# Patient Record
Sex: Male | Born: 1935
Health system: Southern US, Community
[De-identification: ages and names within clinical notes are randomized; demographics above are authoritative.]

## PROBLEM LIST (undated history)

## (undated) DIAGNOSIS — I714 Abdominal aortic aneurysm, without rupture, unspecified: Secondary | ICD-10-CM

## (undated) DIAGNOSIS — Z9289 Personal history of other medical treatment: Secondary | ICD-10-CM

## (undated) DIAGNOSIS — Z87442 Personal history of urinary calculi: Secondary | ICD-10-CM

## (undated) DIAGNOSIS — D696 Thrombocytopenia, unspecified: Secondary | ICD-10-CM

## (undated) DIAGNOSIS — I10 Essential (primary) hypertension: Secondary | ICD-10-CM

## (undated) DIAGNOSIS — G4733 Obstructive sleep apnea (adult) (pediatric): Principal | ICD-10-CM

## (undated) DIAGNOSIS — I480 Paroxysmal atrial fibrillation: Secondary | ICD-10-CM

## (undated) DIAGNOSIS — N200 Calculus of kidney: Secondary | ICD-10-CM

## (undated) DIAGNOSIS — I779 Disorder of arteries and arterioles, unspecified: Secondary | ICD-10-CM

## (undated) DIAGNOSIS — IMO0002 Reserved for concepts with insufficient information to code with codable children: Secondary | ICD-10-CM

## (undated) DIAGNOSIS — I739 Peripheral vascular disease, unspecified: Secondary | ICD-10-CM

## (undated) DIAGNOSIS — M1990 Unspecified osteoarthritis, unspecified site: Secondary | ICD-10-CM

## (undated) DIAGNOSIS — L299 Pruritus, unspecified: Secondary | ICD-10-CM

## (undated) DIAGNOSIS — R9430 Abnormal result of cardiovascular function study, unspecified: Secondary | ICD-10-CM

## (undated) DIAGNOSIS — Z951 Presence of aortocoronary bypass graft: Secondary | ICD-10-CM

## (undated) DIAGNOSIS — H9190 Unspecified hearing loss, unspecified ear: Secondary | ICD-10-CM

## (undated) DIAGNOSIS — I639 Cerebral infarction, unspecified: Secondary | ICD-10-CM

## (undated) DIAGNOSIS — K219 Gastro-esophageal reflux disease without esophagitis: Secondary | ICD-10-CM

## (undated) DIAGNOSIS — E785 Hyperlipidemia, unspecified: Secondary | ICD-10-CM

## (undated) DIAGNOSIS — J189 Pneumonia, unspecified organism: Secondary | ICD-10-CM

## (undated) DIAGNOSIS — I2510 Atherosclerotic heart disease of native coronary artery without angina pectoris: Secondary | ICD-10-CM

## (undated) DIAGNOSIS — J449 Chronic obstructive pulmonary disease, unspecified: Secondary | ICD-10-CM

## (undated) DIAGNOSIS — C61 Malignant neoplasm of prostate: Secondary | ICD-10-CM

## (undated) DIAGNOSIS — R5383 Other fatigue: Secondary | ICD-10-CM

## (undated) DIAGNOSIS — E119 Type 2 diabetes mellitus without complications: Secondary | ICD-10-CM

## (undated) DIAGNOSIS — R42 Dizziness and giddiness: Secondary | ICD-10-CM

## (undated) HISTORY — PX: HERNIA REPAIR: SHX51

## (undated) HISTORY — DX: Abdominal aortic aneurysm, without rupture, unspecified: I71.40

## (undated) HISTORY — DX: Thrombocytopenia, unspecified: D69.6

## (undated) HISTORY — DX: Paroxysmal atrial fibrillation: I48.0

## (undated) HISTORY — DX: Atherosclerotic heart disease of native coronary artery without angina pectoris: I25.10

## (undated) HISTORY — PX: ABDOMINAL AORTIC ANEURYSM REPAIR: SUR1152

## (undated) HISTORY — DX: Pruritus, unspecified: L29.9

## (undated) HISTORY — DX: Cerebral infarction, unspecified: I63.9

## (undated) HISTORY — DX: Other disorders of bilirubin metabolism: E80.6

## (undated) HISTORY — DX: Hyperlipidemia, unspecified: E78.5

## (undated) HISTORY — DX: Other fatigue: R53.83

## (undated) HISTORY — DX: Peripheral vascular disease, unspecified: I73.9

## (undated) HISTORY — PX: PROSTATE BIOPSY: SHX241

## (undated) HISTORY — DX: Dizziness and giddiness: R42

## (undated) HISTORY — DX: Abnormal result of cardiovascular function study, unspecified: R94.30

## (undated) HISTORY — DX: Reserved for concepts with insufficient information to code with codable children: IMO0002

## (undated) HISTORY — DX: Malignant neoplasm of prostate: C61

## (undated) HISTORY — DX: Chronic obstructive pulmonary disease, unspecified: J44.9

## (undated) HISTORY — PX: OTHER SURGICAL HISTORY: SHX169

## (undated) HISTORY — PX: FEMORAL ARTERY ANEURYSM REPAIR: SUR1157

## (undated) HISTORY — DX: Essential (primary) hypertension: I10

## (undated) HISTORY — DX: Obstructive sleep apnea (adult) (pediatric): G47.33

## (undated) HISTORY — DX: Abdominal aortic aneurysm, without rupture: I71.4

## (undated) HISTORY — PX: UMBILICAL HERNIA REPAIR: SHX196

## (undated) HISTORY — DX: Disorder of arteries and arterioles, unspecified: I77.9

## (undated) HISTORY — DX: Presence of aortocoronary bypass graft: Z95.1

---
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
## 1999-06-05 ENCOUNTER — Encounter: Payer: Self-pay | Admitting: Vascular Surgery

## 1999-06-06 ENCOUNTER — Encounter: Payer: Self-pay | Admitting: Vascular Surgery

## 1999-06-06 ENCOUNTER — Ambulatory Visit: Admission: RE | Admit: 1999-06-06 | Discharge: 1999-06-06 | Payer: Self-pay | Admitting: Vascular Surgery

## 1999-06-26 ENCOUNTER — Encounter (INDEPENDENT_AMBULATORY_CARE_PROVIDER_SITE_OTHER): Payer: Self-pay | Admitting: Specialist

## 1999-06-26 ENCOUNTER — Inpatient Hospital Stay: Admission: RE | Admit: 1999-06-26 | Discharge: 1999-06-30 | Payer: Self-pay | Admitting: Vascular Surgery

## 1999-06-26 ENCOUNTER — Encounter: Payer: Self-pay | Admitting: Vascular Surgery

## 1999-06-26 ENCOUNTER — Encounter: Payer: Self-pay | Admitting: Anesthesiology

## 1999-08-16 ENCOUNTER — Other Ambulatory Visit: Admission: RE | Admit: 1999-08-16 | Discharge: 1999-08-16 | Payer: Self-pay | Admitting: Urology

## 2000-03-11 ENCOUNTER — Encounter: Admission: RE | Admit: 2000-03-11 | Discharge: 2000-06-09 | Payer: Self-pay | Admitting: Radiation Oncology

## 2001-12-23 ENCOUNTER — Inpatient Hospital Stay (HOSPITAL_COMMUNITY): Admission: EM | Admit: 2001-12-23 | Discharge: 2001-12-31 | Payer: Self-pay | Admitting: Emergency Medicine

## 2001-12-23 ENCOUNTER — Encounter (INDEPENDENT_AMBULATORY_CARE_PROVIDER_SITE_OTHER): Payer: Self-pay | Admitting: *Deleted

## 2001-12-23 ENCOUNTER — Encounter: Payer: Self-pay | Admitting: Cardiology

## 2001-12-23 HISTORY — PX: CHOLECYSTECTOMY: SHX55

## 2001-12-23 HISTORY — PX: ERCP W/ METAL STENT PLACEMENT: SHX1521

## 2001-12-25 ENCOUNTER — Encounter: Payer: Self-pay | Admitting: Cardiology

## 2002-01-14 ENCOUNTER — Ambulatory Visit (HOSPITAL_COMMUNITY): Admission: RE | Admit: 2002-01-14 | Discharge: 2002-01-14 | Payer: Self-pay | Admitting: Gastroenterology

## 2002-01-14 ENCOUNTER — Encounter: Payer: Self-pay | Admitting: Gastroenterology

## 2002-02-08 ENCOUNTER — Encounter: Payer: Self-pay | Admitting: Surgery

## 2002-02-08 ENCOUNTER — Ambulatory Visit (HOSPITAL_COMMUNITY): Admission: RE | Admit: 2002-02-08 | Discharge: 2002-02-08 | Payer: Self-pay | Admitting: Surgery

## 2002-02-09 ENCOUNTER — Ambulatory Visit (HOSPITAL_COMMUNITY): Admission: RE | Admit: 2002-02-09 | Discharge: 2002-02-09 | Payer: Self-pay | Admitting: Gastroenterology

## 2002-02-09 ENCOUNTER — Encounter: Payer: Self-pay | Admitting: Gastroenterology

## 2002-02-12 ENCOUNTER — Encounter: Payer: Self-pay | Admitting: Gastroenterology

## 2002-02-12 ENCOUNTER — Encounter: Payer: Self-pay | Admitting: Emergency Medicine

## 2002-02-12 ENCOUNTER — Inpatient Hospital Stay (HOSPITAL_COMMUNITY): Admission: EM | Admit: 2002-02-12 | Discharge: 2002-02-15 | Payer: Self-pay | Admitting: Emergency Medicine

## 2002-04-13 ENCOUNTER — Ambulatory Visit (HOSPITAL_COMMUNITY): Admission: RE | Admit: 2002-04-13 | Discharge: 2002-04-13 | Payer: Self-pay | Admitting: Gastroenterology

## 2002-07-25 ENCOUNTER — Ambulatory Visit (HOSPITAL_COMMUNITY): Admission: RE | Admit: 2002-07-25 | Discharge: 2002-07-25 | Payer: Self-pay | Admitting: Gastroenterology

## 2002-07-25 ENCOUNTER — Encounter (INDEPENDENT_AMBULATORY_CARE_PROVIDER_SITE_OTHER): Payer: Self-pay | Admitting: Specialist

## 2002-09-25 HISTORY — PX: INCISIONAL HERNIA REPAIR: SHX193

## 2002-10-12 ENCOUNTER — Encounter: Payer: Self-pay | Admitting: Surgery

## 2002-10-17 ENCOUNTER — Inpatient Hospital Stay (HOSPITAL_COMMUNITY): Admission: RE | Admit: 2002-10-17 | Discharge: 2002-10-20 | Payer: Self-pay | Admitting: Surgery

## 2004-08-25 HISTORY — PX: INGUINAL HERNIA REPAIR: SUR1180

## 2004-08-29 ENCOUNTER — Observation Stay (HOSPITAL_COMMUNITY): Admission: RE | Admit: 2004-08-29 | Discharge: 2004-08-30 | Payer: Self-pay | Admitting: Surgery

## 2006-03-28 ENCOUNTER — Emergency Department (HOSPITAL_COMMUNITY): Admission: EM | Admit: 2006-03-28 | Discharge: 2006-03-28 | Payer: Self-pay | Admitting: Emergency Medicine

## 2007-08-26 HISTORY — PX: MEDIAL PARTIAL KNEE REPLACEMENT: SHX5965

## 2008-10-15 ENCOUNTER — Emergency Department (HOSPITAL_COMMUNITY): Admission: EM | Admit: 2008-10-15 | Discharge: 2008-10-15 | Payer: Self-pay | Admitting: *Deleted

## 2009-04-10 ENCOUNTER — Encounter: Admission: RE | Admit: 2009-04-10 | Discharge: 2009-05-24 | Payer: Self-pay | Admitting: Family Medicine

## 2010-04-08 ENCOUNTER — Emergency Department (HOSPITAL_COMMUNITY): Admission: EM | Admit: 2010-04-08 | Discharge: 2010-04-08 | Payer: Self-pay | Admitting: Emergency Medicine

## 2010-10-01 ENCOUNTER — Other Ambulatory Visit: Payer: Self-pay | Admitting: Gastroenterology

## 2010-11-08 LAB — URINALYSIS, ROUTINE W REFLEX MICROSCOPIC
Leukocytes, UA: NEGATIVE
Nitrite: NEGATIVE
Protein, ur: 30 mg/dL — AB

## 2010-11-08 LAB — URINE MICROSCOPIC-ADD ON

## 2010-11-08 LAB — DIFFERENTIAL
Basophils Absolute: 0 10*3/uL (ref 0.0–0.1)
Basophils Relative: 0 % (ref 0–1)
Eosinophils Absolute: 0.1 10*3/uL (ref 0.0–0.7)
Lymphocytes Relative: 11 % — ABNORMAL LOW (ref 12–46)

## 2010-11-08 LAB — POCT I-STAT, CHEM 8
BUN: 19 mg/dL (ref 6–23)
Creatinine, Ser: 1.1 mg/dL (ref 0.4–1.5)
Sodium: 141 mEq/L (ref 135–145)

## 2010-11-08 LAB — CBC
Hemoglobin: 15.5 g/dL (ref 13.0–17.0)
MCH: 32 pg (ref 26.0–34.0)
MCHC: 34.7 g/dL (ref 30.0–36.0)
MCV: 92.4 fL (ref 78.0–100.0)
RBC: 4.84 MIL/uL (ref 4.22–5.81)
RDW: 13.7 % (ref 11.5–15.5)

## 2010-11-13 ENCOUNTER — Emergency Department (HOSPITAL_COMMUNITY)
Admission: EM | Admit: 2010-11-13 | Discharge: 2010-11-13 | Disposition: A | Discharge status: Home / Self Care | Payer: Medicare Other | Attending: Emergency Medicine | Admitting: Emergency Medicine

## 2010-11-13 DIAGNOSIS — L270 Generalized skin eruption due to drugs and medicaments taken internally: Secondary | ICD-10-CM | POA: Insufficient documentation

## 2010-11-13 DIAGNOSIS — L2989 Other pruritus: Secondary | ICD-10-CM | POA: Insufficient documentation

## 2010-11-13 DIAGNOSIS — L298 Other pruritus: Secondary | ICD-10-CM | POA: Insufficient documentation

## 2010-11-13 DIAGNOSIS — T466X5A Adverse effect of antihyperlipidemic and antiarteriosclerotic drugs, initial encounter: Secondary | ICD-10-CM | POA: Insufficient documentation

## 2010-11-13 DIAGNOSIS — Z87891 Personal history of nicotine dependence: Secondary | ICD-10-CM | POA: Insufficient documentation

## 2010-11-13 DIAGNOSIS — R21 Rash and other nonspecific skin eruption: Secondary | ICD-10-CM | POA: Insufficient documentation

## 2010-11-28 ENCOUNTER — Encounter: Payer: Self-pay | Admitting: Nurse Practitioner

## 2010-11-28 DIAGNOSIS — I10 Essential (primary) hypertension: Secondary | ICD-10-CM

## 2010-11-28 DIAGNOSIS — E119 Type 2 diabetes mellitus without complications: Secondary | ICD-10-CM | POA: Insufficient documentation

## 2010-11-28 DIAGNOSIS — R42 Dizziness and giddiness: Secondary | ICD-10-CM

## 2010-11-28 DIAGNOSIS — Z8546 Personal history of malignant neoplasm of prostate: Secondary | ICD-10-CM

## 2010-12-10 LAB — DIFFERENTIAL
Basophils Relative: 0 % (ref 0–1)
Eosinophils Absolute: 0.1 10*3/uL (ref 0.0–0.7)
Lymphocytes Relative: 8 % — ABNORMAL LOW (ref 12–46)
Lymphs Abs: 0.8 10*3/uL (ref 0.7–4.0)
Monocytes Absolute: 0.8 10*3/uL (ref 0.1–1.0)

## 2010-12-10 LAB — URINE CULTURE: Colony Count: >=100000

## 2010-12-10 LAB — URINE MICROSCOPIC-ADD ON

## 2010-12-10 LAB — URINALYSIS, ROUTINE W REFLEX MICROSCOPIC
Glucose, UA: 100 mg/dL — AB
Hgb urine dipstick: NEGATIVE
Nitrite: NEGATIVE
Protein, ur: NEGATIVE mg/dL

## 2010-12-10 LAB — BASIC METABOLIC PANEL
CO2: 29 mEq/L (ref 19–32)
Calcium: 9.8 mg/dL (ref 8.4–10.5)
Chloride: 105 mEq/L (ref 96–112)
Potassium: 4.1 mEq/L (ref 3.5–5.1)
Sodium: 138 mEq/L (ref 135–145)

## 2010-12-10 LAB — CBC
HCT: 44.6 % (ref 39.0–52.0)
MCHC: 35.2 g/dL (ref 30.0–36.0)
Platelets: 110 10*3/uL — ABNORMAL LOW (ref 150–400)
RDW: 13 % (ref 11.5–15.5)

## 2011-01-10 NOTE — Op Note (Signed)
   NAME:  [name], [name]                          ACCOUNT NO.:  [id number]   MEDICAL RECORD NO.:  [id number]                   PATIENT TYPE:  AMB   LOCATION:  ENDO                                 FACILITY:  [facility]   PHYSICIAN:  Petra Kuba, M.D.                 DATE OF BIRTH:  [birth year]   DATE OF PROCEDURE:  04/13/2002  DATE OF DISCHARGE:  04/13/2002                                 OPERATIVE REPORT   PROCEDURE:  Esophagogastroduodenoscopy with the side-viewing scope and stent  removal.   INDICATIONS:  Patient with bile leak, status post biliary stenting.  I just  want to remove stent and see how he does.  Consent was signed after risks,  benefits, methods, and options thoroughly discussed by me on multiple  occasions with both the patient and his other family members as well as by  Dr. Gerrit Friends.   MEDICATIONS:  Demerol 60 mg, Versed 7 mg.   DESCRIPTION OF PROCEDURE:  The side-viewing therapeutic video duodenoscope  was inserted by indirect vision into the stomach in the customary fashion  and advanced through a normal antrum, normal pylorus, into a normal  duodenum, and the properly-positioned stent was pulled into view.  It did  seem to be semi-clogged, but bile was draining around it and mildly through  it as well.  We went ahead and snared it in the customary fashion.  The  stent was withdrawn through the bile duct, could not be, based on the angle  we snared it, withdrawn into the scope, so we elected to withdraw the stent,  snare, and scope all in one.  The stent was recovered.  The patient  tolerated the procedure well.  There was no obvious immediate complication.   ENDOSCOPIC DIAGNOSES:  1. Stent in seemingly normal position, questionably partially clogged,     status post snare removal.  2. No obvious other findings using the side-viewing ERCP scope.   PLAN:  Observe for delayed complications.  Will review customary warnings.  Otherwise, follow up in my office in two to  three months to recheck symptoms  and discuss colonic screening, which he was initially referred to me in the  first place for, just prior to his gallbladder incident.                                               Petra Kuba, M.D.    MEM/MEDQ  D:  04/13/2002  T:  04/16/2002  Job:  [id number]   cc:   Velora Heckler, M.D.  Fax: [phone]   Ernestina Penna, M.D.

## 2011-01-10 NOTE — Procedures (Signed)
[facility]  Patient:    [name], [name] Visit Number: [id number] MRN: [id number]          Service Type: END Location: ENDO Attending Physician:  Nelda Marseille Dictated by:   Petra Kuba, M.D. Proc. Date: 02/09/02 Admit Date:  02/09/2002   CC:         Velora Heckler, M.D.   Procedure Report  PROCEDURE:  Stent change.  INDICATION:  The patient with persistent bile leak with x-ray findings of stent migration.  Consent was signed after risks, benefits, methods, and options thoroughly discussed with Mr. Hackler in the past.  MEDICATIONS:  Demerol 100, Versed 9.  DESCRIPTION OF PROCEDURE:  The side-viewing video therapeutic duodenoscope was inserted by indirect vision into the stomach and advanced through a normal antrum, normal pylorus, into the duodenum, and the old stent was brought into view.  It had migrated distally with a half or more in the duodenum.  We went ahead and snared it and withdrew it through the scope.  Using the triple-lumen sphincterotome, we proceeded with cannulation.  On the first injection, we had a normal-appearing PD.  Once we realized it was the PD, we stopped the injection.  The sphincterotome was repositioned.  Selective CBD cannulation was obtained using JAG Jagwire, we were able to get deep, selective cannulation.  We did not overfill the duct but, on some injection of contrast, the CBD appeared normal, and we could not demonstrate the leak.  We went ahead and removed the sphincterotome, and over the JAG Jagwire in the customary fashion, placed an 11.5 French 7 cm stent, using the Oasis system in the customary fashion with good positioning both endoscopically and fluoroscopically of the stent.  The introducer and wire were removed.  There was good drainage seen.  The scope was removed.  The patient tolerated the procedure well.  There was no obvious immediate complication.  ENDOSCOPIC DIAGNOSES: 1. Stent  slight distal migration, removed with the snare. 2. Normal ampulla pancreas divisum on one injection. 3. Normal common bile duct without obvious leak but not overfilled the bile    duct, and intrahepatics not filled. 4. Status post stent placement using the 11.5 French 7 cm plastic stent using    the Oasis system.  PLAN:  Observe for delayed complications.  If none, and drainage decreases, drain removed per Dr. Gerrit Friends and then remove stent in roughly six weeks. Happy to see back sooner p.r.n., and the customary warnings will be discussed with he and his wife. Dictated by:   Petra Kuba, M.D. Attending Physician:  Nelda Marseille DD:  02/09/02 TD:  02/10/02 Job: [id number]

## 2011-01-10 NOTE — H&P (Signed)
[place]  Patient:    [name], [name] Visit Number: [id number] MRN: [id number]          Service Type: MED Location: [phone] Attending Physician:  Nelda Marseille Dictated by:   Verlin Grills, M.D. Admit Date:  02/12/2002 Discharge Date: 02/15/2002   CC:         Velora Heckler, M.D.  Petra Kuba, M.D.   History and Physical  ADMISSION PROBLEM:  Post ERCP pancreatitis plus/minus cholangitis.  HISTORY:  The patient is a 77 year old male born July 24, 1933.  On Dec 26, 2001 the patient underwent a laparotomy with open cholecystectomy to remove a gangrenous gallbladder, filled with stones; the procedure was complicated by a bile leak, which was controlled with placement of a Jackson-Pratt drain.  The patient was discharged from [facility] on Dec 31, 2001.  On Jan 14, 2002 Dr. Leary Roca performed an endoscopic retrograde cholangiopancreatography, which revealed a normal pancreatic rim and ? bile leak at the cystic duct stump.  A 10-French 7 cm biliary stent was placed without sphincterotomy.  On February 10, 2002 the biliary stent was changed by Dr. Leary Roca at [facility].  Following the procedure the patient developed generalized abdominal cramps and nausea and vomiting, which was associated with abdominal distention.  The patient was directed to the [facility] Emergency Room for evaluation.  MEDICATION ALLERGIES:  ? PENICILLIN, ? ERYTHROMYCIN.  CHRONIC MEDICATIONS:  Aspirin, Arthrotek, meclizine, Aciphex, Pletyl.  PAST MEDICAL HISTORY: 1. Prostate cancer, treated with external beam radiation July 2001. 2. Gastroesophageal reflux disease. 3. Arthritis. 4. Hypertension. 5. Abdominal aortic aneurysm repair. 6. Right aortoiliac bypass. 7. Open cholecystectomy, Dec 25, 2001. 8. Endoscopic biliary stent placement, Jan 14, 2002. 9. Endoscopic biliary stent exchange, February 10, 2002.  HABITS:  The patient does not smoke cigarettes or consume alcohol.  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  PHYSICAL EXAMINATION:  GENERAL:  The patient appears alert and comfortable, lying on the stretcher in the emergency room.  HEENT:  Sclerae not icteric.  Conjunctivae normal.  Oropharynx normal.  LUNGS:  Clear to auscultation.  CARDIAC:  Regular rhythm without murmurs.  ABDOMEN:  Moderate distention, no pain to palpation.  Jackson-Pratt drain in the right upper quadrant.  EXTREMITIES:  No edema.  ASSESSMENT: 1. Abdominal cramps with vomiting, post ERCP; February 10, 2002. 2. Dec 26, 2001 laparotomy with cholecystectomy, complicated by bile leak. 3. Jan 14, 2002 endoscopic biliary stent placement; February 10, 2002    endoscopic biliary stent exchange.  DIFFERENTIAL DIAGNOSES: 1. Partial small bowel obstruction. 2. Biliary stent migration. 3. Biliary stent occlusion. 4. Post-ERCP pancreatitis. 5. Post-ERCP cholangitis. 6. Retroperitoneal perforation. Dictated by:   Verlin Grills, M.D. Attending Physician:  Nelda Marseille DD:  02/13/02 TD:  02/14/02 Job: [id number]

## 2011-01-10 NOTE — Discharge Summary (Signed)
[place]. [facility]  Patient:    [name], [name] Visit Number: [id number] MRN: [id number]          Service Type: END Location: ENDO Attending Physician:  Nelda Marseille Dictated by:   Velora Heckler, M.D. Admit Date:  01/14/2002 Discharge Date: 01/14/2002   CC:         Anselmo Rod, M.D.  Rollene Rotunda, M.D. [facility]  Petra Kuba, M.D.   Discharge Summary  REASON FOR ADMISSION:  Chest pain.  HISTORY OF PRESENT ILLNESS:  Patient is a 77 year old gentleman admitted by [facility] for substernal and epigastric chest pain.  Patient underwent a thorough cardiovascular evaluation.  He was essentially ruled out for a cardiac source of pain.  He was noted to have elevated liver function tests.  He was seen in consultation by Dr. Anselmo Rod.  Patient had an abdominal ultrasound which demonstrated sludge in the gallbladder.  General surgery was consulted on Dec 24, 2001.  HOSPITAL COURSE:  Patient was assessed as noted above.  General surgery became involved on Dec 24, 2001. Patient was on intravenous Cipro.  PIPIDA scan was scheduled.  The patient underwent a hepatobiliary scan showing findings consistent with cystic duct obstruction and acute cholecystitis.  Patient was therefore prepared and scheduled for surgery on Dec 26, 2001.  Patient underwent attempted laparoscopic cholecystectomy on May 4.  He was found to have acute gangrenous cholecystitis and cholelithiasis.  He required open cholecystectomy.  Postoperatively, the patient did well.  However, he developed a bile leak.  This was controlled with the Jackson-Pratt drain. Patient made a slow but steady progress.  He was treated with intravenous Primaxin.  Diet was slowly advanced.  The patient was prepared for discharge home on the fifth postoperative day.  DISCHARGE PLAN:  Patient was discharged home Dec 31, 2001, in good condition, tolerating a regular diet, and ambulating  independently.  DISCHARGE MEDICATIONS:  Augmentin 875 mg p.o. b.i.d. for one additional week as well as Vicodin as needed for pain and other medications as per his home routine.  FOLLOW-UP:  The patient will be seen back in my office at [facility] in five days.  Jackson-Pratt drain remains in place.  FINAL PATHOLOGY:  Acute cholecystitis.  FINAL DIAGNOSIS:  Acute gangrenous cholecystitis, cholelithiasis.  CONDITION AT DISCHARGE:  Improved.Dictated by:   Velora Heckler, M.D.  Attending Physician:  Nelda Marseille DD:  02/03/02 TD:  02/06/02 Job: [id number]

## 2011-01-10 NOTE — Op Note (Signed)
NAME:  [name], [name]                          ACCOUNT NO.:  [id number]   MEDICAL RECORD NO.:  [id number]                   PATIENT TYPE:  AMB   LOCATION:  ENDO                                 FACILITY:  [facility]   PHYSICIAN:  Petra Kuba, M.D.                 DATE OF BIRTH:  30-May-1933   DATE OF PROCEDURE:  07/25/2002  DATE OF DISCHARGE:                                 OPERATIVE REPORT   PROCEDURE:  Colonoscopy with polypectomy   INDICATIONS FOR PROCEDURE:  Family history of colon cancer.   PREPROCEDURE PREPARATION:  Consent was signed after risks, benefits,  methods, options to surgery were discussed multiple times in the past.   MEDICINES USED:  Demerol 100 mg and Versed 7 mg.   PROCEDURE:  Rectal inspection produced external hemorrhoids small. Digital  examination was negative.  The video colonoscope was inserted easily  advanced around the colon to the cecum.  This did require abdominal pressure  and rolling him on his back. On insertion some small left sided polyps were  seen but no other abnormalities.  The cecum was identified by the  appendiceal orifice and the ileocecal valve.  The prep was fairly adequate.  Did require a moderate amount of washing and suctioning for adequate  visualization.  On slow withdrawal through the colon, two small ascending  polyps were seen and were hot biopsied. Two transverse were hot biopsied  including the first skin tag.  The descending was fairly free of polyps, but  at the sigmoid and descending junction, a medium sized pedunculated polyp  was seen.  Snare electrocautery providing polyp resection through the scope,  sectioned on the head of the scope and the polyp was recovered by  withdrawing the scope.  The scope was reinserted to the polypectomy site,  which had a nice white coagula and no obvious residual polypoid tissue. The  scope was then slowly withdrawn. That polyp was put in the second container.  Multiple tiny sigmoid and  proximal rectal polyps were seen on slow  withdrawal, which were hot biopsied and put in a third container.  In the  distal rectum, multiple hyperplastic appearing polyps were seen, some of  which were cold and hot biopsied but not all.  The scope was retroflexed  revealing some internal hemorrhoids small. The scope was readvanced a short  way up the left side of the colon. Air was suctioned and scope removed.  The  patient tolerated the procedure well.  There was no obvious immediate  complication.   ENDOSCOPIC DIAGNOSES:  1. Small internal and external hemorrhoids.  2. Multiple polyps with multiple hot and cold biopsies in the rectum,     sigmoid, transverse, and ascending.  3. Sigmoid and descending junction 1 cm polyp status post snare.  4. A few hyperplastic appearing rectal polyps not biopsied.  5. Otherwise within normal limits to the cecum.  PLAN:  Await pathology to determine future colonic screening.  He was asked  to see back p.r.n.; otherwise, return to the care of Dr.  Christell Constant for his  customary health care maintenance to include yearly rectals and guaiacs and  will caution family about two weeks without aspirin or nonsteroidals.                                               Petra Kuba, M.D.    MEM/MEDQ  D:  07/25/2002  T:  07/25/2002  Job:  [id number]   cc:   Ernestina Penna, M.D.  [address] [place]  [place] [address]  Fax: [phone]   Velora Heckler, M.D.  [address] [place]  [place] [address]  Fax: [phone]

## 2011-01-10 NOTE — Op Note (Signed)
[place]. [facility]  Patient:    [name], [name] Visit Number: [id number] MRN: [id number]          Service Type: MED Location: [phone] Attending Physician:  Learta Codding Dictated by:   Velora Heckler, M.D. Proc. Date: 12/26/01 Admit Date:  12/23/2001   CC:         Rollene Rotunda, M.D. [facility]  Lewayne Bunting, M.D. [facility]  Jyothi Elsie Amis, M.D.  Larina Earthly, M.D.   Operative Report  PREOPERATIVE DIAGNOSIS:  Acute cholecystitis.  POSTOPERATIVE DIAGNOSES: 1. Acute gangrenous cholecystitis. 2. Cholelithiasis.  PROCEDURES: 1. Attempted laparoscopic cholecystectomy. 2. Open cholecystectomy.  SURGEON:  Velora Heckler, M.D.  ASSISTANT:  Gita Kudo, M.D.  ANESTHESIA:  General per J. Claybon Jabs, M.D.  ESTIMATED BLOOD LOSS:  Less than 100 cc.  PREPARATION:  Betadine.  COMPLICATIONS:  None.  INDICATION:  The patient is a 77 year old white male admitted with substernal chest pain and epigastric abdominal pain.  After initial cardiology evaluation ruled out acute myocardial process, gastroenterology was consulted.  The patient was noted to have elevate liver function tests.  Ultrasound of the abdomen showed sludge in the gallbladder.  The patient was seen in consultation with general surgery.  Nuclear medicine PIPIDA scan was obtained, which demonstrated acute cystic duct obstruction consistent with acute cholecystitis.  The patient is now prepared and brought to the operating room.  DESCRIPTION OF PROCEDURE:  The procedure was done in OR #16 at the [facility].  The patient was brought to the operating room, placed in a supine position on the operating room table.  Following administration of general anesthesia, the patient was prepped and draped in the usual strict aseptic fashion.  After ascertaining that an adequate level of anesthesia had been obtained, an infraumbilical incision was made with a #15  blade. Dissection was carried down to the fascia.  Fascia is incised in the midline and the peritoneal cavity is entered cautiously.  An 0 Vicryl pursestring suture is placed in the fascia.  A Hasson cannula is introduced under direct vision and secured with a pursestring suture.  The abdomen is insufflated with carbon dioxide.  A laparoscope is introduced under direct vision.  There are omental adhesions to the midline abdominal wound from the patients previous abdominal aortic aneurysm repair.  However, the scope is advanced into the right upper quadrant.  There is a large phlegmon present at the inferior margin of the liver.  Operating port is placed in the midline below the xiphoid process.  Using blunt dissection the omentum is moved off of the gallbladder.  The gallbladder is tense and distended with areas of full-thickness necrosis consistent with acute gangrenous cholecystitis.  After trying to provide adequate laparoscopic exposure, a decision is made to convert to open procedure.  Laparoscopic instruments are withdrawn.  Umbilical port is withdrawn.  Vicryl pursestring suture is tied securely.  Using #10 blade, a right subcostal incision is made.  Dissection is carried through subcutaneous tissues. Muscles are divided with the electrocautery, and the peritoneal cavity is entered cautiously.  The Balfour retractor is placed for exposure.  The gallbladder is exposed.  It is tense, distended, with areas of full-thickness necrosis.  Dissection is begun at the fundus of the gallbladder with the peritoneum being incised.  The entire back wall of the gallbladder is necrotic.  The gallbladder is carefully dissected out of the gallbladder bed using the electrocautery for hemostasis on the  surface of the liver. Dissection is carried distally.  Branches of the cystic artery are divided between ligaclips.  Using blunt dissection the infundibulum of the gallbladder is defined.  The  peritoneum is incised circumferentially.  Dissection is carried down to what appears to be the cystic duct.  The area is quite inflamed, indurated, and somewhat necrotic.  Cystic duct is doubly clipped and then divided sharply with the Metzenbaum scissors.  The gallbladder is excised completely.  Upon opening, it contained brownish, cloudy fluid and a few small flecks of brownish-black particulate matter consistent with gallstones.  The right upper quadrant is irrigated copiously with warm saline, and good hemostasis is assured.  A #10 JP drain is brought in from the lateral stab wound.  It is placed in the gallbladder bed.  It is secured to the skin with 3-0 nylon suture.  All packs are removed.  The abdominal wall is closed with two layers of running #1 PDS suture.  The subcutaneous tissue is irrigated and hemostasis obtained with the electrocautery.  Local Marcaine is injected. Skin incisions are closed with stainless steel stapes.  Wounds are washed and dried and sterile dressings are applied.  Drain is placed to bulb suction. The patient is awakened from anesthesia and brought to the recovery room in stable condition.  The patient tolerated the procedure well. Dictated by:   Velora Heckler, M.D. Attending Physician:  Learta Codding DD:  12/26/01 TD:  12/28/01 Job: [id number]

## 2011-01-10 NOTE — Op Note (Signed)
NAME:  Eckmann, [facility]                ACCOUNT NO.:  [id number]   MEDICAL RECORD NO.:  [id number]          PATIENT TYPE:  AMB   LOCATION:  DAY                          FACILITY:  [facility]   PHYSICIAN:  Velora Heckler, MD      DATE OF BIRTH:  1933/05/02   DATE OF PROCEDURE:  08/29/2004  DATE OF DISCHARGE:                                 OPERATIVE REPORT   PREOPERATIVE DIAGNOSES:  1.  Left inguinal hernia.  2.  Skin tags.   POSTOPERATIVE DIAGNOSES:  1.  Left inguinal hernia.  2.  Skin tags.   PROCEDURES:  1.  Repair of left inguinal hernia with Prolene mesh.  2.  Excise skin tags x 4 from groin.   SURGEON:  Velora Heckler, M.D.   ANESTHESIA:  General.   ESTIMATED BLOOD LOSS:  Minimal.   PREPARATION:  Betadine.   COMPLICATIONS:  None.   INDICATIONS:  The patient is a 77 year old white male, well known to my  practice.  He developed a bulge in the left groin in July of 2005.  He has  not had an signs or symptoms of incarceration or strangulation.  He now  comes to surgery for repair of left inguinal hernia.   DESCRIPTION OF PROCEDURE:  The procedure was done in OR #6 at the [facility]. The patient was brought to the operating room and  placed in the supine position on the operating room table.  Following  administration of general anesthesia, the patient was prepped and draped in  the usual strict aseptic fashion.  After ascertaining that an adequate level  of anesthesia had been obtained, a left inguinal incision was made with a  #15 blade.  Dissection was carried down through the subcutaneous tissues to  the fascia.  Hemostasis was obtained with the electrocautery.  The fascia  was incised in line with its fibers and extended through the external  inguinal ring.  Cord structures were encircled with a Penrose drain.  The  cord was explored.  There was a large indirect inguinal hernia sac  containing sigmoid colon.  This was carefully opened and dissected away  from  the cord structures up to the level of the internal inguinal ring.  Contents  were reduced back within the peritoneal cavity and a high ligation of the  sac was performed with a 2-0 silk suture ligature.  Next, the floor of the  inguinal canal was recreated with a sheet of Prolene mesh.  Mesh was secured  to the pubic tubercle and along the inguinal ligament with a running 2-0  Novofil suture.  Mesh was split to accommodate the cord structures.  The  superior margin of the mesh was secured to the transversalis and internal  oblique fascia with interrupted 2-0 Novofil sutures.  Tails of the mesh were  overlapped laterally and the inferior edge of the tails of the mesh were  secured to the inguinal ligament with an interrupted 2-0 Novofil suture.  Local field block was placed with Marcaine.  Cord structures were returned  to the inguinal canal.  The external oblique fascia was closed with  interrupted 3-0 Vicryl sutures.  The subcutaneous tissues were closed with  interrupted 3-0 Vicryl sutures.  The skin was anesthetized with local  anesthetic.  The skin was closed with a running 4-0 Vicryl subcuticular  suture.  The wound was washed and dried and Benzoin and Steri-Strips were  applied.  Sterile dressings were applied.   Next we turned our attention to the groin.  There was a 1 cm pedunculated  skin tag from the right medial thigh which was excised with the  electrocautery.  There were three subcentimeter skin tags in the left medial  thigh which were also excised with the electrocautery.  These were dressed  with Neosporin and Band-Aids.   The patient was awaken from anesthesia and brought to the recovery room in  stable condition.  The patient tolerated the procedure well.     Todd   TMG/MEDQ  D:  08/29/2004  T:  08/29/2004  Job:  [id number]   cc:   [facility]

## 2011-01-10 NOTE — Discharge Summary (Signed)
   [name], [name]                          ACCOUNT NO.:  [id number]   MEDICAL RECORD NO.:  [id number]                   PATIENT TYPE:  NP   LOCATION:  5504                                 FACILITY:  [facility]   PHYSICIAN:  Petra Kuba, M.D.                 DATE OF BIRTH:  07-02-1933   DATE OF ADMISSION:  02/12/2002  DATE OF DISCHARGE:  02/15/2002                                 DISCHARGE SUMMARY   HISTORY:  The patient was admitted by my partner, Dr. Laural Benes, with a  presumed bout of post ERCP, probably chole angitis.  He had a stent changed  due to probable stent migration due to his bile leak from his  cholecystectomy.  He did have fever and elevated white count was well as  pain, nausea and vomiting.  A CT scan is done which did not have any obvious  abnormalities.  Lab parameters were followed with slow decrease in his liver  tests throughout the hospitalization.  Amylase and lipase were essentially  normal throughout the hospitalization as was his white count.  He was  initially kept n.p.o.  However, on June 23, he was doing better, and we were  able to slowly advance his diet.  Dr. Gerrit Friends evaluated him and agreed with  advancing his diet.  By June 24, the patient was ready for discharge.  There  were no hospitalization complications.   CONDITION ON DISCHARGE:  Improved.   DISCHARGE DIAGNOSES:  1. Probable post ERCP choleangitis.  2. Bile leak.   DISCHARGE MEDICATIONS:  1. Cipro 1 twice a day for one more week.  2. Pain pills as needed.   WOUND CARE:  Per Dr. Gerrit Friends.   ACTIVITY:  Slowly advance as tolerated.   DIET:  Slowly advance as tolerated.   SPECIAL INSTRUCTIONS:  Call Dr. Ewing Schlein if recurrent fever, pain, nausea,  vomiting, or yellow jaundice.  He does have an appointment with Dr. Gerrit Friends  on Friday.  Keep that as scheduled.  I have reminded him to schedule an  appointment for me to remove the stent at the end of July or early August.  He will call  p.r.n.                                               Petra Kuba, M.D.    MEM/MEDQ  D:  03/30/2002  T:  04/04/2002  Job:  [id number]   cc:   Velora Heckler, M.D.  Fax: [phone]

## 2011-01-10 NOTE — Op Note (Signed)
[name], [name]                          ACCOUNT NO.:  [id number]   MEDICAL RECORD NO.:  [id number]                   PATIENT TYPE:  INP   LOCATION:  2889                                 FACILITY:  [facility]   PHYSICIAN:  Velora Heckler, M.D.                DATE OF BIRTH:  [birth year]   DATE OF PROCEDURE:  10/17/2002  DATE OF DISCHARGE:                                 OPERATIVE REPORT   PREOPERATIVE DIAGNOSES:  Multiple incisional herniae.   POSTOPERATIVE DIAGNOSIS:  Multiple incisional herniae.   PROCEDURES:  1. Repair right subcostal incisional hernia with Prolene mesh.  2. Repair infraumbilical incisional hernia with Prolene mesh.   SURGEON:  Velora Heckler, M.D.   ANESTHESIA:  General.   ANESTHESIOLOGIST:  Kaylyn Layer. Michelle Piper, M.D.   ESTIMATED BLOOD LOSS:  Less than 100 cc.   PREPARATION:  Betadine.   COMPLICATIONS:  None.   DRAINS:  Two #10 Blake drains to bulb suction.   INDICATIONS:  The patient is a 77 year old white male who underwent  cholecystectomy in May 2003 for acute gangrenous cholecystitis and  cholelithiasis.  The postoperative course was complicated by wound infection  and bile leak.  The patient has now developed right subcostal incisional  herniae, as well as midline incisional hernia and an incisional hernia at  the site of an infraumbilical wound for laparoscopy.  He now comes to  surgery for repair.   DESCRIPTION OF PROCEDURE:  The procedure was done in OR-10 at the [facility].  The patient is brought to the operating room and  placed in the supine position on the operating room table.  Following the  administration of general anesthesia the patient was prepped and draped in  the usual strict aseptic fashion.  After ascertaining that an adequate level  of anesthesia had been obtained, the right subcostal scar is completely  excised with a #10 blade.  Hemostasis is obtained through electrocautery.  Dissection is carried down  to the fascia.  There are multiple fascial  defects along the extent of the wound.  The wound is reopened in its  entirety, extending from the midline to the right flank.  Adhesions are  lysed circumferentially between mainly omentum and the anterior abdominal  wall.  Muscle flaps are then developed circumferentially by elevating  subcutaneous tissues off of the anterior fascia, for approximately 7 cm  circumferentially around the defect.  The defect is then closed with  interrupted #1 Novofil sutures.   Next, a sheet of Prolene mesh is cut to the appropriate dimension and placed  over the wound.  It overlaps the fascial edges by approximately 5 cm in all  directions.  It is secured to the anterior fascia with interrupted #1  Novofil sutures circumferentially.  The mesh is also secured to the closed  wound along the mid portion of the mesh with interrupted #1  Novofil sutures.  Two subcutaneous fully fluted Blake drains are placed from lateral stab  wounds, and laid anterior to the mesh.  Drains are secured to the skin with  3-0 nylon sutures.  Subcutaneous tissues are closed with running 2-0 Vicryl  suture.  The skin edges are closed with stainless steel staples.  Drains are  placed to bulb suction.   Next, we turned our attention to the infraumbilical incisional hernia.  Using a #10 blade the previous incision was reopened.  Dissection was  carried through the subcutaneous tissues and the hernia sac is entered.  It  contains incarcerated omentum, which is reduced back within the peritoneal  cavity.  Fascial defect measures approximately 2 cm.  The fascial edges are  defined and skin flaps are raised circumferentially.  Defect is closed with  interrupted #1 Novofil sutures.  An approximately 4 x 4 cm sheet of Prolene  mesh is then placed into the wound and secured circumferentially overlying  the fascial closure.  The wound is irrigated and good hemostasis is noted.  Subcutaneous tissues  are closed with interrupted 2-0 Vicryl sutures.  Skin  edges are closed with stainless steel staples.  The wounds are washed and  dried  and sterile dressings are applied.  The patient is awakened from  anesthesia and brought to the recovery room in stable condition.  The  patient tolerated the procedure well.                                               Velora Heckler, M.D.    TMG/MEDQ  D:  10/17/2002  T:  10/17/2002  Job:  [id number]   cc:   Petra Kuba, M.D.  [address]  [place]  [place] [address]  Fax: [phone]

## 2011-01-10 NOTE — Discharge Summary (Signed)
   NAMEGARY, [name]                          ACCOUNT NO.:  [id number]   MEDICAL RECORD NO.:  [id number]                   PATIENT TYPE:  INP   LOCATION:  5703                                 FACILITY:  [facility]   PHYSICIAN:  Velora Heckler, M.D.                DATE OF BIRTH:  12/08/1932   DATE OF ADMISSION:  10/17/2002  DATE OF DISCHARGE:  10/20/2002                                 DISCHARGE SUMMARY   REASON FOR ADMISSION:  Multiple incisional hernia.   BRIEF HISTORY:  The patient is a pleasant 77 year old white male who  underwent cholecystectomy in May, 2003 for acute gangrenous cholecystitis  and cholelithiasis.  Postoperative course was complicated by a wound  infection and a bowel leak.  The patient has developed incisional hernia at  the site of his right subcostal incision.  He also has an incisional hernia  below the umbilicus at the site of laparoscopy.  He also has breakdown of  his previous midline abdominal incision.  He now comes to surgery for  repair.   HOSPITAL COURSE:  The patient was admitted on February 23 and taken directly  to the operating room.  He underwent repair of the right subcostal  incisional hernia with Prolene mesh.  He also underwent repair of the infra-  umbilical incisional hernia with Prolene mesh.  The postoperative course was  uncomplicated.  He began clear liquid diet and it was advanced to a regular  diet.  He was prepared for discharge home on the third postoperative day.   DISCHARGE PLANNING:  The patient was discharged home October 20, 2002 in  good condition, tolerating a regular diet, and ambulating independently.  Two Jackson-Pratt drains remain in place under his right subcostal incision.  The patient has been instructed in drain care.   DISCHARGE MEDICATIONS:  Include Vicodin as needed for pain.   FOLLOW UP:  The patient will return to see me at [facility] in  five days for wound check and possible drain  removal.                                               Velora Heckler, M.D.    TMG/MEDQ  D:  10/20/2002  T:  10/20/2002  Job:  [id number]   cc:   Petra Kuba, M.D.  [address]  [place]  [place] [address]  Fax: [phone]

## 2011-01-10 NOTE — H&P (Signed)
[place]  Patient:    [name] Visit Number: [id number] MRN: [id number]          Service Type: MED Location: [phone] Attending Physician:  Learta Codding Dictated by:   Rollene Rotunda, M.D. [facility] Admit Date:  12/23/2001   CC:         Paulita Cradle, N.P., [facility]   History and Physical  PRIMARY: Paulita Cradle, N.P., [facility].  REASON FOR PRESENTATION: Evaluate patient with chest pain.  HISTORY OF PRESENT ILLNESS: The patient is a pleasant 77-year-old gentleman, with no prior cardiac history.  He does have a history of peripheral vascular disease, as described below.  He presents today for evaluation of chest discomfort.  This happened last night after returning home from a funeral.  He was going to make himself some popcorn but he developed some discomfort and went to bed.  This progressed to be a 7/10 upper epigastric and substernal discomfort.  He had some radiation to his right arm.  This was unlike previous gastroesophageal reflux symptoms.  He did take some Tums and after a while his discomfort started to ease.  He fell asleep but woke in the morning at around 2 a.m. with recurrent discomfort and profuse diaphoresis.  This again slowly resolved after several minutes.  He was advised to come to the emergency room, where he has been essentially pain-free.  The patient did have yesterday some dizziness and felt somewhat fatigued, which was new for him.  He also reports in retrospect that he has had similar symptoms, though mild, with exertion over the past several months.  PAST MEDICAL HISTORY:  1. Peripheral vascular disease, with left popliteal artery occlusion (ABGs     2000 right greater than 1.0, left 0.67).  2. Abdominal aortic aneurysm.  3. Hypertension.  4. Arthritis.  5. Gastroesophageal reflux disease.  6. Prostate cancer, treated with radiation in July  2001.  PAST SURGICAL HISTORY: Abdominal aortic aneurysm repair by Dr. Arbie Cookey, November 2000, with reimplantation of right renal artery and right aortoiliac bypass.  ALLERGIES:  1. PENICILLIN.  2. Questionable ERYTHROMYCIN.  MEDICATIONS:  1. Arthrotec 50/200 mg every other day.  2. Meclizine 12.5 mg p.r.n.  3. Aciphex 20 mg every other day.  4. Aspirin.  5. Pletal 100 mg every other day.  SOCIAL HISTORY: [place]. 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  He has been under stress because of these.  He quit smoking in 2000.  Prior to this he smoked two packs per day for 50 years.  FAMILY HISTORY: Contributory for his mother dying of a myocardial infarction at age 67 and a brother with myocardial infarction at age 54, still living.  REVIEW OF SYSTEMS: Positive for occasional dizziness, claudication in the left leg, hematuria that he noted this a.m., urinary frequency, joint pains in his shoulders and his knees.  Otherwise as stated in the HPI, negative for all other systems.  PHYSICAL EXAMINATION:  GENERAL: The patient is in no distress.  VITAL SIGNS: Temperature 101 degrees in the ER, pulse 72, blood pressure 110/64 both arms.  HEENT: Eyelids unremarkable.  PERRL.  Fundi not visualized.  Oral mucosa unremarkable.  NECK: No jugular venous distention.  Wave form within normal limits.  Carotid upstrokes brisk and symmetric.  No bruits, no thyromegaly.  LYMPHATICS: No cervical, axillary, or inguinal adenopathy.  LUNGS: Clear to auscultation bilaterally.  BACK: No costovertebral angle tenderness.  CHEST: Unremarkable.  HEART: PMI not displaced or sustained.  S1 and S2 within normal limits.  No S3.  No S4.  No murmurs.  ABDOMEN: Obese.  Positive bowel sounds, normal in frequency and pitch. Well-healed surgical scar.  No rebound, no guarding.  No midline pulsatile mass.  No bruits.  No  hepatomegaly, splenomegaly.  SKIN: No rashes, no nodules.  EXTREMITIES: Upper pulses 2+, 2+ femorals, 2+ dorsalis pedis and posterior tibialis on the right; absent dorsalis pedis and posterior tibialis on the left.  No cyanosis, no clubbing.  NEUROLOGIC: Oriented to person, place, and time.  Cranial nerves II-XII grossly intact.  Motor grossly intact.  LABORATORY DATA: EKG, sinus rhythm, axis within normal limits, intervals within normal limits; no acute ST-T wave changes.  WBC 9.6, hemoglobin 15.6, platelets 129,000.  INR 1.1.  Sodium 138, potassium 3.4, BUN 17, creatinine 1.1, AST 167, ALT 242, alkaline phosphatase 117, total bilirubin 4.8.  D-dimer 2.22.  Troponin 0.01.  Chest x-ray, no acute air space disease, bony abnormalities.  ASSESSMENT/PLAN:  1. Chest discomfort.  The patient has multiple cardiovascular risk factors.     I would typically suspect unstable angina.  However, the elevated AST and     ALT and elevated WBC suggest cholecystitis.  He will be evaluated with an     abdominal ultrasound.  We will continue to rule him out.  At the least I     would consider stress perfusion imaging (particularly if he needs a     cholecystectomy).  If there is no real evidence of acute cholecystitis,     then I might suggest proceeding with catheterization.  Regardless, he     needs aggressive secondary risk factors modification.  2. Hypertension.  His blood pressure is well controlled.  This apparently     has not been an issue in a while.  3. Risk reduction.  The patient should be on a statin based on the results of     the heart protection study and given his peripheral vascular disease.     However, will need to defer this until his liver enzymes normalize.  4. Hypokalemia.  This will be repleted.  5. Reduced platelets.  This will be followed with repeat studies.  6. Obesity.  The patient is limited by his claudication.  However, will     need to educate him about this and  counsel him on diet.  Dictated by:   Rollene Rotunda, M.D. [facility] Attending Physician:  Learta Codding DD:  12/23/01 TD:  12/24/01 Job: [id number]

## 2011-01-10 NOTE — Op Note (Signed)
[place]  Patient:    [name], [place] Visit Number: [id number] MRN: [id number]          Service Type: END Location: ENDO Attending Physician:  Nelda Marseille Dictated by:   Petra Kuba, M.D. Proc. Date: 01/14/02 Admit Date:  01/14/2002 Discharge Date: 01/14/2002   CC:         Velora Heckler, M.D.  Monica Becton, M.D.   Operative Report  PROCEDURE:  Endoscopic retrograde cholangiopancreatography with stent placement.  GASTROENTEROLOGIST:  Petra Kuba, M.D.  INDICATION:  Bile leak.  Consent was signed after risks, benefits, methods and options were thoroughly discussed with Mr. Postiglione and his wife yesterday in the office.  MEDICINES USED:  Demerol 100, Versed 10.  DESCRIPTION OF PROCEDURE:  The side-viewing therapeutic video duodenoscope was inserted by indirect vision into the stomach, advanced through a normal pylorus into the duodenal bulb.  He had a small ampulla which was normal appearing.  Based on doing the procedure on his side, it was a little more difficult to find than usual.  We did that based on his open incision and discomfort when lying on his belly.  Using the triple lumen sphincterotome, we were able to obtain cannulation.  The first two injections did reveal a normal pancreatic duct.  We did not overfill it. Once we realized it was the pancreatic duct, the sphincterotome was repositioned, and we were able to get cannulation in the common bile duct.  On initial injection, no obvious stones were seen.  Using the JAG wire, we were able to get the selective cannulation possibly with a little more filling of the duct.  A slight leak of contrast around the clips on the cystic duct were seen, but we did not overfill the duct but cannot be sure.  We removing the sphincterotome, making sure to keep the wire deep in the intrahepatic.  Using the [facility] system, a 10-French 7 cm stent was placed in the customary  fashion with good bile drainage and good contrast drainage without obvious complication.  The introducer and wire were removed.  The stent was in the proper location.  The scope removed.  The patient tolerated the procedure well.  There was no obvious immediate complication.  ENDOSCOPIC DIAGNOSES: 1. Normal, small-appearing ampulla. 2. Normal pancreatic duct on two injections, not overfilled. 3. Normal common bile duct, questionable tiny leak at the clips but difficult    to say for sure because of not wanting to overfill the ducts. 4. Status post 10-French 7 cm stent placed using the [facility] system.  PLAN:  Observe for delayed complications.  Will watch here in endoscopy for two hours; then, if no problems, can go home today.  We will remove the stent p.r.n. or exchange it if need be.  Otherwise, six to eight weeks after his drainage stops, will be happy to remove and happy to see back sooner p.r.n. Dictated by:   Petra Kuba, M.D. Attending Physician:  Nelda Marseille DD:  01/14/02 TD:  01/17/02 Job: [id number]

## 2011-06-06 ENCOUNTER — Telehealth: Payer: Self-pay | Admitting: *Deleted

## 2011-06-06 NOTE — Telephone Encounter (Signed)
Per telephone call from Paulita Cradle with [facility].  Pt has reported waking during the night for 2 nights with chest pain.  According to Lupita Leash - pt has had some EKG changes with a questionable 1st degree AV block.  He has a family history but no personal history of CAD.  He also has renal insuffiencey and DM.  The only complaint the pt reports at this time is occasional lightheadedness however this is not new for him.  Per Lupita Leash - the pt looks good to her right now but would like him scheduled to see a cardiologist for evaluation.  Pt given an appt with Dr Jerral Bonito 10-16 at 11:30.  Joyce Gross is aware.  I did instruct Lupita Leash to have the pt call 911 and report to the ED if chest pain reoccurs.

## 2011-06-10 ENCOUNTER — Encounter: Payer: Self-pay | Admitting: Cardiology

## 2011-06-11 ENCOUNTER — Encounter: Payer: Self-pay | Admitting: Cardiology

## 2011-06-11 ENCOUNTER — Telehealth: Payer: Self-pay | Admitting: Cardiology

## 2011-06-11 ENCOUNTER — Ambulatory Visit (INDEPENDENT_AMBULATORY_CARE_PROVIDER_SITE_OTHER): Payer: Medicare Other | Admitting: Cardiology

## 2011-06-11 DIAGNOSIS — C61 Malignant neoplasm of prostate: Secondary | ICD-10-CM | POA: Insufficient documentation

## 2011-06-11 DIAGNOSIS — R7309 Other abnormal glucose: Secondary | ICD-10-CM

## 2011-06-11 DIAGNOSIS — I714 Abdominal aortic aneurysm, without rupture, unspecified: Secondary | ICD-10-CM

## 2011-06-11 DIAGNOSIS — R42 Dizziness and giddiness: Secondary | ICD-10-CM | POA: Insufficient documentation

## 2011-06-11 DIAGNOSIS — I10 Essential (primary) hypertension: Secondary | ICD-10-CM

## 2011-06-11 DIAGNOSIS — R079 Chest pain, unspecified: Secondary | ICD-10-CM

## 2011-06-11 DIAGNOSIS — I639 Cerebral infarction, unspecified: Secondary | ICD-10-CM | POA: Insufficient documentation

## 2011-06-11 DIAGNOSIS — R5383 Other fatigue: Secondary | ICD-10-CM | POA: Insufficient documentation

## 2011-06-11 DIAGNOSIS — E785 Hyperlipidemia, unspecified: Secondary | ICD-10-CM | POA: Insufficient documentation

## 2011-06-11 DIAGNOSIS — R519 Headache, unspecified: Secondary | ICD-10-CM | POA: Insufficient documentation

## 2011-06-11 DIAGNOSIS — R51 Headache: Secondary | ICD-10-CM | POA: Insufficient documentation

## 2011-06-11 DIAGNOSIS — I119 Hypertensive heart disease without heart failure: Secondary | ICD-10-CM | POA: Insufficient documentation

## 2011-06-11 NOTE — Assessment & Plan Note (Signed)
The patient has known vascular disease.  He also has other risk factors for coronary disease.  Considering these factors we certainly need to be careful and complete with evaluation of his chest discomfort.  At this point I'm not sure that his chest discomfort was angina.  However further evaluation is needed.  We will proceed with a Lexiscan nuclear study.  I will then see him back for followup.

## 2011-06-11 NOTE — Patient Instructions (Addendum)
Your physician recommends that you schedule a follow-up appointment in: 1-2 WEEKS WITH DR Myrtis Ser  Your physician recommends that you continue on your current medications as directed. Please refer to the Current Medication list given to you today. Your physician has requested that you have a lexiscan myoview. For further information please visit [email or web address] Please follow instruction sheet, as given. DX CHEST PAIN  Your physician has requested that you have an abdominal aorta duplex. During this test, an ultrasound is used to evaluate the aorta. Allow 30 minutes for this exam. Do not eat after midnight the day before and avoid carbonated beverages DX AAA REPAIR

## 2011-06-11 NOTE — Assessment & Plan Note (Signed)
Patient tells me that he's had elevated glucose in the past.  Medication was recommended but he decided not to take it.  He certainly needs to follow up with his primary physician concerning this.

## 2011-06-11 NOTE — Assessment & Plan Note (Signed)
The patient had abdominal aortic aneurysm repair in 2000.  I will look into what type of followup he is at to see if it is now time to do a followup repeat ultrasound.

## 2011-06-11 NOTE — Assessment & Plan Note (Signed)
At this point the patient's headaches did not appear to be severe.  I've asked him to followup with his primary physician

## 2011-06-11 NOTE — Telephone Encounter (Signed)
Pt returning your call

## 2011-06-11 NOTE — Progress Notes (Signed)
HPI The patient is seen for the assessment of chest discomfort.  He is referred for this evaluation.  There is no prior documented history of coronary artery disease.  The patient does have vascular disease.  He had an abdominal aortic aneurysm repaired surgically in 2000.  There is also a history of a CVA in the past documented by an abnormal MRI.  He does not have any residual from this.  He is active.  He has hypertension.  He says that his glucose was elevated but he decided not to take medications.  Last Wednesday he had some chest discomfort.  There was no nausea vomiting or diaphoresis.  He had this again on Thursday.  This was nonexertional.  Over the past weekend in fact he carried a 50 pound bag.  He did not have chest pain.  He did have some shortness of breath.  He was seen by his primary physician last week after his chest pain and his EKG revealed no diagnostic change.  The patient has recently had a headache.  This has not been severe but is unusual for him.  Allergies  Allergen Reactions  . Toradol     Current Outpatient Prescriptions  Medication Sig Dispense Refill  . aspirin 325 MG tablet Take 325 mg by mouth daily.        . fish oil-omega-3 fatty acids 1000 MG capsule Take 2 g by mouth daily.        [name] ibuprofen (ADVIL,MOTRIN) 600 MG tablet Take 1 tablet by mouth as needed.      . meclizine (ANTIVERT) 32 MG tablet Take 32 mg by mouth 3 (three) times daily as needed.        . multivitamin-lutein (OCUVITE-LUTEIN) CAPS Take 1 capsule by mouth daily.        . NON FORMULARY Flex-mint  -- 2 tabs daily       . RABEprazole (ACIPHEX) 20 MG tablet Take 20 mg by mouth daily.        . solifenacin (VESICARE) 5 MG tablet Take 10 mg by mouth daily.          History   Social History  . Marital Status: Married    Spouse Name: N/A    Number of Children: 4  . Years of Education: N/A   Occupational History  . retired    Social History Main Topics  . Smoking status: Former Smoker   Quit date: 08/25/1998  . Smokeless tobacco: Former Neurosurgeon  . Alcohol Use: No  . Drug Use: No  . Sexually Active: Not on file   Other Topics Concern  . Not on file   Social History Narrative  . No narrative on file    Family History  Problem Relation Age of Onset  . Heart attack Mother   . Heart attack Father   . Heart attack Brother   . Prostate cancer Brother   . Colon cancer Brother     Past Medical History  Diagnosis Date  . Dizziness   . Fatigue     chronic  . HTN (hypertension)   . CVA (cerebral vascular accident)     Old left frontal infarct by MRI 2008  . Vertigo   . Prostate cancer     Dr.Wrenn  . AAA (abdominal aortic aneurysm)     Surgery Dr Arbie Cookey 2000.  [name] Dyslipidemia     Triglycerides elevated  . Chest pain     October , 2012  . Elevated glucose  Past Surgical History  Procedure Date  . Abdominal aortic aneurysm repair   . Femoral artery aneurysm repair   . Cholecystectomy   . Hernia repair     x3  . Knee surgery     ROS  Patient denies fever, chills, , sweats, rash, change in vision, change in hearing, Cough, nausea vomiting,.  All other systems are reviewed and are negative other than the history of present illness.  PHYSICAL EXAM Patient is overweight.  He is oriented to person time and place.  Affect is normal.  He is here with his wife.  It is atraumatic.  There is no xanthelasma.  Is no jugular venous distention.  Lungs are clear.  Respiratory effort is unlabored.  Cardiac exam reveals S1 and S2.  There no clicks or significant murmurs.  There are no carotid bruits.  Abdomen is protuberant but soft.  There is no significant peripheral edema.  There is no significant musculoskeletal deformities.  There is no skin rash. Filed Vitals:   06/11/11 1147  BP: 144/72  Pulse: 70  Height: 5\' 11"  (1.803 m)  Weight: 223 lb (101.152 kg)    EKG is not done today.  I have reviewed the EKG of June 06, 2011.  There is an interventricular  conduction delay.  ASSESSMENT & PLAN

## 2011-06-11 NOTE — Assessment & Plan Note (Signed)
Blood pressure is controlled today. No change in therapy. 

## 2011-06-12 NOTE — Telephone Encounter (Signed)
N/A.  LMTC. 

## 2011-06-12 NOTE — Telephone Encounter (Signed)
Pt was notified of carotid doppler results.

## 2011-06-12 NOTE — Telephone Encounter (Signed)
Pt spouse rtn call

## 2011-06-16 ENCOUNTER — Encounter: Payer: Self-pay | Admitting: *Deleted

## 2011-06-23 ENCOUNTER — Ambulatory Visit (INDEPENDENT_AMBULATORY_CARE_PROVIDER_SITE_OTHER): Payer: Medicare Other | Admitting: Cardiology

## 2011-06-23 ENCOUNTER — Ambulatory Visit (HOSPITAL_COMMUNITY): Payer: Medicare Other | Attending: Cardiology | Admitting: Radiology

## 2011-06-23 ENCOUNTER — Encounter: Payer: Self-pay | Admitting: Cardiology

## 2011-06-23 ENCOUNTER — Encounter (INDEPENDENT_AMBULATORY_CARE_PROVIDER_SITE_OTHER): Payer: Medicare Other | Admitting: Cardiology

## 2011-06-23 DIAGNOSIS — R0989 Other specified symptoms and signs involving the circulatory and respiratory systems: Secondary | ICD-10-CM

## 2011-06-23 DIAGNOSIS — E119 Type 2 diabetes mellitus without complications: Secondary | ICD-10-CM | POA: Insufficient documentation

## 2011-06-23 DIAGNOSIS — R079 Chest pain, unspecified: Secondary | ICD-10-CM

## 2011-06-23 DIAGNOSIS — Z8249 Family history of ischemic heart disease and other diseases of the circulatory system: Secondary | ICD-10-CM | POA: Insufficient documentation

## 2011-06-23 DIAGNOSIS — I714 Abdominal aortic aneurysm, without rupture, unspecified: Secondary | ICD-10-CM

## 2011-06-23 DIAGNOSIS — R9431 Abnormal electrocardiogram [ECG] [EKG]: Secondary | ICD-10-CM

## 2011-06-23 DIAGNOSIS — R5381 Other malaise: Secondary | ICD-10-CM | POA: Insufficient documentation

## 2011-06-23 DIAGNOSIS — Z9889 Other specified postprocedural states: Secondary | ICD-10-CM | POA: Insufficient documentation

## 2011-06-23 DIAGNOSIS — Z8679 Personal history of other diseases of the circulatory system: Secondary | ICD-10-CM | POA: Insufficient documentation

## 2011-06-23 DIAGNOSIS — I10 Essential (primary) hypertension: Secondary | ICD-10-CM | POA: Insufficient documentation

## 2011-06-23 DIAGNOSIS — R0609 Other forms of dyspnea: Secondary | ICD-10-CM | POA: Insufficient documentation

## 2011-06-23 DIAGNOSIS — Z87891 Personal history of nicotine dependence: Secondary | ICD-10-CM | POA: Insufficient documentation

## 2011-06-23 DIAGNOSIS — R42 Dizziness and giddiness: Secondary | ICD-10-CM | POA: Insufficient documentation

## 2011-06-23 DIAGNOSIS — I739 Peripheral vascular disease, unspecified: Secondary | ICD-10-CM | POA: Insufficient documentation

## 2011-06-23 DIAGNOSIS — IMO0002 Reserved for concepts with insufficient information to code with codable children: Secondary | ICD-10-CM

## 2011-06-23 DIAGNOSIS — I2510 Atherosclerotic heart disease of native coronary artery without angina pectoris: Secondary | ICD-10-CM | POA: Insufficient documentation

## 2011-06-23 DIAGNOSIS — E785 Hyperlipidemia, unspecified: Secondary | ICD-10-CM | POA: Insufficient documentation

## 2011-06-23 DIAGNOSIS — R9430 Abnormal result of cardiovascular function study, unspecified: Secondary | ICD-10-CM

## 2011-06-23 DIAGNOSIS — R002 Palpitations: Secondary | ICD-10-CM | POA: Insufficient documentation

## 2011-06-23 DIAGNOSIS — R51 Headache: Secondary | ICD-10-CM

## 2011-06-23 DIAGNOSIS — R5383 Other fatigue: Secondary | ICD-10-CM | POA: Insufficient documentation

## 2011-06-23 MED ORDER — REGADENOSON 0.4 MG/5ML IV SOLN
0.4000 mg | Freq: Once | INTRAVENOUS | Status: AC
  Administered 2011-06-23: 0.4 mg via INTRAVENOUS

## 2011-06-23 MED ORDER — TECHNETIUM TC 99M TETROFOSMIN IV KIT
11.0000 | PACK | Freq: Once | INTRAVENOUS | Status: AC | PRN
  Administered 2011-06-23: 11 via INTRAVENOUS

## 2011-06-23 MED ORDER — TECHNETIUM TC 99M TETROFOSMIN IV KIT
33.0000 | PACK | Freq: Once | INTRAVENOUS | Status: AC | PRN
  Administered 2011-06-23: 33 via INTRAVENOUS

## 2011-06-23 NOTE — Assessment & Plan Note (Signed)
His headache seemed to be better.  No further workup.

## 2011-06-23 NOTE — Assessment & Plan Note (Signed)
His abdominal aorta is stable 12 years after repair.  No further workup.

## 2011-06-23 NOTE — Assessment & Plan Note (Signed)
At this point his chest pain does not appear to be ischemic.  No further workup.

## 2011-06-23 NOTE — Progress Notes (Signed)
[facility] SITE 3 NUCLEAR MED [address] [place] [address] [phone]  Cardiology Nuclear Med Study  KIYON FIDALGO is a 77 y.o. male [id number] 04-May-1933   Nuclear Med Background Indication for Stress Test:  Evaluation for Ischemia History:  No previous documented CAD, Abnormal EKG and AAA repair 2000 Cardiac Risk Factors: CVA, Family History - CAD, History of Smoking, Hypertension, Lipids, NIDDM and PVD  Symptoms:  Chest Pain, Dizziness, DOE, Fatigue and Palpitations   Nuclear Pre-Procedure Caffeine/Decaff Intake:  None NPO After: 7:30pm   Lungs:  clear IV 0.9% NS with Angio Cath:  20g  IV Site: R Antecubital  IV Started by:  Stanton Kidney, EMT-P  Chest Size (in):  46 Cup Size: n/a  Height: 5\' 11"  (1.803 m)  Weight:  224 lb (101.606 kg)  BMI:  Body mass index is 31.24 kg/(m^2). Tech Comments:  NA    Nuclear Med Study 1 or 2 day study: 1 day  Stress Test Type:  Eugenie Birks  Reading MD: Marca Ancona, MD  Order Authorizing Provider:  J.Katz  Resting Radionuclide: Technetium 24m Tetrofosmin  Resting Radionuclide Dose: 11.0 mCi   Stress Radionuclide:  Technetium 60m Tetrofosmin  Stress Radionuclide Dose: 33.0 mCi           Stress Protocol Rest HR: 59 Stress HR: 96  Rest BP: 141/81 Stress BP: 169/90  Exercise Time (min): n/a METS: n/a   Predicted Max HR: 144 bpm % Max HR: 66.67 bpm Rate Pressure Product: 28413   Dose of Adenosine (mg):  n/a Dose of Lexiscan: 0.4 mg  Dose of Atropine (mg): n/a Dose of Dobutamine: n/a mcg/kg/min (at max HR)  Stress Test Technologist: Milana Na, EMT-P  Nuclear Technologist:  Domenic Polite, CNMT     Rest Procedure:  Myocardial perfusion imaging was performed at rest 45 minutes following the intravenous administration of Technetium 54m Tetrofosmin. Rest ECG: NSR  Stress Procedure:  The patient received IV Lexiscan 0.4 mg over 15-seconds.  Technetium 81m Tetrofosmin injected at 30-seconds.  There were no  significant changes, sob, weakness, and occ pvcs with Lexiscan.  Quantitative spect images were obtained after a 45 minute delay. Stress ECG: No significant change from baseline ECG  QPS Raw Data Images:  Normal; no motion artifact; normal heart/lung ratio. Stress Images:  Mildly decreased radiotracer counts in inferior wall.  Rest Images:  Mildly decreased radiotracer counts in inferior wall.  Subtraction (SDS):  Mild fixed inferior perfusion defect.  Transient Ischemic Dilatation (Normal <1.22):  0.97 Lung/Heart Ratio (Normal <0.45):  0.35  Quantitative Gated Spect Images QGS EDV:  104 ml QGS ESV:  42 ml QGS cine images:  NL LV Function; NL Wall Motion QGS EF: 60%  Impression Exercise Capacity:  Lexiscan with no exercise. BP Response:  Normal blood pressure response. Clinical Symptoms:  Dyspnea, no chest pain.  ECG Impression:  PVCs, otherwise no significant changes.  Comparison with Prior Nuclear Study: No images to compare  Overall Impression:  Normal stress nuclear study.  Mild decreased radiotracer counts in the inferior wall with no reversibility and normal wall motion, suspect diaphragmatic attenuation.   Roselina Burgueno [employer]

## 2011-06-23 NOTE — Progress Notes (Signed)
HPI Patient is seen today to followup the assessment of chest pain.  I saw him on June 11, 2011.  We decided to proceed with a nuclear stress study.  This was done and there is no ischemia.  There is normal LV function.  Overall he is feeling well.  Is not having any marked chest pain.  Abdominal ultrasound also was done to followup for repair of his abdominal aorta done in 2000.  The study was technically difficult but there was no marked abnormality. Allergies  Allergen Reactions  . Toradol     Current Outpatient Prescriptions  Medication Sig Dispense Refill  . aspirin 325 MG tablet Take 325 mg by mouth daily.        . fish oil-omega-3 fatty acids 1000 MG capsule Take 2 g by mouth daily.        [name] ibuprofen (ADVIL,MOTRIN) 600 MG tablet Take 1 tablet by mouth as needed.      . multivitamin-lutein (OCUVITE-LUTEIN) CAPS Take 1 capsule by mouth daily.        . NON FORMULARY Flex-mint  -- 2 tabs daily       . RABEprazole (ACIPHEX) 20 MG tablet Take 20 mg by mouth daily.        . solifenacin (VESICARE) 5 MG tablet Take 5 mg by mouth daily.       . meclizine (ANTIVERT) 32 MG tablet Take 32 mg by mouth 3 (three) times daily as needed.         No current facility-administered medications for this visit.   Facility-Administered Medications Ordered in Other Visits  Medication Dose Route Frequency Provider Last Rate Last Dose  . regadenoson (LEXISCAN) injection SOLN 0.4 mg  0.4 mg Intravenous Once Luis Abed, MD   0.4 mg at 06/23/11 1015  . technetium tetrofosmin (TC-MYOVIEW) injection 11 milli Curie  11 milli Curie Intravenous Once PRN Marca Ancona, MD   11 milli Curie at 06/23/11 [phone]  . technetium tetrofosmin (TC-MYOVIEW) injection 33 milli Curie  33 milli Curie Intravenous Once PRN Marca Ancona, MD   33 milli Curie at 06/23/11 1015    History   Social History  . Marital Status: Married    Spouse Name: N/A    Number of Children: 4  . Years of Education: N/A   Occupational  History  . retired    Social History Main Topics  . Smoking status: Former Smoker    Quit date: 08/25/1998  . Smokeless tobacco: Former Neurosurgeon  . Alcohol Use: No  . Drug Use: No  . Sexually Active: Not on file   Other Topics Concern  . Not on file   Social History Narrative  . No narrative on file    Family History  Problem Relation Age of Onset  . Heart attack Mother   . Heart attack Father   . Heart attack Brother   . Prostate cancer Brother   . Colon cancer Brother     Past Medical History  Diagnosis Date  . Dizziness   . Fatigue     chronic  . HTN (hypertension)   . CVA (cerebral vascular accident)     Old left frontal infarct by MRI 2008  . Vertigo   . Prostate cancer     Dr.Wrenn  . AAA (abdominal aortic aneurysm)     Surgery Dr Arbie Cookey 2000. /  Ultrasound October, 2012, no significant abnormality, technically difficult  . Dyslipidemia     Triglycerides elevated  . Chest pain  October , 2012, Nuclear, no ischemia  . Elevated glucose   . Headache     October, 2012  . Ejection fraction     EF normal, nuclear, October, 2012    Past Surgical History  Procedure Date  . Abdominal aortic aneurysm repair   . Femoral artery aneurysm repair   . Cholecystectomy   . Hernia repair     x3  . Knee surgery     ROS  Patient denies fever, chills, headache, sweats, rash, change in vision, change in hearing, chest pain, cough, nausea vomiting, urinary symptoms.  His headaches are improved.  All of the systems are reviewed and are negative. PHYSICAL EXAM Patient here with his wife today.  He is oriented to person time and place.  Affect is normal.  There is no jugulovenous distention.  Lungs are clear.  Respiratory effort is nonlabored.  Cardiac exam reveals S1-S2.  No clicks or significant murmurs.  Abdomen is protuberant but soft.  There is no peripheral edema. Filed Vitals:   06/23/11 1335  BP: 140/76  Pulse: 80  Resp: 18  Height: 5\' 11"  (1.803 m)  Weight:  224 lb (101.606 kg)     ASSESSMENT & PLAN

## 2011-06-23 NOTE — Patient Instructions (Signed)
Your physician wants you to follow-up in: one year You will receive a reminder letter in the mail two months in advance. If you don't receive a letter, please call our office to schedule the follow-up appointment.  

## 2011-06-26 ENCOUNTER — Ambulatory Visit: Payer: Medicare Other | Admitting: Cardiology

## 2011-07-07 ENCOUNTER — Encounter: Payer: Self-pay | Admitting: Cardiology

## 2011-07-08 ENCOUNTER — Encounter: Payer: Self-pay | Admitting: Cardiology

## 2011-07-26 HISTORY — PX: CARDIAC CATHETERIZATION: SHX172

## 2011-08-15 ENCOUNTER — Emergency Department (HOSPITAL_COMMUNITY): Payer: Medicare Other

## 2011-08-15 ENCOUNTER — Encounter (HOSPITAL_COMMUNITY): Payer: Self-pay | Admitting: Emergency Medicine

## 2011-08-15 ENCOUNTER — Other Ambulatory Visit: Payer: Self-pay

## 2011-08-15 ENCOUNTER — Other Ambulatory Visit: Payer: Self-pay | Admitting: Thoracic Surgery (Cardiothoracic Vascular Surgery)

## 2011-08-15 ENCOUNTER — Encounter (HOSPITAL_COMMUNITY)
Admission: EM | Disposition: A | Discharge status: Home / Self Care | Payer: Self-pay | Source: Home / Self Care | Attending: Thoracic Surgery (Cardiothoracic Vascular Surgery)

## 2011-08-15 ENCOUNTER — Inpatient Hospital Stay (HOSPITAL_COMMUNITY)
Admission: EM | Admit: 2011-08-15 | Discharge: 2011-08-27 | DRG: 234 | Disposition: A | Discharge status: Home / Self Care | Payer: Medicare Other | Attending: Thoracic Surgery (Cardiothoracic Vascular Surgery) | Admitting: Thoracic Surgery (Cardiothoracic Vascular Surgery)

## 2011-08-15 DIAGNOSIS — Z951 Presence of aortocoronary bypass graft: Secondary | ICD-10-CM

## 2011-08-15 DIAGNOSIS — Z8042 Family history of malignant neoplasm of prostate: Secondary | ICD-10-CM

## 2011-08-15 DIAGNOSIS — Z800 Family history of malignant neoplasm of digestive organs: Secondary | ICD-10-CM

## 2011-08-15 DIAGNOSIS — Z79899 Other long term (current) drug therapy: Secondary | ICD-10-CM

## 2011-08-15 DIAGNOSIS — R42 Dizziness and giddiness: Secondary | ICD-10-CM

## 2011-08-15 DIAGNOSIS — Z87891 Personal history of nicotine dependence: Secondary | ICD-10-CM

## 2011-08-15 DIAGNOSIS — I639 Cerebral infarction, unspecified: Secondary | ICD-10-CM

## 2011-08-15 DIAGNOSIS — I10 Essential (primary) hypertension: Secondary | ICD-10-CM

## 2011-08-15 DIAGNOSIS — Z7982 Long term (current) use of aspirin: Secondary | ICD-10-CM

## 2011-08-15 DIAGNOSIS — Z8546 Personal history of malignant neoplasm of prostate: Secondary | ICD-10-CM

## 2011-08-15 DIAGNOSIS — D693 Immune thrombocytopenic purpura: Secondary | ICD-10-CM | POA: Diagnosis present

## 2011-08-15 DIAGNOSIS — J40 Bronchitis, not specified as acute or chronic: Secondary | ICD-10-CM | POA: Diagnosis present

## 2011-08-15 DIAGNOSIS — I200 Unstable angina: Secondary | ICD-10-CM

## 2011-08-15 DIAGNOSIS — I119 Hypertensive heart disease without heart failure: Secondary | ICD-10-CM | POA: Diagnosis present

## 2011-08-15 DIAGNOSIS — R7309 Other abnormal glucose: Secondary | ICD-10-CM | POA: Diagnosis present

## 2011-08-15 DIAGNOSIS — E8779 Other fluid overload: Secondary | ICD-10-CM | POA: Diagnosis not present

## 2011-08-15 DIAGNOSIS — E785 Hyperlipidemia, unspecified: Secondary | ICD-10-CM

## 2011-08-15 DIAGNOSIS — E119 Type 2 diabetes mellitus without complications: Secondary | ICD-10-CM | POA: Diagnosis present

## 2011-08-15 DIAGNOSIS — Z8249 Family history of ischemic heart disease and other diseases of the circulatory system: Secondary | ICD-10-CM

## 2011-08-15 DIAGNOSIS — I2510 Atherosclerotic heart disease of native coronary artery without angina pectoris: Secondary | ICD-10-CM

## 2011-08-15 DIAGNOSIS — Z8673 Personal history of transient ischemic attack (TIA), and cerebral infarction without residual deficits: Secondary | ICD-10-CM

## 2011-08-15 DIAGNOSIS — K5900 Constipation, unspecified: Secondary | ICD-10-CM | POA: Diagnosis not present

## 2011-08-15 DIAGNOSIS — C61 Malignant neoplasm of prostate: Secondary | ICD-10-CM

## 2011-08-15 DIAGNOSIS — D62 Acute posthemorrhagic anemia: Secondary | ICD-10-CM | POA: Diagnosis not present

## 2011-08-15 DIAGNOSIS — K219 Gastro-esophageal reflux disease without esophagitis: Secondary | ICD-10-CM | POA: Diagnosis present

## 2011-08-15 HISTORY — PX: LEFT HEART CATHETERIZATION WITH CORONARY ANGIOGRAM: SHX5451

## 2011-08-15 HISTORY — DX: Unspecified osteoarthritis, unspecified site: M19.90

## 2011-08-15 HISTORY — DX: Pneumonia, unspecified organism: J18.9

## 2011-08-15 HISTORY — DX: Gastro-esophageal reflux disease without esophagitis: K21.9

## 2011-08-15 LAB — CBC
HCT: 41.7 % (ref 39.0–52.0)
Hemoglobin: 14.7 g/dL (ref 13.0–17.0)
MCH: 31.9 pg (ref 26.0–34.0)
MCHC: 35.3 g/dL (ref 30.0–36.0)

## 2011-08-15 LAB — CARDIAC PANEL(CRET KIN+CKTOT+MB+TROPI)
Relative Index: 1 (ref 0.0–2.5)
Total CK: 180 U/L (ref 7–232)
Total CK: 163 U/L (ref 7–232)

## 2011-08-15 LAB — POCT I-STAT, CHEM 8
Creatinine, Ser: 0.9 mg/dL (ref 0.50–1.35)
Glucose, Bld: 191 mg/dL — ABNORMAL HIGH (ref 70–99)
Hemoglobin: 14.6 g/dL (ref 13.0–17.0)
Potassium: 3.8 mEq/L (ref 3.5–5.1)

## 2011-08-15 LAB — DIFFERENTIAL
Basophils Relative: 0 % (ref 0–1)
Eosinophils Absolute: 0.1 10*3/uL (ref 0.0–0.7)
Lymphocytes Relative: 5 % — ABNORMAL LOW (ref 12–46)
Monocytes Absolute: 0.7 10*3/uL (ref 0.1–1.0)
Neutro Abs: 5.8 10*3/uL (ref 1.7–7.7)

## 2011-08-15 SURGERY — LEFT HEART CATHETERIZATION WITH CORONARY ANGIOGRAM
Anesthesia: LOCAL

## 2011-08-15 MED ORDER — NITROGLYCERIN IN D5W 200-5 MCG/ML-% IV SOLN
3.0000 ug/min | INTRAVENOUS | Status: DC
  Administered 2011-08-15: 5 ug/min via INTRAVENOUS
  Administered 2011-08-15: 3 ug/min via INTRAVENOUS
  Administered 2011-08-16 – 2011-08-20 (×3): 10 ug/min via INTRAVENOUS
  Filled 2011-08-15 (×4): qty 250

## 2011-08-15 MED ORDER — SODIUM CHLORIDE 0.9 % IJ SOLN: 3.0000 mL | Freq: Two times a day (BID) | INTRAMUSCULAR | Status: DC

## 2011-08-15 MED ORDER — HEPARIN SOD (PORCINE) IN D5W 100 UNIT/ML IV SOLN
1300.0000 [IU]/h | INTRAVENOUS | Status: DC
  Administered 2011-08-15: 1300 [IU]/h via INTRAVENOUS
  Filled 2011-08-15: qty 250

## 2011-08-15 MED ORDER — OMEGA-3-ACID ETHYL ESTERS 1 G PO CAPS
2.0000 g | ORAL_CAPSULE | Freq: Every day | ORAL | Status: DC
  Administered 2011-08-15 – 2011-08-18 (×4): 2 g via ORAL
  Filled 2011-08-15 (×4): qty 2

## 2011-08-15 MED ORDER — ADENOSINE 12 MG/4ML IV SOLN
16.0000 mL | Freq: Once | INTRAVENOUS | Status: DC
  Filled 2011-08-15: qty 16

## 2011-08-15 MED ORDER — SODIUM CHLORIDE 0.9 % IV SOLN: 250.0000 mL | INTRAVENOUS | Status: DC | PRN

## 2011-08-15 MED ORDER — GUAIFENESIN ER 600 MG PO TB12
1200.0000 mg | ORAL_TABLET | Freq: Two times a day (BID) | ORAL | Status: DC
  Administered 2011-08-15: 1200 mg via ORAL
  Filled 2011-08-15 (×2): qty 2

## 2011-08-15 MED ORDER — GUAIFENESIN-DM 100-10 MG/5ML PO SYRP: 5.0000 mL | ORAL_SOLUTION | ORAL | Status: DC | PRN

## 2011-08-15 MED ORDER — HEPARIN SODIUM (PORCINE) 1000 UNIT/ML IJ SOLN
INTRAMUSCULAR | Status: AC
  Filled 2011-08-15: qty 1

## 2011-08-15 MED ORDER — IPRATROPIUM BROMIDE 0.02 % IN SOLN
0.5000 mg | Freq: Once | RESPIRATORY_TRACT | Status: AC
  Administered 2011-08-15: 0.5 mg via RESPIRATORY_TRACT
  Filled 2011-08-15: qty 2.5

## 2011-08-15 MED ORDER — LIDOCAINE HCL (PF) 1 % IJ SOLN
INTRAMUSCULAR | Status: AC
  Filled 2011-08-15: qty 30

## 2011-08-15 MED ORDER — ONDANSETRON HCL 4 MG/2ML IJ SOLN
4.0000 mg | Freq: Four times a day (QID) | INTRAMUSCULAR | Status: DC | PRN
  Administered 2011-08-16: 4 mg via INTRAVENOUS
  Filled 2011-08-15: qty 2

## 2011-08-15 MED ORDER — GUAIFENESIN 100 MG/5ML PO SOLN
5.0000 mL | ORAL | Status: DC | PRN
  Administered 2011-08-16 – 2011-08-21 (×23): 100 mg via ORAL
  Filled 2011-08-15 (×27): qty 5

## 2011-08-15 MED ORDER — NITROGLYCERIN 0.4 MG SL SUBL: 0.4000 mg | SUBLINGUAL_TABLET | SUBLINGUAL | Status: DC | PRN

## 2011-08-15 MED ORDER — NITROGLYCERIN 0.2 MG/ML ON CALL CATH LAB
INTRAVENOUS | Status: AC
  Filled 2011-08-15: qty 1

## 2011-08-15 MED ORDER — PROSIGHT PO TABS
1.0000 | ORAL_TABLET | Freq: Every day | ORAL | Status: DC
  Administered 2011-08-15 – 2011-08-20 (×6): 1 via ORAL
  Filled 2011-08-15 (×8): qty 1

## 2011-08-15 MED ORDER — ACETAMINOPHEN 325 MG PO TABS
650.0000 mg | ORAL_TABLET | ORAL | Status: DC | PRN
  Administered 2011-08-15 – 2011-08-16 (×2): 650 mg via ORAL
  Filled 2011-08-15 (×2): qty 2

## 2011-08-15 MED ORDER — SODIUM CHLORIDE 0.9 % IV SOLN
INTRAVENOUS | Status: DC
  Administered 2011-08-15: 12:00:00 via INTRAVENOUS

## 2011-08-15 MED ORDER — ACETAMINOPHEN 325 MG PO TABS: 650.0000 mg | ORAL_TABLET | ORAL | Status: DC | PRN

## 2011-08-15 MED ORDER — IOHEXOL 350 MG/ML SOLN
100.0000 mL | Freq: Once | INTRAVENOUS | Status: AC | PRN
  Administered 2011-08-15: 100 mL via INTRAVENOUS

## 2011-08-15 MED ORDER — FENTANYL CITRATE 0.05 MG/ML IJ SOLN
INTRAMUSCULAR | Status: AC
  Filled 2011-08-15: qty 2

## 2011-08-15 MED ORDER — SODIUM CHLORIDE 0.9 % IV SOLN: INTRAVENOUS | Status: AC

## 2011-08-15 MED ORDER — SODIUM CHLORIDE 0.9 % IJ SOLN
3.0000 mL | Freq: Two times a day (BID) | INTRAMUSCULAR | Status: DC
  Administered 2011-08-15: 3 mL via INTRAVENOUS

## 2011-08-15 MED ORDER — OCUVITE-LUTEIN PO CAPS: 1.0000 | ORAL_CAPSULE | Freq: Every day | ORAL | Status: DC

## 2011-08-15 MED ORDER — DIAZEPAM 5 MG PO TABS
5.0000 mg | ORAL_TABLET | ORAL | Status: AC
  Administered 2011-08-15: 5 mg via ORAL
  Filled 2011-08-15: qty 1

## 2011-08-15 MED ORDER — AZITHROMYCIN 250 MG PO TABS
500.0000 mg | ORAL_TABLET | Freq: Every day | ORAL | Status: AC
  Administered 2011-08-15: 500 mg via ORAL
  Filled 2011-08-15: qty 2

## 2011-08-15 MED ORDER — MECLIZINE HCL 25 MG PO TABS
32.0000 mg | ORAL_TABLET | Freq: Three times a day (TID) | ORAL | Status: DC | PRN
  Filled 2011-08-15: qty 0.5

## 2011-08-15 MED ORDER — SODIUM CHLORIDE 0.9 % IJ SOLN: 3.0000 mL | INTRAMUSCULAR | Status: DC | PRN

## 2011-08-15 MED ORDER — OMEGA-3 FATTY ACIDS 1000 MG PO CAPS: 2.0000 g | ORAL_CAPSULE | Freq: Every day | ORAL | Status: DC

## 2011-08-15 MED ORDER — ALBUTEROL SULFATE (5 MG/ML) 0.5% IN NEBU
2.5000 mg | INHALATION_SOLUTION | Freq: Four times a day (QID) | RESPIRATORY_TRACT | Status: DC | PRN
  Administered 2011-08-16 – 2011-08-25 (×10): 2.5 mg via RESPIRATORY_TRACT
  Filled 2011-08-15 (×8): qty 0.5

## 2011-08-15 MED ORDER — AZITHROMYCIN 250 MG PO TABS
250.0000 mg | ORAL_TABLET | Freq: Every day | ORAL | Status: AC
  Administered 2011-08-16 – 2011-08-19 (×4): 250 mg via ORAL
  Filled 2011-08-15 (×4): qty 1

## 2011-08-15 MED ORDER — ASPIRIN EC 81 MG PO TBEC
81.0000 mg | DELAYED_RELEASE_TABLET | Freq: Every day | ORAL | Status: DC
  Administered 2011-08-16 – 2011-08-20 (×5): 81 mg via ORAL
  Filled 2011-08-15 (×6): qty 1

## 2011-08-15 MED ORDER — HEPARIN (PORCINE) IN NACL 2-0.9 UNIT/ML-% IJ SOLN
INTRAMUSCULAR | Status: AC
  Filled 2011-08-15: qty 2000

## 2011-08-15 MED ORDER — DARIFENACIN HYDROBROMIDE ER 7.5 MG PO TB24
7.5000 mg | ORAL_TABLET | Freq: Every day | ORAL | Status: DC
  Administered 2011-08-15: 7.5 mg via ORAL
  Filled 2011-08-15: qty 1

## 2011-08-15 MED ORDER — ALBUTEROL SULFATE (5 MG/ML) 0.5% IN NEBU
5.0000 mg | INHALATION_SOLUTION | Freq: Once | RESPIRATORY_TRACT | Status: AC
  Administered 2011-08-15: 5 mg via RESPIRATORY_TRACT
  Filled 2011-08-15: qty 1

## 2011-08-15 MED ORDER — HEPARIN BOLUS VIA INFUSION
4000.0000 [IU] | Freq: Once | INTRAVENOUS | Status: AC
  Administered 2011-08-15: 4000 [IU] via INTRAVENOUS
  Filled 2011-08-15: qty 4000

## 2011-08-15 MED ORDER — ROSUVASTATIN CALCIUM 40 MG PO TABS
40.0000 mg | ORAL_TABLET | Freq: Every day | ORAL | Status: DC
  Administered 2011-08-16 – 2011-08-20 (×5): 40 mg via ORAL
  Filled 2011-08-15 (×6): qty 1

## 2011-08-15 MED ORDER — MIDAZOLAM HCL 2 MG/2ML IJ SOLN
INTRAMUSCULAR | Status: AC
  Filled 2011-08-15: qty 2

## 2011-08-15 MED ORDER — ROSUVASTATIN CALCIUM 40 MG PO TABS
40.0000 mg | ORAL_TABLET | ORAL | Status: AC
  Administered 2011-08-15: 40 mg via ORAL
  Filled 2011-08-15: qty 1

## 2011-08-15 MED ORDER — METOPROLOL TARTRATE 25 MG PO TABS
25.0000 mg | ORAL_TABLET | Freq: Two times a day (BID) | ORAL | Status: DC
  Administered 2011-08-15 – 2011-08-20 (×11): 25 mg via ORAL
  Filled 2011-08-15 (×15): qty 1

## 2011-08-15 NOTE — ED Provider Notes (Signed)
Medical screening examination/treatment/procedure(s) were conducted as a shared visit with non-physician practitioner(s) and myself.  I personally evaluated the patient during the encounter  Seen and examined.  Chest pain and SOB started acutely this evening at rest.  No n/v +d Ao3 FROM x 4 RRR distant Tachypnea, rales rhonchi NABS  Plan cardiac enzymes and admit  Tamika Shropshire K Mardi Cannady-Rasch, MD 08/15/11 [phone]

## 2011-08-15 NOTE — ED Notes (Signed)
To CT on stretcher

## 2011-08-15 NOTE — ED Notes (Signed)
Woke from sound sleep with pain--eased with ASA, 3SL Nitro and 1 " paste.  Pain now 1-2 from 10. No MI/Angina--2 aneurysms repaired.

## 2011-08-15 NOTE — Progress Notes (Signed)
Site area: right groin  Site Prior to Removal:  Level 0  Pressure Applied For 20 MINUTES    Minutes Beginning at 1845 Manual:   yes  Patient Status During Pull:  stable  Post Pull Groin Site:  Level 0  Post Pull Instructions Given:  yes  Post Pull Pulses Present:  yes  Dressing Applied:  yes  Comments:  Gauze secured with medipore tape. dp and pt palpable right lower extremity

## 2011-08-15 NOTE — Brief Op Note (Signed)
    Cardiac Cath Note  [name] [id number] 12/06/1932  Procedure: left Heart Cardiac Catheterization Note Indications: chest pain  Procedure Details Consent: Obtained Time Out: Verified patient identification, verified procedure, site/side was marked, verified correct patient position, special equipment/implants available, Radiology Safety Procedures followed,  medications/allergies/relevent history reviewed, required imaging and test results available.  Performed  The right femoral artery was easily canulated using a modified Seldinger technique.  Hemodynamics:    LV pressure: 113/22  Aortic pressure: 110/62  Angiography   The LM and LAD are heavily calcified.   All coronaries have moderate ectasia  Left Main: The left main is fairly vague naproxen sodium. The distal left main tapers to a 2030% stenosis at the takeoff of the LAD and circumflex artery.  Left anterior Descending: The left anterior descending artery is very heavily calcified. There is a proximal 60-70% stenosis followed by severely calcified 50% stenosis and then another 60-70% stenosis. The distal LAD appears to be fairly normal with only minor luminal or right leg is in his overall good target.  There 3 diagonal branches which originate in the proximal/mid LAD which have mild to moderate irregular she's.  Left Circumflex: The left circumflex artery is selectively small branch. It arises in the middle of the distal left main stenosis. The circumflex is a small to moderate size branching gives off a moderate-sized first obtuse marginal branch. The distal circumflex has minor luminal) allergies.  Right Coronary Artery: The right coronary artery is large and dominant. There is moderate diffuse disease throughout the right coronary artery. There is a mid stenosis of 40% in the mid RCA. Did The posterior descending artery and the posterior lateral segment artery are unremarkable.  LV Gram: The left ventriculogram  was performed in the 30 RAO position. It reveals normal left ventricular systolic function. Ejection fraction is proximal to 65%.  Complications: No apparent complications Patient did tolerate procedure well.  Conclusions:   1. Coronary artery disease the patient has moderate to severe coronary artery disease involving the left main and left anterior descending artery. This region is very heavily calcified. I think that he is a relatively poor candidate for percutaneous intervention. I discussed the case with Dr. Katz and also with Dr. Macklin any period we will perform a flow wire evaluation of the LAD. If he is found to be ischemic, we will refer him to bypass grafting.  Kinze Labo J. Koen Antilla, Jr., MD, FACC 08/15/2011, 3:18 PM 

## 2011-08-15 NOTE — ED Notes (Signed)
To Xray and back then Nebulizer treatment begun.

## 2011-08-15 NOTE — Op Note (Signed)
Cardiac Catheterization Operative Report  REIN POPOV [id number] 12/21/20124:06 PM Allie Dimmer, OTR, OTR  Procedure Performed:  1. Fractional flow reserve of the LAD.   Operator: Verne Carrow, MD  Indication: Pt admitted with symptoms c/w unstable angina. Diagnostic cath per Dr. Elease Hashimoto today. Pt with distal LM stenosis, proximal LAD stenosis involving the two moderate sized diagonal branches with an aneurysmal segment just beyond the severe LAD stenosis. I was asked to perform an FFR to assess the flow down the LAD.                                     Procedure Details: When I entered the case, the patient had a 6 French sheath present in the right femoral artery. He was given 5000 units of heparin IV. I then engaged the left main artery with a 6 [place] XB LAD 4.0 guiding catheter. When the ACT was greater than 200, I passed a pressure wire down the LAD. The baseline FFR was 0.81. After infusion of IV adenosine, the FFR was 0.71. This suggested that the lesions in the distal LM and proximal LAD were flow limiting. The patients anatomy is not favorable for PCI given the aneurysmal segment beyond the severe LAD stenosis which also involves two moderate sized diagonal branches. The patient also has a distal left main stenosis that is at least moderately severe as well as a moderate RCA stenosis. There were no immediate complications. The patient was taken to the recovery area in stable condition.   Hemodynamic Findings: Central aortic pressure: 110/62  Impression: 1. Severe LAD stenosis with Fractional flow reserve of 0.71 suggesting impairment of flow down the vessel. This vessel is not favorable for PCI secondary to the anatomy as described above.   Recommendations: CT surgery consult for CABG.        Complications:  None; patient tolerated the procedure well.

## 2011-08-15 NOTE — Interval H&P Note (Signed)
History and Physical Interval Note:  08/15/2011 2:39 PM  [name]  has presented today for surgery, with the diagnosis of chest pain  The various methods of treatment have been discussed with the patient and family. After consideration of risks, benefits and other options for treatment, the patient has consented to  Procedure(s): LEFT HEART CATHETERIZATION WITH CORONARY ANGIOGRAM as a surgical intervention .  The patients' history has been reviewed, patient examined, no change in status, stable for surgery.  I have reviewed the patients' chart and labs.  Questions were answered to the patient's satisfaction.   Pt presented to the ER today with chest pain.  Normal stress test in Oct. Discussed risks, benefits, options.  He understands and agrees to proceed.   Vesta Mixer, Montez Hageman., MD, [facility] 08/15/2011, 2:40 PM

## 2011-08-15 NOTE — H&P (Signed)
History and Physical   Patient ID: EDDER Hunter MRN: [id number], DOB/AGE: 10/01/1932   Admit date: 08/15/2011 Date of Consult: 08/15/2011   Primary Physician: Dolores Hoose, OTR Primary Cardiologist: Jerral Bonito, MD  Pt. Profile: Mr. Shock is a 77 yo Caucasian male with PMHx significant for AAA (s/p repair in 2000; normal abdominal ultrasound this year), HTN, tobacco abuse (>40 pack-year history) and glucose intolerance who presented to [facility] ED with chest pain.   Problem List: Past Medical History  Diagnosis Date  . Dizziness   . Fatigue     chronic  . HTN (hypertension)   . CVA (cerebral vascular accident)     Old left frontal infarct by MRI 2008  . Vertigo   . Prostate cancer     Dr.Wrenn  . AAA (abdominal aortic aneurysm)     Surgery Dr Arbie Cookey 2000. /  Ultrasound October, 2012, no significant abnormality, technically difficult  . Dyslipidemia     Triglycerides elevated  . Chest pain     October , 2012, Nuclear, no ischemia  . Elevated glucose   . Headache     October, 2012  . Ejection fraction     EF normal, nuclear, October, 2012    Past Surgical History  Procedure Date  . Abdominal aortic aneurysm repair   . Femoral artery aneurysm repair   . Cholecystectomy   . Hernia repair     x3  . Knee surgery      Allergies:  Allergies  Allergen Reactions  . Toradol Rash    HPI:   He reports waking around 3am this morning with SSCP without radiation described as "cinder block sitting on chest" rated at a 10/10, lasting for >1 hour. He reports associated diaphoresis and lightheadedness. He immediately chewed two ASA 81 with relief. Alleviated also by sitting up in bed. No aggravating factors. He denies SOB, palpitations, orthopnea, dizziness, edema, DOE, extended travel. Of note, he reports having a "cold" with head congestion, productive cough with clear sputum and fevers to 101.   He called EMS and was transported to [facility] ED. He received 1 ASA 324, NTG  SL x 3 and NTG paste with relief of pain to 1-2/10. EKG sinus tachycardia at 104 bpm with questionable posterior infarct. CXR reveals no acute cardiopulmonary process, mild cardiomegaly and hyperinflation, no evidence of edema or consolidation. CT angiogram of the chest revealed the below results including left main and LAD coronary calcification. POC TnI neg.   He is currently chest pain free and resting comfortably.    Inpatient Medications:     . albuterol  5 mg Nebulization Once  . ipratropium  0.5 mg Nebulization Once    (Not in a hospital admission)  Family History  Problem Relation Age of Onset  . Heart attack Mother   . Heart attack Father   . Heart attack Brother   . Prostate cancer Brother   . Colon cancer Brother      History   Social History  . Marital Status: Married    Spouse Name: N/A    Number of Children: 4  . Years of Education: N/A   Occupational History  . retired    Social History Main Topics  . Smoking status: Former Smoker    Quit date: 08/25/1998  . Smokeless tobacco: Former Neurosurgeon  . Alcohol Use: No  . Drug Use: No  . Sexually Active: Not on file   Other Topics Concern  . Not on file  Social History Narrative  . No narrative on file     Review of Systems: General: positive fever, negative for chills, night sweats or weight changes.  Cardiovascular: positive for chest pain, negative dyspnea on exertion, edema, orthopnea, palpitations, paroxysmal nocturnal dyspnea or shortness of breath Dermatological: negative for rash Respiratory: positive for cough, negative for wheezing Urologic: negative for hematuria Abdominal: positivetive for nausea, vomiting, negative for diarrhea, bright red blood per rectum, melena, or hematemesis Neurologic: negative for visual changes, syncope, or dizziness All other systems reviewed and are otherwise negative except as noted above.  Physical Exam: Blood pressure 112/62, pulse 98, temperature 99.3 F (37.4  C), temperature source Oral, resp. rate 18, SpO2 97.00%.   General: Well developed, well nourished, NAD Head: Normocephalic, atraumatic, sclera non-icteric  Neck: Negative for carotid bruits. JVD not elevated. Supple.  Lungs: Clear bilaterally to auscultation without wheezes, rales, or rhonchi. Breathing is unlabored. Heart: RRR with S1 S2. No murmurs, rubs, or gallops appreciated. Abdomen: Soft, non-tender, non-distended with normoactive bowel sounds. No hepatomegaly. No rebound/guarding. No obvious abdominal masses. Msk:  Strength and tone appears normal for age. Extremities: No clubbing, cyanosis or edema.  Distal pedal pulses are 2+ and equal bilaterally. Neuro: Alert and oriented X 3. Moves all extremities spontaneously. Psych:  Responds to questions appropriately with a normal affect.  Labs: Recent Labs  Basename 08/15/11 0458 08/15/11 0443   WBC -- 6.9   HGB 14.6 14.7   HCT 43.0 41.7   MCV -- 90.5   PLT -- 96*    Lab 08/15/11 0458  NA 139  K 3.8  CL 104  CO2 --  BUN 15  CREATININE 0.90  CALCIUM --  PROT --  BILITOT --  ALKPHOS --  ALT --  AST --  AMYLASE --  LIPASE --  GLUCOSE 191*   Radiology/Studies: Dg Chest 2 View  08/15/2011  *RADIOLOGY REPORT*  Clinical Data: Severe chest pain, cough.  CHEST - 2 VIEW  Comparison: 08/28/2004  Findings: Heart size upper normal limits to mildly enlarged.  Mild tortuosity to the thoracic aorta. Bilateral nodular densities projecting over the lower lungs presumably represent nipple shadows.  Mild hyperinflation and chronic interstitial prominence without focal consolidation.  No pleural effusion or pneumothorax. Multilevel degenerative changes.  No acute osseous abnormality.  IMPRESSION: Mild cardiomegaly.  Stable hyperinflation and interstitial markings.  No focal consolidation.  Original Report Authenticated By: Waneta Martins, M.D.   Ct Angio Chest W/cm &/or Wo Cm  08/15/2011  *RADIOLOGY REPORT*  Clinical Data:  Chest pain  and shortness of breath.  CT ANGIOGRAPHY CHEST WITH CONTRAST  Technique:  Multidetector CT imaging of the chest was performed using the standard protocol during bolus administration of intravenous contrast.  Multiplanar CT image reconstructions including MIPs were obtained to evaluate the vascular anatomy.  Contrast: OMNIPAQUE IOHEXOL 350 MG/ML IV SOLN  Comparison:  Chest x-ray 08/15/2011.  Findings:  Mild respiratory motion degrades images through the lung bases.  Overall study diagnostic.  Good opacification of the central pulmonary vasculature.  No evidence for central pulmonary embolic disease. A tiny peripheral embolus could be obscured but is unlikely to be clinically significant.  Cardiomegaly.  Advanced coronary artery calcification most notable at the origin of the left main coronary artery and left anterior descending.  No pleural or pericardial effusion.  No hilar or mediastinal adenopathy.  Mild interstitial prominence may represent chronic lung disease but there are no areas of lobar consolidation or significant atelectasis.  Multilevel  spondylosis.  No acute findings in the upper abdomen.  Previous cholecystectomy.  Right renal cyst incompletely evaluated.  Review of the MIP images confirms the above findings.  IMPRESSION: No central pulmonary embolus is observed.  Cardiomegaly with advanced coronary calcification.  Original Report Authenticated By: Elsie Stain, M.D.    EKG: Sinus tachycardia, 104 bpm, possible posterior infarct  ASSESSMENT AND PLAN:   1. Chest pain- pt's symptoms are concerned for unstable angina, EKG without ischemic changes, POC TnI neg x 1; CXR revealed mild cardiomegaly without evidence of edema, PNA or acute cardiopulmonary process; CT angiogram revealed coronary calcification of L main and LAD, no evidence of PE ; pt had normal stress test in 10/12.   - Admit to tele  - Schedule cardiac cath for today  - Cardiac enzymes  - Serial EKGs  - Heparin gtt  - NTG  gtt for pain  - ASA, low-dose BB  - Lipid panel  - 2D Echocardiogram  2. HTN- pt with controlled BP readings currently; not on antihypertensives outpatient, will monitor  3. Glucose intolerance- BG high in the ED, not on oral antihyperglycemics or insulin outpatient   - will monitor inpatient  Signed, R. Hurman Horn, PA-C 08/15/2011, 9:30 AM   Patient seen and examined. I agree with the assessment and plan as detailed above. See also my additional thoughts below.   Principal Problem:   *Chest pain    The patient has recently had bronchitis. He continues to have a cough. However his current chest pain does not seem to be consistent with pain from his coughing. The patient has known significant vascular disease. His chest CT showed coronary calcification there was significant. He certainly has coronary disease. So far his first troponin is normal. There is no diagnostic EKG changes. However we need to proceed with aggressive treatment and evaluation of his coronary status. We will proceed with cardiac catheterization. Active Problems:  HTN (hypertension)  Elevated glucose   Bronchitis    The patient gives a history of recent bronchitis. His chest x-ray does not show any significant infiltrates.there may be slight elevation in his temperature. There is no elevation in his white blood count. He will be given Zithromax. We'll also give him a mild cough suppressant.    Willa Rough, MD, [facility] 08/15/2011 10:09 AM

## 2011-08-15 NOTE — ED Notes (Addendum)
Pt undressed and placed on cardiac monitor, bp cuff, and pulse ox. Pt placed on 2L [place] O2 per protocol

## 2011-08-15 NOTE — ED Notes (Signed)
P. Damen PA in to talk with pt/family--Chest CT ordered.

## 2011-08-15 NOTE — ED Provider Notes (Deleted)
Medical screening examination/treatment/procedure(s) were performed by non-physician practitioner and as supervising physician I was immediately available for consultation/collaboration.  Jasmine Awe, MD 08/15/11 [phone]

## 2011-08-15 NOTE — ED Notes (Signed)
Pt states "belly hurts when I cough" otherwise denies c/o. No distress and family at bedside, aware waiting for cardiology to evaluate.

## 2011-08-15 NOTE — ED Provider Notes (Signed)
History     CSN: [id number]  Arrival date & time 08/15/11  0416   First MD Initiated Contact with Patient 08/15/11 0434      Chief Complaint  Patient presents with  . Chest Pain     HPI  History provided by the patient and family. Patient is a 77 year old male with history of hypertension AAA, CVA, past history of prostate cancer, who presents with acute onset of chest pain and pressure that woke him from sleep just prior to arrival. Patient reports having a heavy feeling over his chest like a "stack of bricks". Patient reports having some similar episodes in the past several months that were similar but never this severe. Patient also reports having a visit to a cardiologist here in town last month with several tests. Patient thinks that he may have had a stress test at that time. Patient does report a history of cardiac catheterization in 2000 when he had his AAA repair. Patient also reports a significant family history for coronary artery disease and MI. Both patient's father and mother passed away from heart attacks in his brother recently had are intact as well. Patient did receive 3 sublingual nitroglycerin and 1 inch paste to his chest with improvement of symptoms. Patient currently rates pain a 1 or 2/10. Patient was a former smoker but quit in 2000.    Past Medical History  Diagnosis Date  . Dizziness   . Fatigue     chronic  . HTN (hypertension)   . CVA (cerebral vascular accident)     Old left frontal infarct by MRI 2008  . Vertigo   . Prostate cancer     Dr.Wrenn  . AAA (abdominal aortic aneurysm)     Surgery Dr Arbie Cookey 2000. /  Ultrasound October, 2012, no significant abnormality, technically difficult  . Dyslipidemia     Triglycerides elevated  . Chest pain     October , 2012, Nuclear, no ischemia  . Elevated glucose   . Headache     October, 2012  . Ejection fraction     EF normal, nuclear, October, 2012    Past Surgical History  Procedure Date  . Abdominal  aortic aneurysm repair   . Femoral artery aneurysm repair   . Cholecystectomy   . Hernia repair     x3  . Knee surgery     Family History  Problem Relation Age of Onset  . Heart attack Mother   . Heart attack Father   . Heart attack Brother   . Prostate cancer Brother   . Colon cancer Brother     History  Substance Use Topics  . Smoking status: Former Smoker    Quit date: 08/25/1998  . Smokeless tobacco: Former Neurosurgeon  . Alcohol Use: No      Review of Systems  Constitutional: Positive for diaphoresis. Negative for chills.  Respiratory: Positive for shortness of breath.   Cardiovascular: Positive for chest pain.  Gastrointestinal: Negative for nausea, vomiting, abdominal pain and diarrhea.  All other systems reviewed and are negative.    Allergies  Toradol  Home Medications   Current Outpatient Rx  Name Route Sig Dispense Refill  . ASPIRIN 325 MG PO TABS Oral Take 325 mg by mouth daily.      . OMEGA-3 FATTY ACIDS 1000 MG PO CAPS Oral Take 2 g by mouth daily.      . GUAIFENESIN ER 600 MG PO TB12 Oral Take 1,200 mg by mouth 2 (two) times daily.      [name]  IBUPROFEN 600 MG PO TABS Oral Take 1 tablet by mouth every 6 (six) hours as needed. For pain    . MECLIZINE HCL 32 MG PO TABS Oral Take 32 mg by mouth 3 (three) times daily as needed. For dizziness    . OCUVITE-LUTEIN PO CAPS Oral Take 1 capsule by mouth daily.      . NON FORMULARY  Flex-mint  -- 2 tabs daily     . RABEPRAZOLE SODIUM 20 MG PO TBEC Oral Take 20 mg by mouth daily.      [name] SOLIFENACIN SUCCINATE 5 MG PO TABS Oral Take 5 mg by mouth daily.       BP 137/68  Pulse 105  Temp(Src) 99.1 F (37.3 C) (Oral)  Resp 30  SpO2 93%  Physical Exam  Nursing note and vitals reviewed. Constitutional: He is oriented to person, place, and time. He appears well-developed and well-nourished. No distress.  HENT:  Head: Normocephalic.  Eyes: Conjunctivae and EOM are normal. Pupils are equal, round, and reactive to light.   Neck: Normal range of motion.  Cardiovascular: Normal rate.  An irregular rhythm present.  Pulmonary/Chest: Tachypnea noted. He has wheezes. He has no rales.  Abdominal: Soft. There is tenderness in the left lower quadrant. There is no rebound and no guarding.  Musculoskeletal: He exhibits no edema and no tenderness.  Neurological: He is alert and oriented to person, place, and time.  Skin: Skin is warm. No rash noted. He is diaphoretic.  Psychiatric: He has a normal mood and affect. His behavior is normal.    ED Course  Procedures (including critical care time)  Labs Reviewed  CBC - Abnormal; Notable for the following:    Platelets 96 (*) PLATELET COUNT CONFIRMED BY SMEAR   All other components within normal limits  DIFFERENTIAL - Abnormal; Notable for the following:    Neutrophils Relative 84 (*)    Lymphocytes Relative 5 (*)    Lymphs Abs 0.3 (*)    All other components within normal limits  POCT I-STAT, CHEM 8 - Abnormal; Notable for the following:    Glucose, Bld 191 (*)    All other components within normal limits  POCT I-STAT TROPONIN I  I-STAT TROPONIN I  I-STAT, CHEM 8   Results for orders placed during the hospital encounter of 08/15/11  CBC      Component Value Range   WBC 6.9  4.0 - 10.5 (K/uL)   RBC 4.61  4.22 - 5.81 (MIL/uL)   Hemoglobin 14.7  13.0 - 17.0 (g/dL)   HCT 40.9  81.1 - 91.4 (%)   MCV 90.5  78.0 - 100.0 (fL)   MCH 31.9  26.0 - 34.0 (pg)   MCHC 35.3  30.0 - 36.0 (g/dL)   RDW 78.2  95.6 - 21.3 (%)   Platelets 96 (*) 150 - 400 (K/uL)  DIFFERENTIAL      Component Value Range   Neutrophils Relative 84 (*) 43 - 77 (%)   Lymphocytes Relative 5 (*) 12 - 46 (%)   Monocytes Relative 10  3 - 12 (%)   Eosinophils Relative 1  0 - 5 (%)   Basophils Relative 0  0 - 1 (%)   Neutro Abs 5.8  1.7 - 7.7 (K/uL)   Lymphs Abs 0.3 (*) 0.7 - 4.0 (K/uL)   Monocytes Absolute 0.7  0.1 - 1.0 (K/uL)   Eosinophils Absolute 0.1  0.0 - 0.7 (K/uL)   Basophils Absolute 0.0   0.0 - 0.1 (  K/uL)   RBC Morphology POLYCHROMASIA PRESENT    POCT I-STAT, CHEM 8      Component Value Range   Sodium 139  135 - 145 (mEq/L)   Potassium 3.8  3.5 - 5.1 (mEq/L)   Chloride 104  96 - 112 (mEq/L)   BUN 15  6 - 23 (mg/dL)   Creatinine, Ser 1.61  0.50 - 1.35 (mg/dL)   Glucose, Bld 096 (*) 70 - 99 (mg/dL)   Calcium, Ion 0.45  4.09 - 1.32 (mmol/L)   TCO2 24  0 - 100 (mmol/L)   Hemoglobin 14.6  13.0 - 17.0 (g/dL)   HCT 81.1  91.4 - 78.2 (%)  POCT I-STAT TROPONIN I      Component Value Range   Troponin i, poc 0.00  0.00 - 0.08 (ng/mL)   Comment 3              Dg Chest 2 View  08/15/2011  *RADIOLOGY REPORT*  Clinical Data: Severe chest pain, cough.  CHEST - 2 VIEW  Comparison: 08/28/2004  Findings: Heart size upper normal limits to mildly enlarged.  Mild tortuosity to the thoracic aorta. Bilateral nodular densities projecting over the lower lungs presumably represent nipple shadows.  Mild hyperinflation and chronic interstitial prominence without focal consolidation.  No pleural effusion or pneumothorax. Multilevel degenerative changes.  No acute osseous abnormality.  IMPRESSION: Mild cardiomegaly.  Stable hyperinflation and interstitial markings.  No focal consolidation.  Original Report Authenticated By: Waneta Martins, M.D.     No diagnosis found.    MDM  4:30 AM patient seen and evaluated. Patient in no acute distress. Patient was also seen and discussed with attending physician.  Past medical records reviewed. Patient had a normal nuclear medicine stress test performed October 30th. Patient was seen by [facility] cardiology Dr. Myrtis Ser.  Dr. Nicanor Alcon will continue to follow pt.    Date: 08/15/2011  Rate: 104  Rhythm: sinus tachycardia and premature ventricular contractions (PVC)  QRS Axis: left  Intervals: normal  ST/T Wave abnormalities: nonspecific ST/T changes  Conduction Disutrbances:nonspecific intraventricular conduction delay  Narrative Interpretation:  Possible posterior infarct, borderline av conduction delay  Old EKG Reviewed: changes noted     Angus Seller, [place] 08/15/11 [phone]

## 2011-08-15 NOTE — Progress Notes (Signed)
ANTICOAGULATION CONSULT NOTE - Initial Consult  Pharmacy Consult for Heparin Indication: chest pain/ACS  Allergies  Allergen Reactions  . Toradol Rash    Patient Measurements: Height: 5\' 11"  (180.3 cm) Weight: 230 lb (104.327 kg) IBW/kg (Calculated) : 75.3  Heparin Dosing Weight: 97.2 kg  Vital Signs: Temp: 99.3 F (37.4 C) (12/21 0806) Temp src: Oral (12/21 0806) BP: 111/61 mmHg (12/21 1058) Pulse Rate: 92  (12/21 1058)  Labs:  Basename 08/15/11 0458 08/15/11 0443  HGB 14.6 14.7  HCT 43.0 41.7  PLT -- 96*  APTT -- --  LABPROT -- --  INR -- --  HEPARINUNFRC -- --  CREATININE 0.90 --  CKTOTAL -- --  CKMB -- --  TROPONINI -- --   Estimated Creatinine Clearance: 85.8 ml/min (by C-G formula based on Cr of 0.9).  Medical History: Past Medical History  Diagnosis Date  . Dizziness   . Fatigue     chronic  . HTN (hypertension)   . CVA (cerebral vascular accident)     Old left frontal infarct by MRI 2008  . Vertigo   . Prostate cancer     Dr.Wrenn  . AAA (abdominal aortic aneurysm)     Surgery Dr Arbie Cookey 2000. /  Ultrasound October, 2012, no significant abnormality, technically difficult  . Dyslipidemia     Triglycerides elevated  . Chest pain     October , 2012, Nuclear, no ischemia  . Elevated glucose   . Headache     October, 2012  . Ejection fraction     EF normal, nuclear, October, 2012    Medications:  Prescriptions prior to admission  Medication Sig Dispense Refill  . aspirin 325 MG tablet Take 325 mg by mouth daily.        . fish oil-omega-3 fatty acids 1000 MG capsule Take 2 g by mouth daily.        [name] guaiFENesin (MUCINEX) 600 MG 12 hr tablet Take 1,200 mg by mouth 2 (two) times daily.        [name] ibuprofen (ADVIL,MOTRIN) 600 MG tablet Take 1 tablet by mouth every 6 (six) hours as needed. For pain      . meclizine (ANTIVERT) 32 MG tablet Take 32 mg by mouth 3 (three) times daily as needed. For dizziness      . multivitamin-lutein (OCUVITE-LUTEIN)  CAPS Take 1 capsule by mouth daily.        . NON FORMULARY Flex-mint  -- 2 tabs daily       . RABEprazole (ACIPHEX) 20 MG tablet Take 20 mg by mouth daily.        . solifenacin (VESICARE) 5 MG tablet Take 5 mg by mouth daily.         Assessment: 77 yo M admitted with SSCP without radiation that lasted for >1 hour.  CP associated with diaphoresis and lightheadedness.  To start heparin.  Noted baseline PLTC=96.    Goal of Therapy:  Heparin level 0.3-0.7 units/ml   Plan:  Heparin 4000 units IV bolus x 1, followed by heparin infusion at 1300 units/hr.  Check 6 hours heparin level if patient has not already had cath procedure by that time. (Pt is currently on the add-on list for cath today).  [employer], Pharm.D., BCPS Clinical Pharmacist Pager [phone]  08/15/2011,11:35 AM

## 2011-08-15 NOTE — Op Note (Signed)
    Cardiac Cath Note  XYON LUKASIK [id number] May 24, 1933  Procedure: left Heart Cardiac Catheterization Note Indications: chest pain  Procedure Details Consent: Obtained Time Out: Verified patient identification, verified procedure, site/side was marked, verified correct patient position, special equipment/implants available, Radiology Safety Procedures followed,  medications/allergies/relevent history reviewed, required imaging and test results available.  Performed  The right femoral artery was easily canulated using a modified Seldinger technique.  Hemodynamics:    LV pressure: 113/22  Aortic pressure: 110/62  Angiography   The LM and LAD are heavily calcified.   All coronaries have moderate ectasia  Left Main: The left main is fairly vague naproxen sodium. The distal left main tapers to a 2030% stenosis at the takeoff of the LAD and circumflex artery.  Left anterior Descending: The left anterior descending artery is very heavily calcified. There is a proximal 60-70% stenosis followed by severely calcified 50% stenosis and then another 60-70% stenosis. The distal LAD appears to be fairly normal with only minor luminal or right leg is in his overall good target.  There 3 diagonal branches which originate in the proximal/mid LAD which have mild to moderate irregular she's.  Left Circumflex: The left circumflex artery is selectively small branch. It arises in the middle of the distal left main stenosis. The circumflex is a small to moderate size branching gives off a moderate-sized first obtuse marginal branch. The distal circumflex has minor luminal) allergies.  Right Coronary Artery: The right coronary artery is large and dominant. There is moderate diffuse disease throughout the right coronary artery. There is a mid stenosis of 40% in the mid RCA. Did The posterior descending artery and the posterior lateral segment artery are unremarkable.  LV Gram: The left ventriculogram  was performed in the 30 RAO position. It reveals normal left ventricular systolic function. Ejection fraction is proximal to 65%.  Complications: No apparent complications Patient did tolerate procedure well.  Conclusions:   1. Coronary artery disease the patient has moderate to severe coronary artery disease involving the left main and left anterior descending artery. This region is very heavily calcified. I think that he is a relatively poor candidate for percutaneous intervention. I discussed the case with Dr. Myrtis Ser and also with Dr. Vesta Mixer any period we will perform a flow wire evaluation of the LAD. If he is found to be ischemic, we will refer him to bypass grafting.  Vesta Mixer, Montez Hageman., MD, [facility] 08/15/2011, 3:18 PM

## 2011-08-15 NOTE — ED Notes (Signed)
CT called to report that IV not working.  RN going there to restart line.  Report given to [facility] before going to CT.

## 2011-08-15 NOTE — ED Notes (Signed)
Pt placed on monitor, continuous pulse oximetry, blood pressure cuff and oxygen [place] (2L); family at bedside 

## 2011-08-16 ENCOUNTER — Other Ambulatory Visit: Payer: Self-pay

## 2011-08-16 DIAGNOSIS — R079 Chest pain, unspecified: Secondary | ICD-10-CM

## 2011-08-16 DIAGNOSIS — D696 Thrombocytopenia, unspecified: Secondary | ICD-10-CM

## 2011-08-16 DIAGNOSIS — I2510 Atherosclerotic heart disease of native coronary artery without angina pectoris: Secondary | ICD-10-CM

## 2011-08-16 LAB — EXPECTORATED SPUTUM ASSESSMENT W GRAM STAIN, RFLX TO RESP C

## 2011-08-16 LAB — LIPID PANEL
HDL: 26 mg/dL — ABNORMAL LOW (ref >39)
Triglycerides: 106 mg/dL (ref <150)

## 2011-08-16 LAB — CBC
MCHC: 32.5 g/dL (ref 30.0–36.0)
MCV: 92.5 fL (ref 78.0–100.0)
Platelets: 77 10*3/uL — ABNORMAL LOW (ref 150–400)
RDW: 14.3 % (ref 11.5–15.5)
WBC: 3.8 10*3/uL — ABNORMAL LOW (ref 4.0–10.5)

## 2011-08-16 LAB — BASIC METABOLIC PANEL
Calcium: 8.7 mg/dL (ref 8.4–10.5)
Creatinine, Ser: 0.9 mg/dL (ref 0.50–1.35)
GFR calc Af Amer: >90 mL/min (ref >90)

## 2011-08-16 LAB — HEMOGLOBIN A1C: Hgb A1c MFr Bld: 6.4 % — ABNORMAL HIGH (ref <5.7)

## 2011-08-16 LAB — LACTATE DEHYDROGENASE: LDH: 258 U/L — ABNORMAL HIGH (ref 94–250)

## 2011-08-16 LAB — GLUCOSE, CAPILLARY: Glucose-Capillary: 137 mg/dL — ABNORMAL HIGH (ref 70–99)

## 2011-08-16 LAB — BILIRUBIN, FRACTIONATED(TOT/DIR/INDIR): Total Bilirubin: 0.7 mg/dL (ref 0.3–1.2)

## 2011-08-16 MED ORDER — INSULIN ASPART 100 UNIT/ML ~~LOC~~ SOLN
0.0000 [IU] | Freq: Three times a day (TID) | SUBCUTANEOUS | Status: DC
  Administered 2011-08-16 – 2011-08-17 (×2): 2 [IU] via SUBCUTANEOUS
  Administered 2011-08-17 – 2011-08-18 (×3): 3 [IU] via SUBCUTANEOUS
  Administered 2011-08-18 – 2011-08-20 (×5): 2 [IU] via SUBCUTANEOUS
  Administered 2011-08-20: 3 [IU] via SUBCUTANEOUS
  Filled 2011-08-16: qty 3

## 2011-08-16 MED ORDER — SODIUM CHLORIDE 0.9 % IV SOLN
INTRAVENOUS | Status: DC
  Administered 2011-08-16: 10 mL/h via INTRAVENOUS

## 2011-08-16 MED ORDER — POTASSIUM CHLORIDE CRYS ER 20 MEQ PO TBCR
40.0000 meq | EXTENDED_RELEASE_TABLET | Freq: Once | ORAL | Status: AC
  Administered 2011-08-16: 40 meq via ORAL
  Filled 2011-08-16: qty 2

## 2011-08-16 MED ORDER — INSULIN ASPART 100 UNIT/ML ~~LOC~~ SOLN
0.0000 [IU] | Freq: Every day | SUBCUTANEOUS | Status: DC
  Filled 2011-08-16: qty 3

## 2011-08-16 NOTE — Consult Note (Signed)
Reason for Consult:CAD Referring Physician: Dr. Pattricia Boss is an 77 y.o. male.  HPI: 77 yo male with history of AAA repair, glucose intolerance, tobacco abuse and dyslipidemia. Presented yesterday AM with acute onset 10/10 CP. Given ASA and NTG by EMS with relief pf pain. CT angio showed no evidence of PE or dissection, but did show calcification of LAD and L main. Cthed yesterday PM- found to have 3 vessel CAD with complex disease in distal L main and proximal LAD. Referred for CABG. No CP since admission. + productive cough last PM, better this AM.  Past Medical History  Diagnosis Date  . Dizziness   . Fatigue     chronic  . HTN (hypertension)   . Vertigo   . AAA (abdominal aortic aneurysm)     Surgery Dr Arbie Cookey 2000. /  Ultrasound October, 2012, no significant abnormality, technically difficult  . Dyslipidemia     Triglycerides elevated  . Chest pain     October , 2012, Nuclear, no ischemia  . Elevated glucose   . Headache     October, 2012  . Ejection fraction     EF normal, nuclear, October, 2012  . Prostate cancer     Dr.Wrenn; S/P radiation  . Pneumonia 1940's  . GERD (gastroesophageal reflux disease)   . Arthritis   . CVA (cerebral vascular accident)     Old left frontal infarct by MRI 2008    Past Surgical History  Procedure Date  . Abdominal aortic aneurysm repair ~ 2000  . Femoral artery aneurysm repair ~ 2000  . Cholecystectomy   . Knee surgery 2009    left; repair  . Inguinal hernia repair     X3?    Family History  Problem Relation Age of Onset  . Heart attack Mother   . Heart attack Father   . Heart attack Brother   . Prostate cancer Brother   . Colon cancer Brother     Social History:  reports that he quit smoking about 12 years ago. He has quit using smokeless tobacco. His smokeless tobacco use included Chew. He reports that he drinks alcohol. He reports that he does not use illicit drugs.  Allergies:  Allergies  Allergen Reactions    . Toradol Rash    Medications:  Prior to Admission:  Prescriptions prior to admission  Medication Sig Dispense Refill  . aspirin 325 MG tablet Take 325 mg by mouth daily.        . fish oil-omega-3 fatty acids 1000 MG capsule Take 2 g by mouth daily.        [name] guaiFENesin (MUCINEX) 600 MG 12 hr tablet Take 1,200 mg by mouth 2 (two) times daily.        [name] ibuprofen (ADVIL,MOTRIN) 600 MG tablet Take 1 tablet by mouth every 6 (six) hours as needed. For pain      . meclizine (ANTIVERT) 32 MG tablet Take 32 mg by mouth 3 (three) times daily as needed. For dizziness      . multivitamin-lutein (OCUVITE-LUTEIN) CAPS Take 1 capsule by mouth daily.        . NON FORMULARY Flex-mint  -- 2 tabs daily       . RABEprazole (ACIPHEX) 20 MG tablet Take 20 mg by mouth daily.        . solifenacin (VESICARE) 5 MG tablet Take 5 mg by mouth daily.         Results for orders placed during the hospital  encounter of 08/15/11 (from the past 48 hour(s))  CBC     Status: Abnormal   Collection Time   08/15/11  4:43 AM      Component Value Range Comment   WBC 6.9  4.0 - 10.5 (K/uL)    RBC 4.61  4.22 - 5.81 (MIL/uL)    Hemoglobin 14.7  13.0 - 17.0 (g/dL)    HCT 16.1  09.6 - 04.5 (%)    MCV 90.5  78.0 - 100.0 (fL)    MCH 31.9  26.0 - 34.0 (pg)    MCHC 35.3  30.0 - 36.0 (g/dL)    RDW 40.9  81.1 - 91.4 (%)    Platelets 96 (*) 150 - 400 (K/uL) PLATELET COUNT CONFIRMED BY SMEAR  DIFFERENTIAL     Status: Abnormal   Collection Time   08/15/11  4:43 AM      Component Value Range Comment   Neutrophils Relative 84 (*) 43 - 77 (%)    Lymphocytes Relative 5 (*) 12 - 46 (%)    Monocytes Relative 10  3 - 12 (%)    Eosinophils Relative 1  0 - 5 (%)    Basophils Relative 0  0 - 1 (%)    Neutro Abs 5.8  1.7 - 7.7 (K/uL)    Lymphs Abs 0.3 (*) 0.7 - 4.0 (K/uL)    Monocytes Absolute 0.7  0.1 - 1.0 (K/uL)    Eosinophils Absolute 0.1  0.0 - 0.7 (K/uL)    Basophils Absolute 0.0  0.0 - 0.1 (K/uL)    RBC Morphology  POLYCHROMASIA PRESENT     POCT I-STAT TROPONIN I     Status: Normal   Collection Time   08/15/11  4:55 AM      Component Value Range Comment   Troponin i, poc 0.00  0.00 - 0.08 (ng/mL)    Comment 3            POCT I-STAT, CHEM 8     Status: Abnormal   Collection Time   08/15/11  4:58 AM      Component Value Range Comment   Sodium 139  135 - 145 (mEq/L)    Potassium 3.8  3.5 - 5.1 (mEq/L)    Chloride 104  96 - 112 (mEq/L)    BUN 15  6 - 23 (mg/dL)    Creatinine, Ser 7.82  0.50 - 1.35 (mg/dL)    Glucose, Bld 956 (*) 70 - 99 (mg/dL)    Calcium, Ion 2.13  1.12 - 1.32 (mmol/L)    TCO2 24  0 - 100 (mmol/L)    Hemoglobin 14.6  13.0 - 17.0 (g/dL)    HCT 08.6  57.8 - 46.9 (%)   CARDIAC PANEL(CRET KIN+CKTOT+MB+TROPI)     Status: Normal   Collection Time   08/15/11 12:05 PM      Component Value Range Comment   Total CK 180  7 - 232 (U/L)    CK, MB 1.6  0.3 - 4.0 (ng/mL)    Troponin I <0.30  <0.30 (ng/mL)    Relative Index 0.9  0.0 - 2.5    HEMOGLOBIN A1C     Status: Abnormal   Collection Time   08/15/11 12:05 PM      Component Value Range Comment   Hemoglobin A1C 6.4 (*) <5.7 (%)    Mean Plasma Glucose 137 (*) <117 (mg/dL)   CARDIAC PANEL(CRET KIN+CKTOT+MB+TROPI)     Status: Normal   Collection Time   08/15/11  6:42  PM      Component Value Range Comment   Total CK 163  7 - 232 (U/L)    CK, MB 1.6  0.3 - 4.0 (ng/mL)    Troponin I <0.30  <0.30 (ng/mL)    Relative Index 1.0  0.0 - 2.5    BASIC METABOLIC PANEL     Status: Abnormal   Collection Time   08/16/11  3:30 AM      Component Value Range Comment   Sodium 135  135 - 145 (mEq/L)    Potassium 3.4 (*) 3.5 - 5.1 (mEq/L)    Chloride 102  96 - 112 (mEq/L)    CO2 24  19 - 32 (mEq/L)    Glucose, Bld 208 (*) 70 - 99 (mg/dL)    BUN 19  6 - 23 (mg/dL)    Creatinine, Ser 1.61  0.50 - 1.35 (mg/dL)    Calcium 8.7  8.4 - 10.5 (mg/dL)    GFR calc non Af Amer 81 (*) >90 (mL/min)    GFR calc Af Amer >90  >90 (mL/min)   CBC     Status:  Abnormal   Collection Time   08/16/11  3:30 AM      Component Value Range Comment   WBC 3.8 (*) 4.0 - 10.5 (K/uL)    RBC 3.99 (*) 4.22 - 5.81 (MIL/uL)    Hemoglobin 12.0 (*) 13.0 - 17.0 (g/dL)    HCT 09.6 (*) 04.5 - 52.0 (%)    MCV 92.5  78.0 - 100.0 (fL)    MCH 30.1  26.0 - 34.0 (pg)    MCHC 32.5  30.0 - 36.0 (g/dL)    RDW 40.9  81.1 - 91.4 (%)    Platelets 77 (*) 150 - 400 (K/uL) CONSISTENT WITH PREVIOUS RESULT  LIPID PANEL     Status: Abnormal   Collection Time   08/16/11  3:30 AM      Component Value Range Comment   Cholesterol 105  0 - 200 (mg/dL)    Triglycerides 782  <150 (mg/dL)    HDL 26 (*) >95 (mg/dL)    Total CHOL/HDL Ratio 4.0      VLDL 21  0 - 40 (mg/dL)    LDL Cholesterol 58  0 - 99 (mg/dL)   CULTURE, SPUTUM-ASSESSMENT     Status: Normal   Collection Time   08/16/11  5:07 AM      Component Value Range Comment   Specimen Description SPUTUM      Special Requests NONE      Sputum evaluation        Value: THIS SPECIMEN IS ACCEPTABLE. RESPIRATORY CULTURE REPORT TO FOLLOW.   Report Status 08/16/2011 FINAL       Dg Chest 2 View  08/15/2011  *RADIOLOGY REPORT*  Clinical Data: Severe chest pain, cough.  CHEST - 2 VIEW  Comparison: 08/28/2004  Findings: Heart size upper normal limits to mildly enlarged.  Mild tortuosity to the thoracic aorta. Bilateral nodular densities projecting over the lower lungs presumably represent nipple shadows.  Mild hyperinflation and chronic interstitial prominence without focal consolidation.  No pleural effusion or pneumothorax. Multilevel degenerative changes.  No acute osseous abnormality.  IMPRESSION: Mild cardiomegaly.  Stable hyperinflation and interstitial markings.  No focal consolidation.  Original Report Authenticated By: Waneta Martins, M.D.   Ct Angio Chest W/cm &/or Wo Cm  08/15/2011  *RADIOLOGY REPORT*  Clinical Data:  Chest pain and shortness of breath.  CT ANGIOGRAPHY CHEST WITH CONTRAST  Technique:  Multidetector CT  imaging of the chest was performed using the standard protocol during bolus administration of intravenous contrast.  Multiplanar CT image reconstructions including MIPs were obtained to evaluate the vascular anatomy.  Contrast: OMNIPAQUE IOHEXOL 350 MG/ML IV SOLN  Comparison:  Chest x-ray 08/15/2011.  Findings:  Mild respiratory motion degrades images through the lung bases.  Overall study diagnostic.  Good opacification of the central pulmonary vasculature.  No evidence for central pulmonary embolic disease. A tiny peripheral embolus could be obscured but is unlikely to be clinically significant.  Cardiomegaly.  Advanced coronary artery calcification most notable at the origin of the left main coronary artery and left anterior descending.  No pleural or pericardial effusion.  No hilar or mediastinal adenopathy.  Mild interstitial prominence may represent chronic lung disease but there are no areas of lobar consolidation or significant atelectasis.  Multilevel spondylosis.  No acute findings in the upper abdomen.  Previous cholecystectomy.  Right renal cyst incompletely evaluated.  Review of the MIP images confirms the above findings.  IMPRESSION: No central pulmonary embolus is observed.  Cardiomegaly with advanced coronary calcification.  Original Report Authenticated By: Elsie Stain, M.D.    Review of Systems  Constitutional: Positive for malaise/fatigue. Negative for fever, chills, weight loss and diaphoresis.  Respiratory: Positive for cough and sputum production. Negative for hemoptysis.   Cardiovascular: Positive for chest pain and claudication (bad circulation Left leg). Negative for palpitations and orthopnea.  Gastrointestinal: Positive for diarrhea.       Ventral hernia Rectal incontinence  Genitourinary: Positive for urgency and frequency.       Prostate CA  Neurological: Positive for dizziness. Negative for weakness.  Endo/Heme/Allergies: Does not bruise/bleed easily.   Psychiatric/Behavioral: Negative.   All other systems reviewed and are negative.   Blood pressure 124/61, pulse 64, temperature 99.3 F (37.4 C), temperature source Oral, resp. rate 26, height 5\' 11"  (1.803 m), weight 103 kg (227 lb 1.2 oz), SpO2 97.00%. Physical Exam  Vitals reviewed. Constitutional: He is oriented to person, place, and time. He appears well-developed and well-nourished. No distress.  HENT:  Head: Normocephalic and atraumatic.  Eyes: EOM are normal. Pupils are equal, round, and reactive to light.  Neck: No JVD present. No tracheal deviation present. No thyromegaly present.  Cardiovascular: Normal rate, regular rhythm and normal heart sounds.        1 + PT R, unable to palpate L  Respiratory: Effort normal and breath sounds normal.  GI: Soft. There is no tenderness.  Musculoskeletal: He exhibits no edema.  Lymphadenopathy:    He has no cervical adenopathy.  Neurological: He is alert and oriented to person, place, and time. No cranial nerve deficit.  Skin: Skin is warm and dry.  Psychiatric: He has a normal mood and affect.    Assessment/Plan: 77 yo with multiple CRF. Presents with unstable coronary syndrome and has significant CAD by cath. CABG indicated for survival benefit and relief of symptoms.   I have discussed with the patient and his family the general nature of the procedure, need for general anesthesia,and incisions to be used. I have discussed the expected hospital stay, overall recovery and short and long term outcomes. They understand the risks include but are not limited to death, stroke, MI, DVT/PE, bleeding, possible need for transfusion, infections, other organ system dysfunction including respiratory, renal, or GI complications. They understand and accept these risks and agree to proceed with surgery.  He will be seen by Dr. Donnie Coffin to workup  thrombocytopenia.  Plan CABG next week- date and surgeon TBD.   HENDRICKSON,STEVEN C 08/16/2011, 11:51 AM

## 2011-08-16 NOTE — H&P (Signed)
History and Physical   Patient ID: [name] MRN: [id number], DOB/AGE: [birth year]   Admit date: 08/15/2011 Date of Consult: 08/16/2011   Primary Physician: Dolores Hoose, OTR Primary Cardiologist: Jerral Bonito, MD  Patient denies chest pain of SOB; complains of productive cough  Problem List: Past Medical History  Diagnosis Date  . Dizziness   . Fatigue     chronic  . HTN (hypertension)   . Vertigo   . AAA (abdominal aortic aneurysm)     Surgery Dr Arbie Cookey 2000. /  Ultrasound October, 2012, no significant abnormality, technically difficult  . Dyslipidemia     Triglycerides elevated  . Chest pain     October , 2012, Nuclear, no ischemia  . Elevated glucose   . Headache     October, 2012  . Ejection fraction     EF normal, nuclear, October, 2012  . Prostate cancer     Dr.Wrenn; S/P radiation  . Pneumonia 1940's  . GERD (gastroesophageal reflux disease)   . Arthritis   . CVA (cerebral vascular accident)     Old left frontal infarct by MRI 2008    Past Surgical History  Procedure Date  . Abdominal aortic aneurysm repair ~ 2000  . Femoral artery aneurysm repair ~ 2000  . Cholecystectomy   . Knee surgery 2009    left; repair  . Inguinal hernia repair     X3?     Allergies:  Allergies  Allergen Reactions  . Toradol Rash      Inpatient Medications:     . aspirin EC  81 mg Oral Daily  . azithromycin  500 mg Oral Daily   Followed by  . azithromycin  250 mg Oral Daily  . diazepam  5 mg Oral On Call  . fentaNYL      . heparin      . heparin      . heparin  4,000 Units Intravenous Once  . lidocaine      . metoprolol tartrate  25 mg Oral BID  . midazolam      . multivitamin  1 tablet Oral Daily  . nitroGLYCERIN      . omega-3 acid ethyl esters  2 g Oral Daily  . rosuvastatin  40 mg Oral NOW   Followed by  . rosuvastatin  40 mg Oral q1800  . DISCONTD: adenosine  16 mL Intravenous Once  . DISCONTD: darifenacin  7.5 mg Oral Daily  .  DISCONTD: fish oil-omega-3 fatty acids  2 g Oral Daily  . DISCONTD: guaiFENesin  1,200 mg Oral BID  . DISCONTD: multivitamin-lutein  1 capsule Oral Daily  . DISCONTD: sodium chloride  3 mL Intravenous Q12H  . DISCONTD: sodium chloride  3 mL Intravenous Q12H   Prescriptions prior to admission  Medication Sig Dispense Refill  . aspirin 325 MG tablet Take 325 mg by mouth daily.        . fish oil-omega-3 fatty acids 1000 MG capsule Take 2 g by mouth daily.        [name] guaiFENesin (MUCINEX) 600 MG 12 hr tablet Take 1,200 mg by mouth 2 (two) times daily.        [name] ibuprofen (ADVIL,MOTRIN) 600 MG tablet Take 1 tablet by mouth every 6 (six) hours as needed. For pain      . meclizine (ANTIVERT) 32 MG tablet Take 32 mg by mouth 3 (three) times daily as needed. For dizziness      . multivitamin-lutein (OCUVITE-LUTEIN)  CAPS Take 1 capsule by mouth daily.        . NON FORMULARY Flex-mint  -- 2 tabs daily       . RABEprazole (ACIPHEX) 20 MG tablet Take 20 mg by mouth daily.        . solifenacin (VESICARE) 5 MG tablet Take 5 mg by mouth daily.         Family History  Problem Relation Age of Onset  . Heart attack Mother   . Heart attack Father   . Heart attack Brother   . Prostate cancer Brother   . Colon cancer Brother      History   Social History  . Marital Status: Married    Spouse Name: N/A    Number of Children: 4  . Years of Education: N/A   Occupational History  . retired    Social History Main Topics  . Smoking status: Former Smoker -- 3.0 packs/day for 50 years    Quit date: 08/25/1998  . Smokeless tobacco: Former Neurosurgeon    Types: Chew   Comment: quit smoking cigarettes "2000"  . Alcohol Use: Yes     08/15/11 "last alcohol was too long ago to count"  . Drug Use: No  . Sexually Active: No   Other Topics Concern  . Not on file   Social History Narrative  . No narrative on file      Physical Exam: Blood pressure 124/61, pulse 64, temperature 99.3 F (37.4 C), temperature  source Oral, resp. rate 26, height 5\' 11"  (1.803 m), weight 227 lb 1.2 oz (103 kg), SpO2 97.00%.   General: Well developed, well nourished, NAD Neck:  Supple.  Lungs: Clear bilaterally to auscultation without wheezes, rales, or rhonchi.  Heart: RRR with S1 S2. No murmurs, rubs, or gallops appreciated. Abdomen: Soft, non-tender, non-distended with normoactive bowel sounds. No hepatomegaly. No rebound/guarding. No obvious abdominal masses. Right groin with no hematoma and no bruit Extremities: No edema.  Distal pedal pulses are 2+ and equal bilaterally. Neuro: Alert and oriented X 3. Moves all extremities spontaneously.  Labs: Recent Labs  Basename 08/16/11 0330 08/15/11 0458 08/15/11 0443   WBC 3.8* -- 6.9   HGB 12.0* 14.6 --   HCT 36.9* 43.0 --   MCV 92.5 -- 90.5   PLT 77* -- 96*    Lab 08/16/11 0330 08/15/11 0458  NA 135 139  K 3.4* 3.8  CL 102 104  CO2 24 --  BUN 19 15  CREATININE 0.90 0.90  CALCIUM 8.7 --  PROT -- --  BILITOT -- --  ALKPHOS -- --  ALT -- --  AST -- --  AMYLASE -- --  LIPASE -- --  GLUCOSE 208* 191*   Radiology/Studies: Dg Chest 2 View  08/15/2011  *RADIOLOGY REPORT*  Clinical Data: Severe chest pain, cough.  CHEST - 2 VIEW  Comparison: 08/28/2004  Findings: Heart size upper normal limits to mildly enlarged.  Mild tortuosity to the thoracic aorta. Bilateral nodular densities projecting over the lower lungs presumably represent nipple shadows.  Mild hyperinflation and chronic interstitial prominence without focal consolidation.  No pleural effusion or pneumothorax. Multilevel degenerative changes.  No acute osseous abnormality.  IMPRESSION: Mild cardiomegaly.  Stable hyperinflation and interstitial markings.  No focal consolidation.  Original Report Authenticated By: Waneta Martins, M.D.   Ct Angio Chest W/cm &/or Wo Cm  08/15/2011  *RADIOLOGY REPORT*  Clinical Data:  Chest pain and shortness of breath.  CT ANGIOGRAPHY CHEST WITH CONTRAST  Technique:   Multidetector CT imaging of the chest was performed using the standard protocol during bolus administration of intravenous contrast.  Multiplanar CT image reconstructions including MIPs were obtained to evaluate the vascular anatomy.  Contrast: OMNIPAQUE IOHEXOL 350 MG/ML IV SOLN  Comparison:  Chest x-ray 08/15/2011.  Findings:  Mild respiratory motion degrades images through the lung bases.  Overall study diagnostic.  Good opacification of the central pulmonary vasculature.  No evidence for central pulmonary embolic disease. A tiny peripheral embolus could be obscured but is unlikely to be clinically significant.  Cardiomegaly.  Advanced coronary artery calcification most notable at the origin of the left main coronary artery and left anterior descending.  No pleural or pericardial effusion.  No hilar or mediastinal adenopathy.  Mild interstitial prominence may represent chronic lung disease but there are no areas of lobar consolidation or significant atelectasis.  Multilevel spondylosis.  No acute findings in the upper abdomen.  Previous cholecystectomy.  Right renal cyst incompletely evaluated.  Review of the MIP images confirms the above findings.  IMPRESSION: No central pulmonary embolus is observed.  Cardiomegaly with advanced coronary calcification.  Original Report Authenticated By: Elsie Stain, M.D.    EKG: Sinus tachycardia, 104 bpm, possible posterior infarct  ASSESSMENT AND PLAN:   1. Chest pain- Cath results noted; plan CVTS consult for CABG; transfer to telemetry. Continue ASA, metoprolol and statin.  2. HTN- Continue present meds.  3. Thrombocytopenia Heme consult; appears to be chronic when reviewing past labs; will need evaluation prior to CABG. 4. Bronchitis - continue antibiotics  Olga Millers, MD, [facility] 08/16/2011 8:09 AM

## 2011-08-16 NOTE — Progress Notes (Signed)
Cardiac Rehab Phase I- order received and appreciated, chart reviewed.  Please clarify if ambulation needed prior to CABG next week.  Will follow up s/p CABG if not.  Thanks for the referral.

## 2011-08-16 NOTE — Progress Notes (Addendum)
Referral MD Dr Myrtis Ser Reason for Referral: Thrombocytopenia  Chief Complaint  Patient presents with  . Chest Pain  :Asymptomatic thrombocytopenia  This is a 77 yo man from [place] who presents with chest pain , s/p cath showing significant LAD disease requiring CAB. He was found to have low plts to 97k, which have fallen to 77k, post cath, where he did receive heparin. He has been asymptomatic wrt bleeding and bruising. He has undergone previous surgeries, including AAA repair in 2000. Looking back, his plt counts have been ~100k range, going back to 2011. Prior to that his plt counts have been relatively normal, though rarely above 150k. He has no family hx of the above. He has not been having other issues apart fromt he chest pain that brought him into the hospital. HPI:   Past Medical History  Diagnosis Date  . Dizziness   . Fatigue     chronic  . HTN (hypertension)   . Vertigo   . AAA (abdominal aortic aneurysm)     Surgery Dr Arbie Cookey 2000. /  Ultrasound October, 2012, no significant abnormality, technically difficult  . Dyslipidemia     Triglycerides elevated  . Chest pain     October , 2012, Nuclear, no ischemia  . Elevated glucose   . Headache     October, 2012  . Ejection fraction     EF normal, nuclear, October, 2012  . Prostate cancer     Dr.Wrenn; S/P radiation  . Pneumonia 1940's  . GERD (gastroesophageal reflux disease)   . Arthritis   . CVA (cerebral vascular accident)     Old left frontal infarct by MRI 2008  :  Past Surgical History  Procedure Date  . Abdominal aortic aneurysm repair ~ 2000  . Femoral artery aneurysm repair ~ 2000  . Cholecystectomy   . Knee surgery 2009    left; repair  . Inguinal hernia repair     X3?  gall Bladder removal:  Current facility-administered medications:0.9 %  sodium chloride infusion, , Intravenous, Continuous, Verne Carrow, MD;  0.9 %  sodium chloride infusion, , Intravenous, Continuous, Luis Abed, MD,  Last Rate: 10 mL/hr at 08/16/11 0700, 10 mL/hr at 08/16/11 0700;  albuterol (PROVENTIL) (5 MG/ML) 0.5% nebulizer solution 2.5 mg, 2.5 mg, Nebulization, Q6H PRN, Zacarias Pontes, 2.5 mg at 08/16/11 0007 aspirin EC tablet 81 mg, 81 mg, Oral, Daily, Roger Arguello, 81 mg at 08/16/11 0943;  azithromycin (ZITHROMAX) tablet 250 mg, 250 mg, Oral, Daily, Roger Arguello, 250 mg at 08/16/11 0944;  fentaNYL (SUBLIMAZE) 0.05 MG/ML injection, , , , ;  guaiFENesin (ROBITUSSIN) 100 MG/5ML solution 100 mg, 5 mL, Oral, Q4H PRN, Luis Abed, MD, 100 mg at 08/16/11 0716;  heparin 1000 UNIT/ML injection, , , , ;  heparin 2-0.9 UNIT/ML-% infusion, , , ,  lidocaine (XYLOCAINE) 1 % injection, , , , ;  metoprolol tartrate (LOPRESSOR) tablet 25 mg, 25 mg, Oral, BID, Roger Arguello, 25 mg at 08/16/11 0945;  midazolam (VERSED) 2 MG/2ML injection, , , , ;  multivitamin (PROSIGHT) tablet 1 tablet, 1 tablet, Oral, Daily, Elwin Sleight, PHARMD, 1 tablet at 08/15/11 1241;  nitroGLYCERIN (NITROSTAT) SL tablet 0.4 mg, 0.4 mg, Sublingual, Q5 min PRN, Roger Arguello nitroGLYCERIN (NTG ON-CALL) 0.2 mg/mL injection, , , , ;  nitroGLYCERIN 0.2 mg/mL in dextrose 5 % infusion, 3-30 mcg/min, Intravenous, Titrated, Roger Arguello, Last Rate: 3 mL/hr at 08/16/11 0700, 10 mcg/min at 08/16/11 0700;  omega-3 acid ethyl esters (LOVAZA) capsule  2 g, 2 g, Oral, Daily, Elwin Sleight, PHARMD, 2 g at 08/16/11 0946;  ondansetron [facility]) injection 4 mg, 4 mg, Intravenous, Q6H PRN, Roger Arguello potassium chloride SA (K-DUR,KLOR-CON) CR tablet 40 mEq, 40 mEq, Oral, Once, Lewayne Bunting, MD, 40 mEq at 08/16/11 0947;  rosuvastatin (CRESTOR) tablet 40 mg, 40 mg, Oral, q1800, Odella Aquas;  DISCONTD: 0.9 %  sodium chloride infusion, 250 mL, Intravenous, PRN, Odella Aquas;  DISCONTD: 0.9 %  sodium chloride infusion, , Intravenous, Continuous, Odella Aquas, Last Rate: 100 mL/hr at 08/15/11 1139 DISCONTD: acetaminophen (TYLENOL) tablet 650 mg, 650 mg, Oral,  Q4H PRN, Odella Aquas;  DISCONTD: acetaminophen (TYLENOL) tablet 650 mg, 650 mg, Oral, Q4H PRN, Verne Carrow, MD, 650 mg at 08/16/11 1610;  DISCONTD: adenosine (ADENOCARD) 12 MG/4ML injection 48 mg, 16 mL, Intravenous, Once, Laurena Bering, PHARMD;  DISCONTD: darifenacin (ENABLEX) 24 hr tablet 7.5 mg, 7.5 mg, Oral, Daily, Roger Arguello, 7.5 mg at 08/15/11 1241 DISCONTD: guaiFENesin (MUCINEX) 12 hr tablet 1,200 mg, 1,200 mg, Oral, BID, Roger Arguello, 1,200 mg at 08/15/11 1241;  DISCONTD: guaiFENesin-dextromethorphan (ROBITUSSIN DM) 100-10 MG/5ML syrup 5 mL, 5 mL, Oral, Q4H PRN, Odella Aquas;  DISCONTD: heparin ADULT infusion 100 units/ml (25000 units/250 ml), 1,300 Units/hr, Intravenous, Continuous, Kimberly Ballard Hammons, PHARMD, Last Rate: 13 mL/hr at 08/15/11 1332, 1,300 Units/hr at 08/15/11 1332 DISCONTD: meclizine (ANTIVERT) tablet 31.25 mg, 31.25 mg, Oral, TID PRN, Odella Aquas;  DISCONTD: sodium chloride 0.9 % injection 3 mL, 3 mL, Intravenous, Q12H, Roger Arguello, 3 mL at 08/15/11 1245;  DISCONTD: sodium chloride 0.9 % injection 3 mL, 3 mL, Intravenous, PRN, Roger Arguello:     . aspirin EC  81 mg Oral Daily  . azithromycin  250 mg Oral Daily  . fentaNYL      . heparin      . heparin      . lidocaine      . metoprolol tartrate  25 mg Oral BID  . midazolam      . multivitamin  1 tablet Oral Daily  . nitroGLYCERIN      . omega-3 acid ethyl esters  2 g Oral Daily  . potassium chloride  40 mEq Oral Once  . rosuvastatin  40 mg Oral q1800  . DISCONTD: adenosine  16 mL Intravenous Once  . DISCONTD: darifenacin  7.5 mg Oral Daily  . DISCONTD: guaiFENesin  1,200 mg Oral BID  . DISCONTD: sodium chloride  3 mL Intravenous Q12H  :  Allergies  Allergen Reactions  . Toradol Rash  :  Family History  Problem Relation Age of Onset  . Heart attack Mother   . Heart attack Father   . Heart attack Brother   . Prostate cancer Brother   . Colon cancer Brother   :  History    Social History  . Marital Status: Married    Spouse Name: N/A    Number of Children: 4  . Years of Education: N/A   Occupational History  . retired    Social History Main Topics  . Smoking status: Former Smoker -- 3.0 packs/day for 50 years    Quit date: 08/25/1998  . Smokeless tobacco: Former Neurosurgeon    Types: Chew   Comment: quit smoking cigarettes "2000"  . Alcohol Use: Yes     08/15/11 "last alcohol was too long ago to count"  . Drug Use: No  . Sexually Active: No   Other Topics Concern  . Not on file   Social  History Narrative  . No narrative on file  :  Respiratory: positive for cough  Exam: Patient Vitals for the past 24 hrs:  BP Temp Temp src Pulse Resp SpO2 Weight  08/16/11 0731 124/61 mmHg 99.3 F (37.4 C) Oral 64  26  97 % -  08/16/11 0513 114/60 mmHg 98.4 F (36.9 C) Oral 75  17  96 % -  08/16/11 0007 - - - 65  27  97 % -  08/15/11 2312 124/60 mmHg 100.1 F (37.8 C) Oral 73  27  95 % 227 lb 1.2 oz (103 kg)  08/15/11 2118 109/56 mmHg - - 89  - - -  08/15/11 2045 127/55 mmHg - - 93  24  93 % -  08/15/11 2044 127/55 mmHg 99.2 F (37.3 C) Oral 91  27  94 % -  08/15/11 1930 116/57 mmHg - - 90  26  95 % -  08/15/11 1920 119/68 mmHg - - 101  28  96 % -  08/15/11 1915 122/69 mmHg - - 91  27  97 % -  08/15/11 1910 115/75 mmHg - - 82  26  97 % -  08/15/11 1905 121/59 mmHg - - 76  22  93 % -  08/15/11 1900 124/53 mmHg - - 78  28  96 % -  08/15/11 1855 103/68 mmHg - - 72  17  97 % -  08/15/11 1850 107/51 mmHg - - 69  22  97 % -  08/15/11 1845 116/66 mmHg - - 71  15  97 % -  08/15/11 1830 105/62 mmHg - - 75  25  98 % -  08/15/11 1800 114/43 mmHg - - 73  26  98 % -  08/15/11 1730 114/60 mmHg - - 84  26  97 % -  08/15/11 1700 118/74 mmHg - - 83  24  88 % -  08/15/11 1643 119/64 mmHg 98.4 F (36.9 C) Oral 79  24  91 % -   General appearance: alert, cooperative and appears stated age Throat: lips, mucosa, and tongue normal; teeth and gums normal Resp: clear to  auscultation bilaterally and normal percussion bilaterally Cardio: regular rate and rhythm, S1, S2 normal, no murmur, click, rub or gallop GI: soft, non-tender; bowel sounds normal; no masses,  no organomegaly Extremities: ecchtmoses on hands and arms Lymph nodes: Cervical, supraclavicular, and axillary nodes normal.   Basename 08/16/11 0330 08/15/11 0458 08/15/11 0443  WBC 3.8* -- 6.9  HGB 12.0* 14.6 --  HCT 36.9* 43.0 --  PLT 77* -- 96*    Basename 08/16/11 0330 08/15/11 0458  NA 135 139  K 3.4* 3.8  CL 102 104  CO2 24 --  GLUCOSE 208* 191*  BUN 19 15  CREATININE 0.90 0.90  CALCIUM 8.7 --    Blood smear review: pending  Pathology:n/a  Dg Chest 2 View  08/15/2011  *RADIOLOGY REPORT*  Clinical Data: Severe chest pain, cough.  CHEST - 2 VIEW  Comparison: 08/28/2004  Findings: Heart size upper normal limits to mildly enlarged.  Mild tortuosity to the thoracic aorta. Bilateral nodular densities projecting over the lower lungs presumably represent nipple shadows.  Mild hyperinflation and chronic interstitial prominence without focal consolidation.  No pleural effusion or pneumothorax. Multilevel degenerative changes.  No acute osseous abnormality.  IMPRESSION: Mild cardiomegaly.  Stable hyperinflation and interstitial markings.  No focal consolidation.  Original Report Authenticated By: Waneta Martins, M.D.   Ct Angio Chest W/cm &/or Wo  Cm  08/15/2011  *RADIOLOGY REPORT*  Clinical Data:  Chest pain and shortness of breath.  CT ANGIOGRAPHY CHEST WITH CONTRAST  Technique:  Multidetector CT imaging of the chest was performed using the standard protocol during bolus administration of intravenous contrast.  Multiplanar CT image reconstructions including MIPs were obtained to evaluate the vascular anatomy.  Contrast: OMNIPAQUE IOHEXOL 350 MG/ML IV SOLN  Comparison:  Chest x-ray 08/15/2011.  Findings:  Mild respiratory motion degrades images through the lung bases.  Overall study  diagnostic.  Good opacification of the central pulmonary vasculature.  No evidence for central pulmonary embolic disease. A tiny peripheral embolus could be obscured but is unlikely to be clinically significant.  Cardiomegaly.  Advanced coronary artery calcification most notable at the origin of the left main coronary artery and left anterior descending.  No pleural or pericardial effusion.  No hilar or mediastinal adenopathy.  Mild interstitial prominence may represent chronic lung disease but there are no areas of lobar consolidation or significant atelectasis.  Multilevel spondylosis.  No acute findings in the upper abdomen.  Previous cholecystectomy.  Right renal cyst incompletely evaluated.  Review of the MIP images confirms the above findings.  IMPRESSION: No central pulmonary embolus is observed.  Cardiomegaly with advanced coronary calcification.  Original Report Authenticated By: Elsie Stain, M.D.    Assessment and Plan:   Mr Colden has had low plts going a few years. i will review smear and r/o a few other possible etiologies ie HIT etc. Less likely this represents TTP. Low grade, chronic ITP or evolving MDS are possibilities.  I will review smear as well. If plts are stable at this range, i feel CAB, would be relatively safe, with plt transfusions should counts fall further. It might be prudent to perform a bone marrow biopsy prior to surgery, to r/o other etiologies I will continue to follow. Addendum Peripheral blood smear No schistocytes, some large plts seen, few atypical lymphocytes, no abnormal RBC morph    Pierce Crane MD [phone]                Pierce Crane MD

## 2011-08-17 ENCOUNTER — Other Ambulatory Visit: Payer: Self-pay

## 2011-08-17 DIAGNOSIS — R079 Chest pain, unspecified: Secondary | ICD-10-CM

## 2011-08-17 LAB — BASIC METABOLIC PANEL
BUN: 17 mg/dL (ref 6–23)
Calcium: 8.8 mg/dL (ref 8.4–10.5)
Creatinine, Ser: 0.81 mg/dL (ref 0.50–1.35)
GFR calc non Af Amer: 84 mL/min — ABNORMAL LOW (ref >90)
Glucose, Bld: 132 mg/dL — ABNORMAL HIGH (ref 70–99)
Sodium: 138 mEq/L (ref 135–145)

## 2011-08-17 LAB — CBC
MCH: 31.5 pg (ref 26.0–34.0)
MCHC: 34.3 g/dL (ref 30.0–36.0)
Platelets: 89 10*3/uL — ABNORMAL LOW (ref 150–400)
RDW: 14.4 % (ref 11.5–15.5)

## 2011-08-17 NOTE — H&P (Signed)
History and Physical   Patient ID: [name] MRN: [id number], DOB/AGE: 03-07-1933   Admit date: 08/15/2011 Date of Consult: 08/17/2011   Primary Physician: Dolores Hoose, OTR Primary Cardiologist: Jerral Bonito, MD  Patient denies chest pain of SOB; complains of productive cough  Problem List: Past Medical History  Diagnosis Date  . Dizziness   . Fatigue     chronic  . HTN (hypertension)   . Vertigo   . AAA (abdominal aortic aneurysm)     Surgery Dr Arbie Cookey 2000. /  Ultrasound October, 2012, no significant abnormality, technically difficult  . Dyslipidemia     Triglycerides elevated  . Chest pain     October , 2012, Nuclear, no ischemia  . Elevated glucose   . Headache     October, 2012  . Ejection fraction     EF normal, nuclear, October, 2012  . Prostate cancer     Dr.Wrenn; S/P radiation  . Pneumonia 1940's  . GERD (gastroesophageal reflux disease)   . Arthritis   . CVA (cerebral vascular accident)     Old left frontal infarct by MRI 2008    Past Surgical History  Procedure Date  . Abdominal aortic aneurysm repair ~ 2000  . Femoral artery aneurysm repair ~ 2000  . Cholecystectomy   . Knee surgery 2009    left; repair  . Inguinal hernia repair     X3?     Allergies:  Allergies  Allergen Reactions  . Toradol Rash      Inpatient Medications:     . aspirin EC  81 mg Oral Daily  . azithromycin  250 mg Oral Daily  . insulin aspart  0-15 Units Subcutaneous TID WC  . insulin aspart  0-5 Units Subcutaneous QHS  . metoprolol tartrate  25 mg Oral BID  . multivitamin  1 tablet Oral Daily  . omega-3 acid ethyl esters  2 g Oral Daily  . potassium chloride  40 mEq Oral Once  . rosuvastatin  40 mg Oral q1800   Prescriptions prior to admission  Medication Sig Dispense Refill  . aspirin 325 MG tablet Take 325 mg by mouth daily.        . fish oil-omega-3 fatty acids 1000 MG capsule Take 2 g by mouth daily.        [name] guaiFENesin (MUCINEX) 600  MG 12 hr tablet Take 1,200 mg by mouth 2 (two) times daily.        [name] ibuprofen (ADVIL,MOTRIN) 600 MG tablet Take 1 tablet by mouth every 6 (six) hours as needed. For pain      . meclizine (ANTIVERT) 32 MG tablet Take 32 mg by mouth 3 (three) times daily as needed. For dizziness      . multivitamin-lutein (OCUVITE-LUTEIN) CAPS Take 1 capsule by mouth daily.        . NON FORMULARY Flex-mint  -- 2 tabs daily       . RABEprazole (ACIPHEX) 20 MG tablet Take 20 mg by mouth daily.        . solifenacin (VESICARE) 5 MG tablet Take 5 mg by mouth daily.         Family History  Problem Relation Age of Onset  . Heart attack Mother   . Heart attack Father   . Heart attack Brother   . Prostate cancer Brother   . Colon cancer Brother      History   Social History  . Marital Status: Married  Spouse Name: N/A    Number of Children: 4  . Years of Education: N/A   Occupational History  . retired    Social History Main Topics  . Smoking status: Former Smoker -- 3.0 packs/day for 50 years    Quit date: 08/25/1998  . Smokeless tobacco: Former Neurosurgeon    Types: Chew   Comment: quit smoking cigarettes "2000"  . Alcohol Use: Yes     08/15/11 "last alcohol was too long ago to count"  . Drug Use: No  . Sexually Active: No   Other Topics Concern  . Not on file   Social History Narrative  . No narrative on file      Physical Exam: Blood pressure 103/55, pulse 63, temperature 98.7 F (37.1 C), temperature source Oral, resp. rate 18, height 5\' 11"  (1.803 m), weight 219 lb 5.7 oz (99.5 kg), SpO2 93.00%.   General: Well developed, well nourished, NAD Neck:  Supple.  Lungs: Clear bilaterally to auscultation without wheezes, rales, or rhonchi.  Heart: RRR with S1 S2. No murmurs, rubs, or gallops appreciated. Abdomen: Soft, non-tender, non-distended with normoactive bowel sounds. No hepatomegaly. No rebound/guarding. No obvious abdominal masses. Right groin with no hematoma and no  bruit Extremities: No edema.  Distal pedal pulses are 2+ and equal bilaterally. Neuro: Alert and oriented X 3. Moves all extremities spontaneously.  Labs: Recent Labs  [facility] 08/17/11 0640 08/16/11 0330   WBC 4.2 3.8*   HGB 13.4 12.0*   HCT 39.1 36.9*   MCV 91.8 92.5   PLT 89* 77*    Lab 08/17/11 0640 08/16/11 1627 08/16/11 0330 08/15/11 0458  NA 138 -- 135 139  K 4.1 -- 3.4* 3.8  CL 104 -- 102 104  CO2 27 -- 24 --  BUN 17 -- 19 15  CREATININE 0.81 -- 0.90 0.90  CALCIUM 8.8 -- 8.7 --  PROT -- -- -- --  BILITOT -- 0.7 -- --  ALKPHOS -- -- -- --  ALT -- -- -- --  AST -- -- -- --  AMYLASE -- -- -- --  LIPASE -- -- -- --  GLUCOSE 132* -- 208* 191*   Radiology/Studies: Dg Chest 2 View  08/15/2011  *RADIOLOGY REPORT*  Clinical Data: Severe chest pain, cough.  CHEST - 2 VIEW  Comparison: 08/28/2004  Findings: Heart size upper normal limits to mildly enlarged.  Mild tortuosity to the thoracic aorta. Bilateral nodular densities projecting over the lower lungs presumably represent nipple shadows.  Mild hyperinflation and chronic interstitial prominence without focal consolidation.  No pleural effusion or pneumothorax. Multilevel degenerative changes.  No acute osseous abnormality.  IMPRESSION: Mild cardiomegaly.  Stable hyperinflation and interstitial markings.  No focal consolidation.  Original Report Authenticated By: Waneta Martins, M.D.   Ct Angio Chest W/cm &/or Wo Cm  08/15/2011  *RADIOLOGY REPORT*  Clinical Data:  Chest pain and shortness of breath.  CT ANGIOGRAPHY CHEST WITH CONTRAST  Technique:  Multidetector CT imaging of the chest was performed using the standard protocol during bolus administration of intravenous contrast.  Multiplanar CT image reconstructions including MIPs were obtained to evaluate the vascular anatomy.  Contrast: OMNIPAQUE IOHEXOL 350 MG/ML IV SOLN  Comparison:  Chest x-ray 08/15/2011.  Findings:  Mild respiratory motion degrades images through  the lung bases.  Overall study diagnostic.  Good opacification of the central pulmonary vasculature.  No evidence for central pulmonary embolic disease. A tiny peripheral embolus could be obscured but is unlikely to be clinically significant.  Cardiomegaly.  Advanced coronary artery calcification most notable at the origin of the left main coronary artery and left anterior descending.  No pleural or pericardial effusion.  No hilar or mediastinal adenopathy.  Mild interstitial prominence may represent chronic lung disease but there are no areas of lobar consolidation or significant atelectasis.  Multilevel spondylosis.  No acute findings in the upper abdomen.  Previous cholecystectomy.  Right renal cyst incompletely evaluated.  Review of the MIP images confirms the above findings.  IMPRESSION: No central pulmonary embolus is observed.  Cardiomegaly with advanced coronary calcification.  Original Report Authenticated By: Elsie Stain, M.D.    EKG: Sinus tachycardia, 104 bpm, possible posterior infarct  ASSESSMENT AND PLAN:   1. Chest pain- Cath results noted; for CABG, timing per CVTS. Continue ASA, metoprolol and statin.   2. HTN- Continue present meds.  3. Thrombocytopenia Heme consult noted. May need BM biopsy at some point but plt count does not appear prohibitive for CABG 4. Bronchitis - continue antibiotics  Olga Millers, MD, [facility] 08/17/2011 8:30 AM

## 2011-08-17 NOTE — Progress Notes (Signed)
2 Days Post-Op Procedure(s) (LRB): LEFT HEART CATHETERIZATION WITH CORONARY ANGIOGRAM (N/A) Subjective: No c/o "when am I getting my surgery?"  Objective: Vital signs in last 24 hours: Temp:  [97.5 F (36.4 C)-99.6 F (37.6 C)] 98.7 F (37.1 C) (12/23 0506) Pulse Rate:  [63-87] 63  (12/23 0506) Cardiac Rhythm:  [-] Normal sinus rhythm;Heart block (12/23 0844) Resp:  [18-20] 18  (12/23 0506) BP: (89-115)/(50-67) 103/55 mmHg (12/23 0506) SpO2:  [91 %-98 %] 93 % (12/23 0506) Weight:  [99.5 kg (219 lb 5.7 oz)] 219 lb 5.7 oz (99.5 kg) (12/23 0506)  Hemodynamic parameters for last 24 hours:    Intake/Output from previous day: 12/22 0701 - 12/23 0700 In: 696 [P.O.:540; I.V.:156] Out: 870 [Urine:870] Intake/Output this shift: Total I/O In: 240 [P.O.:240] Out: -   unchanged  Lab Results:  [facility] 08/17/11 0640 08/16/11 0330  WBC 4.2 3.8*  HGB 13.4 12.0*  HCT 39.1 36.9*  PLT 89* 77*   BMET:  Basename 08/17/11 0640 08/16/11 0330  NA 138 135  K 4.1 3.4*  CL 104 102  CO2 27 24  GLUCOSE 132* 208*  BUN 17 19  CREATININE 0.81 0.90  CALCIUM 8.8 8.7    PT/INR: No results found for this basename: LABPROT,INR in the last 72 hours ABG    Component Value Date/Time   TCO2 24 08/15/2011 0458   CBG (last 3)   Basename 08/17/11 0603 08/16/11 2138 08/16/11 1625  GLUCAP 131* 127* 137*    Assessment/Plan: S/P Procedure(s) (LRB): LEFT HEART CATHETERIZATION WITH CORONARY ANGIOGRAM (N/A) CAD- needs CABG Thrombocytopenia w/u in progress- for bx tomorrow, HIT screen pending CABG after plt w/u complete I will be away next week, my partners will follow up in my absence   LOS: 2 days    Kristan Brummitt C 08/17/2011

## 2011-08-18 ENCOUNTER — Encounter (HOSPITAL_COMMUNITY)
Admission: EM | Disposition: A | Discharge status: Home / Self Care | Payer: Self-pay | Source: Home / Self Care | Attending: Thoracic Surgery (Cardiothoracic Vascular Surgery)

## 2011-08-18 ENCOUNTER — Inpatient Hospital Stay (HOSPITAL_COMMUNITY): Payer: Medicare Other

## 2011-08-18 DIAGNOSIS — Z01810 Encounter for preprocedural cardiovascular examination: Secondary | ICD-10-CM

## 2011-08-18 DIAGNOSIS — I2510 Atherosclerotic heart disease of native coronary artery without angina pectoris: Principal | ICD-10-CM

## 2011-08-18 LAB — BASIC METABOLIC PANEL
BUN: 15 mg/dL (ref 6–23)
CO2: 30 mEq/L (ref 19–32)
Chloride: 102 mEq/L (ref 96–112)
Creatinine, Ser: 0.88 mg/dL (ref 0.50–1.35)
Potassium: 3.9 mEq/L (ref 3.5–5.1)

## 2011-08-18 LAB — TYPE AND SCREEN
ABO/RH(D): A POS
Antibody Screen: NEGATIVE

## 2011-08-18 LAB — CBC
HCT: 39.8 % (ref 39.0–52.0)
Hemoglobin: 13.7 g/dL (ref 13.0–17.0)
MCHC: 34.4 g/dL (ref 30.0–36.0)
MCV: 91.7 fL (ref 78.0–100.0)
RDW: 14.2 % (ref 11.5–15.5)

## 2011-08-18 LAB — GLUCOSE, CAPILLARY
Glucose-Capillary: 138 mg/dL — ABNORMAL HIGH (ref 70–99)
Glucose-Capillary: 186 mg/dL — ABNORMAL HIGH (ref 70–99)
Glucose-Capillary: 143 mg/dL — ABNORMAL HIGH (ref 70–99)
Glucose-Capillary: 163 mg/dL — ABNORMAL HIGH (ref 70–99)

## 2011-08-18 LAB — POCT ACTIVATED CLOTTING TIME: Activated Clotting Time: 331 seconds

## 2011-08-18 LAB — ABO/RH: ABO/RH(D): A POS

## 2011-08-18 LAB — CULTURE, RESPIRATORY W GRAM STAIN: Culture: NORMAL

## 2011-08-18 SURGERY — CORONARY ARTERY BYPASS GRAFTING (CABG)
Anesthesia: General | Site: Chest

## 2011-08-18 MED ORDER — CHLORHEXIDINE GLUCONATE 4 % EX LIQD: 60.0000 mL | Freq: Once | CUTANEOUS | Status: DC

## 2011-08-18 MED ORDER — CHLORHEXIDINE GLUCONATE 4 % EX LIQD
60.0000 mL | Freq: Once | CUTANEOUS | Status: DC
  Filled 2011-08-18: qty 60

## 2011-08-18 MED ORDER — CHLORHEXIDINE GLUCONATE 4 % EX LIQD
60.0000 mL | Freq: Once | CUTANEOUS | Status: AC
  Administered 2011-08-18: 4 via TOPICAL
  Filled 2011-08-18: qty 60

## 2011-08-18 MED ORDER — METOPROLOL TARTRATE 12.5 MG HALF TABLET
12.5000 mg | ORAL_TABLET | Freq: Once | ORAL | Status: DC
  Filled 2011-08-18: qty 1

## 2011-08-18 MED ORDER — BISACODYL 5 MG PO TBEC
5.0000 mg | DELAYED_RELEASE_TABLET | Freq: Once | ORAL | Status: AC
  Administered 2011-08-18: 5 mg via ORAL
  Filled 2011-08-18: qty 1

## 2011-08-18 MED ORDER — TEMAZEPAM 15 MG PO CAPS: 15.0000 mg | ORAL_CAPSULE | Freq: Once | ORAL | Status: AC | PRN

## 2011-08-18 NOTE — H&P (Signed)
[name] is an 77 y.o. male.   Chief Complaint: thrombocytopenia HPI: Patient with history of thrombocytopenia ; request received from oncology  for CT guided bone marrow biopsy.  Past Medical History  Diagnosis Date  . Dizziness   . Fatigue     chronic  . HTN (hypertension)   . Vertigo   . AAA (abdominal aortic aneurysm)     Surgery Dr Arbie Cookey 2000. /  Ultrasound October, 2012, no significant abnormality, technically difficult  . Dyslipidemia     Triglycerides elevated  . Chest pain     October , 2012, Nuclear, no ischemia  . Elevated glucose   . Headache     October, 2012  . Ejection fraction     EF normal, nuclear, October, 2012  . Prostate cancer     Dr.Wrenn; S/P radiation  . Pneumonia 1940's  . GERD (gastroesophageal reflux disease)   . Arthritis   . CVA (cerebral vascular accident)     Old left frontal infarct by MRI 2008    Past Surgical History  Procedure Date  . Abdominal aortic aneurysm repair ~ 2000  . Femoral artery aneurysm repair ~ 2000  . Cholecystectomy   . Knee surgery 2009    left; repair  . Inguinal hernia repair     X3?    Family History  Problem Relation Age of Onset  . Heart attack Mother   . Heart attack Father   . Heart attack Brother   . Prostate cancer Brother   . Colon cancer Brother    Social History:  reports that he quit smoking about 12 years ago. He has quit using smokeless tobacco. His smokeless tobacco use included Chew. He reports that he drinks alcohol. He reports that he does not use illicit drugs.  Allergies:  Allergies  Allergen Reactions  . Toradol Rash    Medications Prior to Admission  Medication Dose Route Frequency Provider Last Rate Last Dose  . 0.9 %  sodium chloride infusion   Intravenous Continuous Verne Carrow, MD      . 0.9 %  sodium chloride infusion   Intravenous Continuous Luis Abed, MD 10 mL/hr at 08/16/11 0700 10 mL/hr at 08/16/11 0700  . albuterol (PROVENTIL) (5 MG/ML) 0.5%  nebulizer solution 2.5 mg  2.5 mg Nebulization Q6H PRN Zacarias Pontes   2.5 mg at 08/16/11 0007  . albuterol (PROVENTIL) (5 MG/ML) 0.5% nebulizer solution 5 mg  5 mg Nebulization Once Angus Seller, PA   5 mg at 08/15/11 4098  . aspirin EC tablet 81 mg  81 mg Oral Daily Roger Arguello   81 mg at 08/18/11 1032  . azithromycin (ZITHROMAX) tablet 500 mg  500 mg Oral Daily Roger Arguello   500 mg at 08/15/11 1055   Followed by  . azithromycin (ZITHROMAX) tablet 250 mg  250 mg Oral Daily Roger Arguello   250 mg at 08/18/11 1032  . diazepam (VALIUM) tablet 5 mg  5 mg Oral On Call Roger Arguello   5 mg at 08/15/11 1353  . fentaNYL (SUBLIMAZE) 0.05 MG/ML injection           . guaiFENesin (ROBITUSSIN) 100 MG/5ML solution 100 mg  5 mL Oral Q4H PRN Luis Abed, MD   100 mg at 08/18/11 0546  . heparin 1000 UNIT/ML injection           . heparin 2-0.9 UNIT/ML-% infusion           . heparin bolus via  infusion 4,000 Units  4,000 Units Intravenous Once Judie Bonus Hammons, PHARMD   4,000 Units at 08/15/11 1338  . insulin aspart (novoLOG) injection 0-15 Units  0-15 Units Subcutaneous TID WC Ok Anis, NP   2 Units at 08/18/11 [phone]  . insulin aspart (novoLOG) injection 0-5 Units  0-5 Units Subcutaneous QHS Ok Anis, NP      . iohexol (OMNIPAQUE) 350 MG/ML injection 100 mL  100 mL Intravenous Once PRN Medication Radiologist   100 mL at 08/15/11 0739  . ipratropium (ATROVENT) nebulizer solution 0.5 mg  0.5 mg Nebulization Once Angus Seller, PA   0.5 mg at 08/15/11 0508  . lidocaine (XYLOCAINE) 1 % injection           . metoprolol tartrate (LOPRESSOR) tablet 25 mg  25 mg Oral BID Roger Arguello   25 mg at 08/18/11 1032  . midazolam (VERSED) 2 MG/2ML injection           . multivitamin (PROSIGHT) tablet 1 tablet  1 tablet Oral Daily Elwin Sleight, PHARMD   1 tablet at 08/18/11 1032  . nitroGLYCERIN (NITROSTAT) SL tablet 0.4 mg  0.4 mg Sublingual Q5 min PRN Odella Aquas      .  nitroGLYCERIN (NTG ON-CALL) 0.2 mg/mL injection           . nitroGLYCERIN 0.2 mg/mL in dextrose 5 % infusion  3-30 mcg/min Intravenous Titrated Roger Arguello 3 mL/hr at 08/16/11 0700 10 mcg/min at 08/16/11 0700  . omega-3 acid ethyl esters (LOVAZA) capsule 2 g  2 g Oral Daily Elwin Sleight, PHARMD   2 g at 08/18/11 1032  . ondansetron (ZOFRAN) injection 4 mg  4 mg Intravenous Q6H PRN Roger Arguello   4 mg at 08/16/11 1742  . potassium chloride SA (K-DUR,KLOR-CON) CR tablet 40 mEq  40 mEq Oral Once Lewayne Bunting, MD   40 mEq at 08/16/11 0947  . rosuvastatin (CRESTOR) tablet 40 mg  40 mg Oral NOW Roger Arguello   40 mg at 08/15/11 1241   Followed by  . rosuvastatin (CRESTOR) tablet 40 mg  40 mg Oral q1800 Roger Arguello   40 mg at 08/17/11 1632  . DISCONTD: 0.9 %  sodium chloride infusion  250 mL Intravenous PRN Odella Aquas      . DISCONTD: 0.9 %  sodium chloride infusion  250 mL Intravenous PRN Odella Aquas      . DISCONTD: 0.9 %  sodium chloride infusion   Intravenous Continuous Roger Arguello 100 mL/hr at 08/15/11 1139    . DISCONTD: acetaminophen (TYLENOL) tablet 650 mg  650 mg Oral Q4H PRN Odella Aquas      . DISCONTD: acetaminophen (TYLENOL) tablet 650 mg  650 mg Oral Q4H PRN Verne Carrow, MD   650 mg at 08/16/11 0717  . DISCONTD: adenosine (ADENOCARD) 12 MG/4ML injection 48 mg  16 mL Intravenous Once Earle J Stramoski, PHARMD      . DISCONTD: darifenacin (ENABLEX) 24 hr tablet 7.5 mg  7.5 mg Oral Daily Roger Arguello   7.5 mg at 08/15/11 1241  . DISCONTD: fish oil-omega-3 fatty acids capsule 2 g  2 g Oral Daily Roger Arguello      . DISCONTD: guaiFENesin (MUCINEX) 12 hr tablet 1,200 mg  1,200 mg Oral BID Roger Arguello   1,200 mg at 08/15/11 1241  . DISCONTD: guaiFENesin-dextromethorphan (ROBITUSSIN DM) 100-10 MG/5ML syrup 5 mL  5 mL Oral Q4H PRN Odella Aquas      . DISCONTD:  heparin ADULT infusion 100 units/ml (25000 units/250 ml)  1,300 Units/hr Intravenous Continuous  Judie Bonus Hammons, PHARMD 13 mL/hr at 08/15/11 1332 1,300 Units/hr at 08/15/11 1332  . DISCONTD: meclizine (ANTIVERT) tablet 31.25 mg  31.25 mg Oral TID PRN Odella Aquas      . DISCONTD: multivitamin-lutein (OCUVITE-LUTEIN) capsule 1 capsule  1 capsule Oral Daily Roger Arguello      . DISCONTD: sodium chloride 0.9 % injection 3 mL  3 mL Intravenous Q12H Roger Arguello   3 mL at 08/15/11 1245  . DISCONTD: sodium chloride 0.9 % injection 3 mL  3 mL Intravenous PRN Odella Aquas      . DISCONTD: sodium chloride 0.9 % injection 3 mL  3 mL Intravenous Q12H Roger Arguello      . DISCONTD: sodium chloride 0.9 % injection 3 mL  3 mL Intravenous PRN Roger Arguello       Medications Prior to Admission  Medication Sig Dispense Refill  . aspirin 325 MG tablet Take 325 mg by mouth daily.        . fish oil-omega-3 fatty acids 1000 MG capsule Take 2 g by mouth daily.        [name] ibuprofen (ADVIL,MOTRIN) 600 MG tablet Take 1 tablet by mouth every 6 (six) hours as needed. For pain      . meclizine (ANTIVERT) 32 MG tablet Take 32 mg by mouth 3 (three) times daily as needed. For dizziness      . multivitamin-lutein (OCUVITE-LUTEIN) CAPS Take 1 capsule by mouth daily.        . NON FORMULARY Flex-mint  -- 2 tabs daily       . RABEprazole (ACIPHEX) 20 MG tablet Take 20 mg by mouth daily.        . solifenacin (VESICARE) 5 MG tablet Take 5 mg by mouth daily.         Results for orders placed during the hospital encounter of 08/15/11 (from the past 48 hour(s))  GLUCOSE, CAPILLARY     Status: Abnormal   Collection Time   08/16/11  4:25 PM      Component Value Range Comment   Glucose-Capillary 137 (*) 70 - 99 (mg/dL)   LACTATE DEHYDROGENASE     Status: Abnormal   Collection Time   08/16/11  4:27 PM      Component Value Range Comment   LD 258 (*) 94 - 250 (U/L) HEMOLYSIS AT THIS LEVEL MAY AFFECT RESULT  BILIRUBIN, FRACTIONATED(TOT/DIR/INDIR)     Status: Normal   Collection Time   08/16/11  4:27 PM       Component Value Range Comment   Total Bilirubin 0.7  0.3 - 1.2 (mg/dL)    Bilirubin, Direct 0.2  0.0 - 0.3 (mg/dL) HEMOLYSIS AT THIS LEVEL MAY AFFECT RESULT   Indirect Bilirubin 0.5  0.3 - 0.9 (mg/dL)   GLUCOSE, CAPILLARY     Status: Abnormal   Collection Time   08/16/11  9:38 PM      Component Value Range Comment   Glucose-Capillary 127 (*) 70 - 99 (mg/dL)   GLUCOSE, CAPILLARY     Status: Abnormal   Collection Time   08/17/11  6:03 AM      Component Value Range Comment   Glucose-Capillary 131 (*) 70 - 99 (mg/dL)   BASIC METABOLIC PANEL     Status: Abnormal   Collection Time   08/17/11  6:40 AM      Component Value Range Comment   Sodium 138  135 -  145 (mEq/L)    Potassium 4.1  3.5 - 5.1 (mEq/L)    Chloride 104  96 - 112 (mEq/L)    CO2 27  19 - 32 (mEq/L)    Glucose, Bld 132 (*) 70 - 99 (mg/dL)    BUN 17  6 - 23 (mg/dL)    Creatinine, Ser 1.61  0.50 - 1.35 (mg/dL)    Calcium 8.8  8.4 - 10.5 (mg/dL)    GFR calc non Af Amer 84 (*) >90 (mL/min)    GFR calc Af Amer >90  >90 (mL/min)   CBC     Status: Abnormal   Collection Time   08/17/11  6:40 AM      Component Value Range Comment   WBC 4.2  4.0 - 10.5 (K/uL)    RBC 4.26  4.22 - 5.81 (MIL/uL)    Hemoglobin 13.4  13.0 - 17.0 (g/dL)    HCT 09.6  04.5 - 40.9 (%)    MCV 91.8  78.0 - 100.0 (fL)    MCH 31.5  26.0 - 34.0 (pg)    MCHC 34.3  30.0 - 36.0 (g/dL)    RDW 81.1  91.4 - 78.2 (%)    Platelets 89 (*) 150 - 400 (K/uL) CONSISTENT WITH PREVIOUS RESULT  GLUCOSE, CAPILLARY     Status: Abnormal   Collection Time   08/17/11 11:27 AM      Component Value Range Comment   Glucose-Capillary 161 (*) 70 - 99 (mg/dL)   GLUCOSE, CAPILLARY     Status: Abnormal   Collection Time   08/17/11  4:07 PM      Component Value Range Comment   Glucose-Capillary 100 (*) 70 - 99 (mg/dL)   GLUCOSE, CAPILLARY     Status: Abnormal   Collection Time   08/17/11  9:26 PM      Component Value Range Comment   Glucose-Capillary 172 (*) 70 - 99 (mg/dL)      Comment 1 Notify RN     BASIC METABOLIC PANEL     Status: Abnormal   Collection Time   08/18/11  6:12 AM      Component Value Range Comment   Sodium 138  135 - 145 (mEq/L)    Potassium 3.9  3.5 - 5.1 (mEq/L)    Chloride 102  96 - 112 (mEq/L)    CO2 30  19 - 32 (mEq/L)    Glucose, Bld 126 (*) 70 - 99 (mg/dL)    BUN 15  6 - 23 (mg/dL)    Creatinine, Ser 9.56  0.50 - 1.35 (mg/dL)    Calcium 9.3  8.4 - 10.5 (mg/dL)    GFR calc non Af Amer 81 (*) >90 (mL/min)    GFR calc Af Amer >90  >90 (mL/min)   CBC     Status: Abnormal   Collection Time   08/18/11  6:12 AM      Component Value Range Comment   WBC 3.9 (*) 4.0 - 10.5 (K/uL)    RBC 4.34  4.22 - 5.81 (MIL/uL)    Hemoglobin 13.7  13.0 - 17.0 (g/dL)    HCT 21.3  08.6 - 57.8 (%)    MCV 91.7  78.0 - 100.0 (fL)    MCH 31.6  26.0 - 34.0 (pg)    MCHC 34.4  30.0 - 36.0 (g/dL)    RDW 46.9  62.9 - 52.8 (%)    Platelets 98 (*) 150 - 400 (K/uL) CONSISTENT WITH PREVIOUS RESULT  GLUCOSE, CAPILLARY  Status: Abnormal   Collection Time   08/18/11  6:37 AM      Component Value Range Comment   Glucose-Capillary 138 (*) 70 - 99 (mg/dL)    Comment 1 Notify RN      No results found.  ROS  Blood pressure 108/58, pulse 79, temperature 98.3 F (36.8 C), temperature source Oral, resp. rate 18, height 5\' 11"  (1.803 m), weight 218 lb 7.6 oz (99.1 kg), SpO2 92.00%. Physical Exam patient awake/ alert; chest- CTA bilat; heart -RRR with occ ectopy  Assessment/Plan Patient with thrombocytopenia; CT guided bone marrow biopsy planned for 12/26. Details/risks of procedure discussed with patient /family with their understanding and consent. Procedure not performed today secondary to patient eating breakfast(IV conscious sedation used) and histology techs unavailable after noon. Tareq Dwan,D KEVIN 08/18/2011, 11:18 AM

## 2011-08-18 NOTE — Progress Notes (Signed)
*  PRELIMINARY RESULTS*  Pre CABG Dopplers have been performed.    Carotid = Bilaterally no significant ICA stenosis with antegrade vertebral flow.  ABI =  RIGHT    LEFT    PRESSURE WAVEFORM  PRESSURE WAVEFORM  BRACHIAL 111 Tri BRACHIAL 122 Tri  DP X X DP X X  PT 153 Tri PT 89 Mono  AT 128 Tri AT 92 Mono  PER X X PER X X  GREAT TOE X X GREAT TOE X X    RIGHT LEFT  ABI 1.25 = Normal 0.75 = Borderline Moderate   Palmar Arch Evaluation = Right:  Doppler obliterates with radial compression and obliterates with ulnar compression. Left:  Doppler decreases >50% with radial compression and obliterates with ulnar compression.  Bilateral: Radial and ulnar waveforms are triphasic.     Farrel Demark RDMS    08/18/2011, 3:10 PM

## 2011-08-18 NOTE — Progress Notes (Signed)
SUBJECTIVE: No chest pain or SOB. No events.   BP 108/58  Pulse 79  Temp(Src) 98.3 F (36.8 C) (Oral)  Resp 18  Ht 5\' 11"  (1.803 m)  Wt 218 lb 7.6 oz (99.1 kg)  BMI 30.47 kg/m2  SpO2 92%  Intake/Output Summary (Last 24 hours) at 08/18/11 4782 Last data filed at 08/18/11 9562  Gross per 24 hour  Intake 928.22 ml  Output   1850 ml  Net -921.78 ml    PHYSICAL EXAM General: Well developed, well nourished, in no acute distress. Alert and oriented x 3.  Psych:  Good affect, responds appropriately Neck: No JVD. No masses noted.  Lungs: Clear bilaterally with no wheezes or rhonci noted.  Heart: RRR with no murmurs noted. Abdomen: Bowel sounds are present. Soft, non-tender.  Extremities: No lower extremity edema.   LABS: Basic Metabolic Panel:  Basename 08/17/11 0640 08/16/11 0330  NA 138 135  K 4.1 3.4*  CL 104 102  CO2 27 24  GLUCOSE 132* 208*  BUN 17 19  CREATININE 0.81 0.90  CALCIUM 8.8 8.7  MG -- --  PHOS -- --   CBC:  Basename 08/17/11 0640 08/16/11 0330  WBC 4.2 3.8*  NEUTROABS -- --  HGB 13.4 12.0*  HCT 39.1 36.9*  MCV 91.8 92.5  PLT 89* 77*   Cardiac Enzymes:  Basename 08/15/11 1842 08/15/11 1205  CKTOTAL 163 180  CKMB 1.6 1.6  CKMBINDEX -- --  TROPONINI <0.30 <0.30   Fasting Lipid Panel:  Basename 08/16/11 0330  CHOL 105  HDL 26*  LDLCALC 58  TRIG 130  CHOLHDL 4.0  LDLDIRECT --    Current Meds:    . aspirin EC  81 mg Oral Daily  . azithromycin  250 mg Oral Daily  . insulin aspart  0-15 Units Subcutaneous TID WC  . insulin aspart  0-5 Units Subcutaneous QHS  . metoprolol tartrate  25 mg Oral BID  . multivitamin  1 tablet Oral Daily  . omega-3 acid ethyl esters  2 g Oral Daily  . rosuvastatin  40 mg Oral q1800     ASSESSMENT AND PLAN:  1. Chest pain- Cath on Friday 08/15/11 with severe proximal LAD disease, probable left main stenosis. PCI is not a good option given his anatomy with proximal LAD stenosis followed by an aneurysmal  segment. He has been evaluated by Dr Dorris Fetch from CT surgery  for CABG. Timing per CVTS. He has had no chest pain. Continue ASA, metoprolol and statin.   2. HTN- Continue present meds.   3. Thrombocytopenia Heme consult noted. May need BM biopsy at some point but plt count does not appear prohibitive for CABG. Follow up on platelets today.   4. Bronchitis - Stable. Continue antibiotics    Nelva Hauk  12/24/20126:49 AM

## 2011-08-18 NOTE — Progress Notes (Signed)
Plts have improved to 98k. Smear and other parameters are wnl. I suspect low grade ITP. This current plt count should not preclude him from undergoing bypass surgery. We have scheduled a bone marrow to be performed by IR.

## 2011-08-18 NOTE — Progress Notes (Signed)
   CARDIOTHORACIC SURGERY PROGRESS NOTE  3 Days Post-Op  S/P Procedure(s) (LRB): LEFT HEART CATHETERIZATION WITH CORONARY ANGIOGRAM (N/A)  Subjective: No chest pain.  No SOB.  Objective: Vital signs in last 24 hours: Temp:  [98.2 F (36.8 C)-98.4 F (36.9 C)] 98.3 F (36.8 C) (12/24 0539) Pulse Rate:  [64-79] 79  (12/24 0539) Cardiac Rhythm:  [-] Heart block (12/24 0852) Resp:  [18] 18  (12/24 0539) BP: (108-122)/(58-62) 108/58 mmHg (12/24 0539) SpO2:  [92 %-97 %] 92 % (12/24 0539) Weight:  [99.1 kg (218 lb 7.6 oz)] 218 lb 7.6 oz (99.1 kg) (12/24 0539)  Physical Exam:  Rhythm:   sinus  Breath sounds: clear  Heart sounds:  RRR  Incisions:  n/a  Abdomen:  soft  Extremities:  warm   Intake/Output from previous day: 12/23 0701 - 12/24 0700 In: 988.2 [P.O.:720; I.V.:268.2] Out: 1850 [Urine:1850] Intake/Output this shift: Total I/O In: 120 [P.O.:120] Out: -   Lab Results:  [facility] 08/18/11 0612 08/17/11 0640  WBC 3.9* 4.2  HGB 13.7 13.4  HCT 39.8 39.1  PLT 98* 89*   BMET:  Basename 08/18/11 0612 08/17/11 0640  NA 138 138  K 3.9 4.1  CL 102 104  CO2 30 27  GLUCOSE 126* 132*  BUN 15 17  CREATININE 0.88 0.81  CALCIUM 9.3 8.8    CBG (last 3)   Basename 08/18/11 1110 08/18/11 0637 08/17/11 2126  GLUCAP 186* 138* 172*   PT/INR:  No results found for this basename: LABPROT,INR in the last 72 hours  CXR:  N/A  Assessment/Plan:  Left main disease with 3-vessel CAD, unstable angina.   Thrombocytopenia, mild, improved.  I have reviewed Mr Bunt' history, exam, labs and cath films.  I agree that CABG appears to be the best option.  The patient understands and accepts all potential associated risks of surgery including but not limited to risk of death, stroke, myocardial infarction, congestive heart failure, respiratory failure, renal failure, bleeding requiring blood transfusion and/or reexploration, arrhythmia, heart block or bradycardia requiring permanent  pacemaker, pneumonia, pleural effusion, wound infection, pulmonary embolus or other thromboembolic complication, chronic pain or other delayed complications including the possibility of late recurrence of symptomatic CAD and/or acute MI.  He understands the somewhat increased risk of bleeding and/or need for transfusion of blood products given the preexisting thrombocytopenia.  All questions answered.  We plan CABG on Wednesday 12/26.  Bone marrow biopsy will need to be delayed until postoperatively if it doesn't get done today.   [name] H 08/18/2011 12:15 PM

## 2011-08-19 DIAGNOSIS — I2510 Atherosclerotic heart disease of native coronary artery without angina pectoris: Secondary | ICD-10-CM

## 2011-08-19 LAB — CBC
MCH: 31.1 pg (ref 26.0–34.0)
MCHC: 34.5 g/dL (ref 30.0–36.0)
Platelets: 93 10*3/uL — ABNORMAL LOW (ref 150–400)
RDW: 13.8 % (ref 11.5–15.5)

## 2011-08-19 LAB — URINALYSIS, ROUTINE W REFLEX MICROSCOPIC
Bilirubin Urine: NEGATIVE
Glucose, UA: NEGATIVE mg/dL
Hgb urine dipstick: NEGATIVE
Protein, ur: NEGATIVE mg/dL

## 2011-08-19 LAB — BASIC METABOLIC PANEL
BUN: 16 mg/dL (ref 6–23)
Calcium: 9 mg/dL (ref 8.4–10.5)
Creatinine, Ser: 0.9 mg/dL (ref 0.50–1.35)
GFR calc Af Amer: >90 mL/min (ref >90)
GFR calc non Af Amer: 81 mL/min — ABNORMAL LOW (ref >90)
Glucose, Bld: 130 mg/dL — ABNORMAL HIGH (ref 70–99)
Potassium: 3.7 mEq/L (ref 3.5–5.1)

## 2011-08-19 LAB — GLUCOSE, CAPILLARY: Glucose-Capillary: 173 mg/dL — ABNORMAL HIGH (ref 70–99)

## 2011-08-19 LAB — SURGICAL PCR SCREEN
MRSA, PCR: NEGATIVE
Staphylococcus aureus: NEGATIVE

## 2011-08-19 MED ORDER — DOPAMINE-DEXTROSE 3.2-5 MG/ML-% IV SOLN
2.0000 ug/kg/min | INTRAVENOUS | Status: DC
  Filled 2011-08-19: qty 250

## 2011-08-19 MED ORDER — NITROGLYCERIN IN D5W 200-5 MCG/ML-% IV SOLN
2.0000 ug/min | INTRAVENOUS | Status: DC
  Filled 2011-08-19: qty 250

## 2011-08-19 MED ORDER — CHLORHEXIDINE GLUCONATE 4 % EX LIQD
60.0000 mL | Freq: Once | CUTANEOUS | Status: AC
  Administered 2011-08-19: 4 via TOPICAL
  Filled 2011-08-19: qty 60

## 2011-08-19 MED ORDER — SODIUM CHLORIDE 0.9 % IV SOLN
INTRAVENOUS | Status: DC
  Filled 2011-08-19: qty 1

## 2011-08-19 MED ORDER — POTASSIUM CHLORIDE 2 MEQ/ML IV SOLN
80.0000 meq | INTRAVENOUS | Status: DC
  Filled 2011-08-19: qty 40

## 2011-08-19 MED ORDER — SODIUM CHLORIDE 0.9 % IV SOLN
0.1000 ug/kg/h | INTRAVENOUS | Status: DC
  Filled 2011-08-19: qty 4

## 2011-08-19 MED ORDER — EPINEPHRINE HCL 1 MG/ML IJ SOLN
0.5000 ug/min | INTRAVENOUS | Status: DC
  Filled 2011-08-19: qty 4

## 2011-08-19 MED ORDER — SODIUM CHLORIDE 0.9 % IV SOLN
INTRAVENOUS | Status: DC
  Filled 2011-08-19: qty 40

## 2011-08-19 MED ORDER — PLASMA-LYTE 148 IV SOLN
INTRAVENOUS | Status: DC
  Filled 2011-08-19: qty 0.5

## 2011-08-19 MED ORDER — DEXTROSE 5 % IV SOLN
30.0000 ug/min | INTRAVENOUS | Status: DC
  Filled 2011-08-19: qty 2

## 2011-08-19 MED ORDER — MAGNESIUM SULFATE 50 % IJ SOLN
40.0000 meq | INTRAMUSCULAR | Status: DC
  Filled 2011-08-19: qty 10

## 2011-08-19 MED ORDER — DEXTROSE 5 % IV SOLN
1.5000 g | INTRAVENOUS | Status: DC
  Filled 2011-08-19: qty 1.5

## 2011-08-19 MED ORDER — VANCOMYCIN HCL 1000 MG IV SOLR
1500.0000 mg | INTRAVENOUS | Status: DC
  Filled 2011-08-19: qty 1500

## 2011-08-19 MED ORDER — CHLORHEXIDINE GLUCONATE 4 % EX LIQD
60.0000 mL | Freq: Once | CUTANEOUS | Status: DC
  Filled 2011-08-19: qty 60

## 2011-08-19 MED ORDER — DEXTROSE 5 % IV SOLN
750.0000 mg | INTRAVENOUS | Status: DC
  Filled 2011-08-19: qty 750

## 2011-08-19 NOTE — Progress Notes (Signed)
SUBJECTIVE: No chest pain or SOB. NO events overnight.   BP 117/73  Pulse 61  Temp(Src) 97.8 F (36.6 C) (Oral)  Resp 19  Ht 5\' 11"  (1.803 m)  Wt 217 lb 9.5 oz (98.7 kg)  BMI 30.35 kg/m2  SpO2 97%  Intake/Output Summary (Last 24 hours) at 08/19/11 0830 Last data filed at 08/19/11 1610  Gross per 24 hour  Intake    360 ml  Output    925 ml  Net   -565 ml    PHYSICAL EXAM General: Well developed, well nourished, in no acute distress. Alert and oriented x 3.  Psych:  Good affect, responds appropriately Neck: No JVD. No masses noted.  Lungs: rhonci bilaterally Heart: RRR with no murmurs noted. Abdomen: Bowel sounds are present. Soft, non-tender.  Extremities: No lower extremity edema.   LABS: Basic Metabolic Panel:  Basename 08/19/11 0510 08/18/11 0612  NA 138 138  K 3.7 3.9  CL 102 102  CO2 30 30  GLUCOSE 130* 126*  BUN 16 15  CREATININE 0.90 0.88  CALCIUM 9.0 9.3  MG -- --  PHOS -- --   CBC:  Basename 08/19/11 0510 08/18/11 0612  WBC 3.8* 3.9*  NEUTROABS -- --  HGB 12.8* 13.7  HCT 37.1* 39.8  MCV 90.3 91.7  PLT 93* 98*    Current Meds:    . aspirin EC  81 mg Oral Daily  . azithromycin  250 mg Oral Daily  . bisacodyl  5 mg Oral Once  . chlorhexidine  60 mL Topical Once  . chlorhexidine  60 mL Topical Once  . insulin aspart  0-15 Units Subcutaneous TID WC  . insulin aspart  0-5 Units Subcutaneous QHS  . metoprolol tartrate  12.5 mg Oral Once  . metoprolol tartrate  25 mg Oral BID  . multivitamin  1 tablet Oral Daily  . rosuvastatin  40 mg Oral q1800  . DISCONTD: chlorhexidine  60 mL Topical Once  . DISCONTD: omega-3 acid ethyl esters  2 g Oral Daily     ASSESSMENT AND PLAN:   1. CAD: Pt admitted with chest pain c/w unstable angina. Cath on Friday 08/15/11 with severe proximal LAD disease, probable left main stenosis. PCI is not a good option given his anatomy with proximal LAD stenosis followed by an aneurysmal segment involving two diagonal  branches. He has been evaluated by CT surgery for CABG, seen by Dr. Cornelius Moras yesterday. Plans for CABG on 08/20/11. He has had no chest pain over the last 72 hours. Continue ASA, metoprolol and statin. NPO at midnight.   2. HTN- Continue present meds   3. Thrombocytopenia: Platelets stable. Heme consult noted. Likely low grade ITP. OK to proceed with revascularization. Will need BM biopsy after his CABG.    4. Bronchitis - Stable. Continue antibiotics.    [name]  12/25/20128:30 AM

## 2011-08-19 NOTE — Progress Notes (Signed)
   CARDIOTHORACIC SURGERY PROGRESS NOTE  4 Days Post-Op  S/P Procedure(s) (LRB): LEFT HEART CATHETERIZATION WITH CORONARY ANGIOGRAM (N/A)  Subjective: No chest pain. No SOB.  Feels fine.  Objective: Vital signs in last 24 hours: Temp:  [97.7 F (36.5 C)-98.2 F (36.8 C)] 97.8 F (36.6 C) (12/25 0557) Pulse Rate:  [59-61] 61  (12/25 0557) Cardiac Rhythm:  [-] Heart block (12/25 0800) Resp:  [18-20] 19  (12/25 0557) BP: (113-122)/(53-73) 117/73 mmHg (12/25 0557) SpO2:  [95 %-97 %] 97 % (12/25 0557) Weight:  [98.7 kg (217 lb 9.5 oz)] 217 lb 9.5 oz (98.7 kg) (12/25 0557)  Physical Exam:  Rhythm:   sinus  Breath sounds: clear  Heart sounds:  RRR  Incisions:  n/a  Abdomen:  soft  Extremities:  warm   Intake/Output from previous day: 12/24 0701 - 12/25 0700 In: 360 [P.O.:360] Out: 925 [Urine:925] Intake/Output this shift: Total I/O In: 240 [P.O.:240] Out: -   Lab Results:  [facility] 08/19/11 0510 08/18/11 0612  WBC 3.8* 3.9*  HGB 12.8* 13.7  HCT 37.1* 39.8  PLT 93* 98*   BMET:  Basename 08/19/11 0510 08/18/11 0612  NA 138 138  K 3.7 3.9  CL 102 102  CO2 30 30  GLUCOSE 130* 126*  BUN 16 15  CREATININE 0.90 0.88  CALCIUM 9.0 9.3    CBG (last 3)   Basename 08/19/11 1018 08/19/11 0559 08/18/11 2112  GLUCAP 139* 149* 163*   PT/INR:  No results found for this basename: LABPROT,INR in the last 72 hours  CXR:  clear  Assessment/Plan: S/P Procedure(s) (LRB): LEFT HEART CATHETERIZATION WITH CORONARY ANGIOGRAM (N/A)  For CABG in am.  All questions answered.  [name] 08/19/2011 11:08 AM

## 2011-08-20 ENCOUNTER — Inpatient Hospital Stay (HOSPITAL_COMMUNITY): Payer: Medicare Other

## 2011-08-20 ENCOUNTER — Encounter (HOSPITAL_COMMUNITY): Payer: Self-pay | Admitting: Radiology

## 2011-08-20 ENCOUNTER — Other Ambulatory Visit: Payer: Self-pay | Admitting: Interventional Radiology

## 2011-08-20 ENCOUNTER — Other Ambulatory Visit: Payer: Self-pay

## 2011-08-20 DIAGNOSIS — I2510 Atherosclerotic heart disease of native coronary artery without angina pectoris: Secondary | ICD-10-CM

## 2011-08-20 LAB — BONE MARROW EXAM: Bone Marrow Exam: 967

## 2011-08-20 LAB — GLUCOSE, CAPILLARY
Glucose-Capillary: 151 mg/dL — ABNORMAL HIGH (ref 70–99)
Glucose-Capillary: 138 mg/dL — ABNORMAL HIGH (ref 70–99)

## 2011-08-20 LAB — CBC
Hemoglobin: 13.4 g/dL (ref 13.0–17.0)
MCH: 31 pg (ref 26.0–34.0)
MCV: 90 fL (ref 78.0–100.0)
Platelets: 105 10*3/uL — ABNORMAL LOW (ref 150–400)
RBC: 4.32 MIL/uL (ref 4.22–5.81)
WBC: 4.7 10*3/uL (ref 4.0–10.5)

## 2011-08-20 MED ORDER — EPINEPHRINE HCL 1 MG/ML IJ SOLN
0.5000 ug/min | INTRAVENOUS | Status: DC
  Filled 2011-08-20: qty 4

## 2011-08-20 MED ORDER — VANCOMYCIN HCL 1000 MG IV SOLR
1500.0000 mg | INTRAVENOUS | Status: AC
  Administered 2011-08-21: 1500 mg via INTRAVENOUS
  Filled 2011-08-20: qty 1500

## 2011-08-20 MED ORDER — CEFUROXIME SODIUM 1.5 G IJ SOLR
1.5000 g | INTRAMUSCULAR | Status: AC
  Administered 2011-08-21: 1.5 g via INTRAVENOUS
  Filled 2011-08-20: qty 1.5

## 2011-08-20 MED ORDER — DEXTROSE 5 % IV SOLN
750.0000 mg | INTRAVENOUS | Status: DC
  Filled 2011-08-20: qty 750

## 2011-08-20 MED ORDER — NITROGLYCERIN IN D5W 200-5 MCG/ML-% IV SOLN
2.0000 ug/min | INTRAVENOUS | Status: DC
  Filled 2011-08-20: qty 250

## 2011-08-20 MED ORDER — PLASMA-LYTE 148 IV SOLN
INTRAVENOUS | Status: AC
  Administered 2011-08-21: 10:00:00
  Filled 2011-08-20: qty 0.5

## 2011-08-20 MED ORDER — DEXTROSE 5 % IV SOLN
30.0000 ug/min | INTRAVENOUS | Status: DC
  Administered 2011-08-21: 5 ug/min via INTRAVENOUS
  Filled 2011-08-20: qty 2

## 2011-08-20 MED ORDER — FENTANYL CITRATE 0.05 MG/ML IJ SOLN
INTRAMUSCULAR | Status: AC | PRN
  Administered 2011-08-20 (×2): 50 ug via INTRAVENOUS
  Administered 2011-08-20: 25 ug via INTRAVENOUS

## 2011-08-20 MED ORDER — MIDAZOLAM HCL 2 MG/2ML IJ SOLN
INTRAMUSCULAR | Status: AC
  Filled 2011-08-20: qty 4

## 2011-08-20 MED ORDER — DOPAMINE-DEXTROSE 3.2-5 MG/ML-% IV SOLN
2.0000 ug/kg/min | INTRAVENOUS | Status: DC
  Filled 2011-08-20: qty 250

## 2011-08-20 MED ORDER — SODIUM CHLORIDE 0.9 % IV SOLN
0.1000 ug/kg/h | INTRAVENOUS | Status: AC
  Administered 2011-08-21: .3 ug/kg/h via INTRAVENOUS
  Filled 2011-08-20: qty 4

## 2011-08-20 MED ORDER — POTASSIUM CHLORIDE 2 MEQ/ML IV SOLN
80.0000 meq | INTRAVENOUS | Status: DC
  Filled 2011-08-20: qty 40

## 2011-08-20 MED ORDER — FENTANYL CITRATE 0.05 MG/ML IJ SOLN
INTRAMUSCULAR | Status: AC
  Filled 2011-08-20: qty 4

## 2011-08-20 MED ORDER — MAGNESIUM SULFATE 50 % IJ SOLN
40.0000 meq | INTRAMUSCULAR | Status: DC
  Filled 2011-08-20: qty 10

## 2011-08-20 MED ORDER — CHLORHEXIDINE GLUCONATE 4 % EX LIQD
60.0000 mL | Freq: Once | CUTANEOUS | Status: DC
  Filled 2011-08-20: qty 60

## 2011-08-20 MED ORDER — SODIUM CHLORIDE 0.9 % IV SOLN
INTRAVENOUS | Status: DC
  Filled 2011-08-20: qty 1

## 2011-08-20 MED ORDER — METOPROLOL TARTRATE 12.5 MG HALF TABLET
12.5000 mg | ORAL_TABLET | Freq: Once | ORAL | Status: AC
  Administered 2011-08-21: 12.5 mg via ORAL
  Filled 2011-08-20: qty 1

## 2011-08-20 MED ORDER — AMINOCAPROIC ACID 250 MG/ML IV SOLN
INTRAVENOUS | Status: DC
  Filled 2011-08-20: qty 40

## 2011-08-20 MED ORDER — MIDAZOLAM HCL 5 MG/5ML IJ SOLN
INTRAMUSCULAR | Status: AC | PRN
  Administered 2011-08-20 (×3): 1 mg via INTRAVENOUS

## 2011-08-20 NOTE — Progress Notes (Signed)
Phone call received from Dr Cornelius Moras. Surgery canceled in AM.  Patient made aware of situation.

## 2011-08-20 NOTE — Procedures (Signed)
CT bone marrow bx No complication No blood loss. See complete dictation in [facility] PACS.  

## 2011-08-20 NOTE — Progress Notes (Signed)
Reminded pt to notify RN if he has any chest pain. Pt called says he had chest pressure generalized BP 126/78 HR 62 resp 20 02 sat 96 % on RA - 12 lead EKG done without changes  Anesthesiologist here & looked at EKG. On NTG at 10 mcg/min. Pt says he's just upset that  his surgery has been postponed until 08/21/11. Famil kept abreast of when he is to have PFT's done and Bone marrow aspiration today. Marisa Cyphers RN

## 2011-08-20 NOTE — Progress Notes (Signed)
SUBJECTIVE: No chest pain overnight. No events.   BP 117/65  Pulse 61  Temp(Src) 97.8 F (36.6 C) (Oral)  Resp 20  Ht 5\' 11"  (1.803 m)  Wt 218 lb 4.1 oz (99 kg)  BMI 30.44 kg/m2  SpO2 96%  Intake/Output Summary (Last 24 hours) at 08/20/11 0739 Last data filed at 08/20/11 0555  Gross per 24 hour  Intake 1171.25 ml  Output   1225 ml  Net -53.75 ml    PHYSICAL EXAM General: Well developed, well nourished, in no acute distress. Alert and oriented x 3.  Psych:  Good affect, responds appropriately Neck: No JVD. No masses noted.  Lungs: Clear bilaterally with no wheezes or rhonci noted.  Heart: RRR with no murmurs noted. Abdomen: Bowel sounds are present. Soft, non-tender.  Extremities: No lower extremity edema.   LABS: Basic Metabolic Panel:  Basename 08/19/11 0510 08/18/11 0612  NA 138 138  K 3.7 3.9  CL 102 102  CO2 30 30  GLUCOSE 130* 126*  BUN 16 15  CREATININE 0.90 0.88  CALCIUM 9.0 9.3  MG -- --  PHOS -- --   CBC:  Basename 08/20/11 0600 08/19/11 0510  WBC 4.7 3.8*  NEUTROABS -- --  HGB 13.4 12.8*  HCT 38.9* 37.1*  MCV 90.0 90.3  PLT 105* 93*     Current Meds:    . aminocaproic acid (AMICAR) for OHS   Intravenous To OR  . aspirin EC  81 mg Oral Daily  . azithromycin  250 mg Oral Daily  . cefUROXime (ZINACEF)  IV  1.5 g Intravenous To OR  . cefUROXime (ZINACEF)  IV  750 mg Intravenous To OR  . chlorhexidine  60 mL Topical Once  . chlorhexidine  60 mL Topical Once  . dexmedetomidine (PRECEDEX) IV infusion for high rates  0.1-0.7 mcg/kg/hr Intravenous To OR  . DOPamine  2-20 mcg/kg/min Intravenous To OR  . epinephrine  0.5-20 mcg/min Intravenous To OR  . heparin-papaverine-plasmalyte irrigation   Irrigation To OR  . insulin aspart  0-15 Units Subcutaneous TID WC  . insulin aspart  0-5 Units Subcutaneous QHS  . insulin (NOVOLIN-R) infusion   Intravenous To OR  . magnesium sulfate  40 mEq Other To OR  . metoprolol tartrate  12.5 mg Oral Once  .  metoprolol tartrate  25 mg Oral BID  . multivitamin  1 tablet Oral Daily  . nitroGLYCERIN  2-200 mcg/min Intravenous To OR  . phenylephrine (NEO-SYNEPHRINE) Adult infusion  30-200 mcg/min Intravenous To OR  . potassium chloride  80 mEq Other To OR  . rosuvastatin  40 mg Oral q1800  . vancomycin  1,500 mg Intravenous To OR  . DISCONTD: aminocaproic acid (AMICAR) for OHS   Intravenous To OR  . DISCONTD: cefUROXime (ZINACEF)  IV  1.5 g Intravenous To OR  . DISCONTD: cefUROXime (ZINACEF)  IV  750 mg Intravenous To OR  . DISCONTD: chlorhexidine  60 mL Topical Once  . DISCONTD: chlorhexidine  60 mL Topical Once  . DISCONTD: dexmedetomidine (PRECEDEX) IV infusion for high rates  0.1-0.7 mcg/kg/hr Intravenous To OR  . DISCONTD: DOPamine  2-20 mcg/kg/min Intravenous To OR  . DISCONTD: epinephrine  0.5-20 mcg/min Intravenous To OR  . DISCONTD: heparin-papaverine-plasmalyte irrigation   Irrigation To OR  . DISCONTD: insulin (NOVOLIN-R) infusion   Intravenous To OR  . DISCONTD: magnesium sulfate  40 mEq Other To OR  . DISCONTD: metoprolol tartrate  12.5 mg Oral Once  . DISCONTD: nitroGLYCERIN  2-200  mcg/min Intravenous To OR  . DISCONTD: phenylephrine (NEO-SYNEPHRINE) Adult infusion  30-200 mcg/min Intravenous To OR  . DISCONTD: potassium chloride  80 mEq Other To OR  . DISCONTD: vancomycin  1,500 mg Intravenous To OR     ASSESSMENT AND PLAN:   1. CAD: Pt admitted with chest pain c/w unstable angina. Cath on Friday 08/15/11 with severe proximal LAD disease, probable left main stenosis. PCI is not a good option given his anatomy with proximal LAD stenosis followed by an aneurysmal segment involving two diagonal branches. He has been evaluated by CT surgery for CABG, seen by Dr. Cornelius Moras. CABG postponed until tomorrow 08/21/11. He has had only mild chest pain.  Continue ASA, metoprolol and statin.   2. HTN- Well controlled. Continue present meds   3. Thrombocytopenia: Platelets stable. Heme consult  noted. Likely low grade ITP. OK to proceed with revascularization. BM biopsy today in IR per Hematology.    4. Bronchitis - Stable. D/C anitbiotics.    [name],[name]  12/26/20127:39 AM

## 2011-08-20 NOTE — Progress Notes (Signed)
   CARDIOTHORACIC SURGERY PROGRESS NOTE  5 Days Post-Op  S/P Procedure(s) (LRB): LEFT HEART CATHETERIZATION WITH CORONARY ANGIOGRAM (N/A)  Subjective: One brief episode chest tightness earlier today.  Cough persists, non-productive.  Otherwise feels okay.  Objective: Vital signs in last 24 hours: Temp:  [97.6 F (36.4 C)-97.8 F (36.6 C)] 97.8 F (36.6 C) (12/26 1318) Pulse Rate:  [60-70] 68  (12/26 1530) Cardiac Rhythm:  [-] Normal sinus rhythm (12/26 1135) Resp:  [14-22] 20  (12/26 1530) BP: (96-126)/(40-78) 110/58 mmHg (12/26 1530) SpO2:  [92 %-96 %] 95 % (12/26 1530) Weight:  [99 kg (218 lb 4.1 oz)] 218 lb 4.1 oz (99 kg) (12/26 0555)  Physical Exam:  Rhythm:   sinus  Breath sounds: Coarse rhonchi both sides  Heart sounds:  RRR  Incisions:  n/a  Abdomen:  soft  Extremities:  warm   Intake/Output from previous day: 12/25 0701 - 12/26 0700 In: 1171.3 [P.O.:600; I.V.:571.3] Out: 1225 [Urine:1225] Intake/Output this shift: Total I/O In: 240 [P.O.:240] Out: 220 [Urine:220]  Lab Results:  Basename 08/20/11 0600 08/19/11 0510  WBC 4.7 3.8*  HGB 13.4 12.8*  HCT 38.9* 37.1*  PLT 105* 93*   BMET:  Basename 08/19/11 0510 08/18/11 0612  NA 138 138  K 3.7 3.9  CL 102 102  CO2 30 30  GLUCOSE 130* 126*  BUN 16 15  CREATININE 0.90 0.88  CALCIUM 9.0 9.3    CBG (last 3)   Basename 08/20/11 1229 08/20/11 0559 08/19/11 2123  GLUCAP 124* 154* 173*   PT/INR:   Basename 08/20/11 0600  LABPROT 13.7  INR 1.03    CXR:    Clinical Data: Preop respiratory evaluation for CABG  CHEST - 2 VIEW  Comparison: 08/15/2011  Findings: The lungs are clear without focal infiltrate, edema,  pneumothorax or pleural effusion. Interstitial markings are  diffusely coarsened with chronic features. Cardiopericardial  silhouette is at upper limits of normal for size. Imaged bony  structures of the thorax are intact. Telemetry leads overlie the  chest.  IMPRESSION:  Borderline  cardiomegaly with underlying chronic interstitial  changes. No acute cardiopulmonary process.  Original Report Authenticated By: ERIC A. MANSELL, M.D.   Assessment/Plan: S/P Procedure(s) (LRB): LEFT HEART CATHETERIZATION WITH CORONARY ANGIOGRAM (N/A)  Case postponed due to emergencies in OR Bone marrow bx done today, results pending Cough persists but reportedly improved and no fevers, CXR clear, normal WBC PFTs done, results pending One brief episode chest tightness today on IV NTG  Mr Castille may be at increased risk for a variety of postop complications, but under the circumstances further delay would not likely change his risks.  For CABG in am.   Purcell Nails 08/20/2011 4:46 PM

## 2011-08-20 NOTE — Progress Notes (Signed)
Patient ID: [name], male   DOB: 05-28-1933, 77 y.o.   MRN: [id number]    See previous H/P note from 12/24  Pt was scheduled for Bone Marrow Biopsy 12/24. Was cancelled due to Heart Cath 12/24. Then was scheduled for CABG 12/26 but that  was cancelled per MD.  Now MD wants [place] to move ahead with BM bx today. Pt aware of procedure benefits and risks and agreeable to proceed. Consent in chart.

## 2011-08-21 ENCOUNTER — Encounter (HOSPITAL_COMMUNITY): Payer: Self-pay | Admitting: Anesthesiology

## 2011-08-21 ENCOUNTER — Inpatient Hospital Stay (HOSPITAL_COMMUNITY): Payer: Medicare Other

## 2011-08-21 ENCOUNTER — Encounter (HOSPITAL_COMMUNITY)
Admission: EM | Disposition: A | Discharge status: Home / Self Care | Payer: Self-pay | Source: Home / Self Care | Attending: Thoracic Surgery (Cardiothoracic Vascular Surgery)

## 2011-08-21 ENCOUNTER — Inpatient Hospital Stay (HOSPITAL_COMMUNITY): Payer: Medicare Other | Admitting: Anesthesiology

## 2011-08-21 DIAGNOSIS — I2510 Atherosclerotic heart disease of native coronary artery without angina pectoris: Secondary | ICD-10-CM

## 2011-08-21 DIAGNOSIS — Z951 Presence of aortocoronary bypass graft: Secondary | ICD-10-CM

## 2011-08-21 HISTORY — PX: CORONARY ARTERY BYPASS GRAFT: SHX141

## 2011-08-21 LAB — GLUCOSE, CAPILLARY
Glucose-Capillary: 128 mg/dL — ABNORMAL HIGH (ref 70–99)
Glucose-Capillary: 101 mg/dL — ABNORMAL HIGH (ref 70–99)
Glucose-Capillary: 122 mg/dL — ABNORMAL HIGH (ref 70–99)
Glucose-Capillary: 119 mg/dL — ABNORMAL HIGH (ref 70–99)
Glucose-Capillary: 97 mg/dL (ref 70–99)

## 2011-08-21 LAB — POCT I-STAT 3, ART BLOOD GAS (G3+)
Bicarbonate: 25.6 mEq/L — ABNORMAL HIGH (ref 20.0–24.0)
Bicarbonate: 24 mEq/L (ref 20.0–24.0)
O2 Saturation: 88 %
Patient temperature: 35.6
Patient temperature: 36.9
Patient temperature: 37.5
pCO2 arterial: 43.5 mmHg (ref 35.0–45.0)
pCO2 arterial: 37.4 mmHg (ref 35.0–45.0)
pH, Arterial: 7.4 (ref 7.350–7.450)
pO2, Arterial: 68 mmHg — ABNORMAL LOW (ref 80.0–100.0)

## 2011-08-21 LAB — POCT I-STAT 4, (NA,K, GLUC, HGB,HCT)
Glucose, Bld: 176 mg/dL — ABNORMAL HIGH (ref 70–99)
HCT: 33 % — ABNORMAL LOW (ref 39.0–52.0)
HCT: 27 % — ABNORMAL LOW (ref 39.0–52.0)
Hemoglobin: 11.2 g/dL — ABNORMAL LOW (ref 13.0–17.0)
Potassium: 4 mEq/L (ref 3.5–5.1)
Sodium: 138 mEq/L (ref 135–145)
Sodium: 133 mEq/L — ABNORMAL LOW (ref 135–145)

## 2011-08-21 LAB — CBC
HCT: 31.8 % — ABNORMAL LOW (ref 39.0–52.0)
HCT: 31.5 % — ABNORMAL LOW (ref 39.0–52.0)
Hemoglobin: 11.2 g/dL — ABNORMAL LOW (ref 13.0–17.0)
Hemoglobin: 11.1 g/dL — ABNORMAL LOW (ref 13.0–17.0)
MCV: 90.1 fL (ref 78.0–100.0)
Platelets: 106 10*3/uL — ABNORMAL LOW (ref 150–400)
RBC: 4.37 MIL/uL (ref 4.22–5.81)
RDW: 13.5 % (ref 11.5–15.5)
RDW: 13.5 % (ref 11.5–15.5)
WBC: 6.5 10*3/uL (ref 4.0–10.5)
WBC: 7 10*3/uL (ref 4.0–10.5)
WBC: 7.4 10*3/uL (ref 4.0–10.5)

## 2011-08-21 LAB — CREATININE, SERUM
Creatinine, Ser: 0.72 mg/dL (ref 0.50–1.35)
GFR calc non Af Amer: 88 mL/min — ABNORMAL LOW (ref >90)

## 2011-08-21 LAB — BASIC METABOLIC PANEL
CO2: 23 mEq/L (ref 19–32)
Chloride: 104 mEq/L (ref 96–112)
Potassium: 4.1 mEq/L (ref 3.5–5.1)
Sodium: 138 mEq/L (ref 135–145)

## 2011-08-21 LAB — DIFFERENTIAL
Basophils Absolute: 0 10*3/uL (ref 0.0–0.1)
Eosinophils Absolute: 0 10*3/uL (ref 0.0–0.7)
Eosinophils Relative: 0 % (ref 0–5)
Monocytes Absolute: 0.6 10*3/uL (ref 0.1–1.0)
Neutrophils Relative %: 87 % — ABNORMAL HIGH (ref 43–77)

## 2011-08-21 LAB — HEMOGLOBIN AND HEMATOCRIT, BLOOD
HCT: 26.5 % — ABNORMAL LOW (ref 39.0–52.0)
Hemoglobin: 9.4 g/dL — ABNORMAL LOW (ref 13.0–17.0)

## 2011-08-21 LAB — BLOOD GAS, ARTERIAL
Bicarbonate: 24.9 mEq/L — ABNORMAL HIGH (ref 20.0–24.0)
Patient temperature: 98.6
TCO2: 26 mmol/L (ref 0–100)
pCO2 arterial: 36.4 mmHg (ref 35.0–45.0)
pH, Arterial: 7.45 (ref 7.350–7.450)
pO2, Arterial: 63.6 mmHg — ABNORMAL LOW (ref 80.0–100.0)

## 2011-08-21 LAB — POCT I-STAT, CHEM 8
HCT: 31 % — ABNORMAL LOW (ref 39.0–52.0)
Hemoglobin: 10.5 g/dL — ABNORMAL LOW (ref 13.0–17.0)
Sodium: 139 mEq/L (ref 135–145)
TCO2: 24 mmol/L (ref 0–100)

## 2011-08-21 LAB — APTT: aPTT: 32 seconds (ref 24–37)

## 2011-08-21 SURGERY — CORONARY ARTERY BYPASS GRAFTING (CABG)
Anesthesia: General | Site: Chest | Wound class: Clean

## 2011-08-21 MED ORDER — DEXTROSE 5 % IV SOLN
INTRAVENOUS | Status: DC | PRN
  Administered 2011-08-21: 11:00:00 via INTRAVENOUS

## 2011-08-21 MED ORDER — DEXTROSE 5 % IV SOLN
0.7500 g | INTRAVENOUS | Status: DC | PRN
  Administered 2011-08-21: .75 g via INTRAVENOUS

## 2011-08-21 MED ORDER — MORPHINE SULFATE 2 MG/ML IJ SOLN
1.0000 mg | INTRAMUSCULAR | Status: AC | PRN
  Administered 2011-08-21 (×2): 1 mg via INTRAVENOUS
  Filled 2011-08-21: qty 1

## 2011-08-21 MED ORDER — MORPHINE SULFATE 4 MG/ML IJ SOLN
2.0000 mg | INTRAMUSCULAR | Status: DC | PRN
  Administered 2011-08-21 – 2011-08-23 (×8): 4 mg via INTRAVENOUS
  Filled 2011-08-21 (×8): qty 1

## 2011-08-21 MED ORDER — SODIUM CHLORIDE 0.9 % IV SOLN
INTRAVENOUS | Status: DC | PRN
  Administered 2011-08-21: 08:00:00 via INTRAVENOUS

## 2011-08-21 MED ORDER — PANTOPRAZOLE SODIUM 40 MG PO TBEC
40.0000 mg | DELAYED_RELEASE_TABLET | Freq: Every day | ORAL | Status: DC
  Administered 2011-08-23 – 2011-08-25 (×3): 40 mg via ORAL
  Filled 2011-08-21 (×4): qty 1

## 2011-08-21 MED ORDER — SODIUM CHLORIDE 0.9 % IV SOLN
0.4000 ug/kg/h | INTRAVENOUS | Status: DC
  Filled 2011-08-21: qty 4

## 2011-08-21 MED ORDER — SODIUM CHLORIDE 0.9 % IV SOLN: 250.0000 mL | INTRAVENOUS | Status: DC

## 2011-08-21 MED ORDER — OXYCODONE HCL 5 MG PO TABS
5.0000 mg | ORAL_TABLET | ORAL | Status: DC | PRN
  Administered 2011-08-22 – 2011-08-24 (×9): 10 mg via ORAL
  Administered 2011-08-24: 5 mg via ORAL
  Administered 2011-08-24: 10 mg via ORAL
  Administered 2011-08-24: 5 mg via ORAL
  Administered 2011-08-25 – 2011-08-27 (×12): 10 mg via ORAL
  Filled 2011-08-21: qty 2
  Filled 2011-08-21: qty 1
  Filled 2011-08-21 (×18): qty 2
  Filled 2011-08-21: qty 1
  Filled 2011-08-21 (×4): qty 2

## 2011-08-21 MED ORDER — NITROGLYCERIN IN D5W 200-5 MCG/ML-% IV SOLN
0.0000 ug/min | INTRAVENOUS | Status: DC
  Administered 2011-08-21: 16.67 ug/min via INTRAVENOUS

## 2011-08-21 MED ORDER — MIDAZOLAM HCL 5 MG/5ML IJ SOLN
INTRAMUSCULAR | Status: DC | PRN
  Administered 2011-08-21 (×2): 2 mg via INTRAVENOUS
  Administered 2011-08-21: 4 mg via INTRAVENOUS
  Administered 2011-08-21: 3 mg via INTRAVENOUS
  Administered 2011-08-21: 4 mg via INTRAVENOUS
  Administered 2011-08-21: 3 mg via INTRAVENOUS
  Administered 2011-08-21: 2 mg via INTRAVENOUS
  Administered 2011-08-21: 1 mg via INTRAVENOUS

## 2011-08-21 MED ORDER — POTASSIUM CHLORIDE 10 MEQ/50ML IV SOLN
10.0000 meq | INTRAVENOUS | Status: AC
  Administered 2011-08-21 (×3): 10 meq via INTRAVENOUS

## 2011-08-21 MED ORDER — SODIUM CHLORIDE 0.9 % IV SOLN
INTRAVENOUS | Status: DC | PRN
  Administered 2011-08-21: 11:00:00 via INTRAVENOUS

## 2011-08-21 MED ORDER — FENTANYL CITRATE 0.05 MG/ML IJ SOLN
INTRAMUSCULAR | Status: DC | PRN
  Administered 2011-08-21 (×3): 250 ug via INTRAVENOUS
  Administered 2011-08-21: 50 ug via INTRAVENOUS
  Administered 2011-08-21: 100 ug via INTRAVENOUS
  Administered 2011-08-21: 350 ug via INTRAVENOUS
  Administered 2011-08-21: 50 ug via INTRAVENOUS
  Administered 2011-08-21: 250 ug via INTRAVENOUS

## 2011-08-21 MED ORDER — METOPROLOL TARTRATE 25 MG/10 ML ORAL SUSPENSION
12.5000 mg | Freq: Two times a day (BID) | ORAL | Status: DC
  Filled 2011-08-21: qty 5

## 2011-08-21 MED ORDER — METOPROLOL TARTRATE 1 MG/ML IV SOLN: 2.5000 mg | INTRAVENOUS | Status: DC | PRN

## 2011-08-21 MED ORDER — PHENYLEPHRINE HCL 10 MG/ML IJ SOLN
0.0000 ug/min | INTRAMUSCULAR | Status: DC
  Filled 2011-08-21: qty 2

## 2011-08-21 MED ORDER — CALCIUM CHLORIDE 10 % IV SOLN
1.0000 g | Freq: Once | INTRAVENOUS | Status: AC | PRN
  Filled 2011-08-21: qty 10

## 2011-08-21 MED ORDER — SODIUM CHLORIDE 0.9 % IV SOLN
0.1000 ug/kg/h | INTRAVENOUS | Status: DC
  Administered 2011-08-21: 0.5 ug/kg/h via INTRAVENOUS
  Filled 2011-08-21 (×2): qty 2

## 2011-08-21 MED ORDER — MIDAZOLAM HCL 2 MG/2ML IJ SOLN: 2.0000 mg | INTRAMUSCULAR | Status: DC | PRN

## 2011-08-21 MED ORDER — VANCOMYCIN HCL 1000 MG IV SOLR
1000.0000 mg | Freq: Once | INTRAVENOUS | Status: AC
  Administered 2011-08-21: 1000 mg via INTRAVENOUS
  Filled 2011-08-21: qty 1000

## 2011-08-21 MED ORDER — ACETAMINOPHEN 160 MG/5ML PO SOLN: 975.0000 mg | Freq: Four times a day (QID) | ORAL | Status: DC

## 2011-08-21 MED ORDER — NITROGLYCERIN IN D5W 200-5 MCG/ML-% IV SOLN
INTRAVENOUS | Status: DC | PRN
  Administered 2011-08-21: 33.33 ug/min via INTRAVENOUS

## 2011-08-21 MED ORDER — HEPARIN SODIUM (PORCINE) 1000 UNIT/ML IJ SOLN
INTRAMUSCULAR | Status: DC | PRN
  Administered 2011-08-21: 30000 [IU] via INTRAVENOUS

## 2011-08-21 MED ORDER — AMINOCAPROIC ACID 250 MG/ML IV SOLN: INTRAVENOUS | Status: DC | PRN

## 2011-08-21 MED ORDER — SODIUM CHLORIDE 0.9 % IJ SOLN
3.0000 mL | Freq: Two times a day (BID) | INTRAMUSCULAR | Status: DC
  Administered 2011-08-22 – 2011-08-25 (×7): 3 mL via INTRAVENOUS

## 2011-08-21 MED ORDER — METOPROLOL TARTRATE 12.5 MG HALF TABLET: 12.5000 mg | ORAL_TABLET | Freq: Two times a day (BID) | ORAL | Status: DC

## 2011-08-21 MED ORDER — LACTATED RINGERS IV SOLN: INTRAVENOUS | Status: DC

## 2011-08-21 MED ORDER — ALBUMIN HUMAN 5 % IV SOLN
250.0000 mL | INTRAVENOUS | Status: DC | PRN
  Administered 2011-08-21: 250 mL via INTRAVENOUS

## 2011-08-21 MED ORDER — FAMOTIDINE IN NACL 20-0.9 MG/50ML-% IV SOLN
20.0000 mg | Freq: Two times a day (BID) | INTRAVENOUS | Status: AC
  Administered 2011-08-21: 20 mg via INTRAVENOUS

## 2011-08-21 MED ORDER — VECURONIUM BROMIDE 10 MG IV SOLR
INTRAVENOUS | Status: DC | PRN
  Administered 2011-08-21 (×2): 5 mg via INTRAVENOUS
  Administered 2011-08-21: 7 mg via INTRAVENOUS
  Administered 2011-08-21: 3 mg via INTRAVENOUS
  Administered 2011-08-21 (×2): 5 mg via INTRAVENOUS

## 2011-08-21 MED ORDER — GELATIN ABSORBABLE MT POWD
OROMUCOSAL | Status: DC | PRN
  Administered 2011-08-21 (×3): via TOPICAL

## 2011-08-21 MED ORDER — DEXTROSE 5 % IV SOLN
1.5000 g | Freq: Two times a day (BID) | INTRAVENOUS | Status: AC
  Administered 2011-08-21 – 2011-08-22 (×3): 1.5 g via INTRAVENOUS
  Filled 2011-08-21 (×4): qty 1.5

## 2011-08-21 MED ORDER — PAPAVERINE HCL 30 MG/ML IJ SOLN
INTRAMUSCULAR | Status: DC | PRN
  Administered 2011-08-21: 60 mg via INTRAVENOUS

## 2011-08-21 MED ORDER — METOPROLOL TARTRATE 25 MG/10 ML ORAL SUSPENSION
12.5000 mg | Freq: Two times a day (BID) | ORAL | Status: DC
  Administered 2011-08-22 – 2011-08-27 (×11): 12.5 mg
  Filled 2011-08-21 (×13): qty 5

## 2011-08-21 MED ORDER — ASPIRIN 81 MG PO CHEW: 324.0000 mg | CHEWABLE_TABLET | Freq: Every day | ORAL | Status: DC

## 2011-08-21 MED ORDER — PROPOFOL 10 MG/ML IV EMUL
INTRAVENOUS | Status: DC | PRN
  Administered 2011-08-21: 80 mg via INTRAVENOUS
  Administered 2011-08-21: 70 mg via INTRAVENOUS

## 2011-08-21 MED ORDER — DOCUSATE SODIUM 100 MG PO CAPS
200.0000 mg | ORAL_CAPSULE | Freq: Every day | ORAL | Status: DC
  Administered 2011-08-22 – 2011-08-27 (×6): 200 mg via ORAL
  Filled 2011-08-21 (×6): qty 2

## 2011-08-21 MED ORDER — SODIUM CHLORIDE 0.45 % IV SOLN
INTRAVENOUS | Status: DC
  Administered 2011-08-21: 20 mL/h via INTRAVENOUS

## 2011-08-21 MED ORDER — SODIUM CHLORIDE 0.9 % IV SOLN
10.0000 g | INTRAVENOUS | Status: DC | PRN
  Administered 2011-08-21: 5 g/h via INTRAVENOUS

## 2011-08-21 MED ORDER — SODIUM CHLORIDE 0.9 % IV SOLN: INTRAVENOUS | Status: DC

## 2011-08-21 MED ORDER — ONDANSETRON HCL 4 MG/2ML IJ SOLN
4.0000 mg | Freq: Four times a day (QID) | INTRAMUSCULAR | Status: DC | PRN
  Administered 2011-08-21 – 2011-08-23 (×2): 4 mg via INTRAVENOUS
  Filled 2011-08-21 (×2): qty 2

## 2011-08-21 MED ORDER — SODIUM CHLORIDE 0.9 % IJ SOLN: 3.0000 mL | INTRAMUSCULAR | Status: DC | PRN

## 2011-08-21 MED ORDER — LACTATED RINGERS IV SOLN
INTRAVENOUS | Status: DC | PRN
  Administered 2011-08-21: 07:00:00 via INTRAVENOUS

## 2011-08-21 MED ORDER — ACETAMINOPHEN 650 MG RE SUPP
650.0000 mg | RECTAL | Status: AC
  Administered 2011-08-21: 650 mg via RECTAL

## 2011-08-21 MED ORDER — PROTAMINE SULFATE 10 MG/ML IV SOLN
INTRAVENOUS | Status: DC | PRN
  Administered 2011-08-21: 300 mg via INTRAVENOUS

## 2011-08-21 MED ORDER — ASPIRIN EC 325 MG PO TBEC
325.0000 mg | DELAYED_RELEASE_TABLET | Freq: Every day | ORAL | Status: DC
  Administered 2011-08-22 – 2011-08-27 (×6): 325 mg via ORAL
  Filled 2011-08-21 (×6): qty 1

## 2011-08-21 MED ORDER — ACETAMINOPHEN 160 MG/5ML PO SOLN: 650.0000 mg | ORAL | Status: AC

## 2011-08-21 MED ORDER — ACETAMINOPHEN 500 MG PO TABS
1000.0000 mg | ORAL_TABLET | Freq: Four times a day (QID) | ORAL | Status: AC
  Administered 2011-08-22 – 2011-08-26 (×16): 1000 mg via ORAL
  Filled 2011-08-21 (×18): qty 2

## 2011-08-21 MED ORDER — BISACODYL 5 MG PO TBEC
10.0000 mg | DELAYED_RELEASE_TABLET | Freq: Every day | ORAL | Status: DC
  Administered 2011-08-22 – 2011-08-26 (×5): 10 mg via ORAL
  Filled 2011-08-21 (×5): qty 2

## 2011-08-21 MED ORDER — BISACODYL 10 MG RE SUPP: 10.0000 mg | Freq: Every day | RECTAL | Status: DC

## 2011-08-21 MED ORDER — DEXTROSE 5 % IV SOLN
INTRAVENOUS | Status: DC | PRN
  Administered 2011-08-21: 08:00:00 via INTRAVENOUS

## 2011-08-21 MED ORDER — SODIUM CHLORIDE 0.9 % IV SOLN
INTRAVENOUS | Status: AC
  Filled 2011-08-21: qty 1

## 2011-08-21 MED ORDER — MAGNESIUM SULFATE 40 MG/ML IJ SOLN
4.0000 g | Freq: Once | INTRAMUSCULAR | Status: AC
  Administered 2011-08-21: 4 g via INTRAVENOUS
  Filled 2011-08-21: qty 100

## 2011-08-21 MED ORDER — SODIUM CHLORIDE 0.9 % IV SOLN
100.0000 [IU] | INTRAVENOUS | Status: DC | PRN
  Administered 2011-08-21: 1 [IU]/h via INTRAVENOUS

## 2011-08-21 SURGICAL SUPPLY — 138 items
ADAPTER CARDIO PERF ANTE/RETRO (ADAPTER) ×1 IMPLANT
ADH SKN CLS APL DERMABOND .7 (GAUZE/BANDAGES/DRESSINGS) ×1
ADPR PRFSN 84XANTGRD RTRGD (ADAPTER) ×1
APL SKNCLS STERI-STRIP NONHPOA (GAUZE/BANDAGES/DRESSINGS)
APPLIER CLIP 9.375 MED OPEN (MISCELLANEOUS)
APPLIER CLIP 9.375 SM OPEN (CLIP)
APR CLP MED 9.3 20 MLT OPN (MISCELLANEOUS)
APR CLP SM 9.3 20 MLT OPN (CLIP)
ATTRACTOMAT 16X20 MAGNETIC DRP (DRAPES) ×2 IMPLANT
BAG DECANTER FOR FLEXI CONT (MISCELLANEOUS) ×2 IMPLANT
BANDAGE ACE 4 STERILE (GAUZE/BANDAGES/DRESSINGS) ×1 IMPLANT
BANDAGE ELASTIC 4 VELCRO ST LF (GAUZE/BANDAGES/DRESSINGS) ×2 IMPLANT
BANDAGE ELASTIC 6 VELCRO ST LF (GAUZE/BANDAGES/DRESSINGS) ×2 IMPLANT
BANDAGE GAUZE ELAST BULKY 4 IN (GAUZE/BANDAGES/DRESSINGS) ×2 IMPLANT
BASKET HEART (ORDER IN 25'S) (MISCELLANEOUS) ×1
BASKET HEART (ORDER IN 25S) (MISCELLANEOUS) ×1 IMPLANT
BENZOIN TINCTURE PRP APPL 2/3 (GAUZE/BANDAGES/DRESSINGS) ×1 IMPLANT
BLADE STERNUM SYSTEM 6 (BLADE) ×2 IMPLANT
BLADE SURG ROTATE 9660 (MISCELLANEOUS) IMPLANT
CANISTER SUCTION 2500CC (MISCELLANEOUS) ×2 IMPLANT
CANNULA GUNDRY RCSP 15FR (MISCELLANEOUS) IMPLANT
CATH CPB KIT OWEN (MISCELLANEOUS) ×2 IMPLANT
CATH HEART VENT LEFT (CATHETERS) IMPLANT
CATH ROBINSON RED A/P 18FR (CATHETERS) ×4 IMPLANT
CATH THORACIC 28FR (CATHETERS) IMPLANT
CATH THORACIC 28FR RT ANG (CATHETERS) IMPLANT
CATH THORACIC 36FR (CATHETERS) ×2 IMPLANT
CATH THORACIC 36FR RT ANG (CATHETERS) ×2 IMPLANT
CLIP APPLIE 9.375 MED OPEN (MISCELLANEOUS) IMPLANT
CLIP APPLIE 9.375 SM OPEN (CLIP) IMPLANT
CLIP FOGARTY SPRING 6M (CLIP) IMPLANT
CLIP RETRACTION 3.0MM CORONARY (MISCELLANEOUS) ×1 IMPLANT
CLIP TI MEDIUM 24 (CLIP) IMPLANT
CLIP TI MEDIUM 6 (CLIP) IMPLANT
CLIP TI WIDE RED SMALL 24 (CLIP) IMPLANT
CLIP TI WIDE RED SMALL 6 (CLIP) IMPLANT
CLOTH BEACON ORANGE TIMEOUT ST (SAFETY) ×2 IMPLANT
CONN Y 3/8X3/8X3/8  BEN (MISCELLANEOUS)
CONN Y 3/8X3/8X3/8 BEN (MISCELLANEOUS) IMPLANT
COVER SURGICAL LIGHT HANDLE (MISCELLANEOUS) ×4 IMPLANT
CRADLE DONUT ADULT HEAD (MISCELLANEOUS) ×2 IMPLANT
DERMABOND ADVANCED (GAUZE/BANDAGES/DRESSINGS) ×1
DERMABOND ADVANCED .7 DNX12 (GAUZE/BANDAGES/DRESSINGS) IMPLANT
DRAIN CHANNEL 32F RND 10.7 FF (WOUND CARE) ×2 IMPLANT
DRAPE CARDIOVASCULAR INCISE (DRAPES) ×2
DRAPE SLUSH/WARMER DISC (DRAPES) ×1 IMPLANT
DRAPE SRG 135X102X78XABS (DRAPES) ×1 IMPLANT
DRSG COVADERM 4X14 (GAUZE/BANDAGES/DRESSINGS) ×2 IMPLANT
ELECT REM PT RETURN 9FT ADLT (ELECTROSURGICAL) ×4
ELECTRODE REM PT RTRN 9FT ADLT (ELECTROSURGICAL) ×2 IMPLANT
GAUZE SPONGE 4X4 12PLY STRL LF (GAUZE/BANDAGES/DRESSINGS) ×1 IMPLANT
GLOVE BIO SURGEON STRL SZ 6 (GLOVE) ×6 IMPLANT
GLOVE BIO SURGEON STRL SZ 6.5 (GLOVE) ×5 IMPLANT
GLOVE BIO SURGEON STRL SZ7 (GLOVE) ×3 IMPLANT
GLOVE BIO SURGEON STRL SZ7.5 (GLOVE) ×3 IMPLANT
GLOVE BIOGEL PI IND STRL 6 (GLOVE) IMPLANT
GLOVE BIOGEL PI IND STRL 6.5 (GLOVE) IMPLANT
GLOVE BIOGEL PI IND STRL 7.0 (GLOVE) IMPLANT
GLOVE BIOGEL PI INDICATOR 6 (GLOVE)
GLOVE BIOGEL PI INDICATOR 6.5 (GLOVE)
GLOVE BIOGEL PI INDICATOR 7.0 (GLOVE) ×2
GLOVE EUDERMIC 7 POWDERFREE (GLOVE) IMPLANT
GLOVE ORTHO TXT STRL SZ7.5 (GLOVE) ×6 IMPLANT
GOWN STRL NON-REIN LRG LVL3 (GOWN DISPOSABLE) ×13 IMPLANT
HEMOSTAT POWDER SURGIFOAM 1G (HEMOSTASIS) ×9 IMPLANT
INSERT FOGARTY 61MM (MISCELLANEOUS) IMPLANT
INSERT FOGARTY XLG (MISCELLANEOUS) ×3 IMPLANT
KIT BASIN OR (CUSTOM PROCEDURE TRAY) ×2 IMPLANT
KIT PAIN CUSTOM (MISCELLANEOUS) IMPLANT
KIT ROOM TURNOVER OR (KITS) ×2 IMPLANT
KIT SUCTION CATH 14FR (SUCTIONS) ×2 IMPLANT
KIT VASOVIEW W/TROCAR VH 2000 (KITS) ×2 IMPLANT
LEAD PACING MYOCARDI (MISCELLANEOUS) ×2 IMPLANT
MARKER GRAFT CORONARY BYPASS (MISCELLANEOUS) ×6 IMPLANT
NS IRRIG 1000ML POUR BTL (IV SOLUTION) ×12 IMPLANT
PACK OPEN HEART (CUSTOM PROCEDURE TRAY) ×2 IMPLANT
PAD ARMBOARD 7.5X6 YLW CONV (MISCELLANEOUS) ×4 IMPLANT
PENCIL BUTTON HOLSTER BLD 10FT (ELECTRODE) ×2 IMPLANT
PUNCH AORTIC ROTATE 4.0MM (MISCELLANEOUS) ×1 IMPLANT
PUNCH AORTIC ROTATE 4.5MM 8IN (MISCELLANEOUS) IMPLANT
PUNCH AORTIC ROTATE 5MM 8IN (MISCELLANEOUS) IMPLANT
SET CARDIOPLEGIA MPS 5001102 (MISCELLANEOUS) ×1 IMPLANT
SOLUTION ANTI FOG 6CC (MISCELLANEOUS) IMPLANT
SPONGE GAUZE 4X4 12PLY (GAUZE/BANDAGES/DRESSINGS) ×4 IMPLANT
SPONGE INTESTINAL PEANUT (DISPOSABLE) IMPLANT
SPONGE LAP 18X18 X RAY DECT (DISPOSABLE) IMPLANT
SPONGE LAP 4X18 X RAY DECT (DISPOSABLE) ×1 IMPLANT
STOPCOCK 4 WAY LG BORE MALE ST (IV SETS) IMPLANT
STRIP CLOSURE SKIN 1/2X4 (GAUZE/BANDAGES/DRESSINGS) ×1 IMPLANT
SUT BONE WAX W31G (SUTURE) ×4 IMPLANT
SUT ETHIBOND NAB MH 2-0 36IN (SUTURE) ×1 IMPLANT
SUT ETHIBOND X763 2 0 SH 1 (SUTURE) ×4 IMPLANT
SUT MNCRL AB 3-0 PS2 18 (SUTURE) ×4 IMPLANT
SUT MNCRL AB 4-0 PS2 18 (SUTURE) IMPLANT
SUT PDS AB 1 CTX 36 (SUTURE) ×4 IMPLANT
SUT PROLENE 2 0 SH DA (SUTURE) IMPLANT
SUT PROLENE 3 0 SH DA (SUTURE) ×3 IMPLANT
SUT PROLENE 3 0 SH1 36 (SUTURE) IMPLANT
SUT PROLENE 4 0 RB 1 (SUTURE) ×2
SUT PROLENE 4 0 SH DA (SUTURE) ×1 IMPLANT
SUT PROLENE 4-0 RB1 .5 CRCL 36 (SUTURE) IMPLANT
SUT PROLENE 5 0 C 1 36 (SUTURE) IMPLANT
SUT PROLENE 6 0 C 1 30 (SUTURE) ×5 IMPLANT
SUT PROLENE 6 0 CC (SUTURE) IMPLANT
SUT PROLENE 7 0 BV 1 (SUTURE) IMPLANT
SUT PROLENE 7 0 BV1 MDA (SUTURE) IMPLANT
SUT PROLENE 7.0 RB 3 (SUTURE) ×6 IMPLANT
SUT PROLENE 8 0 BV175 6 (SUTURE) ×1 IMPLANT
SUT PROLENE BLUE 7 0 (SUTURE) ×2 IMPLANT
SUT PROLENE POLY MONO (SUTURE) IMPLANT
SUT SILK  1 MH (SUTURE) ×1
SUT SILK 1 MH (SUTURE) ×1 IMPLANT
SUT SILK 2 0 SH CR/8 (SUTURE) IMPLANT
SUT SILK 3 0 SH CR/8 (SUTURE) IMPLANT
SUT STEEL 6MS V (SUTURE) ×1 IMPLANT
SUT STEEL STERNAL CCS#1 18IN (SUTURE) ×1 IMPLANT
SUT STEEL SZ 6 DBL 3X14 BALL (SUTURE) ×3 IMPLANT
SUT VIC AB 1 CTX 36 (SUTURE)
SUT VIC AB 1 CTX36XBRD ANBCTR (SUTURE) IMPLANT
SUT VIC AB 2-0 CT1 27 (SUTURE) ×2
SUT VIC AB 2-0 CT1 TAPERPNT 27 (SUTURE) IMPLANT
SUT VIC AB 2-0 CTX 27 (SUTURE) IMPLANT
SUT VIC AB 3-0 SH 27 (SUTURE)
SUT VIC AB 3-0 SH 27X BRD (SUTURE) IMPLANT
SUT VIC AB 3-0 X1 27 (SUTURE) ×1 IMPLANT
SUT VICRYL 4-0 PS2 18IN ABS (SUTURE) IMPLANT
SUTURE E-PAK OPEN HEART (SUTURE) ×2 IMPLANT
SYSTEM SAHARA CHEST DRAIN ATS (WOUND CARE) ×2 IMPLANT
TAPE CLOTH SURG 4X10 WHT LF (GAUZE/BANDAGES/DRESSINGS) ×1 IMPLANT
TAPE PAPER 2X10 WHT MICROPORE (GAUZE/BANDAGES/DRESSINGS) ×1 IMPLANT
TOWEL OR 17X24 6PK STRL BLUE (TOWEL DISPOSABLE) ×2 IMPLANT
TOWEL OR 17X26 10 PK STRL BLUE (TOWEL DISPOSABLE) ×2 IMPLANT
TRAY FOLEY IC TEMP SENS 14FR (CATHETERS) ×2 IMPLANT
TUBE SUCT INTRACARD DLP 20F (MISCELLANEOUS) ×2 IMPLANT
TUBING INSUFFLATION 10FT LAP (TUBING) ×2 IMPLANT
UNDERPAD 30X30 INCONTINENT (UNDERPADS AND DIAPERS) ×2 IMPLANT
VENT LEFT HEART 12002 (CATHETERS)
WATER STERILE IRR 1000ML POUR (IV SOLUTION) ×4 IMPLANT

## 2011-08-21 NOTE — Op Note (Signed)
CARDIOTHORACIC SURGERY OPERATIVE NOTE  Date of Procedure: 08/21/2011  Preoperative Diagnosis:   Severe 3-vessel Coronary Artery Disease  Postoperative Diagnosis:   Same  Procedure:    Coronary Artery Bypass Grafting x 3   Left Internal Mammary Artery to Distal Left Anterior Descending Coronary Artery  Saphenous Vein Graft to Acute Marginal Branch of Right Coronary Artery  Saphenous Vein Graft to Diagonal Branch of Left Anterior Descending Coronary Artery  Endoscopic Vein Harvest from Thigh  Surgeon: Salvatore Decent. Cornelius Moras, MD  Assistant: Gershon Crane, PA-C  Anesthesia: J. Ernie Avena, MD  Operative Findings:  Normal left ventricular systolic function  Good quality left internal mammary artery conduit  Good quality saphenous vein conduit  Good quality target vessels for grafting  Mild aortic insufficiency   BRIEF CLINICAL NOTE AND INDICATIONS FOR SURGERY  77 yo male with history of AAA repair, glucose intolerance, tobacco abuse and dyslipidemia. Presented with acute onset 10/10 CP. Given ASA and NTG by EMS with relief pf pain. CT angio showed no evidence of PE or dissection, but did show calcification of LAD and L main. Cardiac cath noted to have 3 vessel CAD with complex disease in distal L main and proximal LAD. Referred for CABG.  Seen initially by Dr Dorris Fetch and surgery postponed for workup of thrombocytopenia.  The patient and his family have been counseled at length regarding the indications risks and potential benefits of CABG and provide full informed consent for the operation as described.    DETAILS OF THE OPERATIVE PROCEDURE  The patient is brought to the operating room on the above mentioned date and central monitoring was established by the anesthesia team including placement of Swan-Ganz catheter and radial arterial line. The patient is placed in the supine position on the operating table.  Intravenous antibiotics are administered. General  endotracheal anesthesia is induced uneventfully. A Foley catheter is placed.  Baseline transesophageal echocardiogram was performed.  Findings were notable for normal LV systolic function with mild aortic insufficiency.  The patient's chest, abdomen, both groins, and both lower extremities are prepared and draped in a sterile manner. A time out procedure is performed.  A median sternotomy incision was performed and the left internal mammary artery is dissected from the chest wall and prepared for bypass grafting. The left internal mammary artery is notably good quality conduit. Simultaneously, saphenous vein is obtained from the patient's right thigh using endoscopic vein harvest technique. The saphenous vein is notably quality conduit. After removal of the saphenous vein, the small surgical incisions in the lower extremity are closed with absorbable suture. Following systemic heparinization, the left internal mammary artery was transected distally noted to have excellent flow.  The pericardium is opened. The ascending aorta is normal in appearance. The ascending aorta and the right atrium are cannulated for cardioplegia bypass.  Adequate heparinization is verified.      The entire pre-bypass portion of the operation was notable for stable hemodynamics.  Cardiopulmonary bypass was begun and the surface of the heart is inspected. Distal target vessels are selected for coronary artery bypass grafting. A cardioplegia cannula is placed in the ascending aorta.  A temperature probe was placed in the interventricular septum.  The patient is cooled to 32C systemic temperature.  The aortic cross clamp is applied and cold blood cardioplegia is delivered initially in an antegrade fashion through the aortic root. Iced saline slush is applied for topical hypothermia.  The initial cardioplegic arrest is rapid with early diastolic arrest.  Repeat doses of cardioplegia  are administered intermittently throughout the  entire cross clamp portion of the operation through the aortic root and through subsequently placed vein grafts in order to maintain completely flat electrocardiogram and septal myocardial temperature below 15C.  Myocardial protection was felt to be excellent.  The following distal coronary artery bypass grafts were performed:   The acute marginal branch of the right coronary artery was grafted using a reversed saphenous vein graft in an end-to-side fashion.  At the site of distal anastomosis the target vessel was good quality and measured approximately 2.0 mm in diameter.  This is the dominant terminal branch of the right coronary system.  The diagonal branch of the left anterior descending coronary artery was grafted using a reversed saphenous vein graft in an end-to-side fashion.  At the site of distal anastomosis the target vessel was good quality and measured approximately 2.0 mm in diameter.  This is a high diagonal branch which supplies the majority of the anterolateral wall of the LV.  The distal left anterior coronary artery was grafted with the left internal mammary artery in an end-to-side fashion.  At the site of distal anastomosis the target vessel was good quality and measured approximately 2.0 mm in diameter.   All proximal vein graft anastomoses were placed directly to the ascending aorta prior to removal of the aortic cross clamp.  The septal myocardial temperature rose rapidly after reperfusion of the left internal mammary artery graft.  The aortic cross clamp was removed after a total cross clamp time of 63 minutes.  All proximal and distal coronary anastomoses were inspected for hemostasis and appropriate graft orientation. Epicardial pacing wires are fixed to the right ventricular outflow tract and to the right atrial appendage. The patient is rewarmed to 37C temperature. The patient is weaned and disconnected from cardiopulmonary bypass.  The patient's rhythm at separation from  bypass was sinus.  The patient was weaned from cardioplegic bypass  without any inotropic support. Total cardiopulmonary bypass time for the operation was 79 minutes.  Followup transesophageal echocardiogram performed after separation from bypass revealed  no changes from the preoperative exam.  The aortic and venous cannula were removed uneventfully. Protamine was administered to reverse the anticoagulation. The mediastinum and pleural space were inspected for hemostasis and irrigated with saline solution. The mediastinum and the left pleural space were drained using 3 chest tubes placed through separate stab incisions inferiorly.  The soft tissues anterior to the aorta were reapproximated loosely. The sternum is closed with double strength sternal wire. The soft tissues anterior to the sternum were closed in multiple layers and the skin is closed with a running subcuticular skin closure.   The post-bypass portion of the operation was notable for stable rhythm and hemodynamics.   The patient was transfused 1 pack of adult platelets and 2 units FFP.  The patient tolerated the procedure well and is transported to the surgical intensive care in stable condition. There are no intraoperative complications. All sponge instrument and needle counts are verified correct at completion of the operation.    Salvatore Decent. Cornelius Moras MD 08/21/2011 12:09 PM

## 2011-08-21 NOTE — Preoperative (Signed)
Beta Blockers   Reason not to administer Beta Blockers:Received Metoprolol 12/27 at 0500

## 2011-08-21 NOTE — Procedures (Signed)
Extubation Procedure Note  Patient Details:   Name: [name] DOB: [birth year] MRN: [id number]   Airway Documentation:  Pt extubated to 3L [place]. No stridor noted. Pt able to vocalize. NIF -20. VC .7L  Evaluation  O2 sats: stable throughout and currently acceptable Complications: No apparent complications Patient did tolerate procedure well. Bilateral Breath Sounds: Diminished Suctioning: Oral   Christie Beckers 08/21/2011, 4:57 PM

## 2011-08-21 NOTE — Progress Notes (Signed)
Pt has undergone CABG and is hemodynamically stable.. No apparent ecessive bleeding. Bone marrow revealed normal numbers of megakaryocytes, and so i suspect his plt # is secondary to low grade ITP. I would continue to monitor daily. I don't suspect this will be an issue.  Pierce Crane MD 08/21/11

## 2011-08-21 NOTE — Brief Op Note (Signed)
                   [address]            [place] [address]          [phone]    08/15/2011 - 08/21/2011  10:31 AM  PATIENT:  [name]  77 y.o. male  PRE-OPERATIVE DIAGNOSIS:  coronary artery disease  POST-OPERATIVE DIAGNOSIS: Same  PROCEDURE:  Procedure(s): CORONARY ARTERY BYPASS GRAFTING (CABG)X3 (LIMA-LAD; SVG-AM; SVG-DIAG) EVH Right thigh  SURGEON:  Surgeon(s): Purcell Nails, MD  PHYSICIAN ASSISTANT: Gershon Crane PA-C  ANESTHESIA:   general  PATIENT CONDITION:  ICU - intubated and hemodynamically stable.  PRE-OPERATIVE WEIGHT: 98kg   COMPLICATIONS: No known  Gershon Crane PA-C

## 2011-08-21 NOTE — Anesthesia Preprocedure Evaluation (Addendum)
Anesthesia Evaluation  Patient identified by MRN, date of birth, ID band Patient awake    Reviewed: Allergy & Precautions, H&P , NPO status , Patient's Chart, lab work & pertinent test results, reviewed documented beta blocker date and time   History of Anesthesia Complications (+) Family history of anesthesia reaction  Airway Mallampati: II      Dental  (+) Edentulous Upper and Edentulous Lower   Pulmonary former smoker + rhonchi  + wheezing      Cardiovascular hypertension, + CAD Regular Normal    Neuro/Psych CVA    GI/Hepatic GERD-  ,  Endo/Other  Diabetes mellitus-  Renal/GU      Musculoskeletal   Abdominal (+) obese,   Peds  Hematology   Anesthesia Other Findings   Reproductive/Obstetrics                          Anesthesia Physical Anesthesia Plan  ASA: III  Anesthesia Plan: General   Post-op Pain Management:    Induction: Intravenous  Airway Management Planned: Oral ETT  Additional Equipment: TEE and PA Cath  Intra-op Plan:   Post-operative Plan: Post-operative intubation/ventilation  Informed Consent: I have reviewed the patients History and Physical, chart, labs and discussed the procedure including the risks, benefits and alternatives for the proposed anesthesia with the patient or authorized representative who has indicated his/her understanding and acceptance.   Dental advisory given  Plan Discussed with: CRNA and Surgeon  Anesthesia Plan Comments:         Anesthesia Quick Evaluation

## 2011-08-21 NOTE — Progress Notes (Signed)
Patient ID: [name], male   DOB: 11/01/1932, 77 y.o.   MRN: [id number]  Filed Vitals:   08/21/11 1445 08/21/11 1550 08/21/11 1610 08/21/11 1656  BP:  146/76 111/58 111/66  Pulse: 79 80 80 79  Temp: 97.2 F (36.2 C)   98.4 F (36.9 C)  TempSrc:      Resp: 15 18 17 22   Height:      Weight:      SpO2: 96% 99% 97% 94%   CI=2.42  Extubated.  Pain control appears to be main problem.  Using morphine and a little precedex.  Allergy to Toradol.  CT output low Urine output adequate  CBC    Component Value Date/Time   WBC 7.0 08/21/2011 1250   RBC 3.53* 08/21/2011 1250   HGB 10.5* 08/21/2011 1255   HCT 31.0* 08/21/2011 1255   PLT 95* 08/21/2011 1250   MCV 90.1 08/21/2011 1250   MCH 31.7 08/21/2011 1250   MCHC 35.2 08/21/2011 1250   RDW 13.5 08/21/2011 1250   LYMPHSABS 0.3* 08/15/2011 0443   MONOABS 0.7 08/15/2011 0443   EOSABS 0.1 08/15/2011 0443   BASOSABS 0.0 08/15/2011 0443    BMET    Component Value Date/Time   NA 139 08/21/2011 1255   K 3.8 08/21/2011 1255   CL 104 08/21/2011 0548   CO2 23 08/21/2011 0548   GLUCOSE 149* 08/21/2011 1255   BUN 13 08/21/2011 0548   CREATININE 0.73 08/21/2011 0548   CALCIUM 8.9 08/21/2011 0548   GFRNONAA 88* 08/21/2011 0548   GFRAA >90 08/21/2011 0548     A/P:  Stable s/p CABG.  Continue present postop plans.

## 2011-08-21 NOTE — Transfer of Care (Signed)
Immediate Anesthesia Transfer of Care Note  Patient: TEJ MURDAUGH  Procedure(s) Performed:  CORONARY ARTERY BYPASS GRAFTING (CABG) - Coronary Artery Bypass graft on pump times three utlizing the left internal mammary artery and right greater saphenous vein harvested endoscopically  Patient Location: PACU and SICU  Anesthesia Type: General  Level of Consciousness: sedated, responds to stimulation and Patient remains intubated per anesthesia plan  Airway & Oxygen Therapy: Patient remains intubated per anesthesia plan, Patient placed on Ventilator (see vital sign flow sheet for setting) and .Transported  with full moitors 100%02-[place]. VSS .  Post-op Assessment: Report given to SICU RN(Steve),vss, on ventilator ,to wean . To have CXR and EKG.  Post vital signs: Reviewed and stable  Complications: No apparent anesthesia complications

## 2011-08-21 NOTE — Progress Notes (Signed)
UR Completed.  [name] Jane [phone] 08/21/2011  

## 2011-08-21 NOTE — Anesthesia Procedure Notes (Addendum)
Procedure Name: Intubation Date/Time: 08/21/2011 7:52 AM Performed by: Wray Kearns A Pre-anesthesia Checklist: Patient identified, Timeout performed, Emergency Drugs available, Suction available and Patient being monitored Patient Re-evaluated:Patient Re-evaluated prior to inductionOxygen Delivery Method: Circle System Utilized Preoxygenation: Pre-oxygenation with 100% oxygen Intubation Type: IV induction Ventilation: Mask ventilation without difficulty Laryngoscope Size: Mac and 3 Grade View: Grade I Tube type: Oral Tube size: 8.0 mm Number of attempts: 1 Airway Equipment and Method: stylet Placement Confirmation: ETT inserted through vocal cords under direct vision,  breath sounds checked- equal and bilateral and positive ETCO2 Secured at: 23 cm Tube secured with: Tape Dental Injury: Teeth and Oropharynx as per pre-operative assessment

## 2011-08-22 ENCOUNTER — Inpatient Hospital Stay (HOSPITAL_COMMUNITY): Payer: Medicare Other

## 2011-08-22 ENCOUNTER — Encounter (HOSPITAL_COMMUNITY): Payer: Self-pay | Admitting: Thoracic Surgery (Cardiothoracic Vascular Surgery)

## 2011-08-22 LAB — CBC
Hemoglobin: 12.2 g/dL — ABNORMAL LOW (ref 13.0–17.0)
MCH: 30.6 pg (ref 26.0–34.0)
MCH: 31.1 pg (ref 26.0–34.0)
MCHC: 34.1 g/dL (ref 30.0–36.0)
Platelets: 111 10*3/uL — ABNORMAL LOW (ref 150–400)
Platelets: 128 10*3/uL — ABNORMAL LOW (ref 150–400)
RBC: 3.72 MIL/uL — ABNORMAL LOW (ref 4.22–5.81)
RBC: 3.92 MIL/uL — ABNORMAL LOW (ref 4.22–5.81)

## 2011-08-22 LAB — PREPARE FRESH FROZEN PLASMA: Unit division: 0

## 2011-08-22 LAB — GLUCOSE, CAPILLARY
Glucose-Capillary: 126 mg/dL — ABNORMAL HIGH (ref 70–99)
Glucose-Capillary: 129 mg/dL — ABNORMAL HIGH (ref 70–99)
Glucose-Capillary: 134 mg/dL — ABNORMAL HIGH (ref 70–99)
Glucose-Capillary: 113 mg/dL — ABNORMAL HIGH (ref 70–99)
Glucose-Capillary: 117 mg/dL — ABNORMAL HIGH (ref 70–99)
Glucose-Capillary: 108 mg/dL — ABNORMAL HIGH (ref 70–99)
Glucose-Capillary: 108 mg/dL — ABNORMAL HIGH (ref 70–99)
Glucose-Capillary: 180 mg/dL — ABNORMAL HIGH (ref 70–99)
Glucose-Capillary: 156 mg/dL — ABNORMAL HIGH (ref 70–99)

## 2011-08-22 LAB — MAGNESIUM
Magnesium: 2.6 mg/dL — ABNORMAL HIGH (ref 1.5–2.5)
Magnesium: 2.3 mg/dL (ref 1.5–2.5)

## 2011-08-22 LAB — BASIC METABOLIC PANEL
Chloride: 103 mEq/L (ref 96–112)
GFR calc Af Amer: >90 mL/min (ref >90)
Potassium: 4 mEq/L (ref 3.5–5.1)

## 2011-08-22 LAB — PREPARE PLATELET PHERESIS: Unit division: 0

## 2011-08-22 LAB — CREATININE, SERUM
Creatinine, Ser: 0.81 mg/dL (ref 0.50–1.35)
GFR calc non Af Amer: 84 mL/min — ABNORMAL LOW (ref >90)

## 2011-08-22 LAB — POCT I-STAT, CHEM 8
BUN: 17 mg/dL (ref 6–23)
Calcium, Ion: 1.18 mmol/L (ref 1.12–1.32)
Chloride: 101 mEq/L (ref 96–112)
Glucose, Bld: 171 mg/dL — ABNORMAL HIGH (ref 70–99)
HCT: 35 % — ABNORMAL LOW (ref 39.0–52.0)
Potassium: 4.1 mEq/L (ref 3.5–5.1)

## 2011-08-22 MED ORDER — INSULIN ASPART 100 UNIT/ML ~~LOC~~ SOLN
0.0000 [IU] | SUBCUTANEOUS | Status: DC
  Administered 2011-08-22: 4 [IU] via SUBCUTANEOUS
  Administered 2011-08-22 – 2011-08-23 (×3): 2 [IU] via SUBCUTANEOUS
  Administered 2011-08-23: 4 [IU] via SUBCUTANEOUS
  Administered 2011-08-23 – 2011-08-24 (×4): 2 [IU] via SUBCUTANEOUS
  Administered 2011-08-24: 8 [IU] via SUBCUTANEOUS
  Administered 2011-08-24: 4 [IU] via SUBCUTANEOUS
  Administered 2011-08-24: 2 [IU] via SUBCUTANEOUS
  Administered 2011-08-25: 4 [IU] via SUBCUTANEOUS
  Administered 2011-08-25: 2 [IU] via SUBCUTANEOUS
  Filled 2011-08-22: qty 3

## 2011-08-22 MED ORDER — INSULIN GLARGINE 100 UNIT/ML ~~LOC~~ SOLN
20.0000 [IU] | Freq: Two times a day (BID) | SUBCUTANEOUS | Status: DC
  Administered 2011-08-22 – 2011-08-25 (×8): 20 [IU] via SUBCUTANEOUS
  Filled 2011-08-22: qty 3

## 2011-08-22 MED ORDER — FUROSEMIDE 10 MG/ML IJ SOLN
20.0000 mg | Freq: Four times a day (QID) | INTRAMUSCULAR | Status: AC
  Administered 2011-08-22 – 2011-08-23 (×3): 20 mg via INTRAVENOUS
  Filled 2011-08-22 (×3): qty 2

## 2011-08-22 MED ORDER — MIDAZOLAM HCL 2 MG/2ML IJ SOLN
2.0000 mg | Freq: Once | INTRAMUSCULAR | Status: AC
  Administered 2011-08-22: 2 mg via INTRAVENOUS
  Filled 2011-08-22: qty 2

## 2011-08-22 MED FILL — Sodium Chloride Irrigation Soln 0.9%: Qty: 3000 | Status: AC

## 2011-08-22 MED FILL — Heparin Sodium (Porcine) Inj 1000 Unit/ML: INTRAMUSCULAR | Qty: 30 | Status: AC

## 2011-08-22 MED FILL — Magnesium Sulfate Inj 50%: INTRAMUSCULAR | Qty: 10 | Status: AC

## 2011-08-22 MED FILL — Potassium Chloride Inj 2 mEq/ML: INTRAVENOUS | Qty: 40 | Status: AC

## 2011-08-22 MED FILL — Sodium Chloride IV Soln 0.9%: INTRAVENOUS | Qty: 1000 | Status: AC

## 2011-08-22 NOTE — Progress Notes (Signed)
   CARDIOTHORACIC SURGERY PROGRESS NOTE   R1 Day Post-Op Procedure(s) (LRB): CORONARY ARTERY BYPASS GRAFTING (CABG) (N/A)  Subjective: Looks good.  Mild soreness in chest.  Objective: Vital signs: BP Readings from Last 1 Encounters:  08/22/11 121/67   Pulse Readings from Last 1 Encounters:  08/22/11 84   Resp Readings from Last 1 Encounters:  08/22/11 18   Temp Readings from Last 1 Encounters:  08/22/11 99.1 F (37.3 C)     Hemodynamics: PAP: (16-36)/(7-19) 27/11 mmHg CO:  [4.8 L/min-5.8 L/min] 4.8 L/min CI:  [2.2 L/min/m2-2.6 L/min/m2] 2.2 L/min/m2  Physical Exam:  Rhythm:   sinus  Breath sounds: Coarse rhonchi both sides  Heart sounds:  RRR  Incisions:  Dressings dry, intact  Abdomen:  Soft, non distended, non tender  Extremities:  Warm, well perfused   Intake/Output from previous day: 12/27 0701 - 12/28 0700 In: 7174.1 [I.V.:4708.1; Blood:1626; NG/GT:60; IV Piggyback:780] Out: 5250 [Urine:3230; Emesis/NG output:150; Blood:1500; Chest Tube:370] Intake/Output this shift: Total I/O In: 83.5 [I.V.:83.5] Out: 60 [Urine:60]  Lab Results:  Basename 08/22/11 0500 08/21/11 2030  WBC 9.1 7.4  HGB 11.4* 11.1*  HCT 33.4* 31.5*  PLT 111* 104*   BMET:  Basename 08/22/11 0410 08/21/11 2030 08/21/11 2015 08/21/11 0548  NA 135 -- 139 --  K 4.0 -- 4.1 --  CL 103 -- 104 --  CO2 26 -- -- 23  GLUCOSE 125* -- 120* --  BUN 14 -- 14 --  CREATININE 0.74 0.72 -- --  CALCIUM 8.1* -- -- 8.9    CBG (last 3)   Basename 08/22/11 0913 08/22/11 0809 08/22/11 0741  GLUCAP 129* 105* 117*   ABG    Component Value Date/Time   PHART 7.400 08/21/2011 2012   HCO3 24.0 08/21/2011 2012   TCO2 24 08/21/2011 2015   ACIDBASEDEF 1.0 08/21/2011 2012   O2SAT 92.0 08/21/2011 2012   CXR: Stable postop  Assessment/Plan: S/P Procedure(s) (LRB): CORONARY ARTERY BYPASS GRAFTING (CABG) (N/A)  Doing well POD #1 CABG Expected post op acute blood loss anemia, mild Expected post op  volume excess, mild Type II diabetes on insulin drip Preop bronchitis with chronic cough Preop thrombocytopenia, possible low grade ITP   Mobilize  D/C tubes and lines  Diuresis  Lantus + sliding scale insulin  Pulmonary toilet  Possible transfer to step down later today or tomorrow  OWEN,CLARENCE H 08/22/2011 9:43 AM

## 2011-08-22 NOTE — Progress Notes (Signed)
Patient ID: [name], male   DOB: 13-Dec-1932, 77 y.o.   MRN: [id number] BP 117/78  Pulse 91  Temp(Src) 99 F (37.2 C) (Oral)  Resp 19  Ht 5\' 11"  (1.803 m)  Wt 225 lb 5 oz (102.2 kg)  BMI 31.42 kg/m2  SpO2 93%  Stable day Some cough Walked three hundred feet  Delight Ovens MD  Beeper [phone] Office [phone] 08/22/2011 9:09 PM

## 2011-08-22 NOTE — Op Note (Signed)
NAMESIDDHANTH, [name]                ACCOUNT NO.:  [id number]  MEDICAL RECORD NO.:  [id number]  LOCATION:  2304                         FACILITY:  [facility]  PHYSICIAN:  Burna Forts, M.D.DATE OF BIRTH:  [birth year]  DATE OF PROCEDURE:  08/21/2011 DATE OF DISCHARGE:                              OPERATIVE REPORT   Intraoperative transesophageal echocardiographic report.  INDICATIONS FOR PROCEDURE:  [name] is a 77 year old gentleman who presents today for coronary artery bypass grafting to be performed by Dr. Tressie Stalker.  He is brought to the holding area the morning of surgery where under local anesthesia and sedation, pulmonary artery and radial arterial catheters were placed.  He is then taken to the OR for routine induction of general anesthesia after which the TEE probe is prepared and passed oropharyngeally into the stomach and then slightly withdrawn for imaging of the cardiac structures.  PRE CARDIOPULMONARY BYPASS TEE EXAMINATION:  Left ventricle: The left ventricular chamber is seen initially in the short-axis view.  There is mild concentric left ventricular hypertrophy.  There is overall satisfactory to good contractility appreciated.  Ejection fraction is estimated to be greater than 45%.  All segmental wall areas were thickened and moving inward during systolic contraction.  No masses are noted within and papillary muscles are well outlined. Mitral valve:  This is a thin, compliant, mobile apparatus.  Motion is normal.  On color Doppler, there is a trace central jet of mitral regurgitant flow appreciated. Aortic valve:  The aortic valve is seen initially in the short-axis view.  There are 3 cusps.  There is mild aortic sclerosis.  The aortic valve opens widely.  During diastole and closer, there is a small central jet of aortic insufficiency appreciated. The right ventricle, tricuspid valve, and right atrium are normal structures.  Interrogation of the  interatrial septum reveals a portion of the septum, which is thin and somewhat dilated with an aneurysmal appearance. Overall and with close interrogation, there are no shunts or left or right flow with color Doppler appreciated.  The patient is placed on cardiopulmonary bypass.  Coronary artery bypass grafting is carried out.  The patient is then rewarmed and separated from the bypass with the initial attempt.  POST CARDIOPULMONARY BYPASS TEE EXAMINATION (LIMITED EXAM):  Left ventricle:  The left ventricular chamber is examined in detail in both long and short-axis views in the early post bypass period.  Though there is mild diminution of contractility appreciated with time and separation from cardiopulmonary bypass, all segmental wall areas are thickening and contractile and there is good to excellent contractile pattern appreciated.  No other changes are appreciated in either valvular or left ventricular atrial structures.  This was routine procedure.  The patient was then returned to the cardiac intensive care unit in stable condition.          ______________________________ Burna Forts, M.D.     JTM/MEDQ  D:  08/21/2011  T:  08/22/2011  Job:  [id number]

## 2011-08-22 NOTE — Progress Notes (Signed)
   CARE MANAGEMENT NOTE 08/22/2011  Patient:  [name], [name]   Account Number:  [id number]  Date Initiated:  08/21/2011  Documentation initiated by:  [facility]  Subjective/Objective Assessment:   Admitted with CP - 08-21-11 post op CABG.     Action/Plan:   PTA, PT INDEPENDENT, LIVES WITH SPOUSE, AND HAS 2 SUPPORTIVE DAUGHTERS.   Anticipated DC Date:  08/26/2011   Anticipated DC Plan:  HOME W HOME HEALTH SERVICES      DC Planning Services  CM consult      Choice offered to / List presented to:             Status of service:  In process, will continue to follow Medicare Important Message given?   (If response is "NO", the following Medicare IM given date fields will be blank) Date Medicare IM given:   Date Additional Medicare IM given:    Discharge Disposition:    Per UR Regulation:  Reviewed for med. necessity/level of care/duration of stay  Comments:  08/22/11 Calvin Schuld,RN,BSN 1558 MET WITH PT AND DAUGHTERS TO DISCUSS DC PLANS.  PT STATES WIFE AND DAUGHTERS CAN PROVIDE 24HR CARE AT DISCHARGE.  PT HAS ROLLING WALKER AT HOME, IF NEEDED.  WILL FOLLOW FOR DC NEEDS. Phone #[phone]  08-21-11 1:40pm [name], RNBSN - [phone] UR Completed.

## 2011-08-22 NOTE — Anesthesia Postprocedure Evaluation (Signed)
  Anesthesia Post-op Note  Patient: [name]  Procedure(s) Performed:  CORONARY ARTERY BYPASS GRAFTING (CABG) - Coronary Artery Bypass graft on pump times three utlizing the left internal mammary artery and right greater saphenous vein harvested endoscopically  Patient Location: SICU  Anesthesia Type: General  Level of Consciousness: awake, alert  and oriented  Airway and Oxygen Therapy: Patient Spontanous Breathing and Patient connected to nasal cannula oxygen  Post-op Pain: moderate  Post-op Assessment: Post-op Vital signs reviewed, Patient's Cardiovascular Status Stable, Respiratory Function Stable, Patent Airway, No signs of Nausea or vomiting, Adequate PO intake and Pain level controlled  Post-op Vital Signs: Reviewed and stable  Complications: No apparent anesthesia complications

## 2011-08-23 ENCOUNTER — Inpatient Hospital Stay (HOSPITAL_COMMUNITY): Payer: Medicare Other

## 2011-08-23 LAB — GLUCOSE, CAPILLARY
Glucose-Capillary: 114 mg/dL — ABNORMAL HIGH (ref 70–99)
Glucose-Capillary: 190 mg/dL — ABNORMAL HIGH (ref 70–99)
Glucose-Capillary: 140 mg/dL — ABNORMAL HIGH (ref 70–99)
Glucose-Capillary: 157 mg/dL — ABNORMAL HIGH (ref 70–99)

## 2011-08-23 LAB — EXPECTORATED SPUTUM ASSESSMENT W GRAM STAIN, RFLX TO RESP C

## 2011-08-23 LAB — CBC
HCT: 35.7 % — ABNORMAL LOW (ref 39.0–52.0)
Hemoglobin: 12.2 g/dL — ABNORMAL LOW (ref 13.0–17.0)
RBC: 3.91 MIL/uL — ABNORMAL LOW (ref 4.22–5.81)
WBC: 11.5 10*3/uL — ABNORMAL HIGH (ref 4.0–10.5)

## 2011-08-23 LAB — BASIC METABOLIC PANEL
BUN: 18 mg/dL (ref 6–23)
Chloride: 98 mEq/L (ref 96–112)
Glucose, Bld: 134 mg/dL — ABNORMAL HIGH (ref 70–99)
Potassium: 3.7 mEq/L (ref 3.5–5.1)
Sodium: 136 mEq/L (ref 135–145)

## 2011-08-23 MED ORDER — POTASSIUM CHLORIDE 10 MEQ/50ML IV SOLN
10.0000 meq | INTRAVENOUS | Status: AC | PRN
  Administered 2011-08-23 (×3): 10 meq via INTRAVENOUS
  Filled 2011-08-23 (×2): qty 50

## 2011-08-23 MED ORDER — GUAIFENESIN ER 600 MG PO TB12
600.0000 mg | ORAL_TABLET | Freq: Two times a day (BID) | ORAL | Status: DC
  Administered 2011-08-23 – 2011-08-25 (×5): 600 mg via ORAL
  Filled 2011-08-23 (×7): qty 1

## 2011-08-23 MED ORDER — FUROSEMIDE 10 MG/ML IJ SOLN
40.0000 mg | Freq: Once | INTRAMUSCULAR | Status: AC
  Administered 2011-08-23: 40 mg via INTRAVENOUS
  Filled 2011-08-23 (×2): qty 4

## 2011-08-23 MED ORDER — ACETYLCYSTEINE 10 % IN SOLN
2.0000 mL | Freq: Two times a day (BID) | RESPIRATORY_TRACT | Status: DC
  Filled 2011-08-23 (×3): qty 4

## 2011-08-23 MED ORDER — POTASSIUM CHLORIDE 10 MEQ/50ML IV SOLN
INTRAVENOUS | Status: AC
  Administered 2011-08-23: 10 meq via INTRAVENOUS
  Filled 2011-08-23: qty 50

## 2011-08-23 MED ORDER — ACETYLCYSTEINE 10 % IN SOLN: 2.0000 mL | Freq: Two times a day (BID) | RESPIRATORY_TRACT | Status: DC

## 2011-08-23 MED ORDER — ACETYLCYSTEINE 20 % IN SOLN
1.0000 mL | Freq: Two times a day (BID) | RESPIRATORY_TRACT | Status: DC
  Administered 2011-08-23: 1 mL via RESPIRATORY_TRACT
  Administered 2011-08-23: 30 mL via RESPIRATORY_TRACT
  Administered 2011-08-24 (×2): 1 mL via RESPIRATORY_TRACT
  Administered 2011-08-25: 08:00:00 via RESPIRATORY_TRACT
  Filled 2011-08-23 (×7): qty 30

## 2011-08-23 NOTE — Progress Notes (Signed)
Telemetry reviewed.  No atrial fibrillation.  VSS.  Still with bilateral wheezing.  Hopefully transferring out of ICU tomorrow.   Finley Dinkel S. 08/23/2011

## 2011-08-23 NOTE — Progress Notes (Signed)
Patient ID: [name], male   DOB: Jan 01, 1933, 77 y.o.   MRN: [id number] TCTS DAILY PROGRESS NOTE                   [address]            [place] [address]          [phone]      2 Days Post-Op Procedure(s) (LRB): CORONARY ARTERY BYPASS GRAFTING (CABG) (N/A)  Total Length of Stay:  LOS: 8 days   Subjective: Still with pul congestion and cough  Objective: Vital signs in last 24 hours: Patient Vitals for the past 24 hrs:  BP Temp Temp src Pulse Resp SpO2 Weight  08/23/11 0728 - 98.1 F (36.7 C) Oral - - - -  08/23/11 0700 108/59 mmHg - - 89  21  93 % -  08/23/11 0600 120/81 mmHg - - 94  18  93 % 211 lb 3.2 oz (95.8 kg)  08/23/11 0500 122/72 mmHg - - 89  13  93 % -  08/23/11 0400 120/62 mmHg - - 89  15  93 % -  08/23/11 0315 - 99.4 F (37.4 C) Oral - - - -  08/23/11 0300 118/66 mmHg - - 92  14  94 % -  08/23/11 0200 111/60 mmHg - - 98  20  95 % -  08/23/11 0100 124/64 mmHg - - 86  18  95 % -  08/23/11 0006 - 99.1 F (37.3 C) Oral - - - -  08/23/11 0000 115/65 mmHg - - 81  14  96 % -  08/22/11 2300 111/59 mmHg - - 80  17  96 % -  08/22/11 2200 132/71 mmHg - - 88  19  93 % -  08/22/11 2100 132/69 mmHg - - 84  17  93 % -  08/22/11 2000 110/70 mmHg - - 90  16  94 % -  08/22/11 1923 - 99 F (37.2 C) Oral - - - -  08/22/11 1900 117/78 mmHg - - 91  19  93 % -  08/22/11 1800 130/63 mmHg - - 88  21  95 % -  08/22/11 1700 129/70 mmHg - - 86  17  93 % -  08/22/11 1609 - 97.6 F (36.4 C) Oral - - - -  08/22/11 1600 115/64 mmHg - - 80  21  93 % -  08/22/11 1500 106/60 mmHg - - 79  15  94 % -  08/22/11 1400 116/68 mmHg - - 77  21  95 % -  08/22/11 1300 - - - 79  22  92 % -  08/22/11 1243 - 99.1 F (37.3 C) Oral - - - -  08/22/11 1200 121/72 mmHg - - 82  15  96 % -  08/22/11 1100 116/88 mmHg 99.3 F (37.4 C) - 85  19  94 % -  08/22/11 1000 112/70 mmHg 99.1 F (37.3 C) - 83  15  94 % -  08/22/11 0900 121/67 mmHg 99.1 F (37.3 C) - 84  18  95 % -    08/22/11 0800 109/57 mmHg 99.7 F (37.6 C) - 85  21  94 % -  08/22/11 0759 - 99.9 F (37.7 C) Core - - - -   Wt Readings from Last 3 Encounters:  08/23/11 211 lb 3.2 oz (95.8 kg)  08/23/11 211 lb 3.2 oz (95.8 kg)  08/23/11 211 lb 3.2 oz (95.8  kg)    Hemodynamic parameters for last 24 hours: PAP: (25-28)/(11-14) 28/12 mmHg  Intake/Output from previous day: 12/28 0701 - 12/29 0700 In: 619.9 [P.O.:120; I.V.:347.9; IV Piggyback:152] Out: 1810 [Urine:1760; Chest Tube:50]  Intake/Output this shift:    Current Meds: Scheduled Meds:   . acetaminophen  1,000 mg Oral Q6H  . acetylcysteine  2 mL Nebulization BID  . aspirin EC  325 mg Oral Daily  . bisacodyl  10 mg Oral Daily   Or  . bisacodyl  10 mg Rectal Daily  . cefUROXime (ZINACEF)  IV  1.5 g Intravenous Q12H  . docusate sodium  200 mg Oral Daily  . famotidine (PEPCID) IV  20 mg Intravenous Q12H  . furosemide  20 mg Intravenous Q6H  . guaiFENesin  600 mg Oral BID  . insulin aspart  0-24 Units Subcutaneous Q4H  . insulin glargine  20 Units Subcutaneous BID  . metoprolol tartrate  12.5 mg Per Tube BID  . midazolam  2 mg Intravenous Once  . pantoprazole  40 mg Oral Q1200  . sodium chloride  3 mL Intravenous Q12H   Continuous Infusions:   . sodium chloride 20 mL/hr (08/21/11 1335)  . sodium chloride    . sodium chloride    . insulin (NOVOLIN-R) infusion 3.6 Units/hr (08/21/11 1300)  . nitroGLYCERIN Stopped (08/22/11 0600)  . phenylephrine (NEO-SYNEPHRINE) Adult infusion 0 mcg/min (08/21/11 1400)  . DISCONTD: dexmedetomidine (PRECEDEX) IV infusion Stopped (08/22/11 0600)  . DISCONTD: lactated ringers 60 mL/hr (08/21/11 1300)   PRN Meds:.albuterol, guaiFENesin, metoprolol, midazolam, morphine, ondansetron (ZOFRAN) IV, oxyCODONE, potassium chloride, sodium chloride, DISCONTD: albumin human  General appearance: alert, cooperative and no distress Neurologic: intact Heart: regular rate and rhythm, S1, S2 normal, no murmur,  click, rub or gallop and normal apical impulse Lungs: diminished breath sounds bilaterally and dullness to percussion bilaterally Abdomen: soft, non-tender; bowel sounds normal; no masses,  no organomegaly  Lab Results: CBC: Basename 08/23/11 0445 08/22/11 1700  WBC 11.5* 11.2*  HGB 12.2* 12.2*  HCT 35.7* 35.8*  PLT 131* 128*   BMET:  Basename 08/23/11 0445 08/22/11 1700 08/22/11 1657 08/22/11 0410  NA 136 -- 138 --  K 3.7 -- 4.1 --  CL 98 -- 101 --  CO2 30 -- -- 26  GLUCOSE 134* -- 171* --  BUN 18 -- 17 --  CREATININE 0.83 0.81 -- --  CALCIUM 8.5 -- -- 8.1*    PT/INR:  Basename 08/21/11 1250  LABPROT 17.1*  INR 1.37   Radiology: Dg Chest Portable 1 View In Am  08/22/2011  *RADIOLOGY REPORT*  Clinical Data: Cardiac surgery  PORTABLE CHEST - 1 VIEW  Comparison: Yesterday  Findings: Improved bibasilar atelectasis after extubation and NG tube removal.  Mediastinal and chest tubes stable without pneumothorax.  Swan-Ganz retracted into the left ventricle outflow tract.  Vascular congestion has also improved.  IMPRESSION: Extubation.  Bibasilar atelectasis and vascular congestion improved.  No pneumothorax.  Original Report Authenticated By: Donavan Burnet, M.D.   Dg Chest Portable 1 View  08/21/2011  *RADIOLOGY REPORT*  Clinical Data: Postop.  PORTABLE CHEST - 1 VIEW  Comparison: 08/18/2011 and CT chest 08/15/2011.  Findings: Endotracheal tube is in satisfactory position. Nasogastric tube is followed into the distal esophagus, with the tip projecting beyond the inferior boundary of the film.  Left IJ Swan-Ganz catheter tip projects over the proximal right pulmonary artery.  Mediastinal drain and left chest tubes are in place.  Heart is enlarged, as before.  Prominence  of the mediastinum is likely postoperative in etiology.  Lungs are low in volume with left perihilar and bibasilar atelectasis.  No definite pleural fluid or pneumothorax.  IMPRESSION: Low lung volumes with left perihilar  bibasilar atelectasis.  No definite pneumothorax.  Original Report Authenticated By: Reyes Ivan, M.D.     Assessment/Plan: S/P Procedure(s) (LRB): CORONARY ARTERY BYPASS GRAFTING (CABG) (N/A) Mobilize Diuresis See progression orders Culture sputum Increase resp rx    Delight Ovens MD  Beeper [phone] Office [phone] 08/23/2011 7:31 AM

## 2011-08-24 ENCOUNTER — Inpatient Hospital Stay (HOSPITAL_COMMUNITY): Payer: Medicare Other

## 2011-08-24 LAB — CBC
Hemoglobin: 11.5 g/dL — ABNORMAL LOW (ref 13.0–17.0)
MCH: 31 pg (ref 26.0–34.0)
MCHC: 33.9 g/dL (ref 30.0–36.0)
RDW: 13.7 % (ref 11.5–15.5)

## 2011-08-24 LAB — GLUCOSE, CAPILLARY
Glucose-Capillary: 178 mg/dL — ABNORMAL HIGH (ref 70–99)
Glucose-Capillary: 106 mg/dL — ABNORMAL HIGH (ref 70–99)

## 2011-08-24 LAB — BASIC METABOLIC PANEL
BUN: 22 mg/dL (ref 6–23)
CO2: 32 mEq/L (ref 19–32)
Calcium: 8.6 mg/dL (ref 8.4–10.5)
Chloride: 99 mEq/L (ref 96–112)
Creatinine, Ser: 0.82 mg/dL (ref 0.50–1.35)
GFR calc Af Amer: >90 mL/min (ref >90)
GFR calc non Af Amer: 84 mL/min — ABNORMAL LOW (ref >90)
Glucose, Bld: 145 mg/dL — ABNORMAL HIGH (ref 70–99)
Potassium: 4 mEq/L (ref 3.5–5.1)
Sodium: 135 mEq/L (ref 135–145)

## 2011-08-24 MED ORDER — CIPROFLOXACIN HCL 500 MG PO TABS
500.0000 mg | ORAL_TABLET | Freq: Two times a day (BID) | ORAL | Status: DC
  Administered 2011-08-24 – 2011-08-27 (×7): 500 mg via ORAL
  Filled 2011-08-24 (×9): qty 1

## 2011-08-24 MED ORDER — ENOXAPARIN SODIUM 30 MG/0.3ML ~~LOC~~ SOLN
30.0000 mg | SUBCUTANEOUS | Status: DC
  Administered 2011-08-24 – 2011-08-25 (×2): 30 mg via SUBCUTANEOUS
  Filled 2011-08-24 (×4): qty 0.3

## 2011-08-24 MED ORDER — FUROSEMIDE 10 MG/ML IJ SOLN
40.0000 mg | Freq: Once | INTRAMUSCULAR | Status: AC
  Administered 2011-08-24: 40 mg via INTRAVENOUS
  Filled 2011-08-24: qty 4

## 2011-08-24 NOTE — Progress Notes (Signed)
Still with pulmonary congestion.  VSS.  Wheezing. ABx for bronchitis.  Doing well from cardiac standpoint. Matti Killingsworth S. 08/24/2011

## 2011-08-24 NOTE — Progress Notes (Signed)
Patient ID: [name], male   DOB: 08/19/1933, 77 y.o.   MRN: [id number]                   [address]            [place] [address]          [phone]     3 Days Post-Op Procedure(s) (LRB): CORONARY ARTERY BYPASS GRAFTING (CABG) (N/A)  LOS: 9 days   Subjective: Still with cough and congestion, walking in unit. Says resp rx helps with secreations  Objective: Vital signs in last 24 hours: Patient Vitals for the past 24 hrs:  BP Temp Temp src Pulse Resp SpO2 Weight  08/24/11 0815 - - - - - 94 % -  08/24/11 0728 - 98.8 F (37.1 C) Oral - - - -  08/24/11 0700 121/64 mmHg - - 92  22  94 % 217 lb 9.5 oz (98.7 kg)  08/24/11 0600 103/73 mmHg - - 92  17  93 % -  08/24/11 0500 89/73 mmHg - - 91  19  93 % -  08/24/11 0400 114/67 mmHg - - 99  21  96 % -  08/24/11 0348 - 98.4 F (36.9 C) Oral - - - -  08/24/11 0300 115/53 mmHg - - 83  14  96 % -  08/24/11 0200 95/55 mmHg - - 91  15  94 % -  08/24/11 0100 113/52 mmHg - - 89  15  94 % -  08/24/11 0000 95/58 mmHg - - 85  18  92 % -  08/23/11 2327 - 96.4 F (35.8 C) Axillary - - - -  08/23/11 2321 - - - 80  15  95 % -  08/23/11 2300 104/63 mmHg - - 80  15  94 % -  08/23/11 2200 116/71 mmHg - - 89  19  94 % -  08/23/11 2100 110/66 mmHg - - 86  16  92 % -  08/23/11 2000 - 97.8 F (36.6 C) Oral 95  15  95 % -  08/23/11 1900 - - - 97  20  100 % -  08/23/11 1800 121/65 mmHg - - 92  24  93 % -  08/23/11 1700 130/78 mmHg - - 89  22  96 % -  08/23/11 1600 116/63 mmHg - - 80  16  95 % -  08/23/11 1544 - 97.6 F (36.4 C) Oral - - - -  08/23/11 1500 118/70 mmHg - - 90  20  98 % -  08/23/11 1400 117/72 mmHg - - 84  16  95 % -  08/23/11 1300 102/57 mmHg - - 86  14  95 % -  08/23/11 1200 106/76 mmHg - - 81  17  96 % -  08/23/11 1139 - 98.1 F (36.7 C) Oral - - - -  08/23/11 1100 118/73 mmHg - - 77  24  97 % -  08/23/11 1000 - - - 87  21  95 % -  08/23/11 0957 121/100 mmHg - - 87  - - -  08/23/11 0900 121/100 mmHg - - 87   22  95 % -   Wt Readings from Last 3 Encounters:  08/24/11 217 lb 9.5 oz (98.7 kg)  08/24/11 217 lb 9.5 oz (98.7 kg)  08/24/11 217 lb 9.5 oz (98.7 kg)    Hemodynamic parameters for last 24 hours:    Intake/Output from previous day: 12/29  0701 - 12/30 0700 In: 580 [P.O.:120; I.V.:460] Out: 1225 [Urine:1225] Intake/Output this shift:    Scheduled Meds:   . acetaminophen  1,000 mg Oral Q6H  . acetylcysteine  1 mL Nebulization BID  . aspirin EC  325 mg Oral Daily  . bisacodyl  10 mg Oral Daily   Or  . bisacodyl  10 mg Rectal Daily  . cefUROXime (ZINACEF)  IV  1.5 g Intravenous Q12H  . ciprofloxacin  500 mg Oral BID  . docusate sodium  200 mg Oral Daily  . enoxaparin  30 mg Subcutaneous Q24H  . furosemide  40 mg Intravenous Once  . furosemide  40 mg Intravenous Once  . guaiFENesin  600 mg Oral BID  . insulin aspart  0-24 Units Subcutaneous Q4H  . insulin glargine  20 Units Subcutaneous BID  . metoprolol tartrate  12.5 mg Per Tube BID  . pantoprazole  40 mg Oral Q1200  . sodium chloride  3 mL Intravenous Q12H  . DISCONTD: acetylcysteine  2 mL Nebulization BID  . DISCONTD: acetylcysteine  2 mL Nebulization BID   Continuous Infusions:   . sodium chloride 20 mL/hr (08/21/11 1335)  . sodium chloride    . sodium chloride    . DISCONTD: nitroGLYCERIN Stopped (08/22/11 0600)  . DISCONTD: phenylephrine (NEO-SYNEPHRINE) Adult infusion 0 mcg/min (08/21/11 1400)   PRN Meds:.albuterol, guaiFENesin, metoprolol, ondansetron (ZOFRAN) IV, oxyCODONE, potassium chloride, sodium chloride, DISCONTD: midazolam, DISCONTD: morphine  General appearance: alert, cooperative and no distress Neurologic: intact Heart: regular rate and rhythm, S1, S2 normal, no murmur, click, rub or gallop Lungs: rhonchi bibasilar and wheezes anterior - bilateral Abdomen: soft, non-tender; bowel sounds normal; no masses,  no organomegaly Extremities: extremities normal, atraumatic, no cyanosis or edema, Homans  sign is negative, no sign of DVT and no edema, redness or tenderness in the calves or thighs  Lab Results: CBC: Basename 08/24/11 0445 08/23/11 0445  WBC 9.3 11.5*  HGB 11.5* 12.2*  HCT 33.9* 35.7*  PLT 136* 131*   BMET:  Basename 08/24/11 0445 08/23/11 0445  NA 135 136  K 4.0 3.7  CL 99 98  CO2 32 30  GLUCOSE 145* 134*  BUN 22 18  CREATININE 0.82 0.83  CALCIUM 8.6 8.5    PT/INR:  Basename 08/21/11 1250  LABPROT 17.1*  INR 1.37     Radiology Dg Chest Portable 1 View In Am  08/23/2011  *RADIOLOGY REPORT*  Clinical Data: Postop cardiac surgery with productive cough.  PORTABLE CHEST - 1 VIEW  Comparison: 08/22/2011  Findings: The Swan-Ganz catheter has been removed.  Left jugular central line is still in place.  Chest tubes have been removed without a pneumothorax.  Patchy densities in the left lung are most compatible with atelectasis. Heart size is grossly stable.  IMPRESSION: Removal of the chest drains without a pneumothorax.  Patchy densities in the left lung are most likely related to areas of atelectasis.  Original Report Authenticated By: Richarda Overlie, M.D.   Dg Chest Port 1v Same Day  08/24/2011  *RADIOLOGY REPORT*  Clinical Data: Postop day three, status post CABG.  PORTABLE CHEST - 1 VIEW SAME DAY  Comparison: 07/24/2011  Findings: The left internal jugular introducer sheath remains in place.  Cardiomegaly is present along with indistinct pulmonary vasculature suggesting pulmonary venous hypertension and potentially mild interstitial edema.  Mild residual density is present peripherally in the left midlung along the prior side of the chest tube.  Thoracic spondylosis noted.  There is mild atherosclerotic  calcification of the aortic arch.  Increased obscuration of the left hemidiaphragm noted.  IMPRESSION:  1.  Increase in interstitial accentuation potentially representing pulmonary venous hypertension and mild interstitial edema. 2.  Cardiomegaly. 3.  Mild increase in airspace  opacity in the left lower lobe.  This may reflect atelectasis, pneumonia, or confluent edema.  Original Report Authenticated By: Dellia Cloud, M.D.     Assessment/Plan: S/P Procedure(s) (LRB): CORONARY ARTERY BYPASS GRAFTING (CABG) (N/A) Mobilize Diuresis Diabetes control Continue ABX therapy due to Post-op infection Sputum culture pending, wbc decreasing, no fever but still with evidence of bronchitis  will start po cipro pending cultures Keep in unit for pulmonary toilet  Delight Ovens MD 08/24/2011 8:31 AM

## 2011-08-25 ENCOUNTER — Inpatient Hospital Stay (HOSPITAL_COMMUNITY): Payer: Medicare Other

## 2011-08-25 LAB — BASIC METABOLIC PANEL
BUN: 25 mg/dL — ABNORMAL HIGH (ref 6–23)
CO2: 33 mEq/L — ABNORMAL HIGH (ref 19–32)
Calcium: 9 mg/dL (ref 8.4–10.5)
Chloride: 97 mEq/L (ref 96–112)
Creatinine, Ser: 0.82 mg/dL (ref 0.50–1.35)
GFR calc Af Amer: >90 mL/min (ref >90)
GFR calc non Af Amer: 84 mL/min — ABNORMAL LOW (ref >90)
Glucose, Bld: 156 mg/dL — ABNORMAL HIGH (ref 70–99)
Potassium: 4.1 mEq/L (ref 3.5–5.1)
Sodium: 136 mEq/L (ref 135–145)

## 2011-08-25 LAB — CBC
Platelets: 176 10*3/uL (ref 150–400)
RBC: 4.02 MIL/uL — ABNORMAL LOW (ref 4.22–5.81)
RDW: 13.7 % (ref 11.5–15.5)
WBC: 10 10*3/uL (ref 4.0–10.5)

## 2011-08-25 LAB — CULTURE, RESPIRATORY W GRAM STAIN: Culture: NORMAL

## 2011-08-25 LAB — GLUCOSE, CAPILLARY: Glucose-Capillary: 212 mg/dL — ABNORMAL HIGH (ref 70–99)

## 2011-08-25 MED ORDER — MOVING RIGHT ALONG BOOK
Freq: Once | Status: AC
  Administered 2011-08-25: 16:00:00
  Filled 2011-08-25: qty 1

## 2011-08-25 MED ORDER — MECLIZINE HCL 25 MG PO TABS
32.0000 mg | ORAL_TABLET | Freq: Three times a day (TID) | ORAL | Status: DC | PRN
  Filled 2011-08-25: qty 0.5

## 2011-08-25 MED ORDER — SODIUM CHLORIDE 0.9 % IJ SOLN
3.0000 mL | Freq: Two times a day (BID) | INTRAMUSCULAR | Status: DC
  Administered 2011-08-25 – 2011-08-26 (×4): 3 mL via INTRAVENOUS

## 2011-08-25 MED ORDER — MAGNESIUM HYDROXIDE 400 MG/5ML PO SUSP: 30.0000 mL | Freq: Every day | ORAL | Status: DC | PRN

## 2011-08-25 MED ORDER — OMEGA-3-ACID ETHYL ESTERS 1 G PO CAPS
2.0000 g | ORAL_CAPSULE | Freq: Every day | ORAL | Status: DC
  Administered 2011-08-25 – 2011-08-26 (×2): 2 g via ORAL
  Filled 2011-08-25 (×3): qty 2

## 2011-08-25 MED ORDER — GUAIFENESIN ER 600 MG PO TB12
1200.0000 mg | ORAL_TABLET | Freq: Two times a day (BID) | ORAL | Status: DC
  Administered 2011-08-25 – 2011-08-27 (×4): 1200 mg via ORAL
  Filled 2011-08-25 (×5): qty 2

## 2011-08-25 MED ORDER — ONDANSETRON HCL 4 MG/2ML IJ SOLN: 4.0000 mg | Freq: Four times a day (QID) | INTRAMUSCULAR | Status: DC | PRN

## 2011-08-25 MED ORDER — ZOLPIDEM TARTRATE 5 MG PO TABS: 5.0000 mg | ORAL_TABLET | Freq: Every evening | ORAL | Status: DC | PRN

## 2011-08-25 MED ORDER — OCUVITE-LUTEIN PO CAPS
1.0000 | ORAL_CAPSULE | Freq: Every day | ORAL | Status: DC
  Administered 2011-08-25 – 2011-08-26 (×2): 1 via ORAL
  Filled 2011-08-25 (×3): qty 1

## 2011-08-25 MED ORDER — OMEGA-3 FATTY ACIDS 1000 MG PO CAPS: 2.0000 g | ORAL_CAPSULE | Freq: Every day | ORAL | Status: DC

## 2011-08-25 MED ORDER — ROSUVASTATIN CALCIUM 10 MG PO TABS
10.0000 mg | ORAL_TABLET | Freq: Every day | ORAL | Status: DC
  Administered 2011-08-25 – 2011-08-26 (×2): 10 mg via ORAL
  Filled 2011-08-25 (×3): qty 1

## 2011-08-25 MED ORDER — SODIUM CHLORIDE 0.9 % IV SOLN: 250.0000 mL | INTRAVENOUS | Status: DC | PRN

## 2011-08-25 MED ORDER — TRAMADOL HCL 50 MG PO TABS
50.0000 mg | ORAL_TABLET | ORAL | Status: DC | PRN
  Administered 2011-08-26 – 2011-08-27 (×2): 100 mg via ORAL
  Filled 2011-08-25 (×2): qty 2

## 2011-08-25 MED ORDER — SODIUM CHLORIDE 0.9 % IJ SOLN: 3.0000 mL | INTRAMUSCULAR | Status: DC | PRN

## 2011-08-25 MED ORDER — POTASSIUM CHLORIDE CRYS ER 20 MEQ PO TBCR
20.0000 meq | EXTENDED_RELEASE_TABLET | Freq: Two times a day (BID) | ORAL | Status: DC
  Administered 2011-08-26 – 2011-08-27 (×4): 20 meq via ORAL
  Filled 2011-08-25 (×5): qty 1

## 2011-08-25 MED ORDER — INSULIN ASPART 100 UNIT/ML ~~LOC~~ SOLN
0.0000 [IU] | Freq: Three times a day (TID) | SUBCUTANEOUS | Status: DC
  Administered 2011-08-25: 8 [IU] via SUBCUTANEOUS
  Administered 2011-08-25: 2 [IU] via SUBCUTANEOUS
  Administered 2011-08-26: 4 [IU] via SUBCUTANEOUS
  Administered 2011-08-27: 2 [IU] via SUBCUTANEOUS

## 2011-08-25 MED ORDER — ONDANSETRON HCL 4 MG PO TABS
4.0000 mg | ORAL_TABLET | Freq: Four times a day (QID) | ORAL | Status: DC | PRN
  Administered 2011-08-26: 4 mg via ORAL
  Filled 2011-08-25: qty 1

## 2011-08-25 MED ORDER — DARIFENACIN HYDROBROMIDE ER 7.5 MG PO TB24
7.5000 mg | ORAL_TABLET | Freq: Every day | ORAL | Status: DC
  Administered 2011-08-26 – 2011-08-27 (×2): 7.5 mg via ORAL
  Filled 2011-08-25 (×2): qty 1

## 2011-08-25 MED ORDER — POVIDONE-IODINE 10 % EX SOLN
1.0000 "application " | Freq: Two times a day (BID) | CUTANEOUS | Status: DC
  Administered 2011-08-25 – 2011-08-26 (×4): 1 via TOPICAL
  Filled 2011-08-25: qty 15

## 2011-08-25 NOTE — Progress Notes (Signed)
UR Completed.  Reita Shindler Jane [phone]. 08/25/2011  

## 2011-08-25 NOTE — Progress Notes (Signed)
CARDIAC REHAB PHASE I   PRE:  Rate/Rhythm: 85 SR    BP: sitting 117/62    SaO2: 92  RA  MODE:  Ambulation: 350 ft   POST:  Rate/Rhythm: 107 ST with PAC    BP: sitting 118/85     SaO2: 89-92  RA  Pt tolerated well. Some congestion. SaO2 borderline after walk. To bed, RN present. [phone]  Harriet Masson CES, ACSM

## 2011-08-25 NOTE — Progress Notes (Signed)
4 Days Post-Op Procedure(s) (LRB): CORONARY ARTERY BYPASS GRAFTING (CABG) (N/A) Subjective: Feels better today Still with cough, sputum but decreasing  Objective: Vital signs in last 24 hours: Temp:  [98.2 F (36.8 C)-98.9 F (37.2 C)] 98.2 F (36.8 C) (12/31 0723) Pulse Rate:  [88-106] 90  (12/31 0600) Cardiac Rhythm:  [-] Normal sinus rhythm (12/31 0600) Resp:  [11-24] 20  (12/31 0600) BP: (103-135)/(55-90) 103/66 mmHg (12/31 0600) SpO2:  [93 %-96 %] 95 % (12/31 0600) Weight:  [97.5 kg (214 lb 15.2 oz)] 214 lb 15.2 oz (97.5 kg) (12/31 0400)  Hemodynamic parameters for last 24 hours:    Intake/Output from previous day: 12/30 0701 - 12/31 0700 In: 1360 [P.O.:960; I.V.:400] Out: 1450 [Urine:1450] Intake/Output this shift:    General appearance: alert and no distress Heart: regular rate and rhythm Lungs: diminished breath sounds base - left Abdomen: soft, NT, ventral hernia  Lab Results:  Basename 08/25/11 0500 08/24/11 0445  WBC 10.0 9.3  HGB 12.6* 11.5*  HCT 37.0* 33.9*  PLT 176 136*   BMET:  Basename 08/25/11 0500 08/24/11 0445  NA 136 135  K 4.1 4.0  CL 97 99  CO2 33* 32  GLUCOSE 156* 145*  BUN 25* 22  CREATININE 0.82 0.82  CALCIUM 9.0 8.6    PT/INR: No results found for this basename: LABPROT,INR in the last 72 hours ABG    Component Value Date/Time   PHART 7.400 08/21/2011 2012   HCO3 24.0 08/21/2011 2012   TCO2 28 08/22/2011 1657   ACIDBASEDEF 1.0 08/21/2011 2012   O2SAT 92.0 08/21/2011 2012   CBG (last 3)   Basename 08/25/11 0729 08/25/11 0347 08/24/11 2335  GLUCAP 130* 106* 106*    Assessment/Plan: S/P Procedure(s) (LRB): CORONARY ARTERY BYPASS GRAFTING (CABG) (N/A) Plan for transfer to step-down: see transfer orders CV stable resp status improving continue pulm toilet, cipro Continue ambulation, diuresis   LOS: 10 days    HENDRICKSON,STEVEN C 08/25/2011

## 2011-08-26 ENCOUNTER — Inpatient Hospital Stay (HOSPITAL_COMMUNITY): Payer: Medicare Other

## 2011-08-26 ENCOUNTER — Encounter (HOSPITAL_COMMUNITY): Payer: Self-pay | Admitting: Emergency Medicine

## 2011-08-26 LAB — BASIC METABOLIC PANEL
CO2: 32 mEq/L (ref 19–32)
Chloride: 102 mEq/L (ref 96–112)
Creatinine, Ser: 0.88 mg/dL (ref 0.50–1.35)
GFR calc Af Amer: >90 mL/min (ref >90)
Potassium: 4.1 mEq/L (ref 3.5–5.1)

## 2011-08-26 LAB — CBC
HCT: 34.9 % — ABNORMAL LOW (ref 39.0–52.0)
MCV: 92.8 fL (ref 78.0–100.0)
Platelets: 176 10*3/uL (ref 150–400)
RBC: 3.76 MIL/uL — ABNORMAL LOW (ref 4.22–5.81)
RDW: 13.9 % (ref 11.5–15.5)
WBC: 8.1 10*3/uL (ref 4.0–10.5)

## 2011-08-26 LAB — GLUCOSE, CAPILLARY
Glucose-Capillary: 141 mg/dL — ABNORMAL HIGH (ref 70–99)
Glucose-Capillary: 179 mg/dL — ABNORMAL HIGH (ref 70–99)
Glucose-Capillary: 82 mg/dL (ref 70–99)

## 2011-08-26 MED ORDER — PSYLLIUM 95 % PO PACK: 1.0000 | PACK | Freq: Every day | ORAL | Status: DC

## 2011-08-26 MED ORDER — CIPROFLOXACIN HCL 500 MG PO TABS: 500.0000 mg | ORAL_TABLET | Freq: Two times a day (BID) | ORAL | Status: AC

## 2011-08-26 MED ORDER — GUAIFENESIN ER 600 MG PO TB12: 1200.0000 mg | ORAL_TABLET | Freq: Two times a day (BID) | ORAL | Status: DC

## 2011-08-26 MED ORDER — OXYCODONE HCL 5 MG PO TABS: 5.0000 mg | ORAL_TABLET | ORAL | Status: AC | PRN

## 2011-08-26 MED ORDER — METFORMIN HCL 500 MG PO TABS: 500.0000 mg | ORAL_TABLET | Freq: Two times a day (BID) | ORAL | Status: DC

## 2011-08-26 MED ORDER — PSYLLIUM 95 % PO PACK
1.0000 | PACK | Freq: Once | ORAL | Status: AC
  Administered 2011-08-26: 1 via ORAL
  Filled 2011-08-26: qty 1

## 2011-08-26 MED ORDER — METFORMIN HCL 500 MG PO TABS
500.0000 mg | ORAL_TABLET | Freq: Two times a day (BID) | ORAL | Status: DC
  Administered 2011-08-26 – 2011-08-27 (×3): 500 mg via ORAL
  Filled 2011-08-26 (×5): qty 1

## 2011-08-26 MED ORDER — METOPROLOL TARTRATE 25 MG PO TABS: 12.5000 mg | ORAL_TABLET | Freq: Two times a day (BID) | ORAL | Status: DC

## 2011-08-26 MED ORDER — FUROSEMIDE 40 MG PO TABS
40.0000 mg | ORAL_TABLET | Freq: Every day | ORAL | Status: DC
  Administered 2011-08-26 – 2011-08-27 (×2): 40 mg via ORAL
  Filled 2011-08-26 (×2): qty 1

## 2011-08-26 MED ORDER — ROSUVASTATIN CALCIUM 10 MG PO TABS: 10.0000 mg | ORAL_TABLET | Freq: Every day | ORAL | Status: DC

## 2011-08-26 NOTE — Progress Notes (Addendum)
5 Days Post-Op Procedure(s) (LRB): CORONARY ARTERY BYPASS GRAFTING (CABG) (N/A)  Subjective: Patient passing flatus but no bm yet-requesting Metamucil.Also, with productive cough. Inquired about bm biopsy-no results available yet. He just finished walking around the circle. Hopes to go home soon. Objective: Vital signs in last 24 hours: Patient Vitals for the past 24 hrs:  BP Temp Temp src Pulse Resp SpO2 Weight  08/26/11 0632 102/55 mmHg 97.9 F (36.6 C) Oral 78  19  93 % 210 lb 8.6 oz (95.5 kg)  08/25/11 2122 - - - - - 93 % -  08/25/11 2114 131/65 mmHg - Oral 99  18  90 % -  08/25/11 1500 118/85 mmHg 98.1 F (36.7 C) Oral 91  18  98 % -  08/25/11 1400 110/66 mmHg - - 82  18  90 % -  08/25/11 1300 120/75 mmHg - - 84  19  93 % -  08/25/11 1244 - 99.1 F (37.3 C) Oral - - - -  08/25/11 1200 115/64 mmHg - - 88  21  89 % -  08/25/11 1100 120/71 mmHg - - 94  23  96 % -  08/25/11 1000 135/81 mmHg - - 95  25  96 % -  08/25/11 0900 - - - - 22  99 % -  08/25/11 0815 - - - - - 95 % -  08/25/11 0800 117/61 mmHg - - 84  18  95 % -   Pre op weight  98 kg Current Weight  08/26/11 210 lb 8.6 oz (95.5 kg)      Intake/Output from previous day: 12/31 0701 - 01/01 0700 In: 360 [P.O.:360] Out: 1175 [Urine:1175]   Physical Exam:  Cardiovascular: RRR, no murmurs, gallops, or rubs. Pulmonary: Clear to auscultation on right;decreased breath sounds at left base; no rales, wheezes, or rhonchi. Abdomen: Soft, non tender, bowel sounds present. Extremities: Mild bilateral lower extremity edema. Wounds: Clean and dry.  No erythema or signs of infection.  Lab Results: CBC: Basename 08/26/11 0619 08/25/11 0500  WBC 8.1 10.0  HGB 11.4* 12.6*  HCT 34.9* 37.0*  PLT 176 176   BMET:  Basename 08/26/11 0619 08/25/11 0500  NA 140 136  K 4.1 4.1  CL 102 97  CO2 32 33*  GLUCOSE 78 156*  BUN 25* 25*  CREATININE 0.88 0.82  CALCIUM 9.0 9.0    PT/INR: No results found for this basename:  LABPROT,INR in the last 72 hours ABG:  INR: Will add last result for INR, ABG once components are confirmed Will add last 4 CBG results once components are confirmed  Assessment/Plan:  1. CV - SR/PVCs.Continue Lopressor 12.5 bid. 2.  Pulmonary - Encourage incentive spirometer.Continue Cipro.CXR this am shows some improvement in aeration at left base. 3. Volume Overload - Continue with diuresis. 4.  Acute blood loss anemia - H/H this am 11.4/34.9. 5.DM-HGA1C 6.4. CBGs 212/179/82. Will start low dose Metformin and stop scheduled insulin. Will need follow up as outpatient. 6.Remove EPW 7.Metamucil for constipation. 8.Possibly home 1-2 days.  ZIMMERMAN,DONIELLE M, PA-C 08/26/2011   I have seen and examined the patient and agree with assessment and plan above. Ambulating well, may be able to go home tomorrow

## 2011-08-26 NOTE — Progress Notes (Signed)
Patient ambulated 386ft with rolling walker. Patient tolerated walk well but had to cut down on distance due to having to have a bowel movement.  [name]

## 2011-08-26 NOTE — Progress Notes (Signed)
EPW discontinued per protocol. Tips intact. Patient tolerated well. Last INR INR/Prothrombin Time on   .  Patient advised Bedrest X 1 hour.  Pts chest tube sutures were missing from one site and the others were loose so I applied steri strips to help approximate edges Peggye Pitt, [employer]

## 2011-08-26 NOTE — Progress Notes (Signed)
Patient ambulated with rolling walker 386ft without difficulty.  Patient tolerated walk well.  Patient had previously walked with daughter earlier this morning also 3103ft.  Encouraged one more walk before end of day. [name]

## 2011-08-26 NOTE — Discharge Summary (Signed)
Physician Discharge Summary  Patient ID: [name] MRN: [id number] DOB/AGE: [birth year] 78 y.o.  Admit date: 08/15/2011 Discharge date: 08/26/2011  Admission Diagnoses: 1.Multivessel CAD 2.History of dyslipidemia 3.History of hypertension 4.History of tobacco abuse 5.History of AAA (s/p repair 2000) 6.History of vertigo 7.History of CVA 8.History of prostate cancer 9.Glucose intolerance 10.Thrombocytopenia  Discharge Diagnoses:  1.Multivessel CAD 2.History of dyslipidemia 3.History of hypertension 4.History of tobacco abuse 5.History of AAA (s/p repair 2000) 6.History of vertigo 7.History of CVA 8.History of prostate cancer 9.Probable bronchitis 10.Mild ABL anemia 11.Newly diagnosed DM (pre op HGA1C 6.4) 12.Resolution of thrombocytopenia   Procedure (s):  1.Cardiac Catheterization done by Dr. Elease Hashimoto on 08/15/2011 The LM and LAD are heavily calcified. All coronaries have moderate ectasia  Left Main: The left main is fairly vague naproxen sodium. The distal left main tapers to a 2030% stenosis at the takeoff of the LAD and circumflex artery.  Left anterior Descending: The left anterior descending artery is very heavily calcified. There is a proximal 60-70% stenosis followed by severely calcified 50% stenosis and then another 60-70% stenosis. The distal LAD appears to be fairly normal with only minor luminal or right leg is in his overall good target.  There 3 diagonal branches which originate in the proximal/mid LAD which have mild to moderate irregular she's.  Left Circumflex: The left circumflex artery is selectively small branch. It arises in the middle of the distal left main stenosis. The circumflex is a small to moderate size branching gives off a moderate-sized first obtuse marginal branch. The distal circumflex has minor luminal) allergies.  Right Coronary Artery: The right coronary artery is large and dominant. There is moderate diffuse disease throughout the right  coronary artery. There is a mid stenosis of 40% in the mid RCA. Did The posterior descending artery and the posterior lateral segment artery are unremarkable.  LV Gram: The left ventriculogram was performed in the 30 RAO position. It reveals normal left ventricular systolic function. Ejection fraction is proximal to 65% 2.Bone Marrow Biopsy done 08/20/2011 3.Coronary Artery Bypass Grafting x 3 by Dr. Cornelius Moras 08/21/2011: Left Internal Mammary Artery to Distal Left Anterior Descending Coronary Artery  Saphenous Vein Graft to Acute Marginal Branch of Right Coronary Artery  Saphenous Vein Graft to Diagonal Branch of Left Anterior Descending Coronary Artery  Endoscopic Vein Harvest from Thigh   History of Presenting Illness: This is a 78 yo Caucasian male with history of AAA repair, glucose intolerance, tobacco abuse and dyslipidemia. He presented to [facility] on 08/15/2011  with acute onset 10/10 CP. He was given ASA and NTG by EMS with relief pf pain.   He has not had CP since admission but has had a productive coughCT angio showed no evidence of PE or dissection, but did show calcification of LAD and L main.He then underwent a cardiac catheterization by Dr. Elease Hashimoto on 08/15/2011. He was  found to have 3 vessel CAD with complex disease in distal L main and proximal LAD. A cardiothoracic consultation was obtained with Dr. Dorris Fetch for the consideration of coronary artery bypass grafting surgery. Potential risks, complications, and benefits of the surgery discussed with the patient and he agreed to proceed. Preoperative carotid duplex carotid ultrasound showed no significant internal carotid artery stenosis bilaterally. It should also be noted that the patient was scheduled for bone marrow biopsy (thrombocytopenia)  on 08/18/2011; however, this was canceled as he needed to undergo cardiac catheterization. He then did undergo a bone marrow biopsy on 08/20/2011. He  then underwent CABG x3 by Dr. Cornelius Moras on  08/21/2011.  Brief Hospital Course:  Patient was extubated successfull  early the afternoon of surgery. He remained afebrile and hemodynamically stable.His Swan-Ganz, A-line, chest tubes, and Foley were all removed early in his postoperative course. He was found to have acute blood loss anemia postoperatively. His H&H went as low as 10.5 and 31. He did not require postoperative transfusion and his last H&H was 11.5 and 33.9 respectively. His platelet count went as low as 104,000 but he had resolution of his thrombocytopenia as his last platelet count was up to 176,000. He developed a productive cough and was felt likely to have bronchitis His sputum culture is pending but he was started on Cipro which will be continued for a total of 7 days. He was found to be volume overloaded and diuresed accordingly. He was felt surgically stable for transfer from the intensive care unit to [facility] for further convalescence on 08/25/2011. He was ambulating daily. He's been tolerating a diet and is passing flatus but has not had a bowel movement yet. He will be given Metamucil for constipation. His epicardial pacing wires and chest tube sutures will be removed prior his discharge.Provided he remains afebrile, hemodynamically stable, and pending morning round evaluation, he'll be surgically stable for discharge on 76/09/2010.  Filed Vitals:   08/26/11 0632  BP: 102/55  Pulse: 78  Temp: 97.9 F (36.6 C)  Resp: 19     Latest Vital Signs: Blood pressure 102/55, pulse 78, temperature 97.9 F (36.6 C), temperature source Oral, resp. rate 19, height 5\' 11"  (1.803 m), weight 210 lb 8.6 oz (95.5 kg), SpO2 93.00%.  Physical Exam: Cardiovascular: RRR, no murmurs, gallops, or rubs.  Pulmonary: Clear to auscultation on right;decreased breath sounds at left base; no rales, wheezes, or rhonchi.  Abdomen: Soft, non tender, bowel sounds present.  Extremities: Mild bilateral lower extremity edema.  Wounds: Clean and dry. No  erythema or signs of infection.   Discharge Condition:Stable  Recent laboratory studies:  Lab Results  Component Value Date   WBC 8.1 08/26/2011   HGB 11.4* 08/26/2011   HCT 34.9* 08/26/2011   MCV 92.8 08/26/2011   PLT 176 08/26/2011   Lab Results  Component Value Date   NA 140 08/26/2011   K 4.1 08/26/2011   CL 102 08/26/2011   CO2 32 08/26/2011   CREATININE 0.88 08/26/2011   GLUCOSE 78 08/26/2011      Diagnostic Studies: Dg Chest 2 View  08/25/2011  *RADIOLOGY REPORT*  Clinical Data: Bypass surgery.  PORTABLE CHEST - 1 VIEW  Comparison: Chest x-ray 08/24/2011.  Findings: The left IJ Cordis is stable.  The heart remains enlarged.  Slightly progressive asymmetric airspace process in the left lung could be a combination of edema and atelectasis.  No definite pleural effusion or pneumothorax.  IMPRESSION: Slightly progressive asymmetric airspace process in the left lung.  Original Report Authenticated By: P. Loralie Champagne, M.D.   Discharge Medications: Current Discharge Medication List    START taking these medications   Details  ciprofloxacin (CIPRO) 500 MG tablet Take 1 tablet (500 mg total) by mouth 2 (two) times daily. Qty: 7 tablet, Refills: 0    metFORMIN (GLUCOPHAGE) 500 MG tablet Take 1 tablet (500 mg total) by mouth 2 (two) times daily with a meal. Qty: 60 tablet, Refills: 1    metoprolol tartrate (LOPRESSOR) 25 MG tablet Take 0.5 tablets (12.5 mg total) by mouth 2 (two) times daily. Qty: 30 tablet, Refills:  1    oxyCODONE (OXY IR/ROXICODONE) 5 MG immediate release tablet Take 1-2 tablets (5-10 mg total) by mouth every 4 (four) hours as needed for pain. Qty: 45 tablet, Refills: 0    rosuvastatin (CRESTOR) 10 MG tablet Take 1 tablet (10 mg total) by mouth daily at 6 PM. Qty: 30 tablet, Refills: 1      CONTINUE these medications which have CHANGED   Details  guaiFENesin (MUCINEX) 600 MG 12 hr tablet Take 2 tablets (1,200 mg total) by mouth 2 (two) times daily. For cough.        CONTINUE these medications which have NOT CHANGED   Details  aspirin 325 MG tablet Take 325 mg by mouth daily.      fish oil-omega-3 fatty acids 1000 MG capsule Take 2 g by mouth daily.      meclizine (ANTIVERT) 32 MG tablet Take 32 mg by mouth 3 (three) times daily as needed. For dizziness    multivitamin-lutein (OCUVITE-LUTEIN) CAPS Take 1 capsule by mouth daily.      NON FORMULARY Flex-mint  -- 2 tabs daily     RABEprazole (ACIPHEX) 20 MG tablet Take 20 mg by mouth daily.      solifenacin (VESICARE) 5 MG tablet Take 5 mg by mouth daily.       STOP taking these medications     ibuprofen (ADVIL,MOTRIN) 600 MG tablet         Follow Up Appointments: Follow-up Information    Follow up with Purcell Nails, MD. (PA/LAT CXR to be taken 45 minutes prior to  office appointment with Dr. Orvan July PA;Office will call you with an appoitment date and time)    Contact information:   [address] [place] [address] [phone]       Follow up with Willa Rough, MD. (Call for a follow up appointment for 2 weeks)    Contact information:   [address] [place] [address] [phone]       Follow up with Primary Care Physician. (Call for follow up appointment for further diabetes management)          Signed: Ardelle Balls, PA-C 08/26/2011, 11:10 AM

## 2011-08-27 LAB — GLUCOSE, CAPILLARY: Glucose-Capillary: 124 mg/dL — ABNORMAL HIGH (ref 70–99)

## 2011-08-27 MED ORDER — PROSIGHT PO TABS
1.0000 | ORAL_TABLET | Freq: Every day | ORAL | Status: DC
  Administered 2011-08-27: 1 via ORAL
  Filled 2011-08-27: qty 1

## 2011-08-27 NOTE — Progress Notes (Signed)
[address]            [place] [address]          [phone]     6 Days Post-Op  Procedure(s) (LRB): CORONARY ARTERY BYPASS GRAFTING (CABG) (N/A) Subjective: Feels very well, anxious to go home  Objective  Telemetry NSR  Temp:  [98.1 F (36.7 C)-98.4 F (36.9 C)] 98.1 F (36.7 C) (01/02 0520) Pulse Rate:  [76-94] 93  (01/02 0520) Resp:  [19-20] 20  (01/02 0520) BP: (118-137)/(67-80) 127/79 mmHg (01/02 0520) SpO2:  [90 %-93 %] 93 % (01/02 0520) Weight:  [211 lb 13.8 oz (96.1 kg)] 211 lb 13.8 oz (96.1 kg) (01/02 0313)   Intake/Output Summary (Last 24 hours) at 08/27/11 0819 Last data filed at 08/27/11 0510  Gross per 24 hour  Intake    340 ml  Output   1626 ml  Net  -1286 ml       General appearance: alert and no distress Heart: regular rate and rhythm and S1, S2 normal Lungs: clear to auscultation bilaterally Abdomen: soft, nontender, + BS Extremities: no significant edema Wound: incisions all healing well  Lab Results:  Basename 08/26/11 0619 08/25/11 0500  NA 140 136  K 4.1 4.1  CL 102 97  CO2 32 33*  GLUCOSE 78 156*  BUN 25* 25*  CREATININE 0.88 0.82  CALCIUM 9.0 9.0  MG -- --  PHOS -- --   No results found for this basename: AST:2,ALT:2,ALKPHOS:2,BILITOT:2,PROT:2,ALBUMIN:2 in the last 72 hours No results found for this basename: LIPASE:2,AMYLASE:2 in the last 72 hours  Basename 08/26/11 0619 08/25/11 0500  WBC 8.1 10.0  NEUTROABS -- --  HGB 11.4* 12.6*  HCT 34.9* 37.0*  MCV 92.8 92.0  PLT 176 176   No results found for this basename: CKTOTAL:4,CKMB:4,TROPONINI:4 in the last 72 hours No components found with this basename: POCBNP:3 No results found for this basename: DDIMER in the last 72 hours No results found for this basename: HGBA1C in the last 72 hours No results found for this basename: CHOL,HDL,LDLCALC,TRIG,CHOLHDL in the last 72 hours No results found for this basename:  TSH,T4TOTAL,FREET3,T3FREE,THYROIDAB in the last 72 hours No results found for this basename: VITAMINB12,FOLATE,FERRITIN,TIBC,IRON,RETICCTPCT in the last 72 hours  Medications: Scheduled    . acetaminophen  1,000 mg Oral Q6H  . aspirin EC  325 mg Oral Daily  . bisacodyl  10 mg Oral Daily   Or  . bisacodyl  10 mg Rectal Daily  . ciprofloxacin  500 mg Oral BID  . darifenacin  7.5 mg Oral Daily  . docusate sodium  200 mg Oral Daily  . enoxaparin  30 mg Subcutaneous Q24H  . furosemide  40 mg Oral Daily  . guaiFENesin  1,200 mg Oral BID  . insulin aspart  0-24 Units Subcutaneous TID AC & HS  . metFORMIN  500 mg Oral BID WC  . metoprolol tartrate  12.5 mg Per Tube BID  . multivitamin-lutein  1 capsule Oral Daily  . omega-3 acid ethyl esters  2 g Oral Daily  . pantoprazole  40 mg Oral Q1200  . potassium chloride  20 mEq Oral BID  . povidone-iodine  1 application Topical BID  . psyllium  1 packet Oral Once  . rosuvastatin  10 mg Oral q1800  . sodium chloride  3 mL Intravenous Q12H  . DISCONTD: psyllium  1 packet Oral  Daily     Radiology/Studies:  Dg Chest 2 View  08/26/2011  *RADIOLOGY REPORT*  Clinical Data: Status post CABG.  CHEST - 2 VIEW  Comparison: 08/25/2011  Findings: Improved aeration of both lungs noted with residual atelectasis present in both lower zones, left greater than right. Small amount of pleural fluid remains.  No pneumothorax.  Stable heart size and mediastinal contours.  IMPRESSION: Improved aeration bilaterally.  Small left pleural effusion.  Original Report Authenticated By: Reola Calkins, M.D.    INR: Will add last result for INR, ABG once components are confirmed Will add last 4 CBG results once components are confirmed  Assessment/Plan: S/P Procedure(s) (LRB): CORONARY ARTERY BYPASS GRAFTING (CABG) (N/A)  Doing very well, stable for discharge   LOS: 12 days    Dyan Labarbera E 1/2/20138:19 AM

## 2011-08-27 NOTE — Progress Notes (Signed)
Patient given discharge instructions and medication list. Patient verbalized understanding of instructions. Discussed when to call md and importance of taking medications as scheduled and weighing daily. Removed chest tube sutures per order, 2 ct sites edges were unapproximated and multiple steri strips applied and instructed pt and family to call md if sites start oozing or increased redness or fever.   [name]

## 2011-08-27 NOTE — Discharge Summary (Signed)
I agree with the above discharge summary and plan for follow-up.  OWEN,CLARENCE H  

## 2011-08-27 NOTE — Progress Notes (Signed)
   CARE MANAGEMENT NOTE 08/27/2011  Patient:  [name], [name]   Account Number:  [id number]  Date Initiated:  08/21/2011  Documentation initiated by:  [facility]  Subjective/Objective Assessment:   Admitted with CP - 08-21-11 post op CABG.     Action/Plan:   PTA, PT INDEPENDENT, LIVES WITH SPOUSE, AND HAS 2 SUPPORTIVE DAUGHTERS.   Anticipated DC Date:  08/29/2011   Anticipated DC Plan:  HOME W HOME HEALTH SERVICES      DC Planning Services  CM consult      Choice offered to / List presented to:     DME arranged  Levan Hurst      DME agency  [facility]        Status of service:  Completed, signed off Medicare Important Message given?   (If response is "NO", the following Medicare IM given date fields will be blank) Date Medicare IM given:   Date Additional Medicare IM given:    Discharge Disposition:  HOME/SELF CARE  Per UR Regulation:  Reviewed for med. necessity/level of care/duration of stay  Comments:  08/27/11 Ghadeer Kastelic,RN,BSN PT FOR DC HOME TODAY.  PT DOES NOT HAVE RW AT HOME, AFTER ALL, AND REQUESTS ONE FOR HOME USE.  REFERRAL TO JUSTIN WITH [facility] FOR DME NEEDS.  RW TO BE DELIVERED TO PT ROOM PRIOR TO DC HOME WITH WIFE AND DAUGHTER. Phone #[phone]   08-25-11 11:30am Avie Arenas, RNBSN - [phone] UR completed - tx to acute, progressive, tele unit.  08/22/11 Chanika Byland,RN,BSN 1558 MET WITH PT AND DAUGHTERS TO DISCUSS DC PLANS.  PT STATES WIFE AND DAUGHTERS CAN PROVIDE 24HR CARE AT DISCHARGE.  PT HAS ROLLING WALKER AT HOME, IF NEEDED.  WILL FOLLOW FOR DC NEEDS.  08-21-11 1:40pm Avie Arenas, RNBSN [phone] UR Completed.

## 2011-08-27 NOTE — Progress Notes (Signed)
Cardiac Rehab [phone] Education completed with pt and family. Permission given to refer to [facility] Phase [facility]. Pt would like rolling walker for home use. Strongly encouraged IS and flutter valve and ambulation as pt mentioned staying in bed.Tiandra Swoveland DunlapRN

## 2011-09-02 ENCOUNTER — Telehealth: Payer: Self-pay | Admitting: Cardiology

## 2011-09-02 NOTE — Telephone Encounter (Signed)
Pt states he was supposed to have a two week f/u with Dr Myrtis Ser.  Appt scheduled.

## 2011-09-02 NOTE — Telephone Encounter (Signed)
Pt had open heart surgery by dr Barry Dienes, wife calling to see if he needs to see dr Myrtis Ser for fu appt, will be seeing dr Barry Dienes 09-06-11

## 2011-09-11 ENCOUNTER — Other Ambulatory Visit: Payer: Self-pay | Admitting: Thoracic Surgery (Cardiothoracic Vascular Surgery)

## 2011-09-11 DIAGNOSIS — I2510 Atherosclerotic heart disease of native coronary artery without angina pectoris: Secondary | ICD-10-CM

## 2011-09-12 ENCOUNTER — Encounter: Payer: Self-pay | Admitting: Oncology

## 2011-09-15 ENCOUNTER — Ambulatory Visit
Admission: RE | Admit: 2011-09-15 | Discharge: 2011-09-15 | Disposition: A | Discharge status: Home / Self Care | Payer: Medicare Other | Source: Ambulatory Visit | Attending: Thoracic Surgery (Cardiothoracic Vascular Surgery) | Admitting: Thoracic Surgery (Cardiothoracic Vascular Surgery)

## 2011-09-15 ENCOUNTER — Ambulatory Visit (INDEPENDENT_AMBULATORY_CARE_PROVIDER_SITE_OTHER): Payer: Self-pay | Admitting: Physician Assistant

## 2011-09-15 VITALS — BP 133/82 | HR 79 | Resp 18 | Ht 71.0 in | Wt 197.0 lb

## 2011-09-15 DIAGNOSIS — I2510 Atherosclerotic heart disease of native coronary artery without angina pectoris: Secondary | ICD-10-CM

## 2011-09-15 DIAGNOSIS — Z09 Encounter for follow-up examination after completed treatment for conditions other than malignant neoplasm: Secondary | ICD-10-CM

## 2011-09-15 NOTE — Progress Notes (Signed)
HPI:  Patient returns for routine postoperative follow-up having undergone CABG x3 on 08/21/11 team by Dr. Cornelius Moras. The patient's early postoperative recovery while in the hospital was notable for bronchitis. He was treated with 7 days of Cipro upon discharge. Since hospital discharge the patient reports he has some difficulty sleeping. He denies any fever, chills, chest pain, or shortness of breath.   Current Outpatient Prescriptions  Medication Sig Dispense Refill  . aspirin 81 MG tablet Take 81 mg by mouth daily.      . fish oil-omega-3 fatty acids 1000 MG capsule Take 2 g by mouth daily.        . meclizine (ANTIVERT) 32 MG tablet Take 32 mg by mouth 3 (three) times daily as needed. For dizziness      . metFORMIN (GLUCOPHAGE) 500 MG tablet Take 1 tablet (500 mg total) by mouth 2 (two) times daily with a meal.  60 tablet  1  . metoprolol tartrate (LOPRESSOR) 25 MG tablet Take 0.5 tablets (12.5 mg total) by mouth 2 (two) times daily.  30 tablet  1  . multivitamin-lutein (OCUVITE-LUTEIN) CAPS Take 1 capsule by mouth daily.        . NON FORMULARY Flex-mint  -- 2 tabs daily       . psyllium (METAMUCIL) 58.6 % powder Take 1 packet by mouth daily.      . rosuvastatin (CRESTOR) 10 MG tablet Take 1 tablet (10 mg total) by mouth daily at 6 PM.  30 tablet  1  . zolpidem (AMBIEN) 10 MG tablet Take 10 mg by mouth at bedtime as needed.      [name] aspirin 325 MG tablet Take 325 mg by mouth daily.        [name] guaiFENesin (MUCINEX) 600 MG 12 hr tablet Take 2 tablets (1,200 mg total) by mouth 2 (two) times daily. For cough.      . RABEprazole (ACIPHEX) 20 MG tablet Take 20 mg by mouth daily.        . solifenacin (VESICARE) 5 MG tablet Take 5 mg by mouth daily.        Vital Signs:  BP 133/82, HR 79, RR 18, and O2 Sat 100% on room air. Physical Exam:  Cardiovascular: Regular rate and rhythm; S1-S2; no murmurs, gallops, or rub. Pulmonary: Clear to auscultation bilaterally; no rales, wheezes, rhonchi. Abdomen:  Soft, nontender, bowel sounds present. Extremities: No cyanosis, clubbing, or edema. Right lower extremity wound is clean and dry and well healed. There is some slight swelling around the incision. This may either the a small hematoma or seroma. There is no drainage or erythema. Sternal wound: Clean, dry, well-healed. There are 3 eschars on the previous chest tube sites. These were removed and the wounds were cleaned cleansed with normal saline.  Diagnostic Test: PA lateral chest x-ray done today shows a right lung to be clear, left base atelectasis, and stable enlargement of the cardiopericardial silhouette.    Impression and Plan: Overall, Mr. Sacks continues to make progress status post CABG x3 as an appointment to see the cardiologist) Dr. Myrtis Ser next week. He was instructed to continue taking his Lopressor 12.5 mg by mouth 2 times daily, Crestor 10 mg by mouth at bedtime and enteric-coated aspirin 325 mg by mouth daily. Patient states he is not taking any narcotics for pain. He was instructed he may begin driving short distances i.e. 30 minutes or less during the day for the next week. Provided he tolerates this, he may gradually increase his  routine duration as tolerates. Is encouraged to continue with sternal precautions i.e. no lifting more than 10 pounds for 3-4 more weeks.He was also  instructed he may participate in cardiac rehabilitation, which he will do up in [place]. Finally, and instructed to call make a followup appointment with his medical Dr. regarding further surveillant of his hemoglobin A1c of 6.4. He currently is taking metformin 500 mg by mouth 2 times daily .He will return to see Dr. Cornelius Moras on a when necessary basis.

## 2011-09-25 ENCOUNTER — Ambulatory Visit: Payer: Medicare Other | Admitting: Cardiology

## 2011-09-30 ENCOUNTER — Encounter: Payer: Self-pay | Admitting: Cardiology

## 2011-09-30 DIAGNOSIS — D696 Thrombocytopenia, unspecified: Secondary | ICD-10-CM | POA: Insufficient documentation

## 2011-10-02 ENCOUNTER — Encounter: Payer: Self-pay | Admitting: Cardiology

## 2011-10-02 ENCOUNTER — Ambulatory Visit (INDEPENDENT_AMBULATORY_CARE_PROVIDER_SITE_OTHER): Payer: Medicare Other | Admitting: Cardiology

## 2011-10-02 DIAGNOSIS — E785 Hyperlipidemia, unspecified: Secondary | ICD-10-CM

## 2011-10-02 DIAGNOSIS — I10 Essential (primary) hypertension: Secondary | ICD-10-CM

## 2011-10-02 DIAGNOSIS — I2510 Atherosclerotic heart disease of native coronary artery without angina pectoris: Secondary | ICD-10-CM

## 2011-10-02 DIAGNOSIS — I25810 Atherosclerosis of coronary artery bypass graft(s) without angina pectoris: Secondary | ICD-10-CM | POA: Insufficient documentation

## 2011-10-02 DIAGNOSIS — D696 Thrombocytopenia, unspecified: Secondary | ICD-10-CM

## 2011-10-02 MED ORDER — METOPROLOL TARTRATE 25 MG PO TABS: 12.5000 mg | ORAL_TABLET | Freq: Two times a day (BID) | ORAL | Status: DC

## 2011-10-02 MED ORDER — ROSUVASTATIN CALCIUM 10 MG PO TABS: 10.0000 mg | ORAL_TABLET | Freq: Every day | ORAL | Status: DC

## 2011-10-02 NOTE — Assessment & Plan Note (Signed)
The platelet count was up to 175 on August 26, 2011. I have allowed him to resume his aspirin at 81 mg. He will be seen in hematology for followup of the bone marrow biopsy.

## 2011-10-02 NOTE — Assessment & Plan Note (Signed)
Blood pressure is controlled. No change in therapy. 

## 2011-10-02 NOTE — Assessment & Plan Note (Signed)
I explained to the patient and his family that he will need to remain on a statin indefinitely.

## 2011-10-02 NOTE — Assessment & Plan Note (Signed)
The patient's coronary disease is now stable. He is doing very well post CABG. I've had a lengthy discussion with the patient and his daughter and wife. He can begin to drive a car on a limited basis now. In one month he can begin to drive his tractor which has power steering. He cannot put up any of the heavy equipment to his tractor. He cannot use any of his chainsaws. He did not use any of the equipment that requires a pull start. He is to go to cardiac rehabilitation to help with his overall education. He really looks great.

## 2011-10-02 NOTE — Progress Notes (Signed)
HPI Patient is seen today to follow him after his hospitalization for CABG. I had seen him in the office in October, 2012. We have evaluated him with a stress study that had shown no ischemia. Had good LV function. However he re\re presented with more chest discomfort and ultimately catheterization revealed severe disease. He underwent CABG and has done very well. The surgery was August 21, 2011. I have reviewed the hospital records. He has also had a followup visit in the thoracic surgery office. Also during his hospitalization he had a bone marrow biopsy. This was done for thrombocytopenia. His platelets have been improving. He is to followup with hematology.  He's doing very well. He's very active and he wants to get back to activities sooner than he should. He wants to drive his tractor and use a chain saw. Fortunately his daughter is a Engineer, civil (consulting) and she is very good managing his activities. He's not having any chest pain. Allergies  Allergen Reactions  . Toradol Rash    Current Outpatient Prescriptions  Medication Sig Dispense Refill  . aspirin 81 MG tablet Take 81 mg by mouth daily.      . fish oil-omega-3 fatty acids 1000 MG capsule Take 2 g by mouth daily.        . meclizine (ANTIVERT) 32 MG tablet Take 32 mg by mouth 3 (three) times daily as needed. For dizziness      . metFORMIN (GLUCOPHAGE) 500 MG tablet Take 1 tablet (500 mg total) by mouth 2 (two) times daily with a meal.  60 tablet  1  . metoprolol tartrate (LOPRESSOR) 25 MG tablet Take 0.5 tablets (12.5 mg total) by mouth 2 (two) times daily.  30 tablet  1  . multivitamin-lutein (OCUVITE-LUTEIN) CAPS Take 1 capsule by mouth daily.        . NON FORMULARY Flex-mint  -- 2 tabs daily       . psyllium (METAMUCIL) 58.6 % powder Take 1 packet by mouth daily.      . rosuvastatin (CRESTOR) 10 MG tablet Take 1 tablet (10 mg total) by mouth daily at 6 PM.  30 tablet  1  . zolpidem (AMBIEN) 10 MG tablet Take 10 mg by mouth at bedtime as needed.         History   Social History  . Marital Status: Married    Spouse Name: N/A    Number of Children: 4  . Years of Education: N/A   Occupational History  . retired    Social History Main Topics  . Smoking status: Former Smoker -- 3.0 packs/day for 50 years    Types: Cigarettes    Quit date: 08/25/1998  . Smokeless tobacco: Former Neurosurgeon    Types: Chew   Comment: quit smoking cigarettes "2000"  . Alcohol Use: Yes     08/15/11 "last alcohol was too long ago to count"  . Drug Use: No  . Sexually Active: No   Other Topics Concern  . Not on file   Social History Narrative  . No narrative on file    Family History  Problem Relation Age of Onset  . Heart attack Mother   . Heart attack Father   . Heart attack Brother   . Prostate cancer Brother   . Colon cancer Brother     Past Medical History  Diagnosis Date  . Dizziness   . Fatigue     chronic  . HTN (hypertension)   . Vertigo   . AAA (abdominal  aortic aneurysm)     Surgery Dr Arbie Cookey 2000. /  Ultrasound October, 2012, no significant abnormality, technically difficult  . Dyslipidemia     Triglycerides elevated  . CAD (coronary artery disease)     05/2011 Nuclear normal  /  chest pain December, 2012, CABG  . Elevated glucose   . Headache     October, 2012  . Ejection fraction     EF normal, nuclear, October, 2012  . Prostate cancer     Dr.Wrenn; S/P radiation  . Pneumonia 1940's  . GERD (gastroesophageal reflux disease)   . Arthritis   . CVA (cerebral vascular accident)     Old left frontal infarct by MRI 2008  . Hx of CABG     August 21, 2011, Dr. Cornelius Moras, LIMA to distal LAD, SVG acute marginal of RCA, SVG to diagonal  . Thrombocytopenia     Bone marrow biopsy August 20, 2011  . Carotid artery disease     Doppler, hospital, December, 2012, no significant  carotid stenoses    Past Surgical History  Procedure Date  . Abdominal aortic aneurysm repair ~ 2000  . Femoral artery aneurysm repair ~ 2000  .  Cholecystectomy   . Knee surgery 2009    left; repair  . Inguinal hernia repair     X3?  [name] Coronary artery bypass graft 08/21/2011    Procedure: CORONARY ARTERY BYPASS GRAFTING (CABG);  Surgeon: Purcell Nails, MD;  Location: [facility] OR;  Service: Open Heart Surgery;  Laterality: N/A;  Coronary Artery Bypass graft on pump times three utlizing the left internal mammary artery and right greater saphenous vein harvested endoscopically    ROS  Patient denies fever, chills, headache, sweats, rash, change in vision, change in hearing, chest pain, cough, nausea vomiting, urinary symptoms. All other systems are reviewed and are negative.  PHYSICAL EXAM The patient is a very pleasant cantankerous gentleman. He is here with his daughter and wife. He is oriented to person time and place. Affect is normal. There is no jugular venous distention. Lungs are clear. Respiratory effort is nonlabored. His sternal wound is nicely healed. There is one very small area of drainage . This can be treated with a Band-Aid. There is no sign of infection. The abdomen is soft. He has no peripheral edema. He does have one area of fluid accumulation in the area of his vein harvest. This is known to the surgical team and it is to be watched. He has no musculoskeletal deformities. Filed Vitals:   10/02/11 1001  BP: 118/80  Pulse: 92  Height: 5\' 11"  (1.803 m)  Weight: 204 lb (92.534 kg)  SpO2: 98%   EKG is done today and reviewed by me. I have compared it to the EKGs in the hospital. He has an interventricular conduction delay of the right bundle branch block type. There is sinus rhythm. There is no change.  ASSESSMENT & PLAN

## 2011-10-02 NOTE — Patient Instructions (Signed)
   Cardiac Rehab  Follow up in  3 months - see above

## 2011-11-04 ENCOUNTER — Other Ambulatory Visit (HOSPITAL_COMMUNITY): Payer: Self-pay | Admitting: Physician Assistant

## 2011-11-05 ENCOUNTER — Other Ambulatory Visit (HOSPITAL_COMMUNITY): Payer: Self-pay | Admitting: Physician Assistant

## 2011-11-19 LAB — HEPARIN INDUCED THROMBOCYTOPENIA PNL
UFH High Dose UFH H: 0 % Release
UFH Low Dose 0.5 IU/mL: 0 % Release
UFH SRA Result: NEGATIVE

## 2012-01-06 ENCOUNTER — Ambulatory Visit (INDEPENDENT_AMBULATORY_CARE_PROVIDER_SITE_OTHER): Payer: Medicare Other | Admitting: Cardiology

## 2012-01-06 ENCOUNTER — Encounter: Payer: Self-pay | Admitting: Cardiology

## 2012-01-06 VITALS — BP 116/61 | HR 58 | Resp 18 | Ht 71.0 in | Wt 211.0 lb

## 2012-01-06 DIAGNOSIS — Z951 Presence of aortocoronary bypass graft: Secondary | ICD-10-CM

## 2012-01-06 DIAGNOSIS — L299 Pruritus, unspecified: Secondary | ICD-10-CM

## 2012-01-06 DIAGNOSIS — R42 Dizziness and giddiness: Secondary | ICD-10-CM

## 2012-01-06 DIAGNOSIS — I2510 Atherosclerotic heart disease of native coronary artery without angina pectoris: Secondary | ICD-10-CM

## 2012-01-06 NOTE — Patient Instructions (Signed)
Your physician recommends that you schedule a follow-up appointment in: 6 months. You will receive a reminder letter in the mail 1-2 months in advance. If you don't receive this letter, please contact our office.  Your physician recommends that you continue on your current medications as directed. Please refer to the Current Medication list given to you today.

## 2012-01-06 NOTE — Assessment & Plan Note (Signed)
Coronary disease is stable post CABG. No change in therapy. 

## 2012-01-06 NOTE — Assessment & Plan Note (Signed)
Etiology of current itching and flushing is not clear. It is improving. He uses low-dose Benadryl. No change in therapy.

## 2012-01-06 NOTE — Assessment & Plan Note (Signed)
He's not having any significant dizziness. No change in therapy.

## 2012-01-06 NOTE — Progress Notes (Signed)
HPI Patient is doing very well. He continues to recover post CABG. He is already being active. He's had some mild itching. Benadryl seems to help. He has mild flushing with this. Etiology is not clear.  Allergies  Allergen Reactions  . Ketorolac Tromethamine Rash    Current Outpatient Prescriptions  Medication Sig Dispense Refill  . aspirin 81 MG tablet Take 81 mg by mouth daily.      . fish oil-omega-3 fatty acids 1000 MG capsule Take 2 g by mouth daily.        . meclizine (ANTIVERT) 32 MG tablet Take 32 mg by mouth 3 (three) times daily as needed. For dizziness      . metFORMIN (GLUCOPHAGE) 500 MG tablet Take 1 tablet (500 mg total) by mouth 2 (two) times daily with a meal.  60 tablet  1  . metoprolol tartrate (LOPRESSOR) 25 MG tablet Take 0.5 tablets (12.5 mg total) by mouth 2 (two) times daily.  30 tablet  6  . multivitamin-lutein (OCUVITE-LUTEIN) CAPS Take 1 capsule by mouth daily.        . NON FORMULARY Flex-mint  -- 2 tabs daily       . psyllium (METAMUCIL) 58.6 % powder Take 1 packet by mouth daily.      . rosuvastatin (CRESTOR) 10 MG tablet Take 1 tablet (10 mg total) by mouth daily at 6 PM.  30 tablet  6  . zolpidem (AMBIEN) 10 MG tablet Take 10 mg by mouth at bedtime as needed.        History   Social History  . Marital Status: Married    Spouse Name: N/A    Number of Children: 4  . Years of Education: N/A   Occupational History  . retired    Social History Main Topics  . Smoking status: Former Smoker -- 3.0 packs/day for 50 years    Types: Cigarettes    Quit date: 08/25/1998  . Smokeless tobacco: Former Neurosurgeon    Types: Chew   Comment: quit smoking cigarettes "2000"  . Alcohol Use: Yes     08/15/11 "last alcohol was too long ago to count"  . Drug Use: No  . Sexually Active: No   Other Topics Concern  . Not on file   Social History Narrative  . No narrative on file    Family History  Problem Relation Age of Onset  . Heart attack Mother   . Heart  attack Father   . Heart attack Brother   . Prostate cancer Brother   . Colon cancer Brother     Past Medical History  Diagnosis Date  . Dizziness   . Fatigue     chronic  . HTN (hypertension)   . Vertigo   . AAA (abdominal aortic aneurysm)     Surgery Dr Arbie Cookey 2000. /  Ultrasound October, 2012, no significant abnormality, technically difficult  . Dyslipidemia     Triglycerides elevated  . CAD (coronary artery disease)     05/2011 Nuclear normal  /  chest pain December, 2012, CABG  . Elevated glucose   . Headache     October, 2012  . Ejection fraction     EF normal, nuclear, October, 2012  . Prostate cancer     Dr.Wrenn; S/P radiation  . Pneumonia 1940's  . GERD (gastroesophageal reflux disease)   . Arthritis   . CVA (cerebral vascular accident)     Old left frontal infarct by MRI 2008  . Hx of  CABG     August 21, 2011, Dr. Cornelius Moras, LIMA to distal LAD, SVG acute marginal of RCA, SVG to diagonal  . Thrombocytopenia     Bone marrow biopsy August 20, 2011  . Carotid artery disease     Doppler, hospital, December, 2012, no significant  carotid stenoses    Past Surgical History  Procedure Date  . Abdominal aortic aneurysm repair ~ 2000  . Femoral artery aneurysm repair ~ 2000  . Cholecystectomy   . Knee surgery 2009    left; repair  . Inguinal hernia repair     X3?  [name] Coronary artery bypass graft 08/21/2011    Procedure: CORONARY ARTERY BYPASS GRAFTING (CABG);  Surgeon: Purcell Nails, MD;  Location: [facility] OR;  Service: Open Heart Surgery;  Laterality: N/A;  Coronary Artery Bypass graft on pump times three utlizing the left internal mammary artery and right greater saphenous vein harvested endoscopically    ROS Patient denies fever, chills, headache, sweats, rash, change in vision, change in hearing, chest pain, cough, nausea vomiting, urinary symptoms. All other systems are reviewed and are negative.  PHYSICAL EXAM  Patient is active and talkative as always. He is  here with his family. Lungs are clear. Respiratory effort is nonlabored. There is no jugulovenous distention. Chest is nicely healed. Cardiac exam reveals S1 and S2. There no clicks or significant murmurs. Abdomen is soft. There is no peripheral edema.  Filed Vitals:   01/06/12 1301  BP: 116/61  Pulse: 58  Resp: 18  Height: 5\' 11"  (1.803 m)  Weight: 211 lb (95.709 kg)     ASSESSMENT & PLAN

## 2012-01-06 NOTE — Assessment & Plan Note (Signed)
Patient continues to recover nicely from CABG. He can return to full activities with moderation.

## 2012-04-21 ENCOUNTER — Ambulatory Visit (INDEPENDENT_AMBULATORY_CARE_PROVIDER_SITE_OTHER): Payer: Medicare Other | Admitting: Physician Assistant

## 2012-04-21 ENCOUNTER — Encounter: Payer: Self-pay | Admitting: Physician Assistant

## 2012-04-21 VITALS — BP 130/74 | HR 72 | Ht 71.0 in | Wt 203.0 lb

## 2012-04-21 DIAGNOSIS — R079 Chest pain, unspecified: Secondary | ICD-10-CM

## 2012-04-21 DIAGNOSIS — I10 Essential (primary) hypertension: Secondary | ICD-10-CM

## 2012-04-21 DIAGNOSIS — I2510 Atherosclerotic heart disease of native coronary artery without angina pectoris: Secondary | ICD-10-CM

## 2012-04-21 NOTE — Assessment & Plan Note (Signed)
Stable status post CABG in December 2012. Recent chest pain seems to be musculoskeletal in origin.

## 2012-04-21 NOTE — Patient Instructions (Addendum)
Your physician recommends that you schedule a follow-up appointment in: In [place] with Dr Myrtis Ser in November or December  Call [facility] office if you experience any further chest pain  Limit upper body exertion

## 2012-04-21 NOTE — Assessment & Plan Note (Signed)
Patient's chest pain sounds to be musculoskeletal in origin because of all the heavy work he was doing with his shoulders over the past week. EKG is unchanged and he is feeling better. Have asked him to decrease the amount of heavy activity he is doing. He is to call us if he has any further chest pain.

## 2012-04-21 NOTE — Assessment & Plan Note (Signed)
stable °

## 2012-04-21 NOTE — Progress Notes (Signed)
HPI:   This is a very pleasant 78 year old married white male patient off her [place]illa Rough who has a history of coronary artery disease status post CABG in December 2012. He last saw Dr. Myrtis Ser in May 2013 at which time he was doing well.  The patient is referred to [place] today from Paulita Cradle, nurse practitioner at [facility] family [facility]. The patient was seen yesterday for chest pain. EKG was done that showed no acute change.  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 That evening he had chest pain described as a tightness that would come and go all night long. He says it did not feel like when he had his prior angina. He had no radiation, shortness of breath, dizziness, or presyncope. He did not use nitroglycerin. He says he was very sore in his shoulders as well. Since then he has felt fine without any further chest pain. 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   Allergies: -- Ketorolac Tromethamine -- Rash  Current Outpatient Prescriptions on File Prior to Visit: aspirin 81 MG tablet, Take 81 mg by mouth daily., Disp: , Rfl:  meclizine (ANTIVERT) 32 MG tablet, Take 32 mg by mouth 3 (three) times daily as needed. For dizziness, Disp: , Rfl:  metFORMIN (GLUCOPHAGE) 500 MG tablet, Take 1 tablet (500 mg total) by mouth 2 (two) times daily with a meal., Disp: 60 tablet, Rfl: 1 metoprolol tartrate (LOPRESSOR) 25 MG tablet, Take 0.5 tablets (12.5 mg total) by mouth 2 (two) times daily., Disp: 30 tablet, Rfl: 6 multivitamin-lutein (OCUVITE-LUTEIN) CAPS, Take 1 capsule by mouth daily.  , Disp: , Rfl:  rosuvastatin (CRESTOR) 10 MG tablet, Take 1 tablet (10 mg total) by mouth daily at 6 PM., Disp: 30 tablet, Rfl: 6 zolpidem  (AMBIEN) 10 MG tablet, Take 10 mg by mouth at bedtime as needed., Disp: , Rfl:     Past Medical History:   Dizziness                                                    Fatigue                                                        Comment:chronic   HTN (hypertension)                                           Vertigo                                                      AAA (abdominal aortic aneurysm)  Comment:Surgery Dr Arbie Cookey 2000. /  Ultrasound October,               2012, no significant abnormality, technically               difficult   Dyslipidemia                                                   Comment:Triglycerides elevated   CAD (coronary artery disease)                                  Comment:05/2011 Nuclear normal  /  chest pain December,              2012, CABG   Elevated glucose                                             Headache                                                       Comment:October, 2012   Ejection fraction                                              Comment:EF normal, nuclear, October, 2012   Prostate cancer                                                Comment:Dr.Wrenn; S/P radiation   Pneumonia                                       1940's       GERD (gastroesophageal reflux disease)                       Arthritis                                                    CVA (cerebral vascular accident)                               Comment:Old left frontal infarct by MRI 2008   Hx of CABG                                                     Comment:August 21, 2011,  Dr. Cornelius Moras, LIMA to distal               LAD, SVG acute marginal of RCA, SVG to diagonal   Thrombocytopenia                                               Comment:Bone marrow biopsy August 20, 2011   Carotid artery disease                                         Comment:Doppler, hospital, December, 2012, no               significant  carotid stenoses    Itching                                                        Comment:May, 2013  Past Surgical History:   ABDOMINAL AORTIC ANEURYSM REPAIR                ~ 2000       FEMORAL ARTERY ANEURYSM REPAIR                  ~ 2000       CHOLECYSTECTOMY                                              KNEE SURGERY                                    2009           Comment:left; repair   INGUINAL HERNIA REPAIR                                         Comment:X3?   CORONARY ARTERY BYPASS GRAFT                    08/21/2011     Comment:Procedure: CORONARY ARTERY BYPASS GRAFTING               (CABG);  Surgeon: Purcell Nails, MD;                Location: [facility] OR;  Service: Open Heart Surgery;               Laterality: N/A;  Coronary Artery Bypass graft               on pump times three utlizing the left internal               mammary artery and right greater saphenous vein              harvested endoscopically  Review of patient's family history indicates:   Heart attack  Mother                   Heart attack                   Father                   Heart attack                   Brother                  Prostate cancer                Brother                  Colon cancer                   Brother                  Social History   Marital Status: Married             Spouse Name:                      Years of Education:                 Number of children: 4           Occupational History Occupation          Associate Professor            Comment              retired                                   Social History Main Topics   Smoking Status: Former Smoker                   Packs/Day: 3     Years: 50        Types: Cigarettes     Quit date: 08/25/1998   Smokeless Status: Former Neurosurgeon                        Types: Chew   Comment: quit smoking cigarettes "2000"   Alcohol Use: Yes               Comment: 08/15/11 "last alcohol was too long ago                to count"   Drug Use: No              Sexual Activity: No                 Other Topics            Concern   None on file  Social History Narrative   None on file    ZOX:WRUEAVW shoulder pain secondary to arthritis, some pain in his breast since CABG, see history of present illness otherwise negative   PHYSICAL EXAM: Well-nournished, in no acute distress. Neck: No JVD, HJR, Bruit, or thyroid enlargement  Lungs: No tachypnea, clear without wheezing, rales, or rhonchi  Cardiovascular: RRR, PMI not displaced, positive S4 and 2/6 systolic murmur at the left sternal border, no bruit, thrill, or heave.  Abdomen: BS normal. Soft without organomegaly, masses, lesions or tenderness.  Extremities: without cyanosis, clubbing or edema. Good distal pulses bilateral  SKin: Warm, no lesions or rashes   Musculoskeletal: No deformities  Neuro: no focal signs  BP 130/74  Pulse 72  Ht 5\' 11"  (1.803 m)  Wt 203 lb (92.08 kg)  BMI 28.31 kg/m2   EKG:[facility] reviewed [facility] shows normal sinus rhythm with left amp. Fascicular block, nonspecific ST-T wave changes, no acute change from prior tracing

## 2012-04-28 ENCOUNTER — Ambulatory Visit: Payer: Medicare Other | Admitting: Physician Assistant

## 2012-04-30 ENCOUNTER — Encounter: Payer: Self-pay | Admitting: Cardiology

## 2012-06-29 ENCOUNTER — Other Ambulatory Visit: Payer: Self-pay | Admitting: Cardiology

## 2012-08-02 ENCOUNTER — Ambulatory Visit: Payer: Medicare Other | Admitting: Cardiology

## 2012-08-11 ENCOUNTER — Ambulatory Visit: Payer: Medicare Other | Admitting: Physician Assistant

## 2012-09-20 ENCOUNTER — Ambulatory Visit: Payer: Medicare Other | Admitting: Physician Assistant

## 2012-11-05 ENCOUNTER — Encounter: Payer: Self-pay | Admitting: Cardiology

## 2012-11-08 ENCOUNTER — Ambulatory Visit (INDEPENDENT_AMBULATORY_CARE_PROVIDER_SITE_OTHER): Payer: Medicare Other | Admitting: Cardiology

## 2012-11-08 ENCOUNTER — Encounter: Payer: Self-pay | Admitting: Cardiology

## 2012-11-08 VITALS — BP 108/55 | HR 52 | Ht 70.5 in | Wt 214.0 lb

## 2012-11-08 DIAGNOSIS — D696 Thrombocytopenia, unspecified: Secondary | ICD-10-CM

## 2012-11-08 DIAGNOSIS — R5383 Other fatigue: Secondary | ICD-10-CM

## 2012-11-08 DIAGNOSIS — R5381 Other malaise: Secondary | ICD-10-CM

## 2012-11-08 DIAGNOSIS — I10 Essential (primary) hypertension: Secondary | ICD-10-CM

## 2012-11-08 DIAGNOSIS — Z951 Presence of aortocoronary bypass graft: Secondary | ICD-10-CM

## 2012-11-08 DIAGNOSIS — I2510 Atherosclerotic heart disease of native coronary artery without angina pectoris: Secondary | ICD-10-CM

## 2012-11-08 DIAGNOSIS — R17 Unspecified jaundice: Secondary | ICD-10-CM

## 2012-11-08 NOTE — Assessment & Plan Note (Signed)
Coronary disease is stable post CABG. No further workup. 

## 2012-11-08 NOTE — Patient Instructions (Signed)
Continue all current medications. Your physician wants you to follow up in: 6 months.  You will receive a reminder letter in the mail one-two months in advance.  If you don't receive a letter, please call our office to schedule the follow up appointment   

## 2012-11-08 NOTE — Assessment & Plan Note (Signed)
He continues to complain of mild chronic fatigue. I doubt that this is cardiac in origin.

## 2012-11-08 NOTE — Assessment & Plan Note (Signed)
Blood pressure is controlled. No change in therapy. 

## 2012-11-08 NOTE — Progress Notes (Signed)
HPI  The patient is seen today to followup coronary artery disease. He's actually doing well. He was seen last in the office in August, 2013. At that time he was seen in the [place] office because of some chest discomfort. This was felt to be musculoskeletal. He is now stable.  As part of today's evaluation I have reviewed multiple sets of outside records. In addition at the patient and family request I have rereviewed hospital records from his surgery in December, 2012. There was question about his platelet count in the evaluation that he had.  Recent GI evaluation was done for elevated bilirubin. This was felt to be benign.  Concerning his history of thrombocytopenia the patient's platelet count was reduced during his hospitalization in December, 2012. He was actually seen in consultation by hematology. He actually had a bone marrow biopsy. He was allowed to have his heart surgery. The notes after his surgery say that his bone marrow head revealed normal megakaryocytes. Most probably his low platelet count was secondary to low grade ITP. This could be followed over time.  Allergies  Allergen Reactions  . Ketorolac Tromethamine Rash    Current Outpatient Prescriptions  Medication Sig Dispense Refill  . ALPRAZolam (XANAX) 0.5 MG tablet Take 0.5 mg by mouth 2 (two) times daily as needed.       [name] aspirin 81 MG tablet Take 81 mg by mouth daily.      [name] etodolac (LODINE) 400 MG tablet Take 400 mg by mouth 2 (two) times daily.      . meclizine (ANTIVERT) 32 MG tablet Take 32 mg by mouth 3 (three) times daily as needed. For dizziness      . metFORMIN (GLUCOPHAGE) 500 MG tablet Take 500 mg by mouth daily with breakfast.      . metoprolol tartrate (LOPRESSOR) 25 MG tablet TAKE ONE-HALF TABLET BY MOUTH TWICE DAILY  30 tablet  5  . multivitamin-lutein (OCUVITE-LUTEIN) CAPS Take 1 capsule by mouth daily.        . rosuvastatin (CRESTOR) 10 MG tablet Take 1 tablet (10 mg total) by mouth daily at 6  PM.  30 tablet  6  . zolpidem (AMBIEN) 10 MG tablet Take 10 mg by mouth at bedtime as needed.       No current facility-administered medications for this visit.    History   Social History  . Marital Status: Married    Spouse Name: N/A    Number of Children: 4  . Years of Education: N/A   Occupational History  . retired    Social History Main Topics  . Smoking status: Former Smoker -- 3.00 packs/day for 50 years    Types: Cigarettes    Quit date: 08/25/1998  . Smokeless tobacco: Former Neurosurgeon    Types: Chew     Comment: quit smoking cigarettes "2000"  . Alcohol Use: Yes     Comment: 08/15/11 "last alcohol was too long ago to count"  . Drug Use: No  . Sexually Active: No   Other Topics Concern  . Not on file   Social History Narrative  . No narrative on file    Family History  Problem Relation Age of Onset  . Heart attack Mother   . Heart attack Father   . Heart attack Brother   . Prostate cancer Brother   . Colon cancer Brother     Past Medical History  Diagnosis Date  . Dizziness   . Fatigue  chronic  . HTN (hypertension)   . Vertigo   . AAA (abdominal aortic aneurysm)     Surgery Dr Arbie Cookey 2000. /  Ultrasound October, 2012, no significant abnormality, technically difficult  . Dyslipidemia     Triglycerides elevated  . CAD (coronary artery disease)     05/2011 Nuclear normal  /  chest pain December, 2012, CABG  . Elevated glucose   . Headache     October, 2012  . Ejection fraction     EF normal, nuclear, October, 2012  . Prostate cancer     Dr.Wrenn; S/P radiation  . Pneumonia 1940's  . GERD (gastroesophageal reflux disease)   . Arthritis   . CVA (cerebral vascular accident)     Old left frontal infarct by MRI 2008  . Hx of CABG     August 21, 2011, Dr. Cornelius Moras, LIMA to distal LAD, SVG acute marginal of RCA, SVG to diagonal  . Thrombocytopenia     Bone marrow biopsy August 20, 2011  . Carotid artery disease     Doppler, hospital, December,  2012, no significant  carotid stenoses  . Itching     May, 2013  . Hyperbilirubinemia     January, 2014.Marland KitchenMarland KitchenDr Teena Dunk    Past Surgical History  Procedure Laterality Date  . Abdominal aortic aneurysm repair  ~ 2000  . Femoral artery aneurysm repair  ~ 2000  . Cholecystectomy    . Knee surgery  2009    left; repair  . Inguinal hernia repair      X3?  [name] Coronary artery bypass graft  08/21/2011    Procedure: CORONARY ARTERY BYPASS GRAFTING (CABG);  Surgeon: Purcell Nails, MD;  Location: [facility] OR;  Service: Open Heart Surgery;  Laterality: N/A;  Coronary Artery Bypass graft on pump times three utlizing the left internal mammary artery and right greater saphenous vein harvested endoscopically    Patient Active Problem List  Diagnosis  . Diabetes mellitus  . Dizziness  . Fatigue  . HTN (hypertension)  . CVA (cerebral vascular accident)  . Vertigo  . Prostate cancer  . Dyslipidemia  . Elevated glucose  . Headache  . Ejection fraction  . S/P AAA repair  . Bronchitis  . S/P CABG x 3  . Carotid artery disease  . Thrombocytopenia  . CAD (coronary artery disease)  . Itching  . Chest pain  . Hyperbilirubinemia    ROS   PHYSICAL EXAM  Filed Vitals:   11/08/12 1311  BP: 108/55  Pulse: 52  Height: 5' 10.5" (1.791 m)  Weight: 214 lb (97.07 kg)    EKG  Patient denies fever, chills, headache, sweats, rash, change in vision, change in hearing, chest pain, cough, nausea vomiting, urinary symptoms. All other systems are reviewed and are negative.  ASSESSMENT & PLAN

## 2012-11-08 NOTE — Assessment & Plan Note (Signed)
This issue was recently evaluated by GI. He may have mild Gilberts.  Today I spent greater than 25 minutes reviewing records and working with the patient. More than half of this time was spent with direct patient contact. In addition I spent other time reviewing records.

## 2012-11-08 NOTE — Assessment & Plan Note (Signed)
The patient is doing well post CABG. No further workup.

## 2012-11-08 NOTE — Assessment & Plan Note (Signed)
His most recent platelet count was in the range of 110,000. He is married and his rates between 110,000 and 160,000.No further workup over time. This will need to be followed.

## 2012-11-13 ENCOUNTER — Telehealth: Payer: Self-pay | Admitting: Family Medicine

## 2012-11-13 MED ORDER — GLUCOSE BLOOD VI STRP: ORAL_STRIP | Status: DC

## 2012-11-13 NOTE — Telephone Encounter (Signed)
rx for blood sugar strips faxed to [facility] Pt awaree

## 2012-11-13 NOTE — Telephone Encounter (Signed)
HE NEEDS A NEW PRESCRIPTION SENT TO [facility] FOR HIS GLUCOSE TESTING STRIPS. REALLY NEED IT SENT AS SOON AS POSSIBLE

## 2012-11-26 ENCOUNTER — Telehealth: Payer: Self-pay | Admitting: Family Medicine

## 2012-11-26 ENCOUNTER — Other Ambulatory Visit: Payer: Self-pay | Admitting: *Deleted

## 2012-11-26 MED ORDER — METFORMIN HCL 500 MG PO TABS: 500.0000 mg | ORAL_TABLET | Freq: Every day | ORAL | Status: DC

## 2012-11-26 NOTE — Telephone Encounter (Signed)
Filled this earlier but it went to [facility], resent to  [facility]

## 2012-11-26 NOTE — Telephone Encounter (Signed)
Metformin refilled. Pt aware

## 2012-11-29 ENCOUNTER — Other Ambulatory Visit: Payer: Self-pay | Admitting: *Deleted

## 2012-11-29 MED ORDER — ETODOLAC 400 MG PO TABS: 400.0000 mg | ORAL_TABLET | Freq: Two times a day (BID) | ORAL | Status: DC

## 2013-01-06 ENCOUNTER — Other Ambulatory Visit: Payer: Self-pay

## 2013-01-06 MED ORDER — METOPROLOL TARTRATE 25 MG PO TABS: 12.5000 mg | ORAL_TABLET | Freq: Two times a day (BID) | ORAL | Status: DC

## 2013-01-06 NOTE — Telephone Encounter (Signed)
metoprolol tartrate (LOPRESSOR) 25 MG tablet  TAKE ONE-HALF TABLET BY MOUTH TWICE DAILY    Patient Instructions   Continue all current medications. Your physician wants you to follow up in: 6 months.  You will receive a reminder letter in the mail one-two months in advance.  If you don't receive a letter, please call our office to schedule the follow up appointment     Previous Visit     Provider Department Encounter #  09/20/2012  1:00 PM Willa Rough, MD [facility] Maryruth Bun [id number]

## 2013-02-22 ENCOUNTER — Other Ambulatory Visit: Payer: Self-pay | Admitting: Family Medicine

## 2013-02-22 NOTE — Telephone Encounter (Signed)
Last glucose 10/27/12   [facility]

## 2013-02-28 ENCOUNTER — Ambulatory Visit: Payer: Medicare Other | Admitting: Family Medicine

## 2013-03-03 ENCOUNTER — Telehealth: Payer: Self-pay | Admitting: Family Medicine

## 2013-03-03 NOTE — Telephone Encounter (Signed)
Patient was unhappy because he came fasting for his appointment and had to wait 1.5 hrs.  He actually left before having labs or being seen.  He would like to change providers and is requesting Paulene Floor, [name].    Appt scheduled for next week for routine f/u.  Patient's wife aware.

## 2013-03-10 ENCOUNTER — Ambulatory Visit (INDEPENDENT_AMBULATORY_CARE_PROVIDER_SITE_OTHER): Payer: Medicare Other | Admitting: Nurse Practitioner

## 2013-03-10 ENCOUNTER — Encounter: Payer: Self-pay | Admitting: Nurse Practitioner

## 2013-03-10 VITALS — BP 154/67 | HR 57 | Temp 97.0°F | Ht 70.5 in | Wt 210.6 lb

## 2013-03-10 DIAGNOSIS — I10 Essential (primary) hypertension: Secondary | ICD-10-CM

## 2013-03-10 DIAGNOSIS — E785 Hyperlipidemia, unspecified: Secondary | ICD-10-CM

## 2013-03-10 DIAGNOSIS — E119 Type 2 diabetes mellitus without complications: Secondary | ICD-10-CM

## 2013-03-10 LAB — COMPLETE METABOLIC PANEL WITH GFR
ALT: 17 U/L (ref 0–53)
AST: 17 U/L (ref 0–37)
Albumin: 4.2 g/dL (ref 3.5–5.2)
Alkaline Phosphatase: 67 U/L (ref 39–117)
Calcium: 9.2 mg/dL (ref 8.4–10.5)
Chloride: 106 mEq/L (ref 96–112)
Creat: 0.92 mg/dL (ref 0.50–1.35)
Potassium: 4.4 mEq/L (ref 3.5–5.3)

## 2013-03-10 LAB — POCT GLYCOSYLATED HEMOGLOBIN (HGB A1C): Hemoglobin A1C: 5.7

## 2013-03-10 MED ORDER — BUDESONIDE-FORMOTEROL FUMARATE 160-4.5 MCG/ACT IN AERO: 2.0000 | INHALATION_SPRAY | Freq: Two times a day (BID) | RESPIRATORY_TRACT | Status: DC

## 2013-03-10 MED ORDER — METFORMIN HCL 500 MG PO TABS: 500.0000 mg | ORAL_TABLET | Freq: Every day | ORAL | Status: DC

## 2013-03-10 MED ORDER — METFORMIN HCL 500 MG PO TABS: 500.0000 mg | ORAL_TABLET | Freq: Two times a day (BID) | ORAL | Status: DC

## 2013-03-10 MED ORDER — METOPROLOL TARTRATE 25 MG PO TABS: 12.5000 mg | ORAL_TABLET | Freq: Two times a day (BID) | ORAL | Status: DC

## 2013-03-10 MED ORDER — ROSUVASTATIN CALCIUM 10 MG PO TABS: 10.0000 mg | ORAL_TABLET | Freq: Every day | ORAL | Status: DC

## 2013-03-10 NOTE — Progress Notes (Addendum)
  Subjective:    Patient ID: [name], male    DOB: 07/31/1933, 79 y.o.   MRN: [id number]  Diabetes He presents for his follow-up diabetic visit. He has type 2 diabetes mellitus. His disease course has been stable. There are no hypoglycemic associated symptoms. Pertinent negatives for diabetes include no chest pain, no foot ulcerations and no visual change. There are no hypoglycemic complications. Symptoms are stable. There are no diabetic complications. Risk factors for coronary artery disease include diabetes mellitus, dyslipidemia, male sex, hypertension and family history. Current diabetic treatment includes oral agent (monotherapy). He is compliant with treatment all of the time. His weight is stable. He is following a generally healthy diet. He participates in exercise intermittently. His home blood glucose trend is fluctuating minimally. His breakfast blood glucose is taken between 6-7 am. His breakfast blood glucose range is generally 130-140 mg/dl. An ACE inhibitor/angiotensin II receptor blocker is not being taken. He does not see a podiatrist.Eye exam is current.  Hyperlipidemia This is a chronic problem. The current episode started more than 1 year ago. The problem is controlled. Recent lipid tests were reviewed and are normal. Exacerbating diseases include diabetes. Associated symptoms include shortness of breath. Pertinent negatives include no chest pain. Current antihyperlipidemic treatment includes statins. The current treatment provides significant improvement of lipids. Risk factors for coronary artery disease include diabetes mellitus, dyslipidemia, family history, hypertension and male sex.  Hypertension This is a chronic problem. The current episode started more than 1 year ago. The problem has been waxing and waning since onset. The problem is controlled. Associated symptoms include shortness of breath. Pertinent negatives include no chest pain or palpitations. Risk factors for  coronary artery disease include diabetes mellitus, dyslipidemia, family history and male gender. Past treatments include beta blockers. The current treatment provides mild improvement. There is no history of a thyroid problem.      Review of Systems  Respiratory: Positive for shortness of breath.   Cardiovascular: Negative for chest pain and palpitations.  All other systems reviewed and are negative.       Objective:   Physical Exam  Constitutional: He is oriented to person, place, and time. He appears well-developed and well-nourished.  HENT:  Head: Normocephalic.  Right Ear: External ear normal.  Left Ear: External ear normal.  Eyes: Pupils are equal, round, and reactive to light.  Neck: Normal range of motion. Neck supple.  Cardiovascular: Normal rate, regular rhythm, normal heart sounds and intact distal pulses.   Pulmonary/Chest: Effort normal and breath sounds normal.  Abdominal: Soft. Bowel sounds are normal. There is no tenderness.  Musculoskeletal: Normal range of motion. He exhibits no edema.  Neurological: He is alert and oriented to person, place, and time.  Skin: Skin is warm and dry.  Psychiatric: He has a normal mood and affect. His behavior is normal. Judgment and thought content normal.    BP 154/67  Pulse 57  Temp(Src) 97 F (36.1 C) (Oral)  Ht 5' 10.5" (1.791 m)  Wt 210 lb 9.6 oz (95.528 kg)  BMI 29.78 kg/m2  Results for orders placed in visit on 03/10/13  POCT GLYCOSYLATED HEMOGLOBIN (HGB A1C)      Result Value Range   Hemoglobin A1C 5.7%          Assessment & Plan:

## 2013-03-10 NOTE — Addendum Note (Signed)
Addended by: Bennie Pierini on: 03/10/2013 09:07 AM   Modules accepted: Orders

## 2013-03-10 NOTE — Patient Instructions (Addendum)

## 2013-03-11 LAB — NMR LIPOPROFILE WITH LIPIDS
Cholesterol, Total: 104 mg/dL (ref <200)
HDL Particle Number: 26.5 umol/L — ABNORMAL LOW (ref >=30.5)
LDL (calc): 36 mg/dL (ref <100)
LDL Particle Number: 435 nmol/L (ref <1000)
LP-IR Score: 52 — ABNORMAL HIGH (ref <=45)
Small LDL Particle Number: 339 nmol/L (ref <=527)
Triglycerides: 202 mg/dL — ABNORMAL HIGH (ref <150)
VLDL Size: 44.2 nm (ref <=46.6)

## 2013-04-21 ENCOUNTER — Telehealth: Payer: Self-pay | Admitting: Nurse Practitioner

## 2013-04-21 ENCOUNTER — Ambulatory Visit (INDEPENDENT_AMBULATORY_CARE_PROVIDER_SITE_OTHER): Payer: Medicare Other | Admitting: Nurse Practitioner

## 2013-04-21 ENCOUNTER — Encounter: Payer: Self-pay | Admitting: Nurse Practitioner

## 2013-04-21 VITALS — BP 125/62 | HR 56 | Temp 97.3°F | Ht 70.5 in | Wt 213.0 lb

## 2013-04-21 DIAGNOSIS — R42 Dizziness and giddiness: Secondary | ICD-10-CM

## 2013-04-21 DIAGNOSIS — R5381 Other malaise: Secondary | ICD-10-CM

## 2013-04-21 NOTE — Progress Notes (Signed)
  Subjective:    Patient ID: [name], male    DOB: Jul 20, 1933, 79 y.o.   MRN: [id number]  HPI Pt here with c/o of dizziness. He states yesterday when he woke up and felt dizzy all day and "hardly could get out of bed". Pt states he took three  Antiverts yesterday with some relief. No dizziness today  Pt states that for the last month is has been feeling fatigued. States I can only work for 3 to 4 hours and then "I'm wiped out".  States he is constantly working, but does not "seem to have any energy".    Review of Systems  Constitutional: Positive for fatigue.  Musculoskeletal: Positive for joint swelling.       Bilateral knee pain- has hx of a partial left knee replacement  Neurological: Positive for dizziness.  All other systems reviewed and are negative.       Objective:   Physical Exam  Vitals reviewed. Constitutional: He is oriented to person, place, and time. He appears well-developed and well-nourished.  HENT:  Head: Normocephalic.  Right Ear: External ear normal.  Left Ear: External ear normal.  Mouth/Throat: Oropharynx is clear and moist.  Eyes: Pupils are equal, round, and reactive to light.  Neck: Normal range of motion. Neck supple. No thyromegaly present.  Cardiovascular: Normal rate, regular rhythm, normal heart sounds and intact distal pulses.   Pulmonary/Chest: Effort normal and breath sounds normal.  Abdominal: Soft. Bowel sounds are normal. He exhibits no distension. There is no tenderness.  Musculoskeletal: Normal range of motion. He exhibits no edema.  Neurological: He is alert and oriented to person, place, and time. He has normal reflexes.  Skin: Skin is warm and dry.  Psychiatric: He has a normal mood and affect. His behavior is normal. Judgment and thought content normal.     BP 125/62  Pulse 56  Temp(Src) 97.3 F (36.3 C) (Oral)  Ht 5' 10.5" (1.791 m)  Wt 213 lb (96.616 kg)  BMI 30.12 kg/m2 EKG- first degree heart block-[name] Daphine Deutscher,  [name]      Assessment & Plan:  1. Dizziness Take antivert as needed - EKG 12-Lead - Orthostatic vital signs  2. Other malaise and fatigue Labs pending If get tired rest!!! - Anemia Profile B - CMP14+EGFR - Thyroid Panel With TSH  [name] Daphine Deutscher, [name]

## 2013-04-21 NOTE — Patient Instructions (Addendum)

## 2013-04-21 NOTE — Telephone Encounter (Signed)
APPT MADE

## 2013-04-22 LAB — ANEMIA PROFILE B
Basophils Absolute: 0 10*3/uL (ref 0.0–0.2)
Ferritin: 77 ng/mL (ref 30–400)
Folate: >19.9 ng/mL (ref >3.0)
Immature Grans (Abs): 0 10*3/uL (ref 0.0–0.1)
Immature Granulocytes: 0 % (ref 0–2)
Iron Saturation: 36 % (ref 15–55)
Lymphocytes Absolute: 1.6 10*3/uL (ref 0.7–3.1)
Lymphs: 23 % (ref 14–46)
Monocytes: 10 % (ref 4–12)
Neutrophils Relative %: 63 % (ref 40–74)
Platelets: 118 10*3/uL — ABNORMAL LOW (ref 150–379)
RDW: 14.9 % (ref 12.3–15.4)
Retic Ct Pct: 1.5 % (ref 0.6–2.6)
WBC: 7 10*3/uL (ref 3.4–10.8)

## 2013-04-22 LAB — CMP14+EGFR
ALT: 12 IU/L (ref 0–44)
Chloride: 103 mmol/L (ref 97–108)
GFR calc Af Amer: 93 mL/min/{1.73_m2} (ref >59)
Glucose: 81 mg/dL (ref 65–99)
Potassium: 4.4 mmol/L (ref 3.5–5.2)
Total Bilirubin: 1.1 mg/dL (ref 0.0–1.2)
Total Protein: 6.7 g/dL (ref 6.0–8.5)

## 2013-04-22 LAB — THYROID PANEL WITH TSH
Free Thyroxine Index: 2 (ref 1.2–4.9)
TSH: 1.91 u[IU]/mL (ref 0.450–4.500)

## 2013-05-17 ENCOUNTER — Encounter: Payer: Self-pay | Admitting: Cardiology

## 2013-05-17 ENCOUNTER — Ambulatory Visit (INDEPENDENT_AMBULATORY_CARE_PROVIDER_SITE_OTHER): Payer: Medicare Other | Admitting: Cardiology

## 2013-05-17 VITALS — BP 117/58 | HR 48 | Ht 70.0 in | Wt 217.0 lb

## 2013-05-17 DIAGNOSIS — I498 Other specified cardiac arrhythmias: Secondary | ICD-10-CM

## 2013-05-17 DIAGNOSIS — Z951 Presence of aortocoronary bypass graft: Secondary | ICD-10-CM

## 2013-05-17 DIAGNOSIS — R42 Dizziness and giddiness: Secondary | ICD-10-CM

## 2013-05-17 DIAGNOSIS — R001 Bradycardia, unspecified: Secondary | ICD-10-CM | POA: Insufficient documentation

## 2013-05-17 NOTE — Assessment & Plan Note (Signed)
His overall cardiac status is stable. No further workup.

## 2013-05-17 NOTE — Patient Instructions (Addendum)
Your physician recommends that you schedule a follow-up appointment in: 10 weeks. You will be contacted about this appoint in October 2014. If you don't receive a call or letter from our office, please contact us about scheduling this appointment. Your physician has recommended you make the following change in your medication: STOP METOPROLOL (LOPRESSOR). All other medications will remain the same.

## 2013-05-17 NOTE — Assessment & Plan Note (Signed)
He has borderline sinus bradycardia. He is on a very low dose of beta blocker. This will be stopped. Hold and see him back to followup and make sure that he is not having any major bradycardia arrhythmias.

## 2013-05-17 NOTE — Assessment & Plan Note (Signed)
He has mild intermittent vertigo. I am not convinced that his symptoms represent a significant bradycardia arrhythmia.

## 2013-05-17 NOTE — Progress Notes (Signed)
HPI  Patient is seen to followup coronary disease. He's doing well. He underwent CABG in 2012. Recently he's had some mild weakness. This question of very limited presyncope. He has had some bradycardia. In general he's doing well and he really is quite active.  Allergies  Allergen Reactions  . Erythromycin   . Penicillins   . Ketorolac Tromethamine Rash    Current Outpatient Prescriptions  Medication Sig Dispense Refill  . aspirin 81 MG tablet Take 81 mg by mouth daily.      . budesonide-formoterol (SYMBICORT) 160-4.5 MCG/ACT inhaler Inhale 2 puffs into the lungs 2 (two) times daily.  1 Inhaler  5  . glucose blood (BAYER CONTOUR TEST) test strip Use as instructed  100 each  5  . meclizine (ANTIVERT) 32 MG tablet Take 32 mg by mouth 3 (three) times daily as needed. For dizziness      . metFORMIN (GLUCOPHAGE) 500 MG tablet Take 1 tablet (500 mg total) by mouth 2 (two) times daily with a meal.  60 tablet  5  . metoprolol tartrate (LOPRESSOR) 25 MG tablet Take 0.5 tablets (12.5 mg total) by mouth 2 (two) times daily.  30 tablet  5  . rosuvastatin (CRESTOR) 10 MG tablet Take 1 tablet (10 mg total) by mouth daily at 6 PM.  30 tablet  6   No current facility-administered medications for this visit.    History   Social History  . Marital Status: Married    Spouse Name: N/A    Number of Children: 4  . Years of Education: N/A   Occupational History  . retired    Social History Main Topics  . Smoking status: Former Smoker -- 3.00 packs/day for 50 years    Types: Cigarettes    Quit date: 08/25/1998  . Smokeless tobacco: Former Neurosurgeon    Types: Chew     Comment: quit smoking cigarettes "2000"  . Alcohol Use: No     Comment: 08/15/11 "last alcohol was too long ago to count"  . Drug Use: No  . Sexual Activity: No   Other Topics Concern  . Not on file   Social History Narrative  . No narrative on file    Family History  Problem Relation Age of Onset  . Heart attack Mother     . Heart attack Father   . Heart attack Brother   . Prostate cancer Brother   . Colon cancer Brother     Past Medical History  Diagnosis Date  . Dizziness   . Fatigue     chronic  . HTN (hypertension)   . Vertigo   . AAA (abdominal aortic aneurysm)     Surgery Dr Arbie Cookey 2000. /  Ultrasound October, 2012, no significant abnormality, technically difficult  . Dyslipidemia     Triglycerides elevated  . CAD (coronary artery disease)     05/2011 Nuclear normal  /  chest pain December, 2012, CABG  . Elevated glucose   . ZOXWRUEA(540.9)     October, 2012  . Ejection fraction     EF normal, nuclear, October, 2012  . Prostate cancer     Dr.Wrenn; S/P radiation  . Pneumonia 1940's  . GERD (gastroesophageal reflux disease)   . Arthritis   . CVA (cerebral vascular accident)     Old left frontal infarct by MRI 2008  . Hx of CABG     August 21, 2011, Dr. Cornelius Moras, LIMA to distal LAD, SVG acute marginal of RCA, SVG to  diagonal  . Thrombocytopenia     Bone marrow biopsy August 20, 2011  . Carotid artery disease     Doppler, hospital, December, 2012, no significant  carotid stenoses  . Itching     May, 2013  . Hyperbilirubinemia     January, 2014.Marland KitchenMarland KitchenDr Teena Dunk    Past Surgical History  Procedure Laterality Date  . Abdominal aortic aneurysm repair  ~ 2000  . Femoral artery aneurysm repair  ~ 2000  . Cholecystectomy    . Knee surgery  2009    left; repair  . Inguinal hernia repair      X3?  [name] Coronary artery bypass graft  08/21/2011    Procedure: CORONARY ARTERY BYPASS GRAFTING (CABG);  Surgeon: Purcell Nails, MD;  Location: [facility] OR;  Service: Open Heart Surgery;  Laterality: N/A;  Coronary Artery Bypass graft on pump times three utlizing the left internal mammary artery and right greater saphenous vein harvested endoscopically    Patient Active Problem List   Diagnosis Date Noted  . S/P CABG x 3 08/21/2011    Priority: High  . Hyperbilirubinemia 11/05/2012  . Chest pain  04/21/2012  . Itching   . CAD (coronary artery disease)   . Carotid artery disease   . Thrombocytopenia   . Bronchitis 08/15/2011  . Ejection fraction   . S/P AAA repair   . Dizziness   . Fatigue   . HTN (hypertension)   . CVA (cerebral vascular accident)   . Vertigo   . Prostate cancer   . Dyslipidemia   . Elevated glucose   . Headache   . Diabetes mellitus 11/28/2010    ROS   Patient denies fever, chills, headache, sweats, rash, change in vision, change in hearing, chest pain, cough, nausea vomiting, urinary symptoms. All other systems are reviewed and are negative.  PHYSICAL EXAM  Patient is stable. He's here with his wife and his daughter. He is oriented to person time and place. Affect is normal. He is overweight. There is no jugulovenous distention. Lungs are clear. Respiratory effort is nonlabored. Cardiac exam her vitals S1 and S2. There no clicks or significant murmurs. Abdomen is soft. There is no peripheral edema.  Filed Vitals:   05/17/13 1348  BP: 117/58  Pulse: 48  Height: 5\' 10"  (1.778 m)  Weight: 217 lb (98.431 kg)   I have reviewed the EKG done at his primary care office dated April 21, 2013. He had sinus rhythm with a rate of 65. There are mild nonspecific ST-T wave changes. There is one PAC noted. There is only a 12-lead available to me.  ASSESSMENT & PLAN

## 2013-05-23 DIAGNOSIS — Z029 Encounter for administrative examinations, unspecified: Secondary | ICD-10-CM

## 2013-06-13 ENCOUNTER — Ambulatory Visit: Payer: Medicare Other | Admitting: Nurse Practitioner

## 2013-06-15 ENCOUNTER — Encounter: Payer: Self-pay | Admitting: *Deleted

## 2013-07-08 ENCOUNTER — Encounter: Payer: Self-pay | Admitting: Nurse Practitioner

## 2013-07-08 ENCOUNTER — Ambulatory Visit (INDEPENDENT_AMBULATORY_CARE_PROVIDER_SITE_OTHER): Payer: Medicare Other | Admitting: Nurse Practitioner

## 2013-07-08 VITALS — BP 143/77 | HR 57 | Temp 96.7°F | Ht 70.0 in | Wt 215.0 lb

## 2013-07-08 DIAGNOSIS — I10 Essential (primary) hypertension: Secondary | ICD-10-CM

## 2013-07-08 DIAGNOSIS — Z23 Encounter for immunization: Secondary | ICD-10-CM

## 2013-07-08 DIAGNOSIS — I498 Other specified cardiac arrhythmias: Secondary | ICD-10-CM

## 2013-07-08 DIAGNOSIS — D696 Thrombocytopenia, unspecified: Secondary | ICD-10-CM

## 2013-07-08 DIAGNOSIS — I2510 Atherosclerotic heart disease of native coronary artery without angina pectoris: Secondary | ICD-10-CM

## 2013-07-08 DIAGNOSIS — E119 Type 2 diabetes mellitus without complications: Secondary | ICD-10-CM

## 2013-07-08 DIAGNOSIS — R001 Bradycardia, unspecified: Secondary | ICD-10-CM

## 2013-07-08 DIAGNOSIS — E785 Hyperlipidemia, unspecified: Secondary | ICD-10-CM

## 2013-07-08 LAB — POCT GLYCOSYLATED HEMOGLOBIN (HGB A1C): Hemoglobin A1C: 5.3

## 2013-07-08 NOTE — Patient Instructions (Signed)

## 2013-07-08 NOTE — Progress Notes (Signed)
Subjective:    Patient ID: [name], male    DOB: 03/23/1933, 79 y.o.   MRN: [id number]  Diabetes He presents for his follow-up diabetic visit. He has type 2 diabetes mellitus. His disease course has been stable. There are no hypoglycemic associated symptoms. Pertinent negatives for diabetes include no chest pain, no foot ulcerations and no visual change. There are no hypoglycemic complications. Symptoms are stable. There are no diabetic complications. Risk factors for coronary artery disease include diabetes mellitus, dyslipidemia, male sex, hypertension and family history. Current diabetic treatment includes oral agent (monotherapy). He is compliant with treatment all of the time. His weight is stable. He is following a generally healthy diet. He participates in exercise intermittently. His home blood glucose trend is fluctuating minimally. His breakfast blood glucose is taken between 6-7 am. His breakfast blood glucose range is generally 110-130 mg/dl. His highest blood glucose is 180-200 mg/dl. His overall blood glucose range is 130-140 mg/dl. An ACE inhibitor/angiotensin II receptor blocker is not being taken. He does not see a podiatrist.Eye exam is current.  Hyperlipidemia This is a chronic problem. The current episode started more than 1 year ago. The problem is controlled. Recent lipid tests were reviewed and are normal. Exacerbating diseases include diabetes. Associated symptoms include shortness of breath. Pertinent negatives include no chest pain. Current antihyperlipidemic treatment includes statins. The current treatment provides significant improvement of lipids. Risk factors for coronary artery disease include diabetes mellitus, dyslipidemia, family history, hypertension and male sex.  Hypertension This is a chronic problem. The current episode started more than 1 year ago. The problem has been waxing and waning since onset. The problem is controlled. Associated symptoms include  shortness of breath. Pertinent negatives include no chest pain or palpitations. Risk factors for coronary artery disease include diabetes mellitus, dyslipidemia, family history and male gender. Past treatments include beta blockers. The current treatment provides mild improvement. There is no history of a thyroid problem.      Review of Systems  Respiratory: Positive for shortness of breath.   Cardiovascular: Negative for chest pain and palpitations.  All other systems reviewed and are negative.       Objective:   Physical Exam  Constitutional: He is oriented to person, place, and time. He appears well-developed and well-nourished.  HENT:  Head: Normocephalic.  Right Ear: External ear normal.  Left Ear: External ear normal.  Eyes: Pupils are equal, round, and reactive to light.  Neck: Normal range of motion. Neck supple.  Cardiovascular: Normal rate, regular rhythm, normal heart sounds and intact distal pulses.   Pulmonary/Chest: Effort normal and breath sounds normal.  Abdominal: Soft. Bowel sounds are normal. There is no tenderness.  Musculoskeletal: Normal range of motion. He exhibits no edema.  Neurological: He is alert and oriented to person, place, and time.  Skin: Skin is warm and dry.  Psychiatric: He has a normal mood and affect. His behavior is normal. Judgment and thought content normal.    BP 143/77  Pulse 57  Temp(Src) 96.7 F (35.9 C) (Oral)  Ht 5\' 10"  (1.778 m)  Wt 215 lb (97.523 kg)  BMI 30.85 kg/m2        Assessment & Plan:   1. Diabetes   2. HTN (hypertension)   3. Thrombocytopenia   4. Sinus bradycardia   5. Dyslipidemia   6. CAD (coronary artery disease)    Orders Placed This Encounter  Procedures  . CMP14+EGFR  . NMR, lipoprofile  . POCT glycosylated hemoglobin (Hb  A1C)     Continue all meds Labs pending Diet and exercise encouraged Health maintenance reviewed Follow up in 3 months  [name] Daphine Deutscher, [name]

## 2013-07-08 NOTE — Addendum Note (Signed)
Addended by: Azalee Course on: 07/08/2013 04:32 PM   Modules accepted: Orders

## 2013-07-10 LAB — CMP14+EGFR
ALT: 17 IU/L (ref 0–44)
Albumin: 4.3 g/dL (ref 3.5–4.8)
CO2: 29 mmol/L (ref 18–29)
Calcium: 9.6 mg/dL (ref 8.6–10.2)
Chloride: 103 mmol/L (ref 97–108)
GFR calc non Af Amer: 82 mL/min/{1.73_m2} (ref >59)
Glucose: 101 mg/dL — ABNORMAL HIGH (ref 65–99)
Potassium: 4.5 mmol/L (ref 3.5–5.2)
Total Protein: 6.4 g/dL (ref 6.0–8.5)

## 2013-07-10 LAB — NMR, LIPOPROFILE
Cholesterol: 102 mg/dL (ref <200)
HDL Cholesterol by NMR: 29 mg/dL — ABNORMAL LOW (ref >=40)
LDL Particle Number: 499 nmol/L (ref <1000)
Triglycerides by NMR: 264 mg/dL — ABNORMAL HIGH (ref <150)

## 2013-07-20 ENCOUNTER — Ambulatory Visit (INDEPENDENT_AMBULATORY_CARE_PROVIDER_SITE_OTHER): Payer: Medicare Other | Admitting: Family Medicine

## 2013-07-20 ENCOUNTER — Ambulatory Visit (INDEPENDENT_AMBULATORY_CARE_PROVIDER_SITE_OTHER): Payer: Medicare Other

## 2013-07-20 VITALS — BP 137/65 | HR 60 | Temp 97.0°F | Ht 70.0 in | Wt 213.0 lb

## 2013-07-20 DIAGNOSIS — R52 Pain, unspecified: Secondary | ICD-10-CM

## 2013-07-20 DIAGNOSIS — R109 Unspecified abdominal pain: Secondary | ICD-10-CM

## 2013-07-20 DIAGNOSIS — K439 Ventral hernia without obstruction or gangrene: Secondary | ICD-10-CM

## 2013-07-20 DIAGNOSIS — R197 Diarrhea, unspecified: Secondary | ICD-10-CM

## 2013-07-20 LAB — POCT UA - MICROSCOPIC ONLY
Casts, Ur, LPF, POC: NEGATIVE
Crystals, Ur, HPF, POC: NEGATIVE
Epithelial cells, urine per micros: NEGATIVE
Mucus, UA: NEGATIVE
WBC, Ur, HPF, POC: NEGATIVE

## 2013-07-20 LAB — POCT URINALYSIS DIPSTICK
Bilirubin, UA: NEGATIVE
Glucose, UA: NEGATIVE
Leukocytes, UA: NEGATIVE
Nitrite, UA: NEGATIVE
Protein, UA: NEGATIVE
Spec Grav, UA: 1.015
Urobilinogen, UA: NEGATIVE
pH, UA: 8

## 2013-07-20 NOTE — Progress Notes (Signed)
   Subjective:    Patient ID: [name], male    DOB: Dec 22, 1932, 79 y.o.   MRN: [id number]  HPI This 79 y.o. male presents for evaluation of left flank pain and discomfort that  Woke him up at 0400 am today.  He is concerned it may be a kidney stone. He has been having some diarrhea he states ever since he has had radiation For prostate cancer.  He has some questions concerning his abdominal hernia and Having abdominal bulging.  He has had CABG and s/p exp lap surgery in past and Has talked to a surgeon concerning ventral hernia.  He states the hernia is bulging more.   Review of Systems C/o left flank pain, abdominal hernia, and diarrhea. No chest pain, SOB, HA, dizziness, vision change, N/V, diarrhea, constipation, dysuria, urinary urgency or frequency, myalgias, arthralgias or rash.     Objective:   Physical Exam Vital signs noted  Well developed well nourished male.  HEENT - Head atraumatic Normocephalic                Eyes - PERRLA, Conjuctiva - clear Sclera- Clear EOMI Respiratory - Lungs CTA bilateral Cardiac - RRR S1 and S2 without murmur GI - Abdomen soft Nontender, obese and bowel sounds active x 4, He has diastasis and ventral hernia that reduces. Extremities - No edema. Neuro - Grossly intact.  Xray - KUB - Constipation and no kidney stone.  Results for orders placed in visit on 07/20/13  POCT UA - MICROSCOPIC ONLY      Result Value Range   WBC, Ur, HPF, POC neg     RBC, urine, microscopic occ     Bacteria, U Microscopic occ     Mucus, UA neg     Epithelial cells, urine per micros neg     Crystals, Ur, HPF, POC neg     Casts, Ur, LPF, POC neg    POCT URINALYSIS DIPSTICK      Result Value Range   Color, UA yellow     Clarity, UA clear     Glucose, UA neg     Bilirubin, UA neg     Ketones, UA mod     Spec Grav, UA 1.015     Blood, UA trace     pH, UA 8.0     Protein, UA neg     Urobilinogen, UA negative     Nitrite, UA neg     Leukocytes, UA  Negative         Assessment & Plan:  Acute left flank pain - Plan: DG Abd 1 View, POCT UA - Microscopic Only, POCT urinalysis dipstick. Discussed with patient that I believe his constipation is causing him to have flank pain.  Discussed UA results and trace of blood and he could have a small stone that is not  Seen on the xray so he should Push po fluids.   Recommend mag citrate for constipation causing flank pain.  Ventral hernia - Recommend he wear abdominal binder at home and if the small hernia worsens or will not Reduce then follow up.  Diarrhea - He has been having IBS problems after his radiation and have recommended he take align probiotics Otc.    Deatra Canter [name]

## 2013-07-26 ENCOUNTER — Ambulatory Visit (INDEPENDENT_AMBULATORY_CARE_PROVIDER_SITE_OTHER): Payer: Medicare Other | Admitting: Cardiology

## 2013-07-26 ENCOUNTER — Encounter: Payer: Self-pay | Admitting: Cardiology

## 2013-07-26 VITALS — BP 140/64 | HR 69 | Ht 70.0 in | Wt 212.0 lb

## 2013-07-26 DIAGNOSIS — I2510 Atherosclerotic heart disease of native coronary artery without angina pectoris: Secondary | ICD-10-CM

## 2013-07-26 DIAGNOSIS — R42 Dizziness and giddiness: Secondary | ICD-10-CM

## 2013-07-26 DIAGNOSIS — Z951 Presence of aortocoronary bypass graft: Secondary | ICD-10-CM

## 2013-07-26 NOTE — Progress Notes (Signed)
HPI  Patient is seen today to followup his coronary disease. I had seen him last in September, 2014. He underwent CABG in 2012. He had some weakness recently and possibly some mild borderline pre-syncope. He had bradycardia. I stopped his beta blocker completely. He's had some fatigue but he has not had any recurring spells.  Allergies  Allergen Reactions  . Erythromycin   . Penicillins   . Ketorolac Tromethamine Rash    Current Outpatient Prescriptions  Medication Sig Dispense Refill  . aspirin 81 MG tablet Take 81 mg by mouth daily.      . budesonide-formoterol (SYMBICORT) 160-4.5 MCG/ACT inhaler Inhale 2 puffs into the lungs 2 (two) times daily.  1 Inhaler  5  . glucose blood (BAYER CONTOUR TEST) test strip Use as instructed  100 each  5  . meclizine (ANTIVERT) 32 MG tablet Take 32 mg by mouth 3 (three) times daily as needed. For dizziness      . metFORMIN (GLUCOPHAGE) 500 MG tablet Take 1 tablet (500 mg total) by mouth 2 (two) times daily with a meal.  60 tablet  5  . rosuvastatin (CRESTOR) 10 MG tablet Take 1 tablet (10 mg total) by mouth daily at 6 PM.  30 tablet  6   No current facility-administered medications for this visit.    History   Social History  . Marital Status: Married    Spouse Name: N/A    Number of Children: 4  . Years of Education: N/A   Occupational History  . retired    Social History Main Topics  . Smoking status: Former Smoker -- 3.00 packs/day for 50 years    Types: Cigarettes    Quit date: 08/25/1998  . Smokeless tobacco: Former Neurosurgeon    Types: Chew     Comment: quit smoking cigarettes "2000"  . Alcohol Use: No     Comment: 08/15/11 "last alcohol was too long ago to count"  . Drug Use: No  . Sexual Activity: No   Other Topics Concern  . Not on file   Social History Narrative  . No narrative on file    Family History  Problem Relation Age of Onset  . Heart attack Mother   . Heart attack Father   . Heart attack Brother   .  Prostate cancer Brother   . Colon cancer Brother     Past Medical History  Diagnosis Date  . Dizziness   . Fatigue     chronic  . HTN (hypertension)   . Vertigo   . AAA (abdominal aortic aneurysm)     Surgery Dr Arbie Cookey 2000. /  Ultrasound October, 2012, no significant abnormality, technically difficult  . Dyslipidemia     Triglycerides elevated  . CAD (coronary artery disease)     05/2011 Nuclear normal  /  chest pain December, 2012, CABG  . Elevated glucose   . XBJYNWGN(562.1)     October, 2012  . Ejection fraction     EF normal, nuclear, October, 2012  . Prostate cancer     Dr.Wrenn; S/P radiation  . Pneumonia 1940's  . GERD (gastroesophageal reflux disease)   . Arthritis   . CVA (cerebral vascular accident)     Old left frontal infarct by MRI 2008  . Hx of CABG     August 21, 2011, Dr. Cornelius Moras, LIMA to distal LAD, SVG acute marginal of RCA, SVG to diagonal  . Thrombocytopenia     Bone marrow biopsy August 20, 2011  .  Carotid artery disease     Doppler, hospital, December, 2012, no significant  carotid stenoses  . Itching     May, 2013  . Hyperbilirubinemia     January, 2014.Marland KitchenMarland KitchenDr Teena Dunk    Past Surgical History  Procedure Laterality Date  . Abdominal aortic aneurysm repair  ~ 2000  . Femoral artery aneurysm repair  ~ 2000  . Cholecystectomy    . Knee surgery  2009    left; repair  . Inguinal hernia repair      X3?  [name] Coronary artery bypass graft  08/21/2011    Procedure: CORONARY ARTERY BYPASS GRAFTING (CABG);  Surgeon: Purcell Nails, MD;  [facility];  Service: Open Heart Surgery;  Laterality: N/A;  Coronary Artery Bypass graft on pump times three utlizing the left internal mammary artery and right greater saphenous vein harvested endoscopically    Patient Active Problem List   Diagnosis Date Noted  . S/P CABG x 3 08/21/2011    Priority: High  . Sinus bradycardia 05/17/2013  . Hyperbilirubinemia 11/05/2012  . Chest pain 04/21/2012  . Itching   .  CAD (coronary artery disease)   . Carotid artery disease   . Thrombocytopenia   . Bronchitis 08/15/2011  . Ejection fraction   . S/P AAA repair   . Dizziness   . Fatigue   . HTN (hypertension)   . CVA (cerebral vascular accident)   . Vertigo   . Prostate cancer   . Dyslipidemia   . Elevated glucose   . Headache   . Diabetes mellitus 11/28/2010    ROS   Patient denies fever, chills, headache, sweats, rash, change in vision, change in hearing, chest pain, cough, nausea vomiting, urinary symptoms. All other systems are reviewed and are negative.  PHYSICAL EXAM  Patient is oriented to person time and place. He's here with family. Affect is normal. He looks good. There is no jugulovenous distention. Lungs are clear. Respiratory effort is nonlabored. Cardiac exam reveals S1 and S2. Abdomen is soft. There is no peripheral edema.  Filed Vitals:   07/26/13 1443  BP: 140/64  Pulse: 69  Height: 5\' 10"  (1.778 m)  Weight: 212 lb (96.163 kg)     ASSESSMENT & PLAN

## 2013-07-26 NOTE — Patient Instructions (Signed)

## 2013-07-26 NOTE — Assessment & Plan Note (Signed)
His beta blocker has been stopped and he is not having any further significant dizziness or significant bradycardia.

## 2013-07-26 NOTE — Assessment & Plan Note (Signed)
Coronary disease is stable. No change in therapy. 

## 2013-10-15 ENCOUNTER — Other Ambulatory Visit: Payer: Self-pay | Admitting: Nurse Practitioner

## 2013-11-09 ENCOUNTER — Ambulatory Visit (INDEPENDENT_AMBULATORY_CARE_PROVIDER_SITE_OTHER): Payer: Medicare HMO | Admitting: General Practice

## 2013-11-09 ENCOUNTER — Encounter: Payer: Self-pay | Admitting: General Practice

## 2013-11-09 ENCOUNTER — Telehealth: Payer: Self-pay | Admitting: Nurse Practitioner

## 2013-11-09 VITALS — BP 149/71 | HR 63 | Temp 96.6°F | Ht 70.0 in | Wt 222.2 lb

## 2013-11-09 DIAGNOSIS — R0602 Shortness of breath: Secondary | ICD-10-CM

## 2013-11-09 DIAGNOSIS — R062 Wheezing: Secondary | ICD-10-CM

## 2013-11-09 MED ORDER — ALBUTEROL SULFATE HFA 108 (90 BASE) MCG/ACT IN AERS: 2.0000 | INHALATION_SPRAY | Freq: Four times a day (QID) | RESPIRATORY_TRACT | Status: DC | PRN

## 2013-11-09 NOTE — Progress Notes (Signed)
   Subjective:    Patient ID: [name], male    DOB: 10/26/1932, 80 y.o.   MRN: [id number]  Shortness of Breath This is a new problem. The current episode started in the past 7 days. The problem has been unchanged. Pertinent negatives include no chest pain, fever or leg pain. Exacerbated by: physical exertion. The patient has no known risk factors for DVT/PE. He has tried nothing for the symptoms. There is no history of asthma or pneumonia.   Also reports having follow up appointment with cardiology in next few weeks.    Review of Systems  Constitutional: Negative for fever and chills.  Respiratory: Positive for shortness of breath. Negative for chest tightness.   Cardiovascular: Negative for chest pain and palpitations.       Objective:   Physical Exam  Constitutional: He is oriented to person, place, and time. He appears well-developed and well-nourished.  Cardiovascular: Normal rate, regular rhythm and normal heart sounds.   Pulmonary/Chest: Effort normal. No respiratory distress. He has wheezes in the right upper field and the left upper field. He exhibits no tenderness.  Neurological: He is alert and oriented to person, place, and time.  Skin: Skin is warm and dry.  Psychiatric: He has a normal mood and affect.          Assessment & Plan:  1. Shortness of breath  - albuterol (PROVENTIL HFA;VENTOLIN HFA) 108 (90 BASE) MCG/ACT inhaler; Inhale 2 puffs into the lungs every 6 (six) hours as needed for wheezing or shortness of breath.  Dispense: 1 Inhaler; Refill: 1 - Ambulatory referral to Pulmonology  2. Wheezing  - albuterol (PROVENTIL HFA;VENTOLIN HFA) 108 (90 BASE) MCG/ACT inhaler; Inhale 2 puffs into the lungs every 6 (six) hours as needed for wheezing or shortness of breath.  Dispense: 1 Inhaler; Refill: 1 -resolution of wheezing noted after neb treatment -may seek emergency medical treatment if symptoms worsen -Patient verbalized understanding Erby Pian,  [name]-C

## 2013-11-09 NOTE — Patient Instructions (Signed)

## 2013-11-09 NOTE — Telephone Encounter (Signed)
appt for today with mae

## 2013-11-15 ENCOUNTER — Other Ambulatory Visit: Payer: Self-pay | Admitting: General Practice

## 2013-11-15 ENCOUNTER — Telehealth: Payer: Self-pay | Admitting: General Practice

## 2013-11-15 NOTE — Telephone Encounter (Signed)
Referral request made at last visit. He should be contacted with appointment time.

## 2013-11-15 NOTE — Telephone Encounter (Signed)
Has referral been done

## 2013-11-16 NOTE — Telephone Encounter (Signed)
Pt aware and has already received appt date!

## 2013-11-29 ENCOUNTER — Other Ambulatory Visit: Payer: Self-pay | Admitting: Family Medicine

## 2013-11-30 NOTE — Telephone Encounter (Signed)
Last AIC 5.3 on 11/14.

## 2013-11-30 NOTE — Telephone Encounter (Signed)
Patient NTBS for follow up and lab work  

## 2013-12-01 ENCOUNTER — Other Ambulatory Visit: Payer: Self-pay | Admitting: *Deleted

## 2013-12-01 MED ORDER — GLUCOSE BLOOD VI STRP: ORAL_STRIP | Status: DC

## 2013-12-07 ENCOUNTER — Ambulatory Visit (INDEPENDENT_AMBULATORY_CARE_PROVIDER_SITE_OTHER)
Admission: RE | Admit: 2013-12-07 | Discharge: 2013-12-07 | Disposition: A | Discharge status: Home / Self Care | Payer: Commercial Managed Care - HMO | Source: Ambulatory Visit | Attending: Pulmonary Disease | Admitting: Pulmonary Disease

## 2013-12-07 ENCOUNTER — Encounter: Payer: Self-pay | Admitting: Pulmonary Disease

## 2013-12-07 ENCOUNTER — Ambulatory Visit (INDEPENDENT_AMBULATORY_CARE_PROVIDER_SITE_OTHER): Payer: Commercial Managed Care - HMO | Admitting: Pulmonary Disease

## 2013-12-07 VITALS — BP 118/70 | HR 57 | Ht 71.0 in | Wt 220.0 lb

## 2013-12-07 DIAGNOSIS — G4733 Obstructive sleep apnea (adult) (pediatric): Secondary | ICD-10-CM

## 2013-12-07 DIAGNOSIS — R0989 Other specified symptoms and signs involving the circulatory and respiratory systems: Secondary | ICD-10-CM

## 2013-12-07 DIAGNOSIS — R0609 Other forms of dyspnea: Secondary | ICD-10-CM

## 2013-12-07 DIAGNOSIS — R0600 Dyspnea, unspecified: Secondary | ICD-10-CM | POA: Insufficient documentation

## 2013-12-07 HISTORY — DX: Obstructive sleep apnea (adult) (pediatric): G47.33

## 2013-12-07 NOTE — Assessment & Plan Note (Signed)
This is likely multifactorial.  He has extensive history of smoking and has exposure to wood fire stove smoke.  He has favorable clinical response to LABA/ICS regimen.  He likely has obstructive lung disease.  To further assess will arrange for chest xray and PFTs.  He is to continue symbicort, and advised him to get albuterol to use as needed.  He has history of CAD.  Advised him to call Dr. Kae Heller office to schedule follow up to further assess whether his heart disease is contributing to his dyspnea.  He likely also has a component of deconditioning.  Depending on how the rest of his evaluation pans out he would likely benefit from a gradual exercise regimen and weight loss.

## 2013-12-07 NOTE — Patient Instructions (Signed)
Chest xray today Will schedule sleep study and breathing test (PFT) Continue symbicort two puffs twice per day, and rinse mouth after each use Call Dr. Kae Heller office to schedule follow up appointment Follow up in 4 weeks

## 2013-12-07 NOTE — Progress Notes (Deleted)
   Subjective:    Patient ID: [name], male    DOB: 1933-03-30, 80 y.o.   MRN: [id number]  HPI    Review of Systems  Constitutional: Negative for fever, chills, diaphoresis, activity change, appetite change, fatigue and unexpected weight change.  HENT: Negative for congestion, dental problem, ear discharge, ear pain, facial swelling, hearing loss, mouth sores, nosebleeds, postnasal drip, rhinorrhea, sinus pressure, sneezing, sore throat, tinnitus, trouble swallowing and voice change.   Eyes: Negative for photophobia, discharge, itching and visual disturbance.  Respiratory: Positive for cough and shortness of breath. Negative for apnea, choking, chest tightness, wheezing and stridor.   Cardiovascular: Negative for chest pain, palpitations and leg swelling.  Gastrointestinal: Negative for nausea, vomiting, abdominal pain, constipation, blood in stool and abdominal distention.  Genitourinary: Negative for dysuria, urgency, frequency, hematuria, flank pain, decreased urine volume and difficulty urinating.  Musculoskeletal: Positive for arthralgias and joint swelling. Negative for back pain, gait problem, myalgias, neck pain and neck stiffness.  Skin: Negative for color change, pallor and rash.  Neurological: Negative for dizziness, tremors, seizures, syncope, speech difficulty, weakness, light-headedness, numbness and headaches.  Hematological: Negative for adenopathy. Does not bruise/bleed easily.  Psychiatric/Behavioral: Negative for confusion, sleep disturbance and agitation. The patient is not nervous/anxious.        Objective:   Physical Exam        Assessment & Plan:

## 2013-12-07 NOTE — Assessment & Plan Note (Signed)
He has snoring, sleep disruption, witnessed apnea, and daytime sleepiness.  He has history of CAD and HTN.  I am concerned he could have sleep apnea.  We discussed how sleep apnea can affect various health problems including risks for hypertension, cardiovascular disease, and diabetes.  We also discussed how sleep disruption can increase risks for accident, such as while driving.  Weight loss as a means of improving sleep apnea was also reviewed.  Additional treatment options discussed were CPAP therapy, oral appliance, and surgical intervention.  To further assess will arrange for in lab sleep study.

## 2013-12-07 NOTE — Progress Notes (Signed)
Chief Complaint  Patient presents with  . Pulmonary Consult    referred by Dr. Maurene Capes for SOB    History of Present Illness: [name] is a 80 y.o. male former smoker evaluation of dyspnea.  He is accompanied by his wife, and his daughter (cardiac nurse at [employer]).  He has noticed trouble with his breathing for years.  This has gotten worse since this past fall.  He does okay at rest, but gets winded with minimal exertion.  He smoked up to 3 packs of cigarettes per day.  He was told he had COPD, but never had breathing tests and no recent chest xray.  He also has a wood heater.  He has noticed cough and sputum.  He sometimes gets wheezing, but not often.  He denies leg swelling.  He was started on symbicort 3 weeks ago, and this has helped.  He was advised to get albuterol inhaler also, but has not picked this up yet.  He is followed by Dr. Ron Parker from cardiology.  He had CABG in 2012.  He denies chest pain.  He was having dizziness, but this improved after change in his medications.  He does snore, and his wife reports that his breathing gets shallow at night.  He does get sleepy during the day.  He had pneumonia as a child.  He denies history of asthma, or exposure to tuberculosis.  He also used to chew tobacco, but quit in 2000 when he had abdominal aneurysm surgery.  [name]  has a past medical history of Dizziness; Fatigue; HTN (hypertension); Vertigo; AAA (abdominal aortic aneurysm); Dyslipidemia; CAD (coronary artery disease); Elevated glucose; Headache(784.0); Ejection fraction; Prostate cancer; Pneumonia (1940's); GERD (gastroesophageal reflux disease); Arthritis; CVA (cerebral vascular accident); CABG; Thrombocytopenia; Carotid artery disease; Itching; and Hyperbilirubinemia.  [name]  has past surgical history that includes Abdominal aortic aneurysm repair (~ 2000); Femoral artery aneurysm repair (~ 2000); Cholecystectomy; Knee surgery (2009); Inguinal  hernia repair; and Coronary artery bypass graft (08/21/2011).  Prior to Admission medications   Medication Sig Start Date End Date Taking? Authorizing Provider  aspirin 81 MG tablet Take 81 mg by mouth daily.   Yes Historical Provider, MD  budesonide-formoterol (SYMBICORT) 160-4.5 MCG/ACT inhaler Inhale 2 puffs into the lungs 2 (two) times daily. 03/10/13  Yes [name] [name], [name]  glucose blood (BAYER CONTOUR TEST) test strip USE ONE STRIP TO TEST BLOOD SUGAR TWICE DAILY. DX CODE 250.00 12/01/13  Yes [name] [name], [name]  metFORMIN (GLUCOPHAGE) 500 MG tablet TAKE 1 TABLET (500 MG TOTAL) BY MOUTH 2 (TWO) TIMES DAILY WITH A MEAL.   Yes [name] [name], [name]  naproxen sodium (ANAPROX) 220 MG tablet Take 220 mg by mouth daily.   Yes Historical Provider, MD  rosuvastatin (CRESTOR) 10 MG tablet Take 10 mg by mouth every other day. 03/10/13 08/17/14 Yes [name] [name], [name]    Allergies  Allergen Reactions  . Erythromycin   . Penicillins   . Ketorolac Tromethamine Rash    His family history includes Colon cancer in his brother; Heart attack in his brother, father, and mother; Prostate cancer in his brother.  He  reports that he quit smoking about 15 years ago. His smoking use included Cigarettes. He has a 150 pack-year smoking history. He has quit using smokeless tobacco. His smokeless tobacco use included Chew. He reports that he does not drink alcohol or use illicit drugs.  Review of Systems  Constitutional: Negative for fever, chills, diaphoresis, activity  change, appetite change, fatigue and unexpected weight change.  HENT: Negative for congestion, dental problem, ear discharge, ear pain, facial swelling, hearing loss, mouth sores, nosebleeds, postnasal drip, rhinorrhea, sinus pressure, sneezing, sore throat, tinnitus, trouble swallowing and voice change.   Eyes: Negative for photophobia, discharge, itching and visual disturbance.  Respiratory: Positive for cough and shortness  of breath. Negative for apnea, choking, chest tightness, wheezing and stridor.   Cardiovascular: Negative for chest pain, palpitations and leg swelling.  Gastrointestinal: Negative for nausea, vomiting, abdominal pain, constipation, blood in stool and abdominal distention.  Genitourinary: Negative for dysuria, urgency, frequency, hematuria, flank pain, decreased urine volume and difficulty urinating.  Musculoskeletal: Positive for arthralgias and joint swelling. Negative for back pain, gait problem, myalgias, neck pain and neck stiffness.  Skin: Negative for color change, pallor and rash.  Neurological: Negative for dizziness, tremors, seizures, syncope, speech difficulty, weakness, light-headedness, numbness and headaches.  Hematological: Negative for adenopathy. Does not bruise/bleed easily.  Psychiatric/Behavioral: Negative for confusion, sleep disturbance and agitation. The patient is not nervous/anxious.    Physical Exam:  General - No distress ENT - No sinus tenderness, no oral exudate, no LAN, no thyromegaly, TM clear, pupils equal/reactive, MP 4 Cardiac - s1s2 regular, no murmur, pulses symmetric Chest - No wheeze/rales/dullness, good air entry, normal respiratory excursion Back - No focal tenderness Abd - Soft, non-tender, no organomegaly, + bowel sounds Ext - No edema Neuro - Normal strength, cranial nerves intact Skin - No rashes Psych - Normal mood, and behavior   Dg Chest 2 View  09/15/2011   *RADIOLOGY REPORT*  Clinical Data: Cough.  CHEST - 2 VIEW  Comparison: 08/26/2011  Findings: Right lung is clear.  Interval improvement in aeration at the left base with some residual atelectasis or evolving scar noted. The cardiopericardial silhouette is enlarged.  No pleural effusion.  Bones are diffusely demineralized.  IMPRESSION: Continued interval improvement in aeration at the left base.  Original Report Authenticated By: ERIC A. MANSELL, M.D.   Lab Results  Component Value Date    WBC 7.0 04/21/2013   HGB 15.3 04/21/2013   HCT 44.7 04/21/2013   MCV 93 04/21/2013   PLT 118* 04/21/2013    Lab Results  Component Value Date   CREATININE 0.88 07/08/2013   BUN 14 07/08/2013   NA 142 07/08/2013   K 4.5 07/08/2013   CL 103 07/08/2013   CO2 29 07/08/2013    Lab Results  Component Value Date   ALT 17 07/08/2013   AST 20 07/08/2013   ALKPHOS 64 07/08/2013   BILITOT 1.0 07/08/2013    Lab Results  Component Value Date   TSH 1.910 04/21/2013     Assessment/Plan:  Chesley Mires, MD [place] [facility]/Sleep Pager:  [phone]

## 2013-12-08 ENCOUNTER — Telehealth: Payer: Self-pay | Admitting: Pulmonary Disease

## 2013-12-08 NOTE — Telephone Encounter (Signed)
Dg Chest 2 View  12/07/2013   CLINICAL DATA:  Shortness of breath and cough, former smoker  EXAM: CHEST  2 VIEW  COMPARISON:  DG CHEST 2V dated 10/12/2012  FINDINGS: Mild cardiac enlargement. Patient is status post CABG. Vascular pattern is normal. Bilateral nipple shadows are incidentally seen. The lungs are otherwise clear with no infiltrate or consolidation. There is hyperinflation suggesting an element of COPD.  IMPRESSION: Mild COPD with mild cardiac enlargement, unchanged from prior study.   Electronically Signed   By: Skipper Cliche M.D.   On: 12/07/2013 13:03    Will have my nurse inform pt that his CXR shows changes suggestive of COPD.  Will need to review breathing test to assess further.  Please call pt's daughter [name] with results.

## 2013-12-09 NOTE — Telephone Encounter (Signed)
Pt's wife is aware of results. Nothing further was needed. 

## 2013-12-19 ENCOUNTER — Other Ambulatory Visit: Payer: Self-pay

## 2013-12-19 NOTE — Telephone Encounter (Signed)
Last seen 11/09/13  Mae  Last glucose 11/14

## 2013-12-22 ENCOUNTER — Telehealth: Payer: Self-pay | Admitting: Cardiology

## 2013-12-22 NOTE — Telephone Encounter (Signed)
New problem          Pt's daughter called to schedule pt's Echo &  GXT  Per Dr Halford Chessman.    Pt's daughter stated a note has been sent to this office in regards to this.  Pt has a 5/11 appt @ 8 pm & 5/14 @ 9 am appt.   Pt's daughter thinks pt needs the test ( echo /GXT) before these dates.    Please give her a call if any Questions.

## 2013-12-22 NOTE — Telephone Encounter (Signed)
**Note De-Identified  Obfuscation** These tests can be done sooner at the [facility] location. Please arrange.

## 2013-12-23 MED ORDER — METFORMIN HCL 500 MG PO TABS: 500.0000 mg | ORAL_TABLET | Freq: Two times a day (BID) | ORAL | Status: DC

## 2013-12-27 ENCOUNTER — Telehealth: Payer: Self-pay | Admitting: Pulmonary Disease

## 2013-12-27 NOTE — Telephone Encounter (Signed)
Spoke pt's daughter. States that when they saw Dr. Halford Chessman he said he wanted the pt to have a stress test and echo done. We do not have this documented in the pt's OV note. Pt's daughter was very admitant that these tests need to be done. He has an upcoming appointment with Dr. Ron Parker on 01/02/14.  VS - please advise if you would like stress test and echo done. Thanks.

## 2013-12-28 NOTE — Telephone Encounter (Signed)
Spoke with pt EC Neil Crouch Aware of recs per Dr Halford Chessman.   Nothing further needed.

## 2013-12-28 NOTE — Telephone Encounter (Signed)
Please inform pt that decision about whether he needs Echo and/or stress test will be done by Dr. Ron Parker.  He needs to have assessment by Dr. Ron Parker first to determine if he needs additional cardiac testing, and if so, then which tests are most appropriate.

## 2013-12-28 NOTE — Telephone Encounter (Signed)
atc pt's daughter. Her mailbox is full. Will try back later.

## 2014-01-02 ENCOUNTER — Ambulatory Visit (HOSPITAL_BASED_OUTPATIENT_CLINIC_OR_DEPARTMENT_OTHER): Payer: 59 | Attending: Pulmonary Disease

## 2014-01-02 VITALS — Ht 71.0 in | Wt 220.0 lb

## 2014-01-02 DIAGNOSIS — I10 Essential (primary) hypertension: Secondary | ICD-10-CM | POA: Insufficient documentation

## 2014-01-02 DIAGNOSIS — Z7982 Long term (current) use of aspirin: Secondary | ICD-10-CM | POA: Insufficient documentation

## 2014-01-02 DIAGNOSIS — G4733 Obstructive sleep apnea (adult) (pediatric): Secondary | ICD-10-CM

## 2014-01-02 DIAGNOSIS — Z79899 Other long term (current) drug therapy: Secondary | ICD-10-CM | POA: Insufficient documentation

## 2014-01-02 DIAGNOSIS — I2510 Atherosclerotic heart disease of native coronary artery without angina pectoris: Secondary | ICD-10-CM | POA: Insufficient documentation

## 2014-01-02 DIAGNOSIS — G4730 Sleep apnea, unspecified: Secondary | ICD-10-CM | POA: Insufficient documentation

## 2014-01-05 ENCOUNTER — Ambulatory Visit: Payer: Commercial Managed Care - HMO | Admitting: Pulmonary Disease

## 2014-01-05 ENCOUNTER — Telehealth: Payer: Self-pay | Admitting: Pulmonary Disease

## 2014-01-05 DIAGNOSIS — G4733 Obstructive sleep apnea (adult) (pediatric): Secondary | ICD-10-CM

## 2014-01-05 NOTE — Sleep Study (Signed)
[place]  NAME: [name] DATE OF BIRTH:  [birth year] MEDICAL RECORD NUMBER [id number]  LOCATION: [facility]  PHYSICIAN: Chesley Mires, M.D. DATE OF STUDY: 01/02/2014  SLEEP STUDY TYPE: Polysomnogram               REFERRING PHYSICIAN: Chesley Mires, MD  INDICATION FOR STUDY:  [name] is a 80 y.o. male who presents to the sleep lab for evaluation of hypersomnia with obstructive sleep apnea.  He reports snoring, sleep disruption, apnea, and daytime sleepiness.  He has history of CAD and HTN.  EPWORTH SLEEPINESS SCORE: 5. HEIGHT: 5\' 11"  (180.3 cm)  WEIGHT: 220 lb (99.791 kg)    Body mass index is 30.7 kg/(m^2).  NECK SIZE: 17 in.  MEDICATIONS:  Current Outpatient Prescriptions on File Prior to Visit  Medication Sig Dispense Refill  . aspirin 81 MG tablet Take 81 mg by mouth daily.      . budesonide-formoterol (SYMBICORT) 160-4.5 MCG/ACT inhaler Inhale 2 puffs into the lungs 2 (two) times daily.  1 Inhaler  5  . glucose blood (BAYER CONTOUR TEST) test strip USE ONE STRIP TO TEST BLOOD SUGAR TWICE DAILY. DX CODE 250.00  100 each  0  . metFORMIN (GLUCOPHAGE) 500 MG tablet Take 1 tablet (500 mg total) by mouth 2 (two) times daily with a meal.  60 tablet  0  . naproxen sodium (ANAPROX) 220 MG tablet Take 220 mg by mouth daily.      . rosuvastatin (CRESTOR) 10 MG tablet Take 10 mg by mouth every other day.       No current facility-administered medications on file prior to visit.    SLEEP ARCHITECTURE:  Total recording time: 410 minutes.  Total sleep time was: 318.5 minutes.  Sleep efficiency: 77.7%.  Sleep latency: 23 minutes.  REM latency: 65 minutes.  Stage N1: 9.3%.  Stage N2: 74.4%.  Stage N3: 0%.  Stage R:  16.3%.  Supine sleep: 0 minutes.  Non-supine sleep: 318.5 minutes.  CARDIAC DATA:  Average heart rate: 66 beats per minute. Rhythm strip: sinus rhythm with sinus arrhythmia and PVC's.  RESPIRATORY DATA: Average  respiratory rate: 16. Snoring: loud. Average AHI: 15.8.   Apnea index: 9.6.  Hypopnea index: 6.2. Obstructive apnea index: 5.1.  Central apnea index: 3.4.  Mixed apnea index: 1.1. REM AHI: 25.4.  NREM AHI: 14. Supine AHI: 0. Non-supine AHI: 12.9.  MOVEMENT/PARASOMNIA:  Periodic limb movement: 36.7.  Period limb movements with arousals: 0.4. Restroom trips: two.  OXYGEN DATA:  Baseline oxygenation: 93%. Lowest SaO2: 83%. Time spent below SaO2 90%: 26.1 minutes. Supplemental oxygen used: none.  IMPRESSION/ RECOMMENDATION:   This study shows mild to moderate sleep apnea with an AHI of 15.8 and SaO2 low of 83%.  He had significant REM effect.  He had an increase in his periodic limb movement index.   Additional therapies include weight loss, CPAP, oral appliance, or surgical evaluation.   Chesley Mires, M.D. Diplomate, Tax adviser of Sleep Medicine  ELECTRONICALLY SIGNED ON:  01/05/2014, 8:29 AM [place] PH: [phone]   FX: [phone] [place]

## 2014-01-05 NOTE — Telephone Encounter (Signed)
OV has been scheduled for 01/12/14 at 4pm.

## 2014-01-05 NOTE — Telephone Encounter (Signed)
PSG 01/02/14 >> AHI 15.8, SaO2 low 83%, PLMI 36.7.  Will have my nurse schedule ROV to review results.

## 2014-01-12 ENCOUNTER — Other Ambulatory Visit: Payer: Self-pay | Admitting: Nurse Practitioner

## 2014-01-12 ENCOUNTER — Ambulatory Visit: Payer: 59 | Admitting: Pulmonary Disease

## 2014-01-13 NOTE — Telephone Encounter (Signed)
Patient NTBS for follow up and lab work  

## 2014-01-13 NOTE — Telephone Encounter (Signed)
Last A1C 06/2013

## 2014-01-27 ENCOUNTER — Ambulatory Visit (INDEPENDENT_AMBULATORY_CARE_PROVIDER_SITE_OTHER): Payer: Commercial Managed Care - HMO | Admitting: Cardiology

## 2014-01-27 ENCOUNTER — Encounter: Payer: Self-pay | Admitting: Cardiology

## 2014-01-27 VITALS — BP 167/70 | HR 72 | Ht 71.0 in | Wt 217.8 lb

## 2014-01-27 DIAGNOSIS — I10 Essential (primary) hypertension: Secondary | ICD-10-CM

## 2014-01-27 DIAGNOSIS — R0609 Other forms of dyspnea: Secondary | ICD-10-CM

## 2014-01-27 DIAGNOSIS — IMO0002 Reserved for concepts with insufficient information to code with codable children: Secondary | ICD-10-CM

## 2014-01-27 DIAGNOSIS — R9430 Abnormal result of cardiovascular function study, unspecified: Secondary | ICD-10-CM

## 2014-01-27 DIAGNOSIS — R5381 Other malaise: Secondary | ICD-10-CM

## 2014-01-27 DIAGNOSIS — I2510 Atherosclerotic heart disease of native coronary artery without angina pectoris: Secondary | ICD-10-CM

## 2014-01-27 DIAGNOSIS — R0600 Dyspnea, unspecified: Secondary | ICD-10-CM

## 2014-01-27 DIAGNOSIS — E785 Hyperlipidemia, unspecified: Secondary | ICD-10-CM

## 2014-01-27 DIAGNOSIS — R5383 Other fatigue: Secondary | ICD-10-CM

## 2014-01-27 DIAGNOSIS — R0602 Shortness of breath: Secondary | ICD-10-CM

## 2014-01-27 DIAGNOSIS — R0989 Other specified symptoms and signs involving the circulatory and respiratory systems: Secondary | ICD-10-CM

## 2014-01-27 DIAGNOSIS — Z951 Presence of aortocoronary bypass graft: Secondary | ICD-10-CM

## 2014-01-27 MED ORDER — LOSARTAN POTASSIUM 50 MG PO TABS: 50.0000 mg | ORAL_TABLET | Freq: Every day | ORAL | Status: DC

## 2014-01-27 NOTE — Assessment & Plan Note (Signed)
Diabetes is being treated. No change in therapy.

## 2014-01-27 NOTE — Progress Notes (Signed)
Patient ID: [name], male   DOB: 07/20/1933, 80 y.o.   MRN: [id number]    HPI  The patient is seen today to followup coronary disease. He underwent bypass surgery in September, 2012. LV function was good at that time. He has not had a followup 2-D echo to reassess his LV function after his surgery. This winter he had increased shortness of breath. He is being assessed fully by the pulmonary team. He was started on Symbicort with some improvement. His wife also describes that he had some upper respiratory congestion. This has improved. There was question as to whether there could be an allergy component. He and his wife he to home with what and he often feels better if he sits in a room separated from the heater. He is feeling better now that it is not the time of the year where that would heat he is needed. His daughter is here today and she is very knowledgeable of his overall situation. She wants to be sure that his cardiac status is not playing a role in his shortness of breath. He is a very active gentleman. He has not been able to be quite as active as he had in the past. However he is better today than he has been over months before seeing the pulmonary team.  As part of today's evaluation I have reviewed the record including the notes from pulmonary. I've also reviewed the sleep study results. It appears that in addition he does have some sleep apnea. His pulmonary function studies are still to be done.  Allergies  Allergen Reactions  . Erythromycin   . Penicillins   . Ketorolac Tromethamine Rash    Current Outpatient Prescriptions  Medication Sig Dispense Refill  . aspirin 81 MG tablet Take 81 mg by mouth daily.      [name] BAYER CONTOUR TEST test strip USE ONE STRIP TWICE DAILY TO TEST BLOOD  SUGAR LEVELS  100 each  0  . budesonide-formoterol (SYMBICORT) 160-4.5 MCG/ACT inhaler Inhale 2 puffs into the lungs 2 (two) times daily.  1 Inhaler  5  . metFORMIN (GLUCOPHAGE) 500 MG tablet Take 1  tablet (500 mg total) by mouth 2 (two) times daily with a meal.  60 tablet  0  . naproxen sodium (ANAPROX) 220 MG tablet Take 220 mg by mouth daily.      . rosuvastatin (CRESTOR) 10 MG tablet Take 10 mg by mouth every other day.       No current facility-administered medications for this visit.    History   Social History  . Marital Status: Married    Spouse Name: N/A    Number of Children: 4  . Years of Education: N/A   Occupational History  . retired    Social History Main Topics  . Smoking status: Former Smoker -- 3.00 packs/day for 50 years    Types: Cigarettes    Quit date: 08/25/1998  . Smokeless tobacco: Former Systems developer    Types: Chew     Comment: quit smoking cigarettes "2000"  . Alcohol Use: No     Comment: 08/15/11 "last alcohol was too long ago to count"  . Drug Use: No  . Sexual Activity: No   Other Topics Concern  . Not on file   Social History Narrative  . No narrative on file    Family History  Problem Relation Age of Onset  . Heart attack Mother   . Heart attack Father   . Heart attack  Brother   . Prostate cancer Brother   . Colon cancer Brother     Past Medical History  Diagnosis Date  . Dizziness   . Fatigue     chronic  . HTN (hypertension)   . Vertigo   . AAA (abdominal aortic aneurysm)     Surgery Dr Arbie Cookey 2000. /  Ultrasound October, 2012, no significant abnormality, technically difficult  . Dyslipidemia     Triglycerides elevated  . CAD (coronary artery disease)     05/2011 Nuclear normal  /  chest pain December, 2012, CABG  . Elevated glucose   . FLAUKCRB(765.2)     October, 2012  . Ejection fraction     EF normal, nuclear, October, 2012  . Prostate cancer     Dr.Wrenn; S/P radiation  . Pneumonia 1940's  . GERD (gastroesophageal reflux disease)   . Arthritis   . CVA (cerebral vascular accident)     Old left frontal infarct by MRI 2008  . Hx of CABG     August 21, 2011, Dr. Cornelius Moras, LIMA to distal LAD, SVG acute marginal of  RCA, SVG to diagonal  . Thrombocytopenia     Bone marrow biopsy August 20, 2011  . Carotid artery disease     Doppler, hospital, December, 2012, no significant  carotid stenoses  . Itching     May, 2013  . Hyperbilirubinemia     January, 2014.Marland KitchenMarland KitchenDr Teena Dunk    Past Surgical History  Procedure Laterality Date  . Abdominal aortic aneurysm repair  ~ 2000  . Femoral artery aneurysm repair  ~ 2000  . Cholecystectomy    . Knee surgery  2009    left; repair  . Inguinal hernia repair      X3?  [name] Coronary artery bypass graft  08/21/2011    Procedure: CORONARY ARTERY BYPASS GRAFTING (CABG);  Surgeon: Purcell Nails, MD;  Location: [facility];  Service: Open Heart Surgery;  Laterality: N/A;  Coronary Artery Bypass graft on pump times three utlizing the left internal mammary artery and right greater saphenous vein harvested endoscopically    Patient Active Problem List   Diagnosis Date Noted  . S/P CABG x 3 08/21/2011    Priority: High  . Dyspnea 12/07/2013  . OSA (obstructive sleep apnea) 12/07/2013  . Sinus bradycardia 05/17/2013  . Hyperbilirubinemia 11/05/2012  . Chest pain 04/21/2012  . Itching   . CAD (coronary artery disease)   . Carotid artery disease   . Thrombocytopenia   . Bronchitis 08/15/2011  . Ejection fraction   . S/P AAA repair   . Dizziness   . Fatigue   . HTN (hypertension)   . CVA (cerebral vascular accident)   . Vertigo   . Prostate cancer   . Dyslipidemia   . Elevated glucose   . Headache   . Diabetes mellitus 11/28/2010    ROS   Patient denies fever, chills, headache, sweats, rash, change in vision, change in hearing, chest pain, nausea vomiting, urinary symptoms. All other systems are reviewed and are negative.  PHYSICAL EXAM  Patient is stable. He is overweight. He is here with his wife and his daughter. Head is atraumatic. Sclera and conjunctiva are normal. There is no jugulovenous distention. There is some kyphosis of the thoracic spine. Lungs reveal  no rales. There is no respiratory distress. Cardiac exam reveals S1-S2. The abdomen is soft but protuberant. There is no peripheral edema. There are no musculoskeletal deformities. There are no skin rashes. He walks with  a cane.  Filed Vitals:   01/27/14 0832  BP: 167/70  Pulse: 72  Height: $Remove'5\' 11"'[id number]$  (1.803 m)  Weight: 217 lb 12.8 oz (98.793 kg)   EKG is done today and reviewed by me. He has increased voltage. There are nonspecific ST-T wave changes. There is an increased R wave in lead V1. There is no significant change from the past.  ASSESSMENT & PLAN

## 2014-01-27 NOTE — Assessment & Plan Note (Signed)
Systolic blood pressure is mildly elevated today. I will consider the addition of a low-dose ACE inhibitor or ARB.

## 2014-01-27 NOTE — Assessment & Plan Note (Signed)
The patient is undergoing full valuation of his shortness of breath. He does have some sleep apnea. It does seem that some of his symptoms have improved during the period that he is on Symbicort. Some of his symptoms are also better with less upper chest congestion and being away from the wood heating in his home. 2-D echo be done to reassess LV function. Otherwise I will follow the pulmonary valuation and see the patient back for followup.  As part of today's evaluation I spent greater than 25 minutes with his total care. More than half of this time has been with direct contact with the patient and his wife and his daughter.

## 2014-01-27 NOTE — Assessment & Plan Note (Signed)
He has some fatigue. At this point there is no definite proof that it is based on his cardiac status. No further workup.

## 2014-01-27 NOTE — Assessment & Plan Note (Signed)
His ejection fraction was normal before bypass surgery. However it is appropriate to repeat 2-D echo now with his shortness of breath.

## 2014-01-27 NOTE — Assessment & Plan Note (Signed)
Patient underwent bypass surgery in 2012. He has been stable since then, other than the shortness of breath.

## 2014-01-27 NOTE — Patient Instructions (Signed)
Your physician has requested that you have an echocardiogram. Echocardiography is a painless test that uses sound waves to create images of your heart. It provides your doctor with information about the size and shape of your heart and how well your heart's chambers and valves are working. This procedure takes approximately one hour. There are no restrictions for this procedure. Office will contact with results via phone or letter.   Begin Losartan 50mg  daily - new sent to pharm Continue all other medications.   Your physician has requested that you regularly monitor and record your blood pressure readings at home. Please check at varied times of the day & bring back to office for MD review.  Follow up as scheduled

## 2014-01-27 NOTE — Assessment & Plan Note (Signed)
The patient is receiving a statin appropriately. Over time we will consider increasing the dose but I am not inclined to do that with his current fatigue.

## 2014-01-27 NOTE — Assessment & Plan Note (Signed)
Coronary disease is stable. So far there is no proof that his shortness of breath is cardiac in origin. I will not be arranging a stress test at this time. 2-D echo will be done to reassess LV function.

## 2014-01-30 ENCOUNTER — Other Ambulatory Visit: Payer: Self-pay | Admitting: *Deleted

## 2014-01-30 MED ORDER — LOSARTAN POTASSIUM 50 MG PO TABS: 50.0000 mg | ORAL_TABLET | Freq: Every day | ORAL | Status: DC

## 2014-02-08 ENCOUNTER — Ambulatory Visit (INDEPENDENT_AMBULATORY_CARE_PROVIDER_SITE_OTHER): Payer: Medicare HMO | Admitting: Nurse Practitioner

## 2014-02-08 ENCOUNTER — Encounter: Payer: Self-pay | Admitting: Nurse Practitioner

## 2014-02-08 VITALS — BP 160/80 | HR 60 | Temp 98.2°F | Ht 71.0 in | Wt 219.4 lb

## 2014-02-08 DIAGNOSIS — Z951 Presence of aortocoronary bypass graft: Secondary | ICD-10-CM

## 2014-02-08 DIAGNOSIS — E1110 Type 2 diabetes mellitus with ketoacidosis without coma: Secondary | ICD-10-CM

## 2014-02-08 DIAGNOSIS — E785 Hyperlipidemia, unspecified: Secondary | ICD-10-CM

## 2014-02-08 DIAGNOSIS — Z8679 Personal history of other diseases of the circulatory system: Secondary | ICD-10-CM

## 2014-02-08 DIAGNOSIS — R001 Bradycardia, unspecified: Secondary | ICD-10-CM

## 2014-02-08 DIAGNOSIS — I10 Essential (primary) hypertension: Secondary | ICD-10-CM

## 2014-02-08 DIAGNOSIS — I2510 Atherosclerotic heart disease of native coronary artery without angina pectoris: Secondary | ICD-10-CM

## 2014-02-08 DIAGNOSIS — Z0000 Encounter for general adult medical examination without abnormal findings: Secondary | ICD-10-CM

## 2014-02-08 DIAGNOSIS — Z9889 Other specified postprocedural states: Secondary | ICD-10-CM

## 2014-02-08 DIAGNOSIS — I498 Other specified cardiac arrhythmias: Secondary | ICD-10-CM

## 2014-02-08 DIAGNOSIS — E1310 Other specified diabetes mellitus with ketoacidosis without coma: Secondary | ICD-10-CM

## 2014-02-08 LAB — POCT GLYCOSYLATED HEMOGLOBIN (HGB A1C): Hemoglobin A1C: 5.9

## 2014-02-08 NOTE — Patient Instructions (Signed)
Health Maintenance A healthy lifestyle and preventative care can promote health and wellness.  Maintain regular health, dental, and eye exams.  Eat a healthy diet. Foods like vegetables, fruits, whole grains, low-fat dairy products, and lean protein foods contain the nutrients you need and are low in calories. Decrease your intake of foods high in solid fats, added sugars, and salt. Get information about a proper diet from your health care Ell Tiso, if necessary.  Regular physical exercise is one of the most important things you can do for your health. Most adults should get at least 150 minutes of moderate-intensity exercise (any activity that increases your heart rate and causes you to sweat) each week. In addition, most adults need muscle-strengthening exercises on 2 or more days a week.   Maintain a healthy weight. The body mass index (BMI) is a screening tool to identify possible weight problems. It provides an estimate of body fat based on height and weight. Your health care Mekiah Wahler can find your BMI and can help you achieve or maintain a healthy weight. For males 20 years and older:  A BMI below 18.5 is considered underweight.  A BMI of 18.5 to 24.9 is normal.  A BMI of 25 to 29.9 is considered overweight.  A BMI of 30 and above is considered obese.  Maintain normal blood lipids and cholesterol by exercising and minimizing your intake of saturated fat. Eat a balanced diet with plenty of fruits and vegetables. Blood tests for lipids and cholesterol should begin at age 20 and be repeated every 5 years. If your lipid or cholesterol levels are high, you are over age 50, or you are at high risk for heart disease, you may need your cholesterol levels checked more frequently.Ongoing high lipid and cholesterol levels should be treated with medicines if diet and exercise are not working.  If you smoke, find out from your health care Richardine Peppers how to quit. If you do not use tobacco, do not  start.  Lung cancer screening is recommended for adults aged 55-80 years who are at high risk for developing lung cancer because of a history of smoking. A yearly low-dose CT scan of the lungs is recommended for people who have at least a 30-pack-year history of smoking and are current smokers or have quit within the past 15 years. A pack year of smoking is smoking an average of 1 pack of cigarettes a day for 1 year (for example, a 30-pack-year history of smoking could mean smoking 1 pack a day for 30 years or 2 packs a day for 15 years). Yearly screening should continue until the smoker has stopped smoking for at least 15 years. Yearly screening should be stopped for people who develop a health problem that would prevent them from having lung cancer treatment.  If you choose to drink alcohol, do not have more than 2 drinks per day. One drink is considered to be 12 oz (360 mL) of beer, 5 oz (150 mL) of wine, or 1.5 oz (45 mL) of liquor.  Avoid the use of street drugs. Do not share needles with anyone. Ask for help if you need support or instructions about stopping the use of drugs.  High blood pressure causes heart disease and increases the risk of stroke. Blood pressure should be checked at least every 1-2 years. Ongoing high blood pressure should be treated with medicines if weight loss and exercise are not effective.  If you are 45-79 years old, ask your health care Darien Kading if   you should take aspirin to prevent heart disease.  Diabetes screening involves taking a blood sample to check your fasting blood sugar level. This should be done once every 3 years after age 45 if you are at a normal weight and without risk factors for diabetes. Testing should be considered at a younger age or be carried out more frequently if you are overweight and have at least 1 risk factor for diabetes.  Colorectal cancer can be detected and often prevented. Most routine colorectal cancer screening begins at the age of 50  and continues through age 75. However, your health care Digby Groeneveld may recommend screening at an earlier age if you have risk factors for colon cancer. On a yearly basis, your health care Ercel Pepitone may provide home test kits to check for hidden blood in the stool. A small camera at the end of a tube may be used to directly examine the colon (sigmoidoscopy or colonoscopy) to detect the earliest forms of colorectal cancer. Talk to your health care Caly Pellum about this at age 50 when routine screening begins. A direct exam of the colon should be repeated every 5-10 years through age 75, unless early forms of precancerous polyps or small growths are found.  People who are at an increased risk for hepatitis B should be screened for this virus. You are considered at high risk for hepatitis B if:  You were born in a country where hepatitis B occurs often. Talk with your health care Jartavious Mckimmy about which countries are considered high risk.  Your parents were born in a high-risk country and you have not received a shot to protect against hepatitis B (hepatitis B vaccine).  You have HIV or AIDS.  You use needles to inject street drugs.  You live with, or have sex with, someone who has hepatitis B.  You are a man who has sex with other men (MSM).  You get hemodialysis treatment.  You take certain medicines for conditions like cancer, organ transplantation, and autoimmune conditions.  Hepatitis C blood testing is recommended for all people born from 1945 through 1965 and any individual with known risk factors for hepatitis C.  Healthy men should no longer receive prostate-specific antigen (PSA) blood tests as part of routine cancer screening. Talk to your health care Dakota Vanwart about prostate cancer screening.  Testicular cancer screening is not recommended for adolescents or adult males who have no symptoms. Screening includes self-exam, a health care Naydene Kamrowski exam, and other screening tests. Consult with your  health care Sharnell Knight about any symptoms you have or any concerns you have about testicular cancer.  Practice safe sex. Use condoms and avoid high-risk sexual practices to reduce the spread of sexually transmitted infections (STIs).  You should be screened for STIs, including gonorrhea and chlamydia if:  You are sexually active and are younger than 24 years.  You are older than 24 years, and your health care Paddy Walthall tells you that you are at risk for this type of infection.  Your sexual activity has changed since you were last screened, and you are at an increased risk for chlamydia or gonorrhea. Ask your health care Mikhaila Roh if you are at risk.  If you are at risk of being infected with HIV, it is recommended that you take a prescription medicine daily to prevent HIV infection. This is called pre-exposure prophylaxis (PrEP). You are considered at risk if:  You are a man who has sex with other men (MSM).  You are a heterosexual man who   is sexually active with multiple partners.  You take drugs by injection.  You are sexually active with a partner who has HIV.  Talk with your health care Eyad Rochford about whether you are at high risk of being infected with HIV. If you choose to begin PrEP, you should first be tested for HIV. You should then be tested every 3 months for as long as you are taking PrEP.  Use sunscreen. Apply sunscreen liberally and repeatedly throughout the day. You should seek shade when your shadow is shorter than you. Protect yourself by wearing long sleeves, pants, a wide-brimmed hat, and sunglasses year round whenever you are outdoors.  Tell your health care Pruitt Taboada of new moles or changes in moles, especially if there is a change in shape or color. Also, tell your health care Leilanee Righetti if a mole is larger than the size of a pencil eraser.  A one-time screening for abdominal aortic aneurysm (AAA) and surgical repair of large AAAs by ultrasound is recommended for men aged  65-75 years who are current or former smokers.  Stay current with your vaccines (immunizations). Document Released: 02/07/2008 Document Revised: 08/16/2013 Document Reviewed: 01/06/2011 Exit[employer] Patient Information 2015 [employer]. This information is not intended to replace advice given to you by your health care Coreen Shippee. Make sure you discuss any questions you have with your health care Myelle Poteat.  

## 2014-02-08 NOTE — Progress Notes (Signed)
Subjective:    Patient ID: [name], male    DOB: 26-Mar-1933, 80 y.o.   MRN: [id number]  Patient here today for follow up of chronic medical problems. Checking blood glucose regularly ranging in the 100's . BP averaging 130/80's to Pt had a sleep study done about a month ago, he had a follow up with cardiologist and has Echocardiogram schedule for next week.   Diabetes He presents for his follow-up diabetic visit. He has type 2 diabetes mellitus. His disease course has been stable. There are no hypoglycemic associated symptoms. Pertinent negatives for diabetes include no chest pain, no foot ulcerations and no visual change. There are no hypoglycemic complications. Symptoms are stable. There are no diabetic complications. Risk factors for coronary artery disease include diabetes mellitus, dyslipidemia, male sex, hypertension and family history. Current diabetic treatment includes oral agent (monotherapy). He is compliant with treatment all of the time. His weight is stable. He is following a generally healthy diet. He participates in exercise intermittently. His home blood glucose trend is fluctuating minimally. His breakfast blood glucose is taken between 6-7 am. His breakfast blood glucose range is generally 110-130 mg/dl. His highest blood glucose is 180-200 mg/dl. His overall blood glucose range is 130-140 mg/dl. An ACE inhibitor/angiotensin II receptor blocker is not being taken. He does not see a podiatrist.Eye exam is current.  Hyperlipidemia This is a chronic problem. The current episode started more than 1 year ago. The problem is controlled. Recent lipid tests were reviewed and are normal. Exacerbating diseases include diabetes. Associated symptoms include shortness of breath. Pertinent negatives include no chest pain. Current antihyperlipidemic treatment includes statins. The current treatment provides significant improvement of lipids. Risk factors for coronary artery disease include  diabetes mellitus, dyslipidemia, family history, hypertension and male sex.  Hypertension This is a chronic problem. The current episode started more than 1 year ago. The problem has been waxing and waning since onset. The problem is controlled. Associated symptoms include shortness of breath. Pertinent negatives include no chest pain or palpitations. Risk factors for coronary artery disease include diabetes mellitus, dyslipidemia, family history and male gender. Past treatments include beta blockers. The current treatment provides mild improvement. There is no history of a thyroid problem.      Review of Systems  Respiratory: Positive for shortness of breath.   Cardiovascular: Negative for chest pain and palpitations.  Musculoskeletal: Positive for gait problem.       Uses a cane.   Neurological: Negative for numbness.  All other systems reviewed and are negative.      Objective:   Physical Exam  Constitutional: He is oriented to person, place, and time. He appears well-developed and well-nourished.  HENT:  Head: Normocephalic.  Right Ear: External ear normal.  Left Ear: External ear normal.  Eyes: Pupils are equal, round, and reactive to light.  Neck: Normal range of motion. Neck supple.  Cardiovascular: Normal rate, regular rhythm, normal heart sounds and intact distal pulses.   Pulmonary/Chest: Effort normal and breath sounds normal.  Abdominal: Soft. Bowel sounds are normal. There is no tenderness.  Musculoskeletal: Normal range of motion. He exhibits no edema.  Neurological: He is alert and oriented to person, place, and time.  Skin: Skin is warm and dry.  Psychiatric: He has a normal mood and affect. His behavior is normal. Judgment and thought content normal.    BP 160/80  Pulse 60  Temp(Src) 98.2 F (36.8 C) (Oral)  Ht $R'5\' 11"'VX$  (1.803 m)  Wt  219 lb 6.4 oz (99.519 kg)  BMI 30.61 kg/m2    Results for orders placed in visit on 02/08/14  POCT GLYCOSYLATED HEMOGLOBIN  (HGB A1C)      Result Value Ref Range   Hemoglobin A1C 5.9         Assessment & Plan:   1. DM (diabetes mellitus) type 2, uncontrolled, with ketoacidosis   2. HTN (hypertension)   3. Other and unspecified hyperlipidemia   4. Routine general medical examination at a health care facility   5. Hyperlipidemia LDL goal < 100    Orders Placed This Encounter  Procedures  . NMR, lipoprofile  . CMP14+EGFR  . PSA, total and free  . POCT glycosylated hemoglobin (Hb A1C)     Labs pending Health maintenance reviewed Diet and exercise encouraged Continue all meds Follow up  In 3 months   [place], [name]

## 2014-02-09 LAB — CMP14+EGFR
A/G RATIO: 2.2 (ref 1.1–2.5)
ALK PHOS: 66 IU/L (ref 39–117)
ALT: 18 IU/L (ref 0–44)
AST: 16 IU/L (ref 0–40)
Albumin: 4.3 g/dL (ref 3.5–4.8)
BILIRUBIN TOTAL: 1.1 mg/dL (ref 0.0–1.2)
BUN/Creatinine Ratio: 18 (ref 10–22)
BUN: 16 mg/dL (ref 8–27)
CHLORIDE: 101 mmol/L (ref 97–108)
CO2: 26 mmol/L (ref 18–29)
Calcium: 9.8 mg/dL (ref 8.6–10.2)
Creatinine, Ser: 0.88 mg/dL (ref 0.76–1.27)
GFR calc non Af Amer: 82 mL/min/{1.73_m2} (ref >59)
GFR, EST AFRICAN AMERICAN: 95 mL/min/{1.73_m2} (ref >59)
Globulin, Total: 2 g/dL (ref 1.5–4.5)
Glucose: 134 mg/dL — ABNORMAL HIGH (ref 65–99)
POTASSIUM: 4.6 mmol/L (ref 3.5–5.2)
Sodium: 140 mmol/L (ref 134–144)
Total Protein: 6.3 g/dL (ref 6.0–8.5)

## 2014-02-09 LAB — NMR, LIPOPROFILE
Cholesterol: 101 mg/dL (ref 100–199)
HDL CHOLESTEROL BY NMR: 27 mg/dL — AB (ref >39)
HDL PARTICLE NUMBER: 28.7 umol/L — AB (ref >=30.5)
LDL Particle Number: 417 nmol/L (ref <1000)
LDL Size: 19.8 nm (ref >20.5)
LDLC SERPL CALC-MCNC: 39 mg/dL (ref 0–99)
LP-IR Score: 54 — ABNORMAL HIGH (ref <=45)
Small LDL Particle Number: 256 nmol/L (ref <=527)
TRIGLYCERIDES BY NMR: 174 mg/dL — AB (ref 0–149)

## 2014-02-09 LAB — PSA, TOTAL AND FREE
PSA, Free Pct: 11.9 %
PSA, Free: 0.25 ng/mL
PSA: 2.1 ng/mL (ref 0.0–4.0)

## 2014-02-10 ENCOUNTER — Telehealth: Payer: Self-pay | Admitting: Nurse Practitioner

## 2014-02-10 NOTE — Telephone Encounter (Signed)
Message copied by Cline Crock on Fri Feb 10, 2014  9:00 AM ------      Message from: [name]      Created: Thu Feb 09, 2014  7:56 AM       Hgba1c discussed at appointment      Kidney and liver function stable      Cholesterol looks great      Continue current meds- low fat diet and exercise and recheck in 3 months       ------

## 2014-02-10 NOTE — Telephone Encounter (Signed)
Pt aware of labs  

## 2014-02-15 ENCOUNTER — Other Ambulatory Visit: Payer: Self-pay | Admitting: *Deleted

## 2014-02-15 ENCOUNTER — Ambulatory Visit (HOSPITAL_COMMUNITY)
Admission: RE | Admit: 2014-02-15 | Discharge: 2014-02-15 | Disposition: A | Discharge status: Home / Self Care | Payer: Medicare HMO | Source: Ambulatory Visit | Attending: Cardiology | Admitting: Cardiology

## 2014-02-15 ENCOUNTER — Other Ambulatory Visit (HOSPITAL_COMMUNITY): Payer: Self-pay | Admitting: Cardiology

## 2014-02-15 DIAGNOSIS — I25709 Atherosclerosis of coronary artery bypass graft(s), unspecified, with unspecified angina pectoris: Secondary | ICD-10-CM

## 2014-02-15 DIAGNOSIS — R0602 Shortness of breath: Secondary | ICD-10-CM

## 2014-02-15 DIAGNOSIS — I359 Nonrheumatic aortic valve disorder, unspecified: Secondary | ICD-10-CM

## 2014-02-15 DIAGNOSIS — R0609 Other forms of dyspnea: Secondary | ICD-10-CM | POA: Insufficient documentation

## 2014-02-15 DIAGNOSIS — I2510 Atherosclerotic heart disease of native coronary artery without angina pectoris: Secondary | ICD-10-CM

## 2014-02-15 DIAGNOSIS — R0989 Other specified symptoms and signs involving the circulatory and respiratory systems: Principal | ICD-10-CM | POA: Insufficient documentation

## 2014-02-15 NOTE — Progress Notes (Signed)
*  PRELIMINARY RESULTS* Echocardiogram 2D Echocardiogram has been performed.  Leavy Cella 02/15/2014, 9:36 AM

## 2014-02-20 ENCOUNTER — Ambulatory Visit (INDEPENDENT_AMBULATORY_CARE_PROVIDER_SITE_OTHER): Payer: Commercial Managed Care - HMO | Admitting: Pulmonary Disease

## 2014-02-20 ENCOUNTER — Encounter: Payer: Self-pay | Admitting: Pulmonary Disease

## 2014-02-20 VITALS — BP 110/60 | HR 65 | Ht 71.0 in | Wt 217.0 lb

## 2014-02-20 DIAGNOSIS — R0600 Dyspnea, unspecified: Secondary | ICD-10-CM

## 2014-02-20 DIAGNOSIS — R0609 Other forms of dyspnea: Secondary | ICD-10-CM

## 2014-02-20 DIAGNOSIS — J449 Chronic obstructive pulmonary disease, unspecified: Secondary | ICD-10-CM

## 2014-02-20 DIAGNOSIS — J410 Simple chronic bronchitis: Secondary | ICD-10-CM

## 2014-02-20 DIAGNOSIS — R0989 Other specified symptoms and signs involving the circulatory and respiratory systems: Secondary | ICD-10-CM

## 2014-02-20 DIAGNOSIS — G4733 Obstructive sleep apnea (adult) (pediatric): Secondary | ICD-10-CM

## 2014-02-20 NOTE — Progress Notes (Signed)
PFT done today. 

## 2014-02-20 NOTE — Progress Notes (Signed)
Chief Complaint  Patient presents with  . Follow-up    Review PFT. Denies changes in breathing since last OV. Pt concerned with some low HR levels 40's-50's at times.    History of Present Illness: [name] is a 80 y.o. male former smoker with dypspnea.  He has COPD with asthma, CAD, and OSA.  He had PFT earlier today.  This showed mild/moderate obstruction, normal lung volumes, mild diffusion defect and positive bronchodilator response.  He also had sleep study on 01/02/14 which showed moderate sleep apnea.  His breathing has been stable.  He is using symbicort daily.  He is not having as much cough or sputum.  He denies leg swelling.  TESTS: PSG 01/02/14 >> AHI 15.8, SaO2 low 83%, PLMI 36.7. Echo 02/15/14 >> mod LVH, EF 25%, grade 1 diastolic dysfx PFT 9/56/38 >> FEV1 2.11 (69%), FEV1% 67, TLC 6.79 (93%), DLCO 66%, +BD  [name]  has a past medical history of Dizziness; Fatigue; HTN (hypertension); Vertigo; AAA (abdominal aortic aneurysm); Dyslipidemia; CAD (coronary artery disease); Elevated glucose; Headache(784.0); Ejection fraction; Prostate cancer; Pneumonia (1940's); GERD (gastroesophageal reflux disease); Arthritis; CVA (cerebral vascular accident); CABG; Thrombocytopenia; Carotid artery disease; Itching; and Hyperbilirubinemia.  [name]  has past surgical history that includes Abdominal aortic aneurysm repair (~ 2000); Femoral artery aneurysm repair (~ 2000); Cholecystectomy; Knee surgery (2009); Inguinal hernia repair; and Coronary artery bypass graft (08/21/2011).  Prior to Admission medications   Medication Sig Start Date End Date Taking? Authorizing Provider  aspirin 81 MG tablet Take 81 mg by mouth daily.   Yes Historical Provider, MD  BAYER CONTOUR TEST test strip USE ONE STRIP TWICE DAILY TO TEST BLOOD  SUGAR LEVELS   Yes [name] [name], [name]  budesonide-formoterol (SYMBICORT) 160-4.5 MCG/ACT inhaler Inhale 2 puffs into the lungs 2 (two) times daily.  03/10/13  Yes [name] [name], [name]  losartan (COZAAR) 50 MG tablet Take 25 mg by mouth daily. 01/30/14  Yes Carlena Bjornstad, MD  metFORMIN (GLUCOPHAGE) 500 MG tablet Take 1 tablet (500 mg total) by mouth 2 (two) times daily with a meal.   Yes Mae Loree Fee, [name]  naproxen sodium (ANAPROX) 220 MG tablet Take 220 mg by mouth 2 (two) times daily with a meal.   Yes Historical Provider, MD  rosuvastatin (CRESTOR) 10 MG tablet Take 10 mg by mouth every other day. 03/10/13 08/17/14 Yes [name] [name], [name]    Allergies  Allergen Reactions  . Erythromycin   . Penicillins   . Ketorolac Tromethamine Rash     Physical Exam:  General - No distress ENT - No sinus tenderness, no oral exudate, no LAN, MP 4 Cardiac - s1s2 regular, no murmur Chest - No wheeze/rales/dullness Back - No focal tenderness Abd - Soft, non-tender Ext - No edema Neuro - Normal strength Skin - No rashes Psych - normal mood, and behavior   Assessment/Plan:  Chesley Mires, MD  Pulmonary/Critical Care/Sleep Pager:  [phone]

## 2014-02-20 NOTE — Patient Instructions (Signed)
Will schedule CPAP titration study Follow up in 3 months

## 2014-02-21 ENCOUNTER — Encounter: Payer: Self-pay | Admitting: Cardiology

## 2014-02-21 ENCOUNTER — Encounter: Payer: Self-pay | Admitting: Pulmonary Disease

## 2014-02-21 DIAGNOSIS — J4489 Other specified chronic obstructive pulmonary disease: Secondary | ICD-10-CM

## 2014-02-21 DIAGNOSIS — J449 Chronic obstructive pulmonary disease, unspecified: Secondary | ICD-10-CM | POA: Insufficient documentation

## 2014-02-21 HISTORY — DX: Other specified chronic obstructive pulmonary disease: J44.89

## 2014-02-21 HISTORY — DX: Chronic obstructive pulmonary disease, unspecified: J44.9

## 2014-02-21 NOTE — Assessment & Plan Note (Signed)
Related to COPD/asthma, diastolic dysfx, and deconditioning.  Improved since last visit.

## 2014-02-21 NOTE — Assessment & Plan Note (Signed)
He has moderate OSA.  He also has hx of COPD.  I have reviewed the recent sleep study results with the patient.  We discussed how sleep apnea can affect various health problems including risks for hypertension, cardiovascular disease, and diabetes.  We also discussed how sleep disruption can increase risks for accident, such as while driving.  Weight loss as a means of improving sleep apnea was also reviewed.  Additional treatment options discussed were CPAP therapy, oral appliance, and surgical intervention.  Will arrange for in lab CPAP titration study.

## 2014-02-21 NOTE — Assessment & Plan Note (Addendum)
He is doing well on current inhaler regimen of symbicort and prn albuterol.  Again discussed how environmental exposures could contribute to his symptoms.

## 2014-02-22 ENCOUNTER — Telehealth: Payer: Self-pay | Admitting: *Deleted

## 2014-02-22 NOTE — Telephone Encounter (Signed)
Wife informed

## 2014-03-02 ENCOUNTER — Encounter: Payer: Self-pay | Admitting: Cardiology

## 2014-03-02 NOTE — Progress Notes (Signed)
Patient ID: [name], male   DOB: Aug 14, 1933, 80 y.o.   MRN: [id number]  I saw the patient in the office on January 27, 2014. He was  being evaluated for shortness of breath. Decision was made to proceed with a 2-D echo. This was done on February 15, 2014. It shows an ejection fraction of 50%. The patient has also been seen by Dr. Halford Chessman for pulmonary evaluation since then. It is mentioned in the notes that the patient will undergo CPAP titration. A hand written note from the patient's family member [name] was made available to me today in the [place] office. Unfortunately it is delayed as it is dated February 16, 2014. I think that the family member must have been watching the monitor when the echo was done. She noted that at times the heart rate dropped into the 40s with different types of P waves. She thought that there may have been ventricular escape beats. Patient is not on a beta blocker. She wonders whether the patient should be worked up further for his heart rate.  I will talk with our office and arrange for further followup of the patient to help assess the issues outlined. I will also fax back to handwritten note to our office.Daryel November, MD

## 2014-03-03 ENCOUNTER — Ambulatory Visit (INDEPENDENT_AMBULATORY_CARE_PROVIDER_SITE_OTHER): Payer: Commercial Managed Care - HMO | Admitting: Cardiology

## 2014-03-03 ENCOUNTER — Encounter: Payer: Self-pay | Admitting: Cardiology

## 2014-03-03 ENCOUNTER — Other Ambulatory Visit: Payer: Self-pay | Admitting: *Deleted

## 2014-03-03 VITALS — BP 117/66 | HR 65 | Ht 71.0 in | Wt 216.1 lb

## 2014-03-03 DIAGNOSIS — I498 Other specified cardiac arrhythmias: Secondary | ICD-10-CM

## 2014-03-03 DIAGNOSIS — R42 Dizziness and giddiness: Secondary | ICD-10-CM

## 2014-03-03 DIAGNOSIS — R001 Bradycardia, unspecified: Secondary | ICD-10-CM

## 2014-03-03 NOTE — Progress Notes (Signed)
Clinical Summary Mr. Bostic is a 80 y.o.male regular patient of Dr Ron Parker, he is seen today as an add on patient.   1. Bradycardia - from notes patient is being evaluated by both Dr Ron Parker and pulmonary for recent SOB and DOE. - echo 01/2014 showed LVEF 56%, grade I diastolic dysfunction, mild hypokinesis of basal-mid inferior wall.  - during echo, patient's family member who is a cardiac nurse at [facility] noted slow heart rates on the monitor. Rates noted in the 40s, with some question of some potential block or pauses - reviewing old EKGs show evidence of LAFB, 1st degree AV block.   - reports dizzy spells at times. Reports beta blocker stopped sometime ago. Still with some dizziness after. Overall rare episodes of dizziness, typically occurs while standing. After dizzy spells can feel weak for few days, though has not had for awhile. No syncope.   - awaiting sleep study, being evaluated for CPAP.  Past Medical History  Diagnosis Date  . Dizziness   . Fatigue     chronic  . HTN (hypertension)   . Vertigo   . AAA (abdominal aortic aneurysm)     Surgery Dr [name] 2000. /  Ultrasound October, 2012, no significant abnormality, technically difficult  . Dyslipidemia     Triglycerides elevated  . CAD (coronary artery disease)     05/2011 Nuclear normal  /  chest pain December, 2012, CABG  . Elevated glucose   . DJSHFWYO(378.5)     October, 2012  . Ejection fraction     EF normal, nuclear, October, 2012  . Prostate cancer     Dr.Wrenn; S/P radiation  . Pneumonia 1940's  . GERD (gastroesophageal reflux disease)   . Arthritis   . CVA (cerebral vascular accident)     Old left frontal infarct by MRI 2008  . Hx of CABG     August 21, 2011, Dr. [name], LIMA to distal LAD, SVG acute marginal of RCA, SVG to diagonal  . Thrombocytopenia     Bone marrow biopsy August 20, 2011  . Carotid artery disease     Doppler, hospital, December, 2012, no significant  carotid stenoses  . Itching    May, 2013  . Hyperbilirubinemia     January, 2014.Marland KitchenMarland KitchenDr [name]  . COPD with asthma 02/21/2014  . OSA (obstructive sleep apnea) 12/07/2013     Allergies  Allergen Reactions  . Erythromycin   . Penicillins   . Ketorolac Tromethamine Rash     Current Outpatient Prescriptions  Medication Sig Dispense Refill  . aspirin 81 MG tablet Take 81 mg by mouth daily.      [name] BAYER CONTOUR TEST test strip USE ONE STRIP TWICE DAILY TO TEST BLOOD  SUGAR LEVELS  100 each  0  . budesonide-formoterol (SYMBICORT) 160-4.5 MCG/ACT inhaler Inhale 2 puffs into the lungs 2 (two) times daily.  1 Inhaler  5  . losartan (COZAAR) 50 MG tablet Take 25 mg by mouth daily.      . metFORMIN (GLUCOPHAGE) 500 MG tablet Take 1 tablet (500 mg total) by mouth 2 (two) times daily with a meal.  60 tablet  0  . naproxen sodium (ANAPROX) 220 MG tablet Take 220 mg by mouth 2 (two) times daily with a meal.      . rosuvastatin (CRESTOR) 10 MG tablet Take 10 mg by mouth every other day.       No current facility-administered medications for this visit.     Past  Surgical History  Procedure Laterality Date  . Abdominal aortic aneurysm repair  ~ 2000  . Femoral artery aneurysm repair  ~ 2000  . Cholecystectomy    . Knee surgery  2009    left; repair  . Inguinal hernia repair      X3?  [name] Coronary artery bypass graft  08/21/2011    Procedure: CORONARY ARTERY BYPASS GRAFTING (CABG);  Surgeon: [name], MD;  Location: [facility];  Service: Open Heart Surgery;  Laterality: N/A;  Coronary Artery Bypass graft on pump times three utlizing the left internal mammary artery and right greater saphenous vein harvested endoscopically     Allergies  Allergen Reactions  . Erythromycin   . Penicillins   . Ketorolac Tromethamine Rash      Family History  Problem Relation Age of Onset  . Heart attack Mother   . Heart attack Father   . Heart attack Brother   . Prostate cancer Brother   . Colon cancer Brother      Social  History Mr. Mangel reports that he quit smoking about 15 years ago. His smoking use included Cigarettes. He has a 150 pack-year smoking history. He has quit using smokeless tobacco. His smokeless tobacco use included Chew. Mr. Croom reports that he does not drink alcohol.   Review of Systems CONSTITUTIONAL: No weight loss, fever, chills, weakness or fatigue.  HEENT: Eyes: No visual loss, blurred vision, double vision or yellow sclerae.No hearing loss, sneezing, congestion, runny nose or sore throat.  SKIN: No rash or itching.  CARDIOVASCULAR: no chest pain RESPIRATORY: No shortness of breath, cough or sputum.  GASTROINTESTINAL: No anorexia, nausea, vomiting or diarrhea. No abdominal pain or blood.  GENITOURINARY: No burning on urination, no polyuria NEUROLOGICAL: occasional dizziness MUSCULOSKELETAL: No muscle, back pain, joint pain or stiffness.  LYMPHATICS: No enlarged nodes. No history of splenectomy.  PSYCHIATRIC: No history of depression or anxiety.  ENDOCRINOLOGIC: No reports of sweating, cold or heat intolerance. No polyuria or polydipsia.  [name]   Physical Examination p 65 bp 117/66 Wt 216 lbs BMI 30 Gen: resting comfortably, no acute distress HEENT: no scleral icterus, pupils equal round and reactive, no palptable cervical adenopathy,  CV: RRR, no m/r/g,no JVD Resp: Clear to auscultation bilaterally GI: abdomen is soft, non-tender, non-distended, normal bowel sounds, no hepatosplenomegaly MSK: extremities are warm, no edema.  Skin: warm, no rash Neuro:  no focal deficits Psych: appropriate affect   Diagnostic Studies 01/2014 Echo Study Conclusions  - Left ventricle: The cavity size was normal. Wall thickness was increased in a pattern of moderate LVH. Systolic function was low normal, with mild global hypokinesis. The estimated ejection fraction was approximately 50%. There was an increased relative contribution of atrial contraction to ventricular filling. Doppler  parameters are consistent with abnormal left ventricular relaxation (grade 1 diastolic dysfunction). - Regional wall motion abnormality: Mild hypokinesis of the basal-mid inferior myocardium. - Aortic valve: Mildly thickened, mildly calcified leaflets. There was mild regurgitation.      Assessment and Plan  1. Bradycardia - reports occasional episodes of dizziness. His daughter who is a cardiac nurse noted some low heart rates during recent echo on monitor, with questions of some form of block or pauses. - he does have some underlying conduction disease evident by LAFB and 1st degree AV block - he is currently not on any AV nodal agents - will obtain 48 hr holter monitor to further evaluate   2. HTN - bp on average at goal according  to home numbers, continue current meds   F/u with either myself or Dr Ron Parker depending on availability pending holter results.       Arnoldo Lenis, M.D., F.A.C.C.

## 2014-03-03 NOTE — Patient Instructions (Signed)
Your physician has recommended that you wear a 48 hour holter monitor. Holter monitors are medical devices that record the heart's electrical activity. Doctors most often use these monitors to diagnose arrhythmias. Arrhythmias are problems with the speed or rhythm of the heartbeat. The monitor is a small, portable device. You can wear one while you do your normal daily activities. This is usually used to diagnose what is causing palpitations/syncope (passing out). Office will contact with results via phone or letter.   Continue all medications.   Keep already scheduled follow up with Dr. Ron Parker for August

## 2014-03-07 ENCOUNTER — Ambulatory Visit (HOSPITAL_BASED_OUTPATIENT_CLINIC_OR_DEPARTMENT_OTHER): Payer: Medicare HMO | Attending: Pulmonary Disease

## 2014-03-07 VITALS — Ht 71.0 in | Wt 214.0 lb

## 2014-03-07 DIAGNOSIS — Z9989 Dependence on other enabling machines and devices: Secondary | ICD-10-CM

## 2014-03-07 DIAGNOSIS — J449 Chronic obstructive pulmonary disease, unspecified: Secondary | ICD-10-CM | POA: Insufficient documentation

## 2014-03-07 DIAGNOSIS — J410 Simple chronic bronchitis: Secondary | ICD-10-CM

## 2014-03-07 DIAGNOSIS — G4733 Obstructive sleep apnea (adult) (pediatric): Secondary | ICD-10-CM

## 2014-03-07 DIAGNOSIS — G4761 Periodic limb movement disorder: Secondary | ICD-10-CM | POA: Insufficient documentation

## 2014-03-07 DIAGNOSIS — I2510 Atherosclerotic heart disease of native coronary artery without angina pectoris: Secondary | ICD-10-CM | POA: Insufficient documentation

## 2014-03-07 DIAGNOSIS — Z79899 Other long term (current) drug therapy: Secondary | ICD-10-CM | POA: Insufficient documentation

## 2014-03-07 DIAGNOSIS — J4489 Other specified chronic obstructive pulmonary disease: Secondary | ICD-10-CM | POA: Insufficient documentation

## 2014-03-07 DIAGNOSIS — I4949 Other premature depolarization: Secondary | ICD-10-CM | POA: Insufficient documentation

## 2014-03-08 NOTE — Addendum Note (Signed)
Addended by: Felipa Eth on: 03/08/2014 05:45 AM   Modules accepted: Level of Service

## 2014-03-09 ENCOUNTER — Telehealth: Payer: Self-pay | Admitting: Pulmonary Disease

## 2014-03-09 DIAGNOSIS — G4730 Sleep apnea, unspecified: Secondary | ICD-10-CM

## 2014-03-09 DIAGNOSIS — G4733 Obstructive sleep apnea (adult) (pediatric): Secondary | ICD-10-CM

## 2014-03-09 DIAGNOSIS — G4710 Hypersomnia, unspecified: Secondary | ICD-10-CM

## 2014-03-09 NOTE — Telephone Encounter (Signed)
CPAP titration 03/07/14 >> CPAP 9 cm H2O >> AHI 4.4, PLMI 62.9.   Will have my nurse inform pt that he did well with CPAP during sleep study.  Please send order for him to be started on CPAP 9 cm H2O with heated humidity and mask of choice.  Please have download sent 4 weeks after starting CPAP, and schedule ROV for September 2015.

## 2014-03-09 NOTE — Telephone Encounter (Signed)
Spoke with pt wife--states she will relay all to husband  Aware of results of sleep study and CPAP tolerance Order placed to start CPAP with mask of choice. Download in 4 weeks requested in order as well. Pt does not have a DME--needs one chosen for them  Rov 82mo CPAP compliance set up for 9/23 at 1:45 with Dr Halford Chessman.  Nothing further needed.

## 2014-03-09 NOTE — Sleep Study (Signed)
[place]  NAME: [name] DATE OF BIRTH:  03/06/1933 MEDICAL RECORD NUMBER [id number]  LOCATION: [facility]  PHYSICIAN: Chesley Mires, M.D. DATE OF STUDY: 03/07/2014  SLEEP STUDY TYPE: CPAP titration               REFERRING PHYSICIAN: Chesley Mires, MD  INDICATION FOR STUDY:  [name] is a 80 y.o. male who presents for a CPAP titration study.  He has sleep study from 01/02/14 which showed an AHI of 15.8 and SaO2 low of 83%.  He has history of COPD with asthma and CAD.  EPWORTH SLEEPINESS SCORE: 5. HEIGHT: 5\' 11"  (180.3 cm)  WEIGHT: 214 lb (97.07 kg)    Body mass index is 29.86 kg/(m^2).  NECK SIZE: 17 in.  MEDICATIONS:  Current Outpatient Prescriptions on File Prior to Visit  Medication Sig Dispense Refill  . aspirin 81 MG tablet Take 81 mg by mouth daily.      [name] BAYER CONTOUR TEST test strip USE ONE STRIP TWICE DAILY TO TEST BLOOD  SUGAR LEVELS  100 each  0  . budesonide-formoterol (SYMBICORT) 160-4.5 MCG/ACT inhaler Inhale 2 puffs into the lungs 2 (two) times daily.  1 Inhaler  5  . losartan (COZAAR) 50 MG tablet Take 25 mg by mouth daily.      . metFORMIN (GLUCOPHAGE) 500 MG tablet Take 1 tablet (500 mg total) by mouth 2 (two) times daily with a meal.  60 tablet  0  . naproxen sodium (ANAPROX) 220 MG tablet Take 220 mg by mouth 2 (two) times daily with a meal.      . rosuvastatin (CRESTOR) 10 MG tablet Take 10 mg by mouth every other day.       No current facility-administered medications on file prior to visit.    SLEEP ARCHITECTURE:  Total recording time: 389.5 minutes.  Total sleep time was: 327 minutes.  Sleep efficiency: 84%.  Sleep latency: 35 minutes.  REM latency: 79 minutes.  Stage N1: 13%.  Stage N2: 75.2%.  Stage N3: 0%.  Stage R:  11.8%.  Supine sleep: 7.5 minutes.  Non-supine sleep: 319.5 minutes.  CARDIAC DATA:  Average heart rate: 55 beats per minute. Rhythm strip: sinus rhythm with occasional  PVC's.  RESPIRATORY DATA: Average respiratory rate: 15.  He was started on CPAP 4 and increased to 9 cm H2O.  With CPAP at 9 cm H2O his AHI was reduced to 4.4.  With this pressure setting he was observed in REM sleep.  MOVEMENT/PARASOMNIA:  Periodic limb movement: 62.9.  Period limb movements with arousals: 1.8. Restroom trips: none.  OXYGEN DATA:  Baseline oxygenation: 95%. Lowest SaO2: 89%. Time spent below SaO2 90%: 1 minutes. Supplemental oxygen used: none.  IMPRESSION/ RECOMMENDATION:   This was a successful titration study.    He did well with CPAP 9 cm H2O.  He was fitted with a Fisher Paykel medium sized Simplus full face mask.  He had an increase in his periodic limb movement index.  Chesley Mires, M.D. Diplomate, Tax adviser of Sleep Medicine  ELECTRONICALLY SIGNED ON:  03/09/2014, 11:53 AM [place] PH: [phone]   FX: [phone] [place]

## 2014-03-16 ENCOUNTER — Telehealth: Payer: Self-pay | Admitting: Pulmonary Disease

## 2014-03-16 NOTE — Telephone Encounter (Signed)
Called [facility] and spoke with June as Rip Harbour was no available and VM full. She reports per the notes they faxed an authorization that needed to be signed by VS. Wants to know if we have this. Please advise PCC's thanks

## 2014-03-17 NOTE — Telephone Encounter (Signed)
Unable to reach melinda by phone faxed info to find out exactly what is needed on this form [name]

## 2014-03-22 LAB — PULMONARY FUNCTION TEST
DL/VA % pred: 91 %
DL/VA: 4.22 ml/min/mmHg/L
DLCO UNC % PRED: 66 %
DLCO UNC: 22.51 ml/min/mmHg
FEF 25-75 Post: 1.61 L/sec
FEF 25-75 Pre: 0.95 L/sec
FEF2575-%Change-Post: 69 %
FEF2575-%Pred-Post: 76 %
FEF2575-%Pred-Pre: 44 %
FEV1-%Change-Post: 14 %
FEV1-%PRED-POST: 69 %
FEV1-%Pred-Pre: 60 %
FEV1-POST: 2.11 L
FEV1-Pre: 1.85 L
FEV1FVC-%CHANGE-POST: 1 %
FEV1FVC-%Pred-Pre: 92 %
FEV6-%CHANGE-POST: 12 %
FEV6-%Pred-Post: 77 %
FEV6-%Pred-Pre: 69 %
FEV6-PRE: 2.74 L
FEV6-Post: 3.09 L
FEV6FVC-%CHANGE-POST: 0 %
FEV6FVC-%Pred-Post: 104 %
FEV6FVC-%Pred-Pre: 104 %
FVC-%CHANGE-POST: 12 %
FVC-%PRED-POST: 74 %
FVC-%PRED-PRE: 65 %
FVC-Post: 3.15 L
FVC-Pre: 2.79 L
POST FEV6/FVC RATIO: 98 %
PRE FEV1/FVC RATIO: 66 %
PRE FEV6/FVC RATIO: 98 %
Post FEV1/FVC ratio: 67 %
RV % PRED: 138 %
RV: 3.74 L
TLC % PRED: 93 %
TLC: 6.79 L

## 2014-03-25 ENCOUNTER — Other Ambulatory Visit: Payer: Self-pay | Admitting: Nurse Practitioner

## 2014-03-28 ENCOUNTER — Other Ambulatory Visit: Payer: Self-pay

## 2014-03-28 ENCOUNTER — Emergency Department (HOSPITAL_COMMUNITY): Payer: Medicare HMO

## 2014-03-28 ENCOUNTER — Observation Stay (HOSPITAL_COMMUNITY)
Admission: EM | Admit: 2014-03-28 | Discharge: 2014-03-30 | Disposition: A | Discharge status: Home / Self Care | Payer: Medicare HMO | Attending: Cardiology | Admitting: Cardiology

## 2014-03-28 ENCOUNTER — Encounter (HOSPITAL_COMMUNITY): Payer: Self-pay | Admitting: Emergency Medicine

## 2014-03-28 DIAGNOSIS — I639 Cerebral infarction, unspecified: Secondary | ICD-10-CM | POA: Diagnosis present

## 2014-03-28 DIAGNOSIS — Z923 Personal history of irradiation: Secondary | ICD-10-CM | POA: Diagnosis not present

## 2014-03-28 DIAGNOSIS — R001 Bradycardia, unspecified: Secondary | ICD-10-CM | POA: Diagnosis present

## 2014-03-28 DIAGNOSIS — I5189 Other ill-defined heart diseases: Secondary | ICD-10-CM | POA: Diagnosis present

## 2014-03-28 DIAGNOSIS — I209 Angina pectoris, unspecified: Secondary | ICD-10-CM

## 2014-03-28 DIAGNOSIS — E669 Obesity, unspecified: Secondary | ICD-10-CM | POA: Diagnosis present

## 2014-03-28 DIAGNOSIS — Z8679 Personal history of other diseases of the circulatory system: Secondary | ICD-10-CM

## 2014-03-28 DIAGNOSIS — I2510 Atherosclerotic heart disease of native coronary artery without angina pectoris: Secondary | ICD-10-CM | POA: Diagnosis not present

## 2014-03-28 DIAGNOSIS — R42 Dizziness and giddiness: Secondary | ICD-10-CM | POA: Diagnosis present

## 2014-03-28 DIAGNOSIS — Z951 Presence of aortocoronary bypass graft: Secondary | ICD-10-CM

## 2014-03-28 DIAGNOSIS — I25810 Atherosclerosis of coronary artery bypass graft(s) without angina pectoris: Secondary | ICD-10-CM | POA: Diagnosis not present

## 2014-03-28 DIAGNOSIS — Z6829 Body mass index (BMI) 29.0-29.9, adult: Secondary | ICD-10-CM | POA: Diagnosis not present

## 2014-03-28 DIAGNOSIS — K219 Gastro-esophageal reflux disease without esophagitis: Secondary | ICD-10-CM | POA: Diagnosis not present

## 2014-03-28 DIAGNOSIS — G4733 Obstructive sleep apnea (adult) (pediatric): Secondary | ICD-10-CM | POA: Diagnosis not present

## 2014-03-28 DIAGNOSIS — R0989 Other specified symptoms and signs involving the circulatory and respiratory systems: Secondary | ICD-10-CM | POA: Insufficient documentation

## 2014-03-28 DIAGNOSIS — Z8673 Personal history of transient ischemic attack (TIA), and cerebral infarction without residual deficits: Secondary | ICD-10-CM | POA: Diagnosis not present

## 2014-03-28 DIAGNOSIS — I519 Heart disease, unspecified: Secondary | ICD-10-CM | POA: Diagnosis not present

## 2014-03-28 DIAGNOSIS — H8190 Unspecified disorder of vestibular function, unspecified ear: Secondary | ICD-10-CM | POA: Diagnosis present

## 2014-03-28 DIAGNOSIS — E119 Type 2 diabetes mellitus without complications: Secondary | ICD-10-CM | POA: Diagnosis present

## 2014-03-28 DIAGNOSIS — Z7982 Long term (current) use of aspirin: Secondary | ICD-10-CM | POA: Diagnosis not present

## 2014-03-28 DIAGNOSIS — D696 Thrombocytopenia, unspecified: Secondary | ICD-10-CM | POA: Diagnosis present

## 2014-03-28 DIAGNOSIS — J449 Chronic obstructive pulmonary disease, unspecified: Secondary | ICD-10-CM | POA: Diagnosis not present

## 2014-03-28 DIAGNOSIS — Z9889 Other specified postprocedural states: Secondary | ICD-10-CM

## 2014-03-28 DIAGNOSIS — Z87891 Personal history of nicotine dependence: Secondary | ICD-10-CM | POA: Insufficient documentation

## 2014-03-28 DIAGNOSIS — E785 Hyperlipidemia, unspecified: Secondary | ICD-10-CM | POA: Diagnosis not present

## 2014-03-28 DIAGNOSIS — Z8546 Personal history of malignant neoplasm of prostate: Secondary | ICD-10-CM | POA: Diagnosis not present

## 2014-03-28 DIAGNOSIS — H838X9 Other specified diseases of inner ear, unspecified ear: Principal | ICD-10-CM | POA: Insufficient documentation

## 2014-03-28 DIAGNOSIS — R55 Syncope and collapse: Secondary | ICD-10-CM

## 2014-03-28 DIAGNOSIS — I119 Hypertensive heart disease without heart failure: Secondary | ICD-10-CM | POA: Diagnosis not present

## 2014-03-28 DIAGNOSIS — I25118 Atherosclerotic heart disease of native coronary artery with other forms of angina pectoris: Secondary | ICD-10-CM

## 2014-03-28 DIAGNOSIS — R0609 Other forms of dyspnea: Secondary | ICD-10-CM | POA: Diagnosis present

## 2014-03-28 DIAGNOSIS — R079 Chest pain, unspecified: Secondary | ICD-10-CM | POA: Diagnosis present

## 2014-03-28 DIAGNOSIS — C61 Malignant neoplasm of prostate: Secondary | ICD-10-CM | POA: Diagnosis present

## 2014-03-28 DIAGNOSIS — IMO0002 Reserved for concepts with insufficient information to code with codable children: Secondary | ICD-10-CM | POA: Diagnosis present

## 2014-03-28 DIAGNOSIS — Z95828 Presence of other vascular implants and grafts: Secondary | ICD-10-CM

## 2014-03-28 DIAGNOSIS — R9430 Abnormal result of cardiovascular function study, unspecified: Secondary | ICD-10-CM

## 2014-03-28 DIAGNOSIS — J4489 Other specified chronic obstructive pulmonary disease: Secondary | ICD-10-CM | POA: Insufficient documentation

## 2014-03-28 DIAGNOSIS — R0600 Dyspnea, unspecified: Secondary | ICD-10-CM | POA: Diagnosis present

## 2014-03-28 DIAGNOSIS — I498 Other specified cardiac arrhythmias: Secondary | ICD-10-CM

## 2014-03-28 DIAGNOSIS — E118 Type 2 diabetes mellitus with unspecified complications: Secondary | ICD-10-CM

## 2014-03-28 HISTORY — DX: Calculus of kidney: N20.0

## 2014-03-28 HISTORY — DX: Personal history of other medical treatment: Z92.89

## 2014-03-28 HISTORY — DX: Type 2 diabetes mellitus without complications: E11.9

## 2014-03-28 LAB — CBC
HCT: 42.5 % (ref 39.0–52.0)
Hemoglobin: 14.3 g/dL (ref 13.0–17.0)
MCH: 32.3 pg (ref 26.0–34.0)
MCHC: 33.6 g/dL (ref 30.0–36.0)
MCV: 95.9 fL (ref 78.0–100.0)
PLATELETS: 79 10*3/uL — AB (ref 150–400)
RBC: 4.43 MIL/uL (ref 4.22–5.81)
RDW: 14.6 % (ref 11.5–15.5)
WBC: 5.6 10*3/uL (ref 4.0–10.5)

## 2014-03-28 LAB — URINALYSIS, ROUTINE W REFLEX MICROSCOPIC
BILIRUBIN URINE: NEGATIVE
Glucose, UA: NEGATIVE mg/dL
HGB URINE DIPSTICK: NEGATIVE
Ketones, ur: NEGATIVE mg/dL
Leukocytes, UA: NEGATIVE
NITRITE: NEGATIVE
PROTEIN: NEGATIVE mg/dL
SPECIFIC GRAVITY, URINE: 1.015 (ref 1.005–1.030)
Urobilinogen, UA: 1 mg/dL (ref 0.0–1.0)
pH: 7 (ref 5.0–8.0)

## 2014-03-28 LAB — COMPREHENSIVE METABOLIC PANEL WITH GFR
ALT: 19 U/L (ref 0–53)
AST: 19 U/L (ref 0–37)
Albumin: 3.9 g/dL (ref 3.5–5.2)
Alkaline Phosphatase: 67 U/L (ref 39–117)
Anion gap: 10 (ref 5–15)
BUN: 16 mg/dL (ref 6–23)
CO2: 27 meq/L (ref 19–32)
Calcium: 9.1 mg/dL (ref 8.4–10.5)
Chloride: 105 meq/L (ref 96–112)
Creatinine, Ser: 0.87 mg/dL (ref 0.50–1.35)
GFR calc Af Amer: >90 mL/min
GFR calc non Af Amer: 80 mL/min — ABNORMAL LOW
Glucose, Bld: 176 mg/dL — ABNORMAL HIGH (ref 70–99)
Potassium: 4.6 meq/L (ref 3.7–5.3)
Sodium: 142 meq/L (ref 137–147)
Total Bilirubin: 1.1 mg/dL (ref 0.3–1.2)
Total Protein: 6.4 g/dL (ref 6.0–8.3)

## 2014-03-28 LAB — GLUCOSE, CAPILLARY: Glucose-Capillary: 136 mg/dL — ABNORMAL HIGH (ref 70–99)

## 2014-03-28 LAB — PRO B NATRIURETIC PEPTIDE: Pro B Natriuretic peptide (BNP): 286.3 pg/mL (ref 0–450)

## 2014-03-28 LAB — TROPONIN I
Troponin I: <0.3 ng/mL (ref <0.30)
Troponin I: <0.3 ng/mL (ref <0.30)

## 2014-03-28 LAB — D-DIMER, QUANTITATIVE: D-Dimer, Quant: 0.97 ug{FEU}/mL — ABNORMAL HIGH (ref 0.00–0.48)

## 2014-03-28 MED ORDER — INSULIN ASPART 100 UNIT/ML ~~LOC~~ SOLN
0.0000 [IU] | Freq: Three times a day (TID) | SUBCUTANEOUS | Status: DC
  Administered 2014-03-28: 2 [IU] via SUBCUTANEOUS
  Administered 2014-03-29: 3 [IU] via SUBCUTANEOUS
  Administered 2014-03-30: 13:00:00 via SUBCUTANEOUS

## 2014-03-28 MED ORDER — ALPRAZOLAM 0.25 MG PO TABS: 0.2500 mg | ORAL_TABLET | Freq: Two times a day (BID) | ORAL | Status: DC | PRN

## 2014-03-28 MED ORDER — ONDANSETRON HCL 4 MG/2ML IJ SOLN
4.0000 mg | Freq: Four times a day (QID) | INTRAMUSCULAR | Status: DC | PRN
Start: 2014-03-28 — End: 2014-03-30

## 2014-03-28 MED ORDER — INSULIN ASPART 100 UNIT/ML ~~LOC~~ SOLN: 0.0000 [IU] | Freq: Every day | SUBCUTANEOUS | Status: DC

## 2014-03-28 MED ORDER — ENOXAPARIN SODIUM 40 MG/0.4ML ~~LOC~~ SOLN
40.0000 mg | SUBCUTANEOUS | Status: DC
  Administered 2014-03-28 – 2014-03-29 (×2): 40 mg via SUBCUTANEOUS
  Filled 2014-03-28 (×3): qty 0.4

## 2014-03-28 MED ORDER — SODIUM CHLORIDE 0.9 % IJ SOLN
3.0000 mL | Freq: Two times a day (BID) | INTRAMUSCULAR | Status: DC
  Administered 2014-03-30: 12:00:00 via INTRAVENOUS

## 2014-03-28 MED ORDER — ACETAMINOPHEN 325 MG PO TABS: 650.0000 mg | ORAL_TABLET | ORAL | Status: DC | PRN

## 2014-03-28 MED ORDER — MECLIZINE HCL 25 MG PO TABS
12.5000 mg | ORAL_TABLET | Freq: Once | ORAL | Status: AC
  Administered 2014-03-28: 12.5 mg via ORAL
  Filled 2014-03-28: qty 1

## 2014-03-28 MED ORDER — LOSARTAN POTASSIUM 25 MG PO TABS
25.0000 mg | ORAL_TABLET | Freq: Every day | ORAL | Status: DC
  Administered 2014-03-28 – 2014-03-30 (×3): 25 mg via ORAL
  Filled 2014-03-28 (×3): qty 1

## 2014-03-28 MED ORDER — ZOLPIDEM TARTRATE 5 MG PO TABS: 5.0000 mg | ORAL_TABLET | Freq: Every evening | ORAL | Status: DC | PRN

## 2014-03-28 MED ORDER — SODIUM CHLORIDE 0.9 % IV SOLN
INTRAVENOUS | Status: DC
  Administered 2014-03-28: 20 mL/h via INTRAVENOUS

## 2014-03-28 MED ORDER — SODIUM CHLORIDE 0.9 % IV SOLN: 250.0000 mL | INTRAVENOUS | Status: DC | PRN

## 2014-03-28 MED ORDER — BUDESONIDE-FORMOTEROL FUMARATE 160-4.5 MCG/ACT IN AERO
2.0000 | INHALATION_SPRAY | Freq: Two times a day (BID) | RESPIRATORY_TRACT | Status: DC
  Administered 2014-03-29 – 2014-03-30 (×2): 2 via RESPIRATORY_TRACT
  Filled 2014-03-28: qty 6

## 2014-03-28 MED ORDER — SODIUM CHLORIDE 0.9 % IJ SOLN: 3.0000 mL | INTRAMUSCULAR | Status: DC | PRN

## 2014-03-28 MED ORDER — MECLIZINE HCL 12.5 MG PO TABS
12.5000 mg | ORAL_TABLET | Freq: Three times a day (TID) | ORAL | Status: DC | PRN
  Filled 2014-03-28: qty 1

## 2014-03-28 MED ORDER — ATORVASTATIN CALCIUM 20 MG PO TABS
20.0000 mg | ORAL_TABLET | Freq: Every day | ORAL | Status: DC
  Administered 2014-03-28 – 2014-03-29 (×2): 20 mg via ORAL
  Filled 2014-03-28 (×3): qty 1

## 2014-03-28 MED ORDER — NITROGLYCERIN 0.4 MG SL SUBL
0.4000 mg | SUBLINGUAL_TABLET | SUBLINGUAL | Status: DC | PRN
  Administered 2014-03-28: 0.4 mg via SUBLINGUAL
  Filled 2014-03-28: qty 1

## 2014-03-28 MED ORDER — ASPIRIN EC 81 MG PO TBEC
81.0000 mg | DELAYED_RELEASE_TABLET | Freq: Every day | ORAL | Status: DC
  Administered 2014-03-29: 81 mg via ORAL
  Filled 2014-03-28: qty 1

## 2014-03-28 NOTE — ED Provider Notes (Signed)
CSN: [id number]     Arrival date & time 03/28/14  0850 History   First MD Initiated Contact with Patient 03/28/14 [id number]     Chief Complaint  Patient presents with  . Dizziness  . Chest Pain  pt c/o acute onset lightheadedness/faintess this morning.  States he had gotten up to go to bathroom, was walking back and felt generally weak, faint, dizzy, as if was about to pass out.  No associated chest pain. No palpitations. No sob. No headaches. States several other recent episodes in past month, states recently saw his cardiologist, had Holter, and there was some discussion of possibly needing pacemaker.  Denies any recent blood loss, no rectal bleeding or melena. Pt denies recent change in medication. No fever or chills.    (Consider location/radiation/quality/duration/timing/severity/associated sxs/prior Treatment) Patient is a 80 y.o. male presenting with dizziness and chest pain. The history is provided by the patient.  Dizziness Associated symptoms: no blood in stool, no headaches, no nausea, no palpitations, no shortness of breath and no vomiting   Chest Pain Associated symptoms: dizziness   Associated symptoms: no abdominal pain, no back pain, no headache, no nausea, no palpitations, no shortness of breath and not vomiting     Past Medical History  Diagnosis Date  . Dizziness   . Fatigue     chronic  . HTN (hypertension)   . Vertigo   . AAA (abdominal aortic aneurysm)     Surgery Dr [name] 2000. /  Ultrasound October, 2012, no significant abnormality, technically difficult  . Dyslipidemia     Triglycerides elevated  . CAD (coronary artery disease)     05/2011 Nuclear normal  /  chest pain December, 2012, CABG  . Elevated glucose   . ALPFXTKW(409.7)     October, 2012  . Ejection fraction     EF normal, nuclear, October, 2012  . Prostate cancer     Dr.Wrenn; S/P radiation  . Pneumonia 1940's  . GERD (gastroesophageal reflux disease)   . Arthritis   . CVA (cerebral vascular  accident)     Old left frontal infarct by MRI 2008  . Hx of CABG     August 21, 2011, Dr. [name], LIMA to distal LAD, SVG acute marginal of RCA, SVG to diagonal  . Thrombocytopenia     Bone marrow biopsy August 20, 2011  . Carotid artery disease     Doppler, hospital, December, 2012, no significant  carotid stenoses  . Itching     May, 2013  . Hyperbilirubinemia     January, 2014.Marland KitchenMarland KitchenDr [name]  . COPD with asthma 02/21/2014  . OSA (obstructive sleep apnea) 12/07/2013   Past Surgical History  Procedure Laterality Date  . Abdominal aortic aneurysm repair  ~ 2000  . Femoral artery aneurysm repair  ~ 2000  . Cholecystectomy    . Knee surgery  2009    left; repair  . Inguinal hernia repair      X3?  [name] Coronary artery bypass graft  08/21/2011    Procedure: CORONARY ARTERY BYPASS GRAFTING (CABG);  Surgeon: [name], MD;  Location: [place];  Service: Open Heart Surgery;  Laterality: N/A;  Coronary Artery Bypass graft on pump times three utlizing the left internal mammary artery and right greater saphenous vein harvested endoscopically   Family History  Problem Relation Age of Onset  . Heart attack Mother   . Heart attack Father   . Heart attack Brother   . Prostate cancer Brother   .  Colon cancer Brother    History  Substance Use Topics  . Smoking status: Former Smoker -- 3.00 packs/day for 50 years    Types: Cigarettes    Quit date: 08/25/1998  . Smokeless tobacco: Former Systems developer    Types: Chew     Comment: quit smoking cigarettes "2000"  . Alcohol Use: No     Comment: 08/15/11 "last alcohol was too long ago to count"    Review of Systems  HENT: Negative for sore throat.   Eyes: Negative for redness.  Respiratory: Negative for shortness of breath.   Cardiovascular: Negative for palpitations and leg swelling.  Gastrointestinal: Negative for nausea, vomiting, abdominal pain and blood in stool.  Genitourinary: Negative for flank pain.  Musculoskeletal: Negative for back  pain and neck pain.  Skin: Negative for rash.  Neurological: Positive for dizziness and light-headedness. Negative for headaches.  Hematological: Does not bruise/bleed easily.  Psychiatric/Behavioral: Negative for confusion.      Allergies  Erythromycin; Penicillins; and Ketorolac tromethamine  Home Medications   Prior to Admission medications   Medication Sig Start Date End Date Taking? Authorizing Provider  aspirin 81 MG tablet Take 81 mg by mouth daily.   Yes Historical Provider, MD  budesonide-formoterol (SYMBICORT) 160-4.5 MCG/ACT inhaler Inhale 2 puffs into the lungs 2 (two) times daily. 03/10/13  Yes [name] [name], [name]  losartan (COZAAR) 50 MG tablet Take 25 mg by mouth daily. 01/30/14  Yes Carlena Bjornstad, MD  metFORMIN (GLUCOPHAGE) 500 MG tablet Take 500 mg by mouth 2 (two) times daily with a meal.   Yes Historical Provider, MD  naproxen sodium (ANAPROX) 220 MG tablet Take 220 mg by mouth 2 (two) times daily with a meal.   Yes Historical Provider, MD  rosuvastatin (CRESTOR) 10 MG tablet Take 10 mg by mouth every other day. 03/10/13 08/17/14 Yes [name] [name], [name]  BAYER CONTOUR TEST test strip USE ONE STRIP TWICE DAILY TO TEST BLOOD  SUGAR LEVELS    [name] [name], [name]   BP 149/107  Pulse 46  Resp 16  Ht _0  (1.803 m)  Wt 214 lb (97.07 kg)  BMI 29.86 kg/m2  SpO2 99% Physical Exam  Nursing note and vitals reviewed. Constitutional: He is oriented to person, place, and time. He appears well-developed and well-nourished.  Markedly bradycardic.   HENT:  Mouth/Throat: Oropharynx is clear and moist.  Eyes: Conjunctivae are normal. Pupils are equal, round, and reactive to light. No scleral icterus.  Neck: Neck supple. No tracheal deviation present.  No bruit  Cardiovascular: Normal heart sounds and intact distal pulses.  Exam reveals no gallop and no friction rub.   No murmur heard. Pulmonary/Chest: Effort normal and breath sounds normal. No accessory  muscle usage. No respiratory distress.  Abdominal: Soft. Bowel sounds are normal. He exhibits no distension and no mass. There is no tenderness. There is no rebound and no guarding.  Genitourinary:  No cva tenderness  Musculoskeletal: Normal range of motion. He exhibits no edema and no tenderness.  Neurological: He is alert and oriented to person, place, and time. No cranial nerve deficit.  Motor intact bil, stre 5/5, sens intact.   Skin: Skin is warm and dry.  Psychiatric: He has a normal mood and affect.    ED Course  Procedures (including critical care time) Labs Review   Results for orders placed during the hospital encounter of 03/28/14  CBC      Result Value Ref Range   WBC 5.6  4.0 -  10.5 K/uL   RBC 4.43  4.22 - 5.81 MIL/uL   Hemoglobin 14.3  13.0 - 17.0 g/dL   HCT 42.5  39.0 - 52.0 %   MCV 95.9  78.0 - 100.0 fL   MCH 32.3  26.0 - 34.0 pg   MCHC 33.6  30.0 - 36.0 g/dL   RDW 14.6  11.5 - 15.5 %   Platelets 79 (*) 150 - 400 K/uL  COMPREHENSIVE METABOLIC PANEL      Result Value Ref Range   Sodium 142  137 - 147 mEq/L   Potassium 4.6  3.7 - 5.3 mEq/L   Chloride 105  96 - 112 mEq/L   CO2 27  19 - 32 mEq/L   Glucose, Bld 176 (*) 70 - 99 mg/dL   BUN 16  6 - 23 mg/dL   Creatinine, Ser 0.87  0.50 - 1.35 mg/dL   Calcium 9.1  8.4 - 10.5 mg/dL   Total Protein 6.4  6.0 - 8.3 g/dL   Albumin 3.9  3.5 - 5.2 g/dL   AST 19  0 - 37 U/L   ALT 19  0 - 53 U/L   Alkaline Phosphatase 67  39 - 117 U/L   Total Bilirubin 1.1  0.3 - 1.2 mg/dL   GFR calc non Af Amer 80 (*) >90 mL/min   GFR calc Af Amer >90  >90 mL/min   Anion gap 10  5 - 15  TROPONIN I      Result Value Ref Range   Troponin I <0.30  <0.30 ng/mL   Dg Chest 2 View  03/28/2014   CLINICAL DATA:  Chest pain  EXAM: CHEST  2 VIEW  COMPARISON:  12/07/2013  FINDINGS: Cardiac shadow is mildly enlarged. Postsurgical changes are again seen. The lungs are well aerated bilaterally without focal infiltrate or sizable effusion. No acute  bony abnormality is seen.  IMPRESSION: No active cardiopulmonary disease.   Electronically Signed   By: Inez Catalina M.D.   On: 03/28/2014 09:51      EKG Interpretation   Date/Time:  Tuesday March 28 2014 08:45:42 EDT Ventricular Rate:  48 PR Interval:  246 QRS Duration: 121 QT Interval:  467 QTC Calculation: 417 R Axis:   -49 Text Interpretation:  Sinus arrhythmia Sinus bradycardia Prolonged PR  interval Nonspecific IVCD with LAD Left ventricular hypertrophy  Nonspecific T abnormalities, diffuse leads Confirmed by Ashok Cordia  MD, Brenin Heidelberger  ([id number]) on 03/28/2014 9:06:02 AM      MDM   Iv ns. Continuous pulse ox and monitor. o2 [place]. Labs.   Reviewed nursing notes and prior charts for additional history.   External pacer pads applied, although not activated as bp normal and no faintness/presyncope when supine.   Cardiology consulted.   Prior charts reviewed, pt w recent Holter for same, although results not available in EPIC.  Recheck pt, no cp or sob. Cardiology to see/admt.     Mirna Mires, MD 03/28/14 [phone]

## 2014-03-28 NOTE — ED Notes (Signed)
To ED from home via [facility] with episode on vertigo-- started about 6:30 this morning, with diaphoresis and chest pain that was relieved with vomiting. Denies Chest pain at present. On arrival-- alert/oriented, w/d. C/o "room is spinning" more comfortable with washcloth over eyes and lights off.

## 2014-03-28 NOTE — H&P (Signed)
Patient ID: [name] MRN: [id number], DOB/AGE: 04/13/1933   Admit date: 03/28/2014   Primary Physician: Redge Gainer, MD Primary Cardiologist: Dr Ron Parker  HPI: 80 y/o obese male with coronary disease. He underwent bypass surgery in September, 2012. LV function was good at that time. This past winter he had increased shortness of breath. He was evaluated fully by the pulmonary team, (Dr Halford Chessman). He was started on Symbicort with some improvement. His sleep study was moderately abnormal and C-pap has been recommended. During this evaluation he had an echo 02/15/14 showing an EF of 50% with grade 1 diastolic dysfunction. Dr Halford Chessman feels the pt has multifactorial dyspnea secondary to obesity, diastolic dysfunction, sleep apnea, and asthma. During his echo a family member noted slow HR in the 44s.  A 48 hr Holter was done which did not show any significant, sustained bradycardia. The pt came to the ER today after an episode of dizziness and diaphoresis after getting up to the BR this am. He made it back to bed and EMS was called. His HR was in the high 40s when EMS arrived. He has had a prior episode of vertigo and the pt attributes his symptoms to this. He says his symptoms are worse when he turns his head and associated with nausea. He is improved in the ER after 12.5 mg Antivert. On further questioning the pt did admit to some chest "tightness" this am. He did not take NTG.  = Problem List: Past Medical History  Diagnosis Date  . Dizziness   . Fatigue     chronic  . HTN (hypertension)   . Vertigo   . AAA (abdominal aortic aneurysm)     Surgery Dr [name] 2000. /  Ultrasound October, 2012, no significant abnormality, technically difficult  . Dyslipidemia     Triglycerides elevated  . CAD (coronary artery disease)     05/2011 Nuclear normal  /  chest pain December, 2012, CABG  . Elevated glucose   . IBBCWUGQ(916.9)     October, 2012  . Ejection fraction     EF normal, nuclear, October, 2012    . Prostate cancer     Dr.Wrenn; S/P radiation  . Pneumonia 1940's  . GERD (gastroesophageal reflux disease)   . Arthritis   . CVA (cerebral vascular accident)     Old left frontal infarct by MRI 2008  . Hx of CABG     August 21, 2011, Dr. [name], LIMA to distal LAD, SVG acute marginal of RCA, SVG to diagonal  . Thrombocytopenia     Bone marrow biopsy August 20, 2011  . Carotid artery disease     Doppler, hospital, December, 2012, no significant  carotid stenoses  . Itching     May, 2013  . Hyperbilirubinemia     January, 2014.Marland KitchenMarland KitchenDr [name]  . COPD with asthma 02/21/2014  . OSA (obstructive sleep apnea) 12/07/2013    Past Surgical History  Procedure Laterality Date  . Abdominal aortic aneurysm repair  ~ 2000  . Femoral artery aneurysm repair  ~ 2000  . Cholecystectomy    . Knee surgery  2009    left; repair  . Inguinal hernia repair      X3?  [name] Coronary artery bypass graft  08/21/2011    Procedure: CORONARY ARTERY BYPASS GRAFTING (CABG);  Surgeon: [name], MD;  Location: [place];  Service: Open Heart Surgery;  Laterality: N/A;  Coronary Artery Bypass graft on pump times three utlizing the left  internal mammary artery and right greater saphenous vein harvested endoscopically     Allergies:  Allergies  Allergen Reactions  . Erythromycin Other (See Comments)    Unknown  . Penicillins Other (See Comments)    Unknown  . Ketorolac Tromethamine Rash     Home Medications Current Facility-Administered Medications  Medication Dose Route Frequency Provider Last Rate Last Dose  . 0.9 %  sodium chloride infusion   Intravenous Continuous Mirna Mires, MD 20 mL/hr at 03/28/14 0388 20 mL/hr at 03/28/14 0917   Current Outpatient Prescriptions  Medication Sig Dispense Refill  . aspirin 81 MG tablet Take 81 mg by mouth daily.      . budesonide-formoterol (SYMBICORT) 160-4.5 MCG/ACT inhaler Inhale 2 puffs into the lungs 2 (two) times daily.  1 Inhaler  5  . losartan (COZAAR)  50 MG tablet Take 25 mg by mouth daily.      . metFORMIN (GLUCOPHAGE) 500 MG tablet Take 500 mg by mouth 2 (two) times daily with a meal.      . naproxen sodium (ANAPROX) 220 MG tablet Take 220 mg by mouth 2 (two) times daily with a meal.      . rosuvastatin (CRESTOR) 10 MG tablet Take 10 mg by mouth every other day.      [name] BAYER CONTOUR TEST test strip USE ONE STRIP TWICE DAILY TO TEST BLOOD  SUGAR LEVELS  100 each  0     Family History  Problem Relation Age of Onset  . Heart attack Mother   . Heart attack Father   . Heart attack Brother   . Prostate cancer Brother   . Colon cancer Brother      History   Social History  . Marital Status: Married    Spouse Name: N/A    Number of Children: 4  . Years of Education: N/A   Occupational History  . retired    Social History Main Topics  . Smoking status: Former Smoker -- 3.00 packs/day for 50 years    Types: Cigarettes    Quit date: 08/25/1998  . Smokeless tobacco: Former Systems developer    Types: Chew     Comment: quit smoking cigarettes "2000"  . Alcohol Use: No     Comment: 08/15/11 "last alcohol was too long ago to count"  . Drug Use: No  . Sexual Activity: No   Other Topics Concern  . Not on file   Social History Narrative  . No narrative on file     Review of Systems: General: negative for chills, fever, night sweats or weight changes.  Cardiovascular: negative for, edema, orthopnea, palpitations Dermatological: negative for rash Respiratory: negative for cough or wheezing Urologic: negative for hematuria Abdominal: negative for nausea, vomiting, diarrhea, bright red blood per rectum, melena, or hematemesis Neurologic: negative for visual changes, syncope, or dizziness All other systems reviewed and are otherwise negative except as noted above.  Physical Exam: Blood pressure 140/55, pulse 46, temperature 97.9 F (36.6 C), temperature source Oral, resp. rate 13, height _0  (1.803 m), weight 214 lb (97.07 kg), SpO2  99.00%.  General appearance: alert, cooperative, no distress and morbidly obese Neck: no carotid bruit and no JVD Lungs: clear to auscultation bilaterally Heart: regular rate and rhythm Abdomen: obese, midline surgical scar, umbilical hernia Extremities: no edema Pulses: 2+ and symmetric Skin: Skin color, texture, turgor normal. No rashes or lesions Neurologic: Grossly normal  Labs:   Results for orders placed during the hospital encounter of 03/28/14 (from  the past 24 hour(s))  CBC     Status: Abnormal   Collection Time    03/28/14  9:00 AM      Result Value Ref Range   WBC 5.6  4.0 - 10.5 K/uL   RBC 4.43  4.22 - 5.81 MIL/uL   Hemoglobin 14.3  13.0 - 17.0 g/dL   HCT 42.5  39.0 - 52.0 %   MCV 95.9  78.0 - 100.0 fL   MCH 32.3  26.0 - 34.0 pg   MCHC 33.6  30.0 - 36.0 g/dL   RDW 14.6  11.5 - 15.5 %   Platelets 79 (*) 150 - 400 K/uL  COMPREHENSIVE METABOLIC PANEL     Status: Abnormal   Collection Time    03/28/14  9:00 AM      Result Value Ref Range   Sodium 142  137 - 147 mEq/L   Potassium 4.6  3.7 - 5.3 mEq/L   Chloride 105  96 - 112 mEq/L   CO2 27  19 - 32 mEq/L   Glucose, Bld 176 (*) 70 - 99 mg/dL   BUN 16  6 - 23 mg/dL   Creatinine, Ser 0.87  0.50 - 1.35 mg/dL   Calcium 9.1  8.4 - 10.5 mg/dL   Total Protein 6.4  6.0 - 8.3 g/dL   Albumin 3.9  3.5 - 5.2 g/dL   AST 19  0 - 37 U/L   ALT 19  0 - 53 U/L   Alkaline Phosphatase 67  39 - 117 U/L   Total Bilirubin 1.1  0.3 - 1.2 mg/dL   GFR calc non Af Amer 80 (*) >90 mL/min   GFR calc Af Amer >90  >90 mL/min   Anion gap 10  5 - 15  TROPONIN I     Status: None   Collection Time    03/28/14  9:00 AM      Result Value Ref Range   Troponin I <0.30  <0.30 ng/mL  URINALYSIS, ROUTINE W REFLEX MICROSCOPIC     Status: None   Collection Time    03/28/14 12:08 PM      Result Value Ref Range   Color, Urine YELLOW  YELLOW   APPearance CLEAR  CLEAR   Specific Gravity, Urine 1.015  1.005 - 1.030   pH 7.0  5.0 - 8.0   Glucose, UA  NEGATIVE  NEGATIVE mg/dL   Hgb urine dipstick NEGATIVE  NEGATIVE   Bilirubin Urine NEGATIVE  NEGATIVE   Ketones, ur NEGATIVE  NEGATIVE mg/dL   Protein, ur NEGATIVE  NEGATIVE mg/dL   Urobilinogen, UA 1.0  0.0 - 1.0 mg/dL   Nitrite NEGATIVE  NEGATIVE   Leukocytes, UA NEGATIVE  NEGATIVE     Radiology/Studies: Dg Chest 2 View  03/28/2014   CLINICAL DATA:  Chest pain  EXAM: CHEST  2 VIEW  COMPARISON:  12/07/2013  FINDINGS: Cardiac shadow is mildly enlarged. Postsurgical changes are again seen. The lungs are well aerated bilaterally without focal infiltrate or sizable effusion. No acute bony abnormality is seen.  IMPRESSION: No active cardiopulmonary disease.   Electronically Signed   By: Inez Catalina M.D.   On: 03/28/2014 09:51    EKG:NSR, SB-48. LVH by volts, PACs noted  ASSESSMENT AND PLAN:  Principal Problem:   Dizziness- attributed to vertigo Principal Problem:   Dizziness Active Problems:   Chest pain with moderate risk of acute coronary syndrome   Dyspnea on exertion   Hypertensive cardiovascular disease   Obesity  Diabetes mellitus   CVA (cerebral vascular accident)   Prostate cancer   Hyperlipidemia with target LDL less than 100   Ejection fraction   S/P AAA repair   S/P CABG x 3   Thrombocytopenia   Sinus bradycardia- ? symptomatic   Dyspnea- multifactorial   OSA (obstructive sleep apnea)- recent eval, need C-pap   COPD with asthma   Diastolic dysfunction- grade 1 by echo June 2015, EF 50%   Vestibular dizziness   PLAN: The family seems concerned he may need a pacemaker though testing thus far has not strongly indicated this. It may be best to admit for observation on telemetry. I would also recommend a Myoview as he has had some chest tightness. As far as his complaints of dyspnea, I will order a D-dimer, (he does have a history of prostate cancer), and a BNP.    Henri Medal, PA-C 03/28/2014, 2:10 PM  I have seen and evaluated the patient this PM along  with Kerin Ransom, PA. I agree with his findings, examination as well as impression recommendations.  Very pleasant elderly gentleman with known coronary disease status post CABG who has been undergoing a detailed evaluation for exertional dyspnea over last few months. He now presents with what seems like an episode of profound vertigo but was associated with dyspnea and diaphoresis.  He also noticed some chest tightness is concerning for possible cardiac etiology in a patient with CABG. Especially in light of his exertional dyspnea he does his be evaluated for possible ACS plus minus PE. I do agree with checking troponins and d-dimer. We'll check pro BNP to assess for possible CHF complement given his diastolic dysfunction.  Plan will be to monitor on telemetry overnight and evaluate for rate responsiveness to exercise in an effort to rule out symptomatic bradycardia and chronotropic incompetence. We'll also then check a scan Myoview in the morning to evaluate for discrete ischemic complement of his exertional dyspnea.  In the absence of any true ischemia on stress test, we could then potentially agree with the multifactorial etiology of dyspnea.  Full code.  MD Time with pt: 20 min + PA time - total 50 min   Felis Quillin W, M.D., M.S. Interventional Cardiologist   Pager # [phone] 03/28/2014

## 2014-03-28 NOTE — ED Notes (Signed)
Pt states he is too dizzy to have orthostatic VS completed-- crying, has washcloth covering eyes and lights off in room, family at bedside

## 2014-03-28 NOTE — Progress Notes (Signed)
Informed by nursing staff, patient had an episode of CP. Symptom resolved after nitroglycerine. Onset when patient was laying on bed. Also endose some mild SOB. EKG obtained which did not reveal significant ST or T wave changes. Previous T wave inversion in the inferior lead has actually resolved. Per daughter, who is a cardiac nurse, symptom unrelated to deep inspiration or body position. Will continue to observe for now. If troponin elevated, will start IV heparin. Pending stess test in am.  Of note, patient also elevated d-dimer, states his calf hurt with ambulation which sound like PAD. Telemetry shows HR 60s. Briefly discuss with Dr Meda Coffee regarding d-dimer, if HR increase, will obtain CTA of chest vs LE venous doppler.  Hilbert Corrigan PA Pager: [phone]

## 2014-03-29 ENCOUNTER — Observation Stay (HOSPITAL_COMMUNITY): Payer: Medicare HMO

## 2014-03-29 ENCOUNTER — Encounter (HOSPITAL_COMMUNITY): Payer: Self-pay | Admitting: General Practice

## 2014-03-29 LAB — GLUCOSE, CAPILLARY
GLUCOSE-CAPILLARY: 157 mg/dL — AB (ref 70–99)
GLUCOSE-CAPILLARY: 111 mg/dL — AB (ref 70–99)
GLUCOSE-CAPILLARY: 135 mg/dL — AB (ref 70–99)
Glucose-Capillary: 147 mg/dL — ABNORMAL HIGH (ref 70–99)
Glucose-Capillary: 129 mg/dL — ABNORMAL HIGH (ref 70–99)

## 2014-03-29 LAB — TROPONIN I: Troponin I: <0.3 ng/mL (ref <0.30)

## 2014-03-29 MED ORDER — SODIUM CHLORIDE 0.9 % IV SOLN: 250.0000 mL | INTRAVENOUS | Status: DC | PRN

## 2014-03-29 MED ORDER — REGADENOSON 0.4 MG/5ML IV SOLN
0.4000 mg | Freq: Once | INTRAVENOUS | Status: AC
Start: 2014-03-29 — End: 2014-03-29
  Administered 2014-03-29: 0.4 mg via INTRAVENOUS
  Filled 2014-03-29: qty 5

## 2014-03-29 MED ORDER — SODIUM CHLORIDE 0.9 % IV SOLN
1.0000 mL/kg/h | INTRAVENOUS | Status: DC
  Administered 2014-03-30: 1 mL/kg/h via INTRAVENOUS

## 2014-03-29 MED ORDER — ASPIRIN EC 81 MG PO TBEC: 81.0000 mg | DELAYED_RELEASE_TABLET | Freq: Every day | ORAL | Status: DC

## 2014-03-29 MED ORDER — TECHNETIUM TC 99M SESTAMIBI GENERIC - CARDIOLITE
30.0000 | Freq: Once | INTRAVENOUS | Status: AC | PRN
  Administered 2014-03-29: 30 via INTRAVENOUS

## 2014-03-29 MED ORDER — REGADENOSON 0.4 MG/5ML IV SOLN
INTRAVENOUS | Status: AC
  Administered 2014-03-29: 0.4 mg via INTRAVENOUS
  Filled 2014-03-29: qty 5

## 2014-03-29 MED ORDER — SODIUM CHLORIDE 0.9 % IJ SOLN
3.0000 mL | Freq: Two times a day (BID) | INTRAMUSCULAR | Status: DC
  Administered 2014-03-29 (×2): 3 mL via INTRAVENOUS

## 2014-03-29 MED ORDER — SODIUM CHLORIDE 0.9 % IJ SOLN: 3.0000 mL | INTRAMUSCULAR | Status: DC | PRN

## 2014-03-29 MED ORDER — TECHNETIUM TC 99M SESTAMIBI GENERIC - CARDIOLITE
10.0000 | Freq: Once | INTRAVENOUS | Status: AC | PRN
  Administered 2014-03-29: 10 via INTRAVENOUS

## 2014-03-29 MED ORDER — ASPIRIN 81 MG PO CHEW
81.0000 mg | CHEWABLE_TABLET | ORAL | Status: AC
  Administered 2014-03-30: 81 mg via ORAL
  Filled 2014-03-29: qty 1

## 2014-03-29 NOTE — Progress Notes (Signed)
Subjective: No CP and breathing better.  Objective: Vital signs in last 24 hours: Temp:  [97.5 F (36.4 C)-98 F (36.7 C)] 97.5 F (36.4 C) (08/05 0540) Pulse Rate:  [46-98] 57 (08/05 0540) Resp:  [10-18] 18 (08/05 0540) BP: (121-164)/(48-107) 124/49 mmHg (08/05 0540) SpO2:  [92 %-99 %] 96 % (08/05 0540) Weight:  [214 lb (97.07 kg)-214 lb 3.2 oz (97.16 kg)] 214 lb 3.2 oz (97.16 kg) (08/05 0540) Last BM Date: 03/27/14  Intake/Output from previous day: 08/04 0701 - 08/05 0700 In: 120 [P.O.:120] Out: 650 [Urine:650] Intake/Output this shift:    Medications Current Facility-Administered Medications  Medication Dose Route Frequency Provider Last Rate Last Dose  . 0.9 %  sodium chloride infusion   Intravenous Continuous Mirna Mires, MD 20 mL/hr at 03/28/14 0917 20 mL/hr at 03/28/14 0917  . 0.9 %  sodium chloride infusion  250 mL Intravenous PRN Erlene Quan, PA-C      . acetaminophen (TYLENOL) tablet 650 mg  650 mg Oral Q4H PRN Erlene Quan, PA-C      . ALPRAZolam Duanne Moron) tablet 0.25 mg  0.25 mg Oral BID PRN Erlene Quan, PA-C      . aspirin EC tablet 81 mg  81 mg Oral Daily Luke K Kilroy, PA-C      . atorvastatin (LIPITOR) tablet 20 mg  20 mg Oral q1800 Doreene Burke Kilroy, PA-C   20 mg at 03/28/14 1703  . budesonide-formoterol (SYMBICORT) 160-4.5 MCG/ACT inhaler 2 puff  2 puff Inhalation BID Doreene Burke Kilroy, PA-C      . enoxaparin (LOVENOX) injection 40 mg  40 mg Subcutaneous Q24H Doreene Burke Kilroy, PA-C   40 mg at 03/28/14 1704  . insulin aspart (novoLOG) injection 0-15 Units  0-15 Units Subcutaneous TID WC Erlene Quan, PA-C   2 Units at 03/28/14 1704  . insulin aspart (novoLOG) injection 0-5 Units  0-5 Units Subcutaneous QHS Luke K Kilroy, PA-C      . losartan (COZAAR) tablet 25 mg  25 mg Oral Daily Erlene Quan, PA-C   25 mg at 03/28/14 1703  . meclizine (ANTIVERT) tablet 12.5 mg  12.5 mg Oral TID PRN Erlene Quan, PA-C      . nitroGLYCERIN (NITROSTAT) SL tablet 0.4 mg  0.4  mg Sublingual Q5 Min x 3 PRN Erlene Quan, PA-C   0.4 mg at 03/28/14 0867  . ondansetron (ZOFRAN) injection 4 mg  4 mg Intravenous Q6H PRN Doreene Burke Kilroy, PA-C      . sodium chloride 0.9 % injection 3 mL  3 mL Intravenous Q12H Luke K Kilroy, PA-C      . sodium chloride 0.9 % injection 3 mL  3 mL Intravenous PRN Luke K Kilroy, PA-C      . zolpidem (AMBIEN) tablet 5 mg  5 mg Oral QHS PRN Erlene Quan, PA-C        PE: General appearance: alert, cooperative and no distress Lungs: clear to auscultation bilaterally Heart: regular rate and rhythm, S1, S2 normal, no murmur, click, rub or gallop Abdomen: +BS Extremities: No LEE Pulses: 2+ and symmetric Skin: Warm and dry Neurologic: Grossly normal  Lab Results:   Recent Labs  03/28/14 0900  WBC 5.6  HGB 14.3  HCT 42.5  PLT 79*   BMET  Recent Labs  03/28/14 0900  NA 142  K 4.6  CL 105  CO2 27  GLUCOSE 176*  BUN 16  CREATININE 0.87  CALCIUM  9.1   PT/INR No results found for this basename: LABPROT, INR,  in the last 72 hours Cholesterol No results found for this basename: CHOL,  in the last 72 hours Cardiac Enzymes No components found with this basename: TROPONIN,  CKMB,   Studies/Results: @RISRSLT2 @   Assessment/Plan    Principal Problem:   Dizziness Active Problems:   Chest pain with moderate risk of acute coronary syndrome   Dyspnea- multifactorial   OSA (obstructive sleep apnea)- recent eval, need C-pap   Obesity   Dyspnea on exertion   Diabetes mellitus   Hypertensive cardiovascular disease   CVA (cerebral vascular accident)   Prostate cancer   Hyperlipidemia with target LDL less than 100   Ejection fraction   S/P AAA repair   S/P CABG x 3   Thrombocytopenia   Sinus bradycardia- ? symptomatic   COPD with asthma   Diastolic dysfunction- grade 1 by echo June 2015, EF 50%   Vestibular dizziness  Plan: The patient had two negative troponins thus far.  Checking a third.  BNP is WNL.  DDimer is  elevated at 0.97.  He is scheduled for a lexiscan today.  Will then have to wait until Friday to due a VQ, however, he is not tachycardic and CP has resolved, so the likely hood of PE is low.  We may be able to DC after Nuc if normal.  Some bradycardia in the low 40's.  BP and HR stable.   He is in the process of getting his CPAP for home use.   ON ASA, lipitor and cozaar.    LOS: 1 day    HAGER, BRYAN PA-C 03/29/2014 7:58 AM  Patient seen. He has had no further symptoms of chest pain or dyspnea. If myoview is normal he can be discharged later today and followup with Dr. Ron Parker in [place] in the next week or two. He does not need a VQ scan.

## 2014-03-29 NOTE — Progress Notes (Addendum)
    Underwent nuclear stress test. Tolerated procedure well.  Peretz Thieme Stern PA-C  [facility]    

## 2014-03-29 NOTE — Progress Notes (Signed)
UR completed 

## 2014-03-30 ENCOUNTER — Encounter (HOSPITAL_COMMUNITY)
Admission: EM | Disposition: A | Discharge status: Home / Self Care | Payer: Commercial Managed Care - HMO | Source: Home / Self Care | Attending: Emergency Medicine

## 2014-03-30 HISTORY — PX: CARDIAC CATHETERIZATION: SHX172

## 2014-03-30 LAB — GLUCOSE, CAPILLARY
GLUCOSE-CAPILLARY: 119 mg/dL — AB (ref 70–99)
Glucose-Capillary: 116 mg/dL — ABNORMAL HIGH (ref 70–99)
Glucose-Capillary: 199 mg/dL — ABNORMAL HIGH (ref 70–99)

## 2014-03-30 LAB — PROTIME-INR
INR: 1.08 (ref 0.00–1.49)
Prothrombin Time: 14 seconds (ref 11.6–15.2)

## 2014-03-30 SURGERY — LEFT HEART CATH AND CORS/GRAFTS ANGIOGRAPHY

## 2014-03-30 MED ORDER — LIDOCAINE HCL (PF) 1 % IJ SOLN
INTRAMUSCULAR | Status: AC
  Filled 2014-03-30: qty 30

## 2014-03-30 MED ORDER — MECLIZINE HCL 12.5 MG PO TABS: 12.5000 mg | ORAL_TABLET | Freq: Three times a day (TID) | ORAL | Status: DC | PRN

## 2014-03-30 MED ORDER — VERAPAMIL HCL 2.5 MG/ML IV SOLN
INTRAVENOUS | Status: AC
  Filled 2014-03-30: qty 2

## 2014-03-30 MED ORDER — ACETAMINOPHEN 325 MG PO TABS: 650.0000 mg | ORAL_TABLET | ORAL | Status: DC | PRN

## 2014-03-30 MED ORDER — HEPARIN SODIUM (PORCINE) 1000 UNIT/ML IJ SOLN
INTRAMUSCULAR | Status: AC
  Filled 2014-03-30: qty 1

## 2014-03-30 MED ORDER — HEPARIN (PORCINE) IN NACL 2-0.9 UNIT/ML-% IJ SOLN
INTRAMUSCULAR | Status: AC
Start: 2014-03-30 — End: 2014-03-30
  Filled 2014-03-30: qty 1000

## 2014-03-30 MED ORDER — FENTANYL CITRATE 0.05 MG/ML IJ SOLN
INTRAMUSCULAR | Status: AC
  Filled 2014-03-30: qty 2

## 2014-03-30 MED ORDER — NITROGLYCERIN 1 MG/10 ML FOR IR/CATH LAB
INTRA_ARTERIAL | Status: AC
  Filled 2014-03-30: qty 10

## 2014-03-30 MED ORDER — HEPARIN (PORCINE) IN NACL 2-0.9 UNIT/ML-% IJ SOLN
INTRAMUSCULAR | Status: AC
  Filled 2014-03-30: qty 1000

## 2014-03-30 MED ORDER — MIDAZOLAM HCL 2 MG/2ML IJ SOLN
INTRAMUSCULAR | Status: AC
  Filled 2014-03-30: qty 2

## 2014-03-30 MED ORDER — ONDANSETRON HCL 4 MG/2ML IJ SOLN: 4.0000 mg | Freq: Four times a day (QID) | INTRAMUSCULAR | Status: DC | PRN

## 2014-03-30 MED ORDER — METFORMIN HCL 500 MG PO TABS: 500.0000 mg | ORAL_TABLET | Freq: Two times a day (BID) | ORAL | Status: DC

## 2014-03-30 MED ORDER — SODIUM CHLORIDE 0.9 % IV SOLN
1.0000 mL/kg/h | INTRAVENOUS | Status: AC
  Administered 2014-03-30: 1 mL/kg/h via INTRAVENOUS

## 2014-03-30 MED ORDER — NITROGLYCERIN 1 MG/10 ML FOR IR/CATH LAB
INTRA_ARTERIAL | Status: AC
  Filled 2014-03-30: qty 20

## 2014-03-30 NOTE — Discharge Instructions (Signed)
No driving for 48 hours. No lifting over 5 lbs for 1 week. No sexual activity for 1 week. Keep procedure site clean & dry. If you notice increased pain, swelling, bleeding or pus, call/return!  You may shower, but no soaking baths/hot tubs/pools for 1 week.   Radial Site Care Refer to this sheet in the next few weeks. These instructions provide you with information on caring for yourself after your procedure. Your caregiver may also give you more specific instructions. Your treatment has been planned according to current medical practices, but problems sometimes occur. Call your caregiver if you have any problems or questions after your procedure. HOME CARE INSTRUCTIONS  You may shower the day after the procedure.Remove the bandage (dressing) and gently wash the site with plain soap and water.Gently pat the site dry.  Do not apply powder or lotion to the site.  Do not submerge the affected site in water for 3 to 5 days.  Inspect the site at least twice daily.  Do not flex or bend the affected arm for 24 hours.  No lifting over 5 pounds (2.3 kg) for 5 days after your procedure.  Do not drive home if you are discharged the same day of the procedure. Have someone else drive you.  You may drive 24 hours after the procedure unless otherwise instructed by your caregiver.  Do not operate machinery or power tools for 24 hours.  A responsible adult should be with you for the first 24 hours after you arrive home. What to expect:  Any bruising will usually fade within 1 to 2 weeks.  Blood that collects in the tissue (hematoma) may be painful to the touch. It should usually decrease in size and tenderness within 1 to 2 weeks. SEEK IMMEDIATE MEDICAL CARE IF:  You have unusual pain at the radial site.  You have redness, warmth, swelling, or pain at the radial site.  You have drainage (other than a small amount of blood on the dressing).  You have chills.  You have a fever or persistent  symptoms for more than 72 hours.  You have a fever and your symptoms suddenly get worse.  Your arm becomes pale, cool, tingly, or numb.  You have heavy bleeding from the site. Hold pressure on the site. Document Released: 09/13/2010 Document Revised: 11/03/2011 Document Reviewed: 09/13/2010 [facility] Patient Information 2015 [place], [place]. This information is not intended to replace advice given to you by your health care provider. Make sure you discuss any questions you have with your health care provider.  Bradycardia Bradycardia is a term for a heart rate (pulse) that, in adults, is slower than 60 beats per minute. A normal rate is 60 to 100 beats per minute. A heart rate below 60 beats per minute may be normal for some adults with healthy hearts. If the rate is too slow, the heart may have trouble pumping the volume of blood the body needs. If the heart rate gets too low, blood flow to the brain may be decreased and may make you feel lightheaded, dizzy, or faint. The heart has a natural pacemaker in the top of the heart called the SA node (sinoatrial or sinus node). This pacemaker sends out regular electrical signals to the muscle of the heart, telling the heart muscle when to beat (contract). The electrical signal travels from the upper parts of the heart (atria) through the AV node (atrioventricular node), to the lower chambers of the heart (ventricles). The ventricles squeeze, pumping the blood  from your heart to your lungs and to the rest of your body. CAUSES   Problem with the heart's electrical system.  Problem with the heart's natural pacemaker.  Heart disease, damage, or infection.  Medications.  Problems with minerals and salts (electrolytes). SYMPTOMS   Fainting (syncope).  Fatigue and weakness.  Shortness of breath (dyspnea).  Chest pain (angina).  Drowsiness.  Confusion. DIAGNOSIS   An electrocardiogram (ECG) can help your caregiver determine the type of slow  heart rate you have.  If the cause is not seen on an ECG, you may need to wear a heart monitor that records your heart rhythm for several hours or days.  Blood tests. TREATMENT   Electrolyte supplements.  Medications.  Withholding medication which is causing a slow heart rate.  Pacemaker placement. SEEK IMMEDIATE MEDICAL CARE IF:   You feel lightheaded or faint.  You develop an irregular heart rate.  You feel chest pain or have trouble breathing. MAKE SURE YOU:   Understand these instructions.  Will watch your condition.  Will get help right away if you are not doing well or get worse. Document Released: 05/03/2002 Document Revised: 11/03/2011 Document Reviewed: 11/16/2013 [facility] Patient Information 2015 [place], [place]. This information is not intended to replace advice given to you by your health care provider. Make sure you discuss any questions you have with your health care provider.

## 2014-03-30 NOTE — H&P (View-Only) (Signed)
Subjective: No CP and breathing better.  Objective: Vital signs in last 24 hours: Temp:  [97.5 F (36.4 C)-98 F (36.7 C)] 97.5 F (36.4 C) (08/05 0540) Pulse Rate:  [46-98] 57 (08/05 0540) Resp:  [10-18] 18 (08/05 0540) BP: (121-164)/(48-107) 124/49 mmHg (08/05 0540) SpO2:  [92 %-99 %] 96 % (08/05 0540) Weight:  [214 lb (97.07 kg)-214 lb 3.2 oz (97.16 kg)] 214 lb 3.2 oz (97.16 kg) (08/05 0540) Last BM Date: 03/27/14  Intake/Output from previous day: 08/04 0701 - 08/05 0700 In: 120 [P.O.:120] Out: 650 [Urine:650] Intake/Output this shift:    Medications Current Facility-Administered Medications  Medication Dose Route Frequency Provider Last Rate Last Dose  . 0.9 %  sodium chloride infusion   Intravenous Continuous Mirna Mires, MD 20 mL/hr at 03/28/14 0917 20 mL/hr at 03/28/14 0917  . 0.9 %  sodium chloride infusion  250 mL Intravenous PRN Erlene Quan, PA-C      . acetaminophen (TYLENOL) tablet 650 mg  650 mg Oral Q4H PRN Erlene Quan, PA-C      . ALPRAZolam Duanne Moron) tablet 0.25 mg  0.25 mg Oral BID PRN Erlene Quan, PA-C      . aspirin EC tablet 81 mg  81 mg Oral Daily Luke K Kilroy, PA-C      . atorvastatin (LIPITOR) tablet 20 mg  20 mg Oral q1800 Doreene Burke Kilroy, PA-C   20 mg at 03/28/14 1703  . budesonide-formoterol (SYMBICORT) 160-4.5 MCG/ACT inhaler 2 puff  2 puff Inhalation BID Doreene Burke Kilroy, PA-C      . enoxaparin (LOVENOX) injection 40 mg  40 mg Subcutaneous Q24H Doreene Burke Kilroy, PA-C   40 mg at 03/28/14 1704  . insulin aspart (novoLOG) injection 0-15 Units  0-15 Units Subcutaneous TID WC Erlene Quan, PA-C   2 Units at 03/28/14 1704  . insulin aspart (novoLOG) injection 0-5 Units  0-5 Units Subcutaneous QHS Luke K Kilroy, PA-C      . losartan (COZAAR) tablet 25 mg  25 mg Oral Daily Erlene Quan, PA-C   25 mg at 03/28/14 1703  . meclizine (ANTIVERT) tablet 12.5 mg  12.5 mg Oral TID PRN Erlene Quan, PA-C      . nitroGLYCERIN (NITROSTAT) SL tablet 0.4 mg  0.4  mg Sublingual Q5 Min x 3 PRN Erlene Quan, PA-C   0.4 mg at 03/28/14 6314  . ondansetron (ZOFRAN) injection 4 mg  4 mg Intravenous Q6H PRN Doreene Burke Kilroy, PA-C      . sodium chloride 0.9 % injection 3 mL  3 mL Intravenous Q12H Luke K Kilroy, PA-C      . sodium chloride 0.9 % injection 3 mL  3 mL Intravenous PRN Luke K Kilroy, PA-C      . zolpidem (AMBIEN) tablet 5 mg  5 mg Oral QHS PRN Erlene Quan, PA-C        PE: General appearance: alert, cooperative and no distress Lungs: clear to auscultation bilaterally Heart: regular rate and rhythm, S1, S2 normal, no murmur, click, rub or gallop Abdomen: +BS Extremities: No LEE Pulses: 2+ and symmetric Skin: Warm and dry Neurologic: Grossly normal  Lab Results:   Recent Labs  03/28/14 0900  WBC 5.6  HGB 14.3  HCT 42.5  PLT 79*   BMET  Recent Labs  03/28/14 0900  NA 142  K 4.6  CL 105  CO2 27  GLUCOSE 176*  BUN 16  CREATININE 0.87  CALCIUM  9.1   PT/INR No results found for this basename: LABPROT, INR,  in the last 72 hours Cholesterol No results found for this basename: CHOL,  in the last 72 hours Cardiac Enzymes No components found with this basename: TROPONIN,  CKMB,   Studies/Results: @RISRSLT2 @   Assessment/Plan    Principal Problem:   Dizziness Active Problems:   Chest pain with moderate risk of acute coronary syndrome   Dyspnea- multifactorial   OSA (obstructive sleep apnea)- recent eval, need C-pap   Obesity   Dyspnea on exertion   Diabetes mellitus   Hypertensive cardiovascular disease   CVA (cerebral vascular accident)   Prostate cancer   Hyperlipidemia with target LDL less than 100   Ejection fraction   S/P AAA repair   S/P CABG x 3   Thrombocytopenia   Sinus bradycardia- ? symptomatic   COPD with asthma   Diastolic dysfunction- grade 1 by echo June 2015, EF 50%   Vestibular dizziness  Plan: The patient had two negative troponins thus far.  Checking a third.  BNP is WNL.  DDimer is  elevated at 0.97.  He is scheduled for a lexiscan today.  Will then have to wait until Friday to due a VQ, however, he is not tachycardic and CP has resolved, so the likely hood of PE is low.  We may be able to DC after Nuc if normal.  Some bradycardia in the low 40's.  BP and HR stable.   He is in the process of getting his CPAP for home use.   ON ASA, lipitor and cozaar.    LOS: 1 day    HAGER, BRYAN PA-C 03/29/2014 7:58 AM  Patient seen. He has had no further symptoms of chest pain or dyspnea. If myoview is normal he can be discharged later today and followup with Dr. Ron Parker in [place] in the next week or two. He does not need a VQ scan.

## 2014-03-30 NOTE — Progress Notes (Signed)
Patient Name: [name] Date of Encounter: 03/30/2014     Principal Problem:   Dizziness Active Problems:   Diabetes mellitus   Hypertensive cardiovascular disease   CVA (cerebral vascular accident)   Prostate cancer   Hyperlipidemia with target LDL less than 100   Ejection fraction   S/P AAA repair   S/P CABG x 3   Thrombocytopenia   Chest pain with moderate risk of acute coronary syndrome   Sinus bradycardia- ? symptomatic   Dyspnea- multifactorial   OSA (obstructive sleep apnea)- recent eval, need C-pap   COPD with asthma   Diastolic dysfunction- grade 1 by echo June 2015, EF 50%   Obesity   Vestibular dizziness   Dyspnea on exertion    SUBJECTIVE  No CP or SOB overnight. Feels good.  CURRENT MEDS . [START ON 03/31/2014] aspirin EC  81 mg Oral Daily  . atorvastatin  20 mg Oral q1800  . budesonide-formoterol  2 puff Inhalation BID  . insulin aspart  0-15 Units Subcutaneous TID WC  . insulin aspart  0-5 Units Subcutaneous QHS  . losartan  25 mg Oral Daily  . sodium chloride  3 mL Intravenous Q12H    OBJECTIVE  Filed Vitals:   03/29/14 2105 03/30/14 0613 03/30/14 0740 03/30/14 0908  BP:  155/65  136/70  Pulse:  76 55 42  Temp:  97.5 F (36.4 C)    TempSrc:  Oral    Resp:  16    Height:      Weight:  220 lb 9.6 oz (100.064 kg)    SpO2: 97% 96%      Intake/Output Summary (Last 24 hours) at 03/30/14 1144 Last data filed at 03/30/14 0600  Gross per 24 hour  Intake  821.1 ml  Output    500 ml  Net  321.1 ml   Filed Weights   03/29/14 0540 03/29/14 1938 03/30/14 0613  Weight: 214 lb 3.2 oz (97.16 kg) 216 lb 8 oz (98.204 kg) 220 lb 9.6 oz (100.064 kg)    PHYSICAL EXAM  General: Pleasant, NAD. Neuro: Alert and oriented X 3. Moves all extremities spontaneously. Psych: Normal affect. HEENT:  Normal  Neck: Supple without bruits or JVD. Lungs:  Resp regular and unlabored, CTA. Heart: RRR no s3, s4, or murmurs. Abdomen: Soft, non-tender, BS + x 4.  Distended, central hernia noted. Extremities: No clubbing, cyanosis or edema. DP/PT/Radials 2+ and equal bilaterally.  Accessory Clinical Findings  CBC  Recent Labs  03/28/14 0900  WBC 5.6  HGB 14.3  HCT 42.5  MCV 95.9  PLT 79*   Basic Metabolic Panel  Recent Labs  03/28/14 0900  NA 142  K 4.6  CL 105  CO2 27  GLUCOSE 176*  BUN 16  CREATININE 0.87  CALCIUM 9.1   Liver Function Tests  Recent Labs  03/28/14 0900  AST 19  ALT 19  ALKPHOS 67  BILITOT 1.1  PROT 6.4  ALBUMIN 3.9   Cardiac Enzymes  Recent Labs  03/28/14 0900 03/28/14 1403 03/29/14 0910  TROPONINI <0.30 <0.30 <0.30   D-Dimer  Recent Labs  03/28/14 1402  DDIMER 0.97*    TELE  Sinus brady overnight with HR in 50s, occasional dip down to 30s-40s. HR increase to 60-70s  ECG  No EKG today  Radiology/Studies  Dg Chest 2 View  03/28/2014   CLINICAL DATA:  Chest pain  EXAM: CHEST  2 VIEW  COMPARISON:  12/07/2013  FINDINGS: Cardiac shadow is mildly enlarged.  Postsurgical changes are again seen. The lungs are well aerated bilaterally without focal infiltrate or sizable effusion. No acute bony abnormality is seen.  IMPRESSION: No active cardiopulmonary disease.   Electronically Signed   By: Inez Catalina M.D.   On: 03/28/2014 09:51   Nm Myocar Multi W/spect W/wall Motion / Ef  03/29/2014   CLINICAL DATA:  Chest pain  EXAM: MYOCARDIAL IMAGING WITH SPECT (REST AND PHARMACOLOGIC-STRESS)  GATED LEFT VENTRICULAR WALL MOTION STUDY  LEFT VENTRICULAR EJECTION FRACTION  TECHNIQUE: Standard myocardial SPECT imaging was performed after resting intravenous injection of 10 mCi Tc-43m sestamibi. Subsequently, intravenous infusion of Lexiscan was performed under the supervision of the Cardiology staff. At peak effect of the drug, 30 mCi Tc-86m sestamibi was injected intravenously and standard myocardial SPECT imaging was performed. Quantitative gated imaging was also performed to evaluate left ventricular wall  motion, and estimate left ventricular ejection fraction.  COMPARISON:  None.  FINDINGS: SPECT: There is a fixed defect at the apex. At the anterior edge, there is a perfusion defect which re- perfuses on rest imaging compatible with peri-infarct stress-induced ischemia.  Wall motion:  Normal motion  Ejection fraction: 49%. End-diastolic volume 182 cc. End systolic volume 68 cc.  IMPRESSION: There is stress-induced ischemia at the anterior edge of and apical infarct.   Electronically Signed   By: Maryclare Bean M.D.   On: 03/29/2014 13:45    ASSESSMENT AND PLAN  1. Dizziness   - previously presumed due to h/o vertigo  - however also have significant bradycardia although appears to be asymptomatic  - recent holter monitor with no significant bradycardia  - no BB or CCB  - if continue to have symptom, consider 30 days event monitor as outpatient with symptom journal to see if symptom related to bradycardia  - will ask nursing staff to walk the patient and see if his HR respond appropriately to exertion  2. Chest pain with moderate risk of acute coronary syndrome   - lexiscan stress test 03/29/2014 EF 49%, stress induced ischemia at anterior edge and apical infarct  - cath 03/30/2014 patent LIMA to LAD, SVG to D2, heavily diseased SVG to RV marginal branch however per Dr. Irish Lack no significant disease in native RCA and no ischemic on myoview, 80% mid LAD before D2. Continue medical therapy  - planning for discharge today if can ambulate with signficant drop in HR, if CP recur, add imdur (however would likely avoid with recent complain of dizziness)   - hold metformin for 48 hours after cath, can restart on 04/02/2014  3. OSA (obstructive sleep apnea)- recent eval, need C-pap  4. Diabetes mellitus  5. HTN 6. CVA (cerebral vascular accident)  7. Hyperlipidemia with target LDL less than 100  8. Sinus bradycardia- ? symptomatic  9. COPD with asthma  10. Diastolic dysfunction- grade 1 by echo June 2015, EF  50%    Signed, Woodward Ku Pager: [phone] Patient seen.  He is feeling well.  He is tolerating ambulation in the hall.  He has had no further severe dizzy spells.  If dizzy spells recur as an outpatient, would consider 30 day event monitor.  The patient already has an appointment to see Dr. Ron Parker later this month.  Continue current cardiac meds.

## 2014-03-30 NOTE — Discharge Summary (Signed)
Discharge Summary   Patient ID: [name],  MRN: [id number], DOB/AGE: [birth year] 80 y.o.  Admit date: 03/28/2014 Discharge date: 03/30/2014  Primary Care Provider: [facility] Primary Cardiologist: Dr. Ron Parker  Discharge Diagnoses Principal Problem:   Dizziness Active Problems:   Diabetes mellitus   Hypertensive cardiovascular disease   CVA (cerebral vascular accident)   Prostate cancer   Hyperlipidemia with target LDL less than 100   Ejection fraction   S/P AAA repair   S/P CABG x 3   Thrombocytopenia   Chest pain with moderate risk of acute coronary syndrome   Sinus bradycardia- ? symptomatic   Dyspnea- multifactorial   OSA (obstructive sleep apnea)- recent eval, need C-pap   COPD with asthma   Diastolic dysfunction- grade 1 by echo June 2015, EF 50%   Obesity   Vestibular dizziness   Dyspnea on exertion   Allergies Allergies  Allergen Reactions  . Erythromycin Other (See Comments)    Unknown  . Penicillins Other (See Comments)    Unknown  . Ketorolac Tromethamine Rash    Procedures  1. Lexiscan stress test  EXAM:  MYOCARDIAL IMAGING WITH SPECT (REST AND PHARMACOLOGIC-STRESS)  GATED LEFT VENTRICULAR WALL MOTION STUDY  LEFT VENTRICULAR EJECTION FRACTION  FINDINGS:  SPECT: There is a fixed defect at the apex. At the anterior edge,  there is a perfusion defect which re- perfuses on rest imaging  compatible with peri-infarct stress-induced ischemia.  Wall motion: Normal motion  Ejection fraction: 49%. End-diastolic volume 254 cc. End systolic  volume 68 cc.  IMPRESSION:  There is stress-induced ischemia at the anterior edge of and apical  infarct.  2. PROCEDURE: Left heart catheterization with selective coronary angiography, bypass angiography.  LEFT VENTRICULOGRAM: Left ventricular angiogram was not done. LVEDP was 10 mmHg.  IMPRESSIONS:  1. Patent left main coronary artery. 2. Severe mid vessel disease in the left anterior descending artery before  its large branches. Patent LIMA to LAD. Patent SVG to diagonal. 3. Widely patent left circumflex artery and its branches. 4. Moderate disease in the proximal to mid right coronary artery. Heavily diseased SVG to RV marginal branch as noted above. 5. LVEDP 10 mmHg.  RECOMMENDATION: Continue medical therapy. Although the SVG to RV marginal is narrowed, I don't think there is significant disease in the native right coronary artery. There is no ischemia in this territory noted by his nuclear study.  The first diagonal does not appear to be bypassed. However, it appears to adequately back fill from the SVG to second diagonal and LIMA to LAD. There is no ischemia on the nuclear study this territory either.  Possible discharge home later today. Consider adding antianginals the patient has more discomfort. Followup with Dr. Ron Parker.   Hospital Course  The patient is a 80 year old Caucasian male with past medical history significant for coronary artery disease status post bypass surgery in September 2012. He also has some dyspnea and was evaluated by pulmonology team. According to Dr. Halford Chessman, patient's dyspnea is most likely multifactorial. He also has a history of bradycardia despite not on beta blocker or calcium channel blocker. A 78-hour Holter monitor was done which did not show any significant sustained bradycardia.   Patient presented to the ED on 03/28/2014 after episodes with dizziness and diaphoresis getting up to the bathroom in the morning. He also endorsed some episodes of chest pressure as well. His heart rate was in the 40s when EMS arrived. He has prior history of dizziness associated with a vertigo. And  his symptom worsened when he turns his head. He was given 12.5 mg of Antivert in the ER with resolution of the symptom. He was admitted overnight for observation. On the same night of admission, patient had another episode of chest pain. Serial troponin was obtained which were negative. EKG was also  obtained which did not reveal any significant ST-T wave changes. He had an EKG on initial admission, when compared to the admission EKG, T wave inversion in the inferior lead had actually resolved. We monitored him overnight. He did have elevated d-dimer on admission, however his heart rate overnight has been in the 50s and 60s. Suspicion for pulmonary emboli is low.   Given his past medical history and his symptom, Lexiscan stress test was obtained on 03/29/2014 which did came back abnormal with EF 49%, stress-induced ischemia at the anterior edge and apical infarct. He underwent cardiac catheterization on 03/30/2014 which revealed patent LIMA to LAD, SVG to D2, heavy diseased SVG to RV marginal branch however no significant disease in the native RCA nor did Myoview correspond to the RCA region, 80% mid LAD stenosis before D2 however distal area back filled by vein graft. Medical therapy was recommended. Overnight, patient had no further chest pain or shortness of breath. When he was seen in the morning of 03/30/2014, he was laying comfortably in bed without any further acute complaints. Left radial cath site appears to be stable without significant hematoma or bleeding. Family has been concerned that his dizziness is not truly related to a vertigo and may be in part related to bradycardia. Telemetry overnight revealed his heart rate was mainly in the high 40s to 50s, however it does occasionally get down to the 30s. He was asymptomatic overnight. During the day, his heart rate remained in the 50s to 70s range with occasional intermittent bradycardia down to the high 40s as well. I have asked the nursing staff to walk the patient. His heart rate responded normally with exertion without significant symptom. He is deemed stable for discharge from cardiology perspective. He has a followup with Dr. Ron Parker in the clinic within a month. During the mean time, if the patient has any recurrent dizziness, we will consider for 30  days event monitor as outpatient, however will defer this decision to Dr. Ron Parker.  I will advise the patient to hold his metformin for another 48 hours after cardiac catheterization and contrast dye use. He can resume his metformin on 04/02/2014.  Discharge Vitals Blood pressure 136/70, pulse 42, temperature 97.5 F (36.4 C), temperature source Oral, resp. rate 16, height 5\' 11"  (1.803 m), weight 220 lb 9.6 oz (100.064 kg), SpO2 96.00%.  Filed Weights   03/29/14 0540 03/29/14 1938 03/30/14 0613  Weight: 214 lb 3.2 oz (97.16 kg) 216 lb 8 oz (98.204 kg) 220 lb 9.6 oz (100.064 kg)    Labs  CBC  Recent Labs  03/28/14 0900  WBC 5.6  HGB 14.3  HCT 42.5  MCV 95.9  PLT 79*   Basic Metabolic Panel  Recent Labs  03/28/14 0900  NA 142  K 4.6  CL 105  CO2 27  GLUCOSE 176*  BUN 16  CREATININE 0.87  CALCIUM 9.1   Liver Function Tests  Recent Labs  03/28/14 0900  AST 19  ALT 19  ALKPHOS 67  BILITOT 1.1  PROT 6.4  ALBUMIN 3.9   Cardiac Enzymes  Recent Labs  03/28/14 0900 03/28/14 1403 03/29/14 0910  TROPONINI <0.30 <0.30 <0.30   BNP No  components found with this basename: POCBNP,  D-Dimer  Recent Labs  03/28/14 1402  DDIMER 0.97*    Disposition  Pt is being discharged home today in good condition.  Follow-up Plans & Appointments      Follow-up Information   Follow up with Carlena Bjornstad, MD On 04/19/2014. (3pm. Note above: [place] address has changed to above address)    Contact information:   [place], [place]      Discharge Medications    Medication List         aspirin 81 MG tablet  Take 81 mg by mouth daily.     BAYER CONTOUR TEST test strip  Generic drug:  glucose blood  USE ONE STRIP TWICE DAILY TO TEST BLOOD  SUGAR LEVELS     budesonide-formoterol 160-4.5 MCG/ACT inhaler  Commonly known as:  SYMBICORT  Inhale 2 puffs into the lungs 2 (two) times daily.     losartan 50 MG tablet  Commonly known as:   COZAAR  Take 25 mg by mouth daily.     meclizine 12.5 MG tablet  Commonly known as:  ANTIVERT  Take 1 tablet (12.5 mg total) by mouth 3 (three) times daily as needed for dizziness.     metFORMIN 500 MG tablet  Commonly known as:  GLUCOPHAGE  Take 1 tablet (500 mg total) by mouth 2 (two) times daily with a meal.  Start taking on:  04/02/2014     naproxen sodium 220 MG tablet  Commonly known as:  ANAPROX  Take 220 mg by mouth 2 (two) times daily with a meal.     rosuvastatin 10 MG tablet  Commonly known as:  CRESTOR  Take 10 mg by mouth every other day.         Duration of Discharge Encounter   Greater than 30 minutes including physician time.  Signed, Almyra Deforest PA-C 03/30/2014, 1:50 PM

## 2014-03-30 NOTE — CV Procedure (Signed)
PROCEDURE:  Left heart catheterization with selective coronary angiography, bypass angiography.  INDICATIONS:  Unstable angina; abnormal stress test  The risks, benefits, and details of the procedure were explained to the patient.  The patient verbalized understanding and wanted to proceed.  Informed written consent was obtained.  PROCEDURE TECHNIQUE:  After Xylocaine anesthesia a 37F slender sheath was placed in the left radial artery with a single anterior needle wall stick. IV heparin was given.  Right coronary angiography was done using a Judkins R4 guide catheter.  Left coronary angiography was done using a Judkins L4 guide catheter.  The SVG to diagonal was engaged with a JR 4 catheter. The SVG to RCA marginal branch was engaged with a multipurpose catheter. The LIMA to LAD was engaged with an IMA catheter. Left heart catheterization was done using a pigtail catheter.  A TR band was used for hemostasis.   CONTRAST:  Total of 115 cc.  COMPLICATIONS:  None.    HEMODYNAMICS:  Aortic pressure was 136/49; LV pressure was 137/3; LVEDP 10.  There was no gradient between the left ventricle and aorta.    ANGIOGRAPHIC DATA:   The left main coronary artery is patent with mild disease.  The left anterior descending artery is a large vessel proximally.  Just before the origin of 2 large diagonals, there is a focal, calcific, 80% stenosis There is competitive flow noted with native injection in the mid to distal vessel. The mid to distal vessel is widely patent with diffuse disease. There is a large second diagonal which also has competitive flow. At the insertion site of the SVG, there is moderate disease.  The first diagonal is large and does not appear to be bypassed. There appears to be backfilling of this first diagonal from the SVG to the second diagonal.  The left circumflex artery is a medium size vessel. There is mild disease proximally. There is a large OM1 which is widely patent. The  remainder of the circumflex is widely patent.  The right coronary artery is a large dominant vessel. Proximally, there is mild to moderate disease. This is essentially unchanged from the prior cath before his bypass surgery. In the large acute marginal branch, competitive flow is noted. The posterior lateral artery is a large vessel with mild, diffuse disease.  The LIMA to LAD is widely patent.  The SVG to diagonal is widely patent.  The SVG to acute marginal has a long, tubular, severe stenosis in the proximal to mid graft, up to 90%.  LEFT VENTRICULOGRAM:  Left ventricular angiogram was not done.  LVEDP was 10 mmHg.  IMPRESSIONS:  1. Patent left main coronary artery. 2. Severe mid vessel disease in the left anterior descending artery before its large branches.  Patent LIMA to LAD. Patent SVG to diagonal. 3. Widely patent left circumflex artery and its branches. 4. Moderate disease in the proximal to mid right coronary artery.  Heavily diseased SVG to RV marginal branch as noted above. 5. LVEDP 10 mmHg.    RECOMMENDATION:  Continue medical therapy. Although the SVG to RV marginal is narrowed, I don't think there is significant disease in the native right coronary artery. There is no ischemia in this territory noted by his nuclear study.  The first diagonal does not appear to be bypassed.  However, it appears to adequately back fill from the SVG to second diagonal and LIMA to LAD. There is no ischemia on the nuclear study this territory either.  Possible discharge home later today. Consider adding antianginals the patient has more discomfort.  Followup with Dr. Ron Parker.

## 2014-03-30 NOTE — Interval H&P Note (Signed)
Cath Lab Visit (complete for each Cath Lab visit)  Clinical Evaluation Leading to the Procedure:   ACS: Yes.    Non-ACS:    Anginal Classification: CCS IV  Anti-ischemic medical therapy: Minimal Therapy (1 class of medications)  Non-Invasive Test Results: Intermediate-risk stress test findings: cardiac mortality 1-3%/year  Prior CABG: Previous CABG      History and Physical Interval Note:  03/30/2014 7:57 AM  [name]  has presented today for surgery, with the diagnosis of cp  The various methods of treatment have been discussed with the patient and family. After consideration of risks, benefits and other options for treatment, the patient has consented to  Procedure(s): LEFT HEART CATHETERIZATION WITH CORONARY ANGIOGRAM (N/A) as a surgical intervention .  The patient's history has been reviewed, patient examined, no change in status, stable for surgery.  I have reviewed the patient's chart and labs.  Questions were answered to the patient's satisfaction.     Saron Vanorman S.

## 2014-04-06 ENCOUNTER — Other Ambulatory Visit: Payer: Self-pay | Admitting: Nurse Practitioner

## 2014-04-19 ENCOUNTER — Encounter: Payer: Self-pay | Admitting: Cardiology

## 2014-04-19 ENCOUNTER — Ambulatory Visit (INDEPENDENT_AMBULATORY_CARE_PROVIDER_SITE_OTHER): Payer: Commercial Managed Care - HMO | Admitting: Cardiology

## 2014-04-19 VITALS — BP 117/71 | HR 55 | Ht 70.0 in | Wt 216.0 lb

## 2014-04-19 DIAGNOSIS — G4733 Obstructive sleep apnea (adult) (pediatric): Secondary | ICD-10-CM

## 2014-04-19 DIAGNOSIS — R001 Bradycardia, unspecified: Secondary | ICD-10-CM

## 2014-04-19 DIAGNOSIS — R0989 Other specified symptoms and signs involving the circulatory and respiratory systems: Secondary | ICD-10-CM

## 2014-04-19 DIAGNOSIS — I25810 Atherosclerosis of coronary artery bypass graft(s) without angina pectoris: Secondary | ICD-10-CM

## 2014-04-19 DIAGNOSIS — R9430 Abnormal result of cardiovascular function study, unspecified: Secondary | ICD-10-CM

## 2014-04-19 DIAGNOSIS — R42 Dizziness and giddiness: Secondary | ICD-10-CM | POA: Diagnosis not present

## 2014-04-19 DIAGNOSIS — I498 Other specified cardiac arrhythmias: Secondary | ICD-10-CM | POA: Diagnosis not present

## 2014-04-19 NOTE — Assessment & Plan Note (Addendum)
The patient has had marked bradycardia. He did have a 48 hour monitor in July, 2015. This showed bradycardia while he was resting. He has continued to have multiple episodes with marked symptoms. We need to assess his rhythm further. We will have him wear a 21 day event recorder. I feel strongly that this is indicated medically.  As part of today's evaluation I spent greater than 25 minutes with his total care. More than half of this time was spent with direct contact with the patient his daughter and his wife.

## 2014-04-19 NOTE — Assessment & Plan Note (Signed)
The patient's daughter and I continued to encourage the patient to do everything he can to try to tolerate his CPAP. This may be playing a role with his overall problems.

## 2014-04-19 NOTE — Patient Instructions (Signed)
Your physician recommends that you schedule a follow-up appointment in: June 05, 2014 with Dr. Ron Parker. Your physician recommends that you continue on your current medications as directed. Please refer to the Current Medication list given to you today. Your physician has recommended that you wear an event monitor. Event monitors are medical devices that record the heart's electrical activity. Doctors most often [place] these monitors to diagnose arrhythmias. Arrhythmias are problems with the speed or rhythm of the heartbeat. The monitor is a small, portable device. You can wear one while you do your normal daily activities. This is usually used to diagnose what is causing palpitations/syncope (passing out). [employer] will contact you about this monitor.

## 2014-04-19 NOTE — Assessment & Plan Note (Signed)
He had an abnormal nuclear study in the hospital. Because of this catheterization was done. There is disease in the SVG to the RV marginal but the RCA is patent. There is an 80% mid LAD lesion before the second diagonal. This was not felt to be causing his problems. The distal area back fills from the vein graft. Medical therapy was recommended.

## 2014-04-19 NOTE — Assessment & Plan Note (Signed)
The patient has had marked spells with dizziness and diaphoresis. He does have bradycardia. We have not yet proven that it is the cause but it is quite concerning. Further evaluation is needed.
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
## 2014-04-20 ENCOUNTER — Other Ambulatory Visit: Payer: Self-pay | Admitting: *Deleted

## 2014-04-20 DIAGNOSIS — R42 Dizziness and giddiness: Secondary | ICD-10-CM

## 2014-04-20 DIAGNOSIS — R001 Bradycardia, unspecified: Secondary | ICD-10-CM

## 2014-04-22 ENCOUNTER — Other Ambulatory Visit: Payer: Self-pay | Admitting: Nurse Practitioner

## 2014-04-24 DIAGNOSIS — R42 Dizziness and giddiness: Secondary | ICD-10-CM

## 2014-05-05 ENCOUNTER — Encounter: Payer: Self-pay | Admitting: Nurse Practitioner

## 2014-05-05 ENCOUNTER — Ambulatory Visit (INDEPENDENT_AMBULATORY_CARE_PROVIDER_SITE_OTHER): Payer: Medicare HMO | Admitting: Nurse Practitioner

## 2014-05-05 ENCOUNTER — Telehealth: Payer: Self-pay | Admitting: Family Medicine

## 2014-05-05 VITALS — BP 127/68 | HR 77 | Temp 97.1°F | Ht 70.0 in | Wt 217.2 lb

## 2014-05-05 DIAGNOSIS — J441 Chronic obstructive pulmonary disease with (acute) exacerbation: Secondary | ICD-10-CM

## 2014-05-05 DIAGNOSIS — J019 Acute sinusitis, unspecified: Secondary | ICD-10-CM

## 2014-05-05 MED ORDER — AZITHROMYCIN 250 MG PO TABS: ORAL_TABLET | ORAL | Status: DC

## 2014-05-05 NOTE — Progress Notes (Signed)
   Subjective:    Patient ID: AH BOTT, male    DOB: 05-11-1933, 80 y.o.   MRN: [id number]  HPI Patient in today with wife c/o cough and congestion. Started Monday with runny nose and sore throat- now he has bad cough with low grade fever last night.    Review of Systems  Constitutional: Positive for fever and appetite change (decreased). Negative for chills.  HENT: Positive for congestion, rhinorrhea, sinus pressure and sore throat.   Respiratory: Positive for cough (nonproductive).   Cardiovascular: Negative.   Gastrointestinal: Negative.   Genitourinary: Negative.   Skin: Negative.   Neurological: Negative.   Psychiatric/Behavioral: Negative.   All other systems reviewed and are negative.      Objective:   Physical Exam  Constitutional: He is oriented to person, place, and time. He appears well-developed and well-nourished. No distress.  HENT:  Right Ear: Hearing, tympanic membrane, external ear and ear canal normal.  Left Ear: Hearing, tympanic membrane, external ear and ear canal normal.  Nose: Mucosal edema and rhinorrhea present. Right sinus exhibits maxillary sinus tenderness. Right sinus exhibits no frontal sinus tenderness. Left sinus exhibits maxillary sinus tenderness. Left sinus exhibits no frontal sinus tenderness.  Mouth/Throat: Uvula is midline, oropharynx is clear and moist and mucous membranes are normal.  Eyes: Pupils are equal, round, and reactive to light.  Neck: Normal range of motion. Neck supple.  Cardiovascular: Normal rate, regular rhythm and normal heart sounds.   Pulmonary/Chest: Effort normal. He has rales (fine bil bases).  Deep wet cough  Abdominal: Soft. Bowel sounds are normal.  Lymphadenopathy:    He has no cervical adenopathy.  Neurological: He is alert and oriented to person, place, and time.  Skin: Skin is warm and dry.  Psychiatric: He has a normal mood and affect. His behavior is normal. Judgment and thought content normal.  BP  127/68  Pulse 77  Temp(Src) 97.1 F (36.2 C) (Oral)  Ht 5\' 10"  (1.778 m)  Wt 217 lb 3.2 oz (98.521 kg)  BMI 31.16 kg/m2         Assessment & Plan:   1. Acute rhinosinusitis   2. COPD exacerbation    Meds ordered this encounter  Medications  . azithromycin (ZITHROMAX Z-PAK) 250 MG tablet    Sig: As directed    Dispense:  6 each    Refill:  0    Order Specific Question:  Supervising Provider    Answer:  Chipper Herb [[id number]]   1. Take meds as prescribed 2. Use a cool mist humidifier especially during the winter months and when heat has been humid. 3. Use saline nose sprays frequently 4. Saline irrigations of the nose can be very helpful if done frequently.  * 4X daily for 1 week*  * Use of a nettie pot can be helpful with this. Follow directions with this* 5. Drink plenty of fluids 6. Keep thermostat turn down low 7.For any cough or congestion  Use plain Mucinex- regular strength or max strength is fine   * Children- consult with Pharmacist for dosing 8. For fever or aces or pains- take tylenol or ibuprofen appropriate for age and weight.  * for fevers greater than 101 orally you may alternate ibuprofen and tylenol every  3 hours.   [name] [name], [name]

## 2014-05-05 NOTE — Telephone Encounter (Signed)
Appt given for today 

## 2014-05-05 NOTE — Patient Instructions (Signed)

## 2014-05-10 ENCOUNTER — Encounter: Payer: Self-pay | Admitting: Pulmonary Disease

## 2014-05-17 ENCOUNTER — Encounter: Payer: Self-pay | Admitting: Nurse Practitioner

## 2014-05-17 ENCOUNTER — Ambulatory Visit (INDEPENDENT_AMBULATORY_CARE_PROVIDER_SITE_OTHER): Payer: Medicare HMO | Admitting: Nurse Practitioner

## 2014-05-17 ENCOUNTER — Ambulatory Visit: Payer: Commercial Managed Care - HMO | Admitting: Pulmonary Disease

## 2014-05-17 VITALS — BP 138/72 | HR 62 | Temp 97.3°F | Ht 70.0 in | Wt 218.4 lb

## 2014-05-17 DIAGNOSIS — E1149 Type 2 diabetes mellitus with other diabetic neurological complication: Secondary | ICD-10-CM

## 2014-05-17 DIAGNOSIS — Z6829 Body mass index (BMI) 29.0-29.9, adult: Secondary | ICD-10-CM | POA: Insufficient documentation

## 2014-05-17 DIAGNOSIS — D696 Thrombocytopenia, unspecified: Secondary | ICD-10-CM

## 2014-05-17 DIAGNOSIS — J449 Chronic obstructive pulmonary disease, unspecified: Secondary | ICD-10-CM

## 2014-05-17 DIAGNOSIS — I110 Hypertensive heart disease with heart failure: Secondary | ICD-10-CM

## 2014-05-17 DIAGNOSIS — Z6831 Body mass index (BMI) 31.0-31.9, adult: Secondary | ICD-10-CM

## 2014-05-17 DIAGNOSIS — I6350 Cerebral infarction due to unspecified occlusion or stenosis of unspecified cerebral artery: Secondary | ICD-10-CM

## 2014-05-17 DIAGNOSIS — E785 Hyperlipidemia, unspecified: Secondary | ICD-10-CM

## 2014-05-17 DIAGNOSIS — R0600 Dyspnea, unspecified: Secondary | ICD-10-CM

## 2014-05-17 DIAGNOSIS — R0989 Other specified symptoms and signs involving the circulatory and respiratory systems: Secondary | ICD-10-CM

## 2014-05-17 DIAGNOSIS — I25810 Atherosclerosis of coronary artery bypass graft(s) without angina pectoris: Secondary | ICD-10-CM

## 2014-05-17 DIAGNOSIS — I639 Cerebral infarction, unspecified: Secondary | ICD-10-CM

## 2014-05-17 DIAGNOSIS — R0609 Other forms of dyspnea: Secondary | ICD-10-CM

## 2014-05-17 DIAGNOSIS — I509 Heart failure, unspecified: Secondary | ICD-10-CM

## 2014-05-17 DIAGNOSIS — G4733 Obstructive sleep apnea (adult) (pediatric): Secondary | ICD-10-CM

## 2014-05-17 LAB — POCT GLYCOSYLATED HEMOGLOBIN (HGB A1C): HEMOGLOBIN A1C: 5.8

## 2014-05-17 MED ORDER — ROSUVASTATIN CALCIUM 10 MG PO TABS: 10.0000 mg | ORAL_TABLET | ORAL | Status: DC

## 2014-05-17 MED ORDER — LOSARTAN POTASSIUM 50 MG PO TABS: 25.0000 mg | ORAL_TABLET | Freq: Every day | ORAL | Status: DC

## 2014-05-17 MED ORDER — METFORMIN HCL 500 MG PO TABS: 500.0000 mg | ORAL_TABLET | Freq: Two times a day (BID) | ORAL | Status: DC

## 2014-05-17 NOTE — Patient Instructions (Signed)

## 2014-05-17 NOTE — Progress Notes (Signed)
Subjective:    Patient ID: RYDAN GULYAS, male    DOB: [birth year], 80 y.o.   MRN: [id number]  Patient here today for follow up of chronic medical problems. Checking blood glucose regularly ranging in the 100's . BP averaging 130/80's. He saw his cardiologist recently and he is wearing a cardiac monitor for the month. He is following up with his cardiologist next month.   Cough Associated symptoms include shortness of breath. Pertinent negatives include no chest pain.  Diabetes He presents for his follow-up diabetic visit. He has type 2 diabetes mellitus. His disease course has been stable. There are no hypoglycemic associated symptoms. Pertinent negatives for diabetes include no chest pain, no foot ulcerations and no visual change. There are no hypoglycemic complications. Symptoms are stable. There are no diabetic complications. Risk factors for coronary artery disease include diabetes mellitus, dyslipidemia, male sex, hypertension and family history. Current diabetic treatment includes oral agent (monotherapy). He is compliant with treatment all of the time. His weight is stable. He is following a generally healthy diet. He participates in exercise intermittently. His home blood glucose trend is fluctuating minimally. His breakfast blood glucose is taken between 6-7 am. His breakfast blood glucose range is generally 110-130 mg/dl. His highest blood glucose is 180-200 mg/dl. His overall blood glucose range is 130-140 mg/dl. An ACE inhibitor/angiotensin II receptor blocker is not being taken. He does not see a podiatrist.Eye exam is current.  Hyperlipidemia This is a chronic problem. The current episode started more than 1 year ago. The problem is controlled. Recent lipid tests were reviewed and are normal. Exacerbating diseases include diabetes. Associated symptoms include shortness of breath. Pertinent negatives include no chest pain. Current antihyperlipidemic treatment includes statins. The current  treatment provides significant improvement of lipids. Risk factors for coronary artery disease include diabetes mellitus, dyslipidemia, family history, hypertension and male sex.  Hypertension This is a chronic problem. The current episode started more than 1 year ago. The problem has been waxing and waning since onset. The problem is controlled. Associated symptoms include shortness of breath. Pertinent negatives include no chest pain or palpitations. Risk factors for coronary artery disease include diabetes mellitus, dyslipidemia, family history and male gender. Past treatments include beta blockers. The current treatment provides mild improvement. There is no history of a thyroid problem.      Review of Systems  Respiratory: Positive for cough and shortness of breath.   Cardiovascular: Negative for chest pain and palpitations.  Musculoskeletal: Positive for gait problem.       Uses a cane.   Neurological: Negative for numbness.  All other systems reviewed and are negative.      Objective:   Physical Exam  Constitutional: He is oriented to person, place, and time. He appears well-developed and well-nourished.  HENT:  Head: Normocephalic.  Right Ear: External ear normal.  Left Ear: External ear normal.  Eyes: Pupils are equal, round, and reactive to light.  Neck: Normal range of motion. Neck supple.  Cardiovascular: Normal rate, regular rhythm, normal heart sounds and intact distal pulses.   Pulmonary/Chest: Effort normal and breath sounds normal.  Abdominal: Soft. Bowel sounds are normal. There is no tenderness.  Musculoskeletal: Normal range of motion. He exhibits no edema.  Neurological: He is alert and oriented to person, place, and time.  Skin: Skin is warm and dry.  Psychiatric: He has a normal mood and affect. His behavior is normal. Judgment and thought content normal.    BP 138/72  Pulse 62  Temp(Src) 97.3 F (36.3 C) (Oral)  Ht $R'5\' 10"'fI$  (1.778 m)  Wt 218 lb 6.4 oz  (99.066 kg)  BMI 31.34 kg/m2  Results for orders placed in visit on 05/17/14  POCT GLYCOSYLATED HEMOGLOBIN (HGB A1C)      Result Value Ref Range   Hemoglobin A1C 5.8         Assessment & Plan:  1. Hyperlipidemia with target LDL less than 100 - CMP14+EGFR - NMR, lipoprofile - rosuvastatin (CRESTOR) 10 MG tablet; Take 1 tablet (10 mg total) by mouth every other day.  Dispense: 10 tablet; Refill: 3  2. Thrombocytopenia  3. Coronary artery disease involving autologous vein coronary bypass graft without angina pectoris  4. OSA (obstructive sleep apnea)  5. COPD with asthma  6. CVA (cerebral vascular accident)  7. Dyspnea on exertion   8. Type II or unspecified type diabetes mellitus with neurological manifestations, not stated as uncontrolled - POCT glycosylated hemoglobin (Hb A1C) - metFORMIN (GLUCOPHAGE) 500 MG tablet; Take 1 tablet (500 mg total) by mouth 2 (two) times daily with a meal.  9. BMI 31.0-31.9,adult  10. Hypertensive heart disease with heart failure - losartan (COZAAR) 50 MG tablet; Take 0.5 tablets (25 mg total) by mouth daily.  Dispense: 30 tablet; Refill: 3    Labs pending Health maintenance reviewed Diet and exercise encouraged Continue all meds Follow up  In 3 months PRN   [name] [name], [name]

## 2014-05-18 ENCOUNTER — Telehealth: Payer: Self-pay | Admitting: Family Medicine

## 2014-05-18 LAB — CMP14+EGFR
A/G RATIO: 2.1 (ref 1.1–2.5)
ALBUMIN: 4.2 g/dL (ref 3.5–4.8)
ALT: 16 IU/L (ref 0–44)
AST: 16 IU/L (ref 0–40)
Alkaline Phosphatase: 73 IU/L (ref 39–117)
BUN/Creatinine Ratio: 17 (ref 10–22)
BUN: 15 mg/dL (ref 8–27)
CALCIUM: 9.4 mg/dL (ref 8.6–10.2)
CO2: 27 mmol/L (ref 18–29)
CREATININE: 0.89 mg/dL (ref 0.76–1.27)
Chloride: 102 mmol/L (ref 97–108)
GFR calc Af Amer: 94 mL/min/{1.73_m2} (ref >59)
GFR, EST NON AFRICAN AMERICAN: 81 mL/min/{1.73_m2} (ref >59)
GLOBULIN, TOTAL: 2 g/dL (ref 1.5–4.5)
GLUCOSE: 128 mg/dL — AB (ref 65–99)
Potassium: 4.8 mmol/L (ref 3.5–5.2)
Sodium: 141 mmol/L (ref 134–144)
Total Bilirubin: 0.9 mg/dL (ref 0.0–1.2)
Total Protein: 6.2 g/dL (ref 6.0–8.5)

## 2014-05-18 LAB — NMR, LIPOPROFILE
Cholesterol: 101 mg/dL (ref 100–199)
HDL CHOLESTEROL BY NMR: 28 mg/dL — AB (ref >39)
HDL PARTICLE NUMBER: 27.1 umol/L — AB (ref >=30.5)
LDL Particle Number: 430 nmol/L (ref <1000)
LDL Size: 19.7 nm (ref >20.5)
LDLC SERPL CALC-MCNC: 43 mg/dL (ref 0–99)
LP-IR Score: 64 — ABNORMAL HIGH (ref <=45)
SMALL LDL PARTICLE NUMBER: 274 nmol/L (ref <=527)
TRIGLYCERIDES BY NMR: 149 mg/dL (ref 0–149)

## 2014-05-18 NOTE — Telephone Encounter (Signed)
Message copied by Waverly Ferrari on Thu May 18, 2014 10:15 AM ------      Message from: [name]      Created: Thu May 18, 2014 10:11 AM       Hgba1c discussed at appointment      Kidney and liver function stable       cholesterol looks great      Continue current meds- low fat diet and exercise and recheck in 3 months             ------

## 2014-05-20 ENCOUNTER — Telehealth: Payer: Self-pay | Admitting: Nurse Practitioner

## 2014-05-20 ENCOUNTER — Other Ambulatory Visit: Payer: Self-pay | Admitting: Nurse Practitioner

## 2014-05-22 ENCOUNTER — Other Ambulatory Visit: Payer: Self-pay | Admitting: Family Medicine

## 2014-05-22 DIAGNOSIS — E1149 Type 2 diabetes mellitus with other diabetic neurological complication: Secondary | ICD-10-CM

## 2014-05-22 MED ORDER — GLUCOSE BLOOD VI STRP: ORAL_STRIP | Status: DC

## 2014-05-22 MED ORDER — METFORMIN HCL 500 MG PO TABS: 500.0000 mg | ORAL_TABLET | Freq: Two times a day (BID) | ORAL | Status: DC

## 2014-05-22 NOTE — Telephone Encounter (Signed)
refills sent over

## 2014-05-25 ENCOUNTER — Telehealth: Payer: Self-pay | Admitting: Nurse Practitioner

## 2014-05-25 MED ORDER — GLUCOSE BLOOD VI STRP: ORAL_STRIP | Status: DC

## 2014-05-25 NOTE — Telephone Encounter (Signed)
done

## 2014-06-01 ENCOUNTER — Telehealth: Payer: Self-pay | Admitting: Nurse Practitioner

## 2014-06-01 NOTE — Telephone Encounter (Signed)
Aware, 2 samples symbicort ready.

## 2014-06-05 ENCOUNTER — Ambulatory Visit (INDEPENDENT_AMBULATORY_CARE_PROVIDER_SITE_OTHER): Payer: Commercial Managed Care - HMO | Admitting: Cardiology

## 2014-06-05 ENCOUNTER — Encounter: Payer: Self-pay | Admitting: Cardiology

## 2014-06-05 VITALS — BP 146/76 | HR 70 | Ht 71.0 in | Wt 218.0 lb

## 2014-06-05 DIAGNOSIS — I493 Ventricular premature depolarization: Secondary | ICD-10-CM | POA: Insufficient documentation

## 2014-06-05 DIAGNOSIS — R42 Dizziness and giddiness: Secondary | ICD-10-CM

## 2014-06-05 DIAGNOSIS — R001 Bradycardia, unspecified: Secondary | ICD-10-CM

## 2014-06-05 DIAGNOSIS — G4733 Obstructive sleep apnea (adult) (pediatric): Secondary | ICD-10-CM

## 2014-06-05 NOTE — Assessment & Plan Note (Signed)
Coronary disease is stable. He's post CABG in the past. His cath in August, 2015 showed that he was stable overall. No further workup at this time.

## 2014-06-05 NOTE — Assessment & Plan Note (Signed)
The patient is not happy with hysteroscopy CPAP for his sleep apnea. This was the main topics he wanted to talk about today. I repeatedly let him know that I hope you would try all types of CPAP before deciding he would not use it

## 2014-06-05 NOTE — Assessment & Plan Note (Signed)
His monitor shows scattered PVCs with no symptoms. He is not having recurrent significant dizziness. Bradycardia does not appear to be causing any significant problems. No further workup.

## 2014-06-05 NOTE — Patient Instructions (Signed)
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
## 2014-06-28 ENCOUNTER — Ambulatory Visit: Payer: Commercial Managed Care - HMO | Admitting: Pulmonary Disease

## 2014-07-11 ENCOUNTER — Other Ambulatory Visit: Payer: Self-pay | Admitting: Nurse Practitioner

## 2014-07-11 NOTE — Telephone Encounter (Signed)
z pak will not help flu- if thinks as flu- NTBS

## 2014-07-11 NOTE — Telephone Encounter (Signed)
Aware,ntbs.

## 2014-07-16 ENCOUNTER — Other Ambulatory Visit: Payer: Self-pay | Admitting: Nurse Practitioner

## 2014-07-19 ENCOUNTER — Other Ambulatory Visit: Payer: Self-pay | Admitting: *Deleted

## 2014-07-19 ENCOUNTER — Other Ambulatory Visit: Payer: Self-pay | Admitting: Nurse Practitioner

## 2014-07-19 MED ORDER — GLUCOSE BLOOD VI STRP: ORAL_STRIP | Status: DC

## 2014-07-19 NOTE — Telephone Encounter (Signed)
Spoke with wife and advised that rx was originally sent in on 07-17-14 but it printed instead of going electronically. Resent in to pharmacy. Wife verbalized understanding

## 2014-07-24 ENCOUNTER — Other Ambulatory Visit: Payer: Self-pay | Admitting: Nurse Practitioner

## 2014-07-24 ENCOUNTER — Telehealth: Payer: Self-pay | Admitting: Nurse Practitioner

## 2014-07-24 MED ORDER — GLUCOSE BLOOD VI STRP: ORAL_STRIP | Status: DC

## 2014-08-02 ENCOUNTER — Encounter (HOSPITAL_COMMUNITY): Payer: Self-pay | Admitting: Cardiovascular Disease

## 2014-08-03 ENCOUNTER — Encounter (HOSPITAL_COMMUNITY): Payer: Self-pay | Admitting: Interventional Cardiology

## 2014-08-28 ENCOUNTER — Ambulatory Visit: Payer: Medicare HMO | Admitting: Nurse Practitioner

## 2014-09-22 ENCOUNTER — Ambulatory Visit (INDEPENDENT_AMBULATORY_CARE_PROVIDER_SITE_OTHER): Payer: Commercial Managed Care - HMO | Admitting: Nurse Practitioner

## 2014-09-22 ENCOUNTER — Encounter: Payer: Self-pay | Admitting: Nurse Practitioner

## 2014-09-22 VITALS — BP 132/86 | HR 63 | Temp 96.7°F | Ht 71.0 in | Wt 217.0 lb

## 2014-09-22 DIAGNOSIS — Z23 Encounter for immunization: Secondary | ICD-10-CM

## 2014-09-22 DIAGNOSIS — E1142 Type 2 diabetes mellitus with diabetic polyneuropathy: Secondary | ICD-10-CM | POA: Diagnosis not present

## 2014-09-22 DIAGNOSIS — E785 Hyperlipidemia, unspecified: Secondary | ICD-10-CM

## 2014-09-22 DIAGNOSIS — I509 Heart failure, unspecified: Secondary | ICD-10-CM | POA: Diagnosis not present

## 2014-09-22 DIAGNOSIS — I110 Hypertensive heart disease with heart failure: Secondary | ICD-10-CM | POA: Diagnosis not present

## 2014-09-22 DIAGNOSIS — E1140 Type 2 diabetes mellitus with diabetic neuropathy, unspecified: Secondary | ICD-10-CM | POA: Diagnosis not present

## 2014-09-22 DIAGNOSIS — E669 Obesity, unspecified: Secondary | ICD-10-CM

## 2014-09-22 LAB — POCT UA - MICROALBUMIN: MICROALBUMIN (UR) POC: 20 mg/L

## 2014-09-22 LAB — POCT GLYCOSYLATED HEMOGLOBIN (HGB A1C)

## 2014-09-22 MED ORDER — METFORMIN HCL 500 MG PO TABS: 500.0000 mg | ORAL_TABLET | Freq: Two times a day (BID) | ORAL | Status: DC

## 2014-09-22 NOTE — Addendum Note (Signed)
Addended by: Rolena Infante on: 09/22/2014 11:58 AM   Modules accepted: Orders

## 2014-09-22 NOTE — Patient Instructions (Signed)

## 2014-09-22 NOTE — Addendum Note (Signed)
Addended by: Earlene Plater on: 09/22/2014 12:09 PM   Modules accepted: Orders

## 2014-09-22 NOTE — Progress Notes (Signed)
Subjective:    Patient ID: [name], male    DOB: Mar 29, 1933, 81 y.o.   MRN: [id number]  Patient here today for follow up of chronic medical problems. Checking blood glucose regularly ranging in the 100's . BP averaging 130/80's. His only complaint  Today is fatigue that has been going on for awhile.   Diabetes He presents for his follow-up diabetic visit. He has type 2 diabetes mellitus. No MedicAlert identification noted. His disease course has been improving. There are no hypoglycemic associated symptoms. Pertinent negatives for diabetes include no visual change. Risk factors for coronary artery disease include diabetes mellitus, dyslipidemia, family history, male sex, obesity and sedentary lifestyle. Current diabetic treatment includes oral agent (monotherapy). He is compliant with treatment some of the time. His weight is stable. When asked about meal planning, he reported none. He never participates in exercise. His breakfast blood glucose is taken between 8-9 am. His breakfast blood glucose range is generally 130-140 mg/dl. An ACE inhibitor/angiotensin II receptor blocker is not being taken. He does not see a podiatrist.Eye exam is not current.  Hyperlipidemia This is a chronic problem. The current episode started more than 1 year ago. The problem is uncontrolled. Recent lipid tests were reviewed and are variable. He is currently on no antihyperlipidemic treatment. The current treatment provides moderate improvement of lipids. Compliance problems include adherence to diet and adherence to exercise.  Risk factors for coronary artery disease include diabetes mellitus, dyslipidemia, hypertension, male sex and obesity.  Hypertension This is a chronic problem. The current episode started more than 1 year ago. The problem is unchanged. The problem is uncontrolled. Pertinent negatives include no palpitations. Risk factors for coronary artery disease include diabetes mellitus, dyslipidemia, male  gender, obesity and sedentary lifestyle. Past treatments include nothing. Hypertensive end-organ damage includes CAD/MI and heart failure.  COPD symbicort helps- has some sob with exertion.  * Hx of CVA, AAA and CAD- sees cardiologist  Review of Systems  Constitutional: Negative.   HENT: Negative.   Cardiovascular: Negative for palpitations.  Genitourinary: Negative.   Musculoskeletal: Positive for gait problem.       Uses a cane.   Neurological: Negative for numbness.  Psychiatric/Behavioral: Negative.   All other systems reviewed and are negative.      Objective:   Physical Exam  Constitutional: He is oriented to person, place, and time. He appears well-developed and well-nourished.  HENT:  Head: Normocephalic.  Right Ear: External ear normal.  Left Ear: External ear normal.  Eyes: Pupils are equal, round, and reactive to light.  Neck: Normal range of motion. Neck supple.  Cardiovascular: Normal rate, regular rhythm, normal heart sounds and intact distal pulses.   Pulmonary/Chest: Effort normal and breath sounds normal.  Abdominal: Soft. Bowel sounds are normal. There is no tenderness.  Musculoskeletal: Normal range of motion. He exhibits no edema.  Neurological: He is alert and oriented to person, place, and time.  Skin: Skin is warm and dry.  Psychiatric: He has a normal mood and affect. His behavior is normal. Judgment and thought content normal.    BP 132/86 mmHg  Pulse 63  Temp(Src) 96.7 F (35.9 C) (Oral)  Ht 5' 11" (1.803 m)  Wt 217 lb (98.431 kg)  BMI 30.28 kg/m2  Results for orders placed or performed in visit on 09/22/14  POCT glycosylated hemoglobin (Hb A1C)  Result Value Ref Range   Hemoglobin A1C 5.7%          Assessment & Plan:  1.  Hyperlipidemia with target LDL less than 100 Low fat diet - NMR, lipoprofile  2. Hypertensive heart disease with heart failure Do not add salt to diet - CMP14+EGFR  3. Type 2 diabetes mellitus with diabetic  polyneuropathy Continue carb counting - POCT glycosylated hemoglobin (Hb A1C) - metFORMIN (GLUCOPHAGE) 500 MG tablet; Take 1 tablet (500 mg total) by mouth 2 (two) times daily with a meal.  Dispense: 60 tablet; Refill: 3  4. Obesity Discussed diet and exercise for person with BMI >25 Will recheck weight in 3-6 months     Labs pending Health maintenance reviewed Diet and exercise encouraged Continue all meds Follow up  In 3 months   [place], [name]

## 2014-09-23 LAB — CMP14+EGFR
ALBUMIN: 4.3 g/dL (ref 3.5–4.8)
ALK PHOS: 64 IU/L (ref 39–117)
ALT: 16 IU/L (ref 0–44)
AST: 15 IU/L (ref 0–40)
Albumin/Globulin Ratio: 1.7 (ref 1.1–2.5)
BUN/Creatinine Ratio: 15 (ref 10–22)
BUN: 15 mg/dL (ref 8–27)
CO2: 27 mmol/L (ref 18–29)
Calcium: 9.4 mg/dL (ref 8.6–10.2)
Chloride: 102 mmol/L (ref 97–108)
Creatinine, Ser: 1.01 mg/dL (ref 0.76–1.27)
GFR calc non Af Amer: 70 mL/min/{1.73_m2} (ref >59)
GFR, EST AFRICAN AMERICAN: 81 mL/min/{1.73_m2} (ref >59)
Globulin, Total: 2.5 g/dL (ref 1.5–4.5)
Glucose: 98 mg/dL (ref 65–99)
POTASSIUM: 4.3 mmol/L (ref 3.5–5.2)
SODIUM: 143 mmol/L (ref 134–144)
TOTAL PROTEIN: 6.8 g/dL (ref 6.0–8.5)
Total Bilirubin: 1.2 mg/dL (ref 0.0–1.2)

## 2014-09-23 LAB — NMR, LIPOPROFILE
Cholesterol: 136 mg/dL (ref 100–199)
HDL CHOLESTEROL BY NMR: 26 mg/dL — AB (ref >39)
HDL Particle Number: 26.7 umol/L — ABNORMAL LOW (ref >=30.5)
LDL Particle Number: 713 nmol/L (ref <1000)
LDL Size: 19.8 nm (ref >20.5)
LDL-C: 56 mg/dL (ref 0–99)
LP-IR SCORE: 74 — AB (ref <=45)
SMALL LDL PARTICLE NUMBER: 455 nmol/L (ref <=527)
Triglycerides by NMR: 269 mg/dL — ABNORMAL HIGH (ref 0–149)

## 2014-09-23 LAB — MICROALBUMIN, URINE: MICROALBUM., U, RANDOM: 5.5 ug/mL (ref 0.0–17.0)

## 2014-09-25 ENCOUNTER — Telehealth: Payer: Self-pay | Admitting: Nurse Practitioner

## 2014-09-25 NOTE — Telephone Encounter (Signed)
-----   Message from [facility] [place] sent at 09/25/2014 10:57 AM EST ----- microalbumin normal Hgba1c discussed at appointment Kidney and liver function stable Cholesterol looks ok- trig are elevated but were normal last visit- watch carbs in diet - no change at this visit Continue current meds- low fat diet and exercise and recheck in 3 months

## 2014-09-29 NOTE — Telephone Encounter (Signed)
Patient aware.

## 2014-10-09 ENCOUNTER — Other Ambulatory Visit: Payer: Self-pay | Admitting: Nurse Practitioner

## 2014-11-03 ENCOUNTER — Other Ambulatory Visit: Payer: Self-pay | Admitting: Physician Assistant

## 2014-11-06 ENCOUNTER — Other Ambulatory Visit: Payer: Self-pay | Admitting: Nurse Practitioner

## 2014-11-06 MED ORDER — MECLIZINE HCL 12.5 MG PO TABS: 12.5000 mg | ORAL_TABLET | Freq: Three times a day (TID) | ORAL | Status: DC | PRN

## 2014-11-06 NOTE — Telephone Encounter (Signed)
done

## 2014-12-14 ENCOUNTER — Ambulatory Visit (INDEPENDENT_AMBULATORY_CARE_PROVIDER_SITE_OTHER): Payer: Commercial Managed Care - HMO | Admitting: Cardiology

## 2014-12-14 ENCOUNTER — Encounter: Payer: Self-pay | Admitting: Cardiology

## 2014-12-14 VITALS — BP 140/78 | HR 66 | Ht 70.0 in | Wt 213.8 lb

## 2014-12-14 DIAGNOSIS — I2510 Atherosclerotic heart disease of native coronary artery without angina pectoris: Secondary | ICD-10-CM

## 2014-12-14 DIAGNOSIS — E785 Hyperlipidemia, unspecified: Secondary | ICD-10-CM | POA: Diagnosis not present

## 2014-12-14 DIAGNOSIS — R42 Dizziness and giddiness: Secondary | ICD-10-CM | POA: Diagnosis not present
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
## 2014-12-14 NOTE — Patient Instructions (Signed)
Your physician recommends that you continue on your current medications as directed. Please refer to the Current Medication list given to you today. Your physician recommends that you schedule a follow-up appointment in: 1 year with Dr. Harl Bowie. You will receive a reminder letter in the mail in about 10 months reminding you to call and schedule your appointment. If you don't receive this letter, please contact our office.

## 2014-12-14 NOTE — Assessment & Plan Note (Signed)
Coronary disease is stable. His last cath was August, 2015. Medical therapy was recommended. No further workup.

## 2014-12-14 NOTE — Assessment & Plan Note (Signed)
He is not having any recurrent dizziness. Event recorder in 2015 revealed no marked bradycardia.

## 2014-12-14 NOTE — Assessment & Plan Note (Signed)
Previously the patient had been on a statin. His wife decided that there were too many potential side effects from this medicine and she stopped the medicine. He is 81 years of age and I've chosen not to press the issue at this time.

## 2014-12-27 ENCOUNTER — Ambulatory Visit (INDEPENDENT_AMBULATORY_CARE_PROVIDER_SITE_OTHER): Payer: Commercial Managed Care - HMO | Admitting: Nurse Practitioner

## 2014-12-27 ENCOUNTER — Encounter: Payer: Self-pay | Admitting: Nurse Practitioner

## 2014-12-27 VITALS — BP 122/61 | HR 54 | Temp 96.6°F | Ht 70.0 in | Wt 214.0 lb

## 2014-12-27 DIAGNOSIS — Z6831 Body mass index (BMI) 31.0-31.9, adult: Secondary | ICD-10-CM

## 2014-12-27 DIAGNOSIS — E669 Obesity, unspecified: Secondary | ICD-10-CM

## 2014-12-27 DIAGNOSIS — E785 Hyperlipidemia, unspecified: Secondary | ICD-10-CM

## 2014-12-27 DIAGNOSIS — I639 Cerebral infarction, unspecified: Secondary | ICD-10-CM | POA: Diagnosis not present

## 2014-12-27 DIAGNOSIS — I2510 Atherosclerotic heart disease of native coronary artery without angina pectoris: Secondary | ICD-10-CM | POA: Diagnosis not present

## 2014-12-27 DIAGNOSIS — J449 Chronic obstructive pulmonary disease, unspecified: Secondary | ICD-10-CM

## 2014-12-27 DIAGNOSIS — I110 Hypertensive heart disease with heart failure: Secondary | ICD-10-CM

## 2014-12-27 DIAGNOSIS — E1142 Type 2 diabetes mellitus with diabetic polyneuropathy: Secondary | ICD-10-CM

## 2014-12-27 DIAGNOSIS — I509 Heart failure, unspecified: Secondary | ICD-10-CM

## 2014-12-27 DIAGNOSIS — Z23 Encounter for immunization: Secondary | ICD-10-CM

## 2014-12-27 LAB — POCT GLYCOSYLATED HEMOGLOBIN (HGB A1C): Hemoglobin A1C: 5.6

## 2014-12-27 MED ORDER — METFORMIN HCL 500 MG PO TABS: 500.0000 mg | ORAL_TABLET | Freq: Two times a day (BID) | ORAL | Status: DC

## 2014-12-27 MED ORDER — BUDESONIDE-FORMOTEROL FUMARATE 160-4.5 MCG/ACT IN AERO: 2.0000 | INHALATION_SPRAY | Freq: Two times a day (BID) | RESPIRATORY_TRACT | Status: DC

## 2014-12-27 NOTE — Addendum Note (Signed)
Addended by: Rolena Infante on: 12/27/2014 02:51 PM   Modules accepted: Orders

## 2014-12-27 NOTE — Patient Instructions (Signed)

## 2014-12-27 NOTE — Progress Notes (Signed)
Subjective:    Patient ID: [name], male    DOB: [birth year], 81 y.o.   MRN: [id number]  Patient here today for follow up of chronic medical problems. Checking blood glucose regularly ranging in the 100's . BP averaging 130/80's. His only complaint  Today is fatigue that has been going on for awhile.   Diabetes He presents for his follow-up diabetic visit. He has type 2 diabetes mellitus. No MedicAlert identification noted. His disease course has been improving. There are no hypoglycemic associated symptoms. Pertinent negatives for diabetes include no visual change. Diabetic complications include a CVA and retinopathy. Risk factors for coronary artery disease include diabetes mellitus, dyslipidemia, family history, male sex, obesity and sedentary lifestyle. Current diabetic treatment includes oral agent (monotherapy). He is compliant with treatment some of the time. His weight is stable. When asked about meal planning, he reported none. He never participates in exercise. His breakfast blood glucose is taken between 8-9 am. His breakfast blood glucose range is generally 130-140 mg/dl. An ACE inhibitor/angiotensin II receptor blocker is not being taken. He does not see a podiatrist.Eye exam is not current.  Hyperlipidemia This is a chronic problem. The current episode started more than 1 year ago. The problem is uncontrolled. Recent lipid tests were reviewed and are variable. He is currently on no antihyperlipidemic treatment. The current treatment provides moderate improvement of lipids. Compliance problems include adherence to diet and adherence to exercise.  Risk factors for coronary artery disease include diabetes mellitus, dyslipidemia, hypertension, male sex and obesity.  Hypertension This is a chronic problem. The current episode started more than 1 year ago. The problem is unchanged. The problem is uncontrolled. Pertinent negatives include no palpitations. Risk factors for coronary artery  disease include diabetes mellitus, dyslipidemia, male gender, obesity and sedentary lifestyle. Past treatments include nothing. Hypertensive end-organ damage includes CAD/MI, CVA, heart failure and retinopathy.  COPD symbicort helps- has some sob with exertion.  * Hx of CVA, AAA and CAD- sees cardiologist  Review of Systems  Constitutional: Negative.   HENT: Negative.   Cardiovascular: Negative for palpitations.  Genitourinary: Negative.   Musculoskeletal: Positive for gait problem.       Uses a cane.   Neurological: Negative for numbness.  Psychiatric/Behavioral: Negative.   All other systems reviewed and are negative.      Objective:   Physical Exam  Constitutional: He is oriented to person, place, and time. He appears well-developed and well-nourished.  HENT:  Head: Normocephalic.  Right Ear: External ear normal.  Left Ear: External ear normal.  Eyes: Pupils are equal, round, and reactive to light.  Neck: Normal range of motion. Neck supple.  Cardiovascular: Normal rate, regular rhythm, normal heart sounds and intact distal pulses.   Pulmonary/Chest: Effort normal and breath sounds normal.  Abdominal: Soft. Bowel sounds are normal. There is no tenderness.  Musculoskeletal: Normal range of motion. He exhibits no edema.  Neurological: He is alert and oriented to person, place, and time.  Skin: Skin is warm and dry.  Psychiatric: He has a normal mood and affect. His behavior is normal. Judgment and thought content normal.    BP 122/61 mmHg  Pulse 54  Temp(Src) 96.6 F (35.9 C) (Oral)  Ht $R'5\' 10"'OC$  (1.778 m)  Wt 214 lb (97.07 kg)  BMI 30.71 kg/m2  Results for orders placed or performed in visit on 12/27/14  POCT glycosylated hemoglobin (Hb A1C)  Result Value Ref Range   Hemoglobin A1C 5.6  Assessment & Plan:  1. Hyperlipidemia with target LDL less than 100 Low fat diet - NMR, lipoprofile  2. Hypertensive heart disease with heart failure Low fat  diet - CMP14+EGFR  3. Type 2 diabetes mellitus with diabetic polyneuropathy Continue to watch carbs - POCT glycosylated hemoglobin (Hb A1C) - metFORMIN (GLUCOPHAGE) 500 MG tablet; Take 1 tablet (500 mg total) by mouth 2 (two) times daily with a meal.  Dispense: 60 tablet; Refill: 3  4. Coronary artery disease involving native coronary artery of native heart without angina pectoris  5. CVA (cerebral vascular accident)   6. COPD with asthma  - budesonide-formoterol (SYMBICORT) 160-4.5 MCG/ACT inhaler; Inhale 2 puffs into the lungs 2 (two) times daily.  Dispense: 1 Inhaler; Refill: 5  7. Obesity 8. BMI 31.0-31.9,adult Discussed diet and exercise for person with BMI >25 Will recheck weight in 3-6 months    prevnar 13 today Labs pending Health maintenance reviewed Diet and exercise encouraged Continue all meds Follow up  In 3 onths   [place], [name]

## 2014-12-28 LAB — CMP14+EGFR
ALK PHOS: 61 IU/L (ref 39–117)
ALT: 16 IU/L (ref 0–44)
AST: 16 IU/L (ref 0–40)
Albumin/Globulin Ratio: 2.3 (ref 1.1–2.5)
Albumin: 4.3 g/dL (ref 3.5–4.8)
BUN / CREAT RATIO: 16 (ref 10–22)
BUN: 15 mg/dL (ref 8–27)
Bilirubin Total: 1.5 mg/dL — ABNORMAL HIGH (ref 0.0–1.2)
CHLORIDE: 102 mmol/L (ref 97–108)
CO2: 27 mmol/L (ref 18–29)
Calcium: 9.5 mg/dL (ref 8.6–10.2)
Creatinine, Ser: 0.94 mg/dL (ref 0.76–1.27)
GFR calc Af Amer: 89 mL/min/{1.73_m2} (ref >59)
GFR calc non Af Amer: 77 mL/min/{1.73_m2} (ref >59)
GLOBULIN, TOTAL: 1.9 g/dL (ref 1.5–4.5)
Glucose: 111 mg/dL — ABNORMAL HIGH (ref 65–99)
POTASSIUM: 4.6 mmol/L (ref 3.5–5.2)
Sodium: 141 mmol/L (ref 134–144)
Total Protein: 6.2 g/dL (ref 6.0–8.5)

## 2014-12-28 LAB — NMR, LIPOPROFILE
Cholesterol: 129 mg/dL (ref 100–199)
HDL Cholesterol by NMR: 27 mg/dL — ABNORMAL LOW (ref >39)
HDL Particle Number: 26.1 umol/L — ABNORMAL LOW (ref >=30.5)
LDL PARTICLE NUMBER: 712 nmol/L (ref <1000)
LDL SIZE: 20 nm (ref >20.5)
LDL-C: 51 mg/dL (ref 0–99)
LP-IR Score: 80 — ABNORMAL HIGH (ref <=45)
SMALL LDL PARTICLE NUMBER: 385 nmol/L (ref <=527)
Triglycerides by NMR: 253 mg/dL — ABNORMAL HIGH (ref 0–149)

## 2015-01-29 DIAGNOSIS — H52223 Regular astigmatism, bilateral: Secondary | ICD-10-CM | POA: Diagnosis not present

## 2015-01-29 DIAGNOSIS — H2513 Age-related nuclear cataract, bilateral: Secondary | ICD-10-CM | POA: Diagnosis not present

## 2015-01-29 DIAGNOSIS — H2510 Age-related nuclear cataract, unspecified eye: Secondary | ICD-10-CM | POA: Diagnosis not present

## 2015-01-29 DIAGNOSIS — H5200 Hypermetropia, unspecified eye: Secondary | ICD-10-CM | POA: Diagnosis not present

## 2015-01-29 DIAGNOSIS — E109 Type 1 diabetes mellitus without complications: Secondary | ICD-10-CM | POA: Diagnosis not present

## 2015-01-29 DIAGNOSIS — H5210 Myopia, unspecified eye: Secondary | ICD-10-CM | POA: Diagnosis not present

## 2015-01-29 DIAGNOSIS — H524 Presbyopia: Secondary | ICD-10-CM | POA: Diagnosis not present

## 2015-01-29 DIAGNOSIS — I10 Essential (primary) hypertension: Secondary | ICD-10-CM | POA: Diagnosis not present

## 2015-01-29 LAB — HM DIABETES EYE EXAM

## 2015-01-30 ENCOUNTER — Encounter: Payer: Self-pay | Admitting: *Deleted

## 2015-02-02 ENCOUNTER — Encounter: Payer: Self-pay | Admitting: *Deleted

## 2015-02-05 ENCOUNTER — Telehealth: Payer: Self-pay

## 2015-02-05 NOTE — Telephone Encounter (Signed)
Patient called in that he needed a referral to Dr Denna Haggard for [facility]   Referral entered in [employer] authorization # [id number] 02/05/15 to 08/04/15  DAB

## 2015-02-18 ENCOUNTER — Other Ambulatory Visit: Payer: Self-pay | Admitting: Nurse Practitioner

## 2015-02-19 ENCOUNTER — Other Ambulatory Visit: Payer: Self-pay | Admitting: Nurse Practitioner

## 2015-03-02 ENCOUNTER — Encounter: Payer: Self-pay | Admitting: *Deleted

## 2015-03-12 ENCOUNTER — Other Ambulatory Visit: Payer: Self-pay | Admitting: Dermatology

## 2015-03-12 DIAGNOSIS — D239 Other benign neoplasm of skin, unspecified: Secondary | ICD-10-CM | POA: Diagnosis not present

## 2015-03-12 DIAGNOSIS — C4491 Basal cell carcinoma of skin, unspecified: Secondary | ICD-10-CM

## 2015-03-12 DIAGNOSIS — C44319 Basal cell carcinoma of skin of other parts of face: Secondary | ICD-10-CM | POA: Diagnosis not present

## 2015-03-12 DIAGNOSIS — C44310 Basal cell carcinoma of skin of unspecified parts of face: Secondary | ICD-10-CM | POA: Diagnosis not present

## 2015-03-12 DIAGNOSIS — L821 Other seborrheic keratosis: Secondary | ICD-10-CM | POA: Diagnosis not present

## 2015-03-12 HISTORY — DX: Basal cell carcinoma of skin, unspecified: C44.91

## 2015-03-18 ENCOUNTER — Other Ambulatory Visit: Payer: Self-pay | Admitting: Family Medicine

## 2015-04-03 ENCOUNTER — Ambulatory Visit (INDEPENDENT_AMBULATORY_CARE_PROVIDER_SITE_OTHER): Payer: Commercial Managed Care - HMO | Admitting: Nurse Practitioner

## 2015-04-03 ENCOUNTER — Encounter: Payer: Self-pay | Admitting: Nurse Practitioner

## 2015-04-03 VITALS — BP 138/82 | HR 66 | Temp 96.9°F | Ht 70.0 in | Wt 214.0 lb

## 2015-04-03 DIAGNOSIS — I110 Hypertensive heart disease with heart failure: Secondary | ICD-10-CM | POA: Diagnosis not present

## 2015-04-03 DIAGNOSIS — I509 Heart failure, unspecified: Secondary | ICD-10-CM | POA: Diagnosis not present

## 2015-04-03 DIAGNOSIS — I2510 Atherosclerotic heart disease of native coronary artery without angina pectoris: Secondary | ICD-10-CM

## 2015-04-03 DIAGNOSIS — E785 Hyperlipidemia, unspecified: Secondary | ICD-10-CM

## 2015-04-03 DIAGNOSIS — J449 Chronic obstructive pulmonary disease, unspecified: Secondary | ICD-10-CM | POA: Diagnosis not present

## 2015-04-03 DIAGNOSIS — R159 Full incontinence of feces: Secondary | ICD-10-CM | POA: Diagnosis not present

## 2015-04-03 DIAGNOSIS — E1142 Type 2 diabetes mellitus with diabetic polyneuropathy: Secondary | ICD-10-CM

## 2015-04-03 DIAGNOSIS — N3946 Mixed incontinence: Secondary | ICD-10-CM

## 2015-04-03 DIAGNOSIS — Z6831 Body mass index (BMI) 31.0-31.9, adult: Secondary | ICD-10-CM

## 2015-04-03 DIAGNOSIS — E669 Obesity, unspecified: Secondary | ICD-10-CM | POA: Diagnosis not present

## 2015-04-03 DIAGNOSIS — I639 Cerebral infarction, unspecified: Secondary | ICD-10-CM | POA: Diagnosis not present

## 2015-04-03 DIAGNOSIS — J4489 Other specified chronic obstructive pulmonary disease: Secondary | ICD-10-CM

## 2015-04-03 LAB — POCT GLYCOSYLATED HEMOGLOBIN (HGB A1C): HEMOGLOBIN A1C: 5.6

## 2015-04-03 MED ORDER — MECLIZINE HCL 12.5 MG PO TABS: 12.5000 mg | ORAL_TABLET | Freq: Three times a day (TID) | ORAL | Status: DC | PRN

## 2015-04-03 MED ORDER — METFORMIN HCL 500 MG PO TABS: ORAL_TABLET | ORAL | Status: DC

## 2015-04-03 MED ORDER — BUDESONIDE-FORMOTEROL FUMARATE 160-4.5 MCG/ACT IN AERO: 2.0000 | INHALATION_SPRAY | Freq: Two times a day (BID) | RESPIRATORY_TRACT | Status: DC

## 2015-04-03 NOTE — Progress Notes (Signed)
Subjective:    Patient ID: [name], male    DOB: Mar 15, 1933, 81 y.o.   MRN: [id number]  Patient here today for follow up of chronic medical problems. Checking blood glucose regularly ranging in the 120's . BP averaging 130/80's. His only complaint.His daughter is here with him. Worried about bruising on bil arms- wants him to have compression hose for arms. SHe also says that he has incontinence and would like referral to Dr. Gareth Eagle. He also has occasional stool incontinence.   Diabetes He presents for his follow-up diabetic visit. He has type 2 diabetes mellitus. No MedicAlert identification noted. His disease course has been improving. There are no hypoglycemic associated symptoms. Pertinent negatives for diabetes include no visual change. Diabetic complications include a CVA and retinopathy. Risk factors for coronary artery disease include diabetes mellitus, dyslipidemia, family history, male sex, obesity and sedentary lifestyle. Current diabetic treatment includes oral agent (monotherapy). He is compliant with treatment some of the time. His weight is stable. When asked about meal planning, he reported none. He never participates in exercise. His breakfast blood glucose is taken between 8-9 am. His breakfast blood glucose range is generally 130-140 mg/dl. An ACE inhibitor/angiotensin II receptor blocker is not being taken. He does not see a podiatrist.Eye exam is not current.  Hyperlipidemia This is a chronic problem. The current episode started more than 1 year ago. The problem is uncontrolled. Recent lipid tests were reviewed and are variable. He is currently on no antihyperlipidemic treatment. The current treatment provides moderate improvement of lipids. Compliance problems include adherence to diet and adherence to exercise.  Risk factors for coronary artery disease include diabetes mellitus, dyslipidemia, hypertension, male sex and obesity.  Hypertension This is a chronic problem. The  current episode started more than 1 year ago. The problem is unchanged. The problem is uncontrolled. Pertinent negatives include no palpitations. Risk factors for coronary artery disease include diabetes mellitus, dyslipidemia, male gender, obesity and sedentary lifestyle. Past treatments include nothing. Hypertensive end-organ damage includes CAD/MI, CVA, heart failure and retinopathy.  COPD symbicort helps- has some sob with exertion.  * Hx of CVA, AAA and CAD- sees cardiologist  Review of Systems  Constitutional: Negative.   HENT: Negative.   Cardiovascular: Negative for palpitations.  Genitourinary: Negative.   Musculoskeletal: Positive for gait problem.       Uses a cane.   Neurological: Negative for numbness.  Psychiatric/Behavioral: Negative.   All other systems reviewed and are negative.      Objective:   Physical Exam  Constitutional: He is oriented to person, place, and time. He appears well-developed and well-nourished.  HENT:  Head: Normocephalic.  Right Ear: External ear normal.  Left Ear: External ear normal.  Eyes: Pupils are equal, round, and reactive to light.  Neck: Normal range of motion. Neck supple.  Cardiovascular: Normal rate, regular rhythm, normal heart sounds and intact distal pulses.   Pulmonary/Chest: Effort normal and breath sounds normal.  Abdominal: Soft. Bowel sounds are normal. There is no tenderness.  Musculoskeletal: Normal range of motion. He exhibits no edema.  Neurological: He is alert and oriented to person, place, and time.  Skin: Skin is warm and dry.  Psychiatric: He has a normal mood and affect. His behavior is normal. Judgment and thought content normal.   BP 138/82 mmHg  Pulse 66  Temp(Src) 96.9 F (36.1 C) (Oral)  Ht _0  (1.778 m)  Wt 214 lb (97.07 kg)  BMI 30.71 kg/m2    Results for  orders placed or performed in visit on 01/30/15  HM DIABETES EYE EXAM  Result Value Ref Range   HM Diabetic Eye Exam No Retinopathy No  Retinopathy    Results for orders placed or performed in visit on 04/03/15  POCT glycosylated hemoglobin (Hb A1C)  Result Value Ref Range   Hemoglobin A1C 5.6          Assessment & Plan:  1. Hypertensive heart disease with heart failure Do not add salt to deit - CMP14+EGFR  2. Coronary artery disease involving native coronary artery of native heart without angina pectoris Keep follow up ith cardiologist  3. CVA (cerebral vascular accident)  4. COPD with asthma Avoid allergens - budesonide-formoterol (SYMBICORT) 160-4.5 MCG/ACT inhaler; Inhale 2 puffs into the lungs 2 (two) times daily.  Dispense: 1 Inhaler; Refill: 5  5. Type 2 diabetes mellitus with diabetic polyneuropathy Continue carb counting - POCT glycosylated hemoglobin (Hb A1C) - metFORMIN (GLUCOPHAGE) 500 MG tablet; TAKE 1 TABLET (500 MG TOTAL) BY MOUTH 2 (TWO) TIMES DAILY WITH A MEAL.  Dispense: 60 tablet; Refill: 5  6. Obesity Discussed diet and exercise for person with BMI >25 Will recheck weight in 3-6 months   7. Hyperlipidemia with target LDL less than 100 Low fat diet - Lipid panel  8. BMI 31.0-31.9,adult Discussed diet and exercise for person with BMI >25 Will recheck weight in 3-6 months   9. Mixed incontinence - Ambulatory referral to Urology  10. Stool incontinence Metamucil may help harden stool   Labs pending Health maintenance reviewed Diet and exercise encouraged Continue all meds Follow up  In 3 month   [place], [name]

## 2015-04-03 NOTE — Patient Instructions (Signed)

## 2015-04-04 ENCOUNTER — Ambulatory Visit: Payer: Commercial Managed Care - HMO | Admitting: Nurse Practitioner

## 2015-04-04 LAB — CMP14+EGFR
A/G RATIO: 1.8 (ref 1.1–2.5)
ALBUMIN: 4.1 g/dL (ref 3.5–4.7)
ALK PHOS: 69 IU/L (ref 39–117)
ALT: 15 IU/L (ref 0–44)
AST: 15 IU/L (ref 0–40)
BILIRUBIN TOTAL: 1.2 mg/dL (ref 0.0–1.2)
BUN/Creatinine Ratio: 15 (ref 10–22)
BUN: 13 mg/dL (ref 8–27)
CO2: 27 mmol/L (ref 18–29)
Calcium: 9.2 mg/dL (ref 8.6–10.2)
Chloride: 103 mmol/L (ref 97–108)
Creatinine, Ser: 0.88 mg/dL (ref 0.76–1.27)
GFR calc Af Amer: 94 mL/min/{1.73_m2} (ref >59)
GFR, EST NON AFRICAN AMERICAN: 81 mL/min/{1.73_m2} (ref >59)
GLOBULIN, TOTAL: 2.3 g/dL (ref 1.5–4.5)
Glucose: 124 mg/dL — ABNORMAL HIGH (ref 65–99)
Potassium: 4 mmol/L (ref 3.5–5.2)
SODIUM: 143 mmol/L (ref 134–144)
Total Protein: 6.4 g/dL (ref 6.0–8.5)

## 2015-04-04 LAB — LIPID PANEL
Chol/HDL Ratio: 5.5 ratio units — ABNORMAL HIGH (ref 0.0–5.0)
Cholesterol, Total: 138 mg/dL (ref 100–199)
HDL: 25 mg/dL — ABNORMAL LOW (ref >39)
LDL Calculated: 48 mg/dL (ref 0–99)
Triglycerides: 324 mg/dL — ABNORMAL HIGH (ref 0–149)
VLDL Cholesterol Cal: 65 mg/dL — ABNORMAL HIGH (ref 5–40)

## 2015-04-05 ENCOUNTER — Telehealth: Payer: Self-pay | Admitting: *Deleted

## 2015-04-05 NOTE — Telephone Encounter (Signed)
-----   Message from [name], [place] sent at 04/05/2015  9:58 AM EDT ----- Kidney and liver function stable ldl are great- trig are elevated- needs to be on fenofibrate- rx sent to pharmacy Continue current meds- low fat diet and exercise and recheck in 3 months

## 2015-04-06 ENCOUNTER — Telehealth: Payer: Self-pay | Admitting: Nurse Practitioner

## 2015-04-06 NOTE — Telephone Encounter (Signed)
Patient aware of results.

## 2015-04-12 ENCOUNTER — Telehealth: Payer: Self-pay | Admitting: Nurse Practitioner

## 2015-04-12 MED ORDER — FENOFIBRATE 160 MG PO TABS: 160.0000 mg | ORAL_TABLET | Freq: Every day | ORAL | Status: DC

## 2015-04-12 NOTE — Telephone Encounter (Signed)
Fenofibrate sent to pharmacy

## 2015-04-16 DIAGNOSIS — C44319 Basal cell carcinoma of skin of other parts of face: Secondary | ICD-10-CM | POA: Diagnosis not present

## 2015-05-14 ENCOUNTER — Ambulatory Visit (INDEPENDENT_AMBULATORY_CARE_PROVIDER_SITE_OTHER): Payer: Commercial Managed Care - HMO

## 2015-05-14 ENCOUNTER — Encounter: Payer: Self-pay | Admitting: Cardiology

## 2015-05-14 ENCOUNTER — Other Ambulatory Visit (INDEPENDENT_AMBULATORY_CARE_PROVIDER_SITE_OTHER): Payer: Commercial Managed Care - HMO

## 2015-05-14 ENCOUNTER — Ambulatory Visit (INDEPENDENT_AMBULATORY_CARE_PROVIDER_SITE_OTHER): Payer: Commercial Managed Care - HMO | Admitting: Cardiology

## 2015-05-14 ENCOUNTER — Other Ambulatory Visit: Payer: Commercial Managed Care - HMO

## 2015-05-14 VITALS — BP 122/60 | HR 63 | Ht 71.0 in | Wt 210.0 lb

## 2015-05-14 DIAGNOSIS — R0602 Shortness of breath: Secondary | ICD-10-CM

## 2015-05-14 DIAGNOSIS — I25810 Atherosclerosis of coronary artery bypass graft(s) without angina pectoris: Secondary | ICD-10-CM

## 2015-05-14 DIAGNOSIS — R5383 Other fatigue: Secondary | ICD-10-CM

## 2015-05-14 DIAGNOSIS — R05 Cough: Secondary | ICD-10-CM

## 2015-05-14 DIAGNOSIS — R059 Cough, unspecified: Secondary | ICD-10-CM | POA: Insufficient documentation

## 2015-05-14 DIAGNOSIS — R0789 Other chest pain: Secondary | ICD-10-CM | POA: Diagnosis not present

## 2015-05-14 DIAGNOSIS — R42 Dizziness and giddiness: Secondary | ICD-10-CM
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
## 2015-05-14 NOTE — Assessment & Plan Note (Signed)
He is complaining of an intermittent cough at this time. He says that there is some sputum production that is thick but clear. He has not had a fever. He is having some mild shortness of breath. He does not appear to be acutely ill. I will arrange for him to have a chest x-ray. In addition he will have hematology and chemistry labs checked along with his thyroid functions. I will await the results of these labs. In addition he is scheduled to be seen by his primary care team on September 30. They will have access to all the information inside of this electronic medical record.

## 2015-05-14 NOTE — Progress Notes (Signed)
Labs for Dr Carlena Bjornstad CBC, TSH, CMP R53.83 R06.02

## 2015-05-14 NOTE — Assessment & Plan Note (Signed)
The patient's last catheterization was done in August, 2015. Medical therapy was recommended. I do not feel that he is having any significant ischemic symptoms at this time. No further workup.

## 2015-05-14 NOTE — Patient Instructions (Signed)
Continue all current medications. Labs for CBC, CMET, TSH Chest x-ray Orders given for above today. Office will contact with results via phone or letter.   Keep your already scheduled appointment with primary MD for 05/25/2015.

## 2015-05-14 NOTE — Assessment & Plan Note (Signed)
He is not having any significant recurrent dizziness. This had been evaluated fully in 2015.

## 2015-05-15 LAB — CBC WITH DIFFERENTIAL/PLATELET
BASOS: 0 %
Basophils Absolute: 0 10*3/uL (ref 0.0–0.2)
EOS (ABSOLUTE): 0.2 10*3/uL (ref 0.0–0.4)
EOS: 3 %
HEMATOCRIT: 42.2 % (ref 37.5–51.0)
Hemoglobin: 14 g/dL (ref 12.6–17.7)
IMMATURE GRANS (ABS): 0 10*3/uL (ref 0.0–0.1)
IMMATURE GRANULOCYTES: 0 %
Lymphocytes Absolute: 1 10*3/uL (ref 0.7–3.1)
Lymphs: 20 %
MCH: 30.8 pg (ref 26.6–33.0)
MCHC: 33.2 g/dL (ref 31.5–35.7)
MCV: 93 fL (ref 79–97)
MONOS ABS: 0.4 10*3/uL (ref 0.1–0.9)
Monocytes: 8 %
NEUTROS ABS: 3.6 10*3/uL (ref 1.4–7.0)
Neutrophils: 69 %
PLATELETS: 114 10*3/uL — AB (ref 150–379)
RBC: 4.54 x10E6/uL (ref 4.14–5.80)
RDW: 15 % (ref 12.3–15.4)
WBC: 5.3 10*3/uL (ref 3.4–10.8)

## 2015-05-15 LAB — TSH: TSH: 2.03 u[IU]/mL (ref 0.450–4.500)

## 2015-05-15 LAB — CMP14+EGFR
A/G RATIO: 1.8 (ref 1.1–2.5)
ALT: 14 IU/L (ref 0–44)
AST: 15 IU/L (ref 0–40)
Albumin: 4.1 g/dL (ref 3.5–4.7)
Alkaline Phosphatase: 45 IU/L (ref 39–117)
BILIRUBIN TOTAL: 0.7 mg/dL (ref 0.0–1.2)
BUN/Creatinine Ratio: 14 (ref 10–22)
BUN: 14 mg/dL (ref 8–27)
CALCIUM: 10.3 mg/dL — AB (ref 8.6–10.2)
CHLORIDE: 102 mmol/L (ref 97–108)
CO2: 27 mmol/L (ref 18–29)
Creatinine, Ser: 1 mg/dL (ref 0.76–1.27)
GFR, EST AFRICAN AMERICAN: 82 mL/min/{1.73_m2} (ref >59)
GFR, EST NON AFRICAN AMERICAN: 71 mL/min/{1.73_m2} (ref >59)
GLOBULIN, TOTAL: 2.3 g/dL (ref 1.5–4.5)
Glucose: 127 mg/dL — ABNORMAL HIGH (ref 65–99)
POTASSIUM: 4 mmol/L (ref 3.5–5.2)
SODIUM: 143 mmol/L (ref 134–144)
TOTAL PROTEIN: 6.4 g/dL (ref 6.0–8.5)

## 2015-05-17 ENCOUNTER — Telehealth: Payer: Self-pay | Admitting: *Deleted

## 2015-05-17 NOTE — Telephone Encounter (Signed)
-----   Message from Carlena Bjornstad, MD sent at 05/16/2015  7:42 AM EDT ----- Please notify the patient that his labs are good. His chest x-ray is also good. Encourage him to keep his appointment with [facility] that is already scheduled

## 2015-05-17 NOTE — Telephone Encounter (Signed)
Patient's wife informed

## 2015-05-25 ENCOUNTER — Ambulatory Visit (INDEPENDENT_AMBULATORY_CARE_PROVIDER_SITE_OTHER): Payer: Commercial Managed Care - HMO | Admitting: Family Medicine

## 2015-05-25 ENCOUNTER — Encounter (INDEPENDENT_AMBULATORY_CARE_PROVIDER_SITE_OTHER): Payer: Self-pay

## 2015-05-25 ENCOUNTER — Encounter: Payer: Self-pay | Admitting: Family Medicine

## 2015-05-25 VITALS — BP 135/64 | HR 54 | Temp 97.1°F | Ht 71.0 in | Wt 211.8 lb

## 2015-05-25 DIAGNOSIS — Z0000 Encounter for general adult medical examination without abnormal findings: Secondary | ICD-10-CM

## 2015-05-25 DIAGNOSIS — Z23 Encounter for immunization: Secondary | ICD-10-CM | POA: Diagnosis not present

## 2015-05-25 NOTE — Progress Notes (Signed)
Subjective:    [name] is a 81 y.o. male who presents for Medicare Annual/Subsequent preventive examination.   Preventive Screening-Counseling & Management  Tobacco History  Smoking status  . Former Smoker -- 3.00 packs/day for 50 years  . Types: Cigarettes  . Quit date: 08/25/1998  Smokeless tobacco  . Former Systems developer  . Types: Chew    Comment: quit smoking cigarettes & chewing in  "2000"    Problems Prior to Visit 1. See Below  Current Problems (verified) Patient Active Problem List   Diagnosis Date Noted  . Cough 05/14/2015  . Mixed incontinence 04/03/2015  . Stool incontinence 04/03/2015  . PVC's (premature ventricular contractions) 06/05/2014  . BMI 31.0-31.9,adult 05/17/2014  . Diastolic dysfunction- grade 1 by echo June 2015, EF 50% 03/28/2014  . Dizziness 03/28/2014  . Obesity 03/28/2014  . Vestibular dizziness 03/28/2014  . Dyspnea on exertion 03/28/2014  . COPD with asthma 02/21/2014  . Dyspnea- multifactorial 12/07/2013  . OSA (obstructive sleep apnea) 12/07/2013  . Sinus bradycardia- ? symptomatic 05/17/2013  . Chest pain with moderate risk of acute coronary syndrome 04/21/2012  . CAD (coronary artery disease)   . Thrombocytopenia   . S/P CABG x 3 08/21/2011  . Ejection fraction   . S/P AAA repair   . Fatigue   . Hypertensive cardiovascular disease   . CVA (cerebral vascular accident)   . Prostate cancer   . Hyperlipidemia with target LDL less than 100   . Headache(784.0)   . Diabetes 11/28/2010    Medications Prior to Visit Current Outpatient Prescriptions on File Prior to Visit  Medication Sig Dispense Refill  . aspirin 81 MG tablet Take 81 mg by mouth daily.    . budesonide-formoterol (SYMBICORT) 160-4.5 MCG/ACT inhaler Inhale 2 puffs into the lungs 2 (two) times daily. 1 Inhaler 5  . fenofibrate 160 MG tablet Take 1 tablet (160 mg total) by mouth daily. 30 tablet 5  . glucose blood (BAYER CONTOUR TEST) test strip Test 1X per day and prn     Dx E11.49 100 each 5  . meclizine (ANTIVERT) 12.5 MG tablet Take 1 tablet (12.5 mg total) by mouth 3 (three) times daily as needed for dizziness. 30 tablet 1  . metFORMIN (GLUCOPHAGE) 500 MG tablet TAKE 1 TABLET (500 MG TOTAL) BY MOUTH 2 (TWO) TIMES DAILY WITH A MEAL. 60 tablet 5  . naproxen sodium (ANAPROX) 220 MG tablet Take 220 mg by mouth at bedtime as needed.      No current facility-administered medications on file prior to visit.    Current Medications (verified) Current Outpatient Prescriptions  Medication Sig Dispense Refill  . aspirin 81 MG tablet Take 81 mg by mouth daily.    . budesonide-formoterol (SYMBICORT) 160-4.5 MCG/ACT inhaler Inhale 2 puffs into the lungs 2 (two) times daily. 1 Inhaler 5  . fenofibrate 160 MG tablet Take 1 tablet (160 mg total) by mouth daily. 30 tablet 5  . glucose blood (BAYER CONTOUR TEST) test strip Test 1X per day and prn    Dx E11.49 100 each 5  . meclizine (ANTIVERT) 12.5 MG tablet Take 1 tablet (12.5 mg total) by mouth 3 (three) times daily as needed for dizziness. 30 tablet 1  . metFORMIN (GLUCOPHAGE) 500 MG tablet TAKE 1 TABLET (500 MG TOTAL) BY MOUTH 2 (TWO) TIMES DAILY WITH A MEAL. 60 tablet 5  . naproxen sodium (ANAPROX) 220 MG tablet Take 220 mg by mouth at bedtime as needed.  No current facility-administered medications for this visit.     Allergies (verified) Erythromycin; Penicillins; and Ketorolac tromethamine   PAST HISTORY  Family History Family History  Problem Relation Age of Onset  . Heart attack Mother   . Heart attack Father   . Heart attack Brother   . Prostate cancer Brother   . Colon cancer Brother     Social History Social History  Substance Use Topics  . Smoking status: Former Smoker -- 3.00 packs/day for 50 years    Types: Cigarettes    Quit date: 08/25/1998  . Smokeless tobacco: Former Systems developer    Types: Chew     Comment: quit smoking cigarettes & chewing in  "2000"  . Alcohol Use: No     Comment:  03/29/2014 "last alcohol was too long ago to count"    Are there smokers in your home (other than you)?  No  Risk Factors Current exercise habits: walking and active putside  Dietary issues discussed: None   Cardiac risk factors: advanced age (older than 65 for men, 26 for women), dyslipidemia, hypertension, male gender, obesity (BMI >= 30 kg/m2) and smoking/ tobacco exposure.  Depression Screen (Note: if answer to either of the following is "Yes", a more complete depression screening is indicated)   Q1: Over the past two weeks, have you felt down, depressed or hopeless? No  Q2: Over the past two weeks, have you felt little interest or pleasure in doing things? No  Have you lost interest or pleasure in daily life? No  Do you often feel hopeless? No  Do you cry easily over simple problems? No  Activities of Daily Living In your present state of health, do you have any difficulty performing the following activities?:  Driving? Yes Managing money?  Wife manages Feeding yourself? No Getting from bed to chair? No Climbing a flight of stairs? Yes, he does well but is careful Preparing food and eating?: No Bathing or showering? No Getting dressed: No Getting to the toilet? No Using the toilet:No Moving around from place to place: No In the past year have you fallen or had a near fall?:No   Are you sexually active?  No   Do you have more than one partner?  No  Hearing Difficulties: No Do you often ask people to speak up or repeat themselves? Yes Do you experience ringing or noises in your ears? No Do you have difficulty understanding soft or whispered voices? Yes   Do you feel that you have a problem with memory? No  Do you often misplace items? No  Do you feel safe at home?  Yes  Cognitive Testing  Alert? Yes  Normal Appearance?Yes  Oriented to person? Yes  Place? Yes   Time? Yes  Recall of three objects?  No 2/3  Can perform simple calculations? Yes  Displays appropriate  judgment?Yes  Can read the correct time from a watch face?Yes   Advanced Directives have been discussed with the patient? Yes   List the Names of Other Physician/Practitioners you currently use: 1.  [name]- PCP 2. Dr. Ron Parker- Cardiology 3. Dr. Halford Chessman - pulmonology  Indicate any recent Medical Services you may have received from other than [facility] providers in the past year (date may be approximate).  Immunization History  Administered Date(s) Administered  . Influenza,inj,Quad PF,36+ Mos 07/08/2013, 09/22/2014  . Pneumococcal Conjugate-13 12/27/2014  . Zoster 07/08/2013    Screening Tests Health Maintenance  Topic Date Due  . INFLUENZA VACCINE  03/26/2015  . TETANUS/TDAP  10/04/2015 (Originally 03/25/2013)  . URINE MICROALBUMIN  09/23/2015  . HEMOGLOBIN A1C  10/04/2015  . PNA vac Low Risk Adult (2 of 2 - PPSV23) 12/27/2015  . OPHTHALMOLOGY EXAM  01/29/2016  . FOOT EXAM  04/02/2016  . ZOSTAVAX  Completed    All answers were reviewed with the patient and necessary referrals were made:  Kenn File, MD   05/25/2015   History reviewed: allergies, current medications, past family history, past medical history, past social history, past surgical history and problem list  Review of Systems Pertinent items are noted in HPI.    Objective:      Blood pressure 135/64, pulse 54, temperature 97.1 F (36.2 C), temperature source Oral, height 5\' 11"  (1.803 m), weight 211 lb 12.8 oz (96.072 kg). Body mass index is 29.55 kg/(m^2).  Gen: NAD, alert, cooperative with exam HEENT: NCAT, TMs normal bilaterally, nares clear CV: RRR, good S1/S2, no murmur Resp: CTABL, no wheezes, non-labored Ext: No edema, warm Neuro: Alert and oriented, No gross deficits       Assessment:     Mr. Cass is a pleasant 81 y/o male here for his medicare annual wellness visit. He is doing well. He has a hard time hearing but is not interested in hearing aids.       Plan:     During  the course of the visit the patient was educated and counseled about appropriate screening and preventive services including:    Influenza vaccine   Counseling for advanced directives  Diet review for nutrition referral? no   Patient Instructions (the written plan) was given to the patient.  Medicare Attestation I have personally reviewed: The patient's medical and social history Their use of alcohol, tobacco or illicit drugs Their current medications and supplements The patient's functional ability including ADLs,fall risks, home safety risks, cognitive, and hearing and visual impairment Diet and physical activities Evidence for depression or mood disorders  The patient's weight, height, BMI, and visual acuity have been recorded in the chart.  I have made referrals, counseling, and provided education to the patient based on review of the above and I have provided the patient with a written personalized care plan for preventive services.     Kenn File, MD   05/25/2015

## 2015-05-25 NOTE — Patient Instructions (Addendum)
Great to meet you guys!  Be sure to see [name] in November like you have planned.

## 2015-07-09 ENCOUNTER — Ambulatory Visit: Payer: Commercial Managed Care - HMO | Admitting: Nurse Practitioner

## 2015-08-06 ENCOUNTER — Encounter: Payer: Self-pay | Admitting: Nurse Practitioner

## 2015-08-06 ENCOUNTER — Ambulatory Visit (INDEPENDENT_AMBULATORY_CARE_PROVIDER_SITE_OTHER): Payer: Commercial Managed Care - HMO | Admitting: Nurse Practitioner

## 2015-08-06 DIAGNOSIS — E1142 Type 2 diabetes mellitus with diabetic polyneuropathy: Secondary | ICD-10-CM

## 2015-08-06 DIAGNOSIS — I25810 Atherosclerosis of coronary artery bypass graft(s) without angina pectoris: Secondary | ICD-10-CM | POA: Diagnosis not present

## 2015-08-06 DIAGNOSIS — J45909 Unspecified asthma, uncomplicated: Secondary | ICD-10-CM | POA: Diagnosis not present

## 2015-08-06 DIAGNOSIS — J449 Chronic obstructive pulmonary disease, unspecified: Secondary | ICD-10-CM

## 2015-08-06 DIAGNOSIS — Z6831 Body mass index (BMI) 31.0-31.9, adult: Secondary | ICD-10-CM | POA: Diagnosis not present

## 2015-08-06 DIAGNOSIS — I110 Hypertensive heart disease with heart failure: Secondary | ICD-10-CM | POA: Diagnosis not present

## 2015-08-06 DIAGNOSIS — E785 Hyperlipidemia, unspecified: Secondary | ICD-10-CM | POA: Diagnosis not present

## 2015-08-06 LAB — POCT GLYCOSYLATED HEMOGLOBIN (HGB A1C): Hemoglobin A1C: 5.8

## 2015-08-06 MED ORDER — METFORMIN HCL 500 MG PO TABS: ORAL_TABLET | ORAL | Status: DC

## 2015-08-06 MED ORDER — GLUCOSE BLOOD VI STRP: ORAL_STRIP | Status: DC

## 2015-08-06 MED ORDER — FENOFIBRATE 160 MG PO TABS: 160.0000 mg | ORAL_TABLET | Freq: Every day | ORAL | Status: DC

## 2015-08-06 NOTE — Progress Notes (Signed)
Subjective:    Patient ID: [name], male    DOB: 1933/01/07, 81 y.o.   MRN: [id number]  Patient here today for follow up of chronic medical problems. Checking blood glucose regularly ranging in the 120's . BP averaging 130/80's. His only complaint.His daughter is here with him. Worried about bruising on bil arms- wants him to have compression hose for arms. SHe also says that he has incontinence and would like referral to Dr. Gareth Eagle. He also has occasional stool incontinence.   Diabetes He presents for his follow-up diabetic visit. He has type 2 diabetes mellitus. No MedicAlert identification noted. His disease course has been improving. There are no hypoglycemic associated symptoms. Pertinent negatives for diabetes include no visual change. Diabetic complications include a CVA and retinopathy. Risk factors for coronary artery disease include diabetes mellitus, dyslipidemia, family history, male sex, obesity and sedentary lifestyle. Current diabetic treatment includes oral agent (monotherapy). He is compliant with treatment some of the time. His weight is stable. When asked about meal planning, he reported none. He never participates in exercise. His breakfast blood glucose is taken between 8-9 am. His breakfast blood glucose range is generally 130-140 mg/dl. An ACE inhibitor/angiotensin II receptor blocker is not being taken. He does not see a podiatrist.Eye exam is not current.  Hyperlipidemia This is a chronic problem. The current episode started more than 1 year ago. The problem is uncontrolled. Recent lipid tests were reviewed and are variable. He is currently on no antihyperlipidemic treatment. The current treatment provides moderate improvement of lipids. Compliance problems include adherence to diet and adherence to exercise.  Risk factors for coronary artery disease include diabetes mellitus, dyslipidemia, hypertension, male sex and obesity.  Hypertension This is a chronic problem. The  current episode started more than 1 year ago. The problem is unchanged. The problem is uncontrolled. Pertinent negatives include no palpitations. Risk factors for coronary artery disease include diabetes mellitus, dyslipidemia, male gender, obesity and sedentary lifestyle. Past treatments include nothing. Hypertensive end-organ damage includes CAD/MI, CVA, heart failure and retinopathy.  COPD symbicort helps- has some sob with exertion.  * Hx of CVA, AAA and CAD- sees cardiologist  Review of Systems  Constitutional: Negative.   HENT: Negative.   Cardiovascular: Negative for palpitations.  Genitourinary: Negative.   Musculoskeletal: Positive for gait problem.       Uses a cane.   Neurological: Negative for numbness.  Psychiatric/Behavioral: Negative.   All other systems reviewed and are negative.      Objective:   Physical Exam  Constitutional: He is oriented to person, place, and time. He appears well-developed and well-nourished.  HENT:  Head: Normocephalic.  Right Ear: External ear normal.  Left Ear: External ear normal.  Eyes: Pupils are equal, round, and reactive to light.  Neck: Normal range of motion. Neck supple.  Cardiovascular: Normal rate, regular rhythm, normal heart sounds and intact distal pulses.   Pulmonary/Chest: Effort normal and breath sounds normal.  Abdominal: Soft. Bowel sounds are normal. There is no tenderness.  Musculoskeletal: Normal range of motion. He exhibits no edema.  Neurological: He is alert and oriented to person, place, and time.  Skin: Skin is warm and dry.  Psychiatric: He has a normal mood and affect. His behavior is normal. Judgment and thought content normal.   BP 134/80 mmHg  Pulse 79  Temp(Src) 97 F (36.1 C) (Oral)  Ht _0  (1.803 m)  Wt 215 lb 8 oz (97.75 kg)  BMI 30.07 kg/m2  Results for orders placed or performed in visit on 08/06/15  POCT glycosylated hemoglobin (Hb A1C)  Result Value Ref Range   Hemoglobin A1C 5.8             Assessment & Plan:  1. COPD with asthma ([place]) Samples of symbicort given  2. Type 2 diabetes mellitus with diabetic polyneuropathy, without long-term current use of insulin (HCC) Low carb diet - POCT glycosylated hemoglobin (Hb A1C) - metFORMIN (GLUCOPHAGE) 500 MG tablet; TAKE 1 TABLET (500 MG TOTAL) BY MOUTH 2 (TWO) TIMES DAILY WITH A MEAL.  Dispense: 180 tablet; Refill: 1 - glucose blood (BAYER CONTOUR TEST) test strip; Test 1X per day and prn    Dx E11.49  Dispense: 300 each; Refill: 1  3. Hypertensive heart disease with heart failure (HCC) Do not add slatt o diet - CMP14+EGFR  4. Coronary artery disease involving other coronary artery bypass graft Keep follow up with cardiology  5. Hyperlipidemia with target LDL less than 100 Low fta deit - fenofibrate 160 MG tablet; Take 1 tablet (160 mg total) by mouth daily.  Dispense: 90 tablet; Refill: 1 - Lipid panel  6. BMI 31.0-31.9,adult Discussed diet and exercise for person with BMI >25 Will recheck weight in 3-6 months      Labs pending Health maintenance reviewed Diet and exercise encouraged Continue all meds Follow up  In 3 momths   [name] [name], [name]

## 2015-08-06 NOTE — Patient Instructions (Signed)

## 2015-08-14 ENCOUNTER — Ambulatory Visit: Payer: Commercial Managed Care - HMO | Admitting: Cardiology

## 2015-09-28 ENCOUNTER — Ambulatory Visit: Payer: Commercial Managed Care - HMO | Admitting: Cardiology

## 2015-10-23 ENCOUNTER — Telehealth: Payer: Self-pay | Admitting: Nurse Practitioner

## 2015-10-23 DIAGNOSIS — E785 Hyperlipidemia, unspecified: Secondary | ICD-10-CM

## 2015-10-23 DIAGNOSIS — E1142 Type 2 diabetes mellitus with diabetic polyneuropathy: Secondary | ICD-10-CM

## 2015-10-23 MED ORDER — GLUCOSE BLOOD VI STRP: ORAL_STRIP | Status: DC

## 2015-10-23 MED ORDER — FENOFIBRATE 160 MG PO TABS: 160.0000 mg | ORAL_TABLET | Freq: Every day | ORAL | Status: DC

## 2015-10-23 MED ORDER — METFORMIN HCL 500 MG PO TABS: ORAL_TABLET | ORAL | Status: DC

## 2015-10-23 NOTE — Telephone Encounter (Signed)
done

## 2015-11-05 ENCOUNTER — Encounter: Payer: Self-pay | Admitting: Nurse Practitioner

## 2015-11-05 ENCOUNTER — Ambulatory Visit (INDEPENDENT_AMBULATORY_CARE_PROVIDER_SITE_OTHER): Payer: Commercial Managed Care - HMO | Admitting: Nurse Practitioner

## 2015-11-05 VITALS — BP 126/66 | HR 73 | Temp 96.8°F | Ht 71.0 in | Wt 216.0 lb

## 2015-11-05 DIAGNOSIS — J45909 Unspecified asthma, uncomplicated: Secondary | ICD-10-CM | POA: Diagnosis not present

## 2015-11-05 DIAGNOSIS — R05 Cough: Secondary | ICD-10-CM

## 2015-11-05 DIAGNOSIS — J449 Chronic obstructive pulmonary disease, unspecified: Secondary | ICD-10-CM

## 2015-11-05 DIAGNOSIS — R059 Cough, unspecified: Secondary | ICD-10-CM

## 2015-11-05 MED ORDER — AZITHROMYCIN 250 MG PO TABS: ORAL_TABLET | ORAL | Status: DC

## 2015-11-05 MED ORDER — BENZONATATE 100 MG PO CAPS: 100.0000 mg | ORAL_CAPSULE | Freq: Three times a day (TID) | ORAL | Status: DC | PRN

## 2015-11-05 NOTE — Patient Instructions (Signed)

## 2015-11-05 NOTE — Progress Notes (Signed)
   Subjective:    Patient ID: [name], male    DOB: 02-12-1933, 82 y.o.   MRN: [id number]  Cough This is a recurrent problem. The current episode started 1 to 4 weeks ago. The problem has been waxing and waning. The problem occurs hourly. The cough is productive of purulent sputum, productive of sputum and productive of blood-tinged sputum. Nothing aggravates the symptoms. He has tried OTC cough suppressant for the symptoms. The treatment provided mild relief. His past medical history is significant for COPD.      Review of Systems  Constitutional: Negative.   HENT: Negative.   Eyes: Negative.   Respiratory: Positive for cough.   Cardiovascular: Negative.   Gastrointestinal: Negative.   Endocrine: Negative.   Genitourinary: Positive for urgency.  Musculoskeletal: Negative.   Skin: Negative.   Allergic/Immunologic: Negative.   Neurological: Negative.   Hematological: Negative.   Psychiatric/Behavioral: Negative.        Objective:   Physical Exam  Constitutional: He is oriented to person, place, and time. He appears well-developed and well-nourished.  HENT:  Right Ear: External ear normal.  Left Ear: External ear normal.  Nose: Nose normal.  Mouth/Throat: Oropharynx is clear and moist.  Eyes: Conjunctivae are normal. Pupils are equal, round, and reactive to light.  Neck: Normal range of motion.  Cardiovascular: Normal rate, regular rhythm and normal heart sounds.   Pulmonary/Chest: Effort normal. He has wheezes.  Tight rhonchi with cough  Abdominal: Soft. Bowel sounds are normal.  Musculoskeletal: He exhibits tenderness.  Tenderness in both shoulders  Neurological: He is alert and oriented to person, place, and time.  Skin: Skin is warm and dry.  Psychiatric: He has a normal mood and affect. His behavior is normal. Judgment and thought content normal.   BP 126/66 mmHg  Pulse 73  Temp(Src) 96.8 F (36 C) (Oral)  Ht 5\' 11"  (1.803 m)  Wt 216 lb (97.977 kg)  BMI  30.14 kg/m2        Assessment & Plan:  1. COPD with asthma ([place]) Continue inhalers as rx  2. Cough 1. Take meds as prescribed 2. Use a cool mist humidifier especially during the winter months and when heat has been humid. 3. Use saline nose sprays frequently 4. Saline irrigations of the nose can be very helpful if done frequently.  * 4X daily for 1 week*  * Use of a nettie pot can be helpful with this. Follow directions with this* 5. Drink plenty of fluids 6. Keep thermostat turn down low 7.For any cough or congestion  Use plain Mucinex- regular strength or max strength is fine   * Children- consult with Pharmacist for dosing 8. For fever or aces or pains- take tylenol or ibuprofen appropriate for age and weight.  * for fevers greater than 101 orally you may alternate ibuprofen and tylenol every  3 hours.   - azithromycin (ZITHROMAX Z-PAK) 250 MG tablet; As directed  Dispense: 1 each; Refill: 0 - benzonatate (TESSALON PERLES) 100 MG capsule; Take 1 capsule (100 mg total) by mouth 3 (three) times daily as needed for cough.  Dispense: 20 capsule; Refill: 0  [name] [name], [name]

## 2015-11-08 ENCOUNTER — Other Ambulatory Visit: Payer: Self-pay | Admitting: *Deleted

## 2015-11-12 ENCOUNTER — Other Ambulatory Visit: Payer: Self-pay | Admitting: Nurse Practitioner

## 2015-11-12 ENCOUNTER — Ambulatory Visit (INDEPENDENT_AMBULATORY_CARE_PROVIDER_SITE_OTHER): Payer: Commercial Managed Care - HMO | Admitting: Nurse Practitioner

## 2015-11-12 ENCOUNTER — Telehealth: Payer: Self-pay | Admitting: Pediatrics

## 2015-11-12 ENCOUNTER — Ambulatory Visit (HOSPITAL_COMMUNITY)
Admission: RE | Admit: 2015-11-12 | Discharge: 2015-11-12 | Disposition: A | Discharge status: Home / Self Care | Payer: Commercial Managed Care - HMO | Source: Ambulatory Visit | Attending: Nurse Practitioner | Admitting: Nurse Practitioner

## 2015-11-12 VITALS — BP 132/63 | HR 77 | Temp 96.8°F | Ht 71.0 in | Wt 212.8 lb

## 2015-11-12 DIAGNOSIS — N281 Cyst of kidney, acquired: Secondary | ICD-10-CM | POA: Diagnosis not present

## 2015-11-12 DIAGNOSIS — R0602 Shortness of breath: Secondary | ICD-10-CM | POA: Diagnosis not present

## 2015-11-12 DIAGNOSIS — I2699 Other pulmonary embolism without acute cor pulmonale: Secondary | ICD-10-CM | POA: Diagnosis present

## 2015-11-12 DIAGNOSIS — M79661 Pain in right lower leg: Secondary | ICD-10-CM | POA: Diagnosis not present

## 2015-11-12 LAB — POCT I-STAT CREATININE: Creatinine, Ser: 1 mg/dL (ref 0.61–1.24)

## 2015-11-12 MED ORDER — IOHEXOL 350 MG/ML SOLN
100.0000 mL | Freq: Once | INTRAVENOUS | Status: AC | PRN
  Administered 2015-11-12: 100 mL via INTRAVENOUS

## 2015-11-12 NOTE — Progress Notes (Signed)
   Subjective:    Patient ID: [name], male    DOB: 02-15-1933, 82 y.o.   MRN: [id number]  HPI Patient brought in by daughter with c/o pain on right lower posterior leg that started on Friday. Pain is constant now.    Review of Systems  Constitutional: Negative.   HENT: Negative.   Respiratory: Negative.   Cardiovascular: Negative.   Genitourinary: Negative.   Neurological: Negative.   Psychiatric/Behavioral: Negative.   All other systems reviewed and are negative.      Objective:   Physical Exam  Constitutional: He is oriented to person, place, and time. He appears well-developed and well-nourished.  Cardiovascular: Normal rate, regular rhythm and normal heart sounds.   Pulmonary/Chest: Effort normal and breath sounds normal.  Musculoskeletal:  Right calf pain- no edema  Neurological: He is alert and oriented to person, place, and time.  Skin: Skin is warm.  Psychiatric: He has a normal mood and affect. His behavior is normal. Judgment and thought content normal.   BP 132/63 mmHg  Pulse 77  Temp(Src) 96.8 F (36 C) (Oral)  Ht 5\' 11"  (1.803 m)  Wt 212 lb 12.8 oz (96.525 kg)  BMI 29.69 kg/m2  SpO2 98%        Assessment & Plan:   1. Right calf pain   2. SOB (shortness of breath)    Orders Placed This Encounter  Procedures  . US Venous Img Lower Unilateral Right    Standing Status: Future     Number of Occurrences:      Standing Expiration Date: 01/11/2017    Order Specific Question:  Reason for Exam (SYMPTOM  OR DIAGNOSIS REQUIRED)    Answer:  right calf pain    Order Specific Question:  Preferred imaging location?    Answer:  [facility]  . CT Chest W Contrast    Standing Status: Future     Number of Occurrences:      Standing Expiration Date: 01/11/2017    Order Specific Question:  If indicated for the ordered procedure, I authorize the administration of contrast media per Radiology protocol    Answer:  Yes    Order Specific Question:   Reason for Exam (SYMPTOM  OR DIAGNOSIS REQUIRED)    Answer:  ?PE- Dyspnea    Order Specific Question:  Preferred imaging location?    Answer:  [facility]   Go straight to hospital Will call with report [name] [name], [name]

## 2015-11-12 NOTE — Patient Instructions (Signed)
Deep Vein Thrombosis °A deep vein thrombosis (DVT) is a blood clot (thrombus) that usually occurs in a deep, larger vein of the lower leg or the pelvis, or in an upper extremity such as the arm. These are dangerous and can lead to serious and even life-threatening complications if the clot travels to the lungs. °A DVT can damage the valves in your leg veins so that instead of flowing upward, the blood pools in the lower leg. This is called post-thrombotic syndrome, and it can result in pain, swelling, discoloration, and sores on the leg. °CAUSES °A DVT is caused by the formation of a blood clot in your leg, pelvis, or arm. Usually, several things contribute to the formation of blood clots. A clot may develop when: °· Your blood flow slows down. °· Your vein becomes damaged in some way. °· You have a condition that makes your blood clot more easily. °RISK FACTORS °A DVT is more likely to develop in: °· People who are older, especially over 60 years of age. °· People who are overweight (obese). °· People who sit or lie still for a long time, such as during long-distance travel (over 4 hours), bed rest, hospitalization, or during recovery from certain medical conditions like a stroke. °· People who do not engage in much physical activity (sedentary lifestyle). °· People who have chronic breathing disorders. °· People who have a personal or family history of blood clots or blood clotting disease. °· People who have peripheral vascular disease (PVD), diabetes, or some types of cancer. °· People who have heart disease, especially if the person had a recent heart attack or has congestive heart failure. °· People who have neurological diseases that affect the legs (leg paresis). °· People who have had a traumatic injury, such as breaking a hip or leg. °· People who have recently had major or lengthy surgery, especially on the hip, knee, or abdomen. °· People who have had a central line placed inside a large vein. °· People  who take medicines that contain the hormone estrogen. These include birth control pills and hormone replacement therapy. °· Pregnancy or during childbirth or the postpartum period. °· Long plane flights (over 8 hours). °SIGNS AND SYMPTOMS °Symptoms of a DVT can include:  °· Swelling of your leg or arm, especially if one side is much worse. °· Warmth and redness of your leg or arm, especially if one side is much worse. °· Pain in your arm or leg. If the clot is in your leg, symptoms may be more noticeable or worse when you stand or walk. °· A feeling of pins and needles, if the clot is in the arm. °The symptoms of a DVT that has traveled to the lungs (pulmonary embolism, PE) usually start suddenly and include: °· Shortness of breath while active or at rest. °· Coughing or coughing up blood or blood-tinged mucus. °· Chest pain that is often worse with deep breaths. °· Rapid or irregular heartbeat. °· Feeling light-headed or dizzy. °· Fainting. °· Feeling anxious. °· Sweating. °There may also be pain and swelling in a leg if that is where the blood clot started. °These symptoms may represent a serious problem that is an emergency. Do not wait to see if the symptoms will go away. Get medical help right away. Call your local emergency services (911 in the [place] Do not drive yourself to the hospital. °DIAGNOSIS °Your health care provider will take a medical history and perform a physical exam. You may also   have other tests, including: °· Blood tests to assess the clotting properties of your blood. °· Imaging tests, such as CT, ultrasound, MRI, X-ray, and other tests to see if you have clots anywhere in your body. °TREATMENT °After a DVT is identified, it can be treated. The type of treatment that you receive depends on many factors, such as the cause of your DVT, your risk for bleeding or developing more clots, and other medical conditions that you have. Sometimes, a combination of treatments is necessary. Treatment  options may be combined and include: °· Monitoring the blood clot with ultrasound. °· Taking medicines by mouth, such as newer blood thinners (anticoagulants), thrombolytics, or warfarin. °· Taking anticoagulant medicine by injection or through an IV tube. °· Wearing compression stockings or using different types of devices. °· Surgery (rare) to remove the blood clot or to place a filter in your abdomen to stop the blood clot from traveling to your lungs. °Treatments for a DVT are often divided into immediate treatment and long-term treatment (up to 3 months after DVT). You can work with your health care provider to choose the treatment program that is best for you. °HOME CARE INSTRUCTIONS °If you are taking a newer oral anticoagulant: °· Take the medicine every single day at the same time each day. °· Understand what foods and drugs interact with this medicine. °· Understand that there are no regular blood tests required when using this medicine. °· Understand the side effects of this medicine, including excessive bruising or bleeding. Ask your health care provider or pharmacist about other possible side effects. °If you are taking warfarin: °· Understand how to take warfarin and know which foods can affect how warfarin works in your body. °· Understand that it is dangerous to take too much or too little warfarin. Too much warfarin increases the risk of bleeding. Too little warfarin continues to allow the risk for blood clots. °· Follow your PT and INR blood testing schedule. The PT and INR results allow your health care provider to adjust your dose of warfarin. It is very important that you have your PT and INR tested as often as told by your health care provider. °· Avoid major changes in your diet, or tell your health care provider before you change your diet. Arrange a visit with a registered dietitian to answer your questions. Many foods, especially foods that are high in vitamin K, can interfere with warfarin  and affect the PT and INR results. Eat a consistent amount of foods that are high in vitamin K, such as: °¨ Spinach, kale, broccoli, cabbage, collard greens, turnip greens, Brussels sprouts, peas, cauliflower, seaweed, and parsley. °¨ Beef liver and pork liver. °¨ Green tea. °¨ Soybean oil. °· Tell your health care provider about any and all medicines, vitamins, and supplements that you take, including aspirin and other over-the-counter anti-inflammatory medicines. Be especially cautious with aspirin and anti-inflammatory medicines. Do not take those before you ask your health care provider if it is safe to do so. This is important because many medicines can interfere with warfarin and affect the PT and INR results. °· Do not start or stop taking any over-the-counter or prescription medicine unless your health care provider or pharmacist tells you to do so. °If you take warfarin, you will also need to do these things: °· Hold pressure over cuts for longer than usual. °· Tell your dentist and other health care providers that you are taking warfarin before you have any procedures in which   bleeding may occur. °· Avoid alcohol or drink very small amounts. Tell your health care provider if you change your alcohol intake. °· Do not use tobacco products, including cigarettes, chewing tobacco, and e-cigarettes. If you need help quitting, ask your health care provider. °· Avoid contact sports. °General Instructions °· Take over-the-counter and prescription medicines only as told by your health care provider. Anticoagulant medicines can have side effects, including easy bruising and difficulty stopping bleeding. If you are prescribed an anticoagulant, you will also need to do these things: °¨ Hold pressure over cuts for longer than usual. °¨ Tell your dentist and other health care providers that you are taking anticoagulants before you have any procedures in which bleeding may occur. °¨ Avoid contact sports. °· Wear a medical  alert bracelet or carry a medical alert card that says you have had a PE. °· Ask your health care provider how soon you can go back to your normal activities. Stay active to prevent new blood clots from forming. °· Make sure to exercise while traveling or when you have been sitting or standing for a long period of time. It is very important to exercise. Exercise your legs by walking or by tightening and relaxing your leg muscles often. Take frequent walks. °· Wear compression stockings as told by your health care provider to help prevent more blood clots from forming. °· Do not use tobacco products, including cigarettes, chewing tobacco, and e-cigarettes. If you need help quitting, ask your health care provider. °· Keep all follow-up appointments with your health care provider. This is important. °PREVENTION °Take these actions to decrease your risk of developing another DVT: °· Exercise regularly. For at least 30 minutes every day, engage in: °¨ Activity that involves moving your arms and legs. °¨ Activity that encourages good blood flow through your body by increasing your heart rate. °· Exercise your arms and legs every hour during long-distance travel (over 4 hours). Drink plenty of water and avoid drinking alcohol while traveling. °· Avoid sitting or lying in bed for long periods of time without moving your legs. °· Maintain a weight that is appropriate for your height. Ask your health care provider what weight is healthy for you. °· If you are a woman who is over 35 years of age, avoid unnecessary use of medicines that contain estrogen. These include birth control pills. °· Do not smoke, especially if you take estrogen medicines. If you need help quitting, ask your health care provider. °If you are hospitalized, prevention measures may include: °· Early walking after surgery, as soon as your health care provider says that it is safe. °· Receiving anticoagulants to prevent blood clots. If you cannot take  anticoagulants, other options may be available, such as wearing compression stockings or using different types of devices. °SEEK IMMEDIATE MEDICAL CARE IF: °· You have new or increased pain, swelling, or redness in an arm or leg. °· You have numbness or tingling in an arm or leg. °· You have shortness of breath while active or at rest. °· You have chest pain. °· You have a rapid or irregular heartbeat. °· You feel light-headed or dizzy. °· You cough up blood. °· You notice blood in your vomit, bowel movement, or urine. °These symptoms may represent a serious problem that is an emergency. Do not wait to see if the symptoms will go away. Get medical help right away. Call your local emergency services (911 in the [place] Do not drive yourself to the hospital. °  °  This information is not intended to replace advice given to you by your health care provider. Make sure you discuss any questions you have with your health care provider. °  °Document Released: 08/11/2005 Document Revised: 05/02/2015 Document Reviewed: 12/06/2014 °Elsevier Interactive Patient Education ©2016 [employer] ° °

## 2015-11-12 NOTE — Telephone Encounter (Signed)
Talked with daughter per pt request, no PE on CTA. Has doppler u/s scheduled for tomorrow at 1pm.

## 2015-11-13 ENCOUNTER — Ambulatory Visit (HOSPITAL_COMMUNITY)
Admission: RE | Admit: 2015-11-13 | Discharge: 2015-11-13 | Disposition: A | Discharge status: Home / Self Care | Payer: Commercial Managed Care - HMO | Source: Ambulatory Visit | Attending: Nurse Practitioner | Admitting: Nurse Practitioner

## 2015-11-13 DIAGNOSIS — M79661 Pain in right lower leg: Secondary | ICD-10-CM | POA: Diagnosis not present

## 2015-11-20 ENCOUNTER — Encounter: Payer: Self-pay | Admitting: Nurse Practitioner

## 2015-11-20 ENCOUNTER — Ambulatory Visit (INDEPENDENT_AMBULATORY_CARE_PROVIDER_SITE_OTHER): Payer: Commercial Managed Care - HMO | Admitting: Nurse Practitioner

## 2015-11-20 VITALS — BP 117/60 | HR 64 | Temp 97.0°F | Ht 71.0 in | Wt 209.0 lb

## 2015-11-20 DIAGNOSIS — E1142 Type 2 diabetes mellitus with diabetic polyneuropathy: Secondary | ICD-10-CM | POA: Diagnosis not present

## 2015-11-20 DIAGNOSIS — Z6829 Body mass index (BMI) 29.0-29.9, adult: Secondary | ICD-10-CM

## 2015-11-20 DIAGNOSIS — G4733 Obstructive sleep apnea (adult) (pediatric): Secondary | ICD-10-CM | POA: Diagnosis not present

## 2015-11-20 DIAGNOSIS — E785 Hyperlipidemia, unspecified: Secondary | ICD-10-CM

## 2015-11-20 DIAGNOSIS — I110 Hypertensive heart disease with heart failure: Secondary | ICD-10-CM | POA: Diagnosis not present

## 2015-11-20 DIAGNOSIS — J449 Chronic obstructive pulmonary disease, unspecified: Secondary | ICD-10-CM | POA: Diagnosis not present

## 2015-11-20 DIAGNOSIS — K219 Gastro-esophageal reflux disease without esophagitis: Secondary | ICD-10-CM | POA: Diagnosis not present

## 2015-11-20 DIAGNOSIS — N3946 Mixed incontinence: Secondary | ICD-10-CM | POA: Diagnosis not present

## 2015-11-20 DIAGNOSIS — J45909 Unspecified asthma, uncomplicated: Secondary | ICD-10-CM | POA: Diagnosis not present

## 2015-11-20 DIAGNOSIS — I25810 Atherosclerosis of coronary artery bypass graft(s) without angina pectoris: Secondary | ICD-10-CM | POA: Diagnosis not present

## 2015-11-20 DIAGNOSIS — I6320 Cerebral infarction due to unspecified occlusion or stenosis of unspecified precerebral arteries: Secondary | ICD-10-CM | POA: Diagnosis not present

## 2015-11-20 DIAGNOSIS — Z125 Encounter for screening for malignant neoplasm of prostate: Secondary | ICD-10-CM

## 2015-11-20 DIAGNOSIS — D696 Thrombocytopenia, unspecified: Secondary | ICD-10-CM | POA: Diagnosis not present

## 2015-11-20 LAB — BAYER DCA HB A1C WAIVED: HB A1C (BAYER DCA - WAIVED): 5.8 % (ref <7.0)

## 2015-11-20 MED ORDER — V-2 HIGH COMPRESSION HOSE MISC: 1.0000 | Freq: Every day | Status: DC

## 2015-11-20 MED ORDER — FENOFIBRATE 160 MG PO TABS: 160.0000 mg | ORAL_TABLET | Freq: Every day | ORAL | Status: DC

## 2015-11-20 MED ORDER — BUDESONIDE-FORMOTEROL FUMARATE 160-4.5 MCG/ACT IN AERO: 2.0000 | INHALATION_SPRAY | Freq: Two times a day (BID) | RESPIRATORY_TRACT | Status: DC

## 2015-11-20 MED ORDER — METFORMIN HCL 500 MG PO TABS: ORAL_TABLET | ORAL | Status: DC

## 2015-11-20 MED ORDER — SOLIFENACIN SUCCINATE 10 MG PO TABS: 10.0000 mg | ORAL_TABLET | Freq: Every day | ORAL | Status: DC

## 2015-11-20 MED ORDER — OMEPRAZOLE 20 MG PO CPDR: 20.0000 mg | DELAYED_RELEASE_CAPSULE | Freq: Every day | ORAL | Status: DC

## 2015-11-20 NOTE — Progress Notes (Signed)
Subjective:    Patient ID: [name], male    DOB: 1933/07/28, 82 y.o.   MRN: [id number]  Patient here today for follow up of chronic medical problems.  Outpatient Encounter Prescriptions as of 11/20/2015  Medication Sig  . aspirin 81 MG tablet Take 81 mg by mouth daily.  . budesonide-formoterol (SYMBICORT) 160-4.5 MCG/ACT inhaler Inhale 2 puffs into the lungs 2 (two) times daily.  . fenofibrate 160 MG tablet Take 1 tablet (160 mg total) by mouth daily.  [name] glucose blood (BAYER CONTOUR TEST) test strip Test 1 - 2 times a day. Dx E11.49  . meclizine (ANTIVERT) 12.5 MG tablet Take 1 tablet (12.5 mg total) by mouth 3 (three) times daily as needed for dizziness.  . metFORMIN (GLUCOPHAGE) 500 MG tablet TAKE 1 TABLET (500 MG TOTAL) BY MOUTH 2 (TWO) TIMES DAILY WITH A MEAL.  . naproxen sodium (ANAPROX) 220 MG tablet Take 220 mg by mouth at bedtime as needed.    Heartburn He complains of heartburn. This is a recurrent problem. The current episode started more than 1 year ago. The problem occurs occasionally. The problem has been gradually worsening. Nothing aggravates the symptoms. He has tried nothing for the symptoms.  Diabetes He presents for his follow-up diabetic visit. He has type 2 diabetes mellitus. No MedicAlert identification noted. His disease course has been improving. There are no hypoglycemic associated symptoms. Pertinent negatives for diabetes include no visual change. Diabetic complications include a CVA and retinopathy. Risk factors for coronary artery disease include diabetes mellitus, dyslipidemia, family history, male sex, obesity and sedentary lifestyle. Current diabetic treatment includes oral agent (monotherapy). He is compliant with treatment some of the time. His weight is stable. When asked about meal planning, he reported none. He never participates in exercise. His breakfast blood glucose is taken between 8-9 am. His breakfast blood glucose range is generally 130-140 mg/dl.  An ACE inhibitor/angiotensin II receptor blocker is not being taken. He does not see a podiatrist.Eye exam is not current.  Hyperlipidemia This is a chronic problem. The current episode started more than 1 year ago. The problem is uncontrolled. Recent lipid tests were reviewed and are variable. He is currently on no antihyperlipidemic treatment. The current treatment provides moderate improvement of lipids. Compliance problems include adherence to diet and adherence to exercise.  Risk factors for coronary artery disease include diabetes mellitus, dyslipidemia, hypertension, male sex and obesity.  Hypertension This is a chronic problem. The current episode started more than 1 year ago. The problem is unchanged. The problem is uncontrolled. Pertinent negatives include no palpitations. Risk factors for coronary artery disease include diabetes mellitus, dyslipidemia, male gender, obesity and sedentary lifestyle. Past treatments include nothing. Hypertensive end-organ damage includes CAD/MI, CVA, heart failure and retinopathy.  COPD symbicort helps- has some sob with exertion. thrombocytopenia  Has not had blood checked in awhile.   * Hx of CVA, AAA and CAD- sees cardiologist every 6 months  Review of Systems  Constitutional: Negative.   HENT: Negative.   Cardiovascular: Negative for palpitations.  Gastrointestinal: Positive for heartburn.  Genitourinary: Negative.   Musculoskeletal: Positive for gait problem.       Uses a cane.   Neurological: Negative for numbness.  Psychiatric/Behavioral: Negative.   All other systems reviewed and are negative.      Objective:   Physical Exam  Constitutional: He is oriented to person, place, and time. He appears well-developed and well-nourished.  HENT:  Head: Normocephalic.  Right Ear: External ear normal.  Left Ear: External ear normal.  Eyes: Pupils are equal, round, and reactive to light.  Neck: Normal range of motion. Neck supple.   Cardiovascular: Normal rate, regular rhythm, normal heart sounds and intact distal pulses.   Pulmonary/Chest: Effort normal and breath sounds normal.  Abdominal: Soft. Bowel sounds are normal. There is no tenderness.  Musculoskeletal: Normal range of motion. He exhibits no edema.  Neurological: He is alert and oriented to person, place, and time.  Skin: Skin is warm and dry.  Psychiatric: He has a normal mood and affect. His behavior is normal. Judgment and thought content normal.   BP 117/60 mmHg  Pulse 64  Temp(Src) 97 F (36.1 C) (Oral)  Ht '5\' 11"'$  (1.803 m)  Wt 209 lb (94.802 kg)  BMI 29.16 kg/m2  HGBA1c- 5.8%     Assessment & Plan:  1. Type 2 diabetes mellitus with diabetic polyneuropathy, without long-term current use of insulin ([name]) Continue to watch carbs in diet - Bayer DCA Hb A1c Waived - Bayer DCA Hb A1c Waived - metFORMIN (GLUCOPHAGE) 500 MG tablet; TAKE 1 TABLET (500 MG TOTAL) BY MOUTH 2 (TWO) TIMES DAILY WITH A MEAL.  Dispense: 180 tablet; Refill: 1  2. Hyperlipidemia with target LDL less than 100 Low fat diet - Lipid panel - fenofibrate 160 MG tablet; Take 1 tablet (160 mg total) by mouth daily.  Dispense: 90 tablet; Refill: 1  3. Hypertensive heart disease with heart failure ([name]) Do not add salt to diet - CMP14+EGFR  4. Prostate cancer screening - PSA, total and free  5. Cerebrovascular accident (CVA) due to stenosis of precerebral artery ([name])  6. Coronary artery disease involving other coronary artery bypass graft Keep follow up with cardiology  7. OSA (obstructive sleep apnea) CPAP  8. COPD with asthma ([name]) - budesonide-formoterol (SYMBICORT) 160-4.5 MCG/ACT inhaler; Inhale 2 puffs into the lungs 2 (two) times daily.  Dispense: 1 Inhaler; Refill: 5  9. BMI 29.0-29.9,adult Discussed diet and exercise for person with BMI >25 Will recheck weight in 3-6 months  10. Thrombocytopenia ([name]) Labs being checked - CBC with  Differential/Platelet  11. Mixed incontinence Added vesicare - solifenacin (VESICARE) 10 MG tablet; Take 1 tablet (10 mg total) by mouth daily.  Dispense: 30 tablet; Refill: 5  12. Gastroesophageal reflux disease without esophagitis Avoid spicy foods Do not eat 2 hours prior to bedtime - omeprazole (PRILOSEC) 20 MG capsule; Take 1 capsule (20 mg total) by mouth daily.  Dispense: 30 capsule; Refill: 3    Labs pending Health maintenance reviewed Diet and exercise encouraged Continue all meds Follow up  In 3 month   [place], [name]

## 2015-11-20 NOTE — Patient Instructions (Signed)

## 2015-11-20 NOTE — Addendum Note (Signed)
Addended by: [name] on: 11/20/2015 09:35 AM   Modules accepted: Orders

## 2015-11-21 ENCOUNTER — Telehealth: Payer: Self-pay | Admitting: Nurse Practitioner

## 2015-11-21 ENCOUNTER — Other Ambulatory Visit: Payer: Self-pay | Admitting: Nurse Practitioner

## 2015-11-21 DIAGNOSIS — R972 Elevated prostate specific antigen [PSA]: Secondary | ICD-10-CM

## 2015-11-21 DIAGNOSIS — Z8042 Family history of malignant neoplasm of prostate: Secondary | ICD-10-CM

## 2015-11-21 LAB — CMP14+EGFR
A/G RATIO: 2.2 (ref 1.2–2.2)
ALK PHOS: 41 IU/L (ref 39–117)
ALT: 18 IU/L (ref 0–44)
AST: 21 IU/L (ref 0–40)
Albumin: 4.4 g/dL (ref 3.5–4.7)
BILIRUBIN TOTAL: 0.9 mg/dL (ref 0.0–1.2)
BUN / CREAT RATIO: 18 (ref 10–22)
BUN: 19 mg/dL (ref 8–27)
CO2: 24 mmol/L (ref 18–29)
Calcium: 9.8 mg/dL (ref 8.6–10.2)
Chloride: 104 mmol/L (ref 96–106)
Creatinine, Ser: 1.05 mg/dL (ref 0.76–1.27)
GFR calc Af Amer: 77 mL/min/{1.73_m2} (ref >59)
GFR calc non Af Amer: 67 mL/min/{1.73_m2} (ref >59)
GLOBULIN, TOTAL: 2 g/dL (ref 1.5–4.5)
Glucose: 116 mg/dL — ABNORMAL HIGH (ref 65–99)
POTASSIUM: 4.7 mmol/L (ref 3.5–5.2)
SODIUM: 146 mmol/L — AB (ref 134–144)
Total Protein: 6.4 g/dL (ref 6.0–8.5)

## 2015-11-21 LAB — PSA, TOTAL AND FREE
PROSTATE SPECIFIC AG, SERUM: 5.2 ng/mL — AB (ref 0.0–4.0)
PSA FREE: 0.41 ng/mL
PSA, Free Pct: 7.9 %

## 2015-11-21 LAB — CBC WITH DIFFERENTIAL/PLATELET
BASOS ABS: 0 10*3/uL (ref 0.0–0.2)
Basos: 0 %
EOS (ABSOLUTE): 0.2 10*3/uL (ref 0.0–0.4)
Eos: 4 %
HEMOGLOBIN: 15 g/dL (ref 12.6–17.7)
Hematocrit: 44.1 % (ref 37.5–51.0)
IMMATURE GRANS (ABS): 0 10*3/uL (ref 0.0–0.1)
Immature Granulocytes: 0 %
LYMPHS: 18 %
Lymphocytes Absolute: 1 10*3/uL (ref 0.7–3.1)
MCH: 32.4 pg (ref 26.6–33.0)
MCHC: 34 g/dL (ref 31.5–35.7)
MCV: 95 fL (ref 79–97)
MONOCYTES: 11 %
Monocytes Absolute: 0.6 10*3/uL (ref 0.1–0.9)
NEUTROS ABS: 3.7 10*3/uL (ref 1.4–7.0)
Neutrophils: 67 %
Platelets: 125 10*3/uL — ABNORMAL LOW (ref 150–379)
RBC: 4.63 x10E6/uL (ref 4.14–5.80)
RDW: 15.1 % (ref 12.3–15.4)
WBC: 5.6 10*3/uL (ref 3.4–10.8)

## 2015-11-21 LAB — LIPID PANEL
CHOLESTEROL TOTAL: 169 mg/dL (ref 100–199)
Chol/HDL Ratio: 5.3 ratio units — ABNORMAL HIGH (ref 0.0–5.0)
HDL: 32 mg/dL — ABNORMAL LOW (ref >39)
LDL Calculated: 108 mg/dL — ABNORMAL HIGH (ref 0–99)
TRIGLYCERIDES: 143 mg/dL (ref 0–149)
VLDL Cholesterol Cal: 29 mg/dL (ref 5–40)

## 2015-11-21 MED ORDER — FESOTERODINE FUMARATE ER 4 MG PO TB24: 4.0000 mg | ORAL_TABLET | Freq: Every day | ORAL | Status: DC

## 2015-11-21 NOTE — Telephone Encounter (Signed)
Changed vesicare to Lisbeth Ply- patient aware

## 2015-11-22 ENCOUNTER — Other Ambulatory Visit: Payer: Self-pay

## 2015-11-22 DIAGNOSIS — K219 Gastro-esophageal reflux disease without esophagitis: Secondary | ICD-10-CM

## 2015-11-22 MED ORDER — FESOTERODINE FUMARATE ER 4 MG PO TB24: 4.0000 mg | ORAL_TABLET | Freq: Every day | ORAL | Status: DC

## 2015-11-22 MED ORDER — OMEPRAZOLE 20 MG PO CPDR: 20.0000 mg | DELAYED_RELEASE_CAPSULE | Freq: Every day | ORAL | Status: DC

## 2015-11-26 ENCOUNTER — Ambulatory Visit (INDEPENDENT_AMBULATORY_CARE_PROVIDER_SITE_OTHER): Payer: Commercial Managed Care - HMO | Admitting: Cardiology

## 2015-11-26 ENCOUNTER — Encounter: Payer: Self-pay | Admitting: Cardiology

## 2015-11-26 VITALS — BP 120/65 | HR 68 | Ht 71.0 in | Wt 213.2 lb

## 2015-11-26 DIAGNOSIS — I10 Essential (primary) hypertension: Secondary | ICD-10-CM

## 2015-11-26 DIAGNOSIS — J449 Chronic obstructive pulmonary disease, unspecified: Secondary | ICD-10-CM

## 2015-11-26 DIAGNOSIS — E785 Hyperlipidemia, unspecified: Secondary | ICD-10-CM | POA: Diagnosis not present

## 2015-11-26 DIAGNOSIS — I2510 Atherosclerotic heart disease of native coronary artery without angina pectoris: Secondary | ICD-10-CM

## 2015-11-26 MED ORDER — ATORVASTATIN CALCIUM 80 MG PO TABS: 80.0000 mg | ORAL_TABLET | Freq: Every day | ORAL | Status: DC

## 2015-11-26 MED ORDER — ALBUTEROL SULFATE HFA 108 (90 BASE) MCG/ACT IN AERS: 2.0000 | INHALATION_SPRAY | Freq: Four times a day (QID) | RESPIRATORY_TRACT | Status: DC | PRN

## 2015-11-26 NOTE — Patient Instructions (Signed)
Your physician wants you to follow-up in: [place] [address] [facility]AN[facility] You will receive a reminder letter in the mail two months in advance. If you don't receive a letter, please call our office to schedule the follow-up appointment.  Your physician has recommended you make the following change in your medication:   STOP FENOFIBRATE  START ATORVASTATIN 80 MG DAILY  USE INHALER 1-2 PUFFS EVERY 6 HOURS AS NEEDED.   Thank you for choosing [facility]!!
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
## 2015-12-03 ENCOUNTER — Telehealth: Payer: Self-pay | Admitting: Nurse Practitioner

## 2015-12-04 MED ORDER — FESOTERODINE FUMARATE ER 4 MG PO TB24: 4.0000 mg | ORAL_TABLET | Freq: Every day | ORAL | Status: DC

## 2015-12-04 NOTE — Telephone Encounter (Signed)
Patient informed that prescription was sent to [facility]

## 2015-12-04 NOTE — Telephone Encounter (Signed)
Yes Lisbeth Ply rx has been sent to [facility]

## 2016-01-11 ENCOUNTER — Other Ambulatory Visit: Payer: Self-pay | Admitting: Urology

## 2016-01-11 ENCOUNTER — Ambulatory Visit (INDEPENDENT_AMBULATORY_CARE_PROVIDER_SITE_OTHER): Payer: Commercial Managed Care - HMO | Admitting: Urology

## 2016-01-11 DIAGNOSIS — R972 Elevated prostate specific antigen [PSA]: Secondary | ICD-10-CM

## 2016-01-11 DIAGNOSIS — N3941 Urge incontinence: Secondary | ICD-10-CM | POA: Diagnosis not present

## 2016-01-11 DIAGNOSIS — C61 Malignant neoplasm of prostate: Secondary | ICD-10-CM

## 2016-01-16 ENCOUNTER — Encounter (HOSPITAL_COMMUNITY): Payer: Self-pay

## 2016-01-16 ENCOUNTER — Encounter (HOSPITAL_COMMUNITY)
Admission: RE | Admit: 2016-01-16 | Discharge: 2016-01-16 | Disposition: A | Discharge status: Home / Self Care | Payer: Commercial Managed Care - HMO | Source: Ambulatory Visit | Attending: Urology | Admitting: Urology

## 2016-01-16 DIAGNOSIS — R948 Abnormal results of function studies of other organs and systems: Secondary | ICD-10-CM | POA: Diagnosis not present

## 2016-01-16 DIAGNOSIS — C61 Malignant neoplasm of prostate: Secondary | ICD-10-CM

## 2016-01-16 DIAGNOSIS — R937 Abnormal findings on diagnostic imaging of other parts of musculoskeletal system: Secondary | ICD-10-CM | POA: Insufficient documentation

## 2016-01-16 MED ORDER — TECHNETIUM TC 99M MEDRONATE IV KIT
25.0000 | PACK | Freq: Once | INTRAVENOUS | Status: AC | PRN
  Administered 2016-01-16: 20.8 via INTRAVENOUS

## 2016-01-22 ENCOUNTER — Ambulatory Visit (HOSPITAL_COMMUNITY)
Admission: RE | Admit: 2016-01-22 | Discharge: 2016-01-22 | Disposition: A | Discharge status: Home / Self Care | Payer: Commercial Managed Care - HMO | Source: Ambulatory Visit | Attending: Urology | Admitting: Urology

## 2016-01-22 DIAGNOSIS — I2510 Atherosclerotic heart disease of native coronary artery without angina pectoris: Secondary | ICD-10-CM | POA: Diagnosis not present

## 2016-01-22 DIAGNOSIS — N281 Cyst of kidney, acquired: Secondary | ICD-10-CM | POA: Diagnosis not present

## 2016-01-22 DIAGNOSIS — K760 Fatty (change of) liver, not elsewhere classified: Secondary | ICD-10-CM | POA: Insufficient documentation

## 2016-01-22 DIAGNOSIS — N400 Enlarged prostate without lower urinary tract symptoms: Secondary | ICD-10-CM | POA: Insufficient documentation

## 2016-01-22 DIAGNOSIS — C61 Malignant neoplasm of prostate: Secondary | ICD-10-CM | POA: Diagnosis not present

## 2016-01-22 DIAGNOSIS — N200 Calculus of kidney: Secondary | ICD-10-CM | POA: Insufficient documentation

## 2016-01-22 DIAGNOSIS — I723 Aneurysm of iliac artery: Secondary | ICD-10-CM | POA: Insufficient documentation

## 2016-01-22 LAB — POCT I-STAT CREATININE: CREATININE: 0.9 mg/dL (ref 0.61–1.24)

## 2016-01-22 MED ORDER — IOPAMIDOL (ISOVUE-300) INJECTION 61%
100.0000 mL | Freq: Once | INTRAVENOUS | Status: AC | PRN
  Administered 2016-01-22: 100 mL via INTRAVENOUS

## 2016-01-22 MED ORDER — BARIUM SULFATE 2.1 % PO SUSP
ORAL | Status: AC
  Filled 2016-01-22: qty 2

## 2016-02-13 ENCOUNTER — Ambulatory Visit (INDEPENDENT_AMBULATORY_CARE_PROVIDER_SITE_OTHER): Payer: Commercial Managed Care - HMO | Admitting: Urology

## 2016-02-13 DIAGNOSIS — C61 Malignant neoplasm of prostate: Secondary | ICD-10-CM

## 2016-02-13 DIAGNOSIS — N3941 Urge incontinence: Secondary | ICD-10-CM

## 2016-02-13 DIAGNOSIS — N401 Enlarged prostate with lower urinary tract symptoms: Secondary | ICD-10-CM

## 2016-02-14 ENCOUNTER — Other Ambulatory Visit: Payer: Self-pay | Admitting: Urology

## 2016-02-14 DIAGNOSIS — R972 Elevated prostate specific antigen [PSA]: Secondary | ICD-10-CM

## 2016-03-03 ENCOUNTER — Ambulatory Visit: Payer: Commercial Managed Care - HMO | Admitting: Nurse Practitioner

## 2016-03-03 ENCOUNTER — Telehealth: Payer: Self-pay | Admitting: Nurse Practitioner

## 2016-03-03 NOTE — Telephone Encounter (Signed)
Not on patient chart- rx by urologist- patient told to contact urologist for refill.

## 2016-03-05 ENCOUNTER — Encounter (HOSPITAL_COMMUNITY): Payer: Self-pay

## 2016-03-05 ENCOUNTER — Ambulatory Visit (HOSPITAL_COMMUNITY)
Admission: RE | Admit: 2016-03-05 | Discharge: 2016-03-05 | Disposition: A | Discharge status: Home / Self Care | Payer: Commercial Managed Care - HMO | Source: Ambulatory Visit | Attending: Urology | Admitting: Urology

## 2016-03-05 DIAGNOSIS — R972 Elevated prostate specific antigen [PSA]: Secondary | ICD-10-CM

## 2016-03-05 MED ORDER — LIDOCAINE HCL (PF) 2 % IJ SOLN
INTRAMUSCULAR | Status: AC
  Administered 2016-03-05: 10 mL
  Filled 2016-03-05: qty 10

## 2016-03-05 MED ORDER — GENTAMICIN SULFATE 40 MG/ML IJ SOLN
80.0000 mg | Freq: Once | INTRAMUSCULAR | Status: AC
  Administered 2016-03-05: 80 mg via INTRAMUSCULAR

## 2016-03-05 MED ORDER — GENTAMICIN SULFATE 40 MG/ML IJ SOLN
INTRAMUSCULAR | Status: AC
  Administered 2016-03-05: 80 mg via INTRAMUSCULAR
  Filled 2016-03-05: qty 2

## 2016-03-05 NOTE — Discharge Instructions (Signed)
Transrectal Ultrasound-Guided Biopsy °A transrectal ultrasound-guided biopsy is a procedure to take samples of tissue from your prostate. Ultrasound images are used to guide the procedure. It is usually done to check the prostate gland for cancer. °BEFORE THE PROCEDURE °· Do not eat or drink after midnight on the night before your procedure. °· Take medicines as your doctor tells you. °· Your doctor may have you stop taking some medicines 5-7 days before the procedure. °· You will be given an enema before your procedure. During an enema, a liquid is put into your butt (rectum) to clear out waste. °· You may have lab tests the day of your procedure. °· Make plans to have someone drive you home. °PROCEDURE °· You will be given medicine to help you relax before the procedure. An IV tube will be put into one of your veins. It will be used to give fluids and medicine. °· You will be given medicine to reduce the risk of infection (antibiotic). °· You will be placed on your side. °· A probe with gel will be put in your butt. This is used to take pictures of your prostate and the area around it. °· A medicine to numb the area is put into your prostate. °· A biopsy needle is then inserted and guided to your prostate. °· Samples of prostate tissue are taken. The needle is removed. °· The samples are sent to a lab to be checked. Results are usually back in 2-3 days. °AFTER THE PROCEDURE °· You will be taken to a room where you will be watched until you are doing okay. °· You may have some pain in the area around your butt. You will be given medicines for this. °· You may be able to go home the same day. Sometimes, an overnight stay in the hospital is needed. °  °This information is not intended to replace advice given to you by your health care provider. Make sure you discuss any questions you have with your health care provider. °  °Document Released: 07/30/2009 Document Revised: 08/16/2013 Document Reviewed:  03/30/2013 °Elsevier Interactive Patient Education ©2016 [employer] Inc[employer] ° °

## 2016-03-17 ENCOUNTER — Encounter: Payer: Self-pay | Admitting: Nurse Practitioner

## 2016-03-17 ENCOUNTER — Ambulatory Visit (INDEPENDENT_AMBULATORY_CARE_PROVIDER_SITE_OTHER): Payer: Commercial Managed Care - HMO | Admitting: Nurse Practitioner

## 2016-03-17 VITALS — BP 166/68 | HR 58 | Temp 96.7°F | Ht 71.0 in | Wt 210.0 lb

## 2016-03-17 DIAGNOSIS — D696 Thrombocytopenia, unspecified: Secondary | ICD-10-CM

## 2016-03-17 DIAGNOSIS — Z8673 Personal history of transient ischemic attack (TIA), and cerebral infarction without residual deficits: Secondary | ICD-10-CM

## 2016-03-17 DIAGNOSIS — J449 Chronic obstructive pulmonary disease, unspecified: Secondary | ICD-10-CM | POA: Diagnosis not present

## 2016-03-17 DIAGNOSIS — E1142 Type 2 diabetes mellitus with diabetic polyneuropathy: Secondary | ICD-10-CM

## 2016-03-17 DIAGNOSIS — I25810 Atherosclerosis of coronary artery bypass graft(s) without angina pectoris: Secondary | ICD-10-CM

## 2016-03-17 DIAGNOSIS — Z6829 Body mass index (BMI) 29.0-29.9, adult: Secondary | ICD-10-CM

## 2016-03-17 DIAGNOSIS — C61 Malignant neoplasm of prostate: Secondary | ICD-10-CM | POA: Diagnosis not present

## 2016-03-17 DIAGNOSIS — J4489 Other specified chronic obstructive pulmonary disease: Secondary | ICD-10-CM

## 2016-03-17 DIAGNOSIS — N3946 Mixed incontinence: Secondary | ICD-10-CM | POA: Diagnosis not present

## 2016-03-17 DIAGNOSIS — G4733 Obstructive sleep apnea (adult) (pediatric): Secondary | ICD-10-CM

## 2016-03-17 DIAGNOSIS — E785 Hyperlipidemia, unspecified: Secondary | ICD-10-CM

## 2016-03-17 DIAGNOSIS — K219 Gastro-esophageal reflux disease without esophagitis: Secondary | ICD-10-CM

## 2016-03-17 DIAGNOSIS — I110 Hypertensive heart disease with heart failure: Secondary | ICD-10-CM | POA: Diagnosis not present

## 2016-03-17 DIAGNOSIS — I6320 Cerebral infarction due to unspecified occlusion or stenosis of unspecified precerebral arteries: Secondary | ICD-10-CM

## 2016-03-17 DIAGNOSIS — J45909 Unspecified asthma, uncomplicated: Secondary | ICD-10-CM

## 2016-03-17 LAB — BAYER DCA HB A1C WAIVED: HB A1C (BAYER DCA - WAIVED): 6.6 % (ref <7.0)

## 2016-03-17 MED ORDER — METFORMIN HCL 500 MG PO TABS: ORAL_TABLET | ORAL | 1 refills | Status: DC

## 2016-03-17 MED ORDER — ATORVASTATIN CALCIUM 80 MG PO TABS: 80.0000 mg | ORAL_TABLET | Freq: Every day | ORAL | 3 refills | Status: DC

## 2016-03-17 MED ORDER — OMEPRAZOLE 20 MG PO CPDR: 20.0000 mg | DELAYED_RELEASE_CAPSULE | Freq: Every day | ORAL | 1 refills | Status: DC

## 2016-03-17 MED ORDER — GLUCOSE BLOOD VI STRP: ORAL_STRIP | 1 refills | Status: DC

## 2016-03-17 MED ORDER — BUDESONIDE-FORMOTEROL FUMARATE 160-4.5 MCG/ACT IN AERO: 2.0000 | INHALATION_SPRAY | Freq: Two times a day (BID) | RESPIRATORY_TRACT | 5 refills | Status: DC

## 2016-03-17 NOTE — Progress Notes (Signed)
Subjective:    Patient ID: [name], male    DOB: November 14, 1932, 82 y.o.   MRN: [id number]  Patient here today for follow up of chronic medical problems.  Outpatient Encounter Prescriptions as of 11/20/2015  Medication Sig  . aspirin 81 MG tablet Take 81 mg by mouth daily.  . budesonide-formoterol (SYMBICORT) 160-4.5 MCG/ACT inhaler Inhale 2 puffs into the lungs 2 (two) times daily.  . fenofibrate 160 MG tablet Take 1 tablet (160 mg total) by mouth daily.  [name] glucose blood (BAYER CONTOUR TEST) test strip Test 1 - 2 times a day. Dx E11.49  . meclizine (ANTIVERT) 12.5 MG tablet Take 1 tablet (12.5 mg total) by mouth 3 (three) times daily as needed for dizziness.  . metFORMIN (GLUCOPHAGE) 500 MG tablet TAKE 1 TABLET (500 MG TOTAL) BY MOUTH 2 (TWO) TIMES DAILY WITH A MEAL.  . naproxen sodium (ANAPROX) 220 MG tablet Take 220 mg by mouth at bedtime as needed.    * C/o feet and legs hurting- feels like a burning sensation- painful to walk on.  Diabetes  He presents for his follow-up diabetic visit. He has type 2 diabetes mellitus. No MedicAlert identification noted. His disease course has been improving. There are no hypoglycemic associated symptoms. Pertinent negatives for diabetes include no visual change. Diabetic complications include a CVA and retinopathy. Risk factors for coronary artery disease include diabetes mellitus, dyslipidemia, family history, male sex, obesity and sedentary lifestyle. Current diabetic treatment includes oral agent (monotherapy). He is compliant with treatment some of the time. His weight is stable. When asked about meal planning, he reported none. He has not had a previous visit with a dietitian. He never participates in exercise. His breakfast blood glucose is taken between 8-9 am. His breakfast blood glucose range is generally 140-180 mg/dl. His highest blood glucose is >200 mg/dl. His overall blood glucose range is 140-180 mg/dl. An ACE inhibitor/angiotensin II receptor  blocker is not being taken. He does not see a podiatrist.Eye exam is not current.  Hyperlipidemia  This is a chronic problem. The current episode started more than 1 year ago. The problem is uncontrolled. Recent lipid tests were reviewed and are variable. He is currently on no antihyperlipidemic treatment. The current treatment provides moderate improvement of lipids. Compliance problems include adherence to diet and adherence to exercise.  Risk factors for coronary artery disease include diabetes mellitus, dyslipidemia, hypertension, male sex and obesity.  Heartburn  He complains of heartburn. This is a recurrent problem. The current episode started more than 1 year ago. The problem occurs occasionally. The problem has been gradually worsening. Nothing aggravates the symptoms. He has tried nothing for the symptoms.  Hypertension  This is a chronic problem. The current episode started more than 1 year ago. The problem is unchanged. The problem is uncontrolled. Pertinent negatives include no palpitations. Risk factors for coronary artery disease include diabetes mellitus, dyslipidemia, male gender, obesity and sedentary lifestyle. Past treatments include nothing. Hypertensive end-organ damage includes CAD/MI, CVA, heart failure and retinopathy.  COPD symbicort helps- has some sob with exertion.Does not see pulmonologist. thrombocytopenia  Has not had blood checked in awhile.  Hx of CVA, AAA and CAD-  sees cardiologist every 6 months . No c/o chest pain or SOB OSA Suppose to use CPAP nightly but did not like so he took machine back and said he is not going to use it. Hx prostate/mixed incontinence  PSA was elevated again at last visit so we sent him to urologist.  Had bx 2 weeks ago and has follow up August 2nd to discuss results.   Review of Systems  Constitutional: Negative.   HENT: Negative.   Cardiovascular: Negative for palpitations.  Gastrointestinal: Positive for heartburn.   Genitourinary: Negative.   Musculoskeletal: Positive for gait problem.       Uses a cane.   Neurological: Negative for numbness.  Psychiatric/Behavioral: Negative.   All other systems reviewed and are negative.      Objective:   Physical Exam  Constitutional: He is oriented to person, place, and time. He appears well-developed and well-nourished.  HENT:  Head: Normocephalic.  Right Ear: External ear normal.  Left Ear: External ear normal.  Eyes: Pupils are equal, round, and reactive to light.  Neck: Normal range of motion. Neck supple.  Cardiovascular: Normal rate, regular rhythm, normal heart sounds and intact distal pulses.   Pulmonary/Chest: Effort normal and breath sounds normal.  Abdominal: Soft. Bowel sounds are normal. There is no tenderness.  Musculoskeletal: Normal range of motion. He exhibits no edema.  Neurological: He is alert and oriented to person, place, and time.  Skin: Skin is warm and dry.  Psychiatric: He has a normal mood and affect. His behavior is normal. Judgment and thought content normal.     HGBA1c- 6.6% up from 5.8%     Assessment & Plan:  1. Type 2 diabetes mellitus with diabetic polyneuropathy, without long-term current use of insulin (HCC) Stricter carb counting so A1c des not get any higher - Bayer DCA Hb A1c Waived - Microalbumin / creatinine urine ratio - metFORMIN (GLUCOPHAGE) 500 MG tablet; TAKE 1 TABLET (500 MG TOTAL) BY MOUTH 2 (TWO) TIMES DAILY WITH A MEAL.  Dispense: 180 tablet; Refill: 1 - glucose blood (BAYER CONTOUR TEST) test strip; Test 1 - 2 times a day. Dx E11.49  Dispense: 300 each; Refill: 1  2. Hyperlipidemia with target LDL less than 100 Low fat diet - Lipid panel - atorvastatin (LIPITOR) 80 MG tablet; Take 1 tablet (80 mg total) by mouth daily.  Dispense: 90 tablet; Refill: 3  3. Hypertensive heart disease with heart failure (HCC) Do not add salt to diet - CMP14+EGFR  4. Cerebrovascular accident (CVA) due to stenosis  of precerebral artery ([place])  5. Coronary artery disease involving other coronary artery bypass graft  6. OSA (obstructive sleep apnea) Refuses to use CPAP  7. COPD with asthma ([place]) - budesonide-formoterol (SYMBICORT) 160-4.5 MCG/ACT inhaler; Inhale 2 puffs into the lungs 2 (two) times daily.  Dispense: 1 Inhaler; Refill: 5  8. Prostate cancer [facility]) Keep follow up appointment with urology  9. Thrombocytopenia ([place]) Will check labs today  10. BMI 29.0-29.9,adult Discussed diet and exercise for person with BMI >25 Will recheck weight in 3-6 months  11. Mixed incontinence  12. Gastroesophageal reflux disease without esophagitis Avoid spicy foods Do not eat 2 hours prior to bedtime - omeprazole (PRILOSEC) 20 MG capsule; Take 1 capsule (20 mg total) by mouth daily.  Dispense: 90 capsule; Refill: 1    Labs pending Health maintenance reviewed Diet and exercise encouraged Continue all meds Follow up  In 3 month   [place], [name]

## 2016-03-17 NOTE — Patient Instructions (Signed)

## 2016-03-18 LAB — CMP14+EGFR
A/G RATIO: 1.6 (ref 1.2–2.2)
ALT: 18 IU/L (ref 0–44)
AST: 17 IU/L (ref 0–40)
Albumin: 3.7 g/dL (ref 3.5–4.7)
Alkaline Phosphatase: 75 IU/L (ref 39–117)
BUN/Creatinine Ratio: 14 (ref 10–24)
BUN: 13 mg/dL (ref 8–27)
Bilirubin Total: 1.1 mg/dL (ref 0.0–1.2)
CO2: 27 mmol/L (ref 18–29)
CREATININE: 0.92 mg/dL (ref 0.76–1.27)
Calcium: 9.4 mg/dL (ref 8.6–10.2)
Chloride: 103 mmol/L (ref 96–106)
GFR, EST AFRICAN AMERICAN: 90 mL/min/{1.73_m2} (ref >59)
GFR, EST NON AFRICAN AMERICAN: 78 mL/min/{1.73_m2} (ref >59)
GLUCOSE: 115 mg/dL — AB (ref 65–99)
Globulin, Total: 2.3 g/dL (ref 1.5–4.5)
POTASSIUM: 4.3 mmol/L (ref 3.5–5.2)
Sodium: 144 mmol/L (ref 134–144)
TOTAL PROTEIN: 6 g/dL (ref 6.0–8.5)

## 2016-03-18 LAB — MICROALBUMIN / CREATININE URINE RATIO
Creatinine, Urine: 123.5 mg/dL
MICROALB/CREAT RATIO: 8.4 mg/g{creat} (ref 0.0–30.0)
Microalbumin, Urine: 10.4 ug/mL

## 2016-03-18 LAB — LIPID PANEL
CHOL/HDL RATIO: 2.4 ratio (ref 0.0–5.0)
Cholesterol, Total: 85 mg/dL — ABNORMAL LOW (ref 100–199)
HDL: 35 mg/dL — AB (ref >39)
LDL CALC: 30 mg/dL (ref 0–99)
TRIGLYCERIDES: 99 mg/dL (ref 0–149)
VLDL CHOLESTEROL CAL: 20 mg/dL (ref 5–40)

## 2016-03-26 ENCOUNTER — Ambulatory Visit (INDEPENDENT_AMBULATORY_CARE_PROVIDER_SITE_OTHER): Payer: Commercial Managed Care - HMO | Admitting: Urology

## 2016-03-26 ENCOUNTER — Ambulatory Visit: Payer: Commercial Managed Care - HMO | Admitting: Urology

## 2016-03-26 DIAGNOSIS — R3915 Urgency of urination: Secondary | ICD-10-CM

## 2016-03-26 DIAGNOSIS — R351 Nocturia: Secondary | ICD-10-CM

## 2016-03-26 DIAGNOSIS — N401 Enlarged prostate with lower urinary tract symptoms: Secondary | ICD-10-CM | POA: Diagnosis not present

## 2016-03-26 DIAGNOSIS — N3941 Urge incontinence: Secondary | ICD-10-CM | POA: Diagnosis not present

## 2016-05-19 ENCOUNTER — Ambulatory Visit (INDEPENDENT_AMBULATORY_CARE_PROVIDER_SITE_OTHER): Payer: Commercial Managed Care - HMO

## 2016-05-19 ENCOUNTER — Ambulatory Visit (INDEPENDENT_AMBULATORY_CARE_PROVIDER_SITE_OTHER): Payer: Commercial Managed Care - HMO | Admitting: Nurse Practitioner

## 2016-05-19 ENCOUNTER — Encounter: Payer: Self-pay | Admitting: Nurse Practitioner

## 2016-05-19 VITALS — BP 122/74 | HR 78 | Temp 97.1°F | Ht 71.0 in | Wt 211.0 lb

## 2016-05-19 DIAGNOSIS — M542 Cervicalgia: Secondary | ICD-10-CM | POA: Diagnosis not present

## 2016-05-19 DIAGNOSIS — Z23 Encounter for immunization: Secondary | ICD-10-CM | POA: Diagnosis not present

## 2016-05-19 MED ORDER — MELOXICAM 15 MG PO TABS: 15.0000 mg | ORAL_TABLET | Freq: Every day | ORAL | 0 refills | Status: DC

## 2016-05-19 MED ORDER — CYCLOBENZAPRINE HCL 5 MG PO TABS: 5.0000 mg | ORAL_TABLET | Freq: Three times a day (TID) | ORAL | 0 refills | Status: DC | PRN

## 2016-05-19 NOTE — Progress Notes (Signed)
   Subjective:    Patient ID: [name], male    DOB: [birth year], 82 y.o.   MRN: [id number]  Neck Pain   This is a new problem. The current episode started yesterday. The problem occurs intermittently. The problem has been unchanged. The pain is associated with an unknown factor. Pain location: Posterior neck. The quality of the pain is described as shooting. The pain is at a severity of 10/10 (with movement or change in position   ). The pain is severe. The symptoms are aggravated by twisting and bending. The pain is same all the time. Pertinent negatives include no chest pain, headaches, leg pain (right leg), pain with swallowing, trouble swallowing or visual change. He has tried heat, NSAIDs and oral narcotics for the symptoms. The treatment provided mild relief.      Review of Systems  HENT: Negative for trouble swallowing.   Cardiovascular: Negative for chest pain.  Musculoskeletal: Positive for neck pain.  Neurological: Negative for headaches.       Objective:   Physical Exam  Constitutional: He is oriented to person, place, and time. He appears well-developed and well-nourished.  Neck: Neck supple. No tracheal deviation present. No thyromegaly present.  Unable to perform any active range of motion on the cervical spine. Restricted passive range of motions elicited- pain to move.  Neurological: He is alert and oriented to person, place, and time.   BP 122/74   Pulse 78   Temp 97.1 F (36.2 C) (Oral)   Ht 5\' 11"  (1.803 m)   Wt 211 lb (95.7 kg)   BMI 29.43 kg/m    DG Cervical Spine complete- mild degenerative changes-otherwise normal- Preliminary reading by Ronnald Collum, [name]  [facility]      Assessment & Plan:  1.Neck pain Moist heat, ICY hot or [place] as needed - DG Cervical Spine Complete; Future - meloxicam (MOBIC) 15 MG tablet; Take 1 tablet (15 mg total) by mouth daily.  Dispense: 30 tablet; Refill: 0 - cyclobenzaprine (FLEXERIL) 5 MG tablet; Take 1 tablet (5 mg  total) by mouth 3 (three) times daily as needed for muscle spasms.  Dispense: 30 tablet; Refill: 0  [name] [name], [name]

## 2016-06-30 ENCOUNTER — Other Ambulatory Visit: Payer: Self-pay | Admitting: Dermatology

## 2016-06-30 DIAGNOSIS — L918 Other hypertrophic disorders of the skin: Secondary | ICD-10-CM | POA: Diagnosis not present

## 2016-06-30 DIAGNOSIS — L570 Actinic keratosis: Secondary | ICD-10-CM | POA: Diagnosis not present

## 2016-06-30 DIAGNOSIS — D485 Neoplasm of uncertain behavior of skin: Secondary | ICD-10-CM | POA: Diagnosis not present

## 2016-07-02 ENCOUNTER — Other Ambulatory Visit: Payer: Self-pay

## 2016-07-02 ENCOUNTER — Encounter: Payer: Self-pay | Admitting: Nurse Practitioner

## 2016-07-02 ENCOUNTER — Ambulatory Visit (INDEPENDENT_AMBULATORY_CARE_PROVIDER_SITE_OTHER): Payer: Commercial Managed Care - HMO | Admitting: Nurse Practitioner

## 2016-07-02 ENCOUNTER — Telehealth: Payer: Self-pay | Admitting: Nurse Practitioner

## 2016-07-02 VITALS — BP 126/64 | HR 76 | Temp 96.9°F | Ht 71.0 in | Wt 215.0 lb

## 2016-07-02 DIAGNOSIS — E785 Hyperlipidemia, unspecified: Secondary | ICD-10-CM | POA: Diagnosis not present

## 2016-07-02 DIAGNOSIS — K219 Gastro-esophageal reflux disease without esophagitis: Secondary | ICD-10-CM

## 2016-07-02 DIAGNOSIS — J449 Chronic obstructive pulmonary disease, unspecified: Secondary | ICD-10-CM

## 2016-07-02 DIAGNOSIS — G4733 Obstructive sleep apnea (adult) (pediatric): Secondary | ICD-10-CM

## 2016-07-02 DIAGNOSIS — C61 Malignant neoplasm of prostate: Secondary | ICD-10-CM | POA: Diagnosis not present

## 2016-07-02 DIAGNOSIS — E1142 Type 2 diabetes mellitus with diabetic polyneuropathy: Secondary | ICD-10-CM | POA: Diagnosis not present

## 2016-07-02 DIAGNOSIS — N3946 Mixed incontinence: Secondary | ICD-10-CM

## 2016-07-02 DIAGNOSIS — I25810 Atherosclerosis of coronary artery bypass graft(s) without angina pectoris: Secondary | ICD-10-CM

## 2016-07-02 DIAGNOSIS — Z8042 Family history of malignant neoplasm of prostate: Secondary | ICD-10-CM

## 2016-07-02 DIAGNOSIS — Z6829 Body mass index (BMI) 29.0-29.9, adult: Secondary | ICD-10-CM

## 2016-07-02 LAB — BAYER DCA HB A1C WAIVED: HB A1C: 6.4 % (ref <7.0)

## 2016-07-02 MED ORDER — BUDESONIDE-FORMOTEROL FUMARATE 160-4.5 MCG/ACT IN AERO: 2.0000 | INHALATION_SPRAY | Freq: Two times a day (BID) | RESPIRATORY_TRACT | 5 refills | Status: DC

## 2016-07-02 MED ORDER — BUDESONIDE-FORMOTEROL FUMARATE 160-4.5 MCG/ACT IN AERO
2.0000 | INHALATION_SPRAY | Freq: Two times a day (BID) | RESPIRATORY_TRACT | 5 refills | Status: DC
Start: 2016-07-02 — End: 2017-01-02

## 2016-07-02 MED ORDER — OMEPRAZOLE 20 MG PO CPDR: 20.0000 mg | DELAYED_RELEASE_CAPSULE | Freq: Every day | ORAL | 1 refills | Status: DC

## 2016-07-02 MED ORDER — ATORVASTATIN CALCIUM 80 MG PO TABS: 80.0000 mg | ORAL_TABLET | Freq: Every day | ORAL | 3 refills | Status: DC

## 2016-07-02 MED ORDER — METFORMIN HCL 500 MG PO TABS: ORAL_TABLET | ORAL | 1 refills | Status: DC

## 2016-07-02 NOTE — Patient Instructions (Signed)

## 2016-07-02 NOTE — Telephone Encounter (Signed)
Please change pharm and resend

## 2016-07-02 NOTE — Progress Notes (Signed)
Subjective:    Patient ID: [name], male    DOB: [birth year], 82 y.o.   MRN: [id number]  Patient here today for follow up of chronic medical problems. No changes since last visit. No complaints.  Outpatient Encounter Prescriptions as of 11/20/2015  Medication Sig  . aspirin 81 MG tablet Take 81 mg by mouth daily.  . budesonide-formoterol (SYMBICORT) 160-4.5 MCG/ACT inhaler Inhale 2 puffs into the lungs 2 (two) times daily.  . fenofibrate 160 MG tablet Take 1 tablet (160 mg total) by mouth daily.  [name] glucose blood (BAYER CONTOUR TEST) test strip Test 1 - 2 times a day. Dx E11.49  . meclizine (ANTIVERT) 12.5 MG tablet Take 1 tablet (12.5 mg total) by mouth 3 (three) times daily as needed for dizziness.  . metFORMIN (GLUCOPHAGE) 500 MG tablet TAKE 1 TABLET (500 MG TOTAL) BY MOUTH 2 (TWO) TIMES DAILY WITH A MEAL.  . naproxen sodium (ANAPROX) 220 MG tablet Take 220 mg by mouth at bedtime as needed.      Diabetes  He presents for his follow-up diabetic visit. He has type 2 diabetes mellitus. No MedicAlert identification noted. His disease course has been improving. There are no hypoglycemic associated symptoms. Pertinent negatives for diabetes include no visual change. Diabetic complications include a CVA and retinopathy. Risk factors for coronary artery disease include diabetes mellitus, dyslipidemia, family history, male sex, obesity and sedentary lifestyle. Current diabetic treatment includes oral agent (monotherapy). He is compliant with treatment some of the time. His weight is stable. When asked about meal planning, he reported none. He has not had a previous visit with a dietitian. He never participates in exercise. His breakfast blood glucose is taken between 8-9 am. His breakfast blood glucose range is generally 140-180 mg/dl. His highest blood glucose is >200 mg/dl. His overall blood glucose range is 140-180 mg/dl. An ACE inhibitor/angiotensin II receptor blocker is not being taken. He does  not see a podiatrist.Eye exam is not current.  Hyperlipidemia  This is a chronic problem. The current episode started more than 1 year ago. The problem is uncontrolled. Recent lipid tests were reviewed and are variable. He is currently on no antihyperlipidemic treatment. The current treatment provides moderate improvement of lipids. Compliance problems include adherence to diet and adherence to exercise.  Risk factors for coronary artery disease include diabetes mellitus, dyslipidemia, hypertension, male sex and obesity.  Heartburn  He complains of heartburn. This is a recurrent problem. The current episode started more than 1 year ago. The problem occurs occasionally. The problem has been gradually worsening. Nothing aggravates the symptoms. He has tried nothing for the symptoms.  Hypertension  This is a chronic problem. The current episode started more than 1 year ago. The problem is unchanged. The problem is uncontrolled. Pertinent negatives include no palpitations. Risk factors for coronary artery disease include diabetes mellitus, dyslipidemia, male gender, obesity and sedentary lifestyle. Past treatments include nothing. Hypertensive end-organ damage includes CAD/MI, CVA, heart failure and retinopathy.  COPD symbicort helps- has some sob with exertion.Does not see pulmonologist. thrombocytopenia  Has not had blood checked in awhile.  Hx of CVA, AAA and CAD-  sees cardiologist every 6 months . No c/o chest pain or SOB OSA Suppose to use CPAP nightly but did not like so he took machine back and said he is not going to use it. Hx prostate/mixed incontinence  PSA was elevated again at last visit so we sent him to urologist. Had bx 2 weeks ago and has  follow up August 2nd to discuss results.   Review of Systems  Constitutional: Negative.   HENT: Negative.   Cardiovascular: Negative for palpitations.  Gastrointestinal: Positive for heartburn.  Genitourinary: Negative.   Musculoskeletal:  Positive for gait problem.       Uses a cane.   Neurological: Negative for numbness.  Psychiatric/Behavioral: Negative.   All other systems reviewed and are negative.      Objective:   Physical Exam  Constitutional: He is oriented to person, place, and time. He appears well-developed and well-nourished.  HENT:  Head: Normocephalic.  Right Ear: External ear normal.  Left Ear: External ear normal.  Eyes: Pupils are equal, round, and reactive to light.  Neck: Normal range of motion. Neck supple.  Cardiovascular: Normal rate, regular rhythm, normal heart sounds and intact distal pulses.   Pulmonary/Chest: Effort normal and breath sounds normal.  Abdominal: Soft. Bowel sounds are normal. There is no tenderness.  Musculoskeletal: Normal range of motion. He exhibits no edema.  Neurological: He is alert and oriented to person, place, and time.  Skin: Skin is warm and dry.  Psychiatric: He has a normal mood and affect. His behavior is normal. Judgment and thought content normal.   BP 126/64   Pulse 76   Temp (!) 96.9 F (36.1 C) (Oral)   Ht '5\' 11"'$  (1.803 m)   Wt 215 lb (97.5 kg)   SpO2 98%   BMI 29.99 kg/m    HGBA1c- 6.4%     Assessment & Plan:  1. Type 2 diabetes mellitus with diabetic polyneuropathy, without long-term current use of insulin (HCC) Continue to watch carbs in diet - Bayer DCA Hb A1c Waived - CMP14+EGFR - metFORMIN (GLUCOPHAGE) 500 MG tablet; TAKE 1 TABLET (500 MG TOTAL) BY MOUTH 2 (TWO) TIMES DAILY WITH A MEAL.  Dispense: 180 tablet; Refill: 1  2. Hyperlipidemia with target LDL less than 100 Low fat diet - Lipid panel - atorvastatin (LIPITOR) 80 MG tablet; Take 1 tablet (80 mg total) by mouth daily.  Dispense: 90 tablet; Refill: 3  3. Prostate cancer ([place]) - PSA, total and free  4. Coronary artery disease involving other coronary artery bypass graft without angina pectoris  5. COPD with asthma ([name]) - budesonide-formoterol (SYMBICORT) 160-4.5 MCG/ACT  inhaler; Inhale 2 puffs into the lungs 2 (two) times daily.  Dispense: 1 Inhaler; Refill: 5  6. BMI 29.0-29.9,adult Discussed diet and exercise for person with BMI >25 Will recheck weight in 3-6 months  7. Mixed incontinence PSA drawn  8. OSA (obstructive sleep apnea) Patient refused CPAP machine  9. Gastroesophageal reflux disease without esophagitis Avoid spicy foods Do not eat 2 hours prior to bedtime - omeprazole (PRILOSEC) 20 MG capsule; Take 1 capsule (20 mg total) by mouth daily.  Dispense: 90 capsule; Refill: 1    Labs pending Health maintenance reviewed Diet and exercise encouraged Continue all meds Follow up  In 3 month   [place], [name]

## 2016-07-02 NOTE — Telephone Encounter (Signed)
Meds sent in to correct pharmacy

## 2016-07-03 LAB — CMP14+EGFR
A/G RATIO: 1.6 (ref 1.2–2.2)
ALBUMIN: 4.1 g/dL (ref 3.5–4.7)
ALK PHOS: 83 IU/L (ref 39–117)
ALT: 16 IU/L (ref 0–44)
AST: 14 IU/L (ref 0–40)
BILIRUBIN TOTAL: 1.1 mg/dL (ref 0.0–1.2)
BUN / CREAT RATIO: 16 (ref 10–24)
BUN: 12 mg/dL (ref 8–27)
CHLORIDE: 101 mmol/L (ref 96–106)
CO2: 28 mmol/L (ref 18–29)
CREATININE: 0.75 mg/dL — AB (ref 0.76–1.27)
Calcium: 9.4 mg/dL (ref 8.6–10.2)
GFR calc Af Amer: 99 mL/min/{1.73_m2} (ref >59)
GFR calc non Af Amer: 86 mL/min/{1.73_m2} (ref >59)
GLOBULIN, TOTAL: 2.5 g/dL (ref 1.5–4.5)
Glucose: 143 mg/dL — ABNORMAL HIGH (ref 65–99)
POTASSIUM: 4.1 mmol/L (ref 3.5–5.2)
SODIUM: 144 mmol/L (ref 134–144)
Total Protein: 6.6 g/dL (ref 6.0–8.5)

## 2016-07-03 LAB — LIPID PANEL
CHOLESTEROL TOTAL: 98 mg/dL — AB (ref 100–199)
Chol/HDL Ratio: 3.2 ratio units (ref 0.0–5.0)
HDL: 31 mg/dL — ABNORMAL LOW (ref >39)
LDL CALC: 41 mg/dL (ref 0–99)
Triglycerides: 129 mg/dL (ref 0–149)
VLDL Cholesterol Cal: 26 mg/dL (ref 5–40)

## 2016-07-03 LAB — PSA, TOTAL AND FREE
PSA FREE PCT: 9.4 %
PSA FREE: 0.3 ng/mL
Prostate Specific Ag, Serum: 3.2 ng/mL (ref 0.0–4.0)

## 2016-07-09 ENCOUNTER — Ambulatory Visit (INDEPENDENT_AMBULATORY_CARE_PROVIDER_SITE_OTHER): Payer: Commercial Managed Care - HMO | Admitting: Urology

## 2016-07-09 DIAGNOSIS — N401 Enlarged prostate with lower urinary tract symptoms: Secondary | ICD-10-CM | POA: Diagnosis not present

## 2016-07-09 DIAGNOSIS — C61 Malignant neoplasm of prostate: Secondary | ICD-10-CM | POA: Diagnosis not present

## 2016-07-09 DIAGNOSIS — N3941 Urge incontinence: Secondary | ICD-10-CM

## 2016-08-04 DIAGNOSIS — Z96652 Presence of left artificial knee joint: Secondary | ICD-10-CM | POA: Diagnosis not present

## 2016-08-04 DIAGNOSIS — E119 Type 2 diabetes mellitus without complications: Secondary | ICD-10-CM | POA: Diagnosis not present

## 2016-08-04 DIAGNOSIS — J449 Chronic obstructive pulmonary disease, unspecified: Secondary | ICD-10-CM | POA: Diagnosis not present

## 2016-08-04 DIAGNOSIS — M25561 Pain in right knee: Secondary | ICD-10-CM | POA: Diagnosis not present

## 2016-08-04 DIAGNOSIS — M79672 Pain in left foot: Secondary | ICD-10-CM | POA: Diagnosis not present

## 2016-08-04 DIAGNOSIS — M25572 Pain in left ankle and joints of left foot: Secondary | ICD-10-CM | POA: Diagnosis not present

## 2016-08-04 DIAGNOSIS — M7732 Calcaneal spur, left foot: Secondary | ICD-10-CM | POA: Diagnosis not present

## 2016-08-04 DIAGNOSIS — M25562 Pain in left knee: Secondary | ICD-10-CM | POA: Diagnosis not present

## 2016-08-04 DIAGNOSIS — Z87891 Personal history of nicotine dependence: Secondary | ICD-10-CM | POA: Diagnosis not present

## 2016-08-04 DIAGNOSIS — M898X7 Other specified disorders of bone, ankle and foot: Secondary | ICD-10-CM | POA: Diagnosis not present

## 2016-08-04 DIAGNOSIS — I252 Old myocardial infarction: Secondary | ICD-10-CM | POA: Diagnosis not present

## 2016-08-04 DIAGNOSIS — M1711 Unilateral primary osteoarthritis, right knee: Secondary | ICD-10-CM | POA: Diagnosis not present

## 2016-08-22 ENCOUNTER — Other Ambulatory Visit: Payer: Self-pay | Admitting: Nurse Practitioner

## 2016-08-22 DIAGNOSIS — E1142 Type 2 diabetes mellitus with diabetic polyneuropathy: Secondary | ICD-10-CM

## 2016-08-26 ENCOUNTER — Ambulatory Visit: Payer: Commercial Managed Care - HMO | Attending: Student | Admitting: Physical Therapy

## 2016-08-26 DIAGNOSIS — M25561 Pain in right knee: Secondary | ICD-10-CM | POA: Diagnosis not present

## 2016-08-26 DIAGNOSIS — G8929 Other chronic pain: Secondary | ICD-10-CM | POA: Diagnosis not present

## 2016-08-26 DIAGNOSIS — M6281 Muscle weakness (generalized): Secondary | ICD-10-CM | POA: Diagnosis not present

## 2016-08-26 NOTE — Patient Instructions (Signed)
Patient instructed in and performed (daughter present) SAQ's with 5# x 3 minutes; LAQ's with 5# x 3 minutes; Quad sets; SLR's and supine right hip abduction.
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
## 2016-08-28 DIAGNOSIS — Z951 Presence of aortocoronary bypass graft: Secondary | ICD-10-CM | POA: Diagnosis not present

## 2016-08-28 DIAGNOSIS — M1711 Unilateral primary osteoarthritis, right knee: Secondary | ICD-10-CM | POA: Diagnosis not present

## 2016-08-28 DIAGNOSIS — G4733 Obstructive sleep apnea (adult) (pediatric): Secondary | ICD-10-CM | POA: Diagnosis not present

## 2016-08-28 DIAGNOSIS — E119 Type 2 diabetes mellitus without complications: Secondary | ICD-10-CM | POA: Diagnosis not present

## 2016-08-28 DIAGNOSIS — Z01810 Encounter for preprocedural cardiovascular examination: Secondary | ICD-10-CM | POA: Diagnosis not present

## 2016-08-28 DIAGNOSIS — K219 Gastro-esophageal reflux disease without esophagitis: Secondary | ICD-10-CM | POA: Diagnosis not present

## 2016-08-28 DIAGNOSIS — I2510 Atherosclerotic heart disease of native coronary artery without angina pectoris: Secondary | ICD-10-CM | POA: Diagnosis not present

## 2016-08-28 DIAGNOSIS — D696 Thrombocytopenia, unspecified: Secondary | ICD-10-CM | POA: Diagnosis not present

## 2016-08-28 DIAGNOSIS — J449 Chronic obstructive pulmonary disease, unspecified: Secondary | ICD-10-CM | POA: Diagnosis not present

## 2016-08-28 DIAGNOSIS — E785 Hyperlipidemia, unspecified: Secondary | ICD-10-CM | POA: Diagnosis not present

## 2016-09-12 DIAGNOSIS — G4733 Obstructive sleep apnea (adult) (pediatric): Secondary | ICD-10-CM | POA: Diagnosis not present

## 2016-09-12 DIAGNOSIS — Z01810 Encounter for preprocedural cardiovascular examination: Secondary | ICD-10-CM | POA: Diagnosis not present

## 2016-09-12 DIAGNOSIS — J449 Chronic obstructive pulmonary disease, unspecified: Secondary | ICD-10-CM | POA: Diagnosis not present

## 2016-09-12 DIAGNOSIS — Z951 Presence of aortocoronary bypass graft: Secondary | ICD-10-CM | POA: Diagnosis not present

## 2016-09-12 DIAGNOSIS — E119 Type 2 diabetes mellitus without complications: Secondary | ICD-10-CM | POA: Diagnosis not present

## 2016-09-12 DIAGNOSIS — K219 Gastro-esophageal reflux disease without esophagitis: Secondary | ICD-10-CM | POA: Diagnosis not present

## 2016-09-12 DIAGNOSIS — Z01818 Encounter for other preprocedural examination: Secondary | ICD-10-CM | POA: Diagnosis not present

## 2016-09-23 DIAGNOSIS — M898X5 Other specified disorders of bone, thigh: Secondary | ICD-10-CM | POA: Diagnosis not present

## 2016-09-23 DIAGNOSIS — I2510 Atherosclerotic heart disease of native coronary artery without angina pectoris: Secondary | ICD-10-CM | POA: Diagnosis not present

## 2016-09-23 DIAGNOSIS — G8929 Other chronic pain: Secondary | ICD-10-CM | POA: Diagnosis not present

## 2016-09-23 DIAGNOSIS — M1711 Unilateral primary osteoarthritis, right knee: Secondary | ICD-10-CM | POA: Diagnosis not present

## 2016-09-23 DIAGNOSIS — G8918 Other acute postprocedural pain: Secondary | ICD-10-CM | POA: Diagnosis not present

## 2016-09-23 DIAGNOSIS — J449 Chronic obstructive pulmonary disease, unspecified: Secondary | ICD-10-CM | POA: Diagnosis not present

## 2016-09-23 DIAGNOSIS — Z96651 Presence of right artificial knee joint: Secondary | ICD-10-CM | POA: Diagnosis not present

## 2016-09-23 DIAGNOSIS — G4733 Obstructive sleep apnea (adult) (pediatric): Secondary | ICD-10-CM | POA: Diagnosis not present

## 2016-09-23 DIAGNOSIS — Z951 Presence of aortocoronary bypass graft: Secondary | ICD-10-CM | POA: Diagnosis not present

## 2016-09-23 DIAGNOSIS — K219 Gastro-esophageal reflux disease without esophagitis: Secondary | ICD-10-CM | POA: Diagnosis not present

## 2016-09-23 DIAGNOSIS — M25561 Pain in right knee: Secondary | ICD-10-CM | POA: Diagnosis not present

## 2016-09-23 DIAGNOSIS — E785 Hyperlipidemia, unspecified: Secondary | ICD-10-CM | POA: Diagnosis not present

## 2016-09-23 DIAGNOSIS — D696 Thrombocytopenia, unspecified: Secondary | ICD-10-CM | POA: Diagnosis not present

## 2016-09-23 DIAGNOSIS — E119 Type 2 diabetes mellitus without complications: Secondary | ICD-10-CM | POA: Diagnosis not present

## 2016-09-26 ENCOUNTER — Ambulatory Visit: Payer: Commercial Managed Care - HMO | Admitting: Physical Therapy

## 2016-09-29 DIAGNOSIS — Z5320 Procedure and treatment not carried out because of patient's decision for unspecified reasons: Secondary | ICD-10-CM | POA: Diagnosis not present

## 2016-09-29 DIAGNOSIS — M25561 Pain in right knee: Secondary | ICD-10-CM | POA: Diagnosis not present

## 2016-09-30 ENCOUNTER — Ambulatory Visit: Payer: Medicare HMO | Admitting: Physical Therapy

## 2016-09-30 DIAGNOSIS — M79661 Pain in right lower leg: Secondary | ICD-10-CM | POA: Diagnosis not present

## 2016-10-02 ENCOUNTER — Ambulatory Visit: Payer: Medicare HMO | Admitting: Physical Therapy

## 2016-10-03 DIAGNOSIS — I82401 Acute embolism and thrombosis of unspecified deep veins of right lower extremity: Secondary | ICD-10-CM | POA: Diagnosis not present

## 2016-10-03 DIAGNOSIS — J449 Chronic obstructive pulmonary disease, unspecified: Secondary | ICD-10-CM | POA: Diagnosis not present

## 2016-10-03 DIAGNOSIS — Z951 Presence of aortocoronary bypass graft: Secondary | ICD-10-CM | POA: Diagnosis not present

## 2016-10-03 DIAGNOSIS — Z96651 Presence of right artificial knee joint: Secondary | ICD-10-CM | POA: Diagnosis not present

## 2016-10-03 DIAGNOSIS — Z8679 Personal history of other diseases of the circulatory system: Secondary | ICD-10-CM | POA: Insufficient documentation

## 2016-10-03 DIAGNOSIS — Z95828 Presence of other vascular implants and grafts: Secondary | ICD-10-CM | POA: Diagnosis not present

## 2016-10-03 DIAGNOSIS — E119 Type 2 diabetes mellitus without complications: Secondary | ICD-10-CM | POA: Diagnosis not present

## 2016-10-03 DIAGNOSIS — Z7901 Long term (current) use of anticoagulants: Secondary | ICD-10-CM | POA: Diagnosis not present

## 2016-10-06 DIAGNOSIS — I82401 Acute embolism and thrombosis of unspecified deep veins of right lower extremity: Secondary | ICD-10-CM | POA: Diagnosis not present

## 2016-10-06 DIAGNOSIS — J449 Chronic obstructive pulmonary disease, unspecified: Secondary | ICD-10-CM | POA: Diagnosis not present

## 2016-10-06 DIAGNOSIS — I252 Old myocardial infarction: Secondary | ICD-10-CM | POA: Diagnosis not present

## 2016-10-06 DIAGNOSIS — Z881 Allergy status to other antibiotic agents status: Secondary | ICD-10-CM | POA: Diagnosis not present

## 2016-10-06 DIAGNOSIS — Z471 Aftercare following joint replacement surgery: Secondary | ICD-10-CM | POA: Diagnosis not present

## 2016-10-06 DIAGNOSIS — E119 Type 2 diabetes mellitus without complications: Secondary | ICD-10-CM | POA: Diagnosis not present

## 2016-10-06 DIAGNOSIS — Z87891 Personal history of nicotine dependence: Secondary | ICD-10-CM | POA: Diagnosis not present

## 2016-10-06 DIAGNOSIS — Z96652 Presence of left artificial knee joint: Secondary | ICD-10-CM | POA: Diagnosis not present

## 2016-10-06 DIAGNOSIS — I2510 Atherosclerotic heart disease of native coronary artery without angina pectoris: Secondary | ICD-10-CM | POA: Diagnosis not present

## 2016-10-07 DIAGNOSIS — E119 Type 2 diabetes mellitus without complications: Secondary | ICD-10-CM | POA: Diagnosis not present

## 2016-10-07 DIAGNOSIS — Z471 Aftercare following joint replacement surgery: Secondary | ICD-10-CM | POA: Diagnosis not present

## 2016-10-07 DIAGNOSIS — Z96652 Presence of left artificial knee joint: Secondary | ICD-10-CM | POA: Diagnosis not present

## 2016-10-07 DIAGNOSIS — I82401 Acute embolism and thrombosis of unspecified deep veins of right lower extremity: Secondary | ICD-10-CM | POA: Diagnosis not present

## 2016-10-07 DIAGNOSIS — M1991 Primary osteoarthritis, unspecified site: Secondary | ICD-10-CM | POA: Diagnosis not present

## 2016-10-07 DIAGNOSIS — Z7951 Long term (current) use of inhaled steroids: Secondary | ICD-10-CM | POA: Diagnosis not present

## 2016-10-08 ENCOUNTER — Ambulatory Visit: Payer: Medicare HMO | Admitting: Physical Therapy

## 2016-10-09 DIAGNOSIS — Z7951 Long term (current) use of inhaled steroids: Secondary | ICD-10-CM | POA: Diagnosis not present

## 2016-10-09 DIAGNOSIS — M1991 Primary osteoarthritis, unspecified site: Secondary | ICD-10-CM | POA: Diagnosis not present

## 2016-10-09 DIAGNOSIS — Z471 Aftercare following joint replacement surgery: Secondary | ICD-10-CM | POA: Diagnosis not present

## 2016-10-09 DIAGNOSIS — E119 Type 2 diabetes mellitus without complications: Secondary | ICD-10-CM | POA: Diagnosis not present

## 2016-10-09 DIAGNOSIS — Z96652 Presence of left artificial knee joint: Secondary | ICD-10-CM | POA: Diagnosis not present

## 2016-10-09 DIAGNOSIS — I82401 Acute embolism and thrombosis of unspecified deep veins of right lower extremity: Secondary | ICD-10-CM | POA: Diagnosis not present

## 2016-10-10 ENCOUNTER — Ambulatory Visit: Payer: Medicare HMO | Admitting: Physical Therapy

## 2016-10-10 DIAGNOSIS — I82401 Acute embolism and thrombosis of unspecified deep veins of right lower extremity: Secondary | ICD-10-CM | POA: Diagnosis not present

## 2016-10-10 DIAGNOSIS — E119 Type 2 diabetes mellitus without complications: Secondary | ICD-10-CM | POA: Diagnosis not present

## 2016-10-10 DIAGNOSIS — Z471 Aftercare following joint replacement surgery: Secondary | ICD-10-CM | POA: Diagnosis not present

## 2016-10-10 DIAGNOSIS — M1991 Primary osteoarthritis, unspecified site: Secondary | ICD-10-CM | POA: Diagnosis not present

## 2016-10-10 DIAGNOSIS — Z7951 Long term (current) use of inhaled steroids: Secondary | ICD-10-CM | POA: Diagnosis not present

## 2016-10-10 DIAGNOSIS — Z96652 Presence of left artificial knee joint: Secondary | ICD-10-CM | POA: Diagnosis not present

## 2016-10-13 DIAGNOSIS — Z96652 Presence of left artificial knee joint: Secondary | ICD-10-CM | POA: Diagnosis not present

## 2016-10-13 DIAGNOSIS — Z471 Aftercare following joint replacement surgery: Secondary | ICD-10-CM | POA: Diagnosis not present

## 2016-10-13 DIAGNOSIS — E119 Type 2 diabetes mellitus without complications: Secondary | ICD-10-CM | POA: Diagnosis not present

## 2016-10-13 DIAGNOSIS — I82401 Acute embolism and thrombosis of unspecified deep veins of right lower extremity: Secondary | ICD-10-CM | POA: Diagnosis not present

## 2016-10-13 DIAGNOSIS — M1991 Primary osteoarthritis, unspecified site: Secondary | ICD-10-CM | POA: Diagnosis not present

## 2016-10-13 DIAGNOSIS — Z7951 Long term (current) use of inhaled steroids: Secondary | ICD-10-CM | POA: Diagnosis not present

## 2016-10-15 DIAGNOSIS — I82401 Acute embolism and thrombosis of unspecified deep veins of right lower extremity: Secondary | ICD-10-CM | POA: Diagnosis not present

## 2016-10-15 DIAGNOSIS — Z7951 Long term (current) use of inhaled steroids: Secondary | ICD-10-CM | POA: Diagnosis not present

## 2016-10-15 DIAGNOSIS — M1991 Primary osteoarthritis, unspecified site: Secondary | ICD-10-CM | POA: Diagnosis not present

## 2016-10-15 DIAGNOSIS — Z471 Aftercare following joint replacement surgery: Secondary | ICD-10-CM | POA: Diagnosis not present

## 2016-10-15 DIAGNOSIS — Z96652 Presence of left artificial knee joint: Secondary | ICD-10-CM | POA: Diagnosis not present

## 2016-10-15 DIAGNOSIS — E119 Type 2 diabetes mellitus without complications: Secondary | ICD-10-CM | POA: Diagnosis not present

## 2016-10-17 DIAGNOSIS — M1710 Unilateral primary osteoarthritis, unspecified knee: Secondary | ICD-10-CM | POA: Diagnosis not present

## 2016-10-17 DIAGNOSIS — E119 Type 2 diabetes mellitus without complications: Secondary | ICD-10-CM | POA: Diagnosis not present

## 2016-10-17 DIAGNOSIS — Z96652 Presence of left artificial knee joint: Secondary | ICD-10-CM | POA: Diagnosis not present

## 2016-10-17 DIAGNOSIS — I82401 Acute embolism and thrombosis of unspecified deep veins of right lower extremity: Secondary | ICD-10-CM | POA: Diagnosis not present

## 2016-10-17 DIAGNOSIS — I259 Chronic ischemic heart disease, unspecified: Secondary | ICD-10-CM | POA: Diagnosis not present

## 2016-10-17 DIAGNOSIS — Z7951 Long term (current) use of inhaled steroids: Secondary | ICD-10-CM | POA: Diagnosis not present

## 2016-10-17 DIAGNOSIS — M1991 Primary osteoarthritis, unspecified site: Secondary | ICD-10-CM | POA: Diagnosis not present

## 2016-10-17 DIAGNOSIS — Z471 Aftercare following joint replacement surgery: Secondary | ICD-10-CM | POA: Diagnosis not present

## 2016-10-20 DIAGNOSIS — Z471 Aftercare following joint replacement surgery: Secondary | ICD-10-CM | POA: Diagnosis not present

## 2016-10-20 DIAGNOSIS — E119 Type 2 diabetes mellitus without complications: Secondary | ICD-10-CM | POA: Diagnosis not present

## 2016-10-20 DIAGNOSIS — I82401 Acute embolism and thrombosis of unspecified deep veins of right lower extremity: Secondary | ICD-10-CM | POA: Diagnosis not present

## 2016-10-20 DIAGNOSIS — Z96652 Presence of left artificial knee joint: Secondary | ICD-10-CM | POA: Diagnosis not present

## 2016-10-20 DIAGNOSIS — Z7951 Long term (current) use of inhaled steroids: Secondary | ICD-10-CM | POA: Diagnosis not present

## 2016-10-20 DIAGNOSIS — M1991 Primary osteoarthritis, unspecified site: Secondary | ICD-10-CM | POA: Diagnosis not present

## 2016-10-22 DIAGNOSIS — Z96652 Presence of left artificial knee joint: Secondary | ICD-10-CM | POA: Diagnosis not present

## 2016-10-22 DIAGNOSIS — Z471 Aftercare following joint replacement surgery: Secondary | ICD-10-CM | POA: Diagnosis not present

## 2016-10-22 DIAGNOSIS — I82401 Acute embolism and thrombosis of unspecified deep veins of right lower extremity: Secondary | ICD-10-CM | POA: Diagnosis not present

## 2016-10-22 DIAGNOSIS — Z7951 Long term (current) use of inhaled steroids: Secondary | ICD-10-CM | POA: Diagnosis not present

## 2016-10-22 DIAGNOSIS — M1991 Primary osteoarthritis, unspecified site: Secondary | ICD-10-CM | POA: Diagnosis not present

## 2016-10-22 DIAGNOSIS — E119 Type 2 diabetes mellitus without complications: Secondary | ICD-10-CM | POA: Diagnosis not present

## 2016-10-24 DIAGNOSIS — E119 Type 2 diabetes mellitus without complications: Secondary | ICD-10-CM | POA: Diagnosis not present

## 2016-10-24 DIAGNOSIS — Z471 Aftercare following joint replacement surgery: Secondary | ICD-10-CM | POA: Diagnosis not present

## 2016-10-24 DIAGNOSIS — M1991 Primary osteoarthritis, unspecified site: Secondary | ICD-10-CM | POA: Diagnosis not present

## 2016-10-24 DIAGNOSIS — Z96652 Presence of left artificial knee joint: Secondary | ICD-10-CM | POA: Diagnosis not present

## 2016-10-24 DIAGNOSIS — Z7951 Long term (current) use of inhaled steroids: Secondary | ICD-10-CM | POA: Diagnosis not present

## 2016-10-24 DIAGNOSIS — I82401 Acute embolism and thrombosis of unspecified deep veins of right lower extremity: Secondary | ICD-10-CM | POA: Diagnosis not present

## 2016-10-27 DIAGNOSIS — M1991 Primary osteoarthritis, unspecified site: Secondary | ICD-10-CM | POA: Diagnosis not present

## 2016-10-27 DIAGNOSIS — Z471 Aftercare following joint replacement surgery: Secondary | ICD-10-CM | POA: Diagnosis not present

## 2016-10-27 DIAGNOSIS — Z7951 Long term (current) use of inhaled steroids: Secondary | ICD-10-CM | POA: Diagnosis not present

## 2016-10-27 DIAGNOSIS — E119 Type 2 diabetes mellitus without complications: Secondary | ICD-10-CM | POA: Diagnosis not present

## 2016-10-27 DIAGNOSIS — I82401 Acute embolism and thrombosis of unspecified deep veins of right lower extremity: Secondary | ICD-10-CM | POA: Diagnosis not present

## 2016-10-27 DIAGNOSIS — Z96652 Presence of left artificial knee joint: Secondary | ICD-10-CM | POA: Diagnosis not present

## 2016-10-29 DIAGNOSIS — I82401 Acute embolism and thrombosis of unspecified deep veins of right lower extremity: Secondary | ICD-10-CM | POA: Diagnosis not present

## 2016-10-29 DIAGNOSIS — E119 Type 2 diabetes mellitus without complications: Secondary | ICD-10-CM | POA: Diagnosis not present

## 2016-10-29 DIAGNOSIS — M1991 Primary osteoarthritis, unspecified site: Secondary | ICD-10-CM | POA: Diagnosis not present

## 2016-10-29 DIAGNOSIS — Z7951 Long term (current) use of inhaled steroids: Secondary | ICD-10-CM | POA: Diagnosis not present

## 2016-10-29 DIAGNOSIS — Z96652 Presence of left artificial knee joint: Secondary | ICD-10-CM | POA: Diagnosis not present

## 2016-10-29 DIAGNOSIS — Z471 Aftercare following joint replacement surgery: Secondary | ICD-10-CM | POA: Diagnosis not present

## 2016-10-30 DIAGNOSIS — Z86718 Personal history of other venous thrombosis and embolism: Secondary | ICD-10-CM | POA: Diagnosis not present

## 2016-10-30 DIAGNOSIS — Z96651 Presence of right artificial knee joint: Secondary | ICD-10-CM | POA: Diagnosis not present

## 2016-10-30 DIAGNOSIS — Z7982 Long term (current) use of aspirin: Secondary | ICD-10-CM | POA: Diagnosis not present

## 2016-10-30 DIAGNOSIS — I82401 Acute embolism and thrombosis of unspecified deep veins of right lower extremity: Secondary | ICD-10-CM | POA: Diagnosis not present

## 2016-10-30 DIAGNOSIS — Z7901 Long term (current) use of anticoagulants: Secondary | ICD-10-CM | POA: Diagnosis not present

## 2016-10-30 DIAGNOSIS — I82491 Acute embolism and thrombosis of other specified deep vein of right lower extremity: Secondary | ICD-10-CM | POA: Diagnosis not present

## 2016-10-31 ENCOUNTER — Ambulatory Visit: Payer: Commercial Managed Care - HMO | Admitting: Nurse Practitioner

## 2016-10-31 DIAGNOSIS — Z471 Aftercare following joint replacement surgery: Secondary | ICD-10-CM | POA: Diagnosis not present

## 2016-10-31 DIAGNOSIS — Z96651 Presence of right artificial knee joint: Secondary | ICD-10-CM | POA: Diagnosis not present

## 2016-11-04 DIAGNOSIS — Z7951 Long term (current) use of inhaled steroids: Secondary | ICD-10-CM | POA: Diagnosis not present

## 2016-11-04 DIAGNOSIS — Z471 Aftercare following joint replacement surgery: Secondary | ICD-10-CM | POA: Diagnosis not present

## 2016-11-04 DIAGNOSIS — I82401 Acute embolism and thrombosis of unspecified deep veins of right lower extremity: Secondary | ICD-10-CM | POA: Diagnosis not present

## 2016-11-04 DIAGNOSIS — M1991 Primary osteoarthritis, unspecified site: Secondary | ICD-10-CM | POA: Diagnosis not present

## 2016-11-04 DIAGNOSIS — Z96652 Presence of left artificial knee joint: Secondary | ICD-10-CM | POA: Diagnosis not present

## 2016-11-04 DIAGNOSIS — E119 Type 2 diabetes mellitus without complications: Secondary | ICD-10-CM | POA: Diagnosis not present

## 2016-11-06 DIAGNOSIS — I82401 Acute embolism and thrombosis of unspecified deep veins of right lower extremity: Secondary | ICD-10-CM | POA: Diagnosis not present

## 2016-11-06 DIAGNOSIS — Z96652 Presence of left artificial knee joint: Secondary | ICD-10-CM | POA: Diagnosis not present

## 2016-11-06 DIAGNOSIS — Z7951 Long term (current) use of inhaled steroids: Secondary | ICD-10-CM | POA: Diagnosis not present

## 2016-11-06 DIAGNOSIS — M1991 Primary osteoarthritis, unspecified site: Secondary | ICD-10-CM | POA: Diagnosis not present

## 2016-11-06 DIAGNOSIS — E119 Type 2 diabetes mellitus without complications: Secondary | ICD-10-CM | POA: Diagnosis not present

## 2016-11-06 DIAGNOSIS — Z471 Aftercare following joint replacement surgery: Secondary | ICD-10-CM | POA: Diagnosis not present

## 2016-11-10 DIAGNOSIS — G4733 Obstructive sleep apnea (adult) (pediatric): Secondary | ICD-10-CM | POA: Diagnosis not present

## 2016-11-10 DIAGNOSIS — Z880 Allergy status to penicillin: Secondary | ICD-10-CM | POA: Diagnosis not present

## 2016-11-10 DIAGNOSIS — Z96651 Presence of right artificial knee joint: Secondary | ICD-10-CM | POA: Diagnosis not present

## 2016-11-10 DIAGNOSIS — J449 Chronic obstructive pulmonary disease, unspecified: Secondary | ICD-10-CM | POA: Diagnosis not present

## 2016-11-10 DIAGNOSIS — Z888 Allergy status to other drugs, medicaments and biological substances status: Secondary | ICD-10-CM | POA: Diagnosis not present

## 2016-11-10 DIAGNOSIS — I252 Old myocardial infarction: Secondary | ICD-10-CM | POA: Diagnosis not present

## 2016-11-10 DIAGNOSIS — E119 Type 2 diabetes mellitus without complications: Secondary | ICD-10-CM | POA: Diagnosis not present

## 2016-11-10 DIAGNOSIS — Z881 Allergy status to other antibiotic agents status: Secondary | ICD-10-CM | POA: Diagnosis not present

## 2016-11-10 DIAGNOSIS — I2510 Atherosclerotic heart disease of native coronary artery without angina pectoris: Secondary | ICD-10-CM | POA: Diagnosis not present

## 2016-11-10 DIAGNOSIS — Z96652 Presence of left artificial knee joint: Secondary | ICD-10-CM | POA: Diagnosis not present

## 2016-11-10 DIAGNOSIS — Z471 Aftercare following joint replacement surgery: Secondary | ICD-10-CM | POA: Diagnosis not present

## 2016-11-12 DIAGNOSIS — Z96652 Presence of left artificial knee joint: Secondary | ICD-10-CM | POA: Diagnosis not present

## 2016-11-12 DIAGNOSIS — Z471 Aftercare following joint replacement surgery: Secondary | ICD-10-CM | POA: Diagnosis not present

## 2016-11-12 DIAGNOSIS — I82401 Acute embolism and thrombosis of unspecified deep veins of right lower extremity: Secondary | ICD-10-CM | POA: Diagnosis not present

## 2016-11-12 DIAGNOSIS — E119 Type 2 diabetes mellitus without complications: Secondary | ICD-10-CM | POA: Diagnosis not present

## 2016-11-12 DIAGNOSIS — M1991 Primary osteoarthritis, unspecified site: Secondary | ICD-10-CM | POA: Diagnosis not present

## 2016-11-12 DIAGNOSIS — Z7951 Long term (current) use of inhaled steroids: Secondary | ICD-10-CM | POA: Diagnosis not present

## 2016-11-14 DIAGNOSIS — I82401 Acute embolism and thrombosis of unspecified deep veins of right lower extremity: Secondary | ICD-10-CM | POA: Diagnosis not present

## 2016-11-14 DIAGNOSIS — Z7951 Long term (current) use of inhaled steroids: Secondary | ICD-10-CM | POA: Diagnosis not present

## 2016-11-14 DIAGNOSIS — Z471 Aftercare following joint replacement surgery: Secondary | ICD-10-CM | POA: Diagnosis not present

## 2016-11-14 DIAGNOSIS — Z96652 Presence of left artificial knee joint: Secondary | ICD-10-CM | POA: Diagnosis not present

## 2016-11-14 DIAGNOSIS — M1991 Primary osteoarthritis, unspecified site: Secondary | ICD-10-CM | POA: Diagnosis not present

## 2016-11-14 DIAGNOSIS — E119 Type 2 diabetes mellitus without complications: Secondary | ICD-10-CM | POA: Diagnosis not present

## 2016-11-25 ENCOUNTER — Ambulatory Visit (INDEPENDENT_AMBULATORY_CARE_PROVIDER_SITE_OTHER): Payer: Medicare HMO | Admitting: Nurse Practitioner

## 2016-11-25 ENCOUNTER — Ambulatory Visit (INDEPENDENT_AMBULATORY_CARE_PROVIDER_SITE_OTHER): Payer: Medicare HMO

## 2016-11-25 ENCOUNTER — Encounter: Payer: Self-pay | Admitting: Nurse Practitioner

## 2016-11-25 VITALS — BP 112/62 | HR 104 | Temp 96.5°F | Ht 71.0 in | Wt 194.0 lb

## 2016-11-25 DIAGNOSIS — S3091XA Unspecified superficial injury of lower back and pelvis, initial encounter: Secondary | ICD-10-CM

## 2016-11-25 DIAGNOSIS — R109 Unspecified abdominal pain: Secondary | ICD-10-CM | POA: Diagnosis not present

## 2016-11-25 DIAGNOSIS — T148XXA Other injury of unspecified body region, initial encounter: Secondary | ICD-10-CM

## 2016-11-25 NOTE — Progress Notes (Signed)
   Subjective:    Patient ID: [name], male    DOB: Jul 20, 1933, 83 y.o.   MRN: [id number]  HPI Patient comes in today with c/o low back pain. Was bending over to pick up something Saturday night and felt a pop in his left flank- this morning woke up with pain. Rates pain 10/10 when it woke him up this morning but now says he is not hurting at all right now. He has a history of kidney stones.    Review of Systems  Constitutional: Negative.   HENT: Negative.   Respiratory: Negative.   Cardiovascular: Negative.   Gastrointestinal: Negative.   Genitourinary: Positive for flank pain (left). Negative for urgency.  Musculoskeletal: Positive for back pain (left mid back).  Neurological: Negative.   Psychiatric/Behavioral: Negative.   All other systems reviewed and are negative.      Objective:   Physical Exam  Constitutional: He is oriented to person, place, and time. He appears well-developed and well-nourished. No distress.  Cardiovascular: Normal rate and regular rhythm.   Pulmonary/Chest: Effort normal and breath sounds normal.  Musculoskeletal:  FROM of lumbar spine without pain right now.  Neurological: He is alert and oriented to person, place, and time.  Skin: Skin is warm.  Psychiatric: He has a normal mood and affect. His behavior is normal. Judgment and thought content normal.   BP 112/62   Pulse (!) 104   Temp (!) 96.5 F (35.8 C) (Oral)   Ht 5\' 11"  (1.803 m)   Wt 194 lb (88 kg)   SpO2 92%   BMI 27.06 kg/m   KUB- no lower rib fracture- no visible kidney stone-Preliminary reading by Ronnald Collum, [name]  [facility]     Assessment & Plan:  1. Left flank pain - DG Abd 1 View; Future  2. Pulled muscle Moist heat to flank Continue pain meds as neeed Rest No heavy lifting RTO If not improving  [name] [name], [name]

## 2016-11-25 NOTE — Patient Instructions (Signed)
Muscle Strain A muscle strain (pulled muscle) happens when a muscle is stretched beyond normal length. It happens when a sudden, violent force stretches your muscle too far. Usually, a few of the fibers in your muscle are torn. Muscle strain is common in athletes. Recovery usually takes 1-2 weeks. Complete healing takes 5-6 weeks. Follow these instructions at home:  Follow the PRICE method of treatment to help your injury get better. Do this the first 2-3 days after the injury: ? Protect. Protect the muscle to keep it from getting injured again. ? Rest. Limit your activity and rest the injured body part. ? Ice. Put ice in a plastic bag. Place a towel between your skin and the bag. Then, apply the ice and leave it on from 15-20 minutes each hour. After the third day, switch to moist heat packs. ? Compression. Use a splint or elastic bandage on the injured area for comfort. Do not put it on too tightly. ? Elevate. Keep the injured body part above the level of your heart.  Only take medicine as told by your doctor.  Warm up before doing exercise to prevent future muscle strains. Contact a doctor if:  You have more pain or puffiness (swelling) in the injured area.  You feel numbness, tingling, or notice a loss of strength in the injured area. This information is not intended to replace advice given to you by your health care provider. Make sure you discuss any questions you have with your health care provider. Document Released: 05/20/2008 Document Revised: 01/17/2016 Document Reviewed: 03/10/2013 Elsevier Interactive Patient Education  2017 [employer]  

## 2017-01-02 ENCOUNTER — Encounter: Payer: Self-pay | Admitting: Nurse Practitioner

## 2017-01-02 ENCOUNTER — Ambulatory Visit (INDEPENDENT_AMBULATORY_CARE_PROVIDER_SITE_OTHER): Payer: Medicare HMO | Admitting: Nurse Practitioner

## 2017-01-02 VITALS — BP 134/58 | HR 80 | Temp 96.7°F | Ht 71.0 in | Wt 202.0 lb

## 2017-01-02 DIAGNOSIS — I25810 Atherosclerosis of coronary artery bypass graft(s) without angina pectoris: Secondary | ICD-10-CM | POA: Diagnosis not present

## 2017-01-02 DIAGNOSIS — E1142 Type 2 diabetes mellitus with diabetic polyneuropathy: Secondary | ICD-10-CM | POA: Diagnosis not present

## 2017-01-02 DIAGNOSIS — F411 Generalized anxiety disorder: Secondary | ICD-10-CM

## 2017-01-02 DIAGNOSIS — K219 Gastro-esophageal reflux disease without esophagitis: Secondary | ICD-10-CM | POA: Diagnosis not present

## 2017-01-02 DIAGNOSIS — I110 Hypertensive heart disease with heart failure: Secondary | ICD-10-CM | POA: Diagnosis not present

## 2017-01-02 DIAGNOSIS — Z6829 Body mass index (BMI) 29.0-29.9, adult: Secondary | ICD-10-CM | POA: Diagnosis not present

## 2017-01-02 DIAGNOSIS — E785 Hyperlipidemia, unspecified: Secondary | ICD-10-CM | POA: Diagnosis not present

## 2017-01-02 DIAGNOSIS — J449 Chronic obstructive pulmonary disease, unspecified: Secondary | ICD-10-CM | POA: Diagnosis not present

## 2017-01-02 LAB — BAYER DCA HB A1C WAIVED: HB A1C: 5.4 % (ref <7.0)

## 2017-01-02 MED ORDER — BUSPIRONE HCL 10 MG PO TABS: 10.0000 mg | ORAL_TABLET | Freq: Three times a day (TID) | ORAL | 5 refills | Status: DC

## 2017-01-02 MED ORDER — METFORMIN HCL 500 MG PO TABS: ORAL_TABLET | ORAL | 1 refills | Status: DC

## 2017-01-02 MED ORDER — OMEPRAZOLE 20 MG PO CPDR: 20.0000 mg | DELAYED_RELEASE_CAPSULE | Freq: Every day | ORAL | 1 refills | Status: DC

## 2017-01-02 MED ORDER — TAMSULOSIN HCL 0.4 MG PO CAPS: 0.4000 mg | ORAL_CAPSULE | Freq: Every day | ORAL | 5 refills | Status: DC

## 2017-01-02 MED ORDER — BUDESONIDE-FORMOTEROL FUMARATE 160-4.5 MCG/ACT IN AERO: 2.0000 | INHALATION_SPRAY | Freq: Two times a day (BID) | RESPIRATORY_TRACT | 5 refills | Status: DC

## 2017-01-02 MED ORDER — ATORVASTATIN CALCIUM 80 MG PO TABS: 80.0000 mg | ORAL_TABLET | Freq: Every day | ORAL | 3 refills | Status: DC

## 2017-01-02 NOTE — Progress Notes (Signed)
Subjective:    Patient ID: [name], male    DOB: Jan 09, 1933, 83 y.o.   MRN: [id number]  HPI  DEFOREST MAIDEN is here today for follow up of chronic medical problem.  Outpatient Encounter Prescriptions as of 01/02/2017  Medication Sig  . albuterol (PROVENTIL HFA;VENTOLIN HFA) 108 (90 Base) MCG/ACT inhaler Inhale 2 puffs into the lungs every 6 (six) hours as needed for wheezing or shortness of breath.  [name] aspirin 81 MG tablet Take 81 mg by mouth every other day.   [name] atorvastatin (LIPITOR) 80 MG tablet Take 1 tablet (80 mg total) by mouth daily.  . budesonide-formoterol (SYMBICORT) 160-4.5 MCG/ACT inhaler Inhale 2 puffs into the lungs 2 (two) times daily.  . meclizine (ANTIVERT) 12.5 MG tablet Take 1 tablet (12.5 mg total) by mouth 3 (three) times daily as needed for dizziness.  . meloxicam (MOBIC) 15 MG tablet Take 1 tablet (15 mg total) by mouth daily.  . metFORMIN (GLUCOPHAGE) 500 MG tablet TAKE 1 TABLET (500 MG TOTAL) BY MOUTH 2 (TWO) TIMES DAILY WITH A MEAL.  [name] omeprazole (PRILOSEC) 20 MG capsule Take 1 capsule (20 mg total) by mouth daily.  . tamsulosin (FLOMAX) 0.4 MG CAPS capsule Take 0.4 mg by mouth.  . TRUE METRIX BLOOD GLUCOSE TEST test strip TEST BLOOD SUGAR  ONCE  TO TWICE DAILY   No facility-administered encounter medications on file as of 01/02/2017.     1. Coronary artery disease involving other coronary artery bypass graft without angina pectoris Patient sees cardiologist every 6 months  2. Hypertensive heart disease with congestive heart failure, unspecified heart failure type ([place]) no c/o chest pain, SOB or HA- does not check blood pressure at home  3. COPD with asthma ([place]) symbicort daily- has not needed albuterol in several months  4. Type 2 diabetes mellitus with diabetic polyneuropathy, unspecified whether long term insulin use (HCC)  HGBA1c 6.4%-does not check blood sugars every day- no hypoglycemia that he is aware of  5. BMI 29.0-29.9,adult  No recent weight  gain or weight loss  6. Hyperlipidemia with target LDL less than 100  Does not watch diet    New complaints: Wife says that he gets very anxious and nervous several times a week. She says he just paces and cannot seat still     Review of Systems  Constitutional: Negative.  Negative for diaphoresis.  Eyes: Negative for pain.  Respiratory: Negative for shortness of breath.   Cardiovascular: Negative for chest pain, palpitations and leg swelling.  Gastrointestinal: Negative for abdominal pain.  Endocrine: Negative for polydipsia.  Skin: Negative for rash.  Neurological: Negative for dizziness, weakness and headaches.  Hematological: Does not bruise/bleed easily.  All other systems reviewed and are negative.      Objective:   Physical Exam  Constitutional: He is oriented to person, place, and time. He appears well-developed and well-nourished.  HENT:  Head: Normocephalic.  Right Ear: External ear normal.  Left Ear: External ear normal.  Nose: Nose normal.  Mouth/Throat: Oropharynx is clear and moist.  Eyes: EOM are normal. Pupils are equal, round, and reactive to light.  Neck: Normal range of motion. Neck supple. No JVD present. No thyromegaly present.  Cardiovascular: Normal rate, regular rhythm, normal heart sounds and intact distal pulses.  Exam reveals no gallop and no friction rub.   No murmur heard. Pulmonary/Chest: Effort normal and breath sounds normal. No respiratory distress. He has no wheezes. He has no rales. He exhibits no tenderness.  Abdominal: Soft. Bowel sounds are normal. He exhibits no mass. There is no tenderness.  Genitourinary: Prostate normal and penis normal.  Musculoskeletal: Normal range of motion. He exhibits no edema.  Lymphadenopathy:    He has no cervical adenopathy.  Neurological: He is alert and oriented to person, place, and time. No cranial nerve deficit.  Skin: Skin is warm and dry.  Psychiatric: He has a normal mood and affect. His  behavior is normal. Judgment and thought content normal.   BP (!) 134/58   Pulse 80   Temp (!) 96.7 F (35.9 C) (Oral)   Ht _0  (1.803 m)   Wt 202 lb (91.6 kg)   SpO2 96%   BMI 28.17 kg/m   hgba1c 5.4%    Assessment & Plan:  1. Coronary artery disease involving other coronary artery bypass graft without angina pectoris Keep follow up with cardiology  2. Hypertensive heart disease with congestive heart failure, unspecified heart failure type (HCC) Low sodium diet - CMP14+EGFR  3. COPD with asthma ([place]) - budesonide-formoterol (SYMBICORT) 160-4.5 MCG/ACT inhaler; Inhale 2 puffs into the lungs 2 (two) times daily.  Dispense: 1 Inhaler; Refill: 5  4. Type 2 diabetes mellitus with diabetic polyneuropathy, unspecified whether long term insulin use (HCC) Continue to watch carbsin diet - Bayer DCA Hb A1c Waived - metFORMIN (GLUCOPHAGE) 500 MG tablet; TAKE 1 TABLET (500 MG TOTAL) BY MOUTH 2 (TWO) TIMES DAILY WITH A MEAL.  Dispense: 180 tablet; Refill: 1  5. BMI 29.0-29.9,adult Discussed diet and exercise for person with BMI >25 Will recheck weight in 3-6 months  6. Hyperlipidemia with target LDL less than 100 Low fat diet - Lipid panel - atorvastatin (LIPITOR) 80 MG tablet; Take 1 tablet (80 mg total) by mouth daily.  Dispense: 90 tablet; Refill: 3  7. Gastroesophageal reflux disease without esophagitis Avoid spicy foods Do not eat 2 hours prior to bedtime  - omeprazole (PRILOSEC) 20 MG capsule; Take 1 capsule (20 mg total) by mouth daily.  Dispense: 90 capsule; Refill: 1  8. Type 2 diabetes mellitus with diabetic polyneuropathy, without long-term current use of insulin ([place])  9. GAD (generalized anxiety disorder) Stress management - busPIRone (BUSPAR) 10 MG tablet; Take 1 tablet (10 mg total) by mouth 3 (three) times daily.  Dispense: 30 tablet; Refill: 5    Labs pending Health maintenance reviewed Diet and exercise encouraged Continue all meds Follow up  In 3  month   [place], [name]

## 2017-01-02 NOTE — Patient Instructions (Signed)
Stress and Stress Management Stress is a normal reaction to life events. It is what you feel when life demands more than you are used to or more than you can handle. Some stress can be useful. For example, the stress reaction can help you catch the last bus of the day, study for a test, or meet a deadline at work. But stress that occurs too often or for too long can cause problems. It can affect your emotional health and interfere with relationships and normal daily activities. Too much stress can weaken your immune system and increase your risk for physical illness. If you already have a medical problem, stress can make it worse. What are the causes? All sorts of life events may cause stress. An event that causes stress for one person may not be stressful for another person. Major life events commonly cause stress. These may be positive or negative. Examples include losing your job, moving into a new home, getting married, having a baby, or losing a loved one. Less obvious life events may also cause stress, especially if they occur day after day or in combination. Examples include working long hours, driving in traffic, caring for children, being in debt, or being in a difficult relationship. What are the signs or symptoms? Stress may cause emotional symptoms including, the following:  Anxiety. This is feeling worried, afraid, on edge, overwhelmed, or out of control.  Anger. This is feeling irritated or impatient.  Depression. This is feeling sad, down, helpless, or guilty.  Difficulty focusing, remembering, or making decisions.  Stress may cause physical symptoms, including the following:  Aches and pains. These may affect your head, neck, back, stomach, or other areas of your body.  Tight muscles or clenched jaw.  Low energy or trouble sleeping.  Stress may cause unhealthy behaviors, including the following:  Eating to feel better (overeating) or skipping meals.  Sleeping too little,  too much, or both.  Working too much or putting off tasks (procrastination).  Smoking, drinking alcohol, or using drugs to feel better.  How is this diagnosed? Stress is diagnosed through an assessment by your health care provider. Your health care provider will ask questions about your symptoms and any stressful life events.Your health care provider will also ask about your medical history and may order blood tests or other tests. Certain medical conditions and medicine can cause physical symptoms similar to stress. Mental illness can cause emotional symptoms and unhealthy behaviors similar to stress. Your health care provider may refer you to a mental health professional for further evaluation. How is this treated? Stress management is the recommended treatment for stress.The goals of stress management are reducing stressful life events and coping with stress in healthy ways. Techniques for reducing stressful life events include the following:  Stress identification. Self-monitor for stress and identify what causes stress for you. These skills may help you to avoid some stressful events.  Time management. Set your priorities, keep a calendar of events, and learn to say "no." These tools can help you avoid making too many commitments.  Techniques for coping with stress include the following:  Rethinking the problem. Try to think realistically about stressful events rather than ignoring them or overreacting. Try to find the positives in a stressful situation rather than focusing on the negatives.  Exercise. Physical exercise can release both physical and emotional tension. The key is to find a form of exercise you enjoy and do it regularly.  Relaxation techniques. These relax the body and   meditation, tai chi, biofeedback, deep breathing, progressive muscle relaxation, listening to music, being out in nature, journaling, and other hobbies. Again, the key is to find one or  more that you enjoy and can do regularly.  Healthy lifestyle. Eat a balanced diet, get plenty of sleep, and do not smoke. Avoid using alcohol or drugs to relax.  Strong support network. Spend time with family, friends, or other people you enjoy being around.Express your feelings and talk things over with someone you trust. Counseling or talktherapy with a mental health professional may be helpful if you are having difficulty managing stress on your own. Medicine is typically not recommended for the treatment of stress.Talk to your health care provider if you think you need medicine for symptoms of stress. Follow these instructions at home:  Keep all follow-up visits as directed by your health care provider.  Take all medicines as directed by your health care provider. Contact a health care provider if:  Your symptoms get worse or you start having new symptoms.  You feel overwhelmed by your problems and can no longer manage them on your own. Get help right away if:  You feel like hurting yourself or someone else. This information is not intended to replace advice given to you by your health care provider. Make sure you discuss any questions you have with your health care provider. Document Released: 02/04/2001 Document Revised: 01/17/2016 Document Reviewed: 04/05/2013 Elsevier Interactive Patient Education  2017 [employer].

## 2017-01-03 LAB — LIPID PANEL
CHOL/HDL RATIO: 3.2 ratio (ref 0.0–5.0)
CHOLESTEROL TOTAL: 97 mg/dL — AB (ref 100–199)
HDL: 30 mg/dL — ABNORMAL LOW (ref >39)
LDL Calculated: 47 mg/dL (ref 0–99)
TRIGLYCERIDES: 102 mg/dL (ref 0–149)
VLDL Cholesterol Cal: 20 mg/dL (ref 5–40)

## 2017-01-03 LAB — CMP14+EGFR
ALT: 12 [IU]/L (ref 0–44)
AST: 14 [IU]/L (ref 0–40)
Albumin/Globulin Ratio: 1.7 (ref 1.2–2.2)
Albumin: 3.8 g/dL (ref 3.5–4.7)
Alkaline Phosphatase: 81 [IU]/L (ref 39–117)
BUN/Creatinine Ratio: 20 (ref 10–24)
BUN: 15 mg/dL (ref 8–27)
Bilirubin Total: 0.9 mg/dL (ref 0.0–1.2)
CO2: 24 mmol/L (ref 18–29)
Calcium: 9.5 mg/dL (ref 8.6–10.2)
Chloride: 102 mmol/L (ref 96–106)
Creatinine, Ser: 0.76 mg/dL (ref 0.76–1.27)
GFR calc Af Amer: 99 mL/min/{1.73_m2}
GFR calc non Af Amer: 86 mL/min/{1.73_m2}
Globulin, Total: 2.2 g/dL (ref 1.5–4.5)
Glucose: 118 mg/dL — ABNORMAL HIGH (ref 65–99)
Potassium: 4.6 mmol/L (ref 3.5–5.2)
Sodium: 141 mmol/L (ref 134–144)
Total Protein: 6 g/dL (ref 6.0–8.5)

## 2017-02-27 ENCOUNTER — Telehealth: Payer: Self-pay | Admitting: Nurse Practitioner

## 2017-02-27 MED ORDER — TAMSULOSIN HCL 0.4 MG PO CAPS: 0.4000 mg | ORAL_CAPSULE | Freq: Every day | ORAL | 3 refills | Status: DC

## 2017-02-27 NOTE — Telephone Encounter (Signed)
Pt aware by detailed VM that meds was sent for #90 and refills to [facility]

## 2017-03-19 ENCOUNTER — Encounter: Payer: Self-pay | Admitting: Cardiology

## 2017-03-19 ENCOUNTER — Ambulatory Visit (INDEPENDENT_AMBULATORY_CARE_PROVIDER_SITE_OTHER): Payer: Medicare HMO | Admitting: Cardiology

## 2017-03-19 VITALS — BP 164/64 | HR 65 | Ht 70.0 in | Wt 205.0 lb

## 2017-03-19 DIAGNOSIS — E782 Mixed hyperlipidemia: Secondary | ICD-10-CM | POA: Diagnosis not present

## 2017-03-19 DIAGNOSIS — I10 Essential (primary) hypertension: Secondary | ICD-10-CM

## 2017-03-19 DIAGNOSIS — I2510 Atherosclerotic heart disease of native coronary artery without angina pectoris: Secondary | ICD-10-CM

## 2017-03-19 NOTE — Patient Instructions (Signed)
Your physician wants you to follow-up in: 6 MONTHS WITH DR BRANCH You will receive a reminder letter in the mail two months in advance. If you don't receive a letter, please call our office to schedule the follow-up appointment.  Your physician recommends that you continue on your current medications as directed. Please refer to the Current Medication list given to you today.  Your physician has requested that you regularly monitor and record your blood pressure readings at home FOR 1 WEEK AND CALL US WITH READINGS. Please use the same machine at the same time of day to check your readings and record them to bring to your follow-up visit.   Thank you for choosing [facility]!!    
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
## 2017-03-23 ENCOUNTER — Encounter: Payer: Self-pay | Admitting: Family Medicine

## 2017-03-23 ENCOUNTER — Ambulatory Visit (INDEPENDENT_AMBULATORY_CARE_PROVIDER_SITE_OTHER): Payer: Medicare HMO | Admitting: Family Medicine

## 2017-03-23 VITALS — BP 122/66 | HR 70 | Temp 97.1°F | Ht 70.0 in | Wt 203.0 lb

## 2017-03-23 DIAGNOSIS — M545 Low back pain: Secondary | ICD-10-CM | POA: Diagnosis not present

## 2017-03-23 LAB — URINALYSIS
Bilirubin, UA: NEGATIVE
Glucose, UA: NEGATIVE
LEUKOCYTES UA: NEGATIVE
NITRITE UA: NEGATIVE
Specific Gravity, UA: >1.03 — ABNORMAL HIGH (ref 1.005–1.030)
Urobilinogen, Ur: 1 mg/dL (ref 0.2–1.0)
pH, UA: 5.5 (ref 5.0–7.5)

## 2017-03-23 MED ORDER — OXYCODONE-ACETAMINOPHEN 5-325 MG PO TABS: 1.0000 | ORAL_TABLET | Freq: Four times a day (QID) | ORAL | 0 refills | Status: DC | PRN

## 2017-03-23 NOTE — Progress Notes (Signed)
   HPI  Patient presents today with right-sided low back pain.  Patient states that this is similar to previous episode of renal stones he had. He states he is also recently been putting down some flooring which may cause the pain. He has pain with movement.  He knows that he does have stones present  He is taking Flomax, he has no problems passing urine.  PMH: Smoking status noted ROS: Per HPI  Objective: BP 122/66   Pulse 70   Temp (!) 97.1 F (36.2 C) (Oral)   Ht 5\' 10"  (1.778 m)   Wt 203 lb (92.1 kg)   BMI 29.13 kg/m  Gen: NAD, alert, cooperative with exam HEENT: NCAT CV: RRR, good S1/S2, no murmur Resp: CTABL, no wheezes, non-labored Ext: No edema, warm Neuro: Alert and oriented, No gross deficits MSK:  No tenderness to palpation over the right CVA areas or the right lower back  Assessment and plan:  # Right-sided low back pain, acute Patient with right-sided low back pain, he states similar to previous episodes of renal stones. Treated as if he is passing a stone with Percocet, with caution, explained that this is temporary. Strain urine It sounds more like musculoskeletal back pain recommended against NSAIDs given history of CABG His urine tonight that shows some blood. Recent CT scan has shown 7-8 mm stone in the right.     Orders Placed This Encounter  Procedures  . Urinalysis    Meds ordered this encounter  Medications  . oxyCODONE-acetaminophen (ROXICET) 5-325 MG tablet    Sig: Take 1 tablet by mouth every 6 (six) hours as needed for severe pain.    Dispense:  20 tablet    Refill:  0    Laroy Apple, MD [facility] 03/23/2017, 6:09 PM

## 2017-03-23 NOTE — Patient Instructions (Signed)
Great to meet you!   Try to rest for a couple of days, try heat on the area that is bothering you. Also strain your urine.   Use percocet for pain as needed, be very careful with it as it may make you dizzy or fall.

## 2017-03-25 DIAGNOSIS — H2513 Age-related nuclear cataract, bilateral: Secondary | ICD-10-CM | POA: Diagnosis not present

## 2017-03-25 DIAGNOSIS — H40033 Anatomical narrow angle, bilateral: Secondary | ICD-10-CM | POA: Diagnosis not present

## 2017-03-25 LAB — HM DIABETES EYE EXAM

## 2017-04-04 IMAGING — DX DG ABDOMEN 1V
2 series · 2 of 2 positions shown · non-contrast
Comparison: Abdominopelvic CT scan January 22, 2016

CLINICAL DATA: Left upper quadrant pain awaken patient from sleep.
History of coronary artery disease and peripheral vascular disease,
abdominal aortic aneurysm, diabetes, kidney stones.

EXAM:
ABDOMEN - 1 VIEW

[abdomen kub (1 of 2)]
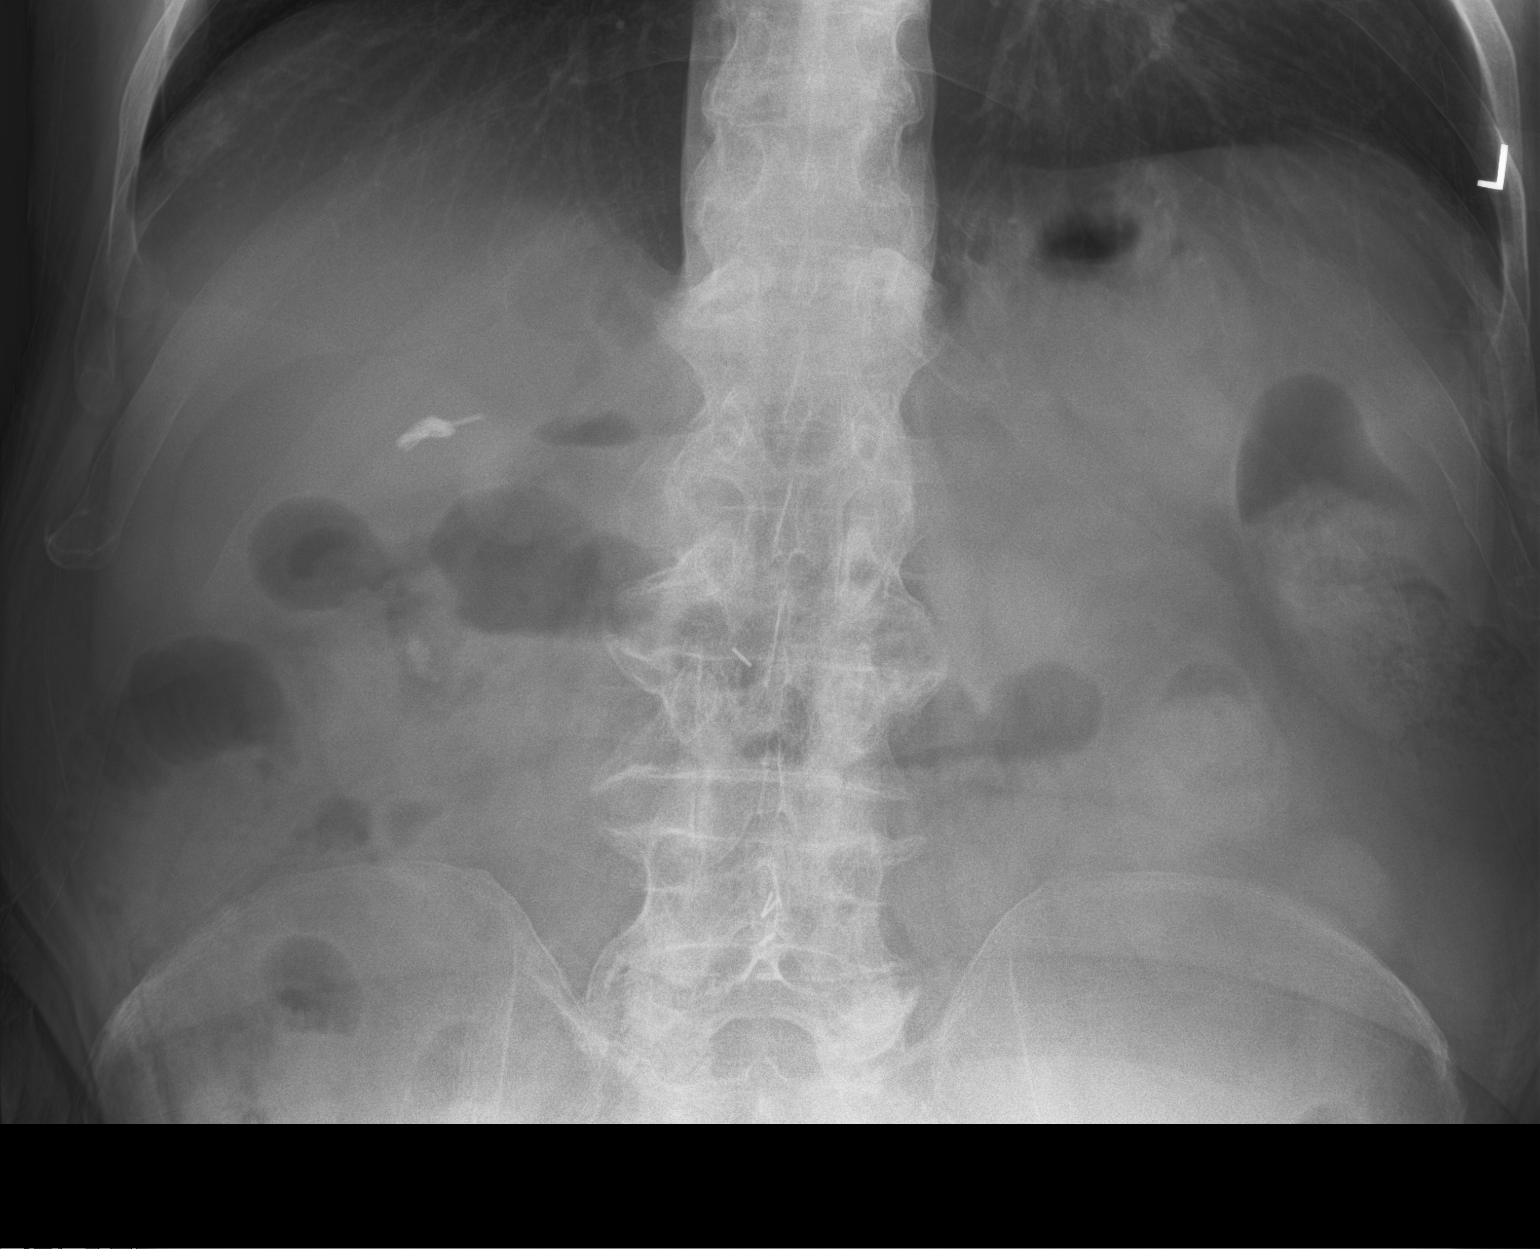

[abdomen kub (2 of 2)]
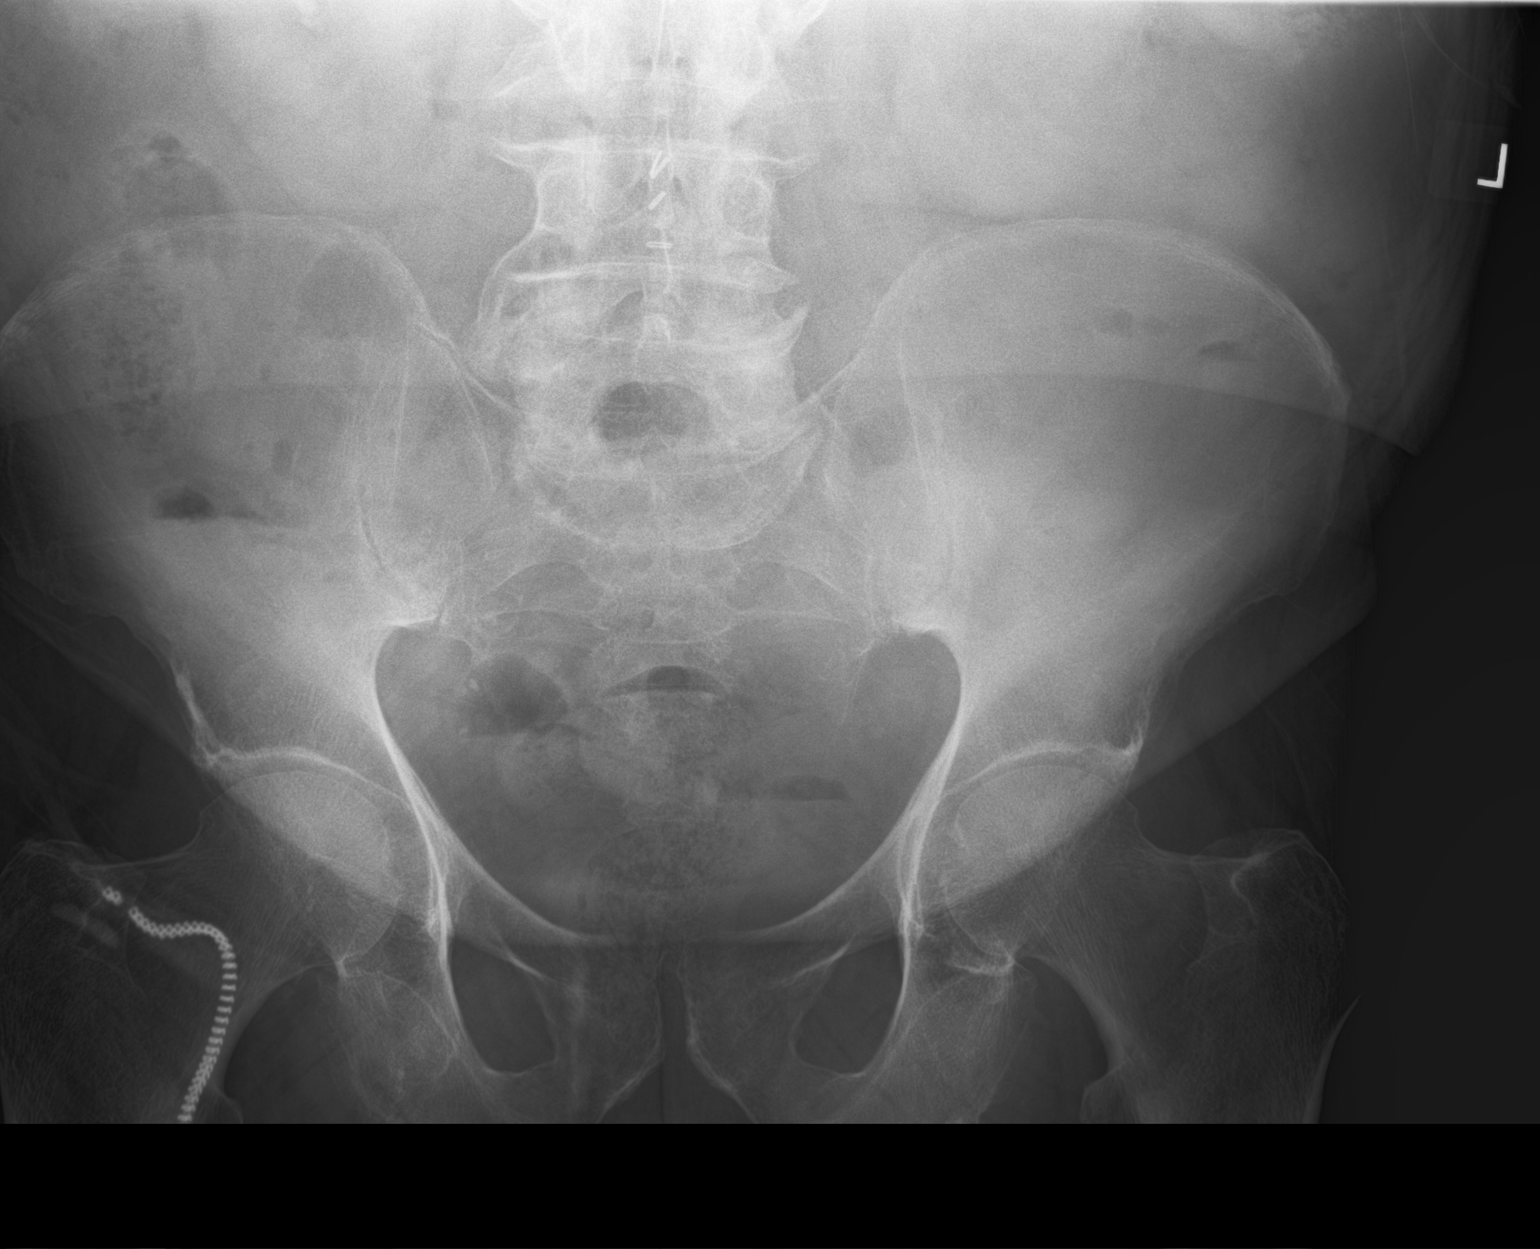

[2 of 2 positions shown; findings below may reference images not displayed]

FINDINGS: The bowel gas pattern is within the limits of normal. There are
coarse calcifications projecting over the midpole of the right
kidney. No left-sided kidney stones are observed. No ureteral or
bladder stones are demonstrated. There is multilevel degenerative
disc disease of the lumbar spine. There surgical clips in the
gallbladder fossa.
IMPRESSION: Known right-sided kidney stones. No definite left-sided kidney
stones. No acute bowel abnormality is observed.

## 2017-04-07 ENCOUNTER — Ambulatory Visit (INDEPENDENT_AMBULATORY_CARE_PROVIDER_SITE_OTHER): Payer: Medicare HMO | Admitting: Nurse Practitioner

## 2017-04-07 ENCOUNTER — Telehealth: Payer: Self-pay | Admitting: *Deleted

## 2017-04-07 ENCOUNTER — Encounter: Payer: Self-pay | Admitting: Nurse Practitioner

## 2017-04-07 VITALS — BP 132/64 | HR 64 | Temp 96.9°F | Ht 70.0 in | Wt 197.0 lb

## 2017-04-07 DIAGNOSIS — D696 Thrombocytopenia, unspecified: Secondary | ICD-10-CM

## 2017-04-07 DIAGNOSIS — E785 Hyperlipidemia, unspecified: Secondary | ICD-10-CM | POA: Diagnosis not present

## 2017-04-07 DIAGNOSIS — R001 Bradycardia, unspecified: Secondary | ICD-10-CM | POA: Diagnosis not present

## 2017-04-07 DIAGNOSIS — J449 Chronic obstructive pulmonary disease, unspecified: Secondary | ICD-10-CM

## 2017-04-07 DIAGNOSIS — K219 Gastro-esophageal reflux disease without esophagitis: Secondary | ICD-10-CM | POA: Diagnosis not present

## 2017-04-07 DIAGNOSIS — N3946 Mixed incontinence: Secondary | ICD-10-CM | POA: Diagnosis not present

## 2017-04-07 DIAGNOSIS — I25810 Atherosclerosis of coronary artery bypass graft(s) without angina pectoris: Secondary | ICD-10-CM | POA: Diagnosis not present

## 2017-04-07 DIAGNOSIS — G4733 Obstructive sleep apnea (adult) (pediatric): Secondary | ICD-10-CM

## 2017-04-07 DIAGNOSIS — Z6829 Body mass index (BMI) 29.0-29.9, adult: Secondary | ICD-10-CM

## 2017-04-07 DIAGNOSIS — I110 Hypertensive heart disease with heart failure: Secondary | ICD-10-CM

## 2017-04-07 DIAGNOSIS — F411 Generalized anxiety disorder: Secondary | ICD-10-CM | POA: Diagnosis not present

## 2017-04-07 DIAGNOSIS — E1142 Type 2 diabetes mellitus with diabetic polyneuropathy: Secondary | ICD-10-CM

## 2017-04-07 LAB — CMP14+EGFR
ALK PHOS: 76 IU/L (ref 39–117)
ALT: 13 IU/L (ref 0–44)
AST: 17 IU/L (ref 0–40)
Albumin/Globulin Ratio: 2 (ref 1.2–2.2)
Albumin: 4.2 g/dL (ref 3.5–4.7)
BUN / CREAT RATIO: 18 (ref 10–24)
BUN: 16 mg/dL (ref 8–27)
Bilirubin Total: 1.2 mg/dL (ref 0.0–1.2)
CHLORIDE: 105 mmol/L (ref 96–106)
CO2: 25 mmol/L (ref 20–29)
CREATININE: 0.89 mg/dL (ref 0.76–1.27)
Calcium: 9.6 mg/dL (ref 8.6–10.2)
GFR calc Af Amer: 92 mL/min/{1.73_m2} (ref >59)
GFR calc non Af Amer: 80 mL/min/{1.73_m2} (ref >59)
GLUCOSE: 124 mg/dL — AB (ref 65–99)
Globulin, Total: 2.1 g/dL (ref 1.5–4.5)
Potassium: 4.4 mmol/L (ref 3.5–5.2)
Sodium: 143 mmol/L (ref 134–144)
Total Protein: 6.3 g/dL (ref 6.0–8.5)

## 2017-04-07 LAB — LIPID PANEL
CHOLESTEROL TOTAL: 82 mg/dL — AB (ref 100–199)
Chol/HDL Ratio: 2.4 ratio (ref 0.0–5.0)
HDL: 34 mg/dL — ABNORMAL LOW (ref >39)
LDL CALC: 32 mg/dL (ref 0–99)
Triglycerides: 80 mg/dL (ref 0–149)
VLDL CHOLESTEROL CAL: 16 mg/dL (ref 5–40)

## 2017-04-07 LAB — BAYER DCA HB A1C WAIVED: HB A1C: 6.2 % (ref <7.0)

## 2017-04-07 MED ORDER — BUSPIRONE HCL 10 MG PO TABS: 10.0000 mg | ORAL_TABLET | Freq: Three times a day (TID) | ORAL | 5 refills | Status: DC

## 2017-04-07 MED ORDER — BUDESONIDE-FORMOTEROL FUMARATE 160-4.5 MCG/ACT IN AERO: 2.0000 | INHALATION_SPRAY | Freq: Two times a day (BID) | RESPIRATORY_TRACT | 5 refills | Status: DC

## 2017-04-07 MED ORDER — ATORVASTATIN CALCIUM 80 MG PO TABS: 80.0000 mg | ORAL_TABLET | Freq: Every day | ORAL | 1 refills | Status: DC

## 2017-04-07 MED ORDER — OMEPRAZOLE 20 MG PO CPDR: 20.0000 mg | DELAYED_RELEASE_CAPSULE | Freq: Every day | ORAL | 1 refills | Status: DC

## 2017-04-07 MED ORDER — METFORMIN HCL 500 MG PO TABS: ORAL_TABLET | ORAL | 1 refills | Status: DC

## 2017-04-07 MED ORDER — TAMSULOSIN HCL 0.4 MG PO CAPS: 0.4000 mg | ORAL_CAPSULE | Freq: Every day | ORAL | 1 refills | Status: DC

## 2017-04-07 NOTE — Patient Instructions (Signed)

## 2017-04-07 NOTE — Telephone Encounter (Signed)
Occasional high bp's, overall trend is ok. No med changes at this time  Zandra Abts MD

## 2017-04-07 NOTE — Telephone Encounter (Signed)
Patient's grand-daughter [name]) calling to report BP readings as requested at last OV.   03/20/17  6:20 am - 151/73  63                 7:10 pm - 140/57  71  03/21/17  7:10 am - 142/83  68               6:20 pm - 144/65  74  03/22/17  6:50 am - 168/85  67                5:30 pm - 131/75  77  03/23/17  6:30 am - 145/88  67               9:00 pm - 147/54  54  03/24/17  9:05 am -  148/70  59               6:00 pm - 136/76  56

## 2017-04-07 NOTE — Telephone Encounter (Signed)
Wife [name]) notified.

## 2017-04-07 NOTE — Progress Notes (Signed)
Subjective:    Patient ID: LETRELL ATTWOOD, male    DOB: [birth year], 83 y.o.   MRN: [id number]  HPI  [name] is here today for follow up of chronic medical problem.  Outpatient Encounter Prescriptions as of 04/07/2017  Medication Sig  . albuterol (PROVENTIL HFA;VENTOLIN HFA) 108 (90 Base) MCG/ACT inhaler Inhale 2 puffs into the lungs every 6 (six) hours as needed for wheezing or shortness of breath.  [name] aspirin 81 MG tablet Take 81 mg by mouth every other day.   [name] atorvastatin (LIPITOR) 80 MG tablet Take 1 tablet (80 mg total) by mouth daily.  . budesonide-formoterol (SYMBICORT) 160-4.5 MCG/ACT inhaler Inhale 2 puffs into the lungs 2 (two) times daily.  . busPIRone (BUSPAR) 10 MG tablet Take 1 tablet (10 mg total) by mouth 3 (three) times daily.  . diphenhydrAMINE (BENADRYL) 25 MG tablet Take 25 mg by mouth daily. Take 2 at bedtime   . meclizine (ANTIVERT) 12.5 MG tablet Take 1 tablet (12.5 mg total) by mouth 3 (three) times daily as needed for dizziness.  . metFORMIN (GLUCOPHAGE) 500 MG tablet TAKE 1 TABLET (500 MG TOTAL) BY MOUTH 2 (TWO) TIMES DAILY WITH A MEAL.  [name] omeprazole (PRILOSEC) 20 MG capsule Take 1 capsule (20 mg total) by mouth daily.  [name] oxyCODONE-acetaminophen (ROXICET) 5-325 MG tablet Take 1 tablet by mouth every 6 (six) hours as needed for severe pain.  . tamsulosin (FLOMAX) 0.4 MG CAPS capsule Take 1 capsule (0.4 mg total) by mouth daily after supper.  . TRUE METRIX BLOOD GLUCOSE TEST test strip TEST BLOOD SUGAR  ONCE  TO TWICE DAILY   No facility-administered encounter medications on file as of 04/07/2017.     1. Hypertensive heart disease with congestive heart failure, unspecified heart failure type ([place]) doing well- only has SOB when he is over active  2. Hyperlipidemia with target LDL less than 100  Eats whatever he wants to.  3. Type 2 diabetes mellitus with diabetic polyneuropathy, without long-term current use of insulin (HCC) hgba1c 5.4%. He does not check  blood sugars at home daily but it ususally runs aroun 100-110  4. Sinus bradycardia- ? symptomatic  No complaints  5. Coronary artery disease involving other coronary artery bypass graft without angina pectoris sees cardiologist every 6  Months- had visit 3 weeks ago and EKG "looked good"  6. OSA (obstructive sleep apnea)  Does not sleep with CPAP machine- says that he just cant tolerate  7. COPD with asthma ([place])  Use symbicort BID- no need for albuterol  8. Thrombocytopenia ([place])  Will check labs today  9. BMI 29.0-29.9,adult  No recent weight changes  10. Mixed incontinence  Tolerate well    New complaints: None today  Social history: Wife still living and is pretty healthy- she takes good care of him.    Review of Systems  Constitutional: Negative for activity change and appetite change.  HENT: Negative.   Eyes: Negative for pain.  Respiratory: Positive for shortness of breath. Negative for cough.   Cardiovascular: Negative for chest pain, palpitations and leg swelling.  Gastrointestinal: Negative for abdominal pain.  Endocrine: Negative for polydipsia.  Genitourinary: Negative.   Skin: Negative for rash.  Neurological: Negative for dizziness, weakness and headaches.  Hematological: Does not bruise/bleed easily.  Psychiatric/Behavioral: Negative.   All other systems reviewed and are negative.      Objective:   Physical Exam  Constitutional: He is oriented to person, place, and time. He appears  well-developed and well-nourished.  HENT:  Head: Normocephalic.  Right Ear: External ear normal.  Left Ear: External ear normal.  Nose: Nose normal.  Mouth/Throat: Oropharynx is clear and moist.  Eyes: Pupils are equal, round, and reactive to light. EOM are normal.  Neck: Normal range of motion. Neck supple. No JVD present. No thyromegaly present.  Cardiovascular: Normal rate, regular rhythm, normal heart sounds and intact distal pulses.  Exam reveals no gallop and no  friction rub.   No murmur heard. Pulmonary/Chest: Effort normal and breath sounds normal. No respiratory distress. He has no wheezes. He has no rales. He exhibits no tenderness.  Abdominal: Soft. Bowel sounds are normal. He exhibits no mass. There is no tenderness.  Genitourinary: Prostate normal and penis normal.  Musculoskeletal: Normal range of motion. He exhibits no edema.  Lymphadenopathy:    He has no cervical adenopathy.  Neurological: He is alert and oriented to person, place, and time. No cranial nerve deficit.  Skin: Skin is warm and dry.  Psychiatric: He has a normal mood and affect. His behavior is normal. Judgment and thought content normal.   BP 132/64   Pulse 64   Temp (!) 96.9 F (36.1 C) (Oral)   Ht '5\' 10"'$  (1.778 m)   Wt 197 lb (89.4 kg)   SpO2 98%   BMI 28.27 kg/m   Hgba1c 6.2%    Assessment & Plan:  1. Hypertensive heart disease with congestive heart failure, unspecified heart failure type ([place]) Keep follow up with cardiology - CMP14+EGFR  2. Hyperlipidemia with target LDL less than 100 Low fat diet - Lipid panel - atorvastatin (LIPITOR) 80 MG tablet; Take 1 tablet (80 mg total) by mouth daily.  Dispense: 90 tablet; Refill: 1  3. Type 2 diabetes mellitus with diabetic polyneuropathy, without long-term current use of insulin (HCC) Continue to watch carbs in diet - Bayer DCA Hb A1c Waived - Microalbumin / creatinine urine ratio - metFORMIN (GLUCOPHAGE) 500 MG tablet; TAKE 1 TABLET (500 MG TOTAL) BY MOUTH 2 (TWO) TIMES DAILY WITH A MEAL.  Dispense: 180 tablet; Refill: 1  4. Sinus bradycardia- ? symptomatic  5. Coronary artery disease involving other coronary artery bypass graft without angina pectoris  6. OSA (obstructive sleep apnea) Refuses CPAP machine  7. COPD with asthma ([place]) Continue inhalers as needed - budesonide-formoterol (SYMBICORT) 160-4.5 MCG/ACT inhaler; Inhale 2 puffs into the lungs 2 (two) times daily.  Dispense: 1 Inhaler; Refill:  5  8. Thrombocytopenia ([place]) Labs pending  9. BMI 29.0-29.9,adult Discussed diet and exercise for person with BMI >25 Will recheck weight in 3-6 months  10. Mixed incontinence Keep follow up with urologist - tamsulosin (FLOMAX) 0.4 MG CAPS capsule; Take 1 capsule (0.4 mg total) by mouth daily after supper.  Dispense: 90 capsule; Refill: 1  11. GAD (generalized anxiety disorder) Stress management - busPIRone (BUSPAR) 10 MG tablet; Take 1 tablet (10 mg total) by mouth 3 (three) times daily.  Dispense: 30 tablet; Refill: 5  12. Gastroesophageal reflux disease without esophagitis Avoid spicy foods Do not eat 2 hours prior to bedtime - omeprazole (PRILOSEC) 20 MG capsule; Take 1 capsule (20 mg total) by mouth daily.  Dispense: 90 capsule; Refill: 1    Labs pending Health maintenance reviewed Diet and exercise encouraged Continue all meds Follow up  In 3 months   [place], [name]

## 2017-04-08 LAB — MICROALBUMIN / CREATININE URINE RATIO
CREATININE, UR: 166.9 mg/dL
MICROALBUM., U, RANDOM: 11.4 ug/mL
Microalb/Creat Ratio: 6.8 mg/g creat (ref 0.0–30.0)

## 2017-06-24 ENCOUNTER — Ambulatory Visit (INDEPENDENT_AMBULATORY_CARE_PROVIDER_SITE_OTHER): Payer: Medicare HMO | Admitting: Family Medicine

## 2017-06-24 ENCOUNTER — Ambulatory Visit (INDEPENDENT_AMBULATORY_CARE_PROVIDER_SITE_OTHER): Payer: Medicare HMO

## 2017-06-24 ENCOUNTER — Encounter: Payer: Self-pay | Admitting: Family Medicine

## 2017-06-24 VITALS — BP 169/78 | HR 69 | Temp 96.7°F | Ht 70.0 in | Wt 202.0 lb

## 2017-06-24 DIAGNOSIS — W19XXXA Unspecified fall, initial encounter: Secondary | ICD-10-CM | POA: Diagnosis not present

## 2017-06-24 DIAGNOSIS — S2242XA Multiple fractures of ribs, left side, initial encounter for closed fracture: Secondary | ICD-10-CM | POA: Diagnosis not present

## 2017-06-24 DIAGNOSIS — R0989 Other specified symptoms and signs involving the circulatory and respiratory systems: Secondary | ICD-10-CM | POA: Diagnosis not present

## 2017-06-24 DIAGNOSIS — R0781 Pleurodynia: Secondary | ICD-10-CM | POA: Diagnosis not present

## 2017-06-24 MED ORDER — HYDROCODONE-ACETAMINOPHEN 5-325 MG PO TABS: 1.0000 | ORAL_TABLET | Freq: Four times a day (QID) | ORAL | 0 refills | Status: DC | PRN
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
## 2017-06-24 NOTE — Patient Instructions (Signed)
I have prescribed you a short course of pain medication to help relieve your pain at nighttime.  As we discussed please take this exactly as directed.   Please get Colace or a stool softener to take daily while you are on the pain medications.  If you develop worsening chest/rib pain, shortness of breath, cough up blood or develop a fever, please seek immediate medical attention.  You may continue Mucinex for your chest congestion.  No physical exertion for the next 3 weeks.   Rib Fracture A rib fracture is a break or crack in one of the bones of the ribs. The ribs are a group of long, curved bones that wrap around your chest and attach to your spine. They protect your lungs and other organs in the chest cavity. A broken or cracked rib is often painful, but most do not cause other problems. Most rib fractures heal on their own over time. However, rib fractures can be more serious if multiple ribs are broken or if broken ribs move out of place and push against other structures. What are the causes?  A direct blow to the chest. For example, this could happen during contact sports, a car accident, or a fall against a hard object.  Repetitive movements with high force, such as pitching a baseball or having severe coughing spells. What are the signs or symptoms?  Pain when you breathe in or cough.  Pain when someone presses on the injured area. How is this diagnosed? Your caregiver will perform a physical exam. Various imaging tests may be ordered to confirm the diagnosis and to look for related injuries. These tests may include a chest X-ray, computed tomography (CT), magnetic resonance imaging (MRI), or a bone scan. How is this treated? Rib fractures usually heal on their own in 1-3 months. The longer healing period is often associated with a continued cough or other aggravating activities. During the healing period, pain control is very important. Medication is usually given to control pain.  Hospitalization or surgery may be needed for more severe injuries, such as those in which multiple ribs are broken or the ribs have moved out of place. Follow these instructions at home:  Avoid strenuous activity and any activities or movements that cause pain. Be careful during activities and avoid bumping the injured rib.  Gradually increase activity as directed by your caregiver.  Only take over-the-counter or prescription medications as directed by your caregiver. Do not take other medications without asking your caregiver first.  Apply ice to the injured area for the first 1-2 days after you have been treated or as directed by your caregiver. Applying ice helps to reduce inflammation and pain. ? Put ice in a plastic bag. ? Place a towel between your skin and the bag. ? Leave the ice on for 15-20 minutes at a time, every 2 hours while you are awake.  Perform deep breathing as directed by your caregiver. This will help prevent pneumonia, which is a common complication of a broken rib. Your caregiver may instruct you to: ? Take deep breaths several times a day. ? Try to cough several times a day, holding a pillow against the injured area. ? Use a device called an incentive spirometer to practice deep breathing several times a day.  Drink enough fluids to keep your urine clear or pale yellow. This will help you avoid constipation.  Do not wear a rib belt or binder. These restrict breathing, which can lead to pneumonia. Get help  right away if:  You have a fever.  You have difficulty breathing or shortness of breath.  You develop a continual cough, or you cough up thick or bloody sputum.  You feel sick to your stomach (nausea), throw up (vomit), or have abdominal pain.  You have worsening pain not controlled with medications. This information is not intended to replace advice given to you by your health care provider. Make sure you discuss any questions you have with your health care  provider. Document Released: 08/11/2005 Document Revised: 01/17/2016 Document Reviewed: 10/13/2012 Elsevier Interactive Patient Education  [employer].

## 2017-06-29 ENCOUNTER — Telehealth: Payer: Self-pay | Admitting: Nurse Practitioner

## 2017-06-29 MED ORDER — MECLIZINE HCL 12.5 MG PO TABS: 12.5000 mg | ORAL_TABLET | Freq: Three times a day (TID) | ORAL | 1 refills | Status: DC | PRN

## 2017-06-29 NOTE — Telephone Encounter (Signed)
Patient aware.

## 2017-06-29 NOTE — Telephone Encounter (Signed)
Please review and advise.

## 2017-06-29 NOTE — Telephone Encounter (Signed)
rx sent to pharmacy

## 2017-06-29 NOTE — Telephone Encounter (Signed)
What is the name of the medication? meclizine (ANTIVERT) 12.5 MG tablet  Have you contacted your pharmacy to request a refill? no  Which pharmacy would you like this sent to? [facility]   Patient notified that their request is being sent to the clinical staff for review and that they should receive a call once it is complete. If they do not receive a call within 24 hours they can check with their pharmacy or our office.

## 2017-07-09 ENCOUNTER — Other Ambulatory Visit: Payer: Self-pay | Admitting: Nurse Practitioner

## 2017-07-09 ENCOUNTER — Ambulatory Visit: Payer: Medicare HMO | Admitting: Nurse Practitioner

## 2017-07-09 ENCOUNTER — Encounter: Payer: Self-pay | Admitting: Nurse Practitioner

## 2017-07-09 VITALS — BP 127/67 | HR 65 | Temp 96.8°F | Ht 70.0 in | Wt 205.0 lb

## 2017-07-09 DIAGNOSIS — E1142 Type 2 diabetes mellitus with diabetic polyneuropathy: Secondary | ICD-10-CM | POA: Diagnosis not present

## 2017-07-09 DIAGNOSIS — Z6829 Body mass index (BMI) 29.0-29.9, adult: Secondary | ICD-10-CM

## 2017-07-09 DIAGNOSIS — J449 Chronic obstructive pulmonary disease, unspecified: Secondary | ICD-10-CM

## 2017-07-09 DIAGNOSIS — Z86010 Personal history of colonic polyps: Secondary | ICD-10-CM | POA: Diagnosis not present

## 2017-07-09 DIAGNOSIS — C61 Malignant neoplasm of prostate: Secondary | ICD-10-CM | POA: Diagnosis not present

## 2017-07-09 DIAGNOSIS — I110 Hypertensive heart disease with heart failure: Secondary | ICD-10-CM

## 2017-07-09 DIAGNOSIS — E785 Hyperlipidemia, unspecified: Secondary | ICD-10-CM | POA: Diagnosis not present

## 2017-07-09 DIAGNOSIS — G4733 Obstructive sleep apnea (adult) (pediatric): Secondary | ICD-10-CM

## 2017-07-09 DIAGNOSIS — Z23 Encounter for immunization: Secondary | ICD-10-CM | POA: Diagnosis not present

## 2017-07-09 DIAGNOSIS — R001 Bradycardia, unspecified: Secondary | ICD-10-CM

## 2017-07-09 DIAGNOSIS — I493 Ventricular premature depolarization: Secondary | ICD-10-CM | POA: Diagnosis not present

## 2017-07-09 LAB — BAYER DCA HB A1C WAIVED: HB A1C (BAYER DCA - WAIVED): 6.2 % (ref <7.0)

## 2017-07-09 NOTE — Progress Notes (Signed)
Subjective:    Patient ID: [name], male    DOB: [birth year], 83 y.o.   MRN: [id number]  HPI  [name] is here today for follow up of chronic medical problem.  Outpatient Encounter Medications as of 07/09/2017  Medication Sig  . albuterol (PROVENTIL HFA;VENTOLIN HFA) 108 (90 Base) MCG/ACT inhaler Inhale 2 puffs into the lungs every 6 (six) hours as needed for wheezing or shortness of breath.  [name] aspirin 81 MG tablet Take 81 mg by mouth every other day.   [name] atorvastatin (LIPITOR) 80 MG tablet Take 1 tablet (80 mg total) by mouth daily.  . budesonide-formoterol (SYMBICORT) 160-4.5 MCG/ACT inhaler Inhale 2 puffs into the lungs 2 (two) times daily.  . busPIRone (BUSPAR) 10 MG tablet Take 1 tablet (10 mg total) by mouth 3 (three) times daily.  . diphenhydrAMINE (BENADRYL) 25 MG tablet Take 25 mg by mouth daily. Take 2 at bedtime   . HYDROcodone-acetaminophen (NORCO) 5-325 MG tablet Take 1 tablet by mouth every 6 (six) hours as needed for moderate pain.  . meclizine (ANTIVERT) 12.5 MG tablet Take 1 tablet (12.5 mg total) 3 (three) times daily as needed by mouth for dizziness.  . metFORMIN (GLUCOPHAGE) 500 MG tablet TAKE 1 TABLET (500 MG TOTAL) BY MOUTH 2 (TWO) TIMES DAILY WITH A MEAL.  [name] omeprazole (PRILOSEC) 20 MG capsule Take 1 capsule (20 mg total) by mouth daily.  . tamsulosin (FLOMAX) 0.4 MG CAPS capsule Take 1 capsule (0.4 mg total) by mouth daily after supper.  . TRUE METRIX BLOOD GLUCOSE TEST test strip TEST BLOOD SUGAR  ONCE  TO TWICE DAILY   No facility-administered encounter medications on file as of 07/09/2017.     1. Hyperlipidemia with target LDL less than 100  Patient does not watch diet at all  2. Type 2 diabetes mellitus with diabetic polyneuropathy, without long-term current use of insulin ([place]) last hgba1c was 6.2%. He does not check blood sugars on a regular basis  3. Hypertensive heart disease with congestive heart failure, unspecified heart failure type ([place])  no c/o chest pain, sob, or headache. Does not check blood pressures at home. Saw cardiology in august 2018  4. Sinus bradycardia- ? symptomatic  He denies fatigue or feelsing of syncope  5. PVC's (premature ventricular contractions)  deneies feeling palpitations  6. OSA (obstructive sleep apnea) does not wear cpap he hated it so he does not use it. Says he feels rested in mornings, but he does wake up several times at night.  7. COPD with asthma ([place])  Uses symbicort daily as rx. Has not needed albuterol inhaler in awhile  8. Prostate cancer [facility])  Has not seen urology in several years- denies trouble passing water  9. BMI 29.0-29.9,adult  No recent weight changes    New complaints: None today  Social history: Lives with his wife- keeps great grandchild 1 day a week.   Review of Systems  Constitutional: Negative for activity change and appetite change.  HENT: Negative.   Eyes: Negative for pain.  Respiratory: Negative for shortness of breath.   Cardiovascular: Positive for leg swelling (ocassionally). Negative for chest pain and palpitations.  Gastrointestinal: Negative for abdominal pain.  Endocrine: Negative for polydipsia.  Genitourinary: Negative.   Skin: Negative for rash.  Neurological: Negative for dizziness, weakness and headaches.  Hematological: Does not bruise/bleed easily.  Psychiatric/Behavioral: Negative.   All other systems reviewed and are negative.      Objective:   Physical Exam  Constitutional: He is oriented to person, place, and time. He appears well-developed and well-nourished.  HENT:  Head: Normocephalic.  Right Ear: External ear normal.  Left Ear: External ear normal.  Nose: Nose normal.  Mouth/Throat: Oropharynx is clear and moist.  Eyes: EOM are normal. Pupils are equal, round, and reactive to light.  Neck: Normal range of motion. Neck supple. No JVD present. No thyromegaly present.  Cardiovascular: Normal rate, regular rhythm, normal heart  sounds and intact distal pulses. Exam reveals no gallop and no friction rub.  No murmur heard. Pulmonary/Chest: Effort normal and breath sounds normal. No respiratory distress. He has no wheezes. He has no rales. He exhibits no tenderness.  Abdominal: Soft. Bowel sounds are normal. He exhibits no mass. There is no tenderness.  Genitourinary: Prostate normal and penis normal.  Musculoskeletal: Normal range of motion. He exhibits edema (1+ edema bil lower ext).  Lymphadenopathy:    He has no cervical adenopathy.  Neurological: He is alert and oriented to person, place, and time. No cranial nerve deficit.  Skin: Skin is warm and dry.  Psychiatric: He has a normal mood and affect. His behavior is normal. Judgment and thought content normal.   BP 127/67   Pulse 65   Temp (!) 96.8 F (36 C) (Oral)   Ht '5\' 10"'$  (1.778 m)   Wt 205 lb (93 kg)   BMI 29.41 kg/m   hgba1c 6.2%     Assessment & Plan:  1. Hyperlipidemia with target LDL less than 100 Low fat diet - Lipid panel  2. Type 2 diabetes mellitus with diabetic polyneuropathy, without long-term current use of insulin (HCC) Continue to watch carbs in diet - Bayer DCA Hb A1c Waived  3. Hypertensive heart disease with congestive heart failure, unspecified heart failure type (HCC) Low sodium diet - CMP14+EGFR  4. Sinus bradycardia- ? symptomatic  5. PVC's (premature ventricular contractions)  6. OSA (obstructive sleep apnea)  7. COPD with asthma ([place]) Continue symbicort  8. Prostate cancer ([place]) No problems  9. BMI 29.0-29.9,adult Discussed diet and exercise for person with BMI >25 Will recheck weight in 3-6 months  10. Hx of colonic polyps - Ambulatory referral to Gastroenterology    Labs pending Health maintenance reviewed Diet and exercise encouraged Continue all meds Follow up  In 3 months   [place], [name]

## 2017-07-10 LAB — CMP14+EGFR
A/G RATIO: 1.7 (ref 1.2–2.2)
ALBUMIN: 3.9 g/dL (ref 3.5–4.7)
ALK PHOS: 114 IU/L (ref 39–117)
ALT: 15 IU/L (ref 0–44)
AST: 15 IU/L (ref 0–40)
BILIRUBIN TOTAL: 1.2 mg/dL (ref 0.0–1.2)
BUN / CREAT RATIO: 21 (ref 10–24)
BUN: 17 mg/dL (ref 8–27)
CHLORIDE: 102 mmol/L (ref 96–106)
CO2: 26 mmol/L (ref 20–29)
Calcium: 9.4 mg/dL (ref 8.6–10.2)
Creatinine, Ser: 0.8 mg/dL (ref 0.76–1.27)
GFR calc Af Amer: 96 mL/min/{1.73_m2} (ref >59)
GFR calc non Af Amer: 83 mL/min/{1.73_m2} (ref >59)
GLUCOSE: 121 mg/dL — AB (ref 65–99)
Globulin, Total: 2.3 g/dL (ref 1.5–4.5)
POTASSIUM: 4.4 mmol/L (ref 3.5–5.2)
SODIUM: 142 mmol/L (ref 134–144)
Total Protein: 6.2 g/dL (ref 6.0–8.5)

## 2017-07-10 LAB — LIPID PANEL
CHOL/HDL RATIO: 2.6 ratio (ref 0.0–5.0)
Cholesterol, Total: 94 mg/dL — ABNORMAL LOW (ref 100–199)
HDL: 36 mg/dL — AB (ref >39)
LDL Calculated: 43 mg/dL (ref 0–99)
Triglycerides: 75 mg/dL (ref 0–149)
VLDL CHOLESTEROL CAL: 15 mg/dL (ref 5–40)

## 2017-08-28 ENCOUNTER — Ambulatory Visit (INDEPENDENT_AMBULATORY_CARE_PROVIDER_SITE_OTHER): Payer: Medicare HMO | Admitting: Family Medicine

## 2017-08-28 ENCOUNTER — Encounter: Payer: Self-pay | Admitting: Family Medicine

## 2017-08-28 VITALS — BP 137/61 | HR 65 | Temp 97.2°F | Ht 70.0 in | Wt 210.0 lb

## 2017-08-28 DIAGNOSIS — J441 Chronic obstructive pulmonary disease with (acute) exacerbation: Secondary | ICD-10-CM

## 2017-08-28 DIAGNOSIS — J449 Chronic obstructive pulmonary disease, unspecified: Secondary | ICD-10-CM | POA: Diagnosis not present

## 2017-08-28 MED ORDER — METHYLPREDNISOLONE ACETATE 80 MG/ML IJ SUSP
80.0000 mg | Freq: Once | INTRAMUSCULAR | Status: AC
  Administered 2017-08-28: 80 mg via INTRAMUSCULAR

## 2017-08-28 MED ORDER — AMOXICILLIN-POT CLAVULANATE 875-125 MG PO TABS: 1.0000 | ORAL_TABLET | Freq: Two times a day (BID) | ORAL | 0 refills | Status: DC

## 2017-08-28 NOTE — Progress Notes (Signed)
Chief Complaint  Patient presents with  . Cough    pt here today c/o cough, congestion and "short winded"    HPI  Patient presents today for Patient presents with upper respiratory congestion. Rhinorrhea that is frequently purulent. There is moderate sore throat. Patient reports coughing frequently as well.  Thick,white sputum noted. There is no fever, chills or sweats. The patient  Winded easily - just walking to the mailbox. Onset was several days ago. Gradually worsening. Hx of COPDTried OTCs without improvement.  PMH: Smoking status noted ROS: Per HPI  Objective: BP 137/61   Pulse 65   Temp (!) 97.2 F (36.2 C) (Oral)   Ht 5\' 10"  (1.778 m)   Wt 210 lb (95.3 kg)   SpO2 97%   BMI 30.13 kg/m  Gen: NAD, alert, cooperative with exam HEENT: NCAT, Nasal passages swollen, red TMS RED CV: RRR, good S1/S2, no murmur Resp: Bronchitis changes with  Frequent wheezes, non-labored Ext: No edema, warm Neuro: Alert and oriented, No gross deficits  Assessment and plan:  1. COPD with asthma ([place])   2. Acute exacerbation of chronic obstructive pulmonary disease (COPD) ([place])     Meds ordered this encounter  Medications  . methylPREDNISolone acetate (DEPO-MEDROL) injection 80 mg  . amoxicillin-clavulanate (AUGMENTIN) 875-125 MG tablet    Sig: Take 1 tablet by mouth 2 (two) times daily.    Dispense:  20 tablet    Refill:  0    No orders of the defined types were placed in this encounter.   Follow up as needed.  Claretta Fraise, MD

## 2017-08-28 NOTE — Patient Instructions (Signed)
Use Mucinex DM (otc) for cough and phlegm.

## 2017-09-23 ENCOUNTER — Ambulatory Visit (INDEPENDENT_AMBULATORY_CARE_PROVIDER_SITE_OTHER): Payer: Medicare HMO | Admitting: Family Medicine

## 2017-09-23 ENCOUNTER — Ambulatory Visit: Admission: RE | Admit: 2017-09-23 | Payer: Self-pay | Source: Ambulatory Visit

## 2017-09-23 ENCOUNTER — Ambulatory Visit (INDEPENDENT_AMBULATORY_CARE_PROVIDER_SITE_OTHER): Payer: Medicare HMO

## 2017-09-23 ENCOUNTER — Other Ambulatory Visit: Payer: Self-pay | Admitting: Family Medicine

## 2017-09-23 ENCOUNTER — Ambulatory Visit: Payer: Medicare HMO

## 2017-09-23 ENCOUNTER — Encounter: Payer: Self-pay | Admitting: Family Medicine

## 2017-09-23 VITALS — BP 192/89 | HR 65 | Temp 96.5°F | Ht 70.0 in | Wt 210.0 lb

## 2017-09-23 DIAGNOSIS — R05 Cough: Secondary | ICD-10-CM | POA: Diagnosis not present

## 2017-09-23 DIAGNOSIS — J441 Chronic obstructive pulmonary disease with (acute) exacerbation: Secondary | ICD-10-CM | POA: Diagnosis not present

## 2017-09-23 DIAGNOSIS — J449 Chronic obstructive pulmonary disease, unspecified: Secondary | ICD-10-CM | POA: Diagnosis not present

## 2017-09-23 DIAGNOSIS — R059 Cough, unspecified: Secondary | ICD-10-CM

## 2017-09-23 DIAGNOSIS — J13 Pneumonia due to Streptococcus pneumoniae: Secondary | ICD-10-CM

## 2017-09-23 MED ORDER — BETAMETHASONE SOD PHOS & ACET 6 (3-3) MG/ML IJ SUSP
6.0000 mg | Freq: Once | INTRAMUSCULAR | Status: AC
  Administered 2017-09-23: 6 mg via INTRAMUSCULAR

## 2017-09-23 MED ORDER — ALBUTEROL SULFATE HFA 108 (90 BASE) MCG/ACT IN AERS: 2.0000 | INHALATION_SPRAY | Freq: Four times a day (QID) | RESPIRATORY_TRACT | 2 refills | Status: DC | PRN

## 2017-09-23 MED ORDER — BUDESONIDE-FORMOTEROL FUMARATE 160-4.5 MCG/ACT IN AERO: 2.0000 | INHALATION_SPRAY | Freq: Two times a day (BID) | RESPIRATORY_TRACT | 5 refills | Status: DC

## 2017-09-23 MED ORDER — LEVOFLOXACIN 500 MG PO TABS: 500.0000 mg | ORAL_TABLET | Freq: Every day | ORAL | 0 refills | Status: DC

## 2017-09-23 NOTE — Progress Notes (Signed)
Subjective:  Patient ID: [name], male    DOB: 11-19-1932  Age: 84 y.o. MRN: [id number]  CC: Wheezing   HPI [name] presents for chest congestion increasing for about 5 days.  He is also gotten short of breath.  He feels as if there is something in his chest.  He is trying to cough it up and only a little bit of it comes up.  He has been using some Mucinex DM with very little he denies fever chills and sweats.  He is having trouble laying down because it makes him short of breath.  He has a very long history of smoking with 150-pack-year history.  Depression screen [facility] 2/9 09/23/2017 08/28/2017 07/09/2017  Decreased Interest 0 0 0  Down, Depressed, Hopeless 0 0 0  PHQ - 2 Score 0 0 0  Altered sleeping - - -  Tired, decreased energy - - -  Change in appetite - - -  Feeling bad or failure about yourself  - - -  Trouble concentrating - - -  Moving slowly or fidgety/restless - - -  Suicidal thoughts - - -  PHQ-9 Score - - -    History Nyaire has a past medical history of AAA (abdominal aortic aneurysm) ([place]), Arthritis, CAD (coronary artery disease), Carotid artery disease ([place]), COPD with asthma ([place]) (02/21/2014), CVA (cerebral vascular accident) ([place]), Dizziness, Dyslipidemia, Ejection fraction, Fatigue, GERD (gastroesophageal reflux disease), History of blood transfusion (1956), HTN (hypertension), CABG, Hyperbilirubinemia, Itching, Kidney stones, OSA (obstructive sleep apnea) (12/07/2013), Pneumonia (1940's), Prostate cancer ([place]), Thrombocytopenia ([place]), Type II diabetes mellitus ([place]), and Vertigo.   He has a past surgical history that includes Abdominal aortic aneurysm repair (~ 2000); Femoral artery aneurysm repair (~ 2000); Medial partial knee replacement (Left, 2009); Umbilical hernia repair; Incisional hernia repair (09/2002); Hernia repair; Inguinal hernia repair (Left, 08/2004); ERCP w/ metal stent placement (12/2001); Cholecystectomy (12/2001); Cardiac catheterization  (07/2011); Coronary artery bypass graft (08/21/2011); Prostate biopsy (~ 2001); left heart catheterization with coronary angiogram (N/A, 08/15/2011); Cardiac catheterization (03/30/2014); and cancer removed off right side of face.   His family history includes Colon cancer in his brother; Heart attack in his brother, father, and mother; Prostate cancer in his brother.He reports that he quit smoking about 19 years ago. His smoking use included cigarettes. He has a 150.00 pack-year smoking history. He has quit using smokeless tobacco. His smokeless tobacco use included chew. He reports that he does not drink alcohol or use drugs.    ROS Review of Systems  Constitutional: Positive for fatigue. Negative for chills, diaphoresis and fever.  HENT: Negative for rhinorrhea and sore throat.   Respiratory: Positive for cough, chest tightness, shortness of breath and wheezing.   Cardiovascular: Negative for chest pain.  Gastrointestinal: Negative for abdominal pain.  Musculoskeletal: Positive for arthralgias and myalgias.  Skin: Negative for rash.  Neurological: Negative for weakness and headaches.    Objective:  BP (!) 192/89   Pulse 65   Temp (!) 96.5 F (35.8 C) (Oral)   Ht 5\' 10"  (1.778 m)   Wt 210 lb (95.3 kg)   SpO2 96% Comment: Sitting on room air.  BMI 30.13 kg/m   BP Readings from Last 3 Encounters:  09/23/17 (!) 192/89  08/28/17 137/61  07/09/17 127/67    Wt Readings from Last 3 Encounters:  09/23/17 210 lb (95.3 kg)  08/28/17 210 lb (95.3 kg)  07/09/17 205 lb (93 kg)     Physical Exam  Constitutional:  He is oriented to person, place, and time. He appears well-developed and well-nourished. No distress.  HENT:  Head: Normocephalic and atraumatic.  Right Ear: Tympanic membrane and external ear normal. No decreased hearing is noted.  Left Ear: Tympanic membrane and external ear normal. No decreased hearing is noted.  Nose: Mucosal edema present. Right sinus exhibits no  frontal sinus tenderness. Left sinus exhibits no frontal sinus tenderness.  Mouth/Throat: Oropharynx is clear and moist. No oropharyngeal exudate or posterior oropharyngeal erythema.  Eyes: Conjunctivae and EOM are normal. Pupils are equal, round, and reactive to light.  Neck: Normal range of motion. Neck supple. No Brudzinski's sign noted. No thyromegaly present.  Cardiovascular: Normal rate, regular rhythm and normal heart sounds.  No murmur heard. Pulmonary/Chest: Effort normal. No respiratory distress. He has wheezes ( coarse rhonchi and wheezing throughout all lung fields). He has rales.  Abdominal: Soft. Bowel sounds are normal. He exhibits no distension. There is no tenderness.  Lymphadenopathy:       Head (right side): No preauricular adenopathy present.       Head (left side): No preauricular adenopathy present.    He has no cervical adenopathy.       Right cervical: No superficial cervical adenopathy present.      Left cervical: No superficial cervical adenopathy present.  Neurological: He is alert and oriented to person, place, and time. He has normal reflexes.  Skin: Skin is warm and dry.  Psychiatric: He has a normal mood and affect. His behavior is normal. Judgment and thought content normal.      Assessment & Plan:   Alani was seen today for wheezing.  Diagnoses and all orders for this visit:  Cough -     DG Chest 2 View; Future  Pneumonia of right lower lobe due to Streptococcus pneumoniae (HCC)  Acute exacerbation of chronic obstructive pulmonary disease (COPD) (HCC) -     betamethasone acetate-betamethasone sodium phosphate (CELESTONE) injection 6 mg  COPD with asthma (HCC) -     budesonide-formoterol (SYMBICORT) 160-4.5 MCG/ACT inhaler; Inhale 2 puffs into the lungs 2 (two) times daily.  Other orders -     levofloxacin (LEVAQUIN) 500 MG tablet; Take 1 tablet (500 mg total) by mouth daily. For 10 days -     albuterol (PROVENTIL HFA;VENTOLIN HFA) 108 (90 Base)  MCG/ACT inhaler; Inhale 2 puffs into the lungs every 6 (six) hours as needed for wheezing or shortness of breath.       I have discontinued Stavros D. Volden's amoxicillin-clavulanate. I am also having him start on levofloxacin. Additionally, I am having him maintain his aspirin, TRUE METRIX BLOOD GLUCOSE TEST, diphenhydrAMINE, busPIRone, omeprazole, atorvastatin, metFORMIN, tamsulosin, HYDROcodone-acetaminophen, meclizine, GuaiFENesin (MUCINEX PO), budesonide-formoterol, and albuterol. We will continue to administer betamethasone acetate-betamethasone sodium phosphate.  Allergies as of 09/23/2017      Reactions   Ketorolac Tromethamine Rash      Medication List        Accurate as of 09/23/17  5:52 PM. Always use your most recent med list.          albuterol 108 (90 Base) MCG/ACT inhaler Commonly known as:  PROVENTIL HFA;VENTOLIN HFA Inhale 2 puffs into the lungs every 6 (six) hours as needed for wheezing or shortness of breath.   aspirin 81 MG tablet Take 81 mg by mouth every other day.   atorvastatin 80 MG tablet Commonly known as:  LIPITOR Take 1 tablet (80 mg total) by mouth daily.   budesonide-formoterol 160-4.5 MCG/ACT inhaler  Commonly known as:  SYMBICORT Inhale 2 puffs into the lungs 2 (two) times daily.   busPIRone 10 MG tablet Commonly known as:  BUSPAR Take 1 tablet (10 mg total) by mouth 3 (three) times daily.   diphenhydrAMINE 25 MG tablet Commonly known as:  BENADRYL Take 25 mg by mouth daily. Take 2 at bedtime   HYDROcodone-acetaminophen 5-325 MG tablet Commonly known as:  NORCO Take 1 tablet by mouth every 6 (six) hours as needed for moderate pain.   levofloxacin 500 MG tablet Commonly known as:  LEVAQUIN Take 1 tablet (500 mg total) by mouth daily. For 10 days   meclizine 12.5 MG tablet Commonly known as:  ANTIVERT Take 1 tablet (12.5 mg total) 3 (three) times daily as needed by mouth for dizziness.   metFORMIN 500 MG tablet Commonly known as:   GLUCOPHAGE TAKE 1 TABLET (500 MG TOTAL) BY MOUTH 2 (TWO) TIMES DAILY WITH A MEAL.   MUCINEX PO Take by mouth.   omeprazole 20 MG capsule Commonly known as:  PRILOSEC Take 1 capsule (20 mg total) by mouth daily.   tamsulosin 0.4 MG Caps capsule Commonly known as:  FLOMAX Take 1 capsule (0.4 mg total) by mouth daily after supper.   TRUE METRIX BLOOD GLUCOSE TEST test strip Generic drug:  glucose blood TEST BLOOD SUGAR  ONCE  TO TWICE DAILY        Follow-up: No Follow-up on file.  Claretta Fraise, M.D.

## 2017-09-28 ENCOUNTER — Ambulatory Visit: Payer: Medicare HMO | Admitting: Cardiology

## 2017-09-28 ENCOUNTER — Telehealth: Payer: Self-pay | Admitting: Cardiology

## 2017-09-28 ENCOUNTER — Encounter: Payer: Self-pay | Admitting: Cardiology

## 2017-09-28 VITALS — BP 138/72 | HR 63 | Ht 70.0 in | Wt 207.0 lb

## 2017-09-28 DIAGNOSIS — Z9889 Other specified postprocedural states: Secondary | ICD-10-CM

## 2017-09-28 DIAGNOSIS — I10 Essential (primary) hypertension: Secondary | ICD-10-CM | POA: Diagnosis not present

## 2017-09-28 DIAGNOSIS — I2510 Atherosclerotic heart disease of native coronary artery without angina pectoris: Secondary | ICD-10-CM

## 2017-09-28 DIAGNOSIS — E782 Mixed hyperlipidemia: Secondary | ICD-10-CM

## 2017-09-28 DIAGNOSIS — Z8679 Personal history of other diseases of the circulatory system: Secondary | ICD-10-CM | POA: Diagnosis not present

## 2017-09-28 NOTE — Telephone Encounter (Signed)
AAA Duplex scheduled in [facility] 10/22/17

## 2017-09-28 NOTE — Patient Instructions (Signed)
Your physician wants you to follow-up in: 6 MONTHS WITH DR BRANCH You will receive a reminder letter in the mail two months in advance. If you don't receive a letter, please call our office to schedule the follow-up appointment.  Your physician recommends that you continue on your current medications as directed. Please refer to the Current Medication list given to you today.  Your physician has requested that you have an abdominal aorta duplex. During this test, an ultrasound is used to evaluate the aorta. Allow 30 minutes for this exam. Do not eat after midnight the day before and avoid carbonated beverages  Thank you for choosing [facility]!!    
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
## 2017-10-01 ENCOUNTER — Ambulatory Visit (INDEPENDENT_AMBULATORY_CARE_PROVIDER_SITE_OTHER): Payer: Medicare HMO | Admitting: Family

## 2017-10-01 ENCOUNTER — Encounter: Payer: Self-pay | Admitting: Family

## 2017-10-01 VITALS — BP 104/61 | HR 54 | Temp 97.4°F | Ht 70.0 in | Wt 208.6 lb

## 2017-10-01 DIAGNOSIS — J449 Chronic obstructive pulmonary disease, unspecified: Secondary | ICD-10-CM | POA: Diagnosis not present

## 2017-10-01 DIAGNOSIS — J440 Chronic obstructive pulmonary disease with acute lower respiratory infection: Secondary | ICD-10-CM | POA: Diagnosis not present

## 2017-10-01 DIAGNOSIS — J209 Acute bronchitis, unspecified: Secondary | ICD-10-CM | POA: Diagnosis not present

## 2017-10-01 MED ORDER — PREDNISONE 20 MG PO TABS: ORAL_TABLET | ORAL | 0 refills | Status: DC

## 2017-10-01 NOTE — Progress Notes (Signed)
   Subjective:    Patient ID: [name], male    DOB: [birth year], 84 y.o.   MRN: [id number]  PT presents to the office today to recheck pneumonia. Pt was seen on 09/24/17 and started on Levaquin. Pt reports he has two more days left, but continues to feel bad and weak.  Cough  This is a recurrent problem. The current episode started 1 to 4 weeks ago. The problem has been unchanged. The problem occurs every few minutes. The cough is productive of sputum. Associated symptoms include headaches, rhinorrhea and wheezing. Pertinent negatives include no chills, ear congestion, ear pain, fever, myalgias or sore throat. The symptoms are aggravated by lying down. He has tried rest, OTC cough suppressant and OTC inhaler for the symptoms. The treatment provided mild relief. His past medical history is significant for COPD.      Review of Systems  Constitutional: Negative for chills and fever.  HENT: Positive for rhinorrhea. Negative for ear pain and sore throat.   Respiratory: Positive for cough and wheezing.   Musculoskeletal: Negative for myalgias.  Neurological: Positive for headaches.  All other systems reviewed and are negative.      Objective:   Physical Exam  Constitutional: He is oriented to person, place, and time. He appears well-developed and well-nourished. No distress.  HENT:  Head: Normocephalic.  Right Ear: External ear normal.  Left Ear: External ear normal.  Mouth/Throat: Oropharynx is clear and moist.  Eyes: Pupils are equal, round, and reactive to light. Right eye exhibits no discharge. Left eye exhibits no discharge.  Neck: Normal range of motion. Neck supple. No thyromegaly present.  Cardiovascular: Normal rate, regular rhythm, normal heart sounds and intact distal pulses.  No murmur heard. Pulmonary/Chest: Effort normal. No respiratory distress. He has rhonchi.  Abdominal: Soft. Bowel sounds are normal. He exhibits no distension. There is no tenderness.    Musculoskeletal: Normal range of motion. He exhibits no edema or tenderness.  Neurological: He is alert and oriented to person, place, and time.  Skin: Skin is warm and dry. No rash noted. No erythema.  Psychiatric: He has a normal mood and affect. His behavior is normal. Judgment and thought content normal.  Vitals reviewed.     BP 104/61   Pulse (!) 54   Temp (!) 97.4 F (36.3 C) (Oral)   Ht 5\' 10"  (1.778 m)   Wt 208 lb 9.6 oz (94.6 kg)   SpO2 97%   BMI 29.93 kg/m      Assessment & Plan:  1. COPD with asthma ([place]) - predniSONE (DELTASONE) 20 MG tablet; Take 3 tabs daily for 1 week, then 2 tabs for 1 week, then 1 tab for one week  Dispense: 42 tablet; Refill: 0  2. Acute bronchitis with COPD ([place]) Continue Symbicort and Levaquin Will start Prednisone, strict low carb diet Rest Force fluids RTO prn  - predniSONE (DELTASONE) 20 MG tablet; Take 3 tabs daily for 1 week, then 2 tabs for 1 week, then 1 tab for one week  Dispense: 42 tablet; Refill: 0    Evelina Dun, [name]

## 2017-10-01 NOTE — Patient Instructions (Signed)

## 2017-10-02 ENCOUNTER — Encounter: Payer: Self-pay | Admitting: Cardiology

## 2017-10-07 ENCOUNTER — Ambulatory Visit (INDEPENDENT_AMBULATORY_CARE_PROVIDER_SITE_OTHER): Payer: Medicare HMO | Admitting: *Deleted

## 2017-10-07 DIAGNOSIS — Z0000 Encounter for general adult medical examination without abnormal findings: Secondary | ICD-10-CM
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
## 2017-10-07 NOTE — Patient Instructions (Addendum)
Please review information given on Advanced Directives and if you complete , please bring our office a copy to file in your chart. these  Please keep follow up appointments with Mary Margaret and Dr. Branch in the next couple of weeks.  Make a list of the things you want to talk with Mary Margaret about, and bring with you to your appointment next week.  Thank you for coming in for your Annual Wellness Visit today, it was nice meeting you!  Preventive Care 82 Years and Older, Male Preventive care refers to lifestyle choices and visits with your health care provider that can promote health and wellness. What does preventive care include?  A yearly physical exam. This is also called an annual well check.  Dental exams once or twice a year.  Routine eye exams. Ask your health care provider how often you should have your eyes checked.  Personal lifestyle choices, including: ? Daily care of your teeth and gums. ? Regular physical activity. ? Eating a healthy diet. ? Avoiding tobacco and drug use. ? Limiting alcohol use. ? Practicing safe sex. ? Taking low doses of aspirin every day. ? Taking vitamin and mineral supplements as recommended by your health care provider. What happens during an annual well check? The services and screenings done by your health care provider during your annual well check will depend on your age, overall health, lifestyle risk factors, and family history of disease. Counseling Your health care provider may ask you questions about your:  Alcohol use.  Tobacco use.  Drug use.  Emotional well-being.  Home and relationship well-being.  Sexual activity.  Eating habits.  History of falls.  Memory and ability to understand (cognition).  Work and work environment.  Screening You may have the following tests or measurements:  Height, weight, and BMI.  Blood pressure.  Lipid and cholesterol levels. These may be checked every 5 years, or more  frequently if you are over 50 years old.  Skin check.  Lung cancer screening. You may have this screening every year starting at age 55 if you have a 30-pack-year history of smoking and currently smoke or have quit within the past 15 years.  Fecal occult blood test (FOBT) of the stool. You may have this test every year starting at age 50.  Flexible sigmoidoscopy or colonoscopy. You may have a sigmoidoscopy every 5 years or a colonoscopy every 10 years starting at age 50.  Prostate cancer screening. Recommendations will vary depending on your family history and other risks.  Hepatitis C blood test.  Hepatitis B blood test.  Sexually transmitted disease (STD) testing.  Diabetes screening. This is done by checking your blood sugar (glucose) after you have not eaten for a while (fasting). You may have this done every 1-3 years.  Abdominal aortic aneurysm (AAA) screening. You may need this if you are a current or former smoker.  Osteoporosis. You may be screened starting at age 70 if you are at high risk.  Talk with your health care provider about your test results, treatment options, and if necessary, the need for more tests. Vaccines Your health care provider may recommend certain vaccines, such as:  Influenza vaccine. This is recommended every year.  Tetanus, diphtheria, and acellular pertussis (Tdap, Td) vaccine. You may need a Td booster every 10 years.  Varicella vaccine. You may need this if you have not been vaccinated.  Zoster vaccine. You may need this after age 60.  Measles, mumps, and rubella (MMR) vaccine.   You may need at least one dose of MMR if you were born in 1957 or later. You may also need a second dose.  Pneumococcal 13-valent conjugate (PCV13) vaccine. One dose is recommended after age 4.  Pneumococcal polysaccharide (PPSV23) vaccine. One dose is recommended after age 75.  Meningococcal vaccine. You may need this if you have certain conditions.  Hepatitis  A vaccine. You may need this if you have certain conditions or if you travel or work in places where you may be exposed to hepatitis A.  Hepatitis B vaccine. You may need this if you have certain conditions or if you travel or work in places where you may be exposed to hepatitis B.  Haemophilus influenzae type b (Hib) vaccine. You may need this if you have certain risk factors.  Talk to your health care provider about which screenings and vaccines you need and how often you need them. This information is not intended to replace advice given to you by your health care provider. Make sure you discuss any questions you have with your health care provider. Document Released: 09/07/2015 Document Revised: 04/30/2016 Document Reviewed: 06/12/2015 Elsevier Interactive Patient Education  2018 [place] in the Home Falls can cause injuries. They can happen to people of all ages. There are many things you can do to make your home safe and to help prevent falls. What can I do on the outside of my home?  Regularly fix the edges of walkways and driveways and fix any cracks.  Remove anything that might make you trip as you walk through a door, such as a raised step or threshold.  Trim any bushes or trees on the path to your home.  Use bright outdoor lighting.  Clear any walking paths of anything that might make someone trip, such as rocks or tools.  Regularly check to see if handrails are loose or broken. Make sure that both sides of any steps have handrails.  Any raised decks and porches should have guardrails on the edges.  Have any leaves, snow, or ice cleared regularly.  Use sand or salt on walking paths during winter.  Clean up any spills in your garage right away. This includes oil or grease spills. What can I do in the bathroom?  Use night lights.  Install grab bars by the toilet and in the tub and shower. Do not use towel bars as grab bars.  Use non-skid mats or  decals in the tub or shower.  If you need to sit down in the shower, use a plastic, non-slip stool.  Keep the floor dry. Clean up any water that spills on the floor as soon as it happens.  Remove soap buildup in the tub or shower regularly.  Attach bath mats securely with double-sided non-slip rug tape.  Do not have throw rugs and other things on the floor that can make you trip. What can I do in the bedroom?  Use night lights.  Make sure that you have a light by your bed that is easy to reach.  Do not use any sheets or blankets that are too big for your bed. They should not hang down onto the floor.  Have a firm chair that has side arms. You can use this for support while you get dressed.  Do not have throw rugs and other things on the floor that can make you trip. What can I do in the kitchen?  Clean up any spills right away.  Avoid  walking on wet floors.  Keep items that you use a lot in easy-to-reach places.  If you need to reach something above you, use a strong step stool that has a grab bar.  Keep electrical cords out of the way.  Do not use floor polish or wax that makes floors slippery. If you must use wax, use non-skid floor wax.  Do not have throw rugs and other things on the floor that can make you trip. What can I do with my stairs?  Do not leave any items on the stairs.  Make sure that there are handrails on both sides of the stairs and use them. Fix handrails that are broken or loose. Make sure that handrails are as long as the stairways.  Check any carpeting to make sure that it is firmly attached to the stairs. Fix any carpet that is loose or worn.  Avoid having throw rugs at the top or bottom of the stairs. If you do have throw rugs, attach them to the floor with carpet tape.  Make sure that you have a light switch at the top of the stairs and the bottom of the stairs. If you do not have them, ask someone to add them for you. What else can I do to  help prevent falls?  Wear shoes that: ? Do not have high heels. ? Have rubber Sisk. ? Are comfortable and fit you well. ? Are closed at the toe. Do not wear sandals.  If you use a stepladder: ? Make sure that it is fully opened. Do not climb a closed stepladder. ? Make sure that both sides of the stepladder are locked into place. ? Ask someone to hold it for you, if possible.  Clearly mark and make sure that you can see: ? Any grab bars or handrails. ? First and last steps. ? Where the edge of each step is.  Use tools that help you move around (mobility aids) if they are needed. These include: ? Canes. ? Walkers. ? Scooters. ? Crutches.  Turn on the lights when you go into a dark area. Replace any light bulbs as soon as they burn out.  Set up your furniture so you have a clear path. Avoid moving your furniture around.  If any of your floors are uneven, fix them.  If there are any pets around you, be aware of where they are.  Review your medicines with your doctor. Some medicines can make you feel dizzy. This can increase your chance of falling. Ask your doctor what other things that you can do to help prevent falls. This information is not intended to replace advice given to you by your health care provider. Make sure you discuss any questions you have with your health care provider. Document Released: 06/07/2009 Document Revised: 01/17/2016 Document Reviewed: 09/15/2014 Elsevier Interactive Patient Education  [employer].

## 2017-10-10 ENCOUNTER — Emergency Department (HOSPITAL_COMMUNITY)
Admission: EM | Admit: 2017-10-10 | Discharge: 2017-10-11 | Disposition: A | Discharge status: Home / Self Care | Payer: Medicare HMO | Attending: Emergency Medicine | Admitting: Emergency Medicine

## 2017-10-10 ENCOUNTER — Other Ambulatory Visit: Payer: Self-pay

## 2017-10-10 ENCOUNTER — Encounter (HOSPITAL_COMMUNITY): Payer: Self-pay

## 2017-10-10 ENCOUNTER — Emergency Department (HOSPITAL_COMMUNITY): Payer: Medicare HMO

## 2017-10-10 DIAGNOSIS — Z79899 Other long term (current) drug therapy: Secondary | ICD-10-CM | POA: Insufficient documentation

## 2017-10-10 DIAGNOSIS — N23 Unspecified renal colic: Secondary | ICD-10-CM | POA: Diagnosis not present

## 2017-10-10 DIAGNOSIS — R1084 Generalized abdominal pain: Secondary | ICD-10-CM | POA: Diagnosis not present

## 2017-10-10 DIAGNOSIS — Z87891 Personal history of nicotine dependence: Secondary | ICD-10-CM | POA: Insufficient documentation

## 2017-10-10 DIAGNOSIS — Z7982 Long term (current) use of aspirin: Secondary | ICD-10-CM | POA: Diagnosis not present

## 2017-10-10 DIAGNOSIS — R1012 Left upper quadrant pain: Secondary | ICD-10-CM | POA: Diagnosis present

## 2017-10-10 DIAGNOSIS — J449 Chronic obstructive pulmonary disease, unspecified: Secondary | ICD-10-CM | POA: Insufficient documentation

## 2017-10-10 DIAGNOSIS — I10 Essential (primary) hypertension: Secondary | ICD-10-CM | POA: Insufficient documentation

## 2017-10-10 DIAGNOSIS — Z8546 Personal history of malignant neoplasm of prostate: Secondary | ICD-10-CM | POA: Insufficient documentation

## 2017-10-10 DIAGNOSIS — Z951 Presence of aortocoronary bypass graft: Secondary | ICD-10-CM | POA: Diagnosis not present

## 2017-10-10 DIAGNOSIS — D696 Thrombocytopenia, unspecified: Secondary | ICD-10-CM

## 2017-10-10 DIAGNOSIS — R109 Unspecified abdominal pain: Secondary | ICD-10-CM

## 2017-10-10 DIAGNOSIS — N200 Calculus of kidney: Secondary | ICD-10-CM | POA: Diagnosis not present

## 2017-10-10 DIAGNOSIS — Z7984 Long term (current) use of oral hypoglycemic drugs: Secondary | ICD-10-CM | POA: Insufficient documentation

## 2017-10-10 DIAGNOSIS — I2510 Atherosclerotic heart disease of native coronary artery without angina pectoris: Secondary | ICD-10-CM | POA: Diagnosis not present

## 2017-10-10 DIAGNOSIS — E119 Type 2 diabetes mellitus without complications: Secondary | ICD-10-CM | POA: Diagnosis not present

## 2017-10-10 DIAGNOSIS — R1110 Vomiting, unspecified: Secondary | ICD-10-CM | POA: Diagnosis not present

## 2017-10-10 LAB — CBC WITH DIFFERENTIAL/PLATELET
BASOS PCT: 0 %
Basophils Absolute: 0 10*3/uL (ref 0.0–0.1)
Eosinophils Absolute: 0 10*3/uL (ref 0.0–0.7)
Eosinophils Relative: 1 %
HCT: 40.8 % (ref 39.0–52.0)
HEMOGLOBIN: 13.1 g/dL (ref 13.0–17.0)
Lymphocytes Relative: 6 %
Lymphs Abs: 0.4 10*3/uL — ABNORMAL LOW (ref 0.7–4.0)
MCH: 31 pg (ref 26.0–34.0)
MCHC: 32.1 g/dL (ref 30.0–36.0)
MCV: 96.5 fL (ref 78.0–100.0)
MONOS PCT: 5 %
Monocytes Absolute: 0.3 10*3/uL (ref 0.1–1.0)
NEUTROS PCT: 88 %
Neutro Abs: 5.7 10*3/uL (ref 1.7–7.7)
Platelets: 76 10*3/uL — ABNORMAL LOW (ref 150–400)
RBC: 4.23 MIL/uL (ref 4.22–5.81)
RDW: 16.1 % — ABNORMAL HIGH (ref 11.5–15.5)
WBC: 6.5 10*3/uL (ref 4.0–10.5)

## 2017-10-10 LAB — CBG MONITORING, ED: Glucose-Capillary: 337 mg/dL — ABNORMAL HIGH (ref 65–99)

## 2017-10-10 LAB — BASIC METABOLIC PANEL
Anion gap: 8 (ref 5–15)
BUN: 26 mg/dL — ABNORMAL HIGH (ref 6–20)
CALCIUM: 8.7 mg/dL — AB (ref 8.9–10.3)
CHLORIDE: 105 mmol/L (ref 101–111)
CO2: 28 mmol/L (ref 22–32)
CREATININE: 0.97 mg/dL (ref 0.61–1.24)
GFR calc non Af Amer: >60 mL/min (ref >60)
Glucose, Bld: 382 mg/dL — ABNORMAL HIGH (ref 65–99)
Potassium: 4.7 mmol/L (ref 3.5–5.1)
SODIUM: 141 mmol/L (ref 135–145)

## 2017-10-10 LAB — URINALYSIS, ROUTINE W REFLEX MICROSCOPIC
BILIRUBIN URINE: NEGATIVE
Bacteria, UA: NONE SEEN
KETONES UR: NEGATIVE mg/dL
LEUKOCYTES UA: NEGATIVE
NITRITE: NEGATIVE
PROTEIN: NEGATIVE mg/dL
Specific Gravity, Urine: 1.017 (ref 1.005–1.030)
WBC UA: NONE SEEN WBC/hpf (ref 0–5)
pH: 7 (ref 5.0–8.0)

## 2017-10-10 MED ORDER — NAPROXEN 250 MG PO TABS
375.0000 mg | ORAL_TABLET | Freq: Once | ORAL | Status: AC
  Administered 2017-10-11: 375 mg via ORAL
  Filled 2017-10-10: qty 2

## 2017-10-10 MED ORDER — ACETAMINOPHEN 325 MG PO TABS
650.0000 mg | ORAL_TABLET | Freq: Once | ORAL | Status: AC
  Administered 2017-10-11: 650 mg via ORAL
  Filled 2017-10-10: qty 2

## 2017-10-10 MED ORDER — NAPROXEN 375 MG PO TABS: 375.0000 mg | ORAL_TABLET | Freq: Two times a day (BID) | ORAL | 0 refills | Status: DC

## 2017-10-10 MED ORDER — ACETAMINOPHEN ER 650 MG PO TBCR: 650.0000 mg | EXTENDED_RELEASE_TABLET | Freq: Three times a day (TID) | ORAL | 0 refills | Status: DC

## 2017-10-10 MED ORDER — ONDANSETRON 8 MG PO TBDP
8.0000 mg | ORAL_TABLET | Freq: Once | ORAL | Status: AC
  Administered 2017-10-11: 8 mg via ORAL
  Filled 2017-10-10: qty 1

## 2017-10-10 NOTE — ED Notes (Signed)
Pt returned from Radiology.
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
## 2017-10-10 NOTE — Discharge Instructions (Addendum)
We saw you in the ER for the abdominal pain. Our results indicate that you have a kidney stone. We were able to get your pain is relative control, and we can safely send you home.  Take the meds prescribed. Set up an appointment with the Urologist. If the pain is unbearable, you start having fevers, chills, and are unable to keep any meds down - then return to the ER.  Please see her primary care doctor for repeat platelet evaluation. Return to the emergency room immediately if you start having severe headaches, or bleeding.

## 2017-10-10 NOTE — ED Triage Notes (Signed)
Pt arrived via [facility] from home c/o left sided flank pain. Pt states he has a history of kidney stones.

## 2017-10-10 NOTE — ED Notes (Signed)
Patient transported to X-ray 

## 2017-10-11 DIAGNOSIS — Z79899 Other long term (current) drug therapy: Secondary | ICD-10-CM | POA: Diagnosis not present

## 2017-10-11 DIAGNOSIS — Z951 Presence of aortocoronary bypass graft: Secondary | ICD-10-CM | POA: Diagnosis not present

## 2017-10-11 DIAGNOSIS — Z7982 Long term (current) use of aspirin: Secondary | ICD-10-CM | POA: Diagnosis not present

## 2017-10-11 DIAGNOSIS — E119 Type 2 diabetes mellitus without complications: Secondary | ICD-10-CM | POA: Diagnosis not present

## 2017-10-11 DIAGNOSIS — N23 Unspecified renal colic: Secondary | ICD-10-CM | POA: Diagnosis not present

## 2017-10-11 DIAGNOSIS — Z7984 Long term (current) use of oral hypoglycemic drugs: Secondary | ICD-10-CM | POA: Diagnosis not present

## 2017-10-11 DIAGNOSIS — J449 Chronic obstructive pulmonary disease, unspecified: Secondary | ICD-10-CM | POA: Diagnosis not present

## 2017-10-11 DIAGNOSIS — Z8546 Personal history of malignant neoplasm of prostate: Secondary | ICD-10-CM | POA: Diagnosis not present

## 2017-10-11 DIAGNOSIS — I2510 Atherosclerotic heart disease of native coronary artery without angina pectoris: Secondary | ICD-10-CM | POA: Diagnosis not present

## 2017-10-11 DIAGNOSIS — I10 Essential (primary) hypertension: Secondary | ICD-10-CM | POA: Diagnosis not present

## 2017-10-15 ENCOUNTER — Ambulatory Visit (INDEPENDENT_AMBULATORY_CARE_PROVIDER_SITE_OTHER): Payer: Medicare HMO | Admitting: Nurse Practitioner

## 2017-10-15 ENCOUNTER — Encounter: Payer: Self-pay | Admitting: Nurse Practitioner

## 2017-10-15 VITALS — BP 136/72 | HR 69 | Temp 96.8°F | Ht 70.0 in | Wt 210.0 lb

## 2017-10-15 DIAGNOSIS — F411 Generalized anxiety disorder: Secondary | ICD-10-CM | POA: Diagnosis not present

## 2017-10-15 DIAGNOSIS — D696 Thrombocytopenia, unspecified: Secondary | ICD-10-CM | POA: Diagnosis not present

## 2017-10-15 DIAGNOSIS — I110 Hypertensive heart disease with heart failure: Secondary | ICD-10-CM

## 2017-10-15 DIAGNOSIS — E1142 Type 2 diabetes mellitus with diabetic polyneuropathy: Secondary | ICD-10-CM

## 2017-10-15 DIAGNOSIS — G4733 Obstructive sleep apnea (adult) (pediatric): Secondary | ICD-10-CM

## 2017-10-15 DIAGNOSIS — Z6829 Body mass index (BMI) 29.0-29.9, adult: Secondary | ICD-10-CM

## 2017-10-15 DIAGNOSIS — J449 Chronic obstructive pulmonary disease, unspecified: Secondary | ICD-10-CM | POA: Diagnosis not present

## 2017-10-15 DIAGNOSIS — K219 Gastro-esophageal reflux disease without esophagitis: Secondary | ICD-10-CM | POA: Diagnosis not present

## 2017-10-15 DIAGNOSIS — Z125 Encounter for screening for malignant neoplasm of prostate: Secondary | ICD-10-CM | POA: Diagnosis not present

## 2017-10-15 DIAGNOSIS — E785 Hyperlipidemia, unspecified: Secondary | ICD-10-CM | POA: Diagnosis not present

## 2017-10-15 DIAGNOSIS — N3946 Mixed incontinence: Secondary | ICD-10-CM

## 2017-10-15 DIAGNOSIS — I25810 Atherosclerosis of coronary artery bypass graft(s) without angina pectoris: Secondary | ICD-10-CM

## 2017-10-15 DIAGNOSIS — C61 Malignant neoplasm of prostate: Secondary | ICD-10-CM

## 2017-10-15 LAB — BAYER DCA HB A1C WAIVED: HB A1C (BAYER DCA - WAIVED): 7 % — ABNORMAL HIGH (ref <7.0)

## 2017-10-15 MED ORDER — OMEPRAZOLE 20 MG PO CPDR: 20.0000 mg | DELAYED_RELEASE_CAPSULE | Freq: Every day | ORAL | 1 refills | Status: DC

## 2017-10-15 MED ORDER — TAMSULOSIN HCL 0.4 MG PO CAPS
0.4000 mg | ORAL_CAPSULE | Freq: Every day | ORAL | 1 refills | Status: DC
Start: 2017-10-15 — End: 2018-02-12

## 2017-10-15 MED ORDER — ATORVASTATIN CALCIUM 80 MG PO TABS
80.0000 mg | ORAL_TABLET | Freq: Every day | ORAL | 1 refills | Status: DC
Start: 2017-10-15 — End: 2018-01-15

## 2017-10-15 MED ORDER — METFORMIN HCL 500 MG PO TABS: ORAL_TABLET | ORAL | 1 refills | Status: DC

## 2017-10-15 NOTE — Patient Instructions (Signed)

## 2017-10-15 NOTE — Progress Notes (Signed)
 Subjective:    Patient ID: [name], male    DOB: 10/02/1932, 84 y.o.   MRN: [id number]  HPI  [name] is here today for follow up of chronic medical problem.  Outpatient Encounter Medications as of 10/15/2017  Medication Sig  . acetaminophen (TYLENOL 8 HOUR) 650 MG CR tablet Take 1 tablet (650 mg total) by mouth every 8 (eight) hours.  . albuterol (PROVENTIL HFA;VENTOLIN HFA) 108 (90 Base) MCG/ACT inhaler Inhale 2 puffs into the lungs every 6 (six) hours as needed for wheezing or shortness of breath.  . aspirin 81 MG tablet Take 81 mg by mouth every other day.   . atorvastatin (LIPITOR) 80 MG tablet Take 1 tablet (80 mg total) by mouth daily.  . budesonide-formoterol (SYMBICORT) 160-4.5 MCG/ACT inhaler Inhale 2 puffs into the lungs 2 (two) times daily.  . busPIRone (BUSPAR) 10 MG tablet Take 1 tablet (10 mg total) by mouth 3 (three) times daily.  . guaiFENesin (MUCINEX) 600 MG 12 hr tablet Take 1 tablet by mouth daily.   . levofloxacin (LEVAQUIN) 500 MG tablet Take 1 tablet (500 mg total) by mouth daily. For 10 days (Patient not taking: Reported on 10/07/2017)  . meclizine (ANTIVERT) 12.5 MG tablet Take 1 tablet (12.5 mg total) 3 (three) times daily as needed by mouth for dizziness.  . metFORMIN (GLUCOPHAGE) 500 MG tablet TAKE 1 TABLET (500 MG TOTAL) BY MOUTH 2 (TWO) TIMES DAILY WITH A MEAL.  . omeprazole (PRILOSEC) 20 MG capsule Take 1 capsule (20 mg total) by mouth daily.  . predniSONE (DELTASONE) 20 MG tablet Take 3 tabs daily for 1 week, then 2 tabs for 1 week, then 1 tab for one week (Patient taking differently: Take 20-60 mg by mouth See admin instructions. Take 3 tabs daily for 1 week, then 2 tabs for 1 week, then 1 tab for one week)  . tamsulosin (FLOMAX) 0.4 MG CAPS capsule Take 1 capsule (0.4 mg total) by mouth daily after supper. (Patient taking differently: Take 0.4 mg by mouth daily after supper. Prostate)  . TRUE METRIX BLOOD GLUCOSE TEST test strip TEST BLOOD SUGAR   ONCE  TO TWICE DAILY     1. Coronary artery disease involving nonautologous biological coronary bypass graft without angina pectoris  Patient saw cardiology on 09/28/17. No changes were made to current care according to office note  2. COPD with asthma ([facility])  Patient is on symbicort daily. Says that he is doing well. No cough or SOB  3. OSA (obstructive sleep apnea) refuses to sleep with CPAP- he actually sent his machine back.  4. Type 2 diabetes mellitus with diabetic polyneuropathy, unspecified whether long term insulin use (HCC) last hgba1c is 6.2%. Does not check blood sugars at home.  5. Prostate cancer (HCC) has not seen urology as of late. denies any bleeding problems  6. BMI 29.0-29.9,adult  No recent weight changes  7. Hyperlipidemia with target LDL less than 100  Wife tries not to cook a lot of fried foods  8. Thrombocytopenia (HCC) platlets were low in hospital last week- will recheck labs today  9. Hypertensive heart disease with congestive heart failure, unspecified heart failure type (HCC) no c/o chest pain, sob or headache. Does not check blood pressure at home.    New complaints: He went to ER Saturday night and ws dx with kidney stone. He passed it last night at home and is much better with clear urine this morning. While at hospital labs were   done and his platlets were low.  Social history: Lives with wife- walks with cane.    Review of Systems  Constitutional: Negative for activity change and appetite change.  HENT: Negative.   Eyes: Negative.  Negative for pain.  Respiratory: Negative for shortness of breath.   Cardiovascular: Positive for leg swelling. Negative for chest pain and palpitations.  Gastrointestinal: Negative for abdominal pain.  Endocrine: Negative for polydipsia.  Genitourinary: Negative.   Skin: Negative for rash.  Neurological: Negative for dizziness, weakness and headaches.  Hematological: Does not bruise/bleed easily.    Psychiatric/Behavioral: Negative.   All other systems reviewed and are negative.      Objective:   Physical Exam  Constitutional: He is oriented to person, place, and time. He appears well-developed and well-nourished.  HENT:  Head: Normocephalic.  Right Ear: External ear normal.  Left Ear: External ear normal.  Nose: Nose normal.  Mouth/Throat: Oropharynx is clear and moist.  Eyes: EOM are normal. Pupils are equal, round, and reactive to light.  Neck: Normal range of motion. Neck supple. No JVD present. No thyromegaly present.  Cardiovascular: Normal rate, regular rhythm, normal heart sounds and intact distal pulses. Exam reveals no gallop and no friction rub.  No murmur heard. Pulmonary/Chest: Effort normal and breath sounds normal. No respiratory distress. He has no wheezes. He has no rales. He exhibits no tenderness.  Abdominal: Soft. Bowel sounds are normal. He exhibits no mass. There is no tenderness.  Genitourinary: Prostate normal and penis normal.  Musculoskeletal: Normal range of motion. He exhibits no edema.  Lymphadenopathy:    He has no cervical adenopathy.  Neurological: He is alert and oriented to person, place, and time. No cranial nerve deficit.  Skin: Skin is warm and dry.  Psychiatric: He has a normal mood and affect. His behavior is normal. Judgment and thought content normal.   BP 136/72   Pulse 69   Temp (!) 96.8 F (36 C) (Oral)   Ht 5' 10" (1.778 m)   Wt 210 lb (95.3 kg)   SpO2 99%   BMI 30.13 kg/m   hgba1c 7.0%      Assessment & Plan:  1. Coronary artery disease involving nonautologous biological coronary bypass graft without angina pectoris Keep follow up appointment with cardiologt  2. COPD with asthma ([place]) Continue symbicort Albuterol only as needed  3. OSA (obstructive sleep apnea) Refuses CPAP  4. Type 2 diabetes mellitus with diabetic polyneuropathy, without long-term current use of insulin (HCC) - metFORMIN (GLUCOPHAGE) 500 MG  tablet; TAKE 1 TABLET (500 MG TOTAL) BY MOUTH 2 (TWO) TIMES DAILY WITH A MEAL.  Dispense: 180 tablet; Refill: 1 Continue to watch carbs in diet - Bayer DCA Hb A1c Waived  5. Prostate cancer ([place]) No problems  6. BMI 29.0-29.9,adult Discussed diet and exercise for person with BMI >25 Will recheck weight in 3-6 months  7. Hyperlipidemia with target LDL less than 100 Continue low fat diet - Lipid panel - atorvastatin (LIPITOR) 80 MG tablet; Take 1 tablet (80 mg total) by mouth daily.  Dispense: 90 tablet; Refill: 1  8. Thrombocytopenia ([place]) Labs pending - CBC with Differential/Platelet  9. Hypertensive heart disease with congestive heart failure, unspecified heart failure type ([place]) - CMP14+EGFR  10. Screening for prostate cancer Has history of prostate cancer - PSA, total and free  11. GAD (generalized anxiety disorder) Stress management  12. Gastroesophageal reflux disease without esophagitis Avoid spicy foods Do not eat 2 hours prior to bedtime - omeprazole (  PRILOSEC) 20 MG capsule; Take 1 capsule (20 mg total) by mouth daily.  Dispense: 90 capsule; Refill: 1  13. Mixed incontinence - tamsulosin (FLOMAX) 0.4 MG CAPS capsule; Take 1 capsule (0.4 mg total) by mouth daily after supper.  Dispense: 90 capsule; Refill: 1    Labs pending Health maintenance reviewed Diet and exercise encouraged Continue all meds Follow up  In 3 months   [name] , [name]   

## 2017-10-16 ENCOUNTER — Telehealth: Payer: Self-pay | Admitting: *Deleted

## 2017-10-16 ENCOUNTER — Other Ambulatory Visit: Payer: Self-pay | Admitting: Nurse Practitioner

## 2017-10-16 DIAGNOSIS — D696 Thrombocytopenia, unspecified: Secondary | ICD-10-CM

## 2017-10-16 DIAGNOSIS — R7989 Other specified abnormal findings of blood chemistry: Secondary | ICD-10-CM

## 2017-10-16 LAB — CBC WITH DIFFERENTIAL/PLATELET
BASOS: 1 %
Basophils Absolute: 0 10*3/uL (ref 0.0–0.2)
EOS (ABSOLUTE): 0 10*3/uL (ref 0.0–0.4)
EOS: 0 %
HEMATOCRIT: 39.9 % (ref 37.5–51.0)
HEMOGLOBIN: 13.7 g/dL (ref 13.0–17.7)
IMMATURE GRANS (ABS): 0.2 10*3/uL — AB (ref 0.0–0.1)
Immature Granulocytes: 2 %
LYMPHS: 6 %
Lymphocytes Absolute: 0.5 10*3/uL — ABNORMAL LOW (ref 0.7–3.1)
MCH: 31.6 pg (ref 26.6–33.0)
MCHC: 34.3 g/dL (ref 31.5–35.7)
MCV: 92 fL (ref 79–97)
MONOCYTES: 6 %
Monocytes Absolute: 0.5 10*3/uL (ref 0.1–0.9)
NEUTROS ABS: 7.4 10*3/uL — AB (ref 1.4–7.0)
Neutrophils: 85 %
PLATELETS: 94 10*3/uL — AB (ref 150–379)
RBC: 4.34 x10E6/uL (ref 4.14–5.80)
RDW: 16.5 % — ABNORMAL HIGH (ref 12.3–15.4)
WBC: 8.4 10*3/uL (ref 3.4–10.8)

## 2017-10-16 LAB — CMP14+EGFR
A/G RATIO: 2 (ref 1.2–2.2)
ALBUMIN: 4 g/dL (ref 3.5–4.7)
ALT: 28 IU/L (ref 0–44)
AST: 13 IU/L (ref 0–40)
Alkaline Phosphatase: 63 IU/L (ref 39–117)
BUN / CREAT RATIO: 20 (ref 10–24)
BUN: 14 mg/dL (ref 8–27)
Bilirubin Total: 1.1 mg/dL (ref 0.0–1.2)
CO2: 27 mmol/L (ref 20–29)
CREATININE: 0.7 mg/dL — AB (ref 0.76–1.27)
Calcium: 9.2 mg/dL (ref 8.6–10.2)
Chloride: 102 mmol/L (ref 96–106)
GFR calc Af Amer: 102 mL/min/{1.73_m2} (ref >59)
GFR, EST NON AFRICAN AMERICAN: 88 mL/min/{1.73_m2} (ref >59)
GLOBULIN, TOTAL: 2 g/dL (ref 1.5–4.5)
Glucose: 204 mg/dL — ABNORMAL HIGH (ref 65–99)
POTASSIUM: 4.4 mmol/L (ref 3.5–5.2)
SODIUM: 142 mmol/L (ref 134–144)
Total Protein: 6 g/dL (ref 6.0–8.5)

## 2017-10-16 LAB — PSA, TOTAL AND FREE
PROSTATE SPECIFIC AG, SERUM: 4.8 ng/mL — AB (ref 0.0–4.0)
PSA FREE: 0.28 ng/mL
PSA, Free Pct: 5.8 %

## 2017-10-16 LAB — LIPID PANEL
CHOL/HDL RATIO: 2.2 ratio (ref 0.0–5.0)
Cholesterol, Total: 125 mg/dL (ref 100–199)
HDL: 58 mg/dL (ref >39)
LDL CALC: 48 mg/dL (ref 0–99)
Triglycerides: 93 mg/dL (ref 0–149)
VLDL Cholesterol Cal: 19 mg/dL (ref 5–40)

## 2017-10-16 MED ORDER — ONETOUCH ULTRASOFT LANCETS MISC: 3 refills | Status: DC

## 2017-10-16 MED ORDER — GLUCOSE BLOOD VI STRP: ORAL_STRIP | 3 refills | Status: DC

## 2017-10-16 NOTE — Telephone Encounter (Signed)
Pt ordered a free meter through insurance needs strips & lancets Rx for Onetouch Verio strips & lancets sent to [facility] order pharmacy

## 2017-10-16 NOTE — Progress Notes (Signed)
Referral made to hematology

## 2017-10-19 DIAGNOSIS — R69 Illness, unspecified: Secondary | ICD-10-CM | POA: Diagnosis not present

## 2017-10-22 ENCOUNTER — Ambulatory Visit (INDEPENDENT_AMBULATORY_CARE_PROVIDER_SITE_OTHER): Payer: Medicare HMO

## 2017-10-22 DIAGNOSIS — Z8679 Personal history of other diseases of the circulatory system: Secondary | ICD-10-CM | POA: Diagnosis not present

## 2017-10-22 DIAGNOSIS — Z9889 Other specified postprocedural states: Secondary | ICD-10-CM

## 2017-10-27 ENCOUNTER — Telehealth: Payer: Self-pay | Admitting: *Deleted

## 2017-10-27 NOTE — Telephone Encounter (Signed)
-----   Message from Drema Dallas, [place] sent at 10/26/2017  4:26 PM EST -----   ----- Message ----- From: Arnoldo Lenis, MD Sent: 10/26/2017  12:56 PM To: Drema Dallas, CMA  [place] looks good of his aorta, we will continue to monitor  Zandra Abts MD

## 2017-10-27 NOTE — Telephone Encounter (Signed)
Pt aware and voiced understanding - routed to pcp  

## 2017-11-12 ENCOUNTER — Other Ambulatory Visit: Payer: Self-pay

## 2017-11-12 ENCOUNTER — Inpatient Hospital Stay (HOSPITAL_COMMUNITY): Payer: Medicare HMO | Attending: Hematology | Admitting: Hematology

## 2017-11-12 ENCOUNTER — Inpatient Hospital Stay (HOSPITAL_COMMUNITY): Payer: Medicare HMO

## 2017-11-12 ENCOUNTER — Encounter (HOSPITAL_COMMUNITY): Payer: Self-pay | Admitting: Emergency Medicine

## 2017-11-12 ENCOUNTER — Encounter (HOSPITAL_COMMUNITY): Payer: Self-pay | Admitting: Hematology

## 2017-11-12 DIAGNOSIS — D696 Thrombocytopenia, unspecified: Secondary | ICD-10-CM | POA: Diagnosis not present

## 2017-11-12 LAB — LACTATE DEHYDROGENASE: LDH: 190 U/L (ref 98–192)

## 2017-11-12 LAB — CBC WITH DIFFERENTIAL/PLATELET
BASOS ABS: 0 10*3/uL (ref 0.0–0.1)
Basophils Relative: 0 %
EOS PCT: 2 %
Eosinophils Absolute: 0.1 10*3/uL (ref 0.0–0.7)
HEMATOCRIT: 38.6 % — AB (ref 39.0–52.0)
Hemoglobin: 12.7 g/dL — ABNORMAL LOW (ref 13.0–17.0)
LYMPHS PCT: 22 %
Lymphs Abs: 1 10*3/uL (ref 0.7–4.0)
MCH: 31.7 pg (ref 26.0–34.0)
MCHC: 32.9 g/dL (ref 30.0–36.0)
MCV: 96.3 fL (ref 78.0–100.0)
MONOS PCT: 10 %
Monocytes Absolute: 0.5 10*3/uL (ref 0.1–1.0)
Neutro Abs: 3 10*3/uL (ref 1.7–7.7)
Neutrophils Relative %: 66 %
Platelets: 107 10*3/uL — ABNORMAL LOW (ref 150–400)
RBC: 4.01 MIL/uL — AB (ref 4.22–5.81)
RDW: 17.6 % — ABNORMAL HIGH (ref 11.5–15.5)
WBC: 4.6 10*3/uL (ref 4.0–10.5)

## 2017-11-12 LAB — TECHNOLOGIST SMEAR REVIEW

## 2017-11-12 LAB — VITAMIN B12: Vitamin B-12: 217 pg/mL (ref 180–914)

## 2017-11-12 LAB — FOLATE: FOLATE: 25.7 ng/mL (ref >5.9)
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
## 2017-11-13 ENCOUNTER — Other Ambulatory Visit (HOSPITAL_COMMUNITY): Payer: Self-pay

## 2017-11-13 DIAGNOSIS — D696 Thrombocytopenia, unspecified: Secondary | ICD-10-CM

## 2017-11-13 LAB — RHEUMATOID FACTOR

## 2017-11-13 LAB — ANTINUCLEAR ANTIBODIES, IFA: ANA Ab, IFA: NEGATIVE

## 2017-11-13 LAB — HEPATITIS PANEL, ACUTE
HCV Ab: 0.7 s/co ratio (ref 0.0–0.9)
HEP B C IGM: NEGATIVE
Hep A IgM: NEGATIVE
Hepatitis B Surface Ag: NEGATIVE

## 2017-11-16 ENCOUNTER — Inpatient Hospital Stay (HOSPITAL_COMMUNITY): Payer: Medicare HMO

## 2017-11-16 DIAGNOSIS — D696 Thrombocytopenia, unspecified: Secondary | ICD-10-CM

## 2017-11-17 LAB — LUPUS ANTICOAGULANT PANEL
DRVVT: 31.9 s (ref 0.0–47.0)
PTT LA: 29.5 s (ref 0.0–51.9)

## 2017-12-14 ENCOUNTER — Ambulatory Visit (HOSPITAL_COMMUNITY): Payer: Medicare HMO | Admitting: Hematology

## 2017-12-18 ENCOUNTER — Inpatient Hospital Stay (HOSPITAL_COMMUNITY): Payer: Medicare HMO | Attending: Hematology | Admitting: Oncology

## 2017-12-18 VITALS — BP 127/85 | HR 64 | Temp 97.5°F | Resp 20 | Wt 215.0 lb

## 2017-12-18 DIAGNOSIS — J449 Chronic obstructive pulmonary disease, unspecified: Secondary | ICD-10-CM | POA: Insufficient documentation

## 2017-12-18 DIAGNOSIS — C61 Malignant neoplasm of prostate: Secondary | ICD-10-CM

## 2017-12-18 DIAGNOSIS — Z8546 Personal history of malignant neoplasm of prostate: Secondary | ICD-10-CM | POA: Diagnosis not present

## 2017-12-18 DIAGNOSIS — I509 Heart failure, unspecified: Secondary | ICD-10-CM | POA: Insufficient documentation

## 2017-12-18 DIAGNOSIS — D464 Refractory anemia, unspecified: Secondary | ICD-10-CM | POA: Diagnosis not present

## 2017-12-18 DIAGNOSIS — J4489 Other specified chronic obstructive pulmonary disease: Secondary | ICD-10-CM

## 2017-12-18 DIAGNOSIS — E1142 Type 2 diabetes mellitus with diabetic polyneuropathy: Secondary | ICD-10-CM

## 2017-12-18 DIAGNOSIS — E538 Deficiency of other specified B group vitamins: Secondary | ICD-10-CM | POA: Diagnosis not present

## 2017-12-18 DIAGNOSIS — D693 Immune thrombocytopenic purpura: Secondary | ICD-10-CM | POA: Insufficient documentation

## 2017-12-18 DIAGNOSIS — D696 Thrombocytopenia, unspecified: Secondary | ICD-10-CM | POA: Diagnosis present

## 2017-12-18 DIAGNOSIS — M329 Systemic lupus erythematosus, unspecified: Secondary | ICD-10-CM | POA: Diagnosis not present

## 2017-12-18 DIAGNOSIS — I110 Hypertensive heart disease with heart failure: Secondary | ICD-10-CM

## 2017-12-18 NOTE — Patient Instructions (Signed)
[place] at [facility] Discharge Instructions  Platelet count is stable on labs in 10/2017.  Other lab work did not demonstrate a definitive cause of low platelet count.  We suspect your low platelet count is from immune thrombocytopenia.  There is currently no intervention that is needed at this time.  If your platelet count drops below 60K, then we will need to intervene.  I recommend 2000 mcg of Vitamin B12 daily either sublingually or orally.  Your Vitamin B12 level was normal, but at the lower limits of normal.  Return in 12 weeks for labs and follow-up visit.      Thank you for choosing [place] at [facility] to provide your oncology and hematology care.  To afford each patient quality time with our provider, please arrive at least 15 minutes before your scheduled appointment time.   If you have a lab appointment with the [facility] please come in thru the  Main Entrance and check in at the main information desk  You need to re-schedule your appointment should you arrive 10 or more minutes late.  We strive to give you quality time with our providers, and arriving late affects you and other patients whose appointments are after yours.  Also, if you no show three or more times for appointments you may be dismissed from the clinic at the providers discretion.     Again, thank you for choosing [facility].  Our hope is that these requests will decrease the amount of time that you wait before being seen by our physicians.       _____________________________________________________________  Should you have questions after your visit to [facility], please contact our office at [phone] between the hours of 8:30 a.m. and 4:30 p.m.  Voicemails left after 4:30 p.m. will not be returned until the following business day.  For prescription refill requests, have your pharmacy contact our office.       Resources For  Cancer Patients and their Caregivers ? American Cancer Society: Can assist with transportation, wigs, general needs, runs Look Good Feel Better.        343 472 9112 ? Cancer Care: Provides financial assistance, online support groups, medication/co-pay assistance.  1-800-813-HOPE (417) 781-2611) ? [place] Assists [place] cancer patients and their families through emotional , educational and financial support.  217-360-1251 ? [place] DSS Where to apply for food stamps, Medicaid and utility assistance. 9564505522 ? [facility]: Transportation to medical appointments. 234-731-7412 ? Social Security Administration: May apply for disability if have a Stage IV cancer. (626)511-6129 769-780-5119 ? [employer], Disability and Transit Services: Assists with nutrition, care and transit needs. [place] Support Programs:   > Cancer Support Group  2nd Tuesday of the month 1pm-2pm, Journey Room   > Creative Journey  3rd Tuesday of the month 1130am-1pm, Journey Room

## 2017-12-18 NOTE — Assessment & Plan Note (Addendum)
Thrombocytopenia, stable and chronic, suspicious for chronic ITP, dating back to at least 2010 having undergone a BMbx in 07/2011 showing a hypercellular marrow with abundant megakaryocytes.  CT imaging on 10/10/2017 was negative for liver pathology and splenomegaly.    Degrees of thrombocytopenia can be divided into mild (platelet count 100,000 to 150,000/microL), moderate (50,000 to 99,000/microL), and severe (<50,000/microL). Severe thrombocytopenia confers a greater risk of bleeding, but the correlation between platelet count and bleeding risk varies according to the underlying condition and may be unpredictable.  Asymptomatic, incidental findings, mild thrombocytopenia is commonly caused by immune thrombocytopenia (ITP), occult liver disease, HIV infection, and myelodysplastic syndromes.  Congenital thrombocytopenic conditions, sometimes misdiagnosed as ITP, may also occur.  In a patient with incidentally discovered asymptomatic thrombocytopenia and a probable diagnosis of ITP, no further evaluation beyond routine history, physical examination, CBC, review of peripheral blood smear, testing for HIV and Hepatitis C is necessary.  Anti-platelet antibody studies are not routinely done and imaging studies and bone marrow aspiration and biopsy are not necessary unless other abnormalities are present.  The natural history of asymptomatic, mild thrombocytopenia was studied prospectively in 217 apparently healthy individuals referred to a hematology center for incidentally discovered platelet counts between 100,000 and 150,000/microL [27]. At six months of observation, 23 (11 percent) had normal platelet counts, two developed a myelodysplastic syndrome (refractory anemia), and one developed systemic lupus erythematosus. The remaining 191 individuals (88 percent) had persistent platelet counts <150,000/microL during this period without other signs of disease becoming evident; after five years, most (24 percent)  had spontaneous resolution or persistent mild thrombocytopenia without development of an associated condition, supporting a diagnosis of ITP.  Will complete thrombocytopenia peripheral work-up at next lab appointment: Labs in 12 weeks: CBC diff, HIV screen (HIV 1/2 Ab+Ag), iron/TIBC, ferritin, serum copper.  Return in 12 weeks for follow-up.  If platelet count drops to 50-60K he will need a repeat bone marrow aspiration and biopsy.

## 2017-12-18 NOTE — Assessment & Plan Note (Addendum)
Low-normal B12 at 217 in 10/2017.    Labs today: MMA, intrinsic factor antibody, anti-parietal cell antibody.  Above mentioned labs will be performed to rule out pernicious anemia.  If negative antibody testing, he should do fine with oral B12 replacement therapy, either PO or SL.  If pernicious anemia is identified, we will need to transition to IM B12 replacement therapy.  Recommend 2000 mcg of B12 PO or SL.  Labs in 12 weeks: B12, MMA
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
## 2018-01-04 ENCOUNTER — Other Ambulatory Visit: Payer: Self-pay | Admitting: Dermatology

## 2018-01-04 DIAGNOSIS — D0439 Carcinoma in situ of skin of other parts of face: Secondary | ICD-10-CM | POA: Diagnosis not present

## 2018-01-04 DIAGNOSIS — C4492 Squamous cell carcinoma of skin, unspecified: Secondary | ICD-10-CM

## 2018-01-04 DIAGNOSIS — L821 Other seborrheic keratosis: Secondary | ICD-10-CM | POA: Diagnosis not present

## 2018-01-04 DIAGNOSIS — D485 Neoplasm of uncertain behavior of skin: Secondary | ICD-10-CM | POA: Diagnosis not present

## 2018-01-04 DIAGNOSIS — L570 Actinic keratosis: Secondary | ICD-10-CM | POA: Diagnosis not present

## 2018-01-04 DIAGNOSIS — D692 Other nonthrombocytopenic purpura: Secondary | ICD-10-CM | POA: Diagnosis not present

## 2018-01-04 DIAGNOSIS — D229 Melanocytic nevi, unspecified: Secondary | ICD-10-CM | POA: Diagnosis not present

## 2018-01-04 HISTORY — DX: Squamous cell carcinoma of skin, unspecified: C44.92

## 2018-01-15 ENCOUNTER — Encounter: Payer: Self-pay | Admitting: Nurse Practitioner

## 2018-01-15 ENCOUNTER — Ambulatory Visit (INDEPENDENT_AMBULATORY_CARE_PROVIDER_SITE_OTHER): Payer: Medicare HMO | Admitting: Nurse Practitioner

## 2018-01-15 VITALS — BP 133/70 | HR 74 | Temp 96.8°F | Ht 70.0 in | Wt 210.0 lb

## 2018-01-15 DIAGNOSIS — E785 Hyperlipidemia, unspecified: Secondary | ICD-10-CM

## 2018-01-15 DIAGNOSIS — E1142 Type 2 diabetes mellitus with diabetic polyneuropathy: Secondary | ICD-10-CM | POA: Diagnosis not present

## 2018-01-15 DIAGNOSIS — D696 Thrombocytopenia, unspecified: Secondary | ICD-10-CM | POA: Diagnosis not present

## 2018-01-15 DIAGNOSIS — R69 Illness, unspecified: Secondary | ICD-10-CM | POA: Diagnosis not present

## 2018-01-15 DIAGNOSIS — M25572 Pain in left ankle and joints of left foot: Secondary | ICD-10-CM | POA: Diagnosis not present

## 2018-01-15 DIAGNOSIS — Z6829 Body mass index (BMI) 29.0-29.9, adult: Secondary | ICD-10-CM

## 2018-01-15 DIAGNOSIS — G4733 Obstructive sleep apnea (adult) (pediatric): Secondary | ICD-10-CM

## 2018-01-15 DIAGNOSIS — J449 Chronic obstructive pulmonary disease, unspecified: Secondary | ICD-10-CM | POA: Diagnosis not present

## 2018-01-15 DIAGNOSIS — E538 Deficiency of other specified B group vitamins: Secondary | ICD-10-CM | POA: Diagnosis not present

## 2018-01-15 DIAGNOSIS — F411 Generalized anxiety disorder: Secondary | ICD-10-CM

## 2018-01-15 DIAGNOSIS — K219 Gastro-esophageal reflux disease without esophagitis: Secondary | ICD-10-CM | POA: Diagnosis not present

## 2018-01-15 DIAGNOSIS — I110 Hypertensive heart disease with heart failure: Secondary | ICD-10-CM | POA: Diagnosis not present

## 2018-01-15 LAB — CMP14+EGFR
ALT: 16 [IU]/L (ref 0–44)
AST: 19 [IU]/L (ref 0–40)
Albumin/Globulin Ratio: 2.2 (ref 1.2–2.2)
Albumin: 4.2 g/dL (ref 3.5–4.7)
Alkaline Phosphatase: 67 [IU]/L (ref 39–117)
BUN/Creatinine Ratio: 15 (ref 10–24)
BUN: 13 mg/dL (ref 8–27)
Bilirubin Total: 1.1 mg/dL (ref 0.0–1.2)
CO2: 25 mmol/L (ref 20–29)
Calcium: 9.7 mg/dL (ref 8.6–10.2)
Chloride: 107 mmol/L — ABNORMAL HIGH (ref 96–106)
Creatinine, Ser: 0.84 mg/dL (ref 0.76–1.27)
GFR calc Af Amer: 94 mL/min/{1.73_m2}
GFR calc non Af Amer: 82 mL/min/{1.73_m2}
Globulin, Total: 1.9 g/dL (ref 1.5–4.5)
Glucose: 135 mg/dL — ABNORMAL HIGH (ref 65–99)
Potassium: 4.2 mmol/L (ref 3.5–5.2)
Sodium: 146 mmol/L — ABNORMAL HIGH (ref 134–144)
Total Protein: 6.1 g/dL (ref 6.0–8.5)

## 2018-01-15 LAB — LIPID PANEL
Chol/HDL Ratio: 3.1 ratio (ref 0.0–5.0)
Cholesterol, Total: 101 mg/dL (ref 100–199)
HDL: 33 mg/dL — ABNORMAL LOW
LDL Calculated: 41 mg/dL (ref 0–99)
Triglycerides: 134 mg/dL (ref 0–149)
VLDL Cholesterol Cal: 27 mg/dL (ref 5–40)

## 2018-01-15 LAB — BAYER DCA HB A1C WAIVED: HB A1C: 6.1 % (ref <7.0)

## 2018-01-15 MED ORDER — BUSPIRONE HCL 10 MG PO TABS: 10.0000 mg | ORAL_TABLET | Freq: Three times a day (TID) | ORAL | 5 refills | Status: DC

## 2018-01-15 MED ORDER — ATORVASTATIN CALCIUM 80 MG PO TABS: 80.0000 mg | ORAL_TABLET | Freq: Every day | ORAL | 1 refills | Status: DC

## 2018-01-15 MED ORDER — BUDESONIDE-FORMOTEROL FUMARATE 160-4.5 MCG/ACT IN AERO: 2.0000 | INHALATION_SPRAY | Freq: Two times a day (BID) | RESPIRATORY_TRACT | 5 refills | Status: DC

## 2018-01-15 MED ORDER — OMEPRAZOLE 20 MG PO CPDR: 20.0000 mg | DELAYED_RELEASE_CAPSULE | Freq: Every day | ORAL | 1 refills | Status: DC

## 2018-01-15 NOTE — Patient Instructions (Signed)

## 2018-01-15 NOTE — Progress Notes (Signed)
Subjective:    Patient ID: [name], male    DOB: [birth year], 84 y.o.   MRN: [id number]   Chief Complaint: Medical Management of Chronic Issues   HPI:  1. Hypertensive heart disease with congestive heart failure, unspecified heart failure type ([place]) no c/o chest pain, sob or headache. Does not check blood pressure at home. BP Readings from Last 3 Encounters:  12/18/17 127/85  10/15/17 136/72  10/11/17 (!) 146/80     2. OSA (obstructive sleep apnea)  He gave up his CPAP machine years ago. He said it was uncomfortable and he did not sleep any better with it.  3. COPD with asthma ([place]) uses symbicort daily- no cough. His sob is no worse then usual.  4. Type 2 diabetes mellitus with diabetic polyneuropathy, unspecified whether long term insulin use ([place]) last hgba1c was 7.0. He does not check blood sugars every day at home.  5. Hyperlipidemia with target LDL less than 100  Does not watch diet at all  6. BMI 29.0-29.9,adult  No recent weight changes  7. B12 deficiency  Hematologist gave him oral b12 . He had a bunch of labs done 11/12/17 and b12 was 217- low end of normal.  8. Thrombocytopenia ([place])  He just saw hematologist 12/18/17. Stable but chronic. He has follow up appointment in july    Outpatient Encounter Medications as of 01/15/2018  Medication Sig  . acetaminophen (TYLENOL 8 HOUR) 650 MG CR tablet Take 1 tablet (650 mg total) by mouth every 8 (eight) hours.  [name] albuterol (PROVENTIL HFA;VENTOLIN HFA) 108 (90 Base) MCG/ACT inhaler Inhale 2 puffs into the lungs every 6 (six) hours as needed for wheezing or shortness of breath.  [name] aspirin 81 MG tablet Take 81 mg by mouth every other day.   [name] atorvastatin (LIPITOR) 80 MG tablet Take 1 tablet (80 mg total) by mouth daily.  . budesonide-formoterol (SYMBICORT) 160-4.5 MCG/ACT inhaler Inhale 2 puffs into the lungs 2 (two) times daily.  . busPIRone (BUSPAR) 10 MG tablet Take 1 tablet (10 mg total) by mouth 3 (three) times daily.    [name] glucose blood (ONETOUCH VERIO) test strip Use to check blood sugars daily  . guaiFENesin (MUCINEX) 600 MG 12 hr tablet Take 1 tablet by mouth daily.   . Lancets (ONETOUCH ULTRASOFT) lancets Use to check blood sugars daily  . meclizine (ANTIVERT) 12.5 MG tablet Take 1 tablet (12.5 mg total) 3 (three) times daily as needed by mouth for dizziness.  . metFORMIN (GLUCOPHAGE) 500 MG tablet TAKE 1 TABLET (500 MG TOTAL) BY MOUTH 2 (TWO) TIMES DAILY WITH A MEAL.  [name] omeprazole (PRILOSEC) 20 MG capsule Take 1 capsule (20 mg total) by mouth daily.  . tamsulosin (FLOMAX) 0.4 MG CAPS capsule Take 1 capsule (0.4 mg total) by mouth daily after supper.      New complaints: Left ankle pain that radiates all the way up leg to hip. Pain increases with walking. Started 3 weeks ago.  Social history: Lives with wife  Review of Systems  Constitutional: Negative for activity change and appetite change.  HENT: Negative.   Eyes: Negative for pain.  Respiratory: Negative for shortness of breath.   Cardiovascular: Negative for chest pain, palpitations and leg swelling.  Gastrointestinal: Negative for abdominal pain.  Endocrine: Negative for polydipsia.  Genitourinary: Negative.   Musculoskeletal: Positive for gait problem (walks with cane).  Skin: Negative for rash.  Neurological: Negative for dizziness, weakness and headaches.  Hematological: Does not bruise/bleed easily.  Psychiatric/Behavioral: Negative.   All other systems reviewed and are negative.      Objective:   Physical Exam  Constitutional: He is oriented to person, place, and time.  HENT:  Head: Normocephalic.  Nose: Nose normal.  Mouth/Throat: Oropharynx is clear and moist.  Eyes: Pupils are equal, round, and reactive to light. EOM are normal.  Neck: Normal range of motion and phonation normal. Neck supple. No JVD present. Carotid bruit is not present. No thyroid mass and no thyromegaly present.  Cardiovascular: Normal rate, regular  rhythm and intact distal pulses.  Pulmonary/Chest: Effort normal and breath sounds normal. No respiratory distress.  Abdominal: Soft. Normal appearance, normal aorta and bowel sounds are normal. There is no tenderness.  Musculoskeletal: Normal range of motion.  Gait slow and steady walks with cane. FROM of left ankle without pain Pain with bearing weight on left ankle  Lymphadenopathy:    He has no cervical adenopathy.  Neurological: He is alert and oriented to person, place, and time.  Skin: Skin is warm and dry.  Psychiatric: He has a normal mood and affect. His behavior is normal. Judgment and thought content normal.   BP 133/70   Pulse 74   Temp (!) 96.8 F (36 C) (Oral)   Ht '5\' 10"'$  (1.778 m)   Wt 210 lb (95.3 kg)   BMI 30.13 kg/m   hgba1c 6.1%      Assessment & Plan:  [name] comes in today with chief complaint of Medical Management of Chronic Issues   Diagnosis and orders addressed:  1. Hypertensive heart disease with congestive heart failure, unspecified heart failure type (HCC) low sodium in diet - CMP14+EGFR  2. OSA (obstructive sleep apnea) Refuses CPAP  3. COPD with asthma ([place]) Continue inhaler daily - budesonide-formoterol (SYMBICORT) 160-4.5 MCG/ACT inhaler; Inhale 2 puffs into the lungs 2 (two) times daily.  Dispense: 1 Inhaler; Refill: 5  4. Type 2 diabetes mellitus with diabetic polyneuropathy, unspecified whether long term insulin use (H[facility]) Continue to watch carbs in diet - Bayer DCA Hb A1c Waived  5. Hyperlipidemia with target LDL less than 100 Low fat diet - Lipid panel - atorvastatin (LIPITOR) 80 MG tablet; Take 1 tablet (80 mg total) by mouth daily.  Dispense: 90 tablet; Refill: 1  6. BMI 29.0-29.9,adult Discussed diet and exercise for person with BMI >25 Will recheck weight in 3-6 months  7. B12 deficiency Will talk with hematologist  8. Thrombocytopenia ([place]) Keep follow up with hematologist  9. GAD (generalized anxiety  disorder) Stress management - busPIRone (BUSPAR) 10 MG tablet; Take 1 tablet (10 mg total) by mouth 3 (three) times daily.  Dispense: 30 tablet; Refill: 5  10. Gastroesophageal reflux disease without esophagitis Avoid spicy foods Do not eat 2 hours prior to bedtime - omeprazole (PRILOSEC) 20 MG capsule; Take 1 capsule (20 mg total) by mouth daily.  Dispense: 90 capsule; Refill: 1  11. Acute left ankle pain - Ambulatory referral to Orthopedic Surgery   Labs pending Health Maintenance reviewed Diet and exercise encouraged  Follow up plan: 3 months   [name] [name], [name]

## 2018-01-19 ENCOUNTER — Telehealth: Payer: Self-pay | Admitting: Nurse Practitioner

## 2018-01-19 NOTE — Telephone Encounter (Signed)
Spoke to pt's wife and she states pt didn't need Symbicort mailed to them from [facility]. Advised pt's wife she needs to call the pharmacy to cancel the shipment.

## 2018-01-21 ENCOUNTER — Telehealth: Payer: Self-pay | Admitting: Nurse Practitioner

## 2018-01-21 NOTE — Telephone Encounter (Signed)
Pt states she doesn't want Symbicort sent to the pharmacy and only wants samples because the pharmacy sends it out in the mail when they don't need it and she can't afford it. Pt states she called the mail pharmacy and cancelled the prescription.

## 2018-01-21 NOTE — Telephone Encounter (Signed)
What has he been on in the ast as far as inhalers

## 2018-01-22 ENCOUNTER — Ambulatory Visit (INDEPENDENT_AMBULATORY_CARE_PROVIDER_SITE_OTHER): Payer: Medicare HMO

## 2018-01-22 ENCOUNTER — Ambulatory Visit (INDEPENDENT_AMBULATORY_CARE_PROVIDER_SITE_OTHER): Payer: Medicare HMO | Admitting: Orthopedic Surgery

## 2018-01-22 ENCOUNTER — Encounter (INDEPENDENT_AMBULATORY_CARE_PROVIDER_SITE_OTHER): Payer: Self-pay | Admitting: Orthopedic Surgery

## 2018-01-22 DIAGNOSIS — M5410 Radiculopathy, site unspecified: Secondary | ICD-10-CM

## 2018-01-22 MED ORDER — METHOCARBAMOL 500 MG PO TABS: 500.0000 mg | ORAL_TABLET | Freq: Three times a day (TID) | ORAL | 0 refills | Status: DC | PRN

## 2018-01-22 MED ORDER — IBUPROFEN 600 MG PO TABS: ORAL_TABLET | ORAL | 0 refills | Status: DC

## 2018-01-23 ENCOUNTER — Encounter (INDEPENDENT_AMBULATORY_CARE_PROVIDER_SITE_OTHER): Payer: Self-pay | Admitting: Orthopedic Surgery
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
## 2018-01-26 NOTE — Addendum Note (Signed)
Addended byLaurann Montana on: 01/26/2018 08:43 AM   Modules accepted: Orders

## 2018-01-31 IMAGING — DX DG CHEST 2V
2 series · 2 of 2 positions shown · non-contrast
Comparison: 06/24/2017, 05/13/2015

CLINICAL DATA: Cough and congestion

EXAM:
CHEST  2 VIEW

[chest pa]
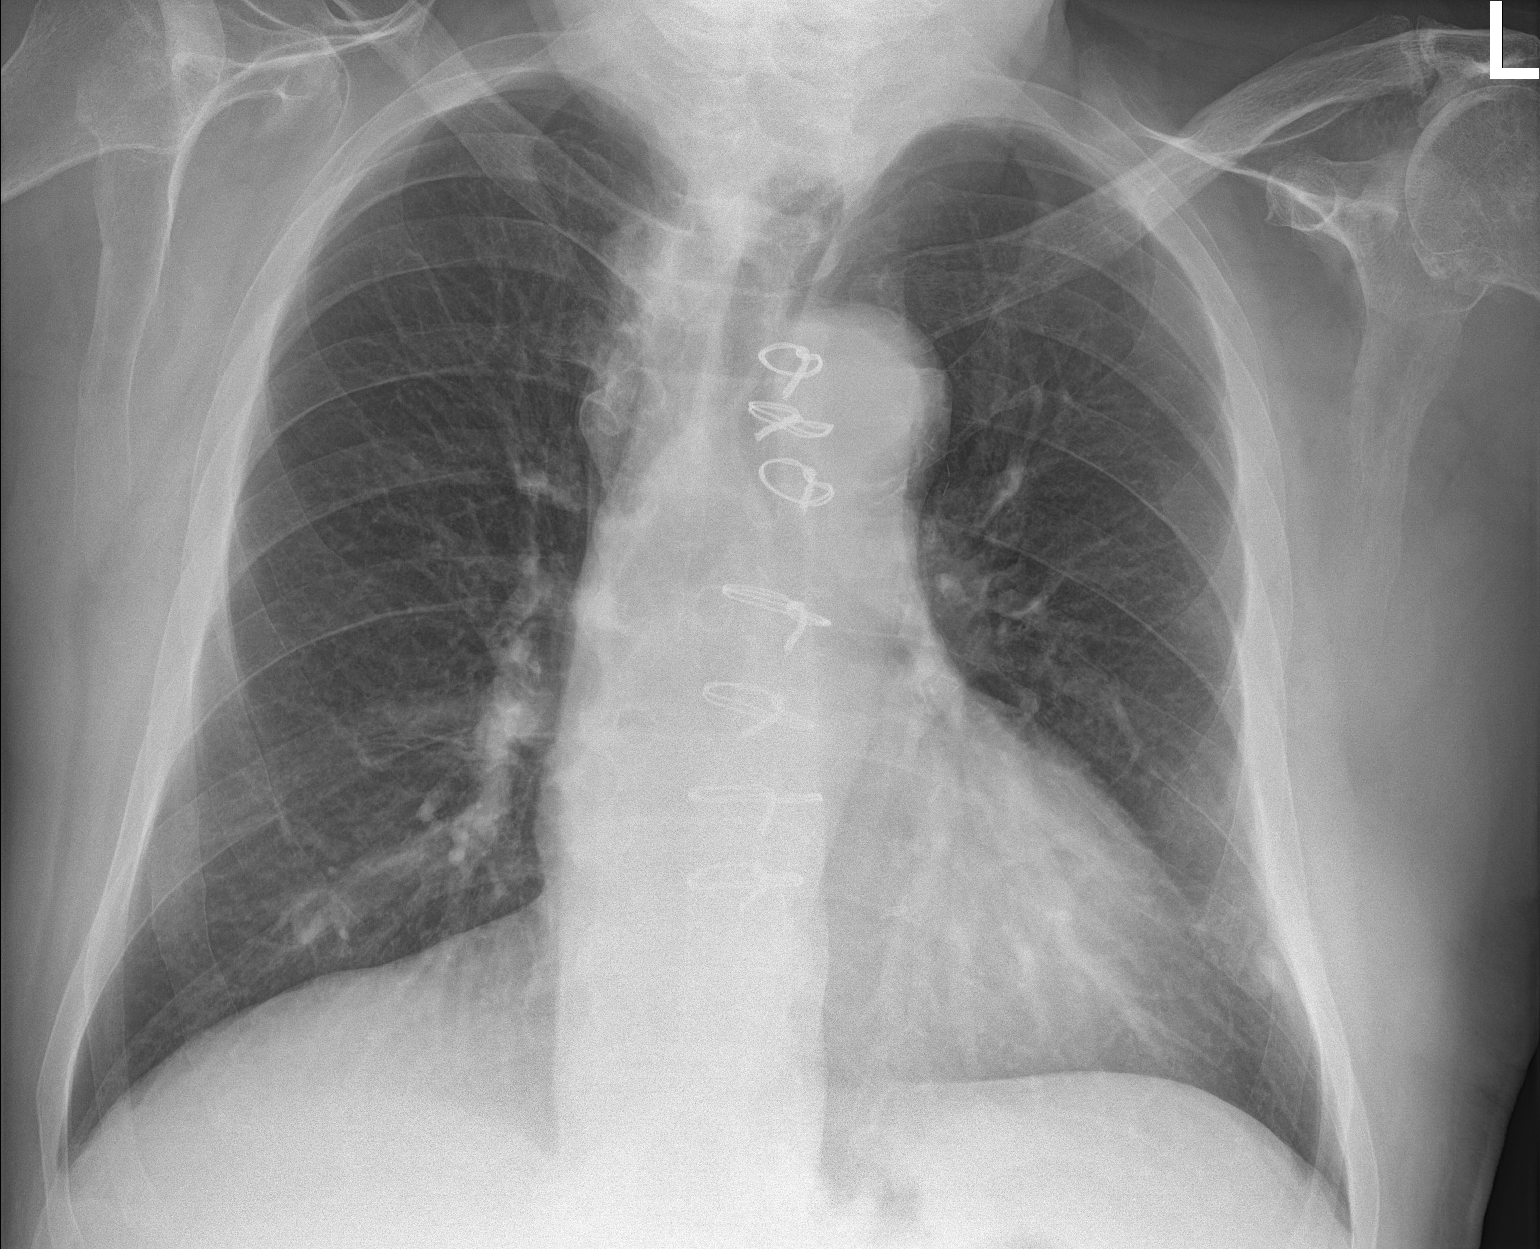

[chest lat]
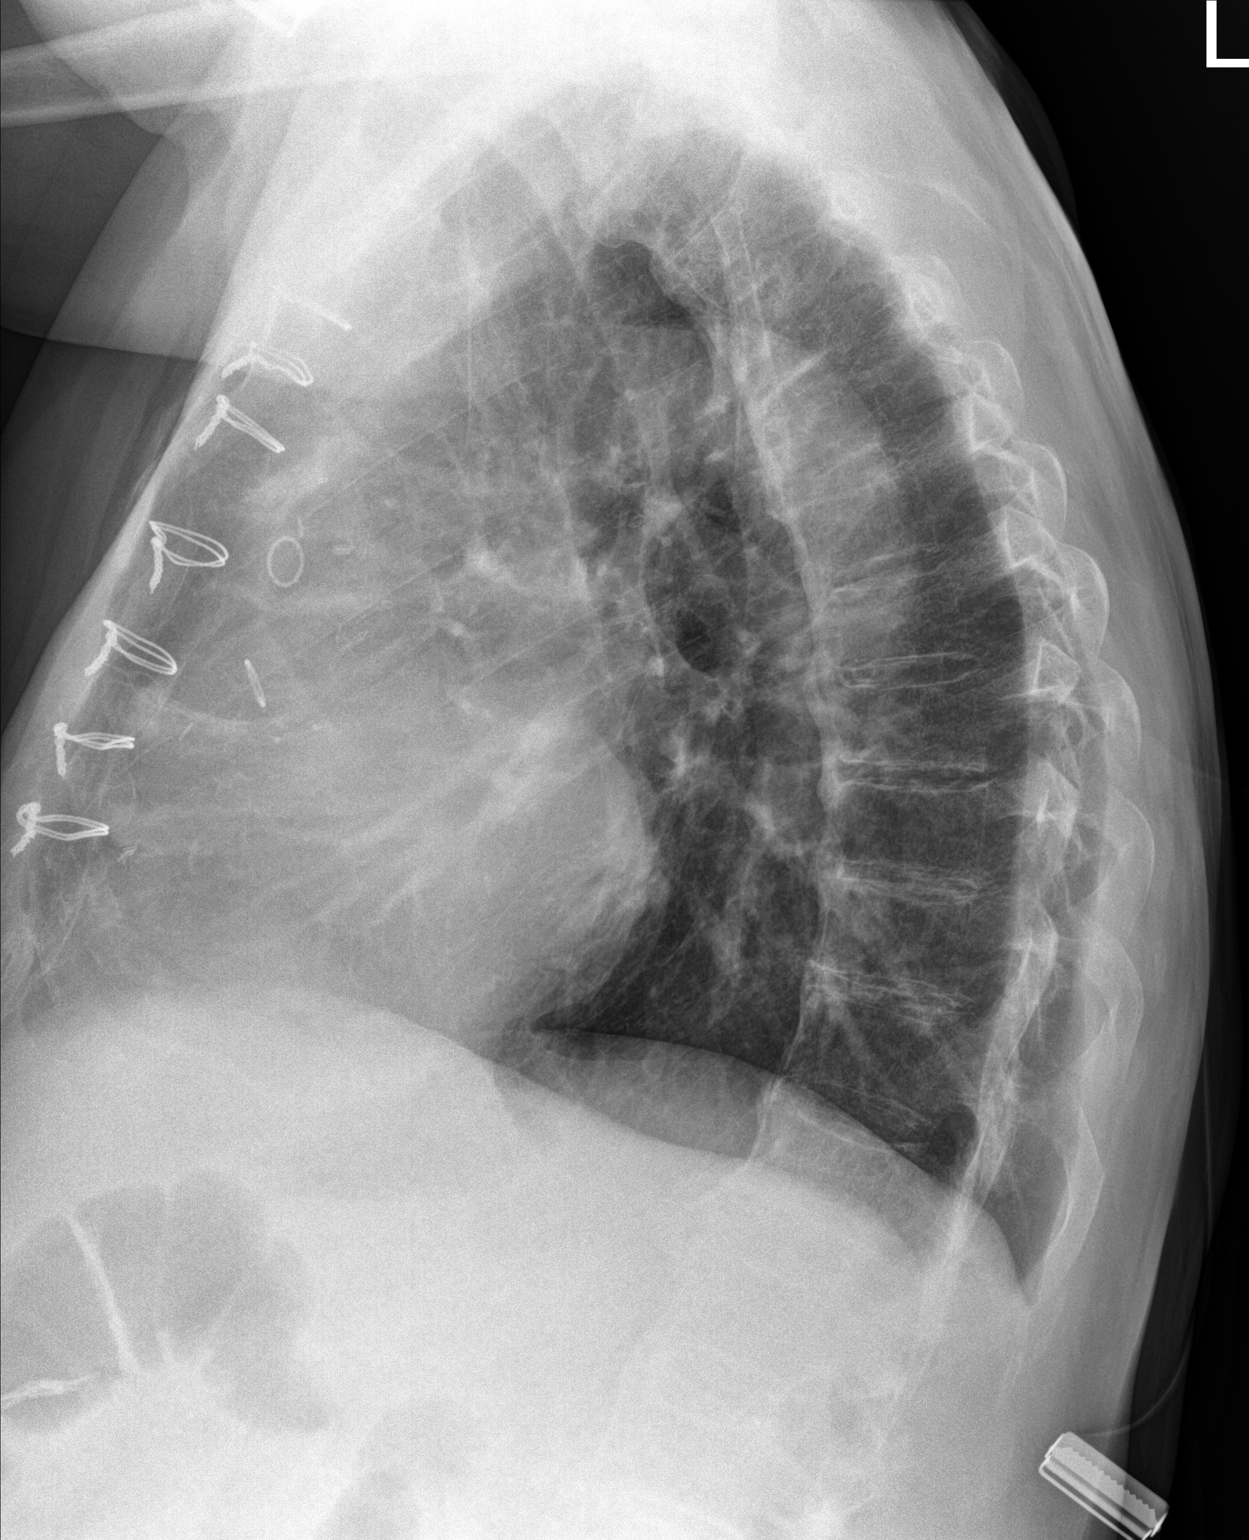

[2 of 2 positions shown; findings below may reference images not displayed]

FINDINGS: Enlargement of cardiac silhouette post CABG.

Atherosclerotic calcification aorta.

Emphysematous changes consistent with COPD.

LEFT basilar nipple shadow unchanged since 05/14/2015.

No acute infiltrate, pleural effusion or pneumothorax.

Bones demineralized.
IMPRESSION: Enlargement of cardiac silhouette post CABG.

COPD changes without acute infiltrate.

## 2018-02-05 ENCOUNTER — Telehealth (INDEPENDENT_AMBULATORY_CARE_PROVIDER_SITE_OTHER): Payer: Self-pay | Admitting: Orthopedic Surgery

## 2018-02-05 ENCOUNTER — Telehealth (INDEPENDENT_AMBULATORY_CARE_PROVIDER_SITE_OTHER): Payer: Self-pay | Admitting: Orthopaedic Surgery

## 2018-02-05 DIAGNOSIS — M5410 Radiculopathy, site unspecified: Secondary | ICD-10-CM

## 2018-02-05 NOTE — Telephone Encounter (Signed)
See other note. Pending response from Dr Marlou Sa.

## 2018-02-05 NOTE — Telephone Encounter (Signed)
Patient's daughter called stating that he has taken all of the medication that was prescribed for him on May 31.  She stated that her father did not want to do the PT.  She also stated that Dr. Marlou Sa had discussed doing some injections but was not sure what they were called.  CB#[phone].  Thank you.

## 2018-02-05 NOTE — Telephone Encounter (Signed)
The injectors require scan to guide location of injections - need mri first

## 2018-02-05 NOTE — Telephone Encounter (Signed)
IC and advised. Order submitted. Aware will be contacted to schedule.

## 2018-02-05 NOTE — Telephone Encounter (Signed)
Please advise. Last OV note states scan with possible injections. However, daughter questions if just can get injections without scan?

## 2018-02-05 NOTE — Telephone Encounter (Signed)
Patient daughter called and stated that she was told her father could get an injection in his back.  Advised there was no referral to [facility]. She wants the referral done so he can get an appt.  The antiinflammatory is not working.  Please call to advise.

## 2018-02-11 ENCOUNTER — Other Ambulatory Visit: Payer: Self-pay | Admitting: Family

## 2018-02-11 ENCOUNTER — Telehealth: Payer: Self-pay | Admitting: Nurse Practitioner

## 2018-02-11 DIAGNOSIS — N3946 Mixed incontinence: Secondary | ICD-10-CM

## 2018-02-11 DIAGNOSIS — K219 Gastro-esophageal reflux disease without esophagitis: Secondary | ICD-10-CM

## 2018-02-11 DIAGNOSIS — J449 Chronic obstructive pulmonary disease, unspecified: Secondary | ICD-10-CM

## 2018-02-11 DIAGNOSIS — J209 Acute bronchitis, unspecified: Secondary | ICD-10-CM

## 2018-02-11 DIAGNOSIS — J440 Chronic obstructive pulmonary disease with acute lower respiratory infection: Secondary | ICD-10-CM

## 2018-02-11 DIAGNOSIS — E785 Hyperlipidemia, unspecified: Secondary | ICD-10-CM

## 2018-02-11 DIAGNOSIS — E1142 Type 2 diabetes mellitus with diabetic polyneuropathy: Secondary | ICD-10-CM

## 2018-02-11 DIAGNOSIS — F411 Generalized anxiety disorder: Secondary | ICD-10-CM

## 2018-02-11 NOTE — Telephone Encounter (Signed)
Last seen 01/15/18  MMM

## 2018-02-12 MED ORDER — BUDESONIDE-FORMOTEROL FUMARATE 160-4.5 MCG/ACT IN AERO: 2.0000 | INHALATION_SPRAY | Freq: Two times a day (BID) | RESPIRATORY_TRACT | 5 refills | Status: DC

## 2018-02-12 MED ORDER — ATORVASTATIN CALCIUM 80 MG PO TABS
80.0000 mg | ORAL_TABLET | Freq: Every day | ORAL | 1 refills | Status: DC
Start: 2018-02-12 — End: 2018-11-01

## 2018-02-12 MED ORDER — OMEPRAZOLE 20 MG PO CPDR: 20.0000 mg | DELAYED_RELEASE_CAPSULE | Freq: Every day | ORAL | 1 refills | Status: DC

## 2018-02-12 MED ORDER — ALBUTEROL SULFATE HFA 108 (90 BASE) MCG/ACT IN AERS: 2.0000 | INHALATION_SPRAY | Freq: Four times a day (QID) | RESPIRATORY_TRACT | 2 refills | Status: DC | PRN

## 2018-02-12 MED ORDER — TAMSULOSIN HCL 0.4 MG PO CAPS: 0.4000 mg | ORAL_CAPSULE | Freq: Every day | ORAL | 1 refills | Status: DC

## 2018-02-12 MED ORDER — METFORMIN HCL 500 MG PO TABS: ORAL_TABLET | ORAL | 1 refills | Status: DC

## 2018-02-12 MED ORDER — BUSPIRONE HCL 10 MG PO TABS: 10.0000 mg | ORAL_TABLET | Freq: Three times a day (TID) | ORAL | 5 refills | Status: DC

## 2018-02-12 NOTE — Telephone Encounter (Signed)
Wife aware medications have been sent to [facility] pharmacy

## 2018-02-15 ENCOUNTER — Ambulatory Visit
Admission: RE | Admit: 2018-02-15 | Discharge: 2018-02-15 | Disposition: A | Discharge status: Home / Self Care | Payer: Medicare HMO | Source: Ambulatory Visit | Attending: Orthopedic Surgery | Admitting: Orthopedic Surgery

## 2018-02-15 DIAGNOSIS — M5410 Radiculopathy, site unspecified: Secondary | ICD-10-CM

## 2018-02-15 DIAGNOSIS — M48061 Spinal stenosis, lumbar region without neurogenic claudication: Secondary | ICD-10-CM | POA: Diagnosis not present

## 2018-02-17 ENCOUNTER — Encounter (INDEPENDENT_AMBULATORY_CARE_PROVIDER_SITE_OTHER): Payer: Self-pay | Admitting: Orthopedic Surgery

## 2018-02-17 ENCOUNTER — Ambulatory Visit (INDEPENDENT_AMBULATORY_CARE_PROVIDER_SITE_OTHER): Payer: Medicare HMO | Admitting: Orthopedic Surgery

## 2018-02-17 DIAGNOSIS — M5410 Radiculopathy, site unspecified: Secondary | ICD-10-CM | POA: Diagnosis not present
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
## 2018-02-18 ENCOUNTER — Telehealth (INDEPENDENT_AMBULATORY_CARE_PROVIDER_SITE_OTHER): Payer: Self-pay | Admitting: *Deleted

## 2018-02-18 NOTE — Telephone Encounter (Signed)
Please advise. Thanks.  

## 2018-02-19 ENCOUNTER — Other Ambulatory Visit (INDEPENDENT_AMBULATORY_CARE_PROVIDER_SITE_OTHER): Payer: Self-pay

## 2018-02-19 MED ORDER — TRAMADOL HCL 50 MG PO TABS: 50.0000 mg | ORAL_TABLET | Freq: Three times a day (TID) | ORAL | 0 refills | Status: DC | PRN

## 2018-02-19 NOTE — Telephone Encounter (Signed)
Okay for tramadol 1 p.o. every 8 hours as needed pain #30 with no refills

## 2018-02-19 NOTE — Telephone Encounter (Signed)
Called into pharmacy

## 2018-03-01 DIAGNOSIS — G8929 Other chronic pain: Secondary | ICD-10-CM | POA: Diagnosis not present

## 2018-03-01 DIAGNOSIS — R030 Elevated blood-pressure reading, without diagnosis of hypertension: Secondary | ICD-10-CM | POA: Diagnosis not present

## 2018-03-01 DIAGNOSIS — N400 Enlarged prostate without lower urinary tract symptoms: Secondary | ICD-10-CM | POA: Diagnosis not present

## 2018-03-01 DIAGNOSIS — I252 Old myocardial infarction: Secondary | ICD-10-CM | POA: Diagnosis not present

## 2018-03-01 DIAGNOSIS — E785 Hyperlipidemia, unspecified: Secondary | ICD-10-CM | POA: Diagnosis not present

## 2018-03-01 DIAGNOSIS — I2510 Atherosclerotic heart disease of native coronary artery without angina pectoris: Secondary | ICD-10-CM | POA: Diagnosis not present

## 2018-03-01 DIAGNOSIS — Z791 Long term (current) use of non-steroidal anti-inflammatories (NSAID): Secondary | ICD-10-CM | POA: Diagnosis not present

## 2018-03-01 DIAGNOSIS — K219 Gastro-esophageal reflux disease without esophagitis: Secondary | ICD-10-CM | POA: Diagnosis not present

## 2018-03-01 DIAGNOSIS — J449 Chronic obstructive pulmonary disease, unspecified: Secondary | ICD-10-CM | POA: Diagnosis not present

## 2018-03-01 DIAGNOSIS — E119 Type 2 diabetes mellitus without complications: Secondary | ICD-10-CM | POA: Diagnosis not present

## 2018-03-09 ENCOUNTER — Ambulatory Visit: Payer: Medicare HMO | Admitting: *Deleted

## 2018-03-12 ENCOUNTER — Other Ambulatory Visit (HOSPITAL_COMMUNITY): Payer: Medicare HMO

## 2018-03-18 ENCOUNTER — Ambulatory Visit (INDEPENDENT_AMBULATORY_CARE_PROVIDER_SITE_OTHER): Payer: Self-pay

## 2018-03-18 ENCOUNTER — Encounter

## 2018-03-18 ENCOUNTER — Ambulatory Visit (INDEPENDENT_AMBULATORY_CARE_PROVIDER_SITE_OTHER): Payer: Medicare HMO | Admitting: Physical Medicine and Rehabilitation

## 2018-03-18 DIAGNOSIS — M5416 Radiculopathy, lumbar region: Secondary | ICD-10-CM

## 2018-03-18 DIAGNOSIS — M48062 Spinal stenosis, lumbar region with neurogenic claudication: Secondary | ICD-10-CM | POA: Diagnosis not present

## 2018-03-18 DIAGNOSIS — M5116 Intervertebral disc disorders with radiculopathy, lumbar region: Secondary | ICD-10-CM

## 2018-03-18 MED ORDER — BETAMETHASONE SOD PHOS & ACET 6 (3-3) MG/ML IJ SUSP
12.0000 mg | Freq: Once | INTRAMUSCULAR | Status: AC
  Administered 2018-03-18: 12 mg

## 2018-03-18 NOTE — Patient Instructions (Signed)
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
## 2018-03-18 NOTE — Progress Notes (Signed)
 .  Numeric Pain Rating Scale and Functional Assessment Average Pain 10   In the last MONTH (on 0-10 scale) has pain interfered with the following?  1. General activity like being  able to carry out your everyday physical activities such as walking, climbing stairs, carrying groceries, or moving a chair?  Rating(6)   +Driver, -BT, -Dye Allergies.  

## 2018-03-19 ENCOUNTER — Telehealth (INDEPENDENT_AMBULATORY_CARE_PROVIDER_SITE_OTHER): Payer: Self-pay | Admitting: *Deleted

## 2018-03-19 ENCOUNTER — Ambulatory Visit (HOSPITAL_COMMUNITY): Payer: Medicare HMO | Admitting: Internal Medicine

## 2018-03-19 NOTE — Telephone Encounter (Signed)
For repeat codes [phone] and [phone] appt 04-22-18. Authorization [id number] Auth Effective Date:03/19/2018 Auth End Date:06/17/2018

## 2018-03-23 NOTE — Procedures (Signed)
Lumbosacral Transforaminal Epidural Steroid Injection - Sub-Pedicular Approach with Fluoroscopic Guidance  Patient: [name]      Date of Birth: Sep 07, 1932 MRN: [id number] PCP: [name], [name]      Visit Date: 03/18/2018   Universal Protocol:    Date/Time: 03/18/2018  Consent Given By: the patient  Position: PRONE  Additional Comments: Vital signs were monitored before and after the procedure. Patient was prepped and draped in the usual sterile fashion. The correct patient, procedure, and site was verified.   Injection Procedure Details:  Procedure Site One Meds Administered:  Meds ordered this encounter  Medications  . betamethasone acetate-betamethasone sodium phosphate (CELESTONE) injection 12 mg    Laterality: Left  Location/Site:  L5-S1 S1-2  Needle size: 22 G  Needle type: Spinal  Needle Placement: Transforaminal  Findings:    -Comments: Excellent flow of contrast along the nerve and into the epidural space.  Procedure Details: After squaring off the end-plates to get a true AP view, the C-arm was positioned so that an oblique view of the foramen as noted above was visualized. The target area is just inferior to the "nose of the scotty dog" or sub pedicular. The soft tissues overlying this structure were infiltrated with 2-3 ml. of 1% Lidocaine without Epinephrine.  The spinal needle was inserted toward the target using a "trajectory" view along the fluoroscope beam.  Under AP and lateral visualization, the needle was advanced so it did not puncture dura and was located close the 6 O'Clock position of the pedical in AP tracterory. Biplanar projections were used to confirm position. Aspiration was confirmed to be negative for CSF and/or blood. A 1-2 ml. volume of Isovue-250 was injected and flow of contrast was noted at each level. Radiographs were obtained for documentation purposes.   After attaining the desired flow of contrast documented above,  a 0.5 to 1.0 ml test dose of 0.25% Marcaine was injected into each respective transforaminal space.  The patient was observed for 90 seconds post injection.  After no sensory deficits were reported, and normal lower extremity motor function was noted,   the above injectate was administered so that equal amounts of the injectate were placed at each foramen (level) into the transforaminal epidural space.   Additional Comments:  The patient tolerated the procedure well Dressing: Band-Aid    Post-procedure details: Patient was observed during the procedure. Post-procedure instructions were reviewed.  Patient left the clinic in stable condition.

## 2018-04-01 ENCOUNTER — Encounter: Payer: Self-pay | Admitting: Family Medicine

## 2018-04-01 ENCOUNTER — Ambulatory Visit (INDEPENDENT_AMBULATORY_CARE_PROVIDER_SITE_OTHER): Payer: Medicare HMO | Admitting: Family Medicine

## 2018-04-01 VITALS — BP 148/96 | Temp 96.9°F | Ht 70.0 in | Wt 206.5 lb

## 2018-04-01 DIAGNOSIS — M79605 Pain in left leg: Secondary | ICD-10-CM

## 2018-04-01 MED ORDER — PREDNISONE 10 MG PO TABS: ORAL_TABLET | ORAL | 0 refills | Status: DC

## 2018-04-01 NOTE — Progress Notes (Signed)
Chief Complaint  Patient presents with  . Leg Pain    pt here today c/o lower left leg pain    HPI  Patient presents today for moderate pain. Aching at left mid calf and shin. No injury. Denies edema. Not related to activity.   PMH: Smoking status noted ROS: Per HPI  Objective: BP (!) 148/96   Temp (!) 96.9 F (36.1 C) (Oral)   Ht 5\' 10"  (1.778 m)   Wt 206 lb 8 oz (93.7 kg)   BMI 29.63 kg/m  Gen: NAD, alert, cooperative with exam HEENT: NCAT, EOMI, PERRL CV: RRR, good S1/S2, no murmur Resp: CTABL, no wheezes, non-labored Ext: No edema, warm. No palpable lesion. Moderate varicosities noted. FROM. Mild tenderness of the calf. Neuro: Alert and oriented, No gross deficits  Assessment and plan:  1. Leg pain, anterior, left     Meds ordered this encounter  Medications  . predniSONE (DELTASONE) 10 MG tablet    Sig: Take 5 PO x 3 days, then 4 PO x 3 days, then 3 PO x 3 days, then 2 PO x 3 days then 1 PO x 3 days.    Dispense:  45 tablet    Refill:  0  . traMADol (ULTRAM) 50 MG tablet    Sig: Take 1 tablet (50 mg total) by mouth 2 (two) times daily.    Dispense:  20 tablet    Refill:  0    No orders of the defined types were placed in this encounter.   Follow up as needed.  Claretta Fraise, MD

## 2018-04-04 ENCOUNTER — Encounter: Payer: Self-pay | Admitting: Family Medicine

## 2018-04-04 MED ORDER — TRAMADOL HCL 50 MG PO TABS: 50.0000 mg | ORAL_TABLET | Freq: Two times a day (BID) | ORAL | 0 refills | Status: DC

## 2018-04-08 ENCOUNTER — Ambulatory Visit (INDEPENDENT_AMBULATORY_CARE_PROVIDER_SITE_OTHER): Payer: Medicare HMO | Admitting: Physical Medicine and Rehabilitation

## 2018-04-08 ENCOUNTER — Encounter (INDEPENDENT_AMBULATORY_CARE_PROVIDER_SITE_OTHER): Payer: Self-pay | Admitting: Physical Medicine and Rehabilitation

## 2018-04-08 ENCOUNTER — Ambulatory Visit (INDEPENDENT_AMBULATORY_CARE_PROVIDER_SITE_OTHER): Payer: Self-pay

## 2018-04-08 ENCOUNTER — Encounter

## 2018-04-08 VITALS — BP 149/77 | HR 79

## 2018-04-08 DIAGNOSIS — M47816 Spondylosis without myelopathy or radiculopathy, lumbar region: Secondary | ICD-10-CM

## 2018-04-08 MED ORDER — METHYLPREDNISOLONE ACETATE 80 MG/ML IJ SUSP: 80.0000 mg | Freq: Once | INTRAMUSCULAR | Status: DC

## 2018-04-08 NOTE — Progress Notes (Signed)
.  Numeric Pain Rating Scale and Functional Assessment Average Pain 10   In the last MONTH (on 0-10 scale) has pain interfered with the following?  1. General activity like being  able to carry out your everyday physical activities such as walking, climbing stairs, carrying groceries, or moving a chair?  Rating(9)   +Driver, -BT, -Dye Allergies.  

## 2018-04-08 NOTE — Patient Instructions (Signed)
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
## 2018-04-09 NOTE — Procedures (Signed)
Lumbar Facet Joint Intra-Articular Injection(s) with Fluoroscopic Guidance  Patient: [name]      Date of Birth: 1932-09-13 MRN: [id number] PCP: [name], [name]      Visit Date: 04/08/2018   Universal Protocol:    Date/Time: 04/08/2018  Consent Given By: the patient  Position: PRONE   Additional Comments: Vital signs were monitored before and after the procedure. Patient was prepped and draped in the usual sterile fashion. The correct patient, procedure, and site was verified.   Injection Procedure Details:  Procedure Site One Meds Administered:  Meds ordered this encounter  Medications  . methylPREDNISolone acetate (DEPO-MEDROL) injection 80 mg     Laterality: Left  Location/Site:  L4-L5  Needle size: 22 guage  Needle type: Spinal  Needle Placement: Articular  Findings:  -Comments: Excellent flow of contrast producing a partial arthrogram.  Did not achieve any aspirated fluid.  Procedure Details: The fluoroscope beam is vertically oriented in AP, and the inferior recess is visualized beneath the lower pole of the inferior apophyseal process, which represents the target point for needle insertion. When direct visualization is difficult the target point is located at the medial projection of the vertebral pedicle. The region overlying each aforementioned target is locally anesthetized with a 1 to 2 ml. volume of 1% Lidocaine without Epinephrine.   The spinal needle was inserted into each of the above mentioned facet joints using biplanar fluoroscopic guidance. A 0.25 to 0.5 ml. volume of Isovue-250 was injected and a partial facet joint arthrogram was obtained. A single spot film was obtained of the resulting arthrogram.    One to 1.25 ml of the steroid/anesthetic solution was then injected into each of the facet joints noted above.   Additional Comments:  The patient tolerated the procedure well Dressing: Band-Aid    Post-procedure  details: Patient was observed during the procedure. Post-procedure instructions were reviewed.  Patient left the clinic in stable condition.

## 2018-04-20 DIAGNOSIS — M5127 Other intervertebral disc displacement, lumbosacral region: Secondary | ICD-10-CM | POA: Diagnosis not present

## 2018-04-20 DIAGNOSIS — M7138 Other bursal cyst, other site: Secondary | ICD-10-CM | POA: Diagnosis not present

## 2018-04-20 DIAGNOSIS — M5417 Radiculopathy, lumbosacral region: Secondary | ICD-10-CM | POA: Diagnosis not present

## 2018-05-07 ENCOUNTER — Ambulatory Visit (INDEPENDENT_AMBULATORY_CARE_PROVIDER_SITE_OTHER): Payer: Medicare HMO | Admitting: Nurse Practitioner

## 2018-05-07 ENCOUNTER — Encounter: Payer: Self-pay | Admitting: Nurse Practitioner

## 2018-05-07 VITALS — BP 123/61 | HR 82 | Temp 96.8°F | Ht 70.0 in | Wt 205.0 lb

## 2018-05-07 DIAGNOSIS — E1142 Type 2 diabetes mellitus with diabetic polyneuropathy: Secondary | ICD-10-CM | POA: Diagnosis not present

## 2018-05-07 DIAGNOSIS — E785 Hyperlipidemia, unspecified: Secondary | ICD-10-CM | POA: Diagnosis not present

## 2018-05-07 DIAGNOSIS — R319 Hematuria, unspecified: Secondary | ICD-10-CM

## 2018-05-07 DIAGNOSIS — G4733 Obstructive sleep apnea (adult) (pediatric): Secondary | ICD-10-CM | POA: Diagnosis not present

## 2018-05-07 DIAGNOSIS — Z6829 Body mass index (BMI) 29.0-29.9, adult: Secondary | ICD-10-CM

## 2018-05-07 DIAGNOSIS — I110 Hypertensive heart disease with heart failure: Secondary | ICD-10-CM

## 2018-05-07 DIAGNOSIS — R001 Bradycardia, unspecified: Secondary | ICD-10-CM | POA: Diagnosis not present

## 2018-05-07 DIAGNOSIS — E538 Deficiency of other specified B group vitamins: Secondary | ICD-10-CM

## 2018-05-07 DIAGNOSIS — D696 Thrombocytopenia, unspecified: Secondary | ICD-10-CM | POA: Diagnosis not present

## 2018-05-07 LAB — URINALYSIS, COMPLETE
Bilirubin, UA: NEGATIVE
GLUCOSE, UA: NEGATIVE
KETONES UA: NEGATIVE
Leukocytes, UA: NEGATIVE
Nitrite, UA: NEGATIVE
SPEC GRAV UA: 1.025 (ref 1.005–1.030)
UUROB: 1 mg/dL (ref 0.2–1.0)
pH, UA: 6 (ref 5.0–7.5)

## 2018-05-07 LAB — BAYER DCA HB A1C WAIVED: HB A1C (BAYER DCA - WAIVED): 7.5 % — ABNORMAL HIGH (ref <7.0)

## 2018-05-07 LAB — CMP14+EGFR
ALT: 20 IU/L (ref 0–44)
AST: 16 IU/L (ref 0–40)
Albumin/Globulin Ratio: 2.1 (ref 1.2–2.2)
Albumin: 3.9 g/dL (ref 3.5–4.7)
Alkaline Phosphatase: 69 IU/L (ref 39–117)
BUN / CREAT RATIO: 25 — AB (ref 10–24)
BUN: 21 mg/dL (ref 8–27)
Bilirubin Total: 1.4 mg/dL — ABNORMAL HIGH (ref 0.0–1.2)
CALCIUM: 9.4 mg/dL (ref 8.6–10.2)
CO2: 25 mmol/L (ref 20–29)
CREATININE: 0.83 mg/dL (ref 0.76–1.27)
Chloride: 106 mmol/L (ref 96–106)
GFR calc Af Amer: 94 mL/min/{1.73_m2} (ref >59)
GFR, EST NON AFRICAN AMERICAN: 81 mL/min/{1.73_m2} (ref >59)
GLOBULIN, TOTAL: 1.9 g/dL (ref 1.5–4.5)
GLUCOSE: 150 mg/dL — AB (ref 65–99)
Potassium: 3.9 mmol/L (ref 3.5–5.2)
SODIUM: 145 mmol/L — AB (ref 134–144)
Total Protein: 5.8 g/dL — ABNORMAL LOW (ref 6.0–8.5)

## 2018-05-07 LAB — LIPID PANEL
CHOL/HDL RATIO: 3.1 ratio (ref 0.0–5.0)
Cholesterol, Total: 104 mg/dL (ref 100–199)
HDL: 34 mg/dL — ABNORMAL LOW (ref >39)
LDL CALC: 47 mg/dL (ref 0–99)
TRIGLYCERIDES: 114 mg/dL (ref 0–149)
VLDL Cholesterol Cal: 23 mg/dL (ref 5–40)

## 2018-05-07 LAB — MICROSCOPIC EXAMINATION
BACTERIA UA: NONE SEEN
Renal Epithel, UA: NONE SEEN /hpf
WBC, UA: NONE SEEN /hpf (ref 0–5)

## 2018-05-07 NOTE — Progress Notes (Signed)
Subjective:    Patient ID: [name], male    DOB: July 18, 1933, 84 y.o.   MRN: [id number]   Chief Complaint: Diabetes (3 month follow up ); Hyperlipidemia; and Medical Management of Chronic Issues   HPI:  1. Hypertensive heart disease with congestive heart failure, unspecified heart failure type ([place])  -doesn't check BP at home   2. OSA (obstructive sleep apnea)  -has CPAP but doesn't wear it -uncomfortable -sleepy during the day, naps in rocking chair   3. Type 2 diabetes mellitus with diabetic polyneuropathy, unspecified whether long term insulin use ([facility])  -checks sugars daily, runs about 120-130s -drinks 2 (cans)  mountain dew's a day -does not watch diet, but tries to stay away from corn & potatoes  4. Hyperlipidemia with target LDL less than 100  -does not watch fat in diet  5. BMI 29.0-29.9,adult  -does not watch weight -does not weigh himself everyday   6. B12 deficiency  -does not follow up with hematologist -takes B12 supp 2-3 days/week    7. Sinus bradycardia- ? symptomatic  -doesn't notice slow heart rate -follow up appointment with Dr. Harl Bowie   8. Thrombocytopenia ([place])  -bleeds & bruises easily -     Outpatient Encounter Medications as of 05/07/2018  Medication Sig  . acetaminophen (TYLENOL 8 HOUR) 650 MG CR tablet Take 1 tablet (650 mg total) by mouth every 8 (eight) hours.  [name] albuterol (PROVENTIL HFA;VENTOLIN HFA) 108 (90 Base) MCG/ACT inhaler Inhale 2 puffs into the lungs every 6 (six) hours as needed for wheezing or shortness of breath.  [name] aspirin 81 MG tablet Take 81 mg by mouth every other day.   [name] atorvastatin (LIPITOR) 80 MG tablet Take 1 tablet (80 mg total) by mouth daily.  . budesonide-formoterol (SYMBICORT) 160-4.5 MCG/ACT inhaler Inhale 2 puffs into the lungs 2 (two) times daily.  . busPIRone (BUSPAR) 10 MG tablet Take 1 tablet (10 mg total) by mouth 3 (three) times daily.  [name] glucose blood (ONETOUCH VERIO) test strip Use to check blood  sugars daily  . guaiFENesin (MUCINEX) 600 MG 12 hr tablet Take 1 tablet by mouth daily.   [name] ibuprofen (ADVIL,MOTRIN) 600 MG tablet 1 po bid x 2 weeks  . Lancets (ONETOUCH ULTRASOFT) lancets Use to check blood sugars daily  . meclizine (ANTIVERT) 12.5 MG tablet Take 1 tablet (12.5 mg total) 3 (three) times daily as needed by mouth for dizziness.  . metFORMIN (GLUCOPHAGE) 500 MG tablet TAKE 1 TABLET (500 MG TOTAL) BY MOUTH 2 (TWO) TIMES DAILY WITH A MEAL.  . methocarbamol (ROBAXIN) 500 MG tablet Take 1 tablet (500 mg total) by mouth every 8 (eight) hours as needed for muscle spasms.  [name] omeprazole (PRILOSEC) 20 MG capsule Take 1 capsule (20 mg total) by mouth daily.  . predniSONE (DELTASONE) 10 MG tablet Take 5 PO x 3 days, then 4 PO x 3 days, then 3 PO x 3 days, then 2 PO x 3 days then 1 PO x 3 days.  . tamsulosin (FLOMAX) 0.4 MG CAPS capsule Take 1 capsule (0.4 mg total) by mouth daily after supper.  . traMADol (ULTRAM) 50 MG tablet Take 1 tablet (50 mg total) by mouth 2 (two) times daily. (Patient not taking: Reported on 04/08/2018)   Facility-Administered Encounter Medications as of 05/07/2018  Medication  . methylPREDNISolone acetate (DEPO-MEDROL) injection 80 mg    New complaints: Hematuria-started Monday with every void, bright red. Stops when stream stops.  No pain associated with  urinating, not taken any medicine for it, nothing makes it better worse, urinating less during the day,  Depression- wife says that he is depressed and needs meds. Depression screen [facility] 2/9 05/07/2018 01/15/2018 10/15/2017 10/07/2017 10/01/2017  Decreased Interest 0 0 0 0 1  Down, Depressed, Hopeless 0 1 0 1 0  PHQ - 2 Score 0 1 0 1 1  Altered sleeping - - - - -  Tired, decreased energy - - - - -  Change in appetite - - - - -  Feeling bad or failure about yourself  - - - - -  Trouble concentrating - - - - -  Moving slowly or fidgety/restless - - - - -  Suicidal thoughts - - - - -  PHQ-9 Score - - - - -      Social history: -retired, married, 4 girls    Review of Systems  Constitutional: Negative.   HENT: Negative.   Eyes: Positive for discharge (clear).  Respiratory: Positive for cough (nonproductive).   Cardiovascular: Negative for chest pain, palpitations and leg swelling.  Gastrointestinal: Negative.   Genitourinary: Positive for discharge (bloody) and hematuria.  Musculoskeletal: Positive for gait problem (walks with cane) and neck stiffness (decreased neck ROM).  Skin: Negative for color change (bruises).  Allergic/Immunologic: Negative.   Neurological: Negative for dizziness, numbness and headaches.  Hematological: Bruises/bleeds easily.  Psychiatric/Behavioral: Positive for agitation. Behavioral problem: wife says patient is depressed.       Objective:   Physical Exam  Constitutional: He is oriented to person, place, and time. He appears well-developed and well-nourished. He is cooperative.  HENT:  Head: Normocephalic and atraumatic.  Right Ear: Tympanic membrane and external ear normal.  Left Ear: Tympanic membrane and external ear normal.  Nose: Nose normal.  Mouth/Throat: Oropharynx is clear and moist and mucous membranes are normal. He has dentures.  Eyes: Pupils are equal, round, and reactive to light. Conjunctivae, EOM and lids are normal.  Neck: Trachea normal, normal range of motion and phonation normal. Neck supple.  Cardiovascular: Normal rate, regular rhythm, normal heart sounds, intact distal pulses and normal pulses.  Pulmonary/Chest: Effort normal and breath sounds normal.  Abdominal: Bowel sounds are normal. He exhibits distension (abdominal hernia present). There is tenderness (RLQ) in the right lower quadrant. A hernia is present.  Musculoskeletal:       Cervical back: He exhibits decreased range of motion.  Neurological: He is alert and oriented to person, place, and time. He has normal strength and normal reflexes. Gait (walks with a cane)  abnormal.  Skin: Skin is warm, dry and intact.  Thickened toenails on bilateral feet   Psychiatric: He has a normal mood and affect. His speech is normal and behavior is normal. Thought content normal.     BP 123/61   Pulse 82   Temp (!) 96.8 F (36 C) (Oral)   Ht '5\' 10"'$  (1.778 m)   Wt 205 lb (93 kg)   BMI 29.41 kg/m       Assessment & Plan:  [name] comes in today with chief complaint of Medical Management of Chronic Issues   Diagnosis and orders addressed:  1. Hypertensive heart disease with congestive heart failure, unspecified heart failure type ([place]) -follow up with Dr. Harl Bowie - CMP14+EGFR -Check BP at home -encouraged to check weights daily  2. OSA (obstructive sleep apnea) -patient refuses  3. Type 2 diabetes mellitus with diabetic polyneuropathy, unspecified whether long term insulin use ([place]) - [facility]  Hb A1c Waived -counseled on nutritional changes including stricter carb counting  4. Hyperlipidemia with target LDL less than 100 - Lipid panel -counseled on nutrition and lifestyle changes  5. BMI 29.0-29.9,adult -weight loss encouraged -nutrition and exercise counseling   6. B12 deficiency -check B12 -encouraged to take Vit B12 pill daily  7. Sinus bradycardia- ? symptomatic -encouraged to check BP & pulse at home -follow up with Dr. Harl Bowie   8. Thrombocytopenia ([place]) -keep future follow up with Hemetology  9. Hematuria, unspecified type Urology referral - Urinalysis, Complete   Labs pending Health Maintenance reviewed Diet and exercise encouraged  Follow up plan: 3 months   [name] [name], [name]

## 2018-05-07 NOTE — Patient Instructions (Signed)

## 2018-05-12 ENCOUNTER — Telehealth: Payer: Self-pay | Admitting: Cardiology

## 2018-05-12 LAB — VITAMIN B12: Vitamin B-12: 509 pg/mL (ref 232–1245)

## 2018-05-12 LAB — SPECIMEN STATUS REPORT

## 2018-05-12 NOTE — Telephone Encounter (Signed)
Numerous attempts to contact patient with recall letters. Unable to reach by telephone. with no success.   09/28/2017 1:46 PM New [10]    [System] 12/23/2017 11:02 PM Notification Sent [20]   Chanda Busing [[id number]] 04/08/2018 5:08 PM Notification Sent [20]   Chanda Busing [[id number]] 05/12/2018 12:19 PM Notification Sent [20]

## 2018-05-28 DIAGNOSIS — R69 Illness, unspecified: Secondary | ICD-10-CM | POA: Diagnosis not present

## 2018-06-16 DIAGNOSIS — H524 Presbyopia: Secondary | ICD-10-CM | POA: Diagnosis not present

## 2018-06-16 DIAGNOSIS — I10 Essential (primary) hypertension: Secondary | ICD-10-CM | POA: Diagnosis not present

## 2018-06-16 DIAGNOSIS — Z0100 Encounter for examination of eyes and vision without abnormal findings: Secondary | ICD-10-CM | POA: Diagnosis not present

## 2018-06-16 DIAGNOSIS — H2510 Age-related nuclear cataract, unspecified eye: Secondary | ICD-10-CM | POA: Diagnosis not present

## 2018-06-16 LAB — HM DIABETES EYE EXAM

## 2018-07-26 DIAGNOSIS — Z029 Encounter for administrative examinations, unspecified: Secondary | ICD-10-CM

## 2018-08-01 ENCOUNTER — Other Ambulatory Visit: Payer: Self-pay | Admitting: Nurse Practitioner

## 2018-08-01 DIAGNOSIS — N3946 Mixed incontinence: Secondary | ICD-10-CM

## 2018-08-01 DIAGNOSIS — E1142 Type 2 diabetes mellitus with diabetic polyneuropathy: Secondary | ICD-10-CM

## 2018-08-03 ENCOUNTER — Other Ambulatory Visit: Payer: Self-pay | Admitting: Nurse Practitioner

## 2018-08-03 DIAGNOSIS — N3946 Mixed incontinence: Secondary | ICD-10-CM

## 2018-08-05 ENCOUNTER — Ambulatory Visit: Payer: Medicare HMO | Admitting: Cardiology

## 2018-08-05 ENCOUNTER — Encounter: Payer: Self-pay | Admitting: Cardiology

## 2018-08-05 VITALS — BP 148/70 | HR 52 | Ht 70.0 in | Wt 213.0 lb

## 2018-08-05 DIAGNOSIS — E782 Mixed hyperlipidemia: Secondary | ICD-10-CM | POA: Diagnosis not present

## 2018-08-05 DIAGNOSIS — I2510 Atherosclerotic heart disease of native coronary artery without angina pectoris: Secondary | ICD-10-CM | POA: Diagnosis not present

## 2018-08-05 DIAGNOSIS — R0602 Shortness of breath: Secondary | ICD-10-CM

## 2018-08-05 DIAGNOSIS — I10 Essential (primary) hypertension: Secondary | ICD-10-CM | POA: Diagnosis not present
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
## 2018-08-05 NOTE — Patient Instructions (Signed)
Your physician recommends that you schedule a follow-up appointment in: [place] has recommended you make the following change in your medication:   START ASPIRIN 51 MG DAILY   Your physician has requested that you have an echocardiogram. Echocardiography is a painless test that uses sound waves to create images of your heart. It provides your doctor with information about the size and shape of your heart and how well your heart's chambers and valves are working. This procedure takes approximately one hour. There are no restrictions for this procedure.  Thank you for choosing [facility]!!

## 2018-08-16 ENCOUNTER — Ambulatory Visit (INDEPENDENT_AMBULATORY_CARE_PROVIDER_SITE_OTHER): Payer: Medicare HMO

## 2018-08-16 ENCOUNTER — Ambulatory Visit (INDEPENDENT_AMBULATORY_CARE_PROVIDER_SITE_OTHER): Payer: Medicare HMO | Admitting: Nurse Practitioner

## 2018-08-16 ENCOUNTER — Encounter: Payer: Self-pay | Admitting: Nurse Practitioner

## 2018-08-16 ENCOUNTER — Other Ambulatory Visit: Payer: Self-pay

## 2018-08-16 VITALS — BP 144/76 | HR 69 | Temp 96.5°F | Ht 70.0 in | Wt 216.0 lb

## 2018-08-16 DIAGNOSIS — R0602 Shortness of breath: Secondary | ICD-10-CM | POA: Diagnosis not present

## 2018-08-16 DIAGNOSIS — E1142 Type 2 diabetes mellitus with diabetic polyneuropathy: Secondary | ICD-10-CM

## 2018-08-16 DIAGNOSIS — E785 Hyperlipidemia, unspecified: Secondary | ICD-10-CM | POA: Diagnosis not present

## 2018-08-16 DIAGNOSIS — G4733 Obstructive sleep apnea (adult) (pediatric): Secondary | ICD-10-CM | POA: Diagnosis not present

## 2018-08-16 DIAGNOSIS — R001 Bradycardia, unspecified: Secondary | ICD-10-CM | POA: Diagnosis not present

## 2018-08-16 DIAGNOSIS — J410 Simple chronic bronchitis: Secondary | ICD-10-CM

## 2018-08-16 DIAGNOSIS — C61 Malignant neoplasm of prostate: Secondary | ICD-10-CM | POA: Diagnosis not present

## 2018-08-16 DIAGNOSIS — D696 Thrombocytopenia, unspecified: Secondary | ICD-10-CM | POA: Diagnosis not present

## 2018-08-16 DIAGNOSIS — Z6829 Body mass index (BMI) 29.0-29.9, adult: Secondary | ICD-10-CM | POA: Diagnosis not present

## 2018-08-16 LAB — BAYER DCA HB A1C WAIVED: HB A1C (BAYER DCA - WAIVED): 6.5 % (ref <7.0)

## 2018-08-16 NOTE — Addendum Note (Signed)
Addended by: [name] on: 08/16/2018 09:37 AM   Modules accepted: Orders

## 2018-08-16 NOTE — Progress Notes (Signed)
Subjective:    Patient ID: [name], male    DOB: 1933/07/07, 84 y.o.   MRN: [id number]   Chief Complaint: medical management of chronic issues  HPI:  1. Sinus bradycardia- ? symptomatic  He denies any syncopal or near syncopal episodes. No dizziness.  2. OSA (obstructive sleep apnea)  Does not have CPAP. Did not like it when he was diagnosed years ago. Would like to do now. Will have to schedule sleep study.  3. Type 2 diabetes mellitus with diabetic polyneuropathy, unspecified whether long term insulin use ([place]) last hgba1c was 7.5%. no changes were made to meds. Discussed low carb diet. Checks blood sugars every morning. Averages in 120 fasting  4. Prostate cancer [facility])  Has completed all his treatments. Denies any problems voiding  5. Thrombocytopenia (HCC)  Last platlet count was 107. We will recheck today. Denies any brusing or rashes  6. Hyperlipidemia with target LDL less than 100  Does not watch diet  7. BMI 29.0-29.9,adult  No recent weight chnages    Outpatient Encounter Medications as of 08/16/2018  Medication Sig  . albuterol (PROVENTIL HFA;VENTOLIN HFA) 108 (90 Base) MCG/ACT inhaler Inhale 2 puffs into the lungs every 6 (six) hours as needed for wheezing or shortness of breath.  [name] aspirin EC 81 MG tablet Take 81 mg by mouth daily.  [name] atorvastatin (LIPITOR) 80 MG tablet Take 1 tablet (80 mg total) by mouth daily.  . budesonide-formoterol (SYMBICORT) 160-4.5 MCG/ACT inhaler Inhale 2 puffs into the lungs 2 (two) times daily.  [name] glucose blood (ONETOUCH VERIO) test strip Use to check blood sugars daily  . Lancets (ONETOUCH ULTRASOFT) lancets Use to check blood sugars daily  . metFORMIN (GLUCOPHAGE) 500 MG tablet TAKE 1 TABLET (500 MG TOTAL) BY MOUTH 2 (TWO) TIMES DAILY WITH A MEAL.  [name] Misc Natural Products (OSTEO BI-FLEX ADV TRIPLE ST PO) Take by mouth.  . naproxen sodium (ALEVE) 220 MG tablet Take 220 mg by mouth daily as needed.  [name] omeprazole (PRILOSEC) 20 MG  capsule Take 1 capsule (20 mg total) by mouth daily.  . tamsulosin (FLOMAX) 0.4 MG CAPS capsule TAKE 1 CAPSULE (0.4 MG TOTAL) BY MOUTH DAILY AFTER SUPPER.     New complaints: Has dyspnea on exertion. He has been put on symbicort and that really helps him.   Social history: Lives with wife. Daughters check on them daily   Review of Systems  Constitutional: Negative for activity change and appetite change.  HENT: Negative.   Eyes: Negative for pain.  Respiratory: Negative for shortness of breath.   Cardiovascular: Negative for chest pain, palpitations and leg swelling.  Gastrointestinal: Negative for abdominal pain.  Endocrine: Negative for polydipsia.  Genitourinary: Negative.   Skin: Negative for rash.  Neurological: Negative for dizziness, weakness and headaches.  Hematological: Does not bruise/bleed easily.  Psychiatric/Behavioral: Negative.   All other systems reviewed and are negative.      Objective:   Physical Exam Vitals signs and nursing note reviewed.  Constitutional:      Appearance: Normal appearance. He is well-developed.  HENT:     Head: Normocephalic.     Nose: Nose normal.  Eyes:     Pupils: Pupils are equal, round, and reactive to light.  Neck:     Musculoskeletal: Normal range of motion and neck supple.     Thyroid: No thyroid mass or thyromegaly.     Vascular: No carotid bruit or JVD.     Trachea: Phonation normal.  Cardiovascular:     Rate and Rhythm: Normal rate and regular rhythm.  Pulmonary:     Effort: Pulmonary effort is normal. No respiratory distress.     Breath sounds: Normal breath sounds.  Abdominal:     General: Bowel sounds are normal.     Palpations: Abdomen is soft.     Tenderness: There is no abdominal tenderness.  Musculoskeletal: Normal range of motion.  Lymphadenopathy:     Cervical: No cervical adenopathy.  Skin:    General: Skin is warm and dry.  Neurological:     Mental Status: He is alert and oriented to person,  place, and time.  Psychiatric:        Behavior: Behavior normal.        Thought Content: Thought content normal.        Judgment: Judgment normal.    BP (!) 144/76   Pulse 69   Temp (!) 96.5 F (35.8 C) (Oral)   Ht _0  (1.778 m)   Wt 216 lb (98 kg)   SpO2 99%   BMI 30.99 kg/m        Assessment & Plan:  [name] comes in today with chief complaint of Medical Management of Chronic Issues   Diagnosis and orders addressed:  1. Sinus bradycardia- ? symptomatic Let us know if has any syncopial episodes  2. OSA (obstructive sleep apnea) Will schedule sleep syudy  3. Type 2 diabetes mellitus with diabetic polyneuropathy, unspecified whether long term insulin use (HCC) Continue to watch carbs in diet - Bayer DCA Hb A1c Waived - Microalbumin / creatinine urine ratio  4. Prostate cancer ([place])  5. Thrombocytopenia ([place]) Labs pending  6. Hyperlipidemia with target LDL less than 100 Low fat diet - CMP14+EGFR - Lipid panel  7. BMI 29.0-29.9,adult Discussed diet and exercise for person with BMI >25 Will recheck weight in 3-6 months  8. Simple chronic bronchitis (HCC) symbicort daily Only use albuterol as needed   Labs pending Health Maintenance reviewed Diet and exercise encouraged  Follow up plan: 3 months   [name] [name], [name]

## 2018-08-16 NOTE — Patient Instructions (Signed)

## 2018-08-17 LAB — CMP14+EGFR
ALT: 19 IU/L (ref 0–44)
AST: 16 IU/L (ref 0–40)
Albumin/Globulin Ratio: 2 (ref 1.2–2.2)
Albumin: 4 g/dL (ref 3.5–4.7)
Alkaline Phosphatase: 83 IU/L (ref 39–117)
BUN/Creatinine Ratio: 16 (ref 10–24)
BUN: 14 mg/dL (ref 8–27)
Bilirubin Total: 0.9 mg/dL (ref 0.0–1.2)
CO2: 25 mmol/L (ref 20–29)
Calcium: 9.4 mg/dL (ref 8.6–10.2)
Chloride: 105 mmol/L (ref 96–106)
Creatinine, Ser: 0.89 mg/dL (ref 0.76–1.27)
GFR calc Af Amer: 91 mL/min/{1.73_m2} (ref >59)
GFR calc non Af Amer: 79 mL/min/{1.73_m2} (ref >59)
Globulin, Total: 2 g/dL (ref 1.5–4.5)
Glucose: 141 mg/dL — ABNORMAL HIGH (ref 65–99)
POTASSIUM: 4.2 mmol/L (ref 3.5–5.2)
Sodium: 146 mmol/L — ABNORMAL HIGH (ref 134–144)
Total Protein: 6 g/dL (ref 6.0–8.5)

## 2018-08-17 LAB — ECHOCARDIOGRAM COMPLETE
Height: 70 in
WEIGHTICAEL: 3456 [oz_av]

## 2018-08-17 LAB — CBC WITH DIFFERENTIAL/PLATELET
Basophils Absolute: 0 10*3/uL (ref 0.0–0.2)
Basos: 0 %
EOS (ABSOLUTE): 0.2 10*3/uL (ref 0.0–0.4)
Eos: 2 %
Hematocrit: 40.1 % (ref 37.5–51.0)
Hemoglobin: 13.3 g/dL (ref 13.0–17.7)
Immature Grans (Abs): 0 10*3/uL (ref 0.0–0.1)
Immature Granulocytes: 1 %
Lymphocytes Absolute: 1 10*3/uL (ref 0.7–3.1)
Lymphs: 15 %
MCH: 30.8 pg (ref 26.6–33.0)
MCHC: 33.2 g/dL (ref 31.5–35.7)
MCV: 93 fL (ref 79–97)
Monocytes Absolute: 0.7 10*3/uL (ref 0.1–0.9)
Monocytes: 11 %
Neutrophils Absolute: 4.6 10*3/uL (ref 1.4–7.0)
Neutrophils: 71 %
Platelets: 89 10*3/uL — CL (ref 150–450)
RBC: 4.32 x10E6/uL (ref 4.14–5.80)
RDW: 14.4 % (ref 12.3–15.4)
WBC: 6.5 10*3/uL (ref 3.4–10.8)

## 2018-08-17 LAB — LIPID PANEL
Chol/HDL Ratio: 2.6 ratio (ref 0.0–5.0)
Cholesterol, Total: 100 mg/dL (ref 100–199)
HDL: 39 mg/dL — AB (ref >39)
LDL Calculated: 43 mg/dL (ref 0–99)
Triglycerides: 91 mg/dL (ref 0–149)
VLDL Cholesterol Cal: 18 mg/dL (ref 5–40)

## 2018-08-17 LAB — MICROALBUMIN / CREATININE URINE RATIO
Creatinine, Urine: 130.1 mg/dL
Microalb/Creat Ratio: 23.4 mg/g creat (ref 0.0–30.0)
Microalbumin, Urine: 30.4 ug/mL

## 2018-08-19 ENCOUNTER — Ambulatory Visit (INDEPENDENT_AMBULATORY_CARE_PROVIDER_SITE_OTHER): Payer: Medicare HMO

## 2018-08-19 ENCOUNTER — Encounter: Payer: Self-pay | Admitting: Family Medicine

## 2018-08-19 ENCOUNTER — Ambulatory Visit (INDEPENDENT_AMBULATORY_CARE_PROVIDER_SITE_OTHER): Payer: Medicare HMO | Admitting: Family Medicine

## 2018-08-19 VITALS — BP 139/79 | HR 72 | Temp 98.3°F | Ht 70.0 in | Wt 211.4 lb

## 2018-08-19 DIAGNOSIS — R059 Cough, unspecified: Secondary | ICD-10-CM

## 2018-08-19 DIAGNOSIS — R05 Cough: Secondary | ICD-10-CM

## 2018-08-19 DIAGNOSIS — J329 Chronic sinusitis, unspecified: Secondary | ICD-10-CM | POA: Diagnosis not present

## 2018-08-19 DIAGNOSIS — J40 Bronchitis, not specified as acute or chronic: Secondary | ICD-10-CM | POA: Diagnosis not present

## 2018-08-19 MED ORDER — MOXIFLOXACIN HCL 400 MG PO TABS: 400.0000 mg | ORAL_TABLET | Freq: Every day | ORAL | 0 refills | Status: DC

## 2018-08-19 MED ORDER — HYDROCODONE-HOMATROPINE 5-1.5 MG/5ML PO SYRP: 5.0000 mL | ORAL_SOLUTION | Freq: Four times a day (QID) | ORAL | 0 refills | Status: AC | PRN

## 2018-08-19 MED ORDER — BENZONATATE 200 MG PO CAPS: 200.0000 mg | ORAL_CAPSULE | Freq: Three times a day (TID) | ORAL | 0 refills | Status: DC | PRN

## 2018-08-19 NOTE — Progress Notes (Signed)
Chief Complaint  Patient presents with  . Cough    x 2 days  . Nasal Congestion    HPI  Patient presents today for Two days of profuse coughing. Worse at night. Dyspneic for walking 200 feet. No fever. Appetite good. Hx of COPD.  PMH: Smoking status noted ROS: Per HPI  Objective: BP 139/79   Pulse 72   Temp 98.3 F (36.8 C) (Oral)   Ht 5\' 10"  (1.778 m)   Wt 211 lb 6.4 oz (95.9 kg)   SpO2 92%   BMI 30.33 kg/m  Gen: NAD, alert, cooperative with exam HEENT: NCAT, EOMI, PERRL CV: RRR, good S1/S2, no murmur Resp: Scxattered rhonchi.  Abd: SNTND, BS present, no guarding or organomegaly Ext: No edema, warm Neuro: Alert and oriented, No gross deficits CXR : no acute infiltrate noted Assessment and plan:  1. Cough   2. Sinobronchitis     Meds ordered this encounter  Medications  . HYDROcodone-homatropine (HYCODAN) 5-1.5 MG/5ML syrup    Sig: Take 5 mLs by mouth every 6 (six) hours as needed for up to 5 days for cough.    Dispense:  100 mL    Refill:  0  . moxifloxacin (AVELOX) 400 MG tablet    Sig: Take 1 tablet (400 mg total) by mouth daily. Take all of these, for infection    Dispense:  7 tablet    Refill:  0  . benzonatate (TESSALON) 200 MG capsule    Sig: Take 1 capsule (200 mg total) by mouth 3 (three) times daily as needed for cough.    Dispense:  20 capsule    Refill:  0    Orders Placed This Encounter  Procedures  . DG Chest 2 View    Standing Status:   Future    Number of Occurrences:   1    Standing Expiration Date:   10/19/2019    Order Specific Question:   Reason for Exam (SYMPTOM  OR DIAGNOSIS REQUIRED)    Answer:   cough    Order Specific Question:   Preferred imaging location?    Answer:   Internal    Follow up as needed.  Claretta Fraise, MD

## 2018-08-26 ENCOUNTER — Encounter: Payer: Self-pay | Admitting: *Deleted

## 2018-08-26 ENCOUNTER — Telehealth: Payer: Self-pay | Admitting: *Deleted

## 2018-08-26 ENCOUNTER — Telehealth: Payer: Self-pay | Admitting: Cardiology

## 2018-08-26 DIAGNOSIS — R0602 Shortness of breath: Secondary | ICD-10-CM

## 2018-08-26 NOTE — Telephone Encounter (Signed)
Pre-cert Verification for the following procedure   lexiscan scheduled for 09/03/2018 at [name]

## 2018-08-26 NOTE — Telephone Encounter (Signed)
-----   Message from Arnoldo Lenis, MD sent at 08/23/2018  7:55 AM EST ----- Echo overall looks good, heart function remains in the low normal range. Please order a lexiscan for shortness of breath, does not need to hold any meds   Zandra Abts MD

## 2018-08-26 NOTE — Telephone Encounter (Signed)
Pt wife [name]) made aware and agreeable to stress test - will place orders and forward to schedulers - routed to pcp

## 2018-09-01 ENCOUNTER — Encounter: Payer: Self-pay | Admitting: Family Medicine

## 2018-09-01 ENCOUNTER — Ambulatory Visit (INDEPENDENT_AMBULATORY_CARE_PROVIDER_SITE_OTHER): Payer: Medicare Other | Admitting: Family Medicine

## 2018-09-01 ENCOUNTER — Ambulatory Visit (INDEPENDENT_AMBULATORY_CARE_PROVIDER_SITE_OTHER): Payer: Medicare Other

## 2018-09-01 VITALS — BP 139/84 | HR 82 | Temp 99.9°F | Ht 70.0 in | Wt 214.0 lb

## 2018-09-01 DIAGNOSIS — R059 Cough, unspecified: Secondary | ICD-10-CM

## 2018-09-01 DIAGNOSIS — R05 Cough: Secondary | ICD-10-CM

## 2018-09-01 DIAGNOSIS — J40 Bronchitis, not specified as acute or chronic: Secondary | ICD-10-CM | POA: Diagnosis not present

## 2018-09-01 DIAGNOSIS — J329 Chronic sinusitis, unspecified: Secondary | ICD-10-CM | POA: Diagnosis not present

## 2018-09-01 MED ORDER — BENZONATATE 200 MG PO CAPS: 200.0000 mg | ORAL_CAPSULE | Freq: Three times a day (TID) | ORAL | 0 refills | Status: DC | PRN

## 2018-09-01 MED ORDER — HYDROCODONE-HOMATROPINE 5-1.5 MG/5ML PO SYRP: 5.0000 mL | ORAL_SOLUTION | ORAL | 0 refills | Status: DC | PRN

## 2018-09-01 MED ORDER — LEVOFLOXACIN 500 MG PO TABS: 500.0000 mg | ORAL_TABLET | Freq: Every day | ORAL | 0 refills | Status: DC

## 2018-09-01 MED ORDER — BETAMETHASONE SOD PHOS & ACET 6 (3-3) MG/ML IJ SUSP
6.0000 mg | Freq: Once | INTRAMUSCULAR | Status: AC
  Administered 2018-09-01: 6 mg via INTRAMUSCULAR

## 2018-09-01 NOTE — Progress Notes (Signed)
Chief Complaint  Patient presents with  . URI    cough, chest congestion; ongoing for about 3 weeks    HPI  Patient presents today for trials of multiple OTCs and two different antibiotics given without redolution.   PMH: Smoking status noted ROS: Per HPI  Objective: BP 139/84   Pulse 82   Temp 99.9 F (37.7 C) (Oral)   Ht 5\' 10"  (1.778 m)   Wt 214 lb (97.1 kg)   SpO2 96%   BMI 30.71 kg/m  Gen: NAD, alert, cooperative with exam HEENT: NCAT, EOMI, PERRL CV: RRR, good S1/S2, no murmur Resp: CTABL, no wheezes, non-labored Abd: SNTND, BS present, no guarding or organomegaly Ext: No edema, warm Neuro: Alert and oriented, No gross deficits  Assessment and plan:  1. Cough   2. Sinobronchitis     Meds ordered this encounter  Medications  . betamethasone acetate-betamethasone sodium phosphate (CELESTONE) injection 6 mg  . DISCONTD: levofloxacin (LEVAQUIN) 500 MG tablet    Sig: Take 1 tablet (500 mg total) by mouth daily. For 10 days    Dispense:  10 tablet    Refill:  0  . DISCONTD: HYDROcodone-homatropine (HYCODAN) 5-1.5 MG/5ML syrup    Sig: Take 5 mLs by mouth every 4 (four) hours as needed for up to 5 days for cough.    Dispense:  120 mL    Refill:  0  . DISCONTD: benzonatate (TESSALON) 200 MG capsule    Sig: Take 1 capsule (200 mg total) by mouth 3 (three) times daily as needed for cough.    Dispense:  20 capsule    Refill:  0    Orders Placed This Encounter  Procedures  . DG Chest 2 View    Standing Status:   Future    Number of Occurrences:   1    Standing Expiration Date:   11/01/2019    Order Specific Question:   Reason for Exam (SYMPTOM  OR DIAGNOSIS REQUIRED)    Answer:   cough, chest congestion    Order Specific Question:   Preferred imaging location?    Answer:   Internal    Follow up as needed.  Claretta Fraise, MD

## 2018-09-03 ENCOUNTER — Encounter (HOSPITAL_COMMUNITY): Payer: Self-pay

## 2018-09-03 ENCOUNTER — Encounter (HOSPITAL_COMMUNITY)
Admission: RE | Admit: 2018-09-03 | Discharge: 2018-09-03 | Disposition: A | Discharge status: Home / Self Care | Payer: Medicare Other | Source: Ambulatory Visit | Attending: Cardiology | Admitting: Cardiology

## 2018-09-03 ENCOUNTER — Encounter (HOSPITAL_BASED_OUTPATIENT_CLINIC_OR_DEPARTMENT_OTHER)
Admission: RE | Admit: 2018-09-03 | Discharge: 2018-09-03 | Disposition: A | Discharge status: Home / Self Care | Payer: Medicare Other | Source: Ambulatory Visit | Attending: Cardiology | Admitting: Cardiology

## 2018-09-03 DIAGNOSIS — R0602 Shortness of breath: Secondary | ICD-10-CM | POA: Insufficient documentation

## 2018-09-03 LAB — NM MYOCAR MULTI W/SPECT W/WALL MOTION / EF
LV dias vol: 251 mL (ref 62–150)
LV sys vol: 147 mL
Peak HR: 87 {beats}/min
RATE: 0.31
Rest HR: 73 {beats}/min
SDS: 1
SRS: 5
SSS: 6
TID: 1.09

## 2018-09-03 MED ORDER — TECHNETIUM TC 99M TETROFOSMIN IV KIT
30.0000 | PACK | Freq: Once | INTRAVENOUS | Status: AC | PRN
  Administered 2018-09-03: 29.5 via INTRAVENOUS

## 2018-09-03 MED ORDER — REGADENOSON 0.4 MG/5ML IV SOLN
INTRAVENOUS | Status: AC
  Administered 2018-09-03: 0.4 mg via INTRAVENOUS
  Filled 2018-09-03: qty 5

## 2018-09-03 MED ORDER — SODIUM CHLORIDE 0.9% FLUSH
INTRAVENOUS | Status: AC
  Administered 2018-09-03: 10 mL via INTRAVENOUS
  Filled 2018-09-03: qty 10

## 2018-09-03 MED ORDER — TECHNETIUM TC 99M TETROFOSMIN IV KIT
10.0000 | PACK | Freq: Once | INTRAVENOUS | Status: AC | PRN
  Administered 2018-09-03: 10.5 via INTRAVENOUS

## 2018-09-06 ENCOUNTER — Ambulatory Visit (INDEPENDENT_AMBULATORY_CARE_PROVIDER_SITE_OTHER): Payer: Medicare Other | Admitting: Nurse Practitioner

## 2018-09-06 ENCOUNTER — Encounter: Payer: Self-pay | Admitting: Nurse Practitioner

## 2018-09-06 VITALS — BP 144/76 | HR 75 | Temp 96.5°F | Ht 70.0 in | Wt 207.0 lb

## 2018-09-06 DIAGNOSIS — J440 Chronic obstructive pulmonary disease with acute lower respiratory infection: Secondary | ICD-10-CM

## 2018-09-06 DIAGNOSIS — J209 Acute bronchitis, unspecified: Secondary | ICD-10-CM

## 2018-09-06 MED ORDER — PREDNISONE 20 MG PO TABS: ORAL_TABLET | ORAL | 0 refills | Status: DC

## 2018-09-06 NOTE — Progress Notes (Signed)
   Subjective:    Patient ID: [name], male    DOB: 1933/01/15, 85 y.o.   MRN: [id number]   Chief Complaint: Cough and Nasal Congestion   HPI Patient is brought in by daughter with c/o cough and congestion. Started day before christmas. He feels some better then he did when it started but he cannot get completely rid of it. Cough is productive. He saw DR. Stacks on 09/01/18. He was put on avelox, cough meds and steroid shot.. Chest xray negative.   Review of Systems  Constitutional: Negative for chills and fever.  HENT: Positive for congestion, rhinorrhea and sinus pain. Negative for sore throat and trouble swallowing.   Respiratory: Positive for cough (productive) and shortness of breath (with exertion).   Cardiovascular: Negative.   Neurological: Negative.   Psychiatric/Behavioral: Negative.   All other systems reviewed and are negative.      Objective:   Physical Exam Constitutional:      General: He is not in acute distress.    Appearance: He is obese.  Cardiovascular:     Rate and Rhythm: Normal rate and regular rhythm.     Pulses: Normal pulses.     Heart sounds: Normal heart sounds.  Pulmonary:     Effort: Pulmonary effort is normal.     Breath sounds: Wheezing (faint wheezes in bil bases) present.  Skin:    General: Skin is warm.  Neurological:     General: No focal deficit present.     Mental Status: He is alert.  Psychiatric:        Mood and Affect: Mood normal.    BP (!) 144/76   Pulse 75   Temp (!) 96.5 F (35.8 C) (Oral)   Ht 5\' 10"  (1.778 m)   Wt 207 lb (93.9 kg)   SpO2 97%   BMI 29.70 kg/m        Assessment & Plan:  [name] in today with chief complaint of Cough and Nasal Congestion   1. Acute bronchitis with COPD ([place]) Force fluids Rest Continue inhalers as rx Watch blood sugars because will go up with prednisone. rto prn - predniSONE (DELTASONE) 20 MG tablet; 2 po at sametime daily for 5 days  Dispense: 10 tablet; Refill:  0  [name] [name], [name]

## 2018-09-06 NOTE — Patient Instructions (Signed)

## 2018-09-08 ENCOUNTER — Telehealth: Payer: Self-pay | Admitting: *Deleted

## 2018-09-08 NOTE — Telephone Encounter (Signed)
Pt aware - routed to pcp  

## 2018-09-08 NOTE — Telephone Encounter (Signed)
-----   Message from Arnoldo Lenis, MD sent at 09/06/2018  4:28 PM EST ----- Stress test looks good, no evidence of significant new blockages. Overall heart testing has been reassuing. Discuss everything at his f/u in March  J Branch MD

## 2018-09-12 ENCOUNTER — Encounter: Payer: Self-pay | Admitting: Family Medicine

## 2018-09-22 ENCOUNTER — Encounter: Payer: Medicare Other | Admitting: Neurology

## 2018-09-22 DIAGNOSIS — G4733 Obstructive sleep apnea (adult) (pediatric): Secondary | ICD-10-CM | POA: Diagnosis not present

## 2018-10-06 NOTE — Procedures (Signed)
  [place]akes A. Merlene Laughter, MD     [email or web address]             HOME SLEEP STUDY  LOCATION: [facility]  Patient Name: [name], [name] Date: 09/22/2018 Gender: Male D.O.B: [birth year] Age (years): 24 Referring Provider: Ronnald Collum Height (inches): 29 Interpreting Physician: Phillips Odor MD, ABSM Weight (lbs): 207 RPSGT: Peak, Robert BMI: 30 MRN: [id number] Neck Size: CLINICAL INFORMATION Sleep Study Type: HST     Indication for sleep study: OSA     Epworth Sleepiness Score: NA  SLEEP STUDY TECHNIQUE A multi-channel overnight portable sleep study was performed. The channels recorded were: nasal airflow, thoracic respiratory movement, and oxygen saturation with a pulse oximetry. Snoring was also monitored.  MEDICATIONS Patient self administered medications include: N/A.  Current Outpatient Medications:  .  albuterol (PROVENTIL HFA;VENTOLIN HFA) 108 (90 Base) MCG/ACT inhaler, Inhale 2 puffs into the lungs every 6 (six) hours as needed for wheezing or shortness of breath., Disp: 1 Inhaler, Rfl: 2 .  aspirin EC 81 MG tablet, Take 81 mg by mouth daily., Disp: , Rfl:  .  atorvastatin (LIPITOR) 80 MG tablet, Take 1 tablet (80 mg total) by mouth daily., Disp: 90 tablet, Rfl: 1 .  budesonide-formoterol (SYMBICORT) 160-4.5 MCG/ACT inhaler, Inhale 2 puffs into the lungs 2 (two) times daily., Disp: 1 Inhaler, Rfl: 5 .  glucose blood (ONETOUCH VERIO) test strip, Use to check blood sugars daily, Disp: 100 each, Rfl: 3 .  Lancets (ONETOUCH ULTRASOFT) lancets, Use to check blood sugars daily, Disp: 100 each, Rfl: 3 .  metFORMIN (GLUCOPHAGE) 500 MG tablet, TAKE 1 TABLET (500 MG TOTAL) BY MOUTH 2 (TWO) TIMES DAILY WITH A MEAL., Disp: 180 tablet, Rfl: 0 .  Misc Natural Products (OSTEO BI-FLEX ADV TRIPLE ST PO), Take by mouth., Disp: , Rfl:  .  naproxen sodium (ALEVE) 220 MG tablet, Take 220 mg by mouth daily as needed., Disp: , Rfl:  .  omeprazole (PRILOSEC) 20 MG  capsule, Take 1 capsule (20 mg total) by mouth daily., Disp: 90 capsule, Rfl: 1 .  predniSONE (DELTASONE) 20 MG tablet, 2 po at sametime daily for 5 days, Disp: 10 tablet, Rfl: 0 .  tamsulosin (FLOMAX) 0.4 MG CAPS capsule, TAKE 1 CAPSULE (0.4 MG TOTAL) BY MOUTH DAILY AFTER SUPPER., Disp: 90 capsule, Rfl: 0  Current Facility-Administered Medications:  .  methylPREDNISolone acetate (DEPO-MEDROL) injection 80 mg, 80 mg, Other, Once, Magnus Sinning, MD   SLEEP ARCHITECTURE Patient was studied for 525.9 minutes. The sleep efficiency was 97.4 % and the patient was supine for 5.6%. The arousal index was 0.0 per hour.  RESPIRATORY PARAMETERS The overall AHI was 60.4 per hour, with a central apnea index of 1.6 per hour.  The oxygen nadir was 77% during sleep.     CARDIAC DATA Mean heart rate during sleep was 63.0 bpm.  IMPRESSIONS Severe obstructive sleep apnea occurred during this study (AHI = 60.4/h).  Formal CPAP titration study is recommended.   Delano Metz, MD Diplomate, American Board of Sleep Medicine.   ELECTRONICALLY SIGNED ON:  10/06/2018, 12:04 PM [place] PH: [phone]   FX: [phone] [place]

## 2018-10-12 ENCOUNTER — Ambulatory Visit (INDEPENDENT_AMBULATORY_CARE_PROVIDER_SITE_OTHER): Payer: Medicare Other | Admitting: *Deleted

## 2018-10-12 ENCOUNTER — Encounter: Payer: Self-pay | Admitting: *Deleted

## 2018-10-12 VITALS — BP 139/86 | HR 80 | Ht 70.0 in | Wt 212.0 lb

## 2018-10-12 DIAGNOSIS — G4733 Obstructive sleep apnea (adult) (pediatric): Secondary | ICD-10-CM

## 2018-10-12 DIAGNOSIS — Z0000 Encounter for general adult medical examination without abnormal findings: Secondary | ICD-10-CM | POA: Diagnosis not present
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
## 2018-10-12 NOTE — Patient Instructions (Addendum)
Please work on your goal of increasing walking 3 times per week for 30 minutes.   At your convenience, please bring a copy of your Advance Directives (Healthcare Power of Attorney and Living Will) to our office to be filed in your medical record.  Please follow up with [name], [name] as scheduled.  Please use your cane and move carefully to avoid falling.  Thank you for coming in for your Annual Wellness Visit today!  Preventive Care 83 Years and Older, Male Preventive care refers to lifestyle choices and visits with your health care provider that can promote health and wellness. What does preventive care include?   A yearly physical exam. This is also called an annual well check.  Dental exams once or twice a year.  Routine eye exams. Ask your health care provider how often you should have your eyes checked.  Personal lifestyle choices, including: ? Daily care of your teeth and gums. ? Regular physical activity. ? Eating a healthy diet. ? Avoiding tobacco and drug use. ? Limiting alcohol use. ? Practicing safe sex. ? Taking low doses of aspirin every day. ? Taking vitamin and mineral supplements as recommended by your health care provider. What happens during an annual well check? The services and screenings done by your health care provider during your annual well check will depend on your age, overall health, lifestyle risk factors, and family history of disease. Counseling Your health care provider may ask you questions about your:  Alcohol use.  Tobacco use.  Drug use.  Emotional well-being.  Home and relationship well-being.  Sexual activity.  Eating habits.  History of falls.  Memory and ability to understand (cognition).  Work and work Statistician. Screening You may have the following tests or measurements:  Height, weight, and BMI.  Blood pressure.  Lipid and cholesterol levels. These may be checked every 5 years, or more frequently if  you are over 58 years old.  Skin check.  Lung cancer screening. You may have this screening every year starting at age 77 if you have a 30-pack-year history of smoking and currently smoke or have quit within the past 15 years.  Colorectal cancer screening. All adults should have this screening starting at age 29 and continuing until age 46. You will have tests every 1-10 years, depending on your results and the type of screening test. People at increased risk should start screening at an earlier age. Screening tests may include: ? Guaiac-based fecal occult blood testing. ? Fecal immunochemical test (FIT). ? Stool DNA test. ? Virtual colonoscopy. ? Sigmoidoscopy. During this test, a flexible tube with a tiny camera (sigmoidoscope) is used to examine your rectum and lower colon. The sigmoidoscope is inserted through your anus into your rectum and lower colon. ? Colonoscopy. During this test, a long, thin, flexible tube with a tiny camera (colonoscope) is used to examine your entire colon and rectum.  Prostate cancer screening. Recommendations will vary depending on your family history and other risks.  Hepatitis C blood test.  Hepatitis B blood test.  Sexually transmitted disease (STD) testing.  Diabetes screening. This is done by checking your blood sugar (glucose) after you have not eaten for a while (fasting). You may have this done every 1-3 years.  Abdominal aortic aneurysm (AAA) screening. You may need this if you are a current or former smoker.  Osteoporosis. You may be screened starting at age 5 if you are at high risk. Talk with your health care provider about  your test results, treatment options, and if necessary, the need for more tests. Vaccines Your health care provider may recommend certain vaccines, such as:  Influenza vaccine. This is recommended every year.  Tetanus, diphtheria, and acellular pertussis (Tdap, Td) vaccine. You may need a Td booster every 10  years.  Varicella vaccine. You may need this if you have not been vaccinated.  Zoster vaccine. You may need this after age 50.  Measles, mumps, and rubella (MMR) vaccine. You may need at least one dose of MMR if you were born in 1957 or later. You may also need a second dose.  Pneumococcal 13-valent conjugate (PCV13) vaccine. One dose is recommended after age 100.  Pneumococcal polysaccharide (PPSV23) vaccine. One dose is recommended after age 57.  Meningococcal vaccine. You may need this if you have certain conditions.  Hepatitis A vaccine. You may need this if you have certain conditions or if you travel or work in places where you may be exposed to hepatitis A.  Hepatitis B vaccine. You may need this if you have certain conditions or if you travel or work in places where you may be exposed to hepatitis B.  Haemophilus influenzae type b (Hib) vaccine. You may need this if you have certain risk factors. Talk to your health care provider about which screenings and vaccines you need and how often you need them. This information is not intended to replace advice given to you by your health care provider. Make sure you discuss any questions you have with your health care provider. Document Released: 09/07/2015 Document Revised: 10/01/2017 Document Reviewed: 06/12/2015 Elsevier Interactive Patient Education  2019 [place].  Diabetes Mellitus and Nutrition, Adult When you have diabetes (diabetes mellitus), it is very important to have healthy eating habits because your blood sugar (glucose) levels are greatly affected by what you eat and drink. Eating healthy foods in the appropriate amounts, at about the same times every day, can help you: Control your blood glucose. Lower your risk of heart disease. Improve your blood pressure. Reach or maintain a healthy weight. Every person with diabetes is different, and each person has different needs for a meal plan. Your health care provider may  recommend that you work with a diet and nutrition specialist (dietitian) to make a meal plan that is best for you. Your meal plan may vary depending on factors such as: The calories you need. The medicines you take. Your weight. Your blood glucose, blood pressure, and cholesterol levels. Your activity level. Other health conditions you have, such as heart or kidney disease. How do carbohydrates affect me? Carbohydrates, also called carbs, affect your blood glucose level more than any other type of food. Eating carbs naturally raises the amount of glucose in your blood. Carb counting is a method for keeping track of how many carbs you eat. Counting carbs is important to keep your blood glucose at a healthy level, especially if you use insulin or take certain oral diabetes medicines. It is important to know how many carbs you can safely have in each meal. This is different for every person. Your dietitian can help you calculate how many carbs you should have at each meal and for each snack. Foods that contain carbs include: Bread, cereal, rice, pasta, and crackers. Potatoes and corn. Peas, beans, and lentils. Milk and yogurt. Fruit and juice. Desserts, such as cakes, cookies, ice cream, and candy. How does alcohol affect me? Alcohol can cause a sudden decrease in blood glucose (hypoglycemia), especially if  you use insulin or take certain oral diabetes medicines. Hypoglycemia can be a life-threatening condition. Symptoms of hypoglycemia (sleepiness, dizziness, and confusion) are similar to symptoms of having too much alcohol. If your health care provider says that alcohol is safe for you, follow these guidelines: Limit alcohol intake to no more than 1 drink per day for nonpregnant women and 2 drinks per day for men. One drink equals 12 oz of beer, 5 oz of wine, or 1 oz of hard liquor. Do not drink on an empty stomach. Keep yourself hydrated with water, diet soda, or unsweetened iced tea. Keep in  mind that regular soda, juice, and other mixers may contain a lot of sugar and must be counted as carbs. What are tips for following this plan?  Reading food labels Start by checking the serving size on the "Nutrition Facts" label of packaged foods and drinks. The amount of calories, carbs, fats, and other nutrients listed on the label is based on one serving of the item. Many items contain more than one serving per package. Check the total grams (g) of carbs in one serving. You can calculate the number of servings of carbs in one serving by dividing the total carbs by 15. For example, if a food has 30 g of total carbs, it would be equal to 2 servings of carbs. Check the number of grams (g) of saturated and trans fats in one serving. Choose foods that have low or no amount of these fats. Check the number of milligrams (mg) of salt (sodium) in one serving. Most people should limit total sodium intake to less than 2,300 mg per day. Always check the nutrition information of foods labeled as "low-fat" or "nonfat". These foods may be higher in added sugar or refined carbs and should be avoided. Talk to your dietitian to identify your daily goals for nutrients listed on the label. Shopping Avoid buying canned, premade, or processed foods. These foods tend to be high in fat, sodium, and added sugar. Shop around the outside edge of the grocery store. This includes fresh fruits and vegetables, bulk grains, fresh meats, and fresh dairy. Cooking Use low-heat cooking methods, such as baking, instead of high-heat cooking methods like deep frying. Cook using healthy oils, such as olive, canola, or sunflower oil. Avoid cooking with butter, cream, or high-fat meats. Meal planning Eat meals and snacks regularly, preferably at the same times every day. Avoid going long periods of time without eating. Eat foods high in fiber, such as fresh fruits, vegetables, beans, and whole grains. Talk to your dietitian about how  many servings of carbs you can eat at each meal. Eat 4-6 ounces (oz) of lean protein each day, such as lean meat, chicken, fish, eggs, or tofu. One oz of lean protein is equal to: 1 oz of meat, chicken, or fish. 1 egg.  cup of tofu. Eat some foods each day that contain healthy fats, such as avocado, nuts, seeds, and fish. Lifestyle Check your blood glucose regularly. Exercise regularly as told by your health care provider. This may include: 150 minutes of moderate-intensity or vigorous-intensity exercise each week. This could be brisk walking, biking, or water aerobics. Stretching and doing strength exercises, such as yoga or weightlifting, at least 2 times a week. Take medicines as told by your health care provider. Do not use any products that contain nicotine or tobacco, such as cigarettes and e-cigarettes. If you need help quitting, ask your health care provider. Work with a Social worker or  diabetes educator to identify strategies to manage stress and any emotional and social challenges. Questions to ask a health care provider Do I need to meet with a diabetes educator? Do I need to meet with a dietitian? What number can I call if I have questions? When are the best times to check my blood glucose? Where to find more information: American Diabetes Association: diabetes.org Academy of Nutrition and Dietetics: www.eatright.[employer] of Diabetes and Digestive and Kidney Diseases (NIH): [employer] Summary A healthy meal plan will help you control your blood glucose and maintain a healthy lifestyle. Working with a diet and nutrition specialist (dietitian) can help you make a meal plan that is best for you. Keep in mind that carbohydrates (carbs) and alcohol have immediate effects on your blood glucose levels. It is important to count carbs and to use alcohol carefully. This information is not intended to replace advice given to you by your health care provider. Make sure  you discuss any questions you have with your health care provider. Document Released: 05/08/2005 Document Revised: 03/11/2017 Document Reviewed: 09/15/2016 Elsevier Interactive Patient Education  2019 [employer].

## 2018-11-01 ENCOUNTER — Other Ambulatory Visit: Payer: Self-pay | Admitting: Nurse Practitioner

## 2018-11-01 DIAGNOSIS — K219 Gastro-esophageal reflux disease without esophagitis: Secondary | ICD-10-CM

## 2018-11-01 DIAGNOSIS — E785 Hyperlipidemia, unspecified: Secondary | ICD-10-CM

## 2018-11-01 DIAGNOSIS — E1142 Type 2 diabetes mellitus with diabetic polyneuropathy: Secondary | ICD-10-CM

## 2018-11-09 ENCOUNTER — Ambulatory Visit: Payer: Medicare HMO | Admitting: Cardiology

## 2018-11-16 ENCOUNTER — Institutional Professional Consult (permissible substitution): Payer: Medicare Other | Admitting: Pulmonary Disease

## 2018-11-17 ENCOUNTER — Ambulatory Visit: Payer: Medicare HMO | Admitting: Nurse Practitioner

## 2018-12-17 ENCOUNTER — Institutional Professional Consult (permissible substitution): Payer: Medicare Other | Admitting: Pulmonary Disease

## 2019-01-11 ENCOUNTER — Telehealth: Payer: Self-pay | Admitting: Cardiology

## 2019-01-11 NOTE — Telephone Encounter (Signed)
Virtual Visit Pre-Appointment Phone Call  "(Name), I am calling you today to discuss your upcoming appointment. We are currently trying to limit exposure to the virus that causes COVID-19 by seeing patients at home rather than in the office."  "What is the BEST phone number to call the day of the visit?" -  [phone]  1. Do you have or have access to (through a family member/friend) a smartphone with video capability that we can use for your visit?" a. If yes - list this number in appt notes as cell (if different from BEST phone #) and list the appointment type as a VIDEO visit in appointment notes b. If no - list the appointment type as a PHONE visit in appointment notes  2. Confirm consent - "In the setting of the current Covid19 crisis, you are scheduled for a (phone or video) visit with your provider on (date) at (time).  Just as we do with many in-office visits, in order for you to participate in this visit, we must obtain consent.  If you'd like, I can send this to your mychart (if signed up) or email for you to review.  Otherwise, I can obtain your verbal consent now.  All virtual visits are billed to your insurance company just like a normal visit would be.  By agreeing to a virtual visit, we'd like you to understand that the technology does not allow for your provider to perform an examination, and thus may limit your provider's ability to fully assess your condition. If your provider identifies any concerns that need to be evaluated in person, we will make arrangements to do so.  Finally, though the technology is pretty good, we cannot assure that it will always work on either your or our end, and in the setting of a video visit, we may have to convert it to a phone-only visit.  In either situation, we cannot ensure that we have a secure connection.  Are you willing to proceed?" STAFF: Did the patient verbally acknowledge consent to telehealth visit? Document YES/NO here:  YES  3. Advise patient to be prepared - "Two hours prior to your appointment, go ahead and check your blood pressure, pulse, oxygen saturation, and your weight (if you have the equipment to check those) and write them all down. When your visit starts, your provider will ask you for this information. If you have an Apple Watch or Kardia device, please plan to have heart rate information ready on the day of your appointment. Please have a pen and paper handy nearby the day of the visit as well."  4. Give patient instructions for MyChart download to smartphone OR Doximity/[email or web address] as below if video visit (depending on what platform provider is using)  5. Inform patient they will receive a phone call 15 minutes prior to their appointment time (may be from unknown caller ID) so they should be prepared to answer    TELEPHONE CALL NOTE  [name] has been deemed a candidate for a follow-up tele-health visit to limit community exposure during the Covid-19 pandemic. I spoke with the patient via phone to ensure availability of phone/video source, confirm preferred email & phone number, and discuss instructions and expectations.  I reminded [name] to be prepared with any vital sign and/or heart rhythm information that could potentially be obtained via home monitoring, at the time of his visit. I reminded [name] to expect a phone call prior to his visit.  Vicky  T Slaughter 01/11/2019 3:53 PM   INSTRUCTIONS FOR DOWNLOADING THE MYCHART APP TO SMARTPHONE  - The patient must first make sure to have activated MyChart and know their login information - If Apple, go to [employer] and type in MyChart in the search bar and download the app. If Android, ask patient to go to [employer] and type in [place] in the search bar and download the app. The app is free but as with any other app downloads, their phone may require them to verify saved payment information or Apple/Android password.  -  The patient will need to then log into the app with their MyChart username and password, and select  as their healthcare provider to link the account. When it is time for your visit, go to the MyChart app, find appointments, and click Begin Video Visit. Be sure to Select Allow for your device to access the Microphone and Camera for your visit. You will then be connected, and your provider will be with you shortly.  **If they have any issues connecting, or need assistance please contact MyChart service desk [phone])**  **If using a computer, in order to ensure the best quality for their visit they will need to use either of the following Internet Browsers: [employer], or Google Chrome**  IF USING DOXIMITY or DOXY.ME - The patient will receive a link just prior to their visit by text.     FULL LENGTH CONSENT FOR TELE-HEALTH VISIT   I hereby voluntarily request, consent and authorize [facility] and its employed or contracted physicians, physician assistants, nurse practitioners or other licensed health care professionals (the Practitioner), to provide me with telemedicine health care services (the Services") as deemed necessary by the treating Practitioner. I acknowledge and consent to receive the Services by the Practitioner via telemedicine. I understand that the telemedicine visit will involve communicating with the Practitioner through live audiovisual communication technology and the disclosure of certain medical information by electronic transmission. I acknowledge that I have been given the opportunity to request an in-person assessment or other available alternative prior to the telemedicine visit and am voluntarily participating in the telemedicine visit.  I understand that I have the right to withhold or withdraw my consent to the use of telemedicine in the course of my care at any time, without affecting my right to future care or treatment, and that the  Practitioner or I may terminate the telemedicine visit at any time. I understand that I have the right to inspect all information obtained and/or recorded in the course of the telemedicine visit and may receive copies of available information for a reasonable fee.  I understand that some of the potential risks of receiving the Services via telemedicine include:   Delay or interruption in medical evaluation due to technological equipment failure or disruption;  Information transmitted may not be sufficient (e.g. poor resolution of images) to allow for appropriate medical decision making by the Practitioner; and/or   In rare instances, security protocols could fail, causing a breach of personal health information.  Furthermore, I acknowledge that it is my responsibility to provide information about my medical history, conditions and care that is complete and accurate to the best of my ability. I acknowledge that Practitioner's advice, recommendations, and/or decision may be based on factors not within their control, such as incomplete or inaccurate data provided by me or distortions of diagnostic images or specimens that may result from electronic transmissions. I understand that the practice  of medicine is not an Chief Strategy Officer and that Practitioner makes no warranties or guarantees regarding treatment outcomes. I acknowledge that I will receive a copy of this consent concurrently upon execution via email to the email address I last provided but may also request a printed copy by calling the office of [facility].    I understand that my insurance will be billed for this visit.   I have read or had this consent read to me.  I understand the contents of this consent, which adequately explains the benefits and risks of the Services being provided via telemedicine.   I have been provided ample opportunity to ask questions regarding this consent and the Services and have had my questions answered to my  satisfaction.  I give my informed consent for the services to be provided through the use of telemedicine in my medical care  By participating in this telemedicine visit I agree to the above.

## 2019-01-13 ENCOUNTER — Encounter (INDEPENDENT_AMBULATORY_CARE_PROVIDER_SITE_OTHER): Payer: Medicare Other

## 2019-01-13 ENCOUNTER — Encounter: Payer: Self-pay | Admitting: Cardiology

## 2019-01-13 ENCOUNTER — Telehealth (INDEPENDENT_AMBULATORY_CARE_PROVIDER_SITE_OTHER): Payer: Medicare Other | Admitting: Cardiology

## 2019-01-13 VITALS — BP 162/81 | HR 62 | Ht 71.0 in | Wt 207.0 lb

## 2019-01-13 DIAGNOSIS — R001 Bradycardia, unspecified: Secondary | ICD-10-CM | POA: Diagnosis not present

## 2019-01-13 DIAGNOSIS — I2510 Atherosclerotic heart disease of native coronary artery without angina pectoris: Secondary | ICD-10-CM

## 2019-01-13 DIAGNOSIS — R0602 Shortness of breath: Secondary | ICD-10-CM | POA: Diagnosis not present

## 2019-01-13 DIAGNOSIS — I10 Essential (primary) hypertension: Secondary | ICD-10-CM
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
## 2019-01-13 NOTE — Patient Instructions (Signed)
Your physician recommends that you schedule a follow-up appointment in: [place]   Your physician recommends that you continue on your current medications as directed. Please refer to the Current Medication list given to you today.  Your physician has recommended that you wear a holter monitor 48 HOURS. Holter monitors are medical devices that record the heart's electrical activity. Doctors most often use these monitors to diagnose arrhythmias. Arrhythmias are problems with the speed or rhythm of the heartbeat. The monitor is a small, portable device. You can wear one while you do your normal daily activities. This is usually used to diagnose what is causing palpitations/syncope (passing out).  Thank you for choosing [facility]!!

## 2019-01-14 ENCOUNTER — Ambulatory Visit (INDEPENDENT_AMBULATORY_CARE_PROVIDER_SITE_OTHER): Payer: Medicare Other | Admitting: Internal Medicine

## 2019-01-14 ENCOUNTER — Other Ambulatory Visit: Payer: Self-pay

## 2019-01-14 ENCOUNTER — Encounter: Payer: Self-pay | Admitting: Internal Medicine

## 2019-01-14 VITALS — BP 158/62 | HR 80 | Temp 97.5°F | Ht 72.0 in | Wt 209.8 lb

## 2019-01-14 DIAGNOSIS — J449 Chronic obstructive pulmonary disease, unspecified: Secondary | ICD-10-CM | POA: Diagnosis not present

## 2019-01-14 DIAGNOSIS — G4733 Obstructive sleep apnea (adult) (pediatric): Secondary | ICD-10-CM | POA: Diagnosis not present

## 2019-01-14 NOTE — Patient Instructions (Signed)
Order- new DME, new CPAP auto 5-20, mask of choice, humidifier, supplies, AirView/ card  Continue with your current breathing medicines. We will look for ways to help your breathing as we see you again.
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
## 2019-01-16 ENCOUNTER — Other Ambulatory Visit: Payer: Self-pay | Admitting: Nurse Practitioner

## 2019-01-16 DIAGNOSIS — N3946 Mixed incontinence: Secondary | ICD-10-CM

## 2019-01-17 NOTE — Assessment & Plan Note (Addendum)
Severe OSA  As of recent HST. He has some interest in [place]. Not sure if he would be excluded by age. We do need first to be sure he has had appropriate trial of CPAP with comfort adjustments. Plan- establish DME and auto pap. Reassess and consider future options based on this experience.

## 2019-01-17 NOTE — Assessment & Plan Note (Signed)
Currently stable without exacerbation. On f/u we will see if he needs our in-put on this issue

## 2019-01-20 DIAGNOSIS — R001 Bradycardia, unspecified: Secondary | ICD-10-CM | POA: Diagnosis not present

## 2019-01-21 ENCOUNTER — Institutional Professional Consult (permissible substitution): Payer: Medicare Other | Admitting: Internal Medicine

## 2019-01-26 ENCOUNTER — Encounter: Payer: Self-pay | Admitting: Student

## 2019-01-26 ENCOUNTER — Other Ambulatory Visit: Payer: Self-pay

## 2019-01-26 ENCOUNTER — Encounter: Payer: Medicare Other | Admitting: Student

## 2019-01-26 ENCOUNTER — Telehealth: Payer: Self-pay | Admitting: Student

## 2019-01-26 NOTE — Progress Notes (Signed)
    Patient was scheduled for a follow-up telehealth visit today to review monitor results but the patient's daughter says he just returned it yesterday and that is why it has not been resulted. She is concerned with him doing a phone visit as he is very hard of hearing. She wishes to reschedule and have an in-office visit with Dr. Harl Bowie once his monitor has resulted. Will see if this can be arranged within the next few weeks. If nothing available with Dr. Harl Bowie, could also be with APP in [place].   Signed, Erma Heritage, PA-C 01/26/2019, 2:29 PM Pager: [phone]

## 2019-01-26 NOTE — Telephone Encounter (Signed)
    Patient was scheduled for a follow-up telehealth visit today to review monitor results but the patient's daughter says he just returned it yesterday and that is why it has not been resulted. She is concerned with him doing a phone visit as he is very hard of hearing. She wishes to reschedule and have an in-office visit with Dr. Harl Bowie once his monitor has resulted. Will see if this can be arranged within the next few weeks. If nothing available with Dr. Harl Bowie, could also be with APP in [place].   Signed, Erma Heritage, PA-C 01/26/2019, 2:35 PM Pager: [phone]

## 2019-02-09 ENCOUNTER — Telehealth: Payer: Self-pay | Admitting: Cardiology

## 2019-02-09 NOTE — Telephone Encounter (Signed)

## 2019-02-10 ENCOUNTER — Encounter: Payer: Self-pay | Admitting: Cardiology

## 2019-02-10 ENCOUNTER — Ambulatory Visit (INDEPENDENT_AMBULATORY_CARE_PROVIDER_SITE_OTHER): Payer: Medicare Other | Admitting: Cardiology

## 2019-02-10 ENCOUNTER — Other Ambulatory Visit: Payer: Self-pay

## 2019-02-10 VITALS — BP 188/79 | HR 61 | Temp 97.3°F | Ht 71.0 in | Wt 211.2 lb

## 2019-02-10 DIAGNOSIS — R001 Bradycardia, unspecified: Secondary | ICD-10-CM | POA: Diagnosis not present

## 2019-02-10 DIAGNOSIS — I10 Essential (primary) hypertension: Secondary | ICD-10-CM

## 2019-02-10 DIAGNOSIS — I2510 Atherosclerotic heart disease of native coronary artery without angina pectoris: Secondary | ICD-10-CM

## 2019-02-10 DIAGNOSIS — I4430 Unspecified atrioventricular block: Secondary | ICD-10-CM

## 2019-02-10 MED ORDER — AMLODIPINE BESYLATE 2.5 MG PO TABS: 2.5000 mg | ORAL_TABLET | Freq: Every day | ORAL | 1 refills | Status: DC

## 2019-02-10 NOTE — Patient Instructions (Signed)
Your physician recommends that you schedule a follow-up appointment in: [place] has recommended you make the following change in your medication:   START AMLODIPINE 2.5 MG DAILY   You have been referred to DR ALLRED ELECTROPHYSIOLOGY   Thank you for choosing [facility]!!
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
## 2019-02-11 ENCOUNTER — Telehealth: Payer: Self-pay

## 2019-02-11 NOTE — Telephone Encounter (Signed)
Left message regarding appt on 02/14/19.

## 2019-02-12 ENCOUNTER — Other Ambulatory Visit: Payer: Self-pay | Admitting: Nurse Practitioner

## 2019-02-12 DIAGNOSIS — N3946 Mixed incontinence: Secondary | ICD-10-CM

## 2019-02-14 ENCOUNTER — Encounter: Payer: Self-pay | Admitting: Internal Medicine

## 2019-02-14 ENCOUNTER — Other Ambulatory Visit: Payer: Self-pay

## 2019-02-14 ENCOUNTER — Telehealth (INDEPENDENT_AMBULATORY_CARE_PROVIDER_SITE_OTHER): Payer: Medicare Other | Admitting: Internal Medicine

## 2019-02-14 VITALS — BP 166/83 | HR 66 | Ht 71.0 in | Wt 208.0 lb

## 2019-02-14 DIAGNOSIS — I4430 Unspecified atrioventricular block: Secondary | ICD-10-CM | POA: Diagnosis not present

## 2019-02-14 DIAGNOSIS — I10 Essential (primary) hypertension: Secondary | ICD-10-CM | POA: Diagnosis not present

## 2019-02-14 DIAGNOSIS — I2510 Atherosclerotic heart disease of native coronary artery without angina pectoris: Secondary | ICD-10-CM | POA: Diagnosis not present

## 2019-02-14 DIAGNOSIS — R0602 Shortness of breath: Secondary | ICD-10-CM | POA: Diagnosis not present

## 2019-02-14 DIAGNOSIS — R001 Bradycardia, unspecified: Secondary | ICD-10-CM
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
## 2019-02-14 NOTE — Telephone Encounter (Signed)
NOV 02/20/29
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
## 2019-02-15 ENCOUNTER — Telehealth: Payer: Self-pay

## 2019-02-15 DIAGNOSIS — I4430 Unspecified atrioventricular block: Secondary | ICD-10-CM

## 2019-02-15 NOTE — Telephone Encounter (Signed)
Spoke with daughter Cheron Every Pt PPM implant will either be June 30 or July 2.    Will confirm on Thursday.  Daughter indicates understanding.

## 2019-02-17 NOTE — Telephone Encounter (Signed)
Call placed to daughter.  Pt scheduled for PPM placement on June 30 at 1:30 pm with Dr. [name]  Pt/assist will come for labs/instruction letters/soap at [address] office 6/26  covid test 6/26.  Work up complete

## 2019-02-18 ENCOUNTER — Other Ambulatory Visit: Payer: Self-pay

## 2019-02-18 ENCOUNTER — Other Ambulatory Visit (HOSPITAL_COMMUNITY)
Admission: RE | Admit: 2019-02-18 | Discharge: 2019-02-18 | Disposition: A | Discharge status: Home / Self Care | Payer: Medicare Other | Source: Ambulatory Visit | Attending: Internal Medicine | Admitting: Internal Medicine

## 2019-02-18 ENCOUNTER — Other Ambulatory Visit: Payer: Medicare Other | Admitting: *Deleted

## 2019-02-18 DIAGNOSIS — I4430 Unspecified atrioventricular block: Secondary | ICD-10-CM | POA: Diagnosis not present

## 2019-02-18 DIAGNOSIS — Z1159 Encounter for screening for other viral diseases: Secondary | ICD-10-CM | POA: Insufficient documentation

## 2019-02-18 LAB — BASIC METABOLIC PANEL
BUN/Creatinine Ratio: 14 (ref 10–24)
BUN: 14 mg/dL (ref 8–27)
CO2: 25 mmol/L (ref 20–29)
Calcium: 9.6 mg/dL (ref 8.6–10.2)
Chloride: 101 mmol/L (ref 96–106)
Creatinine, Ser: 0.97 mg/dL (ref 0.76–1.27)
GFR calc Af Amer: 83 mL/min/{1.73_m2} (ref >59)
GFR calc non Af Amer: 72 mL/min/{1.73_m2} (ref >59)
Glucose: 183 mg/dL — ABNORMAL HIGH (ref 65–99)
Potassium: 4.2 mmol/L (ref 3.5–5.2)
Sodium: 141 mmol/L (ref 134–144)

## 2019-02-18 LAB — SARS CORONAVIRUS 2 (TAT 6-24 HRS): SARS Coronavirus 2: NEGATIVE

## 2019-02-19 LAB — CBC WITH DIFFERENTIAL/PLATELET
Basophils Absolute: 0 10*3/uL (ref 0.0–0.2)
Basos: 0 %
EOS (ABSOLUTE): 0.2 10*3/uL (ref 0.0–0.4)
Eos: 4 %
Hematocrit: 40.3 % (ref 37.5–51.0)
Hemoglobin: 13.4 g/dL (ref 13.0–17.7)
Immature Grans (Abs): 0 10*3/uL (ref 0.0–0.1)
Immature Granulocytes: 1 %
Lymphocytes Absolute: 1.1 10*3/uL (ref 0.7–3.1)
Lymphs: 19 %
MCH: 31.5 pg (ref 26.6–33.0)
MCHC: 33.3 g/dL (ref 31.5–35.7)
MCV: 95 fL (ref 79–97)
Monocytes Absolute: 0.6 10*3/uL (ref 0.1–0.9)
Monocytes: 10 %
Neutrophils Absolute: 3.9 10*3/uL (ref 1.4–7.0)
Neutrophils: 66 %
Platelets: 97 10*3/uL — CL (ref 150–450)
RBC: 4.26 x10E6/uL (ref 4.14–5.80)
RDW: 15.6 % — ABNORMAL HIGH (ref 11.6–15.4)
WBC: 5.8 10*3/uL (ref 3.4–10.8)

## 2019-02-21 ENCOUNTER — Ambulatory Visit: Payer: Medicare Other | Admitting: Nurse Practitioner

## 2019-02-21 ENCOUNTER — Telehealth: Payer: Self-pay

## 2019-02-21 NOTE — Telephone Encounter (Signed)
Spoke with wife.  Advised to have Pt arrive for his procedure tomorrow 02/22/2019 at 9;30 am instead of 11:30 am.  Wife indicates understanding.

## 2019-02-22 ENCOUNTER — Ambulatory Visit (HOSPITAL_COMMUNITY)
Admission: RE | Admit: 2019-02-22 | Discharge: 2019-02-23 | Disposition: A | Discharge status: Home / Self Care | Payer: Medicare Other | Attending: Internal Medicine | Admitting: Internal Medicine

## 2019-02-22 ENCOUNTER — Encounter (HOSPITAL_COMMUNITY): Payer: Self-pay | Admitting: General Practice

## 2019-02-22 ENCOUNTER — Other Ambulatory Visit: Payer: Self-pay

## 2019-02-22 ENCOUNTER — Ambulatory Visit (HOSPITAL_COMMUNITY)
Admission: RE | Disposition: A | Discharge status: Home / Self Care | Payer: Self-pay | Source: Home / Self Care | Attending: Internal Medicine

## 2019-02-22 DIAGNOSIS — Z87891 Personal history of nicotine dependence: Secondary | ICD-10-CM | POA: Diagnosis not present

## 2019-02-22 DIAGNOSIS — Z87828 Personal history of other (healed) physical injury and trauma: Secondary | ICD-10-CM | POA: Insufficient documentation

## 2019-02-22 DIAGNOSIS — Z885 Allergy status to narcotic agent status: Secondary | ICD-10-CM | POA: Insufficient documentation

## 2019-02-22 DIAGNOSIS — M1990 Unspecified osteoarthritis, unspecified site: Secondary | ICD-10-CM | POA: Diagnosis not present

## 2019-02-22 DIAGNOSIS — G4733 Obstructive sleep apnea (adult) (pediatric): Secondary | ICD-10-CM | POA: Diagnosis not present

## 2019-02-22 DIAGNOSIS — J449 Chronic obstructive pulmonary disease, unspecified: Secondary | ICD-10-CM | POA: Diagnosis not present

## 2019-02-22 DIAGNOSIS — I2510 Atherosclerotic heart disease of native coronary artery without angina pectoris: Secondary | ICD-10-CM | POA: Insufficient documentation

## 2019-02-22 DIAGNOSIS — Z8669 Personal history of other diseases of the nervous system and sense organs: Secondary | ICD-10-CM | POA: Insufficient documentation

## 2019-02-22 DIAGNOSIS — Z7951 Long term (current) use of inhaled steroids: Secondary | ICD-10-CM | POA: Insufficient documentation

## 2019-02-22 DIAGNOSIS — I441 Atrioventricular block, second degree: Secondary | ICD-10-CM | POA: Diagnosis present

## 2019-02-22 DIAGNOSIS — I442 Atrioventricular block, complete: Secondary | ICD-10-CM | POA: Diagnosis not present

## 2019-02-22 DIAGNOSIS — Z79899 Other long term (current) drug therapy: Secondary | ICD-10-CM | POA: Diagnosis not present

## 2019-02-22 DIAGNOSIS — Z959 Presence of cardiac and vascular implant and graft, unspecified: Secondary | ICD-10-CM

## 2019-02-22 DIAGNOSIS — Z951 Presence of aortocoronary bypass graft: Secondary | ICD-10-CM | POA: Diagnosis not present

## 2019-02-22 DIAGNOSIS — K219 Gastro-esophageal reflux disease without esophagitis: Secondary | ICD-10-CM | POA: Diagnosis not present

## 2019-02-22 DIAGNOSIS — Z8249 Family history of ischemic heart disease and other diseases of the circulatory system: Secondary | ICD-10-CM | POA: Insufficient documentation

## 2019-02-22 DIAGNOSIS — I10 Essential (primary) hypertension: Secondary | ICD-10-CM | POA: Diagnosis not present

## 2019-02-22 DIAGNOSIS — Z888 Allergy status to other drugs, medicaments and biological substances status: Secondary | ICD-10-CM | POA: Diagnosis not present

## 2019-02-22 DIAGNOSIS — E785 Hyperlipidemia, unspecified: Secondary | ICD-10-CM | POA: Diagnosis not present

## 2019-02-22 DIAGNOSIS — Z7982 Long term (current) use of aspirin: Secondary | ICD-10-CM | POA: Insufficient documentation

## 2019-02-22 DIAGNOSIS — Z8673 Personal history of transient ischemic attack (TIA), and cerebral infarction without residual deficits: Secondary | ICD-10-CM | POA: Insufficient documentation

## 2019-02-22 DIAGNOSIS — E119 Type 2 diabetes mellitus without complications: Secondary | ICD-10-CM | POA: Insufficient documentation

## 2019-02-22 DIAGNOSIS — Z7984 Long term (current) use of oral hypoglycemic drugs: Secondary | ICD-10-CM | POA: Diagnosis not present

## 2019-02-22 HISTORY — PX: INSERT / REPLACE / REMOVE PACEMAKER: SUR710

## 2019-02-22 HISTORY — PX: PACEMAKER IMPLANT: EP1218

## 2019-02-22 LAB — GLUCOSE, CAPILLARY
Glucose-Capillary: 108 mg/dL — ABNORMAL HIGH (ref 70–99)
Glucose-Capillary: 150 mg/dL — ABNORMAL HIGH (ref 70–99)
Glucose-Capillary: 120 mg/dL — ABNORMAL HIGH (ref 70–99)

## 2019-02-22 LAB — SURGICAL PCR SCREEN
MRSA, PCR: NEGATIVE
Staphylococcus aureus: NEGATIVE

## 2019-02-22 SURGERY — PACEMAKER IMPLANT

## 2019-02-22 MED ORDER — LIDOCAINE HCL 1 % IJ SOLN
INTRAMUSCULAR | Status: AC
  Filled 2019-02-22: qty 60

## 2019-02-22 MED ORDER — INSULIN ASPART 100 UNIT/ML ~~LOC~~ SOLN
0.0000 [IU] | Freq: Three times a day (TID) | SUBCUTANEOUS | Status: DC
  Administered 2019-02-22: 1 [IU] via SUBCUTANEOUS

## 2019-02-22 MED ORDER — CEFAZOLIN SODIUM-DEXTROSE 2-4 GM/100ML-% IV SOLN
INTRAVENOUS | Status: AC
  Filled 2019-02-22: qty 100

## 2019-02-22 MED ORDER — ALBUTEROL SULFATE (2.5 MG/3ML) 0.083% IN NEBU: 3.0000 mL | INHALATION_SOLUTION | Freq: Four times a day (QID) | RESPIRATORY_TRACT | Status: DC | PRN

## 2019-02-22 MED ORDER — ZOLPIDEM TARTRATE 5 MG PO TABS
5.0000 mg | ORAL_TABLET | Freq: Once | ORAL | Status: AC
  Administered 2019-02-22: 5 mg via ORAL
  Filled 2019-02-22: qty 1

## 2019-02-22 MED ORDER — HEPARIN (PORCINE) IN NACL 1000-0.9 UT/500ML-% IV SOLN
INTRAVENOUS | Status: AC
  Filled 2019-02-22: qty 500

## 2019-02-22 MED ORDER — SODIUM CHLORIDE 0.9 % IV SOLN
250.0000 mL | INTRAVENOUS | Status: DC | PRN
  Administered 2019-02-22 – 2019-02-23 (×2): 250 mL via INTRAVENOUS

## 2019-02-22 MED ORDER — LIDOCAINE HCL (PF) 1 % IJ SOLN
INTRAMUSCULAR | Status: DC | PRN
  Administered 2019-02-22: 45 mL

## 2019-02-22 MED ORDER — SODIUM CHLORIDE 0.9% FLUSH: 3.0000 mL | Freq: Two times a day (BID) | INTRAVENOUS | Status: DC

## 2019-02-22 MED ORDER — CEFAZOLIN SODIUM-DEXTROSE 2-4 GM/100ML-% IV SOLN
2.0000 g | INTRAVENOUS | Status: AC
  Administered 2019-02-22: 11:00:00 2 g via INTRAVENOUS

## 2019-02-22 MED ORDER — SODIUM CHLORIDE 0.9 % IV SOLN
80.0000 mg | INTRAVENOUS | Status: AC
  Administered 2019-02-22: 80 mg
  Filled 2019-02-22: qty 2

## 2019-02-22 MED ORDER — AMLODIPINE BESYLATE 2.5 MG PO TABS
2.5000 mg | ORAL_TABLET | Freq: Every day | ORAL | Status: DC
  Administered 2019-02-22 – 2019-02-23 (×2): 2.5 mg via ORAL
  Filled 2019-02-22 (×2): qty 1

## 2019-02-22 MED ORDER — SODIUM CHLORIDE 0.9 % IV SOLN
INTRAVENOUS | Status: DC
  Administered 2019-02-22: 10:00:00 via INTRAVENOUS

## 2019-02-22 MED ORDER — SODIUM CHLORIDE 0.9 % IV SOLN
INTRAVENOUS | Status: AC
  Filled 2019-02-22: qty 2

## 2019-02-22 MED ORDER — SODIUM CHLORIDE 0.9% FLUSH: 3.0000 mL | INTRAVENOUS | Status: DC | PRN

## 2019-02-22 MED ORDER — CHLORHEXIDINE GLUCONATE 4 % EX LIQD
60.0000 mL | Freq: Once | CUTANEOUS | Status: DC
  Filled 2019-02-22: qty 60

## 2019-02-22 MED ORDER — IOHEXOL 350 MG/ML SOLN
INTRAVENOUS | Status: DC | PRN
  Administered 2019-02-22: 15 mL via INTRAVENOUS

## 2019-02-22 MED ORDER — ONDANSETRON HCL 4 MG/2ML IJ SOLN
4.0000 mg | Freq: Four times a day (QID) | INTRAMUSCULAR | Status: DC | PRN
  Administered 2019-02-23: 4 mg via INTRAVENOUS
  Filled 2019-02-22: qty 2

## 2019-02-22 MED ORDER — HEPARIN (PORCINE) IN NACL 1000-0.9 UT/500ML-% IV SOLN
INTRAVENOUS | Status: DC | PRN
  Administered 2019-02-22: 500 mL

## 2019-02-22 MED ORDER — CEFAZOLIN SODIUM-DEXTROSE 1-4 GM/50ML-% IV SOLN
1.0000 g | Freq: Four times a day (QID) | INTRAVENOUS | Status: AC
  Administered 2019-02-22 – 2019-02-23 (×3): 1 g via INTRAVENOUS
  Filled 2019-02-22 (×3): qty 50

## 2019-02-22 MED ORDER — TAMSULOSIN HCL 0.4 MG PO CAPS
0.4000 mg | ORAL_CAPSULE | Freq: Every day | ORAL | Status: DC
  Administered 2019-02-22: 0.4 mg via ORAL
  Filled 2019-02-22: qty 1

## 2019-02-22 MED ORDER — MUPIROCIN 2 % EX OINT
TOPICAL_OINTMENT | CUTANEOUS | Status: AC
  Administered 2019-02-22: 1 via NASAL
  Filled 2019-02-22: qty 22

## 2019-02-22 MED ORDER — ACETAMINOPHEN 325 MG PO TABS
325.0000 mg | ORAL_TABLET | ORAL | Status: DC | PRN
  Administered 2019-02-22: 650 mg via ORAL
  Filled 2019-02-22: qty 2

## 2019-02-22 SURGICAL SUPPLY — 7 items
CABLE SURGICAL S-101-97-12 (CABLE) ×2 IMPLANT
LEAD TENDRIL MRI 52CM LPA1200M (Lead) ×1 IMPLANT
LEAD TENDRIL MRI 58CM LPA1200M (Lead) ×1 IMPLANT
PACEMAKER ASSURITY DR-RF (Pacemaker) ×1 IMPLANT
PAD PRO RADIOLUCENT 2001M-C (PAD) ×2 IMPLANT
SHEATH CLASSIC 8F (SHEATH) ×2 IMPLANT
TRAY PACEMAKER INSERTION (PACKS) ×2 IMPLANT

## 2019-02-22 NOTE — Discharge Summary (Signed)
ELECTROPHYSIOLOGY PROCEDURE DISCHARGE SUMMARY    Patient ID: [name],  MRN: [id number], DOB/AGE: Dec 26, 1932 85 y.o.  Admit date: 02/22/2019 Discharge date: 02/23/2019  Primary Care Physician: [name], [place]  Primary Cardiologist: Dr. Harl Bowie Electrophysiologist: Dr. [name]  Primary Discharge Diagnosis:  1. Symptomatic bradycardia status post pacemaker implantation this admission  Secondary Discharge Diagnosis:  1. HTN 2. CAD (h/o CABG) 3. DM 4. COPD 5. Old CVA   Allergies  Allergen Reactions   Tramadol Other (See Comments)    Dizzy   Ketorolac Tromethamine Rash     Procedures This Admission:  1.  Implantation of a SJM dual  chamber PPM on 02/22/2019 by Dr [name].  The patient received a [place] MRI model [id number] (serial number  [id number]) pacemaker, [place] MRI model [id number]- 52 (serial number  [id number]) right atrial lead and a [employer] Tendril MRI model [id number]  (serial number  [id number]) right ventricular lead There were no immediate post procedure complications. 2.  CXR on 02/23/2019 demonstrated no pneumothorax status post device implantation, reviewed by Dr. [name] as well with stable lead positioning  Brief HPI: [name] is a 85 y.o. male was referred to electrophysiology in the outpatient setting for consideration of PPM implantation.  Past medical history includes above.  The patient has had symptomatic bradycardia without reversible causes identified.  Risks, benefits, and alternatives to PPM implantation were reviewed with the patient who wished to proceed.   Hospital Course:  The patient was admitted and underwent implantation of a PPM with details as outlined above. She was monitored on telemetry overnight which demonstrated SR, V paced rhythm, occ PVCs.  Left chest has a small to mod area of ecchymosis towards axilla, is soft, non-tender, there is no hematoma.  The device was interrogated and  found to be functioning normally.  CXR was obtained and demonstrated no pneumothorax status post device implantation.  Wound care, arm mobility, and restrictions were reviewed with the patient at length and in detail, as well as written instructions provided in his AVS.  The patient feels well this morning,  No CP or SOB, is eager to go home.   Dr. [name] notes stable for discharge to home.    Physical Exam: Vitals:   02/23/19 0347 02/23/19 0550 02/23/19 0815 02/23/19 0914  BP: (!) 146/85  (!) 148/82 (!) 153/76  Pulse: 81  94 92  Resp:   19   Temp: 98.3 F (36.8 C)  98.7 F (37.1 C)   TempSrc: Oral  Oral   SpO2: 93%  95%   Weight:  91.1 kg    Height:        GEN- The patient is well appearing, alert and oriented x 3 today.   HEENT: normocephalic, atraumatic; sclera clear, conjunctiva pink; hearing intact; oropharynx clear; neck supple, no JVP Lungs- CTA b/l, normal work of breathing.  No wheezes, rales, rhonchi Heart- RRR, no murmurs, rubs or gallops, PMI not laterally displaced GI- soft, non-tender, non-distended Extremities- no clubbing, cyanosis, or edema MS- no significant deformity or atrophy Skin- warm and dry, no rash or lesion, left chest without hematoma, + ecchymosis, laterally towards axilla, is soft, non-tender Psych- euthymic mood, full affect Neuro- no gross deficits   Labs:   Lab Results  Component Value Date   WBC 5.8 02/18/2019   HGB 13.4 02/18/2019   HCT 40.3 02/18/2019   MCV 95 02/18/2019   PLT 97 (LL) 02/18/2019  Recent Labs  Lab 02/23/19 0240  NA 139  K 3.9  CL 106  CO2 25  BUN 16  CREATININE 0.86  CALCIUM 9.4  GLUCOSE 140*    Discharge Medications:  Allergies as of 02/23/2019      Reactions   Tramadol Other (See Comments)   Dizzy   Ketorolac Tromethamine Rash      Medication List    TAKE these medications   albuterol 108 (90 Base) MCG/ACT inhaler Commonly known as: VENTOLIN HFA Inhale 2 puffs into the lungs every 6 (six) hours as  needed for wheezing or shortness of breath.   amLODipine 2.5 MG tablet Commonly known as: NORVASC Take 1 tablet (2.5 mg total) by mouth daily.   aspirin EC 81 MG tablet Take 81 mg by mouth daily.   atorvastatin 80 MG tablet Commonly known as: LIPITOR TAKE 1 TABLET BY MOUTH EVERY DAY   budesonide-formoterol 160-4.5 MCG/ACT inhaler Commonly known as: SYMBICORT Inhale 2 puffs into the lungs 2 (two) times daily.   glucose blood test strip Commonly known as: OneTouch Verio Use to check blood sugars daily   metFORMIN 500 MG tablet Commonly known as: GLUCOPHAGE TAKE 1 TABLET (500 MG TOTAL) BY MOUTH 2 (TWO) TIMES DAILY WITH A MEAL. What changed: See the new instructions.   naproxen sodium 220 MG tablet Commonly known as: ALEVE Take 220 mg by mouth daily as needed.   omeprazole 20 MG capsule Commonly known as: PRILOSEC TAKE 1 CAPSULE BY MOUTH EVERY DAY   onetouch ultrasoft lancets Use to check blood sugars daily   OSTEO BI-FLEX ADV TRIPLE ST PO Take by mouth.   tamsulosin 0.4 MG Caps capsule Commonly known as: FLOMAX TAKE 1 CAPSULE (0.4 MG TOTAL) BY MOUTH DAILY AFTER SUPPER. NEEDS TO BE SEEN FOR FUTURE REFILLS       Disposition:  Home  Discharge Instructions    Diet - low sodium heart healthy   Complete by: As directed    Increase activity slowly   Complete by: As directed      Follow-up Information    [facility] Follow up.   Specialty: Cardiology Why: 03/08/2019 @ 10:30AM, wound check visit Contact information: [address], [place]       Thompson Grayer, MD Follow up.   Specialty: Cardiology Why: 05/27/2019 @ 10:30AM Contact information: [place] [address] [phone]           Duration of Discharge Encounter: Greater than 30 minutes including physician time.  Venetia Night, PA-C 02/23/2019 9:24 AM

## 2019-02-22 NOTE — Discharge Instructions (Signed)
° ° °  Supplemental Discharge Instructions for  Pacemaker/Defibrillator Patients  Activity No heavy lifting or vigorous activity with your left/right arm for 6 to 8 weeks.  Do not raise your left/right arm above your head for one week.  Gradually raise your affected arm as drawn below.              02/26/2019                  02/27/2019                  02/28/2019                 03/01/2019 __  NO DRIVING for  1 week  ; you may begin driving on  6/0/7371   .  WOUND CARE - Keep the wound area clean and dry.  Do not get this area wet, no showers until cleared to  At your wound check visit. - The tape/steri-strips on your wound will fall off; do not pull them off.  No bandage is needed on the site.  DO  NOT apply any creams, oils, or ointments to the wound area. - If you notice any drainage or discharge from the wound, any swelling or bruising at the site, or you develop a fever > 101? F after you are discharged home, call the office at once.  Special Instructions - You are still able to use cellular telephones; use the ear opposite the side where you have your pacemaker/defibrillator.  Avoid carrying your cellular phone near your device. - When traveling through airports, show security personnel your identification card to avoid being screened in the metal detectors.  Ask the security personnel to use the hand wand. - Avoid arc welding equipment, MRI testing (magnetic resonance imaging), TENS units (transcutaneous nerve stimulators).  Call the office for questions about other devices. - Avoid electrical appliances that are in poor condition or are not properly grounded. - Microwave ovens are safe to be near or to operate.

## 2019-02-22 NOTE — Interval H&P Note (Signed)
History and Physical Interval Note:  02/22/2019 9:38 AM  [name]  has presented today for surgery, with the diagnosis of bradycardia.  The various methods of treatment have been discussed with the patient and family. After consideration of risks, benefits and other options for treatment, the patient has consented to  Procedure(s): PACEMAKER IMPLANT (N/A) as a surgical intervention.  The patient's history has been reviewed, patient examined, no change in status, stable for surgery.  I have reviewed the patient's chart and labs.  Questions were answered to the patient's satisfaction.    Thompson Grayer MD, [facility] 02/22/2019 9:38 AM

## 2019-02-23 ENCOUNTER — Ambulatory Visit (HOSPITAL_COMMUNITY): Payer: Medicare Other

## 2019-02-23 DIAGNOSIS — Z885 Allergy status to narcotic agent status: Secondary | ICD-10-CM | POA: Diagnosis not present

## 2019-02-23 DIAGNOSIS — Z8673 Personal history of transient ischemic attack (TIA), and cerebral infarction without residual deficits: Secondary | ICD-10-CM | POA: Diagnosis not present

## 2019-02-23 DIAGNOSIS — Z8669 Personal history of other diseases of the nervous system and sense organs: Secondary | ICD-10-CM | POA: Diagnosis not present

## 2019-02-23 DIAGNOSIS — Z87891 Personal history of nicotine dependence: Secondary | ICD-10-CM | POA: Diagnosis not present

## 2019-02-23 DIAGNOSIS — J449 Chronic obstructive pulmonary disease, unspecified: Secondary | ICD-10-CM | POA: Diagnosis not present

## 2019-02-23 DIAGNOSIS — M1990 Unspecified osteoarthritis, unspecified site: Secondary | ICD-10-CM | POA: Diagnosis not present

## 2019-02-23 DIAGNOSIS — R001 Bradycardia, unspecified: Secondary | ICD-10-CM | POA: Diagnosis not present

## 2019-02-23 DIAGNOSIS — Z79899 Other long term (current) drug therapy: Secondary | ICD-10-CM | POA: Diagnosis not present

## 2019-02-23 DIAGNOSIS — Z7984 Long term (current) use of oral hypoglycemic drugs: Secondary | ICD-10-CM | POA: Diagnosis not present

## 2019-02-23 DIAGNOSIS — Z87828 Personal history of other (healed) physical injury and trauma: Secondary | ICD-10-CM | POA: Diagnosis not present

## 2019-02-23 DIAGNOSIS — I10 Essential (primary) hypertension: Secondary | ICD-10-CM | POA: Diagnosis not present

## 2019-02-23 DIAGNOSIS — G4733 Obstructive sleep apnea (adult) (pediatric): Secondary | ICD-10-CM | POA: Diagnosis not present

## 2019-02-23 DIAGNOSIS — E119 Type 2 diabetes mellitus without complications: Secondary | ICD-10-CM | POA: Diagnosis not present

## 2019-02-23 DIAGNOSIS — Z888 Allergy status to other drugs, medicaments and biological substances status: Secondary | ICD-10-CM | POA: Diagnosis not present

## 2019-02-23 DIAGNOSIS — Z950 Presence of cardiac pacemaker: Secondary | ICD-10-CM | POA: Diagnosis not present

## 2019-02-23 DIAGNOSIS — I441 Atrioventricular block, second degree: Secondary | ICD-10-CM | POA: Diagnosis not present

## 2019-02-23 DIAGNOSIS — Z7982 Long term (current) use of aspirin: Secondary | ICD-10-CM | POA: Diagnosis not present

## 2019-02-23 DIAGNOSIS — Z8249 Family history of ischemic heart disease and other diseases of the circulatory system: Secondary | ICD-10-CM | POA: Diagnosis not present

## 2019-02-23 DIAGNOSIS — E785 Hyperlipidemia, unspecified: Secondary | ICD-10-CM | POA: Diagnosis not present

## 2019-02-23 DIAGNOSIS — I2510 Atherosclerotic heart disease of native coronary artery without angina pectoris: Secondary | ICD-10-CM | POA: Diagnosis not present

## 2019-02-23 DIAGNOSIS — I442 Atrioventricular block, complete: Secondary | ICD-10-CM | POA: Diagnosis not present

## 2019-02-23 DIAGNOSIS — K219 Gastro-esophageal reflux disease without esophagitis: Secondary | ICD-10-CM | POA: Diagnosis not present

## 2019-02-23 DIAGNOSIS — Z7951 Long term (current) use of inhaled steroids: Secondary | ICD-10-CM | POA: Diagnosis not present

## 2019-02-23 DIAGNOSIS — Z951 Presence of aortocoronary bypass graft: Secondary | ICD-10-CM | POA: Diagnosis not present

## 2019-02-23 LAB — BASIC METABOLIC PANEL
Anion gap: 8 (ref 5–15)
BUN: 16 mg/dL (ref 8–23)
CO2: 25 mmol/L (ref 22–32)
Calcium: 9.4 mg/dL (ref 8.9–10.3)
Chloride: 106 mmol/L (ref 98–111)
Creatinine, Ser: 0.86 mg/dL (ref 0.61–1.24)
GFR calc Af Amer: >60 mL/min (ref >60)
GFR calc non Af Amer: >60 mL/min (ref >60)
Glucose, Bld: 140 mg/dL — ABNORMAL HIGH (ref 70–99)
Potassium: 3.9 mmol/L (ref 3.5–5.1)
Sodium: 139 mmol/L (ref 135–145)

## 2019-02-23 LAB — GLUCOSE, CAPILLARY: Glucose-Capillary: 128 mg/dL — ABNORMAL HIGH (ref 70–99)

## 2019-02-23 MED FILL — Lidocaine HCl Local Inj 1%: INTRAMUSCULAR | Qty: 60 | Status: AC

## 2019-02-23 NOTE — Progress Notes (Signed)
Doing well with PPM implanted  CXR reveals stable leads, no ptx  Device interrogation is personally reviewed and normal  Thompson Grayer MD, [place] 02/23/2019 8:37 AM

## 2019-02-24 SURGERY — ESOPHAGOGASTRODUODENOSCOPY (EGD) WITH PROPOFOL
Anesthesia: Monitor Anesthesia Care

## 2019-03-07 ENCOUNTER — Telehealth: Payer: Self-pay

## 2019-03-07 NOTE — Telephone Encounter (Signed)
    COVID-19 Pre-Screening Questions:  . In the past 7 to 10 days have you had a cough,  shortness of breath, headache, congestion, fever (100 or greater) body aches, chills, sore throat, or sudden loss of taste or sense of smell? No  . Have you been around anyone with known Covid 19. No . Have you been around anyone who is awaiting Covid 19 test results in the past 7 to 10 days? No . Have you been around anyone who has been exposed to Covid 19, or has mentioned symptoms of Covid 19 within the past 7 to 10 days? No  If you have any concerns/questions about symptoms patients report during screening (either on the phone or at threshold). Contact the provider seeing the patient or DOD for further guidance.  If neither are available contact a member of the leadership team.       Pt daughter answered No to all Covid-19 prescreening questions. I advised her to make sure the pt wear a mask when he comes for his appointment. I also let her know that we are limiting the amount of people who is coming into the office and if the pt can physically come alone we asked that he do so. She states that her father is hard of Hearing and her niece Magda Paganini is coming to the appointment with him. She also states that they are bringing the home monitor with them for someone can show the pt and the niece how to use the home monitor. I told her that they may have the nurse call the niece during the appointment time but I could not guarantee that they will let her on the third floor. The pt daughter verbalized understanding.

## 2019-03-08 ENCOUNTER — Other Ambulatory Visit: Payer: Self-pay

## 2019-03-08 ENCOUNTER — Ambulatory Visit (INDEPENDENT_AMBULATORY_CARE_PROVIDER_SITE_OTHER): Payer: Medicare Other | Admitting: *Deleted

## 2019-03-08 DIAGNOSIS — R001 Bradycardia, unspecified: Secondary | ICD-10-CM

## 2019-03-08 LAB — CUP PACEART INCLINIC DEVICE CHECK
Battery Remaining Longevity: 159 mo
Battery Voltage: 3.05 V
Brady Statistic RA Percent Paced: 0 %
Brady Statistic RV Percent Paced: 0 %
Date Time Interrogation Session: 20200714134204
Implantable Lead Implant Date: 20200630
Implantable Lead Implant Date: 20200630
Implantable Lead Location: 753859
Implantable Lead Location: 753860
Implantable Pulse Generator Implant Date: 20200630
Lead Channel Impedance Value: 425 Ohm
Lead Channel Impedance Value: 737.5 Ohm
Lead Channel Pacing Threshold Amplitude: 0.5 V
Lead Channel Pacing Threshold Amplitude: 0.75 V
Lead Channel Pacing Threshold Amplitude: 0.75 V
Lead Channel Pacing Threshold Pulse Width: 0.5 ms
Lead Channel Pacing Threshold Pulse Width: 0.5 ms
Lead Channel Pacing Threshold Pulse Width: 0.5 ms
Lead Channel Sensing Intrinsic Amplitude: 1.1 mV
Lead Channel Sensing Intrinsic Amplitude: 5.8 mV
Lead Channel Setting Pacing Amplitude: 0.75 V
Lead Channel Setting Pacing Amplitude: 3.5 V
Lead Channel Setting Pacing Pulse Width: 0.5 ms
Lead Channel Setting Sensing Sensitivity: 2 mV
Pulse Gen Model: 2272
Pulse Gen Serial Number: 3311958

## 2019-03-08 NOTE — Progress Notes (Addendum)
Wound check appointment. Steri-strips removed. Wound without redness, edema noted around device, instructed pt to call DC if it gets bigger. Bruising noted on L & R sides of chest & abdomen. Incision edges approximated, wound healing well. Normal device function. Thresholds, sensing, and impedances consistent with implant measurements. RA programmed at 3.5V for extra safety margin until 3 month visit, RV on autocapture. Histogram distribution appropriate for patient and level of activity. No high ventricular rates noted. 1 AMS episode, available EGMs appear to be AF on 02/25/19, duration 22 hours OAC noted. Patient educated about wound care, arm mobility, lifting restrictions. ROV w/ JA 05/27/19, remote f/u 05/24/19.

## 2019-03-09 ENCOUNTER — Ambulatory Visit: Payer: Medicare Other | Admitting: Cardiology

## 2019-03-11 ENCOUNTER — Other Ambulatory Visit: Payer: Self-pay | Admitting: Nurse Practitioner

## 2019-03-11 DIAGNOSIS — N3946 Mixed incontinence: Secondary | ICD-10-CM

## 2019-03-21 ENCOUNTER — Encounter: Payer: Self-pay | Admitting: Nurse Practitioner

## 2019-03-21 ENCOUNTER — Telehealth: Payer: Self-pay | Admitting: Nurse Practitioner

## 2019-03-21 ENCOUNTER — Other Ambulatory Visit: Payer: Self-pay

## 2019-03-21 ENCOUNTER — Ambulatory Visit (INDEPENDENT_AMBULATORY_CARE_PROVIDER_SITE_OTHER): Payer: Medicare Other | Admitting: Nurse Practitioner

## 2019-03-21 VITALS — BP 138/66 | HR 59 | Temp 96.2°F | Ht 71.0 in | Wt 208.0 lb

## 2019-03-21 DIAGNOSIS — Z6829 Body mass index (BMI) 29.0-29.9, adult: Secondary | ICD-10-CM

## 2019-03-21 DIAGNOSIS — E785 Hyperlipidemia, unspecified: Secondary | ICD-10-CM

## 2019-03-21 DIAGNOSIS — N3946 Mixed incontinence: Secondary | ICD-10-CM

## 2019-03-21 DIAGNOSIS — K219 Gastro-esophageal reflux disease without esophagitis: Secondary | ICD-10-CM

## 2019-03-21 DIAGNOSIS — I25810 Atherosclerosis of coronary artery bypass graft(s) without angina pectoris: Secondary | ICD-10-CM | POA: Diagnosis not present

## 2019-03-21 DIAGNOSIS — C61 Malignant neoplasm of prostate: Secondary | ICD-10-CM

## 2019-03-21 DIAGNOSIS — I110 Hypertensive heart disease with heart failure: Secondary | ICD-10-CM

## 2019-03-21 DIAGNOSIS — I441 Atrioventricular block, second degree: Secondary | ICD-10-CM | POA: Diagnosis not present

## 2019-03-21 DIAGNOSIS — J449 Chronic obstructive pulmonary disease, unspecified: Secondary | ICD-10-CM

## 2019-03-21 DIAGNOSIS — E1142 Type 2 diabetes mellitus with diabetic polyneuropathy: Secondary | ICD-10-CM

## 2019-03-21 DIAGNOSIS — D696 Thrombocytopenia, unspecified: Secondary | ICD-10-CM

## 2019-03-21 DIAGNOSIS — G4733 Obstructive sleep apnea (adult) (pediatric): Secondary | ICD-10-CM

## 2019-03-21 DIAGNOSIS — E538 Deficiency of other specified B group vitamins: Secondary | ICD-10-CM

## 2019-03-21 LAB — BAYER DCA HB A1C WAIVED: HB A1C (BAYER DCA - WAIVED): 6 % (ref <7.0)

## 2019-03-21 MED ORDER — OMEPRAZOLE 20 MG PO CPDR: DELAYED_RELEASE_CAPSULE | ORAL | 1 refills | Status: DC

## 2019-03-21 MED ORDER — TAMSULOSIN HCL 0.4 MG PO CAPS: 0.4000 mg | ORAL_CAPSULE | Freq: Every day | ORAL | 2 refills | Status: DC

## 2019-03-21 MED ORDER — METFORMIN HCL 500 MG PO TABS: 500.0000 mg | ORAL_TABLET | Freq: Two times a day (BID) | ORAL | 1 refills | Status: DC

## 2019-03-21 MED ORDER — BUDESONIDE-FORMOTEROL FUMARATE 160-4.5 MCG/ACT IN AERO: 2.0000 | INHALATION_SPRAY | Freq: Two times a day (BID) | RESPIRATORY_TRACT | 5 refills | Status: DC

## 2019-03-21 MED ORDER — AMLODIPINE BESYLATE 2.5 MG PO TABS: 2.5000 mg | ORAL_TABLET | Freq: Every day | ORAL | 1 refills | Status: DC

## 2019-03-21 NOTE — Patient Instructions (Signed)
Diabetes Mellitus and Foot Care Foot care is an important part of your health, especially when you have diabetes. Diabetes may cause you to have problems because of poor blood flow (circulation) to your feet and legs, which can cause your skin to:  Become thinner and drier.  Break more easily.  Heal more slowly.  Peel and crack. You may also have nerve damage (neuropathy) in your legs and feet, causing decreased feeling in them. This means that you may not notice minor injuries to your feet that could lead to more serious problems. Noticing and addressing any potential problems early is the best way to prevent future foot problems. How to care for your feet Foot hygiene  Wash your feet daily with warm water and mild soap. Do not use hot water. Then, pat your feet and the areas between your toes until they are completely dry. Do not soak your feet as this can dry your skin.  Trim your toenails straight across. Do not dig under them or around the cuticle. File the edges of your nails with an emery board or nail file.  Apply a moisturizing lotion or petroleum jelly to the skin on your feet and to dry, brittle toenails. Use lotion that does not contain alcohol and is unscented. Do not apply lotion between your toes. Shoes and socks  Wear clean socks or stockings every day. Make sure they are not too tight. Do not wear knee-high stockings since they may decrease blood flow to your legs.  Wear shoes that fit properly and have enough cushioning. Always look in your shoes before you put them on to be sure there are no objects inside.  To break in new shoes, wear them for just a few hours a day. This prevents injuries on your feet. Wounds, scrapes, corns, and calluses  Check your feet daily for blisters, cuts, bruises, sores, and redness. If you cannot see the bottom of your feet, use a mirror or ask someone for help.  Do not cut corns or calluses or try to remove them with medicine.  If you  find a minor scrape, cut, or break in the skin on your feet, keep it and the skin around it clean and dry. You may clean these areas with mild soap and water. Do not clean the area with peroxide, alcohol, or iodine.  If you have a wound, scrape, corn, or callus on your foot, look at it several times a day to make sure it is healing and not infected. Check for: ? Redness, swelling, or pain. ? Fluid or blood. ? Warmth. ? Pus or a bad smell. General instructions  Do not cross your legs. This may decrease blood flow to your feet.  Do not use heating pads or hot water bottles on your feet. They may burn your skin. If you have lost feeling in your feet or legs, you may not know this is happening until it is too late.  Protect your feet from hot and cold by wearing shoes, such as at the beach or on hot pavement.  Schedule a complete foot exam at least once a year (annually) or more often if you have foot problems. If you have foot problems, report any cuts, sores, or bruises to your health care provider immediately. Contact a health care provider if:  You have a medical condition that increases your risk of infection and you have any cuts, sores, or bruises on your feet.  You have an injury that is not   healing.  You have redness on your legs or feet.  You feel burning or tingling in your legs or feet.  You have pain or cramps in your legs and feet.  Your legs or feet are numb.  Your feet always feel cold.  You have pain around a toenail. Get help right away if:  You have a wound, scrape, corn, or callus on your foot and: ? You have pain, swelling, or redness that gets worse. ? You have fluid or blood coming from the wound, scrape, corn, or callus. ? Your wound, scrape, corn, or callus feels warm to the touch. ? You have pus or a bad smell coming from the wound, scrape, corn, or callus. ? You have a fever. ? You have a red line going up your leg. Summary  Check your feet every day  for cuts, sores, red spots, swelling, and blisters.  Moisturize feet and legs daily.  Wear shoes that fit properly and have enough cushioning.  If you have foot problems, report any cuts, sores, or bruises to your health care provider immediately.  Schedule a complete foot exam at least once a year (annually) or more often if you have foot problems. This information is not intended to replace advice given to you by your health care provider. Make sure you discuss any questions you have with your health care provider. Document Released: 08/08/2000 Document Revised: 09/23/2017 Document Reviewed: 09/12/2016 Elsevier Patient Education  2020 [employer]  

## 2019-03-21 NOTE — Progress Notes (Signed)
Subjective:    Patient ID: [name], male    DOB: 1933-06-10, 85 y.o.   MRN: [id number]   Chief Complaint: Medical Management of Chronic Issues    HPI:  1. Type 2 diabetes mellitus with diabetic polyneuropathy, unspecified whether long term insulin use (HCC) Last hgba1c was 6.5. blood sugars have been running below most of the time.   2. Hyperlipidemia with target LDL less than 100 Does not really watch diet and does very little exercise  3. Hypertensive heart disease with congestive heart failure, unspecified heart failure type ([place]) He checks his blood pressure at home and it averages I the 631 systolic. Cardiology wants to leave as is.  4. Coronary artery disease involving nonautologous biological coronary bypass graft without angina pectoris Still denies any chest pain  5. Second degree Mobitz II AV block He recently had pacemaker inserted. Had follow up at cardiology on 03/08/19 and according to note was doing well. Pacemaker working efficiently.  6. OSA (obstructive sleep apnea)  wears cpap nightly  7. Prostate cancer [facility]) Completed treatments and is doing well. Has some leakage at times and needs to wear apad.  8. Thrombocytopenia (HCC) Last platelet count was 97. We will recheck today  9. B12 deficiency Takes monthly b12 injections  10. BMI 29.0-29.9,adult No recent weigh changes    Outpatient Encounter Medications as of 03/21/2019  Medication Sig  . albuterol (PROVENTIL HFA;VENTOLIN HFA) 108 (90 Base) MCG/ACT inhaler Inhale 2 puffs into the lungs every 6 (six) hours as needed for wheezing or shortness of breath.  [name] amLODipine (NORVASC) 2.5 MG tablet Take 1 tablet (2.5 mg total) by mouth daily.  [name] aspirin EC 81 MG tablet Take 81 mg by mouth daily.  [name] atorvastatin (LIPITOR) 80 MG tablet TAKE 1 TABLET BY MOUTH EVERY DAY  . budesonide-formoterol (SYMBICORT) 160-4.5 MCG/ACT inhaler Inhale 2 puffs into the lungs 2 (two) times daily.  [name] glucose blood (ONETOUCH  VERIO) test strip Use to check blood sugars daily  . Lancets (ONETOUCH ULTRASOFT) lancets Use to check blood sugars daily  . metFORMIN (GLUCOPHAGE) 500 MG tablet TAKE 1 TABLET (500 MG TOTAL) BY MOUTH 2 (TWO) TIMES DAILY WITH A MEAL. (Patient taking differently: Take 500 mg by mouth 2 (two) times daily with a meal. TAKE 1 TABLET (500 MG TOTAL) BY MOUTH 2 (TWO) TIMES DAILY WITH A MEAL.)  . Misc Natural Products (OSTEO BI-FLEX ADV TRIPLE ST PO) Take by mouth.  . naproxen sodium (ALEVE) 220 MG tablet Take 220 mg by mouth daily as needed.  [name] omeprazole (PRILOSEC) 20 MG capsule TAKE 1 CAPSULE BY MOUTH EVERY DAY  . tamsulosin (FLOMAX) 0.4 MG CAPS capsule TAKE 1 CAPSULE (0.4 MG TOTAL) BY MOUTH DAILY AFTER SUPPER. NEEDS TO BE SEEN FOR FUTURE REFILLS     Past Surgical History:  Procedure Laterality Date  . ABDOMINAL AORTIC ANEURYSM REPAIR  ~ 2000  . cancer removed off right side of face    . CARDIAC CATHETERIZATION  07/2011  . CARDIAC CATHETERIZATION  03/30/2014   Procedure: LEFT HEART CATH AND CORS/GRAFTS ANGIOGRAPHY;  Surgeon: [name], MD;  Location: [facility] CATH LAB;  Service: Cardiovascular;;  . CHOLECYSTECTOMY  12/2001  . CORONARY ARTERY BYPASS GRAFT  08/21/2011   Procedure: CORONARY ARTERY BYPASS GRAFTING (CABG);  Surgeon: [name], MD;  Location: [place];  Service: Open Heart Surgery;  Laterality: N/A;  Coronary Artery Bypass graft on pump times three utlizing the left internal mammary artery and right  greater saphenous vein harvested endoscopically  . ERCP W/ METAL STENT PLACEMENT  12/2001   [name] 01/07/2011  . FEMORAL ARTERY ANEURYSM REPAIR  ~ 2000  . HERNIA REPAIR    . INCISIONAL HERNIA REPAIR  09/2002   [name] 01/07/2011  . INGUINAL HERNIA REPAIR Left 08/2004   [name] 01/07/2011  . INSERT / REPLACE / REMOVE PACEMAKER  02/22/2019  . LEFT HEART CATHETERIZATION WITH CORONARY ANGIOGRAM N/A 08/15/2011   Procedure: LEFT HEART CATHETERIZATION WITH CORONARY ANGIOGRAM;  Surgeon: [name], MD;  Location: [facility] C[facility] LAB;  Service: Cardiovascular;  Laterality: N/A;  . MEDIAL PARTIAL KNEE REPLACEMENT Left 2009  . PACEMAKER IMPLANT N/A 02/22/2019   Procedure: PACEMAKER IMPLANT;  Surgeon: Thompson Grayer, MD;  Location: [place] CV LAB;  Service: Cardiovascular;  Laterality: N/A;  . PROSTATE BIOPSY  ~ 2001  . UMBILICAL HERNIA REPAIR      Family History  Problem Relation Age of Onset  . Heart attack Mother   . Heart attack Father   . Heart attack Brother   . Prostate cancer Brother   . Prostate cancer Brother   . Heart attack Brother   . Colon cancer Brother        also lung cancer with mets to brain  . COPD Sister   . Emphysema Sister   . Heart disease Sister     New complaints: None today  Social history: lives with his wife. His daughter checks on him daily  Controlled substance contract: N/A   Review of Systems  Constitutional: Negative for activity change and appetite change.  HENT: Negative.   Eyes: Negative for pain.  Respiratory: Negative for shortness of breath.   Cardiovascular: Negative for chest pain, palpitations and leg swelling.  Gastrointestinal: Negative for abdominal pain.  Endocrine: Negative for polydipsia.  Genitourinary: Negative.   Skin: Negative for rash.  Neurological: Negative for dizziness, weakness and headaches.  Hematological: Does not bruise/bleed easily.  Psychiatric/Behavioral: Negative.   All other systems reviewed and are negative.      Objective:   Physical Exam Vitals signs and nursing note reviewed.  Constitutional:      Appearance: Normal appearance. He is well-developed and normal weight.  HENT:     Head: Normocephalic.     Nose: Nose normal.  Eyes:     Pupils: Pupils are equal, round, and reactive to light.  Neck:     Musculoskeletal: Normal range of motion and neck supple.     Thyroid: No thyroid mass or thyromegaly.     Vascular: No carotid bruit or JVD.     Trachea: Phonation normal.   Cardiovascular:     Rate and Rhythm: Normal rate and regular rhythm.  Pulmonary:     Effort: Pulmonary effort is normal. No respiratory distress.     Breath sounds: Normal breath sounds.  Abdominal:     General: Bowel sounds are normal.     Palpations: Abdomen is soft.     Tenderness: There is no abdominal tenderness.  Musculoskeletal: Normal range of motion.  Lymphadenopathy:     Cervical: No cervical adenopathy.  Skin:    General: Skin is warm and dry.  Neurological:     Mental Status: He is alert and oriented to person, place, and time.  Psychiatric:        Behavior: Behavior normal.        Thought Content: Thought content normal.        Judgment: Judgment normal.    BP 138/66  Pulse (!) 59   Temp (!) 96.2 F (35.7 C) (Oral)   Ht '5\' 11"'$  (1.803 m)   Wt 208 lb (94.3 kg)   BMI 29.01 kg/m    hgba1c 6.0    Assessment & Plan:  [name] comes in today with chief complaint of Medical Management of Chronic Issues   Diagnosis and orders addressed:  1. Type 2 diabetes mellitus with diabetic polyneuropathy, unspecified whether long term insulin use (HCC) Continue to watch carbs in diet - Bayer DCA Hb A1c Waived - metFORMIN (GLUCOPHAGE) 500 MG tablet; Take 1 tablet (500 mg total) by mouth 2 (two) times daily with a meal. TAKE 1 TABLET (500 MG TOTAL) BY MOUTH 2 (TWO) TIMES DAILY WITH A MEAL.  Dispense: 180 tablet; Refill: 1  2. Hyperlipidemia with target LDL less than 100 Low fat diet - Lipid panel  3. Hypertensive heart disease with congestive heart failure, unspecified heart failure type (HCC) Low sodium diet - CMP14+EGFR - amLODipine (NORVASC) 2.5 MG tablet; Take 1 tablet (2.5 mg total) by mouth daily.  Dispense: 90 tablet; Refill: 1  4. Coronary artery disease involving nonautologous biological coronary bypass graft without angina pectoris Keep follow up with cardiology  5. Second degree Mobitz II AV block Check pace maker daily  6. OSA (obstructive sleep  apnea) Wear cpap nightly  7. Prostate cancer [facility]) followup with urology if develop problems  8. Thrombocytopenia ([place]) Labs oending  9. B12 deficiency Continue monytjly b12 injections  10. BMI 29.0-29.9,adult Discussed diet and exercise for person with BMI >25 Will recheck weight in 3-6 months  11. COPD with asthma ([place]) - budesonide-formoterol (SYMBICORT) 160-4.5 MCG/ACT inhaler; Inhale 2 puffs into the lungs 2 (two) times daily.  Dispense: 1 Inhaler; Refill: 5  12. Gastroesophageal reflux disease without esophagitis .Avoid spicy foods Do not eat 2 hours prior to bedtime  - omeprazole (PRILOSEC) 20 MG capsule; TAKE 1 CAPSULE BY MOUTH EVERY DAY  Dispense: 90 capsule; Refill: 1  13. Mixed incontinence - tamsulosin (FLOMAX) 0.4 MG CAPS capsule; Take 1 capsule (0.4 mg total) by mouth daily after supper. Needs to be seen for future refills  Dispense: 30 capsule; Refill: 2    Labs pending Health Maintenance reviewed Diet and exercise encouraged  Follow up plan: 3 months   [name] [name], [name]

## 2019-03-22 ENCOUNTER — Other Ambulatory Visit: Payer: Self-pay | Admitting: Nurse Practitioner

## 2019-03-22 DIAGNOSIS — I110 Hypertensive heart disease with heart failure: Secondary | ICD-10-CM

## 2019-03-22 DIAGNOSIS — N3946 Mixed incontinence: Secondary | ICD-10-CM

## 2019-03-22 DIAGNOSIS — K219 Gastro-esophageal reflux disease without esophagitis: Secondary | ICD-10-CM

## 2019-03-22 DIAGNOSIS — J449 Chronic obstructive pulmonary disease, unspecified: Secondary | ICD-10-CM

## 2019-03-22 DIAGNOSIS — E1142 Type 2 diabetes mellitus with diabetic polyneuropathy: Secondary | ICD-10-CM

## 2019-03-22 LAB — CMP14+EGFR
ALT: 12 IU/L (ref 0–44)
AST: 18 IU/L (ref 0–40)
Albumin/Globulin Ratio: 2.2 (ref 1.2–2.2)
Albumin: 4.2 g/dL (ref 3.6–4.6)
Alkaline Phosphatase: 82 IU/L (ref 39–117)
BUN/Creatinine Ratio: 17 (ref 10–24)
BUN: 15 mg/dL (ref 8–27)
Bilirubin Total: 1.3 mg/dL — ABNORMAL HIGH (ref 0.0–1.2)
CO2: 26 mmol/L (ref 20–29)
Calcium: 9.8 mg/dL (ref 8.6–10.2)
Chloride: 104 mmol/L (ref 96–106)
Creatinine, Ser: 0.89 mg/dL (ref 0.76–1.27)
GFR calc Af Amer: 91 mL/min/{1.73_m2} (ref >59)
GFR calc non Af Amer: 79 mL/min/{1.73_m2} (ref >59)
Globulin, Total: 1.9 g/dL (ref 1.5–4.5)
Glucose: 117 mg/dL — ABNORMAL HIGH (ref 65–99)
Potassium: 4.8 mmol/L (ref 3.5–5.2)
Sodium: 144 mmol/L (ref 134–144)
Total Protein: 6.1 g/dL (ref 6.0–8.5)

## 2019-03-22 LAB — LIPID PANEL
Chol/HDL Ratio: 3 ratio (ref 0.0–5.0)
Cholesterol, Total: 100 mg/dL (ref 100–199)
HDL: 33 mg/dL — ABNORMAL LOW (ref >39)
LDL Calculated: 44 mg/dL (ref 0–99)
Triglycerides: 115 mg/dL (ref 0–149)
VLDL Cholesterol Cal: 23 mg/dL (ref 5–40)

## 2019-03-22 MED ORDER — AMLODIPINE BESYLATE 2.5 MG PO TABS: 2.5000 mg | ORAL_TABLET | Freq: Every day | ORAL | 1 refills | Status: DC

## 2019-03-22 MED ORDER — MECLIZINE HCL 25 MG PO TABS: 25.0000 mg | ORAL_TABLET | Freq: Three times a day (TID) | ORAL | 0 refills | Status: DC | PRN

## 2019-03-22 MED ORDER — OMEPRAZOLE 20 MG PO CPDR: DELAYED_RELEASE_CAPSULE | ORAL | 1 refills | Status: DC

## 2019-03-22 MED ORDER — METFORMIN HCL 500 MG PO TABS: 500.0000 mg | ORAL_TABLET | Freq: Two times a day (BID) | ORAL | 1 refills | Status: DC

## 2019-03-22 MED ORDER — BUDESONIDE-FORMOTEROL FUMARATE 160-4.5 MCG/ACT IN AERO: 2.0000 | INHALATION_SPRAY | Freq: Two times a day (BID) | RESPIRATORY_TRACT | 5 refills | Status: DC

## 2019-03-22 MED ORDER — TAMSULOSIN HCL 0.4 MG PO CAPS: 0.4000 mg | ORAL_CAPSULE | Freq: Every day | ORAL | 0 refills | Status: DC

## 2019-03-22 NOTE — Telephone Encounter (Signed)
Needing refill on Meclizine Please advise

## 2019-03-22 NOTE — Telephone Encounter (Signed)
LMOM explaining, told to call back for any questions.

## 2019-03-22 NOTE — Telephone Encounter (Signed)
All meds were sent to mail order except meclizine which was sent to [facility] because mail order will only do 90 day supply and we do not do 90 days of meclizine at a time.

## 2019-03-29 ENCOUNTER — Other Ambulatory Visit: Payer: Self-pay | Admitting: Nurse Practitioner

## 2019-03-29 DIAGNOSIS — E1142 Type 2 diabetes mellitus with diabetic polyneuropathy: Secondary | ICD-10-CM

## 2019-04-22 ENCOUNTER — Ambulatory Visit: Payer: Medicare Other | Admitting: Internal Medicine

## 2019-05-06 ENCOUNTER — Other Ambulatory Visit: Payer: Self-pay | Admitting: *Deleted

## 2019-05-06 DIAGNOSIS — E785 Hyperlipidemia, unspecified: Secondary | ICD-10-CM

## 2019-05-06 MED ORDER — ATORVASTATIN CALCIUM 80 MG PO TABS: 80.0000 mg | ORAL_TABLET | Freq: Every day | ORAL | 1 refills | Status: DC

## 2019-05-16 ENCOUNTER — Telehealth: Payer: Self-pay | Admitting: *Deleted

## 2019-05-16 NOTE — Telephone Encounter (Signed)
[name] alert received for increased AT/AF burden and long AT/AF episode. AF episode in progress as of 02:49 on 05/15/19, ongoing since 05/13/19 at 17:19. Per med list, patient is taking ASA, not currently anticoagulated. V rate is controlled. Also noted RV AutoCapture trend is variable, output is currently programmed at 4.0V @ 0.39ms.   Spoke with patient and wife. Pt reports that for the past couple of days, he has "felt like [he] did before the pacemaker." He denies dizziness or syncope, but reports that he can't walk far without feeling fatigued, weak, and SOB. He is scheduled for f/u with Dr. Harl Bowie on 05/17/19 at 15:00, and f/u with Dr. [name] on 05/27/19. Advised I will forward this information to both MDs. ED precautions given for any worsening cardiac symptoms in the interim. Pt denies additional questions at this time. [employer] rep will be present at appointment on 9/22 to assess RV threshold and fix RA/RV outputs at chronic settings.  Recent BPs: 9/21- 134/62 9/19- 133/78 9/17- 150/72

## 2019-05-17 ENCOUNTER — Ambulatory Visit: Payer: Medicare Other | Admitting: Cardiology

## 2019-05-17 ENCOUNTER — Other Ambulatory Visit: Payer: Self-pay

## 2019-05-17 ENCOUNTER — Encounter: Payer: Self-pay | Admitting: Cardiology

## 2019-05-17 VITALS — BP 114/68 | HR 68 | Ht 70.0 in | Wt 211.4 lb

## 2019-05-17 DIAGNOSIS — I2510 Atherosclerotic heart disease of native coronary artery without angina pectoris: Secondary | ICD-10-CM | POA: Diagnosis not present

## 2019-05-17 DIAGNOSIS — I493 Ventricular premature depolarization: Secondary | ICD-10-CM | POA: Diagnosis not present

## 2019-05-17 DIAGNOSIS — I480 Paroxysmal atrial fibrillation: Secondary | ICD-10-CM | POA: Diagnosis not present

## 2019-05-17 DIAGNOSIS — Z950 Presence of cardiac pacemaker: Secondary | ICD-10-CM

## 2019-05-17 DIAGNOSIS — I10 Essential (primary) hypertension: Secondary | ICD-10-CM | POA: Diagnosis not present

## 2019-05-17 MED ORDER — APIXABAN 5 MG PO TABS: 5.0000 mg | ORAL_TABLET | Freq: Two times a day (BID) | ORAL | 3 refills | Status: DC

## 2019-05-17 MED ORDER — APIXABAN 5 MG PO TABS: 5.0000 mg | ORAL_TABLET | Freq: Two times a day (BID) | ORAL | 0 refills | Status: DC
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
## 2019-05-17 NOTE — Patient Instructions (Addendum)
Your physician recommends that you schedule a follow-up appointment in: [place] has recommended you make the following change in your medication:   STOP ASPRIN   START ELIQUIS 5 MG TWICE DAILY  Your physician recommends that you return for lab work CBC   Thank you for choosing [facility]!!

## 2019-05-18 ENCOUNTER — Other Ambulatory Visit: Payer: Medicare Other

## 2019-05-18 ENCOUNTER — Other Ambulatory Visit: Payer: Self-pay | Admitting: *Deleted

## 2019-05-18 ENCOUNTER — Other Ambulatory Visit: Payer: Self-pay

## 2019-05-18 DIAGNOSIS — I4430 Unspecified atrioventricular block: Secondary | ICD-10-CM

## 2019-05-19 LAB — CBC
Hematocrit: 39.2 % (ref 37.5–51.0)
Hemoglobin: 13.3 g/dL (ref 13.0–17.7)
MCH: 31.8 pg (ref 26.6–33.0)
MCHC: 33.9 g/dL (ref 31.5–35.7)
MCV: 94 fL (ref 79–97)
Platelets: 83 10*3/uL — CL (ref 150–450)
RBC: 4.18 x10E6/uL (ref 4.14–5.80)
RDW: 14.5 % (ref 11.6–15.4)
WBC: 6.3 10*3/uL (ref 3.4–10.8)

## 2019-05-20 ENCOUNTER — Other Ambulatory Visit: Payer: Self-pay

## 2019-05-20 ENCOUNTER — Telehealth: Payer: Self-pay | Admitting: *Deleted

## 2019-05-20 ENCOUNTER — Encounter: Payer: Self-pay | Admitting: Family Medicine

## 2019-05-20 ENCOUNTER — Telehealth: Payer: Self-pay | Admitting: Cardiology

## 2019-05-20 ENCOUNTER — Ambulatory Visit (INDEPENDENT_AMBULATORY_CARE_PROVIDER_SITE_OTHER): Payer: Medicare Other | Admitting: Family Medicine

## 2019-05-20 ENCOUNTER — Ambulatory Visit (INDEPENDENT_AMBULATORY_CARE_PROVIDER_SITE_OTHER): Payer: Medicare Other

## 2019-05-20 ENCOUNTER — Telehealth: Payer: Self-pay | Admitting: Nurse Practitioner

## 2019-05-20 VITALS — BP 135/75 | Ht 70.0 in | Wt 212.0 lb

## 2019-05-20 DIAGNOSIS — R0600 Dyspnea, unspecified: Secondary | ICD-10-CM

## 2019-05-20 DIAGNOSIS — R0609 Other forms of dyspnea: Secondary | ICD-10-CM | POA: Diagnosis not present

## 2019-05-20 DIAGNOSIS — R3121 Asymptomatic microscopic hematuria: Secondary | ICD-10-CM

## 2019-05-20 DIAGNOSIS — R8290 Unspecified abnormal findings in urine: Secondary | ICD-10-CM

## 2019-05-20 DIAGNOSIS — D696 Thrombocytopenia, unspecified: Secondary | ICD-10-CM | POA: Diagnosis not present

## 2019-05-20 DIAGNOSIS — J439 Emphysema, unspecified: Secondary | ICD-10-CM | POA: Diagnosis not present

## 2019-05-20 DIAGNOSIS — R0602 Shortness of breath: Secondary | ICD-10-CM

## 2019-05-20 LAB — URINALYSIS, COMPLETE
Bilirubin, UA: NEGATIVE
Glucose, UA: NEGATIVE
Ketones, UA: NEGATIVE
Leukocytes,UA: NEGATIVE
Nitrite, UA: NEGATIVE
Protein,UA: NEGATIVE
Specific Gravity, UA: 1.015 (ref 1.005–1.030)
Urobilinogen, Ur: 2 mg/dL — ABNORMAL HIGH (ref 0.2–1.0)
pH, UA: 5.5 (ref 5.0–7.5)

## 2019-05-20 LAB — MICROSCOPIC EXAMINATION
Bacteria, UA: NONE SEEN
Epithelial Cells (non renal): NONE SEEN /hpf (ref 0–10)
Renal Epithel, UA: NONE SEEN /hpf

## 2019-05-20 MED ORDER — AMIODARONE HCL 200 MG PO TABS: ORAL_TABLET | ORAL | 1 refills | Status: DC

## 2019-05-20 NOTE — Telephone Encounter (Signed)
Pt daughter teresa made aware - pt will have labs done at [facility]

## 2019-05-20 NOTE — Telephone Encounter (Signed)
Medication sent to pharmacy  

## 2019-05-20 NOTE — Telephone Encounter (Signed)
-----   Message from Arnoldo Lenis, MD sent at 05/20/2019  2:59 PM EDT ----- Can we also get a cbc for him in 2 months   Zandra Abts MD

## 2019-05-20 NOTE — Telephone Encounter (Signed)
Daughter concerned patient in AF. Reports he has been SOB and requested to go to see PCP today. Is at appointment at this time. Patient was started Eliquis and had CBC done per DR Harl Bowie. CBC results in Epic, PLTs less than 89. Daughter wanted to know if we could see what PPM was showing at this time. Explained that unless patient is near his remote monitor a transmission can not be sent.  Reassured patient that PCP has access to Epic notes by Dr Harl Bowie and Dr [name] and that patient has f/u with Dr [name] in [place] on 05/27/19. Daughter reports patient will send a remote to clinic when he gets home from appointment with PCP.

## 2019-05-20 NOTE — Patient Instructions (Addendum)
I am not sure about the platelet count of under 100 for Eliquis (his was 80 recently). I was unable to find any information of this being a contraindication. I will reach out to your cardiologist about this to see if they can provide additional input.  Your xray did not show any acute findings to explain your symptoms.  I will call you with results once available from radiology. I am going to reach out to Dr [name] about you anticoagulation.  I am referring you to the urologist about the microscopic blood in your urine.

## 2019-05-20 NOTE — Telephone Encounter (Signed)
-----   Message from Arnoldo Lenis, MD sent at 05/20/2019  2:03 PM EDT ----- Spoke with his daughter who is the CCU nurse. I have recommended to continue the eliquis, I am ok with his platelets. Also start amiodarone 400mg  bid x 7 days, then 200mg  bid x 2 weeks, then 200mg  daily. Please [facility] in [place]
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
## 2019-05-20 NOTE — Telephone Encounter (Signed)
Pt daughter called thinks pt is in afib and are going to pcp today - was asking about recent cbc results (Epic) was waiting on those results before starting Eliquis - daughter is requesting that device clinic contact her (device was interrogated at Tuesdays appt) will forward to provider and device clinic

## 2019-05-20 NOTE — Telephone Encounter (Signed)
Asking about if test results are back   patient not feeling well - thinks he is back into AFIB

## 2019-05-21 LAB — URINE CULTURE: Organism ID, Bacteria: NO GROWTH

## 2019-05-24 ENCOUNTER — Ambulatory Visit (INDEPENDENT_AMBULATORY_CARE_PROVIDER_SITE_OTHER): Payer: Medicare Other | Admitting: *Deleted

## 2019-05-24 DIAGNOSIS — I441 Atrioventricular block, second degree: Secondary | ICD-10-CM | POA: Diagnosis not present

## 2019-05-24 DIAGNOSIS — R310 Gross hematuria: Secondary | ICD-10-CM | POA: Diagnosis not present

## 2019-05-24 LAB — CUP PACEART REMOTE DEVICE CHECK
Battery Remaining Longevity: 107 mo
Battery Remaining Percentage: 95.5 %
Battery Voltage: 3.02 V
Brady Statistic AP VP Percent: 17 %
Brady Statistic AP VS Percent: 1 %
Brady Statistic AS VP Percent: 68 %
Brady Statistic AS VS Percent: 7.1 %
Brady Statistic RA Percent Paced: 9.9 %
Brady Statistic RV Percent Paced: 85 %
Date Time Interrogation Session: 20200929065303
Implantable Lead Implant Date: 20200630
Implantable Lead Implant Date: 20200630
Implantable Lead Location: 753859
Implantable Lead Location: 753860
Implantable Pulse Generator Implant Date: 20200630
Lead Channel Impedance Value: 430 Ohm
Lead Channel Impedance Value: 630 Ohm
Lead Channel Pacing Threshold Amplitude: 0.5 V
Lead Channel Pacing Threshold Amplitude: 0.75 V
Lead Channel Pacing Threshold Pulse Width: 0.5 ms
Lead Channel Pacing Threshold Pulse Width: 0.5 ms
Lead Channel Sensing Intrinsic Amplitude: 1.1 mV
Lead Channel Sensing Intrinsic Amplitude: 12 mV
Lead Channel Setting Pacing Amplitude: 2 V
Lead Channel Setting Pacing Amplitude: 2.5 V
Lead Channel Setting Pacing Pulse Width: 0.5 ms
Lead Channel Setting Sensing Sensitivity: 2 mV
Pulse Gen Model: 2272
Pulse Gen Serial Number: 3311958

## 2019-05-27 ENCOUNTER — Encounter: Payer: Medicare Other | Admitting: Internal Medicine

## 2019-05-27 DIAGNOSIS — N200 Calculus of kidney: Secondary | ICD-10-CM | POA: Diagnosis not present

## 2019-05-27 DIAGNOSIS — R310 Gross hematuria: Secondary | ICD-10-CM | POA: Diagnosis not present

## 2019-05-31 NOTE — Telephone Encounter (Signed)
Daughter aware.

## 2019-05-31 NOTE — Telephone Encounter (Signed)
-----   Message from [name], DO sent at 05/31/2019  3:22 PM EDT ----- Please inform patient that I reached out to his cardiologist, Dr [name], and they have recommended that he stay on Eliquis.  They did not recommend switching to Coumadin.

## 2019-06-01 ENCOUNTER — Telehealth: Payer: Self-pay | Admitting: Family Medicine

## 2019-06-01 DIAGNOSIS — I110 Hypertensive heart disease with heart failure: Secondary | ICD-10-CM

## 2019-06-01 MED ORDER — AMLODIPINE BESYLATE 2.5 MG PO TABS: 2.5000 mg | ORAL_TABLET | Freq: Every day | ORAL | 1 refills | Status: DC

## 2019-06-01 NOTE — Telephone Encounter (Signed)
What is the name of the medication? amLODipine (NORVASC) 2.5 MG tablet  Have you contacted your pharmacy to request a refill? Yes but no response  Which pharmacy would you [facility]   Patient notified that their request is being sent to the clinical staff for review and that they should receive a call once it is complete. If they do not receive a call within 24 hours they can check with their pharmacy or our office.

## 2019-06-01 NOTE — Telephone Encounter (Signed)
Patient aware rx sent to pharmacy.  

## 2019-06-02 NOTE — Progress Notes (Signed)
Remote pacemaker transmission.   

## 2019-06-03 ENCOUNTER — Other Ambulatory Visit: Payer: Self-pay | Admitting: Nurse Practitioner

## 2019-06-03 ENCOUNTER — Ambulatory Visit: Payer: Medicare Other | Admitting: Internal Medicine

## 2019-06-03 DIAGNOSIS — N3946 Mixed incontinence: Secondary | ICD-10-CM

## 2019-06-08 ENCOUNTER — Other Ambulatory Visit: Payer: Self-pay | Admitting: Family Medicine

## 2019-06-15 ENCOUNTER — Other Ambulatory Visit: Payer: Self-pay | Admitting: Nurse Practitioner

## 2019-06-15 DIAGNOSIS — N3946 Mixed incontinence: Secondary | ICD-10-CM

## 2019-06-16 DIAGNOSIS — E119 Type 2 diabetes mellitus without complications: Secondary | ICD-10-CM | POA: Diagnosis not present

## 2019-06-16 DIAGNOSIS — H5703 Miosis: Secondary | ICD-10-CM | POA: Diagnosis not present

## 2019-06-16 DIAGNOSIS — H25813 Combined forms of age-related cataract, bilateral: Secondary | ICD-10-CM | POA: Diagnosis not present

## 2019-06-16 LAB — HM DIABETES EYE EXAM

## 2019-06-20 ENCOUNTER — Telehealth: Payer: Self-pay | Admitting: Student

## 2019-06-20 DIAGNOSIS — D229 Melanocytic nevi, unspecified: Secondary | ICD-10-CM | POA: Diagnosis not present

## 2019-06-20 DIAGNOSIS — L821 Other seborrheic keratosis: Secondary | ICD-10-CM | POA: Diagnosis not present

## 2019-06-20 NOTE — Telephone Encounter (Signed)
Spoke with patients daughter, [name], she request appt next Wednesday 06/29/2019 and is aware of time and location of A-Fib clinic.

## 2019-06-20 NOTE — Telephone Encounter (Signed)
Care alert for exceeding AF burden. Started on Eliquis last month. Currently on amiodarone.   Pt continues to have paroxysmal, symptomatic AF with significant fatigue. Daughter requesting an appointment in AF clinic to look at further options.  Thank you  Daughter at [phone] says Wednesdays work best, but if another day is necessary, one of her other sisters could bring him.

## 2019-06-24 ENCOUNTER — Other Ambulatory Visit: Payer: Self-pay | Admitting: *Deleted

## 2019-06-24 DIAGNOSIS — I110 Hypertensive heart disease with heart failure: Secondary | ICD-10-CM

## 2019-06-24 DIAGNOSIS — R310 Gross hematuria: Secondary | ICD-10-CM | POA: Diagnosis not present

## 2019-06-24 MED ORDER — AMLODIPINE BESYLATE 2.5 MG PO TABS: 2.5000 mg | ORAL_TABLET | Freq: Every day | ORAL | 0 refills | Status: DC

## 2019-06-27 ENCOUNTER — Ambulatory Visit: Payer: Self-pay | Admitting: Nurse Practitioner

## 2019-06-29 ENCOUNTER — Other Ambulatory Visit: Payer: Self-pay

## 2019-06-29 ENCOUNTER — Encounter (HOSPITAL_COMMUNITY): Payer: Self-pay | Admitting: Physician Assistant

## 2019-06-29 ENCOUNTER — Ambulatory Visit (HOSPITAL_COMMUNITY)
Admission: RE | Admit: 2019-06-29 | Discharge: 2019-06-29 | Disposition: A | Discharge status: Home / Self Care | Payer: Medicare Other | Source: Ambulatory Visit | Attending: Physician Assistant | Admitting: Physician Assistant

## 2019-06-29 VITALS — BP 140/70 | HR 71 | Ht 70.0 in | Wt 207.8 lb

## 2019-06-29 DIAGNOSIS — Z79899 Other long term (current) drug therapy: Secondary | ICD-10-CM | POA: Insufficient documentation

## 2019-06-29 DIAGNOSIS — Z87891 Personal history of nicotine dependence: Secondary | ICD-10-CM | POA: Insufficient documentation

## 2019-06-29 DIAGNOSIS — I10 Essential (primary) hypertension: Secondary | ICD-10-CM | POA: Diagnosis not present

## 2019-06-29 DIAGNOSIS — Z6829 Body mass index (BMI) 29.0-29.9, adult: Secondary | ICD-10-CM | POA: Insufficient documentation

## 2019-06-29 DIAGNOSIS — Z8042 Family history of malignant neoplasm of prostate: Secondary | ICD-10-CM | POA: Diagnosis not present

## 2019-06-29 DIAGNOSIS — D6869 Other thrombophilia: Secondary | ICD-10-CM | POA: Insufficient documentation

## 2019-06-29 DIAGNOSIS — Z87442 Personal history of urinary calculi: Secondary | ICD-10-CM | POA: Insufficient documentation

## 2019-06-29 DIAGNOSIS — Z888 Allergy status to other drugs, medicaments and biological substances status: Secondary | ICD-10-CM | POA: Diagnosis not present

## 2019-06-29 DIAGNOSIS — Z8673 Personal history of transient ischemic attack (TIA), and cerebral infarction without residual deficits: Secondary | ICD-10-CM | POA: Diagnosis not present

## 2019-06-29 DIAGNOSIS — Z8546 Personal history of malignant neoplasm of prostate: Secondary | ICD-10-CM | POA: Insufficient documentation

## 2019-06-29 DIAGNOSIS — M1990 Unspecified osteoarthritis, unspecified site: Secondary | ICD-10-CM | POA: Diagnosis not present

## 2019-06-29 DIAGNOSIS — I480 Paroxysmal atrial fibrillation: Secondary | ICD-10-CM | POA: Insufficient documentation

## 2019-06-29 DIAGNOSIS — I2510 Atherosclerotic heart disease of native coronary artery without angina pectoris: Secondary | ICD-10-CM | POA: Insufficient documentation

## 2019-06-29 DIAGNOSIS — Z951 Presence of aortocoronary bypass graft: Secondary | ICD-10-CM | POA: Insufficient documentation

## 2019-06-29 DIAGNOSIS — Z7952 Long term (current) use of systemic steroids: Secondary | ICD-10-CM | POA: Diagnosis not present

## 2019-06-29 DIAGNOSIS — Z7901 Long term (current) use of anticoagulants: Secondary | ICD-10-CM | POA: Insufficient documentation

## 2019-06-29 DIAGNOSIS — K219 Gastro-esophageal reflux disease without esophagitis: Secondary | ICD-10-CM | POA: Insufficient documentation

## 2019-06-29 DIAGNOSIS — Z8249 Family history of ischemic heart disease and other diseases of the circulatory system: Secondary | ICD-10-CM | POA: Diagnosis not present

## 2019-06-29 DIAGNOSIS — G4733 Obstructive sleep apnea (adult) (pediatric): Secondary | ICD-10-CM | POA: Insufficient documentation

## 2019-06-29 DIAGNOSIS — E669 Obesity, unspecified: Secondary | ICD-10-CM | POA: Insufficient documentation

## 2019-06-29 DIAGNOSIS — E119 Type 2 diabetes mellitus without complications: Secondary | ICD-10-CM | POA: Diagnosis not present

## 2019-06-29 DIAGNOSIS — Z950 Presence of cardiac pacemaker: Secondary | ICD-10-CM | POA: Insufficient documentation

## 2019-06-29 DIAGNOSIS — Z7984 Long term (current) use of oral hypoglycemic drugs: Secondary | ICD-10-CM | POA: Insufficient documentation

## 2019-06-29 DIAGNOSIS — E785 Hyperlipidemia, unspecified: Secondary | ICD-10-CM | POA: Insufficient documentation

## 2019-06-29 DIAGNOSIS — I447 Left bundle-branch block, unspecified: Secondary | ICD-10-CM | POA: Insufficient documentation

## 2019-06-29 DIAGNOSIS — Z923 Personal history of irradiation: Secondary | ICD-10-CM | POA: Diagnosis not present

## 2019-06-29 DIAGNOSIS — Z825 Family history of asthma and other chronic lower respiratory diseases: Secondary | ICD-10-CM | POA: Insufficient documentation

## 2019-06-29 DIAGNOSIS — Z800 Family history of malignant neoplasm of digestive organs: Secondary | ICD-10-CM | POA: Insufficient documentation

## 2019-06-29 MED ORDER — METOPROLOL SUCCINATE ER 25 MG PO TB24: 25.0000 mg | ORAL_TABLET | Freq: Every day | ORAL | 6 refills | Status: DC
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
## 2019-07-01 ENCOUNTER — Encounter: Payer: Medicare Other | Admitting: Internal Medicine

## 2019-07-05 ENCOUNTER — Encounter: Payer: Self-pay | Admitting: Family Medicine

## 2019-07-05 ENCOUNTER — Other Ambulatory Visit: Payer: Self-pay

## 2019-07-05 ENCOUNTER — Ambulatory Visit (INDEPENDENT_AMBULATORY_CARE_PROVIDER_SITE_OTHER): Payer: Medicare Other | Admitting: Family Medicine

## 2019-07-05 VITALS — BP 118/65 | HR 58 | Temp 97.7°F | Ht 70.0 in | Wt 208.0 lb

## 2019-07-05 DIAGNOSIS — E1169 Type 2 diabetes mellitus with other specified complication: Secondary | ICD-10-CM | POA: Diagnosis not present

## 2019-07-05 DIAGNOSIS — E1142 Type 2 diabetes mellitus with diabetic polyneuropathy: Secondary | ICD-10-CM

## 2019-07-05 DIAGNOSIS — D696 Thrombocytopenia, unspecified: Secondary | ICD-10-CM | POA: Diagnosis not present

## 2019-07-05 DIAGNOSIS — I110 Hypertensive heart disease with heart failure: Secondary | ICD-10-CM | POA: Diagnosis not present

## 2019-07-05 DIAGNOSIS — E785 Hyperlipidemia, unspecified: Secondary | ICD-10-CM | POA: Diagnosis not present

## 2019-07-05 LAB — BAYER DCA HB A1C WAIVED: HB A1C (BAYER DCA - WAIVED): 6.1 % (ref <7.0)
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
## 2019-07-05 NOTE — Patient Instructions (Signed)

## 2019-07-06 ENCOUNTER — Telehealth: Payer: Self-pay | Admitting: Family Medicine

## 2019-07-06 LAB — CBC
Hematocrit: 40.1 % (ref 37.5–51.0)
Hemoglobin: 13.4 g/dL (ref 13.0–17.7)
MCH: 30.9 pg (ref 26.6–33.0)
MCHC: 33.4 g/dL (ref 31.5–35.7)
MCV: 93 fL (ref 79–97)
Platelets: 91 10*3/uL — CL (ref 150–450)
RBC: 4.33 x10E6/uL (ref 4.14–5.80)
RDW: 15.3 % (ref 11.6–15.4)
WBC: 5.1 10*3/uL (ref 3.4–10.8)

## 2019-07-06 LAB — BASIC METABOLIC PANEL
BUN/Creatinine Ratio: 16 (ref 10–24)
BUN: 15 mg/dL (ref 8–27)
CO2: 27 mmol/L (ref 20–29)
Calcium: 9.3 mg/dL (ref 8.6–10.2)
Chloride: 107 mmol/L — ABNORMAL HIGH (ref 96–106)
Creatinine, Ser: 0.96 mg/dL (ref 0.76–1.27)
GFR calc Af Amer: 84 mL/min/{1.73_m2} (ref >59)
GFR calc non Af Amer: 72 mL/min/{1.73_m2} (ref >59)
Glucose: 125 mg/dL — ABNORMAL HIGH (ref 65–99)
Potassium: 4.3 mmol/L (ref 3.5–5.2)
Sodium: 146 mmol/L — ABNORMAL HIGH (ref 134–144)

## 2019-07-06 LAB — TSH: TSH: 2.57 u[IU]/mL (ref 0.450–4.500)

## 2019-07-06 LAB — MICROALBUMIN / CREATININE URINE RATIO
Creatinine, Urine: 197.8 mg/dL
Microalb/Creat Ratio: 42 mg/g creat — ABNORMAL HIGH (ref 0–29)
Microalbumin, Urine: 82.2 ug/mL

## 2019-07-06 MED ORDER — ONETOUCH VERIO VI STRP: ORAL_STRIP | 3 refills | Status: DC

## 2019-07-06 NOTE — Telephone Encounter (Signed)
Refill of test strips ordered and sent to [facility].

## 2019-07-07 ENCOUNTER — Encounter (HOSPITAL_COMMUNITY): Payer: Self-pay | Admitting: Emergency Medicine

## 2019-07-07 ENCOUNTER — Emergency Department (HOSPITAL_COMMUNITY)
Admission: EM | Admit: 2019-07-07 | Discharge: 2019-07-07 | Disposition: A | Discharge status: Home / Self Care | Payer: Medicare Other | Attending: Emergency Medicine | Admitting: Emergency Medicine

## 2019-07-07 ENCOUNTER — Emergency Department (HOSPITAL_COMMUNITY): Payer: Medicare Other

## 2019-07-07 ENCOUNTER — Other Ambulatory Visit: Payer: Self-pay

## 2019-07-07 DIAGNOSIS — R072 Precordial pain: Secondary | ICD-10-CM

## 2019-07-07 DIAGNOSIS — Z96652 Presence of left artificial knee joint: Secondary | ICD-10-CM | POA: Insufficient documentation

## 2019-07-07 DIAGNOSIS — J449 Chronic obstructive pulmonary disease, unspecified: Secondary | ICD-10-CM | POA: Insufficient documentation

## 2019-07-07 DIAGNOSIS — I2510 Atherosclerotic heart disease of native coronary artery without angina pectoris: Secondary | ICD-10-CM | POA: Diagnosis not present

## 2019-07-07 DIAGNOSIS — Z951 Presence of aortocoronary bypass graft: Secondary | ICD-10-CM | POA: Diagnosis not present

## 2019-07-07 DIAGNOSIS — I10 Essential (primary) hypertension: Secondary | ICD-10-CM | POA: Diagnosis not present

## 2019-07-07 DIAGNOSIS — Z87891 Personal history of nicotine dependence: Secondary | ICD-10-CM | POA: Insufficient documentation

## 2019-07-07 DIAGNOSIS — Z950 Presence of cardiac pacemaker: Secondary | ICD-10-CM | POA: Diagnosis not present

## 2019-07-07 DIAGNOSIS — T85698A Other mechanical complication of other specified internal prosthetic devices, implants and grafts, initial encounter: Secondary | ICD-10-CM | POA: Diagnosis not present

## 2019-07-07 DIAGNOSIS — E119 Type 2 diabetes mellitus without complications: Secondary | ICD-10-CM | POA: Diagnosis not present

## 2019-07-07 DIAGNOSIS — Z8673 Personal history of transient ischemic attack (TIA), and cerebral infarction without residual deficits: Secondary | ICD-10-CM | POA: Diagnosis not present

## 2019-07-07 DIAGNOSIS — R079 Chest pain, unspecified: Secondary | ICD-10-CM | POA: Diagnosis not present

## 2019-07-07 LAB — CBC WITH DIFFERENTIAL/PLATELET
Abs Immature Granulocytes: 0.03 10*3/uL (ref 0.00–0.07)
Basophils Absolute: 0 10*3/uL (ref 0.0–0.1)
Basophils Relative: 0 %
Eosinophils Absolute: 0.1 10*3/uL (ref 0.0–0.5)
Eosinophils Relative: 2 %
HCT: 41.9 % (ref 39.0–52.0)
Hemoglobin: 13.3 g/dL (ref 13.0–17.0)
Immature Granulocytes: 1 %
Lymphocytes Relative: 14 %
Lymphs Abs: 0.7 10*3/uL (ref 0.7–4.0)
MCH: 31.2 pg (ref 26.0–34.0)
MCHC: 31.7 g/dL (ref 30.0–36.0)
MCV: 98.4 fL (ref 80.0–100.0)
Monocytes Absolute: 0.5 10*3/uL (ref 0.1–1.0)
Monocytes Relative: 9 %
Neutro Abs: 3.8 10*3/uL (ref 1.7–7.7)
Neutrophils Relative %: 74 %
Platelets: 82 10*3/uL — ABNORMAL LOW (ref 150–400)
RBC: 4.26 MIL/uL (ref 4.22–5.81)
RDW: 16.4 % — ABNORMAL HIGH (ref 11.5–15.5)
WBC: 5.1 10*3/uL (ref 4.0–10.5)
nRBC: 0 % (ref 0.0–0.2)

## 2019-07-07 LAB — BASIC METABOLIC PANEL
Anion gap: 9 (ref 5–15)
BUN: 14 mg/dL (ref 8–23)
CO2: 28 mmol/L (ref 22–32)
Calcium: 9.3 mg/dL (ref 8.9–10.3)
Chloride: 108 mmol/L (ref 98–111)
Creatinine, Ser: 1.09 mg/dL (ref 0.61–1.24)
GFR calc Af Amer: >60 mL/min (ref >60)
GFR calc non Af Amer: >60 mL/min (ref >60)
Glucose, Bld: 142 mg/dL — ABNORMAL HIGH (ref 70–99)
Potassium: 4.4 mmol/L (ref 3.5–5.1)
Sodium: 145 mmol/L (ref 135–145)

## 2019-07-07 LAB — TROPONIN I (HIGH SENSITIVITY)
Troponin I (High Sensitivity): 14 ng/L (ref <18)
Troponin I (High Sensitivity): 16 ng/L (ref <18)

## 2019-07-07 LAB — CBG MONITORING, ED: Glucose-Capillary: 128 mg/dL — ABNORMAL HIGH (ref 70–99)
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
## 2019-07-07 NOTE — ED Notes (Signed)
Patient verbalizes understanding of discharge instructions. Opportunity for questioning and answers were provided. Armband removed by staff, pt discharged from ED via wheelchair to home.  

## 2019-07-07 NOTE — Discharge Instructions (Signed)
You were seen in the emergency department today for evaluation of chest pain last night.  Your heart labs, x-ray, pacemaker interrogation were are all normal.  Please call your cardiologist today to make them aware of your symptoms and schedule a follow-up appointment.  Return to the emergency department immediately with any new or suddenly worsening chest pain symptoms.

## 2019-07-07 NOTE — ED Triage Notes (Signed)
Patient arrived from home with reports of chest discomfort last nigh. He says he called his daughter this morning, who asked him to call the ambulance  PT denies pain this morning Reports he has a pacemaker, stating "I think they's gonna shock it tomorrow."

## 2019-07-15 ENCOUNTER — Other Ambulatory Visit: Payer: Self-pay | Admitting: Nurse Practitioner

## 2019-07-15 DIAGNOSIS — E1142 Type 2 diabetes mellitus with diabetic polyneuropathy: Secondary | ICD-10-CM

## 2019-07-15 DIAGNOSIS — K219 Gastro-esophageal reflux disease without esophagitis: Secondary | ICD-10-CM

## 2019-07-19 ENCOUNTER — Other Ambulatory Visit: Payer: Self-pay

## 2019-07-19 ENCOUNTER — Ambulatory Visit (INDEPENDENT_AMBULATORY_CARE_PROVIDER_SITE_OTHER): Payer: Medicare Other

## 2019-07-19 ENCOUNTER — Encounter: Payer: Self-pay | Admitting: Family Medicine

## 2019-07-19 ENCOUNTER — Ambulatory Visit (INDEPENDENT_AMBULATORY_CARE_PROVIDER_SITE_OTHER): Payer: Medicare Other | Admitting: Family Medicine

## 2019-07-19 VITALS — BP 137/64 | HR 65 | Temp 98.2°F | Resp 20 | Ht 70.0 in | Wt 215.0 lb

## 2019-07-19 DIAGNOSIS — S81811A Laceration without foreign body, right lower leg, initial encounter: Secondary | ICD-10-CM | POA: Diagnosis not present

## 2019-07-19 DIAGNOSIS — R0600 Dyspnea, unspecified: Secondary | ICD-10-CM

## 2019-07-19 DIAGNOSIS — S81812A Laceration without foreign body, left lower leg, initial encounter: Secondary | ICD-10-CM

## 2019-07-19 DIAGNOSIS — R0609 Other forms of dyspnea: Secondary | ICD-10-CM

## 2019-07-19 DIAGNOSIS — L03115 Cellulitis of right lower limb: Secondary | ICD-10-CM | POA: Diagnosis not present

## 2019-07-19 DIAGNOSIS — Z23 Encounter for immunization: Secondary | ICD-10-CM | POA: Diagnosis not present

## 2019-07-19 DIAGNOSIS — J90 Pleural effusion, not elsewhere classified: Secondary | ICD-10-CM | POA: Diagnosis not present

## 2019-07-19 DIAGNOSIS — R600 Localized edema: Secondary | ICD-10-CM

## 2019-07-19 MED ORDER — DOXYCYCLINE HYCLATE 100 MG PO TABS: 100.0000 mg | ORAL_TABLET | Freq: Two times a day (BID) | ORAL | 0 refills | Status: AC

## 2019-07-19 MED ORDER — FUROSEMIDE 20 MG PO TABS: 20.0000 mg | ORAL_TABLET | Freq: Every day | ORAL | 3 refills | Status: DC
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
## 2019-07-19 NOTE — Patient Instructions (Signed)
Edema  Edema is when you have too much fluid in your body or under your skin. Edema may make your legs, feet, and ankles swell up. Swelling is also common in looser tissues, like around your eyes. This is a common condition. It gets more common as you get older. There are many possible causes of edema. Eating too much salt (sodium) and being on your feet or sitting for a long time can cause edema in your legs, feet, and ankles. Hot weather may make edema worse. Edema is usually painless. Your skin may look swollen or shiny. Follow these instructions at home:  Keep the swollen body part raised (elevated) above the level of your heart when you are sitting or lying down.  Do not sit still or stand for a long time.  Do not wear tight clothes. Do not wear garters on your upper legs.  Exercise your legs. This can help the swelling go down.  Wear elastic bandages or support stockings as told by your doctor.  Eat a low-salt (low-sodium) diet to reduce fluid as told by your doctor.  Depending on the cause of your swelling, you may need to limit how much fluid you drink (fluid restriction).  Take over-the-counter and prescription medicines only as told by your doctor. Contact a doctor if:  Treatment is not working.  You have heart, liver, or kidney disease and have symptoms of edema.  You have sudden and unexplained weight gain. Get help right away if:  You have shortness of breath or chest pain.  You cannot breathe when you lie down.  You have pain, redness, or warmth in the swollen areas.  You have heart, liver, or kidney disease and get edema all of a sudden.  You have a fever and your symptoms get worse all of a sudden. Summary  Edema is when you have too much fluid in your body or under your skin.  Edema may make your legs, feet, and ankles swell up. Swelling is also common in looser tissues, like around your eyes.  Raise (elevate) the swollen body part above the level of your  heart when you are sitting or lying down.  Follow your doctor's instructions about diet and how much fluid you can drink (fluid restriction). This information is not intended to replace advice given to you by your health care provider. Make sure you discuss any questions you have with your health care provider. Document Released: 01/28/2008 Document Revised: 08/14/2017 Document Reviewed: 08/29/2016 Elsevier Patient Education  2020 Elsevier Inc.  

## 2019-07-20 ENCOUNTER — Telehealth: Payer: Self-pay | Admitting: *Deleted

## 2019-07-20 ENCOUNTER — Telehealth: Payer: Self-pay | Admitting: Cardiology

## 2019-07-20 ENCOUNTER — Encounter: Payer: Self-pay | Admitting: Internal Medicine

## 2019-07-20 ENCOUNTER — Ambulatory Visit (INDEPENDENT_AMBULATORY_CARE_PROVIDER_SITE_OTHER): Payer: Medicare Other | Admitting: Internal Medicine

## 2019-07-20 ENCOUNTER — Telehealth: Payer: Self-pay | Admitting: Internal Medicine

## 2019-07-20 VITALS — BP 130/62 | HR 70 | Ht 70.0 in | Wt 216.0 lb

## 2019-07-20 DIAGNOSIS — I4430 Unspecified atrioventricular block: Secondary | ICD-10-CM | POA: Diagnosis not present

## 2019-07-20 DIAGNOSIS — I480 Paroxysmal atrial fibrillation: Secondary | ICD-10-CM

## 2019-07-20 NOTE — Telephone Encounter (Signed)
Surgical center has already called to [facility] and we explained that Dr Harl Bowie was out of office and was being covered by Dr Tommie Ard and was given [place] fax/phone number to send clearance to [facility] office since that's where covering provider is today

## 2019-07-20 NOTE — Telephone Encounter (Signed)
Hector Shade from the [facility] left a message looking for this patients surgical clearance from Dr. Harl Bowie, pt is scheduled to have surgery on Monday 07/25/2019-- they will be closed tomorrow and Friday so she's looking to see if it can be faxed today.  Please give Herbert Spires a call @ [phone] ext [phone]   or fax clearance to [phone]

## 2019-07-20 NOTE — Progress Notes (Signed)
I have faxed clearance note from Dr. [name] to Dr. Katy Fitch with [facility].

## 2019-07-20 NOTE — Patient Instructions (Signed)
Medication Instructions:  Continue all current medications.  Labwork: none  Testing/Procedures: none  Follow-Up: 1 year   Any Other Special Instructions Will Be Listed Below (If Applicable).  If you need a refill on your cardiac medications before your next appointment, please call your pharmacy.  

## 2019-07-20 NOTE — Telephone Encounter (Signed)
Patient's daughter [name] wanted to talk to Dr. Harl Bowie about her father being placed on Lasix thru his PCP. Please call [phone] [name]

## 2019-07-20 NOTE — Telephone Encounter (Signed)
   [facility] Pre-operative Risk Assessment    Request for surgical clearance: PT APPT TODAY WITH DR. ALLRED  1. What type of surgery is being performed? CATARACT EXTRACTION OS w/IOL   2. When is this surgery scheduled? 07/25/19   3. What type of clearance is required (medical clearance vs. Pharmacy clearance to hold med vs. Both)? MEDICAL  4. Are there any medications that need to be held prior to surgery and how long? NO MEDICATION NEED TO BE HELD PER DR. [facility]   5. Practice name and name of physician performing surgery? [facility]; DR. Katy Fitch   6. What is your office phone number [phone]    7.   What is your office fax number [phone]  8.   Anesthesia type (None, local, MAC, general) ? MAC   [name] 07/20/2019, 3:14 PM  _________________________________________________________________   (provider comments below)
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
## 2019-07-27 ENCOUNTER — Telehealth: Payer: Self-pay | Admitting: Family Medicine

## 2019-07-27 NOTE — Telephone Encounter (Signed)
Aware. 

## 2019-07-27 NOTE — Telephone Encounter (Signed)
Placed up front to pick up

## 2019-08-02 NOTE — Telephone Encounter (Signed)
Per daughter - was started on Lasix 20mg  by his pmd.  Informed daughter that Dr. Harl Bowie would let that pmd follow since he prescribed.  If pmd feels he needs sooner f/u with cardiology, you may call the office to notify.  Daughter verbalized understanding.    Decided not to have the cataract surgery for now.

## 2019-08-02 NOTE — Telephone Encounter (Signed)
Has f/u scheduled to see his pmd on 08/05/2019 & Dr. Harl Bowie on 08/29/2019.

## 2019-08-04 ENCOUNTER — Other Ambulatory Visit: Payer: Self-pay

## 2019-08-05 ENCOUNTER — Ambulatory Visit (INDEPENDENT_AMBULATORY_CARE_PROVIDER_SITE_OTHER): Payer: Medicare Other | Admitting: Family Medicine

## 2019-08-05 ENCOUNTER — Encounter: Payer: Self-pay | Admitting: Family Medicine

## 2019-08-05 VITALS — BP 109/60 | HR 68 | Temp 97.4°F | Ht 70.0 in | Wt 205.0 lb

## 2019-08-05 DIAGNOSIS — M62838 Other muscle spasm: Secondary | ICD-10-CM

## 2019-08-05 DIAGNOSIS — L309 Dermatitis, unspecified: Secondary | ICD-10-CM | POA: Diagnosis not present

## 2019-08-05 DIAGNOSIS — I110 Hypertensive heart disease with heart failure: Secondary | ICD-10-CM

## 2019-08-05 MED ORDER — TRIAMCINOLONE ACETONIDE 0.1 % EX CREA: 1.0000 "application " | TOPICAL_CREAM | Freq: Two times a day (BID) | CUTANEOUS | 0 refills | Status: AC
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
## 2019-08-05 NOTE — Patient Instructions (Addendum)
Use lasix (furosemide) ONLY if needed as below.  Would not recommend daily use unless recommended by his cardiologist.  If he gains more than 3 pounds in 1 day, I want him to start using the Lasix once daily until his weight goes back to his goal weight of 203 pounds. (Should not be more than a couple of days needed).  If no improvement in weight after 48 hours on Lasix, contact the office for further instructions.  I would recommend using the compression hose daily.   Heart Failure Action Plan A heart failure action plan helps you understand what to do when you have symptoms of heart failure. Follow the plan that was created by you and your health care provider. Review your plan each time you visit your health care provider. Red zone These signs and symptoms mean you should get medical help right away:  You have trouble breathing when resting.  You have a dry cough that is getting worse.  You have swelling or pain in your legs or abdomen that is getting worse.  You suddenly gain more than 2-3 lb (0.9-1.4 kg) in a day, or more than 5 lb (2.3 kg) in one week. This amount may be more or less depending on your condition.  You have trouble staying awake or you feel confused.  You have chest pain.  You do not have an appetite.  You pass out. If you experience any of these symptoms:  Call your local emergency services (911 in the [place] right away or seek help at the emergency department of the nearest hospital. Yellow zone These signs and symptoms mean your condition may be getting worse and you should make some changes:  You have trouble breathing when you are active or you need to sleep with extra pillows.  You have swelling in your legs or abdomen.  You gain 2-3 lb (0.9-1.4 kg) in one day, or 5 lb (2.3 kg) in one week. This amount may be more or less depending on your condition.  You get tired easily.  You have trouble sleeping.  You have a dry cough. If you experience any of  these symptoms:  Contact your health care provider within the next day.  Your health care provider may adjust your medicines. Green zone These signs mean you are doing well and can continue what you are doing:  You do not have shortness of breath.  You have very little swelling or no new swelling.  Your weight is stable (no gain or loss).  You have a normal activity level.  You do not have chest pain or any other new symptoms. Follow these instructions at home:  Take over-the-counter and prescription medicines only as told by your health care provider.  Weigh yourself daily. Your target weight is _203___ lb (__________ kg). ? Call your health care provider if you gain more than ___3____ lb (__________ kg) in a day, or more than ____5___ lb (__________ kg) in one week.  Eat a heart-healthy diet. Work with a diet and nutrition specialist (dietitian) to create an eating plan that is best for you.  Keep all follow-up visits as told by your health care provider. This is important. Where to find more information  American Heart Association: www.heart.org Summary  Follow the action plan that was created by you and your health care provider.  Get help right away if you have any symptoms in the Red zone. This information is not intended to replace advice given to you by  your health care provider. Make sure you discuss any questions you have with your health care provider. Document Released: 09/20/2016 Document Revised: 07/24/2017 Document Reviewed: 09/20/2016 Elsevier Patient Education  2020 [place] Your skin plays an important role in keeping the entire body healthy.  Below are some tips on how to try and maximize skin health from the outside in.  1) Bathe in mildly warm water every 1 to 3 days, followed by light drying and an application of a thick moisturizer cream or ointment, preferably one that comes in a tub. a. Fragrance free moisturizing bars or body  washes are preferred such as Purpose, Cetaphil, Dove sensitive skin, Aveeno, [employer] or Vanicream products. b. Use a fragrance free cream or ointment, not a lotion, such as plain petroleum jelly or Vaseline ointment, Aquaphor, Vanicream, Eucerin cream or a generic version, CeraVe Cream, Cetaphil Restoraderm, Aveeno Eczema Therapy and [employer], among others. c. Children with very dry skin often need to put on these creams two, three or four times a day.  As much as possible, use these creams enough to keep the skin from looking dry. d. Consider using fragrance free/dye free detergent, such as Arm and Hammer for sensitive skin, Tide Free or All Free.   2) If I am prescribing a medication to go on the skin, the medicine goes on first to the areas that need it, followed by a thick cream as above to the entire body.  3) Nancy Fetter is a major cause of damage to the skin. a. I recommend sun protection for all of my patients. I prefer physical barriers such as hats with wide brims that cover the ears, long sleeve clothing with SPF protection including rash guards for swimming. These can be found seasonally at outdoor clothing companies, [employer] and [employer] and online at [employer]in.com, www.uvskinz.com and PlayDetails.hu. Avoid peak sun between the hours of 10am to 3pm to minimize sun exposure.  b. I recommend sunscreen for all of my patients older than 47 months of age when in the sun, preferably with broad spectrum coverage and SPF 30 or higher.  i. For children, I recommend sunscreens that only contain titanium dioxide and/or zinc oxide in the active ingredients. These do not burn the eyes and appear to be safer than chemical sunscreens. These sunscreens include zinc oxide paste found in the diaper section, Vanicream Broad Spectrum 50+, Aveeno Natural Mineral Protection, Neutrogena Pure and Free Baby, Johnson [employer] Daily face and body lotion, Bed Bath & Beyond, among  others. ii. There is no such thing as waterproof sunscreen. All sunscreens should be reapplied after 60-80 minutes of wear.  iii. Spray on sunscreens often use chemical sunscreens which do protect against the sun. However, these can be difficult to apply correctly, especially if wind is present, and can be more likely to irritate the skin.  Long term effects of chemical sunscreens are also not fully known.

## 2019-08-06 LAB — CBC
Hematocrit: 42 % (ref 37.5–51.0)
Hemoglobin: 14.4 g/dL (ref 13.0–17.7)
MCH: 32.4 pg (ref 26.6–33.0)
MCHC: 34.3 g/dL (ref 31.5–35.7)
MCV: 94 fL (ref 79–97)
Platelets: 83 10*3/uL — CL (ref 150–450)
RBC: 4.45 x10E6/uL (ref 4.14–5.80)
RDW: 15.8 % — ABNORMAL HIGH (ref 11.6–15.4)
WBC: 5.6 10*3/uL (ref 3.4–10.8)

## 2019-08-06 LAB — BASIC METABOLIC PANEL
BUN/Creatinine Ratio: 19 (ref 10–24)
BUN: 19 mg/dL (ref 8–27)
CO2: 27 mmol/L (ref 20–29)
Calcium: 9.7 mg/dL (ref 8.6–10.2)
Chloride: 104 mmol/L (ref 96–106)
Creatinine, Ser: 1 mg/dL (ref 0.76–1.27)
GFR calc Af Amer: 80 mL/min/{1.73_m2} (ref >59)
GFR calc non Af Amer: 69 mL/min/{1.73_m2} (ref >59)
Glucose: 89 mg/dL (ref 65–99)
Potassium: 4.5 mmol/L (ref 3.5–5.2)
Sodium: 144 mmol/L (ref 134–144)

## 2019-08-23 ENCOUNTER — Ambulatory Visit (INDEPENDENT_AMBULATORY_CARE_PROVIDER_SITE_OTHER): Payer: Medicare Other | Admitting: *Deleted

## 2019-08-23 DIAGNOSIS — I480 Paroxysmal atrial fibrillation: Secondary | ICD-10-CM | POA: Diagnosis not present

## 2019-08-23 LAB — CUP PACEART REMOTE DEVICE CHECK
Battery Remaining Longevity: 103 mo
Battery Remaining Percentage: 95.5 %
Battery Voltage: 3.01 V
Brady Statistic AP VP Percent: 51 %
Brady Statistic AP VS Percent: 1 %
Brady Statistic AS VP Percent: 43 %
Brady Statistic AS VS Percent: 1 %
Brady Statistic RA Percent Paced: 45 %
Brady Statistic RV Percent Paced: 94 %
Date Time Interrogation Session: 20201229023204
Implantable Lead Implant Date: 20200630
Implantable Lead Implant Date: 20200630
Implantable Lead Location: 753859
Implantable Lead Location: 753860
Implantable Pulse Generator Implant Date: 20200630
Lead Channel Impedance Value: 430 Ohm
Lead Channel Impedance Value: 680 Ohm
Lead Channel Pacing Threshold Amplitude: 0.5 V
Lead Channel Pacing Threshold Amplitude: 0.75 V
Lead Channel Pacing Threshold Pulse Width: 0.5 ms
Lead Channel Pacing Threshold Pulse Width: 0.5 ms
Lead Channel Sensing Intrinsic Amplitude: 0.6 mV
Lead Channel Sensing Intrinsic Amplitude: 12 mV
Lead Channel Setting Pacing Amplitude: 2 V
Lead Channel Setting Pacing Amplitude: 2.5 V
Lead Channel Setting Pacing Pulse Width: 0.5 ms
Lead Channel Setting Sensing Sensitivity: 2 mV
Pulse Gen Model: 2272
Pulse Gen Serial Number: 3311958

## 2019-08-28 ENCOUNTER — Other Ambulatory Visit: Payer: Self-pay | Admitting: Family Medicine

## 2019-08-28 ENCOUNTER — Other Ambulatory Visit: Payer: Self-pay | Admitting: Nurse Practitioner

## 2019-08-28 DIAGNOSIS — I110 Hypertensive heart disease with heart failure: Secondary | ICD-10-CM

## 2019-08-28 DIAGNOSIS — E785 Hyperlipidemia, unspecified: Secondary | ICD-10-CM

## 2019-08-29 ENCOUNTER — Ambulatory Visit: Payer: Medicare Other | Admitting: Cardiology

## 2019-09-04 ENCOUNTER — Other Ambulatory Visit: Payer: Self-pay | Admitting: Family Medicine

## 2019-09-04 DIAGNOSIS — N3946 Mixed incontinence: Secondary | ICD-10-CM

## 2019-09-29 ENCOUNTER — Other Ambulatory Visit: Payer: Self-pay | Admitting: *Deleted

## 2019-09-29 MED ORDER — APIXABAN 5 MG PO TABS: 5.0000 mg | ORAL_TABLET | Freq: Two times a day (BID) | ORAL | 0 refills | Status: DC

## 2019-09-30 ENCOUNTER — Telehealth: Payer: Self-pay

## 2019-09-30 ENCOUNTER — Encounter: Payer: Self-pay | Admitting: Cardiology

## 2019-09-30 ENCOUNTER — Ambulatory Visit: Payer: Medicare Other | Admitting: Cardiology

## 2019-09-30 VITALS — BP 134/69 | HR 68 | Ht 70.0 in | Wt 212.4 lb

## 2019-09-30 DIAGNOSIS — I10 Essential (primary) hypertension: Secondary | ICD-10-CM | POA: Diagnosis not present

## 2019-09-30 DIAGNOSIS — I2510 Atherosclerotic heart disease of native coronary artery without angina pectoris: Secondary | ICD-10-CM

## 2019-09-30 DIAGNOSIS — R001 Bradycardia, unspecified: Secondary | ICD-10-CM

## 2019-09-30 DIAGNOSIS — I480 Paroxysmal atrial fibrillation: Secondary | ICD-10-CM
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
## 2019-09-30 NOTE — Telephone Encounter (Signed)
Remote transmission scheduled for 2/8.  Spoke with pt daughter to advise that transmission will occur.

## 2019-09-30 NOTE — Patient Instructions (Signed)
Your physician wants you to follow-up in: [place] will receive a reminder letter in the mail two months in advance. If you don't receive a letter, please call our office to schedule the follow-up appointment.  Your physician recommends that you continue on your current medications as directed. Please refer to the Current Medication list given to you today.  WE WILL CONTACT DEVICE CLINIC   Thank you for choosing [facility]!!

## 2019-09-30 NOTE — Telephone Encounter (Signed)
-----   Message from [name], CMA sent at 09/30/2019  4:22 PM EST ----- This pt was seen by Dr Harl Bowie today and he wants pt to have a remote check on his device next week he has been having some dizziness, Thank you  [facility]

## 2019-10-03 ENCOUNTER — Ambulatory Visit (INDEPENDENT_AMBULATORY_CARE_PROVIDER_SITE_OTHER): Payer: Medicare Other | Admitting: *Deleted

## 2019-10-03 DIAGNOSIS — I480 Paroxysmal atrial fibrillation: Secondary | ICD-10-CM | POA: Diagnosis not present

## 2019-10-03 NOTE — Telephone Encounter (Signed)
Transmission not received automatically (needs to be scheduled 2 weeks in advance via [place]). Spoke with Abigail Butts, she is at work and not able to send a transmission. Requests that I call Denisea, her sister (pt's daughter). Spoke with Denisea, who is not able to transmit right now. She agrees to call direct DC phone number back tomorrow for assistance. No additional questions at this time.

## 2019-10-04 ENCOUNTER — Telehealth: Payer: Self-pay

## 2019-10-04 NOTE — Telephone Encounter (Signed)
See phone note from 09/30/19. Transmission data routed to Dr. Harl Bowie for review as requested.

## 2019-10-04 NOTE — Telephone Encounter (Signed)
The pt daughter Langley Gauss called for help sending a transmission. Transmission received. The pt states he sleeps 8 ft away from the monitor. I let the pt know he needs to sleep within 4-6 ft of his home monitor for it to send automatically. I let them know the nurse will review the transmission and if it looks normal the nurse will not call but if they see anything someone will call him back. Denise verbalized understanding.

## 2019-10-04 NOTE — Telephone Encounter (Signed)
Transmission received 10/04/19 at 08:43. Normal PPM function. Presenting rhythm AP/VP 60s w/occasional PVCs. AP 49%, VP 95%. Device estimates PVC burden at 3.2%. 1.9% AT/AF burden, no episodes since 07/2019. Known PAF, on Eliquis. No high ventricular rates.  Routed to Dr. Harl Bowie as requested.

## 2019-10-05 LAB — CUP PACEART REMOTE DEVICE CHECK
Battery Remaining Longevity: 103 mo
Battery Remaining Longevity: 103 mo
Battery Remaining Percentage: 95.5 %
Battery Remaining Percentage: 95.5 %
Battery Voltage: 3.01 V
Battery Voltage: 3.01 V
Brady Statistic AP VP Percent: 53 %
Brady Statistic AP VP Percent: 53 %
Brady Statistic AP VS Percent: 1 %
Brady Statistic AP VS Percent: 1 %
Brady Statistic AS VP Percent: 41 %
Brady Statistic AS VP Percent: 41 %
Brady Statistic AS VS Percent: 1 %
Brady Statistic AS VS Percent: 1 %
Brady Statistic RA Percent Paced: 49 %
Brady Statistic RA Percent Paced: 49 %
Brady Statistic RV Percent Paced: 95 %
Brady Statistic RV Percent Paced: 95 %
Date Time Interrogation Session: 20210209084316
Date Time Interrogation Session: 20210209084316
Implantable Lead Implant Date: 20200630
Implantable Lead Implant Date: 20200630
Implantable Lead Implant Date: 20200630
Implantable Lead Implant Date: 20200630
Implantable Lead Location: 753859
Implantable Lead Location: 753859
Implantable Lead Location: 753860
Implantable Lead Location: 753860
Implantable Pulse Generator Implant Date: 20200630
Implantable Pulse Generator Implant Date: 20200630
Lead Channel Impedance Value: 450 Ohm
Lead Channel Impedance Value: 450 Ohm
Lead Channel Impedance Value: 710 Ohm
Lead Channel Impedance Value: 710 Ohm
Lead Channel Pacing Threshold Amplitude: 0.5 V
Lead Channel Pacing Threshold Amplitude: 0.5 V
Lead Channel Pacing Threshold Amplitude: 0.75 V
Lead Channel Pacing Threshold Amplitude: 0.75 V
Lead Channel Pacing Threshold Pulse Width: 0.5 ms
Lead Channel Pacing Threshold Pulse Width: 0.5 ms
Lead Channel Pacing Threshold Pulse Width: 0.5 ms
Lead Channel Pacing Threshold Pulse Width: 0.5 ms
Lead Channel Sensing Intrinsic Amplitude: 0.5 mV
Lead Channel Sensing Intrinsic Amplitude: 0.5 mV
Lead Channel Sensing Intrinsic Amplitude: 12 mV
Lead Channel Sensing Intrinsic Amplitude: 12 mV
Lead Channel Setting Pacing Amplitude: 2 V
Lead Channel Setting Pacing Amplitude: 2 V
Lead Channel Setting Pacing Amplitude: 2.5 V
Lead Channel Setting Pacing Amplitude: 2.5 V
Lead Channel Setting Pacing Pulse Width: 0.5 ms
Lead Channel Setting Pacing Pulse Width: 0.5 ms
Lead Channel Setting Sensing Sensitivity: 2 mV
Lead Channel Setting Sensing Sensitivity: 2 mV
Pulse Gen Model: 2272
Pulse Gen Model: 2272
Pulse Gen Serial Number: 3311958
Pulse Gen Serial Number: 3311958

## 2019-10-07 ENCOUNTER — Other Ambulatory Visit: Payer: Self-pay

## 2019-10-10 ENCOUNTER — Ambulatory Visit: Payer: Medicare Other | Admitting: Family Medicine

## 2019-10-12 ENCOUNTER — Other Ambulatory Visit: Payer: Self-pay | Admitting: Family Medicine

## 2019-10-12 DIAGNOSIS — R0600 Dyspnea, unspecified: Secondary | ICD-10-CM

## 2019-10-12 DIAGNOSIS — R600 Localized edema: Secondary | ICD-10-CM

## 2019-10-12 DIAGNOSIS — R0609 Other forms of dyspnea: Secondary | ICD-10-CM

## 2019-10-26 ENCOUNTER — Other Ambulatory Visit: Payer: Self-pay

## 2019-10-26 ENCOUNTER — Other Ambulatory Visit: Payer: Self-pay | Admitting: Cardiology

## 2019-10-26 ENCOUNTER — Ambulatory Visit (INDEPENDENT_AMBULATORY_CARE_PROVIDER_SITE_OTHER): Payer: Medicare Other | Admitting: Family Medicine

## 2019-10-26 VITALS — BP 130/67 | HR 73 | Temp 98.0°F | Ht 70.0 in | Wt 213.0 lb

## 2019-10-26 DIAGNOSIS — D696 Thrombocytopenia, unspecified: Secondary | ICD-10-CM

## 2019-10-26 DIAGNOSIS — Z7952 Long term (current) use of systemic steroids: Secondary | ICD-10-CM

## 2019-10-26 DIAGNOSIS — E1169 Type 2 diabetes mellitus with other specified complication: Secondary | ICD-10-CM

## 2019-10-26 DIAGNOSIS — J449 Chronic obstructive pulmonary disease, unspecified: Secondary | ICD-10-CM

## 2019-10-26 DIAGNOSIS — E785 Hyperlipidemia, unspecified: Secondary | ICD-10-CM

## 2019-10-26 DIAGNOSIS — I110 Hypertensive heart disease with heart failure: Secondary | ICD-10-CM | POA: Diagnosis not present

## 2019-10-26 DIAGNOSIS — J4489 Other specified chronic obstructive pulmonary disease: Secondary | ICD-10-CM

## 2019-10-26 DIAGNOSIS — E1142 Type 2 diabetes mellitus with diabetic polyneuropathy: Secondary | ICD-10-CM | POA: Diagnosis not present

## 2019-10-26 DIAGNOSIS — R42 Dizziness and giddiness: Secondary | ICD-10-CM

## 2019-10-26 LAB — BAYER DCA HB A1C WAIVED: HB A1C (BAYER DCA - WAIVED): 6.2 % (ref <7.0)

## 2019-10-26 MED ORDER — PREDNISONE 20 MG PO TABS: 40.0000 mg | ORAL_TABLET | Freq: Every day | ORAL | 0 refills | Status: AC

## 2019-10-26 NOTE — Patient Instructions (Addendum)
Since his oxygen is a little on the low side and his lung exam sounds a little tight I am going to put him on prednisone for a few days.  Please make sure that he is using his Symbicort twice daily as directed.  For the next couple of days would like him to use his albuterol 2 puffs every 6 hours scheduled rather than as needed.  He may then go back to as needed use after 2 days.  If you can, I would like you to purchase a pulse oximeter to monitor his oxygen level.  The best way to monitoring oxygen levels to make sure that the hands are warm before applying the oximeter.  If you can call me with this level sometime this week or early next week that would be good.  If you are unable to get 1 of these prior to next week I would like to have him checked up here in office.  Sugar looks fantastic.  His A1c was 6.2 today.  Continue current regimen.  Make sure that he is taking his omeprazole daily to help with any reflux symptoms.  I put a referral into vestibular rehab for his inner ear.  You had labs performed today.  You will be contacted with the results of the labs once they are available, usually in the next 3 business days for routine lab work.  If you have an active my chart account, they will be released to your MyChart.  If you prefer to have these labs released to you via telephone, please let us know.  If you had a pap smear or biopsy performed, expect to be contacted in about 7-10 days.
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
## 2019-10-27 LAB — CMP14+EGFR
ALT: 15 IU/L (ref 0–44)
AST: 18 IU/L (ref 0–40)
Albumin/Globulin Ratio: 2.1 (ref 1.2–2.2)
Albumin: 3.9 g/dL (ref 3.6–4.6)
Alkaline Phosphatase: 91 IU/L (ref 39–117)
BUN/Creatinine Ratio: 10 (ref 10–24)
BUN: 11 mg/dL (ref 8–27)
Bilirubin Total: 0.9 mg/dL (ref 0.0–1.2)
CO2: 24 mmol/L (ref 20–29)
Calcium: 9.4 mg/dL (ref 8.6–10.2)
Chloride: 105 mmol/L (ref 96–106)
Creatinine, Ser: 1.05 mg/dL (ref 0.76–1.27)
GFR calc Af Amer: 75 mL/min/{1.73_m2} (ref >59)
GFR calc non Af Amer: 65 mL/min/{1.73_m2} (ref >59)
Globulin, Total: 1.9 g/dL (ref 1.5–4.5)
Glucose: 139 mg/dL — ABNORMAL HIGH (ref 65–99)
Potassium: 4.4 mmol/L (ref 3.5–5.2)
Sodium: 142 mmol/L (ref 134–144)
Total Protein: 5.8 g/dL — ABNORMAL LOW (ref 6.0–8.5)

## 2019-10-27 LAB — CBC
Hematocrit: 37 % — ABNORMAL LOW (ref 37.5–51.0)
Hemoglobin: 12.5 g/dL — ABNORMAL LOW (ref 13.0–17.7)
MCH: 31.6 pg (ref 26.6–33.0)
MCHC: 33.8 g/dL (ref 31.5–35.7)
MCV: 93 fL (ref 79–97)
Platelets: 85 10*3/uL — CL (ref 150–450)
RBC: 3.96 x10E6/uL — ABNORMAL LOW (ref 4.14–5.80)
RDW: 15.2 % (ref 11.6–15.4)
WBC: 4.5 10*3/uL (ref 3.4–10.8)

## 2019-10-27 LAB — VITAMIN D 25 HYDROXY (VIT D DEFICIENCY, FRACTURES): Vit D, 25-Hydroxy: 23.7 ng/mL — ABNORMAL LOW (ref 30.0–100.0)

## 2019-11-01 ENCOUNTER — Telehealth: Payer: Self-pay | Admitting: Family Medicine

## 2019-11-01 NOTE — Telephone Encounter (Signed)
Very good.  Keep me posted if things are not moving in the right direction.

## 2019-11-01 NOTE — Telephone Encounter (Signed)
[facility] AWARE

## 2019-11-03 ENCOUNTER — Ambulatory Visit (HOSPITAL_COMMUNITY): Payer: Medicare Other | Attending: Family Medicine | Admitting: Physical Therapy

## 2019-11-03 ENCOUNTER — Encounter (HOSPITAL_COMMUNITY): Payer: Self-pay | Admitting: Physical Therapy

## 2019-11-03 ENCOUNTER — Other Ambulatory Visit: Payer: Self-pay

## 2019-11-03 DIAGNOSIS — R42 Dizziness and giddiness: Secondary | ICD-10-CM | POA: Diagnosis not present

## 2019-11-03 DIAGNOSIS — Z9181 History of falling: Secondary | ICD-10-CM | POA: Diagnosis not present
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
## 2019-11-07 ENCOUNTER — Other Ambulatory Visit: Payer: Self-pay

## 2019-11-07 ENCOUNTER — Encounter (HOSPITAL_COMMUNITY): Payer: Self-pay | Admitting: Physical Therapy

## 2019-11-07 ENCOUNTER — Other Ambulatory Visit: Payer: Self-pay | Admitting: *Deleted

## 2019-11-07 ENCOUNTER — Ambulatory Visit (HOSPITAL_COMMUNITY): Payer: Medicare Other | Admitting: Physical Therapy

## 2019-11-07 DIAGNOSIS — R42 Dizziness and giddiness: Secondary | ICD-10-CM | POA: Diagnosis not present

## 2019-11-07 DIAGNOSIS — Z9181 History of falling: Secondary | ICD-10-CM | POA: Diagnosis not present

## 2019-11-07 MED ORDER — APIXABAN 5 MG PO TABS: 5.0000 mg | ORAL_TABLET | Freq: Two times a day (BID) | ORAL | 6 refills | Status: DC
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
## 2019-11-08 ENCOUNTER — Ambulatory Visit (INDEPENDENT_AMBULATORY_CARE_PROVIDER_SITE_OTHER): Payer: Medicare Other | Admitting: *Deleted

## 2019-11-08 DIAGNOSIS — Z0000 Encounter for general adult medical examination without abnormal findings: Secondary | ICD-10-CM
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
## 2019-11-10 ENCOUNTER — Ambulatory Visit (HOSPITAL_COMMUNITY): Payer: Medicare Other | Admitting: Physical Therapy

## 2019-11-10 ENCOUNTER — Encounter (HOSPITAL_COMMUNITY): Payer: Self-pay | Admitting: Physical Therapy

## 2019-11-10 ENCOUNTER — Other Ambulatory Visit: Payer: Self-pay

## 2019-11-10 DIAGNOSIS — R42 Dizziness and giddiness: Secondary | ICD-10-CM

## 2019-11-10 DIAGNOSIS — Z9181 History of falling: Secondary | ICD-10-CM | POA: Diagnosis not present
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
## 2019-11-14 IMAGING — DX DG CHEST 1V PORT
1 series · 1 of 1 positions shown · non-contrast
Comparison: Radiograph 05/20/2019

CLINICAL DATA: Patient arrived from home with reports of chest
discomfort last nigh. He says he called his daughter this morning,
who asked him to call the ambulance PT denies pain this morning
Reports he has a pacemaker, stating "I think the.*comment was
truncated*chest pain

EXAM:
PORTABLE CHEST 1 VIEW

[chest]
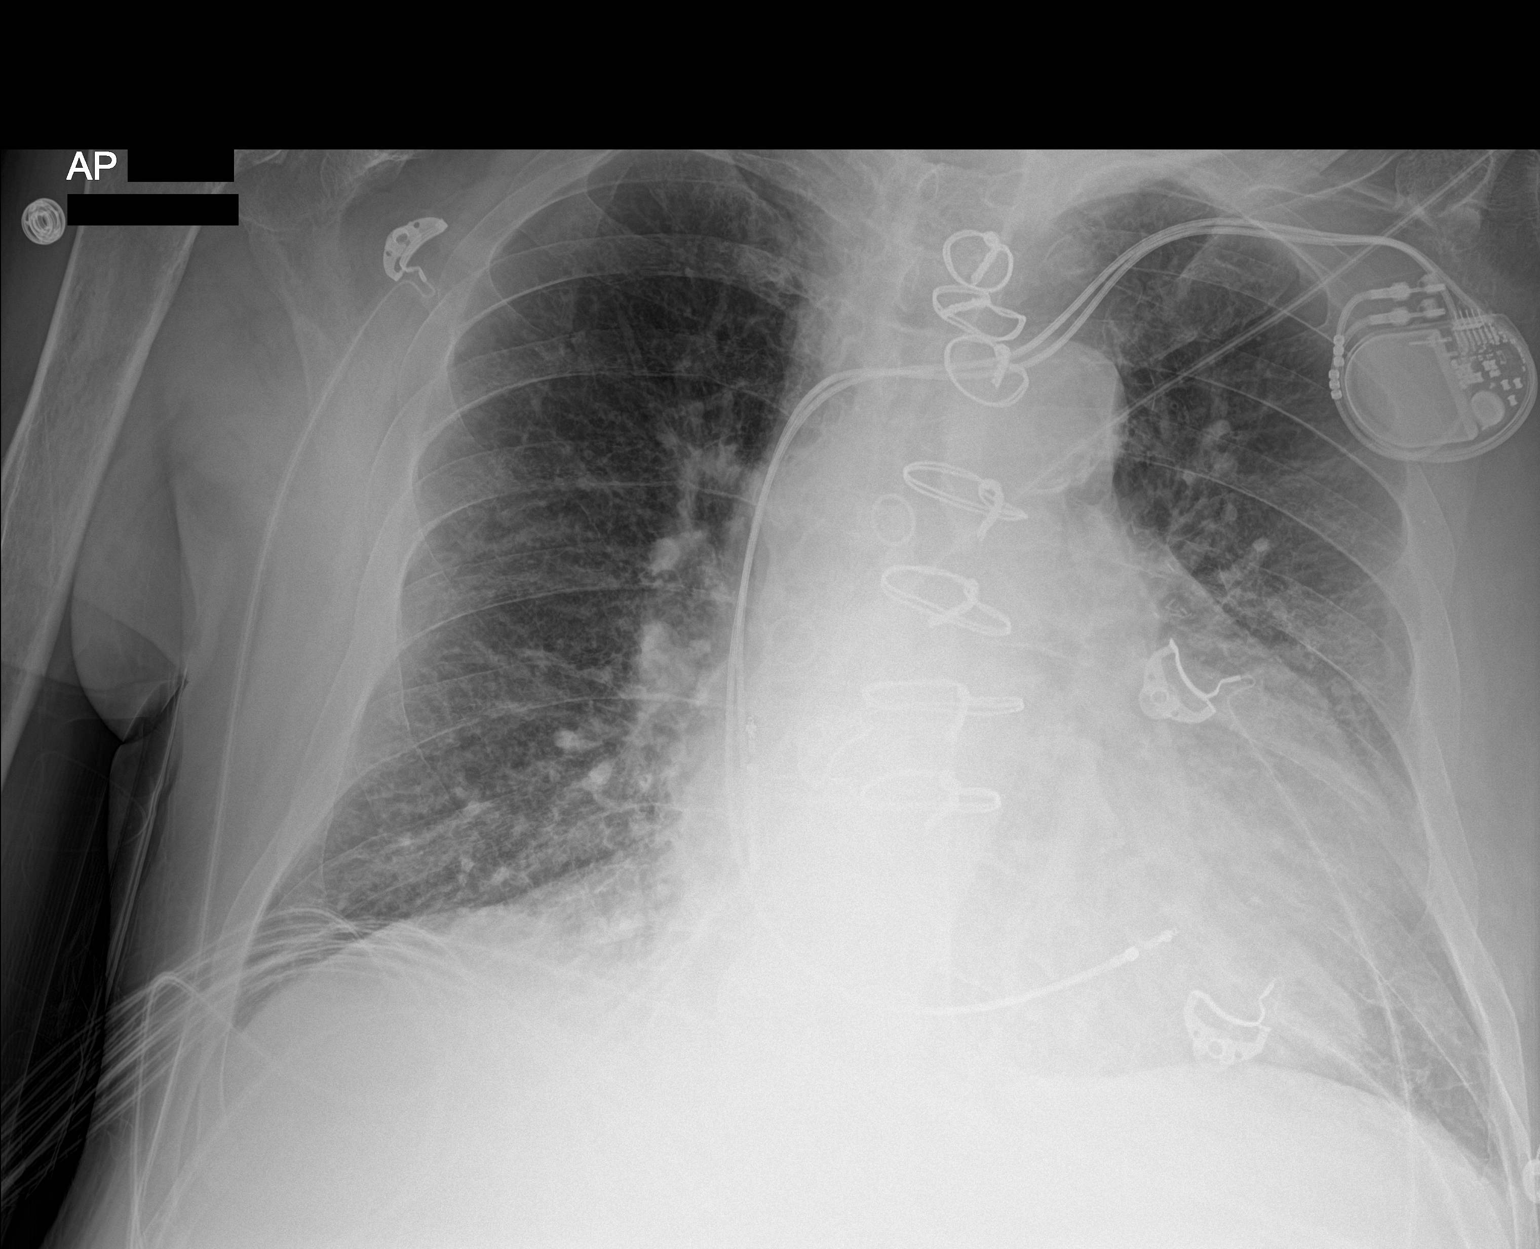

[1 of 1 positions shown; findings below may reference images not displayed]

FINDINGS: Sternotomy wires overlie enlarged cardiac silhouette. Central venous
pulmonary congestion. Cardiomegaly. No effusion, infiltrate, or
pneumothorax. No acute osseous abnormality.
IMPRESSION: Cardiomegaly and central venous congestion suggest mild congestive
heart failure

## 2019-11-17 ENCOUNTER — Other Ambulatory Visit: Payer: Self-pay

## 2019-11-17 ENCOUNTER — Ambulatory Visit (HOSPITAL_COMMUNITY): Payer: Medicare Other | Admitting: Physical Therapy

## 2019-11-17 ENCOUNTER — Other Ambulatory Visit: Payer: Self-pay | Admitting: Family Medicine

## 2019-11-17 ENCOUNTER — Encounter (HOSPITAL_COMMUNITY): Payer: Self-pay | Admitting: Physical Therapy

## 2019-11-17 DIAGNOSIS — Z9181 History of falling: Secondary | ICD-10-CM

## 2019-11-17 DIAGNOSIS — E785 Hyperlipidemia, unspecified: Secondary | ICD-10-CM

## 2019-11-17 DIAGNOSIS — R42 Dizziness and giddiness: Secondary | ICD-10-CM | POA: Diagnosis not present
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
## 2019-11-21 ENCOUNTER — Ambulatory Visit (HOSPITAL_COMMUNITY): Payer: Medicare Other | Admitting: Physical Therapy

## 2019-11-21 LAB — CUP PACEART REMOTE DEVICE CHECK
Battery Remaining Longevity: 103 mo
Battery Remaining Percentage: 95.5 %
Battery Voltage: 3.01 V
Brady Statistic AP VP Percent: 54 %
Brady Statistic AP VS Percent: 1 %
Brady Statistic AS VP Percent: 40 %
Brady Statistic AS VS Percent: 1 %
Brady Statistic RA Percent Paced: 48 %
Brady Statistic RV Percent Paced: 94 %
Date Time Interrogation Session: 20210329135657
Implantable Lead Implant Date: 20200630
Implantable Lead Implant Date: 20200630
Implantable Lead Location: 753859
Implantable Lead Location: 753860
Implantable Pulse Generator Implant Date: 20200630
Lead Channel Impedance Value: 460 Ohm
Lead Channel Impedance Value: 700 Ohm
Lead Channel Pacing Threshold Amplitude: 0.5 V
Lead Channel Pacing Threshold Amplitude: 0.75 V
Lead Channel Pacing Threshold Pulse Width: 0.5 ms
Lead Channel Pacing Threshold Pulse Width: 0.5 ms
Lead Channel Sensing Intrinsic Amplitude: 1 mV
Lead Channel Sensing Intrinsic Amplitude: 12 mV
Lead Channel Setting Pacing Amplitude: 2 V
Lead Channel Setting Pacing Amplitude: 2.5 V
Lead Channel Setting Pacing Pulse Width: 0.5 ms
Lead Channel Setting Sensing Sensitivity: 2 mV
Pulse Gen Model: 2272
Pulse Gen Serial Number: 3311958

## 2019-11-22 ENCOUNTER — Ambulatory Visit (INDEPENDENT_AMBULATORY_CARE_PROVIDER_SITE_OTHER): Payer: Medicare Other | Admitting: *Deleted

## 2019-11-22 DIAGNOSIS — I480 Paroxysmal atrial fibrillation: Secondary | ICD-10-CM

## 2019-11-22 NOTE — Progress Notes (Signed)
PPM Remote  

## 2019-11-23 ENCOUNTER — Telehealth (HOSPITAL_COMMUNITY): Payer: Self-pay | Admitting: Physical Therapy

## 2019-11-23 NOTE — Telephone Encounter (Signed)
pt daughter having a hard time convincing him that he needs therapy. They will try again next week, but are cancelling for tomorrow

## 2019-11-24 ENCOUNTER — Other Ambulatory Visit: Payer: Self-pay | Admitting: Family Medicine

## 2019-11-24 ENCOUNTER — Ambulatory Visit (HOSPITAL_COMMUNITY): Payer: Medicare Other | Admitting: Physical Therapy

## 2019-11-24 DIAGNOSIS — N3946 Mixed incontinence: Secondary | ICD-10-CM

## 2019-12-01 ENCOUNTER — Ambulatory Visit (HOSPITAL_COMMUNITY): Payer: Medicare Other | Admitting: Physical Therapy

## 2019-12-01 ENCOUNTER — Telehealth (HOSPITAL_COMMUNITY): Payer: Self-pay | Admitting: Physical Therapy

## 2019-12-01 NOTE — Telephone Encounter (Signed)
Cx by phone tree no reason given

## 2019-12-01 NOTE — Telephone Encounter (Signed)
Therapist called pt re no show.  No answer, left message to call department if they want to be discharged due to pt voicing that he did not need any further therapy last session.  Rayetta Humphrey, [place] CLT [phone]

## 2019-12-02 ENCOUNTER — Telehealth (HOSPITAL_COMMUNITY): Payer: Self-pay | Admitting: Physical Therapy

## 2019-12-02 NOTE — Telephone Encounter (Signed)
Daugher called said her father is not sure he wants to come back - they will let us know about Thursday

## 2019-12-05 ENCOUNTER — Ambulatory Visit (HOSPITAL_COMMUNITY): Payer: Medicare Other | Admitting: Physical Therapy

## 2019-12-07 ENCOUNTER — Telehealth (HOSPITAL_COMMUNITY): Payer: Self-pay | Admitting: Physical Therapy

## 2019-12-07 NOTE — Telephone Encounter (Signed)
pt cancelled appt for 4/15, no reason given

## 2019-12-08 ENCOUNTER — Ambulatory Visit (HOSPITAL_COMMUNITY): Payer: Medicare Other | Admitting: Physical Therapy

## 2019-12-12 ENCOUNTER — Other Ambulatory Visit: Payer: Self-pay | Admitting: *Deleted

## 2019-12-12 DIAGNOSIS — N3946 Mixed incontinence: Secondary | ICD-10-CM

## 2019-12-12 MED ORDER — TAMSULOSIN HCL 0.4 MG PO CAPS: ORAL_CAPSULE | ORAL | 0 refills | Status: DC

## 2019-12-19 ENCOUNTER — Other Ambulatory Visit (HOSPITAL_COMMUNITY): Payer: Self-pay | Admitting: Physician Assistant

## 2019-12-23 ENCOUNTER — Telehealth: Payer: Self-pay | Admitting: Family Medicine

## 2019-12-23 NOTE — Telephone Encounter (Signed)
Returned daughters call about patient - states patient states he is not supposed to take that "little white pill".  Daughter wants to make sure patient should be taking both amolodipine and metoprolol? Both medications are on medication list.  Looked at last lab and office visit and I do not see where it was d/c'd.  Please advise - covering pcp

## 2019-12-23 NOTE — Telephone Encounter (Signed)
Daughter aware and verbalizes understanding. 

## 2019-12-23 NOTE — Telephone Encounter (Signed)
Lmtcb.

## 2019-12-23 NOTE — Telephone Encounter (Signed)
According to Dr. Synthia Innocent last note he was taking this at his last visit and was told to continue all medications.

## 2020-01-31 ENCOUNTER — Ambulatory Visit: Payer: Medicare Other | Admitting: Family Medicine

## 2020-02-01 ENCOUNTER — Ambulatory Visit (INDEPENDENT_AMBULATORY_CARE_PROVIDER_SITE_OTHER): Payer: Medicare Other | Admitting: Family Medicine

## 2020-02-01 ENCOUNTER — Other Ambulatory Visit: Payer: Self-pay

## 2020-02-01 ENCOUNTER — Encounter: Payer: Self-pay | Admitting: Family Medicine

## 2020-02-01 VITALS — BP 127/59 | HR 61 | Temp 97.1°F | Ht 70.0 in | Wt 213.4 lb

## 2020-02-01 DIAGNOSIS — E785 Hyperlipidemia, unspecified: Secondary | ICD-10-CM | POA: Diagnosis not present

## 2020-02-01 DIAGNOSIS — F32A Depression, unspecified: Secondary | ICD-10-CM

## 2020-02-01 DIAGNOSIS — E1142 Type 2 diabetes mellitus with diabetic polyneuropathy: Secondary | ICD-10-CM

## 2020-02-01 DIAGNOSIS — D696 Thrombocytopenia, unspecified: Secondary | ICD-10-CM

## 2020-02-01 DIAGNOSIS — F329 Major depressive disorder, single episode, unspecified: Secondary | ICD-10-CM

## 2020-02-01 DIAGNOSIS — I110 Hypertensive heart disease with heart failure: Secondary | ICD-10-CM | POA: Diagnosis not present

## 2020-02-01 DIAGNOSIS — E1169 Type 2 diabetes mellitus with other specified complication: Secondary | ICD-10-CM | POA: Diagnosis not present

## 2020-02-01 DIAGNOSIS — L989 Disorder of the skin and subcutaneous tissue, unspecified: Secondary | ICD-10-CM | POA: Diagnosis not present

## 2020-02-01 LAB — BAYER DCA HB A1C WAIVED: HB A1C (BAYER DCA - WAIVED): 6.1 % (ref <7.0)

## 2020-02-01 NOTE — Patient Instructions (Signed)
Schedule a visit with Dr Denna Haggard about the skin spots we saw today   Symbicort TWICE daily.  EVERY DAY  Use Lasix for next 2 days.  USE COMPRESSION HOSE!  Nicki Reaper, our counselor, will reach out to Mr Tebbetts.
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
## 2020-02-02 ENCOUNTER — Ambulatory Visit: Payer: Self-pay | Admitting: Licensed Clinical Social Worker

## 2020-02-02 DIAGNOSIS — H25813 Combined forms of age-related cataract, bilateral: Secondary | ICD-10-CM | POA: Diagnosis not present

## 2020-02-02 DIAGNOSIS — I25810 Atherosclerosis of coronary artery bypass graft(s) without angina pectoris: Secondary | ICD-10-CM

## 2020-02-02 DIAGNOSIS — C61 Malignant neoplasm of prostate: Secondary | ICD-10-CM

## 2020-02-02 DIAGNOSIS — G4733 Obstructive sleep apnea (adult) (pediatric): Secondary | ICD-10-CM

## 2020-02-02 DIAGNOSIS — E119 Type 2 diabetes mellitus without complications: Secondary | ICD-10-CM | POA: Diagnosis not present

## 2020-02-02 DIAGNOSIS — E1169 Type 2 diabetes mellitus with other specified complication: Secondary | ICD-10-CM

## 2020-02-02 DIAGNOSIS — E538 Deficiency of other specified B group vitamins: Secondary | ICD-10-CM

## 2020-02-02 DIAGNOSIS — H353111 Nonexudative age-related macular degeneration, right eye, early dry stage: Secondary | ICD-10-CM | POA: Diagnosis not present

## 2020-02-02 DIAGNOSIS — H5703 Miosis: Secondary | ICD-10-CM | POA: Diagnosis not present

## 2020-02-02 DIAGNOSIS — E1142 Type 2 diabetes mellitus with diabetic polyneuropathy: Secondary | ICD-10-CM

## 2020-02-02 LAB — CMP14+EGFR
ALT: 15 IU/L (ref 0–44)
AST: 19 IU/L (ref 0–40)
Albumin/Globulin Ratio: 2.1 (ref 1.2–2.2)
Albumin: 3.8 g/dL (ref 3.6–4.6)
Alkaline Phosphatase: 96 IU/L (ref 48–121)
BUN/Creatinine Ratio: 15 (ref 10–24)
BUN: 15 mg/dL (ref 8–27)
Bilirubin Total: 1.1 mg/dL (ref 0.0–1.2)
CO2: 26 mmol/L (ref 20–29)
Calcium: 9.3 mg/dL (ref 8.6–10.2)
Chloride: 105 mmol/L (ref 96–106)
Creatinine, Ser: 1.02 mg/dL (ref 0.76–1.27)
GFR calc Af Amer: 78 mL/min/{1.73_m2} (ref >59)
GFR calc non Af Amer: 67 mL/min/{1.73_m2} (ref >59)
Globulin, Total: 1.8 g/dL (ref 1.5–4.5)
Glucose: 127 mg/dL — ABNORMAL HIGH (ref 65–99)
Potassium: 4.6 mmol/L (ref 3.5–5.2)
Sodium: 142 mmol/L (ref 134–144)
Total Protein: 5.6 g/dL — ABNORMAL LOW (ref 6.0–8.5)

## 2020-02-02 LAB — CBC WITH DIFFERENTIAL/PLATELET
Basophils Absolute: 0 10*3/uL (ref 0.0–0.2)
Basos: 1 %
EOS (ABSOLUTE): 0.2 10*3/uL (ref 0.0–0.4)
Eos: 3 %
Hematocrit: 37.9 % (ref 37.5–51.0)
Hemoglobin: 12.8 g/dL — ABNORMAL LOW (ref 13.0–17.7)
Immature Grans (Abs): 0 10*3/uL (ref 0.0–0.1)
Immature Granulocytes: 1 %
Lymphocytes Absolute: 0.9 10*3/uL (ref 0.7–3.1)
Lymphs: 20 %
MCH: 31.5 pg (ref 26.6–33.0)
MCHC: 33.8 g/dL (ref 31.5–35.7)
MCV: 93 fL (ref 79–97)
Monocytes Absolute: 0.5 10*3/uL (ref 0.1–0.9)
Monocytes: 11 %
Neutrophils Absolute: 2.7 10*3/uL (ref 1.4–7.0)
Neutrophils: 64 %
Platelets: 92 10*3/uL — CL (ref 150–450)
RBC: 4.06 x10E6/uL — ABNORMAL LOW (ref 4.14–5.80)
RDW: 14.4 % (ref 11.6–15.4)
WBC: 4.3 10*3/uL (ref 3.4–10.8)

## 2020-02-02 LAB — LIPID PANEL
Chol/HDL Ratio: 2.9 ratio (ref 0.0–5.0)
Cholesterol, Total: 102 mg/dL (ref 100–199)
HDL: 35 mg/dL — ABNORMAL LOW (ref >39)
LDL Chol Calc (NIH): 51 mg/dL (ref 0–99)
Triglycerides: 81 mg/dL (ref 0–149)
VLDL Cholesterol Cal: 16 mg/dL (ref 5–40)

## 2020-02-02 LAB — TSH: TSH: 2.29 u[IU]/mL (ref 0.450–4.500)

## 2020-02-02 NOTE — Chronic Care Management (AMB) (Signed)
  Chronic Care Management    Clinical Social Work Follow Up Note  02/02/2020 Name: [name] MRN: [id number] DOB: 1932-09-02  [name] is a 86 y.o. year old male who is a primary care patient of [name], DO. The CCM team was consulted for assistance with [name] .   Review of patient status, including review of consultants reports, other relevant assessments, and collaboration with appropriate care team members and the patient's provider was performed as part of comprehensive patient evaluation and provision of chronic care management services.    SDOH (Social Determinants of Health) assessments performed: No   Outpatient Encounter Medications as of 02/02/2020  Medication Sig  . albuterol (PROVENTIL HFA;VENTOLIN HFA) 108 (90 Base) MCG/ACT inhaler Inhale 2 puffs into the lungs every 6 (six) hours as needed for wheezing or shortness of breath.  [name] amiodarone (PACERONE) 200 MG tablet Take 200 mg by mouth daily.  [name] amLODipine (NORVASC) 2.5 MG tablet TAKE 1 TABLET BY MOUTH  DAILY  . apixaban (ELIQUIS) 5 MG TABS tablet Take 1 tablet (5 mg total) by mouth 2 (two) times daily.  [name] atorvastatin (LIPITOR) 80 MG tablet TAKE 1 TABLET BY MOUTH  DAILY  . BESIVANCE 0.6 % SUSP   . budesonide-formoterol (SYMBICORT) 160-4.5 MCG/ACT inhaler Inhale 2 puffs into the lungs 2 (two) times daily.  . furosemide (LASIX) 20 MG tablet TAKE 1 TABLET BY MOUTH EVERY DAY  . glucose blood (ONETOUCH VERIO) test strip Use to check blood sugars daily  . Lancets (ONETOUCH ULTRASOFT) lancets Use to check blood sugars daily  . LOTEMAX 0.5 % ophthalmic suspension 1 drop 4 (four) times daily.  . meclizine (ANTIVERT) 25 MG tablet Take 1 tablet (25 mg total) by mouth 3 (three) times daily as needed for dizziness.  . metFORMIN (GLUCOPHAGE) 500 MG tablet TAKE 1 TABLET BY MOUTH  TWICE DAILY WITH MEALS  . metoprolol succinate (TOPROL-XL) 25 MG 24 hr tablet TAKE 1 TABLET BY MOUTH EVERY DAY  . Misc Natural  Products (OSTEO BI-FLEX ADV TRIPLE ST PO) Take by mouth.  . Multiple Vitamin (MULTIVITAMIN) tablet Take 1 tablet by mouth daily.  [name] omeprazole (PRILOSEC) 20 MG capsule TAKE 1 CAPSULE BY MOUTH  DAILY  . tamsulosin (FLOMAX) 0.4 MG CAPS capsule TAKE 1 CAPSULE BY MOUTH  DAILY AFTER SUPPER   No facility-administered encounter medications on file as of 02/02/2020.     [name] called client phone numbers several times today but was not able to speak with client or his spouse via phone today. [name] did leave phone message requesting that client or spouse of client please return call to [name] at [phone] to discuss CCM program services   Follow Up Plan: [name] to call client or spouse of client in next 4 weeks to talk with client or spouse about CCM program services available.  [name].Talayah Picardi MSW, [name] Licensed Clinical Social Worker [facility] [phone]

## 2020-02-02 NOTE — Patient Instructions (Addendum)
Clinical Social Work Note  02/02/2020  Name: [name]         MRN: [id number]       DOB: [birth year]  [name] is a 86 y.o. year old male who is a primary care patient of Calvin Norlander, DO. The CCM team was consulted for assistance with [name] .   Review of patient status, including review of consultants reports, other relevant assessments, and collaboration with appropriate care team members and the patient's provider was performed as part of comprehensive patient evaluation and provision of chronic care management services.    SDOH (Social Determinants of Health) assessments performed: No    [name] called client phone numbers several times today but was not able to speak with client or his spouse via phone today. [name] did leave phone message requesting that client or spouse of client please return call to [name] at [phone] to discuss CCM program services   Follow Up Plan: [name] to call client or spouse of client in next 4 weeks to talk with client or spouse about CCM program services available.  [name] was not able to speak with client or spouse of client via phone today; thus, the  patient or spouse were not able to verbalize understanding of instructions provided today and were not able to accept or decline a print copy of patient instruction materials.   [name].Dannie Woolen MSW, [name] Licensed Clinical Social Worker [facility] [phone]

## 2020-02-03 ENCOUNTER — Ambulatory Visit: Payer: Self-pay | Admitting: Licensed Clinical Social Worker

## 2020-02-03 DIAGNOSIS — G4733 Obstructive sleep apnea (adult) (pediatric): Secondary | ICD-10-CM

## 2020-02-03 DIAGNOSIS — E1142 Type 2 diabetes mellitus with diabetic polyneuropathy: Secondary | ICD-10-CM

## 2020-02-03 DIAGNOSIS — E1169 Type 2 diabetes mellitus with other specified complication: Secondary | ICD-10-CM

## 2020-02-03 DIAGNOSIS — Z951 Presence of aortocoronary bypass graft: Secondary | ICD-10-CM

## 2020-02-03 DIAGNOSIS — E785 Hyperlipidemia, unspecified: Secondary | ICD-10-CM

## 2020-02-03 DIAGNOSIS — I25810 Atherosclerosis of coronary artery bypass graft(s) without angina pectoris: Secondary | ICD-10-CM

## 2020-02-03 DIAGNOSIS — C61 Malignant neoplasm of prostate: Secondary | ICD-10-CM

## 2020-02-03 DIAGNOSIS — E538 Deficiency of other specified B group vitamins: Secondary | ICD-10-CM

## 2020-02-03 NOTE — Chronic Care Management (AMB) (Signed)
°  Chronic Care Management    Clinical Social Work Follow Up Note  02/03/2020 Name: [name] MRN: [id number] DOB: 1933-07-14  AUTHOR HATLESTAD is a 86 y.o. year old male who is a primary care patient of [name], DO. The CCM team was consulted for assistance with [name] .   Review of patient status, including review of consultants reports, other relevant assessments, and collaboration with appropriate care team members and the patient's provider was performed as part of comprehensive patient evaluation and provision of chronic care management services.    SDOH (Social Determinants of Health) assessments performed: No   Outpatient Encounter Medications as of 02/03/2020  Medication Sig   albuterol (PROVENTIL HFA;VENTOLIN HFA) 108 (90 Base) MCG/ACT inhaler Inhale 2 puffs into the lungs every 6 (six) hours as needed for wheezing or shortness of breath.   amiodarone (PACERONE) 200 MG tablet Take 200 mg by mouth daily.   amLODipine (NORVASC) 2.5 MG tablet TAKE 1 TABLET BY MOUTH  DAILY   apixaban (ELIQUIS) 5 MG TABS tablet Take 1 tablet (5 mg total) by mouth 2 (two) times daily.   atorvastatin (LIPITOR) 80 MG tablet TAKE 1 TABLET BY MOUTH  DAILY   BESIVANCE 0.6 % SUSP    budesonide-formoterol (SYMBICORT) 160-4.5 MCG/ACT inhaler Inhale 2 puffs into the lungs 2 (two) times daily.   furosemide (LASIX) 20 MG tablet TAKE 1 TABLET BY MOUTH EVERY DAY   glucose blood (ONETOUCH VERIO) test strip Use to check blood sugars daily   Lancets (ONETOUCH ULTRASOFT) lancets Use to check blood sugars daily   LOTEMAX 0.5 % ophthalmic suspension 1 drop 4 (four) times daily.   meclizine (ANTIVERT) 25 MG tablet Take 1 tablet (25 mg total) by mouth 3 (three) times daily as needed for dizziness.   metFORMIN (GLUCOPHAGE) 500 MG tablet TAKE 1 TABLET BY MOUTH  TWICE DAILY WITH MEALS   metoprolol succinate (TOPROL-XL) 25 MG 24 hr tablet TAKE 1 TABLET BY MOUTH EVERY DAY   Misc Natural  Products (OSTEO BI-FLEX ADV TRIPLE ST PO) Take by mouth.   Multiple Vitamin (MULTIVITAMIN) tablet Take 1 tablet by mouth daily.   omeprazole (PRILOSEC) 20 MG capsule TAKE 1 CAPSULE BY MOUTH  DAILY   tamsulosin (FLOMAX) 0.4 MG CAPS capsule TAKE 1 CAPSULE BY MOUTH  DAILY AFTER SUPPER   No facility-administered encounter medications on file as of 02/03/2020.     [name] spoke via phone today with daughter of client. Daughter of client requested face to face appointment of client with [name] Appointment of client with [name] face to face meeting is scheduled for 02/10/2020 at 2:15 PM at [facility]  Follow Up Plan: [name] to call client or daughter of client in next 4 weeks to talk with client or daughter about CCM program services  [name].Reyhan Moronta MSW, [name] Licensed Clinical Social Worker [facility] [phone]

## 2020-02-03 NOTE — Patient Instructions (Addendum)
Licensed Clinical Social Worker Visit Information  Materials Provided: No  02/03/2020  Name: [name]         MRN: [id number]       DOB: 16-May-1933  [name] is a 86 y.o. year old male who is a primary care patient of [name], DO. The CCM team was consulted for assistance with [name] .   Review of patient status, including review of consultants reports, other relevant assessments, and collaboration with appropriate care team members and the patient's provider was performed as part of comprehensive patient evaluation and provision of chronic care management services.    SDOH (Social Determinants of Health) assessments performed: No   [name] spoke via phone today with daughter of client. Daughter of client requested face to face appointment of client with [name] Appointment of client with [name] face to face meeting is scheduled for 02/10/2020 at 2:15 PM at [facility]  Follow Up Plan: [name] to call client or daughter of client in next 4 weeks to talk with client or daughter about CCM program services  The patient/daughter of patient verbalized understanding of instructions provided today and declined a print copy of patient instruction materials.   [name].Martin Belling MSW, [name] Licensed Clinical Social Worker [facility] [phone]

## 2020-02-10 ENCOUNTER — Encounter: Payer: Self-pay | Admitting: *Deleted

## 2020-02-10 ENCOUNTER — Other Ambulatory Visit: Payer: Self-pay

## 2020-02-10 ENCOUNTER — Ambulatory Visit (INDEPENDENT_AMBULATORY_CARE_PROVIDER_SITE_OTHER): Payer: Medicare Other | Admitting: Licensed Clinical Social Worker

## 2020-02-10 DIAGNOSIS — I25810 Atherosclerosis of coronary artery bypass graft(s) without angina pectoris: Secondary | ICD-10-CM | POA: Diagnosis not present

## 2020-02-10 DIAGNOSIS — E1142 Type 2 diabetes mellitus with diabetic polyneuropathy: Secondary | ICD-10-CM

## 2020-02-10 DIAGNOSIS — C61 Malignant neoplasm of prostate: Secondary | ICD-10-CM | POA: Diagnosis not present

## 2020-02-10 DIAGNOSIS — E1169 Type 2 diabetes mellitus with other specified complication: Secondary | ICD-10-CM

## 2020-02-10 DIAGNOSIS — G4733 Obstructive sleep apnea (adult) (pediatric): Secondary | ICD-10-CM

## 2020-02-10 DIAGNOSIS — Z951 Presence of aortocoronary bypass graft: Secondary | ICD-10-CM

## 2020-02-10 DIAGNOSIS — E785 Hyperlipidemia, unspecified: Secondary | ICD-10-CM

## 2020-02-10 DIAGNOSIS — E538 Deficiency of other specified B group vitamins: Secondary | ICD-10-CM

## 2020-02-10 NOTE — Progress Notes (Signed)
Patient in office today for appointment with social worker. States he fell at home and hit his head. Patient has a small knot below right temple and scrape on right elbow. Patient denies losing consciousness. States he fells okay. Patient not sure what he hit his head on. Advised patient ER for eval of head injury and he refuses. Patient states he will wait and see how he feels.

## 2020-02-10 NOTE — Patient Instructions (Addendum)
Licensed Clinical Social Worker Visit Information  Goals we discussed today:  Goals Addressed              This Visit's Progress   .  Client will talk with [name] in next 30 days about client management of health needs and clinet completion of daily activities (pt-stated)        CARE PLAN ENTRY  Current Barriers:  [name] Mobility issues of client with chronic diagnoses of HLD, s/p CABG X 3, OSA, Diabetes, CAD, B12 deficiency . Vision challenges (wears glasses )  Clinical Social Work Clinical Goal(s):  [name] [name] will call client in next 30 days to talk about client management of health needs and client completion of daily activities   Interventions: . Talked with Audra about CCM program . Talked with client about RNCM support with CCM program  . Talked with client about pain issues of client (spoke of pain in his right shoulder) . Talked with client about relaxation techniques of client (likes to listen to country music) . Talked with client about social support network (has support from his daughters) . Talked with client about transport needs of client . Talked with client about meal provision of client . Talked with client about vision challenges of client . Talked with client about anxiety/stress issues of client . Talked with client about his use of pacemaker . Talked with client about his breathing issues . Talked with client about his decreased energy   Patient Self Care Activities:  Does ADLs independently (uses grab bars for shower) Eats meals independently   Patient Self Care Deficits:   [name] Mobility challenges (uses a cane to help him walk) . Vision challenges (wears glasses)  Initial goal documentation       Materials Provided: No  Follow Up Plan: [name] to call client in next 4 weeks to talk with him about his management of health needs and to talk with him about his completion of daily activities  The patient verbalized understanding of instructions provided today  and declined a print copy of patient instruction materials.   [name].Chrys Landgrebe MSW, [name] Licensed Clinical Social Worker [facility] [phone]

## 2020-02-10 NOTE — Chronic Care Management (AMB) (Signed)
Chronic Care Management    Clinical Social Work Follow Up Note  02/10/2020 Name: [name] MRN: [id number] DOB: 1933-08-12  [name] is a 86 y.o. year old male who is a primary care patient of Calvin Norlander, DO. The CCM team was consulted for assistance with [name] .   Review of patient status, including review of consultants reports, other relevant assessments, and collaboration with appropriate care team members and the patient's provider was performed as part of comprehensive patient evaluation and provision of chronic care management services.    SDOH (Social Determinants of Health) assessments performed: [name] of tobacco use use; risk of depression; risk of transport needs; risk of social isolation    Chronic Care Management from 02/10/2020 in [facility]  PHQ-9 Total Score 5       GAD 7 : Generalized Anxiety Score 02/10/2020  Nervous, Anxious, on Edge 0  Control/stop worrying 0  Worry too much - different things 0  Trouble relaxing 0  Restless 0  Easily annoyed or irritable 0  Afraid - awful might happen 0  Total GAD 7 Score 0  Anxiety Difficulty Somewhat difficult    Outpatient Encounter Medications as of 02/10/2020  Medication Sig  . albuterol (PROVENTIL HFA;VENTOLIN HFA) 108 (90 Base) MCG/ACT inhaler Inhale 2 puffs into the lungs every 6 (six) hours as needed for wheezing or shortness of breath.  [name] amiodarone (PACERONE) 200 MG tablet Take 200 mg by mouth daily.  [name] amLODipine (NORVASC) 2.5 MG tablet TAKE 1 TABLET BY MOUTH  DAILY  . apixaban (ELIQUIS) 5 MG TABS tablet Take 1 tablet (5 mg total) by mouth 2 (two) times daily.  [name] atorvastatin (LIPITOR) 80 MG tablet TAKE 1 TABLET BY MOUTH  DAILY  . BESIVANCE 0.6 % SUSP   . budesonide-formoterol (SYMBICORT) 160-4.5 MCG/ACT inhaler Inhale 2 puffs into the lungs 2 (two) times daily.  . furosemide (LASIX) 20 MG tablet TAKE 1 TABLET BY MOUTH EVERY DAY  . glucose blood (ONETOUCH  VERIO) test strip Use to check blood sugars daily  . Lancets (ONETOUCH ULTRASOFT) lancets Use to check blood sugars daily  . LOTEMAX 0.5 % ophthalmic suspension 1 drop 4 (four) times daily.  . meclizine (ANTIVERT) 25 MG tablet Take 1 tablet (25 mg total) by mouth 3 (three) times daily as needed for dizziness.  . metFORMIN (GLUCOPHAGE) 500 MG tablet TAKE 1 TABLET BY MOUTH  TWICE DAILY WITH MEALS  . metoprolol succinate (TOPROL-XL) 25 MG 24 hr tablet TAKE 1 TABLET BY MOUTH EVERY DAY  . Misc Natural Products (OSTEO BI-FLEX ADV TRIPLE ST PO) Take by mouth.  . Multiple Vitamin (MULTIVITAMIN) tablet Take 1 tablet by mouth daily.  [name] omeprazole (PRILOSEC) 20 MG capsule TAKE 1 CAPSULE BY MOUTH  DAILY  . tamsulosin (FLOMAX) 0.4 MG CAPS capsule TAKE 1 CAPSULE BY MOUTH  DAILY AFTER SUPPER   No facility-administered encounter medications on file as of 02/10/2020.    Goals Addressed              This Visit's Progress   .  Client will talk with [name] in next 30 days about client management of health needs and client completion of daily activities (pt-stated)        CARE PLAN ENTRY  Current Barriers:  [name] Mobility issues of client with chronic diagnoses of HLD, OSA, Diabetes, CAD, B12 deficiency, s/p CABG x 3 . Vision challenges (wears glasses )  Clinical Social Work Clinical Goal(s):  .  [name] will call client in next 30 days to talk about client management of health needs and client completion of daily activities   Interventions: . Talked with [name] about CCM program . Talked with client about RNCM support with CCM program  . Talked with client about pain issues of client (spoke of pain in his right shoulder) . Talked with client about relaxation techniques of client (likes to listen to country music) . Talked with client about social support network (has support from his daughters) . Talked with client about transport needs of client . Talked with client about meal provision of client . Talked with  client about vision challenges of client . Talked with client about anxiety/stress issues of client  Patient Self Care Activities:  Does ADLs independently (uses grab bars for shower) Eats meals independently  Patient Self Care Deficits:   [name] Mobility challenges (uses a cane to help him walk) . Vision challenges (wears glasses)  Initial goal documentation       Follow Up Plan: [name] to call client in next 4 weeks to talk with him about his management of health needs and to talk with him about his completion of daily activities  [name] MSW, [name] Licensed Clinical Social Worker [facility] Care Management [phone]

## 2020-02-10 NOTE — Progress Notes (Signed)
I agree with ED evaluation

## 2020-02-13 ENCOUNTER — Other Ambulatory Visit: Payer: Self-pay | Admitting: Family Medicine

## 2020-02-13 DIAGNOSIS — N3946 Mixed incontinence: Secondary | ICD-10-CM

## 2020-02-21 ENCOUNTER — Ambulatory Visit (INDEPENDENT_AMBULATORY_CARE_PROVIDER_SITE_OTHER): Payer: Medicare Other | Admitting: *Deleted

## 2020-02-21 DIAGNOSIS — I441 Atrioventricular block, second degree: Secondary | ICD-10-CM | POA: Diagnosis not present

## 2020-02-21 LAB — CUP PACEART REMOTE DEVICE CHECK
Battery Remaining Longevity: 110 mo
Battery Remaining Percentage: 95.5 %
Battery Voltage: 3.01 V
Brady Statistic AP VP Percent: 54 %
Brady Statistic AP VS Percent: 1 %
Brady Statistic AS VP Percent: 38 %
Brady Statistic AS VS Percent: 1.3 %
Brady Statistic RA Percent Paced: 48 %
Brady Statistic RV Percent Paced: 93 %
Date Time Interrogation Session: 20210629025541
Implantable Lead Implant Date: 20200630
Implantable Lead Implant Date: 20200630
Implantable Lead Location: 753859
Implantable Lead Location: 753860
Implantable Pulse Generator Implant Date: 20200630
Lead Channel Impedance Value: 490 Ohm
Lead Channel Impedance Value: 700 Ohm
Lead Channel Pacing Threshold Amplitude: 0.5 V
Lead Channel Pacing Threshold Amplitude: 0.75 V
Lead Channel Pacing Threshold Pulse Width: 0.5 ms
Lead Channel Pacing Threshold Pulse Width: 0.5 ms
Lead Channel Sensing Intrinsic Amplitude: 0.7 mV
Lead Channel Sensing Intrinsic Amplitude: 12 mV
Lead Channel Setting Pacing Amplitude: 2 V
Lead Channel Setting Pacing Amplitude: 2.5 V
Lead Channel Setting Pacing Pulse Width: 0.5 ms
Lead Channel Setting Sensing Sensitivity: 2 mV
Pulse Gen Model: 2272
Pulse Gen Serial Number: 3311958

## 2020-02-22 NOTE — Progress Notes (Signed)
Remote pacemaker transmission.   

## 2020-03-02 DIAGNOSIS — H25811 Combined forms of age-related cataract, right eye: Secondary | ICD-10-CM | POA: Diagnosis not present

## 2020-03-08 ENCOUNTER — Ambulatory Visit (INDEPENDENT_AMBULATORY_CARE_PROVIDER_SITE_OTHER): Payer: Medicare Other | Admitting: Licensed Clinical Social Worker

## 2020-03-08 DIAGNOSIS — C61 Malignant neoplasm of prostate: Secondary | ICD-10-CM | POA: Diagnosis not present

## 2020-03-08 DIAGNOSIS — I480 Paroxysmal atrial fibrillation: Secondary | ICD-10-CM | POA: Diagnosis not present

## 2020-03-08 DIAGNOSIS — E1169 Type 2 diabetes mellitus with other specified complication: Secondary | ICD-10-CM | POA: Diagnosis not present

## 2020-03-08 DIAGNOSIS — I25810 Atherosclerosis of coronary artery bypass graft(s) without angina pectoris: Secondary | ICD-10-CM | POA: Diagnosis not present

## 2020-03-08 DIAGNOSIS — Z951 Presence of aortocoronary bypass graft: Secondary | ICD-10-CM

## 2020-03-08 DIAGNOSIS — E1142 Type 2 diabetes mellitus with diabetic polyneuropathy: Secondary | ICD-10-CM

## 2020-03-08 DIAGNOSIS — G4733 Obstructive sleep apnea (adult) (pediatric): Secondary | ICD-10-CM

## 2020-03-08 DIAGNOSIS — E785 Hyperlipidemia, unspecified: Secondary | ICD-10-CM | POA: Diagnosis not present

## 2020-03-08 NOTE — Patient Instructions (Addendum)
Licensed Clinical Social Worker Visit Information  Goals we discussed today:  Goals Addressed              This Visit's Progress   .  Client will talk with [name] in next 30 days about client management of health needs and clinet completion of daily activities (pt-stated)        CARE PLAN ENTRY  Current Barriers:  [name] Mobility issues of client with chronic diagnoses of CAD, Atrial Fibrillation, Diabetes, Prostate Cancer, HLD, s/p CABG X 3, OSA . Vision challenges (wears glasses )  Clinical Social Work Clinical Goal(s):  [name] [name] will call client in next 30 days to talk about client management of health needs and client completion of daily activities   Interventions: . Talked with [name]  about CCM program . Talked with client about RNCM support with CCM program  . Talked with client about pain issues of client (spoke of pain in his right shoulder) . Talked with client about relaxation techniques of client (likes to listen to country music) . Talked with client about social support network (has support from his daughters) . Talked with client about transport needs of client . Talked with client about meal provision of client . Talked with client about vision challenges of client . Talked with client about anxiety/stress issues of client . Talked with client about his use of pacemaker . Talked with client about his breathing issues . Talked with client about his decreased energy  Patient Self Care Activities:  Does ADLs independently (uses grab bars for shower) Eats meals independently   Patient Self Care Deficits:   [name] Mobility challenges (uses a cane to help him walk) . Vision challenges (wears glasses)  Initial goal documentation       Materials Provided: No  Follow Up Plan: [name] to call client in next 4 weeks to talk with him about his management of health needs and to talk with him about his completion of daily activities  The patient verbalized understanding of  instructions provided today and declined a print copy of patient instruction materials.   [name].Meryem Haertel MSW, [name] Licensed Clinical Social Worker [facility] Care Management [phone]

## 2020-03-08 NOTE — Chronic Care Management (AMB) (Signed)
Chronic Care Management    Clinical Social Work Follow Up Note  03/08/2020 Name: [name] MRN: [id number] DOB: [birth year]  [name] is a 86 y.o. year old male who is a primary care patient of [name], DO. The CCM team was consulted for assistance with [name] .   Review of patient status, including review of consultants reports, other relevant assessments, and collaboration with appropriate care team members and the patient's provider was performed as part of comprehensive patient evaluation and provision of chronic care management services.    SDOH (Social Determinants of Health) assessments performed: No;risk for tobacco use; risk for depression;risk for stress    Chronic Care Management from 02/10/2020 in [facility]  PHQ-9 Total Score 5       GAD 7 : Generalized Anxiety Score 02/10/2020  Nervous, Anxious, on Edge 0  Control/stop worrying 0  Worry too much - different things 0  Trouble relaxing 0  Restless 0  Easily annoyed or irritable 0  Afraid - awful might happen 0  Total GAD 7 Score 0  Anxiety Difficulty Somewhat difficult    Outpatient Encounter Medications as of 03/08/2020  Medication Sig  . tamsulosin (FLOMAX) 0.4 MG CAPS capsule TAKE 1 CAPSULE BY MOUTH  DAILY AFTER SUPPER  . albuterol (PROVENTIL HFA;VENTOLIN HFA) 108 (90 Base) MCG/ACT inhaler Inhale 2 puffs into the lungs every 6 (six) hours as needed for wheezing or shortness of breath.  [name] amiodarone (PACERONE) 200 MG tablet Take 200 mg by mouth daily.  [name] amLODipine (NORVASC) 2.5 MG tablet TAKE 1 TABLET BY MOUTH  DAILY  . apixaban (ELIQUIS) 5 MG TABS tablet Take 1 tablet (5 mg total) by mouth 2 (two) times daily.  [name] atorvastatin (LIPITOR) 80 MG tablet TAKE 1 TABLET BY MOUTH  DAILY  . BESIVANCE 0.6 % SUSP   . budesonide-formoterol (SYMBICORT) 160-4.5 MCG/ACT inhaler Inhale 2 puffs into the lungs 2 (two) times daily.  . furosemide (LASIX) 20 MG tablet TAKE 1  TABLET BY MOUTH EVERY DAY  . glucose blood (ONETOUCH VERIO) test strip Use to check blood sugars daily  . Lancets (ONETOUCH ULTRASOFT) lancets Use to check blood sugars daily  . LOTEMAX 0.5 % ophthalmic suspension 1 drop 4 (four) times daily.  . meclizine (ANTIVERT) 25 MG tablet Take 1 tablet (25 mg total) by mouth 3 (three) times daily as needed for dizziness.  . metFORMIN (GLUCOPHAGE) 500 MG tablet TAKE 1 TABLET BY MOUTH  TWICE DAILY WITH MEALS  . metoprolol succinate (TOPROL-XL) 25 MG 24 hr tablet TAKE 1 TABLET BY MOUTH EVERY DAY  . Misc Natural Products (OSTEO BI-FLEX ADV TRIPLE ST PO) Take by mouth.  . Multiple Vitamin (MULTIVITAMIN) tablet Take 1 tablet by mouth daily.  [name] omeprazole (PRILOSEC) 20 MG capsule TAKE 1 CAPSULE BY MOUTH  DAILY   No facility-administered encounter medications on file as of 03/08/2020.    Goals Addressed              This Visit's Progress   .  Client will talk with [name] in next 30 days about client management of health needs and clinet completion of daily activities (pt-stated)        CARE PLAN ENTRY  Current Barriers:  [name] Mobility issues of client with chronic diagnoses of CAD, Atrial Fibrillation, Diabetes, Prostate Cancer, HLD, s/p CABG X 3, OSA . Vision challenges (wears glasses )  Clinical Social Work Clinical Goal(s):  [name] [name] will call client in  next 30 days to talk about client management of health needs and client completion of daily activities   Interventions: . Talked with Neng about CCM program . Talked with client about RNCM support with CCM program  . Talked with client about pain issues of client (spoke of pain in his right shoulder) . Talked with client about relaxation techniques of client (likes to listen to country music) . Talked with client about social support network (has support from his daughters) . Talked with client about transport needs of client . Talked with client about meal provision of client . Talked with client  about vision challenges of client . Talked with client about anxiety/stress issues of client . Talked with client about his use of pacemaker . Talked with client about his breathing issues . Talked with client about his decreased energy  Patient Self Care Activities:  Does ADLs independently (uses grab bars for shower) Eats meals independently   Patient Self Care Deficits:   [name] Mobility challenges (uses a cane to help him walk) . Vision challenges (wears glasses)  Initial goal documentation       Follow Up Plan: [name] to call client in next 4 weeks to talk with him about his management of health needs and to talk with him about his completion of daily activities  [name].Alcee Sipos MSW, [name] Licensed Clinical Social Worker [facility] Care [facility] [phone]

## 2020-03-14 ENCOUNTER — Ambulatory Visit: Payer: Medicare Other | Admitting: Dermatology

## 2020-03-14 DIAGNOSIS — H2512 Age-related nuclear cataract, left eye: Secondary | ICD-10-CM | POA: Diagnosis not present

## 2020-03-16 ENCOUNTER — Ambulatory Visit: Payer: Self-pay

## 2020-03-16 DIAGNOSIS — H25812 Combined forms of age-related cataract, left eye: Secondary | ICD-10-CM | POA: Diagnosis not present

## 2020-03-29 DIAGNOSIS — L570 Actinic keratosis: Secondary | ICD-10-CM | POA: Diagnosis not present

## 2020-03-29 DIAGNOSIS — C44319 Basal cell carcinoma of skin of other parts of face: Secondary | ICD-10-CM | POA: Diagnosis not present

## 2020-03-29 DIAGNOSIS — L821 Other seborrheic keratosis: Secondary | ICD-10-CM | POA: Diagnosis not present

## 2020-03-29 DIAGNOSIS — L280 Lichen simplex chronicus: Secondary | ICD-10-CM | POA: Diagnosis not present

## 2020-03-29 DIAGNOSIS — D225 Melanocytic nevi of trunk: Secondary | ICD-10-CM | POA: Diagnosis not present

## 2020-03-29 DIAGNOSIS — X32XXXA Exposure to sunlight, initial encounter: Secondary | ICD-10-CM | POA: Diagnosis not present

## 2020-04-09 ENCOUNTER — Encounter: Payer: Self-pay | Admitting: *Deleted

## 2020-04-09 ENCOUNTER — Encounter: Payer: Self-pay | Admitting: Cardiology

## 2020-04-09 ENCOUNTER — Ambulatory Visit: Payer: Medicare Other | Admitting: Cardiology

## 2020-04-09 VITALS — BP 128/62 | HR 62 | Ht 71.0 in | Wt 213.0 lb

## 2020-04-09 DIAGNOSIS — I110 Hypertensive heart disease with heart failure: Secondary | ICD-10-CM

## 2020-04-09 DIAGNOSIS — R600 Localized edema: Secondary | ICD-10-CM

## 2020-04-09 DIAGNOSIS — R0609 Other forms of dyspnea: Secondary | ICD-10-CM

## 2020-04-09 DIAGNOSIS — R079 Chest pain, unspecified: Secondary | ICD-10-CM

## 2020-04-09 DIAGNOSIS — R0600 Dyspnea, unspecified: Secondary | ICD-10-CM | POA: Diagnosis not present

## 2020-04-09 MED ORDER — FUROSEMIDE 20 MG PO TABS: 20.0000 mg | ORAL_TABLET | Freq: Every day | ORAL | 1 refills | Status: DC

## 2020-04-09 MED ORDER — AMIODARONE HCL 200 MG PO TABS: 200.0000 mg | ORAL_TABLET | Freq: Every day | ORAL | 1 refills | Status: DC

## 2020-04-09 MED ORDER — APIXABAN 5 MG PO TABS: 5.0000 mg | ORAL_TABLET | Freq: Two times a day (BID) | ORAL | 6 refills | Status: DC

## 2020-04-09 MED ORDER — AMLODIPINE BESYLATE 2.5 MG PO TABS: 2.5000 mg | ORAL_TABLET | Freq: Every day | ORAL | 1 refills | Status: DC

## 2020-04-09 MED ORDER — METOPROLOL SUCCINATE ER 25 MG PO TB24: 25.0000 mg | ORAL_TABLET | Freq: Every day | ORAL | 1 refills | Status: DC

## 2020-04-09 NOTE — Patient Instructions (Addendum)
Your physician recommends that you schedule a follow-up appointment in: [place]  Your physician recommends that you continue on your current medications as directed. Please refer to the Current Medication list given to you today.  Your physician has requested that you have a lexiscan myoview. For further information please visit [email or web address]. Please follow instruction sheet, as given.  Thank you for choosing [facility]!!
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
## 2020-04-10 ENCOUNTER — Ambulatory Visit (INDEPENDENT_AMBULATORY_CARE_PROVIDER_SITE_OTHER): Payer: Medicare Other | Admitting: Licensed Clinical Social Worker

## 2020-04-10 DIAGNOSIS — C61 Malignant neoplasm of prostate: Secondary | ICD-10-CM | POA: Diagnosis not present

## 2020-04-10 DIAGNOSIS — E1169 Type 2 diabetes mellitus with other specified complication: Secondary | ICD-10-CM | POA: Diagnosis not present

## 2020-04-10 DIAGNOSIS — E785 Hyperlipidemia, unspecified: Secondary | ICD-10-CM

## 2020-04-10 DIAGNOSIS — I25810 Atherosclerosis of coronary artery bypass graft(s) without angina pectoris: Secondary | ICD-10-CM

## 2020-04-10 DIAGNOSIS — I480 Paroxysmal atrial fibrillation: Secondary | ICD-10-CM | POA: Diagnosis not present

## 2020-04-10 DIAGNOSIS — G4733 Obstructive sleep apnea (adult) (pediatric): Secondary | ICD-10-CM

## 2020-04-10 DIAGNOSIS — Z951 Presence of aortocoronary bypass graft: Secondary | ICD-10-CM

## 2020-04-10 DIAGNOSIS — E1142 Type 2 diabetes mellitus with diabetic polyneuropathy: Secondary | ICD-10-CM

## 2020-04-10 NOTE — Patient Instructions (Addendum)
Licensed Clinical Social Worker Visit Information  Goals we discussed today:      Client will talk with [name] in next 30 days about client management of health needs and clinet completion of daily activities (pt-stated)        CARE PLAN ENTRY  Current Barriers:   Mobility issues of client with chronic diagnoses of CAD, Atrial Fibrillation, Diabetes, Prostate Cancer, HLD, s/p CABG X 3, OSA  Vision challenges (wears glasses )  Clinical Social Work Clinical Goal(s):   [name] will call client in next 30 days to talk about client management of health needs and client completion of daily activities   Interventions:  Talked with Warner Mccreedy about CCM program  Talked with client about RNCM support with CCM program   Talked with client about pain issues of client (spoke of pain in his right shoulder)  Talked with client about relaxation techniques of client (likes to listen to country music)  Talked with client about social support network (has support from his daughters)  Talked with client about transport needs of client  Talked with client about meal provision of client  Talked with client about his use of pacemaker  Talked with client about his breathing issues  Talked with client about his decreased energy  Talked with client about his recent medical appointments  Talked with Jonathon Bellows, spouse of client about sleeping issues of client  Talked with [name] about ambulation of client (uses a cane to help him walk)  Talked with [name] about client shortness of breath issues (she said he has inhaler to use as needed)  Talked with [name] about financial needs of client  Patient Self Care Activities:  Does ADLs independently (uses grab bars for shower) Eats meals independently   Patient Self Care Deficits:    Mobility challenges (uses a cane to help him walk)  Vision challenges (wears glasses)  Initial goal documentation    Follow Up Plan: [name] to call client in next  4 weeks to talk with him about his management of health needs and to talk with him about his completion of daily activities  Materials Provided: No  The patient verbalized understanding of instructions provided today and declined a print copy of patient instruction materials.   [name].Leston Schueller MSW, [name] Licensed Clinical Social Worker [facility] Care Management [phone]

## 2020-04-10 NOTE — Chronic Care Management (AMB) (Signed)
Chronic Care Management    Clinical Social Work Follow Up Note  04/10/2020 Name: [name] MRN: [id number] DOB: 1933-03-31  [name] is a 86 y.o. year old male who is a primary care patient of [name], DO. The CCM team was consulted for assistance with [name] .   Review of patient status, including review of consultants reports, other relevant assessments, and collaboration with appropriate care team members and the patient's provider was performed as part of comprehensive patient evaluation and provision of chronic care management services.    SDOH (Social Determinants of Health) assessments performed: No;risk for tobacco use; risk for depression;risk for stress; risk for physical inactivity    Chronic Care Management from 02/10/2020 in [place]  PHQ-9 Total Score 5       GAD 7 : Generalized Anxiety Score 02/10/2020  Nervous, Anxious, on Edge 0  Control/stop worrying 0  Worry too much - different things 0  Trouble relaxing 0  Restless 0  Easily annoyed or irritable 0  Afraid - awful might happen 0  Total GAD 7 Score 0  Anxiety Difficulty Somewhat difficult    Outpatient Encounter Medications as of 04/10/2020  Medication Sig   albuterol (PROVENTIL HFA;VENTOLIN HFA) 108 (90 Base) MCG/ACT inhaler Inhale 2 puffs into the lungs every 6 (six) hours as needed for wheezing or shortness of breath.   amiodarone (PACERONE) 200 MG tablet Take 1 tablet (200 mg total) by mouth daily.   amLODipine (NORVASC) 2.5 MG tablet Take 1 tablet (2.5 mg total) by mouth daily.   apixaban (ELIQUIS) 5 MG TABS tablet Take 1 tablet (5 mg total) by mouth 2 (two) times daily.   atorvastatin (LIPITOR) 80 MG tablet TAKE 1 TABLET BY MOUTH  DAILY   BESIVANCE 0.6 % SUSP    budesonide-formoterol (SYMBICORT) 160-4.5 MCG/ACT inhaler Inhale 2 puffs into the lungs 2 (two) times daily.   furosemide (LASIX) 20 MG tablet Take 1 tablet (20 mg total) by mouth  daily.   glucose blood (ONETOUCH VERIO) test strip Use to check blood sugars daily   Lancets (ONETOUCH ULTRASOFT) lancets Use to check blood sugars daily   LOTEMAX 0.5 % ophthalmic suspension 1 drop 4 (four) times daily.   meclizine (ANTIVERT) 25 MG tablet Take 1 tablet (25 mg total) by mouth 3 (three) times daily as needed for dizziness.   metFORMIN (GLUCOPHAGE) 500 MG tablet TAKE 1 TABLET BY MOUTH  TWICE DAILY WITH MEALS   metoprolol succinate (TOPROL-XL) 25 MG 24 hr tablet Take 1 tablet (25 mg total) by mouth daily.   Misc Natural Products (OSTEO BI-FLEX ADV TRIPLE ST PO) Take by mouth.   Multiple Vitamin (MULTIVITAMIN) tablet Take 1 tablet by mouth daily.   omeprazole (PRILOSEC) 20 MG capsule TAKE 1 CAPSULE BY MOUTH  DAILY   tamsulosin (FLOMAX) 0.4 MG CAPS capsule TAKE 1 CAPSULE BY MOUTH  DAILY AFTER SUPPER   No facility-administered encounter medications on file as of 04/10/2020.    Goals      Client will talk with [name] in next 30 days about client management of health needs and clinet completion of daily activities (pt-stated)      CARE PLAN ENTRY  Current Barriers:   Mobility issues of client with chronic diagnoses of CAD, Atrial Fibrillation, Diabetes, Prostate Cancer, HLD, s/p CABG X 3, OSA  Vision challenges (wears glasses )  Clinical Social Work Clinical Goal(s):   [name] will call client in next 30 days to talk  about client management of health needs and client completion of daily activities   Interventions:  Talked with [name] about CCM program  Talked with client about RNCM support with CCM program   Talked with client about pain issues of client (spoke of pain in his right shoulder)  Talked with client about relaxation techniques of client (likes to listen to country music)  Talked with client about social support network (has support from his daughters)  Talked with client about transport needs of client  Talked with client about meal provision of  client  Talked with client about his use of pacemaker  Talked with client about his breathing issues  Talked with client about his decreased energy  Talked with client about his recent medical appointments  Talked with Jonathon Bellows, spouse of client about sleeping issues of client  Talked with [name] about ambulation of client (uses a cane to help him walk)  Talked with [name] about client shortness of breath issues (she said he has inhaler to use as needed)  Talked with [name] about financial needs of client  Patient Self Care Activities:  Does ADLs independently (uses grab bars for shower) Eats meals independently   Patient Self Care Deficits:    Mobility challenges (uses a cane to help him walk)  Vision challenges (wears glasses)  Initial goal documentation    Follow Up Plan: [name] to call client in next 4 weeks to talk with him about his management of health needs and to talk with him about his completion of daily activities  [name].Olita Takeshita MSW, [name] Licensed Clinical Social Worker [facility] Care Management [phone]

## 2020-04-12 DIAGNOSIS — E119 Type 2 diabetes mellitus without complications: Secondary | ICD-10-CM | POA: Diagnosis not present

## 2020-04-12 DIAGNOSIS — H353111 Nonexudative age-related macular degeneration, right eye, early dry stage: Secondary | ICD-10-CM | POA: Diagnosis not present

## 2020-04-13 ENCOUNTER — Ambulatory Visit: Payer: Medicare Other | Admitting: Cardiology

## 2020-04-16 ENCOUNTER — Other Ambulatory Visit: Payer: Self-pay

## 2020-04-16 ENCOUNTER — Encounter (HOSPITAL_COMMUNITY): Payer: Self-pay

## 2020-04-16 ENCOUNTER — Other Ambulatory Visit: Payer: Self-pay | Admitting: Family Medicine

## 2020-04-16 ENCOUNTER — Encounter (HOSPITAL_COMMUNITY)
Admission: RE | Admit: 2020-04-16 | Discharge: 2020-04-16 | Disposition: A | Discharge status: Home / Self Care | Payer: Medicare Other | Source: Ambulatory Visit | Attending: Cardiology | Admitting: Cardiology

## 2020-04-16 ENCOUNTER — Encounter (HOSPITAL_BASED_OUTPATIENT_CLINIC_OR_DEPARTMENT_OTHER)
Admission: RE | Admit: 2020-04-16 | Discharge: 2020-04-16 | Disposition: A | Discharge status: Home / Self Care | Payer: Medicare Other | Source: Ambulatory Visit | Attending: Cardiology | Admitting: Cardiology

## 2020-04-16 DIAGNOSIS — R0600 Dyspnea, unspecified: Secondary | ICD-10-CM

## 2020-04-16 DIAGNOSIS — R0609 Other forms of dyspnea: Secondary | ICD-10-CM

## 2020-04-16 DIAGNOSIS — R079 Chest pain, unspecified: Secondary | ICD-10-CM

## 2020-04-16 DIAGNOSIS — R600 Localized edema: Secondary | ICD-10-CM

## 2020-04-16 MED ORDER — SODIUM CHLORIDE FLUSH 0.9 % IV SOLN
INTRAVENOUS | Status: AC
  Administered 2020-04-16: 10 mL via INTRAVENOUS
  Filled 2020-04-16: qty 10

## 2020-04-16 MED ORDER — TECHNETIUM TC 99M TETROFOSMIN IV KIT
10.0000 | PACK | Freq: Once | INTRAVENOUS | Status: AC | PRN
  Administered 2020-04-16: 11 via INTRAVENOUS

## 2020-04-16 MED ORDER — TECHNETIUM TC 99M TETROFOSMIN IV KIT
30.0000 | PACK | Freq: Once | INTRAVENOUS | Status: AC
  Administered 2020-04-16: 30 via INTRAVENOUS

## 2020-04-16 MED ORDER — REGADENOSON 0.4 MG/5ML IV SOLN
INTRAVENOUS | Status: AC
  Administered 2020-04-16: 0.4 mg via INTRAVENOUS
  Filled 2020-04-16: qty 5

## 2020-04-17 ENCOUNTER — Ambulatory Visit: Payer: Medicare Other | Admitting: Family Medicine

## 2020-04-17 LAB — NM MYOCAR MULTI W/SPECT W/WALL MOTION / EF
LV dias vol: 163 mL (ref 62–150)
LV sys vol: 124 mL
Peak HR: 76 {beats}/min
RATE: 0.25
Rest HR: 67 {beats}/min
SDS: 4
SRS: 6
SSS: 10
TID: 1.15

## 2020-04-20 ENCOUNTER — Telehealth: Payer: Self-pay | Admitting: Cardiology

## 2020-04-20 DIAGNOSIS — R9439 Abnormal result of other cardiovascular function study: Secondary | ICD-10-CM

## 2020-04-20 NOTE — Telephone Encounter (Signed)
Calling for stress test results   [phone]

## 2020-04-23 NOTE — Telephone Encounter (Signed)
Pt voiced understanding - echo scheduled for Wednesday   Stress test overall does not show any severe new blockages. Perhaps a very small blocakge on the bottom heart that would be considered low risk. The test did suggest that the pumping function of his heart may have decreased, the test is not great at measuring this but we need to evaluate further with an echo which will clear show his heart function, please order for SOB

## 2020-04-24 ENCOUNTER — Telehealth: Payer: Self-pay | Admitting: Cardiology

## 2020-04-24 NOTE — Telephone Encounter (Signed)
Returned call to daughter Abigail Butts) - reviewed results of the stress test with her per below message - Echo is scheduled for tomorrow at 2:30 in the [place]  office.    Massie Maroon, CMA  04/23/2020 4:45 PM EDT     Pt voiced understanding - echo scheduled for Wednesday    Arnoldo Lenis, MD  04/23/2020 4:20 PM EDT     Stress test overall does not show any severe new blockages. Perhaps a very small blocakge on the bottom heart that would be considered low risk. The test did suggest that the pumping function of his heart may have decreased, the test is not great at measuring this but we need to evaluate further with an echo which will clear show his heart function, please order for SOB   Zandra Abts MD

## 2020-04-24 NOTE — Telephone Encounter (Signed)
New message    Patient daughter has question about her dad taking Lasik , is he suppose to take it every day or only when he has swelling.  Needs to clarify his instruction

## 2020-04-24 NOTE — Telephone Encounter (Signed)
Please call daughter-Wendy/ YX:AJLU results. Pt's daughter states she rec'd a call that test results were not good and for Pt to come in office to see Dr.Branch on tomorrow.   Please call Abigail Butts [phone]   Thanks renee

## 2020-04-24 NOTE — Telephone Encounter (Signed)
Returned call to daughter Anselmo Pickler)  Informed her of below - per Dr. Harl Bowie last dictation on 04/09/2020 - note states :  9. LE edema  - started on lasix by pcp - issues in 06/2019 with fluid overload -takes lasix $RemoveBefo'20mg'[id number]$  as needed. Weights are stable -------------------------------------------------------------------------------------------------------------------------  Last seen by pcp Lajuana Ripple) on 02/01/2020 - her note states :    3. Hypertensive heart disease with congestive heart failure, unspecified heart failure type ([place]) Controlled.  Given 1+ pitting edema noted at ankles and weight gain I did advise for 2 days worth of Lasix.  If persistent, low threshold to have him reevaluated - CMP14+EGFR ---------------------------------------------------------------------------------------------------------------------------- His is due for f/u with his pcp on 05/04/2020 & daughter aware.

## 2020-04-25 ENCOUNTER — Ambulatory Visit (INDEPENDENT_AMBULATORY_CARE_PROVIDER_SITE_OTHER): Payer: Medicare Other

## 2020-04-25 ENCOUNTER — Telehealth: Payer: Self-pay | Admitting: Cardiology

## 2020-04-25 DIAGNOSIS — R0600 Dyspnea, unspecified: Secondary | ICD-10-CM

## 2020-04-25 DIAGNOSIS — R600 Localized edema: Secondary | ICD-10-CM

## 2020-04-25 DIAGNOSIS — I2510 Atherosclerotic heart disease of native coronary artery without angina pectoris: Secondary | ICD-10-CM | POA: Diagnosis not present

## 2020-04-25 DIAGNOSIS — R0609 Other forms of dyspnea: Secondary | ICD-10-CM

## 2020-04-25 DIAGNOSIS — R9439 Abnormal result of other cardiovascular function study: Secondary | ICD-10-CM | POA: Diagnosis not present

## 2020-04-25 LAB — ECHOCARDIOGRAM COMPLETE
AR max vel: 1.52 cm2
AV Area VTI: 1.45 cm2
AV Area mean vel: 1.37 cm2
AV Mean grad: 11.2 mmHg
AV Peak grad: 21.9 mmHg
Ao pk vel: 2.34 m/s
Area-P 1/2: 2.72 cm2
Calc EF: 35.7 %
MV M vel: 2.63 m/s
MV Peak grad: 27.6 mmHg
P 1/2 time: 798 msec
S' Lateral: 5.34 cm
Single Plane A2C EF: 39.5 %
Single Plane A4C EF: 37.2 %

## 2020-04-25 MED ORDER — FUROSEMIDE 20 MG PO TABS: 20.0000 mg | ORAL_TABLET | Freq: Every day | ORAL | 2 refills | Status: DC

## 2020-04-25 MED ORDER — APIXABAN 5 MG PO TABS: 5.0000 mg | ORAL_TABLET | Freq: Two times a day (BID) | ORAL | 0 refills | Status: DC

## 2020-04-25 NOTE — Telephone Encounter (Signed)
Daughter advised that refill send for lasix Advised that patient assistance application for eliquis will be provided and that all patient sections would need to be filled out and brought back to office along with proof of household income, and prescription out of pocket expense for 2021. Verbalized understanding.

## 2020-04-25 NOTE — Telephone Encounter (Signed)
Requesting samples of apixaban (ELIQUIS) 5 MG TABS tablet [[id number]]   Cannot pay $160 a month for it.   And he's needing a refill on furosemide (LASIX) 20 MG tablet [[id number]]   Sent to [facility]

## 2020-05-01 ENCOUNTER — Other Ambulatory Visit: Payer: Self-pay | Admitting: Family Medicine

## 2020-05-01 ENCOUNTER — Telehealth: Payer: Self-pay | Admitting: *Deleted

## 2020-05-01 DIAGNOSIS — E785 Hyperlipidemia, unspecified: Secondary | ICD-10-CM

## 2020-05-01 NOTE — Telephone Encounter (Signed)
-----   Message from Arnoldo Lenis, MD sent at 04/27/2020 10:07 AM EDT ----- Echo does show some decrease in heart pumping function which may be contributing to his SOB. Needs PA f/u 2-3 weeks to discuss possibly medciation changes   Zandra Abts MD

## 2020-05-02 NOTE — Telephone Encounter (Signed)
Pt daughter voiced understanding - scheduled with 9/13 with Katina Dung, NP

## 2020-05-03 DIAGNOSIS — L570 Actinic keratosis: Secondary | ICD-10-CM | POA: Diagnosis not present

## 2020-05-03 DIAGNOSIS — Z85828 Personal history of other malignant neoplasm of skin: Secondary | ICD-10-CM | POA: Diagnosis not present

## 2020-05-03 DIAGNOSIS — Z08 Encounter for follow-up examination after completed treatment for malignant neoplasm: Secondary | ICD-10-CM | POA: Diagnosis not present

## 2020-05-03 DIAGNOSIS — X32XXXD Exposure to sunlight, subsequent encounter: Secondary | ICD-10-CM | POA: Diagnosis not present

## 2020-05-03 DIAGNOSIS — L820 Inflamed seborrheic keratosis: Secondary | ICD-10-CM | POA: Diagnosis not present

## 2020-05-04 ENCOUNTER — Encounter: Payer: Self-pay | Admitting: Family Medicine

## 2020-05-04 ENCOUNTER — Ambulatory Visit (INDEPENDENT_AMBULATORY_CARE_PROVIDER_SITE_OTHER): Payer: Medicare Other | Admitting: Family Medicine

## 2020-05-04 ENCOUNTER — Other Ambulatory Visit: Payer: Self-pay

## 2020-05-04 VITALS — BP 132/65 | HR 60 | Temp 97.7°F | Resp 18 | Ht 71.0 in | Wt 212.2 lb

## 2020-05-04 DIAGNOSIS — E1169 Type 2 diabetes mellitus with other specified complication: Secondary | ICD-10-CM | POA: Diagnosis not present

## 2020-05-04 DIAGNOSIS — E785 Hyperlipidemia, unspecified: Secondary | ICD-10-CM

## 2020-05-04 DIAGNOSIS — I10 Essential (primary) hypertension: Secondary | ICD-10-CM

## 2020-05-04 DIAGNOSIS — E1142 Type 2 diabetes mellitus with diabetic polyneuropathy: Secondary | ICD-10-CM | POA: Diagnosis not present

## 2020-05-04 DIAGNOSIS — K219 Gastro-esophageal reflux disease without esophagitis: Secondary | ICD-10-CM | POA: Diagnosis not present

## 2020-05-04 DIAGNOSIS — E1159 Type 2 diabetes mellitus with other circulatory complications: Secondary | ICD-10-CM

## 2020-05-04 DIAGNOSIS — I152 Hypertension secondary to endocrine disorders: Secondary | ICD-10-CM

## 2020-05-04 DIAGNOSIS — I480 Paroxysmal atrial fibrillation: Secondary | ICD-10-CM | POA: Diagnosis not present

## 2020-05-04 LAB — BAYER DCA HB A1C WAIVED: HB A1C (BAYER DCA - WAIVED): 6.2 % (ref <7.0)

## 2020-05-04 MED ORDER — OMEPRAZOLE 20 MG PO CPDR: DELAYED_RELEASE_CAPSULE | ORAL | 3 refills | Status: DC

## 2020-05-04 MED ORDER — METFORMIN HCL 500 MG PO TABS: 500.0000 mg | ORAL_TABLET | Freq: Two times a day (BID) | ORAL | 3 refills | Status: DC

## 2020-05-04 MED ORDER — ATORVASTATIN CALCIUM 80 MG PO TABS: 80.0000 mg | ORAL_TABLET | Freq: Every day | ORAL | 3 refills | Status: DC
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
## 2020-05-05 LAB — CBC
Hematocrit: 37.3 % — ABNORMAL LOW (ref 37.5–51.0)
Hemoglobin: 13 g/dL (ref 13.0–17.7)
MCH: 33.3 pg — ABNORMAL HIGH (ref 26.6–33.0)
MCHC: 34.9 g/dL (ref 31.5–35.7)
MCV: 96 fL (ref 79–97)
Platelets: 83 10*3/uL — CL (ref 150–450)
RBC: 3.9 x10E6/uL — ABNORMAL LOW (ref 4.14–5.80)
RDW: 15.8 % — ABNORMAL HIGH (ref 11.6–15.4)
WBC: 4.9 10*3/uL (ref 3.4–10.8)

## 2020-05-05 LAB — BASIC METABOLIC PANEL
BUN/Creatinine Ratio: 11 (ref 10–24)
BUN: 12 mg/dL (ref 8–27)
CO2: 29 mmol/L (ref 20–29)
Calcium: 9.4 mg/dL (ref 8.6–10.2)
Chloride: 101 mmol/L (ref 96–106)
Creatinine, Ser: 1.11 mg/dL (ref 0.76–1.27)
GFR calc Af Amer: 70 mL/min/{1.73_m2} (ref >59)
GFR calc non Af Amer: 60 mL/min/{1.73_m2} (ref >59)
Glucose: 120 mg/dL — ABNORMAL HIGH (ref 65–99)
Potassium: 4.1 mmol/L (ref 3.5–5.2)
Sodium: 141 mmol/L (ref 134–144)

## 2020-05-05 LAB — TSH: TSH: 2.19 u[IU]/mL (ref 0.450–4.500)

## 2020-05-05 LAB — MICROALBUMIN / CREATININE URINE RATIO
Creatinine, Urine: 155.4 mg/dL
Microalb/Creat Ratio: 8 mg/g creat (ref 0–29)
Microalbumin, Urine: 12.7 ug/mL

## 2020-05-07 ENCOUNTER — Ambulatory Visit: Payer: Medicare Other | Admitting: Family Medicine

## 2020-05-09 ENCOUNTER — Encounter (HOSPITAL_COMMUNITY): Payer: Self-pay | Admitting: Physical Therapy

## 2020-05-09 NOTE — Therapy (Unsigned)
[place], [place], [address] Phone: [phone]   Fax:  [phone]  Patient Details  Name: [name] MRN: [id number] Date of Birth: [birth year] Referring Provider:  Lajuana Ripple  Encounter Date: 05/09/2020   PHYSICAL THERAPY DISCHARGE SUMMARY  Visits from Start of Care: 5  Current functional level related to goals / functional outcomes: unknown    Remaining deficits: unknown   Education / Equipment: HEP Plan: Patient agrees to discharge.  Patient goals were partially met. Patient is being discharged due to not returning since the last visit.  ?????      Rayetta Humphrey, PT CLT [phone] 05/09/2020, 9:56 AM  [place], [place], [address] Phone: [phone]   Fax:  [phone]
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
## 2020-05-15 ENCOUNTER — Ambulatory Visit: Payer: Medicare Other | Admitting: Family Medicine

## 2020-05-15 ENCOUNTER — Other Ambulatory Visit: Payer: Self-pay | Admitting: *Deleted

## 2020-05-15 ENCOUNTER — Encounter: Payer: Self-pay | Admitting: Family Medicine

## 2020-05-15 VITALS — BP 138/70 | HR 63 | Ht 70.0 in | Wt 213.6 lb

## 2020-05-15 DIAGNOSIS — R001 Bradycardia, unspecified: Secondary | ICD-10-CM | POA: Diagnosis not present

## 2020-05-15 DIAGNOSIS — I480 Paroxysmal atrial fibrillation: Secondary | ICD-10-CM

## 2020-05-15 DIAGNOSIS — I2510 Atherosclerotic heart disease of native coronary artery without angina pectoris: Secondary | ICD-10-CM

## 2020-05-15 DIAGNOSIS — I10 Essential (primary) hypertension: Secondary | ICD-10-CM

## 2020-05-15 DIAGNOSIS — I5023 Acute on chronic systolic (congestive) heart failure: Secondary | ICD-10-CM

## 2020-05-15 DIAGNOSIS — I429 Cardiomyopathy, unspecified: Secondary | ICD-10-CM

## 2020-05-15 MED ORDER — SACUBITRIL-VALSARTAN 24-26 MG PO TABS: 1.0000 | ORAL_TABLET | Freq: Two times a day (BID) | ORAL | 6 refills | Status: DC

## 2020-05-15 MED ORDER — ISOSORBIDE MONONITRATE ER 30 MG PO TB24: 30.0000 mg | ORAL_TABLET | Freq: Every day | ORAL | 6 refills | Status: DC

## 2020-05-15 MED ORDER — ENTRESTO 24-26 MG PO TABS: 1.0000 | ORAL_TABLET | Freq: Two times a day (BID) | ORAL | 0 refills | Status: DC

## 2020-05-15 NOTE — Patient Instructions (Addendum)
Medication Instructions:   Begin Imdur 30mg  daily.   Begin Entresto 24/26mg  twice a day  Continue all other medications.    Labwork:  BMET, Magnesium - orders given today.  Please do in 2 weeks, around 05/29/20.    Office will contact with results via phone or letter.    Testing/Procedures: none  Follow-Up: 1 month   Any Other Special Instructions Will Be Listed Below (If Applicable). Your physician has requested that you regularly monitor and record your blood pressure readings at home. Please use the same machine at the same time of day to check your readings and record them x 2 weeks and return to office for provider review.    If you need a refill on your cardiac medications before your next appointment, please call your pharmacy.

## 2020-05-16 ENCOUNTER — Ambulatory Visit (INDEPENDENT_AMBULATORY_CARE_PROVIDER_SITE_OTHER): Payer: Medicare Other | Admitting: Licensed Clinical Social Worker

## 2020-05-16 DIAGNOSIS — E1142 Type 2 diabetes mellitus with diabetic polyneuropathy: Secondary | ICD-10-CM

## 2020-05-16 DIAGNOSIS — Z951 Presence of aortocoronary bypass graft: Secondary | ICD-10-CM

## 2020-05-16 DIAGNOSIS — I480 Paroxysmal atrial fibrillation: Secondary | ICD-10-CM | POA: Diagnosis not present

## 2020-05-16 DIAGNOSIS — C61 Malignant neoplasm of prostate: Secondary | ICD-10-CM

## 2020-05-16 DIAGNOSIS — E785 Hyperlipidemia, unspecified: Secondary | ICD-10-CM

## 2020-05-16 DIAGNOSIS — I25810 Atherosclerosis of coronary artery bypass graft(s) without angina pectoris: Secondary | ICD-10-CM

## 2020-05-16 DIAGNOSIS — E1169 Type 2 diabetes mellitus with other specified complication: Secondary | ICD-10-CM

## 2020-05-16 DIAGNOSIS — G4733 Obstructive sleep apnea (adult) (pediatric): Secondary | ICD-10-CM

## 2020-05-16 NOTE — Patient Instructions (Addendum)
Licensed Clinical Social Worker Visit Information  Goals we discussed today:   Client will talk with [name] in next 30 days about client management of health needs and clinet completion of daily activities (pt-stated)          CARE PLAN ENTRY  Current Barriers:   Mobility issues of client with chronic diagnoses of CAD, Atrial Fibrillation, Diabetes, Prostate Cancer, HLD, s/p CABG X 3, OSA  Vision challenges (wears glasses )  Clinical Social Work Clinical Goal(s):   [name] will call client in next 30 days to talk about client management of health needs and client completion of daily activities   Interventions:  Talked with Anselmo Pickler, daughter of client, about RNCM support with CCM program   Talked with Abigail Butts about pain issues of client   Talked with Abigail Butts about relaxation techniques of client (likes to listen to country music,likes to watch TV, likes to sit on porch)  Talked with client previously about social support network (has support from his daughters)  Talked with Abigail Butts about transport needs of client  Talked with Abigail Butts about meal provision of client  Talked with Abigail Butts about vision challenges of client  Talked with Abigail Butts about anxiety/stress issues of client  Talked with Abigail Butts about client use of pacemaker  Talked with Abigail Butts about breathing issues of client  Talked with Abigail Butts about decreased energy of client  Talked with Abigail Butts about client occasional swelling in right ankle, right foot  Talked with Abigail Butts about mood of client  Talked with Abigail Butts about client completion of ADLs  Patient Self Care Activities:  Does ADLs independently (uses grab bars for shower) Eats meals independently   Patient Self Care Deficits:    Mobility challenges (uses a cane to help him walk)  Vision challenges (wears glasses)  Initial goal documentation    Follow Up Plan:  [name] to call client in next 4 weeks to talk with him about his management of health needs and  to talk with him about his completion of daily activities  Materials Provided: No  The patient /[name], daughter of patient,verbalized understanding of instructions provided today and declined a print copy of patient instruction materials.   [name] MSW, [name] Licensed Clinical Social Worker [facility] [phone]

## 2020-05-16 NOTE — Chronic Care Management (AMB) (Signed)
Chronic Care Management    Clinical Social Work Follow Up Note  05/16/2020 Name: [name] MRN: [id number] DOB: 10-Jan-1933  SRIHITH AQUILINO is a 86 y.o. year old male who is a primary care patient of [name], DO. The CCM team was consulted for assistance with [name] .   Review of patient status, including review of consultants reports, other relevant assessments, and collaboration with appropriate care team members and the patient's provider was performed as part of comprehensive patient evaluation and provision of chronic care management services.    SDOH (Social Determinants of Health) assessments performed: No; risk for tobacco use; risk for depression; risk for stress; risk for physical inactivity    Chronic Care Management from 02/10/2020 in [place]  PHQ-9 Total Score 5     GAD 7 : Generalized Anxiety Score 02/10/2020  Nervous, Anxious, on Edge 0  Control/stop worrying 0  Worry too much - different things 0  Trouble relaxing 0  Restless 0  Easily annoyed or irritable 0  Afraid - awful might happen 0  Total GAD 7 Score 0  Anxiety Difficulty Somewhat difficult    Outpatient Encounter Medications as of 05/16/2020  Medication Sig  . albuterol (PROVENTIL HFA;VENTOLIN HFA) 108 (90 Base) MCG/ACT inhaler Inhale 2 puffs into the lungs every 6 (six) hours as needed for wheezing or shortness of breath.  [name] amiodarone (PACERONE) 200 MG tablet Take 1 tablet (200 mg total) by mouth daily.  [name] amLODipine (NORVASC) 2.5 MG tablet Take 1 tablet (2.5 mg total) by mouth daily.  [name] apixaban (ELIQUIS) 5 MG TABS tablet Take 1 tablet (5 mg total) by mouth 2 (two) times daily.  [name] atorvastatin (LIPITOR) 80 MG tablet Take 1 tablet (80 mg total) by mouth daily.  [name] BESIVANCE 0.6 % SUSP   . budesonide-formoterol (SYMBICORT) 160-4.5 MCG/ACT inhaler Inhale 2 puffs into the lungs 2 (two) times daily.  . furosemide (LASIX) 20 MG tablet Take 1 tablet (20 mg  total) by mouth daily.  [name] glucose blood (ONETOUCH VERIO) test strip Use to check blood sugars daily  . isosorbide mononitrate (IMDUR) 30 MG 24 hr tablet Take 1 tablet (30 mg total) by mouth daily.  . Lancets (ONETOUCH ULTRASOFT) lancets Use to check blood sugars daily  . meclizine (ANTIVERT) 25 MG tablet Take 1 tablet (25 mg total) by mouth 3 (three) times daily as needed for dizziness.  . metFORMIN (GLUCOPHAGE) 500 MG tablet Take 1 tablet (500 mg total) by mouth 2 (two) times daily with a meal.  . metoprolol succinate (TOPROL-XL) 25 MG 24 hr tablet Take 1 tablet (25 mg total) by mouth daily.  . Misc Natural Products (OSTEO BI-FLEX ADV TRIPLE ST PO) Take by mouth.  . Multiple Vitamin (MULTIVITAMIN) tablet Take 1 tablet by mouth daily.  [name] omeprazole (PRILOSEC) 20 MG capsule TAKE 1 CAPSULE BY MOUTH  DAILY  . sacubitril-valsartan (ENTRESTO) 24-26 MG Take 1 tablet by mouth 2 (two) times daily.  . tamsulosin (FLOMAX) 0.4 MG CAPS capsule TAKE 1 CAPSULE BY MOUTH  DAILY AFTER SUPPER   No facility-administered encounter medications on file as of 05/16/2020.    Goals    .  Client will talk with [name] in next 84 days about client management of health needs and clinet completion of daily activities (pt-stated)      CARE PLAN ENTRY  Current Barriers:  [name] Mobility issues of client with chronic diagnoses of CAD, Atrial Fibrillation, Diabetes, Prostate Cancer, HLD, s/p  CABG X 3, OSA . Vision challenges (wears glasses )  Clinical Social Work Clinical Goal(s):  [name] [name] will call client in next 84 days to talk about client management of health needs and client completion of daily activities   Interventions: . Talked with Anselmo Pickler, daughter of client, about RNCM support with CCM program  . Talked with Abigail Butts about pain issues of client  . Talked with Abigail Butts about relaxation techniques of client (likes to listen to country music,likes to watch TV, likes to sit on porch) . Talked with client previously about  social support network (has support from his daughters) . Talked with Abigail Butts about transport needs of client . Talked with Abigail Butts about meal provision of client . Talked with Abigail Butts about vision challenges of client . Talked with Abigail Butts about anxiety/stress issues of client . Talked with Abigail Butts about client use of pacemaker . Talked with Abigail Butts about breathing issues of client . Talked with Abigail Butts about decreased energy of client . Talked with Abigail Butts about client occasional swelling in right ankle, right foot . Talked with Abigail Butts about mood of client . Talked with Abigail Butts about client completion of ADLs  Patient Self Care Activities:  Does ADLs independently (uses grab bars for shower) Eats meals independently   Patient Self Care Deficits:   [name] Mobility challenges (uses a cane to help him walk) . Vision challenges (wears glasses)  Initial goal documentation    Follow Up Plan:  [name] to call client in next 4 weeks to talk with him about his management of health needs and to talk with him about his completion of daily activities  [name].Yaw Escoto MSW, [name] Licensed Clinical Social Worker [facility] [phone]

## 2020-05-21 ENCOUNTER — Ambulatory Visit: Payer: Medicare Other | Admitting: Dermatology

## 2020-05-22 ENCOUNTER — Ambulatory Visit (INDEPENDENT_AMBULATORY_CARE_PROVIDER_SITE_OTHER): Payer: Medicare Other | Admitting: Emergency Medicine

## 2020-05-22 DIAGNOSIS — I441 Atrioventricular block, second degree: Secondary | ICD-10-CM

## 2020-05-22 LAB — CUP PACEART REMOTE DEVICE CHECK
Battery Remaining Longevity: 112 mo
Battery Remaining Percentage: 95.5 %
Battery Voltage: 3.01 V
Brady Statistic AP VP Percent: 53 %
Brady Statistic AP VS Percent: 1 %
Brady Statistic AS VP Percent: 40 %
Brady Statistic AS VS Percent: 1.3 %
Brady Statistic RA Percent Paced: 47 %
Brady Statistic RV Percent Paced: 93 %
Date Time Interrogation Session: 20210928020015
Implantable Lead Implant Date: 20200630
Implantable Lead Implant Date: 20200630
Implantable Lead Location: 753859
Implantable Lead Location: 753860
Implantable Pulse Generator Implant Date: 20200630
Lead Channel Impedance Value: 530 Ohm
Lead Channel Impedance Value: 710 Ohm
Lead Channel Pacing Threshold Amplitude: 0.5 V
Lead Channel Pacing Threshold Amplitude: 0.75 V
Lead Channel Pacing Threshold Pulse Width: 0.5 ms
Lead Channel Pacing Threshold Pulse Width: 0.5 ms
Lead Channel Sensing Intrinsic Amplitude: 0.7 mV
Lead Channel Sensing Intrinsic Amplitude: 12 mV
Lead Channel Setting Pacing Amplitude: 2 V
Lead Channel Setting Pacing Amplitude: 2.5 V
Lead Channel Setting Pacing Pulse Width: 0.5 ms
Lead Channel Setting Sensing Sensitivity: 2 mV
Pulse Gen Model: 2272
Pulse Gen Serial Number: 3311958

## 2020-05-24 NOTE — Progress Notes (Signed)
Remote pacemaker transmission.   

## 2020-05-25 ENCOUNTER — Ambulatory Visit (INDEPENDENT_AMBULATORY_CARE_PROVIDER_SITE_OTHER): Payer: Medicare Other | Admitting: Family Medicine

## 2020-05-25 ENCOUNTER — Encounter: Payer: Self-pay | Admitting: Family Medicine

## 2020-05-25 ENCOUNTER — Other Ambulatory Visit: Payer: Self-pay

## 2020-05-25 ENCOUNTER — Telehealth: Payer: Self-pay | Admitting: *Deleted

## 2020-05-25 VITALS — BP 88/50 | HR 64 | Temp 97.5°F | Ht 70.0 in | Wt 211.8 lb

## 2020-05-25 DIAGNOSIS — R140 Abdominal distension (gaseous): Secondary | ICD-10-CM

## 2020-05-25 DIAGNOSIS — L304 Erythema intertrigo: Secondary | ICD-10-CM

## 2020-05-25 DIAGNOSIS — I5023 Acute on chronic systolic (congestive) heart failure: Secondary | ICD-10-CM | POA: Diagnosis not present

## 2020-05-25 MED ORDER — NYSTATIN 100000 UNIT/GM EX CREA: 1.0000 "application " | TOPICAL_CREAM | Freq: Two times a day (BID) | CUTANEOUS | 0 refills | Status: DC

## 2020-05-25 MED ORDER — METOPROLOL SUCCINATE ER 25 MG PO TB24
12.5000 mg | ORAL_TABLET | Freq: Every day | ORAL | 1 refills | Status: DC
Start: 2020-05-25 — End: 2021-02-26

## 2020-05-25 NOTE — Patient Instructions (Signed)
STOP Amlodipine CUT the metoprolol IN HALF MONITOR Blood pressure.  If remains low, you get more short of breath, have pain with deep breathing or are getting more swollen GO TO EMERGENCY DEPARTMENT  Dr Nelly Laurence office will call you for an appointment for next week.   Intertrigo Intertrigo is skin irritation (inflammation) that happens in warm, moist areas of the body. The irritation can cause a rash and make skin raw and itchy. The rash is usually pink or red. It happens mostly between folds of skin or where skin rubs together, such as:  Between the toes.  In the armpits.  In the groin area.  Under the belly.  Under the breasts.  Around the butt area. This condition is not passed from person to person (is not contagious). What are the causes?  Heat, moisture, rubbing, and not enough air movement.  The condition can be made worse by: ? Sweat. ? Bacteria. ? A fungus, such as yeast. What increases the risk?  Moisture in your skin folds.  You are more likely to develop this condition if you: ? Have diabetes. ? Are overweight. ? Are not able to move around. ? Live in a warm and moist climate. ? Wear splints, braces, or other medical devices. ? Are not able to control your pee (urine) or poop (stool). What are the signs or symptoms?  A pink or red skin rash in the skin fold or near the skin fold.  Raw or scaly skin.  Itching.  A burning feeling.  Bleeding.  Leaking fluid.  A bad smell. How is this treated?  Cleaning and drying your skin.  Taking an antibiotic medicine or using an antibiotic skin cream for a bacterial infection.  Using an antifungal cream on your skin or taking pills for an infection that was caused by a fungus, such as yeast.  Using a steroid ointment to stop the itching and irritation.  Separating the skin fold with a clean cotton cloth to absorb moisture and allow air to flow into the area. Follow these instructions at home:  Keep the  affected area clean and dry.  Do not scratch your skin.  Stay cool as much as you can. Use an air conditioner or a fan, if you have one.  Apply over-the-counter and prescription medicines only as told by your doctor.  If you were prescribed an antibiotic medicine, use it as told by your doctor. Do not stop using the antibiotic even if your condition starts to get better.  Keep all follow-up visits as told by your doctor. This is important. How is this prevented?   Stay at a healthy weight.  Take care of your feet. This is very important if you have diabetes. You should: ? Wear shoes that fit well. ? Keep your feet dry. ? Wear clean cotton or wool socks.  Protect the skin in your groin and butt area as told by your doctor. To do this: ? Follow a regular cleaning routine. ? Use creams, powders, or ointments that protect your skin. ? Change protection pads often.  Do not wear tight clothes. Wear clothes that: ? Are loose. ? Take moisture away from your body. ? Are made of cotton.  Wear a bra that gives good support, if needed.  Shower and dry yourself well after being active. Use a hair dryer on a cool setting to dry between skin folds.  Keep your blood sugar under control if you have diabetes. Contact a doctor if:  Your  symptoms do not get better with treatment.  Your symptoms get worse or they spread.  You notice more redness and warmth.  You have a fever. Summary  Intertrigo is skin irritation that occurs when folds of skin rub together.  This condition is caused by heat, moisture, and rubbing.  This condition may be treated by cleaning and drying your skin and with medicines.  Apply over-the-counter and prescription medicines only as told by your doctor.  Keep all follow-up visits as told by your doctor. This is important. This information is not intended to replace advice given to you by your health care provider. Make sure you discuss any questions you have  with your health care provider. Document Revised: 05/20/2018 Document Reviewed: 05/20/2018 Elsevier Patient Education  2020 [employer].

## 2020-05-25 NOTE — Telephone Encounter (Signed)
-----   Message from Verta Ellen., NP sent at 05/25/2020  2:16 PM EDT ----- Regarding: Low BP  History of HF Can we get [name] in the next week for follow-up?  I just talked to Dr. Lajuana Ripple.  He is in her office now with a low blood pressures.  We are stopping the Imdur, amlodipine, and carvedilol for now until he comes back to see [place].  Thank you

## 2020-05-25 NOTE — Telephone Encounter (Signed)
Contacted patient and was advised to call daughter [name] about his medications.
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
## 2020-05-25 NOTE — Telephone Encounter (Signed)
Per [name] should not be on carvedilol. I told her to cut his Toprol in half and stop his Imdur and amlodipine

## 2020-05-26 LAB — BASIC METABOLIC PANEL
BUN/Creatinine Ratio: 16 (ref 10–24)
BUN: 17 mg/dL (ref 8–27)
CO2: 26 mmol/L (ref 20–29)
Calcium: 8.8 mg/dL (ref 8.6–10.2)
Chloride: 98 mmol/L (ref 96–106)
Creatinine, Ser: 1.05 mg/dL (ref 0.76–1.27)
GFR calc Af Amer: 74 mL/min/{1.73_m2} (ref >59)
GFR calc non Af Amer: 64 mL/min/{1.73_m2} (ref >59)
Glucose: 109 mg/dL — ABNORMAL HIGH (ref 65–99)
Potassium: 3.7 mmol/L (ref 3.5–5.2)
Sodium: 144 mmol/L (ref 134–144)

## 2020-05-26 LAB — MAGNESIUM: Magnesium: 1.5 mg/dL — ABNORMAL LOW (ref 1.6–2.3)

## 2020-05-26 LAB — BRAIN NATRIURETIC PEPTIDE: BNP: 205.6 pg/mL — ABNORMAL HIGH (ref 0.0–100.0)

## 2020-05-28 ENCOUNTER — Telehealth: Payer: Self-pay

## 2020-05-28 ENCOUNTER — Ambulatory Visit (INDEPENDENT_AMBULATORY_CARE_PROVIDER_SITE_OTHER): Payer: Medicare Other | Admitting: Family Medicine

## 2020-05-28 ENCOUNTER — Encounter: Payer: Self-pay | Admitting: Family Medicine

## 2020-05-28 VITALS — BP 122/62 | HR 74 | Ht 71.0 in | Wt 209.0 lb

## 2020-05-28 DIAGNOSIS — I10 Essential (primary) hypertension: Secondary | ICD-10-CM

## 2020-05-28 DIAGNOSIS — R001 Bradycardia, unspecified: Secondary | ICD-10-CM

## 2020-05-28 DIAGNOSIS — I2510 Atherosclerotic heart disease of native coronary artery without angina pectoris: Secondary | ICD-10-CM | POA: Diagnosis not present

## 2020-05-28 DIAGNOSIS — I480 Paroxysmal atrial fibrillation: Secondary | ICD-10-CM

## 2020-05-28 DIAGNOSIS — I5022 Chronic systolic (congestive) heart failure: Secondary | ICD-10-CM

## 2020-05-28 DIAGNOSIS — I429 Cardiomyopathy, unspecified: Secondary | ICD-10-CM

## 2020-05-28 MED ORDER — APIXABAN 5 MG PO TABS
5.0000 mg | ORAL_TABLET | Freq: Two times a day (BID) | ORAL | 0 refills | Status: DC
Start: 2020-05-28 — End: 2020-06-19

## 2020-05-28 NOTE — Patient Instructions (Addendum)
Your physician recommends that you schedule a follow-up appointment in: [place]  Your physician recommends that you continue on your current medications as directed. Please refer to the Current Medication list given to you today.  PLEASE HAVE YOUR DAUGHTER CONTACT us REGARDING ELIQUIS AND ENTRESTO   Thank you for choosing [facility]!!
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
## 2020-05-28 NOTE — Telephone Encounter (Signed)
Patient here for office visit with Katina Dung, NP now and medication changes will be managed during visit.

## 2020-05-29 ENCOUNTER — Telehealth: Payer: Self-pay | Admitting: Family Medicine

## 2020-05-29 NOTE — Telephone Encounter (Signed)
Instructions from yesterday's AVS read to patient's daughter Vincent Gros confirmed that patient is taking eliquis and entresto

## 2020-05-29 NOTE — Telephone Encounter (Signed)
Daughter )Ileene Hutchinson) would like to speak with someone regarding her father's visit on 05-28-2020. States that his wife did not understand what was told to patient.  [phone].

## 2020-06-04 NOTE — Telephone Encounter (Signed)
These two are difficult to get.  I he has met the following criteria we can get approved:  eliquis--he has to have spent 3% of his annual income out of pocket for medications (has to have proof of spending--print out from drug store); needs proof of income:  Most recent year's tax return  W2 form  Three months of paycheck stubs  Social Security statement (1099)   entresto--he needs proof of income: - You can include a copy of your financial documents, which include the following:  Most recent year's tax return  W2 form  Three months of paycheck stubs  Social Security statement (1099)  IF he does not have or meet this criteria--we can send to [place] for assistance  He can make 30 min pharmacy clinic appt if he can bring in the above requested info

## 2020-06-05 NOTE — Telephone Encounter (Signed)
Pt daughter aware to gather this information and call back for a 30 min pharm visit.

## 2020-06-09 ENCOUNTER — Other Ambulatory Visit: Payer: Self-pay | Admitting: Family Medicine

## 2020-06-11 ENCOUNTER — Other Ambulatory Visit (HOSPITAL_COMMUNITY): Payer: Self-pay | Admitting: Cardiology

## 2020-06-11 ENCOUNTER — Ambulatory Visit: Payer: Medicare Other | Admitting: Family Medicine
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
## 2020-06-19 ENCOUNTER — Encounter: Payer: Self-pay | Admitting: Family Medicine

## 2020-06-19 ENCOUNTER — Encounter: Payer: Self-pay | Admitting: *Deleted

## 2020-06-19 ENCOUNTER — Ambulatory Visit: Payer: Medicare Other | Admitting: Family Medicine

## 2020-06-19 VITALS — BP 114/58 | HR 56 | Ht 71.0 in | Wt 210.4 lb

## 2020-06-19 DIAGNOSIS — I5020 Unspecified systolic (congestive) heart failure: Secondary | ICD-10-CM

## 2020-06-19 DIAGNOSIS — I10 Essential (primary) hypertension: Secondary | ICD-10-CM

## 2020-06-19 DIAGNOSIS — I2510 Atherosclerotic heart disease of native coronary artery without angina pectoris: Secondary | ICD-10-CM | POA: Diagnosis not present

## 2020-06-19 DIAGNOSIS — I480 Paroxysmal atrial fibrillation: Secondary | ICD-10-CM | POA: Diagnosis not present

## 2020-06-19 DIAGNOSIS — R001 Bradycardia, unspecified: Secondary | ICD-10-CM

## 2020-06-19 DIAGNOSIS — I429 Cardiomyopathy, unspecified: Secondary | ICD-10-CM

## 2020-06-19 MED ORDER — SACUBITRIL-VALSARTAN 24-26 MG PO TABS: 1.0000 | ORAL_TABLET | Freq: Two times a day (BID) | ORAL | 3 refills | Status: DC

## 2020-06-19 MED ORDER — APIXABAN 5 MG PO TABS
5.0000 mg | ORAL_TABLET | Freq: Two times a day (BID) | ORAL | 0 refills | Status: DC
Start: 2020-06-19 — End: 2020-06-19

## 2020-06-19 MED ORDER — SACUBITRIL-VALSARTAN 24-26 MG PO TABS: 1.0000 | ORAL_TABLET | Freq: Two times a day (BID) | ORAL | 0 refills | Status: DC

## 2020-06-19 MED ORDER — APIXABAN 5 MG PO TABS
5.0000 mg | ORAL_TABLET | Freq: Two times a day (BID) | ORAL | 3 refills | Status: DC
Start: 2020-06-19 — End: 2020-07-25

## 2020-06-19 NOTE — Patient Instructions (Addendum)
Medication Instructions:  Continue all current medications.  Labwork: none  Testing/Procedures: none  Follow-Up: 3 months   Any Other Special Instructions Will Be Listed Below (If Applicable).  If you need a refill on your cardiac medications before your next appointment, please call your pharmacy.  

## 2020-06-25 ENCOUNTER — Ambulatory Visit: Payer: Medicare Other | Admitting: Licensed Clinical Social Worker

## 2020-06-25 DIAGNOSIS — C61 Malignant neoplasm of prostate: Secondary | ICD-10-CM

## 2020-06-25 DIAGNOSIS — E1142 Type 2 diabetes mellitus with diabetic polyneuropathy: Secondary | ICD-10-CM

## 2020-06-25 DIAGNOSIS — I480 Paroxysmal atrial fibrillation: Secondary | ICD-10-CM

## 2020-06-25 DIAGNOSIS — Z951 Presence of aortocoronary bypass graft: Secondary | ICD-10-CM

## 2020-06-25 DIAGNOSIS — G4733 Obstructive sleep apnea (adult) (pediatric): Secondary | ICD-10-CM

## 2020-06-25 DIAGNOSIS — E1169 Type 2 diabetes mellitus with other specified complication: Secondary | ICD-10-CM

## 2020-06-25 DIAGNOSIS — I25810 Atherosclerosis of coronary artery bypass graft(s) without angina pectoris: Secondary | ICD-10-CM

## 2020-06-25 NOTE — Chronic Care Management (AMB) (Signed)
Chronic Care Management    Clinical Social Work Follow Up Note  06/25/2020 Name: [name] MRN: [id number] DOB: 01-28-1933  [name] is a 86 y.o. year old male who is a primary care patient of Calvin Norlander, DO. The CCM team was consulted for assistance with [employer] Hunter .   Review of patient status, including review of consultants reports, other relevant assessments, and collaboration with appropriate care team members and the patient's provider was performed as part of comprehensive patient evaluation and provision of chronic care management services.    SDOH (Social Determinants of Health) assessments performed: No; risk for depression; risk for tobacco use; risk for stress; risk for physical inactivity    Office Visit from 05/25/2020 in [facility]  PHQ-9 Total Score 15       GAD 7 : Generalized Anxiety Score 02/10/2020  Nervous, Anxious, on Edge 0  Control/stop worrying 0  Worry too much - different things 0  Trouble relaxing 0  Restless 0  Easily annoyed or irritable 0  Afraid - awful might happen 0  Total GAD 7 Score 0  Anxiety Difficulty Somewhat difficult    Outpatient Encounter Medications as of 06/25/2020  Medication Sig  . albuterol (PROVENTIL HFA;VENTOLIN HFA) 108 (90 Base) MCG/ACT inhaler Inhale 2 puffs into the lungs every 6 (six) hours as needed for wheezing or shortness of breath.  [name] amiodarone (PACERONE) 200 MG tablet Take 1 tablet (200 mg total) by mouth daily.  [name] apixaban (ELIQUIS) 5 MG TABS tablet Take 1 tablet (5 mg total) by mouth 2 (two) times daily.  [name] atorvastatin (LIPITOR) 80 MG tablet Take 1 tablet (80 mg total) by mouth daily.  [name] BESIVANCE 0.6 % SUSP   . budesonide-formoterol (SYMBICORT) 160-4.5 MCG/ACT inhaler Inhale 2 puffs into the lungs 2 (two) times daily.  . furosemide (LASIX) 20 MG tablet Take 1 tablet (20 mg total) by mouth daily.  [name] glucose blood (ONETOUCH VERIO) test strip test blood sugars daily  Dx E11.9  . Lancets (ONETOUCH ULTRASOFT) lancets Use to check blood sugars daily  . meclizine (ANTIVERT) 25 MG tablet Take 1 tablet (25 mg total) by mouth 3 (three) times daily as needed for dizziness.  . metFORMIN (GLUCOPHAGE) 500 MG tablet Take 1 tablet (500 mg total) by mouth 2 (two) times daily with a meal.  . metoprolol succinate (TOPROL-XL) 25 MG 24 hr tablet Take 0.5 tablets (12.5 mg total) by mouth daily.  . Misc Natural Products (OSTEO BI-FLEX ADV TRIPLE ST PO) Take by mouth.  . Multiple Vitamin (MULTIVITAMIN) tablet Take 1 tablet by mouth daily.  [name] nystatin cream (MYCOSTATIN) Apply 1 application topically 2 (two) times daily. x7-14 days (groin)  . omeprazole (PRILOSEC) 20 MG capsule TAKE 1 CAPSULE BY MOUTH  DAILY  . sacubitril-valsartan (ENTRESTO) 24-26 MG Take 1 tablet by mouth 2 (two) times daily.  . tamsulosin (FLOMAX) 0.4 MG CAPS capsule TAKE 1 CAPSULE BY MOUTH  DAILY AFTER SUPPER   No facility-administered encounter medications on file as of 06/25/2020.    Goals    .  Client will talk with [name] in next 30 days about client management of health needs and clinet completion of daily activities (pt-stated)      CARE PLAN ENTRY  Current Barriers:  [name] Mobility issues of client with chronic diagnoses of CAD, Atrial Fibrillation, Diabetes, Prostate Cancer, HLD, s/p CABG X 3, OSA . Vision challenges (wears glasses )  Clinical Social Work Clinical Goal(s):  .  [name] will call client in next 30 days to talk about client management of health needs and client completion of daily activities   Interventions: . Talked with [name] about CCM program . Talked with client about RNCM support with CCM program  . Talked with client about pain issues of client (spoke of pain in his right shoulder) . Talked with client about relaxation techniques of client (likes to listen to country music) . Talked with client about social support network (has support from his daughters) . Talked with client about  transport needs of client . Talked with client about meal provision of client . Talked with client about vision challenges of client . Talked with client about anxiety/stress issues of client . Talked with client about his breathing issues . Talked with client about his decreased energy . Talked with client about ambulation needs (uses a cane to help him walk) . Talked with client about his upcomng appointment with [name] in December of 2021 . Talked with client about shortness of breath . Talked with client about his use of diuretic (client said sometimes he does not quite make it to the bathroom on time) . Talked with client about his doing outdoor activities as able . Talked with client about mood of client . Talked with client about costs of his medications (client said two of his medications were expensive to buy each month)  Patient Self Care Activities:  Does ADLs independently (uses grab bars for shower) Eats meals independently   Patient Self Care Deficits:   [name] Mobility challenges (uses a cane to help him walk) . Vision challenges (wears glasses)  Initial goal documentation     Follow Up Plan:  [name] to call client in next 4 weeks to talk with him about his management of health needs and to talk with him about his completion of daily activities  [name] MSW, [name] Licensed Clinical Social Worker [facility] Management [phone]

## 2020-06-25 NOTE — Patient Instructions (Addendum)
Licensed Clinical Social Worker Visit Information  Goals we discussed today:  .  Client will talk with [name] in next 30 days about client management of health needs and clinet completion of daily activities (pt-stated)        CARE PLAN ENTRY  Current Barriers:   Mobility issues of client with chronic diagnoses of CAD, Atrial Fibrillation, Diabetes, Prostate Cancer, HLD, s/p CABG X 3, OSA  Vision challenges (wears glasses )  Clinical Social Work Clinical Goal(s):   [name] will call client in next 30 days to talk about client management of health needs and client completion of daily activities   Interventions:  Talked with [name] about CCM program  Talked with client about RNCM support with CCM program   Talked with client about pain issues of client (spoke of pain in his right shoulder)  Talked with client about relaxation techniques of client (likes to listen to country music)  Talked with client about social support network (has support from his daughters)  Talked with client about transport needs of client  Talked with client about meal provision of client  Talked with client about vision challenges of client  Talked with client about anxiety/stress issues of client  Talked with client about his breathing issues  Talked with client about his decreased energy  Talked with client about ambulation needs (uses a cane to help him walk)  Talked with client about his upcomng appointment with Dr. Harl Bowie in December of 2021  Talked with client about shortness of breath  Talked with client about his use of diuretic (client said sometimes he does not quite make it to the bathroom on time)  Talked with client about his doing outdoor activities as able  Talked with client about mood of client  Talked with client about costs of his medications (client said two of his medications were expensive to buy each month)  Patient Self Care Activities:  Does ADLs independently  (uses grab bars for shower) Eats meals independently   Patient Self Care Deficits:    Mobility challenges (uses a cane to help him walk)  Vision challenges (wears glasses)  Initial goal documentation     Follow Up Plan:[name] to call client in next 4 weeks to talk with him about his management of health needs and to talk with him about his completion of daily activities  Materials Provided: No  The patient verbalized understanding of instructions provided today and declined a print copy of patient instruction materials.   [name].[name] MSW, [name] Licensed Clinical Social Worker [facility] [phone]

## 2020-06-29 ENCOUNTER — Ambulatory Visit: Payer: Medicare Other | Admitting: Internal Medicine

## 2020-06-29 ENCOUNTER — Encounter: Payer: Self-pay | Admitting: Internal Medicine

## 2020-06-29 VITALS — BP 118/60 | HR 60 | Ht 70.0 in | Wt 209.0 lb

## 2020-06-29 DIAGNOSIS — I10 Essential (primary) hypertension: Secondary | ICD-10-CM

## 2020-06-29 DIAGNOSIS — D6869 Other thrombophilia: Secondary | ICD-10-CM

## 2020-06-29 DIAGNOSIS — I441 Atrioventricular block, second degree: Secondary | ICD-10-CM | POA: Diagnosis not present

## 2020-06-29 DIAGNOSIS — I4819 Other persistent atrial fibrillation: Secondary | ICD-10-CM

## 2020-06-29 LAB — CUP PACEART INCLINIC DEVICE CHECK
Date Time Interrogation Session: 20211105145704
Implantable Lead Implant Date: 20200630
Implantable Lead Implant Date: 20200630
Implantable Lead Location: 753859
Implantable Lead Location: 753860
Implantable Pulse Generator Implant Date: 20200630
Pulse Gen Model: 2272
Pulse Gen Serial Number: 3311958

## 2020-06-29 MED ORDER — SACUBITRIL-VALSARTAN 24-26 MG PO TABS: 1.0000 | ORAL_TABLET | Freq: Two times a day (BID) | ORAL | 0 refills | Status: DC
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
## 2020-06-29 NOTE — Addendum Note (Signed)
Addended by: Merlene Laughter on: 06/29/2020 12:38 PM   Modules accepted: Orders

## 2020-06-29 NOTE — Patient Instructions (Addendum)
Medication Instructions:   Your physician recommends that you continue on your current medications as directed. Please refer to the Current Medication list given to you today.  Labwork:  None  Testing/Procedures:  None  Follow-Up:  Your physician recommends that you schedule a follow-up appointment in: 1 year.  Any Other Special Instructions Will Be Listed Below (If Applicable).  If you need a refill on your cardiac medications before your next appointment, please call your pharmacy. 

## 2020-06-30 ENCOUNTER — Emergency Department (HOSPITAL_COMMUNITY): Payer: Medicare Other

## 2020-06-30 ENCOUNTER — Other Ambulatory Visit: Payer: Self-pay

## 2020-06-30 ENCOUNTER — Encounter (HOSPITAL_COMMUNITY): Payer: Self-pay | Admitting: *Deleted

## 2020-06-30 ENCOUNTER — Emergency Department (HOSPITAL_COMMUNITY)
Admission: EM | Admit: 2020-06-30 | Discharge: 2020-06-30 | Disposition: A | Discharge status: Home / Self Care | Payer: Medicare Other | Attending: Emergency Medicine | Admitting: Emergency Medicine

## 2020-06-30 DIAGNOSIS — Z7901 Long term (current) use of anticoagulants: Secondary | ICD-10-CM | POA: Insufficient documentation

## 2020-06-30 DIAGNOSIS — I2510 Atherosclerotic heart disease of native coronary artery without angina pectoris: Secondary | ICD-10-CM | POA: Diagnosis not present

## 2020-06-30 DIAGNOSIS — J449 Chronic obstructive pulmonary disease, unspecified: Secondary | ICD-10-CM | POA: Diagnosis not present

## 2020-06-30 DIAGNOSIS — Z87891 Personal history of nicotine dependence: Secondary | ICD-10-CM | POA: Insufficient documentation

## 2020-06-30 DIAGNOSIS — R109 Unspecified abdominal pain: Secondary | ICD-10-CM | POA: Insufficient documentation

## 2020-06-30 DIAGNOSIS — J45909 Unspecified asthma, uncomplicated: Secondary | ICD-10-CM | POA: Diagnosis not present

## 2020-06-30 DIAGNOSIS — Z8546 Personal history of malignant neoplasm of prostate: Secondary | ICD-10-CM | POA: Diagnosis not present

## 2020-06-30 DIAGNOSIS — R1111 Vomiting without nausea: Secondary | ICD-10-CM | POA: Diagnosis not present

## 2020-06-30 DIAGNOSIS — I119 Hypertensive heart disease without heart failure: Secondary | ICD-10-CM | POA: Insufficient documentation

## 2020-06-30 DIAGNOSIS — N1330 Unspecified hydronephrosis: Secondary | ICD-10-CM | POA: Diagnosis not present

## 2020-06-30 DIAGNOSIS — I358 Other nonrheumatic aortic valve disorders: Secondary | ICD-10-CM | POA: Diagnosis not present

## 2020-06-30 DIAGNOSIS — Z8673 Personal history of transient ischemic attack (TIA), and cerebral infarction without residual deficits: Secondary | ICD-10-CM | POA: Insufficient documentation

## 2020-06-30 DIAGNOSIS — R310 Gross hematuria: Secondary | ICD-10-CM | POA: Insufficient documentation

## 2020-06-30 DIAGNOSIS — N200 Calculus of kidney: Secondary | ICD-10-CM | POA: Insufficient documentation

## 2020-06-30 DIAGNOSIS — R319 Hematuria, unspecified: Secondary | ICD-10-CM | POA: Diagnosis present

## 2020-06-30 DIAGNOSIS — Z951 Presence of aortocoronary bypass graft: Secondary | ICD-10-CM | POA: Insufficient documentation

## 2020-06-30 DIAGNOSIS — N202 Calculus of kidney with calculus of ureter: Secondary | ICD-10-CM | POA: Diagnosis not present

## 2020-06-30 DIAGNOSIS — E119 Type 2 diabetes mellitus without complications: Secondary | ICD-10-CM | POA: Diagnosis not present

## 2020-06-30 LAB — CBC WITH DIFFERENTIAL/PLATELET
Abs Immature Granulocytes: 0.03 10*3/uL (ref 0.00–0.07)
Basophils Absolute: 0 10*3/uL (ref 0.0–0.1)
Basophils Relative: 0 %
Eosinophils Absolute: 0 10*3/uL (ref 0.0–0.5)
Eosinophils Relative: 1 %
HCT: 38.4 % — ABNORMAL LOW (ref 39.0–52.0)
Hemoglobin: 12.3 g/dL — ABNORMAL LOW (ref 13.0–17.0)
Immature Granulocytes: 1 %
Lymphocytes Relative: 11 %
Lymphs Abs: 0.7 10*3/uL (ref 0.7–4.0)
MCH: 32.8 pg (ref 26.0–34.0)
MCHC: 32 g/dL (ref 30.0–36.0)
MCV: 102.4 fL — ABNORMAL HIGH (ref 80.0–100.0)
Monocytes Absolute: 0.7 10*3/uL (ref 0.1–1.0)
Monocytes Relative: 11 %
Neutro Abs: 5 10*3/uL (ref 1.7–7.7)
Neutrophils Relative %: 76 %
Platelets: 73 10*3/uL — ABNORMAL LOW (ref 150–400)
RBC: 3.75 MIL/uL — ABNORMAL LOW (ref 4.22–5.81)
RDW: 15.9 % — ABNORMAL HIGH (ref 11.5–15.5)
WBC: 6.5 10*3/uL (ref 4.0–10.5)
nRBC: 0 % (ref 0.0–0.2)

## 2020-06-30 LAB — URINALYSIS, ROUTINE W REFLEX MICROSCOPIC
Bilirubin Urine: NEGATIVE
Glucose, UA: NEGATIVE mg/dL
Ketones, ur: NEGATIVE mg/dL
Leukocytes,Ua: NEGATIVE
Nitrite: NEGATIVE
Protein, ur: 100 mg/dL — AB
RBC / HPF: >50 RBC/hpf — ABNORMAL HIGH (ref 0–5)
Specific Gravity, Urine: 1.026 (ref 1.005–1.030)
pH: 7 (ref 5.0–8.0)

## 2020-06-30 LAB — COMPREHENSIVE METABOLIC PANEL
ALT: 20 U/L (ref 0–44)
AST: 21 U/L (ref 15–41)
Albumin: 3.7 g/dL (ref 3.5–5.0)
Alkaline Phosphatase: 71 U/L (ref 38–126)
Anion gap: 8 (ref 5–15)
BUN: 17 mg/dL (ref 8–23)
CO2: 29 mmol/L (ref 22–32)
Calcium: 9.1 mg/dL (ref 8.9–10.3)
Chloride: 102 mmol/L (ref 98–111)
Creatinine, Ser: 1.26 mg/dL — ABNORMAL HIGH (ref 0.61–1.24)
GFR, Estimated: 56 mL/min — ABNORMAL LOW (ref >60)
Glucose, Bld: 172 mg/dL — ABNORMAL HIGH (ref 70–99)
Potassium: 4 mmol/L (ref 3.5–5.1)
Sodium: 139 mmol/L (ref 135–145)
Total Bilirubin: 1.4 mg/dL — ABNORMAL HIGH (ref 0.3–1.2)
Total Protein: 6.2 g/dL — ABNORMAL LOW (ref 6.5–8.1)

## 2020-06-30 LAB — LIPASE, BLOOD: Lipase: 39 U/L (ref 11–51)

## 2020-06-30 MED ORDER — TAMSULOSIN HCL 0.4 MG PO CAPS: 0.4000 mg | ORAL_CAPSULE | Freq: Every day | ORAL | 0 refills | Status: AC

## 2020-06-30 MED ORDER — ONDANSETRON HCL 4 MG/2ML IJ SOLN
4.0000 mg | Freq: Once | INTRAMUSCULAR | Status: AC
  Administered 2020-06-30: 4 mg via INTRAVENOUS
  Filled 2020-06-30: qty 2

## 2020-06-30 MED ORDER — OXYCODONE-ACETAMINOPHEN 5-325 MG PO TABS
0.5000 | ORAL_TABLET | Freq: Four times a day (QID) | ORAL | 0 refills | Status: DC | PRN
Start: 2020-06-30 — End: 2020-07-04

## 2020-06-30 MED ORDER — MORPHINE SULFATE (PF) 4 MG/ML IV SOLN
4.0000 mg | Freq: Once | INTRAVENOUS | Status: AC
  Administered 2020-06-30: 4 mg via INTRAVENOUS
  Filled 2020-06-30: qty 1

## 2020-06-30 MED ORDER — SENNOSIDES-DOCUSATE SODIUM 8.6-50 MG PO TABS: 1.0000 | ORAL_TABLET | Freq: Every evening | ORAL | 0 refills | Status: DC | PRN

## 2020-06-30 NOTE — ED Notes (Signed)
Pt stated he voided this morning before arriving, bladder scan was 56ml

## 2020-06-30 NOTE — Discharge Instructions (Signed)
You were seen in the emergency department today with pain in your lower abdomen and back.  We found 2 kidney stones on each side of your bladder.  These appear to be close to passing but may need some additional time.  Please follow closely with the urologist listed by calling the office first thing Monday to schedule a follow-up appointment.  I have called him several medications to your pharmacy including pain medications.  Return to the emergency department if you develop inability to urinate for more than 8 hours or if you develop severe pain, fever, chills, or other sudden/severe symptoms.

## 2020-06-30 NOTE — ED Triage Notes (Signed)
Pain in left flank radiating into groin area, passing blood in urine
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
## 2020-07-02 LAB — URINE CULTURE: Culture: <10000 — AB

## 2020-07-04 ENCOUNTER — Ambulatory Visit (INDEPENDENT_AMBULATORY_CARE_PROVIDER_SITE_OTHER): Payer: Medicare Other | Admitting: Urology

## 2020-07-04 ENCOUNTER — Encounter: Payer: Self-pay | Admitting: Urology

## 2020-07-04 ENCOUNTER — Other Ambulatory Visit: Payer: Self-pay | Admitting: Cardiology

## 2020-07-04 ENCOUNTER — Other Ambulatory Visit: Payer: Self-pay

## 2020-07-04 VITALS — BP 112/65 | HR 77 | Temp 97.9°F | Ht 70.0 in | Wt 211.0 lb

## 2020-07-04 DIAGNOSIS — N200 Calculus of kidney: Secondary | ICD-10-CM | POA: Insufficient documentation

## 2020-07-04 LAB — URINALYSIS, ROUTINE W REFLEX MICROSCOPIC
Bilirubin, UA: NEGATIVE
Glucose, UA: NEGATIVE
Nitrite, UA: NEGATIVE
Specific Gravity, UA: 1.025 (ref 1.005–1.030)
Urobilinogen, Ur: 2 mg/dL — ABNORMAL HIGH (ref 0.2–1.0)
pH, UA: 5 (ref 5.0–7.5)

## 2020-07-04 LAB — MICROSCOPIC EXAMINATION
Bacteria, UA: NONE SEEN
RBC, Urine: >30 /hpf — AB (ref 0–2)
Renal Epithel, UA: NONE SEEN /hpf

## 2020-07-04 MED ORDER — OXYCODONE-ACETAMINOPHEN 5-325 MG PO TABS: 0.5000 | ORAL_TABLET | ORAL | 0 refills | Status: DC | PRN
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
## 2020-07-04 NOTE — Progress Notes (Signed)
Urological Symptom Review  Patient is experiencing the following symptoms: Blood in urine   Review of Systems  Gastrointestinal (upper)  : Negative for upper GI symptoms  Gastrointestinal (lower) : Negative for lower GI symptoms  Constitutional : Negative for symptoms  Skin: Negative for skin symptoms  Eyes: Negative for eye symptoms  Ear/Nose/Throat : Negative for Ear/Nose/Throat symptoms  Hematologic/Lymphatic: Brusing easily  Cardiovascular : Leg swelling  Respiratory : Negative for respiratory symptoms  Endocrine: Negative for endocrine symptoms  Musculoskeletal: Negative for musculoskeletal symptoms  Neurological: Negative for neurological symptoms  Psychologic: Negative for psychiatric symptoms

## 2020-07-04 NOTE — Patient Instructions (Signed)
[name]  07/04/2020     @PREFPERIOPPHARMACY @   Your procedure is scheduled on  07/10/2020.  Report to [facility] at  1030  A.M.  Call this number if you have problems the morning of surgery:  [phone]   Remember:  Do not eat or drink after midnight.                     Take these medicines the morning of surgery with A SIP OF WATER  Amiodarone, amlodipine, antivert(if needed), metoprolol, prilosec, entresto, flomax. Use your inhaler before you come and bring your rescue inhaler with you. DO NOT take any medications for diabetes the morning of your procedure.    Do not wear jewelry, make-up or nail polish.  Do not wear lotions, powders, or perfumes. Please wear deodorant and brush your teeth.  Do not shave 48 hours prior to surgery.  Men may shave face and neck.  Do not bring valuables to the hospital.  [facility] is not responsible for any belongings or valuables.  Contacts, dentures or bridgework may not be worn into surgery.  Leave your suitcase in the car.  After surgery it may be brought to your room.  For patients admitted to the hospital, discharge time will be determined by your treatment team.  Patients discharged the day of surgery will not be allowed to drive home.   Name and phone number of your driver:   family Special instructions:   DO NOT smoke the morning of you procedure.  Please read over the following fact sheets that you were given. Anesthesia Post-op Instructions and Care and Recovery After Surgery       Ureteral Stent Implantation, Care After This sheet gives you information about how to care for yourself after your procedure. Your health care provider may also give you more specific instructions. If you have problems or questions, contact your health care provider. What can I expect after the procedure? After the procedure, it is common to have:  Nausea.  Mild pain when you urinate. You may feel this pain in your lower  back or lower abdomen. The pain should stop within a few minutes after you urinate. This may last for up to 1 week.  A small amount of blood in your urine for several days. Follow these instructions at home: Medicines  Take over-the-counter and prescription medicines only as told by your health care provider.  If you were prescribed an antibiotic medicine, take it as told by your health care provider. Do not stop taking the antibiotic even if you start to feel better.  Do not drive for 24 hours if you were given a sedative during your procedure.  Ask your health care provider if the medicine prescribed to you requires you to avoid driving or using heavy machinery. Activity  Rest as told by your health care provider.  Avoid sitting for a long time without moving. Get up to take short walks every 1-2 hours. This is important to improve blood flow and breathing. Ask for help if you feel weak or unsteady.  Return to your normal activities as told by your health care provider. Ask your health care provider what activities are safe for you. General instructions   Watch for any blood in your urine. Call your health care provider if the amount of blood in your urine increases.  If you have a catheter: ? Follow instructions from your health  care provider about taking care of your catheter and collection bag. ? Do not take baths, swim, or use a hot tub until your health care provider approves. Ask your health care provider if you may take showers. You may only be allowed to take sponge baths.  Drink enough fluid to keep your urine pale yellow.  Do not use any products that contain nicotine or tobacco, such as cigarettes, e-cigarettes, and chewing tobacco. These can delay healing after surgery. If you need help quitting, ask your health care provider.  Keep all follow-up visits as told by your health care provider. This is important. Contact a health care provider if:  You have pain that  gets worse or does not get better with medicine, especially pain when you urinate.  You have difficulty urinating.  You feel nauseous or you vomit repeatedly during a period of more than 2 days after the procedure. Get help right away if:  Your urine is dark red or has blood clots in it.  You are leaking urine (have incontinence).  The end of the stent comes out of your urethra.  You cannot urinate.  You have sudden, sharp, or severe pain in your abdomen or lower back.  You have a fever.  You have swelling or pain in your legs.  You have difficulty breathing. Summary  After the procedure, it is common to have mild pain when you urinate that goes away within a few minutes after you urinate. This may last for up to 1 week.  Watch for any blood in your urine. Call your health care provider if the amount of blood in your urine increases.  Take over-the-counter and prescription medicines only as told by your health care provider.  Drink enough fluid to keep your urine pale yellow. This information is not intended to replace advice given to you by your health care provider. Make sure you discuss any questions you have with your health care provider. Document Revised: 05/18/2018 Document Reviewed: 05/19/2018 Elsevier Patient Education  [place].  Ureteroscopy Ureteroscopy is a procedure to check for and treat problems inside part of the urinary tract. In this procedure, a thin, tube-shaped instrument with a light at the end (ureteroscope) is used to look at the inside of the kidneys and the ureters, which are the tubes that carry urine from the kidneys to the bladder. The ureteroscope is inserted into one or both of the ureters. You may need this procedure if you have frequent urinary tract infections (UTIs), blood in your urine, or a stone in one of your ureters. A ureteroscopy can be done to find the cause of urine blockage in a ureter and to evaluate other abnormalities  inside the ureters or kidneys. If stones are found, they can be removed during the procedure. Polyps, abnormal tissue, and some types of tumors can also be removed or treated. The ureteroscope may also have a tool to remove tissue to be checked for disease under a microscope (biopsy). Tell a health care provider about:  Any allergies you have.  All medicines you are taking, including vitamins, herbs, eye drops, creams, and over-the-counter medicines.  Any problems you or family members have had with anesthetic medicines.  Any blood disorders you have.  Any surgeries you have had.  Any medical conditions you have.  Whether you are pregnant or may be pregnant. What are the risks? Generally, this is a safe procedure. However, problems may occur, including:  Bleeding.  Infection.  Allergic reactions  to medicines.  Scarring that narrows the ureter (stricture).  Creating a hole in the ureter (perforation). What happens before the procedure? Staying hydrated Follow instructions from your health care provider about hydration, which may include:  Up to 2 hours before the procedure - you may continue to drink clear liquids, such as water, clear fruit juice, black coffee, and plain tea. Eating and drinking restrictions Follow instructions from your health care provider about eating and drinking, which may include:  8 hours before the procedure - stop eating heavy meals or foods such as meat, fried foods, or fatty foods.  6 hours before the procedure - stop eating light meals or foods, such as toast or cereal.  6 hours before the procedure - stop drinking milk or drinks that contain milk.  2 hours before the procedure - stop drinking clear liquids. Medicines  Ask your health care provider about: ? Changing or stopping your regular medicines. This is especially important if you are taking diabetes medicines or blood thinners. ? Taking medicines such as aspirin and ibuprofen. These  medicines can thin your blood. Do not take these medicines before your procedure if your health care provider instructs you not to.  You may be given antibiotic medicine to help prevent infection. General instructions  You may have a urine sample taken to check for infection.  Plan to have someone take you home from the hospital or clinic. What happens during the procedure?   To reduce your risk of infection: ? Your health care team will wash or sanitize their hands. ? Your skin will be washed with soap.  An IV tube will be inserted into one of your veins.  You will be given one of the following: ? A medicine to help you relax (sedative). ? A medicine to make you fall asleep (general anesthetic). ? A medicine that is injected into your spine to numb the area below and slightly above the injection site (spinal anesthetic).  To lower your risk of infection, you may be given an antibiotic medicine by an injection or through the IV tube.  The opening from which you urinate (urethra) will be cleaned with a germ-killing solution.  The ureteroscope will be passed through your urethra into your bladder.  A salt-water solution will flow through the ureteroscope to fill your bladder. This will help the health care provider see the openings of your ureters more clearly.  Then, the ureteroscope will be passed into your ureter. ? If a growth is found, a piece of it may be removed so it can be examined under a microscope (biopsy). ? If a stone is found, it may be removed through the ureteroscope, or the stone may be broken up using a laser, shock waves, or electrical energy. ? In some cases, if the ureter is too small, a tube may be inserted that keeps the ureter open (ureteral stent). The stent may be left in place for 1 or 2 weeks to keep the ureter open, and then the ureteroscopy procedure will be performed.  The scope will be removed, and your bladder will be emptied. The procedure may vary  among health care providers and hospitals. What happens after the procedure?  Your blood pressure, heart rate, breathing rate, and blood oxygen level will be monitored until the medicines you were given have worn off.  You may be asked to urinate.  Donot drive for 24 hours if you were given a sedative. This information is not intended to replace advice  given to you by your health care provider. Make sure you discuss any questions you have with your health care provider. Document Revised: 07/24/2017 Document Reviewed: 05/23/2016 Elsevier Patient Education  2020 [place] Anesthesia, Adult, Care After This sheet gives you information about how to care for yourself after your procedure. Your health care provider may also give you more specific instructions. If you have problems or questions, contact your health care provider. What can I expect after the procedure? After the procedure, the following side effects are common:  Pain or discomfort at the IV site.  Nausea.  Vomiting.  Sore throat.  Trouble concentrating.  Feeling cold or chills.  Weak or tired.  Sleepiness and fatigue.  Soreness and body aches. These side effects can affect parts of the body that were not involved in surgery. Follow these instructions at home:  For at least 24 hours after the procedure:  Have a responsible adult stay with you. It is important to have someone help care for you until you are awake and alert.  Rest as needed.  Do not: ? Participate in activities in which you could fall or become injured. ? Drive. ? Use heavy machinery. ? Drink alcohol. ? Take sleeping pills or medicines that cause drowsiness. ? Make important decisions or sign legal documents. ? Take care of children on your own. Eating and drinking  Follow any instructions from your health care provider about eating or drinking restrictions.  When you feel hungry, start by eating small amounts of foods that are  soft and easy to digest (bland), such as toast. Gradually return to your regular diet.  Drink enough fluid to keep your urine pale yellow.  If you vomit, rehydrate by drinking water, juice, or clear broth. General instructions  If you have sleep apnea, surgery and certain medicines can increase your risk for breathing problems. Follow instructions from your health care provider about wearing your sleep device: ? Anytime you are sleeping, including during daytime naps. ? While taking prescription pain medicines, sleeping medicines, or medicines that make you drowsy.  Return to your normal activities as told by your health care provider. Ask your health care provider what activities are safe for you.  Take over-the-counter and prescription medicines only as told by your health care provider.  If you smoke, do not smoke without supervision.  Keep all follow-up visits as told by your health care provider. This is important. Contact a health care provider if:  You have nausea or vomiting that does not get better with medicine.  You cannot eat or drink without vomiting.  You have pain that does not get better with medicine.  You are unable to pass urine.  You develop a skin rash.  You have a fever.  You have redness around your IV site that gets worse. Get help right away if:  You have difficulty breathing.  You have chest pain.  You have blood in your urine or stool, or you vomit blood. Summary  After the procedure, it is common to have a sore throat or nausea. It is also common to feel tired.  Have a responsible adult stay with you for the first 24 hours after general anesthesia. It is important to have someone help care for you until you are awake and alert.  When you feel hungry, start by eating small amounts of foods that are soft and easy to digest (bland), such as toast. Gradually return to your regular diet.  Drink enough fluid  to keep your urine pale  yellow.  Return to your normal activities as told by your health care provider. Ask your health care provider what activities are safe for you. This information is not intended to replace advice given to you by your health care provider. Make sure you discuss any questions you have with your health care provider. Document Revised: 08/14/2017 Document Reviewed: 03/27/2017 Elsevier Patient Education  [place].

## 2020-07-04 NOTE — Patient Instructions (Addendum)
You tentative surgery date is November 17 at [facility]. The hospital will call you with your pre op appointment and covid test.      Kidney Stones Kidney stones are rock-like masses that form inside of the kidneys. Kidneys are organs that make pee (urine). A kidney stone may move into other parts of the urinary tract, including:  The tubes that connect the kidneys to the bladder (ureters).  The bladder.  The tube that carries urine out of the body (urethra). Kidney stones can cause very bad pain and can block the flow of pee. The stone usually leaves your body (passes) through your pee. You may need to have a doctor take out the stone. What are the causes? Kidney stones may be caused by:  A condition in which certain glands make too much parathyroid hormone (primary hyperparathyroidism).  A buildup of a type of crystals in the bladder made of a chemical called uric acid. The body makes uric acid when you eat certain foods.  Narrowing (stricture) of one or both of the ureters.  A kidney blockage that you were born with.  Past surgery on the kidney or the ureters, such as gastric bypass surgery. What increases the risk? You are more likely to develop this condition if:  You have had a kidney stone in the past.  You have a family history of kidney stones.  You do not drink enough water.  You eat a diet that is high in protein, salt (sodium), or sugar.  You are overweight or very overweight (obese). What are the signs or symptoms? Symptoms of a kidney stone may include:  Pain in the side of the belly, right below the ribs (flank pain). Pain usually spreads (radiates) to the groin.  Needing to pee often or right away (urgently).  Pain when going pee (urinating).  Blood in your pee (hematuria).  Feeling like you may vomit (nauseous).  Vomiting.  Fever and chills. How is this treated? Treatment depends on the size, location, and makeup of the kidney stones.  The stones will often pass out of the body through peeing. You may need to:  Drink more fluid to help pass the stone. In some cases, you may be given fluids through an IV tube put into one of your veins at the hospital.  Take medicine for pain.  Make changes in your diet to help keep kidney stones from coming back. Sometimes, medical procedures are needed to remove a kidney stone. This may involve:  A procedure to break up kidney stones using a beam of light (laser) or shock waves.  Surgery to remove the kidney stones. Follow these instructions at home: Medicines  Take over-the-counter and prescription medicines only as told by your doctor.  Ask your doctor if the medicine prescribed to you requires you to avoid driving or using heavy machinery. Eating and drinking  Drink enough fluid to keep your pee pale yellow. You may be told to drink at least 8-10 glasses of water each day. This will help you pass the stone.  If told by your doctor, change your diet. This may include: ? Limiting how much salt you eat. ? Eating more fruits and vegetables. ? Limiting how much meat, poultry, fish, and eggs you eat.  Follow instructions from your doctor about eating or drinking restrictions. General instructions  Collect pee samples as told by your doctor. You may need to collect a pee sample: ? 24 hours after a stone comes out. ?  8-12 weeks after a stone comes out, and every 6-12 months after that.  Strain your pee every time you pee (urinate), for as long as told. Use the strainer that your doctor recommends.  Do not throw out the stone. Keep it so that it can be tested by your doctor.  Keep all follow-up visits as told by your doctor. This is important. You may need follow-up tests. How is this prevented? To prevent another kidney stone:  Drink enough fluid to keep your pee pale yellow. This is the best way to prevent kidney stones.  Eat healthy foods.  Avoid certain foods as told by  your doctor. You may be told to eat less protein.  Stay at a healthy weight. Where to find more information  [place] ([facility]): www.kidney.[facility]): www.urologyhealth.org Contact a doctor if:  You have pain that gets worse or does not get better with medicine. Get help right away if:  You have a fever or chills.  You get very bad pain.  You get new pain in your belly (abdomen).  You pass out (faint).  You cannot pee. Summary  Kidney stones are rock-like masses that form inside of the kidneys.  Kidney stones can cause very bad pain and can block the flow of pee.  The stones will often pass out of the body through peeing.  Drink enough fluid to keep your pee pale yellow. This information is not intended to replace advice given to you by your health care provider. Make sure you discuss any questions you have with your health care provider. Document Revised: 12/28/2018 Document Reviewed: 12/28/2018 Elsevier Patient Education  [place].

## 2020-07-06 ENCOUNTER — Other Ambulatory Visit (HOSPITAL_COMMUNITY)
Admission: RE | Admit: 2020-07-06 | Discharge: 2020-07-06 | Disposition: A | Discharge status: Home / Self Care | Payer: Medicare Other | Source: Ambulatory Visit | Attending: Urology | Admitting: Urology

## 2020-07-06 ENCOUNTER — Encounter (HOSPITAL_COMMUNITY)
Admission: RE | Admit: 2020-07-06 | Discharge: 2020-07-06 | Disposition: A | Discharge status: Home / Self Care | Payer: Medicare Other | Source: Ambulatory Visit | Attending: Urology | Admitting: Urology

## 2020-07-06 ENCOUNTER — Other Ambulatory Visit: Payer: Self-pay

## 2020-07-06 DIAGNOSIS — Z20822 Contact with and (suspected) exposure to covid-19: Secondary | ICD-10-CM | POA: Insufficient documentation

## 2020-07-06 DIAGNOSIS — Z01812 Encounter for preprocedural laboratory examination: Secondary | ICD-10-CM | POA: Insufficient documentation

## 2020-07-06 LAB — SARS CORONAVIRUS 2 (TAT 6-24 HRS): SARS Coronavirus 2: NEGATIVE

## 2020-07-06 NOTE — Progress Notes (Signed)
Mr Dom daughter, Anselmo Pickler stated Her father was to continue his Eliquis per Dr Alyson Ingles

## 2020-07-10 ENCOUNTER — Ambulatory Visit (HOSPITAL_COMMUNITY)
Admission: RE | Admit: 2020-07-10 | Discharge: 2020-07-10 | Disposition: A | Discharge status: Home / Self Care | Payer: Medicare Other | Attending: Urology | Admitting: Urology

## 2020-07-10 ENCOUNTER — Ambulatory Visit (HOSPITAL_COMMUNITY): Payer: Medicare Other | Admitting: Anesthesiology

## 2020-07-10 ENCOUNTER — Encounter (HOSPITAL_COMMUNITY): Payer: Self-pay | Admitting: Urology

## 2020-07-10 ENCOUNTER — Encounter (HOSPITAL_COMMUNITY)
Admission: RE | Disposition: A | Discharge status: Home / Self Care | Payer: Self-pay | Source: Home / Self Care | Attending: Urology

## 2020-07-10 ENCOUNTER — Ambulatory Visit (HOSPITAL_COMMUNITY): Payer: Medicare Other

## 2020-07-10 DIAGNOSIS — N201 Calculus of ureter: Secondary | ICD-10-CM | POA: Diagnosis not present

## 2020-07-10 DIAGNOSIS — Z87891 Personal history of nicotine dependence: Secondary | ICD-10-CM | POA: Insufficient documentation

## 2020-07-10 DIAGNOSIS — I10 Essential (primary) hypertension: Secondary | ICD-10-CM | POA: Diagnosis not present

## 2020-07-10 DIAGNOSIS — J449 Chronic obstructive pulmonary disease, unspecified: Secondary | ICD-10-CM | POA: Diagnosis not present

## 2020-07-10 DIAGNOSIS — N130 Hydronephrosis with ureteropelvic junction obstruction: Secondary | ICD-10-CM | POA: Diagnosis not present

## 2020-07-10 DIAGNOSIS — Z951 Presence of aortocoronary bypass graft: Secondary | ICD-10-CM | POA: Diagnosis not present

## 2020-07-10 DIAGNOSIS — Z950 Presence of cardiac pacemaker: Secondary | ICD-10-CM | POA: Diagnosis not present

## 2020-07-10 DIAGNOSIS — N200 Calculus of kidney: Secondary | ICD-10-CM

## 2020-07-10 DIAGNOSIS — Z96652 Presence of left artificial knee joint: Secondary | ICD-10-CM | POA: Insufficient documentation

## 2020-07-10 DIAGNOSIS — Z888 Allergy status to other drugs, medicaments and biological substances status: Secondary | ICD-10-CM | POA: Insufficient documentation

## 2020-07-10 DIAGNOSIS — I4891 Unspecified atrial fibrillation: Secondary | ICD-10-CM | POA: Diagnosis not present

## 2020-07-10 DIAGNOSIS — E119 Type 2 diabetes mellitus without complications: Secondary | ICD-10-CM | POA: Diagnosis not present

## 2020-07-10 DIAGNOSIS — N2889 Other specified disorders of kidney and ureter: Secondary | ICD-10-CM | POA: Diagnosis not present

## 2020-07-10 HISTORY — PX: HOLMIUM LASER APPLICATION: SHX5852

## 2020-07-10 HISTORY — PX: LITHOTRIPSY: SUR834

## 2020-07-10 HISTORY — PX: CYSTOSCOPY/RETROGRADE/URETEROSCOPY: SHX5316

## 2020-07-10 HISTORY — PX: CYSTOSCOPY WITH STENT PLACEMENT: SHX5790

## 2020-07-10 LAB — GLUCOSE, CAPILLARY: Glucose-Capillary: 113 mg/dL — ABNORMAL HIGH (ref 70–99)

## 2020-07-10 SURGERY — CYSTOSCOPY/RETROGRADE/URETEROSCOPY
Anesthesia: General | Site: Ureter | Laterality: Right

## 2020-07-10 MED ORDER — FENTANYL CITRATE (PF) 100 MCG/2ML IJ SOLN
INTRAMUSCULAR | Status: AC
  Filled 2020-07-10: qty 2

## 2020-07-10 MED ORDER — LACTATED RINGERS IV SOLN: Freq: Once | INTRAVENOUS | Status: AC

## 2020-07-10 MED ORDER — PROPOFOL 10 MG/ML IV BOLUS
INTRAVENOUS | Status: DC | PRN
  Administered 2020-07-10: 75 mg via INTRAVENOUS

## 2020-07-10 MED ORDER — SODIUM CHLORIDE 0.9 % IR SOLN
Status: DC | PRN
  Administered 2020-07-10 (×2): 3000 mL

## 2020-07-10 MED ORDER — CHLORHEXIDINE GLUCONATE 0.12 % MT SOLN
15.0000 mL | Freq: Once | OROMUCOSAL | Status: AC
  Administered 2020-07-10: 15 mL via OROMUCOSAL
  Filled 2020-07-10: qty 15

## 2020-07-10 MED ORDER — FENTANYL CITRATE (PF) 100 MCG/2ML IJ SOLN
INTRAMUSCULAR | Status: DC | PRN
  Administered 2020-07-10 (×2): 50 ug via INTRAVENOUS

## 2020-07-10 MED ORDER — CEFAZOLIN SODIUM-DEXTROSE 2-4 GM/100ML-% IV SOLN
2.0000 g | INTRAVENOUS | Status: AC
  Administered 2020-07-10: 2 g via INTRAVENOUS
  Filled 2020-07-10: qty 100

## 2020-07-10 MED ORDER — WATER FOR IRRIGATION, STERILE IR SOLN
Status: DC | PRN
  Administered 2020-07-10: 500 mL

## 2020-07-10 MED ORDER — DIATRIZOATE MEGLUMINE 30 % UR SOLN
URETHRAL | Status: DC | PRN
  Administered 2020-07-10: 17 mL via URETHRAL

## 2020-07-10 MED ORDER — ONDANSETRON HCL 4 MG/2ML IJ SOLN: 4.0000 mg | Freq: Once | INTRAMUSCULAR | Status: DC | PRN

## 2020-07-10 MED ORDER — DIATRIZOATE MEGLUMINE 30 % UR SOLN
URETHRAL | Status: AC
  Filled 2020-07-10: qty 100

## 2020-07-10 MED ORDER — ONDANSETRON HCL 4 MG/2ML IJ SOLN
INTRAMUSCULAR | Status: DC | PRN
  Administered 2020-07-10: 4 mg via INTRAVENOUS

## 2020-07-10 MED ORDER — MEPERIDINE HCL 50 MG/ML IJ SOLN: 6.2500 mg | INTRAMUSCULAR | Status: DC | PRN

## 2020-07-10 MED ORDER — PROPOFOL 10 MG/ML IV BOLUS
INTRAVENOUS | Status: AC
  Filled 2020-07-10: qty 20

## 2020-07-10 MED ORDER — OXYCODONE-ACETAMINOPHEN 5-325 MG PO TABS: 0.5000 | ORAL_TABLET | ORAL | 0 refills | Status: DC | PRN

## 2020-07-10 MED ORDER — ORAL CARE MOUTH RINSE: 15.0000 mL | Freq: Once | OROMUCOSAL | Status: AC

## 2020-07-10 MED ORDER — ETOMIDATE 2 MG/ML IV SOLN
INTRAVENOUS | Status: DC | PRN
  Administered 2020-07-10: 15 mg via INTRAVENOUS

## 2020-07-10 MED ORDER — LIDOCAINE HCL (CARDIAC) PF 100 MG/5ML IV SOSY
PREFILLED_SYRINGE | INTRAVENOUS | Status: DC | PRN
  Administered 2020-07-10: 60 mg via INTRAVENOUS

## 2020-07-10 MED ORDER — LACTATED RINGERS IV SOLN: INTRAVENOUS | Status: DC | PRN

## 2020-07-10 MED ORDER — FENTANYL CITRATE (PF) 100 MCG/2ML IJ SOLN: 25.0000 ug | INTRAMUSCULAR | Status: DC | PRN

## 2020-07-10 SURGICAL SUPPLY — 26 items
BAG DRAIN URO TABLE W/ADPT NS (BAG) ×3 IMPLANT
BAG DRN 8 ADPR NS SKTRN CSTL (BAG) ×2
BAG HAMPER (MISCELLANEOUS) ×3 IMPLANT
CATH INTERMIT  6FR 70CM (CATHETERS) ×3 IMPLANT
CLOTH BEACON ORANGE TIMEOUT ST (SAFETY) ×3 IMPLANT
DECANTER SPIKE VIAL GLASS SM (MISCELLANEOUS) ×3 IMPLANT
EXTRACTOR STONE NITINOL NGAGE (UROLOGICAL SUPPLIES) ×1 IMPLANT
FIBER LASER FLEXIVA 200 (UROLOGICAL SUPPLIES) ×1 IMPLANT
GLOVE BIO SURGEON STRL SZ8 (GLOVE) ×3 IMPLANT
GLOVE BIOGEL PI IND STRL 7.0 (GLOVE) ×6 IMPLANT
GLOVE BIOGEL PI INDICATOR 7.0 (GLOVE) ×3
GLOVE ECLIPSE 6.5 STRL STRAW (GLOVE) ×1 IMPLANT
GOWN STRL REUS W/ TWL XL LVL3 (GOWN DISPOSABLE) IMPLANT
GOWN STRL REUS W/TWL LRG LVL3 (GOWN DISPOSABLE) ×3 IMPLANT
GOWN STRL REUS W/TWL XL LVL3 (GOWN DISPOSABLE) ×6 IMPLANT
GUIDEWIRE STR DUAL SENSOR (WIRE) ×3 IMPLANT
GUIDEWIRE STR ZIPWIRE 035X150 (MISCELLANEOUS) ×3 IMPLANT
IV NS IRRIG 3000ML ARTHROMATIC (IV SOLUTION) ×6 IMPLANT
KIT TURNOVER CYSTO (KITS) ×3 IMPLANT
MANIFOLD NEPTUNE II (INSTRUMENTS) ×3 IMPLANT
PACK CYSTO (CUSTOM PROCEDURE TRAY) ×3 IMPLANT
PAD ARMBOARD 7.5X6 YLW CONV (MISCELLANEOUS) ×3 IMPLANT
STENT URET 6FRX26 CONTOUR (STENTS) ×4 IMPLANT
SYR 10ML LL (SYRINGE) ×3 IMPLANT
TOWEL OR 17X26 4PK STRL BLUE (TOWEL DISPOSABLE) ×3 IMPLANT
WATER STERILE IRR 500ML POUR (IV SOLUTION) ×3 IMPLANT

## 2020-07-10 NOTE — Anesthesia Procedure Notes (Signed)
Procedure Name: LMA Insertion Date/Time: 07/10/2020 9:22 AM Performed by: Jonna Munro, CRNA Pre-anesthesia Checklist: Patient identified, Emergency Drugs available, Suction available, Patient being monitored and Timeout performed Patient Re-evaluated:Patient Re-evaluated prior to induction Oxygen Delivery Method: Circle system utilized Preoxygenation: Pre-oxygenation with 100% oxygen Induction Type: IV induction LMA: LMA inserted LMA Size: 5.0 Number of attempts: 1 Tube secured with: Tape Dental Injury: Teeth and Oropharynx as per pre-operative assessment

## 2020-07-10 NOTE — Op Note (Signed)
.  Preoperative diagnosis: bilateral ureteral stone  Postoperative diagnosis: Same  Procedure: 1 cystoscopy 2. Bilateral retrograde pyelography 3.  Intraoperative fluoroscopy, under one hour, with interpretation 4.  Right ureteroscopic stone manipulation with laser lithotripsy 5.  Left diagnostic ureteroscopy 5.  right 6 x 26 JJ stent placement  Attending: Rosie Fate  Anesthesia: General  Estimated blood loss: None  Drains: right 6 x 26 JJ ureteral stent with tether  Specimens: stone for analysis  Antibiotics: ancef  Findings: Right distal ureteral calculus. Left UVJ edema consistent with a recently passed calculus. Mild bilateral hydronephrosis. No masses/lesions in the bladder. Ureteral orifices in normal anatomic location.  Indications: Patient is a 86 year old male with a history of bilateral distal ureteral stone and elevated creatinine. After discussing treatment options, they decided proceed with bilateral ureteroscopic stone manipulation.  Procedure her in detail: The patient was brought to the operating room and a brief timeout was done to ensure correct patient, correct procedure, correct site.  General anesthesia was administered patient was placed in dorsal lithotomy position.  Her genitalia was then prepped and draped in usual sterile fashion.  A rigid 75 French cystoscope was passed in the urethra and the bladder.  Bladder was inspected free masses or lesions.  the ureteral orifices were in the normal orthotopic locations. a 6 french ureteral catheter was then instilled into the left ureteral orifice.   a gentle retrograde was obtained and findings noted above. We advanced a zipwire through the ureteral catheter and up to the renal pelvis. we then removed the cystoscope and cannulated the left ureteral orifice with a semirigid ureteroscope.  We located no stone in the ureter. We elected not do leave a stent since this was an uncomplicated ureteroscopy.  We then turned  out attention to the right side. a 6 french ureteral catheter was then instilled into the right ureteral orifice.   a gentle retrograde was obtained and findings noted above. We advanced a zipwire through the ureteral catheter and up to the renal pelvis.we then removed the cystoscope and cannulated the right ureteral orifice with a semirigid ureteroscope.  We located the stone in the distal ureter. Using a 200 nm laser fiber and fragmented the stone into smaller pieces.  the pieces were then removed with a engage basket.   once all stone fragments were removed we then placed a 6 x 26 double-j ureteral stent over the original zip wire.  We then removed the wire and good coil was noted in the the renal pelvis under fluoroscopy and the bladder under direct vision. the stone fragments were then removed from the bladder and sent for analysis.   the bladder was then drained and this concluded the procedure which was well tolerated by patient.  Complications: None  Condition: Stable, extubated, transferred to PACU  Plan: Patient is to be discharged home as to follow-up in 2 weeks. He is to remove his stent in 72 hours by pulling the tether

## 2020-07-10 NOTE — Discharge Instructions (Signed)
PATIENT INSTRUCTIONS POST-ANESTHESIA  IMMEDIATELY FOLLOWING SURGERY:  Do not drive or operate machinery for the first twenty four hours after surgery.  Do not make any important decisions for twenty four hours after surgery or while taking narcotic pain medications or sedatives.  If you develop intractable nausea and vomiting or a severe headache please notify your doctor immediately.  FOLLOW-UP:  Please make an appointment with your surgeon as instructed. You do not need to follow up with anesthesia unless specifically instructed to do so.  WOUND CARE INSTRUCTIONS (if applicable):  Keep a dry clean dressing on the anesthesia/puncture wound site if there is drainage.  Once the wound has quit draining you may leave it open to air.  Generally you should leave the bandage intact for twenty four hours unless there is drainage.  If the epidural site drains for more than 36-48 hours please call the anesthesia department.  QUESTIONS?:  Please feel free to call your physician or the hospital operator if you have any questions, and they will be happy to assist you.      Ureteral Stent Implantation, Care After This sheet gives you information about how to care for yourself after your procedure. Your health care provider may also give you more specific instructions. If you have problems or questions, contact your health care provider. What can I expect after the procedure? After the procedure, it is common to have:  Nausea.  Mild pain when you urinate. You may feel this pain in your lower back or lower abdomen. The pain should stop within a few minutes after you urinate. This may last for up to 1 week.  A small amount of blood in your urine for several days. Follow these instructions at home: Medicines  Take over-the-counter and prescription medicines only as told by your health care provider.  If you were prescribed an antibiotic medicine, take it as told by your health care provider. Do not stop  taking the antibiotic even if you start to feel better.  Do not drive for 24 hours if you were given a sedative during your procedure.  Ask your health care provider if the medicine prescribed to you requires you to avoid driving or using heavy machinery. Activity  Rest as told by your health care provider.  Avoid sitting for a long time without moving. Get up to take short walks every 1-2 hours. This is important to improve blood flow and breathing. Ask for help if you feel weak or unsteady.  Return to your normal activities as told by your health care provider. Ask your health care provider what activities are safe for you. General instructions   Watch for any blood in your urine. Call your health care provider if the amount of blood in your urine increases.  If you have a catheter: ? Follow instructions from your health care provider about taking care of your catheter and collection bag. ? Do not take baths, swim, or use a hot tub until your health care provider approves. Ask your health care provider if you may take showers. You may only be allowed to take sponge baths.  Drink enough fluid to keep your urine pale yellow.  Do not use any products that contain nicotine or tobacco, such as cigarettes, e-cigarettes, and chewing tobacco. These can delay healing after surgery. If you need help quitting, ask your health care provider.  Keep all follow-up visits as told by your health care provider. This is important. Contact a health care provider if:  You have pain that gets worse or does not get better with medicine, especially pain when you urinate.  You have difficulty urinating.  You feel nauseous or you vomit repeatedly during a period of more than 2 days after the procedure. Get help right away if:  Your urine is dark red or has blood clots in it.  You are leaking urine (have incontinence).  The end of the stent comes out of your urethra.  You cannot urinate.  You have  sudden, sharp, or severe pain in your abdomen or lower back.  You have a fever.  You have swelling or pain in your legs.  You have difficulty breathing. Summary  After the procedure, it is common to have mild pain when you urinate that goes away within a few minutes after you urinate. This may last for up to 1 week.  Watch for any blood in your urine. Call your health care provider if the amount of blood in your urine increases.  Take over-the-counter and prescription medicines only as told by your health care provider.  Drink enough fluid to keep your urine pale yellow. This information is not intended to replace advice given to you by your health care provider. Make sure you discuss any questions you have with your health care provider. Document Revised: 05/18/2018 Document Reviewed: 05/19/2018 Elsevier Patient Education  [place] IN 72 HOURS BY GENTLY PULLING THE STRING

## 2020-07-10 NOTE — Interval H&P Note (Signed)
History and Physical Interval Note:  07/10/2020 8:44 AM  [name]  has presented today for surgery, with the diagnosis of bilateral ureteral calculus.  The various methods of treatment have been discussed with the patient and family. After consideration of risks, benefits and other options for treatment, the patient has consented to  Procedure(s) with comments: [place], URETEROSCOPY AND STENT PLACEMENT (Bilateral) - spoke with pt's daughter, knows to arrive at 7:30 HOLMIUM LASER APPLICATION (Bilateral) as a surgical intervention.  The patient's history has been reviewed, patient examined, no change in status, stable for surgery.  I have reviewed the patient's chart and labs.  Questions were answered to the patient's satisfaction.     [name]

## 2020-07-10 NOTE — Anesthesia Preprocedure Evaluation (Addendum)
Anesthesia Evaluation  Patient identified by MRN, date of birth, ID band Patient awake    Reviewed: Allergy & Precautions, NPO status , Patient's Chart, lab work & pertinent test results, reviewed documented beta blocker date and time   History of Anesthesia Complications (+) PROLONGED EMERGENCENegative for: history of anesthetic complications  Airway Mallampati: II  TM Distance: >3 FB Neck ROM: Full    Dental  (+) Edentulous Upper, Edentulous Lower   Pulmonary asthma , sleep apnea , pneumonia, COPD,  COPD inhaler, former smoker,    Pulmonary exam normal breath sounds clear to auscultation       Cardiovascular hypertension, Pt. on home beta blockers and Pt. on medications + CAD, + CABG and + Peripheral Vascular Disease (AAA Repair)  + dysrhythmias (PVCs) Atrial Fibrillation + pacemaker  Rhythm:Regular Rate:Abnormal - Systolic murmurs, - Diastolic murmurs, - Friction Rub, - Carotid Bruit, - Peripheral Edema and - Systolic Click Wide complex  Rhythm  -First degree A-V block  PRi = 232 -Left bundle branch block and left axis.   - Extensive anterior-lateral infarct  (age undetermined).   ST-elevations may be secondary to conduction defect -consider acute process.   Echo-04/2020 1. Left ventricular ejection fraction, by estimation, is approximately  35%. The left ventricle has moderately decreased function. The left  ventricle demonstrates regional wall motion abnormalities (see scoring diagram/findings for description). Septal motion suggestive of RV pacing. Left ventricular diastolic parameters are indeterminate.  2. RV-RA gradient normal at 20 mmHg. Right ventricular systolic function is normal. The right ventricular size is normal. Device wire present.  3. Left atrial size was upper normal.  4. The mitral valve is grossly normal. Trivial mitral valve  regurgitation.  5. The aortic valve is tricuspid, moderately calcified.  Aortic valve  regurgitation is mild. Mild aortic valve stenosis. Aortic valve area, by VTI measures 1.45 cm. Aortic valve mean gradient measures 11.2 mmHg.  6. Aortic dilatation noted. There is mild dilatation of the aortic root.  7. Unable to estimate CVP.    Neuro/Psych CVA, Residual Symptoms    GI/Hepatic GERD  ,  Endo/Other  diabetes, Well Controlled, Type 2, Oral Hypoglycemic Agents  Renal/GU Renal disease   Prostate cancer    Musculoskeletal  (+) Arthritis ,   Abdominal   Peds  Hematology  (+) Blood dyscrasia (thrombocytopenia, last dose of eliquis 07/09/20), ,   Anesthesia Other Findings Assessment and Plan:  1. CAD in native artery Denies any anginal symptoms but reports mildly increased dyspnea on exertion.  Recent echocardiogram showed decreased ejection fraction at 35%.  Positive for R WMA's.  Trivial MR, mild aortic stenosis.     2. Sinus bradycardia Patient has a pacemaker in place for symptomatic bradycardia implanted by Dr. [name] for Mobitz 2 second-degree AV block..  Continue to follow with Dr. [name]  3. Essential hypertension Blood pressure today 114/58.  Continue Toprol-XL 12.5 mg p.o. daily.  Amlodipine and Imdur were stopped due to low blood pressures at PCP office recently.   4. PAF (paroxysmal atrial fibrillation) (HCC) Heart rate today regular at 56.  Continue Eliquis 5 mg p.o. twice daily.  Continue Toprol-XL 12.5 mg p.o. daily.   5.  Cardiomyopathy  Recent echocardiogram showed decreased ejection fraction at 35%.  Positive for R WMA's.  Trivial MR, mild aortic stenosis.  6.  Systolic heart failure.  (HFrEF) Continue Entresto 24/26 mg p.o. twice daily.    Start measuring blood pressures daily.  Take Lasix 20 mg daily.  Medication Adjustments/Labs and Tests Ordered: Current medicines are reviewed at length with the patient today.  Concerns regarding medicines are outlined above.   Disposition: Follow-up with Dr. Harl Bowie in  December Signed, Andrew Quinn, NP 06/19/2020 9:08 AM    [facility] at [address] [place] , [place], [place] [address] Phone: [phone]; Fax: [phone]  Reproductive/Obstetrics                           Anesthesia Physical Anesthesia Plan  ASA: IV  Anesthesia Plan: General   Post-op Pain Management:    Induction: Intravenous  PONV Risk Score and Plan: 4 or greater and Ondansetron  Airway Management Planned: Oral ETT  Additional Equipment:   Intra-op Plan:   Post-operative Plan: Extubation in OR  Informed Consent: I have reviewed the patients History and Physical, chart, labs and discussed the procedure including the risks, benefits and alternatives for the proposed anesthesia with the patient or authorized representative who has indicated his/her understanding and acceptance.       Plan Discussed with: CRNA and Surgeon  Anesthesia Plan Comments:        Anesthesia Quick Evaluation

## 2020-07-10 NOTE — Anesthesia Postprocedure Evaluation (Signed)
Anesthesia Post Note  Patient: [name]  Procedure(s) Performed: CYSTOSCOPY/RETROGRADE/URETEROSCOPY (Bilateral Ureter) HOLMIUM LASER APPLICATION (Right Ureter) CYSTOSCOPY WITH STENT PLACEMENT (Right Ureter)  Patient location during evaluation: PACU Anesthesia Type: General Level of consciousness: awake, oriented, awake and alert and patient cooperative Pain management: satisfactory to patient Vital Signs Assessment: post-procedure vital signs reviewed and stable Respiratory status: spontaneous breathing, respiratory function stable and nonlabored ventilation Cardiovascular status: stable Postop Assessment: no apparent nausea or vomiting Anesthetic complications: no   No complications documented.   Last Vitals:  Vitals:   07/10/20 0730 07/10/20 1025  BP: (!) 144/81 122/70  Pulse: 70   Resp: 18 16  Temp: 36.5 C (P) 36.9 C  SpO2: 100% (P) 94%    Last Pain:  Vitals:   07/10/20 1025  TempSrc:   PainSc: (P) 0-No pain                 Shakena Callari

## 2020-07-10 NOTE — Transfer of Care (Signed)
Immediate Anesthesia Transfer of Care Note  Patient: MIDAS DAUGHETY  Procedure(s) Performed: CYSTOSCOPY/RETROGRADE/URETEROSCOPY (Bilateral Ureter) HOLMIUM LASER APPLICATION (Right Ureter) CYSTOSCOPY WITH STENT PLACEMENT (Right Ureter)  Patient Location: PACU  Anesthesia Type:General  Level of Consciousness: awake, alert , oriented and patient cooperative  Airway & Oxygen Therapy: Patient Spontanous Breathing and Patient connected to face mask oxygen  Post-op Assessment: Report given to RN, Post -op Vital signs reviewed and stable and Patient moving all extremities X 4  Post vital signs: Reviewed and stable  Last Vitals:  Vitals Value Taken Time  BP 122/70 07/10/20 1026  Temp    Pulse 82 07/10/20 1028  Resp 19 07/10/20 1028  SpO2 91 % 07/10/20 1028  Vitals shown include unvalidated device data.  Last Pain:  Vitals:   07/10/20 0730  TempSrc: Oral  PainSc: 0-No pain         Complications: No complications documented.

## 2020-07-11 ENCOUNTER — Encounter (HOSPITAL_COMMUNITY): Payer: Self-pay | Admitting: Urology

## 2020-07-13 ENCOUNTER — Ambulatory Visit (INDEPENDENT_AMBULATORY_CARE_PROVIDER_SITE_OTHER): Payer: Medicare Other

## 2020-07-13 ENCOUNTER — Other Ambulatory Visit: Payer: Self-pay

## 2020-07-13 DIAGNOSIS — N200 Calculus of kidney: Secondary | ICD-10-CM | POA: Diagnosis not present

## 2020-07-13 NOTE — Progress Notes (Signed)
Pt came in today for tethered stent pull- stent string pulled without difficulty. Pt tolerated without difficulty.   Pt will return for post op appt in 2 weeks.

## 2020-07-18 ENCOUNTER — Other Ambulatory Visit: Payer: Medicare Other | Admitting: Urology

## 2020-07-24 ENCOUNTER — Other Ambulatory Visit: Payer: Medicare Other | Admitting: Urology

## 2020-07-25 ENCOUNTER — Other Ambulatory Visit: Payer: Self-pay

## 2020-07-25 ENCOUNTER — Ambulatory Visit (INDEPENDENT_AMBULATORY_CARE_PROVIDER_SITE_OTHER): Payer: Medicare Other | Admitting: Urology

## 2020-07-25 ENCOUNTER — Telehealth: Payer: Self-pay | Admitting: Family Medicine

## 2020-07-25 ENCOUNTER — Encounter: Payer: Self-pay | Admitting: Urology

## 2020-07-25 VITALS — BP 128/69 | HR 73 | Temp 97.9°F | Ht 70.0 in | Wt 210.1 lb

## 2020-07-25 DIAGNOSIS — N200 Calculus of kidney: Secondary | ICD-10-CM

## 2020-07-25 LAB — URINALYSIS, ROUTINE W REFLEX MICROSCOPIC
Bilirubin, UA: NEGATIVE
Glucose, UA: NEGATIVE
Leukocytes,UA: NEGATIVE
Nitrite, UA: NEGATIVE
Protein,UA: NEGATIVE
Specific Gravity, UA: 1.02 (ref 1.005–1.030)
Urobilinogen, Ur: 2 mg/dL — ABNORMAL HIGH (ref 0.2–1.0)
pH, UA: 5.5 (ref 5.0–7.5)

## 2020-07-25 LAB — MICROSCOPIC EXAMINATION
Bacteria, UA: NONE SEEN
Epithelial Cells (non renal): NONE SEEN /hpf (ref 0–10)
Renal Epithel, UA: NONE SEEN /hpf

## 2020-07-25 MED ORDER — SACUBITRIL-VALSARTAN 24-26 MG PO TABS
1.0000 | ORAL_TABLET | Freq: Two times a day (BID) | ORAL | 0 refills | Status: DC
Start: 2020-07-25 — End: 2020-08-15

## 2020-07-25 MED ORDER — APIXABAN 5 MG PO TABS
5.0000 mg | ORAL_TABLET | Freq: Two times a day (BID) | ORAL | 0 refills | Status: DC
Start: 2020-07-25 — End: 2020-08-15

## 2020-07-25 NOTE — Progress Notes (Signed)
Urological Symptom Review  Patient is experiencing the following symptoms: Get up at night to urinate Stream starts and stops Blood in urine Erection problems (male only)   Review of Systems  Gastrointestinal (upper)  : Negative for upper GI symptoms  Gastrointestinal (lower) : Negative for lower GI symptoms  Constitutional : Negative for symptoms  Skin: Skin rash/lesion  Eyes: Negative for eye symptoms  Ear/Nose/Throat : Negative for Ear/Nose/Throat symptoms  Hematologic/Lymphatic: Easy bruising  Cardiovascular : Negative for cardiovascular symptoms  Respiratory : Negative for respiratory symptoms  Endocrine: Negative for endocrine symptoms  Musculoskeletal: Joint pain  Neurological: Negative for neurological symptoms  Psychologic: Depression

## 2020-07-25 NOTE — Patient Instructions (Signed)
Dietary Guidelines to Help Prevent Kidney Stones Kidney stones are deposits of minerals and salts that form inside your kidneys. Your risk of developing kidney stones may be greater depending on your diet, your lifestyle, the medicines you take, and whether you have certain medical conditions. Most people can reduce their chances of developing kidney stones by following the instructions below. Depending on your overall health and the type of kidney stones you tend to develop, your dietitian may give you more specific instructions. What are tips for following this plan? Reading food labels  Choose foods with "no salt added" or "low-salt" labels. Limit your sodium intake to less than 1500 mg per day.  Choose foods with calcium for each meal and snack. Try to eat about 300 mg of calcium at each meal. Foods that contain 200-500 mg of calcium per serving include: ? 8 oz (237 ml) of milk, fortified nondairy milk, and fortified fruit juice. ? 8 oz (237 ml) of kefir, yogurt, and soy yogurt. ? 4 oz (118 ml) of tofu. ? 1 oz of cheese. ? 1 cup (300 g) of dried figs. ? 1 cup (91 g) of cooked broccoli. ? 1-3 oz can of sardines or mackerel.  Most people need 1000 to 1500 mg of calcium each day. Talk to your dietitian about how much calcium is recommended for you. Shopping  Buy plenty of fresh fruits and vegetables. Most people do not need to avoid fruits and vegetables, even if they contain nutrients that may contribute to kidney stones.  When shopping for convenience foods, choose: ? Whole pieces of fruit. ? Premade salads with dressing on the side. ? Low-fat fruit and yogurt smoothies.  Avoid buying frozen meals or prepared deli foods.  Look for foods with live cultures, such as yogurt and kefir. Cooking  Do not add salt to food when cooking. Place a salt shaker on the table and allow each person to add his or her own salt to taste.  Use vegetable protein, such as beans, textured vegetable  protein (TVP), or tofu instead of meat in pasta, casseroles, and soups. Meal planning   Eat less salt, if told by your dietitian. To do this: ? Avoid eating processed or premade food. ? Avoid eating fast food.  Eat less animal protein, including cheese, meat, poultry, or fish, if told by your dietitian. To do this: ? Limit the number of times you have meat, poultry, fish, or cheese each week. Eat a diet free of meat at least 2 days a week. ? Eat only one serving each day of meat, poultry, fish, or seafood. ? When you prepare animal protein, cut pieces into small portion sizes. For most meat and fish, one serving is about the size of one deck of cards.  Eat at least 5 servings of fresh fruits and vegetables each day. To do this: ? Keep fruits and vegetables on hand for snacks. ? Eat 1 piece of fruit or a handful of berries with breakfast. ? Have a salad and fruit at lunch. ? Have two kinds of vegetables at dinner.  Limit foods that are high in a substance called oxalate. These include: ? Spinach. ? Rhubarb. ? Beets. ? Potato chips and french fries. ? Nuts.  If you regularly take a diuretic medicine, make sure to eat at least 1-2 fruits or vegetables high in potassium each day. These include: ? Avocado. ? Banana. ? Orange, prune, carrot, or tomato juice. ? Baked potato. ? Cabbage. ? Beans and split   peas. General instructions   Drink enough fluid to keep your urine clear or pale yellow. This is the most important thing you can do.  Talk to your health care provider and dietitian about taking daily supplements. Depending on your health and the cause of your kidney stones, you may be advised: ? Not to take supplements with vitamin C. ? To take a calcium supplement. ? To take a daily probiotic supplement. ? To take other supplements such as magnesium, fish oil, or vitamin B6.  Take all medicines and supplements as told by your health care provider.  Limit alcohol intake to no  more than 1 drink a day for nonpregnant women and 2 drinks a day for men. One drink equals 12 oz of beer, 5 oz of wine, or 1 oz of hard liquor.  Lose weight if told by your health care provider. Work with your dietitian to find strategies and an eating plan that works best for you. What foods are not recommended? Limit your intake of the following foods, or as told by your dietitian. Talk to your dietitian about specific foods you should avoid based on the type of kidney stones and your overall health. Grains Breads. Bagels. Rolls. Baked goods. Salted crackers. Cereal. Pasta. Vegetables Spinach. Rhubarb. Beets. Canned vegetables. Pickles. Olives. Meats and other protein foods Nuts. Nut butters. Large portions of meat, poultry, or fish. Salted or cured meats. Deli meats. Hot dogs. Sausages. Dairy Cheese. Beverages Regular soft drinks. Regular vegetable juice. Seasonings and other foods Seasoning blends with salt. Salad dressings. Canned soups. Soy sauce. Ketchup. Barbecue sauce. Canned pasta sauce. Casseroles. Pizza. Lasagna. Frozen meals. Potato chips. French fries. Summary  You can reduce your risk of kidney stones by making changes to your diet.  The most important thing you can do is drink enough fluid. You should drink enough fluid to keep your urine clear or pale yellow.  Ask your health care provider or dietitian how much protein from animal sources you should eat each day, and also how much salt and calcium you should have each day. This information is not intended to replace advice given to you by your health care provider. Make sure you discuss any questions you have with your health care provider. Document Revised: 12/01/2018 Document Reviewed: 07/22/2016 Elsevier Patient Education  2020 [employer]  
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
## 2020-07-25 NOTE — Telephone Encounter (Signed)
Samples available for pick up-mailbox full and unable to leave message when attempted to inform.

## 2020-07-25 NOTE — Telephone Encounter (Signed)
Patient calling the office for samples of medication:   1.  What medication and dosage are you requesting samples for?  ENTRESTO  ELIQUIS 5 MG   2.  Are you currently out of this medication?   Please call [name]  [phone]

## 2020-07-26 ENCOUNTER — Telehealth: Payer: Medicare Other

## 2020-08-10 ENCOUNTER — Ambulatory Visit (INDEPENDENT_AMBULATORY_CARE_PROVIDER_SITE_OTHER): Payer: Medicare Other | Admitting: Family Medicine

## 2020-08-10 ENCOUNTER — Other Ambulatory Visit: Payer: Self-pay

## 2020-08-10 VITALS — BP 113/53 | HR 77 | Temp 97.9°F | Ht 70.0 in | Wt 204.0 lb

## 2020-08-10 DIAGNOSIS — E785 Hyperlipidemia, unspecified: Secondary | ICD-10-CM

## 2020-08-10 DIAGNOSIS — E1159 Type 2 diabetes mellitus with other circulatory complications: Secondary | ICD-10-CM | POA: Diagnosis not present

## 2020-08-10 DIAGNOSIS — I25810 Atherosclerosis of coronary artery bypass graft(s) without angina pectoris: Secondary | ICD-10-CM | POA: Diagnosis not present

## 2020-08-10 DIAGNOSIS — I480 Paroxysmal atrial fibrillation: Secondary | ICD-10-CM

## 2020-08-10 DIAGNOSIS — E1169 Type 2 diabetes mellitus with other specified complication: Secondary | ICD-10-CM | POA: Diagnosis not present

## 2020-08-10 DIAGNOSIS — E1142 Type 2 diabetes mellitus with diabetic polyneuropathy: Secondary | ICD-10-CM

## 2020-08-10 DIAGNOSIS — I152 Hypertension secondary to endocrine disorders: Secondary | ICD-10-CM

## 2020-08-10 LAB — CMP14+EGFR
ALT: 13 IU/L (ref 0–44)
AST: 17 IU/L (ref 0–40)
Albumin/Globulin Ratio: 2.3 — ABNORMAL HIGH (ref 1.2–2.2)
Albumin: 4.1 g/dL (ref 3.6–4.6)
Alkaline Phosphatase: 104 IU/L (ref 44–121)
BUN/Creatinine Ratio: 13 (ref 10–24)
BUN: 14 mg/dL (ref 8–27)
Bilirubin Total: 1.1 mg/dL (ref 0.0–1.2)
CO2: 24 mmol/L (ref 20–29)
Calcium: 9.4 mg/dL (ref 8.6–10.2)
Chloride: 103 mmol/L (ref 96–106)
Creatinine, Ser: 1.07 mg/dL (ref 0.76–1.27)
GFR calc Af Amer: 73 mL/min/{1.73_m2} (ref >59)
GFR calc non Af Amer: 63 mL/min/{1.73_m2} (ref >59)
Globulin, Total: 1.8 g/dL (ref 1.5–4.5)
Glucose: 120 mg/dL — ABNORMAL HIGH (ref 65–99)
Potassium: 4.9 mmol/L (ref 3.5–5.2)
Sodium: 142 mmol/L (ref 134–144)
Total Protein: 5.9 g/dL — ABNORMAL LOW (ref 6.0–8.5)

## 2020-08-10 LAB — BAYER DCA HB A1C WAIVED: HB A1C (BAYER DCA - WAIVED): 6.1 % (ref <7.0)

## 2020-08-10 LAB — HEMOGLOBIN, FINGERSTICK: Hemoglobin: 12.3 g/dL — ABNORMAL LOW (ref 12.6–17.7)
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
## 2020-08-10 NOTE — Patient Instructions (Signed)
NO AMLODIPINE.  Blood pressure is too low to use that medication in addition to what your heart doctor is already prescribing.  Bring your cuff into our next appointment.  There is a 30 point discrepancy between our machines.  Sugar was perfect. A1c 6.1.  Keep up the good work.

## 2020-08-15 ENCOUNTER — Encounter: Payer: Self-pay | Admitting: Cardiology

## 2020-08-15 ENCOUNTER — Ambulatory Visit: Payer: Medicare Other | Admitting: Cardiology

## 2020-08-15 VITALS — BP 144/70 | HR 62 | Ht 71.0 in | Wt 205.0 lb

## 2020-08-15 DIAGNOSIS — R600 Localized edema: Secondary | ICD-10-CM | POA: Diagnosis not present

## 2020-08-15 DIAGNOSIS — R0600 Dyspnea, unspecified: Secondary | ICD-10-CM | POA: Diagnosis not present

## 2020-08-15 DIAGNOSIS — I5022 Chronic systolic (congestive) heart failure: Secondary | ICD-10-CM

## 2020-08-15 DIAGNOSIS — I2510 Atherosclerotic heart disease of native coronary artery without angina pectoris: Secondary | ICD-10-CM | POA: Diagnosis not present

## 2020-08-15 DIAGNOSIS — R0609 Other forms of dyspnea: Secondary | ICD-10-CM

## 2020-08-15 MED ORDER — APIXABAN 5 MG PO TABS
5.0000 mg | ORAL_TABLET | Freq: Two times a day (BID) | ORAL | 0 refills | Status: DC
Start: 2020-08-15 — End: 2020-09-27

## 2020-08-15 MED ORDER — SPIRONOLACTONE 25 MG PO TABS: 12.5000 mg | ORAL_TABLET | Freq: Every day | ORAL | 1 refills | Status: DC

## 2020-08-15 MED ORDER — FUROSEMIDE 20 MG PO TABS: 20.0000 mg | ORAL_TABLET | ORAL | 1 refills | Status: DC

## 2020-08-15 MED ORDER — SACUBITRIL-VALSARTAN 24-26 MG PO TABS
1.0000 | ORAL_TABLET | Freq: Two times a day (BID) | ORAL | 0 refills | Status: DC
Start: 2020-08-15 — End: 2020-10-18
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
## 2020-08-15 NOTE — Patient Instructions (Signed)
Your physician recommends that you schedule a follow-up appointment in: [place] has recommended you make the following change in your medication:   START ALDACTONE 12.5 MG (1/2 TABLET) DAILY   Your physician recommends that you return for lab work in: 2 WEEKS BMP  Thank you for choosing [facility]!!

## 2020-08-16 ENCOUNTER — Telehealth: Payer: Self-pay | Admitting: Cardiology

## 2020-08-16 NOTE — Telephone Encounter (Signed)
Pt daughter wanted to clarify medications ordered yesterday (Aldactone 12.5 mg daily  [facility])

## 2020-08-16 NOTE — Telephone Encounter (Signed)
Please give pt's daughter a call concerning pt's medication -   [name] [phone]

## 2020-08-21 ENCOUNTER — Ambulatory Visit (INDEPENDENT_AMBULATORY_CARE_PROVIDER_SITE_OTHER): Payer: Medicare Other

## 2020-08-21 DIAGNOSIS — I441 Atrioventricular block, second degree: Secondary | ICD-10-CM

## 2020-08-21 LAB — CUP PACEART REMOTE DEVICE CHECK
Battery Remaining Longevity: 128 mo
Battery Remaining Percentage: 95.5 %
Battery Voltage: 3.01 V
Brady Statistic AP VP Percent: 29 %
Brady Statistic AP VS Percent: 1 %
Brady Statistic AS VP Percent: 68 %
Brady Statistic AS VS Percent: 1 %
Brady Statistic RA Percent Paced: 27 %
Brady Statistic RV Percent Paced: 97 %
Date Time Interrogation Session: 20211228020017
Implantable Lead Implant Date: 20200630
Implantable Lead Implant Date: 20200630
Implantable Lead Location: 753859
Implantable Lead Location: 753860
Implantable Pulse Generator Implant Date: 20200630
Lead Channel Impedance Value: 530 Ohm
Lead Channel Impedance Value: 700 Ohm
Lead Channel Pacing Threshold Amplitude: 0.625 V
Lead Channel Pacing Threshold Amplitude: 1 V
Lead Channel Pacing Threshold Pulse Width: 0.5 ms
Lead Channel Pacing Threshold Pulse Width: 0.5 ms
Lead Channel Sensing Intrinsic Amplitude: 0.8 mV
Lead Channel Sensing Intrinsic Amplitude: 12 mV
Lead Channel Setting Pacing Amplitude: 0.875
Lead Channel Setting Pacing Amplitude: 2 V
Lead Channel Setting Pacing Pulse Width: 0.5 ms
Lead Channel Setting Sensing Sensitivity: 4 mV
Pulse Gen Model: 2272
Pulse Gen Serial Number: 3311958

## 2020-08-23 ENCOUNTER — Ambulatory Visit: Payer: Medicare Other | Admitting: Pharmacist

## 2020-08-27 ENCOUNTER — Ambulatory Visit (HOSPITAL_COMMUNITY): Payer: Medicare Other

## 2020-08-29 ENCOUNTER — Telehealth: Payer: Medicare Other

## 2020-08-30 ENCOUNTER — Other Ambulatory Visit: Payer: Self-pay

## 2020-08-30 ENCOUNTER — Ambulatory Visit (HOSPITAL_COMMUNITY)
Admission: RE | Admit: 2020-08-30 | Discharge: 2020-08-30 | Disposition: A | Discharge status: Home / Self Care | Payer: Medicare Other | Source: Ambulatory Visit | Attending: Urology | Admitting: Urology

## 2020-08-30 ENCOUNTER — Encounter: Payer: Self-pay | Admitting: Urology

## 2020-08-30 ENCOUNTER — Ambulatory Visit (INDEPENDENT_AMBULATORY_CARE_PROVIDER_SITE_OTHER): Payer: Medicare Other | Admitting: Urology

## 2020-08-30 VITALS — BP 122/65 | HR 73 | Temp 97.7°F

## 2020-08-30 DIAGNOSIS — N200 Calculus of kidney: Secondary | ICD-10-CM | POA: Diagnosis not present

## 2020-08-30 DIAGNOSIS — N281 Cyst of kidney, acquired: Secondary | ICD-10-CM | POA: Diagnosis not present

## 2020-08-30 LAB — URINALYSIS, ROUTINE W REFLEX MICROSCOPIC
Bilirubin, UA: NEGATIVE
Glucose, UA: NEGATIVE
Ketones, UA: NEGATIVE
Leukocytes,UA: NEGATIVE
Nitrite, UA: NEGATIVE
Protein,UA: NEGATIVE
Specific Gravity, UA: 1.01 (ref 1.005–1.030)
Urobilinogen, Ur: 1 mg/dL (ref 0.2–1.0)
pH, UA: 6 (ref 5.0–7.5)

## 2020-08-30 LAB — MICROSCOPIC EXAMINATION
Epithelial Cells (non renal): NONE SEEN /hpf (ref 0–10)
WBC, UA: NONE SEEN /hpf (ref 0–5)
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
## 2020-08-30 NOTE — Progress Notes (Unsigned)
Urological Symptom Review  Patient is experiencing the following symptoms: Frequent urination Hard to postpone urination Get up at night to urinate Leakage of urine   Review of Systems  Gastrointestinal (upper)  : Negative for upper GI symptoms  Gastrointestinal (lower) : Negative for lower GI symptoms  Constitutional : Fatigue  Skin: Negative for skin symptoms  Eyes: Negative for eye symptoms  Ear/Nose/Throat : Negative for Ear/Nose/Throat symptoms  Hematologic/Lymphatic: Easy bruising  Cardiovascular : Chest pain  Respiratory : Cough Shortness of breath  Endocrine: Negative for endocrine symptoms  Musculoskeletal: Negative for musculoskeletal symptoms  Neurological: Negative for neurological symptoms  Psychologic: Negative for psychiatric symptoms

## 2020-08-30 NOTE — Patient Instructions (Signed)
Dietary Guidelines to Help Prevent Kidney Stones Kidney stones are deposits of minerals and salts that form inside your kidneys. Your risk of developing kidney stones may be greater depending on your diet, your lifestyle, the medicines you take, and whether you have certain medical conditions. Most people can reduce their chances of developing kidney stones by following the instructions below. Depending on your overall health and the type of kidney stones you tend to develop, your dietitian may give you more specific instructions. What are tips for following this plan? Reading food labels  Choose foods with "no salt added" or "low-salt" labels. Limit your sodium intake to less than 1500 mg per day.  Choose foods with calcium for each meal and snack. Try to eat about 300 mg of calcium at each meal. Foods that contain 200-500 mg of calcium per serving include: ? 8 oz (237 ml) of milk, fortified nondairy milk, and fortified fruit juice. ? 8 oz (237 ml) of kefir, yogurt, and soy yogurt. ? 4 oz (118 ml) of tofu. ? 1 oz of cheese. ? 1 cup (300 g) of dried figs. ? 1 cup (91 g) of cooked broccoli. ? 1-3 oz can of sardines or mackerel.  Most people need 1000 to 1500 mg of calcium each day. Talk to your dietitian about how much calcium is recommended for you. Shopping  Buy plenty of fresh fruits and vegetables. Most people do not need to avoid fruits and vegetables, even if they contain nutrients that may contribute to kidney stones.  When shopping for convenience foods, choose: ? Whole pieces of fruit. ? Premade salads with dressing on the side. ? Low-fat fruit and yogurt smoothies.  Avoid buying frozen meals or prepared deli foods.  Look for foods with live cultures, such as yogurt and kefir. Cooking  Do not add salt to food when cooking. Place a salt shaker on the table and allow each person to add his or her own salt to taste.  Use vegetable protein, such as beans, textured vegetable  protein (TVP), or tofu instead of meat in pasta, casseroles, and soups. Meal planning   Eat less salt, if told by your dietitian. To do this: ? Avoid eating processed or premade food. ? Avoid eating fast food.  Eat less animal protein, including cheese, meat, poultry, or fish, if told by your dietitian. To do this: ? Limit the number of times you have meat, poultry, fish, or cheese each week. Eat a diet free of meat at least 2 days a week. ? Eat only one serving each day of meat, poultry, fish, or seafood. ? When you prepare animal protein, cut pieces into small portion sizes. For most meat and fish, one serving is about the size of one deck of cards.  Eat at least 5 servings of fresh fruits and vegetables each day. To do this: ? Keep fruits and vegetables on hand for snacks. ? Eat 1 piece of fruit or a handful of berries with breakfast. ? Have a salad and fruit at lunch. ? Have two kinds of vegetables at dinner.  Limit foods that are high in a substance called oxalate. These include: ? Spinach. ? Rhubarb. ? Beets. ? Potato chips and french fries. ? Nuts.  If you regularly take a diuretic medicine, make sure to eat at least 1-2 fruits or vegetables high in potassium each day. These include: ? Avocado. ? Banana. ? Orange, prune, carrot, or tomato juice. ? Baked potato. ? Cabbage. ? Beans and split   peas. General instructions   Drink enough fluid to keep your urine clear or pale yellow. This is the most important thing you can do.  Talk to your health care provider and dietitian about taking daily supplements. Depending on your health and the cause of your kidney stones, you may be advised: ? Not to take supplements with vitamin C. ? To take a calcium supplement. ? To take a daily probiotic supplement. ? To take other supplements such as magnesium, fish oil, or vitamin B6.  Take all medicines and supplements as told by your health care provider.  Limit alcohol intake to no  more than 1 drink a day for nonpregnant women and 2 drinks a day for men. One drink equals 12 oz of beer, 5 oz of wine, or 1 oz of hard liquor.  Lose weight if told by your health care provider. Work with your dietitian to find strategies and an eating plan that works best for you. What foods are not recommended? Limit your intake of the following foods, or as told by your dietitian. Talk to your dietitian about specific foods you should avoid based on the type of kidney stones and your overall health. Grains Breads. Bagels. Rolls. Baked goods. Salted crackers. Cereal. Pasta. Vegetables Spinach. Rhubarb. Beets. Canned vegetables. Pickles. Olives. Meats and other protein foods Nuts. Nut butters. Large portions of meat, poultry, or fish. Salted or cured meats. Deli meats. Hot dogs. Sausages. Dairy Cheese. Beverages Regular soft drinks. Regular vegetable juice. Seasonings and other foods Seasoning blends with salt. Salad dressings. Canned soups. Soy sauce. Ketchup. Barbecue sauce. Canned pasta sauce. Casseroles. Pizza. Lasagna. Frozen meals. Potato chips. French fries. Summary  You can reduce your risk of kidney stones by making changes to your diet.  The most important thing you can do is drink enough fluid. You should drink enough fluid to keep your urine clear or pale yellow.  Ask your health care provider or dietitian how much protein from animal sources you should eat each day, and also how much salt and calcium you should have each day. This information is not intended to replace advice given to you by your health care provider. Make sure you discuss any questions you have with your health care provider. Document Revised: 12/01/2018 Document Reviewed: 07/22/2016 Elsevier Patient Education  2020 [employer]  

## 2020-09-03 NOTE — Progress Notes (Signed)
Remote pacemaker transmission.   

## 2020-09-05 ENCOUNTER — Other Ambulatory Visit: Payer: Medicare Other

## 2020-09-05 ENCOUNTER — Other Ambulatory Visit: Payer: Self-pay | Admitting: *Deleted

## 2020-09-05 ENCOUNTER — Other Ambulatory Visit: Payer: Self-pay

## 2020-09-05 DIAGNOSIS — I2510 Atherosclerotic heart disease of native coronary artery without angina pectoris: Secondary | ICD-10-CM

## 2020-09-06 LAB — BASIC METABOLIC PANEL
BUN/Creatinine Ratio: 16 (ref 10–24)
BUN: 18 mg/dL (ref 8–27)
CO2: 27 mmol/L (ref 20–29)
Calcium: 9 mg/dL (ref 8.6–10.2)
Chloride: 107 mmol/L — ABNORMAL HIGH (ref 96–106)
Creatinine, Ser: 1.12 mg/dL (ref 0.76–1.27)
GFR calc Af Amer: 69 mL/min/{1.73_m2} (ref >59)
GFR calc non Af Amer: 60 mL/min/{1.73_m2} (ref >59)
Glucose: 123 mg/dL — ABNORMAL HIGH (ref 65–99)
Potassium: 4.3 mmol/L (ref 3.5–5.2)
Sodium: 145 mmol/L — ABNORMAL HIGH (ref 134–144)

## 2020-09-10 ENCOUNTER — Telehealth: Payer: Self-pay | Admitting: *Deleted

## 2020-09-10 NOTE — Telephone Encounter (Signed)
-----   Message from Arnoldo Lenis, MD sent at 09/10/2020 12:26 PM EST ----- Labs look fine  Zandra Abts MD

## 2020-09-10 NOTE — Telephone Encounter (Signed)
Pt daughter aware

## 2020-09-12 ENCOUNTER — Telehealth: Payer: Self-pay

## 2020-09-12 NOTE — Telephone Encounter (Signed)
Got phone message from pts drt saying pt had hip and back pain and was throwing up. Called back. No answer. Left message to take pt to ER.

## 2020-09-13 ENCOUNTER — Ambulatory Visit (INDEPENDENT_AMBULATORY_CARE_PROVIDER_SITE_OTHER): Payer: Medicare Other | Admitting: Urology

## 2020-09-13 ENCOUNTER — Encounter: Payer: Self-pay | Admitting: Urology

## 2020-09-13 ENCOUNTER — Other Ambulatory Visit: Payer: Self-pay

## 2020-09-13 ENCOUNTER — Ambulatory Visit (HOSPITAL_COMMUNITY)
Admission: RE | Admit: 2020-09-13 | Discharge: 2020-09-13 | Disposition: A | Discharge status: Home / Self Care | Payer: Medicare Other | Source: Ambulatory Visit | Attending: Urology | Admitting: Urology

## 2020-09-13 VITALS — BP 131/50 | HR 62 | Temp 97.6°F

## 2020-09-13 DIAGNOSIS — N200 Calculus of kidney: Secondary | ICD-10-CM | POA: Insufficient documentation

## 2020-09-13 DIAGNOSIS — M47816 Spondylosis without myelopathy or radiculopathy, lumbar region: Secondary | ICD-10-CM | POA: Diagnosis not present

## 2020-09-13 DIAGNOSIS — N2889 Other specified disorders of kidney and ureter: Secondary | ICD-10-CM | POA: Diagnosis not present

## 2020-09-13 DIAGNOSIS — M160 Bilateral primary osteoarthritis of hip: Secondary | ICD-10-CM | POA: Diagnosis not present

## 2020-09-13 DIAGNOSIS — Z87442 Personal history of urinary calculi: Secondary | ICD-10-CM | POA: Diagnosis not present

## 2020-09-13 LAB — MICROSCOPIC EXAMINATION
Bacteria, UA: NONE SEEN
Epithelial Cells (non renal): NONE SEEN /hpf (ref 0–10)
Renal Epithel, UA: NONE SEEN /hpf
WBC, UA: NONE SEEN /hpf (ref 0–5)

## 2020-09-13 LAB — URINALYSIS, ROUTINE W REFLEX MICROSCOPIC
Bilirubin, UA: NEGATIVE
Glucose, UA: NEGATIVE
Leukocytes,UA: NEGATIVE
Nitrite, UA: NEGATIVE
Specific Gravity, UA: >1.03 — ABNORMAL HIGH (ref 1.005–1.030)
Urobilinogen, Ur: 2 mg/dL — ABNORMAL HIGH (ref 0.2–1.0)
pH, UA: 5 (ref 5.0–7.5)

## 2020-09-13 MED ORDER — OXYCODONE-ACETAMINOPHEN 5-325 MG PO TABS: 0.5000 | ORAL_TABLET | ORAL | 0 refills | Status: DC | PRN
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
## 2020-09-13 NOTE — Patient Instructions (Signed)

## 2020-09-13 NOTE — Addendum Note (Signed)
Addended by: Valentina Lucks on: 09/13/2020 01:28 PM   Modules accepted: Orders

## 2020-09-13 NOTE — Progress Notes (Signed)
Urological Symptom Review  Patient is experiencing the following symptoms: Get up at night to urinate   Review of Systems  Gastrointestinal (upper)  : Vomiting  Gastrointestinal (lower) : Negative for lower GI symptoms  Constitutional : Fatigue  Skin: Negative for skin symptoms  Eyes: Negative for eye symptoms  Ear/Nose/Throat : Negative for Ear/Nose/Throat symptoms  Hematologic/Lymphatic: Negative for Hematologic/Lymphatic symptoms  Cardiovascular : Negative for cardiovascular symptoms  Respiratory : Negative for respiratory symptoms  Endocrine: Negative for endocrine symptoms  Musculoskeletal: Negative for musculoskeletal symptoms  Neurological: Dizziness  Psychologic: Negative for psychiatric symptoms

## 2020-09-17 ENCOUNTER — Telehealth: Payer: Self-pay | Admitting: Urology

## 2020-09-17 NOTE — Telephone Encounter (Signed)
See other task

## 2020-09-17 NOTE — Telephone Encounter (Signed)
Patients daughter called back and LM stating her dad cannot wait that long for surgery. She asked for a nurse to call her.

## 2020-09-17 NOTE — Telephone Encounter (Signed)
I spoke with daughter and surgery date of 09/25/2020 discussed with daughter.  Daughter requested a sooner surgery date, I did inform the daughter Dr. Alyson Ingles OR schedule is full at the current time and 2/1 is first available.  Daughter is aware if patient stops producing urine he will need to go to ER. Voiced understanding.

## 2020-09-17 NOTE — Telephone Encounter (Signed)
Patients caretaker called inquiring about scheduling for a procedure. She states Dr Alyson Ingles was going to get his nurse to call when it was scheduled.

## 2020-09-20 NOTE — Patient Instructions (Signed)
[name]  09/20/2020     @PREFPERIOPPHARMACY @   Your procedure is scheduled on  09/25/2020.   Report to [facility] at  1145  A.M.   Call this number if you have problems the morning of surgery:  [phone]   Remember:  Do not eat or drink after midnight.                         Take these medicines the morning of surgery with A SIP OF WATER             Amiodarone, antivert, metoprolol, prilosec, oxycodone(if needed), entresto, flomax.           Use your inhalers before you come and bring your rescue inhaler with you.         Follow any instructions given to you by Dr Alyson Ingles concerning your eliquis.    Please brush your teeth.  Do not wear jewelry, make-up or nail polish.  Do not wear lotions, powders, or perfumes, or deodorant.  Do not shave 48 hours prior to surgery.  Men may shave face and neck.  Do not bring valuables to the hospital.  [facility] is not responsible for any belongings or valuables.  Contacts, dentures or bridgework may not be worn into surgery.  Leave your suitcase in the car.  After surgery it may be brought to your room.  For patients admitted to the hospital, discharge time will be determined by your treatment team.  Patients discharged the day of surgery will not be allowed to drive home and will need someone with them for 24 hours.   Special instructions:  DO NOT smoke (tobacco or vape) the morning of your procedure.  Please read over the following fact sheets that you were given. Anesthesia Post-op Instructions and Care and Recovery After Surgery       Ureteral Stent Implantation, Care After This sheet gives you information about how to care for yourself after your procedure. Your health care provider may also give you more specific instructions. If you have problems or questions, contact your health care provider. What can I expect after the procedure? After the procedure, it is common to have:  Nausea.  Mild pain  when you urinate. You may feel this pain in your lower back or lower abdomen. The pain should stop within a few minutes after you urinate. This may last for up to 1 week.  A small amount of blood in your urine for several days. Follow these instructions at home: Medicines  Take over-the-counter and prescription medicines only as told by your health care provider.  If you were prescribed an antibiotic medicine, take it as told by your health care provider. Do not stop taking the antibiotic even if you start to feel better.  Do not drive for 24 hours if you were given a sedative during your procedure.  Ask your health care provider if the medicine prescribed to you requires you to avoid driving or using heavy machinery. Activity  Rest as told by your health care provider.  Avoid sitting for a long time without moving. Get up to take short walks every 1-2 hours. This is important to improve blood flow and breathing. Ask for help if you feel weak or unsteady.  Return to your normal activities as told by your health care provider. Ask your health care provider what activities are safe for you. General instructions  Watch for any blood in your urine. Call your health care provider if the amount of blood in your urine increases.  If you have a catheter: ? Follow instructions from your health care provider about taking care of your catheter and collection bag. ? Do not take baths, swim, or use a hot tub until your health care provider approves. Ask your health care provider if you may take showers. You may only be allowed to take sponge baths.  Drink enough fluid to keep your urine pale yellow.  Do not use any products that contain nicotine or tobacco, such as cigarettes, e-cigarettes, and chewing tobacco. These can delay healing after surgery. If you need help quitting, ask your health care provider.  Keep all follow-up visits as told by your health care provider. This is important.    Contact a health care provider if:  You have pain that gets worse or does not get better with medicine, especially pain when you urinate.  You have difficulty urinating.  You feel nauseous or you vomit repeatedly during a period of more than 2 days after the procedure. Get help right away if:  Your urine is dark red or has blood clots in it.  You are leaking urine (have incontinence).  The end of the stent comes out of your urethra.  You cannot urinate.  You have sudden, sharp, or severe pain in your abdomen or lower back.  You have a fever.  You have swelling or pain in your legs.  You have difficulty breathing. Summary  After the procedure, it is common to have mild pain when you urinate that goes away within a few minutes after you urinate. This may last for up to 1 week.  Watch for any blood in your urine. Call your health care provider if the amount of blood in your urine increases.  Take over-the-counter and prescription medicines only as told by your health care provider.  Drink enough fluid to keep your urine pale yellow. This information is not intended to replace advice given to you by your health care provider. Make sure you discuss any questions you have with your health care provider. Document Revised: 05/18/2018 Document Reviewed: 05/19/2018 Elsevier Patient Education  2021 [place] Anesthesia, Adult, Care After This sheet gives you information about how to care for yourself after your procedure. Your health care provider may also give you more specific instructions. If you have problems or questions, contact your health care provider. What can I expect after the procedure? After the procedure, the following side effects are common:  Pain or discomfort at the IV site.  Nausea.  Vomiting.  Sore throat.  Trouble concentrating.  Feeling cold or chills.  Feeling weak or tired.  Sleepiness and fatigue.  Soreness and body aches. These  side effects can affect parts of the body that were not involved in surgery. Follow these instructions at home: For the time period you were told by your health care provider:  Rest.  Do not participate in activities where you could fall or become injured.  Do not drive or use machinery.  Do not drink alcohol.  Do not take sleeping pills or medicines that cause drowsiness.  Do not make important decisions or sign legal documents.  Do not take care of children on your own.   Eating and drinking  Follow any instructions from your health care provider about eating or drinking restrictions.  When you feel hungry, start by eating small amounts of foods  that are soft and easy to digest (bland), such as toast. Gradually return to your regular diet.  Drink enough fluid to keep your urine pale yellow.  If you vomit, rehydrate by drinking water, juice, or clear broth. General instructions  If you have sleep apnea, surgery and certain medicines can increase your risk for breathing problems. Follow instructions from your health care provider about wearing your sleep device: ? Anytime you are sleeping, including during daytime naps. ? While taking prescription pain medicines, sleeping medicines, or medicines that make you drowsy.  Have a responsible adult stay with you for the time you are told. It is important to have someone help care for you until you are awake and alert.  Return to your normal activities as told by your health care provider. Ask your health care provider what activities are safe for you.  Take over-the-counter and prescription medicines only as told by your health care provider.  If you smoke, do not smoke without supervision.  Keep all follow-up visits as told by your health care provider. This is important. Contact a health care provider if:  You have nausea or vomiting that does not get better with medicine.  You cannot eat or drink without vomiting.  You  have pain that does not get better with medicine.  You are unable to pass urine.  You develop a skin rash.  You have a fever.  You have redness around your IV site that gets worse. Get help right away if:  You have difficulty breathing.  You have chest pain.  You have blood in your urine or stool, or you vomit blood. Summary  After the procedure, it is common to have a sore throat or nausea. It is also common to feel tired.  Have a responsible adult stay with you for the time you are told. It is important to have someone help care for you until you are awake and alert.  When you feel hungry, start by eating small amounts of foods that are soft and easy to digest (bland), such as toast. Gradually return to your regular diet.  Drink enough fluid to keep your urine pale yellow.  Return to your normal activities as told by your health care provider. Ask your health care provider what activities are safe for you. This information is not intended to replace advice given to you by your health care provider. Make sure you discuss any questions you have with your health care provider. Document Revised: 04/26/2020 Document Reviewed: 11/24/2019 Elsevier Patient Education  2021 [employer].

## 2020-09-24 ENCOUNTER — Encounter (HOSPITAL_COMMUNITY): Payer: Self-pay | Admitting: Anesthesiology

## 2020-09-24 ENCOUNTER — Other Ambulatory Visit: Payer: Self-pay

## 2020-09-24 ENCOUNTER — Encounter (HOSPITAL_COMMUNITY): Payer: Self-pay

## 2020-09-24 ENCOUNTER — Other Ambulatory Visit (HOSPITAL_COMMUNITY)
Admission: RE | Admit: 2020-09-24 | Discharge: 2020-09-24 | Disposition: A | Discharge status: Home / Self Care | Payer: Medicare Other | Source: Ambulatory Visit | Attending: Urology | Admitting: Urology

## 2020-09-24 ENCOUNTER — Encounter (HOSPITAL_COMMUNITY)
Admission: RE | Admit: 2020-09-24 | Discharge: 2020-09-24 | Disposition: A | Discharge status: Home / Self Care | Payer: Medicare Other | Source: Ambulatory Visit | Attending: Urology | Admitting: Urology

## 2020-09-24 DIAGNOSIS — Z01812 Encounter for preprocedural laboratory examination: Secondary | ICD-10-CM | POA: Insufficient documentation

## 2020-09-24 DIAGNOSIS — Z20822 Contact with and (suspected) exposure to covid-19: Secondary | ICD-10-CM | POA: Insufficient documentation

## 2020-09-24 HISTORY — DX: Unspecified hearing loss, unspecified ear: H91.90

## 2020-09-24 HISTORY — DX: Personal history of urinary calculi: Z87.442

## 2020-09-24 LAB — BASIC METABOLIC PANEL
Anion gap: 8 (ref 5–15)
BUN: 23 mg/dL (ref 8–23)
CO2: 25 mmol/L (ref 22–32)
Calcium: 9.4 mg/dL (ref 8.9–10.3)
Chloride: 104 mmol/L (ref 98–111)
Creatinine, Ser: 1.74 mg/dL — ABNORMAL HIGH (ref 0.61–1.24)
GFR, Estimated: 38 mL/min — ABNORMAL LOW (ref >60)
Glucose, Bld: 133 mg/dL — ABNORMAL HIGH (ref 70–99)
Potassium: 4.5 mmol/L (ref 3.5–5.1)
Sodium: 137 mmol/L (ref 135–145)

## 2020-09-24 LAB — HEMOGLOBIN A1C
Hgb A1c MFr Bld: 6.1 % — ABNORMAL HIGH (ref 4.8–5.6)
Mean Plasma Glucose: 128.37 mg/dL

## 2020-09-24 LAB — GLUCOSE, CAPILLARY: Glucose-Capillary: 127 mg/dL — ABNORMAL HIGH (ref 70–99)

## 2020-09-24 LAB — SARS CORONAVIRUS 2 (TAT 6-24 HRS): SARS Coronavirus 2: NEGATIVE

## 2020-09-24 NOTE — Pre-Procedure Instructions (Signed)
Daughter, [name], states Dr Alyson Ingles told her that he did not need to stop his Eliquis.

## 2020-09-25 DIAGNOSIS — N200 Calculus of kidney: Secondary | ICD-10-CM

## 2020-09-27 ENCOUNTER — Ambulatory Visit: Payer: Medicare Other | Admitting: Cardiology

## 2020-09-27 ENCOUNTER — Encounter: Payer: Self-pay | Admitting: Cardiology

## 2020-09-27 VITALS — BP 136/64 | HR 68 | Ht 71.0 in | Wt 198.8 lb

## 2020-09-27 DIAGNOSIS — I2510 Atherosclerotic heart disease of native coronary artery without angina pectoris: Secondary | ICD-10-CM | POA: Diagnosis not present

## 2020-09-27 DIAGNOSIS — N179 Acute kidney failure, unspecified: Secondary | ICD-10-CM | POA: Diagnosis not present

## 2020-09-27 DIAGNOSIS — I255 Ischemic cardiomyopathy: Secondary | ICD-10-CM

## 2020-09-27 MED ORDER — APIXABAN 5 MG PO TABS: 5.0000 mg | ORAL_TABLET | Freq: Two times a day (BID) | ORAL | 0 refills | Status: DC

## 2020-09-27 NOTE — Patient Instructions (Addendum)
Your physician recommends that you schedule a follow-up appointment in: [place] has recommended you make the following change in your medication:   STOP ALDACTONE AND CO Q 10  HOLD [place]  Your physician recommends that you return for lab work in: 1 WEEK BMP  Thank you for choosing [facility]!!
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
## 2020-10-01 ENCOUNTER — Telehealth: Payer: Self-pay

## 2020-10-01 NOTE — Telephone Encounter (Signed)
Called daughter- no answer. Left message to return call to have patient surgery rescheduled from where his previous surgery was postponed by [facility].

## 2020-10-02 ENCOUNTER — Ambulatory Visit (INDEPENDENT_AMBULATORY_CARE_PROVIDER_SITE_OTHER): Payer: Medicare Other | Admitting: Licensed Clinical Social Worker

## 2020-10-02 DIAGNOSIS — I25810 Atherosclerosis of coronary artery bypass graft(s) without angina pectoris: Secondary | ICD-10-CM

## 2020-10-02 DIAGNOSIS — E1142 Type 2 diabetes mellitus with diabetic polyneuropathy: Secondary | ICD-10-CM | POA: Diagnosis not present

## 2020-10-02 DIAGNOSIS — E785 Hyperlipidemia, unspecified: Secondary | ICD-10-CM

## 2020-10-02 DIAGNOSIS — C61 Malignant neoplasm of prostate: Secondary | ICD-10-CM | POA: Diagnosis not present

## 2020-10-02 DIAGNOSIS — G4733 Obstructive sleep apnea (adult) (pediatric): Secondary | ICD-10-CM

## 2020-10-02 DIAGNOSIS — I480 Paroxysmal atrial fibrillation: Secondary | ICD-10-CM | POA: Diagnosis not present

## 2020-10-02 DIAGNOSIS — E1169 Type 2 diabetes mellitus with other specified complication: Secondary | ICD-10-CM | POA: Diagnosis not present

## 2020-10-02 DIAGNOSIS — Z951 Presence of aortocoronary bypass graft: Secondary | ICD-10-CM

## 2020-10-02 NOTE — Chronic Care Management (AMB) (Signed)
Chronic Care Management    Clinical Social Work Follow Up Note  10/02/2020 Name: SANTEZ WOODCOX MRN: [id number] DOB: August 29, 1932  JEFFORY SNELGROVE is a 87 y.o. year old male who is a primary care patient of [name], DO. The CCM team was consulted for assistance with [name] .   Review of patient status, including review of consultants reports, other relevant assessments, and collaboration with appropriate care team members and the patient's provider was performed as part of comprehensive patient evaluation and provision of chronic care management services.    SDOH (Social Determinants of Health) assessments performed: No; risk for tobacco use; risk for depression; risk for stress; risk for physical inactivity  [place] Office Visit from 08/10/2020 in [facility]  PHQ-9 Total Score 0      GAD 7 : Generalized Anxiety Score 02/10/2020  Nervous, Anxious, on Edge 0  Control/stop worrying 0  Worry too much - different things 0  Trouble relaxing 0  Restless 0  Easily annoyed or irritable 0  Afraid - awful might happen 0  Total GAD 7 Score 0  Anxiety Difficulty Somewhat difficult    Outpatient Encounter Medications as of 10/02/2020  Medication Sig Note  . albuterol (PROVENTIL HFA;VENTOLIN HFA) 108 (90 Base) MCG/ACT inhaler Inhale 2 puffs into the lungs every 6 (six) hours as needed for wheezing or shortness of breath.   [name] amiodarone (PACERONE) 200 MG tablet Take 1 tablet (200 mg total) by mouth daily.   [name] apixaban (ELIQUIS) 5 MG TABS tablet Take 1 tablet (5 mg total) by mouth 2 (two) times daily.   [name] atorvastatin (LIPITOR) 80 MG tablet Take 1 tablet (80 mg total) by mouth daily.   [name] BESIVANCE 0.6 % SUSP Place 1 drop into both eyes daily.   . budesonide-formoterol (SYMBICORT) 160-4.5 MCG/ACT inhaler Inhale 2 puffs into the lungs 2 (two) times daily.   . furosemide (LASIX) 20 MG tablet Take 1 tablet (20 mg total) by mouth every other day.   [name]  glucose blood (ONETOUCH VERIO) test strip test blood sugars daily Dx E11.9   . Lancets (ONETOUCH ULTRASOFT) lancets Use to check blood sugars daily   . meclizine (ANTIVERT) 25 MG tablet Take 1 tablet (25 mg total) by mouth 3 (three) times daily as needed for dizziness.   . metFORMIN (GLUCOPHAGE) 500 MG tablet Take 1 tablet (500 mg total) by mouth 2 (two) times daily with a meal.   . metoprolol succinate (TOPROL-XL) 25 MG 24 hr tablet Take 0.5 tablets (12.5 mg total) by mouth daily.   . Misc Natural Products (OSTEO BI-FLEX ADV TRIPLE ST PO) Take 1 tablet by mouth daily. 09/17/2020: Per daughter "does not take all the time"  . Multiple Vitamin (MULTIVITAMIN) tablet Take 1 tablet by mouth daily.   [name] nystatin cream (MYCOSTATIN) Apply 1 application topically 2 (two) times daily. x7-14 days (groin) (Patient taking differently: Apply 1 application topically 2 (two) times daily as needed for dry skin. x7-14 days (groin))   . omeprazole (PRILOSEC) 20 MG capsule TAKE 1 CAPSULE BY MOUTH  DAILY (Patient taking differently: Take 20 mg by mouth daily. TAKE 1 CAPSULE BY MOUTH  DAILY)   . oxyCODONE-acetaminophen (PERCOCET/ROXICET) 5-325 MG tablet Take 0.5-1 tablets by mouth every 4 (four) hours as needed for moderate pain or severe pain.   . sacubitril-valsartan (ENTRESTO) 24-26 MG Take 1 tablet by mouth 2 (two) times daily.   [name] senna-docusate (SENOKOT-S) 8.6-50 MG tablet Take 1 tablet  by mouth at bedtime as needed for mild constipation or moderate constipation.   . tamsulosin (FLOMAX) 0.4 MG CAPS capsule Take 0.4 mg by mouth daily.    No facility-administered encounter medications on file as of 10/02/2020.    Goals    .  Client will talk with [name] in next 30 days about client management of health needs and clinet completion of daily activities (pt-stated)      CARE PLAN ENTRY  Current Barriers:  [name] Mobility issues of client with chronic diagnoses of CAD, Atrial Fibrillation, Diabetes, Prostate Cancer, HLD, s/p  CABG X 3, OSA . Vision challenges (wears glasses )  Clinical Social Work Clinical Goal(s):  [name] [name] will call client in next 30 days to talk about client management of health needs and client completion of daily activities   Interventions:  Talked with Neil Crouch, daughter of client, about client needs Talked with [name] about CCM program support Talked with [name] about  RNCM support with CCM program  Talked with [name] about pain issues of client  Talked with [name] about relaxation techniques of client (likes to listen to country music) Talked with [name] about social support network (has support from his daughters) Talked with [name] about transport needs of client Talked with [name] about meal provision of client Talked with [name] about ambulation needs of client  Talked with [name] about appetite of client Talked with [name] about decreased energy of client Talked with [name] about hearing needs of client Talked with [name] about kidney stone of client [name] is communicating with medical providers to try to get treatment set up for kidney stone of client)  Patient Self Care Activities:  Does ADLs independently (uses grab bars for shower) Eats meals independently   Patient Self Care Deficits:   [name] Mobility challenges (uses a cane to help him walk) . Vision challenges (wears glasses)  Initial goal documentation     Follow Up Plan:[name] to call client/ Neil Crouch,  daughter of client, in next 4 weeks to talk with him or [name] about his management of health needs and to talk about his completion of daily activities  [name].Rance Smithson MSW, [name] Licensed Clinical Social Worker [facility] [phone]

## 2020-10-02 NOTE — Telephone Encounter (Signed)
Pts drt called returning Amandas call about surgery. Explained she could call back Friday.

## 2020-10-02 NOTE — Patient Instructions (Addendum)
Licensed Clinical Social Worker Visit Information  Goals we discussed today:  .  Client will talk with [name] in next 30 days about client management of health needs and clinet completion of daily activities (pt-stated)        CARE PLAN ENTRY  Current Barriers:   Mobility issues of client with chronic diagnoses of CAD, Atrial Fibrillation, Diabetes, Prostate Cancer, HLD, s/p CABG X 3, OSA  Vision challenges (wears glasses )  Clinical Social Work Clinical Goal(s):   [name] will call client in next 30 days to talk about client management of health needs and client completion of daily activities   Interventions:  Talked with Neil Crouch, daughter of client, about client needs Talked with [name] about CCM program support Talked with [name] about  RNCM support with CCM program  Talked with [name] about pain issues of client  Talked with [name] about relaxation techniques of client (likes to listen to country music) Talked with [name] about social support network (has support from his daughters) Talked with [name] about transport needs of client Talked with [name] about meal provision of client Talked with [name] about ambulation needs of client  Talked with [name] about appetite of client Talked with [name] about decreased energy of client Talked with [name] about hearing needs of client Talked with [name] about kidney stone of client [name] is communicating with medical providers to try to get treatment set up for kidney stone of client)  Patient Self Care Activities:  Does ADLs independently (uses grab bars for shower) Eats meals independently   Patient Self Care Deficits:    Mobility challenges (uses a cane to help him walk)  Vision challenges (wears glasses)  Initial goal documentation     Follow Up Plan:[name] to call client/ Neil Crouch,  daughter of client, in next 4 weeks to talk with him or [name] about his management of health needs and to talk  about his completion of daily activities  Materials Provided: No  The patient [name] Sherrell Puller, daughter of client,verbalized understanding of instructions provided today and declined a print copy of patient instruction materials.   [name].Ryo Klang MSW, [name] Licensed Clinical Social Worker [facility] Care Management [phone]

## 2020-10-03 ENCOUNTER — Other Ambulatory Visit: Payer: Medicare Other | Admitting: Urology

## 2020-10-05 ENCOUNTER — Other Ambulatory Visit: Payer: Self-pay | Admitting: *Deleted

## 2020-10-05 ENCOUNTER — Other Ambulatory Visit: Payer: Medicare Other

## 2020-10-05 ENCOUNTER — Other Ambulatory Visit: Payer: Self-pay

## 2020-10-05 DIAGNOSIS — R0609 Other forms of dyspnea: Secondary | ICD-10-CM

## 2020-10-05 DIAGNOSIS — I5023 Acute on chronic systolic (congestive) heart failure: Secondary | ICD-10-CM

## 2020-10-05 DIAGNOSIS — R0600 Dyspnea, unspecified: Secondary | ICD-10-CM

## 2020-10-05 DIAGNOSIS — I10 Essential (primary) hypertension: Secondary | ICD-10-CM | POA: Diagnosis not present

## 2020-10-05 LAB — BASIC METABOLIC PANEL
BUN/Creatinine Ratio: 11 (ref 10–24)
BUN: 14 mg/dL (ref 8–27)
CO2: 23 mmol/L (ref 20–29)
Calcium: 9.2 mg/dL (ref 8.6–10.2)
Chloride: 104 mmol/L (ref 96–106)
Creatinine, Ser: 1.29 mg/dL — ABNORMAL HIGH (ref 0.76–1.27)
GFR calc Af Amer: 58 mL/min/{1.73_m2} — ABNORMAL LOW (ref >59)
GFR calc non Af Amer: 50 mL/min/{1.73_m2} — ABNORMAL LOW (ref >59)
Glucose: 143 mg/dL — ABNORMAL HIGH (ref 65–99)
Potassium: 4.5 mmol/L (ref 3.5–5.2)
Sodium: 140 mmol/L (ref 134–144)

## 2020-10-11 ENCOUNTER — Telehealth: Payer: Self-pay | Admitting: *Deleted

## 2020-10-11 NOTE — Telephone Encounter (Signed)
Pt daughter [name] [facility]) made aware

## 2020-10-11 NOTE — Telephone Encounter (Signed)
-----   Message from Arnoldo Lenis, MD sent at 10/11/2020 10:05 AM EST ----- Labs show kidney function much improved   Zandra Abts MD

## 2020-10-18 ENCOUNTER — Telehealth: Payer: Self-pay | Admitting: Cardiology

## 2020-10-18 ENCOUNTER — Inpatient Hospital Stay (HOSPITAL_COMMUNITY): Admission: RE | Admit: 2020-10-18 | Payer: Medicare Other | Source: Ambulatory Visit

## 2020-10-18 MED ORDER — SACUBITRIL-VALSARTAN 24-26 MG PO TABS: 1.0000 | ORAL_TABLET | Freq: Two times a day (BID) | ORAL | 3 refills | Status: DC

## 2020-10-18 NOTE — Telephone Encounter (Signed)
Pt daughter confirmed that he doesn't need Eliquis at this time, needs Entresto 24/26 which we do not have samples of currently - send rx refill into [facility] - will have Norvatis and BMS pt assistance applications available and pt daughter aware that they can come pick these up to hopefully help with medication cost

## 2020-10-18 NOTE — Telephone Encounter (Signed)
Patient calling the office for samples of medication:   1.  What medication and dosage are you requesting samples for?  ELIQUIS   2.  Are you currently out of this medication?  Has only 3 pills left     Please call [name]

## 2020-10-18 NOTE — Telephone Encounter (Signed)
New message    Patient daughter returning calling to [facility]

## 2020-10-19 ENCOUNTER — Other Ambulatory Visit (HOSPITAL_COMMUNITY)
Admission: RE | Admit: 2020-10-19 | Discharge: 2020-10-19 | Disposition: A | Discharge status: Home / Self Care | Payer: Medicare Other | Source: Ambulatory Visit | Attending: Urology | Admitting: Urology

## 2020-10-19 NOTE — Progress Notes (Signed)
Called patient's daughter, left a message with her voicemail. I asked her to call back Monday morning. Patient missed his covid appointment Friday, he will need to reschedule for Monday. His procedure is 10/23/2020.

## 2020-10-22 ENCOUNTER — Telehealth: Payer: Self-pay | Admitting: Urology

## 2020-10-22 ENCOUNTER — Telehealth: Payer: Self-pay

## 2020-10-22 NOTE — Telephone Encounter (Signed)
I called and spoke with daughter- see other telephone encounter

## 2020-10-22 NOTE — Telephone Encounter (Signed)
Pt's daughter called this morning to let us know that she wanted to cancel her father's lithotripsy.

## 2020-10-22 NOTE — Telephone Encounter (Signed)
Patient daughter, [name] called, wishing to cancel surgery for tomorrow. Daughter stated patient is no longer having pain and wishes to cancel.  Message sent to Dr. Alyson Ingles and OR scheduler, Hoyle Sauer.

## 2020-10-23 ENCOUNTER — Encounter (HOSPITAL_COMMUNITY): Admission: RE | Payer: Self-pay | Source: Home / Self Care

## 2020-10-23 ENCOUNTER — Ambulatory Visit (HOSPITAL_COMMUNITY): Admission: RE | Admit: 2020-10-23 | Payer: Medicare Other | Source: Home / Self Care | Admitting: Urology

## 2020-10-23 DIAGNOSIS — N200 Calculus of kidney: Secondary | ICD-10-CM

## 2020-10-23 SURGERY — CYSTOURETEROSCOPY, WITH RETROGRADE PYELOGRAM AND STENT INSERTION
Anesthesia: General | Laterality: Right

## 2020-10-25 DIAGNOSIS — N200 Calculus of kidney: Secondary | ICD-10-CM

## 2020-10-30 NOTE — Telephone Encounter (Signed)
Opened in error

## 2020-11-01 ENCOUNTER — Ambulatory Visit: Payer: Medicare Other | Admitting: Cardiology

## 2020-11-01 ENCOUNTER — Encounter: Payer: Self-pay | Admitting: Cardiology

## 2020-11-01 VITALS — BP 120/62 | HR 60 | Ht 71.0 in | Wt 205.0 lb

## 2020-11-01 DIAGNOSIS — I2510 Atherosclerotic heart disease of native coronary artery without angina pectoris: Secondary | ICD-10-CM | POA: Diagnosis not present

## 2020-11-01 DIAGNOSIS — I255 Ischemic cardiomyopathy: Secondary | ICD-10-CM | POA: Diagnosis not present

## 2020-11-01 DIAGNOSIS — I4891 Unspecified atrial fibrillation: Secondary | ICD-10-CM | POA: Diagnosis not present

## 2020-11-01 MED ORDER — SACUBITRIL-VALSARTAN 24-26 MG PO TABS: 1.0000 | ORAL_TABLET | Freq: Two times a day (BID) | ORAL | 0 refills | Status: DC

## 2020-11-01 MED ORDER — APIXABAN 5 MG PO TABS: 5.0000 mg | ORAL_TABLET | Freq: Two times a day (BID) | ORAL | 0 refills | Status: DC
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
## 2020-11-01 NOTE — Patient Instructions (Signed)
Your physician recommends that you schedule a follow-up appointment in: 4 MONTHS WITH DR BRANCH  Your physician recommends that you continue on your current medications as directed. Please refer to the Current Medication list given to you today.  Thank you for choosing [facility]!!    

## 2020-11-02 ENCOUNTER — Ambulatory Visit (INDEPENDENT_AMBULATORY_CARE_PROVIDER_SITE_OTHER): Payer: Medicare Other | Admitting: Licensed Clinical Social Worker

## 2020-11-02 ENCOUNTER — Other Ambulatory Visit: Payer: Medicare Other | Admitting: Urology

## 2020-11-02 DIAGNOSIS — E1142 Type 2 diabetes mellitus with diabetic polyneuropathy: Secondary | ICD-10-CM

## 2020-11-02 DIAGNOSIS — I480 Paroxysmal atrial fibrillation: Secondary | ICD-10-CM | POA: Diagnosis not present

## 2020-11-02 DIAGNOSIS — G4733 Obstructive sleep apnea (adult) (pediatric): Secondary | ICD-10-CM

## 2020-11-02 DIAGNOSIS — E538 Deficiency of other specified B group vitamins: Secondary | ICD-10-CM

## 2020-11-02 NOTE — Patient Instructions (Signed)
Visit Information  Goals Addressed    .  Self-Management Plan Developed: client needs help in managing ADLs completion and needs help with managing energy level (pt-stated)          Coping Skills Enhanced     Goal Name ; Coping with needs for ADLs completion and managing energy level of client  Timeframe: Short-Term   This Visit's Progress: On Track  Priority: Medium   Start Date: 11/02/2020 Expected End Date 02/02/2021  Attend Scheduled appointments Take medications as prescribed Complete ADLs daily Talk via phone with RNCM or [name] as needed for CCM support       Patient verbalizes understanding of instructions provided today and agrees to view in [place].   Telephone follow up appointment with care management team member scheduled for: 12/05/2020  [name].Thamar Holik MSW, [name] Licensed Clinical Social Worker [facility] Care Management [phone]

## 2020-11-02 NOTE — Chronic Care Management (AMB) (Signed)
Chronic Care Management    Clinical Social Work Note  11/02/2020 Name: [name] MRN: [id number] DOB: 1933-01-25  [name] is a 87 y.o. year old male who is a primary care patient of [name], DO. The CCM team was consulted to assist the patient with chronic disease management and/or care coordination needs related to: [name] .   Engaged with patient /daughter by telephone for follow up visit in response to provider referral for social work chronic care management and care coordination services.   Consent to Services:  The patient was given information about Chronic Care Management services, agreed to services, and gave verbal consent prior to initiation of services.  Please see initial visit note for detailed documentation.   Patient agreed to services and consent obtained.   Assessment: Review of patient past medical history, allergies, medications, and health status, including review of relevant consultants reports was performed today as part of a comprehensive evaluation and provision of chronic care management and care coordination services.     SDOH (Social Determinants of Health) assessments and interventions performed:  Yes Advanced Directives Status: See Vynca application for related entries.  CCM Care Plan  Allergies  Allergen Reactions   Tramadol Other (See Comments)    Dizzy   Ketorolac Tromethamine Rash    Outpatient Encounter Medications as of 11/02/2020  Medication Sig Note   albuterol (PROVENTIL HFA;VENTOLIN HFA) 108 (90 Base) MCG/ACT inhaler Inhale 2 puffs into the lungs every 6 (six) hours as needed for wheezing or shortness of breath.    amiodarone (PACERONE) 200 MG tablet Take 1 tablet (200 mg total) by mouth daily.    apixaban (ELIQUIS) 5 MG TABS tablet Take 1 tablet (5 mg total) by mouth 2 (two) times daily.    atorvastatin (LIPITOR) 80 MG tablet Take 1 tablet (80 mg total) by mouth daily.    BESIVANCE 0.6 % SUSP Place 1  drop into both eyes daily.    budesonide-formoterol (SYMBICORT) 160-4.5 MCG/ACT inhaler Inhale 2 puffs into the lungs 2 (two) times daily.    furosemide (LASIX) 20 MG tablet Take 1 tablet (20 mg total) by mouth every other day.    glucose blood (ONETOUCH VERIO) test strip test blood sugars daily Dx E11.9    Lancets (ONETOUCH ULTRASOFT) lancets Use to check blood sugars daily    meclizine (ANTIVERT) 25 MG tablet Take 1 tablet (25 mg total) by mouth 3 (three) times daily as needed for dizziness.    metFORMIN (GLUCOPHAGE) 500 MG tablet Take 1 tablet (500 mg total) by mouth 2 (two) times daily with a meal.    metoprolol succinate (TOPROL-XL) 25 MG 24 hr tablet Take 0.5 tablets (12.5 mg total) by mouth daily.    Misc Natural Products (OSTEO BI-FLEX ADV TRIPLE ST PO) Take 1 tablet by mouth daily. 09/17/2020: Per daughter "does not take all the time"   Multiple Vitamin (MULTIVITAMIN) tablet Take 1 tablet by mouth daily.    nystatin cream (MYCOSTATIN) Apply 1 application topically 2 (two) times daily. x7-14 days (groin) (Patient taking differently: Apply 1 application topically 2 (two) times daily as needed for dry skin. x7-14 days (groin))    omeprazole (PRILOSEC) 20 MG capsule TAKE 1 CAPSULE BY MOUTH  DAILY (Patient taking differently: Take 20 mg by mouth daily. TAKE 1 CAPSULE BY MOUTH  DAILY)    oxyCODONE-acetaminophen (PERCOCET/ROXICET) 5-325 MG tablet Take 0.5-1 tablets by mouth every 4 (four) hours as needed for moderate pain or severe pain.  sacubitril-valsartan (ENTRESTO) 24-26 MG Take 1 tablet by mouth 2 (two) times daily.    senna-docusate (SENOKOT-S) 8.6-50 MG tablet Take 1 tablet by mouth at bedtime as needed for mild constipation or moderate constipation.    tamsulosin (FLOMAX) 0.4 MG CAPS capsule Take 0.4 mg by mouth daily.    No facility-administered encounter medications on file as of 11/02/2020.    Patient Active Problem List   Diagnosis Date Noted   Kidney stones  07/04/2020   Hyperlipidemia associated with type 2 diabetes mellitus ([place]) 07/05/2019   Paroxysmal atrial fibrillation ([place]) 06/29/2019   Acquired thrombophilia ([place]) 06/29/2019   Second degree Mobitz II AV block 02/22/2019   B12 deficiency 12/18/2017   Status post right knee replacement 11/10/2016   S/P repair of abdominal aortic aneurysm using bifurcation graft 10/03/2016   Mixed incontinence 04/03/2015   PVC's (premature ventricular contractions) 06/05/2014   BMI 29.0-29.9,adult 65/10/5463   Diastolic dysfunction- grade 1 by echo June 2015, EF 50% 03/28/2014   OSA (obstructive sleep apnea) 12/07/2013   Sinus bradycardia- ? symptomatic 05/17/2013   Coronary artery disease involving nonautologous biological coronary bypass graft without angina pectoris    Thrombocytopenia (HCC)    S/P CABG x 3 08/21/2011   Hypertensive cardiovascular disease    Prostate cancer ([place])    Hyperlipidemia with target LDL less than 100    Diabetes ([place]) 11/28/2010    Conditions to be addressed/monitored: ; ADLs completion daily and management of energy level  Care Plan : General Social Work (Adult)  Updates made by Katha Cabal, [name] since 11/02/2020 12:00 AM   Problem: Coping Skills (General Plan of Care)     Goal: Coping Skills Enhanced   Start Date: 11/02/2020  Expected End Date: 02/02/2021  This Visit's Progress: On track  Priority: Medium  Note:   Current barriers:    Patient in need of assistance with connecting to community resources for in home support for ADLs completion   Acknowledges deficits with meeting this unmet need  Patient is unable to independently navigate community resource options without care coordination support  Decreased energy  Clinical Goals:   Patient will work with SW to address concerns related to  ADLs completion and management of energy level of client  Clinical Interventions:   Collaboration with [name], DO regarding  development and update of comprehensive plan of care as evidenced by provider attestation and co-signature  Inter-disciplinary care team collaboration (see longitudinal plan of care)  Assessment of needs, barriers  of client   Talked with daughter of patient about family support   Patient Goals:   Attend scheduled medical appointments Taked medications as prescribed Communicate with RNCM and [name] as needed -      Follow Up Plan: [name] will follow up with patient/daughter by phone on 12/05/2020       [name].Nate Perri MSW, [name] Licensed Clinical Social Worker [facility] Care Management [phone]

## 2020-11-09 ENCOUNTER — Other Ambulatory Visit: Payer: Self-pay

## 2020-11-09 ENCOUNTER — Ambulatory Visit (INDEPENDENT_AMBULATORY_CARE_PROVIDER_SITE_OTHER): Payer: Medicare Other | Admitting: Family Medicine

## 2020-11-09 ENCOUNTER — Encounter: Payer: Self-pay | Admitting: Family Medicine

## 2020-11-09 VITALS — BP 91/48 | HR 57 | Temp 97.5°F | Ht 71.0 in | Wt 204.0 lb

## 2020-11-09 DIAGNOSIS — I152 Hypertension secondary to endocrine disorders: Secondary | ICD-10-CM

## 2020-11-09 DIAGNOSIS — E1169 Type 2 diabetes mellitus with other specified complication: Secondary | ICD-10-CM

## 2020-11-09 DIAGNOSIS — E1159 Type 2 diabetes mellitus with other circulatory complications: Secondary | ICD-10-CM | POA: Diagnosis not present

## 2020-11-09 DIAGNOSIS — E1142 Type 2 diabetes mellitus with diabetic polyneuropathy: Secondary | ICD-10-CM | POA: Diagnosis not present

## 2020-11-09 DIAGNOSIS — I480 Paroxysmal atrial fibrillation: Secondary | ICD-10-CM | POA: Diagnosis not present

## 2020-11-09 DIAGNOSIS — I25810 Atherosclerosis of coronary artery bypass graft(s) without angina pectoris: Secondary | ICD-10-CM

## 2020-11-09 DIAGNOSIS — E785 Hyperlipidemia, unspecified: Secondary | ICD-10-CM

## 2020-11-09 MED ORDER — ONDANSETRON 4 MG PO TBDP: 4.0000 mg | ORAL_TABLET | Freq: Three times a day (TID) | ORAL | 0 refills | Status: DC | PRN
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
## 2020-11-09 NOTE — Patient Instructions (Signed)
You had labs performed today.  You will be contacted with the results of the labs once they are available, usually in the next 3 business days for routine lab work.  If you have an active my chart account, they will be released to your MyChart.  If you prefer to have these labs released to you via telephone, please let us know.  If you had a pap smear or biopsy performed, expect to be contacted in about 7-10 days.  Ok to go off omeprazole Zofran sent if needed for nausea during kidney stones

## 2020-11-10 LAB — RENAL FUNCTION PANEL
Albumin: 4.1 g/dL (ref 3.6–4.6)
BUN/Creatinine Ratio: 14 (ref 10–24)
BUN: 16 mg/dL (ref 8–27)
CO2: 25 mmol/L (ref 20–29)
Calcium: 9.4 mg/dL (ref 8.6–10.2)
Chloride: 106 mmol/L (ref 96–106)
Creatinine, Ser: 1.12 mg/dL (ref 0.76–1.27)
Glucose: 126 mg/dL — ABNORMAL HIGH (ref 65–99)
Phosphorus: 3.1 mg/dL (ref 2.8–4.1)
Potassium: 4.7 mmol/L (ref 3.5–5.2)
Sodium: 142 mmol/L (ref 134–144)
eGFR: 64 mL/min/{1.73_m2} (ref >59)

## 2020-11-10 LAB — LIPID PANEL
Chol/HDL Ratio: 3 ratio (ref 0.0–5.0)
Cholesterol, Total: 105 mg/dL (ref 100–199)
HDL: 35 mg/dL — ABNORMAL LOW (ref >39)
LDL Chol Calc (NIH): 53 mg/dL (ref 0–99)
Triglycerides: 85 mg/dL (ref 0–149)
VLDL Cholesterol Cal: 17 mg/dL (ref 5–40)

## 2020-11-10 LAB — TSH: TSH: 2.44 u[IU]/mL (ref 0.450–4.500)

## 2020-11-20 ENCOUNTER — Ambulatory Visit (INDEPENDENT_AMBULATORY_CARE_PROVIDER_SITE_OTHER): Payer: Medicare Other

## 2020-11-20 DIAGNOSIS — I441 Atrioventricular block, second degree: Secondary | ICD-10-CM | POA: Diagnosis not present

## 2020-11-20 LAB — CUP PACEART REMOTE DEVICE CHECK
Battery Remaining Longevity: 127 mo
Battery Remaining Percentage: 95.5 %
Battery Voltage: 3.01 V
Brady Statistic AP VP Percent: 31 %
Brady Statistic AP VS Percent: 1 %
Brady Statistic AS VP Percent: 66 %
Brady Statistic AS VS Percent: 1 %
Brady Statistic RA Percent Paced: 28 %
Brady Statistic RV Percent Paced: 97 %
Date Time Interrogation Session: 20220329020025
Implantable Lead Implant Date: 20200630
Implantable Lead Implant Date: 20200630
Implantable Lead Location: 753859
Implantable Lead Location: 753860
Implantable Pulse Generator Implant Date: 20200630
Lead Channel Impedance Value: 510 Ohm
Lead Channel Impedance Value: 650 Ohm
Lead Channel Pacing Threshold Amplitude: 0.625 V
Lead Channel Pacing Threshold Amplitude: 1 V
Lead Channel Pacing Threshold Pulse Width: 0.5 ms
Lead Channel Pacing Threshold Pulse Width: 0.5 ms
Lead Channel Sensing Intrinsic Amplitude: 0.6 mV
Lead Channel Sensing Intrinsic Amplitude: 12 mV
Lead Channel Setting Pacing Amplitude: 0.875
Lead Channel Setting Pacing Amplitude: 2 V
Lead Channel Setting Pacing Pulse Width: 0.5 ms
Lead Channel Setting Sensing Sensitivity: 4 mV
Pulse Gen Model: 2272
Pulse Gen Serial Number: 3311958

## 2020-12-04 NOTE — Progress Notes (Signed)
Remote pacemaker transmission.   

## 2020-12-05 ENCOUNTER — Telehealth: Payer: Medicare Other

## 2020-12-23 ENCOUNTER — Other Ambulatory Visit: Payer: Self-pay | Admitting: Cardiology

## 2020-12-28 ENCOUNTER — Telehealth: Payer: Self-pay | Admitting: Cardiology

## 2020-12-28 NOTE — Telephone Encounter (Signed)
New message    Patient calling the office for samples of medication:   1.  What medication and dosage are you requesting samples for?  eliquis and entresto  2.  Are you currently out of this medication?  Has a few left

## 2020-12-31 ENCOUNTER — Encounter: Payer: Self-pay | Admitting: *Deleted

## 2020-12-31 MED ORDER — ELIQUIS 5 MG PO TABS: 5.0000 mg | ORAL_TABLET | Freq: Two times a day (BID) | ORAL | 0 refills | Status: DC

## 2020-12-31 MED ORDER — SACUBITRIL-VALSARTAN 24-26 MG PO TABS: 1.0000 | ORAL_TABLET | Freq: Two times a day (BID) | ORAL | 0 refills | Status: DC

## 2020-12-31 NOTE — Telephone Encounter (Signed)
Daughter, Neil Crouch informed that samples available for pick up and also that we needed patient assistance applications returned to send to company. Verbalized understanding.

## 2021-01-09 ENCOUNTER — Telehealth: Payer: Medicare Other

## 2021-01-25 ENCOUNTER — Ambulatory Visit (INDEPENDENT_AMBULATORY_CARE_PROVIDER_SITE_OTHER): Payer: Medicare Other | Admitting: Family Medicine

## 2021-01-25 ENCOUNTER — Encounter: Payer: Self-pay | Admitting: Family Medicine

## 2021-01-25 DIAGNOSIS — M940 Chondrocostal junction syndrome [Tietze]: Secondary | ICD-10-CM | POA: Diagnosis not present

## 2021-01-25 DIAGNOSIS — J440 Chronic obstructive pulmonary disease with acute lower respiratory infection: Secondary | ICD-10-CM

## 2021-01-25 DIAGNOSIS — J209 Acute bronchitis, unspecified: Secondary | ICD-10-CM

## 2021-01-25 MED ORDER — PREDNISONE 20 MG PO TABS: 20.0000 mg | ORAL_TABLET | Freq: Every day | ORAL | 0 refills | Status: AC

## 2021-01-25 MED ORDER — AMOXICILLIN-POT CLAVULANATE 875-125 MG PO TABS: 1.0000 | ORAL_TABLET | Freq: Two times a day (BID) | ORAL | 0 refills | Status: DC

## 2021-01-25 NOTE — Progress Notes (Signed)
   Virtual Visit  Note Due to COVID-19 pandemic this visit was conducted virtually. This visit type was conducted due to national recommendations for restrictions regarding the COVID-19 Pandemic (e.g. social distancing, sheltering in place) in an effort to limit this patient's exposure and mitigate transmission in our community. All issues noted in this document were discussed and addressed.  A physical exam was not performed with this format.  I connected with [name] on 01/25/21 at 1536 by telephone and verified that I am speaking with the correct person using two identifiers. [name] is currently located at home and his wife is currently with him during the visit. The provider, Gwenlyn Perking, [name] is located in their office at time of visit.  I discussed the limitations, risks, security and privacy concerns of performing an evaluation and management service by telephone and the availability of in person appointments. I also discussed with the patient that there may be a patient responsible charge related to this service. The patient expressed understanding and agreed to proceed.  CC: cough  History and Present Illness:  HPI  History was provided by [name] and his wife. [name] has had a dry cough for 3 days. He is also having pain in his lower ribs with coughing. He does not have pain with a breathing. He has shortness of breath at baseline due to a history of COPD. He has been using his inhalers. He denies congestion, fever, sore throat, body aches, fever, or chills. Denies possibility of Covid exposure as he has not been outside the home.     ROS As per HPI.   Observations/Objective: Alert and oriented x 3. Able to speak in full sentences without difficulty. Coughing noted with verbalized pain response following.   Assessment and Plan: [name] was seen today for cough.  Diagnoses and all orders for this visit:  Costochondritis Prednisone burst as below.  -     predniSONE  (DELTASONE) 20 MG tablet; Take 1 tablet (20 mg total) by mouth daily for 3 days.  Acute bronchitis with COPD ([place]) Will treat with antibiotic given history of COPD with worsening cough. Continue prescribed inhalers. Prednisone burst given.  -     predniSONE (DELTASONE) 20 MG tablet; Take 1 tablet (20 mg total) by mouth daily for 3 days. -     amoxicillin-clavulanate (AUGMENTIN) 875-125 MG tablet; Take 1 tablet by mouth 2 (two) times daily for 7 days.     Follow Up Instructions: Return to office for new or worsening symptoms, or if symptoms persist.     I discussed the assessment and treatment plan with the patient. The patient was provided an opportunity to ask questions and all were answered. The patient agreed with the plan and demonstrated an understanding of the instructions.   The patient was advised to call back or seek an in-person evaluation if the symptoms worsen or if the condition fails to improve as anticipated.  The above assessment and management plan was discussed with the patient. The patient verbalized understanding of and has agreed to the management plan. Patient is aware to call the clinic if symptoms persist or worsen. Patient is aware when to return to the clinic for a follow-up visit. Patient educated on when it is appropriate to go to the emergency department.   Time call ended: 1548   I provided 12 minutes of  non face-to-face time during this encounter.    Gwenlyn Perking, [name]

## 2021-01-28 ENCOUNTER — Telehealth: Payer: Self-pay | Admitting: Family Medicine

## 2021-02-01 ENCOUNTER — Other Ambulatory Visit: Payer: Self-pay | Admitting: Family Medicine

## 2021-02-01 ENCOUNTER — Encounter: Payer: Self-pay | Admitting: Nurse Practitioner

## 2021-02-01 ENCOUNTER — Ambulatory Visit: Payer: Medicare Other | Admitting: Nurse Practitioner

## 2021-02-01 DIAGNOSIS — J069 Acute upper respiratory infection, unspecified: Secondary | ICD-10-CM

## 2021-02-01 DIAGNOSIS — U071 COVID-19: Secondary | ICD-10-CM | POA: Insufficient documentation

## 2021-02-01 MED ORDER — AMOXICILLIN-POT CLAVULANATE 875-125 MG PO TABS: 1.0000 | ORAL_TABLET | Freq: Two times a day (BID) | ORAL | 0 refills | Status: DC

## 2021-02-01 MED ORDER — PREDNISONE 10 MG (21) PO TBPK: ORAL_TABLET | ORAL | 0 refills | Status: DC

## 2021-02-01 NOTE — Progress Notes (Signed)
   Virtual Visit  Note Due to COVID-19 pandemic this visit was conducted virtually. This visit type was conducted due to national recommendations for restrictions regarding the COVID-19 Pandemic (e.g. social distancing, sheltering in place) in an effort to limit this patient's exposure and mitigate transmission in our community. All issues noted in this document were discussed and addressed.  A physical exam was not performed with this format.  I connected with [name] on 02/03/21 at 12:14 PM by telephone and verified that I am speaking with the correct person using two identifiers. [name] is currently located at home and wife is with patient during visit. The provider, Ivy Lynn, NP is located in their office at time of visit.  I discussed the limitations, risks, security and privacy concerns of performing an evaluation and management service by telephone and the availability of in person appointments. I also discussed with the patient that there may be a patient responsible charge related to this service. The patient expressed understanding and agreed to proceed.   History and Present Illness:  URI  This is a new problem. Episode onset: Last 48 hours. The problem has been unchanged. There has been no fever. Associated symptoms include congestion, coughing and headaches. Pertinent negatives include no abdominal pain, chest pain, ear pain, nausea, sinus pain, sore throat or swollen glands. He has tried nothing for the symptoms.     Review of Systems  HENT:  Positive for congestion. Negative for ear pain, sinus pain and sore throat.   Respiratory:  Positive for cough.   Cardiovascular:  Negative for chest pain.  Gastrointestinal:  Negative for abdominal pain and nausea.  Neurological:  Positive for headaches.    Observations/Objective: Televisit patient did not sound to be in distress.  Assessment and Plan: Take medication as prescribed, Tylenol for headache or fever,  increase hydration, Augmentin 875-125 mg tablet by mouth twice daily, prednisone taper, education provided to patient.  Rx sent to pharmacy.  Follow Up Instructions:  Follow-up with worsening signs and symptoms    I discussed the assessment and treatment plan with the patient. The patient was provided an opportunity to ask questions and all were answered. The patient agreed with the plan and demonstrated an understanding of the instructions.   The patient was advised to call back or seek an in-person evaluation if the symptoms worsen or if the condition fails to improve as anticipated.  The above assessment and management plan was discussed with the patient. The patient verbalized understanding of and has agreed to the management plan. Patient is aware to call the clinic if symptoms persist or worsen. Patient is aware when to return to the clinic for a follow-up visit. Patient educated on when it is appropriate to go to the emergency department.   Time call ended: 2:18 PM  I provided 6 minutes of  non face-to-face time during this encounter.    Ivy Lynn, NP

## 2021-02-12 DIAGNOSIS — Z961 Presence of intraocular lens: Secondary | ICD-10-CM | POA: Diagnosis not present

## 2021-02-12 DIAGNOSIS — E119 Type 2 diabetes mellitus without complications: Secondary | ICD-10-CM | POA: Diagnosis not present

## 2021-02-12 DIAGNOSIS — H353111 Nonexudative age-related macular degeneration, right eye, early dry stage: Secondary | ICD-10-CM | POA: Diagnosis not present

## 2021-02-14 ENCOUNTER — Telehealth: Payer: Self-pay | Admitting: Cardiology

## 2021-02-14 MED ORDER — SACUBITRIL-VALSARTAN 24-26 MG PO TABS: 1.0000 | ORAL_TABLET | Freq: Two times a day (BID) | ORAL | 0 refills | Status: DC

## 2021-02-14 MED ORDER — ELIQUIS 5 MG PO TABS: 5.0000 mg | ORAL_TABLET | Freq: Two times a day (BID) | ORAL | 0 refills | Status: DC

## 2021-02-14 NOTE — Telephone Encounter (Signed)
Patient calling the office for samples of medication:   1.  What medication and dosage are you requesting samples for? Entresto and eliquis   2.  Are you currently out of this medication? He is in the donut hole

## 2021-02-15 ENCOUNTER — Ambulatory Visit (INDEPENDENT_AMBULATORY_CARE_PROVIDER_SITE_OTHER): Payer: Medicare Other | Admitting: Cardiology

## 2021-02-15 ENCOUNTER — Encounter: Payer: Self-pay | Admitting: Cardiology

## 2021-02-15 ENCOUNTER — Other Ambulatory Visit: Payer: Self-pay

## 2021-02-15 VITALS — BP 134/78 | HR 60 | Ht 71.0 in | Wt 203.0 lb

## 2021-02-15 DIAGNOSIS — I5022 Chronic systolic (congestive) heart failure: Secondary | ICD-10-CM | POA: Diagnosis not present

## 2021-02-15 DIAGNOSIS — Z79899 Other long term (current) drug therapy: Secondary | ICD-10-CM

## 2021-02-15 DIAGNOSIS — I4891 Unspecified atrial fibrillation: Secondary | ICD-10-CM

## 2021-02-15 MED ORDER — SPIRONOLACTONE 25 MG PO TABS: 12.5000 mg | ORAL_TABLET | Freq: Every day | ORAL | 1 refills | Status: DC

## 2021-02-15 NOTE — Telephone Encounter (Signed)
Samples provided and explained to patient and daughter during visit today that office policy has changed and we are not allowed to give samples for maintenance medications. Advised to get patient assistance applications back to [place] to send in along with proof of income and out of pocket expense from pharmacy for 2022 prescriptions. Verbalized understanding

## 2021-02-15 NOTE — Patient Instructions (Addendum)
Medication Instructions:  Your physician has recommended you make the following change in your medication:  Start spironolactone 12.5 mg by mouth daily Continue other medications the same  Labwork: Your physician recommends that you return for non-fasting lab work in 2 weeks at your family doctor's office to check: BMET, Mg, TSH, CBC  Testing/Procedures: none  Follow-Up: Your physician recommends that you schedule a follow-up appointment in: 3 months  Any Other Special Instructions Will Be Listed Below (If Applicable).  If you need a refill on your cardiac medications before your next appointment, please call your pharmacy.
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
## 2021-02-19 ENCOUNTER — Ambulatory Visit (INDEPENDENT_AMBULATORY_CARE_PROVIDER_SITE_OTHER): Payer: Medicare Other

## 2021-02-19 DIAGNOSIS — I441 Atrioventricular block, second degree: Secondary | ICD-10-CM

## 2021-02-19 LAB — CUP PACEART REMOTE DEVICE CHECK
Battery Remaining Longevity: 101 mo
Battery Remaining Percentage: 81 %
Battery Voltage: 3.01 V
Brady Statistic AP VP Percent: 37 %
Brady Statistic AP VS Percent: 1 %
Brady Statistic AS VP Percent: 59 %
Brady Statistic AS VS Percent: 1 %
Brady Statistic RA Percent Paced: 34 %
Brady Statistic RV Percent Paced: 96 %
Date Time Interrogation Session: 20220628033815
Implantable Lead Implant Date: 20200630
Implantable Lead Implant Date: 20200630
Implantable Lead Location: 753859
Implantable Lead Location: 753860
Implantable Pulse Generator Implant Date: 20200630
Lead Channel Impedance Value: 540 Ohm
Lead Channel Impedance Value: 730 Ohm
Lead Channel Pacing Threshold Amplitude: 0.625 V
Lead Channel Pacing Threshold Amplitude: 1 V
Lead Channel Pacing Threshold Pulse Width: 0.5 ms
Lead Channel Pacing Threshold Pulse Width: 0.5 ms
Lead Channel Sensing Intrinsic Amplitude: 0.8 mV
Lead Channel Sensing Intrinsic Amplitude: 9.4 mV
Lead Channel Setting Pacing Amplitude: 0.875
Lead Channel Setting Pacing Amplitude: 2 V
Lead Channel Setting Pacing Pulse Width: 0.5 ms
Lead Channel Setting Sensing Sensitivity: 4 mV
Pulse Gen Model: 2272
Pulse Gen Serial Number: 3311958

## 2021-02-20 ENCOUNTER — Ambulatory Visit (INDEPENDENT_AMBULATORY_CARE_PROVIDER_SITE_OTHER): Payer: Medicare Other | Admitting: Licensed Clinical Social Worker

## 2021-02-20 DIAGNOSIS — E1169 Type 2 diabetes mellitus with other specified complication: Secondary | ICD-10-CM | POA: Diagnosis not present

## 2021-02-20 DIAGNOSIS — E538 Deficiency of other specified B group vitamins: Secondary | ICD-10-CM

## 2021-02-20 DIAGNOSIS — E1142 Type 2 diabetes mellitus with diabetic polyneuropathy: Secondary | ICD-10-CM | POA: Diagnosis not present

## 2021-02-20 DIAGNOSIS — E785 Hyperlipidemia, unspecified: Secondary | ICD-10-CM | POA: Diagnosis not present

## 2021-02-20 DIAGNOSIS — I480 Paroxysmal atrial fibrillation: Secondary | ICD-10-CM | POA: Diagnosis not present

## 2021-02-20 DIAGNOSIS — Z951 Presence of aortocoronary bypass graft: Secondary | ICD-10-CM

## 2021-02-20 DIAGNOSIS — G4733 Obstructive sleep apnea (adult) (pediatric): Secondary | ICD-10-CM

## 2021-02-20 DIAGNOSIS — I25810 Atherosclerosis of coronary artery bypass graft(s) without angina pectoris: Secondary | ICD-10-CM

## 2021-02-20 DIAGNOSIS — C61 Malignant neoplasm of prostate: Secondary | ICD-10-CM

## 2021-02-20 NOTE — Patient Instructions (Signed)
Visit Information  PATIENT GOALS:  Goals Addressed             This Visit's Progress    Manage My Emotions; Complete ADLs daily, as able       Timeframe:  Short-Term Goal Priority:  Medium Progress: On Track Start Date:             02/20/21                Expected End Date:           05/20/21            Follow Up Date 04/04/21   Manage Emotions: Complete ADLs daily, as able    Why is this important?   When you are stressed, down or upset, your body reacts too.  For example, your blood pressure may get higher; you may have a headache or stomachache.  When your emotions get the best of you, your body's ability to fight off cold and flu gets weak.  These steps will help you manage your emotions.     Patient Coping Skills:  Takes medications as prescribed Attends scheduled medical appointments Has family support from his daughters  Patient Deficits: Mobility issues Needs occasional help in completing ADLs  Patient Goals:   Attend scheduled medical appointments in next 30 days Taked medications as prescribed in next 30 days Communicate with RNCM and [name] as needed in next 30 days for CCM support -  Follow Up Plan: [name] to call client on 04/04/21 to discuss needs of client at that time    [name].Gracelee Stemmler MSW, [name] Licensed Clinical Social Worker [facility] Care Management [phone]

## 2021-02-20 NOTE — Chronic Care Management (AMB) (Signed)
Chronic Care Management    Clinical Social Work Note  02/20/2021 Name: [name] MRN: [id number] DOB: April 26, 1933  [name] is a 87 y.o. year old male who is a primary care patient of [name], DO. The CCM team was consulted to assist the patient with chronic disease management and/or care coordination needs related to: [name] .   Engaged with patient by telephone for follow up visit in response to provider referral for social work chronic care management and care coordination services.   Consent to Services:  The patient was given information about Chronic Care Management services, agreed to services, and gave verbal consent prior to initiation of services.  Please see initial visit note for detailed documentation.   Patient agreed to services and consent obtained.   Assessment: Review of patient past medical history, allergies, medications, and health status, including review of relevant consultants reports was performed today as part of a comprehensive evaluation and provision of chronic care management and care coordination services.     SDOH (Social Determinants of Health) assessments and interventions performed:  SDOH Interventions    Flowsheet Row Most Recent Value  SDOH Interventions   Depression Interventions/Treatment  --  [informed client of [name] support and or RNCM support]        Advanced Directives Status: See Vynca application for related entries.  CCM Care Plan  Allergies  Allergen Reactions   Tramadol Other (See Comments)    Dizzy   Ketorolac Tromethamine Rash    Outpatient Encounter Medications as of 02/20/2021  Medication Sig Note   albuterol (PROVENTIL HFA;VENTOLIN HFA) 108 (90 Base) MCG/ACT inhaler Inhale 2 puffs into the lungs every 6 (six) hours as needed for wheezing or shortness of breath.    amiodarone (PACERONE) 200 MG tablet TAKE 1 TABLET BY MOUTH  DAILY    apixaban (ELIQUIS) 5 MG TABS tablet Take 1 tablet (5 mg  total) by mouth 2 (two) times daily.    atorvastatin (LIPITOR) 80 MG tablet Take 1 tablet (80 mg total) by mouth daily.    BESIVANCE 0.6 % SUSP Place 1 drop into both eyes daily.    budesonide-formoterol (SYMBICORT) 160-4.5 MCG/ACT inhaler Inhale 2 puffs into the lungs 2 (two) times daily.    furosemide (LASIX) 20 MG tablet Take 1 tablet (20 mg total) by mouth every other day.    glucose blood (ONETOUCH VERIO) test strip test blood sugars daily Dx E11.9    Lancets (ONETOUCH ULTRASOFT) lancets Use to check blood sugars daily    meclizine (ANTIVERT) 25 MG tablet Take 1 tablet (25 mg total) by mouth 3 (three) times daily as needed for dizziness.    metFORMIN (GLUCOPHAGE) 500 MG tablet Take 1 tablet (500 mg total) by mouth 2 (two) times daily with a meal.    metoprolol succinate (TOPROL-XL) 25 MG 24 hr tablet Take 0.5 tablets (12.5 mg total) by mouth daily.    Misc Natural Products (OSTEO BI-FLEX ADV TRIPLE ST PO) Take 1 tablet by mouth daily. 09/17/2020: Per daughter "does not take all the time"   Multiple Vitamin (MULTIVITAMIN) tablet Take 1 tablet by mouth daily.    nystatin cream (MYCOSTATIN) Apply 1 application topically 2 (two) times daily. x7-14 days (groin) (Patient taking differently: Apply 1 application topically 2 (two) times daily as needed for dry skin. x7-14 days (groin))    omeprazole (PRILOSEC) 20 MG capsule TAKE 1 CAPSULE BY MOUTH  DAILY (Patient taking differently: Take 20 mg by mouth daily. TAKE  1 CAPSULE BY MOUTH  DAILY)    ondansetron (ZOFRAN ODT) 4 MG disintegrating tablet Take 1 tablet (4 mg total) by mouth every 8 (eight) hours as needed for nausea or vomiting.    sacubitril-valsartan (ENTRESTO) 24-26 MG Take 1 tablet by mouth 2 (two) times daily.    senna-docusate (SENOKOT-S) 8.6-50 MG tablet Take 1 tablet by mouth at bedtime as needed for mild constipation or moderate constipation.    spironolactone (ALDACTONE) 25 MG tablet Take 0.5 tablets (12.5 mg total) by mouth daily.     tamsulosin (FLOMAX) 0.4 MG CAPS capsule Take 0.4 mg by mouth daily.    No facility-administered encounter medications on file as of 02/20/2021.    Patient Active Problem List   Diagnosis Date Noted   COVID-19 virus RNA test result positive at limit of detection 02/01/2021   Upper respiratory infection with cough and congestion 02/01/2021   Kidney stones 07/04/2020   Hyperlipidemia associated with type 2 diabetes mellitus ([place]) 07/05/2019   Paroxysmal atrial fibrillation ([place]) 06/29/2019   Acquired thrombophilia ([place]) 06/29/2019   Second degree Mobitz II AV block 02/22/2019   B12 deficiency 12/18/2017   Status post right knee replacement 11/10/2016   S/P repair of abdominal aortic aneurysm using bifurcation graft 10/03/2016   Mixed incontinence 04/03/2015   PVC's (premature ventricular contractions) 06/05/2014   BMI 29.0-29.9,adult 17/40/8144   Diastolic dysfunction- grade 1 by echo June 2015, EF 50% 03/28/2014   OSA (obstructive sleep apnea) 12/07/2013   Sinus bradycardia- ? symptomatic 05/17/2013   Coronary artery disease involving nonautologous biological coronary bypass graft without angina pectoris    Thrombocytopenia (HCC)    S/P CABG x 3 08/21/2011   Hypertensive cardiovascular disease    Prostate cancer ([place])    Hyperlipidemia with target LDL less than 100    Diabetes ([place]) 11/28/2010    Conditions to be addressed/monitored: Monitor client completion of ADLs, as able  Care Plan : General Social Work (Adult)  Updates made by Katha Cabal, [name] since 02/20/2021 12:00 AM     Problem: Coping Skills (General Plan of Care)      Goal: Coping Skills Enhanced; Complete ADLs daily, as able   Start Date: 02/20/2021  Expected End Date: 05/20/2021  This Visit's Progress: On track  Recent Progress: On track  Priority: Medium  Note:   Current barriers:   Patient in need of assistance with connecting to community resources for in home support for ADLs completion  Patient is  unable to independently navigate community resource options without care coordination support Decreased energy Mobility issues  Clinical Goals:   Patient will work with SW monthy to address concerns related to  ADLs completion and management of energy level of client Patient will work with SW monthly to address concerns related to mobility issues of client  Clinical Interventions:  Collaboration with [name], DO regarding development and update of comprehensive plan of care as evidenced by provider attestation and co-signature Assessment of needs, barriers  of client  Talked with client about mobility of client (talked with client about recent fall, he said he had no serious injuries with last fall) Talked with client about pain issues of client Talked with client about medication procurement Talked with client about upcoming medical appointments (he said he has appointment with cardiologist in next 2 weeks) Encouraged client to call RNCM as needed for nursing support  Talked with client about decreased energy of client Talked with client about sleeping issues of client Talked with  client about ADLs completion of client Talked with client about meal provision of client Talked with client about family support (has support from his daughters) Talked with client about vision of client Talked with client about relaxation techniques (likes to sit on porch and rock, likes to visit with family and friends, likes to talk with friends on the phone, likes to be outdoors) Talked with client about breathing challenges of client (he said he uses inhaler as needed)  Patient Coping Skills:  Takes medications as prescribed Attends scheduled medical appointments Has family support from his daughters  Patient Deficits: Mobility issues Needs occasional help in completing ADLs  Patient Goals:   Attend scheduled medical appointments in next 30 days Taked medications as prescribed in next  30 days Communicate with RNCM and [name] as needed in next 30 days for CCM support -  Follow Up Plan: [name] to call client on 04/04/21 to discuss needs of client at that time     [name].Mikala Podoll MSW, [name] Licensed Clinical Social Worker [facility] Care Management [phone]

## 2021-02-26 ENCOUNTER — Telehealth: Payer: Self-pay

## 2021-02-26 MED ORDER — METOPROLOL SUCCINATE ER 25 MG PO TB24: 12.5000 mg | ORAL_TABLET | Freq: Every day | ORAL | 3 refills | Status: DC

## 2021-02-26 NOTE — Telephone Encounter (Signed)
Medication refill request for Metoprolol 25 mg (12.5 mg) tablets approved and sent to [facility]

## 2021-03-11 ENCOUNTER — Ambulatory Visit (INDEPENDENT_AMBULATORY_CARE_PROVIDER_SITE_OTHER): Payer: Medicare Other | Admitting: Family Medicine

## 2021-03-11 ENCOUNTER — Other Ambulatory Visit: Payer: Self-pay

## 2021-03-11 ENCOUNTER — Encounter: Payer: Self-pay | Admitting: Family Medicine

## 2021-03-11 ENCOUNTER — Other Ambulatory Visit: Payer: Self-pay | Admitting: Family Medicine

## 2021-03-11 VITALS — BP 130/71 | HR 75 | Temp 98.4°F | Ht 71.0 in | Wt 199.2 lb

## 2021-03-11 DIAGNOSIS — E1159 Type 2 diabetes mellitus with other circulatory complications: Secondary | ICD-10-CM | POA: Diagnosis not present

## 2021-03-11 DIAGNOSIS — E1142 Type 2 diabetes mellitus with diabetic polyneuropathy: Secondary | ICD-10-CM | POA: Diagnosis not present

## 2021-03-11 DIAGNOSIS — I152 Hypertension secondary to endocrine disorders: Secondary | ICD-10-CM

## 2021-03-11 DIAGNOSIS — I480 Paroxysmal atrial fibrillation: Secondary | ICD-10-CM

## 2021-03-11 DIAGNOSIS — L309 Dermatitis, unspecified: Secondary | ICD-10-CM | POA: Diagnosis not present

## 2021-03-11 DIAGNOSIS — D692 Other nonthrombocytopenic purpura: Secondary | ICD-10-CM

## 2021-03-11 DIAGNOSIS — E1169 Type 2 diabetes mellitus with other specified complication: Secondary | ICD-10-CM | POA: Diagnosis not present

## 2021-03-11 DIAGNOSIS — M791 Myalgia, unspecified site: Secondary | ICD-10-CM | POA: Diagnosis not present

## 2021-03-11 DIAGNOSIS — E785 Hyperlipidemia, unspecified: Secondary | ICD-10-CM

## 2021-03-11 DIAGNOSIS — Z1331 Encounter for screening for depression: Secondary | ICD-10-CM

## 2021-03-11 LAB — HEMOGLOBIN, FINGERSTICK: Hemoglobin: 13.4 g/dL (ref 12.6–17.7)

## 2021-03-11 LAB — BAYER DCA HB A1C WAIVED: HB A1C (BAYER DCA - WAIVED): 6.3 % (ref <7.0)

## 2021-03-11 MED ORDER — TRIAMCINOLONE ACETONIDE 0.1 % EX CREA: 1.0000 "application " | TOPICAL_CREAM | Freq: Two times a day (BID) | CUTANEOUS | 0 refills | Status: DC

## 2021-03-11 MED ORDER — ROSUVASTATIN CALCIUM 40 MG PO TABS: 40.0000 mg | ORAL_TABLET | Freq: Every day | ORAL | 3 refills | Status: DC

## 2021-03-11 NOTE — Progress Notes (Signed)
Remote pacemaker transmission.   
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
## 2021-03-12 ENCOUNTER — Ambulatory Visit (INDEPENDENT_AMBULATORY_CARE_PROVIDER_SITE_OTHER): Payer: Medicare Other

## 2021-03-12 ENCOUNTER — Ambulatory Visit: Payer: Medicare Other | Admitting: Cardiology

## 2021-03-12 VITALS — Ht 71.0 in | Wt 203.0 lb

## 2021-03-12 DIAGNOSIS — Z0000 Encounter for general adult medical examination without abnormal findings: Secondary | ICD-10-CM | POA: Diagnosis not present

## 2021-03-12 LAB — TSH: TSH: 1.79 u[IU]/mL (ref 0.450–4.500)
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
## 2021-03-12 NOTE — Patient Instructions (Signed)
[name] , Thank you for taking time to come for your Medicare Wellness Visit. I appreciate your ongoing commitment to your health goals. Please review the following plan we discussed and let me know if I can assist you in the future.   Screening recommendations/referrals: Colonoscopy: Done 10/01/2010 - Repeat not required Recommended yearly ophthalmology/optometry visit for glaucoma screening and checkup Recommended yearly dental visit for hygiene and checkup  Vaccinations: Influenza vaccine: Done 05/18/2020 - Repeat annually Pneumococcal vaccine: Done 10/24/2006 & 12/27/2014 Tdap vaccine: Done 07/19/2019 - Repeat in 10 years Shingles vaccine: Due.    Covid-19: Done 09/17/19, 10/08/19, & 06/27/2020  Advanced directives: Please bring a copy of your health care power of attorney and living will to the office to be added to your chart at your convenience.   Conditions/risks identified: Aim for 30 minutes of exercise or brisk walking each day, drink 6-8 glasses of water and eat lots of fruits and vegetables.   Next appointment: Follow up in one year for your annual wellness visit.   Preventive Care 85 Years and Older, Male  Preventive care refers to lifestyle choices and visits with your health care provider that can promote health and wellness. What does preventive care include? A yearly physical exam. This is also called an annual well check. Dental exams once or twice a year. Routine eye exams. Ask your health care provider how often you should have your eyes checked. Personal lifestyle choices, including: Daily care of your teeth and gums. Regular physical activity. Eating a healthy diet. Avoiding tobacco and drug use. Limiting alcohol use. Practicing safe sex. Taking low doses of aspirin every day. Taking vitamin and mineral supplements as recommended by your health care provider. What happens during an annual well check? The services and screenings done by your health care provider  during your annual well check will depend on your age, overall health, lifestyle risk factors, and family history of disease. Counseling  Your health care provider may ask you questions about your: Alcohol use. Tobacco use. Drug use. Emotional well-being. Home and relationship well-being. Sexual activity. Eating habits. History of falls. Memory and ability to understand (cognition). Work and work Statistician. Screening  You may have the following tests or measurements: Height, weight, and BMI. Blood pressure. Lipid and cholesterol levels. These may be checked every 5 years, or more frequently if you are over 61 years old. Skin check. Lung cancer screening. You may have this screening every year starting at age 75 if you have a 30-pack-year history of smoking and currently smoke or have quit within the past 15 years. Fecal occult blood test (FOBT) of the stool. You may have this test every year starting at age 79. Flexible sigmoidoscopy or colonoscopy. You may have a sigmoidoscopy every 5 years or a colonoscopy every 10 years starting at age 8. Prostate cancer screening. Recommendations will vary depending on your family history and other risks. Hepatitis C blood test. Hepatitis B blood test. Sexually transmitted disease (STD) testing. Diabetes screening. This is done by checking your blood sugar (glucose) after you have not eaten for a while (fasting). You may have this done every 1-3 years. Abdominal aortic aneurysm (AAA) screening. You may need this if you are a current or former smoker. Osteoporosis. You may be screened starting at age 89 if you are at high risk. Talk with your health care provider about your test results, treatment options, and if necessary, the need for more tests. Vaccines  Your health care provider may  recommend certain vaccines, such as: Influenza vaccine. This is recommended every year. Tetanus, diphtheria, and acellular pertussis (Tdap, Td) vaccine. You  may need a Td booster every 10 years. Zoster vaccine. You may need this after age 72. Pneumococcal 13-valent conjugate (PCV13) vaccine. One dose is recommended after age 36. Pneumococcal polysaccharide (PPSV23) vaccine. One dose is recommended after age 38. Talk to your health care provider about which screenings and vaccines you need and how often you need them. This information is not intended to replace advice given to you by your health care provider. Make sure you discuss any questions you have with your health care provider. Document Released: 09/07/2015 Document Revised: 04/30/2016 Document Reviewed: 06/12/2015 Elsevier Interactive Patient Education  2017 [place] Prevention in the Home Falls can cause injuries. They can happen to people of all ages. There are many things you can do to make your home safe and to help prevent falls. What can I do on the outside of my home? Regularly fix the edges of walkways and driveways and fix any cracks. Remove anything that might make you trip as you walk through a door, such as a raised step or threshold. Trim any bushes or trees on the path to your home. Use bright outdoor lighting. Clear any walking paths of anything that might make someone trip, such as rocks or tools. Regularly check to see if handrails are loose or broken. Make sure that both sides of any steps have handrails. Any raised decks and porches should have guardrails on the edges. Have any leaves, snow, or ice cleared regularly. Use sand or salt on walking paths during winter. Clean up any spills in your garage right away. This includes oil or grease spills. What can I do in the bathroom? Use night lights. Install grab bars by the toilet and in the tub and shower. Do not use towel bars as grab bars. Use non-skid mats or decals in the tub or shower. If you need to sit down in the shower, use a plastic, non-slip stool. Keep the floor dry. Clean up any water that spills  on the floor as soon as it happens. Remove soap buildup in the tub or shower regularly. Attach bath mats securely with double-sided non-slip rug tape. Do not have throw rugs and other things on the floor that can make you trip. What can I do in the bedroom? Use night lights. Make sure that you have a light by your bed that is easy to reach. Do not use any sheets or blankets that are too big for your bed. They should not hang down onto the floor. Have a firm chair that has side arms. You can use this for support while you get dressed. Do not have throw rugs and other things on the floor that can make you trip. What can I do in the kitchen? Clean up any spills right away. Avoid walking on wet floors. Keep items that you use a lot in easy-to-reach places. If you need to reach something above you, use a strong step stool that has a grab bar. Keep electrical cords out of the way. Do not use floor polish or wax that makes floors slippery. If you must use wax, use non-skid floor wax. Do not have throw rugs and other things on the floor that can make you trip. What can I do with my stairs? Do not leave any items on the stairs. Make sure that there are handrails on both sides of  the stairs and use them. Fix handrails that are broken or loose. Make sure that handrails are as long as the stairways. Check any carpeting to make sure that it is firmly attached to the stairs. Fix any carpet that is loose or worn. Avoid having throw rugs at the top or bottom of the stairs. If you do have throw rugs, attach them to the floor with carpet tape. Make sure that you have a light switch at the top of the stairs and the bottom of the stairs. If you do not have them, ask someone to add them for you. What else can I do to help prevent falls? Wear shoes that: Do not have high heels. Have rubber [place]. Are comfortable and fit you well. Are closed at the toe. Do not wear sandals. If you use a stepladder: Make  sure that it is fully opened. Do not climb a closed stepladder. Make sure that both sides of the stepladder are locked into place. Ask someone to hold it for you, if possible. Clearly mark and make sure that you can see: Any grab bars or handrails. First and last steps. Where the edge of each step is. Use tools that help you move around (mobility aids) if they are needed. These include: Canes. Walkers. Scooters. Crutches. Turn on the lights when you go into a dark area. Replace any light bulbs as soon as they burn out. Set up your furniture so you have a clear path. Avoid moving your furniture around. If any of your floors are uneven, fix them. If there are any pets around you, be aware of where they are. Review your medicines with your doctor. Some medicines can make you feel dizzy. This can increase your chance of falling. Ask your doctor what other things that you can do to help prevent falls. This information is not intended to replace advice given to you by your health care provider. Make sure you discuss any questions you have with your health care provider. Document Released: 06/07/2009 Document Revised: 01/17/2016 Document Reviewed: 09/15/2014 Elsevier Interactive Patient Education  2017 [employer].

## 2021-03-13 ENCOUNTER — Other Ambulatory Visit: Payer: Self-pay | Admitting: *Deleted

## 2021-03-13 MED ORDER — APIXABAN 5 MG PO TABS: 5.0000 mg | ORAL_TABLET | Freq: Two times a day (BID) | ORAL | 4 refills | Status: DC

## 2021-03-13 MED ORDER — SACUBITRIL-VALSARTAN 24-26 MG PO TABS: 1.0000 | ORAL_TABLET | Freq: Two times a day (BID) | ORAL | 4 refills | Status: DC

## 2021-03-15 ENCOUNTER — Telehealth: Payer: Self-pay | Admitting: Family Medicine

## 2021-03-15 NOTE — Telephone Encounter (Signed)
lmtcb

## 2021-03-18 ENCOUNTER — Telehealth: Payer: Self-pay | Admitting: *Deleted

## 2021-03-18 NOTE — Telephone Encounter (Signed)
Received fax from [place] approved till the end of the calendar year.  Will re-evaluate the patient's eligibility at that time to determine if they qualify for continued assistance.

## 2021-04-02 ENCOUNTER — Ambulatory Visit: Payer: Medicare Other | Admitting: Cardiology

## 2021-04-04 ENCOUNTER — Ambulatory Visit (INDEPENDENT_AMBULATORY_CARE_PROVIDER_SITE_OTHER): Payer: Medicare Other | Admitting: Licensed Clinical Social Worker

## 2021-04-04 DIAGNOSIS — E785 Hyperlipidemia, unspecified: Secondary | ICD-10-CM

## 2021-04-04 DIAGNOSIS — I480 Paroxysmal atrial fibrillation: Secondary | ICD-10-CM

## 2021-04-04 DIAGNOSIS — I25810 Atherosclerosis of coronary artery bypass graft(s) without angina pectoris: Secondary | ICD-10-CM | POA: Diagnosis not present

## 2021-04-04 DIAGNOSIS — E1169 Type 2 diabetes mellitus with other specified complication: Secondary | ICD-10-CM | POA: Diagnosis not present

## 2021-04-04 DIAGNOSIS — E538 Deficiency of other specified B group vitamins: Secondary | ICD-10-CM

## 2021-04-04 DIAGNOSIS — C61 Malignant neoplasm of prostate: Secondary | ICD-10-CM | POA: Diagnosis not present

## 2021-04-04 DIAGNOSIS — G4733 Obstructive sleep apnea (adult) (pediatric): Secondary | ICD-10-CM

## 2021-04-04 DIAGNOSIS — E1142 Type 2 diabetes mellitus with diabetic polyneuropathy: Secondary | ICD-10-CM

## 2021-04-04 DIAGNOSIS — Z951 Presence of aortocoronary bypass graft: Secondary | ICD-10-CM

## 2021-04-04 NOTE — Patient Instructions (Signed)
Visit Information  PATIENT GOALS:  Goals Addressed             This Visit's Progress    Manage My Emotions; Complete ADLs daily, as able       Timeframe:  Short-Term Goal Priority:  Medium Progress: On Track Start Date:            04/04/21               Expected End Date:          07/03/21            Follow Up Date 05/14/21   Manage Emotions: Complete ADLs daily, as able    Why is this important?   When you are stressed, down or upset, your body reacts too.  For example, your blood pressure may get higher; you may have a headache or stomachache.  When your emotions get the best of you, your body's ability to fight off cold and flu gets weak.  These steps will help you manage your emotions.     Patient Coping Skills:  Takes medications as prescribed Attends scheduled medical appointments Has family support from his daughters  Patient Deficits: Mobility issues Needs occasional help in completing ADLs  Patient Goals:   Attend scheduled medical appointments in next 30 days Taked medications as prescribed in next 30 days Communicate with RNCM and [name] as needed in next 30 days for CCM support -  Follow Up Plan: [name] to call client or his daughter on 05/14/21 to discuss needs of client at that time    [name].Calee Nugent MSW, [name] Licensed Clinical Social Worker [facility] Care Management [phone]

## 2021-04-04 NOTE — Chronic Care Management (AMB) (Signed)
Chronic Care Management    Clinical Social Work Note  04/04/2021 Name: [name] MRN: [id number] DOB: 21-May-1933  [name] is a 87 y.o. year old male who is a primary care patient of Calvin Norlander, DO. The CCM team was consulted to assist the patient with chronic disease management and/or care coordination needs related to: [name] .   Engaged with patient /daughter of patient, [name], by telephone for follow up visit in response to provider referral for social work chronic care management and care coordination services.   Consent to Services:  The patient was given information about Chronic Care Management services, agreed to services, and gave verbal consent prior to initiation of services.  Please see initial visit note for detailed documentation.   Patient agreed to services and consent obtained.   Assessment: Review of patient past medical history, allergies, medications, and health status, including review of relevant consultants reports was performed today as part of a comprehensive evaluation and provision of chronic care management and care coordination services.     SDOH (Social Determinants of Health) assessments and interventions performed: Client has decreased ambulation and uses a cane to help him walk.  He does socialize mostly with his family members and occasionally talks via phone with friends SDOH Interventions    Flowsheet Row Most Recent Value  SDOH Interventions   Depression Interventions/Treatment  Counseling        Advanced Directives Status: See [employer] application for related entries.  CCM Care Plan  Allergies  Allergen Reactions   Tramadol Other (See Comments)    Dizzy   Ketorolac Tromethamine Rash    Outpatient Encounter Medications as of 04/04/2021  Medication Sig Note   albuterol (PROVENTIL HFA;VENTOLIN HFA) 108 (90 Base) MCG/ACT inhaler Inhale 2 puffs into the lungs every 6 (six) hours as needed for wheezing or  shortness of breath.    amiodarone (PACERONE) 200 MG tablet TAKE 1 TABLET BY MOUTH  DAILY    apixaban (ELIQUIS) 5 MG TABS tablet Take 1 tablet (5 mg total) by mouth 2 (two) times daily.    BESIVANCE 0.6 % SUSP Place 1 drop into both eyes daily.    budesonide-formoterol (SYMBICORT) 160-4.5 MCG/ACT inhaler Inhale 2 puffs into the lungs 2 (two) times daily.    furosemide (LASIX) 20 MG tablet Take 1 tablet (20 mg total) by mouth every other day.    glucose blood (ONETOUCH VERIO) test strip test blood sugars daily Dx E11.9    Lancets (ONETOUCH ULTRASOFT) lancets Use to check blood sugars daily    meclizine (ANTIVERT) 25 MG tablet Take 1 tablet (25 mg total) by mouth 3 (three) times daily as needed for dizziness.    metFORMIN (GLUCOPHAGE) 500 MG tablet Take 1 tablet (500 mg total) by mouth 2 (two) times daily with a meal.    metoprolol succinate (TOPROL-XL) 25 MG 24 hr tablet Take 0.5 tablets (12.5 mg total) by mouth daily.    Misc Natural Products (OSTEO BI-FLEX ADV TRIPLE ST PO) Take 1 tablet by mouth daily. 09/17/2020: Per daughter "does not take all the time"   Multiple Vitamin (MULTIVITAMIN) tablet Take 1 tablet by mouth daily.    nystatin cream (MYCOSTATIN) Apply 1 application topically 2 (two) times daily. x7-14 days (groin) (Patient taking differently: Apply 1 application topically 2 (two) times daily as needed for dry skin. x7-14 days (groin))    omeprazole (PRILOSEC) 20 MG capsule TAKE 1 CAPSULE BY MOUTH  DAILY (Patient taking differently: Take 20  mg by mouth daily. TAKE 1 CAPSULE BY MOUTH  DAILY)    ondansetron (ZOFRAN ODT) 4 MG disintegrating tablet Take 1 tablet (4 mg total) by mouth every 8 (eight) hours as needed for nausea or vomiting.    rosuvastatin (CRESTOR) 40 MG tablet Take 1 tablet (40 mg total) by mouth daily. To REPLACE Atorvastatin    sacubitril-valsartan (ENTRESTO) 24-26 MG Take 1 tablet by mouth 2 (two) times daily.    senna-docusate (SENOKOT-S) 8.6-50 MG tablet Take 1 tablet by  mouth at bedtime as needed for mild constipation or moderate constipation.    spironolactone (ALDACTONE) 25 MG tablet Take 0.5 tablets (12.5 mg total) by mouth daily.    tamsulosin (FLOMAX) 0.4 MG CAPS capsule Take 0.4 mg by mouth daily.    triamcinolone cream (KENALOG) 0.1 % Apply 1 application topically 2 (two) times daily. X7 days per skin rash flare on legs    No facility-administered encounter medications on file as of 04/04/2021.    Patient Active Problem List   Diagnosis Date Noted   COVID-19 virus RNA test result positive at limit of detection 02/01/2021   Upper respiratory infection with cough and congestion 02/01/2021   Kidney stones 07/04/2020   Hyperlipidemia associated with type 2 diabetes mellitus ([place]) 07/05/2019   Paroxysmal atrial fibrillation ([place]) 06/29/2019   Acquired thrombophilia ([place]) 06/29/2019   Second degree Mobitz II AV block 02/22/2019   B12 deficiency 12/18/2017   Status post right knee replacement 11/10/2016   S/P repair of abdominal aortic aneurysm using bifurcation graft 10/03/2016   Mixed incontinence 04/03/2015   PVC's (premature ventricular contractions) 06/05/2014   BMI 29.0-29.9,adult [id number]   Diastolic dysfunction- grade 1 by echo June 2015, EF 50% 03/28/2014   OSA (obstructive sleep apnea) 12/07/2013   Sinus bradycardia- ? symptomatic 05/17/2013   Coronary artery disease involving nonautologous biological coronary bypass graft without angina pectoris    Thrombocytopenia (HCC)    S/P CABG x 3 08/21/2011   Hypertensive cardiovascular disease    Prostate cancer ([place])    Hyperlipidemia with target LDL less than 100    Diabetes ([place]) 11/28/2010    Conditions to be addressed/monitored: monitor client completion of ADLs.Monitor client management of depression issues faced   Care Plan : General Social Work (Adult)  Updates made by Katha Cabal, [name] since 04/04/2021 12:00 AM     Problem: Coping Skills (General Plan of Care)      Goal:  Coping Skills Enhanced; Complete ADLs daily, as able   Start Date: 04/04/2021  Expected End Date: 07/03/2021  This Visit's Progress: On track  Recent Progress: On track  Priority: Medium  Note:   Current barriers:   Patient in need of assistance with connecting to community resources for in home support for ADLs completion  Patient is unable to independently navigate community resource options without care coordination support Decreased energy Mobility issues Depression issues  Clinical Goals:   Patient will work with SW monthy to address concerns related to  ADLs completion and management of energy level of client Patient will work with SW monthly to address concerns related to mobility issues of client Patient will communicate with SW in next 30 days to discuss depression issues of client and client management of depression issues  Clinical Interventions:  Collaboration with Calvin Norlander, DO regarding development and update of comprehensive plan of care as evidenced by provider attestation and co-signature Talked with daughter of client, [name], today about client needs Talked with Denisea about  pain issues of client Talked with Denisea about medication procurement of client  Talked with Denisea about upcoming medical appointments for client  Encouraged client /daughter of client to call RNCM as needed for nursing support for client  Talked with Denisea about decreased energy of client Talked with Denisea about sleeping issues of client Talked with Denisea about family support for client ( he has support from his daughters) Talked with Denisea about relaxation techniques of client  ( client likes to sit on porch and rock, likes to visit with family and friends, likes to talk with friends on the phone, likes to be outdoors, likes to mow grass at his home) Talked with Denisea about mood of client (she said client gets sad occasionally. She said client was very active most of  his life, worked hard and she feels that he may get a little sad sometimes because he is not working like he used to earlier in his life.  He still drives short distances and socializes with family and friends But Denisea feels that he may feel decreased self worth since he is not working and providing for family as he did in the past.  [name] talked with Denisea about helping client do activities with family and friends such as going on a day trip, having a picnic, traveling to somewhere different to allow client to have a change of pace.  Denisea said client is sleeping well and eating well  Patient Coping Skills:  Takes medications as prescribed Attends scheduled medical appointments Has family support from his daughters  Patient Deficits: Mobility issues Needs occasional help in completing ADLs  Patient Goals:   Attend scheduled medical appointments in next 30 days Taked medications as prescribed in next 30 days Communicate with RNCM and [name] as needed in next 30 days for CCM support -  Follow Up Plan: [name] to call client or daughter of client on 05/14/21 to discuss needs of client at that time    [name].Deserai Cansler MSW, [name] Licensed Clinical Social Worker [facility] Care Management [phone]

## 2021-04-18 ENCOUNTER — Ambulatory Visit (INDEPENDENT_AMBULATORY_CARE_PROVIDER_SITE_OTHER): Payer: Medicare Other | Admitting: Family Medicine

## 2021-04-18 ENCOUNTER — Other Ambulatory Visit: Payer: Self-pay

## 2021-04-18 ENCOUNTER — Encounter: Payer: Self-pay | Admitting: Family Medicine

## 2021-04-18 VITALS — BP 88/49 | HR 60 | Temp 98.2°F | Ht 71.0 in | Wt 198.1 lb

## 2021-04-18 DIAGNOSIS — G8929 Other chronic pain: Secondary | ICD-10-CM

## 2021-04-18 DIAGNOSIS — M5441 Lumbago with sciatica, right side: Secondary | ICD-10-CM

## 2021-04-18 MED ORDER — LIDOCAINE 5 % EX OINT
1.0000 | TOPICAL_OINTMENT | CUTANEOUS | 0 refills | Status: DC | PRN
Start: 2021-04-18 — End: 2021-06-27

## 2021-04-18 NOTE — Patient Instructions (Addendum)
Hold lasix dosage tonight. Call office if top number on blood pressures is 100 or less.  Call [facility] for back injection at [phone]  Chronic Back Pain When back pain lasts longer than 3 months, it is called chronic back pain. The cause of your back pain may not be known. Some common causes include: Wear and tear (degenerative disease) of the bones, ligaments, or disks in your back. Inflammation and stiffness in your back (arthritis). People who have chronic back pain often go through certain periods in which the pain is more intense (flare-ups). Many people can learn to manage the pain with home care. Follow these instructions at home: Pay attention to any changes in your symptoms. Take these actions to help withyour pain: Managing pain and stiffness     If directed, apply ice to the painful area. Your health care provider may recommend applying ice during the first 24-48 hours after a flare-up begins. To do this: Put ice in a plastic bag. Place a towel between your skin and the bag. Leave the ice on for 20 minutes, 2-3 times per day. If directed, apply heat to the affected area as often as told by your health care provider. Use the heat source that your health care provider recommends, such as a moist heat pack or a heating pad. Place a towel between your skin and the heat source. Leave the heat on for 20-30 minutes. Remove the heat if your skin turns bright red. This is especially important if you are unable to feel pain, heat, or cold. You may have a greater risk of getting burned. Try soaking in a warm tub. Activity  Avoid bending and other activities that make the problem worse. Maintain a proper position when standing or sitting: When standing, keep your upper back and neck straight, with your shoulders pulled back. Avoid slouching. When sitting, keep your back straight and relax your shoulders. Do not round your shoulders or pull them backward. Do not sit or  stand in one place for long periods of time. Take brief periods of rest throughout the day. This will reduce your pain. Resting in a lying or standing position is usually better than sitting to rest. When you are resting for longer periods, mix in some mild activity or stretching between periods of rest. This will help to prevent stiffness and pain. Get regular exercise. Ask your health care provider what activities are safe for you. Do not lift anything that is heavier than 10 lb (4.5 kg), or the limit that you are told, until your health care provider says that it is safe. Always use proper lifting technique, which includes: Bending your knees. Keeping the load close to your body. Avoiding twisting. Sleep on a firm mattress in a comfortable position. Try lying on your side with your knees slightly bent. If you lie on your back, put a pillow under your knees.  Medicines Treatment may include medicines for pain and inflammation taken by mouth or applied to the skin, prescription pain medicine, or muscle relaxants. Take over-the-counter and prescription medicines only as told by your health care provider. Ask your health care provider if the medicine prescribed to you: Requires you to avoid driving or using machinery. Can cause constipation. You may need to take these actions to prevent or treat constipation: Drink enough fluid to keep your urine pale yellow. Take over-the-counter or prescription medicines. Eat foods that are high in fiber, such as beans, whole grains, and fresh fruits and vegetables. Limit  foods that are high in fat and processed sugars, such as fried or sweet foods. General instructions Do not use any products that contain nicotine or tobacco, such as cigarettes, e-cigarettes, and chewing tobacco. If you need help quitting, ask your health care provider. Keep all follow-up visits as told by your health care provider. This is important. Contact a health care provider if: You  have pain that is not relieved with rest or medicine. Your pain gets worse, or you have new pain. You have a high fever. You have rapid weight loss. You have trouble doing your normal activities. Get help right away if: You have weakness or numbness in one or both of your legs or feet. You have trouble controlling your bladder or your bowels. You have severe back pain and have any of the following: Nausea or vomiting. Pain in your abdomen. Shortness of breath or you faint. Summary Chronic back pain is back pain that lasts longer than 3 months. When a flare-up begins, apply ice to the painful area for the first 24-48 hours. Apply a moist heat pad or use a heating pad on the painful area as directed by your health care provider. When you are resting for longer periods, mix in some mild activity or stretching between periods of rest. This will help to prevent stiffness and pain. This information is not intended to replace advice given to you by your health care provider. Make sure you discuss any questions you have with your healthcare provider. Document Revised: 09/21/2019 Document Reviewed: 09/21/2019 Elsevier Patient Education  2022 [employer].
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
## 2021-04-19 ENCOUNTER — Telehealth: Payer: Self-pay | Admitting: *Deleted

## 2021-04-19 NOTE — Telephone Encounter (Signed)
Call placed to [id number] - Eliquis approved 03/14/2021 thru 08/24/2021.  Received first order on 03/22/2021.

## 2021-05-13 ENCOUNTER — Emergency Department (HOSPITAL_BASED_OUTPATIENT_CLINIC_OR_DEPARTMENT_OTHER): Payer: Medicare Other

## 2021-05-13 ENCOUNTER — Other Ambulatory Visit: Payer: Self-pay | Admitting: Family Medicine

## 2021-05-13 ENCOUNTER — Other Ambulatory Visit: Payer: Self-pay

## 2021-05-13 ENCOUNTER — Encounter (HOSPITAL_BASED_OUTPATIENT_CLINIC_OR_DEPARTMENT_OTHER): Payer: Self-pay

## 2021-05-13 ENCOUNTER — Emergency Department (HOSPITAL_BASED_OUTPATIENT_CLINIC_OR_DEPARTMENT_OTHER)
Admission: EM | Admit: 2021-05-13 | Discharge: 2021-05-13 | Disposition: A | Discharge status: Home / Self Care | Payer: Medicare Other | Attending: Emergency Medicine | Admitting: Emergency Medicine

## 2021-05-13 DIAGNOSIS — R1110 Vomiting, unspecified: Secondary | ICD-10-CM | POA: Diagnosis not present

## 2021-05-13 DIAGNOSIS — D72829 Elevated white blood cell count, unspecified: Secondary | ICD-10-CM | POA: Insufficient documentation

## 2021-05-13 DIAGNOSIS — Z87891 Personal history of nicotine dependence: Secondary | ICD-10-CM | POA: Insufficient documentation

## 2021-05-13 DIAGNOSIS — Z7951 Long term (current) use of inhaled steroids: Secondary | ICD-10-CM | POA: Diagnosis not present

## 2021-05-13 DIAGNOSIS — R42 Dizziness and giddiness: Secondary | ICD-10-CM | POA: Insufficient documentation

## 2021-05-13 DIAGNOSIS — R1084 Generalized abdominal pain: Secondary | ICD-10-CM | POA: Insufficient documentation

## 2021-05-13 DIAGNOSIS — Z79899 Other long term (current) drug therapy: Secondary | ICD-10-CM | POA: Diagnosis not present

## 2021-05-13 DIAGNOSIS — J449 Chronic obstructive pulmonary disease, unspecified: Secondary | ICD-10-CM | POA: Diagnosis not present

## 2021-05-13 DIAGNOSIS — J45909 Unspecified asthma, uncomplicated: Secondary | ICD-10-CM | POA: Insufficient documentation

## 2021-05-13 DIAGNOSIS — Z950 Presence of cardiac pacemaker: Secondary | ICD-10-CM | POA: Diagnosis not present

## 2021-05-13 DIAGNOSIS — Z951 Presence of aortocoronary bypass graft: Secondary | ICD-10-CM | POA: Diagnosis not present

## 2021-05-13 DIAGNOSIS — Z8616 Personal history of COVID-19: Secondary | ICD-10-CM | POA: Diagnosis not present

## 2021-05-13 DIAGNOSIS — E1142 Type 2 diabetes mellitus with diabetic polyneuropathy: Secondary | ICD-10-CM

## 2021-05-13 DIAGNOSIS — N390 Urinary tract infection, site not specified: Secondary | ICD-10-CM | POA: Diagnosis not present

## 2021-05-13 DIAGNOSIS — Z7984 Long term (current) use of oral hypoglycemic drugs: Secondary | ICD-10-CM | POA: Insufficient documentation

## 2021-05-13 DIAGNOSIS — I10 Essential (primary) hypertension: Secondary | ICD-10-CM | POA: Insufficient documentation

## 2021-05-13 DIAGNOSIS — I2510 Atherosclerotic heart disease of native coronary artery without angina pectoris: Secondary | ICD-10-CM | POA: Insufficient documentation

## 2021-05-13 DIAGNOSIS — E119 Type 2 diabetes mellitus without complications: Secondary | ICD-10-CM | POA: Diagnosis not present

## 2021-05-13 DIAGNOSIS — Z85820 Personal history of malignant melanoma of skin: Secondary | ICD-10-CM | POA: Insufficient documentation

## 2021-05-13 DIAGNOSIS — Z8546 Personal history of malignant neoplasm of prostate: Secondary | ICD-10-CM | POA: Diagnosis not present

## 2021-05-13 DIAGNOSIS — R112 Nausea with vomiting, unspecified: Secondary | ICD-10-CM | POA: Diagnosis not present

## 2021-05-13 DIAGNOSIS — Z7901 Long term (current) use of anticoagulants: Secondary | ICD-10-CM | POA: Diagnosis not present

## 2021-05-13 LAB — CBC WITH DIFFERENTIAL/PLATELET
Abs Immature Granulocytes: 0.03 10*3/uL (ref 0.00–0.07)
Basophils Absolute: 0 10*3/uL (ref 0.0–0.1)
Basophils Relative: 0 %
Eosinophils Absolute: 0 10*3/uL (ref 0.0–0.5)
Eosinophils Relative: 0 %
HCT: 37.5 % — ABNORMAL LOW (ref 39.0–52.0)
Hemoglobin: 12.1 g/dL — ABNORMAL LOW (ref 13.0–17.0)
Immature Granulocytes: 1 %
Lymphocytes Relative: 12 %
Lymphs Abs: 0.6 10*3/uL — ABNORMAL LOW (ref 0.7–4.0)
MCH: 33 pg (ref 26.0–34.0)
MCHC: 32.3 g/dL (ref 30.0–36.0)
MCV: 102.2 fL — ABNORMAL HIGH (ref 80.0–100.0)
Monocytes Absolute: 0.3 10*3/uL (ref 0.1–1.0)
Monocytes Relative: 6 %
Neutro Abs: 3.8 10*3/uL (ref 1.7–7.7)
Neutrophils Relative %: 81 %
Platelets: 73 10*3/uL — ABNORMAL LOW (ref 150–400)
RBC: 3.67 MIL/uL — ABNORMAL LOW (ref 4.22–5.81)
RDW: 15.6 % — ABNORMAL HIGH (ref 11.5–15.5)
WBC: 4.7 10*3/uL (ref 4.0–10.5)
nRBC: 0 % (ref 0.0–0.2)

## 2021-05-13 LAB — CBG MONITORING, ED: Glucose-Capillary: 144 mg/dL — ABNORMAL HIGH (ref 70–99)

## 2021-05-13 LAB — URINALYSIS, ROUTINE W REFLEX MICROSCOPIC
Bilirubin Urine: NEGATIVE
Glucose, UA: NEGATIVE mg/dL
Ketones, ur: NEGATIVE mg/dL
Nitrite: NEGATIVE
Protein, ur: 30 mg/dL — AB
Specific Gravity, Urine: >1.046 — ABNORMAL HIGH (ref 1.005–1.030)
WBC, UA: >50 WBC/hpf — ABNORMAL HIGH (ref 0–5)
pH: 8 (ref 5.0–8.0)

## 2021-05-13 LAB — COMPREHENSIVE METABOLIC PANEL
ALT: 15 U/L (ref 0–44)
AST: 16 U/L (ref 15–41)
Albumin: 3.7 g/dL (ref 3.5–5.0)
Alkaline Phosphatase: 60 U/L (ref 38–126)
Anion gap: 6 (ref 5–15)
BUN: 20 mg/dL (ref 8–23)
CO2: 27 mmol/L (ref 22–32)
Calcium: 9.6 mg/dL (ref 8.9–10.3)
Chloride: 106 mmol/L (ref 98–111)
Creatinine, Ser: 1.15 mg/dL (ref 0.61–1.24)
GFR, Estimated: >60 mL/min (ref >60)
Glucose, Bld: 187 mg/dL — ABNORMAL HIGH (ref 70–99)
Potassium: 5.1 mmol/L (ref 3.5–5.1)
Sodium: 139 mmol/L (ref 135–145)
Total Bilirubin: 0.8 mg/dL (ref 0.3–1.2)
Total Protein: 6 g/dL — ABNORMAL LOW (ref 6.5–8.1)

## 2021-05-13 LAB — TROPONIN I (HIGH SENSITIVITY)
Troponin I (High Sensitivity): 9 ng/L (ref <18)
Troponin I (High Sensitivity): 15 ng/L (ref <18)

## 2021-05-13 LAB — LIPASE, BLOOD: Lipase: 69 U/L — ABNORMAL HIGH (ref 11–51)

## 2021-05-13 MED ORDER — ONDANSETRON 4 MG PO TBDP: 4.0000 mg | ORAL_TABLET | Freq: Three times a day (TID) | ORAL | 0 refills | Status: DC | PRN

## 2021-05-13 MED ORDER — IOHEXOL 350 MG/ML SOLN
100.0000 mL | Freq: Once | INTRAVENOUS | Status: AC | PRN
  Administered 2021-05-13: 75 mL via INTRAVENOUS

## 2021-05-13 MED ORDER — SODIUM CHLORIDE 0.9 % IV BOLUS
500.0000 mL | Freq: Once | INTRAVENOUS | Status: AC
  Administered 2021-05-13: 500 mL via INTRAVENOUS

## 2021-05-13 MED ORDER — MECLIZINE HCL 25 MG PO TABS: 25.0000 mg | ORAL_TABLET | Freq: Three times a day (TID) | ORAL | 0 refills | Status: DC | PRN

## 2021-05-13 MED ORDER — MECLIZINE HCL 25 MG PO TABS
25.0000 mg | ORAL_TABLET | Freq: Once | ORAL | Status: AC
  Administered 2021-05-13: 25 mg via ORAL
  Filled 2021-05-13 (×2): qty 1

## 2021-05-13 MED ORDER — ONDANSETRON HCL 4 MG/2ML IJ SOLN
4.0000 mg | Freq: Once | INTRAMUSCULAR | Status: AC
  Administered 2021-05-13: 4 mg via INTRAVENOUS
  Filled 2021-05-13: qty 2

## 2021-05-13 MED ORDER — CEPHALEXIN 500 MG PO CAPS: 500.0000 mg | ORAL_CAPSULE | Freq: Two times a day (BID) | ORAL | 0 refills | Status: AC

## 2021-05-13 NOTE — Discharge Instructions (Addendum)
You were given contrast today, so do NOT take your Metformin for 48 hours  If you develop new or worsening dizziness, inability to walk, weakness or numbness in your extremities, headache, vomiting, or any other new/concerning symptoms return to the ER for patient.
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
## 2021-05-13 NOTE — ED Triage Notes (Signed)
Pt arrives via [facility].  Per EMS, pt woke up with dizziness and vomiting at 4 am.

## 2021-05-13 NOTE — ED Notes (Signed)
RT Note: CBG blood Glucose obtained via Finger stick, RN aware.

## 2021-05-14 ENCOUNTER — Telehealth: Payer: Medicare Other

## 2021-05-16 LAB — URINE CULTURE: Culture: >=100000 — AB

## 2021-05-18 ENCOUNTER — Telehealth: Payer: Self-pay | Admitting: Emergency Medicine

## 2021-05-21 ENCOUNTER — Encounter: Payer: Self-pay | Admitting: Cardiology

## 2021-05-21 ENCOUNTER — Ambulatory Visit (INDEPENDENT_AMBULATORY_CARE_PROVIDER_SITE_OTHER): Payer: Medicare Other

## 2021-05-21 ENCOUNTER — Ambulatory Visit: Payer: Medicare Other | Admitting: Cardiology

## 2021-05-21 VITALS — BP 100/50 | HR 72 | Ht 71.0 in | Wt 200.0 lb

## 2021-05-21 DIAGNOSIS — I5022 Chronic systolic (congestive) heart failure: Secondary | ICD-10-CM

## 2021-05-21 DIAGNOSIS — I4891 Unspecified atrial fibrillation: Secondary | ICD-10-CM | POA: Diagnosis not present

## 2021-05-21 DIAGNOSIS — I2510 Atherosclerotic heart disease of native coronary artery without angina pectoris: Secondary | ICD-10-CM | POA: Diagnosis not present

## 2021-05-21 DIAGNOSIS — I441 Atrioventricular block, second degree: Secondary | ICD-10-CM | POA: Diagnosis not present

## 2021-05-21 LAB — CUP PACEART REMOTE DEVICE CHECK
Battery Remaining Longevity: 98 mo
Battery Remaining Percentage: 79 %
Battery Voltage: 3.01 V
Brady Statistic AP VP Percent: 38 %
Brady Statistic AP VS Percent: 1 %
Brady Statistic AS VP Percent: 58 %
Brady Statistic AS VS Percent: 1.1 %
Brady Statistic RA Percent Paced: 35 %
Brady Statistic RV Percent Paced: 96 %
Date Time Interrogation Session: 20220927020013
Implantable Lead Implant Date: 20200630
Implantable Lead Implant Date: 20200630
Implantable Lead Location: 753859
Implantable Lead Location: 753860
Implantable Pulse Generator Implant Date: 20200630
Lead Channel Impedance Value: 540 Ohm
Lead Channel Impedance Value: 790 Ohm
Lead Channel Pacing Threshold Amplitude: 0.625 V
Lead Channel Pacing Threshold Amplitude: 1 V
Lead Channel Pacing Threshold Pulse Width: 0.5 ms
Lead Channel Pacing Threshold Pulse Width: 0.5 ms
Lead Channel Sensing Intrinsic Amplitude: 1.2 mV
Lead Channel Sensing Intrinsic Amplitude: 11.3 mV
Lead Channel Setting Pacing Amplitude: 0.875
Lead Channel Setting Pacing Amplitude: 2 V
Lead Channel Setting Pacing Pulse Width: 0.5 ms
Lead Channel Setting Sensing Sensitivity: 4 mV
Pulse Gen Model: 2272
Pulse Gen Serial Number: 3311958

## 2021-05-21 NOTE — Patient Instructions (Addendum)
Medication Instructions:  Continue all current medications.   Labwork: none  Testing/Procedures: none  Follow-Up: 6 months   Any Other Special Instructions Will Be Listed Below (If Applicable).   If you need a refill on your cardiac medications before your next appointment, please call your pharmacy.  
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
## 2021-05-28 NOTE — Progress Notes (Signed)
Remote pacemaker transmission.   

## 2021-06-03 ENCOUNTER — Telehealth: Payer: Self-pay | Admitting: Cardiology

## 2021-06-03 NOTE — Telephone Encounter (Signed)
Left message for pt to call office back

## 2021-06-03 NOTE — Telephone Encounter (Signed)
Pt/Pt's wife notified to call Urology for refill of urological medications. Both verbalized understanding.

## 2021-06-03 NOTE — Telephone Encounter (Signed)
*  STAT* If patient is at the pharmacy, call can be transferred to refill team.   1. Which medications need to be refilled? (please list name of each medication and dose if known)   tamsulosin (FLOMAX) 0.4 MG CAPS capsule [[id number]]   2. Which pharmacy/location (including street and city if local pharmacy) is medication to be sent to?  [facility]   3. Do they need a 30 day or 90 day supply?   [place]

## 2021-06-04 ENCOUNTER — Encounter: Payer: Self-pay | Admitting: Nurse Practitioner

## 2021-06-04 ENCOUNTER — Ambulatory Visit (INDEPENDENT_AMBULATORY_CARE_PROVIDER_SITE_OTHER): Payer: Medicare Other | Admitting: Nurse Practitioner

## 2021-06-04 ENCOUNTER — Other Ambulatory Visit: Payer: Self-pay

## 2021-06-04 ENCOUNTER — Other Ambulatory Visit: Payer: Self-pay | Admitting: Family Medicine

## 2021-06-04 VITALS — BP 99/51 | HR 61 | Temp 98.6°F | Ht 71.0 in | Wt 200.0 lb

## 2021-06-04 DIAGNOSIS — R3989 Other symptoms and signs involving the genitourinary system: Secondary | ICD-10-CM | POA: Diagnosis not present

## 2021-06-04 DIAGNOSIS — R300 Dysuria: Secondary | ICD-10-CM | POA: Diagnosis not present

## 2021-06-04 LAB — URINALYSIS, ROUTINE W REFLEX MICROSCOPIC
Bilirubin, UA: NEGATIVE
Nitrite, UA: NEGATIVE
Specific Gravity, UA: 1.015 (ref 1.005–1.030)
Urobilinogen, Ur: >8 mg/dL — ABNORMAL HIGH (ref 0.2–1.0)
pH, UA: 5 (ref 5.0–7.5)

## 2021-06-04 LAB — MICROSCOPIC EXAMINATION
Epithelial Cells (non renal): NONE SEEN /hpf (ref 0–10)
RBC, Urine: >30 /hpf — AB (ref 0–2)
Renal Epithel, UA: NONE SEEN /hpf

## 2021-06-04 MED ORDER — CEPHALEXIN 500 MG PO CAPS: 500.0000 mg | ORAL_CAPSULE | Freq: Two times a day (BID) | ORAL | 0 refills | Status: DC

## 2021-06-04 NOTE — Assessment & Plan Note (Signed)
Urinalysis positive for UTI, started patient on keflex 500 mg tablet by mouth twice daily, urine cultures completed results pending.  Education provided with printed hand outs given. Encouraged patient to increase hydration.   Follow up with worsening unresolved symptoms
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
## 2021-06-04 NOTE — Patient Instructions (Signed)
Dysuria ?Dysuria is pain or discomfort during urination. The pain or discomfort may be felt in the part of the body that drains urine from the bladder (urethra) or in the surrounding tissue of the genitals. The pain may also be felt in the groin area, lower abdomen, or lower back. ?You may have to urinate frequently or have the sudden feeling that you have to urinate (urgency). Dysuria can affect anyone, but it is more common in females. Dysuria can be caused by many different things, including: ?Urinary tract infection. ?Kidney stones or bladder stones. ?Certain STIs (sexually transmitted infections), such as chlamydia. ?Dehydration. ?Inflammation of the tissues of the vagina. ?Use of certain medicines. ?Use of certain soaps or scented products that cause irritation. ?Follow these instructions at home: ?Medicines ?Take over-the-counter and prescription medicines only as told by your health care provider. ?If you were prescribed an antibiotic medicine, take it as told by your health care provider. Do not stop taking the antibiotic even if you start to feel better. ?Eating and drinking ? ?Drink enough fluid to keep your urine pale yellow. ?Avoid caffeinated beverages, tea, and alcohol. These beverages can irritate the bladder and make dysuria worse. In males, alcohol may irritate the prostate. ?General instructions ?Watch your condition for any changes. ?Urinate often. Avoid holding urine for long periods of time. ?If you are male, you should wipe from front to back after urinating or having a bowel movement. Use each piece of toilet paper only once. ?Empty your bladder after sex. ?Keep all follow-up visits. This is important. ?If you had any tests done to find the cause of dysuria, it is up to you to get your test results. Ask your health care provider, or the department that is doing the test, when your results will be ready. ?Contact a health care provider if: ?You have a fever. ?You develop pain in your back or  sides. ?You have nausea or vomiting. ?You have blood in your urine. ?You are not urinating as often as you usually do. ?Get help right away if: ?Your pain is severe and not relieved with medicines. ?You cannot eat or drink without vomiting. ?You are confused. ?You have a rapid heartbeat while resting. ?You have shaking or chills. ?You feel extremely weak. ?Summary ?Dysuria is pain or discomfort while urinating. Many different conditions can lead to dysuria. ?If you have dysuria, you may have to urinate frequently or have the sudden feeling that you have to urinate (urgency). ?Watch your condition for any changes. Keep all follow-up visits. ?Make sure that you urinate often and drink enough fluid to keep your urine pale yellow. ?This information is not intended to replace advice given to you by your health care provider. Make sure you discuss any questions you have with your health care provider. ?Document Revised: 03/23/2020 Document Reviewed: 03/23/2020 ?Elsevier Patient Education ? 2022 [employer] ? ?

## 2021-06-04 NOTE — Assessment & Plan Note (Signed)
urinalysis completed results pending.

## 2021-06-11 ENCOUNTER — Encounter: Payer: Self-pay | Admitting: Family Medicine

## 2021-06-11 ENCOUNTER — Other Ambulatory Visit: Payer: Self-pay

## 2021-06-11 ENCOUNTER — Ambulatory Visit (INDEPENDENT_AMBULATORY_CARE_PROVIDER_SITE_OTHER): Payer: Medicare Other | Admitting: Family Medicine

## 2021-06-11 VITALS — BP 124/69 | HR 60 | Temp 97.3°F | Ht 71.0 in | Wt 198.6 lb

## 2021-06-11 DIAGNOSIS — E1159 Type 2 diabetes mellitus with other circulatory complications: Secondary | ICD-10-CM

## 2021-06-11 DIAGNOSIS — I152 Hypertension secondary to endocrine disorders: Secondary | ICD-10-CM

## 2021-06-11 DIAGNOSIS — E1142 Type 2 diabetes mellitus with diabetic polyneuropathy: Secondary | ICD-10-CM | POA: Diagnosis not present

## 2021-06-11 DIAGNOSIS — E1169 Type 2 diabetes mellitus with other specified complication: Secondary | ICD-10-CM | POA: Diagnosis not present

## 2021-06-11 DIAGNOSIS — I110 Hypertensive heart disease with heart failure: Secondary | ICD-10-CM | POA: Diagnosis not present

## 2021-06-11 DIAGNOSIS — E785 Hyperlipidemia, unspecified: Secondary | ICD-10-CM | POA: Diagnosis not present

## 2021-06-11 DIAGNOSIS — F418 Other specified anxiety disorders: Secondary | ICD-10-CM

## 2021-06-11 LAB — BAYER DCA HB A1C WAIVED: HB A1C (BAYER DCA - WAIVED): 5.9 % — ABNORMAL HIGH (ref 4.8–5.6)
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
## 2021-06-13 LAB — CULTURE, URINE COMPREHENSIVE

## 2021-06-14 ENCOUNTER — Other Ambulatory Visit: Payer: Self-pay | Admitting: Nurse Practitioner

## 2021-06-14 DIAGNOSIS — R8279 Other abnormal findings on microbiological examination of urine: Secondary | ICD-10-CM

## 2021-06-14 DIAGNOSIS — N390 Urinary tract infection, site not specified: Secondary | ICD-10-CM | POA: Insufficient documentation

## 2021-06-14 MED ORDER — SULFAMETHOXAZOLE-TRIMETHOPRIM 800-160 MG PO TABS: 1.0000 | ORAL_TABLET | Freq: Two times a day (BID) | ORAL | 0 refills | Status: DC

## 2021-06-22 DIAGNOSIS — R319 Hematuria, unspecified: Secondary | ICD-10-CM | POA: Diagnosis not present

## 2021-06-23 ENCOUNTER — Inpatient Hospital Stay (HOSPITAL_BASED_OUTPATIENT_CLINIC_OR_DEPARTMENT_OTHER)
Admission: EM | Admit: 2021-06-23 | Discharge: 2021-06-27 | DRG: 694 | Disposition: A | Discharge status: Home / Self Care | Payer: Medicare Other | Attending: Internal Medicine | Admitting: Internal Medicine

## 2021-06-23 ENCOUNTER — Encounter (HOSPITAL_BASED_OUTPATIENT_CLINIC_OR_DEPARTMENT_OTHER): Payer: Self-pay | Admitting: Obstetrics and Gynecology

## 2021-06-23 ENCOUNTER — Emergency Department (HOSPITAL_BASED_OUTPATIENT_CLINIC_OR_DEPARTMENT_OTHER): Payer: Medicare Other

## 2021-06-23 ENCOUNTER — Other Ambulatory Visit: Payer: Self-pay

## 2021-06-23 DIAGNOSIS — Z96651 Presence of right artificial knee joint: Secondary | ICD-10-CM | POA: Diagnosis present

## 2021-06-23 DIAGNOSIS — E1169 Type 2 diabetes mellitus with other specified complication: Secondary | ICD-10-CM | POA: Diagnosis present

## 2021-06-23 DIAGNOSIS — Z8042 Family history of malignant neoplasm of prostate: Secondary | ICD-10-CM

## 2021-06-23 DIAGNOSIS — K219 Gastro-esophageal reflux disease without esophagitis: Secondary | ICD-10-CM | POA: Diagnosis not present

## 2021-06-23 DIAGNOSIS — R310 Gross hematuria: Secondary | ICD-10-CM | POA: Diagnosis present

## 2021-06-23 DIAGNOSIS — E119 Type 2 diabetes mellitus without complications: Secondary | ICD-10-CM

## 2021-06-23 DIAGNOSIS — Z20822 Contact with and (suspected) exposure to covid-19: Secondary | ICD-10-CM | POA: Diagnosis not present

## 2021-06-23 DIAGNOSIS — Z950 Presence of cardiac pacemaker: Secondary | ICD-10-CM

## 2021-06-23 DIAGNOSIS — I441 Atrioventricular block, second degree: Secondary | ICD-10-CM | POA: Diagnosis present

## 2021-06-23 DIAGNOSIS — N179 Acute kidney failure, unspecified: Secondary | ICD-10-CM | POA: Diagnosis not present

## 2021-06-23 DIAGNOSIS — Z79899 Other long term (current) drug therapy: Secondary | ICD-10-CM

## 2021-06-23 DIAGNOSIS — Z7951 Long term (current) use of inhaled steroids: Secondary | ICD-10-CM

## 2021-06-23 DIAGNOSIS — I5020 Unspecified systolic (congestive) heart failure: Secondary | ICD-10-CM | POA: Diagnosis present

## 2021-06-23 DIAGNOSIS — N200 Calculus of kidney: Secondary | ICD-10-CM

## 2021-06-23 DIAGNOSIS — Z801 Family history of malignant neoplasm of trachea, bronchus and lung: Secondary | ICD-10-CM

## 2021-06-23 DIAGNOSIS — Z87442 Personal history of urinary calculi: Secondary | ICD-10-CM

## 2021-06-23 DIAGNOSIS — D696 Thrombocytopenia, unspecified: Secondary | ICD-10-CM | POA: Diagnosis present

## 2021-06-23 DIAGNOSIS — Z8249 Family history of ischemic heart disease and other diseases of the circulatory system: Secondary | ICD-10-CM

## 2021-06-23 DIAGNOSIS — D631 Anemia in chronic kidney disease: Secondary | ICD-10-CM | POA: Diagnosis not present

## 2021-06-23 DIAGNOSIS — Z8673 Personal history of transient ischemic attack (TIA), and cerebral infarction without residual deficits: Secondary | ICD-10-CM

## 2021-06-23 DIAGNOSIS — Z951 Presence of aortocoronary bypass graft: Secondary | ICD-10-CM

## 2021-06-23 DIAGNOSIS — Z888 Allergy status to other drugs, medicaments and biological substances status: Secondary | ICD-10-CM

## 2021-06-23 DIAGNOSIS — E1122 Type 2 diabetes mellitus with diabetic chronic kidney disease: Secondary | ICD-10-CM | POA: Diagnosis not present

## 2021-06-23 DIAGNOSIS — Z923 Personal history of irradiation: Secondary | ICD-10-CM

## 2021-06-23 DIAGNOSIS — J449 Chronic obstructive pulmonary disease, unspecified: Secondary | ICD-10-CM | POA: Diagnosis not present

## 2021-06-23 DIAGNOSIS — Z885 Allergy status to narcotic agent status: Secondary | ICD-10-CM

## 2021-06-23 DIAGNOSIS — E785 Hyperlipidemia, unspecified: Secondary | ICD-10-CM | POA: Diagnosis present

## 2021-06-23 DIAGNOSIS — N281 Cyst of kidney, acquired: Secondary | ICD-10-CM | POA: Diagnosis present

## 2021-06-23 DIAGNOSIS — Z87440 Personal history of urinary (tract) infections: Secondary | ICD-10-CM

## 2021-06-23 DIAGNOSIS — G4733 Obstructive sleep apnea (adult) (pediatric): Secondary | ICD-10-CM | POA: Diagnosis not present

## 2021-06-23 DIAGNOSIS — Z87891 Personal history of nicotine dependence: Secondary | ICD-10-CM

## 2021-06-23 DIAGNOSIS — Z85828 Personal history of other malignant neoplasm of skin: Secondary | ICD-10-CM

## 2021-06-23 DIAGNOSIS — N201 Calculus of ureter: Secondary | ICD-10-CM | POA: Diagnosis not present

## 2021-06-23 DIAGNOSIS — Z7901 Long term (current) use of anticoagulants: Secondary | ICD-10-CM

## 2021-06-23 DIAGNOSIS — Z22338 Carrier of other streptococcus: Secondary | ICD-10-CM | POA: Diagnosis not present

## 2021-06-23 DIAGNOSIS — N202 Calculus of kidney with calculus of ureter: Principal | ICD-10-CM | POA: Diagnosis present

## 2021-06-23 DIAGNOSIS — H9190 Unspecified hearing loss, unspecified ear: Secondary | ICD-10-CM | POA: Diagnosis present

## 2021-06-23 DIAGNOSIS — N1831 Chronic kidney disease, stage 3a: Secondary | ICD-10-CM | POA: Diagnosis present

## 2021-06-23 DIAGNOSIS — N390 Urinary tract infection, site not specified: Secondary | ICD-10-CM | POA: Diagnosis present

## 2021-06-23 DIAGNOSIS — N3020 Other chronic cystitis without hematuria: Secondary | ICD-10-CM | POA: Diagnosis not present

## 2021-06-23 DIAGNOSIS — I2510 Atherosclerotic heart disease of native coronary artery without angina pectoris: Secondary | ICD-10-CM | POA: Diagnosis present

## 2021-06-23 DIAGNOSIS — I480 Paroxysmal atrial fibrillation: Secondary | ICD-10-CM | POA: Diagnosis present

## 2021-06-23 DIAGNOSIS — I959 Hypotension, unspecified: Secondary | ICD-10-CM | POA: Diagnosis not present

## 2021-06-23 DIAGNOSIS — L304 Erythema intertrigo: Secondary | ICD-10-CM | POA: Diagnosis present

## 2021-06-23 DIAGNOSIS — I5022 Chronic systolic (congestive) heart failure: Secondary | ICD-10-CM | POA: Diagnosis present

## 2021-06-23 DIAGNOSIS — I130 Hypertensive heart and chronic kidney disease with heart failure and stage 1 through stage 4 chronic kidney disease, or unspecified chronic kidney disease: Secondary | ICD-10-CM | POA: Diagnosis not present

## 2021-06-23 DIAGNOSIS — Z9049 Acquired absence of other specified parts of digestive tract: Secondary | ICD-10-CM

## 2021-06-23 DIAGNOSIS — Z880 Allergy status to penicillin: Secondary | ICD-10-CM

## 2021-06-23 DIAGNOSIS — Z8546 Personal history of malignant neoplasm of prostate: Secondary | ICD-10-CM | POA: Diagnosis not present

## 2021-06-23 DIAGNOSIS — E781 Pure hyperglyceridemia: Secondary | ICD-10-CM | POA: Diagnosis present

## 2021-06-23 DIAGNOSIS — E1142 Type 2 diabetes mellitus with diabetic polyneuropathy: Secondary | ICD-10-CM | POA: Diagnosis not present

## 2021-06-23 DIAGNOSIS — Z7984 Long term (current) use of oral hypoglycemic drugs: Secondary | ICD-10-CM

## 2021-06-23 DIAGNOSIS — Z825 Family history of asthma and other chronic lower respiratory diseases: Secondary | ICD-10-CM

## 2021-06-23 DIAGNOSIS — Z8679 Personal history of other diseases of the circulatory system: Secondary | ICD-10-CM

## 2021-06-23 DIAGNOSIS — Z800 Family history of malignant neoplasm of digestive organs: Secondary | ICD-10-CM

## 2021-06-23 LAB — COMPREHENSIVE METABOLIC PANEL
ALT: 15 U/L (ref 0–44)
AST: 24 U/L (ref 15–41)
Albumin: 3.9 g/dL (ref 3.5–5.0)
Alkaline Phosphatase: 63 U/L (ref 38–126)
Anion gap: 9 (ref 5–15)
BUN: 17 mg/dL (ref 8–23)
CO2: 24 mmol/L (ref 22–32)
Calcium: 9.6 mg/dL (ref 8.9–10.3)
Chloride: 105 mmol/L (ref 98–111)
Creatinine, Ser: 1.61 mg/dL — ABNORMAL HIGH (ref 0.61–1.24)
GFR, Estimated: 41 mL/min — ABNORMAL LOW (ref >60)
Glucose, Bld: 93 mg/dL (ref 70–99)
Potassium: 4.5 mmol/L (ref 3.5–5.1)
Sodium: 138 mmol/L (ref 135–145)
Total Bilirubin: 0.8 mg/dL (ref 0.3–1.2)
Total Protein: 6.8 g/dL (ref 6.5–8.1)

## 2021-06-23 LAB — CBC WITH DIFFERENTIAL/PLATELET
Abs Immature Granulocytes: 0.04 10*3/uL (ref 0.00–0.07)
Basophils Absolute: 0 10*3/uL (ref 0.0–0.1)
Basophils Relative: 1 %
Eosinophils Absolute: 0 10*3/uL (ref 0.0–0.5)
Eosinophils Relative: 0 %
HCT: 39.6 % (ref 39.0–52.0)
Hemoglobin: 13 g/dL (ref 13.0–17.0)
Immature Granulocytes: 1 %
Lymphocytes Relative: 29 %
Lymphs Abs: 1.6 10*3/uL (ref 0.7–4.0)
MCH: 32.6 pg (ref 26.0–34.0)
MCHC: 32.8 g/dL (ref 30.0–36.0)
MCV: 99.2 fL (ref 80.0–100.0)
Monocytes Absolute: 0.5 10*3/uL (ref 0.1–1.0)
Monocytes Relative: 10 %
Neutro Abs: 3.2 10*3/uL (ref 1.7–7.7)
Neutrophils Relative %: 59 %
Platelets: 81 10*3/uL — ABNORMAL LOW (ref 150–400)
RBC: 3.99 MIL/uL — ABNORMAL LOW (ref 4.22–5.81)
RDW: 15 % (ref 11.5–15.5)
Smear Review: DECREASED
WBC: 5.4 10*3/uL (ref 4.0–10.5)
nRBC: 0 % (ref 0.0–0.2)

## 2021-06-23 LAB — URINALYSIS, ROUTINE W REFLEX MICROSCOPIC
Bilirubin Urine: NEGATIVE
Glucose, UA: NEGATIVE mg/dL
Ketones, ur: NEGATIVE mg/dL
Nitrite: NEGATIVE
Protein, ur: 100 mg/dL — AB
Specific Gravity, Urine: 1.019 (ref 1.005–1.030)
pH: 7.5 (ref 5.0–8.0)

## 2021-06-23 LAB — RESP PANEL BY RT-PCR (FLU A&B, COVID) ARPGX2
Influenza A by PCR: NEGATIVE
Influenza B by PCR: NEGATIVE
SARS Coronavirus 2 by RT PCR: NEGATIVE

## 2021-06-23 MED ORDER — ACETAMINOPHEN 325 MG PO TABS
650.0000 mg | ORAL_TABLET | Freq: Four times a day (QID) | ORAL | Status: DC | PRN
  Filled 2021-06-23: qty 2

## 2021-06-23 MED ORDER — ALBUTEROL SULFATE HFA 108 (90 BASE) MCG/ACT IN AERS: 2.0000 | INHALATION_SPRAY | Freq: Four times a day (QID) | RESPIRATORY_TRACT | Status: DC | PRN

## 2021-06-23 MED ORDER — SENNOSIDES-DOCUSATE SODIUM 8.6-50 MG PO TABS: 1.0000 | ORAL_TABLET | Freq: Every evening | ORAL | Status: DC | PRN

## 2021-06-23 MED ORDER — ALBUTEROL SULFATE (2.5 MG/3ML) 0.083% IN NEBU: 2.5000 mg | INHALATION_SOLUTION | Freq: Four times a day (QID) | RESPIRATORY_TRACT | Status: DC | PRN

## 2021-06-23 MED ORDER — ROSUVASTATIN CALCIUM 20 MG PO TABS
40.0000 mg | ORAL_TABLET | Freq: Every day | ORAL | Status: DC
  Administered 2021-06-24 – 2021-06-27 (×4): 40 mg via ORAL
  Filled 2021-06-23 (×4): qty 2

## 2021-06-23 MED ORDER — ACETAMINOPHEN 650 MG RE SUPP: 650.0000 mg | Freq: Four times a day (QID) | RECTAL | Status: DC | PRN

## 2021-06-23 MED ORDER — OXYCODONE HCL 5 MG PO TABS
5.0000 mg | ORAL_TABLET | ORAL | Status: DC | PRN
  Administered 2021-06-23 – 2021-06-25 (×2): 5 mg via ORAL
  Filled 2021-06-23 (×2): qty 1

## 2021-06-23 MED ORDER — SULFAMETHOXAZOLE-TRIMETHOPRIM 800-160 MG PO TABS: 1.0000 | ORAL_TABLET | Freq: Once | ORAL | Status: DC

## 2021-06-23 MED ORDER — SODIUM CHLORIDE 0.9 % IV SOLN
1.0000 g | Freq: Two times a day (BID) | INTRAVENOUS | Status: DC
  Administered 2021-06-24 – 2021-06-25 (×3): 1 g via INTRAVENOUS
  Filled 2021-06-23 (×4): qty 1

## 2021-06-23 MED ORDER — MOMETASONE FURO-FORMOTEROL FUM 200-5 MCG/ACT IN AERO
2.0000 | INHALATION_SPRAY | Freq: Two times a day (BID) | RESPIRATORY_TRACT | Status: DC
  Administered 2021-06-24 – 2021-06-27 (×7): 2 via RESPIRATORY_TRACT
  Filled 2021-06-23: qty 8.8

## 2021-06-23 MED ORDER — METOPROLOL SUCCINATE ER 25 MG PO TB24
12.5000 mg | ORAL_TABLET | Freq: Every day | ORAL | Status: DC
  Filled 2021-06-23: qty 1

## 2021-06-23 MED ORDER — AMIODARONE HCL 200 MG PO TABS
200.0000 mg | ORAL_TABLET | Freq: Every day | ORAL | Status: DC
  Administered 2021-06-24 – 2021-06-27 (×4): 200 mg via ORAL
  Filled 2021-06-23 (×4): qty 1

## 2021-06-23 MED ORDER — ONDANSETRON HCL 4 MG/2ML IJ SOLN
4.0000 mg | Freq: Four times a day (QID) | INTRAMUSCULAR | Status: DC | PRN
  Filled 2021-06-23: qty 2

## 2021-06-23 MED ORDER — SODIUM CHLORIDE 0.9% FLUSH
3.0000 mL | Freq: Two times a day (BID) | INTRAVENOUS | Status: DC
  Administered 2021-06-23 – 2021-06-26 (×7): 3 mL via INTRAVENOUS

## 2021-06-23 MED ORDER — APIXABAN 5 MG PO TABS: 5.0000 mg | ORAL_TABLET | Freq: Two times a day (BID) | ORAL | Status: DC

## 2021-06-23 MED ORDER — HYDROCODONE-ACETAMINOPHEN 5-325 MG PO TABS
1.0000 | ORAL_TABLET | Freq: Once | ORAL | Status: AC
  Administered 2021-06-23: 1 via ORAL
  Filled 2021-06-23: qty 1

## 2021-06-23 MED ORDER — ONDANSETRON HCL 4 MG PO TABS: 4.0000 mg | ORAL_TABLET | Freq: Four times a day (QID) | ORAL | Status: DC | PRN

## 2021-06-23 MED ORDER — KETOCONAZOLE 2 % EX CREA
TOPICAL_CREAM | Freq: Every day | CUTANEOUS | Status: DC
  Filled 2021-06-23: qty 15

## 2021-06-23 MED ORDER — ACETAMINOPHEN 325 MG PO TABS: 650.0000 mg | ORAL_TABLET | Freq: Once | ORAL | Status: DC

## 2021-06-23 MED ORDER — SODIUM CHLORIDE 0.9 % IV SOLN
1000.0000 mg | Freq: Once | INTRAVENOUS | Status: AC
  Administered 2021-06-23: 1000 mg via INTRAVENOUS

## 2021-06-23 NOTE — Progress Notes (Signed)
Pharmacy Antibiotic Note  [name] is a 87 y.o. male admitted on 06/23/2021 with recent MDRO [place] UTI and failed PO cephalexin and PO bactrim outpatient. Now coming in with slightly AKI - Cr 1.6, up from bsl of 1.1. Pharmacy has been consulted for meropenem dosing.  Plan: Meropenem 1g IV q12h -Monitor renal function, clinical status, and antibiotic plan  Weight: 87.2 kg (192 lb 5.6 oz)  Temp (24hrs), Avg:97.7 F (36.5 C), Min:97.7 F (36.5 C), Max:97.7 F (36.5 C)  Recent Labs  Lab 06/23/21 1134  WBC 5.4  CREATININE 1.61*    Estimated Creatinine Clearance: 35.1 mL/min (A) (by C-G formula based on SCr of 1.61 mg/dL (H)).    Allergies  Allergen Reactions   Tramadol Other (See Comments)    Dizzy   Ketorolac Tromethamine Rash    Antimicrobials this admission: Meropenem 10/30 >>   10/30 UCx:   Thank you for allowing pharmacy to be a part of this patient's care.  Joetta Manners, PharmD, [facility] Emergency Medicine Clinical Pharmacist ED RPh Phone: [place]: [phone]

## 2021-06-23 NOTE — Discharge Instructions (Signed)
Please follow up with urology in the next few days.

## 2021-06-23 NOTE — Progress Notes (Signed)
Patient arrived to 1610 from [place]. [facility] reported hematuria, MD made aware. Patient is alert and oriented, not in distress, placed on tele, Posey Pronto was assigned to this patient and is aware he is here.

## 2021-06-23 NOTE — ED Triage Notes (Signed)
Patient reports to the ER for flank pain, states he was seen recently and given medication for a UTI. Patient reports he was told he needed a CT scan but they did not have a CT scanner.
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
## 2021-06-24 DIAGNOSIS — N179 Acute kidney failure, unspecified: Secondary | ICD-10-CM

## 2021-06-24 LAB — CBC
HCT: 36 % — ABNORMAL LOW (ref 39.0–52.0)
Hemoglobin: 11.9 g/dL — ABNORMAL LOW (ref 13.0–17.0)
MCH: 33.3 pg (ref 26.0–34.0)
MCHC: 33.1 g/dL (ref 30.0–36.0)
MCV: 100.8 fL — ABNORMAL HIGH (ref 80.0–100.0)
Platelets: 77 10*3/uL — ABNORMAL LOW (ref 150–400)
RBC: 3.57 MIL/uL — ABNORMAL LOW (ref 4.22–5.81)
RDW: 14.9 % (ref 11.5–15.5)
WBC: 4.8 10*3/uL (ref 4.0–10.5)
nRBC: 0 % (ref 0.0–0.2)

## 2021-06-24 LAB — BASIC METABOLIC PANEL
Anion gap: 4 — ABNORMAL LOW (ref 5–15)
BUN: 21 mg/dL (ref 8–23)
CO2: 24 mmol/L (ref 22–32)
Calcium: 8.9 mg/dL (ref 8.9–10.3)
Chloride: 107 mmol/L (ref 98–111)
Creatinine, Ser: 1.48 mg/dL — ABNORMAL HIGH (ref 0.61–1.24)
GFR, Estimated: 46 mL/min — ABNORMAL LOW (ref >60)
Glucose, Bld: 116 mg/dL — ABNORMAL HIGH (ref 70–99)
Potassium: 4.3 mmol/L (ref 3.5–5.1)
Sodium: 135 mmol/L (ref 135–145)

## 2021-06-24 LAB — GLUCOSE, CAPILLARY
Glucose-Capillary: 174 mg/dL — ABNORMAL HIGH (ref 70–99)
Glucose-Capillary: 146 mg/dL — ABNORMAL HIGH (ref 70–99)

## 2021-06-24 MED ORDER — INSULIN ASPART 100 UNIT/ML IJ SOLN
0.0000 [IU] | Freq: Three times a day (TID) | INTRAMUSCULAR | Status: DC
  Administered 2021-06-24 – 2021-06-25 (×2): 2 [IU] via SUBCUTANEOUS
  Administered 2021-06-25 – 2021-06-27 (×4): 1 [IU] via SUBCUTANEOUS
  Administered 2021-06-27: 2 [IU] via SUBCUTANEOUS

## 2021-06-24 MED ORDER — SODIUM CHLORIDE 0.9 % IV BOLUS
500.0000 mL | Freq: Once | INTRAVENOUS | Status: AC
  Administered 2021-06-24: 500 mL via INTRAVENOUS

## 2021-06-24 NOTE — Progress Notes (Signed)
Bed alarm going off and RN went into room to find pt pressing buttons on bed attempting to turn off alarm. Pt and daughter educated on importance of bed alarm and pt need for assistance when OOB. Both verbalized understanding. Will continue to monitor and inform oncoming RN.

## 2021-06-24 NOTE — Progress Notes (Signed)
PROGRESS NOTE    [name]   OEU:[id number]  DOB: 08/13/1933  DOA: 06/23/2021 PCP: [name], DO   Brief Narrative:  [name] is a 87 y.o. male with medical history significant for PAF on Eliquis, HFrEF (EF 35% by TTE 04/25/2020), CAD s/p CABG, Mobitz 2 second-degree AV block s/p PPM, COPD, T2DM, HTN, HLD, thrombocytopenia, recurrent UTIs, OSA not tolerating CPAP who presents to the ED for evaluation of right flank pain.  Patient recently diagnosed with multidrug-resistant [name] UTI by urine culture 06/04/2021.  He was initially started on oral Keflex which was switched to Bactrim when urine culture speciated.  Patient states symptoms improved after initial outpatient antibiotic treatment.  Yesterday he began to see blood in his urine and developed left flank pain.  He has not been having any subjective fevers, chills, diaphoresis, chest pain, dyspnea, nausea, vomiting, abdominal pain, dysuria.   He does take Eliquis regularly.  He denies any other obvious bleeding including epistaxis, hemoptysis, hematemesis, hematochezia, or melena.     Subjective: He had some vomiting this AM but was subsequently able to eat afterwards.    Assessment & Plan:   Principal Problem:   Complicated UTI (urinary tract infection)- nephrolithiasis - MDR Morganella on prior culture from 10/11 - b/l non obstructive calculi are present in the kidneys and in the ureters - awaiting culture from 1030 to result - cont Meropenem in the meantime  Active Problems:  Hematuria- anemia (acute on chronic) - gross hematuria continues today - Hb at baseline is 12-13 and is now 11.9 - follow - Hb is steadily dropping and is 11.9 today - cont to follow closely  Hypotension - BP 70/47 and then 70/35 in left arm - have ordered a fluid bolus of 500 cc  AKI- CKD 3a - baseline Cr 0.12 - CR on admission 1.61 - cont to hold diuretics and give IVF as needed     Paroxysmal atrial  fibrillation (HCC) - cont Elilquis for now while treating UTI  - holding Lopressor due to hypotension  - cont Amiodarone    S/P CABG x 3    Thrombocytopenia (HCC) - chronic issue- follow    Second degree Mobitz II AV block    Hyperlipidemia associated with type 2 diabetes mellitus (HCC) - Crestor  DM2 - holding Metformin - cont SSI     HFrEF (heart failure with reduced ejection fraction)  - holding Entresto, Furosemide, Spironolactone    AKI (acute kidney injury) ([place])   Time spent in minutes: 35 DVT prophylaxis:   Code Status: Full code Family Communication:  Level of Care: Level of care: Telemetry Disposition Plan:  Status is: Inpatient  Remains inpatient appropriate because: IV antibiotics and gross hematuria      Consultants:  none Procedures:  none Antimicrobials:  Anti-infectives (From admission, onward)    Start     Dose/Rate Route Frequency Ordered Stop   06/24/21 0230  meropenem (MERREM) 1 g in sodium chloride 0.9 % 100 mL IVPB        1 g 200 mL/hr over 30 Minutes Intravenous Every 12 hours 06/23/21 1348     06/23/21 1345  sulfamethoxazole-trimethoprim (BACTRIM DS) 800-160 MG per tablet 1 tablet  Status:  Discontinued        1 tablet Oral  Once 06/23/21 1331 06/23/21 1333   06/23/21 1345  meropenem (MERREM) 1,000 mg in sodium chloride 0.9 % 100 mL IVPB        1,000 mg 200  mL/hr over 30 Minutes Intravenous Once 06/23/21 1344 06/23/21 1445        Objective: Vitals:   06/24/21 0900 06/24/21 1056 06/24/21 1057 06/24/21 1057  BP:  (!) 70/47 (!) 70/35 104/69  Pulse:    73  Resp:      Temp:    98.1 F (36.7 C)  TempSrc:    Oral  SpO2: 93%     Weight:      Height:        Intake/Output Summary (Last 24 hours) at 06/24/2021 1347 Last data filed at 06/24/2021 0909 Gross per 24 hour  Intake 236.09 ml  Output 600 ml  Net -363.91 ml   Filed Weights   06/23/21 0943 06/23/21 2031  Weight: 87.2 kg 87.4 kg    Examination: General exam:  Appears comfortable  HEENT: PERRLA, oral mucosa moist, no sclera icterus or thrush Respiratory system: Clear to auscultation. Respiratory effort normal. Cardiovascular system: S1 & S2 heard, RRR.   Gastrointestinal system: Abdomen soft, non-tender,abdominal hernias noted. Normal bowel sounds. Central nervous system: Alert and oriented. No focal neurological deficits. Extremities: No cyanosis, clubbing or edema Skin: No rashes or ulcers Psychiatry:  Mood & affect appropriate.     Data Reviewed: I have personally reviewed following labs and imaging studies  CBC: Recent Labs  Lab 06/23/21 1134 06/24/21 0533  WBC 5.4 4.8  NEUTROABS 3.2  --   HGB 13.0 11.9*  HCT 39.6 36.0*  MCV 99.2 100.8*  PLT 81* 77*   Basic Metabolic Panel: Recent Labs  Lab 06/23/21 1134 06/24/21 0533  NA 138 135  K 4.5 4.3  CL 105 107  CO2 24 24  GLUCOSE 93 116*  BUN 17 21  CREATININE 1.61* 1.48*  CALCIUM 9.6 8.9   GFR: Estimated Creatinine Clearance: 38.2 mL/min (A) (by C-G formula based on SCr of 1.48 mg/dL (H)). Liver Function Tests: Recent Labs  Lab 06/23/21 1134  AST 24  ALT 15  ALKPHOS 63  BILITOT 0.8  PROT 6.8  ALBUMIN 3.9   No results for input(s): LIPASE, AMYLASE in the last 168 hours. No results for input(s): AMMONIA in the last 168 hours. Coagulation Profile: No results for input(s): INR, PROTIME in the last 168 hours. Cardiac Enzymes: No results for input(s): CKTOTAL, CKMB, CKMBINDEX, TROPONINI in the last 168 hours. BNP (last 3 results) No results for input(s): PROBNP in the last 8760 hours. HbA1C: No results for input(s): HGBA1C in the last 72 hours. CBG: No results for input(s): GLUCAP in the last 168 hours. Lipid Profile: No results for input(s): CHOL, HDL, LDLCALC, TRIG, CHOLHDL, LDLDIRECT in the last 72 hours. Thyroid Function Tests: No results for input(s): TSH, T4TOTAL, FREET4, T3FREE, THYROIDAB in the last 72 hours. Anemia Panel: No results for input(s):  VITAMINB12, FOLATE, FERRITIN, TIBC, IRON, RETICCTPCT in the last 72 hours. Urine analysis:    Component Value Date/Time   COLORURINE YELLOW 06/23/2021 0945   APPEARANCEUR CLEAR 06/23/2021 0945   APPEARANCEUR Cloudy (A) 06/04/2021 1501   LABSPEC 1.019 06/23/2021 0945   PHURINE 7.5 06/23/2021 0945   GLUCOSEU NEGATIVE 06/23/2021 0945   HGBUR SMALL (A) 06/23/2021 0945   BILIRUBINUR NEGATIVE 06/23/2021 0945   BILIRUBINUR Negative 06/04/2021 1501   KETONESUR NEGATIVE 06/23/2021 0945   PROTEINUR 100 (A) 06/23/2021 0945   UROBILINOGEN 1.0 03/28/2014 1208   NITRITE NEGATIVE 06/23/2021 0945   LEUKOCYTESUR TRACE (A) 06/23/2021 0945   Sepsis Labs: @LABRCNTIP (procalcitonin:4,lacticidven:4) ) Recent Results (from the past 240 hour(s))  Urine Culture  Status: None (Preliminary result)   Collection Time: 06/23/21  9:10 AM   Specimen: Urine, Clean Catch  Result Value Ref Range Status   Specimen Description   Final    URINE, CLEAN CATCH Performed at [facility], [address], [place], [place] [address]    Special Requests   Final    NONE Performed at [facility], [address], [place], [place] [address]    Culture   Final    CULTURE REINCUBATED FOR BETTER GROWTH Performed at [facility], [place] [address], [place], [place] [address]    Report Status PENDING  Incomplete  Resp Panel by RT-PCR (Flu A&B, Covid) Nasopharyngeal Swab     Status: None   Collection Time: 06/23/21  7:10 PM   Specimen: Nasopharyngeal Swab; Nasopharyngeal(NP) swabs in vial transport medium  Result Value Ref Range Status   SARS Coronavirus 2 by RT PCR NEGATIVE NEGATIVE Final    Comment: (NOTE) SARS-CoV-2 target nucleic acids are NOT DETECTED.  The SARS-CoV-2 RNA is generally detectable in upper respiratory specimens during the acute phase of infection. The lowest concentration of SARS-CoV-2 viral copies this assay can detect is 138 copies/mL. A negative result  does not preclude SARS-Cov-2 infection and should not be used as the sole basis for treatment or other patient management decisions. A negative result may occur with  improper specimen collection/handling, submission of specimen other than nasopharyngeal swab, presence of viral mutation(s) within the areas targeted by this assay, and inadequate number of viral copies(<138 copies/mL). A negative result must be combined with clinical observations, patient history, and epidemiological information. The expected result is Negative.  Fact Sheet for Patients:  EntrepreneurPulse.com.au  Fact Sheet for Healthcare Providers:  IncredibleEmployment.be  This test is no t yet approved or cleared by the [place] FDA and  has been authorized for detection and/or diagnosis of SARS-CoV-2 by FDA under an Emergency Use Authorization (EUA). This EUA will remain  in effect (meaning this test can be used) for the duration of the COVID-19 declaration under Section 564(b)(1) of the Act, 21 U.S.C.section 360bbb-3(b)(1), unless the authorization is terminated  or revoked sooner.       Influenza A by PCR NEGATIVE NEGATIVE Final   Influenza B by PCR NEGATIVE NEGATIVE Final    Comment: (NOTE) The Xpert Xpress SARS-CoV-2/FLU/RSV plus assay is intended as an aid in the diagnosis of influenza from Nasopharyngeal swab specimens and should not be used as a sole basis for treatment. Nasal washings and aspirates are unacceptable for Xpert Xpress SARS-CoV-2/FLU/RSV testing.  Fact Sheet for Patients: EntrepreneurPulse.com.au  Fact Sheet for Healthcare Providers: IncredibleEmployment.be  This test is not yet approved or cleared by the [place] FDA and has been authorized for detection and/or diagnosis of SARS-CoV-2 by FDA under an Emergency Use Authorization (EUA). This EUA will remain in effect (meaning this test can be used) for  the duration of the COVID-19 declaration under Section 564(b)(1) of the Act, 21 U.S.C. section 360bbb-3(b)(1), unless the authorization is terminated or revoked.  Performed at [facility], [place] [address], [place], [place] [address]          Radiology Studies: CT Renal Stone Study  Result Date: 06/23/2021 CLINICAL DATA:  Flank pain, hematuria. EXAM: CT ABDOMEN AND PELVIS WITHOUT CONTRAST TECHNIQUE: Multidetector CT imaging of the abdomen and pelvis was performed following the standard protocol without IV contrast. COMPARISON:  May 13, 2021. FINDINGS: Lower chest: No acute abnormality. Hepatobiliary: No focal liver  abnormality is seen. Status post cholecystectomy. No biliary dilatation. Pancreas: Unremarkable. No pancreatic ductal dilatation or surrounding inflammatory changes. Spleen: Normal in size without focal abnormality. Adrenals/Urinary Tract: Adrenal glands appear normal. Stable bilateral renal cysts are noted. Bilateral nonobstructive nephrolithiasis is noted. No hydronephrosis or renal obstruction is noted. Stable 1 cm calculus is seen in the right ureterovesical junction adjacent the urinary bladder which may represent chronic stone that is nonobstructive. Stomach/Bowel: Stomach is within normal limits. Appendix appears normal. No evidence of bowel wall thickening, distention, or inflammatory changes. Vascular/Lymphatic: Aortic atherosclerosis. No enlarged abdominal or pelvic lymph nodes. Reproductive: Prostate is unremarkable. Other: Small fat containing supraumbilical ventral hernia is noted. No ascites is noted. Musculoskeletal: No acute or significant osseous findings. IMPRESSION: Bilateral nonobstructive nephrolithiasis. No hydronephrosis or renal obstruction is noted. Stable possible nonobstructive calculus at the right ureterovesical junction. Aortic Atherosclerosis (ICD10-I70.0). Electronically Signed   By: Marijo Conception M.D.   On: 06/23/2021 10:44       Scheduled Meds:  amiodarone  200 mg Oral Daily   ketoconazole   Topical Daily   mometasone-formoterol  2 puff Inhalation BID   rosuvastatin  40 mg Oral Daily   sodium chloride flush  3 mL Intravenous Q12H   Continuous Infusions:  meropenem (MERREM) IV 1 g (06/24/21 0206)     LOS: 1 day      Debbe Odea, MD [facility] Pager: [email or web address] 06/24/2021, 1:47 PM

## 2021-06-25 LAB — BASIC METABOLIC PANEL
Anion gap: 7 (ref 5–15)
BUN: 17 mg/dL (ref 8–23)
CO2: 22 mmol/L (ref 22–32)
Calcium: 9.2 mg/dL (ref 8.9–10.3)
Chloride: 107 mmol/L (ref 98–111)
Creatinine, Ser: 1.07 mg/dL (ref 0.61–1.24)
GFR, Estimated: >60 mL/min (ref >60)
Glucose, Bld: 132 mg/dL — ABNORMAL HIGH (ref 70–99)
Potassium: 4.5 mmol/L (ref 3.5–5.1)
Sodium: 136 mmol/L (ref 135–145)

## 2021-06-25 LAB — GLUCOSE, CAPILLARY
Glucose-Capillary: 108 mg/dL — ABNORMAL HIGH (ref 70–99)
Glucose-Capillary: 195 mg/dL — ABNORMAL HIGH (ref 70–99)
Glucose-Capillary: 143 mg/dL — ABNORMAL HIGH (ref 70–99)
Glucose-Capillary: 144 mg/dL — ABNORMAL HIGH (ref 70–99)

## 2021-06-25 MED ORDER — CHLORHEXIDINE GLUCONATE CLOTH 2 % EX PADS
6.0000 | MEDICATED_PAD | Freq: Every day | CUTANEOUS | Status: DC
  Administered 2021-06-25 – 2021-06-27 (×3): 6 via TOPICAL

## 2021-06-25 MED ORDER — SODIUM CHLORIDE 0.9 % IV SOLN
1.0000 g | Freq: Three times a day (TID) | INTRAVENOUS | Status: DC
  Administered 2021-06-25 – 2021-06-26 (×3): 1 g via INTRAVENOUS
  Filled 2021-06-25 (×4): qty 1
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
## 2021-06-25 NOTE — Progress Notes (Signed)
Pharmacy Antibiotic Note  [name] is a 87 y.o. male admitted on 06/23/2021 with recent MDRO Morganella UTI and failed PO cephalexin and PO bactrim outpatient. Now coming in with slightly AKI - Cr 1.6, up from bsl of 1.1. Pharmacy was consulted for meropenem dosing.  Today, 06/25/2021 D#3 meropenem 1g IV q12h Remains afebrile, WBC wnl UCx results from 10/30 pending SCr improved, est CrCl > 50 mL/min  Plan:  Increase meropenem to 1g IV q8h Await results from UCx.   Height: 5\' 11"  (180.3 cm) Weight: 88.2 kg (194 lb 7.1 oz) IBW/kg (Calculated) : 75.3  Temp (24hrs), Avg:98.1 F (36.7 C), Min:97.8 F (36.6 C), Max:98.3 F (36.8 C)  Recent Labs  Lab 06/23/21 1134 06/24/21 0533 06/25/21 0504  WBC 5.4 4.8  --   CREATININE 1.61* 1.48* 1.07     Estimated Creatinine Clearance: 52.8 mL/min (by C-G formula based on SCr of 1.07 mg/dL).    Allergies  Allergen Reactions   Penicillins Other (See Comments)    Unknown   Tramadol Other (See Comments)    Dizzy   Ketorolac Tromethamine Rash    Antimicrobials this admission: Meropenem 10/30 >>   10/30 UCx: collected  Thank you for allowing pharmacy to be a part of this patient's care.  Clayburn Pert, PharmD, [place] [phone] 06/25/2021  6:53 AM

## 2021-06-25 NOTE — Progress Notes (Addendum)
PROGRESS NOTE    [name]   WLN:[id number]  DOB: [birth year]  DOA: 06/23/2021 PCP: [name], DO   Brief Narrative:  [name] is a 87 y.o. male with medical history significant for PAF on Eliquis, HFrEF (EF 35% by TTE 04/25/2020), CAD s/p CABG, Mobitz 2 second-degree AV block s/p PPM, COPD, T2DM, HTN, HLD, thrombocytopenia, recurrent UTIs, OSA not tolerating CPAP who presents to the ED for evaluation of right flank pain. He was being treated with Keflex for a UTI. This was changed to Bactrim and his urinary symptoms did not improve   Patient recently diagnosed with multidrug-resistant [name] UTI by urine culture 06/04/2021.     He presented to the ED on 10/30  when he began to see blood in his urine and developed left flank pain.  He has not been having any subjective fevers, chills, diaphoresis, chest pain, dyspnea, nausea, vomiting, abdominal pain, dysuria.   He does take Eliquis regularly.  He denies any other obvious bleeding including epistaxis, hemoptysis, hematemesis, hematochezia, or melena.     Subjective: He is still urinating blood.     Assessment & Plan:   Principal Problem:   Right Flank pain- Complicated UTI (urinary tract infection)- nephrolithiasis - MDR Morganella on prior culture from 10/11, he had e faecalis prior to this- - b/l non obstructive calculi are present in the kidneys and in the ureters - now growing e faecalis but only 20 K colonies- he is allergic to PCN and therefore unable to use Ampicillin- can cont Ertapenem for now (day 3) - I have consulted urology today as the multiple renal stones (noted on imaging) may be acting as a nidus and causing recurrent UTIs- urology will discuss a permanent treatment of his stones to hopefully prevent further infections  Active Problems:  Hematuria- anemia (acute on chronic) - gross hematuria continues today - likely secondary to right sided nephrolithiasis - Hb at baseline is  12-13 and is now 11.9 -   - cont to follow closely- Eliquis on hold for now  Hypotension - BP 70/47 and then 70/35 in left arm - improved after a fluid bolus of 500 cc  AKI- CKD 3a - baseline Cr 0.12 - CR on admission 1.61 - Cr improved to 1.07 today after holding diuretics and giving IVF - hold both IVF and diuretics and follow fluid status   HFrEF (heart failure with reduced ejection fraction)  - EF 35% on TTE on 04/25/21 - holding Entresto, Furosemide, Spironolactone - consider resumption tomorrow if Cr remains stable      Paroxysmal atrial fibrillation (HCC) - holding Elilquis for now while treating UTI  - holding Lopressor due to hypotension  - cont Amiodarone    CAD S/P CABG x 3    Thrombocytopenia (HCC) - chronic issue- follow    Second degree Mobitz II AV block - s/p pacer    Hyperlipidemia associated with type 2 diabetes mellitus (HCC) - Crestor  DM2 - holding Metformin - cont SSI  - last A1c 6.1  COPD - no exacerbation noted      Time spent in minutes: 35 DVT prophylaxis: Place and maintain sequential compression device Start: 06/24/21 1406  Code Status: Full code Family Communication:  Level of Care: Level of care: Telemetry Disposition Plan:  Status is: Inpatient  Remains inpatient appropriate because: IV antibiotics and gross hematuria      Consultants:  none Procedures:  none Antimicrobials:  Anti-infectives (From admission, onward)  Start     Dose/Rate Route Frequency Ordered Stop   06/25/21 1000  meropenem (MERREM) 1 g in sodium chloride 0.9 % 100 mL IVPB        1 g 200 mL/hr over 30 Minutes Intravenous Every 8 hours 06/25/21 0655     06/24/21 0230  meropenem (MERREM) 1 g in sodium chloride 0.9 % 100 mL IVPB  Status:  Discontinued        1 g 200 mL/hr over 30 Minutes Intravenous Every 12 hours 06/23/21 1348 06/25/21 0655   06/23/21 1345  sulfamethoxazole-trimethoprim (BACTRIM DS) 800-160 MG per tablet 1 tablet  Status:   Discontinued        1 tablet Oral  Once 06/23/21 1331 06/23/21 1333   06/23/21 1345  meropenem (MERREM) 1,000 mg in sodium chloride 0.9 % 100 mL IVPB        1,000 mg 200 mL/hr over 30 Minutes Intravenous Once 06/23/21 1344 06/23/21 1445        Objective: Vitals:   06/24/21 1934 06/24/21 2118 06/25/21 0431 06/25/21 0442  BP:  105/64  125/73  Pulse:  68  (!) 59  Resp:  14  18  Temp:  98.3 F (36.8 C)  98.1 F (36.7 C)  TempSrc:  Oral  Oral  SpO2: 91% 95%  96%  Weight:   88.2 kg   Height:        Intake/Output Summary (Last 24 hours) at 06/25/2021 1242 Last data filed at 06/25/2021 1217 Gross per 24 hour  Intake 240 ml  Output 925 ml  Net -685 ml    Filed Weights   06/23/21 0943 06/23/21 2031 06/25/21 0431  Weight: 87.2 kg 87.4 kg 88.2 kg    Examination: General exam: Appears comfortable  HEENT: PERRLA, oral mucosa moist, no sclera icterus or thrush Respiratory system: Clear to auscultation. Respiratory effort normal. Cardiovascular system: S1 & S2 heard, RRR.   Gastrointestinal system: Abdomen soft, non-tender,abdominal hernias noted. Normal bowel sounds. Central nervous system: Alert and oriented. No focal neurological deficits. Extremities: No cyanosis, clubbing or edema Skin: No rashes or ulcers Psychiatry:  Mood & affect appropriate.     Data Reviewed: I have personally reviewed following labs and imaging studies  CBC: Recent Labs  Lab 06/23/21 1134 06/24/21 0533  WBC 5.4 4.8  NEUTROABS 3.2  --   HGB 13.0 11.9*  HCT 39.6 36.0*  MCV 99.2 100.8*  PLT 81* 77*    Basic Metabolic Panel: Recent Labs  Lab 06/23/21 1134 06/24/21 0533 06/25/21 0504  NA 138 135 136  K 4.5 4.3 4.5  CL 105 107 107  CO2 24 24 22   GLUCOSE 93 116* 132*  BUN 17 21 17   CREATININE 1.61* 1.48* 1.07  CALCIUM 9.6 8.9 9.2    GFR: Estimated Creatinine Clearance: 52.8 mL/min (by C-G formula based on SCr of 1.07 mg/dL). Liver Function Tests: Recent Labs  Lab 06/23/21 1134   AST 24  ALT 15  ALKPHOS 63  BILITOT 0.8  PROT 6.8  ALBUMIN 3.9    No results for input(s): LIPASE, AMYLASE in the last 168 hours. No results for input(s): AMMONIA in the last 168 hours. Coagulation Profile: No results for input(s): INR, PROTIME in the last 168 hours. Cardiac Enzymes: No results for input(s): CKTOTAL, CKMB, CKMBINDEX, TROPONINI in the last 168 hours. BNP (last 3 results) No results for input(s): PROBNP in the last 8760 hours. HbA1C: No results for input(s): HGBA1C in the last 72 hours. CBG: Recent Labs  Lab 06/24/21 1802 06/24/21 2153 06/25/21 0725 06/25/21 1124  GLUCAP 174* 146* 108* 195*   Lipid Profile: No results for input(s): CHOL, HDL, LDLCALC, TRIG, CHOLHDL, LDLDIRECT in the last 72 hours. Thyroid Function Tests: No results for input(s): TSH, T4TOTAL, FREET4, T3FREE, THYROIDAB in the last 72 hours. Anemia Panel: No results for input(s): VITAMINB12, FOLATE, FERRITIN, TIBC, IRON, RETICCTPCT in the last 72 hours. Urine analysis:    Component Value Date/Time   COLORURINE YELLOW 06/23/2021 0945   APPEARANCEUR CLEAR 06/23/2021 0945   APPEARANCEUR Cloudy (A) 06/04/2021 1501   LABSPEC 1.019 06/23/2021 0945   PHURINE 7.5 06/23/2021 0945   GLUCOSEU NEGATIVE 06/23/2021 0945   HGBUR SMALL (A) 06/23/2021 0945   BILIRUBINUR NEGATIVE 06/23/2021 0945   BILIRUBINUR Negative 06/04/2021 1501   KETONESUR NEGATIVE 06/23/2021 0945   PROTEINUR 100 (A) 06/23/2021 0945   UROBILINOGEN 1.0 03/28/2014 1208   NITRITE NEGATIVE 06/23/2021 0945   LEUKOCYTESUR TRACE (A) 06/23/2021 0945   Sepsis Labs: @LABRCNTIP (procalcitonin:4,lacticidven:4) ) Recent Results (from the past 240 hour(s))  Urine Culture     Status: Abnormal (Preliminary result)   Collection Time: 06/23/21  9:10 AM   Specimen: Urine, Clean Catch  Result Value Ref Range Status   Specimen Description   Final    URINE, CLEAN CATCH Performed at [facility], [address]  [place], [place] [address]    Special Requests   Final    NONE Performed at [facility], [address] [place], [place] [address]    Culture (A)  Final    20,000 COLONIES/mL ENTEROCOCCUS FAECALIS SUSCEPTIBILITIES TO FOLLOW Performed at [facility], [place] [address], [place], [place] [address]    Report Status PENDING  Incomplete  Resp Panel by RT-PCR (Flu A&B, Covid) Nasopharyngeal Swab     Status: None   Collection Time: 06/23/21  7:10 PM   Specimen: Nasopharyngeal Swab; Nasopharyngeal(NP) swabs in vial transport medium  Result Value Ref Range Status   SARS Coronavirus 2 by RT PCR NEGATIVE NEGATIVE Final    Comment: (NOTE) SARS-CoV-2 target nucleic acids are NOT DETECTED.  The SARS-CoV-2 RNA is generally detectable in upper respiratory specimens during the acute phase of infection. The lowest concentration of SARS-CoV-2 viral copies this assay can detect is 138 copies/mL. A negative result does not preclude SARS-Cov-2 infection and should not be used as the sole basis for treatment or other patient management decisions. A negative result may occur with  improper specimen collection/handling, submission of specimen other than nasopharyngeal swab, presence of viral mutation(s) within the areas targeted by this assay, and inadequate number of viral copies(<138 copies/mL). A negative result must be combined with clinical observations, patient history, and epidemiological information. The expected result is Negative.  Fact Sheet for Patients:  EntrepreneurPulse.com.au  Fact Sheet for Healthcare Providers:  IncredibleEmployment.be  This test is no t yet approved or cleared by the [place] FDA and  has been authorized for detection and/or diagnosis of SARS-CoV-2 by FDA under an Emergency Use Authorization (EUA). This EUA will remain  in effect (meaning this test can be used) for the duration of the COVID-19 declaration  under Section 564(b)(1) of the Act, 21 U.S.C.section 360bbb-3(b)(1), unless the authorization is terminated  or revoked sooner.       Influenza A by PCR NEGATIVE NEGATIVE Final   Influenza B by PCR NEGATIVE NEGATIVE Final    Comment: (NOTE) The Xpert Xpress SARS-CoV-2/FLU/RSV plus assay is intended as an aid in the diagnosis of influenza  from Nasopharyngeal swab specimens and should not be used as a sole basis for treatment. Nasal washings and aspirates are unacceptable for Xpert Xpress SARS-CoV-2/FLU/RSV testing.  Fact Sheet for Patients: EntrepreneurPulse.com.au  Fact Sheet for Healthcare Providers: IncredibleEmployment.be  This test is not yet approved or cleared by the [place] FDA and has been authorized for detection and/or diagnosis of SARS-CoV-2 by FDA under an Emergency Use Authorization (EUA). This EUA will remain in effect (meaning this test can be used) for the duration of the COVID-19 declaration under Section 564(b)(1) of the Act, 21 U.S.C. section 360bbb-3(b)(1), unless the authorization is terminated or revoked.  Performed at [facility], [place] [address], [place], [place] [address]          Radiology Studies: No results found.    Scheduled Meds:  amiodarone  200 mg Oral Daily   insulin aspart  0-9 Units Subcutaneous TID WC   ketoconazole   Topical Daily   mometasone-formoterol  2 puff Inhalation BID   rosuvastatin  40 mg Oral Daily   sodium chloride flush  3 mL Intravenous Q12H   Continuous Infusions:  meropenem (MERREM) IV 1 g (06/25/21 1110)     LOS: 2 days      Debbe Odea, MD [facility] Pager: [email or web address] 06/25/2021, 12:42 PM

## 2021-06-25 NOTE — Progress Notes (Signed)
Pt is still urinating diluted bloody urine. Pt doesn't c/o of any pain.

## 2021-06-26 ENCOUNTER — Telehealth: Payer: Self-pay | Admitting: Internal Medicine

## 2021-06-26 DIAGNOSIS — E1142 Type 2 diabetes mellitus with diabetic polyneuropathy: Secondary | ICD-10-CM

## 2021-06-26 DIAGNOSIS — Z951 Presence of aortocoronary bypass graft: Secondary | ICD-10-CM

## 2021-06-26 DIAGNOSIS — D696 Thrombocytopenia, unspecified: Secondary | ICD-10-CM

## 2021-06-26 DIAGNOSIS — E1169 Type 2 diabetes mellitus with other specified complication: Secondary | ICD-10-CM

## 2021-06-26 DIAGNOSIS — I5020 Unspecified systolic (congestive) heart failure: Secondary | ICD-10-CM

## 2021-06-26 DIAGNOSIS — I441 Atrioventricular block, second degree: Secondary | ICD-10-CM

## 2021-06-26 DIAGNOSIS — I480 Paroxysmal atrial fibrillation: Secondary | ICD-10-CM

## 2021-06-26 DIAGNOSIS — E785 Hyperlipidemia, unspecified: Secondary | ICD-10-CM

## 2021-06-26 LAB — CBC
HCT: 37.8 % — ABNORMAL LOW (ref 39.0–52.0)
Hemoglobin: 12.4 g/dL — ABNORMAL LOW (ref 13.0–17.0)
MCH: 33.2 pg (ref 26.0–34.0)
MCHC: 32.8 g/dL (ref 30.0–36.0)
MCV: 101.3 fL — ABNORMAL HIGH (ref 80.0–100.0)
Platelets: 80 10*3/uL — ABNORMAL LOW (ref 150–400)
RBC: 3.73 MIL/uL — ABNORMAL LOW (ref 4.22–5.81)
RDW: 15.3 % (ref 11.5–15.5)
WBC: 5.3 10*3/uL (ref 4.0–10.5)
nRBC: 0 % (ref 0.0–0.2)

## 2021-06-26 LAB — BASIC METABOLIC PANEL
Anion gap: 6 (ref 5–15)
BUN: 18 mg/dL (ref 8–23)
CO2: 23 mmol/L (ref 22–32)
Calcium: 9.3 mg/dL (ref 8.9–10.3)
Chloride: 107 mmol/L (ref 98–111)
Creatinine, Ser: 1.45 mg/dL — ABNORMAL HIGH (ref 0.61–1.24)
GFR, Estimated: 47 mL/min — ABNORMAL LOW (ref >60)
Glucose, Bld: 133 mg/dL — ABNORMAL HIGH (ref 70–99)
Potassium: 4.5 mmol/L (ref 3.5–5.1)
Sodium: 136 mmol/L (ref 135–145)

## 2021-06-26 LAB — GLUCOSE, CAPILLARY
Glucose-Capillary: 124 mg/dL — ABNORMAL HIGH (ref 70–99)
Glucose-Capillary: 127 mg/dL — ABNORMAL HIGH (ref 70–99)
Glucose-Capillary: 117 mg/dL — ABNORMAL HIGH (ref 70–99)
Glucose-Capillary: 182 mg/dL — ABNORMAL HIGH (ref 70–99)

## 2021-06-26 MED ORDER — SODIUM CHLORIDE 0.9 % IV SOLN
1.0000 g | Freq: Two times a day (BID) | INTRAVENOUS | Status: DC
  Administered 2021-06-26 – 2021-06-27 (×3): 1 g via INTRAVENOUS
  Filled 2021-06-26 (×4): qty 1

## 2021-06-26 MED ORDER — SODIUM CHLORIDE 0.9 % IV SOLN
INTRAVENOUS | Status: DC
Start: 2021-06-26 — End: 2021-06-27

## 2021-06-26 NOTE — Telephone Encounter (Signed)
  Pt's daughter calling, she said pt is in [place] and have to stay there for 7 days. She cancelled pt's appt with Dr. [name] and asked if he can check his pacemaker while he's in the hospital. She said pt is in room 1610

## 2021-06-26 NOTE — Progress Notes (Signed)
PHARMACY NOTE:  ANTIMICROBIAL RENAL DOSAGE ADJUSTMENT  Current antimicrobial regimen includes a mismatch between antimicrobial dosage and estimated renal function.  As per policy approved by the Pharmacy & Therapeutics and Medical Executive Committees, the antimicrobial dosage will be adjusted accordingly.  Current antimicrobial dosage:  Meropenem 1 g iv q 8 hours  Indication: MDRO UTI  Renal Function:  Estimated Creatinine Clearance: 38.9 mL/min (A) (by C-G formula based on SCr of 1.45 mg/dL (H)). []      On intermittent HD, scheduled: []      On CRRT    Antimicrobial dosage has been changed to:  Meropenem 1  g iv q 12 hours  Additional comments:   Thank you for allowing pharmacy to be a part of this patient's care.  Napoleon Form, [facility] 06/26/2021 7:33 AM

## 2021-06-26 NOTE — Telephone Encounter (Signed)
Spoke with pt. Daughter.  Advised pt will need to reschedule his appt to have his device checked.  Pt daughter to call to reschedule appt.

## 2021-06-26 NOTE — Progress Notes (Signed)
PROGRESS NOTE    [name]  ZJQ:[id number] DOB: 12-20-1932 DOA: 06/23/2021 PCP: [name], DO   Brief Narrative: Patient is an 87 year old elderly Caucasian male with past medical history significant for but not limited to PAF on anticoagulation with Eliquis, chronic systolic CHF with a EF of 35%, CAD status post CABG, Mobitz type II 6 second-degree AV block status post permanent pacemaker, COPD, diabetes mellitus type 2, hypertension, hyperlipidemia, thrombocytopenia, history of recurrent UTIs, history of OSA not tolerating CPAP presented the ED given evaluation of right leg pain.  He is being treated with Keflex for UTI in outpatient setting this was changed to Bactrim and his urinary symptoms did not improve.  He was recently diagnosed with multidrug-resistant [name] UTI urine culture on 06/04/2021.  He presented to the ED on 06/24/2019 and began to see blood in his urine and developed flank pain.  He had not been having any subjective fevers or chills, diaphoresis or chest pain or vomiting.  He does take Eliquis regularly and denies any other obvious bleeding including epistaxis, hemoptysis hematemesis, hematochezia or melena.  The blood in his urine is now improving and urine culture here is growing Enterococcus faecalis now awaiting sensitivities.  He has been placed on IV meropenem and his hematuria is been improving.  Urology was consulted for his nephrolithiasis and they are recommending outpatient management for elective bilateral ureteroscopy.  Assessment & Plan:   Principal Problem:   Complicated UTI (urinary tract infection) Active Problems:   Diabetes (HCC)   S/P CABG x 3   Thrombocytopenia (HCC)   Second degree Mobitz II AV block   Paroxysmal atrial fibrillation (HCC)   Hyperlipidemia associated with type 2 diabetes mellitus (HCC)   HFrEF (heart failure with reduced ejection fraction) (HCC)   AKI (acute kidney injury) ([place])  Right-sided flank pain  with in the setting of Enterococcus faecalis UTI with nephrolithiasis and Gross Hematuria -Recently was treated for multidrug-resistant Morganella on prior culture on 06/04/2021 and prior to that he had Enterococcus faecalis -Currently he has bilateral nonobstructive calculi that are present in the kidneys and the ureters -Is now growing Enterococcus faecalis but only growing 20,000 colonies.  Reportedly he is allergic to penicillin and was unable to use ampicillin but pharmacy has confirmed with the patient that he does not really have an allergy so can change to ampicillin based on sensitivities once they result -Urology was consulted yesterday given the multiple renal stones noted on imaging that may be acting as a nidus causing recurrent UTIs and Dr. Lannie Fields recommends that the patient would benefit from a repeat bilateral ureteroscopy in an elective setting with the goal of stone free to prevent obstruction removal possible nidus for recurrent infections; he is arranging follow-up in outpatient setting and patient [place] office -Patient's hematuria is now clearing up as well -The patient is afebrile and has no leukocytosis and anticipate discharging home on p.o. antibiotics the next 24 to 48 hours  Gross Hematuria -Improving and urine was a light pink this morning -In the setting of infection and right-sided nephrolithiasis -Hemoglobin/hematocrit remains relatively stable and went from 11.9/36.0 is now 12.4/37.8 -Eliquis was on hold for now but will resume once safe from a urology perspective  Hypotension -Patient blood pressure was low yesterday at 70/47 and then 70/35 in the left arm -He was given a 500 mL bolus which is improved; will start maintenance IV fluid hydration with normal saline at 75 MLS per hour -Avoid further antihypertensives and  hold as below -Continue monitor blood pressures per protocol and last blood pressure reading was 108/76  AKI on CKD stage IIIa -He has a  baseline creatinine of -Presented with a creatinine of 1.6 with -Creatinine improved to 1.07 yesterday after holding diuretics and giving IV fluid hydration then IV fluid hydration has been held further -Patient BUNs/creatinine is now 18/1.45 -Will resume IV fluid hydration with normal saline at 75 MLS per hour -Avoid further nephrotoxic medications, contrast dyes, hypotension and dehydration and renally adjust medications -Continue to monitor and trend renal function carefully repeat CMP in a.m.  Chronic systolic CHF with a heart failure reduced ejection fraction of 35% -Had a EF of 35% on TTE on 04/25/2021 -Currently holding Entresto, furosemide and spironolactone given his hypotension as above and AKI -Resume slowly  Paroxysmal atrial fibrillation -Currently was holding apixaban now while treating his UTI and given his hematuria; anticipating resuming in the a.m. -Held his metoprolol due to his hypotension -We will continue Amiodarone 200 mg p.o. daily  CAD status post CABG x3 -Holding metoprolol due to his hypotension as well as Entresto, furosemide and spironolactone given AKI -No chest pain currently  Thrombocytopenia -Patient's platelet count went from 77 is now 89-continue to monitor for signs and symptoms of bleeding; gross hematuria is improving -Repeat CBC in a.m.  Hyperlipidemia associate with type II  Diabetes Mellitus Type 2 -Continue with Rosuvastatin 40 mg po Daily   Diabetes Mellitus Type 2 -Continue with sensitive NovoLog/scale insulin AC -Continue to hold metformin for now -Hemoglobin A1c was 6.1 -CBGs ranging from 124-195  COPD -Currently not in exacerbation -Continue with mometasone-formoterol 2 puffs IH twice daily as well as albuterol 2.5 mg nebs every 6 as needed for wheezing or shortness of breath   DVT prophylaxis: Holding his apixaban Code Status: FULL CODE Family Communication: Discussed with friend at bedside Disposition Plan: Anticipating  discharging home in next 24 to 48 hours as well as pending PT OT evaluation Status is: Inpatient  Remains inpatient appropriate because: Awaiting culture sensitivities for final antibiotic recommendations as well as evaluation by PT and OT  Consultants:  Urology  Procedures: None  Antimicrobials:  Anti-infectives (From admission, onward)    Start     Dose/Rate Route Frequency Ordered Stop   06/26/21 1200  meropenem (MERREM) 1 g in sodium chloride 0.9 % 100 mL IVPB        1 g 200 mL/hr over 30 Minutes Intravenous Every 12 hours 06/26/21 0732     06/25/21 1000  meropenem (MERREM) 1 g in sodium chloride 0.9 % 100 mL IVPB  Status:  Discontinued        1 g 200 mL/hr over 30 Minutes Intravenous Every 8 hours 06/25/21 0655 06/26/21 0732   06/24/21 0230  meropenem (MERREM) 1 g in sodium chloride 0.9 % 100 mL IVPB  Status:  Discontinued        1 g 200 mL/hr over 30 Minutes Intravenous Every 12 hours 06/23/21 1348 06/25/21 0655   06/23/21 1345  sulfamethoxazole-trimethoprim (BACTRIM DS) 800-160 MG per tablet 1 tablet  Status:  Discontinued        1 tablet Oral  Once 06/23/21 1331 06/23/21 1333   06/23/21 1345  meropenem (MERREM) 1,000 mg in sodium chloride 0.9 % 100 mL IVPB        1,000 mg 200 mL/hr over 30 Minutes Intravenous Once 06/23/21 1344 06/23/21 1445        Subjective: Seen and examined at bedside and thinks that  his urine is clearing up now.  Denies any chest pain or shortness of breath.  Hoping to go home by Friday given that his anniversary is on Friday.  No lightheadedness or dizziness.  Urine culture growing out Enterococcus faecalis still awaiting sensitivities.  No other concerns or complaints this time.  Objective: Vitals:   06/25/21 2135 06/26/21 0458 06/26/21 0503 06/26/21 0735  BP: 105/86 108/76    Pulse: 75 75    Resp: 16 18    Temp: 97.9 F (36.6 C) (!) 97.3 F (36.3 C)    TempSrc: Oral Oral    SpO2: 95% 99%  97%  Weight:   89.6 kg   Height:         Intake/Output Summary (Last 24 hours) at 06/26/2021 1314 Last data filed at 06/26/2021 0813 Gross per 24 hour  Intake 664.3 ml  Output 1240 ml  Net -575.7 ml   Filed Weights   06/23/21 2031 06/25/21 0431 06/26/21 0503  Weight: 87.4 kg 88.2 kg 89.6 kg   Examination: Physical Exam:  Constitutional: Elderly overweight Caucasian male currently in no acute distress Eyes: Lids and conjunctivae normal, sclerae anicteric  ENMT: External Ears, Nose appear normal. Grossly normal hearing. Mucous membranes are moist.  Neck: Appears normal, supple, no cervical masses, normal ROM, no appreciable thyromegaly; no appreciable JVD Respiratory: Diminished to auscultation bilaterally, no wheezing, rales, rhonchi or crackles. Normal respiratory effort and patient is not tachypenic. No accessory muscle use.  Not wearing any supplemental oxygen via nasal cannula Cardiovascular: RRR, no murmurs / rubs / gallops. S1 and S2 auscultated.  Trace extremity edema Abdomen: Soft, non-tender, distended secondary body habitus.  Bowel sounds positive.  GU: Deferred.  Has lightly pink urine in the urinal next to the bedside Musculoskeletal: No clubbing / cyanosis of digits/nails. No joint deformity upper and lower extremities.  Skin: No rashes, lesions, ulcers. No induration; Warm and dry.  Neurologic: CN 2-12 grossly intact with no focal deficits. Romberg sign and cerebellar reflexes not assessed.  Psychiatric: Normal judgment and insight. Alert and oriented x 3. Normal mood and appropriate affect.   Data Reviewed: I have personally reviewed following labs and imaging studies  CBC: Recent Labs  Lab 06/23/21 1134 06/24/21 0533 06/26/21 0534  WBC 5.4 4.8 5.3  NEUTROABS 3.2  --   --   HGB 13.0 11.9* 12.4*  HCT 39.6 36.0* 37.8*  MCV 99.2 100.8* 101.3*  PLT 81* 77* 80*   Basic Metabolic Panel: Recent Labs  Lab 06/23/21 1134 06/24/21 0533 06/25/21 0504 06/26/21 0534  NA 138 135 136 136  K 4.5 4.3 4.5  4.5  CL 105 107 107 107  CO2 24 24 22 23   GLUCOSE 93 116* 132* 133*  BUN 17 21 17 18   CREATININE 1.61* 1.48* 1.07 1.45*  CALCIUM 9.6 8.9 9.2 9.3   GFR: Estimated Creatinine Clearance: 38.9 mL/min (A) (by C-G formula based on SCr of 1.45 mg/dL (H)). Liver Function Tests: Recent Labs  Lab 06/23/21 1134  AST 24  ALT 15  ALKPHOS 63  BILITOT 0.8  PROT 6.8  ALBUMIN 3.9   No results for input(s): LIPASE, AMYLASE in the last 168 hours. No results for input(s): AMMONIA in the last 168 hours. Coagulation Profile: No results for input(s): INR, PROTIME in the last 168 hours. Cardiac Enzymes: No results for input(s): CKTOTAL, CKMB, CKMBINDEX, TROPONINI in the last 168 hours. BNP (last 3 results) No results for input(s): PROBNP in the last 8760 hours. HbA1C: No results for  input(s): HGBA1C in the last 72 hours. CBG: Recent Labs  Lab 06/25/21 1124 06/25/21 1619 06/25/21 2137 06/26/21 0755 06/26/21 1211  GLUCAP 195* 143* 144* 124* 127*   Lipid Profile: No results for input(s): CHOL, HDL, LDLCALC, TRIG, CHOLHDL, LDLDIRECT in the last 72 hours. Thyroid Function Tests: No results for input(s): TSH, T4TOTAL, FREET4, T3FREE, THYROIDAB in the last 72 hours. Anemia Panel: No results for input(s): VITAMINB12, FOLATE, FERRITIN, TIBC, IRON, RETICCTPCT in the last 72 hours. Sepsis Labs: No results for input(s): PROCALCITON, LATICACIDVEN in the last 168 hours.  Recent Results (from the past 240 hour(s))  Urine Culture     Status: Abnormal (Preliminary result)   Collection Time: 06/23/21  9:10 AM   Specimen: Urine, Clean Catch  Result Value Ref Range Status   Specimen Description   Final    URINE, CLEAN CATCH Performed at [facility], [address], [place], [place] [address]    Special Requests   Final    NONE Performed at [facility], [place], [place] [address]    Culture (A)  Final    20,000 COLONIES/mL ENTEROCOCCUS  FAECALIS REPEATING SENSITIVITY Performed at [facility], [place] [address], [place], [place] [address]    Report Status PENDING  Incomplete  Resp Panel by RT-PCR (Flu A&B, Covid) Nasopharyngeal Swab     Status: None   Collection Time: 06/23/21  7:10 PM   Specimen: Nasopharyngeal Swab; Nasopharyngeal(NP) swabs in vial transport medium  Result Value Ref Range Status   SARS Coronavirus 2 by RT PCR NEGATIVE NEGATIVE Final    Comment: (NOTE) SARS-CoV-2 target nucleic acids are NOT DETECTED.  The SARS-CoV-2 RNA is generally detectable in upper respiratory specimens during the acute phase of infection. The lowest concentration of SARS-CoV-2 viral copies this assay can detect is 138 copies/mL. A negative result does not preclude SARS-Cov-2 infection and should not be used as the sole basis for treatment or other patient management decisions. A negative result may occur with  improper specimen collection/handling, submission of specimen other than nasopharyngeal swab, presence of viral mutation(s) within the areas targeted by this assay, and inadequate number of viral copies(<138 copies/mL). A negative result must be combined with clinical observations, patient history, and epidemiological information. The expected result is Negative.  Fact Sheet for Patients:  EntrepreneurPulse.com.au  Fact Sheet for Healthcare Providers:  IncredibleEmployment.be  This test is no t yet approved or cleared by the [place] FDA and  has been authorized for detection and/or diagnosis of SARS-CoV-2 by FDA under an Emergency Use Authorization (EUA). This EUA will remain  in effect (meaning this test can be used) for the duration of the COVID-19 declaration under Section 564(b)(1) of the Act, 21 U.S.C.section 360bbb-3(b)(1), unless the authorization is terminated  or revoked sooner.       Influenza A by PCR NEGATIVE NEGATIVE Final   Influenza B by PCR NEGATIVE  NEGATIVE Final    Comment: (NOTE) The Xpert Xpress SARS-CoV-2/FLU/RSV plus assay is intended as an aid in the diagnosis of influenza from Nasopharyngeal swab specimens and should not be used as a sole basis for treatment. Nasal washings and aspirates are unacceptable for Xpert Xpress SARS-CoV-2/FLU/RSV testing.  Fact Sheet for Patients: EntrepreneurPulse.com.au  Fact Sheet for Healthcare Providers: IncredibleEmployment.be  This test is not yet approved or cleared by the [place] FDA and has been authorized for detection and/or diagnosis of SARS-CoV-2 by FDA under an Emergency Use Authorization (EUA). This EUA will remain  in effect (meaning this test can be used) for the duration of the COVID-19 declaration under Section 564(b)(1) of the Act, 21 U.S.C. section 360bbb-3(b)(1), unless the authorization is terminated or revoked.  Performed at [facility], [place] [address], [place], [place] [address]     RN Pressure Injury Documentation:     Estimated body mass index is 27.55 kg/m as calculated from the following:   Height as of this encounter: 5\' 11"  (1.803 m).   Weight as of this encounter: 89.6 kg.  Malnutrition Type:   Malnutrition Characteristics:   Nutrition Interventions:   Radiology Studies: No results found.  Scheduled Meds:  amiodarone  200 mg Oral Daily   Chlorhexidine Gluconate Cloth  6 each Topical Daily   insulin aspart  0-9 Units Subcutaneous TID WC   ketoconazole   Topical Daily   mometasone-formoterol  2 puff Inhalation BID   rosuvastatin  40 mg Oral Daily   sodium chloride flush  3 mL Intravenous Q12H   Continuous Infusions:  meropenem (MERREM) IV 1 g (06/26/21 1233)    LOS: 3 days   Kerney Elbe, DO [facility] PAGER is on AMION  If 7PM-7AM, please contact night-coverage [email or web address]

## 2021-06-26 NOTE — Care Management Important Message (Signed)
Important Message  Patient Details IM Letter given to the Patient. Name: [name] MRN: [id number] Date of Birth: 1932/10/23   Medicare Important Message Given:  Yes     [name] 06/26/2021, 11:59 AM

## 2021-06-27 ENCOUNTER — Ambulatory Visit (INDEPENDENT_AMBULATORY_CARE_PROVIDER_SITE_OTHER): Payer: Medicare Other | Admitting: Licensed Clinical Social Worker

## 2021-06-27 DIAGNOSIS — G4733 Obstructive sleep apnea (adult) (pediatric): Secondary | ICD-10-CM

## 2021-06-27 DIAGNOSIS — E1142 Type 2 diabetes mellitus with diabetic polyneuropathy: Secondary | ICD-10-CM

## 2021-06-27 DIAGNOSIS — N390 Urinary tract infection, site not specified: Secondary | ICD-10-CM

## 2021-06-27 DIAGNOSIS — I25810 Atherosclerosis of coronary artery bypass graft(s) without angina pectoris: Secondary | ICD-10-CM

## 2021-06-27 DIAGNOSIS — C61 Malignant neoplasm of prostate: Secondary | ICD-10-CM

## 2021-06-27 DIAGNOSIS — E1169 Type 2 diabetes mellitus with other specified complication: Secondary | ICD-10-CM

## 2021-06-27 DIAGNOSIS — E785 Hyperlipidemia, unspecified: Secondary | ICD-10-CM

## 2021-06-27 DIAGNOSIS — E538 Deficiency of other specified B group vitamins: Secondary | ICD-10-CM

## 2021-06-27 DIAGNOSIS — Z951 Presence of aortocoronary bypass graft: Secondary | ICD-10-CM

## 2021-06-27 DIAGNOSIS — I480 Paroxysmal atrial fibrillation: Secondary | ICD-10-CM

## 2021-06-27 DIAGNOSIS — Z22338 Carrier of other streptococcus: Secondary | ICD-10-CM

## 2021-06-27 LAB — CBC WITH DIFFERENTIAL/PLATELET
Abs Immature Granulocytes: 0.02 10*3/uL (ref 0.00–0.07)
Basophils Absolute: 0 10*3/uL (ref 0.0–0.1)
Basophils Relative: 0 %
Eosinophils Absolute: 0 10*3/uL (ref 0.0–0.5)
Eosinophils Relative: 1 %
HCT: 35.1 % — ABNORMAL LOW (ref 39.0–52.0)
Hemoglobin: 11.7 g/dL — ABNORMAL LOW (ref 13.0–17.0)
Immature Granulocytes: 0 %
Lymphocytes Relative: 15 %
Lymphs Abs: 0.8 10*3/uL (ref 0.7–4.0)
MCH: 33.1 pg (ref 26.0–34.0)
MCHC: 33.3 g/dL (ref 30.0–36.0)
MCV: 99.2 fL (ref 80.0–100.0)
Monocytes Absolute: 0.6 10*3/uL (ref 0.1–1.0)
Monocytes Relative: 12 %
Neutro Abs: 3.7 10*3/uL (ref 1.7–7.7)
Neutrophils Relative %: 72 %
Platelets: 73 10*3/uL — ABNORMAL LOW (ref 150–400)
RBC: 3.54 MIL/uL — ABNORMAL LOW (ref 4.22–5.81)
RDW: 15.2 % (ref 11.5–15.5)
WBC: 5.2 10*3/uL (ref 4.0–10.5)
nRBC: 0 % (ref 0.0–0.2)

## 2021-06-27 LAB — COMPREHENSIVE METABOLIC PANEL
ALT: 19 U/L (ref 0–44)
AST: 27 U/L (ref 15–41)
Albumin: 3.1 g/dL — ABNORMAL LOW (ref 3.5–5.0)
Alkaline Phosphatase: 61 U/L (ref 38–126)
Anion gap: 5 (ref 5–15)
BUN: 17 mg/dL (ref 8–23)
CO2: 22 mmol/L (ref 22–32)
Calcium: 9 mg/dL (ref 8.9–10.3)
Chloride: 108 mmol/L (ref 98–111)
Creatinine, Ser: 1.18 mg/dL (ref 0.61–1.24)
GFR, Estimated: >60 mL/min (ref >60)
Glucose, Bld: 129 mg/dL — ABNORMAL HIGH (ref 70–99)
Potassium: 4.8 mmol/L (ref 3.5–5.1)
Sodium: 135 mmol/L (ref 135–145)
Total Bilirubin: 0.9 mg/dL (ref 0.3–1.2)
Total Protein: 5.8 g/dL — ABNORMAL LOW (ref 6.5–8.1)

## 2021-06-27 LAB — MAGNESIUM: Magnesium: 2.2 mg/dL (ref 1.7–2.4)

## 2021-06-27 LAB — GLUCOSE, CAPILLARY
Glucose-Capillary: 125 mg/dL — ABNORMAL HIGH (ref 70–99)
Glucose-Capillary: 169 mg/dL — ABNORMAL HIGH (ref 70–99)

## 2021-06-27 LAB — PHOSPHORUS: Phosphorus: 2.1 mg/dL — ABNORMAL LOW (ref 2.5–4.6)

## 2021-06-27 MED ORDER — APIXABAN 5 MG PO TABS
5.0000 mg | ORAL_TABLET | Freq: Two times a day (BID) | ORAL | Status: DC
  Administered 2021-06-27: 5 mg via ORAL
  Filled 2021-06-27: qty 1

## 2021-06-27 MED ORDER — POTASSIUM & SODIUM PHOSPHATES 280-160-250 MG PO PACK
1.0000 | PACK | Freq: Three times a day (TID) | ORAL | Status: DC
  Administered 2021-06-27: 1 via ORAL
  Filled 2021-06-27 (×3): qty 1

## 2021-06-27 MED ORDER — FUROSEMIDE 20 MG PO TABS: 20.0000 mg | ORAL_TABLET | ORAL | Status: DC

## 2021-06-27 MED ORDER — KETOCONAZOLE 2 % EX CREA: TOPICAL_CREAM | Freq: Every day | CUTANEOUS | 0 refills | Status: DC

## 2021-06-27 NOTE — Discharge Summary (Signed)
Physician Discharge Summary  [name] KGM:[id number] DOB: May 17, 1933 DOA: 06/23/2021  PCP: [name], DO  Admit date: 06/23/2021 Discharge date: 06/27/2021  Admitted From: Home Disposition:  Home  Recommendations for Outpatient Follow-up:  Follow up with PCP in 1-2 weeks Follow-up with cardiology within 1 to 2 weeks Follow-up with urology within 1 to 2 weeks and have outpatient management with stone lithotripsy Please obtain CMP/CBC, Mag, Phos in one week Please follow up on the following pending results:  Home Health: No  Equipment/Devices: None     Discharge Condition: Stable  CODE STATUS: FULL CODE  Diet recommendation: Heart Healthy Carb Modified Diet  Brief/Interim Summary: Patient is an 87 year old elderly Caucasian male with past medical history significant for but not limited to PAF on anticoagulation with Eliquis, chronic systolic CHF with a EF of 35%, CAD status post CABG, Mobitz type II 6 second-degree AV block status post permanent pacemaker, COPD, diabetes mellitus type 2, hypertension, hyperlipidemia, thrombocytopenia, history of recurrent UTIs, history of OSA not tolerating CPAP presented the ED given evaluation of right leg pain.  He is being treated with Keflex for UTI in outpatient setting this was changed to Bactrim and his urinary symptoms did not improve.  He was recently diagnosed with multidrug-resistant [name] UTI urine culture on 06/04/2021.  He presented to the ED on 06/24/2019 and began to see blood in his urine and developed flank pain.  He had not been having any subjective fevers or chills, diaphoresis or chest pain or vomiting.  He does take Eliquis regularly and denies any other obvious bleeding including epistaxis, hemoptysis hematemesis, hematochezia or melena.  The blood in his urine is now improving and urine culture here is growing Enterococcus faecalis now awaiting sensitivities.  He has been placed on IV meropenem and his  hematuria is been improving.  Urology was consulted for his nephrolithiasis and they are recommending outpatient management for elective bilateral ureteroscopy.  Enterococcus grew out VRE that is pan resistant.  ID was consulted and they felt the patient actually had a colonization and not a true infection so they recommended discontinuing antibiotics.  Patient was deemed medically stable to be discharged and will need to follow-up with his PCP as well as urology in the outpatient setting.  Discharge Diagnoses:  Principal Problem:   Complicated UTI (urinary tract infection) Active Problems:   Diabetes ([place])   S/P CABG x 3   Thrombocytopenia (H[facility])   Second degree Mobitz II AV block   Paroxysmal atrial fibrillation (HCC)   Hyperlipidemia associated with type 2 diabetes mellitus (HCC)   HFrEF (heart failure with reduced ejection fraction) (HCC)   AKI (acute kidney injury) ([place])  Right-sided flank pain with in the setting of nephrolithiasis with gross hematuria; initially thought to have Enterococcus UTI but ID feels that this is a colonization -Recently was treated for multidrug-resistant Morganella on prior culture on 06/04/2021 and prior to that he had Enterococcus faecalis -Currently he has bilateral nonobstructive calculi that are present in the kidneys and the ureters -Is now growing Enterococcus faecalis but only growing 20,000 colonies.  And ID feels that this is a colonization; they feel that he has VRE is not a true infection given lack of symptoms -Urology was consulted given the multiple renal stones noted on imaging that may be acting as a nidus causing recurrent UTIs and Dr. Alexis Frock recommends that the patient would benefit from a repeat bilateral ureteroscopy in an elective setting with the goal of stone free  to prevent obstruction removal possible nidus for recurrent infections; he is arranging follow-up in outpatient setting and patient [place] office -Patient's hematuria  is now clearing up as well -The patient is afebrile and has no leukocytosis and ID has recommended discontinuing antibiotics will be discharged home   Gross Hematuria -Improving and urine was a light pink this morning -In the setting of infection and right-sided nephrolithiasis -Hemoglobin/hematocrit remains relatively stable and went from 11.9/36.0 is now 12.4/37.8 yesterday and today it is 11.7/35.1 -Eliquis was on hold but resumed today given that his hemoglobin is stable and he has no further gross hematuria   Hypotension -Patient blood pressure was low yesterday at 70/47 and then 70/35 in the left arm -He was given a 500 mL bolus which is improved; will start maintenance IV fluid hydration with normal saline at 75 MLS per hour -Avoid further antihypertensives and hold as below -Continue monitor blood pressures per protocol and last blood pressure reading was 107/81 -Resume home antihypertensives at the discretion of cardiology and will need cardiology follow-up within 1 week   AKI on CKD stage IIIa -He has a baseline creatinine of -Presented with a creatinine of 1.6 with -Creatinine improved to 1.07 yesterday after holding diuretics and giving IV fluid hydration then IV fluid hydration has been held further -Patient BUNs/creatinine is now 18/1.45 yesterday and improved to 17/1.18 -Will resume IV fluid hydration with normal saline at 75 MLS per hour -Avoid further nephrotoxic medications, contrast dyes, hypotension and dehydration and renally adjust medications -Continue to monitor and trend renal function carefully repeat CMP in a.m.   Chronic systolic CHF with a heart failure reduced ejection fraction of 35% -Had a EF of 35% on TTE on 04/25/2021 -Currently holding Entresto, furosemide and spironolactone given his hypotension as above and AKI -Resume slowly as an outpatient at the discretion of cardiology   Paroxysmal atrial fibrillation -Currently was holding apixaban now while  treating his UTI and given his hematuria; anticipating resuming in the a.m. -Held his metoprolol due to his hypotension -We will continue Amiodarone 200 mg p.o. daily  Hypophosphatemia -Phosphorus is mild at 2.1 -Replete with Phos-Nak prior to discharge Continue to monitor and trend and repeat ultrasound in outpatient setting   CAD status post CABG x3 -Holding metoprolol due to his hypotension as well as Entresto, furosemide and spironolactone given AKI -No chest pain currently   Thrombocytopenia -Patient's platelet count went from 77 is now 15 yesterday and today is 73  -continue to monitor for signs and symptoms of bleeding; gross hematuria is improving -Repeat CBC in a.m.   Hyperlipidemia associate with type II  Diabetes Mellitus Type 2 -Continue with Rosuvastatin 40 mg po Daily    Diabetes Mellitus Type 2 -Continue with sensitive NovoLog/scale insulin AC -Continue to hold metformin for now -Hemoglobin A1c was 6.1 -CBGs ranging from 117-182   COPD -Currently not in exacerbation -Continue with mometasone-formoterol 2 puffs IH twice daily as well as albuterol 2.5 mg nebs every 6 as needed for wheezing or shortness of breath  Discharge Instructions  Discharge Instructions     Call MD for:  difficulty breathing, headache or visual disturbances   Complete by: As directed    Call MD for:  extreme fatigue   Complete by: As directed    Call MD for:  hives   Complete by: As directed    Call MD for:  persistant dizziness or light-headedness   Complete by: As directed    Call MD for:  persistant  nausea and vomiting   Complete by: As directed    Call MD for:  redness, tenderness, or signs of infection (pain, swelling, redness, odor or green/yellow discharge around incision site)   Complete by: As directed    Call MD for:  severe uncontrolled pain   Complete by: As directed    Call MD for:  temperature >100.4   Complete by: As directed    Diet - low sodium heart healthy    Complete by: As directed    Discharge instructions   Complete by: As directed    You were cared for by a hospitalist during your hospital stay. If you have any questions about your discharge medications or the care you received while you were in the hospital after you are discharged, you can call the unit and ask to speak with the hospitalist on call if the hospitalist that took care of you is not available. Once you are discharged, your primary care physician will handle any further medical issues. Please note that NO REFILLS for any discharge medications will be authorized once you are discharged, as it is imperative that you return to your primary care physician (or establish a relationship with a primary care physician if you do not have one) for your aftercare needs so that they can reassess your need for medications and monitor your lab values.  Follow up with PCP, Cardiology and Urology within 1-2 weeks. Take all medications as prescribed. If symptoms change or worsen please return to the ED for evaluation   Increase activity slowly   Complete by: As directed       Allergies as of 06/27/2021       Reactions   Penicillins Other (See Comments)   Unknown   Tramadol Other (See Comments)   Dizzy   Ketorolac Tromethamine Rash        Medication List     STOP taking these medications    lidocaine 5 % ointment Commonly known as: XYLOCAINE   senna-docusate 8.6-50 MG tablet Commonly known as: Senokot-S   triamcinolone cream 0.1 % Commonly known as: KENALOG       TAKE these medications    acetaminophen 500 MG tablet Commonly known as: TYLENOL Take 500-1,000 mg by mouth every 6 (six) hours as needed for moderate pain.   albuterol 108 (90 Base) MCG/ACT inhaler Commonly known as: VENTOLIN HFA Inhale 2 puffs into the lungs every 6 (six) hours as needed for wheezing or shortness of breath.   amiodarone 200 MG tablet Commonly known as: PACERONE TAKE 1 TABLET BY MOUTH  DAILY    apixaban 5 MG Tabs tablet Commonly known as: Eliquis Take 1 tablet (5 mg total) by mouth 2 (two) times daily.   Besivance 0.6 % Susp Generic drug: Besifloxacin HCl Place 1 drop into both eyes daily as needed (dry eyes).   budesonide-formoterol 160-4.5 MCG/ACT inhaler Commonly known as: SYMBICORT Inhale 2 puffs into the lungs 2 (two) times daily.   furosemide 20 MG tablet Commonly known as: LASIX Take 1 tablet (20 mg total) by mouth every other day.   ketoconazole 2 % cream Commonly known as: NIZORAL Apply topically daily. Start taking on: June 28, 2021   meclizine 25 MG tablet Commonly known as: ANTIVERT Take 1 tablet (25 mg total) by mouth 3 (three) times daily as needed for dizziness.   metFORMIN 500 MG tablet Commonly known as: GLUCOPHAGE TAKE 1 TABLET BY MOUTH  TWICE DAILY WITH A MEAL   metoprolol succinate 25  MG 24 hr tablet Commonly known as: TOPROL-XL Take 0.5 tablets (12.5 mg total) by mouth daily. What changed: how much to take   omeprazole 20 MG capsule Commonly known as: PRILOSEC TAKE 1 CAPSULE BY MOUTH  DAILY What changed:  how much to take how to take this when to take this additional instructions   ondansetron 4 MG disintegrating tablet Commonly known as: Zofran ODT Take 1 tablet (4 mg total) by mouth every 8 (eight) hours as needed for nausea or vomiting.   onetouch ultrasoft lancets Use to check blood sugars daily   OneTouch Verio test strip Generic drug: glucose blood test blood sugars daily Dx E11.9   rosuvastatin 40 MG tablet Commonly known as: Crestor Take 1 tablet (40 mg total) by mouth daily. To REPLACE Atorvastatin What changed: additional instructions   sacubitril-valsartan 24-26 MG Commonly known as: ENTRESTO Take 1 tablet by mouth 2 (two) times daily.   spironolactone 25 MG tablet Commonly known as: ALDACTONE Take 0.5 tablets (12.5 mg total) by mouth daily.        Follow-up Information     Ronnie Doss M, DO.  Schedule an appointment as soon as possible for a visit in 2 days.   Specialty: Family Medicine Why: Follow up from ER visit Contact information: [place] [address] [phone]         [facility]. Go to .   Specialty: Emergency Medicine Why: As needed, If symptoms worsen Contact information: [place] [id number] [phone]        [name], MD. Schedule an appointment as soon as possible for a visit in 2 days.   Specialty: Urology Why: Follow up from ER visit Contact information: [place] [address] [phone]         Gottschalk, Blair, DO. Call.   Specialty: Family Medicine Why: Follow up within 1-2 weeks Contact information: [place] [address] [phone]         Arnoldo Lenis, MD .   Specialty: Cardiology Contact information: [place] [address] [phone]         Thompson Grayer, MD .   Specialty: Cardiology Contact information: [place] [address] [phone]                Allergies  Allergen Reactions   Penicillins Other (See Comments)    Unknown   Tramadol Other (See Comments)    Dizzy   Ketorolac Tromethamine Rash    Consultations: Urology Infectious diseases  Procedures/Studies: CT Renal Stone Study  Result Date: 06/23/2021 CLINICAL DATA:  Flank pain, hematuria. EXAM: CT ABDOMEN AND PELVIS WITHOUT CONTRAST TECHNIQUE: Multidetector CT imaging of the abdomen and pelvis was performed following the standard protocol without IV contrast. COMPARISON:  May 13, 2021. FINDINGS: Lower chest: No acute abnormality. Hepatobiliary: No focal liver abnormality is seen. Status post cholecystectomy. No biliary dilatation. Pancreas: Unremarkable. No pancreatic ductal dilatation or surrounding inflammatory changes. Spleen:  Normal in size without focal abnormality. Adrenals/Urinary Tract: Adrenal glands appear normal. Stable bilateral renal cysts are noted. Bilateral nonobstructive nephrolithiasis is noted. No hydronephrosis or renal obstruction is noted. Stable 1 cm calculus is seen in the right ureterovesical junction adjacent the urinary bladder which may represent chronic stone that is nonobstructive. Stomach/Bowel: Stomach is within normal limits. Appendix appears normal. No evidence of bowel wall thickening, distention,  or inflammatory changes. Vascular/Lymphatic: Aortic atherosclerosis. No enlarged abdominal or pelvic lymph nodes. Reproductive: Prostate is unremarkable. Other: Small fat containing supraumbilical ventral hernia is noted. No ascites is noted. Musculoskeletal: No acute or significant osseous findings. IMPRESSION: Bilateral nonobstructive nephrolithiasis. No hydronephrosis or renal obstruction is noted. Stable possible nonobstructive calculus at the right ureterovesical junction. Aortic Atherosclerosis (ICD10-I70.0). Electronically Signed   By: Marijo Conception M.D.   On: 06/23/2021 10:44     Subjective: Seen and examined at bedside and felt well and denies any chest pain or shortness of breath.  Denies any more hematuria and feels well.  Ready to go home.  No other concerns or complaints this time.   Discharge Exam: Vitals:   06/26/21 1929 06/27/21 0519  BP:  107/81  Pulse: 84 81  Resp: 18 19  Temp:  97.9 F (36.6 C)  SpO2: 91% 94%   Vitals:   06/26/21 1743 06/26/21 1929 06/27/21 0500 06/27/21 0519  BP: 133/62   107/81  Pulse: 72 84  81  Resp: 18 18  19   Temp: 98.4 F (36.9 C)   97.9 F (36.6 C)  TempSrc: Oral   Oral  SpO2: 97% 91%  94%  Weight:   90 kg   Height:       General: Pt is alert, awake, not in acute distress Cardiovascular: RRR, S1/S2 +, no rubs, no gallops Respiratory: Diminished bilaterally, no wheezing, no rhonchi; unlabored breathing and not wearing supplemental oxygen  per nasal cannula Abdominal: Soft, NT, distended secondary body habitus and he has an umbilical hernia noted, bowel sounds + Extremities: Minimal edema, no cyanosis  The results of significant diagnostics from this hospitalization (including imaging, microbiology, ancillary and laboratory) are listed below for reference.    Microbiology: Recent Results (from the past 240 hour(s))  Urine Culture     Status: Abnormal (Preliminary result)   Collection Time: 06/23/21  9:10 AM   Specimen: Urine, Clean Catch  Result Value Ref Range Status   Specimen Description   Final    URINE, CLEAN CATCH Performed at [facility], [address], [place], [place] [address]    Special Requests   Final    NONE Performed at [facility], [address], [place],  [address]    Culture (A)  Final    20,000 COLONIES/mL VANCOMYCIN RESISTANT ENTEROCOCCUS ISOLATED CORRECTED ON 11/03 AT 7824: PREVIOUSLY REPORTED AS 20,000 COLONIES/mL ENTEROCOCCUS FAECALIS   Report Status PENDING  Incomplete   Organism ID, Bacteria VANCOMYCIN RESISTANT ENTEROCOCCUS ISOLATED (A)  Final      Susceptibility   Vancomycin resistant enterococcus isolated - MIC*    AMPICILLIN >=32 RESISTANT Resistant     NITROFURANTOIN 64 INTERMEDIATE Intermediate     VANCOMYCIN >=32 RESISTANT Resistant     * 20,000 COLONIES/mL VANCOMYCIN RESISTANT ENTEROCOCCUS ISOLATED CORRECTED ON 11/03 AT 2353: PREVIOUSLY REPORTED AS 20,000 COLONIES/mL ENTEROCOCCUS FAECALIS  Resp Panel by RT-PCR (Flu A&B, Covid) Nasopharyngeal Swab     Status: None   Collection Time: 06/23/21  7:10 PM   Specimen: Nasopharyngeal Swab; Nasopharyngeal(NP) swabs in vial transport medium  Result Value Ref Range Status   SARS Coronavirus 2 by RT PCR NEGATIVE NEGATIVE Final    Comment: (NOTE) SARS-CoV-2 target nucleic acids are NOT DETECTED.  The SARS-CoV-2 RNA is generally detectable in upper respiratory specimens during the acute phase  of infection. The lowest concentration of SARS-CoV-2 viral copies this assay can detect is 138 copies/mL. A negative result does not preclude SARS-Cov-2  infection and should not be used as the sole basis for treatment or other patient management decisions. A negative result may occur with  improper specimen collection/handling, submission of specimen other than nasopharyngeal swab, presence of viral mutation(s) within the areas targeted by this assay, and inadequate number of viral copies(<138 copies/mL). A negative result must be combined with clinical observations, patient history, and epidemiological information. The expected result is Negative.  Fact Sheet for Patients:  EntrepreneurPulse.com.au  Fact Sheet for Healthcare Providers:  IncredibleEmployment.be  This test is no t yet approved or cleared by the [place] FDA and  has been authorized for detection and/or diagnosis of SARS-CoV-2 by FDA under an Emergency Use Authorization (EUA). This EUA will remain  in effect (meaning this test can be used) for the duration of the COVID-19 declaration under Section 564(b)(1) of the Act, 21 U.S.C.section 360bbb-3(b)(1), unless the authorization is terminated  or revoked sooner.       Influenza A by PCR NEGATIVE NEGATIVE Final   Influenza B by PCR NEGATIVE NEGATIVE Final    Comment: (NOTE) The Xpert Xpress SARS-CoV-2/FLU/RSV plus assay is intended as an aid in the diagnosis of influenza from Nasopharyngeal swab specimens and should not be used as a sole basis for treatment. Nasal washings and aspirates are unacceptable for Xpert Xpress SARS-CoV-2/FLU/RSV testing.  Fact Sheet for Patients: EntrepreneurPulse.com.au  Fact Sheet for Healthcare Providers: IncredibleEmployment.be  This test is not yet approved or cleared by the [place] FDA and has been authorized for detection and/or diagnosis of  SARS-CoV-2 by FDA under an Emergency Use Authorization (EUA). This EUA will remain in effect (meaning this test can be used) for the duration of the COVID-19 declaration under Section 564(b)(1) of the Act, 21 U.S.C. section 360bbb-3(b)(1), unless the authorization is terminated or revoked.  Performed at [facility], [place] [address], [place], [place] [address]      Labs: BNP (last 3 results) No results for input(s): BNP in the last 8760 hours. Basic Metabolic Panel: Recent Labs  Lab 06/23/21 1134 06/24/21 0533 06/25/21 0504 06/26/21 0534 06/27/21 0514  NA 138 135 136 136 135  K 4.5 4.3 4.5 4.5 4.8  CL 105 107 107 107 108  CO2 24 24 22 23 22   GLUCOSE 93 116* 132* 133* 129*  BUN 17 21 17 18 17   CREATININE 1.61* 1.48* 1.07 1.45* 1.18  CALCIUM 9.6 8.9 9.2 9.3 9.0  MG  --   --   --   --  2.2  PHOS  --   --   --   --  2.1*   Liver Function Tests: Recent Labs  Lab 06/23/21 1134 06/27/21 0514  AST 24 27  ALT 15 19  ALKPHOS 63 61  BILITOT 0.8 0.9  PROT 6.8 5.8*  ALBUMIN 3.9 3.1*   No results for input(s): LIPASE, AMYLASE in the last 168 hours. No results for input(s): AMMONIA in the last 168 hours. CBC: Recent Labs  Lab 06/23/21 1134 06/24/21 0533 06/26/21 0534 06/27/21 0514  WBC 5.4 4.8 5.3 5.2  NEUTROABS 3.2  --   --  3.7  HGB 13.0 11.9* 12.4* 11.7*  HCT 39.6 36.0* 37.8* 35.1*  MCV 99.2 100.8* 101.3* 99.2  PLT 81* 77* 80* 73*   Cardiac Enzymes: No results for input(s): CKTOTAL, CKMB, CKMBINDEX, TROPONINI in the last 168 hours. BNP: Invalid input(s): POCBNP CBG: Recent Labs  Lab 06/26/21 1211 06/26/21 1718 06/26/21 2225 06/27/21 0806 06/27/21 1148  GLUCAP 127* 117* 182*  125* 169*   D-Dimer No results for input(s): DDIMER in the last 72 hours. Hgb A1c No results for input(s): HGBA1C in the last 72 hours. Lipid Profile No results for input(s): CHOL, HDL, LDLCALC, TRIG, CHOLHDL, LDLDIRECT in the last 72 hours. Thyroid function  studies No results for input(s): TSH, T4TOTAL, T3FREE, THYROIDAB in the last 72 hours.  Invalid input(s): FREET3 Anemia work up No results for input(s): VITAMINB12, FOLATE, FERRITIN, TIBC, IRON, RETICCTPCT in the last 72 hours. Urinalysis    Component Value Date/Time   COLORURINE YELLOW 06/23/2021 0945   APPEARANCEUR CLEAR 06/23/2021 0945   APPEARANCEUR Cloudy (A) 06/04/2021 1501   LABSPEC 1.019 06/23/2021 0945   PHURINE 7.5 06/23/2021 0945   GLUCOSEU NEGATIVE 06/23/2021 0945   HGBUR SMALL (A) 06/23/2021 0945   BILIRUBINUR NEGATIVE 06/23/2021 0945   BILIRUBINUR Negative 06/04/2021 1501   KETONESUR NEGATIVE 06/23/2021 0945   PROTEINUR 100 (A) 06/23/2021 0945   UROBILINOGEN 1.0 03/28/2014 1208   NITRITE NEGATIVE 06/23/2021 0945   LEUKOCYTESUR TRACE (A) 06/23/2021 0945   Sepsis Labs Invalid input(s): PROCALCITONIN,  WBC,  LACTICIDVEN Microbiology Recent Results (from the past 240 hour(s))  Urine Culture     Status: Abnormal (Preliminary result)   Collection Time: 06/23/21  9:10 AM   Specimen: Urine, Clean Catch  Result Value Ref Range Status   Specimen Description   Final    URINE, CLEAN CATCH Performed at [facility], [address] [place], [place] [address]    Special Requests   Final    NONE Performed at [facility], [address] [place], [place] [address]    Culture (A)  Final    20,000 COLONIES/mL VANCOMYCIN RESISTANT ENTEROCOCCUS ISOLATED CORRECTED ON 11/03 AT 3818: PREVIOUSLY REPORTED AS 20,000 COLONIES/mL ENTEROCOCCUS FAECALIS   Report Status PENDING  Incomplete   Organism ID, Bacteria VANCOMYCIN RESISTANT ENTEROCOCCUS ISOLATED (A)  Final      Susceptibility   Vancomycin resistant enterococcus isolated - MIC*    AMPICILLIN >=32 RESISTANT Resistant     NITROFURANTOIN 64 INTERMEDIATE Intermediate     VANCOMYCIN >=32 RESISTANT Resistant     * 20,000 COLONIES/mL VANCOMYCIN RESISTANT ENTEROCOCCUS ISOLATED CORRECTED ON  11/03 AT 2993: PREVIOUSLY REPORTED AS 20,000 COLONIES/mL ENTEROCOCCUS FAECALIS  Resp Panel by RT-PCR (Flu A&B, Covid) Nasopharyngeal Swab     Status: None   Collection Time: 06/23/21  7:10 PM   Specimen: Nasopharyngeal Swab; Nasopharyngeal(NP) swabs in vial transport medium  Result Value Ref Range Status   SARS Coronavirus 2 by RT PCR NEGATIVE NEGATIVE Final    Comment: (NOTE) SARS-CoV-2 target nucleic acids are NOT DETECTED.  The SARS-CoV-2 RNA is generally detectable in upper respiratory specimens during the acute phase of infection. The lowest concentration of SARS-CoV-2 viral copies this assay can detect is 138 copies/mL. A negative result does not preclude SARS-Cov-2 infection and should not be used as the sole basis for treatment or other patient management decisions. A negative result may occur with  improper specimen collection/handling, submission of specimen other than nasopharyngeal swab, presence of viral mutation(s) within the areas targeted by this assay, and inadequate number of viral copies(<138 copies/mL). A negative result must be combined with clinical observations, patient history, and epidemiological information. The expected result is Negative.  Fact Sheet for Patients:  EntrepreneurPulse.com.au  Fact Sheet for Healthcare Providers:  IncredibleEmployment.be  This test is no t yet approved or cleared by the [place] FDA and  has been authorized for detection and/or diagnosis of SARS-CoV-2 by  FDA under an Emergency Use Authorization (EUA). This EUA will remain  in effect (meaning this test can be used) for the duration of the COVID-19 declaration under Section 564(b)(1) of the Act, 21 U.S.C.section 360bbb-3(b)(1), unless the authorization is terminated  or revoked sooner.       Influenza A by PCR NEGATIVE NEGATIVE Final   Influenza B by PCR NEGATIVE NEGATIVE Final    Comment: (NOTE) The Xpert Xpress  SARS-CoV-2/FLU/RSV plus assay is intended as an aid in the diagnosis of influenza from Nasopharyngeal swab specimens and should not be used as a sole basis for treatment. Nasal washings and aspirates are unacceptable for Xpert Xpress SARS-CoV-2/FLU/RSV testing.  Fact Sheet for Patients: EntrepreneurPulse.com.au  Fact Sheet for Healthcare Providers: IncredibleEmployment.be  This test is not yet approved or cleared by the [place] FDA and has been authorized for detection and/or diagnosis of SARS-CoV-2 by FDA under an Emergency Use Authorization (EUA). This EUA will remain in effect (meaning this test can be used) for the duration of the COVID-19 declaration under Section 564(b)(1) of the Act, 21 U.S.C. section 360bbb-3(b)(1), unless the authorization is terminated or revoked.  Performed at [facility], [place] [address] [place], [place] [address]    Time coordinating discharge: 35 minutes  SIGNED:  Kerney Elbe, DO [facility] 06/27/2021, 6:39 PM Pager is on [place]  If 7PM-7AM, please contact night-coverage [email or web address]

## 2021-06-27 NOTE — Patient Instructions (Addendum)
Visit Information  Patient Goal:  Manage Emotions; Complete ADLs daily as able, Mange depression issues  Timeframe:  Short-Term Goal Priority:  Medium Progress: On Track Start Date:            06/27/21                Expected End Date:          09/26/21            Follow Up Date 08/23/21 at 1:30 PM   Manage Emotions: Complete ADLs daily, as able . Manage depression issues   Why is this important?   When you are stressed, down or upset, your body reacts too.  For example, your blood pressure may get higher; you may have a headache or stomachache.  When your emotions get the best of you, your body's ability to fight off cold and flu gets weak.  These steps will help you manage your emotions.     Patient Coping Skills:  Takes medications as prescribed Attends scheduled medical appointments Has family support from his daughters  Patient Deficits: Mobility issues Needs occasional help in completing ADLs  Patient Goals:   Attend scheduled medical appointments in next 30 days Taked medications as prescribed in next 30 days Communicate with RNCM and [name] as needed in next 30 days for CCM support -  Follow Up Plan: [name] to call client or his daughter on 08/23/21 at 1:30 PM  to discuss needs of client at that time  [name].Abou Sterkel MSW, [name] Licensed Clinical Social Worker [facility] [phone]

## 2021-06-27 NOTE — Evaluation (Signed)
Physical Therapy Evaluation Only Patient Details Name: [name] MRN: [id number] DOB: 1933-07-28 Today's Date: 06/27/2021  History of Present Illness  [name] is a 87 y.o. male who presents to the ED for evaluation of right flank pain and blood in his urine after recent diagnosis multidrug-resistant [name] UTI.  Pt endorses multiple falls in the past including this past year. 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 Pt returns to room, completing toileting without assistance. Pt using rocking momentum and UE to power to stand, able to complete transfers with supv and cues for safety with IV line. Pt with good family support, has DME needs at home, no acute PT needs identified at this time.     Recommendations for follow up therapy are one component of a multi-disciplinary discharge planning process, led by the attending physician.  Recommendations may be updated based on patient status, additional functional criteria and insurance authorization.  Follow Up Recommendations No PT follow up    Assistance Recommended at Discharge PRN  Functional Status Assessment    Equipment Recommendations  None recommended by PT    Recommendations for Other Services       Precautions / Restrictions Precautions Precautions: Fall Precaution Comments: HOH Restrictions Weight Bearing Restrictions: No      Mobility  Bed Mobility Overal bed mobility: Independent  General bed mobility comments: return to supine of flat bed    Transfers Overall transfer level: Needs assistance Equipment used: Rolling walker (2 wheels) Transfers: Sit to/from [employer] Transfers Sit to Stand:  Supervision Stand pivot transfers: Supervision  General transfer comment: supv for safety with RW to power to stand, rocking momentum with use of BUE to power up, stand pivot EOB to [facility] with cues for IV line for safety; personal walking stick recently broke in room and unable to use    Ambulation/Gait Ambulation/Gait assistance: Supervision Gait Distance (Feet): 300 Feet Assistive device: Rolling walker (2 wheels) Gait Pattern/deviations: WFL(Within Functional Limits) Gait velocity: slightly decreased   General Gait Details: pt ambulates in hallway and around room, navigating past obstacles, completing direction turns, good steadiness without assist or cues, able to continue conversation without difficulty  Stairs            Wheelchair Mobility    Modified Rankin (Stroke Patients Only)       Balance Overall balance assessment: History of Falls;No apparent balance deficits (not formally assessed)       Pertinent Vitals/Pain Pain Assessment: No/denies pain    Home Living Family/patient expects to be discharged to:: Private residence Living Arrangements: Spouse/significant other Available Help at Discharge: Family Type of Home: House Home Access: Level entry     Alternate Level Stairs-Number of Steps: 15 stairs Home Layout: Two level;Laundry or work area in [employer]: [place];Shower seat;Grab bars - toilet;Rolling Walker (2 wheels);BSC;Rollator (4 wheels);Transport chair Additional Comments: Has about 12 walking sticks.    Prior Function Prior Level of Function : Independent/Modified Independent  Mobility Comments: Most of the time uses a walking stick. Uses 2 wheeled RW by the bed as a sort of grab bar. Uses Rollator as needed. Falls often, gets up independently or with spouse's assist. ADLs Comments:  Independent with ADLs/IADLs.     Hand Dominance   Dominant Hand: Right    Extremity/Trunk Assessment   Upper Extremity  Assessment Upper Extremity Assessment: Defer to OT evaluation    Lower Extremity Assessment Lower Extremity Assessment: Overall WFL for tasks assessed    Cervical / Trunk Assessment Cervical / Trunk Assessment: Normal  Communication   Communication: HOH  Cognition Arousal/Alertness: Awake/alert Behavior During Therapy: WFL for tasks assessed/performed Overall Cognitive Status: Within Functional Limits for tasks assessed       General Comments      Exercises     Assessment/Plan    PT Assessment Patient does not need any further PT services  PT Problem List         PT Treatment Interventions      PT Goals (Current goals can be found in the Care Plan section)  Acute Rehab PT Goals Patient Stated Goal: "I want to go home today" PT Goal Formulation: All assessment and education complete, DC therapy    Frequency     Barriers to discharge        Co-evaluation               AM-PAC PT "6 Clicks" Mobility  Outcome Measure Help needed turning from your back to your side while in a flat bed without using bedrails?: None Help needed moving from lying on your back to sitting on the side of a flat bed without using bedrails?: None Help needed moving to and from a bed to a chair (including a wheelchair)?: None Help needed standing up from a chair using your arms (e.g., wheelchair or bedside chair)?: A Little Help needed to walk in hospital room?: A Little Help needed climbing 3-5 steps with a railing? : A Little 6 Click Score: 21    End of Session Equipment Utilized During Treatment: Gait belt Activity Tolerance: Patient tolerated treatment well Patient left: in bed;with call bell/phone within reach Nurse Communication: Mobility status PT Visit Diagnosis: Other abnormalities of gait and mobility (R26.89)    Time: 1791-5056 PT Time Calculation (min) (ACUTE ONLY): 33 min   Charges:   PT Evaluation $PT Eval Low Complexity: 1 Low PT Treatments $Therapeutic  Activity: 8-22 mins         Tori Makenly [place] PT, DPT 06/27/21, 1:27 PM

## 2021-06-27 NOTE — Progress Notes (Signed)
Letter printed and mailed as well.
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
## 2021-06-27 NOTE — Evaluation (Signed)
Occupational Therapy Evaluation Patient Details Name: [name] MRN: [id number] DOB: 1932/08/29 Today's Date: 06/27/2021   History of Present Illness [name] is a 87 y.o. 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  Pt endorses multiple falls in the past including this past year.   Clinical Impression   Patient evaluated by Occupational Therapy with no further acute OT needs identified. All education has been completed and the patient has no further questions.  See below for any follow-up Occupational Therapy or equipment needs. OT is signing off. Thank you for this referral.      Recommendations for follow up therapy are one component of a multi-disciplinary discharge planning process, led by the attending physician.  Recommendations may be updated based on patient status, additional functional criteria and insurance authorization.   Follow Up Recommendations  No OT follow up    Assistance Recommended at Discharge Intermittent Supervision/Assistance  Functional Status Assessment  Patient has had a recent decline in their functional status and/or demonstrates limited ability to make significant improvements in function in a reasonable and predictable amount of time  Equipment Recommendations  None recommended by OT    Recommendations for Other Services PT consult     Precautions / Restrictions Precautions Precautions: Fall Restrictions Weight Bearing Restrictions: No      Mobility Bed Mobility Overal bed mobility: Independent             General bed mobility comments: Flat bed, no use of rails.    Transfers Overall transfer level:  Needs assistance   Transfers: Sit to/from Stand Sit to Stand: Min guard           General transfer comment: Min guard without his walking stick. Once using walking stick pt able to stand with supervision.  Pt ambulated in room with his walking stick to bathroom, stood at sink for hand hygiene, ambulated to EOB and sat without need of assistance.      Balance Overall balance assessment: History of Falls;Mild deficits observed, not formally tested                                         ADL either performed or assessed with clinical judgement   ADL Overall ADL's : At baseline                                             Vision Baseline Vision/History: 1 Wears glasses Ability to See in Adequate Light: 0 Adequate Patient Visual Report: No change from baseline Vision Assessment?: No apparent visual deficits     Perception Perception Perception: Within Functional Limits   Praxis Praxis Praxis: Intact    Pertinent Vitals/Pain Pain Assessment: No/denies pain     Hand Dominance Right   Extremity/Trunk Assessment Upper Extremity Assessment Upper Extremity Assessment: Overall WFL for tasks assessed   Lower Extremity Assessment Lower Extremity Assessment: Defer to PT evaluation       Communication Communication Communication: [facility]   Cognition Arousal/Alertness:  Awake/alert Behavior During Therapy: WFL for tasks assessed/performed Overall Cognitive Status: Within Functional Limits for tasks assessed                                       General Comments       Exercises     Shoulder Instructions      Home Living Family/patient expects to be discharged to:: Private residence Living Arrangements: Spouse/significant other Available Help at Discharge: Family Type of Home: House Home Access: Level entry     Home Layout: Two level;Laundry or work area in Building surveyor of Steps: 15  stairs Alternate Level Stairs-Rails: Can reach both Bathroom Shower/Tub: Occupational psychologist: Standard (pushes from cabinet)     Home Equipment: Grab bars - tub/shower;Shower seat;Grab bars - toilet;Rolling Walker (2 wheels);BSC;Rollator (4 wheels);Transport chair   Additional Comments: Has about 12 walking sticks.      Prior Functioning/Environment Prior Level of Function : Independent/Modified Independent             Mobility Comments: Most of the time uses a walking stick. Uses 2 wheeled RW by the bed as a sort of grab bar. Uses Rollator as needed.          OT Problem List: Impaired balance (sitting and/or standing)      OT Treatment/Interventions:      OT Goals(Current goals can be found in the care plan section) Acute Rehab OT Goals Patient Stated Goal: To go home by tomorrow as it is pt's wedding anniversary. OT Goal Formulation: All assessment and education complete, DC therapy Potential to Achieve Goals: Good  OT Frequency:     Barriers to D/C:            Co-evaluation              AM-PAC OT "6 Clicks" Daily Activity     Outcome Measure Help from another person eating meals?: None Help from another person taking care of personal grooming?: None Help from another person toileting, which includes using toliet, bedpan, or urinal?: A Little Help from another person bathing (including washing, rinsing, drying)?: A Little Help from another person to put on and taking off regular upper body clothing?: None Help from another person to put on and taking off regular lower body clothing?: None 6 Click Score: 22   End of Session Equipment Utilized During Treatment: Gait belt;Other (comment) (pt's own walking stick) Nurse Communication: Mobility status (Pt sitting EOB for breakfast. h/o falls at home.)  Activity Tolerance: Patient tolerated treatment well Patient left: with bed alarm set;with call bell/phone within reach (EOB)  OT Visit  Diagnosis: Repeated falls (R29.6);History of falling (Z91.81)                Time: 0109-3235 OT Time Calculation (min): 28 min Charges:  OT General Charges $OT Visit: 1 Visit OT Evaluation $OT Eval Low Complexity: [place], [place] Office: [phone] 06/27/2021  Julien Girt 06/27/2021, 9:18 AM

## 2021-06-27 NOTE — Chronic Care Management (AMB) (Signed)
Chronic Care Management    Clinical Social Work Note  06/27/2021 Name: [name] MRN: [id number] DOB: [birth year]  [name] is a 87 y.o. year old male who is a primary care patient of [name], DO. The CCM team was consulted to assist the patient with chronic disease management and/or care coordination needs related to: [name] .   Engaged with patient /daughter of patient, [name], by telephone for follow up visit in response to provider referral for social work chronic care management and care coordination services.   Consent to Services:  The patient was given information about Chronic Care Management services, agreed to services, and gave verbal consent prior to initiation of services.  Please see initial visit note for detailed documentation.   Patient agreed to services and consent obtained.   Assessment: Review of patient past medical history, allergies, medications, and health status, including review of relevant consultants reports was performed today as part of a comprehensive evaluation and provision of chronic care management and care coordination services.     SDOH (Social Determinants of Health) assessments and interventions performed:  SDOH Interventions    Flowsheet Row Most Recent Value  SDOH Interventions   Physical Activity Interventions Other (Comments)  [client has walking challenges. He uses a cane to help him walk]  Stress Interventions Provide Counseling  [client has stress related to managing his medical needs]  Depression Interventions/Treatment  Counseling        Advanced Directives Status: See Vynca application for related entries.  CCM Care Plan  Allergies  Allergen Reactions   Penicillins Other (See Comments)    Unknown   Tramadol Other (See Comments)    Dizzy   Ketorolac Tromethamine Rash    Facility-Administered Encounter Medications as of 06/27/2021  Medication   0.9 %  sodium chloride infusion    acetaminophen (TYLENOL) tablet 650 mg   Or   acetaminophen (TYLENOL) suppository 650 mg   albuterol (PROVENTIL) (2.5 MG/3ML) 0.083% nebulizer solution 2.5 mg   amiodarone (PACERONE) tablet 200 mg   apixaban (ELIQUIS) tablet 5 mg   Chlorhexidine Gluconate Cloth 2 % PADS 6 each   insulin aspart (novoLOG) injection 0-9 Units   ketoconazole (NIZORAL) 2 % cream   meropenem (MERREM) 1 g in sodium chloride 0.9 % 100 mL IVPB   mometasone-formoterol (DULERA) 200-5 MCG/ACT inhaler 2 puff   ondansetron (ZOFRAN) tablet 4 mg   Or   ondansetron (ZOFRAN) injection 4 mg   oxyCODONE (Oxy IR/ROXICODONE) immediate release tablet 5 mg   potassium & sodium phosphates (PHOS-NAK) 280-160-250 MG packet 1 packet   rosuvastatin (CRESTOR) tablet 40 mg   senna-docusate (Senokot-S) tablet 1 tablet   sodium chloride flush (NS) 0.9 % injection 3 mL   Outpatient Encounter Medications as of 06/27/2021  Medication Sig   acetaminophen (TYLENOL) 500 MG tablet Take 500-1,000 mg by mouth every 6 (six) hours as needed for moderate pain.   albuterol (PROVENTIL HFA;VENTOLIN HFA) 108 (90 Base) MCG/ACT inhaler Inhale 2 puffs into the lungs every 6 (six) hours as needed for wheezing or shortness of breath.   amiodarone (PACERONE) 200 MG tablet TAKE 1 TABLET BY MOUTH  DAILY (Patient taking differently: Take 200 mg by mouth daily.)   apixaban (ELIQUIS) 5 MG TABS tablet Take 1 tablet (5 mg total) by mouth 2 (two) times daily.   BESIVANCE 0.6 % SUSP Place 1 drop into both eyes daily as needed (dry eyes).   budesonide-formoterol (SYMBICORT) 160-4.5 MCG/ACT inhaler Inhale  2 puffs into the lungs 2 (two) times daily.   furosemide (LASIX) 20 MG tablet Take 1 tablet (20 mg total) by mouth every other day.   glucose blood (ONETOUCH VERIO) test strip test blood sugars daily Dx E11.9   Lancets (ONETOUCH ULTRASOFT) lancets Use to check blood sugars daily   lidocaine (XYLOCAINE) 5 % ointment Apply 1 application topically as needed. (Patient not  taking: No sig reported)   meclizine (ANTIVERT) 25 MG tablet Take 1 tablet (25 mg total) by mouth 3 (three) times daily as needed for dizziness.   metFORMIN (GLUCOPHAGE) 500 MG tablet TAKE 1 TABLET BY MOUTH  TWICE DAILY WITH A MEAL (Patient taking differently: Take 500 mg by mouth 2 (two) times daily with a meal.)   metoprolol succinate (TOPROL-XL) 25 MG 24 hr tablet Take 0.5 tablets (12.5 mg total) by mouth daily. (Patient taking differently: Take 25 mg by mouth daily.)   omeprazole (PRILOSEC) 20 MG capsule TAKE 1 CAPSULE BY MOUTH  DAILY (Patient taking differently: Take 20 mg by mouth daily.)   ondansetron (ZOFRAN ODT) 4 MG disintegrating tablet Take 1 tablet (4 mg total) by mouth every 8 (eight) hours as needed for nausea or vomiting.   rosuvastatin (CRESTOR) 40 MG tablet Take 1 tablet (40 mg total) by mouth daily. To REPLACE Atorvastatin (Patient taking differently: Take 40 mg by mouth daily.)   sacubitril-valsartan (ENTRESTO) 24-26 MG Take 1 tablet by mouth 2 (two) times daily.   senna-docusate (SENOKOT-S) 8.6-50 MG tablet Take 1 tablet by mouth at bedtime as needed for mild constipation or moderate constipation. (Patient not taking: No sig reported)   spironolactone (ALDACTONE) 25 MG tablet Take 0.5 tablets (12.5 mg total) by mouth daily.   triamcinolone cream (KENALOG) 0.1 % Apply 1 application topically 2 (two) times daily. X7 days per skin rash flare on legs (Patient not taking: No sig reported)    Patient Active Problem List   Diagnosis Date Noted   Complicated UTI (urinary tract infection) 06/23/2021   HFrEF (heart failure with reduced ejection fraction) ([place]) 06/23/2021   AKI (acute kidney injury) ([place]) 06/23/2021   Urinary tract infection without hematuria 06/14/2021   Urine discoloration 06/04/2021   Dysuria 06/04/2021   COVID-19 virus RNA test result positive at limit of detection 02/01/2021   Upper respiratory infection with cough and congestion 02/01/2021   Kidney stones  07/04/2020   Hyperlipidemia associated with type 2 diabetes mellitus ([place]) 07/05/2019   Paroxysmal atrial fibrillation ([place]) 06/29/2019   Acquired thrombophilia ([place]) 06/29/2019   Second degree Mobitz II AV block 02/22/2019   B12 deficiency 12/18/2017   Status post right knee replacement 11/10/2016   S/P repair of abdominal aortic aneurysm using bifurcation graft 10/03/2016   Mixed incontinence 04/03/2015   PVC's (premature ventricular contractions) 06/05/2014   BMI 29.0-29.9,adult 94/50/3888   Diastolic dysfunction- grade 1 by echo June 2015, EF 50% 03/28/2014   OSA (obstructive sleep apnea) 12/07/2013   Sinus bradycardia- ? symptomatic 05/17/2013   Coronary artery disease involving nonautologous biological coronary bypass graft without angina pectoris    Thrombocytopenia ([facility])    S/P CABG x 3 08/21/2011   Hypertensive cardiovascular disease    Prostate cancer ([place])    Hyperlipidemia with target LDL less than 100    Diabetes ([place]) 11/28/2010    Conditions to be addressed/monitored: monitor ADLs completion of client; monitor client management of depression issues  Care Plan : General Social Work (Adult)  Updates made by Katha Cabal, [name] since 06/27/2021  12:00 AM     Problem: Coping Skills (General Plan of Care)      Goal: Coping Skills Enhanced; Complete ADLs as able; Manage depression issues   Start Date: 06/27/2021  Expected End Date: 09/26/2021  This Visit's Progress: On track  Priority: Medium  Note:   Current barriers:   Patient in need of assistance with connecting to community resources for possible help with ADLs completion Patient is unable to independently navigate community resource options without care coordination support Mobility issues Transportation needs  Clinical Goals:   [name] to communicate with client in next 30 days to assess mobility of client and in home care needs of client Client to call RNCM as needed in next 30 days for CCM nursing  support Client to communicate regularly with his daughters in next 30 days to discuss needs of client  Clinical Interventions:  Collaboration with [name], DO regarding development and update of comprehensive plan of care as evidenced by provider attestation and co-signature Discussed with [name], daughter of client, current status and needs of client. Client is currently hospitalized at [facility] in [place], [place].  He is hoping to discharge from the hospital soon and return to his home for care Discussed with Denisea the sleeping issues of client and pain issues of client Discussed with Denisea family support for client (client has support from his 4 daughters) Reviewed with Denisea client ambulation challenges. Client uses a cane to help him walk Reviewed with Denisea the mood status of client. Client gets sad occasionally but overall his mood is stable. He hopes to return home soon for wedding anniversary for himself and his spouse Encouraged client or daughter of client to call RNCM as needed in next 30 days for nursing support for client  Patient Coping Skills: Has strong family support from his 4 daughters Takes medications as prescribed Attends scheduled medical appointments  Patient Deficits: Walking challenges Some pain issues  Patient Goals: In next 30 days, client will:  Attend scheduled medical appointments Communicate regularly with daughters regarding his ongoing needs Will participate actively in physical therapy sessions for client  Follow Up Plan: [name] will call client or daughter of client on 08/23/21 at 1:30 PM to assess client needs       [name].Mayson Mcneish MSW, [name] Licensed Clinical Social Worker [facility] Care Management [phone]

## 2021-06-28 ENCOUNTER — Telehealth: Payer: Self-pay

## 2021-06-28 ENCOUNTER — Encounter: Payer: Medicare Other | Admitting: Internal Medicine

## 2021-06-28 LAB — URINE CULTURE: Culture: 20000 — AB

## 2021-06-28 NOTE — Telephone Encounter (Signed)
Review on discharge summary from ED shows that urine culture was positive for 20,000 colonies of enterococcus faecalis. Infectious disease and urology were consulted in the hospital and believe this to be a colonization rather than current infection. They recommended to discontinue antibiotics and follow up with urology outpatient in 1-2 weeks.

## 2021-06-28 NOTE — Telephone Encounter (Signed)
Please review results of Urine Culture from hospital - he was d/c yesterday, not sent home with any antibiotics and only got IV antibiotics for 2 days - He still has hematuria and pain. Was also told he cannot do oral antibiotics due to decreased kidney function. Please advise.

## 2021-06-28 NOTE — Telephone Encounter (Signed)
Transition Care Management Follow-up Telephone Call Date of discharge and from where: 06/27/21 - [facility] Diagnosis: complicated UTI How have you been since you were released from the hospital? Still having pain and hematuria Any questions or concerns? Yes - while in hospital, he was told that his renal function was low and he couldn't tolerate oral antibiotics and had to hold his heart medications for a while - they originally planned to keep him for 7 days but he was d/c after 2 days - they aren't sure what he should and shouldn't be taking, whether he needs antibiotics, etc - I sent msg to provider today to check urine culture result to let them know what to do.  Items Reviewed: Did the pt receive and understand the discharge instructions provided? No  - confused - d/c papers/instructions are different from what they were verbally informed of while admitted Medications obtained and verified? Yes  Other? No  Any new allergies since your discharge? No  Dietary orders reviewed? Yes Do you have support at home? Yes   Home Care and Equipment/Supplies: Were home health services ordered? no If so, what is the name of the agency? N/a  Has the agency set up a time to come to the patient's home? not applicable Were any new equipment or medical supplies ordered?  No What is the name of the medical supply agency? N/a Were you able to get the supplies/equipment? not applicable Do you have any questions related to the use of the equipment or supplies? No  Functional Questionnaire: (I = Independent and D = Dependent) ADLs: I  Bathing/Dressing- I  Meal Prep- I  Eating- I  Maintaining continence- I  Transferring/Ambulation- I  Managing Meds- I  Follow up appointments reviewed:  PCP Hospital f/u appt confirmed? Yes  Scheduled to see [place] on 11/11 @ 11:30. [place]  Hospital f/u appt confirmed?  Plan to call and get appt with Urology soon today or Monday Are transportation  arrangements needed? No  If their condition worsens, is the pt aware to call PCP or go to the Emergency Dept.? Yes Was the patient provided with contact information for the PCP's office or ED? Yes Was to pt encouraged to call back with questions or concerns? Yes

## 2021-06-28 NOTE — Telephone Encounter (Signed)
Pt's daughter aware of provider feedback and voiced understanding. Pt has appt with Dr Darnell Level Monday.

## 2021-07-01 ENCOUNTER — Other Ambulatory Visit: Payer: Self-pay

## 2021-07-01 ENCOUNTER — Telehealth: Payer: Self-pay | Admitting: Family Medicine

## 2021-07-01 ENCOUNTER — Ambulatory Visit (INDEPENDENT_AMBULATORY_CARE_PROVIDER_SITE_OTHER): Payer: Medicare Other | Admitting: Family Medicine

## 2021-07-01 VITALS — BP 108/57 | HR 65 | Temp 97.5°F | Ht 71.0 in | Wt 198.0 lb

## 2021-07-01 DIAGNOSIS — Z09 Encounter for follow-up examination after completed treatment for conditions other than malignant neoplasm: Secondary | ICD-10-CM

## 2021-07-01 DIAGNOSIS — N390 Urinary tract infection, site not specified: Secondary | ICD-10-CM | POA: Diagnosis not present

## 2021-07-01 DIAGNOSIS — N1832 Chronic kidney disease, stage 3b: Secondary | ICD-10-CM | POA: Diagnosis not present

## 2021-07-01 DIAGNOSIS — N179 Acute kidney failure, unspecified: Secondary | ICD-10-CM

## 2021-07-01 DIAGNOSIS — N200 Calculus of kidney: Secondary | ICD-10-CM

## 2021-07-01 LAB — MICROSCOPIC EXAMINATION
Epithelial Cells (non renal): NONE SEEN /hpf (ref 0–10)
Renal Epithel, UA: NONE SEEN /hpf

## 2021-07-01 LAB — URINALYSIS, COMPLETE
Bilirubin, UA: NEGATIVE
Glucose, UA: NEGATIVE
Leukocytes,UA: NEGATIVE
Nitrite, UA: NEGATIVE
Specific Gravity, UA: >1.03 — ABNORMAL HIGH (ref 1.005–1.030)
Urobilinogen, Ur: 4 mg/dL — ABNORMAL HIGH (ref 0.2–1.0)
pH, UA: 6 (ref 5.0–7.5)

## 2021-07-01 NOTE — Telephone Encounter (Signed)
REFERRAL REQUEST Telephone Note  Have you been seen at our office for this problem? YES (Advise that they may need an appointment with their PCP before a referral can be done)  Reason for Referral: Blood in urine, kidney stones. Wants to see Dr. Jeffie Pollock 11-9 @ 9:45 Referral discussed with patient: YES  Best contact number of patient for referral team: [phone]    Has patient been seen by a specialist for this issue before: Dr. Aline August  Patient provider preference for referral: Dr. Jeffie Pollock Patient location preference for referral: [facility]   Patient notified that referrals can take up to a week or longer to process. If they haven't heard anything within a week they should call back and speak with the referral department.
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
## 2021-07-01 NOTE — Telephone Encounter (Signed)
Does pt need a new referral or can he request the switch over.  Looks like he already has appt?

## 2021-07-02 LAB — CMP14+EGFR
ALT: 21 IU/L (ref 0–44)
AST: 24 IU/L (ref 0–40)
Albumin/Globulin Ratio: 1.7 (ref 1.2–2.2)
Albumin: 3.8 g/dL (ref 3.6–4.6)
Alkaline Phosphatase: 93 IU/L (ref 44–121)
BUN/Creatinine Ratio: 17 (ref 10–24)
BUN: 22 mg/dL (ref 8–27)
Bilirubin Total: 0.9 mg/dL (ref 0.0–1.2)
CO2: 23 mmol/L (ref 20–29)
Calcium: 9.3 mg/dL (ref 8.6–10.2)
Chloride: 105 mmol/L (ref 96–106)
Creatinine, Ser: 1.33 mg/dL — ABNORMAL HIGH (ref 0.76–1.27)
Globulin, Total: 2.3 g/dL (ref 1.5–4.5)
Glucose: 117 mg/dL — ABNORMAL HIGH (ref 70–99)
Potassium: 4.8 mmol/L (ref 3.5–5.2)
Sodium: 139 mmol/L (ref 134–144)
Total Protein: 6.1 g/dL (ref 6.0–8.5)
eGFR: 52 mL/min/{1.73_m2} — ABNORMAL LOW (ref >59)

## 2021-07-02 LAB — CBC
Hematocrit: 37.9 % (ref 37.5–51.0)
Hemoglobin: 12.7 g/dL — ABNORMAL LOW (ref 13.0–17.7)
MCH: 32.8 pg (ref 26.6–33.0)
MCHC: 33.5 g/dL (ref 31.5–35.7)
MCV: 98 fL — ABNORMAL HIGH (ref 79–97)
Platelets: 94 10*3/uL — CL (ref 150–450)
RBC: 3.87 x10E6/uL — ABNORMAL LOW (ref 4.14–5.80)
RDW: 14.5 % (ref 11.6–15.4)
WBC: 5.8 10*3/uL (ref 3.4–10.8)

## 2021-07-02 LAB — PHOSPHORUS: Phosphorus: 2.4 mg/dL — ABNORMAL LOW (ref 2.8–4.1)

## 2021-07-02 LAB — MAGNESIUM: Magnesium: 2.2 mg/dL (ref 1.6–2.3)

## 2021-07-03 ENCOUNTER — Other Ambulatory Visit: Payer: Self-pay | Admitting: Urology

## 2021-07-03 ENCOUNTER — Other Ambulatory Visit: Payer: Self-pay | Admitting: Family Medicine

## 2021-07-03 ENCOUNTER — Telehealth: Payer: Self-pay | Admitting: Cardiology

## 2021-07-03 DIAGNOSIS — N202 Calculus of kidney with calculus of ureter: Secondary | ICD-10-CM | POA: Diagnosis not present

## 2021-07-03 DIAGNOSIS — N200 Calculus of kidney: Secondary | ICD-10-CM

## 2021-07-03 DIAGNOSIS — N390 Urinary tract infection, site not specified: Secondary | ICD-10-CM

## 2021-07-03 DIAGNOSIS — R8271 Bacteriuria: Secondary | ICD-10-CM | POA: Diagnosis not present

## 2021-07-03 NOTE — Telephone Encounter (Signed)
Pharmacy, can you please comment on how long Eliquis can be held for upcoming procedure?  Thank you! 

## 2021-07-03 NOTE — Telephone Encounter (Signed)
   [facility] Pre-operative Risk Assessment    Patient Name: [name]  DOB: [birth year] MRN: [id number]  HEARTCARE STAFF:  - IMPORTANT!!!!!! Under Visit Info/Reason for Call, type in Other and utilize the format Clearance MM/DD/YY or Clearance TBD. Do not use dashes or single digits. - Please review there is not already an duplicate clearance open for this procedure. - If request is for dental extraction, please clarify the # of teeth to be extracted. - If the patient is currently at the dentist's office, call Pre-Op Callback Staff (MA/nurse) to input urgent request.  - If the patient is not currently in the dentist office, please route to the Pre-Op pool.  Request for surgical clearance:  What type of surgery is being performed? Ureteroscopy  for Kidney Stones  When is this surgery scheduled? 07-12-21  What type of clearance is required (medical clearance vs. Pharmacy clearance to hold med vs. Both)? both  Are there any medications that need to be held prior to surgery and how long? Eliquis  Practice name and name of physician performing surgery? Dr Irine Seal  What is the office phone number? [phone]   7.   What is the office fax number? [phone]  8.   Anesthesia type (None, local, MAC, general) ? general   [name] 07/03/2021, 12:05 PM  _________________________________________________________________   (provider comments below)

## 2021-07-04 NOTE — Progress Notes (Signed)
Sent message, via epic in basket, requesting orders in epic from surgeon.  

## 2021-07-04 NOTE — Telephone Encounter (Signed)
    Patient Name: [name]  DOB: Jun 28, 1933 MRN: [id number]  Primary Cardiologist: Carlyle Dolly, MD  Chart reviewed as part of pre-operative protocol coverage. The patient was doing well on cardiac stand point when seen by Dr. Harl Bowie 05/21/21. Given past medical history and time since last visit, based on ACC/AHA guidelines, KEARY WATERSON would be at acceptable risk for the planned procedure without further cardiovascular testing.   I will route this recommendation to the requesting party via Epic fax function and remove from pre-op pool.  Please call with questions.  [place], [place] 07/04/2021, 4:17 PM

## 2021-07-04 NOTE — Telephone Encounter (Signed)
Patient with diagnosis of afib on Eliquis for anticoagulation.    Procedure: Ureteroscopy for Kidney Stones Date of procedure: 07/12/21  CHA2DS2-VASc Score = 8   This indicates a 10.8% annual risk of stroke. The patient's score is based upon: CHF History: 1 HTN History: 1 Diabetes History: 1 Stroke History: 2 Vascular Disease History: 1 Age Score: 2 Gender Score: 0    CrCl 42.46 ml/min  Patient had a CVA seen on MRI in 2008. ACC and CHEST guidelines do not recommend parenteral bridging for periprocedural interruption of DOAC therapy.  Per office protocol, patient can hold Eliquis for 2 days prior to procedure.

## 2021-07-05 ENCOUNTER — Inpatient Hospital Stay: Payer: Medicare Other | Admitting: Family Medicine

## 2021-07-05 LAB — URINE CULTURE

## 2021-07-05 NOTE — Progress Notes (Addendum)
COVID swab appointment: n/a  COVID Vaccine Completed:  yes x3  Date COVID Vaccine completed: 09/17/19, 10/08/19 Has received booster:06/27/20 COVID vaccine manufacturer: Pfizer      Date of COVID positive in last 90 days: no  PCP - Ronnie Doss, DO Cardiologist - Carlyle Dolly, MD Electrophysiologist- Thompson Grayer, MD  Cardiac Clearance 07/03/21 by Dr. Harl Bowie in [place]  Chest x-ray - 05/13/21 Epic EKG - 05/13/21 Epic Stress Test - 04/16/20 Epic ECHO - 04/25/20 Epic Cardiac Cath - 2015 Pacemaker/ICD device last checked: 05/21/21 Epic Spinal Cord Stimulator: n/a  Sleep Study - yes positive CPAP - doesn't use  Fasting Blood Sugar - 120-140 Checks Blood Sugar _1_ times a day  Blood Thinner Instructions: Eliquis, hold 2 days Aspirin Instructions: Last Dose:  Activity level: Can go up a flight of stairs and perform activities of daily living without stopping and without symptoms of chest pain. SOB with exertion, not new.      Anesthesia review: HTN, CAD, PVCs, A fib, OSA, CABG, DM 2, AAA, CVA, COPD, LBBB  Patient denies shortness of breath, fever, cough and chest pain at PAT appointment   Patient verbalized understanding of instructions that were given to them at the PAT appointment. Patient was also instructed that they will need to review over the PAT instructions again at home before surgery.

## 2021-07-08 ENCOUNTER — Encounter: Payer: Self-pay | Admitting: Internal Medicine

## 2021-07-08 ENCOUNTER — Encounter (HOSPITAL_COMMUNITY)
Admission: RE | Admit: 2021-07-08 | Discharge: 2021-07-08 | Disposition: A | Discharge status: Home / Self Care | Payer: Medicare Other | Source: Ambulatory Visit | Attending: Urology | Admitting: Urology

## 2021-07-08 VITALS — BP 125/59 | HR 60 | Temp 97.7°F | Resp 16 | Ht 71.0 in | Wt 193.5 lb

## 2021-07-08 DIAGNOSIS — Z951 Presence of aortocoronary bypass graft: Secondary | ICD-10-CM | POA: Diagnosis not present

## 2021-07-08 DIAGNOSIS — I480 Paroxysmal atrial fibrillation: Secondary | ICD-10-CM | POA: Diagnosis not present

## 2021-07-08 DIAGNOSIS — G4733 Obstructive sleep apnea (adult) (pediatric): Secondary | ICD-10-CM | POA: Diagnosis not present

## 2021-07-08 DIAGNOSIS — I2510 Atherosclerotic heart disease of native coronary artery without angina pectoris: Secondary | ICD-10-CM | POA: Insufficient documentation

## 2021-07-08 DIAGNOSIS — N200 Calculus of kidney: Secondary | ICD-10-CM | POA: Diagnosis not present

## 2021-07-08 DIAGNOSIS — Z950 Presence of cardiac pacemaker: Secondary | ICD-10-CM | POA: Insufficient documentation

## 2021-07-08 DIAGNOSIS — Z7901 Long term (current) use of anticoagulants: Secondary | ICD-10-CM | POA: Diagnosis not present

## 2021-07-08 DIAGNOSIS — Z8546 Personal history of malignant neoplasm of prostate: Secondary | ICD-10-CM | POA: Insufficient documentation

## 2021-07-08 DIAGNOSIS — E119 Type 2 diabetes mellitus without complications: Secondary | ICD-10-CM | POA: Insufficient documentation

## 2021-07-08 DIAGNOSIS — J449 Chronic obstructive pulmonary disease, unspecified: Secondary | ICD-10-CM | POA: Diagnosis not present

## 2021-07-08 DIAGNOSIS — Z01812 Encounter for preprocedural laboratory examination: Secondary | ICD-10-CM | POA: Insufficient documentation

## 2021-07-08 DIAGNOSIS — Z8673 Personal history of transient ischemic attack (TIA), and cerebral infarction without residual deficits: Secondary | ICD-10-CM | POA: Diagnosis not present

## 2021-07-08 LAB — BASIC METABOLIC PANEL
Anion gap: 7 (ref 5–15)
BUN: 19 mg/dL (ref 8–23)
CO2: 25 mmol/L (ref 22–32)
Calcium: 9.5 mg/dL (ref 8.9–10.3)
Chloride: 106 mmol/L (ref 98–111)
Creatinine, Ser: 1.38 mg/dL — ABNORMAL HIGH (ref 0.61–1.24)
GFR, Estimated: 50 mL/min — ABNORMAL LOW (ref >60)
Glucose, Bld: 126 mg/dL — ABNORMAL HIGH (ref 70–99)
Potassium: 4.7 mmol/L (ref 3.5–5.1)
Sodium: 138 mmol/L (ref 135–145)

## 2021-07-08 LAB — CBC
HCT: 38.1 % — ABNORMAL LOW (ref 39.0–52.0)
Hemoglobin: 12.6 g/dL — ABNORMAL LOW (ref 13.0–17.0)
MCH: 33.1 pg (ref 26.0–34.0)
MCHC: 33.1 g/dL (ref 30.0–36.0)
MCV: 100 fL (ref 80.0–100.0)
Platelets: 104 10*3/uL — ABNORMAL LOW (ref 150–400)
RBC: 3.81 MIL/uL — ABNORMAL LOW (ref 4.22–5.81)
RDW: 15.1 % (ref 11.5–15.5)
WBC: 6.5 10*3/uL (ref 4.0–10.5)
nRBC: 0 % (ref 0.0–0.2)

## 2021-07-08 LAB — GLUCOSE, CAPILLARY: Glucose-Capillary: 97 mg/dL (ref 70–99)

## 2021-07-08 NOTE — Patient Instructions (Signed)
DUE TO COVID-19 ONLY ONE VISITOR IS ALLOWED TO COME WITH YOU AND STAY IN THE WAITING ROOM ONLY DURING PRE OP AND PROCEDURE.   **NO VISITORS ARE ALLOWED IN THE SHORT STAY AREA OR RECOVERY ROOM!!**       Your procedure is scheduled on: 07/12/21   Report to [facility] Main Entrance    Report to admitting at 8:15 AM   Call this number if you have problems the morning of surgery [phone]   Do not eat food :After Midnight.   May have liquids until 7:30 AM day of surgery  CLEAR LIQUID DIET  Foods Allowed                                                                     Foods Excluded  Water, Black Coffee and tea (no milk or creamer)           liquids that you cannot  Plain Jell-O in any flavor  (No red)                                    see through such as: Fruit ices (not with fruit pulp)                                           milk, soups, orange juice              Iced Popsicles (No red)                                               All solid food                                   Apple juices  Sports drinks like Gatorade (No red) Lightly seasoned clear broth or consume(fat free) Sugar  Oral Hygiene is also important to reduce your risk of infection.                                    Remember - BRUSH YOUR TEETH THE MORNING OF SURGERY WITH YOUR REGULAR TOOTHPASTE   Take these medicines the morning of surgery with A SIP OF WATER: Tylenol, Inhalers, Amiodarone, Metoprolol, Omeprazole, Crestor.   DO NOT TAKE ANY ORAL DIABETIC MEDICATIONS DAY OF YOUR SURGERY  How to Manage Your Diabetes Before and After Surgery  Why is it important to control my blood sugar before and after surgery? Improving blood sugar levels before and after surgery helps healing and can limit problems. A way of improving blood sugar control is eating a healthy diet by:  Eating less sugar and carbohydrates  Increasing activity/exercise  Talking with your doctor about reaching your blood sugar  goals High blood sugars (greater than 180 mg/dL) can raise your risk of infections and slow your  recovery, so you will need to focus on controlling your diabetes during the weeks before surgery. Make sure that the doctor who takes care of your diabetes knows about your planned surgery including the date and location.  How do I manage my blood sugar before surgery? Check your blood sugar at least 4 times a day, starting 2 days before surgery, to make sure that the level is not too high or low. Check your blood sugar the morning of your surgery when you wake up and every 2 hours until you get to the Short Stay unit. If your blood sugar is less than 70 mg/dL, you will need to treat for low blood sugar: Do not take insulin. Treat a low blood sugar (less than 70 mg/dL) with  cup of clear juice (cranberry or apple), 4 glucose tablets, OR glucose gel. Recheck blood sugar in 15 minutes after treatment (to make sure it is greater than 70 mg/dL). If your blood sugar is not greater than 70 mg/dL on recheck, call [phone] for further instructions. Report your blood sugar to the short stay nurse when you get to Short Stay.  If you are admitted to the hospital after surgery: Your blood sugar will be checked by the staff and you will probably be given insulin after surgery (instead of oral diabetes medicines) to make sure you have good blood sugar levels. The goal for blood sugar control after surgery is 80-180 mg/dL.   WHAT DO I DO ABOUT MY DIABETES MEDICATION?  Do not take oral diabetes medicines (pills) the morning of surgery.  THE DAY BEFORE SURGERY, take  Metformin as prescribed.       THE MORNING OF SURGERY, do not take Metformin.   The day of surgery, do not take other diabetes injectables, including Byetta (exenatide), Bydureon (exenatide ER), Victoza (liraglutide), or Trulicity (dulaglutide).  Reviewed and Endorsed by [facility] Patient Education Committee, August 2015                                You may not have any metal on your body including hair pins, jewelry, and body piercing             Do not wear make-up, lotions, powders, perfumes/cologne, or deodorant              Men may shave face and neck.   Do not bring valuables to the hospital. [place].   Contacts, dentures or bridgework may not be worn into surgery.   Patients discharged on the day of surgery will not be allowed to drive home.              Please read over the following fact sheets you were given: IF YOU HAVE QUESTIONS ABOUT YOUR PRE-OP INSTRUCTIONS PLEASE CALL [place] - Preparing for Surgery Before surgery, you can play an important role.  Because skin is not sterile, your skin needs to be as free of germs as possible.  You can reduce the number of germs on your skin by washing with CHG (chlorahexidine gluconate) soap before surgery.  CHG is an antiseptic cleaner which kills germs and bonds with the skin to continue killing germs even after washing. Please DO NOT use if you have an allergy to CHG or antibacterial soaps.  If your skin  becomes reddened/irritated stop using the CHG and inform your nurse when you arrive at Short Stay. Do not shave (including legs and underarms) for at least 48 hours prior to the first CHG shower.  You may shave your face/neck.  Please follow these instructions carefully:  1.  Shower with CHG Soap the night before surgery and the  morning of surgery.  2.  If you choose to wash your hair, wash your hair first as usual with your normal  shampoo.  3.  After you shampoo, rinse your hair and body thoroughly to remove the shampoo.                             4.  Use CHG as you would any other liquid soap.  You can apply chg directly to the skin and wash.  Gently with a scrungie or clean washcloth.  5.  Apply the CHG Soap to your body ONLY FROM THE NECK DOWN.   Do   not use on face/ open                            Wound or open sores. Avoid contact with eyes, ears mouth and   genitals (private parts).                       Wash face,  Genitals (private parts) with your normal soap.             6.  Wash thoroughly, paying special attention to the area where your    surgery  will be performed.  7.  Thoroughly rinse your body with warm water from the neck down.  8.  DO NOT shower/wash with your normal soap after using and rinsing off the CHG Soap.                9.  Pat yourself dry with a clean towel.            10.  Wear clean pajamas.            11.  Place clean sheets on your bed the night of your first shower and do not  sleep with pets. Day of Surgery : Do not apply any lotions/deodorants the morning of surgery.  Please wear clean clothes to the hospital/surgery center.  FAILURE TO FOLLOW THESE INSTRUCTIONS MAY RESULT IN THE CANCELLATION OF YOUR SURGERY  PATIENT SIGNATURE_________________________________  NURSE SIGNATURE__________________________________  ________________________________________________________________________

## 2021-07-08 NOTE — Progress Notes (Signed)
PERIOPERATIVE PRESCRIPTION FOR IMPLANTED CARDIAC DEVICE PROGRAMMING  Patient Information: Name:  [name]  DOB:  03/12/1933  MRN:  [id number]    Planned Procedure:  cystoscopy with bilateral retrograde, stent  Surgeon:  Dr. Irine Seal  Date of Procedure:  07/12/2021  Cautery will be used.  Position during surgery:  unknown   Please send documentation back to:  [facility] (Fax # [phone])  Device Information:  Clinic EP Physician:  Thompson Grayer, MD   Device Type:  Pacemaker Manufacturer and Phone #:  [employer]: [phone] Pacemaker Dependent?:  Yes.   Date of Last Device Check:  05/21/21 Normal Device Function?:  Yes.    Electrophysiologist's Recommendations:  Have magnet available. Provide continuous ECG monitoring when magnet is used or reprogramming is to be performed.  Procedure may interfere with device function.  Magnet should be placed over device during procedure. If patient is prone in position, industry will need to come have device reprogrammed.   Per Device Clinic Standing Orders, Simone Curia, RN  12:38 PM 07/08/2021

## 2021-07-09 NOTE — Anesthesia Preprocedure Evaluation (Addendum)
Anesthesia Evaluation  Patient identified by MRN, date of birth, ID band Patient awake    Reviewed: Allergy & Precautions, NPO status , Patient's Chart, lab work & pertinent test results  History of Anesthesia Complications Negative for: history of anesthetic complications  Airway Mallampati: I  TM Distance: >3 FB Neck ROM: Full    Dental  (+) Edentulous Upper, Edentulous Lower   Pulmonary sleep apnea and Continuous Positive Airway Pressure Ventilation , COPD, former smoker,    breath sounds clear to auscultation       Cardiovascular hypertension, Pt. on medications and Pt. on home beta blockers (-) angina+ CAD, + CABG, + Peripheral Vascular Disease (s/p AAA repair) and +CHF (entresto)  + dysrhythmias Atrial Fibrillation + pacemaker  Rhythm:Irregular Rate:Normal  04/2020 TTE 1. Left ventricular ejection fraction, by estimation, is approximately  35%. The left ventricle has moderately decreased function. The left  ventricle demonstrates regional wall motion abnormalities (see scoring  diagram/findings for description). Septal  motion suggestive of RV pacing. Left ventricular diastolic parameters are  indeterminate.  2. RV-RA gradient normal at 20 mmHg. Right ventricular systolic function  is normal. The right ventricular size is normal. Device wire present.  3. Left atrial size was upper normal.  4. The mitral valve is grossly normal. Trivial mitral valve  regurgitation.  5. The aortic valve is tricuspid, moderately calcified. Aortic valve  regurgitation is mild. Mild aortic valve stenosis. Aortic valve area, by  VTI measures 1.45 cm. Aortic valve mean gradient measures 11.2 mmHg.  6. Aortic dilatation noted. There is mild dilatation of the aortic root.  7. Unable to estimate CVP.  03/2020 ECHO: There was no ST segment deviation noted during stress. Findings consistent with prior inferior myocardial infarction with mild  peri-infarct ischemia. This is a high risk study. Risk based on decreased LVEF, there is only mild amount of myocardium currently at jeopardy. Recommend correlating LVEF with echo. EF is severely decreased (<30%).   Neuro/Psych CVA, No Residual Symptoms negative neurological ROS     GI/Hepatic Neg liver ROS, GERD  Medicated and Controlled,  Endo/Other  diabetes (glu 99), Type 2, Oral Hypoglycemic Agents  Renal/GU Renal InsufficiencyRenal diseaseStones  Lab Results      Component                Value               Date                      CREATININE               1.38 (H)            07/08/2021                BUN                      19                  07/08/2021                NA                       138                 07/08/2021                K  4.7                 07/08/2021                CL                       106                 07/08/2021                CO2                      25                  07/08/2021                Musculoskeletal  (+) Arthritis ,   Abdominal (+) + obese,   Peds  Hematology Eliquis Lab Results      Component                Value               Date                      WBC                      6.5                 07/08/2021                HGB                      12.6 (L)            07/08/2021                HCT                      38.1 (L)            07/08/2021                MCV                      100.0               07/08/2021                PLT                      104 (L)             07/08/2021              Anesthesia Other Findings ALL: Tromethene, Pcn Tramadol, Ketorolac  Reproductive/Obstetrics                          Anesthesia Physical Anesthesia Plan  ASA: 4  Anesthesia Plan: General   Post-op Pain Management:    Induction: Intravenous  PONV Risk Score and Plan: 3 and Treatment may vary due to age or medical condition and Ondansetron  Airway Management Planned: Oral  ETT  Additional Equipment: None  Intra-op Plan:   Post-operative Plan: Extubation in OR  Informed Consent: I have reviewed the patients History and Physical, chart, labs and discussed the procedure including the risks, benefits and  alternatives for the proposed anesthesia with the patient or authorized representative who has indicated his/her understanding and acceptance.       Plan Discussed with: CRNA and Surgeon  Anesthesia Plan Comments: (See PAT note 07/08/2021, Konrad Felix Ward, PA-C)      Anesthesia Quick Evaluation
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
## 2021-07-09 NOTE — Progress Notes (Signed)
CBC done 07/08/2021 routed to Dr Jeffie Pollock.
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
## 2021-07-11 ENCOUNTER — Encounter: Payer: Self-pay | Admitting: Family Medicine

## 2021-07-11 ENCOUNTER — Ambulatory Visit: Payer: Medicare Other | Admitting: Family Medicine

## 2021-07-11 VITALS — BP 100/60 | HR 60 | Ht 71.0 in | Wt 196.4 lb

## 2021-07-11 DIAGNOSIS — I5022 Chronic systolic (congestive) heart failure: Secondary | ICD-10-CM

## 2021-07-11 DIAGNOSIS — I2510 Atherosclerotic heart disease of native coronary artery without angina pectoris: Secondary | ICD-10-CM

## 2021-07-11 DIAGNOSIS — I4891 Unspecified atrial fibrillation: Secondary | ICD-10-CM

## 2021-07-11 NOTE — Patient Instructions (Addendum)

## 2021-07-12 ENCOUNTER — Ambulatory Visit (HOSPITAL_COMMUNITY): Payer: Medicare Other | Admitting: Anesthesiology

## 2021-07-12 ENCOUNTER — Encounter (HOSPITAL_COMMUNITY): Payer: Self-pay | Admitting: Urology

## 2021-07-12 ENCOUNTER — Ambulatory Visit (HOSPITAL_COMMUNITY): Payer: Medicare Other | Admitting: Physician Assistant

## 2021-07-12 ENCOUNTER — Encounter (HOSPITAL_COMMUNITY)
Admission: RE | Disposition: A | Discharge status: Home / Self Care | Payer: Self-pay | Source: Ambulatory Visit | Attending: Urology

## 2021-07-12 ENCOUNTER — Other Ambulatory Visit: Payer: Self-pay

## 2021-07-12 ENCOUNTER — Ambulatory Visit (HOSPITAL_COMMUNITY): Payer: Medicare Other

## 2021-07-12 ENCOUNTER — Ambulatory Visit (HOSPITAL_COMMUNITY)
Admission: RE | Admit: 2021-07-12 | Discharge: 2021-07-12 | Disposition: A | Discharge status: Home / Self Care | Payer: Medicare Other | Source: Ambulatory Visit | Attending: Urology | Admitting: Urology

## 2021-07-12 DIAGNOSIS — Z7984 Long term (current) use of oral hypoglycemic drugs: Secondary | ICD-10-CM | POA: Insufficient documentation

## 2021-07-12 DIAGNOSIS — Z6827 Body mass index (BMI) 27.0-27.9, adult: Secondary | ICD-10-CM | POA: Insufficient documentation

## 2021-07-12 DIAGNOSIS — E1151 Type 2 diabetes mellitus with diabetic peripheral angiopathy without gangrene: Secondary | ICD-10-CM | POA: Diagnosis not present

## 2021-07-12 DIAGNOSIS — N202 Calculus of kidney with calculus of ureter: Secondary | ICD-10-CM | POA: Insufficient documentation

## 2021-07-12 DIAGNOSIS — I480 Paroxysmal atrial fibrillation: Secondary | ICD-10-CM

## 2021-07-12 DIAGNOSIS — Z87440 Personal history of urinary (tract) infections: Secondary | ICD-10-CM | POA: Diagnosis not present

## 2021-07-12 DIAGNOSIS — E669 Obesity, unspecified: Secondary | ICD-10-CM | POA: Insufficient documentation

## 2021-07-12 DIAGNOSIS — I110 Hypertensive heart disease with heart failure: Secondary | ICD-10-CM | POA: Insufficient documentation

## 2021-07-12 DIAGNOSIS — I509 Heart failure, unspecified: Secondary | ICD-10-CM | POA: Insufficient documentation

## 2021-07-12 DIAGNOSIS — N3031 Trigonitis with hematuria: Secondary | ICD-10-CM | POA: Insufficient documentation

## 2021-07-12 DIAGNOSIS — N200 Calculus of kidney: Secondary | ICD-10-CM | POA: Diagnosis not present

## 2021-07-12 DIAGNOSIS — Z87891 Personal history of nicotine dependence: Secondary | ICD-10-CM | POA: Insufficient documentation

## 2021-07-12 DIAGNOSIS — N3030 Trigonitis without hematuria: Secondary | ICD-10-CM | POA: Diagnosis not present

## 2021-07-12 DIAGNOSIS — N390 Urinary tract infection, site not specified: Secondary | ICD-10-CM | POA: Diagnosis not present

## 2021-07-12 DIAGNOSIS — N179 Acute kidney failure, unspecified: Secondary | ICD-10-CM | POA: Diagnosis not present

## 2021-07-12 DIAGNOSIS — N3080 Other cystitis without hematuria: Secondary | ICD-10-CM | POA: Diagnosis not present

## 2021-07-12 DIAGNOSIS — I351 Nonrheumatic aortic (valve) insufficiency: Secondary | ICD-10-CM | POA: Diagnosis not present

## 2021-07-12 HISTORY — PX: CYSTOSCOPY WITH RETROGRADE PYELOGRAM, URETEROSCOPY AND STENT PLACEMENT: SHX5789

## 2021-07-12 LAB — PROTIME-INR
INR: 1.1 (ref 0.8–1.2)
Prothrombin Time: 14.5 seconds (ref 11.4–15.2)

## 2021-07-12 LAB — GLUCOSE, CAPILLARY
Glucose-Capillary: 99 mg/dL (ref 70–99)
Glucose-Capillary: 148 mg/dL — ABNORMAL HIGH (ref 70–99)

## 2021-07-12 LAB — APTT: aPTT: 28 seconds (ref 24–36)

## 2021-07-12 SURGERY — CYSTOURETEROSCOPY, WITH RETROGRADE PYELOGRAM AND STENT INSERTION
Anesthesia: General | Laterality: Bilateral

## 2021-07-12 MED ORDER — GENTAMICIN SULFATE 40 MG/ML IJ SOLN
440.0000 mg | INTRAVENOUS | Status: AC
  Administered 2021-07-12: 440 mg via INTRAVENOUS
  Filled 2021-07-12: qty 11

## 2021-07-12 MED ORDER — LACTATED RINGERS IV SOLN: INTRAVENOUS | Status: DC

## 2021-07-12 MED ORDER — CHLORHEXIDINE GLUCONATE 0.12 % MT SOLN
15.0000 mL | Freq: Once | OROMUCOSAL | Status: AC
  Administered 2021-07-12: 15 mL via OROMUCOSAL

## 2021-07-12 MED ORDER — IOHEXOL 300 MG/ML  SOLN
INTRAMUSCULAR | Status: DC | PRN
  Administered 2021-07-12: 1 mL

## 2021-07-12 MED ORDER — PHENYLEPHRINE 40 MCG/ML (10ML) SYRINGE FOR IV PUSH (FOR BLOOD PRESSURE SUPPORT)
PREFILLED_SYRINGE | INTRAVENOUS | Status: AC
  Filled 2021-07-12: qty 10

## 2021-07-12 MED ORDER — SUGAMMADEX SODIUM 200 MG/2ML IV SOLN
INTRAVENOUS | Status: DC | PRN
  Administered 2021-07-12: 200 mg via INTRAVENOUS

## 2021-07-12 MED ORDER — STERILE WATER FOR IRRIGATION IR SOLN
Status: DC | PRN
  Administered 2021-07-12: 3000 mL

## 2021-07-12 MED ORDER — SODIUM CHLORIDE 0.9 % IR SOLN
Status: DC | PRN
  Administered 2021-07-12: 3000 mL via INTRAVESICAL

## 2021-07-12 MED ORDER — LIDOCAINE 2% (20 MG/ML) 5 ML SYRINGE
INTRAMUSCULAR | Status: DC | PRN
  Administered 2021-07-12: 50 mg via INTRAVENOUS

## 2021-07-12 MED ORDER — SULFAMETHOXAZOLE-TRIMETHOPRIM 800-160 MG PO TABS: 0.5000 | ORAL_TABLET | Freq: Two times a day (BID) | ORAL | 0 refills | Status: DC

## 2021-07-12 MED ORDER — ACETAMINOPHEN 10 MG/ML IV SOLN
INTRAVENOUS | Status: AC
  Filled 2021-07-12: qty 100

## 2021-07-12 MED ORDER — ACETAMINOPHEN 10 MG/ML IV SOLN
1000.0000 mg | Freq: Once | INTRAVENOUS | Status: DC | PRN
  Administered 2021-07-12: 1000 mg via INTRAVENOUS

## 2021-07-12 MED ORDER — ONDANSETRON HCL 4 MG/2ML IJ SOLN: 4.0000 mg | Freq: Once | INTRAMUSCULAR | Status: DC | PRN

## 2021-07-12 MED ORDER — FENTANYL CITRATE PF 50 MCG/ML IJ SOSY
PREFILLED_SYRINGE | INTRAMUSCULAR | Status: AC
  Filled 2021-07-12: qty 1

## 2021-07-12 MED ORDER — FENTANYL CITRATE (PF) 100 MCG/2ML IJ SOLN
INTRAMUSCULAR | Status: AC
  Filled 2021-07-12: qty 2

## 2021-07-12 MED ORDER — FENTANYL CITRATE PF 50 MCG/ML IJ SOSY
25.0000 ug | PREFILLED_SYRINGE | INTRAMUSCULAR | Status: DC | PRN
  Administered 2021-07-12: 50 ug via INTRAVENOUS

## 2021-07-12 MED ORDER — 0.9 % SODIUM CHLORIDE (POUR BTL) OPTIME
TOPICAL | Status: DC | PRN
  Administered 2021-07-12: 1000 mL

## 2021-07-12 MED ORDER — ONDANSETRON HCL 4 MG/2ML IJ SOLN
INTRAMUSCULAR | Status: DC | PRN
  Administered 2021-07-12: 4 mg via INTRAVENOUS

## 2021-07-12 MED ORDER — DEXAMETHASONE SODIUM PHOSPHATE 10 MG/ML IJ SOLN
INTRAMUSCULAR | Status: DC | PRN
Start: 2021-07-12 — End: 2021-07-12
  Administered 2021-07-12: 10 mg via INTRAVENOUS

## 2021-07-12 MED ORDER — FENTANYL CITRATE (PF) 100 MCG/2ML IJ SOLN
INTRAMUSCULAR | Status: DC | PRN
  Administered 2021-07-12 (×2): 25 ug via INTRAVENOUS

## 2021-07-12 MED ORDER — SODIUM CHLORIDE 0.9% FLUSH: 3.0000 mL | Freq: Two times a day (BID) | INTRAVENOUS | Status: DC

## 2021-07-12 MED ORDER — VANCOMYCIN HCL IN DEXTROSE 1-5 GM/200ML-% IV SOLN
1000.0000 mg | INTRAVENOUS | Status: AC
  Administered 2021-07-12: 1000 mg via INTRAVENOUS
  Filled 2021-07-12: qty 200

## 2021-07-12 MED ORDER — HYDROCODONE-ACETAMINOPHEN 5-325 MG PO TABS: 1.0000 | ORAL_TABLET | Freq: Four times a day (QID) | ORAL | 0 refills | Status: DC | PRN

## 2021-07-12 MED ORDER — ORAL CARE MOUTH RINSE: 15.0000 mL | Freq: Once | OROMUCOSAL | Status: AC

## 2021-07-12 MED ORDER — PROPOFOL 10 MG/ML IV BOLUS
INTRAVENOUS | Status: DC | PRN
  Administered 2021-07-12: 80 mg via INTRAVENOUS

## 2021-07-12 MED ORDER — PHENYLEPHRINE HCL (PRESSORS) 10 MG/ML IV SOLN
INTRAVENOUS | Status: DC | PRN
  Administered 2021-07-12: 80 ug via INTRAVENOUS
  Administered 2021-07-12: 120 ug via INTRAVENOUS
  Administered 2021-07-12: 80 ug via INTRAVENOUS

## 2021-07-12 MED ORDER — ROCURONIUM BROMIDE 10 MG/ML (PF) SYRINGE
PREFILLED_SYRINGE | INTRAVENOUS | Status: DC | PRN
  Administered 2021-07-12: 50 mg via INTRAVENOUS

## 2021-07-12 SURGICAL SUPPLY — 27 items
BAG URO CATCHER STRL LF (MISCELLANEOUS) ×2 IMPLANT
BASKET STONE NCOMPASS (UROLOGICAL SUPPLIES) IMPLANT
CATH URETERAL DUAL LUMEN 10F (MISCELLANEOUS) IMPLANT
CATH URETL OPEN 5X70 (CATHETERS) IMPLANT
CLOTH BEACON ORANGE TIMEOUT ST (SAFETY) ×2 IMPLANT
COVER DOME SNAP 22 D (MISCELLANEOUS) ×1 IMPLANT
EXTRACTOR STONE 1.7FRX115CM (UROLOGICAL SUPPLIES) ×1 IMPLANT
EXTRACTOR STONE NITINOL NGAGE (UROLOGICAL SUPPLIES) IMPLANT
FIBER LASER MOSES 200 DFL (Laser) ×1 IMPLANT
GLOVE SURG POLYISO LF SZ8 (GLOVE) ×2 IMPLANT
GOWN STRL REUS W/TWL XL LVL3 (GOWN DISPOSABLE) ×2 IMPLANT
GUIDEWIRE STR DUAL SENSOR (WIRE) ×3 IMPLANT
IV NS IRRIG 3000ML ARTHROMATIC (IV SOLUTION) ×2 IMPLANT
KIT TURNOVER KIT A (KITS) IMPLANT
LASER FIB FLEXIVA PULSE ID 365 (Laser) IMPLANT
LASER FIB FLEXIVA PULSE ID 550 (Laser) IMPLANT
LASER FIB FLEXIVA PULSE ID 910 (Laser) IMPLANT
MANIFOLD NEPTUNE II (INSTRUMENTS) ×2 IMPLANT
PACK CYSTO (CUSTOM PROCEDURE TRAY) ×2 IMPLANT
SHEATH NAVIGATOR HD 11/13X36 (SHEATH) IMPLANT
SHEATH URETERAL 12FRX35CM (MISCELLANEOUS) ×1 IMPLANT
STENT URET 6FRX26 CONTOUR (STENTS) ×2 IMPLANT
TRACTIP FLEXIVA PULS ID 200XHI (Laser) IMPLANT
TRACTIP FLEXIVA PULSE ID 200 (Laser)
TUBE FEEDING 8FR 16IN STR KANG (MISCELLANEOUS) ×1 IMPLANT
TUBING CONNECTING 10 (TUBING) ×2 IMPLANT
TUBING UROLOGY SET (TUBING) ×2 IMPLANT

## 2021-07-12 NOTE — Anesthesia Postprocedure Evaluation (Signed)
Anesthesia Post Note  Patient: [name]  Procedure(s) Performed: CYSTOSCOPY WITH BILATERAL RETROGRADE PYELOGRAM, URETEROSCOPY HOLMIUM LASER AND STENT PLACEMENT (Bilateral)     Patient location during evaluation: PACU Anesthesia Type: General Level of consciousness: awake and alert, patient cooperative and oriented Pain management: pain level controlled Vital Signs Assessment: post-procedure vital signs reviewed and stable Respiratory status: spontaneous breathing, nonlabored ventilation and respiratory function stable Cardiovascular status: blood pressure returned to baseline and stable Postop Assessment: no apparent nausea or vomiting Anesthetic complications: no   No notable events documented.  Last Vitals:  Vitals:   07/12/21 1245 07/12/21 1300  BP: (!) 122/59 122/70  Pulse: 60 (!) 59  Resp: 16 13  Temp:    SpO2: 92% 95%    Last Pain:  Vitals:   07/12/21 1300  TempSrc:   PainSc: 0-No pain                 Joeli Fenner,E. Roselie Cirigliano

## 2021-07-12 NOTE — Transfer of Care (Signed)
Immediate Anesthesia Transfer of Care Note  Patient: [name]  Procedure(s) Performed: Procedure(s): CYSTOSCOPY WITH BILATERAL RETROGRADE PYELOGRAM, URETEROSCOPY HOLMIUM LASER AND STENT PLACEMENT (Bilateral)  Patient Location: PACU  Anesthesia Type:General  Level of Consciousness: Alert, Awake, Oriented  Airway & Oxygen Therapy: Patient Spontanous Breathing  Post-op Assessment: Report given to RN  Post vital signs: Reviewed and stable  Last Vitals:  Vitals:   07/12/21 0821  BP: 123/64  Pulse: (!) 54  Resp: 18  Temp: 36.4 C  SpO2: 88%    Complications: No apparent anesthesia complications

## 2021-07-12 NOTE — Anesthesia Procedure Notes (Signed)
Procedure Name: Intubation Date/Time: 07/12/2021 10:35 AM Performed by: Gerald Leitz, CRNA Pre-anesthesia Checklist: Patient identified, Patient being monitored, Timeout performed, Emergency Drugs available and Suction available Patient Re-evaluated:Patient Re-evaluated prior to induction Oxygen Delivery Method: Circle system utilized Preoxygenation: Pre-oxygenation with 100% oxygen Induction Type: IV induction Ventilation: Mask ventilation without difficulty Laryngoscope Size: Mac and 3 Grade View: Grade I Tube type: Oral Tube size: 7.5 mm Number of attempts: 1 Placement Confirmation: ETT inserted through vocal cords under direct vision, positive ETCO2 and breath sounds checked- equal and bilateral Secured at: 21 cm Tube secured with: Tape Dental Injury: Teeth and Oropharynx as per pre-operative assessment

## 2021-07-12 NOTE — Discharge Instructions (Signed)
You may remove the stent by pulling the attached strings on Monday morning.  If you don't feel you can do that, please call the office to arrange a time to have the stents removed next week..  Bring the stones to the office.

## 2021-07-12 NOTE — H&P (Signed)
I have kidney stones.  HPI: [name] is a 87 year-old male established patient who is here for renal calculi.    [name] was recently hospitalized on 10/30 with a UTI and left flank pain with hematuria. On CT he has an 65mm non-obstructing right distal stone and small bilateral renal stones. He has had [name] and [name] on cultures and has some bacteriuria on UA today but significant voiding symptoms, fever or pain. He was seen in consultation by Dr. Tresa Hunter in the hospital and ureteroscopy was recommended for stone management. He has a culture pending from 07/01/21.     ALLERGIES: Erythromycin Penicillin Toradol    MEDICATIONS: Metformin Hcl 500 mg tablet  Omeprazole 20 mg tablet, delayed release  Tamsulosin Hcl 0.4 mg capsule  Albuterol Sulfate Hfa 90 mcg hfa aerosol with adapter  Amlodipine Besylate 2.5 mg tablet  Atorvastatin Calcium 80 mg tablet  Eliquis 5 mg tablet  Meclizine Hcl 25 mg tablet  Multiple Vitamin  Naproxen Sodium 220 mg tablet  Osteo Bi-Flex  Symbicort 160 mcg-4.5 mcg/actuation hfa aerosol with adapter  Tramadol Hcl 50 mg tablet  Vitamin C 500 mg tablet     GU PSH: Cystoscopy - 2020 Locm 300-399Mg /Ml Iodine,1Ml - 2020 Prostate Needle Biopsy - 2017       PSH Notes: Knee Replacement, Radiation Therapy, Surgery For Abdominal Aortic Aneurysm, Cath Stent Placement, Gallbladder Surgery, Inguinal Hernia Repair   NON-GU PSH: Knee replacement, Right Pacemaker placement Revise Knee Joint - 2009 Surgical Pathology, Gross And Microscopic Examination For Prostate Needle - 2017     GU PMH: Gross hematuria - 2020, - 2020 BPH w/LUTS - 2017, - 2017, - 2017 Prostate Cancer - 2017, - 2017, Prostate cancer, - 2017 Urge incontinence - 2017, - 2017, Urge incontinence of urine, - 2017 Nocturia - 2017 Urinary Urgency - 2017 Elevated PSA, Elevated prostate specific antigen (PSA) - 2017 ED due to arterial insufficiency, Erectile dysfunction due to arterial  insufficiency - 2014 History of prostate cancer, Prostate Cancer - 2014 Low back pain, Lumbago - 2014 Other microscopic hematuria, Microscopic hematuria - 2014 Pelvic/perineal pain, Abdominal pain, suprapubic - 2014 Ureteral calculus, Distal Ureteral Stone On The Right - 2014, Calculus of ureter, - 2014      PMH Notes:  2006-11-09 13:57:21 - Note: Arthritis   NON-GU PMH: Hypercholesterolemia, High cholesterol - 2017 Myocardial Infarction, History of myocardial infarction - 2017 Personal history of other diseases of the circulatory system, History of hypertension - 2017, History of cardiac disorder, - 2017 Personal history of other diseases of the digestive system, History of esophageal reflux - 2017 Personal history of other diseases of the nervous system and sense organs, History of sleep apnea - 2017 Personal history of other endocrine, nutritional and metabolic disease, History of diabetes mellitus - 2017, History of hypercholesterolemia, - 2014 Anxiety, Anxiety - 2014 Congenital single renal cyst, Congenital renal cyst, single - 2014 Gout, Gout - 2014 Personal history of other mental and behavioral disorders, History of depression - 2014 Encounter for general adult medical examination without abnormal findings, Encounter for preventive health examination    FAMILY HISTORY: Acute Myocardial Infarction - Mother, Father Chronic renal failure, unspecified stage - Runs In Family Deceased - Runs In Family Diabetes - Runs In Family Family Health Status Number - Runs In Family malignant neoplasm of brain - Runs In Family Prostate Cancer - Brother   SOCIAL HISTORY: Marital Status: Married     Notes: Married, Alcohol use, Former smoker, Number of children, Retired,  Caffeine use, Alcohol Use, Tobacco Use, Marital History - Currently Married   REVIEW OF SYSTEMS:    GU Review Male:   Patient reports get up at night to urinate, leakage of urine, and stream starts and stops. Patient denies  frequent urination, hard to postpone urination, burning/ pain with urination, trouble starting your stream, have to strain to urinate , erection problems, and penile pain.  Gastrointestinal (Upper):   Patient denies nausea, vomiting, and indigestion/ heartburn.  Gastrointestinal (Lower):   Patient denies diarrhea and constipation.  Constitutional:   Patient denies fever, night sweats, weight loss, and fatigue.  Skin:   Patient denies skin rash/ lesion and itching.  Eyes:   Patient denies blurred vision and double vision.  Ears/ Nose/ Throat:   Patient denies sore throat and sinus problems.  Hematologic/Lymphatic:   Patient denies swollen glands and easy bruising.  Cardiovascular:   Patient denies leg swelling and chest pains.  Respiratory:   Patient reports shortness of breath. Patient denies cough.  Endocrine:   Patient denies excessive thirst.  Musculoskeletal:   Patient denies joint pain and back pain.  Neurological:   Patient denies headaches and dizziness.  Psychologic:   Patient denies depression and anxiety.   Notes: kidney stones    VITAL SIGNS:      07/03/2021 09:42 AM  Weight 200 lb / 90.72 kg  Height 71 in / 180.34 cm  BP 145/76 mmHg  Pulse 61 /min  Temperature 97.3 F / 36.2 C  BMI 27.9 kg/m   MULTI-SYSTEM PHYSICAL EXAMINATION:    Constitutional: Obese. No physical deformities. Normally developed. Good grooming.   Respiratory: Normal breath sounds. No labored breathing, no use of accessory muscles.   Cardiovascular: Regular rate and rhythm. No murmur, no gallop.   Skin: No paleness, no jaundice, no cyanosis. No lesion, no ulcer, no rash.  Neurologic / Psychiatric: Oriented to time, oriented to place, oriented to person. No depression, no anxiety, no agitation.  Gastrointestinal: Abdominal tenderness, minimal RLQ. , obese. No mass, no rigidity.   Musculoskeletal: Normal gait and station of head and neck.     Complexity of Data:  Lab Test Review:   BMP  Records  Review:   AUA Symptom Score, Previous Hospital Records  Urine Test Review:   Urinalysis, Urine Culture  X-Ray Review: C.T. Abdomen/Pelvis: Reviewed Films. Reviewed Report. Discussed With Patient.     PROCEDURES:          Urinalysis w/Scope Dipstick Dipstick Cont'd Micro  Color: Yellow Bilirubin: Neg mg/dL WBC/hpf: 0 - 5/hpf  Appearance: Cloudy Ketones: Neg mg/dL RBC/hpf: 0 - 2/hpf  Specific Gravity: 1.020 Blood: 1+ ery/uL Bacteria: Mod (26-50/hpf)  pH: 6.0 Protein: 2+ mg/dL Cystals: Amorph Phosphates  Glucose: Neg mg/dL Urobilinogen: 1.0 mg/dL Casts: Granular    Nitrites: Neg Trichomonas: Not Present    Leukocyte Esterase: Neg leu/uL Mucous: Present      Epithelial Cells: 0 - 5/hpf      Yeast: NS (Not Seen)      Sperm: Not Present    ASSESSMENT:      ICD-10 Details  2 GU:   Renal calculus - N20.0 Bilateral, Chronic, Stable - He has small bilateral renal stones and an 34mm right distal stone. I will get him set up for cystoscopy with bilateral URS and stent insertion. I have reviewed the risks of ureteroscopy including bleeding, infection, ureteral injury, need for a stent or secondary procedures, thrombotic events and anesthetic complications. I will get him cleared to come off  of the eliquis by Dr. Aundra Dubin.   3   Ureteral calculus - N20.1 Right, Chronic, Stable  1 NON-GU:   Bacteriuria - R82.71 Chronic, Stable - Urine culture today. I will consider preop antibiotic therapy based on the culture.    PLAN:           Orders Labs Urine Culture          Schedule Return Visit/Planned Activity: Next Available Appointment - Schedule Surgery  Procedure: Unspecified Date - Cysto Uretero Lithotripsy - [phone], bilateral Notes: next available          Document Letter(s):  Created for Patient: Clinical Summary

## 2021-07-12 NOTE — Op Note (Signed)
Procedure: 1.  Cystoscopy with right.  Possible ureteroscopy with laser lithotripsy, stone extraction and insertion of double-J stent. 2.  Left ureteroscopy with stone extraction..  3.  Left stent insertion. 4.  Bladder biopsy and fulguration of 1 cm right trigone lesion. 5.  Application of fluoroscopy.  Preop diagnosis: 11 mm right distal ureteral stone and bilateral renal stones.  Postop diagnosis: Same with erythematous lesion of the right trigone.  Surgeon: Dr. Irine Seal.  Anesthesia: General.  Specimen: 1.  Bladder biopsy, to pathology.  2.  Stone fragments, 2 office for analysis.  Drains: Bilateral 6 French by 26 cm contour double-J stent with tether's.  EBL: None.  Complications: None.  Indications: The patient is an 87 year old male who was recently found to have an 11 mm nonobstructing right distal ureteral stone and small bilateral renal stones.  He initially had a culture that was positive for Enterococcus and Morganella and was treated with antibiotic therapy.  His subsequent cultures have been negative and he is to undergo management of his stones.  Procedure: He was taken operating room where he was given gentamicin and vancomycin.  A general anesthetic was induced.  He was placed in lithotomy position and and fitted with PAS hose.  His perineum and genitalia were prepped with Betadine solution.  He was draped in usual sterile fashion.  Cystoscopy was performed using a 21 [place] scope and 30 degree lens.  Examination revealed a normal urethra.  The external sphincter was intact.  The prostatic urethra was approximately 2 cm in length with bilobar hyperplasia with some blanching suggestive of radiation effect.  Examination of bladder revealed mild trabeculation.  There was a small erythematous lesion just superior to the right ureteral orifice that is most suspicious for inflammation but neoplasm could not be entirely excluded.  The left ureteral orifice was unremarkable.  The  right ureteral orifice was patulous and the large stone was visible just inside the orifice.  An 8 [place] feeding tube was placed into the bladder.  The 4.5 French short semirigid ureteroscope was then advanced per urethra and a guidewire was passed to the right kidney.  The scope was then placed alongside the wire and the distal ureteral stone was fragmented using the Moses laser with a 200 m fiber on the dusting setting setting but I primarily used the right pedal for fragmentation.  Once the stone had been adequately fragmented the larger ureteral fragments were removed with an engage basket to the bladder.  I advanced the ureteroscope more proximally no significant stones were identified.  A 12/14 French 35 cm digital access sheath was then advanced over the wire to the proximal ureter.  The inner core and wire were removed.  The dual-lumen digital flexible scope was then passed to the right kidney.  A small stone it was noted in the upper calyx consistent with the findings from CT there was a slight purulent coating around the stone but it was removed intact.  Additional stones were identified in the lower calyx which were also removed and there was a little bit more of the purulent material.  Care was taken to keep the irrigation pressure quite low.  Once the stones have been removed, the guidewire was replaced to the kidney and the access sheath was removed.  I then used the semirigid ureteroscope to advance a sensor wire to the right kidney.  The 35 cm digital access sheath was then advanced to the proximal ureter without difficulty and the inner core  and wire were removed.  The dual-lumen digital scope was passed and 2 upper pole stones were identified and removed.  Further inspection of the collecting system revealed no additional retrievable stones.  The guidewire was reinserted to the kidney and the access sheath was removed.  The feeding tube was removed and the cystoscope was then reinserted  alongside the wires.  The bladder was evacuated free of stone fragments.  A cup biopsy forceps was then used to take a single biopsy from the lesion just above the right ureteral orifice and the biopsy site was then generously fulgurated with a Bugbee electrode.  The irrigant had been changed to water for this portion of the procedure from saline.  The cystoscope was then removed and replaced over the left sensor wire and a 6 [place] by 26 cm contour double-J stent with tether was advanced the kidney under fluoroscopic guidance.  The wire was removed, leaving a good coil in the kidney and a good coil in the bladder.  The cystoscope was then placed over the right sensor wire and a 6 [place] by 26 cm contour double-J stent with tether was was advanced to the kidney under fluoroscopic guidance without difficulty.  The wire was removed, leaving good coil in the kidney and a good coil in the bladder.  The bladder was drained and the cystoscope was removed.  The stent strings were left exiting the urethral meatus.  Final fluoroscopic assessment demonstrated good position of both stents.  The strings were secured to the patient's penis with tape.  He was taken down from lithotomy position, his anesthetic was reversed and he was moved to recovery in stable condition.  There were no complications.

## 2021-07-13 LAB — URINE CULTURE: Culture: NO GROWTH

## 2021-07-14 ENCOUNTER — Encounter (HOSPITAL_COMMUNITY): Payer: Self-pay | Admitting: Urology

## 2021-07-16 ENCOUNTER — Other Ambulatory Visit: Payer: Self-pay | Admitting: Cardiology

## 2021-07-16 ENCOUNTER — Other Ambulatory Visit: Payer: Self-pay | Admitting: Family Medicine

## 2021-07-16 DIAGNOSIS — R600 Localized edema: Secondary | ICD-10-CM

## 2021-07-16 DIAGNOSIS — E1142 Type 2 diabetes mellitus with diabetic polyneuropathy: Secondary | ICD-10-CM

## 2021-07-16 DIAGNOSIS — R0609 Other forms of dyspnea: Secondary | ICD-10-CM

## 2021-07-16 LAB — SURGICAL PATHOLOGY

## 2021-07-17 ENCOUNTER — Other Ambulatory Visit: Payer: Self-pay

## 2021-07-17 ENCOUNTER — Telehealth: Payer: Self-pay | Admitting: Cardiology

## 2021-07-17 ENCOUNTER — Ambulatory Visit: Payer: Medicare Other | Admitting: Internal Medicine

## 2021-07-17 VITALS — BP 84/44 | HR 69 | Ht 71.0 in | Wt 191.0 lb

## 2021-07-17 DIAGNOSIS — I10 Essential (primary) hypertension: Secondary | ICD-10-CM | POA: Diagnosis not present

## 2021-07-17 DIAGNOSIS — I2510 Atherosclerotic heart disease of native coronary artery without angina pectoris: Secondary | ICD-10-CM

## 2021-07-17 DIAGNOSIS — I441 Atrioventricular block, second degree: Secondary | ICD-10-CM

## 2021-07-17 DIAGNOSIS — I4819 Other persistent atrial fibrillation: Secondary | ICD-10-CM

## 2021-07-17 DIAGNOSIS — I255 Ischemic cardiomyopathy: Secondary | ICD-10-CM

## 2021-07-17 DIAGNOSIS — G4733 Obstructive sleep apnea (adult) (pediatric): Secondary | ICD-10-CM

## 2021-07-17 NOTE — Patient Instructions (Addendum)
Medication Instructions:  Hold your Eliquis for 10 days Your physician recommends that you continue on your current medications as directed. Please refer to the Current Medication list given to you today. *If you need a refill on your cardiac medications before your next appointment, please call your pharmacy*  Lab Work: None. If you have labs (blood work) drawn today and your tests are completely normal, you will receive your results only by: [place] (if you have MyChart) OR A paper copy in the mail If you have any lab test that is abnormal or we need to change your treatment, we will call you to review the results.  Testing/Procedures: None.  Follow-Up: At [facility], you and your health needs are our priority.  As part of our continuing mission to provide you with exceptional heart care, we have created designated Provider Care Teams.  These Care Teams include your primary Cardiologist (physician) and Advanced Practice Providers (APPs -  Physician Assistants and Nurse Practitioners) who all work together to provide you with the care you need, when you need it.  Your physician wants you to follow-up in: 12 months with  Thompson Grayer, MD in [facility] will receive a reminder letter in the mail two months in advance. If you don't receive a letter, please call our office to schedule the follow-up appointment.  Remote monitoring is used to monitor your Pacemaker from home. This monitoring reduces the number of office visits required to check your device to one time per year. It allows [place] to keep an eye on the functioning of your device to ensure it is working properly. You are scheduled for a device check from home on 08/20/21. You may send your transmission at any time that day. If you have a wireless device, the transmission will be sent automatically. After your physician reviews your transmission, you will receive a postcard with your next transmission date.  We recommend  signing up for the patient portal called "MyChart".  Sign up information is provided on this After Visit Summary.  MyChart is used to connect with patients for Virtual Visits (Telemedicine).  Patients are able to view lab/test results, encounter notes, upcoming appointments, etc.  Non-urgent messages can be sent to your provider as well.   To learn more about what you can do with MyChart, go to [email or web address].    Any Other Special Instructions Will Be Listed Below (If Applicable).
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
## 2021-07-17 NOTE — Telephone Encounter (Signed)
Ok to hold eliquis 48 hours to see if bleeding resolves.  Zandra Abts MD

## 2021-07-17 NOTE — Telephone Encounter (Signed)
Pt's daughter notified and verbalized understanding. Pt/pt's daughter to call office Monday and update [place] on if holding Eliquis helped resolve bleeding issue

## 2021-07-17 NOTE — Telephone Encounter (Signed)
Patient's daughter called stating her father had some kidney stones removed.  He is still have blood in his urine, the urologist recommended he stop taking eliquis for 1-2 days.  She wants to check first with [place] to make sure that was okay.  Please advise.

## 2021-07-20 ENCOUNTER — Other Ambulatory Visit: Payer: Self-pay | Admitting: Family Medicine

## 2021-07-26 DIAGNOSIS — N202 Calculus of kidney with calculus of ureter: Secondary | ICD-10-CM | POA: Diagnosis not present

## 2021-07-26 DIAGNOSIS — R310 Gross hematuria: Secondary | ICD-10-CM | POA: Diagnosis not present

## 2021-07-30 DIAGNOSIS — R310 Gross hematuria: Secondary | ICD-10-CM | POA: Diagnosis not present

## 2021-07-30 DIAGNOSIS — N202 Calculus of kidney with calculus of ureter: Secondary | ICD-10-CM | POA: Diagnosis not present

## 2021-08-05 ENCOUNTER — Telehealth: Payer: Self-pay | Admitting: *Deleted

## 2021-08-05 ENCOUNTER — Other Ambulatory Visit: Payer: Self-pay | Admitting: *Deleted

## 2021-08-05 MED ORDER — SACUBITRIL-VALSARTAN 24-26 MG PO TABS: 1.0000 | ORAL_TABLET | Freq: Two times a day (BID) | ORAL | 3 refills | Status: DC

## 2021-08-05 MED ORDER — APIXABAN 5 MG PO TABS: 5.0000 mg | ORAL_TABLET | Freq: Two times a day (BID) | ORAL | 3 refills | Status: DC

## 2021-08-05 NOTE — Telephone Encounter (Signed)
Daughter walked into office to drop off patient assistance paperwork for eliquis and entresto and informed nurse that patient started seeing blood in urine again shortly after restarting eliquis and has stopped it again. Advised to contact urologist to report blood in urine. Verbalized understanding

## 2021-08-06 MED ORDER — APIXABAN 5 MG PO TABS: 5.0000 mg | ORAL_TABLET | Freq: Two times a day (BID) | ORAL | Status: DC

## 2021-08-06 NOTE — Telephone Encounter (Signed)
Looks like Dr [name] had held 10 days due to hematuria, sorry to hear recurrent. Agree with contacting urologist, would hold eliquis additional 10 days and then call and update [place] on bleeding and what his urologist has advised him  Zandra Abts MD

## 2021-08-06 NOTE — Telephone Encounter (Signed)
Wife [name]) notified & verbalized understanding.

## 2021-08-20 ENCOUNTER — Ambulatory Visit (INDEPENDENT_AMBULATORY_CARE_PROVIDER_SITE_OTHER): Payer: Medicare Other

## 2021-08-20 DIAGNOSIS — I441 Atrioventricular block, second degree: Secondary | ICD-10-CM | POA: Diagnosis not present

## 2021-08-20 LAB — CUP PACEART REMOTE DEVICE CHECK
Battery Remaining Longevity: 94 mo
Battery Remaining Percentage: 76 %
Battery Voltage: 3.01 V
Brady Statistic AP VP Percent: 60 %
Brady Statistic AP VS Percent: 1 %
Brady Statistic AS VP Percent: 40 %
Brady Statistic AS VS Percent: 1 %
Brady Statistic RA Percent Paced: 60 %
Brady Statistic RV Percent Paced: 99 %
Date Time Interrogation Session: 20221227020016
Implantable Lead Implant Date: 20200630
Implantable Lead Implant Date: 20200630
Implantable Lead Location: 753859
Implantable Lead Location: 753860
Implantable Pulse Generator Implant Date: 20200630
Lead Channel Impedance Value: 550 Ohm
Lead Channel Impedance Value: 680 Ohm
Lead Channel Pacing Threshold Amplitude: 0.75 V
Lead Channel Pacing Threshold Amplitude: 0.75 V
Lead Channel Pacing Threshold Pulse Width: 0.5 ms
Lead Channel Pacing Threshold Pulse Width: 0.5 ms
Lead Channel Sensing Intrinsic Amplitude: 1.4 mV
Lead Channel Sensing Intrinsic Amplitude: 6.8 mV
Lead Channel Setting Pacing Amplitude: 1 V
Lead Channel Setting Pacing Amplitude: 2 V
Lead Channel Setting Pacing Pulse Width: 0.5 ms
Lead Channel Setting Sensing Sensitivity: 4 mV
Pulse Gen Model: 2272
Pulse Gen Serial Number: 3311958

## 2021-08-21 ENCOUNTER — Telehealth: Payer: Self-pay | Admitting: Family Medicine

## 2021-08-21 ENCOUNTER — Ambulatory Visit (INDEPENDENT_AMBULATORY_CARE_PROVIDER_SITE_OTHER): Payer: Medicare Other | Admitting: Family Medicine

## 2021-08-21 DIAGNOSIS — F321 Major depressive disorder, single episode, moderate: Secondary | ICD-10-CM | POA: Diagnosis not present

## 2021-08-21 MED ORDER — MIRTAZAPINE 7.5 MG PO TABS: 7.5000 mg | ORAL_TABLET | Freq: Every day | ORAL | 0 refills | Status: DC

## 2021-08-21 NOTE — Telephone Encounter (Signed)
Telephone visit completed. New rx sent.
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
## 2021-08-21 NOTE — Telephone Encounter (Signed)
Pts daughter called stating that she talked with pt and he agreed to take medicine for anxiety/depression so that he would stop crying.   (Daughter says that she talked with Dr Lajuana Ripple about this yesterday and at pts last visit so Dr Lajuana Ripple will know what she is talking about.)  Please advise and call daughter. [phone]

## 2021-08-23 ENCOUNTER — Ambulatory Visit (INDEPENDENT_AMBULATORY_CARE_PROVIDER_SITE_OTHER): Payer: Medicare Other | Admitting: Licensed Clinical Social Worker

## 2021-08-23 DIAGNOSIS — G4733 Obstructive sleep apnea (adult) (pediatric): Secondary | ICD-10-CM

## 2021-08-23 DIAGNOSIS — E538 Deficiency of other specified B group vitamins: Secondary | ICD-10-CM

## 2021-08-23 DIAGNOSIS — E1142 Type 2 diabetes mellitus with diabetic polyneuropathy: Secondary | ICD-10-CM

## 2021-08-23 DIAGNOSIS — I480 Paroxysmal atrial fibrillation: Secondary | ICD-10-CM

## 2021-08-23 DIAGNOSIS — I25810 Atherosclerosis of coronary artery bypass graft(s) without angina pectoris: Secondary | ICD-10-CM

## 2021-08-23 DIAGNOSIS — C61 Malignant neoplasm of prostate: Secondary | ICD-10-CM

## 2021-08-23 DIAGNOSIS — E1169 Type 2 diabetes mellitus with other specified complication: Secondary | ICD-10-CM

## 2021-08-23 DIAGNOSIS — Z951 Presence of aortocoronary bypass graft: Secondary | ICD-10-CM

## 2021-08-23 NOTE — Chronic Care Management (AMB) (Signed)
Chronic Care Management    Clinical Social Work Note  08/23/2021 Name: [name] MRN: [id number] DOB: 1932/10/28  [name] is a 87 y.o. year old male who is a primary care patient of Calvin Norlander, DO. The CCM team was consulted to assist the patient with chronic disease management and/or care coordination needs related to: [name] .   Engaged with patient / daughter of patient, [name], by telephone for follow up visit in response to provider referral for social work chronic care management and care coordination services.   Consent to Services:  The patient was given information about Chronic Care Management services, agreed to services, and gave verbal consent prior to initiation of services.  Please see initial visit note for detailed documentation.   Patient agreed to services and consent obtained.   Assessment: Review of patient past medical history, allergies, medications, and health status, including review of relevant consultants reports was performed today as part of a comprehensive evaluation and provision of chronic care management and care coordination services.     SDOH (Social Determinants of Health) assessments and interventions performed:  SDOH Interventions    Flowsheet Row Most Recent Value  SDOH Interventions   Physical Activity Interventions Other (Comments)  [client has walking challenges. client uses a walker to help him walk]  Stress Interventions Other (Comment)  [client has stress related to managing medical needs. client has stress related to decreased physical functioning]  Depression Interventions/Treatment  Counseling        Advanced Directives Status: See [employer] application for related entries.  CCM Care Plan  Allergies  Allergen Reactions   Penicillins Other (See Comments)    Unknown   Tramadol Other (See Comments)    Dizzy   Ketorolac Tromethamine Rash    Outpatient Encounter Medications as of 08/23/2021   Medication Sig   acetaminophen (TYLENOL) 500 MG tablet Take 500-1,000 mg by mouth every 6 (six) hours as needed for moderate pain.   albuterol (PROVENTIL HFA;VENTOLIN HFA) 108 (90 Base) MCG/ACT inhaler Inhale 2 puffs into the lungs every 6 (six) hours as needed for wheezing or shortness of breath.   amiodarone (PACERONE) 200 MG tablet TAKE 1 TABLET BY MOUTH  DAILY (Patient taking differently: Take 200 mg by mouth daily.)   apixaban (ELIQUIS) 5 MG TABS tablet Take 1 tablet (5 mg total) by mouth 2 (two) times daily. (HOLDING CURRENTLY DUE TO HEMATURIA) 08/06/2021   BESIVANCE 0.6 % SUSP Place 1 drop into both eyes daily as needed (dry eyes).   budesonide-formoterol (SYMBICORT) 160-4.5 MCG/ACT inhaler Inhale 2 puffs into the lungs 2 (two) times daily.   furosemide (LASIX) 20 MG tablet TAKE 1 TABLET BY MOUTH  EVERY OTHER DAY   HYDROcodone-acetaminophen (NORCO/VICODIN) 5-325 MG tablet Take 1 tablet by mouth every 6 (six) hours as needed for moderate pain.   ketoconazole (NIZORAL) 2 % cream Apply topically daily.   Lancets (ONETOUCH ULTRASOFT) lancets Use to check blood sugars daily   meclizine (ANTIVERT) 25 MG tablet Take 1 tablet (25 mg total) by mouth 3 (three) times daily as needed for dizziness.   metFORMIN (GLUCOPHAGE) 500 MG tablet TAKE 1 TABLET BY MOUTH  TWICE DAILY WITH A MEAL   metoprolol succinate (TOPROL-XL) 25 MG 24 hr tablet Take 0.5 tablets (12.5 mg total) by mouth daily. (Patient taking differently: Take 25 mg by mouth daily.)   mirtazapine (REMERON) 7.5 MG tablet Take 1 tablet (7.5 mg total) by mouth at bedtime. For anxiety/ depression  omeprazole (PRILOSEC) 20 MG capsule TAKE 1 CAPSULE BY MOUTH  DAILY (Patient taking differently: Take 20 mg by mouth daily.)   ondansetron (ZOFRAN ODT) 4 MG disintegrating tablet Take 1 tablet (4 mg total) by mouth every 8 (eight) hours as needed for nausea or vomiting.   ONETOUCH VERIO test strip TEST BLOOD SUGARS DAILY DX E11.9   rosuvastatin (CRESTOR)  40 MG tablet Take 1 tablet (40 mg total) by mouth daily. To REPLACE Atorvastatin (Patient taking differently: Take 40 mg by mouth daily.)   sacubitril-valsartan (ENTRESTO) 24-26 MG Take 1 tablet by mouth 2 (two) times daily.   spironolactone (ALDACTONE) 25 MG tablet Take 0.5 tablets (12.5 mg total) by mouth daily.   sulfamethoxazole-trimethoprim (BACTRIM DS) 800-160 MG tablet Take 0.5 tablets by mouth 2 (two) times daily.   No facility-administered encounter medications on file as of 08/23/2021.    Patient Active Problem List   Diagnosis Date Noted   Complicated UTI (urinary tract infection) 06/23/2021   HFrEF (heart failure with reduced ejection fraction) ([place]) 06/23/2021   AKI (acute kidney injury) ([place]) 06/23/2021   Urinary tract infection without hematuria 06/14/2021   Urine discoloration 06/04/2021   Dysuria 06/04/2021   COVID-19 virus RNA test result positive at limit of detection 02/01/2021   Upper respiratory infection with cough and congestion 02/01/2021   Kidney stones 07/04/2020   Hyperlipidemia associated with type 2 diabetes mellitus ([place]) 07/05/2019   Paroxysmal atrial fibrillation ([place]) 06/29/2019   Acquired thrombophilia ([place]) 06/29/2019   Second degree Mobitz II AV block 02/22/2019   B12 deficiency 12/18/2017   Status post right knee replacement 11/10/2016   S/P repair of abdominal aortic aneurysm using bifurcation graft 10/03/2016   Mixed incontinence 04/03/2015   PVC's (premature ventricular contractions) 06/05/2014   BMI 29.0-29.9,adult 83/38/2505   Diastolic dysfunction- grade 1 by echo June 2015, EF 50% 03/28/2014   OSA (obstructive sleep apnea) 12/07/2013   Sinus bradycardia- ? symptomatic 05/17/2013   Coronary artery disease involving nonautologous biological coronary bypass graft without angina pectoris    Thrombocytopenia (H[facility])    S/P CABG x 3 08/21/2011   Hypertensive cardiovascular disease    Prostate cancer ([place])    Hyperlipidemia with target LDL less  than 100    Diabetes ([place]) 11/28/2010    Conditions to be addressed/monitored: monitor client completion of ADLs. Monitor client management of depression issues  Care Plan : General Social Work (Adult)  Updates made by Katha Cabal, [name] since 08/23/2021 12:00 AM     Problem: Coping Skills (General Plan of Care)      Goal: Coping Skills Enhanced; Complete ADLs as able; Manage depression issues   Start Date: 08/23/2021  Expected End Date: 11/20/2021  This Visit's Progress: On track  Recent Progress: On track  Priority: Medium  Note:   Current barriers:   Patient in need of assistance with connecting to community resources for possible help with ADLs completion Patient is unable to independently navigate community resource options without care coordination support Mobility issues Depression issues   Clinical Goals:   [name] to communicate with client in next 30 days to assess mobility of client and in home care needs of client Client to call RNCM as needed in next 30 days for CCM nursing support Client to communicate regularly with his daughters in next 30 days to discuss needs of client [name] to communicate with client in next 30 days to assess client management of depression issues  Clinical Interventions:  Collaboration with Ronnie Doss  M, DO regarding development and update of comprehensive plan of care as evidenced by provider attestation and co-signature Discussed with [name], daughter of client, current status and needs of client.  Discussed with Denisea the sleeping issues of client and pain issues of client Discussed with Denisea family support for client (client has support from his 4 daughters) Reviewed with Denisea client ambulation challenges. Client uses a walker to help him walk.  Reviewed with Denisea the mood status of client. Client gets sad occasionally but overall his mood is stable. Denisea said client sometimes gets sad because he cannot do the  physical things he once did.  Denisea said client enjoys visiting with guests in his home. She said he likes talking with family members via phone Encouraged client or Denisea to call RNCM as needed in next 30 days for CCM nursing support for client  Patient Coping Skills: Has strong family support from his 4 daughters Takes medications as prescribed Attends scheduled medical appointments  Patient Deficits: Walking challenges, uses a walker to help him walk Some pain issues  Patient Goals: In next 30 days, client will:  Attend scheduled medical appointments Communicate regularly with daughters regarding his ongoing needs Will participate actively in physical therapy sessions for client  Follow Up Plan: [name] will call client or daughter of client on 10/22/21 at 1:00 PM to  assess client needs       [name].Mylan Lengyel MSW, [name] Licensed Clinical Social Worker [facility] Care Management [phone]

## 2021-08-23 NOTE — Patient Instructions (Addendum)
Visit Information  Patient Goals:  Manage Emotions. Complete ADLs daily, as able. Manage depression issues  Timeframe:  Short-Term Goal Priority:  Medium Progress: On Track Start Date:          08/23/21                  Expected End Date:          11/18/21            Follow Up Date  10/22/21 at 1:00 PM   Manage Emotions: Complete ADLs daily, as able . Manage depression issues   Why is this important?   When you are stressed, down or upset, your body reacts too.  For example, your blood pressure may get higher; you may have a headache or stomachache.  When your emotions get the best of you, your body's ability to fight off cold and flu gets weak.  These steps will help you manage your emotions.     Patient Coping Skills:  Takes medications as prescribed Attends scheduled medical appointments Has family support from his daughters  Patient Deficits: Mobility issues Needs occasional help in completing ADLs  Patient Goals:   Attend scheduled medical appointments in next 30 days Taked medications as prescribed in next 30 days Communicate with RNCM and [name] as needed in next 30 days for CCM support -  Follow Up Plan: [name] to call client or his daughter on 10/22/21 at 1:00 PM to discuss needs of client at that time  [name].Solene Hereford MSW, [name] Licensed Clinical Social Worker [facility] Care Management [phone]

## 2021-08-24 ENCOUNTER — Other Ambulatory Visit: Payer: Self-pay | Admitting: Family Medicine

## 2021-08-24 ENCOUNTER — Other Ambulatory Visit: Payer: Self-pay | Admitting: Urology

## 2021-08-24 DIAGNOSIS — F321 Major depressive disorder, single episode, moderate: Secondary | ICD-10-CM

## 2021-08-27 ENCOUNTER — Telehealth: Payer: Self-pay | Admitting: Family Medicine

## 2021-08-27 ENCOUNTER — Ambulatory Visit (INDEPENDENT_AMBULATORY_CARE_PROVIDER_SITE_OTHER): Payer: Medicare Other | Admitting: Family Medicine

## 2021-08-27 ENCOUNTER — Encounter: Payer: Self-pay | Admitting: Family Medicine

## 2021-08-27 VITALS — BP 108/59 | HR 68 | Temp 97.1°F | Ht 71.0 in | Wt 185.4 lb

## 2021-08-27 DIAGNOSIS — R531 Weakness: Secondary | ICD-10-CM

## 2021-08-27 DIAGNOSIS — R310 Gross hematuria: Secondary | ICD-10-CM

## 2021-08-27 DIAGNOSIS — R319 Hematuria, unspecified: Secondary | ICD-10-CM | POA: Diagnosis not present

## 2021-08-27 LAB — URINALYSIS, ROUTINE W REFLEX MICROSCOPIC
Bilirubin, UA: NEGATIVE
Glucose, UA: NEGATIVE
Ketones, UA: NEGATIVE
Leukocytes,UA: NEGATIVE
Nitrite, UA: NEGATIVE
Specific Gravity, UA: 1.02 (ref 1.005–1.030)
Urobilinogen, Ur: 1 mg/dL (ref 0.2–1.0)
pH, UA: 6 (ref 5.0–7.5)

## 2021-08-27 LAB — MICROSCOPIC EXAMINATION: RBC, Urine: >30 /hpf — AB (ref 0–2)

## 2021-08-27 NOTE — Telephone Encounter (Signed)
Pt takes mirtazapine (REMERON) 7.5 MG tablet and his daughter said one side effect is sleep issues, he already has issues with sleeping so he would like to know if taking half of the dose would be okay. Please call back and advise

## 2021-08-27 NOTE — Progress Notes (Signed)
Assessment & Plan:  1. Pilar Plate hematuria Advised to continue to hold Eliquis as directed by cardiology. Discussed risks of holding Eliquis and urgency of figuring out what is causing his hematuria. Advised to call urologist to get an appointment ASAP. I did call urologist and make them aware of the situation. They did confirm patient has not been seen recently regarding the hematuria. A message is being routed to Dr. Jeffie Pollock as they do not have any openings today and they will call patient with his response.  - Urinalysis, Routine w reflex microscopic - Urine dipstick shows positive for RBC's and positive for protein.  Micro exam: >30 RBC's per HPF.  2. Generalized weakness - Ambulatory referral to Home Health   Follow up plan: Return if symptoms worsen or fail to improve.  Hendricks Limes, MSN, APRN, [name]-C [facility]  Subjective:   Patient ID: [name], male    DOB: [birth year], 88 y.o.   MRN: [id number]  HPI: [name] is a 88 y.o. male presenting on 08/27/2021 for Hematuria (X 3 days )  Patient is accompanied by his daughter, who he is okay with being present.  Patient reports hematuria x3 days. This has occurred in the past with his last episode less than one month ago. He is on Eliquis due to A-Fib and history of CVA. He has stopped his Eliquis as this is what he was previously advised to do x10 days by his cardiologist. Patient reports the on-call urologist told him on Saturday that the bleeding may be due to the stents, but these were previously pulled out. Patient's urologist is Dr. Jeffie Pollock with [facility], who he has not seen since the bleeding episodes started after his cystoscopy on 07/12/2021. He did have one follow-up 2 weeks after the cystoscopy, but the hematuria was not occurring at that time.   Patient also reports generalized weakness since the procedure since he was lying around more. He has done physical therapy in the past and thinks this  may be helpful.   ROS: Negative unless specifically indicated above in HPI.   Relevant past medical history reviewed and updated as indicated.   Allergies and medications reviewed and updated.   Current Outpatient Medications:    acetaminophen (TYLENOL) 500 MG tablet, Take 500-1,000 mg by mouth every 6 (six) hours as needed for moderate pain., Disp: , Rfl:    albuterol (PROVENTIL HFA;VENTOLIN HFA) 108 (90 Base) MCG/ACT inhaler, Inhale 2 puffs into the lungs every 6 (six) hours as needed for wheezing or shortness of breath., Disp: 1 Inhaler, Rfl: 2   amiodarone (PACERONE) 200 MG tablet, TAKE 1 TABLET BY MOUTH  DAILY (Patient taking differently: Take 200 mg by mouth daily.), Disp: 90 tablet, Rfl: 3   BESIVANCE 0.6 % SUSP, Place 1 drop into both eyes daily as needed (dry eyes)., Disp: , Rfl:    budesonide-formoterol (SYMBICORT) 160-4.5 MCG/ACT inhaler, Inhale 2 puffs into the lungs 2 (two) times daily., Disp: 1 Inhaler, Rfl: 5   furosemide (LASIX) 20 MG tablet, TAKE 1 TABLET BY MOUTH  EVERY OTHER DAY, Disp: 45 tablet, Rfl: 3   HYDROcodone-acetaminophen (NORCO/VICODIN) 5-325 MG tablet, Take 1 tablet by mouth every 6 (six) hours as needed for moderate pain., Disp: 12 tablet, Rfl: 0   ketoconazole (NIZORAL) 2 % cream, Apply topically daily., Disp: 15 g, Rfl: 0   Lancets (ONETOUCH ULTRASOFT) lancets, Use to check blood sugars daily, Disp: 100 each, Rfl: 3   meclizine (ANTIVERT) 25 MG  tablet, Take 1 tablet (25 mg total) by mouth 3 (three) times daily as needed for dizziness., Disp: 10 tablet, Rfl: 0   metFORMIN (GLUCOPHAGE) 500 MG tablet, TAKE 1 TABLET BY MOUTH  TWICE DAILY WITH A MEAL, Disp: 180 tablet, Rfl: 0   metoprolol succinate (TOPROL-XL) 25 MG 24 hr tablet, Take 0.5 tablets (12.5 mg total) by mouth daily. (Patient taking differently: Take 25 mg by mouth daily.), Disp: 45 tablet, Rfl: 3   mirtazapine (REMERON) 7.5 MG tablet, Take 1 tablet (7.5 mg total) by mouth at bedtime. For anxiety/  depression, Disp: 90 tablet, Rfl: 0   omeprazole (PRILOSEC) 20 MG capsule, TAKE 1 CAPSULE BY MOUTH  DAILY (Patient taking differently: Take 20 mg by mouth daily.), Disp: 90 capsule, Rfl: 3   ondansetron (ZOFRAN ODT) 4 MG disintegrating tablet, Take 1 tablet (4 mg total) by mouth every 8 (eight) hours as needed for nausea or vomiting., Disp: 10 tablet, Rfl: 0   ONETOUCH VERIO test strip, TEST BLOOD SUGARS DAILY DX E11.9, Disp: 100 strip, Rfl: 3   rosuvastatin (CRESTOR) 40 MG tablet, Take 1 tablet (40 mg total) by mouth daily. To REPLACE Atorvastatin (Patient taking differently: Take 40 mg by mouth daily.), Disp: 90 tablet, Rfl: 3   sacubitril-valsartan (ENTRESTO) 24-26 MG, Take 1 tablet by mouth 2 (two) times daily., Disp: 180 tablet, Rfl: 3   spironolactone (ALDACTONE) 25 MG tablet, Take 0.5 tablets (12.5 mg total) by mouth daily., Disp: 45 tablet, Rfl: 1   apixaban (ELIQUIS) 5 MG TABS tablet, Take 1 tablet (5 mg total) by mouth 2 (two) times daily. (HOLDING CURRENTLY DUE TO HEMATURIA) 08/06/2021 (Patient not taking: Reported on 08/27/2021), Disp: , Rfl:   Allergies  Allergen Reactions   Penicillins Other (See Comments)    Unknown   Tramadol Other (See Comments)    Dizzy   Ketorolac Tromethamine Rash    Objective:   BP (!) 108/59    Pulse 68    Temp (!) 97.1 F (36.2 C) (Temporal)    Ht 5\' 11"  (1.803 m)    Wt 185 lb 6.4 oz (84.1 kg)    SpO2 97%    BMI 25.86 kg/m    Physical Exam Vitals reviewed.  Constitutional:      General: He is not in acute distress.    Appearance: Normal appearance. He is not ill-appearing, toxic-appearing or diaphoretic.  HENT:     Head: Normocephalic and atraumatic.  Eyes:     General: No scleral icterus.       Right eye: No discharge.        Left eye: No discharge.     Conjunctiva/sclera: Conjunctivae normal.  Cardiovascular:     Rate and Rhythm: Normal rate.  Pulmonary:     Effort: Pulmonary effort is normal. No respiratory distress.  Musculoskeletal:         General: Normal range of motion.     Cervical back: Normal range of motion.  Skin:    General: Skin is warm and dry.  Neurological:     Mental Status: He is alert and oriented to person, place, and time. Mental status is at baseline.     Motor: Weakness present.     Gait: Gait abnormal (ambulates with walker).  Psychiatric:        Mood and Affect: Mood normal.        Behavior: Behavior normal.        Thought Content: Thought content normal.  Judgment: Judgment normal.

## 2021-08-27 NOTE — Telephone Encounter (Signed)
I don't think they can be cut in half but he can check with pharmacist to make sure. Will cc Almyra Free to see if she knows.

## 2021-08-28 DIAGNOSIS — R310 Gross hematuria: Secondary | ICD-10-CM | POA: Diagnosis not present

## 2021-08-28 DIAGNOSIS — N200 Calculus of kidney: Secondary | ICD-10-CM | POA: Diagnosis not present

## 2021-08-28 DIAGNOSIS — N281 Cyst of kidney, acquired: Secondary | ICD-10-CM | POA: Diagnosis not present

## 2021-08-28 NOTE — Progress Notes (Signed)
Remote pacemaker transmission.   

## 2021-08-29 ENCOUNTER — Ambulatory Visit (HOSPITAL_COMMUNITY): Payer: Medicare Other

## 2021-09-02 ENCOUNTER — Telehealth: Payer: Self-pay | Admitting: *Deleted

## 2021-09-02 NOTE — Telephone Encounter (Signed)
Received fax from [employer] - Entresto approved until 08/24/2022 at no cost to patient.

## 2021-09-03 DIAGNOSIS — Z87891 Personal history of nicotine dependence: Secondary | ICD-10-CM | POA: Diagnosis not present

## 2021-09-03 DIAGNOSIS — R531 Weakness: Secondary | ICD-10-CM | POA: Diagnosis not present

## 2021-09-03 DIAGNOSIS — Z9181 History of falling: Secondary | ICD-10-CM | POA: Diagnosis not present

## 2021-09-03 DIAGNOSIS — I10 Essential (primary) hypertension: Secondary | ICD-10-CM | POA: Diagnosis not present

## 2021-09-03 DIAGNOSIS — N200 Calculus of kidney: Secondary | ICD-10-CM | POA: Diagnosis not present

## 2021-09-03 DIAGNOSIS — Z950 Presence of cardiac pacemaker: Secondary | ICD-10-CM | POA: Diagnosis not present

## 2021-09-03 DIAGNOSIS — R319 Hematuria, unspecified: Secondary | ICD-10-CM | POA: Diagnosis not present

## 2021-09-03 DIAGNOSIS — E119 Type 2 diabetes mellitus without complications: Secondary | ICD-10-CM | POA: Diagnosis not present

## 2021-09-03 DIAGNOSIS — J449 Chronic obstructive pulmonary disease, unspecified: Secondary | ICD-10-CM | POA: Diagnosis not present

## 2021-09-04 ENCOUNTER — Ambulatory Visit: Payer: Medicare Other | Admitting: Urology

## 2021-09-04 ENCOUNTER — Telehealth: Payer: Self-pay | Admitting: *Deleted

## 2021-09-04 NOTE — Telephone Encounter (Signed)
Received fax from [facility] - patient eligible to receive Eliquis free of charge from 09/03/2021 through 08/24/2022.

## 2021-09-05 ENCOUNTER — Ambulatory Visit: Payer: Medicare Other | Admitting: Urology

## 2021-09-05 DIAGNOSIS — R319 Hematuria, unspecified: Secondary | ICD-10-CM | POA: Diagnosis not present

## 2021-09-05 DIAGNOSIS — J449 Chronic obstructive pulmonary disease, unspecified: Secondary | ICD-10-CM | POA: Diagnosis not present

## 2021-09-05 DIAGNOSIS — I10 Essential (primary) hypertension: Secondary | ICD-10-CM | POA: Diagnosis not present

## 2021-09-05 DIAGNOSIS — Z87891 Personal history of nicotine dependence: Secondary | ICD-10-CM | POA: Diagnosis not present

## 2021-09-05 DIAGNOSIS — E119 Type 2 diabetes mellitus without complications: Secondary | ICD-10-CM | POA: Diagnosis not present

## 2021-09-05 DIAGNOSIS — Z9181 History of falling: Secondary | ICD-10-CM | POA: Diagnosis not present

## 2021-09-05 DIAGNOSIS — Z950 Presence of cardiac pacemaker: Secondary | ICD-10-CM | POA: Diagnosis not present

## 2021-09-05 DIAGNOSIS — R531 Weakness: Secondary | ICD-10-CM | POA: Diagnosis not present

## 2021-09-05 DIAGNOSIS — N200 Calculus of kidney: Secondary | ICD-10-CM | POA: Diagnosis not present

## 2021-09-05 NOTE — Telephone Encounter (Signed)
You can cut in half (unless it were ODT tabs--may break up into little pieces)

## 2021-09-06 NOTE — Telephone Encounter (Signed)
Ok to cut in half.

## 2021-09-06 NOTE — Telephone Encounter (Signed)
Daughter aware by phone

## 2021-09-09 DIAGNOSIS — I10 Essential (primary) hypertension: Secondary | ICD-10-CM | POA: Diagnosis not present

## 2021-09-09 DIAGNOSIS — Z87891 Personal history of nicotine dependence: Secondary | ICD-10-CM | POA: Diagnosis not present

## 2021-09-09 DIAGNOSIS — Z9181 History of falling: Secondary | ICD-10-CM | POA: Diagnosis not present

## 2021-09-09 DIAGNOSIS — E119 Type 2 diabetes mellitus without complications: Secondary | ICD-10-CM | POA: Diagnosis not present

## 2021-09-09 DIAGNOSIS — R531 Weakness: Secondary | ICD-10-CM | POA: Diagnosis not present

## 2021-09-09 DIAGNOSIS — N200 Calculus of kidney: Secondary | ICD-10-CM | POA: Diagnosis not present

## 2021-09-09 DIAGNOSIS — R319 Hematuria, unspecified: Secondary | ICD-10-CM | POA: Diagnosis not present

## 2021-09-09 DIAGNOSIS — J449 Chronic obstructive pulmonary disease, unspecified: Secondary | ICD-10-CM | POA: Diagnosis not present

## 2021-09-09 DIAGNOSIS — Z950 Presence of cardiac pacemaker: Secondary | ICD-10-CM | POA: Diagnosis not present

## 2021-09-11 DIAGNOSIS — R531 Weakness: Secondary | ICD-10-CM | POA: Diagnosis not present

## 2021-09-11 DIAGNOSIS — J449 Chronic obstructive pulmonary disease, unspecified: Secondary | ICD-10-CM | POA: Diagnosis not present

## 2021-09-11 DIAGNOSIS — Z9181 History of falling: Secondary | ICD-10-CM | POA: Diagnosis not present

## 2021-09-11 DIAGNOSIS — R319 Hematuria, unspecified: Secondary | ICD-10-CM | POA: Diagnosis not present

## 2021-09-11 DIAGNOSIS — N200 Calculus of kidney: Secondary | ICD-10-CM | POA: Diagnosis not present

## 2021-09-11 DIAGNOSIS — Z87891 Personal history of nicotine dependence: Secondary | ICD-10-CM | POA: Diagnosis not present

## 2021-09-11 DIAGNOSIS — Z950 Presence of cardiac pacemaker: Secondary | ICD-10-CM | POA: Diagnosis not present

## 2021-09-11 DIAGNOSIS — E119 Type 2 diabetes mellitus without complications: Secondary | ICD-10-CM | POA: Diagnosis not present

## 2021-09-11 DIAGNOSIS — I10 Essential (primary) hypertension: Secondary | ICD-10-CM | POA: Diagnosis not present

## 2021-09-16 ENCOUNTER — Telehealth: Payer: Self-pay | Admitting: Cardiology

## 2021-09-16 ENCOUNTER — Other Ambulatory Visit: Payer: Self-pay | Admitting: Cardiology

## 2021-09-16 DIAGNOSIS — N200 Calculus of kidney: Secondary | ICD-10-CM | POA: Diagnosis not present

## 2021-09-16 DIAGNOSIS — Z9181 History of falling: Secondary | ICD-10-CM | POA: Diagnosis not present

## 2021-09-16 DIAGNOSIS — E119 Type 2 diabetes mellitus without complications: Secondary | ICD-10-CM | POA: Diagnosis not present

## 2021-09-16 DIAGNOSIS — R319 Hematuria, unspecified: Secondary | ICD-10-CM | POA: Diagnosis not present

## 2021-09-16 DIAGNOSIS — Z950 Presence of cardiac pacemaker: Secondary | ICD-10-CM | POA: Diagnosis not present

## 2021-09-16 DIAGNOSIS — Z87891 Personal history of nicotine dependence: Secondary | ICD-10-CM | POA: Diagnosis not present

## 2021-09-16 DIAGNOSIS — R531 Weakness: Secondary | ICD-10-CM | POA: Diagnosis not present

## 2021-09-16 DIAGNOSIS — J449 Chronic obstructive pulmonary disease, unspecified: Secondary | ICD-10-CM | POA: Diagnosis not present

## 2021-09-16 DIAGNOSIS — I10 Essential (primary) hypertension: Secondary | ICD-10-CM | POA: Diagnosis not present

## 2021-09-16 NOTE — Telephone Encounter (Signed)
Daughter of the patient called. She wanted to know what the next step is in regards to the patient's [facility] application

## 2021-09-17 ENCOUNTER — Ambulatory Visit (INDEPENDENT_AMBULATORY_CARE_PROVIDER_SITE_OTHER): Payer: Medicare Other

## 2021-09-17 ENCOUNTER — Other Ambulatory Visit: Payer: Self-pay

## 2021-09-17 DIAGNOSIS — Z9181 History of falling: Secondary | ICD-10-CM

## 2021-09-17 DIAGNOSIS — Z7901 Long term (current) use of anticoagulants: Secondary | ICD-10-CM | POA: Diagnosis not present

## 2021-09-17 DIAGNOSIS — J449 Chronic obstructive pulmonary disease, unspecified: Secondary | ICD-10-CM | POA: Diagnosis not present

## 2021-09-17 DIAGNOSIS — I10 Essential (primary) hypertension: Secondary | ICD-10-CM

## 2021-09-17 DIAGNOSIS — Z950 Presence of cardiac pacemaker: Secondary | ICD-10-CM | POA: Diagnosis not present

## 2021-09-17 DIAGNOSIS — R319 Hematuria, unspecified: Secondary | ICD-10-CM

## 2021-09-17 DIAGNOSIS — E669 Obesity, unspecified: Secondary | ICD-10-CM

## 2021-09-17 DIAGNOSIS — R531 Weakness: Secondary | ICD-10-CM

## 2021-09-17 DIAGNOSIS — Z87891 Personal history of nicotine dependence: Secondary | ICD-10-CM

## 2021-09-17 DIAGNOSIS — E119 Type 2 diabetes mellitus without complications: Secondary | ICD-10-CM | POA: Diagnosis not present

## 2021-09-17 DIAGNOSIS — N200 Calculus of kidney: Secondary | ICD-10-CM

## 2021-09-17 MED ORDER — SACUBITRIL-VALSARTAN 24-26 MG PO TABS: 1.0000 | ORAL_TABLET | Freq: Two times a day (BID) | ORAL | 3 refills | Status: DC

## 2021-09-17 NOTE — Telephone Encounter (Signed)
Per daughter Annamary Carolin prescription has to be sent to [facility]. Advised that new prescription would be sent.

## 2021-09-18 DIAGNOSIS — Z9181 History of falling: Secondary | ICD-10-CM | POA: Diagnosis not present

## 2021-09-18 DIAGNOSIS — Z87891 Personal history of nicotine dependence: Secondary | ICD-10-CM | POA: Diagnosis not present

## 2021-09-18 DIAGNOSIS — J449 Chronic obstructive pulmonary disease, unspecified: Secondary | ICD-10-CM | POA: Diagnosis not present

## 2021-09-18 DIAGNOSIS — N200 Calculus of kidney: Secondary | ICD-10-CM | POA: Diagnosis not present

## 2021-09-18 DIAGNOSIS — I10 Essential (primary) hypertension: Secondary | ICD-10-CM | POA: Diagnosis not present

## 2021-09-18 DIAGNOSIS — E119 Type 2 diabetes mellitus without complications: Secondary | ICD-10-CM | POA: Diagnosis not present

## 2021-09-18 DIAGNOSIS — Z950 Presence of cardiac pacemaker: Secondary | ICD-10-CM | POA: Diagnosis not present

## 2021-09-18 DIAGNOSIS — R531 Weakness: Secondary | ICD-10-CM | POA: Diagnosis not present

## 2021-09-18 DIAGNOSIS — R319 Hematuria, unspecified: Secondary | ICD-10-CM | POA: Diagnosis not present

## 2021-09-25 DIAGNOSIS — I10 Essential (primary) hypertension: Secondary | ICD-10-CM | POA: Diagnosis not present

## 2021-09-25 DIAGNOSIS — N200 Calculus of kidney: Secondary | ICD-10-CM | POA: Diagnosis not present

## 2021-09-25 DIAGNOSIS — J449 Chronic obstructive pulmonary disease, unspecified: Secondary | ICD-10-CM | POA: Diagnosis not present

## 2021-09-25 DIAGNOSIS — R319 Hematuria, unspecified: Secondary | ICD-10-CM | POA: Diagnosis not present

## 2021-09-25 DIAGNOSIS — R531 Weakness: Secondary | ICD-10-CM | POA: Diagnosis not present

## 2021-09-25 DIAGNOSIS — Z950 Presence of cardiac pacemaker: Secondary | ICD-10-CM | POA: Diagnosis not present

## 2021-09-25 DIAGNOSIS — Z87891 Personal history of nicotine dependence: Secondary | ICD-10-CM | POA: Diagnosis not present

## 2021-09-25 DIAGNOSIS — E119 Type 2 diabetes mellitus without complications: Secondary | ICD-10-CM | POA: Diagnosis not present

## 2021-09-25 DIAGNOSIS — Z9181 History of falling: Secondary | ICD-10-CM | POA: Diagnosis not present

## 2021-09-30 DIAGNOSIS — E119 Type 2 diabetes mellitus without complications: Secondary | ICD-10-CM | POA: Diagnosis not present

## 2021-09-30 DIAGNOSIS — N200 Calculus of kidney: Secondary | ICD-10-CM | POA: Diagnosis not present

## 2021-09-30 DIAGNOSIS — Z87891 Personal history of nicotine dependence: Secondary | ICD-10-CM | POA: Diagnosis not present

## 2021-09-30 DIAGNOSIS — R319 Hematuria, unspecified: Secondary | ICD-10-CM | POA: Diagnosis not present

## 2021-09-30 DIAGNOSIS — Z950 Presence of cardiac pacemaker: Secondary | ICD-10-CM | POA: Diagnosis not present

## 2021-09-30 DIAGNOSIS — R531 Weakness: Secondary | ICD-10-CM | POA: Diagnosis not present

## 2021-09-30 DIAGNOSIS — J449 Chronic obstructive pulmonary disease, unspecified: Secondary | ICD-10-CM | POA: Diagnosis not present

## 2021-09-30 DIAGNOSIS — I10 Essential (primary) hypertension: Secondary | ICD-10-CM | POA: Diagnosis not present

## 2021-09-30 DIAGNOSIS — Z9181 History of falling: Secondary | ICD-10-CM | POA: Diagnosis not present

## 2021-09-30 NOTE — Telephone Encounter (Signed)
Daughter of the patient called. She said she called the company and held for an hour without an answer. She called another company and was told that no rx had been sent in.  She does not have a fax number. She is not sure what else to do. She does not want the patient to run out of medication
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
## 2021-10-09 ENCOUNTER — Other Ambulatory Visit: Payer: Self-pay | Admitting: Family Medicine

## 2021-10-09 DIAGNOSIS — K219 Gastro-esophageal reflux disease without esophagitis: Secondary | ICD-10-CM

## 2021-10-10 DIAGNOSIS — N200 Calculus of kidney: Secondary | ICD-10-CM | POA: Diagnosis not present

## 2021-10-10 DIAGNOSIS — Z9181 History of falling: Secondary | ICD-10-CM | POA: Diagnosis not present

## 2021-10-10 DIAGNOSIS — R531 Weakness: Secondary | ICD-10-CM | POA: Diagnosis not present

## 2021-10-10 DIAGNOSIS — R319 Hematuria, unspecified: Secondary | ICD-10-CM | POA: Diagnosis not present

## 2021-10-10 DIAGNOSIS — Z87891 Personal history of nicotine dependence: Secondary | ICD-10-CM | POA: Diagnosis not present

## 2021-10-10 DIAGNOSIS — J449 Chronic obstructive pulmonary disease, unspecified: Secondary | ICD-10-CM | POA: Diagnosis not present

## 2021-10-10 DIAGNOSIS — I10 Essential (primary) hypertension: Secondary | ICD-10-CM | POA: Diagnosis not present

## 2021-10-10 DIAGNOSIS — Z950 Presence of cardiac pacemaker: Secondary | ICD-10-CM | POA: Diagnosis not present

## 2021-10-10 DIAGNOSIS — E119 Type 2 diabetes mellitus without complications: Secondary | ICD-10-CM | POA: Diagnosis not present

## 2021-10-14 ENCOUNTER — Other Ambulatory Visit: Payer: Self-pay | Admitting: Cardiology

## 2021-10-14 ENCOUNTER — Other Ambulatory Visit: Payer: Self-pay | Admitting: Family Medicine

## 2021-10-14 DIAGNOSIS — E1142 Type 2 diabetes mellitus with diabetic polyneuropathy: Secondary | ICD-10-CM

## 2021-10-15 ENCOUNTER — Ambulatory Visit (INDEPENDENT_AMBULATORY_CARE_PROVIDER_SITE_OTHER): Payer: Medicare Other | Admitting: Family Medicine

## 2021-10-15 ENCOUNTER — Encounter: Payer: Self-pay | Admitting: Family Medicine

## 2021-10-15 VITALS — BP 107/56 | HR 82 | Temp 98.0°F | Ht 71.0 in | Wt 183.2 lb

## 2021-10-15 DIAGNOSIS — J3489 Other specified disorders of nose and nasal sinuses: Secondary | ICD-10-CM | POA: Diagnosis not present

## 2021-10-15 DIAGNOSIS — C61 Malignant neoplasm of prostate: Secondary | ICD-10-CM

## 2021-10-15 DIAGNOSIS — N179 Acute kidney failure, unspecified: Secondary | ICD-10-CM | POA: Diagnosis not present

## 2021-10-15 DIAGNOSIS — E1169 Type 2 diabetes mellitus with other specified complication: Secondary | ICD-10-CM

## 2021-10-15 DIAGNOSIS — N1832 Chronic kidney disease, stage 3b: Secondary | ICD-10-CM | POA: Diagnosis not present

## 2021-10-15 DIAGNOSIS — N1831 Chronic kidney disease, stage 3a: Secondary | ICD-10-CM | POA: Diagnosis not present

## 2021-10-15 DIAGNOSIS — E1142 Type 2 diabetes mellitus with diabetic polyneuropathy: Secondary | ICD-10-CM | POA: Diagnosis not present

## 2021-10-15 DIAGNOSIS — E785 Hyperlipidemia, unspecified: Secondary | ICD-10-CM | POA: Diagnosis not present

## 2021-10-15 DIAGNOSIS — F418 Other specified anxiety disorders: Secondary | ICD-10-CM

## 2021-10-15 LAB — BAYER DCA HB A1C WAIVED: HB A1C (BAYER DCA - WAIVED): 5.7 % — ABNORMAL HIGH (ref 4.8–5.6)

## 2021-10-15 MED ORDER — METFORMIN HCL 500 MG PO TABS: 500.0000 mg | ORAL_TABLET | Freq: Every day | ORAL | 3 refills | Status: DC

## 2021-10-15 MED ORDER — AZELASTINE HCL 0.1 % NA SOLN: 1.0000 | Freq: Two times a day (BID) | NASAL | 12 refills | Status: DC

## 2021-10-15 MED ORDER — DESVENLAFAXINE SUCCINATE ER 25 MG PO TB24: 25.0000 mg | ORAL_TABLET | Freq: Every day | ORAL | 1 refills | Status: DC
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
## 2021-10-15 NOTE — Patient Instructions (Signed)
Cut back on the Metformin to 1 tablet daily.  Sugar is too low today. Stop Mirtazapine START Pristiq (25mg  is the lowest does it comes in, should not cause sleepiness) START Astelin nasal spray for runny nose

## 2021-10-16 LAB — LIPID PANEL
Chol/HDL Ratio: 3.1 ratio (ref 0.0–5.0)
Cholesterol, Total: 97 mg/dL — ABNORMAL LOW (ref 100–199)
HDL: 31 mg/dL — ABNORMAL LOW (ref >39)
LDL Chol Calc (NIH): 45 mg/dL (ref 0–99)
Triglycerides: 114 mg/dL (ref 0–149)
VLDL Cholesterol Cal: 21 mg/dL (ref 5–40)

## 2021-10-16 LAB — RENAL FUNCTION PANEL
Albumin: 3.8 g/dL (ref 3.6–4.6)
BUN/Creatinine Ratio: 12 (ref 10–24)
BUN: 24 mg/dL (ref 8–27)
CO2: 21 mmol/L (ref 20–29)
Calcium: 10.3 mg/dL — ABNORMAL HIGH (ref 8.6–10.2)
Chloride: 106 mmol/L (ref 96–106)
Creatinine, Ser: 1.93 mg/dL — ABNORMAL HIGH (ref 0.76–1.27)
Glucose: 111 mg/dL — ABNORMAL HIGH (ref 70–99)
Phosphorus: 2.6 mg/dL — ABNORMAL LOW (ref 2.8–4.1)
Potassium: 4.9 mmol/L (ref 3.5–5.2)
Sodium: 142 mmol/L (ref 134–144)
eGFR: 33 mL/min/{1.73_m2} — ABNORMAL LOW (ref >59)

## 2021-10-16 LAB — PSA: Prostate Specific Ag, Serum: 6.9 ng/mL — ABNORMAL HIGH (ref 0.0–4.0)

## 2021-10-17 DIAGNOSIS — J449 Chronic obstructive pulmonary disease, unspecified: Secondary | ICD-10-CM | POA: Diagnosis not present

## 2021-10-17 DIAGNOSIS — R319 Hematuria, unspecified: Secondary | ICD-10-CM | POA: Diagnosis not present

## 2021-10-17 DIAGNOSIS — Z87891 Personal history of nicotine dependence: Secondary | ICD-10-CM | POA: Diagnosis not present

## 2021-10-17 DIAGNOSIS — I10 Essential (primary) hypertension: Secondary | ICD-10-CM | POA: Diagnosis not present

## 2021-10-17 DIAGNOSIS — E119 Type 2 diabetes mellitus without complications: Secondary | ICD-10-CM | POA: Diagnosis not present

## 2021-10-17 DIAGNOSIS — R531 Weakness: Secondary | ICD-10-CM | POA: Diagnosis not present

## 2021-10-17 DIAGNOSIS — N200 Calculus of kidney: Secondary | ICD-10-CM | POA: Diagnosis not present

## 2021-10-17 DIAGNOSIS — Z950 Presence of cardiac pacemaker: Secondary | ICD-10-CM | POA: Diagnosis not present

## 2021-10-17 DIAGNOSIS — Z9181 History of falling: Secondary | ICD-10-CM | POA: Diagnosis not present

## 2021-10-22 ENCOUNTER — Telehealth: Payer: Medicare Other

## 2021-10-22 ENCOUNTER — Telehealth: Payer: Self-pay | Admitting: Licensed Clinical Social Worker

## 2021-10-22 NOTE — Telephone Encounter (Signed)
°  Chronic Care Management     Clinical Social Work Note   10/22/21 Name: [name]         MRN: [id number]       DOB: [birth year]   PRITESH SOBECKI is a 88 y.o. year old male who is a primary care patient of [name], DO. The CCM team was consulted to assist the patient with chronic disease management and/or care coordination needs related to: [name] .    [name] not able to contact client today via phone.  [name] not able to leave phone message for client  Follow Up Plan:  [name] to call client on 12/16/21 at 9:00 AM to assess client needs at that time  [name].Niccolo Burggraf MSW, [place] representative [facility] Care Management [phone]

## 2021-10-23 DIAGNOSIS — R319 Hematuria, unspecified: Secondary | ICD-10-CM | POA: Diagnosis not present

## 2021-10-23 DIAGNOSIS — J449 Chronic obstructive pulmonary disease, unspecified: Secondary | ICD-10-CM | POA: Diagnosis not present

## 2021-10-23 DIAGNOSIS — Z87891 Personal history of nicotine dependence: Secondary | ICD-10-CM | POA: Diagnosis not present

## 2021-10-23 DIAGNOSIS — N200 Calculus of kidney: Secondary | ICD-10-CM | POA: Diagnosis not present

## 2021-10-23 DIAGNOSIS — I10 Essential (primary) hypertension: Secondary | ICD-10-CM | POA: Diagnosis not present

## 2021-10-23 DIAGNOSIS — Z9181 History of falling: Secondary | ICD-10-CM | POA: Diagnosis not present

## 2021-10-23 DIAGNOSIS — R531 Weakness: Secondary | ICD-10-CM | POA: Diagnosis not present

## 2021-10-23 DIAGNOSIS — E119 Type 2 diabetes mellitus without complications: Secondary | ICD-10-CM | POA: Diagnosis not present

## 2021-10-23 DIAGNOSIS — Z950 Presence of cardiac pacemaker: Secondary | ICD-10-CM | POA: Diagnosis not present

## 2021-10-30 DIAGNOSIS — I10 Essential (primary) hypertension: Secondary | ICD-10-CM | POA: Diagnosis not present

## 2021-10-30 DIAGNOSIS — E119 Type 2 diabetes mellitus without complications: Secondary | ICD-10-CM | POA: Diagnosis not present

## 2021-10-30 DIAGNOSIS — J449 Chronic obstructive pulmonary disease, unspecified: Secondary | ICD-10-CM | POA: Diagnosis not present

## 2021-10-30 DIAGNOSIS — R531 Weakness: Secondary | ICD-10-CM | POA: Diagnosis not present

## 2021-10-30 DIAGNOSIS — Z950 Presence of cardiac pacemaker: Secondary | ICD-10-CM | POA: Diagnosis not present

## 2021-10-30 DIAGNOSIS — R319 Hematuria, unspecified: Secondary | ICD-10-CM | POA: Diagnosis not present

## 2021-10-30 DIAGNOSIS — Z87891 Personal history of nicotine dependence: Secondary | ICD-10-CM | POA: Diagnosis not present

## 2021-10-30 DIAGNOSIS — N200 Calculus of kidney: Secondary | ICD-10-CM | POA: Diagnosis not present

## 2021-10-30 DIAGNOSIS — Z9181 History of falling: Secondary | ICD-10-CM | POA: Diagnosis not present

## 2021-11-04 ENCOUNTER — Other Ambulatory Visit: Payer: Self-pay | Admitting: Family Medicine

## 2021-11-04 DIAGNOSIS — F418 Other specified anxiety disorders: Secondary | ICD-10-CM

## 2021-11-06 DIAGNOSIS — E119 Type 2 diabetes mellitus without complications: Secondary | ICD-10-CM | POA: Diagnosis not present

## 2021-11-06 DIAGNOSIS — R319 Hematuria, unspecified: Secondary | ICD-10-CM | POA: Diagnosis not present

## 2021-11-06 DIAGNOSIS — R531 Weakness: Secondary | ICD-10-CM | POA: Diagnosis not present

## 2021-11-06 DIAGNOSIS — I10 Essential (primary) hypertension: Secondary | ICD-10-CM | POA: Diagnosis not present

## 2021-11-06 DIAGNOSIS — N200 Calculus of kidney: Secondary | ICD-10-CM | POA: Diagnosis not present

## 2021-11-06 DIAGNOSIS — I4891 Unspecified atrial fibrillation: Secondary | ICD-10-CM | POA: Diagnosis not present

## 2021-11-06 DIAGNOSIS — Z87891 Personal history of nicotine dependence: Secondary | ICD-10-CM | POA: Diagnosis not present

## 2021-11-06 DIAGNOSIS — Z9181 History of falling: Secondary | ICD-10-CM | POA: Diagnosis not present

## 2021-11-06 DIAGNOSIS — J449 Chronic obstructive pulmonary disease, unspecified: Secondary | ICD-10-CM | POA: Diagnosis not present

## 2021-11-13 DIAGNOSIS — Z9181 History of falling: Secondary | ICD-10-CM | POA: Diagnosis not present

## 2021-11-13 DIAGNOSIS — R319 Hematuria, unspecified: Secondary | ICD-10-CM | POA: Diagnosis not present

## 2021-11-13 DIAGNOSIS — E119 Type 2 diabetes mellitus without complications: Secondary | ICD-10-CM | POA: Diagnosis not present

## 2021-11-13 DIAGNOSIS — I10 Essential (primary) hypertension: Secondary | ICD-10-CM | POA: Diagnosis not present

## 2021-11-13 DIAGNOSIS — I4891 Unspecified atrial fibrillation: Secondary | ICD-10-CM | POA: Diagnosis not present

## 2021-11-13 DIAGNOSIS — R531 Weakness: Secondary | ICD-10-CM | POA: Diagnosis not present

## 2021-11-13 DIAGNOSIS — Z87891 Personal history of nicotine dependence: Secondary | ICD-10-CM | POA: Diagnosis not present

## 2021-11-13 DIAGNOSIS — J449 Chronic obstructive pulmonary disease, unspecified: Secondary | ICD-10-CM | POA: Diagnosis not present

## 2021-11-13 DIAGNOSIS — N200 Calculus of kidney: Secondary | ICD-10-CM | POA: Diagnosis not present

## 2021-11-18 ENCOUNTER — Ambulatory Visit: Payer: Medicare Other | Admitting: Cardiology

## 2021-11-19 ENCOUNTER — Ambulatory Visit (INDEPENDENT_AMBULATORY_CARE_PROVIDER_SITE_OTHER): Payer: Medicare Other

## 2021-11-19 DIAGNOSIS — I441 Atrioventricular block, second degree: Secondary | ICD-10-CM

## 2021-11-19 LAB — CUP PACEART REMOTE DEVICE CHECK
Battery Remaining Longevity: 91 mo
Battery Remaining Percentage: 74 %
Battery Voltage: 3.01 V
Brady Statistic AP VP Percent: 55 %
Brady Statistic AP VS Percent: 1 %
Brady Statistic AS VP Percent: 45 %
Brady Statistic AS VS Percent: 1 %
Brady Statistic RA Percent Paced: 54 %
Brady Statistic RV Percent Paced: 99 %
Date Time Interrogation Session: 20230328020013
Implantable Lead Implant Date: 20200630
Implantable Lead Implant Date: 20200630
Implantable Lead Location: 753859
Implantable Lead Location: 753860
Implantable Pulse Generator Implant Date: 20200630
Lead Channel Impedance Value: 530 Ohm
Lead Channel Impedance Value: 710 Ohm
Lead Channel Pacing Threshold Amplitude: 0.75 V
Lead Channel Pacing Threshold Amplitude: 0.75 V
Lead Channel Pacing Threshold Pulse Width: 0.5 ms
Lead Channel Pacing Threshold Pulse Width: 0.5 ms
Lead Channel Sensing Intrinsic Amplitude: 0.5 mV
Lead Channel Sensing Intrinsic Amplitude: 6.8 mV
Lead Channel Setting Pacing Amplitude: 1 V
Lead Channel Setting Pacing Amplitude: 2 V
Lead Channel Setting Pacing Pulse Width: 0.5 ms
Lead Channel Setting Sensing Sensitivity: 4 mV
Pulse Gen Model: 2272
Pulse Gen Serial Number: 3311958

## 2021-11-20 ENCOUNTER — Telehealth: Payer: Self-pay | Admitting: *Deleted

## 2021-11-20 DIAGNOSIS — R319 Hematuria, unspecified: Secondary | ICD-10-CM | POA: Diagnosis not present

## 2021-11-20 DIAGNOSIS — I10 Essential (primary) hypertension: Secondary | ICD-10-CM | POA: Diagnosis not present

## 2021-11-20 DIAGNOSIS — Z9181 History of falling: Secondary | ICD-10-CM | POA: Diagnosis not present

## 2021-11-20 DIAGNOSIS — N200 Calculus of kidney: Secondary | ICD-10-CM | POA: Diagnosis not present

## 2021-11-20 DIAGNOSIS — J449 Chronic obstructive pulmonary disease, unspecified: Secondary | ICD-10-CM | POA: Diagnosis not present

## 2021-11-20 DIAGNOSIS — I4891 Unspecified atrial fibrillation: Secondary | ICD-10-CM | POA: Diagnosis not present

## 2021-11-20 DIAGNOSIS — E119 Type 2 diabetes mellitus without complications: Secondary | ICD-10-CM | POA: Diagnosis not present

## 2021-11-20 DIAGNOSIS — R531 Weakness: Secondary | ICD-10-CM | POA: Diagnosis not present

## 2021-11-20 DIAGNOSIS — Z87891 Personal history of nicotine dependence: Secondary | ICD-10-CM | POA: Diagnosis not present

## 2021-11-20 NOTE — Telephone Encounter (Signed)
FYIJoellen Jersey PT w/ [facility] ?Calling to report pt had a fall Fri evening. Pt hit his head, no major injury or pain today. Has 2 scraps on top of his head and a small one on his R knee. Pt & wife report no confusion after fall, did not seek medication attention after fall. ?Walked w/ PT today, had a little discomfort w/ the R knee. ?

## 2021-11-26 ENCOUNTER — Ambulatory Visit (INDEPENDENT_AMBULATORY_CARE_PROVIDER_SITE_OTHER): Payer: Medicare Other

## 2021-11-26 DIAGNOSIS — I10 Essential (primary) hypertension: Secondary | ICD-10-CM

## 2021-11-26 DIAGNOSIS — Z7901 Long term (current) use of anticoagulants: Secondary | ICD-10-CM

## 2021-11-26 DIAGNOSIS — E119 Type 2 diabetes mellitus without complications: Secondary | ICD-10-CM | POA: Diagnosis not present

## 2021-11-26 DIAGNOSIS — J449 Chronic obstructive pulmonary disease, unspecified: Secondary | ICD-10-CM | POA: Diagnosis not present

## 2021-11-26 DIAGNOSIS — Z9181 History of falling: Secondary | ICD-10-CM | POA: Diagnosis not present

## 2021-11-26 DIAGNOSIS — Z950 Presence of cardiac pacemaker: Secondary | ICD-10-CM

## 2021-11-26 DIAGNOSIS — R319 Hematuria, unspecified: Secondary | ICD-10-CM | POA: Diagnosis not present

## 2021-11-26 DIAGNOSIS — Z87891 Personal history of nicotine dependence: Secondary | ICD-10-CM

## 2021-11-26 DIAGNOSIS — E785 Hyperlipidemia, unspecified: Secondary | ICD-10-CM

## 2021-11-26 DIAGNOSIS — Z7984 Long term (current) use of oral hypoglycemic drugs: Secondary | ICD-10-CM

## 2021-11-26 DIAGNOSIS — Z8673 Personal history of transient ischemic attack (TIA), and cerebral infarction without residual deficits: Secondary | ICD-10-CM

## 2021-11-26 DIAGNOSIS — N200 Calculus of kidney: Secondary | ICD-10-CM

## 2021-11-26 DIAGNOSIS — I4891 Unspecified atrial fibrillation: Secondary | ICD-10-CM | POA: Diagnosis not present

## 2021-11-26 DIAGNOSIS — R531 Weakness: Secondary | ICD-10-CM | POA: Diagnosis not present

## 2021-11-26 DIAGNOSIS — E669 Obesity, unspecified: Secondary | ICD-10-CM

## 2021-11-29 NOTE — Progress Notes (Signed)
Remote pacemaker transmission.   

## 2021-11-30 DIAGNOSIS — Z9181 History of falling: Secondary | ICD-10-CM | POA: Diagnosis not present

## 2021-11-30 DIAGNOSIS — R531 Weakness: Secondary | ICD-10-CM | POA: Diagnosis not present

## 2021-11-30 DIAGNOSIS — R319 Hematuria, unspecified: Secondary | ICD-10-CM | POA: Diagnosis not present

## 2021-11-30 DIAGNOSIS — E119 Type 2 diabetes mellitus without complications: Secondary | ICD-10-CM | POA: Diagnosis not present

## 2021-11-30 DIAGNOSIS — I10 Essential (primary) hypertension: Secondary | ICD-10-CM | POA: Diagnosis not present

## 2021-11-30 DIAGNOSIS — J449 Chronic obstructive pulmonary disease, unspecified: Secondary | ICD-10-CM | POA: Diagnosis not present

## 2021-11-30 DIAGNOSIS — Z87891 Personal history of nicotine dependence: Secondary | ICD-10-CM | POA: Diagnosis not present

## 2021-11-30 DIAGNOSIS — N200 Calculus of kidney: Secondary | ICD-10-CM | POA: Diagnosis not present

## 2021-11-30 DIAGNOSIS — I4891 Unspecified atrial fibrillation: Secondary | ICD-10-CM | POA: Diagnosis not present

## 2021-12-04 DIAGNOSIS — R319 Hematuria, unspecified: Secondary | ICD-10-CM | POA: Diagnosis not present

## 2021-12-04 DIAGNOSIS — E119 Type 2 diabetes mellitus without complications: Secondary | ICD-10-CM | POA: Diagnosis not present

## 2021-12-04 DIAGNOSIS — J449 Chronic obstructive pulmonary disease, unspecified: Secondary | ICD-10-CM | POA: Diagnosis not present

## 2021-12-04 DIAGNOSIS — I10 Essential (primary) hypertension: Secondary | ICD-10-CM | POA: Diagnosis not present

## 2021-12-04 DIAGNOSIS — Z87891 Personal history of nicotine dependence: Secondary | ICD-10-CM | POA: Diagnosis not present

## 2021-12-04 DIAGNOSIS — Z9181 History of falling: Secondary | ICD-10-CM | POA: Diagnosis not present

## 2021-12-04 DIAGNOSIS — N200 Calculus of kidney: Secondary | ICD-10-CM | POA: Diagnosis not present

## 2021-12-04 DIAGNOSIS — R531 Weakness: Secondary | ICD-10-CM | POA: Diagnosis not present

## 2021-12-04 DIAGNOSIS — I4891 Unspecified atrial fibrillation: Secondary | ICD-10-CM | POA: Diagnosis not present

## 2021-12-09 ENCOUNTER — Other Ambulatory Visit: Payer: Self-pay | Admitting: Family Medicine

## 2021-12-09 DIAGNOSIS — F418 Other specified anxiety disorders: Secondary | ICD-10-CM

## 2021-12-09 MED ORDER — DESVENLAFAXINE SUCCINATE ER 25 MG PO TB24: 25.0000 mg | ORAL_TABLET | Freq: Every day | ORAL | 3 refills | Status: DC

## 2021-12-10 ENCOUNTER — Emergency Department (HOSPITAL_COMMUNITY): Payer: Medicare Other

## 2021-12-10 ENCOUNTER — Inpatient Hospital Stay (HOSPITAL_COMMUNITY)
Admission: EM | Admit: 2021-12-10 | Discharge: 2021-12-17 | DRG: 535 | Disposition: A | Discharge status: Skilled Nursing Facility | Payer: Medicare Other | Attending: Surgery | Admitting: Surgery

## 2021-12-10 DIAGNOSIS — I2510 Atherosclerotic heart disease of native coronary artery without angina pectoris: Secondary | ICD-10-CM | POA: Diagnosis present

## 2021-12-10 DIAGNOSIS — D696 Thrombocytopenia, unspecified: Secondary | ICD-10-CM | POA: Diagnosis present

## 2021-12-10 DIAGNOSIS — G4733 Obstructive sleep apnea (adult) (pediatric): Secondary | ICD-10-CM | POA: Diagnosis present

## 2021-12-10 DIAGNOSIS — Z96653 Presence of artificial knee joint, bilateral: Secondary | ICD-10-CM | POA: Diagnosis present

## 2021-12-10 DIAGNOSIS — I5022 Chronic systolic (congestive) heart failure: Secondary | ICD-10-CM | POA: Diagnosis present

## 2021-12-10 DIAGNOSIS — E1169 Type 2 diabetes mellitus with other specified complication: Secondary | ICD-10-CM | POA: Diagnosis present

## 2021-12-10 DIAGNOSIS — Z880 Allergy status to penicillin: Secondary | ICD-10-CM

## 2021-12-10 DIAGNOSIS — Z8679 Personal history of other diseases of the circulatory system: Secondary | ICD-10-CM

## 2021-12-10 DIAGNOSIS — S32501A Unspecified fracture of right pubis, initial encounter for closed fracture: Principal | ICD-10-CM

## 2021-12-10 DIAGNOSIS — Z800 Family history of malignant neoplasm of digestive organs: Secondary | ICD-10-CM

## 2021-12-10 DIAGNOSIS — S12501A Unspecified nondisplaced fracture of sixth cervical vertebra, initial encounter for closed fracture: Secondary | ICD-10-CM | POA: Diagnosis present

## 2021-12-10 DIAGNOSIS — K661 Hemoperitoneum: Secondary | ICD-10-CM | POA: Diagnosis present

## 2021-12-10 DIAGNOSIS — Z885 Allergy status to narcotic agent status: Secondary | ICD-10-CM

## 2021-12-10 DIAGNOSIS — N189 Chronic kidney disease, unspecified: Secondary | ICD-10-CM | POA: Diagnosis present

## 2021-12-10 DIAGNOSIS — Z7901 Long term (current) use of anticoagulants: Secondary | ICD-10-CM

## 2021-12-10 DIAGNOSIS — S12590A Other displaced fracture of sixth cervical vertebra, initial encounter for closed fracture: Secondary | ICD-10-CM | POA: Diagnosis not present

## 2021-12-10 DIAGNOSIS — I130 Hypertensive heart and chronic kidney disease with heart failure and stage 1 through stage 4 chronic kidney disease, or unspecified chronic kidney disease: Secondary | ICD-10-CM | POA: Diagnosis present

## 2021-12-10 DIAGNOSIS — M25561 Pain in right knee: Secondary | ICD-10-CM | POA: Diagnosis present

## 2021-12-10 DIAGNOSIS — Z8673 Personal history of transient ischemic attack (TIA), and cerebral infarction without residual deficits: Secondary | ICD-10-CM

## 2021-12-10 DIAGNOSIS — I723 Aneurysm of iliac artery: Secondary | ICD-10-CM | POA: Diagnosis not present

## 2021-12-10 DIAGNOSIS — Z8546 Personal history of malignant neoplasm of prostate: Secondary | ICD-10-CM

## 2021-12-10 DIAGNOSIS — W1830XA Fall on same level, unspecified, initial encounter: Secondary | ICD-10-CM | POA: Diagnosis not present

## 2021-12-10 DIAGNOSIS — M25551 Pain in right hip: Secondary | ICD-10-CM | POA: Diagnosis not present

## 2021-12-10 DIAGNOSIS — E781 Pure hyperglyceridemia: Secondary | ICD-10-CM | POA: Diagnosis present

## 2021-12-10 DIAGNOSIS — Z888 Allergy status to other drugs, medicaments and biological substances status: Secondary | ICD-10-CM

## 2021-12-10 DIAGNOSIS — Z951 Presence of aortocoronary bypass graft: Secondary | ICD-10-CM

## 2021-12-10 DIAGNOSIS — K219 Gastro-esophageal reflux disease without esophagitis: Secondary | ICD-10-CM | POA: Diagnosis present

## 2021-12-10 DIAGNOSIS — I700 Atherosclerosis of aorta: Secondary | ICD-10-CM | POA: Diagnosis not present

## 2021-12-10 DIAGNOSIS — Z8249 Family history of ischemic heart disease and other diseases of the circulatory system: Secondary | ICD-10-CM

## 2021-12-10 DIAGNOSIS — E785 Hyperlipidemia, unspecified: Secondary | ICD-10-CM | POA: Diagnosis present

## 2021-12-10 DIAGNOSIS — S32591A Other specified fracture of right pubis, initial encounter for closed fracture: Secondary | ICD-10-CM | POA: Diagnosis not present

## 2021-12-10 DIAGNOSIS — I10 Essential (primary) hypertension: Secondary | ICD-10-CM | POA: Diagnosis not present

## 2021-12-10 DIAGNOSIS — R0609 Other forms of dyspnea: Secondary | ICD-10-CM

## 2021-12-10 DIAGNOSIS — Z79899 Other long term (current) drug therapy: Secondary | ICD-10-CM

## 2021-12-10 DIAGNOSIS — Z801 Family history of malignant neoplasm of trachea, bronchus and lung: Secondary | ICD-10-CM

## 2021-12-10 DIAGNOSIS — J449 Chronic obstructive pulmonary disease, unspecified: Secondary | ICD-10-CM | POA: Diagnosis present

## 2021-12-10 DIAGNOSIS — Z7951 Long term (current) use of inhaled steroids: Secondary | ICD-10-CM

## 2021-12-10 DIAGNOSIS — Z85828 Personal history of other malignant neoplasm of skin: Secondary | ICD-10-CM

## 2021-12-10 DIAGNOSIS — Z8042 Family history of malignant neoplasm of prostate: Secondary | ICD-10-CM

## 2021-12-10 DIAGNOSIS — I441 Atrioventricular block, second degree: Secondary | ICD-10-CM | POA: Diagnosis present

## 2021-12-10 DIAGNOSIS — E1122 Type 2 diabetes mellitus with diabetic chronic kidney disease: Secondary | ICD-10-CM | POA: Diagnosis present

## 2021-12-10 DIAGNOSIS — K4090 Unilateral inguinal hernia, without obstruction or gangrene, not specified as recurrent: Secondary | ICD-10-CM | POA: Diagnosis not present

## 2021-12-10 DIAGNOSIS — R600 Localized edema: Secondary | ICD-10-CM

## 2021-12-10 DIAGNOSIS — I6529 Occlusion and stenosis of unspecified carotid artery: Secondary | ICD-10-CM | POA: Diagnosis not present

## 2021-12-10 DIAGNOSIS — I480 Paroxysmal atrial fibrillation: Secondary | ICD-10-CM | POA: Diagnosis present

## 2021-12-10 DIAGNOSIS — Z825 Family history of asthma and other chronic lower respiratory diseases: Secondary | ICD-10-CM

## 2021-12-10 DIAGNOSIS — M542 Cervicalgia: Secondary | ICD-10-CM | POA: Diagnosis not present

## 2021-12-10 DIAGNOSIS — W010XXA Fall on same level from slipping, tripping and stumbling without subsequent striking against object, initial encounter: Secondary | ICD-10-CM | POA: Diagnosis present

## 2021-12-10 DIAGNOSIS — Z950 Presence of cardiac pacemaker: Secondary | ICD-10-CM

## 2021-12-10 DIAGNOSIS — D62 Acute posthemorrhagic anemia: Secondary | ICD-10-CM | POA: Diagnosis present

## 2021-12-10 DIAGNOSIS — I6381 Other cerebral infarction due to occlusion or stenosis of small artery: Secondary | ICD-10-CM | POA: Diagnosis not present

## 2021-12-10 DIAGNOSIS — S12500A Unspecified displaced fracture of sixth cervical vertebra, initial encounter for closed fracture: Secondary | ICD-10-CM | POA: Diagnosis not present

## 2021-12-10 DIAGNOSIS — J358 Other chronic diseases of tonsils and adenoids: Secondary | ICD-10-CM | POA: Diagnosis not present

## 2021-12-10 DIAGNOSIS — M50323 Other cervical disc degeneration at C6-C7 level: Secondary | ICD-10-CM | POA: Diagnosis not present

## 2021-12-10 DIAGNOSIS — S060X0A Concussion without loss of consciousness, initial encounter: Secondary | ICD-10-CM | POA: Diagnosis present

## 2021-12-10 DIAGNOSIS — T1490XA Injury, unspecified, initial encounter: Secondary | ICD-10-CM | POA: Diagnosis present

## 2021-12-10 DIAGNOSIS — I517 Cardiomegaly: Secondary | ICD-10-CM | POA: Diagnosis not present

## 2021-12-10 DIAGNOSIS — Z7984 Long term (current) use of oral hypoglycemic drugs: Secondary | ICD-10-CM

## 2021-12-10 DIAGNOSIS — D6832 Hemorrhagic disorder due to extrinsic circulating anticoagulants: Secondary | ICD-10-CM | POA: Diagnosis present

## 2021-12-10 DIAGNOSIS — M25461 Effusion, right knee: Secondary | ICD-10-CM | POA: Diagnosis present

## 2021-12-10 DIAGNOSIS — Z87891 Personal history of nicotine dependence: Secondary | ICD-10-CM

## 2021-12-10 DIAGNOSIS — S32511A Fracture of superior rim of right pubis, initial encounter for closed fracture: Secondary | ICD-10-CM | POA: Diagnosis not present

## 2021-12-10 DIAGNOSIS — Y92014 Private driveway to single-family (private) house as the place of occurrence of the external cause: Secondary | ICD-10-CM

## 2021-12-10 DIAGNOSIS — R1110 Vomiting, unspecified: Secondary | ICD-10-CM | POA: Diagnosis not present

## 2021-12-10 DIAGNOSIS — Z923 Personal history of irradiation: Secondary | ICD-10-CM

## 2021-12-10 DIAGNOSIS — Z043 Encounter for examination and observation following other accident: Secondary | ICD-10-CM | POA: Diagnosis not present

## 2021-12-10 DIAGNOSIS — T45515A Adverse effect of anticoagulants, initial encounter: Secondary | ICD-10-CM | POA: Diagnosis present

## 2021-12-10 LAB — BASIC METABOLIC PANEL
Anion gap: 9 (ref 5–15)
BUN: 20 mg/dL (ref 8–23)
CO2: 23 mmol/L (ref 22–32)
Calcium: 9.7 mg/dL (ref 8.9–10.3)
Chloride: 108 mmol/L (ref 98–111)
Creatinine, Ser: 1.49 mg/dL — ABNORMAL HIGH (ref 0.61–1.24)
GFR, Estimated: 45 mL/min — ABNORMAL LOW (ref >60)
Glucose, Bld: 179 mg/dL — ABNORMAL HIGH (ref 70–99)
Potassium: 4.4 mmol/L (ref 3.5–5.1)
Sodium: 140 mmol/L (ref 135–145)

## 2021-12-10 LAB — CBC
HCT: 33.6 % — ABNORMAL LOW (ref 39.0–52.0)
Hemoglobin: 10.3 g/dL — ABNORMAL LOW (ref 13.0–17.0)
MCH: 31.3 pg (ref 26.0–34.0)
MCHC: 30.7 g/dL (ref 30.0–36.0)
MCV: 102.1 fL — ABNORMAL HIGH (ref 80.0–100.0)
Platelets: 79 10*3/uL — ABNORMAL LOW (ref 150–400)
RBC: 3.29 MIL/uL — ABNORMAL LOW (ref 4.22–5.81)
RDW: 15.3 % (ref 11.5–15.5)
WBC: 9.1 10*3/uL (ref 4.0–10.5)
nRBC: 0 % (ref 0.0–0.2)

## 2021-12-10 LAB — CBG MONITORING, ED: Glucose-Capillary: 142 mg/dL — ABNORMAL HIGH (ref 70–99)

## 2021-12-10 MED ORDER — SODIUM CHLORIDE 0.9 % IV SOLN: INTRAVENOUS | Status: DC

## 2021-12-10 MED ORDER — AMIODARONE HCL 200 MG PO TABS
200.0000 mg | ORAL_TABLET | Freq: Every day | ORAL | Status: DC
  Administered 2021-12-11 – 2021-12-17 (×7): 200 mg via ORAL
  Filled 2021-12-10 (×7): qty 1

## 2021-12-10 MED ORDER — VENLAFAXINE HCL ER 37.5 MG PO CP24
37.5000 mg | ORAL_CAPSULE | Freq: Every day | ORAL | Status: DC
  Administered 2021-12-11 – 2021-12-17 (×7): 37.5 mg via ORAL
  Filled 2021-12-10 (×8): qty 1

## 2021-12-10 MED ORDER — INSULIN ASPART 100 UNIT/ML IJ SOLN
0.0000 [IU] | INTRAMUSCULAR | Status: DC
  Administered 2021-12-11 – 2021-12-12 (×9): 2 [IU] via SUBCUTANEOUS
  Administered 2021-12-12 – 2021-12-13 (×3): 3 [IU] via SUBCUTANEOUS
  Administered 2021-12-13: 2 [IU] via SUBCUTANEOUS
  Administered 2021-12-13: 3 [IU] via SUBCUTANEOUS
  Administered 2021-12-13 – 2021-12-16 (×4): 2 [IU] via SUBCUTANEOUS
  Administered 2021-12-16 (×2): 3 [IU] via SUBCUTANEOUS
  Administered 2021-12-17: 2 [IU] via SUBCUTANEOUS

## 2021-12-10 MED ORDER — PANTOPRAZOLE SODIUM 40 MG PO TBEC
40.0000 mg | DELAYED_RELEASE_TABLET | Freq: Every day | ORAL | Status: DC
  Administered 2021-12-11 – 2021-12-17 (×7): 40 mg via ORAL
  Filled 2021-12-10 (×7): qty 1

## 2021-12-10 MED ORDER — METOPROLOL SUCCINATE ER 25 MG PO TB24
25.0000 mg | ORAL_TABLET | Freq: Every day | ORAL | Status: DC
  Administered 2021-12-11: 25 mg via ORAL
  Filled 2021-12-10: qty 1

## 2021-12-10 MED ORDER — ALBUTEROL SULFATE (2.5 MG/3ML) 0.083% IN NEBU: 3.0000 mL | INHALATION_SOLUTION | Freq: Four times a day (QID) | RESPIRATORY_TRACT | Status: DC | PRN

## 2021-12-10 MED ORDER — ACETAMINOPHEN 325 MG PO TABS: 650.0000 mg | ORAL_TABLET | ORAL | Status: DC | PRN

## 2021-12-10 MED ORDER — HYDROMORPHONE HCL 1 MG/ML IJ SOLN
0.5000 mg | INTRAMUSCULAR | Status: DC | PRN
  Administered 2021-12-11 (×2): 0.5 mg via INTRAVENOUS
  Filled 2021-12-10: qty 1
  Filled 2021-12-10: qty 0.5

## 2021-12-10 MED ORDER — FLUTICASONE FUROATE-VILANTEROL 200-25 MCG/ACT IN AEPB
1.0000 | INHALATION_SPRAY | Freq: Every day | RESPIRATORY_TRACT | Status: DC
  Administered 2021-12-11 – 2021-12-17 (×7): 1 via RESPIRATORY_TRACT
  Filled 2021-12-10 (×2): qty 28

## 2021-12-10 MED ORDER — MORPHINE SULFATE (PF) 4 MG/ML IV SOLN
4.0000 mg | Freq: Once | INTRAVENOUS | Status: AC
  Administered 2021-12-10: 4 mg via INTRAVENOUS
  Filled 2021-12-10: qty 1

## 2021-12-10 MED ORDER — ONDANSETRON HCL 4 MG/2ML IJ SOLN
4.0000 mg | Freq: Once | INTRAMUSCULAR | Status: AC
Start: 2021-12-10 — End: 2021-12-10
  Administered 2021-12-10: 4 mg via INTRAVENOUS
  Filled 2021-12-10: qty 2

## 2021-12-10 MED ORDER — SACUBITRIL-VALSARTAN 24-26 MG PO TABS
1.0000 | ORAL_TABLET | Freq: Two times a day (BID) | ORAL | Status: DC
  Administered 2021-12-11: 1 via ORAL
  Filled 2021-12-10: qty 1

## 2021-12-10 MED ORDER — OXYCODONE HCL 5 MG PO TABS: 5.0000 mg | ORAL_TABLET | ORAL | Status: DC | PRN

## 2021-12-10 MED ORDER — PROTHROMBIN COMPLEX CONC HUMAN 500 UNITS IV KIT
4244.0000 [IU] | PACK | Status: AC
  Administered 2021-12-11: 4244 [IU] via INTRAVENOUS
  Filled 2021-12-10: qty 4244

## 2021-12-10 MED ORDER — ONDANSETRON 4 MG PO TBDP: 4.0000 mg | ORAL_TABLET | Freq: Four times a day (QID) | ORAL | Status: DC | PRN

## 2021-12-10 MED ORDER — ONDANSETRON HCL 4 MG/2ML IJ SOLN
4.0000 mg | Freq: Four times a day (QID) | INTRAMUSCULAR | Status: DC | PRN
Start: 2021-12-10 — End: 2021-12-17

## 2021-12-10 MED ORDER — DOCUSATE SODIUM 100 MG PO CAPS
100.0000 mg | ORAL_CAPSULE | Freq: Two times a day (BID) | ORAL | Status: DC
  Administered 2021-12-11 – 2021-12-17 (×13): 100 mg via ORAL
  Filled 2021-12-10 (×14): qty 1

## 2021-12-10 MED ORDER — BISACODYL 10 MG RE SUPP
10.0000 mg | Freq: Every day | RECTAL | Status: DC | PRN
  Administered 2021-12-17: 10 mg via RECTAL
  Filled 2021-12-10: qty 1

## 2021-12-10 MED ORDER — DESVENLAFAXINE SUCCINATE ER 25 MG PO TB24: 25.0000 mg | ORAL_TABLET | Freq: Every day | ORAL | Status: DC

## 2021-12-10 NOTE — ED Triage Notes (Signed)
Pt BIB [facility] for a fall on Eliquis.  Pt tripped and fell on driveway.  Pt complains of neck and rt hip pain. Family endorsed one episode of emesis prior to EMS arrival.  No nausea for EMS. PT has a paced rhythm.  Bruise to rt abdomen and  abrasions to right knee ?

## 2021-12-10 NOTE — Progress Notes (Signed)
?   12/10/21 2115  ?Clinical Encounter Type  ?Visited With Patient not available  ?Visit Type Trauma  ?Referral From Nurse  ?Consult/Referral To Chaplain  ? ?[place] responded. The patient is being attended to by the medical team. There is no support person present. Chaplain remains available for additional support as needed. This note was prepared by Jeanine Luz, M.Div..  For questions please contact by phone [phone].   ?

## 2021-12-10 NOTE — ED Provider Notes (Signed)
?[facility] ?Provider Note ? ? ?CSN: [id number] ?Arrival date & time: 12/10/21  2101 ? ?  ? ?History ? ?CC Fall hip pain ? ? ?[name] is a 88 y.o. male on Eliquis presenting from home with a mechanical fall.  Patient tripped over his walker in the driveway.  He landed on his side or right hip.  He is complaining of pain in his neck and his right hip.  C-spine collar applied by EMS.  No loss of consciousness. ? ?HPI ? ?  ? ?Home Medications ?Prior to Admission medications   ?Medication Sig Start Date End Date Taking? Authorizing Provider  ?apixaban (ELIQUIS) 5 MG TABS tablet Take 1 tablet (5 mg total) by mouth 2 (two) times daily. (HOLDING CURRENTLY DUE TO HEMATURIA) 08/06/2021 08/06/21  Yes Branch, Alphonse Guild, MD  ?metFORMIN (GLUCOPHAGE) 500 MG tablet Take 1 tablet (500 mg total) by mouth daily with breakfast. 10/15/21  Yes Gottschalk, Ashly M, DO  ?omeprazole (PRILOSEC) 20 MG capsule TAKE 1 CAPSULE BY MOUTH  DAILY 10/09/21  Yes Gottschalk, Leatrice Jewels M, DO  ?sacubitril-valsartan (ENTRESTO) 24-26 MG Take 1 tablet by mouth 2 (two) times daily. 09/17/21  Yes BranchAlphonse Guild, MD  ?acetaminophen (TYLENOL) 500 MG tablet Take 500-1,000 mg by mouth every 6 (six) hours as needed for moderate pain.    [provider]  ?albuterol (PROVENTIL HFA;VENTOLIN HFA) 108 (90 Base) MCG/ACT inhaler Inhale 2 puffs into the lungs every 6 (six) hours as needed for wheezing or shortness of breath. 02/12/18   [name], [name], [name]  ?amiodarone (PACERONE) 200 MG tablet TAKE 1 TABLET BY MOUTH  DAILY 10/14/21   Arnoldo Lenis, MD  ?azelastine (ASTELIN) 0.1 % nasal spray Place 1 spray into both nostrils 2 (two) times daily. 10/15/21   [name], DO  ?BESIVANCE 0.6 % SUSP Place 1 drop into both eyes daily as needed (dry eyes). 06/16/19   [provider]  ?budesonide-formoterol (SYMBICORT) 160-4.5 MCG/ACT inhaler Inhale 2 puffs into the lungs 2 (two) times daily. 03/22/19    [name] [name], [name]  ?desvenlafaxine (PRISTIQ) 25 MG 24 hr tablet Take 1 tablet (25 mg total) by mouth daily. 12/09/21   [name], DO  ?furosemide (LASIX) 20 MG tablet TAKE 1 TABLET BY MOUTH  EVERY OTHER DAY 07/16/21   Arnoldo Lenis, MD  ?HYDROcodone-acetaminophen (NORCO/VICODIN) 5-325 MG tablet Take 1 tablet by mouth every 6 (six) hours as needed for moderate pain. 07/12/21 07/12/22  Irine Seal, MD  ?ketoconazole (NIZORAL) 2 % cream Apply topically daily. 06/28/21   Kerney Elbe, DO  ?Lancets (ONETOUCH ULTRASOFT) lancets Use to check blood sugars daily 10/16/17   [name], [name]  ?meclizine (ANTIVERT) 25 MG tablet Take 1 tablet (25 mg total) by mouth 3 (three) times daily as needed for dizziness. 05/13/21   Sherwood Gambler, MD  ?metoprolol succinate (TOPROL-XL) 25 MG 24 hr tablet Take 0.5 tablets (12.5 mg total) by mouth daily. ?Patient taking differently: Take 25 mg by mouth daily. 02/26/21   Arnoldo Lenis, MD  ?ondansetron (ZOFRAN ODT) 4 MG disintegrating tablet Take 1 tablet (4 mg total) by mouth every 8 (eight) hours as needed for nausea or vomiting. 05/13/21   Sherwood Gambler, MD  ?[facility] VERIO test strip TEST BLOOD SUGARS DAILY DX E11.9 07/22/21   Ronnie Doss M, DO  ?rosuvastatin (CRESTOR) 40 MG tablet Take 1 tablet (40 mg total) by mouth daily. To REPLACE Atorvastatin ?Patient taking differently: Take 40 mg  by mouth daily. 03/11/21   [name], DO  ?spironolactone (ALDACTONE) 25 MG tablet TAKE ONE-HALF TABLET BY  MOUTH DAILY 09/16/21   Arnoldo Lenis, MD  ?   ? ?Allergies    ?Penicillins, Tramadol, and Ketorolac tromethamine   ? ?Review of Systems   ?Review of Systems ? ?Physical Exam ?Updated Vital Signs ?BP 96/64   Pulse 66   Temp 97.6 ?F (36.4 ?C) (Oral)   Resp 17   Ht '5\' 11"'$  (1.803 m)   Wt 88.3 kg   SpO2 100%   BMI 27.15 kg/m?  ?Physical Exam ?Constitutional:   ?   General: He is not in acute distress. ?HENT:  ?   Head: Normocephalic and  atraumatic.  ?Eyes:  ?   Conjunctiva/sclera: Conjunctivae normal.  ?   Pupils: Pupils are equal, round, and reactive to light.  ?Neck:  ?   Comments: C spine collar in place ?Cardiovascular:  ?   Rate and Rhythm: Normal rate and regular rhythm.  ?   Pulses: Normal pulses.  ?Pulmonary:  ?   Effort: Pulmonary effort is normal. No respiratory distress.  ?Abdominal:  ?   General: There is no distension.  ?   Tenderness: There is no abdominal tenderness. There is no guarding.  ?Musculoskeletal:  ?   Comments: Pain with right hip flexion  ?Skin: ?   General: Skin is warm and dry.  ?Neurological:  ?   General: No focal deficit present.  ?   Mental Status: He is alert and oriented to person, place, and time. Mental status is at baseline.  ? ? ?ED Results / Procedures / Treatments   ?Labs ?(all labs ordered are listed, but only abnormal results are displayed) ?Labs Reviewed  ?BASIC METABOLIC PANEL - Abnormal; Notable for the following components:  ?    Result Value  ? Glucose, Bld 179 (*)   ? Creatinine, Ser 1.49 (*)   ? GFR, Estimated 45 (*)   ? All other components within normal limits  ?CBC - Abnormal; Notable for the following components:  ? RBC 3.29 (*)   ? Hemoglobin 10.3 (*)   ? HCT 33.6 (*)   ? MCV 102.1 (*)   ? Platelets 79 (*)   ? All other components within normal limits  ?CBC - Abnormal; Notable for the following components:  ? RBC 2.95 (*)   ? Hemoglobin 9.3 (*)   ? HCT 29.7 (*)   ? MCV 100.7 (*)   ? Platelets 72 (*)   ? All other components within normal limits  ?BASIC METABOLIC PANEL - Abnormal; Notable for the following components:  ? Glucose, Bld 138 (*)   ? Creatinine, Ser 1.47 (*)   ? GFR, Estimated 46 (*)   ? Anion gap 4 (*)   ? All other components within normal limits  ?GLUCOSE, CAPILLARY - Abnormal; Notable for the following components:  ? Glucose-Capillary 115 (*)   ? All other components within normal limits  ?CBG MONITORING, ED - Abnormal; Notable for the following components:  ? Glucose-Capillary  142 (*)   ? All other components within normal limits  ?CBG MONITORING, ED - Abnormal; Notable for the following components:  ? Glucose-Capillary 127 (*)   ? All other components within normal limits  ?MAGNESIUM  ?URINALYSIS, ROUTINE W REFLEX MICROSCOPIC  ?CBC  ? ? ?EKG ?EKG Interpretation ? ?Date/Time:  Tuesday December 10 2021 21:14:08 EDT ?Ventricular Rate:  69 ?PR Interval:  232 ?QRS Duration: 223 ?QT Interval:  499 ?QTC Calculation: 535 ?R Axis:   -82 ?Text Interpretation: VENTRICULAR PACED RHYTHM When compared with ECG of 05/13/2021, VENTRICULAR PACED RHYTHM has replaced AV dual-paced rhythm Confirmed by Delora Fuel ([id number]) on 12/10/2021 11:26:25 PM ? ?Radiology ?CT HEAD WO CONTRAST (5MM) ? ?Result Date: 12/10/2021 ?CLINICAL DATA:  Fall injury on blood thinners. EXAM: CT HEAD WITHOUT CONTRAST TECHNIQUE: Contiguous axial images were obtained from the base of the skull through the vertex without intravenous contrast. RADIATION DOSE REDUCTION: This exam was performed according to the departmental dose-optimization program which includes automated exposure control, adjustment of the mA and/or kV according to patient size and/or use of iterative reconstruction technique. COMPARISON:  Head CT 05/13/2021 FINDINGS: Brain: There is moderately developed cerebral atrophy with atrophic ventriculomegaly and mild small vessel disease of the cerebral white matter, mild-to-moderate cerebellar atrophy with old inferior cerebellar lacunar infarcts on both sides, and small chronic lacunar infarcts in the right thalamus and posterior internal capsules. Benign dural calcifications are scattered along the midline falx. No new asymmetry is seen concerning for acute infarct, hemorrhage or mass. There is no midline shift. Vascular: There are calcifications in the distal vertebral arteries and siphons but no hyperdense central vasculature. Skull: The calvarium, skull base and orbits are intact with a chronic trauma deformity of the anterior  aspect of the right orbital floor incidentally again noted. No skull lesion is seen. Sinuses/Orbits: There is mild membrane disease in the paranasal sinuses without fluid levels. There is chronic trace fluid
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
## 2021-12-10 NOTE — TOC CAGE-AID Note (Signed)
Transition of Care (TOC) - CAGE-AID Screening ? ? ?Patient Details  ?Name: [name] ?MRN: [id number] ?Date of Birth: 11-30-1932 ? ?Transition of Care (TOC) CM/SW Contact:    ?Army Melia, RN ?Phone [place] ?12/10/2021, 11:37 PM ? ? ?Clinical Narrative: ? ?Patient presents to the hospital after a fall outside his home, resulting in R pubic rami fractures and C6 fracture. Reports no drug or alcohol use, no resources are indicated at this time.  ? ?CAGE-AID Screening: ?  ? ?Have You Ever Felt You Ought to Cut Down on Your Drinking or Drug Use?: No ?Have People Annoyed You By Critizing Your Drinking Or Drug Use?: No ?Have You Felt Bad Or Guilty About Your Drinking Or Drug Use?: No ?Have You Ever Had a Drink or Used Drugs First Thing In The Morning to Steady Your Nerves or to Get Rid of a Hangover?: No ?CAGE-AID Score: 0 ? ?Substance Abuse Education Offered: No (denies drug and alcohol use, no resources indicated) ? ?  ? ? ? ? ? ? ?
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
## 2021-12-11 ENCOUNTER — Other Ambulatory Visit: Payer: Self-pay

## 2021-12-11 ENCOUNTER — Observation Stay (HOSPITAL_COMMUNITY): Payer: Medicare Other

## 2021-12-11 DIAGNOSIS — S12500A Unspecified displaced fracture of sixth cervical vertebra, initial encounter for closed fracture: Secondary | ICD-10-CM | POA: Diagnosis not present

## 2021-12-11 DIAGNOSIS — Z7901 Long term (current) use of anticoagulants: Secondary | ICD-10-CM | POA: Diagnosis not present

## 2021-12-11 DIAGNOSIS — I480 Paroxysmal atrial fibrillation: Secondary | ICD-10-CM | POA: Diagnosis not present

## 2021-12-11 DIAGNOSIS — M25561 Pain in right knee: Secondary | ICD-10-CM | POA: Diagnosis not present

## 2021-12-11 DIAGNOSIS — I5020 Unspecified systolic (congestive) heart failure: Secondary | ICD-10-CM | POA: Diagnosis not present

## 2021-12-11 DIAGNOSIS — E119 Type 2 diabetes mellitus without complications: Secondary | ICD-10-CM | POA: Diagnosis not present

## 2021-12-11 DIAGNOSIS — M7989 Other specified soft tissue disorders: Secondary | ICD-10-CM | POA: Diagnosis not present

## 2021-12-11 DIAGNOSIS — S32591A Other specified fracture of right pubis, initial encounter for closed fracture: Secondary | ICD-10-CM | POA: Diagnosis not present

## 2021-12-11 DIAGNOSIS — S12590A Other displaced fracture of sixth cervical vertebra, initial encounter for closed fracture: Secondary | ICD-10-CM | POA: Diagnosis not present

## 2021-12-11 DIAGNOSIS — S299XXA Unspecified injury of thorax, initial encounter: Secondary | ICD-10-CM | POA: Diagnosis not present

## 2021-12-11 DIAGNOSIS — S3991XA Unspecified injury of abdomen, initial encounter: Secondary | ICD-10-CM | POA: Diagnosis not present

## 2021-12-11 DIAGNOSIS — S329XXA Fracture of unspecified parts of lumbosacral spine and pelvis, initial encounter for closed fracture: Secondary | ICD-10-CM | POA: Diagnosis not present

## 2021-12-11 LAB — GLUCOSE, CAPILLARY
Glucose-Capillary: 115 mg/dL — ABNORMAL HIGH (ref 70–99)
Glucose-Capillary: 122 mg/dL — ABNORMAL HIGH (ref 70–99)
Glucose-Capillary: 125 mg/dL — ABNORMAL HIGH (ref 70–99)
Glucose-Capillary: 127 mg/dL — ABNORMAL HIGH (ref 70–99)
Glucose-Capillary: 133 mg/dL — ABNORMAL HIGH (ref 70–99)
Glucose-Capillary: 141 mg/dL — ABNORMAL HIGH (ref 70–99)

## 2021-12-11 LAB — CBC
HCT: 29.7 % — ABNORMAL LOW (ref 39.0–52.0)
HCT: 28.7 % — ABNORMAL LOW (ref 39.0–52.0)
Hemoglobin: 9.3 g/dL — ABNORMAL LOW (ref 13.0–17.0)
Hemoglobin: 9.3 g/dL — ABNORMAL LOW (ref 13.0–17.0)
MCH: 31.5 pg (ref 26.0–34.0)
MCH: 32.2 pg (ref 26.0–34.0)
MCHC: 31.3 g/dL (ref 30.0–36.0)
MCHC: 32.4 g/dL (ref 30.0–36.0)
MCV: 100.7 fL — ABNORMAL HIGH (ref 80.0–100.0)
MCV: 99.3 fL (ref 80.0–100.0)
Platelets: 72 10*3/uL — ABNORMAL LOW (ref 150–400)
Platelets: 84 10*3/uL — ABNORMAL LOW (ref 150–400)
RBC: 2.95 MIL/uL — ABNORMAL LOW (ref 4.22–5.81)
RBC: 2.89 MIL/uL — ABNORMAL LOW (ref 4.22–5.81)
RDW: 15.5 % (ref 11.5–15.5)
RDW: 15.4 % (ref 11.5–15.5)
WBC: 8.1 10*3/uL (ref 4.0–10.5)
WBC: 9 10*3/uL (ref 4.0–10.5)
nRBC: 0 % (ref 0.0–0.2)
nRBC: 0 % (ref 0.0–0.2)

## 2021-12-11 LAB — URINALYSIS, ROUTINE W REFLEX MICROSCOPIC
Bilirubin Urine: NEGATIVE
Glucose, UA: NEGATIVE mg/dL
Ketones, ur: NEGATIVE mg/dL
Leukocytes,Ua: NEGATIVE
Nitrite: NEGATIVE
Protein, ur: 30 mg/dL — AB
Specific Gravity, Urine: 1.024 (ref 1.005–1.030)
pH: 5 (ref 5.0–8.0)

## 2021-12-11 LAB — MAGNESIUM: Magnesium: 1.9 mg/dL (ref 1.7–2.4)

## 2021-12-11 LAB — BASIC METABOLIC PANEL
Anion gap: 4 — ABNORMAL LOW (ref 5–15)
BUN: 20 mg/dL (ref 8–23)
CO2: 26 mmol/L (ref 22–32)
Calcium: 9.2 mg/dL (ref 8.9–10.3)
Chloride: 107 mmol/L (ref 98–111)
Creatinine, Ser: 1.47 mg/dL — ABNORMAL HIGH (ref 0.61–1.24)
GFR, Estimated: 46 mL/min — ABNORMAL LOW (ref >60)
Glucose, Bld: 138 mg/dL — ABNORMAL HIGH (ref 70–99)
Potassium: 4.5 mmol/L (ref 3.5–5.1)
Sodium: 137 mmol/L (ref 135–145)

## 2021-12-11 LAB — CBG MONITORING, ED: Glucose-Capillary: 127 mg/dL — ABNORMAL HIGH (ref 70–99)

## 2021-12-11 MED ORDER — HYDROMORPHONE HCL 1 MG/ML IJ SOLN
0.2500 mg | Freq: Four times a day (QID) | INTRAMUSCULAR | Status: DC | PRN
  Administered 2021-12-12 – 2021-12-17 (×5): 0.25 mg via INTRAVENOUS
  Filled 2021-12-11 (×5): qty 0.5

## 2021-12-11 MED ORDER — ACETAMINOPHEN 500 MG PO TABS
1000.0000 mg | ORAL_TABLET | Freq: Four times a day (QID) | ORAL | Status: DC
  Administered 2021-12-11 – 2021-12-17 (×21): 1000 mg via ORAL
  Filled 2021-12-11 (×24): qty 2

## 2021-12-11 MED ORDER — SACUBITRIL-VALSARTAN 24-26 MG PO TABS
1.0000 | ORAL_TABLET | Freq: Two times a day (BID) | ORAL | Status: DC
  Filled 2021-12-11: qty 1

## 2021-12-11 MED ORDER — METHOCARBAMOL 500 MG PO TABS
500.0000 mg | ORAL_TABLET | Freq: Four times a day (QID) | ORAL | Status: DC
  Administered 2021-12-11 – 2021-12-14 (×15): 500 mg via ORAL
  Filled 2021-12-11 (×16): qty 1

## 2021-12-11 MED ORDER — OXYCODONE HCL 5 MG PO TABS
2.5000 mg | ORAL_TABLET | ORAL | Status: DC | PRN
  Administered 2021-12-11 – 2021-12-17 (×15): 5 mg via ORAL
  Filled 2021-12-11 (×17): qty 1

## 2021-12-11 MED ORDER — CHLORHEXIDINE GLUCONATE CLOTH 2 % EX PADS
6.0000 | MEDICATED_PAD | Freq: Every day | CUTANEOUS | Status: DC
  Administered 2021-12-12 – 2021-12-17 (×5): 6 via TOPICAL

## 2021-12-11 MED ORDER — IOHEXOL 300 MG/ML  SOLN
80.0000 mL | Freq: Once | INTRAMUSCULAR | Status: AC | PRN
  Administered 2021-12-11: 80 mL via INTRAVENOUS

## 2021-12-11 NOTE — Progress Notes (Signed)
OT Cancellation Note ? ?Patient Details ?Name: [name] ?MRN: [id number] ?DOB: [birth year] ? ? ?Cancelled Treatment:    Reason Eval/Treat Not Completed: Medical issues which prohibited therapy Awaiting ortho recommendations prior to mobilizing with OT due to pubic rami fx and trauma. Will follow-up as schedule permits.  ? ?Layla Maw ?12/11/2021, 9:41 AM ?
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
## 2021-12-11 NOTE — Progress Notes (Signed)
PT Cancellation Note ? ?Patient Details ?Name: [name] ?MRN: [id number] ?DOB: 12/25/1932 ? ? ?Cancelled Treatment:    Reason Eval/Treat Not Completed: Patient not medically ready. Waiting on neuro and ortho recommendations before mobilizing pt for PT due to cervical fracture and trauma. PT will follow up as schedule allows. ? ?Jonne Ply, SPT ? ?Jonne Ply ?12/11/2021, 9:16 AM ?

## 2021-12-11 NOTE — Progress Notes (Signed)
Patient refused CPAP for his hospital stay. He states he does not use CPAP at home. Currently stable on 2L [place] ?

## 2021-12-11 NOTE — Evaluation (Signed)
Physical Therapy Evaluation ?Patient Details ?Name: [name] ?MRN: [id number] ?DOB: [birth year] ?Today's Date: 12/11/2021 ? ?History of Present Illness ? Pt is an 88 year old male presented as a level 2 trauma after sustaining a ground-level fall.  Pt found to have C6 fx and pubic rami fx with plans for nonoperative mgmt. PMH: CAD, COPD, CVA, GERD, HTN, hx of CABG, PSA, a fib, hx of prostate cancer, DM, vertigo.  ?Clinical Impression ? Patient presented to [facility] 12/11/21 after sustaining a fall at home consistent with patient's diagnosis of R pelvic fracture and C6 fracture. Pt's impairments include decreased cognition, cardiopulmonary conditioning, activity tolerance, strength, and balance. These impairments are limiting his ability to safely and independently transfer, ambulate, and navigate stairs. Patient requires total Ax2 for bed mobility and min Ax2 for sit to stand. Patient had very soft BP in standing with low mean arterial pressure. Orthostatics below. RN notified. Pt on 1-2L O2 during mobility tasks. SPT recommending SNF upon D/C due to mobility deficits. PT will continue to follow acutely to maximize pt's safety and independence with functional mobility. ?   ?Orthostatic BPs ? ?Supine 114/85mHg (101)  ?Sitting 102/593mg (62)  ?Standing 79/5053m (56)  ? ?  ? ?Recommendations for follow up therapy are one component of a multi-disciplinary discharge planning process, led by the attending physician.  Recommendations may be updated based on patient status, additional functional criteria and insurance authorization. ? ?Follow Up Recommendations Skilled nursing-short term rehab (<3 hours/day) ? ?  ?Assistance Recommended at Discharge Frequent or constant Supervision/Assistance  ?Patient can return home with the following ? Two people to help with walking and/or transfers;Two people to help with bathing/dressing/bathroom;Assistance with cooking/housework;Assist for transportation;Help with stairs or ramp for  entrance;Direct supervision/assist for medications management;Direct supervision/assist for financial management ? ?  ?Equipment Recommendations None recommended by PT  ?Recommendations for Other Services ?    ?  ?Functional Status Assessment Patient has had a recent decline in their functional status and demonstrates the ability to make significant improvements in function in a reasonable and predictable amount of time.  ? ?  ?Precautions / Restrictions Precautions ?Precautions: Fall;Other (comment);Cervical ?Precaution Booklet Issued: No (verbally reviewed keeping spine straight during mobility tasks) ?Precaution Comments: monitor O2 (does not wear at baseline), soft BP ?Required Braces or Orthoses: Cervical Brace ?Cervical Brace: Hard collar ?Restrictions ?Weight Bearing Restrictions: Yes ?RLE Weight Bearing: Weight bearing as tolerated  ? ?  ? ?Mobility ? Bed Mobility ?Overal bed mobility: Needs Assistance ?Bed Mobility: Supine to Sit, Sit to Supine ?  ?  ?Supine to sit: Total assist, +2 for physical assistance, +2 for safety/equipment ?Sit to supine: Total assist, +2 for physical assistance, +2 for safety/equipment ?  ?General bed mobility comments: pt requiring total Ax2 during helicopter supine<>sit transition to keep spine straight and aligned during positional change. Required total Ax2 for return to supine ?  ? ?Transfers ?Overall transfer level: Needs assistance ?Equipment used: Rolling walker (2 wheels) ?Transfers: Sit to/from Stand ?Sit to Stand: Min assist, +2 physical assistance, +2 safety/equipment ?  ?  ?  ?  ?  ?General transfer comment: Min A to power up with RW and cues for hand placement. Pt remained standing for ~1 minute while taking BP. Pt fatigued quickly and needed to sit down. LLE knee fleed in stance and was no longer to activate glutes to obtain neutral posture. Further mobility deferred due to soft BP, low mean arterial pressure, and pt fatigue. ?  ? ?Ambulation/Gait ?  ?  ?  ?  ?  ?  ?   ?  ? ?  Stairs ?  ?  ?  ?  ?  ? ?Wheelchair Mobility ?  ? ?Modified Rankin (Stroke Patients Only) ?  ? ?  ? ?Balance Overall balance assessment: Needs assistance, History of Falls ?Sitting-balance support: Feet supported, Single extremity supported ?Sitting balance-Leahy Scale: Fair ?Sitting balance - Comments: required single UE support on mattress or holding onto SPT for support in sitting while BP taken. Pt was min guard A for safety and steadying ?  ?Standing balance support: Reliant on assistive device for balance, During functional activity, Bilateral upper extremity supported ?Standing balance-Leahy Scale: Poor ?Standing balance comment: reliant on RW for BUE support and offloading painful RLE ?  ?  ?  ?  ?  ?  ?  ?  ?  ?  ?  ?   ? ? ? ?Pertinent Vitals/Pain Pain Assessment ?Pain Assessment: Faces ?Faces Pain Scale: Hurts even more ?Pain Location: R hip ?Pain Descriptors / Indicators: Grimacing, Guarding, Sore ?Pain Intervention(s): Monitored during session  ? ? ?Home Living Family/patient expects to be discharged to:: Private residence ?Living Arrangements: Spouse/significant other ?Available Help at Discharge: Family ?Type of Home: House ?Home Access: Stairs to enter ?  ?Entrance Stairs-Number of Steps: 1 through garage ?Alternate Level Stairs-Number of Steps: 15 stairs ?Home Layout: Two level;Laundry or work area in basement ?Home Equipment: Conservation officer, nature (2 wheels);Shower seat;BSC/3in1;Wheelchair - manual;Cane - single point;Cane - quad;Rollator (4 wheels) ?Additional Comments: Has about 12 walking sticks.  ?  ?Prior Function Prior Level of Function : Needs assist ?  ?  ?  ?Physical Assist : ADLs (physical) ?  ?ADLs (physical): IADLs ?Mobility Comments: use of RW vs cane; recent falls but per chart, reports able to get back up without assist ?ADLs Comments: Independent with ADLs (sponge bathes); wife does IADLs primarily ?  ? ? ?Hand Dominance  ? Dominant Hand: Right ? ?  ?Extremity/Trunk Assessment  ?  Upper Extremity Assessment ?Upper Extremity Assessment: Defer to OT evaluation ?  ? ?Lower Extremity Assessment ?Lower Extremity Assessment: RLE deficits/detail;LLE deficits/detail ?RLE Deficits / Details: consistent with non displaced pelvic fracture; places less weight through LE due to pain ?LLE Deficits / Details: LLE knee flexed in stance phase and pt able to activate full hip extension for brief moments before fatigue. ?  ? ?Cervical / Trunk Assessment ?Cervical / Trunk Assessment: Kyphotic  ?Communication  ? Communication: HOH;Other (comment) (very heavy accent and mumbles words)  ?Cognition Arousal/Alertness: Awake/alert ?Behavior During Therapy: [facility] for tasks assessed/performed ?Overall Cognitive Status: No family/caregiver present to determine baseline cognitive functioning ?  ?  ?  ?  ?  ?  ?  ?  ?  ?  ?  ?  ?  ?  ?  ?  ?General Comments: pleasant, however, slow processing and problem solving deficits notable with mobility. ?  ?  ? ?  ?General Comments General comments (skin integrity, edema, etc.): SpO2 resting at 93% on 2L O2 via [place] upon entry. Pt transitioned to 1L O2 with portable O2 with no change in sats. Upon standing, pt desats to 87%. Increased O2 to 2L O2 once again. BP in supine 114/92 (101), sitting 102/52 (62), and standing 79/50 (56); pt appeared flushed and weak, however, never complained of dizziness or lightheadedness. ? ?  ?Exercises    ? ?Assessment/Plan  ?  ?PT Assessment Patient needs continued PT services  ?PT Problem List Decreased strength;Decreased activity tolerance;Decreased balance;Decreased mobility;Decreased cognition;Pain;Cardiopulmonary status limiting activity ? ?   ?  ?PT Treatment Interventions  DME instruction;Gait training;Stair training;Functional mobility training;Therapeutic activities;Therapeutic exercise;Balance training;Cognitive remediation;Patient/family education   ? ?PT Goals (Current goals can be found in the Care Plan section)  ?Acute Rehab PT Goals ?Patient  Stated Goal: to get better so he can go home ?PT Goal Formulation: With patient ?Time For Goal Achievement: 12/25/21 ?Potential to Achieve Goals: Good ? ?  ?Frequency Min 3X/week ?  ? ? ?Co-evaluation
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
## 2021-12-11 NOTE — Progress Notes (Signed)
? ? ?   ?Subjective: ?CC: ?Complains of pain in his neck and right pelvis. No n/t/w. Moving all extremities. Some right chest wall pain that is greater with palpation and sob. On 3L. Does not wear o2 at home. Does have COPD and OSA. Does not wear CPAP at night. Some lower abdominal pain. No n/v. Ext cath present. He thinks he peed but is not sure. Nothing in ext foley bag. RN reports no output since he came upstairs. Nothing documented on I/O. Has 4 daughters, 1 is a Therapist, sports in [place]. Lives at home with his wife in a 1 story home w/ 1 step to enter the home. Walks at baseline with a walker.  ? ? ?Objective: ?Vital signs in last 24 hours: ?Temp:  [97.5 ?F (36.4 ?C)-98 ?F (36.7 ?C)] 97.6 ?F (36.4 ?C) (04/19 0739) ?Pulse Rate:  [58-68] 60 (04/19 0739) ?Resp:  [15-20] 17 (04/19 0739) ?BP: (90-129)/(51-93) 94/56 (04/19 0739) ?SpO2:  [92 %-99 %] 92 % (04/19 0739) ?Weight:  [88.3 kg] 88.3 kg (04/18 2300) ?  ? ?Intake/Output from previous day: ?04/18 0701 - 04/19 0700 ?In: 155.4 [IV Piggyback:155.4] ?Out: -  ?Intake/Output this shift: ?No intake/output data recorded. ? ?PE: ?Gen:  Alert, NAD, pleasant ?Neck: C-Collar in place ?Card:  Reg rate. Paced monitor on monitor ?Pulm:  CTAB, no W/R/R, effort normal. R chest wall ttp. On 3L ?Abd: Protuberant but soft. Some mild lower abdominal tenderness. No peritonitis. +BS. Prior midline incision he says is from a hernia repair. Incisional/ventral hernia noted that appears reducible.  ?Psych: A&Ox2 (self and situation but thinks he is at home in bed and it's 2003) ?Msk: Moves b/l UE's over major joints without difficulty. No TTP over major joints of BUE's. No ttp of the LLE. Right hip exam deferred with known pubic rami fx's. Right knee contusion noted w/ ttp over - hx of replacement per patient w/ noted scar overlying that is well healed. No ttp of the right ankle ?Neuro: CN 3-12 grossly intact. Non-focal. MAE's. SILT.  ? ?Lab Results:  ?Recent Labs  ?  12/10/21 ?2158  12/11/21 ?0447  ?WBC 9.1 8.1  ?HGB 10.3* 9.3*  ?HCT 33.6* 29.7*  ?PLT 79* 72*  ? ?BMET ?Recent Labs  ?  12/10/21 ?2158 12/11/21 ?0447  ?NA 140 137  ?K 4.4 4.5  ?CL 108 107  ?CO2 23 26  ?GLUCOSE 179* 138*  ?BUN 20 20  ?CREATININE 1.49* 1.47*  ?CALCIUM 9.7 9.2  ? ?PT/INR ?No results for input(s): LABPROT, INR in the last 72 hours. ?CMP  ?   ?Component Value Date/Time  ? NA 137 12/11/2021 0447  ? NA 142 10/15/2021 0927  ? K 4.5 12/11/2021 0447  ? CL 107 12/11/2021 0447  ? CO2 26 12/11/2021 0447  ? GLUCOSE 138 (H) 12/11/2021 0447  ? BUN 20 12/11/2021 0447  ? BUN 24 10/15/2021 0927  ? CREATININE 1.47 (H) 12/11/2021 0447  ? CREATININE 0.92 03/10/2013 0810  ? CALCIUM 9.2 12/11/2021 0447  ? PROT 6.1 07/01/2021 1523  ? ALBUMIN 3.8 10/15/2021 0927  ? AST 24 07/01/2021 1523  ? ALT 21 07/01/2021 1523  ? ALKPHOS 93 07/01/2021 1523  ? BILITOT 0.9 07/01/2021 1523  ? GFRNONAA 46 (L) 12/11/2021 0447  ? GFRNONAA 79 03/10/2013 0810  ? GFRAA 58 (L) 10/05/2020 0950  ? GFRAA >89 03/10/2013 0810  ? ?Lipase  ?   ?Component Value Date/Time  ? LIPASE 69 (H) 05/13/2021 1102  ? ? ?Studies/Results: ?CT HEAD WO CONTRAST (  5MM) ? ?Result Date: 12/10/2021 ?CLINICAL DATA:  Fall injury on blood thinners. EXAM: CT HEAD WITHOUT CONTRAST TECHNIQUE: Contiguous axial images were obtained from the base of the skull through the vertex without intravenous contrast. RADIATION DOSE REDUCTION: This exam was performed according to the departmental dose-optimization program which includes automated exposure control, adjustment of the mA and/or kV according to patient size and/or use of iterative reconstruction technique. COMPARISON:  Head CT 05/13/2021 FINDINGS: Brain: There is moderately developed cerebral atrophy with atrophic ventriculomegaly and mild small vessel disease of the cerebral white matter, mild-to-moderate cerebellar atrophy with old inferior cerebellar lacunar infarcts on both sides, and small chronic lacunar infarcts in the right thalamus and  posterior internal capsules. Benign dural calcifications are scattered along the midline falx. No new asymmetry is seen concerning for acute infarct, hemorrhage or mass. There is no midline shift. Vascular: There are calcifications in the distal vertebral arteries and siphons but no hyperdense central vasculature. Skull: The calvarium, skull base and orbits are intact with a chronic trauma deformity of the anterior aspect of the right orbital floor incidentally again noted. No skull lesion is seen. Sinuses/Orbits: There is mild membrane disease in the paranasal sinuses without fluid levels. There is chronic trace fluid in the bilateral mastoid tips. There are old lens extractions. Other: None. IMPRESSION: 1. No acute intracranial CT findings or depressed skull fractures. 2. Stable chronic change. Electronically Signed   By: Telford Nab M.D.   On: 12/10/2021 22:13  ? ?CT Cervical Spine Wo Contrast ? ?Result Date: 12/10/2021 ?CLINICAL DATA:  Fall on blood thinners. EXAM: CT CERVICAL SPINE WITHOUT CONTRAST TECHNIQUE: Multidetector CT imaging of the cervical spine was performed without intravenous contrast. Multiplanar CT image reconstructions were also generated. RADIATION DOSE REDUCTION: This exam was performed according to the departmental dose-optimization program which includes automated exposure control, adjustment of the mA and/or kV according to patient size and/or use of iterative reconstruction technique. COMPARISON:  None. FINDINGS: Alignment: There is complete anterior atlantodental joint space loss, with periarticular osteophytes. There is no C1-2 offset. There is a 2 mm anterolisthesis C3-4 and C4-5, 2-3 mm anterolisthesis C5-6, all likely related facet hypertrophy with no traumatic listhesis suspected. Skull base and vertebrae: There is mild osteopenia. There is acute anterior inferior oblique fracture through the C6 vertebral body extending through a bridging anterior osteophyte complex at this level,  nondisplaced and without fracture involvement of the pedicles and posterior elements. This is only well seen on the sagittal reformatted and coronal reformat imaging. No other fractures are visualized. There is no spinal compression injury. Intact bridging osteophytes are present at C5-6 with nonbridging smaller bidirectional osteophytes C2-3 through C4-5 and intact anterior bridging osteophytes C7-T1. The skull base is intact.  There is trace fluid in the mastoid tips. Soft tissues and spinal canal: No prevertebral fluid or swelling. No visible canal hematoma. There are moderate to heavy calcifications in both carotid bifurcations. There are incidental stones in the right palatine tonsil pillar. There are nuchal ligament calcific bodies at C5 and 6 likely from remote trauma. Disc levels: The discs are variably degenerated, most prominent degenerative disc change with disc calcifications is seen at C5-6 and C6-7. Most levels do show posterior disc osteophyte complexes but they do not compress the cord. There is advanced facet joint hypertrophy with uncinate spurring and foraminal stenosis which is mild on the left at C2-3, bilaterally severe at C3-4 and C4-5, bilaterally moderate at C5-6. Upper chest: Negative. Other: None. IMPRESSION: 1. Acute oblique  fracture through the peridiscal anterior inferior C6 vertebral body extended through a bridging osteophyte complex. The fracture is nondisplaced. These fractures may be associated with instability but the pedicles and posterior elements do not show evidence of fractures. This finding was phoned to Dr. Langston Masker at 10:31 p.m., 12/10/2021 with acknowledgement of findings. 2. Osteopenia and degenerative changes described above. 3. Carotid atherosclerosis. 4. Trace fluid in the mastoid tips. Electronically Signed   By: Telford Nab M.D.   On: 12/10/2021 22:30  ? ?CT PELVIS WO CONTRAST ? ?Result Date: 12/10/2021 ?CLINICAL DATA:  Fall on blood thinners.  Hip fracture  suspected. EXAM: CT PELVIS WITHOUT CONTRAST TECHNIQUE: Multidetector CT imaging of the pelvis was performed following the standard protocol without intravenous contrast. RADIATION DOSE REDUCTION: This exam was performed acc

## 2021-12-11 NOTE — Evaluation (Addendum)
Occupational Therapy Evaluation ?Patient Details ?Name: [name] ?MRN: [id number] ?DOB: Nov 05, 1932 ?Today's Date: 12/11/2021 ? ? ?History of Present Illness Pt is an 88 year old male presented as a level 2 trauma after sustaining a ground-level fall.  Pt found to have C6 fx and pubic rami fx with plans for nonoperative mgmt. PMH: CAD, COPD, CVA, GERD, HTN, hx of CABG, PSA, a fib, hx of prostate cancer, DM, vertigo.  ? ?Clinical Impression ?  ?PTA, pt lives with spouse, typically Modified Independent with ADLs/mobility using RW vs walking stick. Pt reports wife completes IADLs. Pt presents now with deficits in pain, application of cervical precautions, standing balance and endurance. Pt premedicated prior to session and required Max A for bed mobility. Pt able to demo standing with RW at [place] but difficulty taking steps, so Stedy utilized for transfer OOB. Pt requires Min A for UB ADLs and Max A for LB Adls due to deficits. Plan to further educate on cervical precautions for ADLs, progress bathroom mobility in next sessions. Based on current presentation, recommend SNF rehab. However, if pain more controlled and pt able to progress mobility well during admission, could consider returning home with increased family assist.   ? ?BP supine: 96/47 ?BP sitting: 103/76 ?BP standing: 77/64 ?BP after transfer & up in chair > 5 min: 95/66 ?   ? ?Recommendations for follow up therapy are one component of a multi-disciplinary discharge planning process, led by the attending physician.  Recommendations may be updated based on patient status, additional functional criteria and insurance authorization.  ? ?Follow Up Recommendations ? Skilled nursing-short term rehab (<3 hours/day)  ?  ?Assistance Recommended at Discharge Frequent or constant Supervision/Assistance  ?Patient can return home with the following A lot of help with bathing/dressing/bathroom;A lot of help with walking and/or transfers ? ?  ?Functional Status  Assessment ? Patient has had a recent decline in their functional status and demonstrates the ability to make significant improvements in function in a reasonable and predictable amount of time.  ?Equipment Recommendations ? None recommended by OT (appears well equipped; TBD)  ?  ?Recommendations for Other Services   ? ? ?  ?Precautions / Restrictions Precautions ?Precautions: Fall;Other (comment);Cervical ?Precaution Booklet Issued: No (verbally reviewed) ?Precaution Comments: monitor O2 (does not wear at baseline), BP ?Required Braces or Orthoses: Cervical Brace ?Cervical Brace: Hard collar ?Restrictions ?Weight Bearing Restrictions: Yes ?RLE Weight Bearing: Weight bearing as tolerated  ? ?  ? ?Mobility Bed Mobility ?Overal bed mobility: Needs Assistance ?Bed Mobility: Supine to Sit ?  ?  ?Supine to sit: Max assist, HOB elevated ?  ?  ?General bed mobility comments: assisting in moving B LE to EOB with more assist needed for R LE. cued pt to sit straight up to avoid twisting of spine with moderate assist to lift trunk. pt able to assist scooting forward EOB ?  ? ?Transfers ?Overall transfer level: Needs assistance ?Equipment used: Rolling walker (2 wheels), Ambulation equipment used ?Transfers: Sit to/from Stand, Bed to chair/wheelchair/BSC ?Sit to Stand: Min assist ?  ?  ?  ?  ?  ?General transfer comment: Min A to stand with RW, attempted side steps with increased pain and difficulty offloading to affected LE. Opted for [facility] use for recliner transfer with Min A to stand in device ?Transfer via Lift Equipment: Stedy ? ?  ?Balance Overall balance assessment: Needs assistance, History of Falls ?Sitting-balance support: No upper extremity supported, Feet supported ?Sitting balance-Leahy Scale: Good ?  ?  ?Standing  balance support: Bilateral upper extremity supported, During functional activity ?Standing balance-Leahy Scale: Poor ?Standing balance comment: reliant on UE support for balance and offloading painful  LE ?  ?  ?  ?  ?  ?  ?  ?  ?  ?  ?  ?   ? ?ADL either performed or assessed with clinical judgement  ? ?ADL Overall ADL's : Needs assistance/impaired ?Eating/Feeding: Independent;Sitting ?  ?Grooming: Set up;Sitting ?  ?Upper Body Bathing: Minimal assistance;Sitting ?  ?Lower Body Bathing: Maximal assistance;Sit to/from stand ?  ?Upper Body Dressing : Minimal assistance;Sitting ?  ?Lower Body Dressing: Maximal assistance;Sit to/from stand ?  ?  ?Toilet Transfer Details (indicate cue type and reason): difficulty taking steps with RW, use of Stedy at [place] A ?Toileting- Clothing Manipulation and Hygiene: Maximal assistance;Sit to/from stand ?  ?  ?  ?  ?General ADL Comments: Limited by fx pain, cervical precautions and balance deficits. Pt able to stand fairly well though a lot of difficulty taking steps impacting ability to complete ADLs/mobility. Began education on cervical precautions with collar use assisting in maintaining precautions well  ? ? ? ?Vision Baseline Vision/History: 1 Wears glasses ?Ability to See in Adequate Light: 1 Impaired ?Patient Visual Report: No change from baseline ?Vision Assessment?: No apparent visual deficits  ?   ?Perception   ?  ?Praxis   ?  ? ?Pertinent Vitals/Pain Pain Assessment ?Pain Assessment: Faces ?Faces Pain Scale: Hurts even more ?Pain Location: R hip ?Pain Descriptors / Indicators: Grimacing, Guarding, Sore ?Pain Intervention(s): Limited activity within patient's tolerance, Monitored during session, Premedicated before session  ? ? ? ?Hand Dominance Right ?  ?Extremity/Trunk Assessment Upper Extremity Assessment ?Upper Extremity Assessment: Overall WFL for tasks assessed;LUE deficits/detail ?LUE Deficits / Details: crepitation felt when reaching out to bedrail ?  ?Lower Extremity Assessment ?Lower Extremity Assessment: Defer to PT evaluation ?  ?Cervical / Trunk Assessment ?Cervical / Trunk Assessment: Kyphotic ?  ?Communication Communication ?Communication: HOH ?  ?Cognition  Arousal/Alertness: Awake/alert ?Behavior During Therapy: [facility] for tasks assessed/performed ?Overall Cognitive Status: No family/caregiver present to determine baseline cognitive functioning ?  ?  ?  ?  ?  ?  ?  ?  ?  ?  ?  ?  ?  ?  ?  ?  ?General Comments: pleasant, mildly "loopy" from pain medication, follows directions well. minor STM deficits though may also be from pain medications ?  ?  ?General Comments  SpO2 88% on RA after activity, no DOE - applied O2 extender and placed on 2L O2  - RN aware. ? ?  ?Exercises   ?  ?Shoulder Instructions    ? ? ?Home Living Family/patient expects to be discharged to:: Private residence ?Living Arrangements: Spouse/significant other ?Available Help at Discharge: Family ?Type of Home: House ?Home Access: Stairs to enter ?Entrance Stairs-Number of Steps: 1 through garage ?  ?Home Layout: Two level;Laundry or work area in basement ?  ?  ?Bathroom Shower/Tub: Walk-in shower ?  ?Bathroom Toilet: Standard ?  ?  ?Home Equipment: Conservation officer, nature (2 wheels);Shower seat;BSC/3in1;Wheelchair - manual;Cane - single point;Cane - quad;Rollator (4 wheels) (multiple walking sticks) ?  ?  ?  ? ?  ?Prior Functioning/Environment Prior Level of Function : Needs assist ?  ?  ?  ?Physical Assist : ADLs (physical) ?  ?ADLs (physical): IADLs ?Mobility Comments: use of RW vs cane; recent falls but per chart, reports able to get back up without assist ?ADLs Comments: Independent with ADLs (sponge  bathes); wife does IADLs primarily ?  ? ?  ?  ?OT Problem List: Decreased strength;Decreased activity tolerance;Impaired balance (sitting and/or standing);Decreased safety awareness;Decreased knowledge of precautions;Decreased knowledge of use of DME or AE;Cardiopulmonary status limiting activity;Pain ?  ?   ?OT Treatment/Interventions: Self-care/ADL training;Therapeutic exercise;Energy conservation;DME and/or AE instruction;Therapeutic activities;Patient/family education;Balance training  ?  ?OT Goals(Current  goals can be found in the care plan section) Acute Rehab OT Goals ?Patient Stated Goal: get collar off, pain control and see [name] ?OT Goal Formulation: With patient ?Time For Goal Achievement: 12/24/21 ?Potential t

## 2021-12-12 ENCOUNTER — Encounter (HOSPITAL_COMMUNITY): Payer: Self-pay

## 2021-12-12 DIAGNOSIS — S32591A Other specified fracture of right pubis, initial encounter for closed fracture: Secondary | ICD-10-CM | POA: Diagnosis not present

## 2021-12-12 DIAGNOSIS — S12590A Other displaced fracture of sixth cervical vertebra, initial encounter for closed fracture: Secondary | ICD-10-CM | POA: Diagnosis not present

## 2021-12-12 LAB — BASIC METABOLIC PANEL
Anion gap: 4 — ABNORMAL LOW (ref 5–15)
BUN: 26 mg/dL — ABNORMAL HIGH (ref 8–23)
CO2: 24 mmol/L (ref 22–32)
Calcium: 9 mg/dL (ref 8.9–10.3)
Chloride: 111 mmol/L (ref 98–111)
Creatinine, Ser: 1.46 mg/dL — ABNORMAL HIGH (ref 0.61–1.24)
GFR, Estimated: 47 mL/min — ABNORMAL LOW (ref >60)
Glucose, Bld: 174 mg/dL — ABNORMAL HIGH (ref 70–99)
Potassium: 4.5 mmol/L (ref 3.5–5.1)
Sodium: 139 mmol/L (ref 135–145)

## 2021-12-12 LAB — CBC
HCT: 25.6 % — ABNORMAL LOW (ref 39.0–52.0)
Hemoglobin: 8 g/dL — ABNORMAL LOW (ref 13.0–17.0)
MCH: 31.6 pg (ref 26.0–34.0)
MCHC: 31.3 g/dL (ref 30.0–36.0)
MCV: 101.2 fL — ABNORMAL HIGH (ref 80.0–100.0)
Platelets: 67 10*3/uL — ABNORMAL LOW (ref 150–400)
RBC: 2.53 MIL/uL — ABNORMAL LOW (ref 4.22–5.81)
RDW: 15.7 % — ABNORMAL HIGH (ref 11.5–15.5)
WBC: 7.3 10*3/uL (ref 4.0–10.5)
nRBC: 0 % (ref 0.0–0.2)

## 2021-12-12 LAB — GLUCOSE, CAPILLARY
Glucose-Capillary: 103 mg/dL — ABNORMAL HIGH (ref 70–99)
Glucose-Capillary: 139 mg/dL — ABNORMAL HIGH (ref 70–99)
Glucose-Capillary: 170 mg/dL — ABNORMAL HIGH (ref 70–99)
Glucose-Capillary: 161 mg/dL — ABNORMAL HIGH (ref 70–99)
Glucose-Capillary: 139 mg/dL — ABNORMAL HIGH (ref 70–99)

## 2021-12-12 NOTE — Progress Notes (Signed)
Patient ID: [name], male   DOB: [birth year], 88 y.o.   MRN: [id number] ?   ?  ?Subjective: ?Does not like the C collar ?ROS negative except as listed above. ?Objective: ?Vital signs in last 24 hours: ?Temp:  [97.7 ?F (36.5 ?C)-98 ?F (36.7 ?C)] 98 ?F (36.7 ?C) (04/20 6389) ?Pulse Rate:  [60-66] 60 (04/20 0705) ?Resp:  [12-19] 17 (04/20 0705) ?BP: (86-108)/(7-67) 102/7 (04/20 0705) ?SpO2:  [95 %-100 %] 96 % (04/20 0705) ?Weight:  [88.3 kg] 88.3 kg (04/19 1015) ?Last BM Date : 12/10/21 ? ?Intake/Output from previous day: ?04/19 0701 - 04/20 0700 ?In: 1441.8 [P.O.:720; I.V.:721.8] ?Out: 550 [Urine:550] ?Intake/Output this shift: ?Total I/O ?In: 200 [P.O.:200] ?Out: -  ? ?General appearance: cooperative ?Resp: clear to auscultation bilaterally ?Cardio: RRR paced ?GI: soft, NT ?Extremities: mild edema ? ?Lab Results: ?CBC  ?Recent Labs  ?  12/11/21 ?0447 12/11/21 ?1248  ?WBC 8.1 9.0  ?HGB 9.3* 9.3*  ?HCT 29.7* 28.7*  ?PLT 72* 84*  ? ?BMET ?Recent Labs  ?  12/10/21 ?2158 12/11/21 ?0447  ?NA 140 137  ?K 4.4 4.5  ?CL 108 107  ?CO2 23 26  ?GLUCOSE 179* 138*  ?BUN 20 20  ?CREATININE 1.49* 1.47*  ?CALCIUM 9.7 9.2  ? ?PT/INR ?No results for input(s): LABPROT, INR in the last 72 hours. ?ABG ?No results for input(s): PHART, HCO3 in the last 72 hours. ? ?Invalid input(s): PCO2, PO2 ? ?Studies/Results: ? ?Anti-infectives: ?Anti-infectives (From admission, onward)  ? ? None  ? ?  ? ? ?Assessment/Plan: ?GLF on Eliquis  ?C6 vertebral body fx - Per NSGY, Dr. Kathyrn Sheriff. Cont collar till final recs ?R sup/inf pubic rami fx - Per Ortho, consult pending.  ?Pelvic Hematoma - CT pelvis w/ Pelvic hemoperitoneum with 8.3 x 5.6 x 3.2 cm anterior inferior pelvic hematoma extending down to the anterior pelvic floor bilaterally. Eliquis held, reversed with Kcentra. Hematoma stable on CT C/A/P 4/19. CBC pending. ?R knee pain - xray ?Hx DM2 - SSI ?Hx CAD  ?Hx COPD - prn nebs. Home meds. On o2.  ?Hx CVA  ?Hx HTN - home meds. Hold PM dose Entresto   ?Hx HLD ?HX GERD - PPI ?Hx OSA - refuses CPAP - reports he does not wear at home ?Hx 2nd degree heart block s/p pacemaker ?PAF on Eliquis - Eliquis on hold. Will plan for our team to discuss w/ Cards prior to discharge if should continue Eliquis or discontinue at d/c given recent fall ?Hx CHF - EF 35% on echo 2021. Holding lasix in setting of soft bp  ?Hx CKD - defer staging to PCP. Last Cr before admission 1.93 on labs 10/15/21. 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 PCP f/u ?FEN - diet, D/C IVF ?VTE - SCDs, on hold. Thrombocytopenia - CBC pending ?ID - None currently  ?Foley - TOV today ?Plan - check labs, PT/OT, anticipate SNF. ? ? LOS: 0 days  ? ? ?Georganna Skeans, MD, MPH, FACS ?Trauma & General Surgery ?Use [email or web address] to contact on call provider ? ?12/12/2021  ?

## 2021-12-12 NOTE — Progress Notes (Signed)
Physical Therapy Treatment ?Patient Details ?Name: [name] ?MRN: [id number] ?DOB: 27-Jun-1933 ?Today's Date: 12/12/2021 ? ? ?History of Present Illness Pt is an 88 year old male presented as a level 2 trauma after sustaining a ground-level fall.  Pt found to have C6 fx and pubic rami fx with plans for nonoperative mgmt. PMH: CAD, COPD, CVA, GERD, HTN, hx of CABG, PSA, a fib, hx of prostate cancer, DM, vertigo. ? ?  ?PT Comments  ? ? Patient progressing very slowly due to significant pain with mobilization.  He still needs +2 A due to pain and poor tolerance with movement.  Encouraged R LE ROM as tolerated to help mobilize hematoma and reabsorption which may also improve sitting tolerance.  Patient remains appropriate for SNF level rehab at d/c.    ?Recommendations for follow up therapy are one component of a multi-disciplinary discharge planning process, led by the attending physician.  Recommendations may be updated based on patient status, additional functional criteria and insurance authorization. ? ?Follow Up Recommendations ? Skilled nursing-short term rehab (<3 hours/day) ?  ?  ?Assistance Recommended at Discharge Frequent or constant Supervision/Assistance  ?Patient can return home with the following Two people to help with walking and/or transfers;Two people to help with bathing/dressing/bathroom;Assistance with cooking/housework;Assist for transportation;Help with stairs or ramp for entrance;Direct supervision/assist for medications management;Direct supervision/assist for financial management ?  ?Equipment Recommendations ? None recommended by PT  ?  ?Recommendations for Other Services   ? ? ?  ?Precautions / Restrictions Precautions ?Precautions: Fall;Cervical ?Precaution Comments: watch BP ?Required Braces or Orthoses: Cervical Brace ?Cervical Brace: Hard collar ?Restrictions ?RLE Weight Bearing: Weight bearing as tolerated ?LLE Weight Bearing: Weight bearing as tolerated  ?  ? ?Mobility ? Bed  Mobility ?Overal bed mobility: Needs Assistance ?Bed Mobility: Supine to Sit, Sit to Supine ?  ?  ?Supine to sit: HOB elevated, Max assist, +2 for physical assistance ?Sit to supine: Max assist, +2 for physical assistance ?  ?General bed mobility comments: assist for both legs to EOB and lifting trunk with pt moaning in pain with R hip with movement; to supine assist for both legs and to lower trunk, reported less pain than previously; attempted to roll to R slightly to straighten pad, but too painful with pt moaning again and red in the face ?  ? ?Transfers ?Overall transfer level: Needs assistance ?Equipment used: Rolling walker (2 wheels) ?Transfers: Sit to/from Stand ?Sit to Stand: Mod assist, +2 safety/equipment ?  ?  ?  ?  ?  ?General transfer comment: assist for lifting to stand from EOB to RW, but too painful to take steps or remain standing so returned to sit; performed 2 lateral scoots to get closer to [facility] again with significant pain, but only min/mod A. ?  ? ?Ambulation/Gait ?  ?  ?  ?  ?  ?  ?  ?General Gait Details: unable ? ? ?Stairs ?  ?  ?  ?  ?  ? ? ?Wheelchair Mobility ?  ? ?Modified Rankin (Stroke Patients Only) ?  ? ? ?  ?Balance Overall balance assessment: Needs assistance ?Sitting-balance support: Feet supported ?Sitting balance-Leahy Scale: Fair ?Sitting balance - Comments: EOB with S and UE support due to pelvic pain ?  ?Standing balance support: Bilateral upper extremity supported ?Standing balance-Leahy Scale: Poor ?Standing balance comment: UE support needed in standing and min A of 2 ?  ?  ?  ?  ?  ?  ?  ?  ?  ?  ?  ?  ? ?  ?  Cognition Arousal/Alertness: Awake/alert ?Behavior During Therapy: [facility] for tasks assessed/performed ?Overall Cognitive Status: Within Functional Limits for tasks assessed ?  ?  ?  ?  ?  ?  ?  ?  ?  ?  ?  ?  ?  ?  ?  ?  ?General Comments: remembered hitting his head when he fell, but states the scans did not show any brain bleeding ?  ?  ? ?  ?Exercises General  Exercises - Lower Extremity ?Ankle Circles/Pumps: AROM, Both, 5 reps, Supine ?Heel Slides: AAROM, Both, 5 reps, Supine ? ?  ?General Comments General comments (skin integrity, edema, etc.): BP not checked in standing due to pain; wife and neice in the room and inquisitive throughout about procedures, patient's injuries, etc. ?  ?  ? ?Pertinent Vitals/Pain Pain Assessment ?Faces Pain Scale: Hurts worst ?Pain Location: R hio with movement ?Pain Descriptors / Indicators: Grimacing, Guarding, Moaning ?Pain Intervention(s): RN gave pain meds during session, Monitored during session, Limited activity within patient's tolerance  ? ? ?Home Living   ?  ?  ?  ?  ?  ?  ?  ?  ?  ?   ?  ?Prior Function    ?  ?  ?   ? ?PT Goals (current goals can now be found in the care plan section) Progress towards PT goals: Progressing toward goals ? ?  ?Frequency ? ? ? Min 3X/week ? ? ? ?  ?PT Plan Current plan remains appropriate  ? ? ?Co-evaluation   ?  ?  ?  ?  ? ?  ?AM-PAC PT "6 Clicks" Mobility   ?Outcome Measure ? Help needed turning from your back to your side while in a flat bed without using bedrails?: Total ?Help needed moving from lying on your back to sitting on the side of a flat bed without using bedrails?: Total ?Help needed moving to and from a bed to a chair (including a wheelchair)?: Total ?Help needed standing up from a chair using your arms (e.g., wheelchair or bedside chair)?: Total ?Help needed to walk in hospital room?: Total ?Help needed climbing 3-5 steps with a railing? : Total ?6 Click Score: 6 ? ?  ?End of Session Equipment Utilized During Treatment: Gait belt;Cervical collar ?Activity Tolerance: Patient limited by pain ?Patient left: in bed;with call bell/phone within reach;with family/visitor present ?  ?PT Visit Diagnosis: Pain;History of falling (Z91.81);Muscle weakness (generalized) (M62.81);Other abnormalities of gait and mobility (R26.89) ?Pain - Right/Left: Right ?Pain - part of body: Hip ?  ? ? ?Time:  1233-1300 ?PT Time Calculation (min) (ACUTE ONLY): 27 min ? ?Charges:  $Therapeutic Activity: 23-37 mins          ?          ? ?[name], PT ?Acute Rehabilitation Services ?SUGA[place]:[phone] ?[phone] ?12/12/2021 ? ? ? ?Reginia Naas ?12/12/2021, 5:58 PM ? ?

## 2021-12-12 NOTE — Progress Notes (Signed)
Occupational Therapy Treatment ?Patient Details ?Name: [name] ?MRN: [id number] ?DOB: 07/28/1933 ?Today's Date: 12/12/2021 ? ? ?History of present illness Pt is an 88 year old male presented as a level 2 trauma after sustaining a ground-level fall.  Pt found to have C6 fx and pubic rami fx with plans for nonoperative mgmt. PMH: CAD, COPD, CVA, GERD, HTN, hx of CABG, PSA, a fib, hx of prostate cancer, DM, vertigo. ?  ?OT comments ? Patient continues to be limited by pain, and found sitting in recliner requesting to be placed back to bed.  STEDY used to assist patient back to bed.  Total assist for sit to supine, and near total assist for LB ADL at sitting level.  OT will continue efforts in the acute setting, with SNF recommended for post acute rehab prior to returning home.    ? ?Recommendations for follow up therapy are one component of a multi-disciplinary discharge planning process, led by the attending physician.  Recommendations may be updated based on patient status, additional functional criteria and insurance authorization. ?   ?Follow Up Recommendations ? Skilled nursing-short term rehab (<3 hours/day)  ?  ?Assistance Recommended at Discharge Frequent or constant Supervision/Assistance  ?Patient can return home with the following ? A lot of help with bathing/dressing/bathroom;A lot of help with walking and/or transfers ?  ?Equipment Recommendations ? Wheelchair (measurements OT);Wheelchair cushion (measurements OT)  ?  ?Recommendations for Other Services   ? ?  ?Precautions / Restrictions Precautions ?Precautions: Fall;Other (comment);Cervical ?Required Braces or Orthoses: Cervical Brace ?Cervical Brace: Hard collar ?Restrictions ?Weight Bearing Restrictions: Yes ?RLE Weight Bearing: Weight bearing as tolerated ?LLE Weight Bearing: Weight bearing as tolerated  ? ? ?  ? ?Mobility Bed Mobility ?  ?Bed Mobility: Sit to Supine ?  ?  ?  ?Sit to supine: Total assist ?  ?  ?Patient Response:  Cooperative ? ?Transfers ?Overall transfer level: Needs assistance ?Equipment used: Ambulation equipment used ?Transfers: Sit to/from Stand, Bed to chair/wheelchair/BSC ?Sit to Stand: Min assist ?  ?  ?  ?  ?  ?  ?Transfer via Lift Equipment: Stedy ?  ?Balance   ?Sitting-balance support: Feet supported, Single extremity supported ?Sitting balance-Leahy Scale: Fair ?  ?  ?  ?  ?  ?  ?  ?  ?  ?  ?  ?  ?  ?  ?  ?  ?   ? ?ADL either performed or assessed with clinical judgement  ? ?ADL   ?  ?  ?  ?  ?  ?  ?  ?  ?  ?  ?Lower Body Dressing: Maximal assistance;Sit to/from stand ?  ?  ?  ?Toileting- Clothing Manipulation and Hygiene: Maximal assistance;Sit to/from stand ?  ?  ?  ?  ?  ?  ? ?Extremity/Trunk Assessment Upper Extremity Assessment ?Upper Extremity Assessment: Overall WFL for tasks assessed ?  ?Lower Extremity Assessment ?Lower Extremity Assessment: Defer to PT evaluation ?  ?Cervical / Trunk Assessment ?Cervical / Trunk Assessment: Kyphotic ?  ? ?Vision   ?Vision Assessment?: No apparent visual deficits ?  ?Perception Perception ?Perception: Not tested ?  ?Praxis Praxis ?Praxis: Not tested ?  ? ?Cognition Arousal/Alertness: Awake/alert ?Behavior During Therapy: [facility] for tasks assessed/performed ?Overall Cognitive Status: No family/caregiver present to determine baseline cognitive functioning ?  ?  ?  ?  ?  ?  ?  ?  ?  ?  ?  ?  ?  ?  ?  ?  ?  ?  ?  ?   ?   ? ?  ?   ? ? ?  ?    ? ? ?  Pertinent Vitals/ Pain       Pain Assessment ?Pain Assessment: Faces ?Faces Pain Scale: Hurts whole lot ?Pain Location: R hip ?Pain Descriptors / Indicators: Grimacing, Guarding, Sore ?Pain Intervention(s): Monitored during session ? ?   ?  ?  ?  ?  ?  ?  ?  ?  ?  ?  ?  ?  ?  ?  ?  ?  ?  ?  ? ?  ?    ?  ?  ?  ?   ? ?Frequency ? Min 2X/week  ? ? ? ? ?  ?Progress Toward Goals ? ?OT Goals(current goals can now be found in the care plan section) ? Progress towards OT goals: Progressing toward goals ? ?Acute Rehab OT Goals ?Time For Goal  Achievement: 12/24/21 ?Potential to Achieve Goals: Good  ?Plan Discharge plan remains appropriate   ? ?Co-evaluation ? ? ?   ?  ?  ?  ?  ? ?  ?AM-PAC OT "6 Clicks" Daily Activity     ?Outcome Measure ? ? Help from another person eating meals?: None ?Help from another person taking care of personal grooming?: A Little ?Help from another person toileting, which includes using toliet, bedpan, or urinal?: A Lot ?Help from another person bathing (including washing, rinsing, drying)?: A Lot ?Help from another person to put on and taking off regular upper body clothing?: A Little ?Help from another person to put on and taking off regular lower body clothing?: A Lot ?6 Click Score: 16 ? ?  ?End of Session Equipment Utilized During Treatment: Other (comment) ? ?OT Visit Diagnosis: Unsteadiness on feet (R26.81);Other abnormalities of gait and mobility (R26.89);Muscle weakness (generalized) (M62.81);History of falling (Z91.81) ?  ?Activity Tolerance Patient limited by pain ?  ?Patient Left in bed;with call bell/phone within reach;with bed alarm set ?  ?Nurse Communication Mobility status;Other (comment);Precautions;Weight bearing status ?  ? ?   ? ?Time: 3149-7026 ?OT Time Calculation (min): 22 min ? ?Charges: OT General Charges ?$OT Visit: 1 Visit ?OT Treatments ?$Self Care/Home Management : 8-22 mins ? ?12/12/2021 ? ?RP, OTR/L ? ?Acute Rehabilitation Services ? ?Office:  [phone] ? ? ?Draylen Lobue D Kimia Finan ?12/12/2021, 10:10 AM ?

## 2021-12-13 DIAGNOSIS — S12590A Other displaced fracture of sixth cervical vertebra, initial encounter for closed fracture: Secondary | ICD-10-CM | POA: Diagnosis not present

## 2021-12-13 DIAGNOSIS — S32591A Other specified fracture of right pubis, initial encounter for closed fracture: Secondary | ICD-10-CM | POA: Diagnosis not present

## 2021-12-13 LAB — CBC
HCT: 23.1 % — ABNORMAL LOW (ref 39.0–52.0)
Hemoglobin: 7.5 g/dL — ABNORMAL LOW (ref 13.0–17.0)
MCH: 32.1 pg (ref 26.0–34.0)
MCHC: 32.5 g/dL (ref 30.0–36.0)
MCV: 98.7 fL (ref 80.0–100.0)
Platelets: 65 10*3/uL — ABNORMAL LOW (ref 150–400)
RBC: 2.34 MIL/uL — ABNORMAL LOW (ref 4.22–5.81)
RDW: 15.6 % — ABNORMAL HIGH (ref 11.5–15.5)
WBC: 6.8 10*3/uL (ref 4.0–10.5)
nRBC: 0 % (ref 0.0–0.2)

## 2021-12-13 LAB — BASIC METABOLIC PANEL
Anion gap: 4 — ABNORMAL LOW (ref 5–15)
BUN: 24 mg/dL — ABNORMAL HIGH (ref 8–23)
CO2: 25 mmol/L (ref 22–32)
Calcium: 9.1 mg/dL (ref 8.9–10.3)
Chloride: 109 mmol/L (ref 98–111)
Creatinine, Ser: 1.25 mg/dL — ABNORMAL HIGH (ref 0.61–1.24)
GFR, Estimated: 56 mL/min — ABNORMAL LOW (ref >60)
Glucose, Bld: 122 mg/dL — ABNORMAL HIGH (ref 70–99)
Potassium: 4.3 mmol/L (ref 3.5–5.1)
Sodium: 138 mmol/L (ref 135–145)

## 2021-12-13 LAB — GLUCOSE, CAPILLARY
Glucose-Capillary: 108 mg/dL — ABNORMAL HIGH (ref 70–99)
Glucose-Capillary: 111 mg/dL — ABNORMAL HIGH (ref 70–99)
Glucose-Capillary: 122 mg/dL — ABNORMAL HIGH (ref 70–99)
Glucose-Capillary: 123 mg/dL — ABNORMAL HIGH (ref 70–99)
Glucose-Capillary: 131 mg/dL — ABNORMAL HIGH (ref 70–99)
Glucose-Capillary: 155 mg/dL — ABNORMAL HIGH (ref 70–99)
Glucose-Capillary: 162 mg/dL — ABNORMAL HIGH (ref 70–99)

## 2021-12-13 NOTE — Progress Notes (Signed)
Mobility Specialist: Progress Note ? ? 12/13/21 1700  ?Mobility  ?Activity Ambulated with assistance in hallway  ?Level of Assistance Moderate assist, patient does 50-74%  ?Assistive Device Front wheel walker  ?RLE Weight Bearing WBAT  ?LLE Weight Bearing WBAT  ?Distance Ambulated (ft) 16 ft ?(6'+10')  ?Activity Response Tolerated well  ?$Mobility charge 1 Mobility  ? ?Pt received in bed and agreeable to ambulation. ModA for bed mobility as well as to stand. Stopped x2 for seated breaks secondary to RLE and back pain. R knee buckling noted with increased distance. Pt wheeled back to the room after session and remains in the recliner with call bell at his side. Chair alarm is on. Pt's daughter present in the room.  ? ?[name] ?Mobility Specialist ?Mobility Specialist [place]: [phone] ?Mobility Specialist [place]: [phone] ? ?

## 2021-12-13 NOTE — Progress Notes (Signed)
? ?Trauma/Critical Care Follow Up Note ? ?Subjective:  ?  ?Overnight Issues:  ? ?Objective:  ?Vital signs for last 24 hours: ?Temp:  [97.4 ?F (36.3 ?C)-98.7 ?F (37.1 ?C)] 97.8 ?F (36.6 ?C) (04/21 0729) ?Pulse Rate:  [63-74] 66 (04/21 0729) ?Resp:  [16-19] 17 (04/21 0729) ?BP: (95-144)/(43-55) 126/55 (04/21 0729) ?SpO2:  [88 %-98 %] 88 % (04/21 0902) ? ?Hemodynamic parameters for last 24 hours: ?  ? ?Intake/Output from previous day: ?04/20 0701 - 04/21 0700 ?In: 200 [P.O.:200] ?Out: 1 [Urine:1]  ?Intake/Output this shift: ?No intake/output data recorded. ? ?Vent settings for last 24 hours: ?  ? ?Physical Exam:  ?Gen: comfortable, no distress ?Neuro: non-focal exam ?HEENT: PERRL ?Neck: c-collar ?CV: RRR ?Pulm: unlabored breathing on RA ?Abd: soft, NT ?GU: spont voids ?Extr: wwp, no edema ? ? ?Results for orders placed or performed during the hospital encounter of 12/10/21 (from the past 24 hour(s))  ?CBC     Status: Abnormal  ? Collection Time: 12/12/21 10:25 AM  ?Result Value Ref Range  ? WBC 7.3 4.0 - 10.5 K/uL  ? RBC 2.53 (L) 4.22 - 5.81 MIL/uL  ? Hemoglobin 8.0 (L) 13.0 - 17.0 g/dL  ? HCT 25.6 (L) 39.0 - 52.0 %  ? MCV 101.2 (H) 80.0 - 100.0 fL  ? MCH 31.6 26.0 - 34.0 pg  ? MCHC 31.3 30.0 - 36.0 g/dL  ? RDW 15.7 (H) 11.5 - 15.5 %  ? Platelets 67 (L) 150 - 400 K/uL  ? nRBC 0.0 0.0 - 0.2 %  ?Basic metabolic panel     Status: Abnormal  ? Collection Time: 12/12/21 10:25 AM  ?Result Value Ref Range  ? Sodium 139 135 - 145 mmol/L  ? Potassium 4.5 3.5 - 5.1 mmol/L  ? Chloride 111 98 - 111 mmol/L  ? CO2 24 22 - 32 mmol/L  ? Glucose, Bld 174 (H) 70 - 99 mg/dL  ? BUN 26 (H) 8 - 23 mg/dL  ? Creatinine, Ser 1.46 (H) 0.61 - 1.24 mg/dL  ? Calcium 9.0 8.9 - 10.3 mg/dL  ? GFR, Estimated 47 (L) >60 mL/min  ? Anion gap 4 (L) 5 - 15  ?Glucose, capillary     Status: Abnormal  ? Collection Time: 12/12/21 11:31 AM  ?Result Value Ref Range  ? Glucose-Capillary 170 (H) 70 - 99 mg/dL  ?Glucose, capillary     Status: Abnormal  ? Collection  Time: 12/12/21  4:12 PM  ?Result Value Ref Range  ? Glucose-Capillary 161 (H) 70 - 99 mg/dL  ?Glucose, capillary     Status: Abnormal  ? Collection Time: 12/12/21  7:50 PM  ?Result Value Ref Range  ? Glucose-Capillary 139 (H) 70 - 99 mg/dL  ?Glucose, capillary     Status: Abnormal  ? Collection Time: 12/12/21 11:24 PM  ?Result Value Ref Range  ? Glucose-Capillary 123 (H) 70 - 99 mg/dL  ?CBC     Status: Abnormal  ? Collection Time: 12/13/21  3:07 AM  ?Result Value Ref Range  ? WBC 6.8 4.0 - 10.5 K/uL  ? RBC 2.34 (L) 4.22 - 5.81 MIL/uL  ? Hemoglobin 7.5 (L) 13.0 - 17.0 g/dL  ? HCT 23.1 (L) 39.0 - 52.0 %  ? MCV 98.7 80.0 - 100.0 fL  ? MCH 32.1 26.0 - 34.0 pg  ? MCHC 32.5 30.0 - 36.0 g/dL  ? RDW 15.6 (H) 11.5 - 15.5 %  ? Platelets 65 (L) 150 - 400 K/uL  ? nRBC 0.0 0.0 - 0.2 %  ?  Basic metabolic panel     Status: Abnormal  ? Collection Time: 12/13/21  3:07 AM  ?Result Value Ref Range  ? Sodium 138 135 - 145 mmol/L  ? Potassium 4.3 3.5 - 5.1 mmol/L  ? Chloride 109 98 - 111 mmol/L  ? CO2 25 22 - 32 mmol/L  ? Glucose, Bld 122 (H) 70 - 99 mg/dL  ? BUN 24 (H) 8 - 23 mg/dL  ? Creatinine, Ser 1.25 (H) 0.61 - 1.24 mg/dL  ? Calcium 9.1 8.9 - 10.3 mg/dL  ? GFR, Estimated 56 (L) >60 mL/min  ? Anion gap 4 (L) 5 - 15  ?Glucose, capillary     Status: Abnormal  ? Collection Time: 12/13/21  3:10 AM  ?Result Value Ref Range  ? Glucose-Capillary 131 (H) 70 - 99 mg/dL  ?Glucose, capillary     Status: Abnormal  ? Collection Time: 12/13/21  7:29 AM  ?Result Value Ref Range  ? Glucose-Capillary 108 (H) 70 - 99 mg/dL  ? ? ?Assessment & Plan: ? ?Present on Admission: ? Trauma ? ? ? LOS: 0 days  ? ?Additional comments:I reviewed the patient's new clinical lab test results.   and I reviewed the patients new imaging test results.   ? ?GLF on Eliquis  ? ?C6 vertebral body fx - Per NSGY, Dr. Kathyrn Sheriff. Cont collar till final recs ?R sup/inf pubic rami fx - Per Ortho, consult pending.  ?Pelvic Hematoma - CT pelvis w/ Pelvic hemoperitoneum with 8.3 x  5.6 x 3.2 cm anterior inferior pelvic hematoma extending down to the anterior pelvic floor bilaterally. Eliquis held, reversed with Kcentra. Hematoma stable on CT C/A/P 4/19. Hgb 7.5 from 8 yest ?R knee pain - xray ?Hx DM2 - SSI ?Hx CAD  ?Hx COPD - prn nebs. Home meds. On o2.  ?Hx CVA  ?Hx HTN - home meds. Hold PM dose Entresto  ?Hx HLD ?HX GERD - PPI ?Hx OSA - refuses CPAP - reports he does not wear at home ?Hx 2nd degree heart block s/p pacemaker ?PAF on Eliquis - Eliquis on hold. Will plan for our team to discuss w/ Cards prior to discharge if should continue Eliquis or discontinue at d/c given recent fall ?Hx CHF - EF 35% on echo 2021. Holding lasix in setting of soft bp  ?Hx CKD - defer staging to PCP. Last Cr before admission 1.93 on labs 10/15/21. 1.25 today ?Incedential findings - Iliofemoral atherosclerosis with 2.1 cm stable aneurysmal prominence of the left common iliac artery. PCP f/u ?FEN - diet ?VTE - SCDs, LMWH on hold. Thrombocytopenia - stable ?Foley - TOV today ?Plan - PT/OT, anticipate SNF ? ?Jesusita Oka, MD ?Trauma & General Surgery ?Please use [email or web address] to contact on call provider ? ?12/13/2021 ? ?*Care during the described time interval was provided by me. I have reviewed this patient's available data, including medical history, events of note, physical examination and test results as part of my evaluation. ? ? ? ?

## 2021-12-13 NOTE — [place] FL2 (Signed)
?[place] MEDICAID FL2 LEVEL OF CARE SCREENING TOOL  ?  ? ?IDENTIFICATION  ?Patient Name: ?[name] Birthdate: 11/05/1932 Sex: male Admission Date (Current Location): ?12/10/2021  ?[place] and [place] Number: ? [place] ?  Facility and Address:  ?[facility], [place] [address] [place], [place] [address] ?     Provider Number: ?[id number]  ?Attending Physician Name and Address:  ?Md, Trauma, MD ? Relative Name and Phone Number:  ?[name], daughter [phone] ?   ?Current Level of Care: ?Hospital Recommended Level of Care: ?[place] Prior Approval Number: ?  ? ?Date Approved/Denied: ?  PASRR Number: ?[id number] A ? ?Discharge Plan: ?SNF ?  ? ?Current Diagnoses: ?Patient Active Problem List  ? Diagnosis Date Noted  ? Trauma 12/10/2021  ? Complicated UTI (urinary tract infection) 06/23/2021  ? HFrEF (heart failure with reduced ejection fraction) ([place]) 06/23/2021  ? AKI (acute kidney injury) ([place]) 06/23/2021  ? Urinary tract infection without hematuria 06/14/2021  ? Urine discoloration 06/04/2021  ? Dysuria 06/04/2021  ? COVID-19 virus RNA test result positive at limit of detection 02/01/2021  ? Upper respiratory infection with cough and congestion 02/01/2021  ? Kidney stones 07/04/2020  ? Hyperlipidemia associated with type 2 diabetes mellitus ([place]) 07/05/2019  ? Paroxysmal atrial fibrillation ([place]) 06/29/2019  ? Acquired thrombophilia ([place]) 06/29/2019  ? Second degree Mobitz II AV block 02/22/2019  ? B12 deficiency 12/18/2017  ? Status post right knee replacement 11/10/2016  ? S/P repair of abdominal aortic aneurysm using bifurcation graft 10/03/2016  ? Mixed incontinence 04/03/2015  ? PVC's (premature ventricular contractions) 06/05/2014  ? BMI 29.0-29.9,adult 05/17/2014  ? Diastolic dysfunction- grade 1 by echo June 2015, EF 50% 03/28/2014  ? OSA (obstructive sleep apnea) 12/07/2013  ? Sinus bradycardia- ? symptomatic 05/17/2013  ? Coronary artery disease involving  nonautologous biological coronary bypass graft without angina pectoris   ? Thrombocytopenia ([place])   ? S/P CABG x 3 08/21/2011  ? Hypertensive cardiovascular disease   ? Prostate cancer ([place])   ? Hyperlipidemia with target LDL less than 100   ? Diabetes ([place]) 11/28/2010  ? ? ?Orientation RESPIRATION BLADDER Height & Weight   ?  ?Self, Place ? Normal External catheter Weight: 88.3 kg ?Height:  '5\' 11"'$  (180.3 cm)  ?BEHAVIORAL SYMPTOMS/MOOD NEUROLOGICAL BOWEL NUTRITION STATUS  ?    Incontinent Diet (carb modified thin liquid)  ?AMBULATORY STATUS COMMUNICATION OF NEEDS Skin   ?Extensive Assist Verbally Skin abrasions, Bruising (bilateral knees/legs) ?  ?  ?  ?    ?     ?     ? ? ?Personal Care Assistance Level of Assistance  ?Feeding Bathing Assistance: Maximum assistance ?Feeding assistance: Limited assistance ?Dressing Assistance: Maximum assistance ?   ? ?Functional Limitations Info  ?    ?  ?   ? ? ?SPECIAL CARE FACTORS FREQUENCY  ?PT (By licensed PT), OT (By licensed OT)   ?  ?PT Frequency: 5x weekly ?OT Frequency: 5x weekly ?  ?  ?  ?   ? ? ?Contractures Contractures Info: Not present  ? ? ?Additional Factors Info  ?Insulin Sliding Scale, Code Status, Allergies Code Status Info: Full ?Allergies Info: Penicillins-unknown reaction; Tramadol- dizzy; Ketorolac-rash; Tromethamine-unknown reaction ?  ?Insulin Sliding Scale Info: Novolog 0-15 units q 4h ?  ?   ? ?Current Medications (12/13/2021):  This is the current hospital active medication list ?Current Facility-Administered Medications  ?Medication Dose Route Frequency Provider Last Rate Last Admin  ? acetaminophen (TYLENOL)  tablet 1,000 mg  1,000 mg Oral Q6H Jesusita Oka, MD   1,000 mg at 12/13/21 3845  ? albuterol (PROVENTIL) (2.5 MG/3ML) 0.083% nebulizer solution 3 mL  3 mL Inhalation Q6H PRN Clovis Riley, MD      ? amiodarone (PACERONE) tablet 200 mg  200 mg Oral Daily Romana Juniper A, MD   200 mg at 12/13/21 3646  ? bisacodyl (DULCOLAX) suppository 10  mg  10 mg Rectal Daily PRN Clovis Riley, MD      ? Chlorhexidine Gluconate Cloth 2 % PADS 6 each  6 each Topical Daily Lisette Abu, PA-C   6 each at 12/13/21 8032  ? docusate sodium (COLACE) capsule 100 mg  100 mg Oral BID Romana Juniper A, MD   100 mg at 12/13/21 1224  ? fluticasone furoate-vilanterol (BREO ELLIPTA) 200-25 MCG/ACT 1 puff  1 puff Inhalation Daily Romana Juniper A, MD   1 puff at 12/13/21 0900  ? HYDROmorphone (DILAUDID) injection 0.25 mg  0.25 mg Intravenous Q6H PRN Jesusita Oka, MD   0.25 mg at 12/12/21 1609  ? insulin aspart (novoLOG) injection 0-15 Units  0-15 Units Subcutaneous Q4H Clovis Riley, MD   3 Units at 12/13/21 1209  ? methocarbamol (ROBAXIN) tablet 500 mg  500 mg Oral QID Jesusita Oka, MD   500 mg at 12/13/21 8250  ? ondansetron (ZOFRAN-ODT) disintegrating tablet 4 mg  4 mg Oral Q6H PRN Clovis Riley, MD      ? Or  ? ondansetron (ZOFRAN) injection 4 mg  4 mg Intravenous Q6H PRN Romana Juniper A, MD      ? oxyCODONE (Oxy IR/ROXICODONE) immediate release tablet 2.5-5 mg  2.5-5 mg Oral Q4H PRN Jesusita Oka, MD   5 mg at 12/13/21 0370  ? pantoprazole (PROTONIX) EC tablet 40 mg  40 mg Oral Daily Romana Juniper A, MD   40 mg at 12/13/21 4888  ? venlafaxine XR (EFFEXOR-XR) 24 hr capsule 37.5 mg  37.5 mg Oral Q breakfast Rozann Lesches, RPH   37.5 mg at 12/13/21 9169  ? ? ? ?Discharge Medications: ?Please see discharge summary for a list of discharge medications. ? ?Relevant Imaging Results: ? ?Relevant Lab Results: ? ? ?Additional Information ?SS# [id number] ? ?Reinaldo Raddle, RN, BSN  ?Trauma/Neuro ICU Case Manager ?[phone] ? ? ? ? ?

## 2021-12-13 NOTE — TOC Initial Note (Signed)
Transition of Care (TOC) - Initial/Assessment Note  ? ? ?Patient Details  ?Name: [name] ?MRN: [id number] ?Date of Birth: 1933-06-12 ? ?Transition of Care [facility]) CM/SW Contact:    ?Ella Bodo, RN ?Phone Number: ?12/13/2021, 2:35 PM ? ?Clinical Narrative:                 ?Pt is an 88 year old male presented as a level 2 trauma after sustaining a ground-level fall.  Pt found to have C6 fx and pubic rami fx with plans for nonoperative mgmt. PMH: CAD, COPD, CVA, GERD, HTN, hx of CABG, PSA, a fib, hx of prostate cancer, DM, vertigo. ?PTA, pt needs assistance with ADLS; lives with spouse.  PT/OT recommending SNF for rehab prior to dc home.  Met with family(wife and niece) to discuss SNF recommendation; they prefer a facility in [place] or [place].  Will initiate FL2 and fax out for bed search.  Also sent referral to [facility], per family request; fax # [phone], Earlie Server.  ? ?Expected Discharge Plan: [place] ?Barriers to Discharge: Continued Medical Work up, SNF Pending bed offer ? ? ?Patient Goals and CMS Choice ?  ?CMS Medicare.gov Compare Post Acute Care list provided to:: Patient Represenative (must comment) (wife) ?Choice offered to / list presented to : Spouse ? ?Expected Discharge Plan and Services ?Expected Discharge Plan: [place] ?  ?Discharge Planning Services: CM Consult ?Post Acute Care Choice: [place] ?Living arrangements for the past 2 months: [place] ?                ?  ?  ?  ?  ?  ?  ?  ?  ?  ?  ? ?Prior Living Arrangements/Services ?Living arrangements for the past 2 months: [place] ?Lives with:: Spouse ?Patient language and need for interpreter reviewed:: Yes ?Do you feel safe going back to the place where you live?: Yes      ?Need for Family Participation in Patient Care: Yes (Comment) ?Care giver support system in place?: Yes (comment) ?  ?Criminal Activity/Legal Involvement Pertinent to Current  Situation/Hospitalization: No - Comment as needed ? ?Activities of Daily Living ?Home Assistive Devices/Equipment: Cane (specify quad or straight) ?ADL Screening (condition at time of admission) ?Patient's cognitive ability adequate to safely complete daily activities?: Yes ?Is the patient deaf or have difficulty hearing?: No ?Does the patient have difficulty seeing, even when wearing glasses/contacts?: No ?Does the patient have difficulty concentrating, remembering, or making decisions?: No ?Patient able to express need for assistance with ADLs?: Yes ?Does the patient have difficulty dressing or bathing?: No ?Independently performs ADLs?: Yes (appropriate for developmental age) ?Does the patient have difficulty walking or climbing stairs?: No ?Weakness of Legs: Both ?Weakness of Arms/Hands: None ? ?Permission Sought/Granted ?  ?Permission granted to share information with : Yes, Verbal Permission Granted ? Share Information with NAME: wife ?   ?   ? Permission granted to share info w Contact Information: at bedside ? ?Emotional Assessment ?Appearance:: Appears stated age ?  ?Affect (typically observed): Appropriate ?Orientation: : Oriented to Self, Oriented to Situation ?  ?  ? ?Admission diagnosis:  Trauma [T14.90XA] ?Closed fracture of right pubis, unspecified portion of pubis, initial encounter ([place]) [S32.501A] ?Closed nondisplaced fracture of sixth cervical vertebra, unspecified fracture morphology, initial encounter ([place]) [S12.501A] ?Patient Active Problem List  ? Diagnosis Date Noted  ? Trauma 12/10/2021  ? Complicated UTI (urinary tract infection) 06/23/2021  ? HFrEF (  heart failure with reduced ejection fraction) ([place]) 06/23/2021  ? AKI (acute kidney injury) ([place]) 06/23/2021  ? Urinary tract infection without hematuria 06/14/2021  ? Urine discoloration 06/04/2021  ? Dysuria 06/04/2021  ? COVID-19 virus RNA test result positive at limit of detection 02/01/2021  ? Upper respiratory infection with cough and  congestion 02/01/2021  ? Kidney stones 07/04/2020  ? Hyperlipidemia associated with type 2 diabetes mellitus ([place]) 07/05/2019  ? Paroxysmal atrial fibrillation ([place]) 06/29/2019  ? Acquired thrombophilia ([place]) 06/29/2019  ? Second degree Mobitz II AV block 02/22/2019  ? B12 deficiency 12/18/2017  ? Status post right knee replacement 11/10/2016  ? S/P repair of abdominal aortic aneurysm using bifurcation graft 10/03/2016  ? Mixed incontinence 04/03/2015  ? PVC's (premature ventricular contractions) 06/05/2014  ? BMI 29.0-29.9,adult 05/17/2014  ? Diastolic dysfunction- grade 1 by echo June 2015, EF 50% 03/28/2014  ? OSA (obstructive sleep apnea) 12/07/2013  ? Sinus bradycardia- ? symptomatic 05/17/2013  ? Coronary artery disease involving nonautologous biological coronary bypass graft without angina pectoris   ? Thrombocytopenia ([place])   ? S/P CABG x 3 08/21/2011  ? Hypertensive cardiovascular disease   ? Prostate cancer ([place])   ? Hyperlipidemia with target LDL less than 100   ? Diabetes ([place]) 11/28/2010  ? ?PCP:  [name], DO ?Pharmacy:   ?[id number] [place], [place] [address] ?[place] [address] ?Phone: [phone] Fax: [phone] ? ?[facility] ([facility] ) - [place], [place] ?[address] ?[place] [address] ?Phone: [phone] Fax: [phone] ? ?[facility] - [place], [place] [address] ?[place] [address] ?Phone: [phone] Fax: [phone] ? ? ? ? ?Social Determinants of Health (SDOH) Interventions ?  ? ?Readmission Risk Interventions ?   ? View : No data to display.  ?  ?  ?  ? ?Reinaldo Raddle, RN, BSN  ?Trauma/Neuro ICU Case Manager ?[phone] ? ? ?

## 2021-12-13 NOTE — Progress Notes (Signed)
Occupational Therapy Treatment ?Patient Details ?Name: [name] ?MRN: [id number] ?DOB: [birth year] ?Today's Date: 12/13/2021 ? ? ?History of present illness Pt is an 88 year old male presented as a level 2 trauma after sustaining a ground-level fall.  Pt found to have C6 fx and pubic rami fx with plans for nonoperative mgmt. PMH: CAD, COPD, CVA, GERD, HTN, hx of CABG, PSA, a fib, hx of prostate cancer, DM, vertigo. ?  ?OT comments ? Patient continues to be limited by pain and confusion.  He needs extra time and assist for all movements.  Max A for basic bed mobility, and Min Guard sitting at edge of bed to wash face and hands.  Patient only able to tolerate a short amount of time sitting up, and returned to supine with all needs in reach.  OT can continue efforts to maximize any functional gains, and SNF is highly recommended, as family cannot provide the needed 24 hour heavy care currently.  He should progress quickly once his pain lessens.    ? ?Recommendations for follow up therapy are one component of a multi-disciplinary discharge planning process, led by the attending physician.  Recommendations may be updated based on patient status, additional functional criteria and insurance authorization. ?   ?Follow Up Recommendations ? Skilled nursing-short term rehab (<3 hours/day)  ?  ?Assistance Recommended at Discharge Frequent or constant Supervision/Assistance  ?Patient can return home with the following ? A lot of help with bathing/dressing/bathroom;Two people to help with walking and/or transfers;Help with stairs or ramp for entrance;Assist for transportation;Assistance with cooking/housework;Direct supervision/assist for financial management;Direct supervision/assist for medications management ?  ?Equipment Recommendations ? Wheelchair (measurements OT);Wheelchair cushion (measurements OT)  ?  ?Recommendations for Other Services   ? ?  ?Precautions / Restrictions Precautions ?Precautions:  Fall;Cervical ?Required Braces or Orthoses: Cervical Brace ?Cervical Brace: Hard collar ?Restrictions ?Weight Bearing Restrictions: Yes ?RLE Weight Bearing: Weight bearing as tolerated ?LLE Weight Bearing: Weight bearing as tolerated  ? ? ?  ? ?Mobility Bed Mobility ?  ?Bed Mobility: Supine to Sit, Sit to Supine ?  ?  ?Supine to sit: Max assist, HOB elevated ?Sit to supine: Max assist, HOB elevated ?  ?General bed mobility comments: increased time and effort.  Pain to hip with all movements. ?  ? ?Transfers ?  ?  ?  ?  ?  ?  ?  ?  ?  ?General transfer comment: Patient had been up in the recliner, and was recently placed back to bed.  Deferred OOB at the moment. ?  ?  ?Balance Overall balance assessment: Needs assistance ?Sitting-balance support: Feet supported ?Sitting balance-Leahy Scale: Fair ?  ?Postural control: Posterior lean ?  ?  ?  ?  ?  ?  ?  ?  ?  ?  ?  ?  ?  ?  ?   ? ?ADL either performed or assessed with clinical judgement  ? ?ADL   ?  ?  ?Grooming: Min guard;Sitting ?  ?  ?  ?  ?  ?  ?  ?  ?  ?  ?  ?  ?  ?  ?  ?  ?  ?  ? ?Extremity/Trunk Assessment Upper Extremity Assessment ?Upper Extremity Assessment: Overall WFL for tasks assessed ?  ?  ?  ?Cervical / Trunk Assessment ?Cervical / Trunk Assessment: Kyphotic ?  ? ?Vision   ?  ?  ?Perception   ?  ?Praxis   ?  ? ?Cognition Arousal/Alertness: Awake/alert ?Behavior During  Therapy: WFL for tasks assessed/performed ?Overall Cognitive Status: Impaired/Different from baseline ?Area of Impairment: Problem solving, Awareness, Safety/judgement, Following commands, Memory, Attention, Orientation ?  ?  ?  ?  ?  ?  ?  ?  ?Orientation Level: Person ?Current Attention Level: Sustained ?Memory: Decreased short-term memory ?Following Commands: Follows one step commands with increased time ?Safety/Judgement: Decreased awareness of safety ?Awareness: Emergent ?Problem Solving: Slow processing, Decreased initiation, Requires verbal cues, Difficulty sequencing ?General  Comments: remains confused at times.  States he is at home sitting in his rocking chair.  Wife at bedside and endorses continued confusion. ?  ?  ?   ?   ? ?  ?   ? ? ?  ?    ? ? ?Pertinent Vitals/ Pain       Pain Assessment ?Pain Assessment: Faces ?Faces Pain Scale: Hurts whole lot ?Pain Location: R hip with movement ?Pain Descriptors / Indicators: Grimacing, Guarding, Moaning, Sharp ?Pain Intervention(s): Monitored during session ? ?   ?  ?  ?  ?  ?  ?  ?  ?  ?  ?  ?  ?  ?  ?  ?  ?  ?  ?  ? ?  ?    ?  ?  ?  ?   ? ?Frequency ? Min 2X/week  ? ? ? ? ?  ?Progress Toward Goals ? ?OT Goals(current goals can now be found in the care plan section) ? Progress towards OT goals: Progressing toward goals ? ?Acute Rehab OT Goals ?OT Goal Formulation: With patient ?Time For Goal Achievement: 12/24/21 ?Potential to Achieve Goals: Good  ?Plan Discharge plan remains appropriate   ? ?Co-evaluation ? ? ?   ?  ?  ?  ?  ? ?  ?AM-PAC OT "6 Clicks" Daily Activity     ?Outcome Measure ? ? Help from another person eating meals?: None ?Help from another person taking care of personal grooming?: A Little ?Help from another person toileting, which includes using toliet, bedpan, or urinal?: A Lot ?Help from another person bathing (including washing, rinsing, drying)?: A Lot ?Help from another person to put on and taking off regular upper body clothing?: A Little ?Help from another person to put on and taking off regular lower body clothing?: A Lot ?6 Click Score: 16 ? ?  ?End of Session   ? ?OT Visit Diagnosis: Unsteadiness on feet (R26.81);Other abnormalities of gait and mobility (R26.89);Muscle weakness (generalized) (M62.81);History of falling (Z91.81) ?  ?Activity Tolerance Patient limited by pain ?  ?Patient Left in bed;with call bell/phone within reach;with bed alarm set;with family/visitor present ?  ?Nurse Communication Mobility status ?  ? ?   ? ?Time: 2919-1660 ?OT Time Calculation (min): 22 min ? ?Charges: OT General Charges ?$OT  Visit: 1 Visit ?OT Treatments ?$Self Care/Home Management : 8-22 mins ? ?12/13/2021 ? ?RP, OTR/L ? ?Acute Rehabilitation Services ? ?Office:  [phone] ? ? ?Minah Axelrod D Keshona Kartes ?12/13/2021, 2:23 PM ?

## 2021-12-14 DIAGNOSIS — S32591A Other specified fracture of right pubis, initial encounter for closed fracture: Secondary | ICD-10-CM | POA: Diagnosis not present

## 2021-12-14 DIAGNOSIS — S12590A Other displaced fracture of sixth cervical vertebra, initial encounter for closed fracture: Secondary | ICD-10-CM | POA: Diagnosis not present

## 2021-12-14 LAB — CBC
HCT: 23.1 % — ABNORMAL LOW (ref 39.0–52.0)
Hemoglobin: 7.4 g/dL — ABNORMAL LOW (ref 13.0–17.0)
MCH: 31.6 pg (ref 26.0–34.0)
MCHC: 32 g/dL (ref 30.0–36.0)
MCV: 98.7 fL (ref 80.0–100.0)
Platelets: 63 10*3/uL — ABNORMAL LOW (ref 150–400)
RBC: 2.34 MIL/uL — ABNORMAL LOW (ref 4.22–5.81)
RDW: 15.9 % — ABNORMAL HIGH (ref 11.5–15.5)
WBC: 5.6 10*3/uL (ref 4.0–10.5)
nRBC: 0 % (ref 0.0–0.2)

## 2021-12-14 LAB — GLUCOSE, CAPILLARY
Glucose-Capillary: 100 mg/dL — ABNORMAL HIGH (ref 70–99)
Glucose-Capillary: 105 mg/dL — ABNORMAL HIGH (ref 70–99)
Glucose-Capillary: 108 mg/dL — ABNORMAL HIGH (ref 70–99)
Glucose-Capillary: 115 mg/dL — ABNORMAL HIGH (ref 70–99)
Glucose-Capillary: 119 mg/dL — ABNORMAL HIGH (ref 70–99)
Glucose-Capillary: 122 mg/dL — ABNORMAL HIGH (ref 70–99)

## 2021-12-14 MED ORDER — POLYETHYLENE GLYCOL 3350 17 G PO PACK
17.0000 g | PACK | Freq: Every day | ORAL | Status: DC
  Administered 2021-12-14 – 2021-12-16 (×3): 17 g via ORAL
  Filled 2021-12-14 (×2): qty 1

## 2021-12-14 NOTE — Progress Notes (Signed)
? ?Progress Note ? ?   ?Subjective: ?Pt does not appear to have had a BM in several days. Tolerating diet. Alert and oriented this AM although seems a little confused intermittently. Working on SNF placement. No family at bedside this AM.  ? ?Objective: ?Vital signs in last 24 hours: ?Temp:  [97.6 ?F (36.4 ?C)-98.2 ?F (36.8 ?C)] 97.6 ?F (36.4 ?C) (04/22 0746) ?Pulse Rate:  [65-76] 65 (04/22 0303) ?Resp:  [17-20] 20 (04/22 0746) ?BP: (104-130)/(52-63) 104/63 (04/22 0746) ?SpO2:  [92 %-95 %] 93 % (04/22 0303) ?Last BM Date : 12/10/21 ? ?Intake/Output from previous day: ?04/21 0701 - 04/22 0700 ?In: 850 [P.O.:850] ?Out: 500 [Urine:500] ?Intake/Output this shift: ?No intake/output data recorded. ? ?PE: ?Gen: comfortable, no distress ?Neuro: non-focal exam ?HEENT: PERRL ?Neck: c-collar ?CV: RRR ?Pulm: unlabored breathing on RA ?Abd: soft, NT ?GU: spont voids ?Extr: wwp, no edema ? ? ?Lab Results:  ?Recent Labs  ?  12/13/21 ?0307 12/14/21 ?0946  ?WBC 6.8 5.6  ?HGB 7.5* 7.4*  ?HCT 23.1* 23.1*  ?PLT 65* 63*  ? ?BMET ?Recent Labs  ?  12/12/21 ?1025 12/13/21 ?0307  ?NA 139 138  ?K 4.5 4.3  ?CL 111 109  ?CO2 24 25  ?GLUCOSE 174* 122*  ?BUN 26* 24*  ?CREATININE 1.46* 1.25*  ?CALCIUM 9.0 9.1  ? ?PT/INR ?No results for input(s): LABPROT, INR in the last 72 hours. ?CMP  ?   ?Component Value Date/Time  ? NA 138 12/13/2021 0307  ? NA 142 10/15/2021 0927  ? K 4.3 12/13/2021 0307  ? CL 109 12/13/2021 0307  ? CO2 25 12/13/2021 0307  ? GLUCOSE 122 (H) 12/13/2021 6213  ? BUN 24 (H) 12/13/2021 0865  ? BUN 24 10/15/2021 0927  ? CREATININE 1.25 (H) 12/13/2021 7846  ? CREATININE 0.92 03/10/2013 0810  ? CALCIUM 9.1 12/13/2021 0307  ? PROT 6.1 07/01/2021 1523  ? ALBUMIN 3.8 10/15/2021 0927  ? AST 24 07/01/2021 1523  ? ALT 21 07/01/2021 1523  ? ALKPHOS 93 07/01/2021 1523  ? BILITOT 0.9 07/01/2021 1523  ? GFRNONAA 56 (L) 12/13/2021 9629  ? GFRNONAA 79 03/10/2013 0810  ? GFRAA 58 (L) 10/05/2020 0950  ? GFRAA >89 03/10/2013 0810  ? ?Lipase  ?    ?Component Value Date/Time  ? LIPASE 69 (H) 05/13/2021 1102  ? ? ? ? ? ?Studies/Results: ?No results found. ? ?Anti-infectives: ?Anti-infectives (From admission, onward)  ? ? None  ? ?  ? ? ? ?Assessment/Plan ?GLF on Eliquis  ?  ?C6 vertebral body fx - Per NSGY, Dr. Kathyrn Sheriff. Cont collar till final recs ?R sup/inf pubic rami fx - Per Ortho, consult pending.  ?Pelvic Hematoma - CT pelvis w/ Pelvic hemoperitoneum with 8.3 x 5.6 x 3.2 cm anterior inferior pelvic hematoma extending down to the anterior pelvic floor bilaterally. Eliquis held, reversed with Kcentra. Hematoma stable on CT C/A/P 4/19. Hgb 7.4 this AM from 7.5 yest. Stable  ?R knee pain - film negative for fx, small joint effusion  ?Hx DM2 - SSI ?Hx CAD  ?Hx COPD - prn nebs. Home meds. On o2.  ?Hx CVA  ?Hx HTN - home meds. Hold PM dose Entresto  ?Hx HLD ?HX GERD - PPI ?Hx OSA - refuses CPAP - reports he does not wear at home ?Hx 2nd degree heart block s/p pacemaker ?PAF on Eliquis - Eliquis on hold. Will plan for our team to discuss w/ Cards prior to discharge if should continue Eliquis or discontinue at d/c given recent  fall ?Hx CHF - EF 35% on echo 2021. Holding lasix in setting of soft bp  ?Hx CKD - defer staging to PCP. Last Cr before admission 1.93 on labs 10/15/21. 1.25 today ?Incedential findings - Iliofemoral atherosclerosis with 2.1 cm stable aneurysmal prominence of the left common iliac artery. PCP f/u ? ?FEN - CM diet, add miralax today  ?VTE - SCDs, LMWH on hold. Thrombocytopenia - plts 63K ?Foley - TOV today ?ID - no current abx ? ?Plan - PT/OT, anticipate SNF ? LOS: 0 days  ? ?I reviewed CBC, vitals, meds. Reviewed therapy notes. Reviewed intake and output.  ? ? ?Norm Parcel, PA-C ?[facility] ?12/14/2021, 10:20 AM ?Please see Amion for pager number during day hours 7:00am-4:30pm ? ?

## 2021-12-15 DIAGNOSIS — S12590A Other displaced fracture of sixth cervical vertebra, initial encounter for closed fracture: Secondary | ICD-10-CM | POA: Diagnosis not present

## 2021-12-15 DIAGNOSIS — S32591A Other specified fracture of right pubis, initial encounter for closed fracture: Secondary | ICD-10-CM | POA: Diagnosis not present

## 2021-12-15 LAB — GLUCOSE, CAPILLARY
Glucose-Capillary: 104 mg/dL — ABNORMAL HIGH (ref 70–99)
Glucose-Capillary: 116 mg/dL — ABNORMAL HIGH (ref 70–99)
Glucose-Capillary: 128 mg/dL — ABNORMAL HIGH (ref 70–99)
Glucose-Capillary: 108 mg/dL — ABNORMAL HIGH (ref 70–99)
Glucose-Capillary: 107 mg/dL — ABNORMAL HIGH (ref 70–99)
Glucose-Capillary: 108 mg/dL — ABNORMAL HIGH (ref 70–99)

## 2021-12-15 LAB — CBC
HCT: 22.9 % — ABNORMAL LOW (ref 39.0–52.0)
Hemoglobin: 7.3 g/dL — ABNORMAL LOW (ref 13.0–17.0)
MCH: 31.3 pg (ref 26.0–34.0)
MCHC: 31.9 g/dL (ref 30.0–36.0)
MCV: 98.3 fL (ref 80.0–100.0)
Platelets: 75 10*3/uL — ABNORMAL LOW (ref 150–400)
RBC: 2.33 MIL/uL — ABNORMAL LOW (ref 4.22–5.81)
RDW: 16.1 % — ABNORMAL HIGH (ref 11.5–15.5)
WBC: 5.2 10*3/uL (ref 4.0–10.5)
nRBC: 0 % (ref 0.0–0.2)

## 2021-12-15 MED ORDER — METHOCARBAMOL 750 MG PO TABS
750.0000 mg | ORAL_TABLET | Freq: Four times a day (QID) | ORAL | Status: DC
  Administered 2021-12-15 – 2021-12-17 (×10): 750 mg via ORAL
  Filled 2021-12-15 (×10): qty 1

## 2021-12-15 NOTE — Progress Notes (Signed)
Patient refused CPAP.

## 2021-12-15 NOTE — Progress Notes (Signed)
? ?Progress Note ? ?   ?Subjective: ?Still no BM in several days. Tolerating diet. Alert and oriented this AM. Working on SNF placement. No family at bedside this AM.  ? ?Objective: ?Vital signs in last 24 hours: ?Temp:  [97.4 ?F (36.3 ?C)-98.8 ?F (37.1 ?C)] 98 ?F (36.7 ?C) (04/23 7026) ?Pulse Rate:  [63-93] 93 (04/23 0743) ?Resp:  [13-21] 16 (04/23 0743) ?BP: (103-116)/(50-79) 113/53 (04/23 0743) ?SpO2:  [93 %-100 %] 96 % (04/23 0857) ?Last BM Date : 12/10/21 ? ?Intake/Output from previous day: ?04/22 0701 - 04/23 0700 ?In: -  ?Out: 400 [Urine:400] ?Intake/Output this shift: ?No intake/output data recorded. ? ?PE: ?Gen: comfortable, no distress ?Neuro: non-focal exam ?HEENT: PERRL ?Neck: c-collar ?CV: RRR ?Pulm: unlabored breathing on RA ?Abd: soft, NT ?GU: spont voids ?Extr: wwp, no edema ? ? ?Lab Results:  ?Recent Labs  ?  12/14/21 ?0946 12/15/21 ?0316  ?WBC 5.6 5.2  ?HGB 7.4* 7.3*  ?HCT 23.1* 22.9*  ?PLT 63* 75*  ? ? ?BMET ?Recent Labs  ?  12/12/21 ?1025 12/13/21 ?0307  ?NA 139 138  ?K 4.5 4.3  ?CL 111 109  ?CO2 24 25  ?GLUCOSE 174* 122*  ?BUN 26* 24*  ?CREATININE 1.46* 1.25*  ?CALCIUM 9.0 9.1  ? ? ?PT/INR ?No results for input(s): LABPROT, INR in the last 72 hours. ?CMP  ?   ?Component Value Date/Time  ? NA 138 12/13/2021 0307  ? NA 142 10/15/2021 0927  ? K 4.3 12/13/2021 0307  ? CL 109 12/13/2021 0307  ? CO2 25 12/13/2021 0307  ? GLUCOSE 122 (H) 12/13/2021 3785  ? BUN 24 (H) 12/13/2021 8850  ? BUN 24 10/15/2021 0927  ? CREATININE 1.25 (H) 12/13/2021 2774  ? CREATININE 0.92 03/10/2013 0810  ? CALCIUM 9.1 12/13/2021 0307  ? PROT 6.1 07/01/2021 1523  ? ALBUMIN 3.8 10/15/2021 0927  ? AST 24 07/01/2021 1523  ? ALT 21 07/01/2021 1523  ? ALKPHOS 93 07/01/2021 1523  ? BILITOT 0.9 07/01/2021 1523  ? GFRNONAA 56 (L) 12/13/2021 1287  ? GFRNONAA 79 03/10/2013 0810  ? GFRAA 58 (L) 10/05/2020 0950  ? GFRAA >89 03/10/2013 0810  ? ?Lipase  ?   ?Component Value Date/Time  ? LIPASE 69 (H) 05/13/2021 1102   ? ? ? ? ? ?Studies/Results: ?No results found. ? ?Anti-infectives: ?Anti-infectives (From admission, onward)  ? ? None  ? ?  ? ? ? ?Assessment/Plan ?GLF on Eliquis  ?  ?C6 vertebral body fx - Per NSGY, Dr. Kathyrn Sheriff. Cont collar till final recs ?R sup/inf pubic rami fx - Per Ortho, consult pending.  ?Pelvic Hematoma - CT pelvis w/ Pelvic hemoperitoneum with 8.3 x 5.6 x 3.2 cm anterior inferior pelvic hematoma extending down to the anterior pelvic floor bilaterally. Eliquis held, reversed with Kcentra. Hematoma stable on CT C/A/P 4/19. Hgb stable at 7.3  ?R knee pain - film negative for fx, small joint effusion  ?Hx DM2 - SSI ?Hx CAD  ?Hx COPD - prn nebs. Home meds. On o2.  ?Hx CVA  ?Hx HTN - home meds. Hold PM dose Entresto  ?Hx HLD ?HX GERD - PPI ?Hx OSA - refuses CPAP - reports he does not wear at home ?Hx 2nd degree heart block s/p pacemaker ?PAF on Eliquis - Eliquis on hold. Will plan for our team to discuss w/ Cards prior to discharge if should continue Eliquis or discontinue at d/c given recent fall ?Hx CHF - EF 35% on echo 2021. Holding lasix in setting of  soft bp  ?Hx CKD - defer staging to PCP. Last Cr before admission 1.93 on labs 10/15/21. 1.25 4/21 ?Incedential findings - Iliofemoral atherosclerosis with 2.1 cm stable aneurysmal prominence of the left common iliac artery. PCP f/u ?Thrombocytopenia - plts up slightly to 75K, continue to monitor  ? ?FEN - CM diet, add miralax today  ?VTE - SCDs, LMWH on hold.  ?Foley - TOV today ?ID - no current abx ? ?Plan - PT/OT, anticipate SNF. Repeat AM labs ? LOS: 0 days  ? ?I reviewed CBC, vitals, meds. Reviewed therapy notes. Reviewed intake and output.  ? ? ?Norm Parcel, PA-C ?[facility] ?12/15/2021, 9:59 AM ?Please see Amion for pager number during day hours 7:00am-4:30pm ? ?
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
## 2021-12-16 ENCOUNTER — Telehealth: Payer: Medicare Other

## 2021-12-16 DIAGNOSIS — I480 Paroxysmal atrial fibrillation: Secondary | ICD-10-CM | POA: Diagnosis present

## 2021-12-16 DIAGNOSIS — Z800 Family history of malignant neoplasm of digestive organs: Secondary | ICD-10-CM | POA: Diagnosis not present

## 2021-12-16 DIAGNOSIS — Z801 Family history of malignant neoplasm of trachea, bronchus and lung: Secondary | ICD-10-CM | POA: Diagnosis not present

## 2021-12-16 DIAGNOSIS — Y92014 Private driveway to single-family (private) house as the place of occurrence of the external cause: Secondary | ICD-10-CM | POA: Diagnosis not present

## 2021-12-16 DIAGNOSIS — E785 Hyperlipidemia, unspecified: Secondary | ICD-10-CM | POA: Diagnosis not present

## 2021-12-16 DIAGNOSIS — S32501A Unspecified fracture of right pubis, initial encounter for closed fracture: Secondary | ICD-10-CM | POA: Diagnosis present

## 2021-12-16 DIAGNOSIS — E119 Type 2 diabetes mellitus without complications: Secondary | ICD-10-CM | POA: Diagnosis not present

## 2021-12-16 DIAGNOSIS — M6281 Muscle weakness (generalized): Secondary | ICD-10-CM | POA: Diagnosis not present

## 2021-12-16 DIAGNOSIS — I441 Atrioventricular block, second degree: Secondary | ICD-10-CM | POA: Diagnosis not present

## 2021-12-16 DIAGNOSIS — I10 Essential (primary) hypertension: Secondary | ICD-10-CM | POA: Diagnosis not present

## 2021-12-16 DIAGNOSIS — J449 Chronic obstructive pulmonary disease, unspecified: Secondary | ICD-10-CM | POA: Diagnosis not present

## 2021-12-16 DIAGNOSIS — Z743 Need for continuous supervision: Secondary | ICD-10-CM | POA: Diagnosis not present

## 2021-12-16 DIAGNOSIS — G4739 Other sleep apnea: Secondary | ICD-10-CM | POA: Diagnosis not present

## 2021-12-16 DIAGNOSIS — R269 Unspecified abnormalities of gait and mobility: Secondary | ICD-10-CM | POA: Diagnosis not present

## 2021-12-16 DIAGNOSIS — D696 Thrombocytopenia, unspecified: Secondary | ICD-10-CM | POA: Diagnosis present

## 2021-12-16 DIAGNOSIS — I5022 Chronic systolic (congestive) heart failure: Secondary | ICD-10-CM | POA: Diagnosis present

## 2021-12-16 DIAGNOSIS — R410 Disorientation, unspecified: Secondary | ICD-10-CM | POA: Diagnosis not present

## 2021-12-16 DIAGNOSIS — S32591A Other specified fracture of right pubis, initial encounter for closed fracture: Secondary | ICD-10-CM | POA: Diagnosis not present

## 2021-12-16 DIAGNOSIS — I2510 Atherosclerotic heart disease of native coronary artery without angina pectoris: Secondary | ICD-10-CM | POA: Diagnosis not present

## 2021-12-16 DIAGNOSIS — R278 Other lack of coordination: Secondary | ICD-10-CM | POA: Diagnosis not present

## 2021-12-16 DIAGNOSIS — T45515A Adverse effect of anticoagulants, initial encounter: Secondary | ICD-10-CM | POA: Diagnosis present

## 2021-12-16 DIAGNOSIS — E1122 Type 2 diabetes mellitus with diabetic chronic kidney disease: Secondary | ICD-10-CM | POA: Diagnosis present

## 2021-12-16 DIAGNOSIS — S12501A Unspecified nondisplaced fracture of sixth cervical vertebra, initial encounter for closed fracture: Secondary | ICD-10-CM | POA: Diagnosis present

## 2021-12-16 DIAGNOSIS — R279 Unspecified lack of coordination: Secondary | ICD-10-CM | POA: Diagnosis not present

## 2021-12-16 DIAGNOSIS — S12501D Unspecified nondisplaced fracture of sixth cervical vertebra, subsequent encounter for fracture with routine healing: Secondary | ICD-10-CM | POA: Diagnosis not present

## 2021-12-16 DIAGNOSIS — Z8673 Personal history of transient ischemic attack (TIA), and cerebral infarction without residual deficits: Secondary | ICD-10-CM | POA: Diagnosis not present

## 2021-12-16 DIAGNOSIS — D6832 Hemorrhagic disorder due to extrinsic circulating anticoagulants: Secondary | ICD-10-CM | POA: Diagnosis present

## 2021-12-16 DIAGNOSIS — W010XXA Fall on same level from slipping, tripping and stumbling without subsequent striking against object, initial encounter: Secondary | ICD-10-CM | POA: Diagnosis present

## 2021-12-16 DIAGNOSIS — Z8042 Family history of malignant neoplasm of prostate: Secondary | ICD-10-CM | POA: Diagnosis not present

## 2021-12-16 DIAGNOSIS — M25561 Pain in right knee: Secondary | ICD-10-CM | POA: Diagnosis not present

## 2021-12-16 DIAGNOSIS — K219 Gastro-esophageal reflux disease without esophagitis: Secondary | ICD-10-CM | POA: Diagnosis not present

## 2021-12-16 DIAGNOSIS — K661 Hemoperitoneum: Secondary | ICD-10-CM | POA: Diagnosis present

## 2021-12-16 DIAGNOSIS — S12590A Other displaced fracture of sixth cervical vertebra, initial encounter for closed fracture: Secondary | ICD-10-CM | POA: Diagnosis not present

## 2021-12-16 DIAGNOSIS — W19XXXA Unspecified fall, initial encounter: Secondary | ICD-10-CM | POA: Diagnosis not present

## 2021-12-16 DIAGNOSIS — I130 Hypertensive heart and chronic kidney disease with heart failure and stage 1 through stage 4 chronic kidney disease, or unspecified chronic kidney disease: Secondary | ICD-10-CM | POA: Diagnosis present

## 2021-12-16 DIAGNOSIS — D62 Acute posthemorrhagic anemia: Secondary | ICD-10-CM | POA: Diagnosis present

## 2021-12-16 DIAGNOSIS — T1490XD Injury, unspecified, subsequent encounter: Secondary | ICD-10-CM | POA: Diagnosis not present

## 2021-12-16 DIAGNOSIS — N189 Chronic kidney disease, unspecified: Secondary | ICD-10-CM | POA: Diagnosis present

## 2021-12-16 DIAGNOSIS — S32501D Unspecified fracture of right pubis, subsequent encounter for fracture with routine healing: Secondary | ICD-10-CM | POA: Diagnosis not present

## 2021-12-16 LAB — CBC
HCT: 24 % — ABNORMAL LOW (ref 39.0–52.0)
Hemoglobin: 7.8 g/dL — ABNORMAL LOW (ref 13.0–17.0)
MCH: 32.1 pg (ref 26.0–34.0)
MCHC: 32.5 g/dL (ref 30.0–36.0)
MCV: 98.8 fL (ref 80.0–100.0)
Platelets: 87 10*3/uL — ABNORMAL LOW (ref 150–400)
RBC: 2.43 MIL/uL — ABNORMAL LOW (ref 4.22–5.81)
RDW: 16.4 % — ABNORMAL HIGH (ref 11.5–15.5)
WBC: 5.5 10*3/uL (ref 4.0–10.5)
nRBC: 0 % (ref 0.0–0.2)

## 2021-12-16 LAB — GLUCOSE, CAPILLARY
Glucose-Capillary: 104 mg/dL — ABNORMAL HIGH (ref 70–99)
Glucose-Capillary: 106 mg/dL — ABNORMAL HIGH (ref 70–99)
Glucose-Capillary: 145 mg/dL — ABNORMAL HIGH (ref 70–99)
Glucose-Capillary: 167 mg/dL — ABNORMAL HIGH (ref 70–99)
Glucose-Capillary: 153 mg/dL — ABNORMAL HIGH (ref 70–99)
Glucose-Capillary: 109 mg/dL — ABNORMAL HIGH (ref 70–99)

## 2021-12-16 MED ORDER — POLYETHYLENE GLYCOL 3350 17 G PO PACK
17.0000 g | PACK | Freq: Two times a day (BID) | ORAL | Status: DC
  Administered 2021-12-16 – 2021-12-17 (×2): 17 g via ORAL
  Filled 2021-12-16 (×2): qty 1

## 2021-12-16 NOTE — TOC Progression Note (Addendum)
Transition of Care (TOC) - Progression Note  ? ? ?Patient Details  ?Name: [name] ?MRN: [id number] ?Date of Birth: [birth year] ? ?Transition of Care (TOC) CM/SW Contact  ?Ella Bodo, RN ?Phone Number: ?12/16/2021, 3:21 PM ? ?Clinical Narrative:    ?Pt medically stable for discharge to SNF.  He has been accepted by [facility], family's first choice for SNF.  Spoke with patient's daughter, [name], to confirm bed choice, and she states that they would like me to check with another facility in [place], [place].  Called Arby Barrette at [facility], per request, phone: [phone], and referral faxed to facility admissions dept.   ?Received call back from Manuela Schwartz in admissions at [facility]; she states that they cannot accept patient, as they currently are not accepting any new admissions.  Relayed this information to patient's daughter, and reviewed additional offers.  Daughter states she needs to speak with her sister, and she will get back to me.  Left list of accepting SNFs with ratings in patient's room, as daughter to visit this afternoon.  Left message with [facility] in [place], per daughter's request.  ?Reiterated to daughter that SNF bed choice needs to be decided today, so that we may start insurance authorization.  She verbalizes understanding of this.  Will follow up with daughter later today.  ? ?3299 Addendum:  ?Daughter has chosen [facility], and bed available on 12/17/21.   No covid testing needed, per [facility] in admissions.  I will submit for insurance authorization.  ? ? ?Expected Discharge Plan: [place] ?Barriers to Discharge: Continued Medical Work up, Ship broker ? ?Expected Discharge Plan and Services ?Expected Discharge Plan: [place] ?  ?Discharge Planning Services: CM Consult ?Post Acute Care Choice: [place] [place] ?Living arrangements for the past 2 months: [place] ?                ?  ?  ?  ?  ?  ?   ?  ?  ?  ?  ? ? ?Social Determinants of Health (SDOH) Interventions ?  ? ?Readmission Risk Interventions ?   ? View : No data to display.  ?  ?  ?  ? ?Reinaldo Raddle, RN, BSN  ?Trauma/Neuro ICU Case Manager ?[phone] ? ? ?

## 2021-12-16 NOTE — Progress Notes (Signed)
? ?Progress Note ? ?   ?Subjective: ?Alerted and oriented. Eating breakfast. Having some right sided pain with intermittent cough. Otherwise no complaints. Passing flatus. No BM ? ?Objective: ?Vital signs in last 24 hours: ?Temp:  [97.5 ?F (36.4 ?C)-98.8 ?F (37.1 ?C)] 97.9 ?F (36.6 ?C) (04/24 0344) ?Pulse Rate:  [66-110] 66 (04/24 0344) ?Resp:  [15-19] 19 (04/24 0344) ?BP: (102-117)/(49-68) 103/68 (04/24 0344) ?SpO2:  [91 %-96 %] 94 % (04/24 0344) ?Last BM Date : 12/10/21 ? ?Intake/Output from previous day: ?04/23 0701 - 04/24 0700 ?In: -  ?Out: 850 [Urine:850] ?Intake/Output this shift: ?No intake/output data recorded. ? ?PE: ?Gen: comfortable, no distress ?Neuro: non-focal exam ?HEENT: PERRL ?Neck: c-collar ?CV: RRR ?Pulm: unlabored breathing on RA ?Abd: soft, NT, soft healing ecchymosis right abdomen wall ?GU: spont voids ?Extr: wwp, no edema ? ? ?Lab Results:  ?Recent Labs  ?  12/14/21 ?0946 12/15/21 ?0316  ?WBC 5.6 5.2  ?HGB 7.4* 7.3*  ?HCT 23.1* 22.9*  ?PLT 63* 75*  ? ? ?BMET ?No results for input(s): NA, K, CL, CO2, GLUCOSE, BUN, CREATININE, CALCIUM in the last 72 hours. ? ?PT/INR ?No results for input(s): LABPROT, INR in the last 72 hours. ?CMP  ?   ?Component Value Date/Time  ? NA 138 12/13/2021 0307  ? NA 142 10/15/2021 0927  ? K 4.3 12/13/2021 0307  ? CL 109 12/13/2021 0307  ? CO2 25 12/13/2021 0307  ? GLUCOSE 122 (H) 12/13/2021 6195  ? BUN 24 (H) 12/13/2021 0932  ? BUN 24 10/15/2021 0927  ? CREATININE 1.25 (H) 12/13/2021 6712  ? CREATININE 0.92 03/10/2013 0810  ? CALCIUM 9.1 12/13/2021 0307  ? PROT 6.1 07/01/2021 1523  ? ALBUMIN 3.8 10/15/2021 0927  ? AST 24 07/01/2021 1523  ? ALT 21 07/01/2021 1523  ? ALKPHOS 93 07/01/2021 1523  ? BILITOT 0.9 07/01/2021 1523  ? GFRNONAA 56 (L) 12/13/2021 4580  ? GFRNONAA 79 03/10/2013 0810  ? GFRAA 58 (L) 10/05/2020 0950  ? GFRAA >89 03/10/2013 0810  ? ?Lipase  ?   ?Component Value Date/Time  ? LIPASE 69 (H) 05/13/2021 1102  ? ? ? ? ? ?Studies/Results: ?No results  found. ? ?Anti-infectives: ?Anti-infectives (From admission, onward)  ? ? None  ? ?  ? ? ? ?Assessment/Plan ?GLF on Eliquis  ?  ?C6 vertebral body fx - Per NSGY, Dr. Kathyrn Sheriff. Cont rigid collar - outpt f/u in 4 weeks ?Likely concussion - TBI team therapies ?R sup/inf pubic rami fx - Per Ortho - non op with WBAT BLE. F/u Dr. Doreatha Martin in 3 weeks  ?Pelvic Hematoma - CT pelvis w/ Pelvic hemoperitoneum with 8.3 x 5.6 x 3.2 cm anterior inferior pelvic hematoma extending down to the anterior pelvic floor bilaterally. Eliquis held, reversed with Kcentra. Hematoma stable on CT C/A/P 4/19. Hgb stable at 7.3. recheck today pending ?R knee pain - film negative for fx, small joint effusion  ?Hx DM2 - SSI ?Hx CAD  ?Hx COPD - prn nebs. Home meds. On room air currently  ?Hx CVA  ?Hx HTN - home meds. Hold PM dose Entresto  ?Hx HLD ?HX GERD - PPI ?Hx OSA - refuses CPAP - reports he does not wear at home ?Hx 2nd degree heart block s/p pacemaker ?PAF on Eliquis - Eliquis on hold. Will plan for our team to discuss w/ Cards prior to discharge if should continue Eliquis or discontinue at d/c given recent fall ?Hx CHF - EF 35% on echo 2021. Holding lasix in setting of  soft bp  ?Hx CKD - defer staging to PCP. Last Cr before admission 1.93 on labs 10/15/21. 1.25 4/21 ?Incedential findings - Iliofemoral atherosclerosis with 2.1 cm stable aneurysmal prominence of the left common iliac artery. PCP f/u ?Thrombocytopenia - plts up slightly to 75K 4/23, continue to monitor. Labs pending  ? ?FEN - CM diet, miralax inc to bid ?VTE - SCDs, LMWH on hold.  ?Foley - TOV today ?ID - no current abx ? ?Plan - PT/OT/SLP, anticipate SNF. Repeat AM labs ? ? LOS: 0 days  ? ?I reviewed CBC, vitals, meds. Reviewed therapy notes. Reviewed intake and output.  ? ? ?Winferd Humphrey, PA-C ?[facility] ?12/16/2021, 8:49 AM ?Please see Amion for pager number during day hours 7:00am-4:30pm ? ?

## 2021-12-16 NOTE — Progress Notes (Signed)
PT Cancellation Note ? ?Patient Details ?Name: [name] ?MRN: [id number] ?DOB: 1933/01/28 ? ? ?Cancelled Treatment:    Reason Eval/Treat Not Completed: Other (comment) ?Pt up twice with nursing.  At arrival, pt soiled and nursing in changing/cleaning pt and pt constantly moaning.  Not able to participate with therapy at this time due to pain. ?Abran Richard, PT ?Acute Rehab Services ?Pager [phone] ?[facility] [phone] ? ? ?[name] ?12/16/2021, 3:13 PM ?

## 2021-12-17 DIAGNOSIS — Z951 Presence of aortocoronary bypass graft: Secondary | ICD-10-CM | POA: Diagnosis not present

## 2021-12-17 DIAGNOSIS — S329XXD Fracture of unspecified parts of lumbosacral spine and pelvis, subsequent encounter for fracture with routine healing: Secondary | ICD-10-CM | POA: Diagnosis not present

## 2021-12-17 DIAGNOSIS — Z950 Presence of cardiac pacemaker: Secondary | ICD-10-CM | POA: Diagnosis not present

## 2021-12-17 DIAGNOSIS — G9341 Metabolic encephalopathy: Secondary | ICD-10-CM | POA: Diagnosis not present

## 2021-12-17 DIAGNOSIS — R300 Dysuria: Secondary | ICD-10-CM | POA: Diagnosis not present

## 2021-12-17 DIAGNOSIS — S32519D Fracture of superior rim of unspecified pubis, subsequent encounter for fracture with routine healing: Secondary | ICD-10-CM | POA: Diagnosis not present

## 2021-12-17 DIAGNOSIS — I10 Essential (primary) hypertension: Secondary | ICD-10-CM | POA: Diagnosis not present

## 2021-12-17 DIAGNOSIS — R278 Other lack of coordination: Secondary | ICD-10-CM | POA: Diagnosis not present

## 2021-12-17 DIAGNOSIS — E1165 Type 2 diabetes mellitus with hyperglycemia: Secondary | ICD-10-CM | POA: Diagnosis not present

## 2021-12-17 DIAGNOSIS — I2510 Atherosclerotic heart disease of native coronary artery without angina pectoris: Secondary | ICD-10-CM | POA: Diagnosis not present

## 2021-12-17 DIAGNOSIS — G4733 Obstructive sleep apnea (adult) (pediatric): Secondary | ICD-10-CM | POA: Diagnosis not present

## 2021-12-17 DIAGNOSIS — J449 Chronic obstructive pulmonary disease, unspecified: Secondary | ICD-10-CM | POA: Diagnosis not present

## 2021-12-17 DIAGNOSIS — R1310 Dysphagia, unspecified: Secondary | ICD-10-CM | POA: Diagnosis not present

## 2021-12-17 DIAGNOSIS — R279 Unspecified lack of coordination: Secondary | ICD-10-CM | POA: Diagnosis not present

## 2021-12-17 DIAGNOSIS — K219 Gastro-esophageal reflux disease without esophagitis: Secondary | ICD-10-CM | POA: Diagnosis not present

## 2021-12-17 DIAGNOSIS — G4739 Other sleep apnea: Secondary | ICD-10-CM | POA: Diagnosis not present

## 2021-12-17 DIAGNOSIS — I480 Paroxysmal atrial fibrillation: Secondary | ICD-10-CM | POA: Diagnosis not present

## 2021-12-17 DIAGNOSIS — S12501D Unspecified nondisplaced fracture of sixth cervical vertebra, subsequent encounter for fracture with routine healing: Secondary | ICD-10-CM | POA: Diagnosis not present

## 2021-12-17 DIAGNOSIS — F32A Depression, unspecified: Secondary | ICD-10-CM | POA: Diagnosis not present

## 2021-12-17 DIAGNOSIS — S32009D Unspecified fracture of unspecified lumbar vertebra, subsequent encounter for fracture with routine healing: Secondary | ICD-10-CM | POA: Diagnosis not present

## 2021-12-17 DIAGNOSIS — Z87440 Personal history of urinary (tract) infections: Secondary | ICD-10-CM | POA: Diagnosis not present

## 2021-12-17 DIAGNOSIS — E119 Type 2 diabetes mellitus without complications: Secondary | ICD-10-CM | POA: Diagnosis not present

## 2021-12-17 DIAGNOSIS — S32501D Unspecified fracture of right pubis, subsequent encounter for fracture with routine healing: Secondary | ICD-10-CM | POA: Diagnosis not present

## 2021-12-17 DIAGNOSIS — M25561 Pain in right knee: Secondary | ICD-10-CM | POA: Diagnosis not present

## 2021-12-17 DIAGNOSIS — W19XXXS Unspecified fall, sequela: Secondary | ICD-10-CM | POA: Diagnosis present

## 2021-12-17 DIAGNOSIS — R410 Disorientation, unspecified: Secondary | ICD-10-CM | POA: Diagnosis not present

## 2021-12-17 DIAGNOSIS — N1831 Chronic kidney disease, stage 3a: Secondary | ICD-10-CM | POA: Diagnosis not present

## 2021-12-17 DIAGNOSIS — T1490XD Injury, unspecified, subsequent encounter: Secondary | ICD-10-CM | POA: Diagnosis not present

## 2021-12-17 DIAGNOSIS — S31829A Unspecified open wound of left buttock, initial encounter: Secondary | ICD-10-CM | POA: Diagnosis not present

## 2021-12-17 DIAGNOSIS — I5022 Chronic systolic (congestive) heart failure: Secondary | ICD-10-CM | POA: Diagnosis not present

## 2021-12-17 DIAGNOSIS — E43 Unspecified severe protein-calorie malnutrition: Secondary | ICD-10-CM | POA: Diagnosis not present

## 2021-12-17 DIAGNOSIS — I6789 Other cerebrovascular disease: Secondary | ICD-10-CM | POA: Diagnosis not present

## 2021-12-17 DIAGNOSIS — I495 Sick sinus syndrome: Secondary | ICD-10-CM | POA: Diagnosis not present

## 2021-12-17 DIAGNOSIS — R531 Weakness: Secondary | ICD-10-CM | POA: Diagnosis not present

## 2021-12-17 DIAGNOSIS — I130 Hypertensive heart and chronic kidney disease with heart failure and stage 1 through stage 4 chronic kidney disease, or unspecified chronic kidney disease: Secondary | ICD-10-CM | POA: Diagnosis not present

## 2021-12-17 DIAGNOSIS — N1832 Chronic kidney disease, stage 3b: Secondary | ICD-10-CM | POA: Diagnosis not present

## 2021-12-17 DIAGNOSIS — F02B11 Dementia in other diseases classified elsewhere, moderate, with agitation: Secondary | ICD-10-CM | POA: Diagnosis not present

## 2021-12-17 DIAGNOSIS — S12590A Other displaced fracture of sixth cervical vertebra, initial encounter for closed fracture: Secondary | ICD-10-CM | POA: Diagnosis not present

## 2021-12-17 DIAGNOSIS — Z515 Encounter for palliative care: Secondary | ICD-10-CM | POA: Diagnosis not present

## 2021-12-17 DIAGNOSIS — E785 Hyperlipidemia, unspecified: Secondary | ICD-10-CM | POA: Diagnosis not present

## 2021-12-17 DIAGNOSIS — I6381 Other cerebral infarction due to occlusion or stenosis of small artery: Secondary | ICD-10-CM | POA: Diagnosis not present

## 2021-12-17 DIAGNOSIS — S32591A Other specified fracture of right pubis, initial encounter for closed fracture: Secondary | ICD-10-CM | POA: Diagnosis not present

## 2021-12-17 DIAGNOSIS — W19XXXA Unspecified fall, initial encounter: Secondary | ICD-10-CM | POA: Diagnosis not present

## 2021-12-17 DIAGNOSIS — R4182 Altered mental status, unspecified: Secondary | ICD-10-CM | POA: Diagnosis present

## 2021-12-17 DIAGNOSIS — D696 Thrombocytopenia, unspecified: Secondary | ICD-10-CM | POA: Diagnosis not present

## 2021-12-17 DIAGNOSIS — M6281 Muscle weakness (generalized): Secondary | ICD-10-CM | POA: Diagnosis not present

## 2021-12-17 DIAGNOSIS — I441 Atrioventricular block, second degree: Secondary | ICD-10-CM | POA: Diagnosis not present

## 2021-12-17 DIAGNOSIS — R638 Other symptoms and signs concerning food and fluid intake: Secondary | ICD-10-CM | POA: Diagnosis not present

## 2021-12-17 DIAGNOSIS — Z8673 Personal history of transient ischemic attack (TIA), and cerebral infarction without residual deficits: Secondary | ICD-10-CM | POA: Diagnosis not present

## 2021-12-17 DIAGNOSIS — Z743 Need for continuous supervision: Secondary | ICD-10-CM | POA: Diagnosis not present

## 2021-12-17 DIAGNOSIS — E1122 Type 2 diabetes mellitus with diabetic chronic kidney disease: Secondary | ICD-10-CM | POA: Diagnosis not present

## 2021-12-17 DIAGNOSIS — N179 Acute kidney failure, unspecified: Secondary | ICD-10-CM | POA: Diagnosis not present

## 2021-12-17 DIAGNOSIS — G309 Alzheimer's disease, unspecified: Secondary | ICD-10-CM | POA: Diagnosis not present

## 2021-12-17 DIAGNOSIS — I951 Orthostatic hypotension: Secondary | ICD-10-CM | POA: Diagnosis not present

## 2021-12-17 DIAGNOSIS — G9340 Encephalopathy, unspecified: Secondary | ICD-10-CM | POA: Diagnosis not present

## 2021-12-17 DIAGNOSIS — I5040 Unspecified combined systolic (congestive) and diastolic (congestive) heart failure: Secondary | ICD-10-CM | POA: Diagnosis not present

## 2021-12-17 DIAGNOSIS — R339 Retention of urine, unspecified: Secondary | ICD-10-CM | POA: Diagnosis not present

## 2021-12-17 DIAGNOSIS — R269 Unspecified abnormalities of gait and mobility: Secondary | ICD-10-CM | POA: Diagnosis not present

## 2021-12-17 DIAGNOSIS — K5904 Chronic idiopathic constipation: Secondary | ICD-10-CM | POA: Diagnosis not present

## 2021-12-17 DIAGNOSIS — Z6823 Body mass index (BMI) 23.0-23.9, adult: Secondary | ICD-10-CM | POA: Diagnosis not present

## 2021-12-17 DIAGNOSIS — S37892S Contusion of other urinary and pelvic organ, sequela: Secondary | ICD-10-CM | POA: Diagnosis not present

## 2021-12-17 LAB — CBC
HCT: 24 % — ABNORMAL LOW (ref 39.0–52.0)
Hemoglobin: 7.7 g/dL — ABNORMAL LOW (ref 13.0–17.0)
MCH: 31.7 pg (ref 26.0–34.0)
MCHC: 32.1 g/dL (ref 30.0–36.0)
MCV: 98.8 fL (ref 80.0–100.0)
Platelets: 91 10*3/uL — ABNORMAL LOW (ref 150–400)
RBC: 2.43 MIL/uL — ABNORMAL LOW (ref 4.22–5.81)
RDW: 16.5 % — ABNORMAL HIGH (ref 11.5–15.5)
WBC: 5.3 10*3/uL (ref 4.0–10.5)
nRBC: 0 % (ref 0.0–0.2)

## 2021-12-17 LAB — GLUCOSE, CAPILLARY
Glucose-Capillary: 114 mg/dL — ABNORMAL HIGH (ref 70–99)
Glucose-Capillary: 121 mg/dL — ABNORMAL HIGH (ref 70–99)
Glucose-Capillary: 140 mg/dL — ABNORMAL HIGH (ref 70–99)

## 2021-12-17 MED ORDER — BISACODYL 10 MG RE SUPP: 10.0000 mg | Freq: Every day | RECTAL | 0 refills | Status: DC | PRN

## 2021-12-17 MED ORDER — OXYCODONE HCL 5 MG PO TABS: 5.0000 mg | ORAL_TABLET | Freq: Four times a day (QID) | ORAL | 0 refills | Status: AC | PRN

## 2021-12-17 MED ORDER — SPIRONOLACTONE 25 MG PO TABS: 12.5000 mg | ORAL_TABLET | Freq: Every day | ORAL | 3 refills | Status: DC

## 2021-12-17 MED ORDER — FUROSEMIDE 20 MG PO TABS: 20.0000 mg | ORAL_TABLET | ORAL | 3 refills | Status: DC

## 2021-12-17 MED ORDER — SACUBITRIL-VALSARTAN 24-26 MG PO TABS: 1.0000 | ORAL_TABLET | Freq: Two times a day (BID) | ORAL | 3 refills | Status: DC

## 2021-12-17 MED ORDER — METOPROLOL SUCCINATE ER 25 MG PO TB24: 12.5000 mg | ORAL_TABLET | Freq: Every day | ORAL | 3 refills | Status: DC

## 2021-12-17 MED ORDER — DOCUSATE SODIUM 100 MG PO CAPS: 100.0000 mg | ORAL_CAPSULE | Freq: Two times a day (BID) | ORAL | 0 refills | Status: DC

## 2021-12-17 MED ORDER — APIXABAN 5 MG PO TABS
5.0000 mg | ORAL_TABLET | Freq: Two times a day (BID) | ORAL | Status: DC
Start: 2021-12-17 — End: 2022-01-21

## 2021-12-17 MED ORDER — METHOCARBAMOL 500 MG PO TABS: 500.0000 mg | ORAL_TABLET | Freq: Four times a day (QID) | ORAL | 0 refills | Status: AC | PRN

## 2021-12-17 MED ORDER — POLYETHYLENE GLYCOL 3350 17 G PO PACK: 17.0000 g | PACK | Freq: Every day | ORAL | 0 refills | Status: AC | PRN
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
## 2021-12-17 NOTE — TOC Transition Note (Signed)
Transition of Care (TOC) - CM/SW Discharge Note ? ? ?Patient Details  ?Name: [name] ?MRN: [id number] ?Date of Birth: 15-Jan-1933 ? ?Transition of Care [facility]) CM/SW Contact:  ?Ella Bodo, RN ?Phone Number: ?12/17/2021, 12:40 PM ? ? ?Clinical Narrative:    ?Insurance authorization has been received for SNF admission.  Patient medically stable for discharge to [facility] today; discharge summary faxed to facility, attn Horris Latino.  Bedside nurse to call report to [phone].  Called for [facility] tranport at 12:46; patient and daughter [name] has been notified of patient discharge today.   ? ? ?Final next level of care: [place] ?Barriers to Discharge: Barriers Resolved ? ? ?Patient Goals and CMS Choice ?  ?CMS Medicare.gov Compare Post Acute Care list provided to:: Patient Represenative (must comment) (wife) ?Choice offered to / list presented to : Spouse ? ?Discharge Placement ?PASRR number recieved: 12/13/21 ?           ?Patient chooses bed at: [facility] ?Patient to be transferred to facility by: [facility] ?Name of family member notified: daughter, [name] ?Patient and family notified of of transfer: 12/17/21 ? ?Discharge Plan and Services ?  ?Discharge Planning Services: CM Consult ?Post Acute Care Choice: [place]          ?  ?  ?  ?  ?  ?  ?  ?  ?  ?  ? ?Social Determinants of Health (SDOH) Interventions ?  ? ? ?Readmission Risk Interventions ?   ? View : No data to display.  ?  ?  ?  ? ?Reinaldo Raddle, RN, BSN  ?Trauma/Neuro ICU Case Manager ?[phone] ? ? ? ? ?

## 2021-12-17 NOTE — Discharge Instructions (Signed)
Do not resume eliquis, entresto, metoprolol, lasix, spironolactone until recommended by cardiology.  ?

## 2021-12-17 NOTE — Progress Notes (Signed)
Patients daughter wanted me to get him to call her but when I went into room patient was asleep. Will try later.  ?

## 2021-12-17 NOTE — Progress Notes (Signed)
Physical Therapy Treatment ?Patient Details ?Name: [name] ?MRN: [id number] ?DOB: 07-Feb-1933 ?Today's Date: 12/17/2021 ? ? ?History of Present Illness Pt is an 88 year old male presented as a level 2 trauma after sustaining a ground-level fall.  Pt found to have C6 fx and pubic rami fx with plans for nonoperative mgmt. PMH: CAD, COPD, CVA, GERD, HTN, hx of CABG, PSA, a fib, hx of prostate cancer, DM, vertigo. ? ?  ?PT Comments  ? ? Patient with limited progress due to pain and attempts to off load R hip in sitting and standing with significant L lateral lean.  Patient able to stand several times and progressed with balance during session, but still limited despite pre-medication.  Patient appropriate for SNF level rehab.  Will follow acutely until d/c.   ?Recommendations for follow up therapy are one component of a multi-disciplinary discharge planning process, led by the attending physician.  Recommendations may be updated based on patient status, additional functional criteria and insurance authorization. ? ?Follow Up Recommendations ? Skilled nursing-short term rehab (<3 hours/day) ?  ?  ?Assistance Recommended at Discharge Frequent or constant Supervision/Assistance  ?Patient can return home with the following Two people to help with walking and/or transfers;Two people to help with bathing/dressing/bathroom;Assistance with cooking/housework;Assist for transportation;Help with stairs or ramp for entrance;Direct supervision/assist for medications management;Direct supervision/assist for financial management ?  ?Equipment Recommendations ? None recommended by PT  ?  ?Recommendations for Other Services   ? ? ?  ?Precautions / Restrictions Precautions ?Precautions: Fall;Cervical ?Required Braces or Orthoses: Cervical Brace ?Cervical Brace: Hard collar;At all times ?Restrictions ?RLE Weight Bearing: Weight bearing as tolerated ?LLE Weight Bearing: Weight bearing as tolerated  ?  ? ?Mobility ? Bed Mobility ?Overal  bed mobility: Needs Assistance ?Bed Mobility: Supine to Sit, Sit to Supine ?  ?  ?Supine to sit: Mod assist, HOB elevated ?Sit to supine: Max assist, +2 for physical assistance ?  ?General bed mobility comments: used rail and elevated HOB and HHA to lift trunk, PT assist to move legs off the bed; to supine assist for legs and trunk with +2 A. ?  ? ?Transfers ?Overall transfer level: Needs assistance ?Equipment used: Rolling walker (2 wheels) ?Transfers: Sit to/from Stand ?Sit to Stand: Mod assist, From elevated surface, Max assist, +2 safety/equipment ?  ?  ?  ?  ?  ?General transfer comment: stood x 4 from EOB, initially leaning hard to L and max A +1, then mod A for 2 subsequent stands after woking on balance in sitting, then x 2 with +2 for safety to side step toward [facility] and assist with perineal hygiene due to having BM ?  ? ?Ambulation/Gait ?Ambulation/Gait assistance: +2 physical assistance, Mod assist ?Gait Distance (Feet): 2 Feet ?Assistive device: Rolling walker (2 wheels) ?Gait Pattern/deviations: Step-to pattern ?  ?  ?  ?General Gait Details: side steps x 2 toward [facility] with assist to move R LE; progressively more foward flexed and L lateral lean. ? ? ?Stairs ?  ?  ?  ?  ?  ? ? ?Wheelchair Mobility ?  ? ?Modified Rankin (Stroke Patients Only) ?  ? ? ?  ?Balance Overall balance assessment: Needs assistance ?Sitting-balance support: Feet supported ?Sitting balance-Leahy Scale: Poor ?Sitting balance - Comments: initially leaning hard L to off load R hip, able to sit with close S and UE support, progressively leaning forward and to L, however, not able to sit without at least close S today ?Postural control: Left lateral lean, Posterior  lean ?Standing balance support: Bilateral upper extremity supported ?Standing balance-Leahy Scale: Poor ?Standing balance comment: initially mod to max A standing with RW due to L lateral lean, progressed to min to mod A ?  ?  ?  ?  ?  ?  ?  ?  ?  ?  ?  ?  ? ?  ?Cognition  Arousal/Alertness: Awake/alert ?Behavior During Therapy: [facility] for tasks assessed/performed ?Overall Cognitive Status: Impaired/Different from baseline ?Area of Impairment: Problem solving, Awareness, Safety/judgement, Following commands, Memory, Attention, Orientation ?  ?  ?  ?  ?  ?  ?  ?  ?Orientation Level: Disoriented to, Time ?Current Attention Level: Sustained ?Memory: Decreased short-term memory ?Following Commands: Follows one step commands with increased time ?Safety/Judgement: Decreased awareness of safety ?  ?Problem Solving: Slow processing, Decreased initiation, Requires verbal cues, Difficulty sequencing ?  ?  ?  ? ?  ?Exercises   ? ?  ?General Comments   ?  ?  ? ?Pertinent Vitals/Pain Pain Assessment ?Faces Pain Scale: Hurts whole lot ?Pain Location: R hip with movement ?Pain Descriptors / Indicators: Grimacing, Guarding, Moaning, Sharp ?Pain Intervention(s): Monitored during session, Repositioned, Limited activity within patient's tolerance, Premedicated before session  ? ? ?Home Living   ?  ?  ?  ?  ?  ?  ?  ?  ?  ?   ?  ?Prior Function    ?  ?  ?   ? ?PT Goals (current goals can now be found in the care plan section) Progress towards PT goals: Not progressing toward goals - comment ? ?  ?Frequency ? ? ? Min 3X/week ? ? ? ?  ?PT Plan Current plan remains appropriate  ? ? ?Co-evaluation   ?  ?  ?  ?  ? ?  ?AM-PAC PT "6 Clicks" Mobility   ?Outcome Measure ? Help needed turning from your back to your side while in a flat bed without using bedrails?: Total ?Help needed moving from lying on your back to sitting on the side of a flat bed without using bedrails?: Total ?Help needed moving to and from a bed to a chair (including a wheelchair)?: Total ?Help needed standing up from a chair using your arms (e.g., wheelchair or bedside chair)?: Total ?Help needed to walk in hospital room?: Total ?Help needed climbing 3-5 steps with a railing? : Total ?6 Click Score: 6 ? ?  ?End of Session Equipment Utilized  During Treatment: Gait belt;Cervical collar ?Activity Tolerance: Patient limited by pain ?Patient left: in bed;with call bell/phone within reach;with bed alarm set ?  ?PT Visit Diagnosis: Pain;History of falling (Z91.81);Muscle weakness (generalized) (M62.81);Other abnormalities of gait and mobility (R26.89) ?Pain - Right/Left: Right ?Pain - part of body: Hip ?  ? ? ?Time: 8921-1941 ?PT Time Calculation (min) (ACUTE ONLY): 32 min ? ?Charges:  $Therapeutic Activity: 23-37 mins          ?          ? ?Magda Kiel, PT ?Acute Rehabilitation Services ?DEYCX:[phone] ?Office:[phone] ?12/17/2021 ? ? ? ?Reginia Naas ?12/17/2021, 1:54 PM ? ?

## 2021-12-17 NOTE — Discharge Summary (Signed)
Physician Discharge Summary  ?Patient ID: ?[name] ?MRN: [id number] ?DOB/AGE: 86-29-1934 86 y.o. ? ?Admit date: 12/10/2021 ?Discharge date: 12/17/2021 ? ?Admission Diagnoses ?Trauma [T14.90XA] ?Closed fracture of right pubis, unspecified portion of pubis, initial encounter ([place]) [S32.501A] ?Closed nondisplaced fracture of sixth cervical vertebra, unspecified fracture morphology, initial encounter ([place]) [S12.501A] ? ?Discharge Diagnoses ?Trauma ?C6 vertebral body fracture  ?Concussion ?Right superior/inferior pubic rami fracture  ?Pelvic Hematoma   ?Right knee pain   ?DM2 ?Coronary artery disease ?COPD  ?History of CVA  ?Hypertension   ?Hyperlipidemia ?GERD  ?OSA  ?History of 2nd degree heart block s/p pacemaker ?Paroxysmal atrial fibrillation on Eliquis  ?Congestive heart failure ?CKD  ?Incedential findings ?Thrombocytopenia  ? ?Consultants ?Orthopedic surgery - Dr. Doreatha Martin ?Neurosurgery - Dr. Kathyrn Sheriff ? ?Procedures ?none ? ?HPI: 88 year old gentleman who is on Eliquis with multiple medical problems as listed below who presented as a level 2 trauma after sustaining a ground-level fall.  He was trying to blow leaves off of his porch, with the assistance of his walking stick whereas he normally uses a walker, when he lost his balance and tripped over this, landing on his right side.  He reports pain in the right hip when he lifts his leg off the bed and is requesting his c-collar be removed.  Denies any headache, double vision, chest pain, shortness of breath, or abdominal pain. ? ?Hospital Course:  ?C6 vertebral body fracture- Evaluated by neurosurgery Dr. Kathyrn Sheriff. He will continue rigid collar with outpatient follow up in 4 weeks ?Likely concussion - he worked with TBI team therapies during admission ?Right superior/inferior pubic rami fracture - Orthopedic surgery Dr. Doreatha Martin consulted and recommended non operative management with weightbearing at tolerated bilateral lower extremities. Follow up in 3 weeks   ?Pelvic Hematoma - CT pelvis w/ Pelvic hemoperitoneum with 8.3 x 5.6 x 3.2 cm anterior inferior pelvic hematoma extending down to the anterior pelvic floor bilaterally. Eliquis held, reversed with Kcentra. Hematoma remained stable on CT C/A/P 12/11/21. Hemoglobin was monitored during admission and remained stable on date of discharge.  ?Right knee pain - xray was negative for fracture but did show small joint effusion. He worked with therapies during admission.  ?DM2 - he remained on SSI during admission ?Coronary artery disease - stable during admission ?COPD - as need nebulizer treatments were available during admission and he remained on his home medications. He needed supplemental oxygen intermittently during admission.  ?History of CVA - stable during admission ?Hypertension - initially managed with home medications during admission. Blood pressure remained soft and therefore home medications held during admission/at discharge and he will need to follow up with cardiology outpatient for continued management and updated recommendations. ?Hyperlipidemia - stable during admission ?GERD - managed with proton pump inhibitor during admission ?OSA - Respiratory therapy evaluated patient during admission and CPAP recommended which he refused as he stated he does not wear one at home ?History of 2nd degree heart block s/p pacemaker - stable during admission ?Paroxysmal atrial fibrillation on Eliquis - due to ABLA/pelvic hematoma his eliquis was reversed on admission and then held. Will continue to hold at discharge and recommend close cardiology follow up for medication management ?Congestive heart failure- EF 35% on echo 2021. Lasix was held in setting of soft blood pressure and will be held on discharge. Recommend close follow up with cardiology.  ?CKD - Last creatinine before admission was 1.93 on labs 10/15/21. 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 No acute intervention during admission and recommend outpatient follow up with PCP ?Thrombocytopenia - platelet count steadily improved during admission. ? ?On date of discharge patient had appropriately progressed with therapies and met criteria for safe discharge to skilled nursing facility with the support of family. ? ?I discussed discharge instructions with patient as well as return precautions and all questions and concerns were addressed.  ? ?I or a member of my team have reviewed this patient in the Controlled Substance Database. ? ?Patient agrees to follow up as below. ? ?PE: ?Gen: comfortable, no distress ?Neuro: non-focal exam ?HEENT: PERRL ?Neck: c-collar ?CV: RRR ?Pulm: unlabored breathing on RA ?Abd: soft, NT, soft healing ecchymosis right abdominal wall ?GU: spont voids ?Extr: wwp, no edema ? ?Allergies as of 12/17/2021   ? ?   Reactions  ? Penicillins Other (See Comments)  ? Unknown  ? Tramadol Other (See Comments)  ? Dizzy  ? Ketorolac Tromethamine Rash  ? ?  ? ?  ?Medication List  ?  ? ?STOP taking these medications   ? ?HYDROcodone-acetaminophen 5-325 MG tablet ?Commonly known as: NORCO/VICODIN ?  ? ?  ? ?TAKE these medications   ? ?acetaminophen 500 MG tablet ?Commonly known as: TYLENOL ?Take 500-1,000 mg by mouth every 6 (six) hours as needed for moderate pain. ?  ?albuterol 108 (90 Base) MCG/ACT inhaler ?Commonly known as: VENTOLIN HFA ?Inhale 2 puffs into the lungs every 6 (six) hours as needed for wheezing or shortness of breath. ?  ?amiodarone 200 MG tablet ?Commonly known as: PACERONE ?TAKE 1 TABLET BY MOUTH  DAILY ?What changed: when to take this ?  ?apixaban 5 MG Tabs tablet ?Commonly known as: Eliquis ?Take 1 tablet (5 mg total) by mouth 2 (two) times daily. Do not resume until cardiology follow up ?What changed: additional instructions ?  ?azelastine 0.1 % nasal spray ?Commonly known as: ASTELIN ?Place 1 spray into  both nostrils 2 (two) times daily. ?  ?bisacodyl 10 MG suppository ?Commonly known as: DULCOLAX ?Place 1 suppository (10 mg total) rectally daily as needed for moderate constipation. ?  ?budesonide-formoterol 160-4.5 MCG/ACT inhaler ?Commonly known as: SYMBICORT ?Inhale 2 puffs into the lungs 2 (two) times daily. ?  ?desvenlafaxine 25 MG 24 hr tablet ?Commonly known as: PRISTIQ ?Take 1 tablet (25 mg total) by mouth daily. ?What changed: when to take this ?  ?docusate sodium 100 MG capsule ?Commonly known as: COLACE ?Take 1 capsule (100 mg total) by mouth 2 (two) times daily. ?  ?fluticasone 50 MCG/ACT nasal spray ?Commonly known as: FLONASE ?Place 2 sprays into both nostrils daily as needed for allergies or rhinitis. ?  ?furosemide 20 MG tablet ?Commonly known as: LASIX ?Take 1 tablet (20 mg total) by mouth every other day. Do not resume until cardiology follow up ?What changed: additional instructions ?  ?ketoconazole 2 % cream ?Commonly known as: NIZORAL ?Apply topically daily. ?  ?meclizine 25 MG tablet ?Commonly known as: ANTIVERT ?Take 1 tablet (25 mg total) by mouth 3 (three) times daily as needed for dizziness. ?  ?metFORMIN 500 MG tablet ?Commonly known as: GLUCOPHAGE ?Take 1 tablet (500 mg total) by mouth daily with breakfast. ?  ?methocarbamol 500 MG tablet ?Commonly known as: ROBAXIN ?Take 1 tablet (500 mg total) by mouth every 6 (six) hours as needed for up to 5 days for muscle spasms. ?  ?metoprolol succinate 25 MG 24 hr tablet ?Commonly known as: TOPROL-XL ?Take 0.5  tablets (12.5 mg total) by mouth daily. Do not resume until cardiology follow up ?What changed: additional instructions ?  ?omeprazole 20 MG capsule ?Commonly known as: PRILOSEC ?TAKE 1 CAPSULE BY MOUTH  DAILY ?  ?onetouch ultrasoft lancets ?Use to check blood sugars daily ?  ?OneTouch Verio test strip ?Generic drug: glucose blood ?TEST BLOOD SUGARS DAILY DX E11.9 ?  ?oxyCODONE 5 MG immediate release tablet ?Commonly known as: Oxy  IR/ROXICODONE ?Take 1 tablet (5 mg total) by mouth every 6 (six) hours as needed for up to 5 days for moderate pain or severe pain. ?  ?polyethylene glycol 17 g packet ?Commonly known as: MIRALAX / GLYCOLAX ?Take

## 2021-12-17 NOTE — Progress Notes (Signed)
Patient refused CPAP.

## 2021-12-18 ENCOUNTER — Encounter: Payer: Self-pay | Admitting: *Deleted

## 2021-12-18 DIAGNOSIS — K219 Gastro-esophageal reflux disease without esophagitis: Secondary | ICD-10-CM | POA: Diagnosis not present

## 2021-12-18 DIAGNOSIS — I5040 Unspecified combined systolic (congestive) and diastolic (congestive) heart failure: Secondary | ICD-10-CM | POA: Diagnosis not present

## 2021-12-18 DIAGNOSIS — S32009D Unspecified fracture of unspecified lumbar vertebra, subsequent encounter for fracture with routine healing: Secondary | ICD-10-CM | POA: Diagnosis not present

## 2021-12-18 DIAGNOSIS — I6789 Other cerebrovascular disease: Secondary | ICD-10-CM | POA: Diagnosis not present

## 2021-12-18 DIAGNOSIS — S37892S Contusion of other urinary and pelvic organ, sequela: Secondary | ICD-10-CM | POA: Diagnosis not present

## 2021-12-18 DIAGNOSIS — I2510 Atherosclerotic heart disease of native coronary artery without angina pectoris: Secondary | ICD-10-CM | POA: Diagnosis not present

## 2021-12-18 DIAGNOSIS — S32519D Fracture of superior rim of unspecified pubis, subsequent encounter for fracture with routine healing: Secondary | ICD-10-CM | POA: Diagnosis not present

## 2021-12-18 DIAGNOSIS — J449 Chronic obstructive pulmonary disease, unspecified: Secondary | ICD-10-CM | POA: Diagnosis not present

## 2021-12-18 DIAGNOSIS — K5904 Chronic idiopathic constipation: Secondary | ICD-10-CM | POA: Diagnosis not present

## 2021-12-18 DIAGNOSIS — I480 Paroxysmal atrial fibrillation: Secondary | ICD-10-CM | POA: Diagnosis not present

## 2021-12-18 DIAGNOSIS — N1831 Chronic kidney disease, stage 3a: Secondary | ICD-10-CM | POA: Diagnosis not present

## 2021-12-25 DIAGNOSIS — I5040 Unspecified combined systolic (congestive) and diastolic (congestive) heart failure: Secondary | ICD-10-CM | POA: Diagnosis not present

## 2021-12-25 DIAGNOSIS — S32519D Fracture of superior rim of unspecified pubis, subsequent encounter for fracture with routine healing: Secondary | ICD-10-CM | POA: Diagnosis not present

## 2021-12-25 DIAGNOSIS — S37892S Contusion of other urinary and pelvic organ, sequela: Secondary | ICD-10-CM | POA: Diagnosis not present

## 2021-12-25 DIAGNOSIS — J449 Chronic obstructive pulmonary disease, unspecified: Secondary | ICD-10-CM | POA: Diagnosis not present

## 2021-12-25 DIAGNOSIS — I6789 Other cerebrovascular disease: Secondary | ICD-10-CM | POA: Diagnosis not present

## 2021-12-25 DIAGNOSIS — K5904 Chronic idiopathic constipation: Secondary | ICD-10-CM | POA: Diagnosis not present

## 2021-12-25 DIAGNOSIS — I2510 Atherosclerotic heart disease of native coronary artery without angina pectoris: Secondary | ICD-10-CM | POA: Diagnosis not present

## 2021-12-25 DIAGNOSIS — I480 Paroxysmal atrial fibrillation: Secondary | ICD-10-CM | POA: Diagnosis not present

## 2021-12-25 DIAGNOSIS — N1831 Chronic kidney disease, stage 3a: Secondary | ICD-10-CM | POA: Diagnosis not present

## 2021-12-25 DIAGNOSIS — S32009D Unspecified fracture of unspecified lumbar vertebra, subsequent encounter for fracture with routine healing: Secondary | ICD-10-CM | POA: Diagnosis not present

## 2021-12-31 DIAGNOSIS — S329XXD Fracture of unspecified parts of lumbosacral spine and pelvis, subsequent encounter for fracture with routine healing: Secondary | ICD-10-CM | POA: Diagnosis not present

## 2021-12-31 DIAGNOSIS — S31829A Unspecified open wound of left buttock, initial encounter: Secondary | ICD-10-CM | POA: Diagnosis not present

## 2022-01-02 DIAGNOSIS — M6281 Muscle weakness (generalized): Secondary | ICD-10-CM | POA: Diagnosis not present

## 2022-01-02 DIAGNOSIS — S32519D Fracture of superior rim of unspecified pubis, subsequent encounter for fracture with routine healing: Secondary | ICD-10-CM | POA: Diagnosis not present

## 2022-01-06 ENCOUNTER — Other Ambulatory Visit: Payer: Self-pay

## 2022-01-06 ENCOUNTER — Encounter (HOSPITAL_COMMUNITY): Payer: Self-pay | Admitting: Emergency Medicine

## 2022-01-06 ENCOUNTER — Other Ambulatory Visit: Payer: Self-pay | Admitting: Cardiology

## 2022-01-06 ENCOUNTER — Emergency Department (HOSPITAL_COMMUNITY): Payer: Medicare Other

## 2022-01-06 ENCOUNTER — Inpatient Hospital Stay (HOSPITAL_COMMUNITY)
Admission: EM | Admit: 2022-01-06 | Discharge: 2022-01-21 | DRG: 070 | Disposition: A | Discharge status: Home Health | Payer: Medicare Other | Attending: Internal Medicine | Admitting: Internal Medicine

## 2022-01-06 DIAGNOSIS — I441 Atrioventricular block, second degree: Secondary | ICD-10-CM | POA: Diagnosis not present

## 2022-01-06 DIAGNOSIS — Z87891 Personal history of nicotine dependence: Secondary | ICD-10-CM

## 2022-01-06 DIAGNOSIS — E119 Type 2 diabetes mellitus without complications: Secondary | ICD-10-CM | POA: Diagnosis not present

## 2022-01-06 DIAGNOSIS — F32A Depression, unspecified: Secondary | ICD-10-CM | POA: Diagnosis present

## 2022-01-06 DIAGNOSIS — D696 Thrombocytopenia, unspecified: Secondary | ICD-10-CM | POA: Diagnosis not present

## 2022-01-06 DIAGNOSIS — I495 Sick sinus syndrome: Secondary | ICD-10-CM | POA: Diagnosis not present

## 2022-01-06 DIAGNOSIS — N179 Acute kidney failure, unspecified: Secondary | ICD-10-CM | POA: Diagnosis not present

## 2022-01-06 DIAGNOSIS — N1832 Chronic kidney disease, stage 3b: Secondary | ICD-10-CM | POA: Diagnosis not present

## 2022-01-06 DIAGNOSIS — S12500A Unspecified displaced fracture of sixth cervical vertebra, initial encounter for closed fracture: Secondary | ICD-10-CM

## 2022-01-06 DIAGNOSIS — Z85828 Personal history of other malignant neoplasm of skin: Secondary | ICD-10-CM

## 2022-01-06 DIAGNOSIS — E1165 Type 2 diabetes mellitus with hyperglycemia: Secondary | ICD-10-CM | POA: Diagnosis present

## 2022-01-06 DIAGNOSIS — I951 Orthostatic hypotension: Secondary | ICD-10-CM | POA: Diagnosis not present

## 2022-01-06 DIAGNOSIS — M479 Spondylosis, unspecified: Secondary | ICD-10-CM | POA: Diagnosis present

## 2022-01-06 DIAGNOSIS — Z7189 Other specified counseling: Secondary | ICD-10-CM | POA: Diagnosis not present

## 2022-01-06 DIAGNOSIS — I4430 Unspecified atrioventricular block: Secondary | ICD-10-CM | POA: Diagnosis present

## 2022-01-06 DIAGNOSIS — S12500S Unspecified displaced fracture of sixth cervical vertebra, sequela: Secondary | ICD-10-CM

## 2022-01-06 DIAGNOSIS — Z4682 Encounter for fitting and adjustment of non-vascular catheter: Secondary | ICD-10-CM | POA: Diagnosis not present

## 2022-01-06 DIAGNOSIS — Z515 Encounter for palliative care: Secondary | ICD-10-CM | POA: Diagnosis not present

## 2022-01-06 DIAGNOSIS — E1169 Type 2 diabetes mellitus with other specified complication: Secondary | ICD-10-CM

## 2022-01-06 DIAGNOSIS — Z9049 Acquired absence of other specified parts of digestive tract: Secondary | ICD-10-CM

## 2022-01-06 DIAGNOSIS — W19XXXS Unspecified fall, sequela: Secondary | ICD-10-CM | POA: Diagnosis present

## 2022-01-06 DIAGNOSIS — E1122 Type 2 diabetes mellitus with diabetic chronic kidney disease: Secondary | ICD-10-CM | POA: Diagnosis not present

## 2022-01-06 DIAGNOSIS — Z886 Allergy status to analgesic agent status: Secondary | ICD-10-CM

## 2022-01-06 DIAGNOSIS — K6389 Other specified diseases of intestine: Secondary | ICD-10-CM | POA: Diagnosis not present

## 2022-01-06 DIAGNOSIS — Z7401 Bed confinement status: Secondary | ICD-10-CM | POA: Diagnosis not present

## 2022-01-06 DIAGNOSIS — S12501S Unspecified nondisplaced fracture of sixth cervical vertebra, sequela: Secondary | ICD-10-CM | POA: Diagnosis not present

## 2022-01-06 DIAGNOSIS — G4733 Obstructive sleep apnea (adult) (pediatric): Secondary | ICD-10-CM | POA: Diagnosis not present

## 2022-01-06 DIAGNOSIS — Z7984 Long term (current) use of oral hypoglycemic drugs: Secondary | ICD-10-CM

## 2022-01-06 DIAGNOSIS — Z825 Family history of asthma and other chronic lower respiratory diseases: Secondary | ICD-10-CM

## 2022-01-06 DIAGNOSIS — J449 Chronic obstructive pulmonary disease, unspecified: Secondary | ICD-10-CM | POA: Diagnosis not present

## 2022-01-06 DIAGNOSIS — Z79899 Other long term (current) drug therapy: Secondary | ICD-10-CM

## 2022-01-06 DIAGNOSIS — Z880 Allergy status to penicillin: Secondary | ICD-10-CM

## 2022-01-06 DIAGNOSIS — K219 Gastro-esophageal reflux disease without esophagitis: Secondary | ICD-10-CM | POA: Diagnosis present

## 2022-01-06 DIAGNOSIS — I2510 Atherosclerotic heart disease of native coronary artery without angina pectoris: Secondary | ICD-10-CM | POA: Diagnosis not present

## 2022-01-06 DIAGNOSIS — Z8679 Personal history of other diseases of the circulatory system: Secondary | ICD-10-CM

## 2022-01-06 DIAGNOSIS — I5022 Chronic systolic (congestive) heart failure: Secondary | ICD-10-CM | POA: Diagnosis not present

## 2022-01-06 DIAGNOSIS — Z950 Presence of cardiac pacemaker: Secondary | ICD-10-CM

## 2022-01-06 DIAGNOSIS — E785 Hyperlipidemia, unspecified: Secondary | ICD-10-CM | POA: Diagnosis present

## 2022-01-06 DIAGNOSIS — Z6823 Body mass index (BMI) 23.0-23.9, adult: Secondary | ICD-10-CM | POA: Diagnosis not present

## 2022-01-06 DIAGNOSIS — Z923 Personal history of irradiation: Secondary | ICD-10-CM

## 2022-01-06 DIAGNOSIS — E43 Unspecified severe protein-calorie malnutrition: Secondary | ICD-10-CM | POA: Diagnosis not present

## 2022-01-06 DIAGNOSIS — Z8042 Family history of malignant neoplasm of prostate: Secondary | ICD-10-CM

## 2022-01-06 DIAGNOSIS — G9341 Metabolic encephalopathy: Principal | ICD-10-CM | POA: Diagnosis present

## 2022-01-06 DIAGNOSIS — Z7901 Long term (current) use of anticoagulants: Secondary | ICD-10-CM

## 2022-01-06 DIAGNOSIS — R339 Retention of urine, unspecified: Secondary | ICD-10-CM | POA: Diagnosis not present

## 2022-01-06 DIAGNOSIS — N189 Chronic kidney disease, unspecified: Secondary | ICD-10-CM

## 2022-01-06 DIAGNOSIS — Z96653 Presence of artificial knee joint, bilateral: Secondary | ICD-10-CM | POA: Diagnosis present

## 2022-01-06 DIAGNOSIS — Z8673 Personal history of transient ischemic attack (TIA), and cerebral infarction without residual deficits: Secondary | ICD-10-CM

## 2022-01-06 DIAGNOSIS — R531 Weakness: Secondary | ICD-10-CM | POA: Diagnosis not present

## 2022-01-06 DIAGNOSIS — G9340 Encephalopathy, unspecified: Secondary | ICD-10-CM | POA: Diagnosis present

## 2022-01-06 DIAGNOSIS — F02B11 Dementia in other diseases classified elsewhere, moderate, with agitation: Secondary | ICD-10-CM | POA: Diagnosis not present

## 2022-01-06 DIAGNOSIS — I480 Paroxysmal atrial fibrillation: Secondary | ICD-10-CM | POA: Diagnosis not present

## 2022-01-06 DIAGNOSIS — Z8701 Personal history of pneumonia (recurrent): Secondary | ICD-10-CM

## 2022-01-06 DIAGNOSIS — I6381 Other cerebral infarction due to occlusion or stenosis of small artery: Secondary | ICD-10-CM | POA: Diagnosis not present

## 2022-01-06 DIAGNOSIS — Z87440 Personal history of urinary (tract) infections: Secondary | ICD-10-CM

## 2022-01-06 DIAGNOSIS — Z951 Presence of aortocoronary bypass graft: Secondary | ICD-10-CM | POA: Diagnosis not present

## 2022-01-06 DIAGNOSIS — S32591S Other specified fracture of right pubis, sequela: Secondary | ICD-10-CM

## 2022-01-06 DIAGNOSIS — R109 Unspecified abdominal pain: Secondary | ICD-10-CM | POA: Diagnosis not present

## 2022-01-06 DIAGNOSIS — I700 Atherosclerosis of aorta: Secondary | ICD-10-CM | POA: Diagnosis not present

## 2022-01-06 DIAGNOSIS — I130 Hypertensive heart and chronic kidney disease with heart failure and stage 1 through stage 4 chronic kidney disease, or unspecified chronic kidney disease: Secondary | ICD-10-CM | POA: Diagnosis present

## 2022-01-06 DIAGNOSIS — Z8249 Family history of ischemic heart disease and other diseases of the circulatory system: Secondary | ICD-10-CM

## 2022-01-06 DIAGNOSIS — M2578 Osteophyte, vertebrae: Secondary | ICD-10-CM | POA: Diagnosis not present

## 2022-01-06 DIAGNOSIS — Z931 Gastrostomy status: Secondary | ICD-10-CM | POA: Diagnosis not present

## 2022-01-06 DIAGNOSIS — G309 Alzheimer's disease, unspecified: Secondary | ICD-10-CM | POA: Diagnosis not present

## 2022-01-06 DIAGNOSIS — M47812 Spondylosis without myelopathy or radiculopathy, cervical region: Secondary | ICD-10-CM | POA: Diagnosis not present

## 2022-01-06 DIAGNOSIS — I10 Essential (primary) hypertension: Secondary | ICD-10-CM | POA: Diagnosis not present

## 2022-01-06 DIAGNOSIS — Z8546 Personal history of malignant neoplasm of prostate: Secondary | ICD-10-CM

## 2022-01-06 DIAGNOSIS — M4312 Spondylolisthesis, cervical region: Secondary | ICD-10-CM | POA: Diagnosis not present

## 2022-01-06 DIAGNOSIS — Z87898 Personal history of other specified conditions: Secondary | ICD-10-CM

## 2022-01-06 DIAGNOSIS — E876 Hypokalemia: Secondary | ICD-10-CM | POA: Diagnosis present

## 2022-01-06 DIAGNOSIS — R1310 Dysphagia, unspecified: Secondary | ICD-10-CM | POA: Diagnosis not present

## 2022-01-06 DIAGNOSIS — R471 Dysarthria and anarthria: Secondary | ICD-10-CM | POA: Insufficient documentation

## 2022-01-06 DIAGNOSIS — M19011 Primary osteoarthritis, right shoulder: Secondary | ICD-10-CM | POA: Diagnosis present

## 2022-01-06 DIAGNOSIS — R638 Other symptoms and signs concerning food and fluid intake: Secondary | ICD-10-CM | POA: Diagnosis not present

## 2022-01-06 DIAGNOSIS — I5020 Unspecified systolic (congestive) heart failure: Secondary | ICD-10-CM | POA: Diagnosis present

## 2022-01-06 DIAGNOSIS — M19012 Primary osteoarthritis, left shoulder: Secondary | ICD-10-CM | POA: Diagnosis present

## 2022-01-06 DIAGNOSIS — Z885 Allergy status to narcotic agent status: Secondary | ICD-10-CM

## 2022-01-06 DIAGNOSIS — R410 Disorientation, unspecified: Secondary | ICD-10-CM | POA: Diagnosis not present

## 2022-01-06 DIAGNOSIS — Z87442 Personal history of urinary calculi: Secondary | ICD-10-CM

## 2022-01-06 DIAGNOSIS — R4182 Altered mental status, unspecified: Secondary | ICD-10-CM | POA: Diagnosis present

## 2022-01-06 LAB — CBC
HCT: 38.1 % — ABNORMAL LOW (ref 39.0–52.0)
Hemoglobin: 11.1 g/dL — ABNORMAL LOW (ref 13.0–17.0)
MCH: 31.6 pg (ref 26.0–34.0)
MCHC: 29.1 g/dL — ABNORMAL LOW (ref 30.0–36.0)
MCV: 108.5 fL — ABNORMAL HIGH (ref 80.0–100.0)
Platelets: 117 10*3/uL — ABNORMAL LOW (ref 150–400)
RBC: 3.51 MIL/uL — ABNORMAL LOW (ref 4.22–5.81)
RDW: 19 % — ABNORMAL HIGH (ref 11.5–15.5)
WBC: 8.5 10*3/uL (ref 4.0–10.5)
nRBC: 0 % (ref 0.0–0.2)

## 2022-01-06 LAB — URINALYSIS, ROUTINE W REFLEX MICROSCOPIC
Bilirubin Urine: NEGATIVE
Glucose, UA: NEGATIVE mg/dL
Ketones, ur: 5 mg/dL — AB
Nitrite: NEGATIVE
Protein, ur: 100 mg/dL — AB
Specific Gravity, Urine: 1.017 (ref 1.005–1.030)
pH: 5 (ref 5.0–8.0)

## 2022-01-06 LAB — RAPID URINE DRUG SCREEN, HOSP PERFORMED
Amphetamines: NOT DETECTED
Barbiturates: NOT DETECTED
Benzodiazepines: NOT DETECTED
Cocaine: NOT DETECTED
Opiates: NOT DETECTED
Tetrahydrocannabinol: NOT DETECTED

## 2022-01-06 LAB — I-STAT CHEM 8, ED
BUN: 33 mg/dL — ABNORMAL HIGH (ref 8–23)
Calcium, Ion: 1.02 mmol/L — ABNORMAL LOW (ref 1.15–1.40)
Chloride: 112 mmol/L — ABNORMAL HIGH (ref 98–111)
Creatinine, Ser: 1.6 mg/dL — ABNORMAL HIGH (ref 0.61–1.24)
Glucose, Bld: 138 mg/dL — ABNORMAL HIGH (ref 70–99)
HCT: 33 % — ABNORMAL LOW (ref 39.0–52.0)
Hemoglobin: 11.2 g/dL — ABNORMAL LOW (ref 13.0–17.0)
Potassium: 3.5 mmol/L (ref 3.5–5.1)
Sodium: 141 mmol/L (ref 135–145)
TCO2: 20 mmol/L — ABNORMAL LOW (ref 22–32)

## 2022-01-06 LAB — COMPREHENSIVE METABOLIC PANEL
ALT: 23 U/L (ref 0–44)
AST: 20 U/L (ref 15–41)
Albumin: 2.5 g/dL — ABNORMAL LOW (ref 3.5–5.0)
Alkaline Phosphatase: 173 U/L — ABNORMAL HIGH (ref 38–126)
Anion gap: 12 (ref 5–15)
BUN: 32 mg/dL — ABNORMAL HIGH (ref 8–23)
CO2: 20 mmol/L — ABNORMAL LOW (ref 22–32)
Calcium: 9 mg/dL (ref 8.9–10.3)
Chloride: 112 mmol/L — ABNORMAL HIGH (ref 98–111)
Creatinine, Ser: 1.66 mg/dL — ABNORMAL HIGH (ref 0.61–1.24)
GFR, Estimated: 40 mL/min — ABNORMAL LOW (ref >60)
Glucose, Bld: 141 mg/dL — ABNORMAL HIGH (ref 70–99)
Potassium: 3.6 mmol/L (ref 3.5–5.1)
Sodium: 144 mmol/L (ref 135–145)
Total Bilirubin: 1.6 mg/dL — ABNORMAL HIGH (ref 0.3–1.2)
Total Protein: 5.5 g/dL — ABNORMAL LOW (ref 6.5–8.1)

## 2022-01-06 LAB — DIFFERENTIAL
Abs Immature Granulocytes: 0.09 10*3/uL — ABNORMAL HIGH (ref 0.00–0.07)
Basophils Absolute: 0 10*3/uL (ref 0.0–0.1)
Basophils Relative: 1 %
Eosinophils Absolute: 0 10*3/uL (ref 0.0–0.5)
Eosinophils Relative: 0 %
Immature Granulocytes: 1 %
Lymphocytes Relative: 16 %
Lymphs Abs: 1.4 10*3/uL (ref 0.7–4.0)
Monocytes Absolute: 0.6 10*3/uL (ref 0.1–1.0)
Monocytes Relative: 7 %
Neutro Abs: 6.4 10*3/uL (ref 1.7–7.7)
Neutrophils Relative %: 75 %

## 2022-01-06 LAB — GLUCOSE, CAPILLARY: Glucose-Capillary: 100 mg/dL — ABNORMAL HIGH (ref 70–99)

## 2022-01-06 LAB — AMMONIA: Ammonia: 15 umol/L (ref 9–35)

## 2022-01-06 LAB — ETHANOL: Alcohol, Ethyl (B): <10 mg/dL (ref <10)

## 2022-01-06 LAB — TSH: TSH: 1.084 u[IU]/mL (ref 0.350–4.500)

## 2022-01-06 LAB — HEMOGLOBIN A1C
Hgb A1c MFr Bld: 5.2 % (ref 4.8–5.6)
Mean Plasma Glucose: 102.54 mg/dL

## 2022-01-06 LAB — APTT: aPTT: 29 seconds (ref 24–36)

## 2022-01-06 LAB — PROTIME-INR
INR: 1.3 — ABNORMAL HIGH (ref 0.8–1.2)
Prothrombin Time: 15.6 seconds — ABNORMAL HIGH (ref 11.4–15.2)

## 2022-01-06 LAB — VITAMIN B12: Vitamin B-12: 366 pg/mL (ref 180–914)

## 2022-01-06 MED ORDER — LACTATED RINGERS IV BOLUS
1000.0000 mL | Freq: Once | INTRAVENOUS | Status: AC
  Administered 2022-01-06: 1000 mL via INTRAVENOUS

## 2022-01-06 MED ORDER — SODIUM CHLORIDE 0.9 % IV SOLN: INTRAVENOUS | Status: DC

## 2022-01-06 MED ORDER — MIRTAZAPINE 7.5 MG PO TABS
7.5000 mg | ORAL_TABLET | Freq: Every day | ORAL | Status: DC
Start: 2022-01-06 — End: 2022-01-19
  Administered 2022-01-06 – 2022-01-18 (×13): 7.5 mg via ORAL
  Filled 2022-01-06 (×13): qty 1

## 2022-01-06 MED ORDER — AZELASTINE HCL 0.1 % NA SOLN
1.0000 | Freq: Two times a day (BID) | NASAL | Status: DC
  Administered 2022-01-06 – 2022-01-21 (×25): 1 via NASAL
  Filled 2022-01-06: qty 30

## 2022-01-06 MED ORDER — ACETAMINOPHEN 160 MG/5ML PO SOLN: 650.0000 mg | ORAL | Status: DC | PRN

## 2022-01-06 MED ORDER — AMIODARONE HCL 200 MG PO TABS
200.0000 mg | ORAL_TABLET | Freq: Every day | ORAL | Status: DC
Start: 2022-01-07 — End: 2022-01-19
  Administered 2022-01-09 – 2022-01-18 (×8): 200 mg via ORAL
  Filled 2022-01-06 (×11): qty 1

## 2022-01-06 MED ORDER — INSULIN ASPART 100 UNIT/ML IJ SOLN
0.0000 [IU] | Freq: Three times a day (TID) | INTRAMUSCULAR | Status: DC
  Administered 2022-01-09 – 2022-01-14 (×7): 1 [IU] via SUBCUTANEOUS
  Administered 2022-01-16 – 2022-01-18 (×3): 2 [IU] via SUBCUTANEOUS
  Administered 2022-01-18: 3 [IU] via SUBCUTANEOUS
  Administered 2022-01-19: 2 [IU] via SUBCUTANEOUS
  Administered 2022-01-19: 1 [IU] via SUBCUTANEOUS
  Administered 2022-01-19: 2 [IU] via SUBCUTANEOUS
  Administered 2022-01-20: 3 [IU] via SUBCUTANEOUS
  Administered 2022-01-20 – 2022-01-21 (×2): 2 [IU] via SUBCUTANEOUS

## 2022-01-06 MED ORDER — MOMETASONE FURO-FORMOTEROL FUM 200-5 MCG/ACT IN AERO
2.0000 | INHALATION_SPRAY | Freq: Two times a day (BID) | RESPIRATORY_TRACT | Status: DC
  Administered 2022-01-07 – 2022-01-21 (×27): 2 via RESPIRATORY_TRACT
  Filled 2022-01-06: qty 8.8

## 2022-01-06 MED ORDER — ACETAMINOPHEN 325 MG PO TABS
650.0000 mg | ORAL_TABLET | ORAL | Status: DC | PRN
  Administered 2022-01-06: 650 mg via ORAL
  Filled 2022-01-06 (×2): qty 2

## 2022-01-06 MED ORDER — VENLAFAXINE HCL ER 37.5 MG PO CP24
37.5000 mg | ORAL_CAPSULE | ORAL | Status: DC
  Administered 2022-01-12 – 2022-01-20 (×5): 37.5 mg via ORAL
  Filled 2022-01-06 (×7): qty 1

## 2022-01-06 MED ORDER — ROSUVASTATIN CALCIUM 20 MG PO TABS
40.0000 mg | ORAL_TABLET | Freq: Every day | ORAL | Status: DC
Start: 2022-01-06 — End: 2022-01-19
  Administered 2022-01-06 – 2022-01-18 (×13): 40 mg via ORAL
  Filled 2022-01-06 (×13): qty 2

## 2022-01-06 MED ORDER — ACETAMINOPHEN 650 MG RE SUPP: 650.0000 mg | RECTAL | Status: DC | PRN

## 2022-01-06 MED ORDER — SENNOSIDES-DOCUSATE SODIUM 8.6-50 MG PO TABS: 1.0000 | ORAL_TABLET | Freq: Every evening | ORAL | Status: DC | PRN

## 2022-01-06 MED ORDER — OXYCODONE HCL 5 MG PO TABS
5.0000 mg | ORAL_TABLET | Freq: Four times a day (QID) | ORAL | Status: DC | PRN
  Administered 2022-01-06: 5 mg via ORAL
  Filled 2022-01-06 (×2): qty 1

## 2022-01-06 MED ORDER — PANTOPRAZOLE SODIUM 40 MG PO TBEC
40.0000 mg | DELAYED_RELEASE_TABLET | Freq: Every day | ORAL | Status: DC
Start: 2022-01-07 — End: 2022-01-19
  Administered 2022-01-09 – 2022-01-18 (×7): 40 mg via ORAL
  Filled 2022-01-06 (×10): qty 1

## 2022-01-06 MED ORDER — DOCUSATE SODIUM 100 MG PO CAPS
100.0000 mg | ORAL_CAPSULE | Freq: Two times a day (BID) | ORAL | Status: DC
Start: 2022-01-06 — End: 2022-01-17
  Administered 2022-01-06 – 2022-01-17 (×12): 100 mg via ORAL
  Filled 2022-01-06 (×18): qty 1
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
## 2022-01-06 NOTE — Assessment & Plan Note (Addendum)
Orthopedic surgery was consulted during admit in April with recommendations of non operative management with weightbearing at tolerated bilateral lower extremities. Follow up in 3 weeks? ?Completed rehab at SNF and was due for d/c home today ?PT ordered  ?Continue oxycodone  ?

## 2022-01-06 NOTE — ED Notes (Signed)
Daughter reports patient has cervical fx and was suppose to be in a [place] J and the facility broke it and was suppose to order a new one and now 20 days later still didn't have a miami j.  Spoke with [place] and requested [place] put on a c collar. C collar in place ?

## 2022-01-06 NOTE — ED Provider Notes (Signed)
?[place] ?Provider Note ? ? ?CSN: [id number] ?Arrival date & time: 01/06/22  1219 ? ?  ? ?History ?Chief Complaint  ?Patient presents with  ? Altered Mental Status  ? ? ?[name] is a 88 y.o. male with past medical history of HFrEF (Ef 35% in 2021), AV block s/p pacemaker, and CABG who presents due to altered mental status and slurred speech.  He states that slurred speech has been present the last several days. He was treated for UTI and finished antibiotic course yesterday per daughter.  Currently resides at a rehab facility after sustaining a fall about 4 weeks ago where he had a cervical fracture.  At baseline, his daughter states that he is oriented x4. ? ?  ? ?Home Medications ?Prior to Admission medications   ?Medication Sig Start Date End Date Taking? Authorizing Provider  ?acetaminophen (TYLENOL) 500 MG tablet Take 500-1,000 mg by mouth every 6 (six) hours as needed for moderate pain.    [provider]  ?albuterol (PROVENTIL HFA;VENTOLIN HFA) 108 (90 Base) MCG/ACT inhaler Inhale 2 puffs into the lungs every 6 (six) hours as needed for wheezing or shortness of breath. ?Patient not taking: Reported on 12/11/2021 02/12/18   [name], [name]  ?amiodarone (PACERONE) 200 MG tablet TAKE 1 TABLET BY MOUTH  DAILY ?Patient taking differently: Take 200 mg by mouth at bedtime. 10/14/21   Arnoldo Lenis, MD  ?apixaban (ELIQUIS) 5 MG TABS tablet Take 1 tablet (5 mg total) by mouth 2 (two) times daily. Do not resume until cardiology follow up 12/17/21   Winferd Humphrey, PA-C  ?azelastine (ASTELIN) 0.1 % nasal spray Place 1 spray into both nostrils 2 (two) times daily. ?Patient not taking: Reported on 12/11/2021 10/15/21   [name], DO  ?bisacodyl (DULCOLAX) 10 MG suppository Place 1 suppository (10 mg total) rectally daily as needed for moderate constipation. 12/17/21   Winferd Humphrey, PA-C  ?budesonide-formoterol (SYMBICORT) 160-4.5 MCG/ACT  inhaler Inhale 2 puffs into the lungs 2 (two) times daily. ?Patient not taking: Reported on 12/11/2021 03/22/19   [name], [name]  ?desvenlafaxine (PRISTIQ) 25 MG 24 hr tablet Take 1 tablet (25 mg total) by mouth daily. ?Patient taking differently: Take 25 mg by mouth every other day. 12/09/21   [name], DO  ?docusate sodium (COLACE) 100 MG capsule Take 1 capsule (100 mg total) by mouth 2 (two) times daily. 12/17/21   Winferd Humphrey, PA-C  ?fluticasone (FLONASE) 50 MCG/ACT nasal spray Place 2 sprays into both nostrils daily as needed for allergies or rhinitis.    [provider]  ?furosemide (LASIX) 20 MG tablet Take 1 tablet (20 mg total) by mouth every other day. Do not resume until cardiology follow up 12/17/21   Winferd Humphrey, PA-C  ?ketoconazole (NIZORAL) 2 % cream Apply topically daily. ?Patient not taking: Reported on 12/11/2021 06/28/21   Kerney Elbe, DO  ?Lancets (ONETOUCH ULTRASOFT) lancets Use to check blood sugars daily 10/16/17   [name], [name]  ?meclizine (ANTIVERT) 25 MG tablet Take 1 tablet (25 mg total) by mouth 3 (three) times daily as needed for dizziness. 05/13/21   Sherwood Gambler, MD  ?metFORMIN (GLUCOPHAGE) 500 MG tablet Take 1 tablet (500 mg total) by mouth daily with breakfast. 10/15/21   Ronnie Doss M, DO  ?metoprolol succinate (TOPROL-XL) 25 MG 24 hr tablet Take 0.5 tablets (12.5 mg total) by mouth daily. Do not resume until cardiology follow up 12/17/21  Winferd Humphrey, PA-C  ?omeprazole (PRILOSEC) 20 MG capsule TAKE 1 CAPSULE BY MOUTH  DAILY ?Patient taking differently: Take 20 mg by mouth daily. 10/09/21   [name], DO  ?ONETOUCH VERIO test strip TEST BLOOD SUGARS DAILY DX E11.9 07/22/21   Ronnie Doss M, DO  ?rosuvastatin (CRESTOR) 40 MG tablet Take 1 tablet (40 mg total) by mouth daily. To REPLACE Atorvastatin ?Patient taking differently: Take 40 mg by mouth at bedtime. 03/11/21   [name], DO   ?sacubitril-valsartan (ENTRESTO) 24-26 MG Take 1 tablet by mouth 2 (two) times daily. Do not resume until cardiology follow up 12/17/21   Winferd Humphrey, PA-C  ?spironolactone (ALDACTONE) 25 MG tablet Take 0.5 tablets (12.5 mg total) by mouth daily. Do not resume until cardiology follow up 12/17/21   Winferd Humphrey, PA-C  ?   ? ?Allergies    ?Penicillins, Tramadol, and Ketorolac tromethamine   ? ?Review of Systems   ?Review of Systems  ?Constitutional:  Negative for chills and fever.  ?Respiratory:  Positive for shortness of breath.   ?Cardiovascular:  Negative for chest pain.  ?Gastrointestinal:  Negative for abdominal pain.  ?Genitourinary:  Negative for dysuria.  ? ?Physical Exam ?Updated Vital Signs ?BP 122/78   Pulse 88   Temp (!) 97.5 ?F (36.4 ?C) (Oral)   Resp (!) 22   SpO2 93%  ?Physical Exam ?Constitutional:   ?   Comments: Elderly appearing male, follows some commands, hard of hearing  ?HENT:  ?   Mouth/Throat:  ?   Mouth: Mucous membranes are dry.  ?Cardiovascular:  ?   Comments: Paced rhythm ?Pulmonary:  ?   Effort: Pulmonary effort is normal.  ?   Breath sounds: Normal breath sounds.  ?Abdominal:  ?   General: Abdomen is flat. Bowel sounds are normal.  ?   Palpations: Abdomen is soft.  ?Musculoskeletal:     ?   General: No swelling or tenderness.  ?   Cervical back: Normal range of motion.  ?Skin: ?   General: Skin is warm and dry.  ?Neurological:  ?   Comments: Patient was not able to follow directions for EOM testing, he does have slurred speech and right sided drooping of his mouth. Muscle strength is 5/5 in upper extremities bilaterally, 3/5 in bilateral lower extremities  ? ? ?ED Results / Procedures / Treatments   ?Labs ?(all labs ordered are listed, but only abnormal results are displayed) ?Labs Reviewed  ?PROTIME-INR - Abnormal; Notable for the following components:  ?    Result Value  ? Prothrombin Time 15.6 (*)   ? INR 1.3 (*)   ? All other components within normal limits  ?CBC -  Abnormal; Notable for the following components:  ? RBC 3.51 (*)   ? Hemoglobin 11.1 (*)   ? HCT 38.1 (*)   ? MCV 108.5 (*)   ? MCHC 29.1 (*)   ? RDW 19.0 (*)   ? Platelets 117 (*)   ? All other components within normal limits  ?DIFFERENTIAL - Abnormal; Notable for the following components:  ? Abs Immature Granulocytes 0.09 (*)   ? All other components within normal limits  ?COMPREHENSIVE METABOLIC PANEL - Abnormal; Notable for the following components:  ? Chloride 112 (*)   ? CO2 20 (*)   ? Glucose, Bld 141 (*)   ? BUN 32 (*)   ? Creatinine, Ser 1.66 (*)   ? Total Protein 5.5 (*)   ? Albumin 2.5 (*)   ?  Alkaline Phosphatase 173 (*)   ? Total Bilirubin 1.6 (*)   ? GFR, Estimated 40 (*)   ? All other components within normal limits  ?I-STAT CHEM 8, ED - Abnormal; Notable for the following components:  ? Chloride 112 (*)   ? BUN 33 (*)   ? Creatinine, Ser 1.60 (*)   ? Glucose, Bld 138 (*)   ? Calcium, Ion 1.02 (*)   ? TCO2 20 (*)   ? Hemoglobin 11.2 (*)   ? HCT 33.0 (*)   ? All other components within normal limits  ?URINE CULTURE  ?ETHANOL  ?APTT  ?RAPID URINE DRUG SCREEN, HOSP PERFORMED  ?URINALYSIS, ROUTINE W REFLEX MICROSCOPIC  ? ? ?EKG ?EKG Interpretation ? ?Date/Time:  Monday Jan 06 2022 12:31:06 EDT ?Ventricular Rate:  89 ?PR Interval:    ?QRS Duration: 208 ?QT Interval:  532 ?QTC Calculation: 647 ?R Axis:   -69 ?Text Interpretation: Ventricular-paced rhythm Abnormal ECG When compared with ECG of 10-Dec-2021 21:14, PREVIOUS ECG IS PRESENT when compared to prior, similar appearance with faster? paced rate. No STEMI Confirmed by Antony Blackbird [phone]) on 01/06/2022 2:29:48 PM ? ?Radiology ?CT HEAD WO CONTRAST ? ?Result Date: 01/06/2022 ?CLINICAL DATA:  Provided history: Delirium. Additional history provided by scanning technologist: Altered mental status, delirium. EXAM: CT HEAD WITHOUT CONTRAST TECHNIQUE: Contiguous axial images were obtained from the base of the skull through the vertex without intravenous  contrast. RADIATION DOSE REDUCTION: This exam was performed according to the departmental dose-optimization program which includes automated exposure control, adjustment of the mA and/or kV according to patient size and/or [place]

## 2022-01-06 NOTE — Assessment & Plan Note (Addendum)
Chronic and stable, denies any chest pain.   ?Continue crestor, beta blocker and eliquis held  ?

## 2022-01-06 NOTE — Assessment & Plan Note (Addendum)
A1C of 5.7 in 2/23, repeat pending for AM with CVA w/u  ?Hold metformin while inpatient ?SSI and accuchecks  ?

## 2022-01-06 NOTE — Assessment & Plan Note (Signed)
Stable on room air without respiratory symptoms.  Continue Symbicort and albuterol as needed. ?? ?

## 2022-01-06 NOTE — Assessment & Plan Note (Signed)
S/p PPM 
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
## 2022-01-06 NOTE — Assessment & Plan Note (Signed)
At baseline, continue to monitor °

## 2022-01-06 NOTE — Assessment & Plan Note (Addendum)
Renal function normal back in fall of 2022 with baseline of 1.1; however, this has trended upward and new baseline appears to be around 1.2-1.4. poor PO intake at rehab  ?-UA pending ?-strict I/O ?-hold nephrotoxic drugs ?-received 1L IVF in ED. EF of 35%, but appears dry on exam will give 75 cc/hour x5 hours  ?-trend  ?
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
## 2022-01-06 NOTE — ED Provider Triage Note (Signed)
Emergency Medicine Provider Triage Evaluation Note ? ?[name] , a 88 y.o. male  was evaluated in triage.  Pt complains of hypotension, altered mental status.  Symptoms began earlier today while staff attempted to give him morning medications. They found him less alert than usual.  Hypotensive to 80/60 initially and received 1 L of fluids with improvement in mental status.  Patient denies any pain. States he wants to go home. ? ?Review of Systems  ?Positive: Altered mental status ?Negative: Chest pain ? ?Physical Exam  ?BP (!) 116/59 (BP Location: Right Arm)   Pulse 89   Temp (!) 97.5 ?F (36.4 ?C) (Oral)   Resp 17   SpO2 94%  ?Gen:   Awake, no distress   ?Resp:  Normal effort  ?MSK:   Moves extremities without difficulty  ?Other:  Strength 5/5 in bilateral upper extremities, moving lower extremities without difficulty.  Alert to self. No facial asymmetry. ? ?Medical Decision Making  ?Medically screening exam initiated at 12:31 PM.  Appropriate orders placed.  [name] was informed that the remainder of the evaluation will be completed by another provider, this initial triage assessment does not replace that evaluation, and the importance of remaining in the ED until their evaluation is complete. ? ?Attempted to contact [facility] without answer to obtain more information. Do not see indication for code stroke at this time due to patient out of window (woke up with less responsiveness) for TPA. Will attempt to room soon ?  ?Delia Heady, PA-C ?01/06/22 1248 ? ?

## 2022-01-06 NOTE — ED Triage Notes (Signed)
Pt arrives via EMS from [facility] rehab for a recent fall- pt being treated for UTI and finished antibiotics yesterday. Pt was found to be less responsive by staff, EMS BP 80/60 initially- pt received 1 liter of fluid improved to where pt was more alert. Pt has slurred speech, only alert to name. Pt is normally alert/oriented x4. CBg 167, HR 120 then 90 with fluid. Pt has pacemaker. ?

## 2022-01-06 NOTE — Assessment & Plan Note (Addendum)
88 year old male presenting with new onset AMS and week long history of slurred speech ?-obs to telemetry ?-CT head with no acute finding ?-concern for possible stroke with AMS from baseline and slurred speech. Brain MRI has been ordered, but has PPM so will likely have this done tomorrow  ?-complaints of dysuria, UA pending. Recently treated for UTI, culture pending as well ?-no other suspicion for infectious source ?-metabolic labs pending ?-he is pretty conversational, but not quite to baseline per family. Usually is alert and oriented x 4 ?-gentle IVF ?
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
## 2022-01-06 NOTE — Assessment & Plan Note (Signed)
cpap intolerant, supplemental oxygen as needed  ?

## 2022-01-06 NOTE — Assessment & Plan Note (Signed)
Continue crestor 
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
## 2022-01-06 NOTE — Assessment & Plan Note (Addendum)
Currently rate controlled.  Continue amiodarone, eliquis held with pelvic hematoma  ?

## 2022-01-07 ENCOUNTER — Observation Stay (HOSPITAL_COMMUNITY): Payer: Medicare Other

## 2022-01-07 ENCOUNTER — Inpatient Hospital Stay: Payer: Medicare Other | Admitting: Family Medicine

## 2022-01-07 DIAGNOSIS — Z515 Encounter for palliative care: Secondary | ICD-10-CM | POA: Diagnosis not present

## 2022-01-07 DIAGNOSIS — R1310 Dysphagia, unspecified: Secondary | ICD-10-CM | POA: Diagnosis not present

## 2022-01-07 DIAGNOSIS — M4312 Spondylolisthesis, cervical region: Secondary | ICD-10-CM | POA: Diagnosis not present

## 2022-01-07 DIAGNOSIS — R531 Weakness: Secondary | ICD-10-CM | POA: Diagnosis not present

## 2022-01-07 DIAGNOSIS — M47812 Spondylosis without myelopathy or radiculopathy, cervical region: Secondary | ICD-10-CM | POA: Diagnosis not present

## 2022-01-07 DIAGNOSIS — S12500A Unspecified displaced fracture of sixth cervical vertebra, initial encounter for closed fracture: Secondary | ICD-10-CM | POA: Diagnosis not present

## 2022-01-07 DIAGNOSIS — M2578 Osteophyte, vertebrae: Secondary | ICD-10-CM | POA: Diagnosis not present

## 2022-01-07 DIAGNOSIS — Z7189 Other specified counseling: Secondary | ICD-10-CM | POA: Diagnosis not present

## 2022-01-07 DIAGNOSIS — E43 Unspecified severe protein-calorie malnutrition: Secondary | ICD-10-CM | POA: Insufficient documentation

## 2022-01-07 DIAGNOSIS — G9340 Encephalopathy, unspecified: Secondary | ICD-10-CM | POA: Diagnosis not present

## 2022-01-07 LAB — LIPID PANEL
Cholesterol: 81 mg/dL (ref 0–200)
HDL: 31 mg/dL — ABNORMAL LOW (ref >40)
LDL Cholesterol: 30 mg/dL (ref 0–99)
Total CHOL/HDL Ratio: 2.6 RATIO
Triglycerides: 102 mg/dL (ref <150)
VLDL: 20 mg/dL (ref 0–40)

## 2022-01-07 LAB — GLUCOSE, CAPILLARY
Glucose-Capillary: 107 mg/dL — ABNORMAL HIGH (ref 70–99)
Glucose-Capillary: 108 mg/dL — ABNORMAL HIGH (ref 70–99)
Glucose-Capillary: 85 mg/dL (ref 70–99)
Glucose-Capillary: 82 mg/dL (ref 70–99)

## 2022-01-07 MED ORDER — APIXABAN 5 MG PO TABS
5.0000 mg | ORAL_TABLET | Freq: Two times a day (BID) | ORAL | Status: DC
  Administered 2022-01-07 – 2022-01-09 (×3): 5 mg via ORAL
  Filled 2022-01-07 (×4): qty 1

## 2022-01-07 MED ORDER — ACETAMINOPHEN 650 MG RE SUPP: 650.0000 mg | Freq: Four times a day (QID) | RECTAL | Status: DC

## 2022-01-07 MED ORDER — SODIUM CHLORIDE 0.9 % IV SOLN: INTRAVENOUS | Status: DC

## 2022-01-07 MED ORDER — ACETAMINOPHEN 325 MG PO TABS
650.0000 mg | ORAL_TABLET | Freq: Four times a day (QID) | ORAL | Status: AC
  Administered 2022-01-07 (×3): 650 mg via ORAL
  Filled 2022-01-07 (×3): qty 2

## 2022-01-07 MED ORDER — OXYCODONE HCL 5 MG PO TABS
5.0000 mg | ORAL_TABLET | ORAL | Status: DC | PRN
  Administered 2022-01-07 – 2022-01-18 (×9): 5 mg via ORAL
  Filled 2022-01-07 (×9): qty 1

## 2022-01-07 MED ORDER — NYSTATIN 100000 UNIT/ML MT SUSP
5.0000 mL | Freq: Four times a day (QID) | OROMUCOSAL | Status: DC
Start: 2022-01-07 — End: 2022-01-17
  Administered 2022-01-07 – 2022-01-17 (×16): 500000 [IU] via ORAL
  Filled 2022-01-07 (×12): qty 5
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
## 2022-01-07 NOTE — Progress Notes (Signed)
Patient complaint of pain this writer went to administer morning meds and Prn, patient then states unable to swallow meds, so they were crushed patient still unable to swallow. Provider notified that patient needed Slp eval and states "he would be better off dead." ?

## 2022-01-07 NOTE — Progress Notes (Signed)
Physical Therapy Treatment ?Patient Details ?Name: [name] ?MRN: [id number] ?DOB: 1933/03/06 ?Today's Date: 01/07/2022 ? ? ?History of Present Illness Pt is an 88 year old male admitted 01/06/22 from SNF with AMS and slurred speech. Of note: recent admission 4/18-4/25 with C6 fx and pubic rami fx nonoperative mgmt. PMH includes: CAD, COPD, CVA, GERD, HTN, hx of CABG, PSA, a fib, hx of prostate cancer, DM, vertigo. ? ?  ?PT Comments  ? ? Pt admitted with above diagnosis. Pt returns to [facility] from SNF. He was able to ambulate 10-15' with therapy before last d/c. Today he needs tot A +2 for mobility and was unable to transfer or ambulate. He presents with AMS and is crying out in pain as therapy entered room. Pt reports several times that he can't wat. Assisted pt to try some mashed banana but he was unable to move it through mouth and eventually spit it out. Coughed with small sip of water. Water and food moved away and RN notified. Recommend return to SNF for post acute PT. PT will follow while hospitalized.  Pt currently with functional limitations due to the deficits listed below (see PT Problem List). Pt will benefit from skilled PT to increase their independence and safety with mobility to allow discharge to the venue listed below.   ?   ?Recommendations for follow up therapy are one component of a multi-disciplinary discharge planning process, led by the attending physician.  Recommendations may be updated based on patient status, additional functional criteria and insurance authorization. ? ?Follow Up Recommendations ? Skilled nursing-short term rehab (<3 hours/day) ?  ?  ?Assistance Recommended at Discharge Frequent or constant Supervision/Assistance  ?Patient can return home with the following Two people to help with walking and/or transfers;Two people to help with bathing/dressing/bathroom;Assistance with cooking/housework;Assistance with feeding;Direct supervision/assist for medications management;Direct  supervision/assist for financial management;Assist for transportation;Help with stairs or ramp for entrance ?  ?Equipment Recommendations ? None recommended by PT  ?  ?Recommendations for Other Services   ? ? ?  ?Precautions / Restrictions Precautions ?Precautions: Fall;Cervical ?Precaution Booklet Issued: No ?Precaution Comments: watch BP ?Required Braces or Orthoses: Cervical Brace ?Cervical Brace: Hard collar;At all times ?Restrictions ?Weight Bearing Restrictions: No ?RLE Weight Bearing: Weight bearing as tolerated ?LLE Weight Bearing: Weight bearing as tolerated  ?  ? ?Mobility ? Bed Mobility ?Overal bed mobility: Needs Assistance ?Bed Mobility: Rolling, Sit to Sidelying, Supine to Sit ?Rolling: Max assist, +2 for physical assistance, +2 for safety/equipment ?  ?  ?  ?Sit to sidelying: Total assist, +2 for physical assistance, +2 for safety/equipment ?General bed mobility comments: Very painful with any bed mobility, yelling out, verbal and physical cues for sequencing, ultimately max to total A +2 for all aspects of bed mobility ?  ? ?Transfers ?  ?  ?  ?  ?  ?  ?  ?  ?  ?General transfer comment: pt unable to tolerate sitting long enough to progress to sit>stand. Pt c/o pain ?  ? ?Ambulation/Gait ?  ?  ?  ?  ?  ?  ?  ?General Gait Details: unable today ? ? ?Stairs ?  ?  ?  ?  ?  ? ? ?Wheelchair Mobility ?  ? ?Modified Rankin (Stroke Patients Only) ?  ? ? ?  ?Balance Overall balance assessment: Needs assistance ?Sitting-balance support: Feet supported ?Sitting balance-Leahy Scale: Poor ?Sitting balance - Comments: Pt with L lateral/posterior lean requiring min to max A throughout session, Pt fatigued sitting EOB ?  Postural control: Left lateral lean, Posterior lean ?  ?  ?  ?  ?  ?  ?  ?  ?  ?  ?  ?  ?  ?  ?  ? ?  ?Cognition Arousal/Alertness: Awake/alert ?Behavior During Therapy: Anxious, Flat affect ?Overall Cognitive Status: Impaired/Different from baseline ?Area of Impairment: Orientation, Memory, Following  commands, Safety/judgement, Awareness, Problem solving ?  ?  ?  ?  ?  ?  ?  ?  ?Orientation Level: Disoriented to, Time, Situation ?Current Attention Level: Sustained ?Memory: Decreased short-term memory ?Following Commands: Follows one step commands with increased time, Follows one step commands inconsistently ?Safety/Judgement: Decreased awareness of safety ?Awareness: Emergent ?Problem Solving: Slow processing, Decreased initiation, Requires verbal cues, Difficulty sequencing ?General Comments: Pt stating he is 87 years old, crying out in pain with any movement, but cooperative and following approx 50% of one step commands with increased time for processing ?  ?  ? ?  ?Exercises   ? ?  ?General Comments General comments (skin integrity, edema, etc.): Pt reports several times that he cannot swallow. Attempted a piece of banana that fell out of his mouth. Banana then mashed up and given to pt but he still could not move it to back of mouth and eventually spit out. Attempted one sip of water but pt coughed excessively with this, no more given. Pt needed assist to remove food from tongue and lips ?  ?  ? ?Pertinent Vitals/Pain Pain Assessment ?Pain Assessment: Faces ?Faces Pain Scale: Hurts worst ?Pain Location: neck and back ?Pain Descriptors / Indicators: Grimacing, Moaning (yelling out) ?Pain Intervention(s): Limited activity within patient's tolerance, Monitored during session, Repositioned  ? ? ?Home Living Family/patient expects to be discharged to:: Skilled nursing facility ?  ?  ?  ?  ?  ?  ?  ?  ?  ?   ?  ?Prior Function    ?  ?  ?   ? ?PT Goals (current goals can now be found in the care plan section) Acute Rehab PT Goals ?Patient Stated Goal: none stated ?PT Goal Formulation: With patient ?Time For Goal Achievement: 01/21/22 ?Potential to Achieve Goals: Fair ? ?  ?Frequency ? ? ? Min 2X/week ? ? ? ?  ?PT Plan    ? ? ?Co-evaluation PT/OT/SLP Co-Evaluation/Treatment: Yes ?Reason for Co-Treatment: Complexity  of the patient's impairments (multi-system involvement);Necessary to address cognition/behavior during functional activity;For patient/therapist safety ?PT goals addressed during session: Mobility/safety with mobility;Balance ?  ?  ? ?  ?AM-PAC PT "6 Clicks" Mobility   ?Outcome Measure ? Help needed turning from your back to your side while in a flat bed without using bedrails?: Total ?Help needed moving from lying on your back to sitting on the side of a flat bed without using bedrails?: Total ?Help needed moving to and from a bed to a chair (including a wheelchair)?: Total ?Help needed standing up from a chair using your arms (e.g., wheelchair or bedside chair)?: Total ?Help needed to walk in hospital room?: Total ?Help needed climbing 3-5 steps with a railing? : Total ?6 Click Score: 6 ? ?  ?End of Session Equipment Utilized During Treatment: Cervical collar ?Activity Tolerance: Patient limited by pain ?Patient left: in bed;with call bell/phone within reach;with bed alarm set ?Nurse Communication: Mobility status;Other (comment) (swallowing issues) ?PT Visit Diagnosis: History of falling (Z91.81);Pain;Muscle weakness (generalized) (M62.81);Unsteadiness on feet (R26.81) ?Pain - part of body:  (back, neck) ?  ? ? ?Time: 1610-9604 ?PT  Time Calculation (min) (ACUTE ONLY): 24 min ? ?Charges:             ?          ? ?Leighton Roach, PT  ?Acute Rehab Services ? Pager [phone] ?Office [phone] ? ? ? ?[place] ?01/07/2022, 1:30 PM ? ?

## 2022-01-07 NOTE — Care Management Obs Status (Signed)
MEDICARE OBSERVATION STATUS NOTIFICATION ? ? ?Patient Details  ?Name: [name] ?MRN: [id number] ?Date of Birth: 01-May-1933 ? ? ?Medicare Observation Status Notification Given:  Yes ? ? ? ?Angelita Ingles, RN ?01/07/2022, 4:14 PM ?
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
## 2022-01-07 NOTE — Progress Notes (Signed)
Patient continued to have difficulty swallowing.  He coughed and had trouble swallowing pills. ?Discussed with patient's family, daughter [name] over the phone. ? ?Plan: NPO.  Repeat evaluation by speech once he can come off cervical collar to soft collar.  Maintenance IV fluids tonight. ?Will need safety evaluation, safe intake before making discharge plan.  Family agrees. ?

## 2022-01-07 NOTE — Progress Notes (Signed)
Cervical flex/ext films reviewed, no abnormal motion at the site of known C6 fracture. With significant morbidity associated with use of hard collar and >4weeks after injury with reassuring dynamic cervical x-rays, I think we can discontinue hard collar and switch to soft cervical collar. Can f/u in outpatient clinic after discharge. ? ? ?Consuella Lose, MD ?[facility]  ?

## 2022-01-07 NOTE — Progress Notes (Signed)
Initial Nutrition Assessment ? ?DOCUMENTATION CODES:  ? ?Severe malnutrition in context of chronic illness ? ?INTERVENTION:  ? ?Given severe malnutrition, significant recent weight loss, and SLP recommendation for NPO, recommend Cortrak placement and initiation of enteral nutrition if within pt's GOC. Recommend: ?- Start Osmolite 1.5 @ 20 ml/hr and advance by 10 ml q 8 hours to goal rate of 50 ml/hr (1200 ml/day) ?- ProSource TF 45 ml BID ? ?Recommended tube feeding regimen at goal rate would provide 1880 kcal, 97 grams of protein, and 914 ml of H2O. Pt is at refeeding risk. ? ?NUTRITION DIAGNOSIS:  ? ?Severe Malnutrition related to chronic illness (CHF, COPD, dysphagia) as evidenced by severe fat depletion, severe muscle depletion, percent weight loss (14.1% weight loss in less than 7 months). ? ?GOAL:  ? ?Patient will meet greater than or equal to 90% of their needs ? ?MONITOR:  ? ?Diet advancement, Labs, Weight trends, I & O's ? ?REASON FOR ASSESSMENT:  ? ?Consult ?Assessment of nutrition requirement/status, Poor PO ? ?ASSESSMENT:  ? ?88 year old male who presented to the ED on 5/15 with AMS. PMH of PAF, CHF, CAD s/p CABG, Mobitz 2 second-degree AV block s/p PPM, COPD, T2DM, HTN, HLD, thrombocytopenia, recurrent UTIs, OSA. Pt admitted with acute encephalopathy, AKI, C6 cervical fx. ? ?Spoke with pt briefly at bedside. Pt sleeping at time of RD visit but awoke to RD voice. Pt reports that he can't eat because he can't swallow. Pt reports that this has been going on for a while which is why he has lost weight. Pt reports that he is "starving to death." Pt unable to provide more details regarding PO intake PTA. Per review of notes, family reports pt with poor PO intake for several weeks. ? ?Pt unsure of UBW and how much weight he has lost. Reviewed weight history in chart. Pt with a steady decline in weight over the last year. Pt with a 12.7 kg weight loss since 07/01/22. This is a 14.1% weight loss in less than 7  months which is severe and significant for timeframe. Based on weight loss and NFPE, pt meets criteria for severe chronic malnutrition. ? ?Per SLP note today, recommendation is for NPO as SLP is unable to make a safe diet recommendation at this time. ? ?Given severe malnutrition, significant recent weight loss, and SLP recommendation for NPO, recommend Cortrak placement and initiation of enteral nutrition if within pt's GOC. RD to leave tube feeding recommendations. Noted Palliative consult is pending. ? ?Meal Completion: 10% x 1 documented meal (Carb Modified) ? ?Medications reviewed and include: colace, SSI, remeron, protonix ? ?Labs reviewed: BUN 33, creatinine 1.60, ionized calcium 1.02, platelets 117, hemoglobin A1C 5.2 ?CBG's: 100-107 ? ?UOP: 530 ml x 12 hours ? ?NUTRITION - FOCUSED PHYSICAL EXAM: ? ?Flowsheet Row Most Recent Value  ?Orbital Region Severe depletion  ?Upper Arm Region Severe depletion  ?Thoracic and Lumbar Region Moderate depletion  ?Buccal Region Moderate depletion  ?Temple Region Moderate depletion  ?Clavicle Bone Region Severe depletion  ?Clavicle and Acromion Bone Region Severe depletion  ?Scapular Bone Region Moderate depletion  ?Dorsal Hand Moderate depletion  ?Patellar Region Moderate depletion  ?Anterior Thigh Region Severe depletion  ?Posterior Calf Region Severe depletion  ?Edema (RD Assessment) None  ?Hair Reviewed  ?Eyes Reviewed  ?Mouth Reviewed  ?Skin Reviewed  ?Nails Reviewed  ? ?  ? ? ?Diet Order:   ?Diet Order   ? ?       ?  Diet Carb Modified Fluid consistency:  Thin; Room service appropriate? Yes  Diet effective now       ?  ? ?  ?  ? ?  ? ? ?EDUCATION NEEDS:  ? ?Not appropriate for education at this time ? ?Skin:  Skin Assessment: Reviewed RN Assessment ? ?Last BM:  no documented BM ? ?Height:  ? ?Ht Readings from Last 1 Encounters:  ?12/11/21 '5\' 11"'$  (1.803 m)  ? ? ?Weight:  ? ?Wt Readings from Last 1 Encounters:  ?01/07/22 77.1 kg  ? ? ?BMI:  Body mass index is 23.71  kg/m?. ? ?Estimated Nutritional Needs:  ? ?Kcal:  1800-2000 ? ?Protein:  95-115 grams ? ?Fluid:  1.8 L/day ? ? ? ?Gustavus Bryant, MS, RD, LDN ?Inpatient Clinical Dietitian ?Please see AMiON for contact information. ? ?

## 2022-01-07 NOTE — Evaluation (Signed)
Occupational Therapy Evaluation Patient Details Name: [name] MRN: [id number] DOB: 1933-06-20 Today's Date: 01/07/2022   History of Present Illness Pt is an 88 year old male admitted 01/06/22 from SNF with AMS and slurred speech. Of note: recent admission 4/18-4/25 with C6 fx and pubic rami fx nonoperative mgmt. PMH includes: CAD, COPD, CVA, GERD, HTN, hx of CABG, PSA, a fib, hx of prostate cancer, DM, vertigo.   Clinical Impression   Pt from SNF, when he left [facility] he was walking short distances with RW and participating in ADL with mod A for LB. Prior to that he was independent in ADL and mobility with RW. Today he is max A to total A for all aspects of ADL/mobility is +2 assist. He is confused, but following 50% of commands with increased time for processing. He is in significant pain - crying out with any movement. Therapy team adjusted his C Collar in supine prior to sitting EOB. At EOB he is unable to grasp objects in either hand, and unable to eat food - even food that has been mashed, and he coughed immediately after sipping water. DId not attempt standing at this time. OT will continue to follow acutely. At this time requesting SLP, and Palliative consult. Pt will require SNF post-acuet but OT will follow acutely to maximize safety and independence in ADL and functional transfers.       Recommendations for follow up therapy are one component of a multi-disciplinary discharge planning process, led by the attending physician.  Recommendations may be updated based on patient status, additional functional criteria and insurance authorization.   Follow Up Recommendations  Skilled nursing-short term rehab (<3 hours/day)    Assistance Recommended at Discharge Frequent or constant Supervision/Assistance  Patient can return home with the following Two people to help with walking and/or transfers;A lot of help with bathing/dressing/bathroom;Direct supervision/assist for medications  management;Direct supervision/assist for financial management;Assistance with feeding;Assist for transportation;Help with stairs or ramp for entrance    Functional Status Assessment  Patient has had a recent decline in their functional status and demonstrates the ability to make significant improvements in function in a reasonable and predictable amount of time.  Equipment Recommendations  Wheelchair (measurements OT);Wheelchair cushion (measurements OT)    Recommendations for Other Services PT consult;Speech consult;Other (comment) (Palliative Consult)     Precautions / Restrictions Precautions Precautions: Fall;Cervical Precaution Booklet Issued: No Required Braces or Orthoses: Cervical Brace Cervical Brace: Hard collar;At all times Restrictions Weight Bearing Restrictions: No      Mobility Bed Mobility Overal bed mobility: Needs Assistance Bed Mobility: Rolling, Sit to Sidelying, Supine to Sit Rolling: Max assist, +2 for physical assistance, +2 for safety/equipment     Sit to supine: Max assist, +2 for physical assistance, +2 for safety/equipment, HOB elevated Sit to sidelying: Total assist, +2 for physical assistance, +2 for safety/equipment General bed mobility comments: Very painful with any bed mobility, yelling out, verbal and physical cues for sequencing, ultimately max to total A +2 for all aspects of bed mobility    Transfers                   General transfer comment: NT this session - Pt unable      Balance Overall balance assessment: Needs assistance Sitting-balance support: Feet supported Sitting balance-Leahy Scale: Poor Sitting balance - Comments: Pt with L lateral/posterior lean requiring min to max A throughout session, Pt fatigued sitting EOB Postural control: Left lateral lean, Posterior lean  ADL either performed or assessed with clinical judgement   ADL Overall ADL's : Needs  assistance/impaired Eating/Feeding: Maximal assistance;Sitting Eating/Feeding Details (indicate cue type and reason): Pt unable to use lips/tongue to get food into mouth for mastication. unable to hold onto utensils or bits of food and bring to mouth Grooming: Maximal assistance;Bed level   Upper Body Bathing: Total assistance   Lower Body Bathing: Total assistance   Upper Body Dressing : Total assistance   Lower Body Dressing: Total assistance   Toilet Transfer: Total assistance Toilet Transfer Details (indicate cue type and reason): NT this session Toileting- Clothing Manipulation and Hygiene: Total assistance       Functional mobility during ADLs:  (NT this session) General ADL Comments: Pt with limited cognition, decreased balance, very painful, BUE very weak and with decreased ROM     Vision Baseline Vision/History: 1 Wears glasses Ability to See in Adequate Light: 1 Impaired Patient Visual Report: Blurring of vision Vision Assessment?: Vision impaired- to be further tested in functional context Additional Comments: reports blurrier than normal - even with glasses on and clean     Perception     Praxis      Pertinent Vitals/Pain Pain Assessment Pain Assessment: Faces Faces Pain Scale: Hurts worst Pain Location: neck and back Pain Descriptors / Indicators: Grimacing, Moaning, Other (Comment) (yelling out) Pain Intervention(s): Monitored during session, Limited activity within patient's tolerance, Repositioned     Hand Dominance Right   Extremity/Trunk Assessment Upper Extremity Assessment Upper Extremity Assessment: RUE deficits/detail;LUE deficits/detail RUE Deficits / Details: grasp 4/5 (unable to hold utensils or food to bring to mouth), decreased shoulder flexion, pain with all movement RUE Coordination: decreased fine motor;decreased gross motor LUE Deficits / Details: grasp 4/5 (unable to hold utensils or food to bring to mouth), decreased shoulder flexion,  pain with all movement LUE Coordination: decreased fine motor;decreased gross motor   Lower Extremity Assessment Lower Extremity Assessment: Defer to PT evaluation   Cervical / Trunk Assessment Cervical / Trunk Assessment: Kyphotic;Other exceptions Cervical / Trunk Exceptions: c6 fx, non-op managed in C Collar   Communication Communication Communication: HOH;Other (comment) (mumbling and hard to understand at times)   Cognition Arousal/Alertness: Awake/alert Behavior During Therapy: Anxious, Flat affect Overall Cognitive Status: Impaired/Different from baseline Area of Impairment: Orientation, Memory, Following commands, Safety/judgement, Awareness, Problem solving                 Orientation Level: Disoriented to, Time, Situation Current Attention Level: Sustained Memory: Decreased short-term memory Following Commands: Follows one step commands with increased time, Follows one step commands inconsistently Safety/Judgement: Decreased awareness of safety Awareness: Emergent Problem Solving: Slow processing, Decreased initiation, Requires verbal cues, Difficulty sequencing General Comments: Pt stating he is 88 years old, crying out in pain with any movement, but cooperative and following approx 50% of one step commands with increased time for processing     General Comments       Exercises     Shoulder Instructions      Home Living Family/patient expects to be discharged to:: Skilled nursing facility                                        Prior Functioning/Environment Prior Level of Function : Needs assist             Mobility Comments: was walking 12 ft when he last left [facility], prior  to hospitalization 4/18-25 was mod I with RW/SPC ADLs Comments: prior to initial hospitalization he was independent in ADL and wife did IADL        OT Problem List: Decreased strength;Decreased activity tolerance;Impaired balance (sitting and/or  standing);Decreased safety awareness;Decreased knowledge of precautions;Decreased knowledge of use of DME or AE;Cardiopulmonary status limiting activity;Pain;Impaired UE functional use      OT Treatment/Interventions: Self-care/ADL training;Therapeutic exercise;Energy conservation;DME and/or AE instruction;Therapeutic activities;Patient/family education;Balance training    OT Goals(Current goals can be found in the care plan section) Acute Rehab OT Goals Patient Stated Goal: stop hurting OT Goal Formulation: With patient Time For Goal Achievement: 01/21/22 Potential to Achieve Goals: Fair ADL Goals Pt Will Perform Eating: with modified independence;with adaptive utensils;sitting Pt Will Perform Grooming: with set-up;with adaptive equipment;sitting Pt Will Perform Upper Body Dressing: with min assist;sitting Pt Will Perform Lower Body Dressing: with mod assist;sitting/lateral leans;with adaptive equipment Pt Will Transfer to Toilet: with mod assist;stand pivot transfer;bedside commode Pt Will Perform Toileting - Clothing Manipulation and hygiene: with max assist;sit to/from stand Pt/caregiver will Perform Home Exercise Program: Right Upper extremity;Left upper extremity;With minimal assist;With written HEP provided;Increased ROM;Increased strength Additional ADL Goal #1: Pt will perform bed mobility and maintain sitting EOB at min A as precursor to participate in ADL  OT Frequency: Min 2X/week    Co-evaluation              AM-PAC OT "6 Clicks" Daily Activity     Outcome Measure Help from another person eating meals?: Total Help from another person taking care of personal grooming?: Total Help from another person toileting, which includes using toliet, bedpan, or urinal?: Total Help from another person bathing (including washing, rinsing, drying)?: A Lot Help from another person to put on and taking off regular upper body clothing?: Total Help from another person to put on and taking  off regular lower body clothing?: Total 6 Click Score: 7   End of Session Equipment Utilized During Treatment: Cervical collar Nurse Communication: Mobility status;Need for lift equipment;Precautions;Other (comment) (coughing after water, inability to feed himself, needs oral suction and hygeine)  Activity Tolerance: Patient limited by pain Patient left: in bed;with call bell/phone within reach;with bed alarm set  OT Visit Diagnosis: Unsteadiness on feet (R26.81);Other abnormalities of gait and mobility (R26.89);Muscle weakness (generalized) (M62.81);History of falling (Z91.81);Feeding difficulties (R63.3);Other symptoms and signs involving cognitive function;Adult, failure to thrive (R62.7);Pain Pain - Right/Left: Left Pain - part of body: Shoulder;Hip                Time: 9147-8295 OT Time Calculation (min): 24 min Charges:  OT General Charges $OT Visit: 1 Visit OT Evaluation $OT Eval Moderate Complexity: 1 Mod  Nyoka Cowden OTR/L Acute Rehabilitation Services Pager: [phone] Office: [phone]  Evern Bio Tong Pieczynski 01/07/2022, 12:52 PM

## 2022-01-07 NOTE — Progress Notes (Signed)
?PROGRESS NOTE ? ? ? ?[name]  TXM:[id number] DOB: [birth year] DOA: 01/06/2022 ?PCP: Calvin Norlander, DO  ? ? ?Brief Narrative:  ?88 year old gentleman with history of paroxysmal A-fib previously on Eliquis, chronic systolic heart failure, coronary artery disease status post CABG, sick sinus syndrome status post permanent pacemaker, COPD, type 2 diabetes, hypertension, hyperlipidemia, chronic thrombocytopenia, recurrent UTIs, untreated sleep apnea brought to the ER with altered mental status.  Recently admitted to hospital with polytrauma 4/18-4/25 with C6 spinal fracture, multiple pelvic ring fracture and discharged to SNF.  Discharged with hard collar.  Patient continued to have poor participation, poor intake.  Patient was planning to discharge home from rehab yesterday, however he was noted to be gargled speech and impulsive.  Gargled speech was noted for some time with poor oral hygiene.  Recently treated for UTI, antibiotic unknown. ?In the emergency room hemodynamically stable.  94% on room air.  Creatinine 1.66 at about baseline.  CT head with no acute findings.  Chest x-ray with stable cardiomegaly.  Given 1 L fluid bolus.  Admitted for symptomatic treatment. ? ? ?Assessment & Plan: ?  ?Acute metabolic encephalopathy in a patient with recent trauma, multiple medical issues, pain and discomfort and distress. ?Infection work-up negative.  Urine analysis normal, culture pending.  Chest x-ray with no evidence of infection.  CT scan with no evidence of new injury, hematoma. ?His confusion is likely related to distress, discomfort from hard cervical collar, position and pain. ?Mental status is normalizing. ?Possibility of suffering from TIA, however with no neurological motor or sensory deficits. ?CT head is unrevealing, discussed with family that patient has very uncomfortable spine and MRI may not give any new information if mental status is already improving.  We will discontinue MRI. ?Optimize his  treatment, resume Eliquis.  More than 4 weeks from pelvic trauma.  There was no intracranial injury. ?Speech, earlier uncooperative with speech therapy with gagging and spitting, monitored at the bedside with daughter.  He could swallow normally.  Aggressive mouth care.  Nystatin swish and swallow.  Allow regular diet.  Speech to continue follow-up. ?PT/OT: Currently with limitation of activities.  Recommended SNF.  Family gathering support at home and they would like to take him home with home health PT OT.  Given his unsatisfactory results at a SNF, reasonable plan to go home with 24/7 care. ? ?Recent polytrauma ?C6 cervical fracture, on and off on [place] collar.  Probably stable fracture.  I called and discussed with Dr. Kathyrn Sheriff, will order cervical spine x-ray flexion-extension and possibly he can go on soft collar.  This will help him reduce his stress, reduce pain and discomfort, also will help with mouth care and swallowing function. ?Pelvic ring fracture with pelvic hematoma: More than 4 weeks since injury.  Eliquis was discontinued since trauma.  No evidence of ongoing bleeding. ?CHADS2 vascular score is 8, 11% risk of having a stroke.  Benefits outweigh risk.  Going with supervised living condition. ?Discussed in detail with the patient and family about risk of bleeding and prevention from disabling stroke.  Will resume Eliquis. ?Pain relief with Tylenol 650 mg every 6 hours scheduled for next 24 hours. ?Oxycodone 5 mg every 4 hours as needed to help with pain control, mobility.  Along with laxatives. ? ?Chronic systolic congestive heart failure: Dry on presentation.  Euvolemic today.  Discontinue further IV fluids. ?Continue to hold Lasix, Entresto, Aldactone and beta-blockers.  Blood pressures are adequate and he is euvolemic.  Will not tolerate  any heart failure therapy at this time. ? ?Type 2 diabetes, uncontrolled without long-term use of insulin: Patient on metformin at home.  Can resume on  discharge.  Currently remains on sliding scale insulin. ? ?Sick sinus syndrome: Pacemaker. ? ?Obstructive sleep apnea: Intolerant to CPAP. ? ?Chronic thrombocytopenia: Platelets 117.  At about baseline. ? ?CKD stage IIIb: At about baseline.  Creatinine 1.4-1.6.  Monitor closely. ? ?Nutrition Status: ?Nutrition Problem: Severe Malnutrition ?Etiology: chronic illness (CHF, COPD, dysphagia) ?Signs/Symptoms: severe fat depletion, severe muscle depletion, percent weight loss (14.1% weight loss in less than 7 months) ?Percent weight loss: 14.1 % ?Interventions: Refer to RD note for recommendations ?RD recommended tube feeding, will recommend against tube feeding.  Will monitor for diet tolerance, if unable will have goal of care discussion.  He will probably eat well if we can take out his hard collar and put him on soft cervical collar. ? ? ?Goal of care: ?Patient with ongoing issues of pain and discomfort, agitation and inadequate rehab.  He will benefit with palliative care consultation, goal of care, symptom management and coordination of care in the hospital and at home. ?Family agreed to meet with palliative care. ? ? ? ?DVT prophylaxis: SCD's Start: 01/06/22 1907 ?apixaban (ELIQUIS) tablet 5 mg  ? ?Code Status: Full code ?Family Communication: Daughter [name] at the bedside, wife at the bedside ?Disposition Plan: Status is: Observation ?The patient will require care spanning > 2 midnights and should be moved to inpatient because: Unsafe discharge disposition plan. ?  ? ? ?Consultants:  ?Neurosurgery, phone consultation. ? ?Procedures:  ?None ? ?Antimicrobials:  ?None ? ? ?Subjective: ?Patient seen and examined.  Early morning rounds, I went to examine him and he was quite unhappy about his cervical collar, aware of his living.  He was telling me that he will be left to die in his bed. ? ?Patient's daughter arrived, went to examine patient again.  He was more alert awake.  Daughter did some mouth care and his speech  was clear.  Patient was asking me whether he annoyed me.  He tells me that he just uncomfortable.  He was able to swallow regular food given under my supervision by his daughter.  The hard collar really makes him uncomfortable.  We discussed about discharge disposition, unlikely more information with MRI so not to subject him to unnecessary pain and discomfort.  Family agreed. ? ?Called and discussed case with neurosurgery. ? ?Objective: ?Vitals:  ? 01/07/22 0332 01/07/22 0334 01/07/22 0724 01/07/22 0905  ?BP: 122/73  110/63   ?Pulse: 76  77   ?Resp: 16  15   ?Temp: 98.5 ?F (36.9 ?C)  98 ?F (36.7 ?C)   ?TempSrc: Oral     ?SpO2: 97%  99% 100%  ?Weight:  77.1 kg    ? ? ?Intake/Output Summary (Last 24 hours) at 01/07/2022 1337 ?Last data filed at 01/07/2022 0753 ?Gross per 24 hour  ?Intake 1060 ml  ?Output 530 ml  ?Net 530 ml  ? ?Filed Weights  ? 01/07/22 0334  ?Weight: 77.1 kg  ? ? ?Examination: ? ?General exam: Appears mildly anxious.  Somewhat uncomfortable with cervical collar on his neck. ?Alert and awake.  Interactive.  Occasionally annoyed otherwise mostly pleasant. ?Respiratory system: Clear to auscultation. Respiratory effort normal.  No added sounds. ?Cardiovascular system: S1 & S2 heard, RRR. No JVD, murmurs, rubs, gallops or clicks. No pedal edema.  Pacemaker in place. ?Gastrointestinal system: Abdomen is nondistended, soft and nontender. No organomegaly or masses  felt. Normal bowel sounds heard. ?Central nervous system: Alert and oriented. No focal neurological deficits.  Can move all extremities. ?Extremities: Symmetric 5 x 5 power. ?Skin: No rashes, lesions or ulcers ?Psychiatry: Judgement and insight appear normal.  Flat affect and anxious mood. ? ? ? ?Data Reviewed: I have personally reviewed following labs and imaging studies ? ?CBC: ?Recent Labs  ?Lab 01/06/22 ?1247 01/06/22 ?1315  ?WBC 8.5  --   ?NEUTROABS 6.4  --   ?HGB 11.1* 11.2*  ?HCT 38.1* 33.0*  ?MCV 108.5*  --   ?PLT 117*  --   ? ?Basic  Metabolic Panel: ?Recent Labs  ?Lab 01/06/22 ?1247 01/06/22 ?1315  ?NA 144 141  ?K 3.6 3.5  ?CL 112* 112*  ?CO2 20*  --   ?GLUCOSE 141* 138*  ?BUN 32* 33*  ?CREATININE 1.66* 1.60*  ?CALCIUM 9.0  --   ? ?GFR: ?Es

## 2022-01-08 DIAGNOSIS — R638 Other symptoms and signs concerning food and fluid intake: Secondary | ICD-10-CM | POA: Diagnosis not present

## 2022-01-08 DIAGNOSIS — S12501S Unspecified nondisplaced fracture of sixth cervical vertebra, sequela: Secondary | ICD-10-CM

## 2022-01-08 DIAGNOSIS — R1310 Dysphagia, unspecified: Secondary | ICD-10-CM | POA: Diagnosis not present

## 2022-01-08 DIAGNOSIS — R531 Weakness: Secondary | ICD-10-CM | POA: Diagnosis not present

## 2022-01-08 DIAGNOSIS — Z7189 Other specified counseling: Secondary | ICD-10-CM | POA: Diagnosis not present

## 2022-01-08 DIAGNOSIS — G9340 Encephalopathy, unspecified: Secondary | ICD-10-CM | POA: Diagnosis not present

## 2022-01-08 DIAGNOSIS — Z515 Encounter for palliative care: Secondary | ICD-10-CM | POA: Diagnosis not present

## 2022-01-08 LAB — GLUCOSE, CAPILLARY
Glucose-Capillary: 78 mg/dL (ref 70–99)
Glucose-Capillary: 86 mg/dL (ref 70–99)
Glucose-Capillary: 77 mg/dL (ref 70–99)
Glucose-Capillary: 66 mg/dL — ABNORMAL LOW (ref 70–99)
Glucose-Capillary: 70 mg/dL (ref 70–99)
Glucose-Capillary: 72 mg/dL (ref 70–99)

## 2022-01-08 NOTE — Progress Notes (Signed)
Speech Language Pathology Treatment: Dysphagia  ?Patient Details ?Name: [name] ?MRN: [id number] ?DOB: Feb 04, 1933 ?Today's Date: 01/08/2022 ?Time: 1355-1404 ?SLP Time Calculation (min) (ACUTE ONLY): 9 min ? ?Assessment / Plan / Recommendation ?Clinical Impression ? Pt was seen for dysphagia treatment. He was alert and moderately cooperative during the session. Pt stated that he inconsistently coughs during p.o. intake. Pt tolerated puree, dysphagia 2, regular texture solids, and up to two consecutive swallows of thin liquids via straw without overt s/sx of aspiration. Mastication was prolonged with regular texture solids and pt reported some difficulty with "harder" foods. Oral clearance was adequate with pt's request for a liquid wash "to get some of this stuff out" after intake of regular texture solids. A dysphagia 2 diet with thin liquids is recommended with observance of swallowing precautions. SLP will continue to follow pt to  ensure tolerance and to determine need for instrumental assessment.  ?  ?HPI HPI: [name] is a 88 y.o. male who presented with AMS and slurred speech.  CXR and Head CT 5/15 negative for acute findings.  Pt recently admitted for fall 4/18-4/25 with C5 and ramus fractures and d/c'd to SNF.  He has had poor PO intake since being at the SNF. On admission, he was alert, but not oriented which is not his baseline. Baseline A&O x 4. Pt recently treated for UTI.  PMH: PAF on Eliquis, HFrEF (EF 35% by TTE 04/25/2020), CAD s/p CABG, Mobitz 2 second-degree AV block s/p PPM, COPD, T2DM, HTN, HLD, thrombocytopenia, recurrent UTIs, OSA not tolerating CPAP. ?  ?   ?SLP Plan ? Continue with current plan of care ? ?  ?  ?Recommendations for follow up therapy are one component of a multi-disciplinary discharge planning process, led by the attending physician.  Recommendations may be updated based on patient status, additional functional criteria and insurance authorization. ?  ? ?Recommendations   ?Diet recommendations: Dysphagia 2 (fine chop);Thin liquid ?Liquids provided via: Cup;Straw ?Medication Administration: Whole meds with puree (or crushed; as tolerated) ?Supervision: Staff to assist with self feeding ?Compensations: Slow rate;Small sips/bites ?Postural Changes and/or Swallow Maneuvers: Seated upright 90 degrees  ?   ?    ?   ? ? ? ? Oral Care Recommendations: Oral care QID ?Follow Up Recommendations: Skilled nursing-short term rehab (<3 hours/day) ?Assistance recommended at discharge: Frequent or constant Supervision/Assistance ?SLP Visit Diagnosis: Dysphagia, unspecified (R13.10) ?Plan: Continue with current plan of care ? ? ? ? ?  ?  ? ?[name] I. Hardin Negus, [place], CCC-SLP ?Acute Rehabilitation Services ?Office number [phone] ?Pager [phone] ? ?[name] ? ?01/08/2022, 2:34 PM ? ? ?

## 2022-01-08 NOTE — Progress Notes (Addendum)
Upon entering pt room this RN noted pt mitts off and pt removed IV to R hand. Mitts replaced. IV consult ordered for PIV. Pt refusing morning medication swatting medications out of this nurses hands. Attempted to education pt on importance of meds pt continues to refuse. MD notified new orders at this time.  ?

## 2022-01-08 NOTE — Progress Notes (Signed)
Patient ID: [name], male   DOB: [birth year], 88 y.o.   MRN: [id number]    Progress Note from the Palliative Medicine Team at [facility]   Patient Name: [name]        Date: 01/08/2022 DOB: [birth year]  Age: 88 y.o. MRN#: [id number] Attending Physician: Shelly Coss, MD Primary Care Physician: [name], DO Admit Date: 01/06/2022   Medical records reviewed   88 y.o. male   admitted on 01/06/2022 with  past medical history significant of PAF on Eliquis, HFrEF (EF 35% by TTE 04/25/2020), CAD s/p CABG, Mobitz 2 second-degree AV block s/p PPM, COPD, T2DM, HTN, HLD, thrombocytopenia, recurrent UTIs, OSA not tolerating CPAP,  AMS    He was going to be discharged home from the SNF after rehab but was altered and had slurred speech. He fell 3 weeks ago and has 3 fractures in his ramus and a cervical fracture. Admitted to trauma service at [facility] from 4/18-4/25. Discharged to SNF for rehab.    His daughter states that his speech has been gargled for a few days-weeks. He has had poor PO intake since being at the SNF. He was alert, but not oriented which is not his baseline. He would also say some things that didn't make sense. They called his daughter who is an ICU nurse and advised they call EMS.    Baseline A&O x 4. Recently treated for UTI and finished abx yesterday.   This NP visited patient at the bedside as a follow up to  yesterday's [place].  I spoke to daughter/ [name] by phone.   Continued conversation regarding current medical situation and my concern that this may be a new baseline.  Report to remember that patient was ambulatory and independent in April according to family.     Plan of Care: -Full Code     - Educated family to consider DNR/DNI status understanding evidenced based poor outcomes in similar hospitalized patient, as the cause of arrest is likely associated with advanced chronic illness rather than an easily reversible acute cardio-pulmonary event.   -Family is open to all offered and available medical interventions to prolong,Life treat the treatable and hope for improvement and ultimately return home.    -Family is interested in evaluation for inpatient rehab  Education offered on outpatient community-based palliative services.  Education offered today regarding  the importance of continued conversation with family and the medical providers regarding overall plan of care and treatment options,  ensuring decisions are within the context of the patients values and GOCs.  Questions and concerns addressed   Discussed with Dr Donia Pounds NP  Palliative Medicine Team Team Phone # [phone] Pager [phone]

## 2022-01-08 NOTE — Progress Notes (Signed)
Pt pulled IV out and will not leave neck brace on. Safety mitts placed on patient.  ?
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
## 2022-01-08 NOTE — Progress Notes (Signed)
Orthopedic Tech Progress Note ?Patient Details:  ?MYKING SAR ?1932-12-13 ?[id number] ? ?Ortho Devices ?Type of Ortho Device: Soft collar ?Ortho Device/Splint Location: NECK ?Ortho Device/Splint Interventions: Ordered, Application ?  ?Post Interventions ?Patient Tolerated: Well ?Instructions Provided: Care of device ? ?Janit Pagan ?01/08/2022, 10:22 AM ? ?

## 2022-01-08 NOTE — Progress Notes (Signed)
?PROGRESS NOTE ? ?[name]  WNU:[id number] DOB: [birth year] DOA: 01/06/2022 ?PCP: [name], DO  ? ?Brief Narrative: ?Patient is a 88 year old male with history of paroxysmal A-fib, chronic systolic congestive heart failure, coronary artery disease status post CABG, sick sinus syndrome status post permanent pacemaker placement, COPD, diabetes type 2, hypertension, hyperlipidemia, chronic thrombocytopenia, recurrent UTI, sleep apnea not on CPAP who presented with altered mental status from home.  He was recently admitted here with polytrauma in April and was found to have C6 spinal fracture, multiple pelvic ring fracture and was discharged to  SNF, was discharged with hard collar.  He was recently treated for UTI.  At the SNF, patient continued to have poor oral intake, noted to have garbled speech and confused.  On presentation, CT head did not show any acute findings.  Patient was admitted for further evaluation.  Neurosurgery was consulted.  Waiting for speech therapy today. ? ? ?Assessment & Plan: ? ?Principal Problem: ?  Acute encephalopathy ?Active Problems: ?  Dysarthria ?  Acute kidney injury superimposed on chronic kidney disease ([place]) ?  C6 cervical fracture ([facility]) ?  History of pelvic hematoma ?  Paroxysmal atrial fibrillation ([facility]) ?  Closed fracture of multiple pubic rami, right, sequela ?  HFrEF (heart failure with reduced ejection fraction) ([place]) ?  Controlled type 2 diabetes mellitus without complication, without long-term current use of insulin ([place]) ?  COPD (chronic obstructive pulmonary disease) ([place]) ?  S/P CABG x 3 ?  Second degree Mobitz II AV block ?  Hyperlipidemia with target LDL less than 100 ?  OSA (obstructive sleep apnea) ?  Thrombocytopenia ([place]) ?  Protein-calorie malnutrition, severe ? ?Acute metabolic encephalopathy: History of recent trauma, UTI which was treated with antibiotics.  Infection work-up has been negative.  Urinalysis not reassuring.  Culture pending.  Chest  x-ray did not show pneumonia.  CT head did not show any acute intracranial abnormalities.  MRI not ordered with low suspicion of stroke.  No neurological deficits.  His mental status has definitely improved, currently he is alert and oriented. Monitor mental status.  Delirium precautions. ? ?Dysphagia: Patient history of polytrauma with C6 fracture.  Wearing hard collar.  Dysphagia suspected to be from hard collar.  Hard collar will be  changed to soft collar.  Speech therapy reconsulted.  Currently NPO. ? ?Patient history of polytrauma: Found to have C6 fracture on last admission.  Currently on soft collar.  Neurosurgery recommended outpatient follow-up.  Also found to have pelvic ring fracture with pelvic hematoma last admission.  Was residing in a nursing facility but was planned for discharge to home.  Continue pain management, supportive care.  PT/OT recommended skilled nursing facility on discharge but patient wants to go home with home health. TOC following ? ?Paroxysmal A-fib: Currently in normal sinus rhythm.  Eliquis was discontinued on last admission after polytrauma but has been resumed now.  Also on amiodarone. ? ?Chronic systolic congestive heart failure: Euvolemic.  Takes Lasix, Entresto, Aldactone, beta-blockers at home: These are on hold.  Will consider resuming on discharge.  Currently on gentle IV fluids because he is n.p.o. last echo on 04/2020 showed EF of 35%. ? ?Diabetes type 2: On metformin at home.  Currently on sliding scale insulin.  Monitor blood sugars ? ?Hyperlipidemia: Takes crestor ? ?History of depression: On Remeron, Effexor ? ?Sick sinus syndrome: Status post pacemaker placement ? ?OSA: Intolerant to CPAP ? ?Thrombocytopenia: Chronic.  Stable ? ?CKD stage IIIb: Baseline creatinine 1.4-1.6.  Kidney function  currently at baseline ? ? ? ?Nutrition Problem: Severe Malnutrition ?Etiology: chronic illness (CHF, COPD, dysphagia) ?  ? ?DVT prophylaxis:SCD's Start: 01/06/22 1907 ?apixaban  (ELIQUIS) tablet 5 mg  ? ?  Code Status: Full Code ? ?Family Communication: Called and discussed with daughter [name] on 5/17 ? ?Patient status:Obs ? ?Patient is from :Home ? ?Anticipated discharge MP:NTIR vs SnF ? ?Estimated DC date:tomorrow ? ? ?Consultants: None ? ?Procedures:None ? ?Antimicrobials:  ?Anti-infectives (From admission, onward)  ? ? None  ? ?  ? ? ?Subjective: ? ?Patient seen and examined at the bedside this morning.  Hemodynamically stable.  Looks comfortable, lying on bed.  His mental status has improved.  He knew that he is in the hospital and he knew the current month.  He was in mittens on both hands which I removed during my evaluation.  He was still on hard collar. ? ?Objective: ?Vitals:  ? 01/07/22 2325 01/08/22 0313 01/08/22 0500 01/08/22 0739  ?BP: (!) 123/50 113/69    ?Pulse: 71 67  72  ?Resp:  19  16  ?Temp: 98 ?F (36.7 ?C) 98.6 ?F (37 ?C)  97.7 ?F (36.5 ?C)  ?TempSrc: Oral Oral  Oral  ?SpO2: 95% 97%  97%  ?Weight:   77.2 kg   ? ? ?Intake/Output Summary (Last 24 hours) at 01/08/2022 0802 ?Last data filed at 01/07/2022 1300 ?Gross per 24 hour  ?Intake 120 ml  ?Output --  ?Net 120 ml  ? ?Filed Weights  ? 01/07/22 0334 01/08/22 0500  ?Weight: 77.1 kg 77.2 kg  ? ? ?Examination: ? ?General exam: Overall comfortable, not in distress, elderly deconditioned male ?HEENT: PERRL, hard collar ?Respiratory system:  no wheezes or crackles  ?Cardiovascular system: S1 & S2 heard, RRR.  ?Gastrointestinal system: Abdomen is nondistended, soft and nontender. ?Central nervous system: Alert and oriented ?Extremities: No edema, no clubbing ,no cyanosis ?Skin: No rashes, no ulcers,no icterus   ? ? ?Data Reviewed: I have personally reviewed following labs and imaging studies ? ?CBC: ?Recent Labs  ?Lab 01/06/22 ?1247 01/06/22 ?1315  ?WBC 8.5  --   ?NEUTROABS 6.4  --   ?HGB 11.1* 11.2*  ?HCT 38.1* 33.0*  ?MCV 108.5*  --   ?PLT 117*  --   ? ?Basic Metabolic Panel: ?Recent Labs  ?Lab 01/06/22 ?1247 01/06/22 ?1315   ?NA 144 141  ?K 3.6 3.5  ?CL 112* 112*  ?CO2 20*  --   ?GLUCOSE 141* 138*  ?BUN 32* 33*  ?CREATININE 1.66* 1.60*  ?CALCIUM 9.0  --   ? ? ? ?No results found for this or any previous visit (from the past 240 hour(s)).  ? ?Radiology Studies: ?DG Cervical Spine With Flex & Extend ? ?Result Date: 01/07/2022 ?CLINICAL DATA:  Provided history: Cervical vertebral closed fracture, recent. Altered mental status. Neck pain. EXAM: CERVICAL SPINE COMPLETE WITH FLEXION AND EXTENSION VIEWS COMPARISON:  Cervical spine CT 12/10/2021. FINDINGS: Known subacute fracture of the C6 vertebral body, extending through a C6-C7 ventral osteophyte, better appreciated on the prior cervical spine CT of 12/10/2021. Grade 1 anterolisthesis at C2-C3 which increases with flexion and resolves with flexion. Grade 1 anterolisthesis at C3-C4, which increases with flexion and improves with extension. Grade 1 anterolisthesis at C4-C5, which increases with a flexion and improves with extension. Grade 1 anterolisthesis at C5-C6, which does not significantly change with flexion or extension. No significant spondylolisthesis at C6-C7. Cervical spondylosis with multilevel disc space narrowing, endplate osteophytes, uncovertebral hypertrophy and facet arthrosis. Disc  space narrowing is greatest at C5-C6 and C6-C7 (moderate to moderately advanced at these levels). Multilevel ventral osteophytes, most prominent at C5-C6 and C6-C7. Left chest multi-lead implantable cardiac device. Prior median sternotomy. IMPRESSION: Known subacute fracture of the C6 vertebral body, extending through a C6-C7 ventral osteophyte, better appreciated on the prior cervical spine CT of 12/10/2021. No significant spondylolisthesis or evidence of dynamic instability at the level of the patient's known subacute fracture (C6-C7). Grade 1 spondylolisthesis at C2-C3, C3-C4, C4-C5 and C5-C6, as described and likely due to facet arthrosis. Cervical spondylosis, as described. Electronically  Signed   By: Kellie Simmering D.O.   On: 01/07/2022 15:41  ? ?CT HEAD WO CONTRAST ? ?Result Date: 01/06/2022 ?CLINICAL DATA:  Provided history: Delirium. Additional history provided by scanning technologist: Rosalyn Charters

## 2022-01-08 NOTE — TOC Progression Note (Signed)
Transition of Care (TOC) - Progression Note  ? ? ?Patient Details  ?Name: [name] ?MRN: [id number] ?Date of Birth: July 08, 1933 ? ?Transition of Care (TOC) CM/SW Contact  ?Angelita Ingles, RN ?Phone [phone] ? ?01/08/2022, 3:50 PM ? ?Clinical Narrative:    ? ?Transition of Care (TOC) Screening Note ? ? ?Patient Details  ?Name: [name] ?Date of Birth: [birth year] ? ? ?Transition of Care (TOC) CM/SW Contact:    ?Angelita Ingles, RN ?Phone Number: ?01/08/2022, 3:50 PM ? ? ? ?Transition of Care Department [facility]) has reviewed patient and no TOC needs have been identified at this time. We will continue to monitor patient advancement through interdisciplinary progression rounds. Patient currently has recommendations for SNF however daughter is requesting that patient be evaluated for CIR. MD has been made aware. TOC continues to follow for disposition planning.  ? ? ? ? ?  ?  ? ?Expected Discharge Plan and Services ?  ?  ?  ?  ?  ?                ?  ?  ?  ?  ?  ?  ?  ?  ?  ?  ? ? ?Social Determinants of Health (SDOH) Interventions ?  ? ?Readmission Risk Interventions ?   ? View : No data to display.  ?  ?  ?  ? ? ?

## 2022-01-08 NOTE — Progress Notes (Addendum)
Per Dr. Kathyrn Sheriff note on 01/07/22 pt can change from hard c-collar to soft collar. Order placed.  ?

## 2022-01-09 DIAGNOSIS — Z7189 Other specified counseling: Secondary | ICD-10-CM | POA: Diagnosis not present

## 2022-01-09 DIAGNOSIS — R638 Other symptoms and signs concerning food and fluid intake: Secondary | ICD-10-CM

## 2022-01-09 DIAGNOSIS — S12501S Unspecified nondisplaced fracture of sixth cervical vertebra, sequela: Secondary | ICD-10-CM | POA: Diagnosis not present

## 2022-01-09 DIAGNOSIS — R1310 Dysphagia, unspecified: Secondary | ICD-10-CM | POA: Diagnosis not present

## 2022-01-09 DIAGNOSIS — Z515 Encounter for palliative care: Secondary | ICD-10-CM | POA: Diagnosis not present

## 2022-01-09 DIAGNOSIS — G9340 Encephalopathy, unspecified: Secondary | ICD-10-CM | POA: Diagnosis not present

## 2022-01-09 LAB — BASIC METABOLIC PANEL
Anion gap: 6 (ref 5–15)
BUN: 29 mg/dL — ABNORMAL HIGH (ref 8–23)
CO2: 24 mmol/L (ref 22–32)
Calcium: 8.9 mg/dL (ref 8.9–10.3)
Chloride: 114 mmol/L — ABNORMAL HIGH (ref 98–111)
Creatinine, Ser: 1.62 mg/dL — ABNORMAL HIGH (ref 0.61–1.24)
GFR, Estimated: 41 mL/min — ABNORMAL LOW (ref >60)
Glucose, Bld: 113 mg/dL — ABNORMAL HIGH (ref 70–99)
Potassium: 3.3 mmol/L — ABNORMAL LOW (ref 3.5–5.1)
Sodium: 144 mmol/L (ref 135–145)

## 2022-01-09 LAB — CBC
HCT: 34.6 % — ABNORMAL LOW (ref 39.0–52.0)
Hemoglobin: 10.9 g/dL — ABNORMAL LOW (ref 13.0–17.0)
MCH: 31.8 pg (ref 26.0–34.0)
MCHC: 31.5 g/dL (ref 30.0–36.0)
MCV: 100.9 fL — ABNORMAL HIGH (ref 80.0–100.0)
Platelets: 85 10*3/uL — ABNORMAL LOW (ref 150–400)
RBC: 3.43 MIL/uL — ABNORMAL LOW (ref 4.22–5.81)
RDW: 18.7 % — ABNORMAL HIGH (ref 11.5–15.5)
WBC: 6 10*3/uL (ref 4.0–10.5)
nRBC: 0 % (ref 0.0–0.2)

## 2022-01-09 LAB — GLUCOSE, CAPILLARY
Glucose-Capillary: 118 mg/dL — ABNORMAL HIGH (ref 70–99)
Glucose-Capillary: 146 mg/dL — ABNORMAL HIGH (ref 70–99)
Glucose-Capillary: 114 mg/dL — ABNORMAL HIGH (ref 70–99)
Glucose-Capillary: 128 mg/dL — ABNORMAL HIGH (ref 70–99)

## 2022-01-09 MED ORDER — POTASSIUM CHLORIDE 20 MEQ PO PACK
40.0000 meq | PACK | Freq: Once | ORAL | Status: DC
  Filled 2022-01-09: qty 2

## 2022-01-09 MED ORDER — LORAZEPAM 2 MG/ML IJ SOLN
0.5000 mg | Freq: Once | INTRAMUSCULAR | Status: AC
  Administered 2022-01-09: 0.5 mg via INTRAVENOUS
  Filled 2022-01-09: qty 1

## 2022-01-09 MED ORDER — POTASSIUM CHLORIDE CRYS ER 20 MEQ PO TBCR
40.0000 meq | EXTENDED_RELEASE_TABLET | Freq: Once | ORAL | Status: AC
  Administered 2022-01-09: 40 meq via ORAL
  Filled 2022-01-09: qty 2

## 2022-01-09 MED ORDER — ACETAMINOPHEN 500 MG PO TABS
1000.0000 mg | ORAL_TABLET | Freq: Three times a day (TID) | ORAL | Status: DC
  Administered 2022-01-09 – 2022-01-13 (×5): 1000 mg via ORAL
  Filled 2022-01-09 (×7): qty 2

## 2022-01-09 MED ORDER — ENSURE ENLIVE PO LIQD
237.0000 mL | Freq: Three times a day (TID) | ORAL | Status: DC
  Administered 2022-01-09 – 2022-01-21 (×7): 237 mL via ORAL

## 2022-01-09 MED ORDER — DEXTROSE-NACL 5-0.9 % IV SOLN: INTRAVENOUS | Status: DC

## 2022-01-09 MED ORDER — APIXABAN 2.5 MG PO TABS
2.5000 mg | ORAL_TABLET | Freq: Two times a day (BID) | ORAL | Status: DC
  Administered 2022-01-10 – 2022-01-13 (×5): 2.5 mg via ORAL
  Filled 2022-01-09 (×6): qty 1

## 2022-01-09 NOTE — Progress Notes (Signed)
Pt noted to have previous skin tear on left forearm dressing saturated this nurse changed Mepilex dressing x 2 d/t saturated with bright red blood. Noted to be continuously bleeding.This nurse applied gauze pressure dressing and Kerlix. MD notified as pt currently on Eliquis BID. Per Dr. Tawanna Solo hold next dose of Eliquis and resume in AM.

## 2022-01-09 NOTE — Progress Notes (Signed)
PROGRESS NOTE  [name]  LNL:[id number] DOB: 06/16/1933 DOA: 01/06/2022 PCP: Calvin Norlander, DO   Brief Narrative: Patient is a 88 year old male with history of paroxysmal A-fib, chronic systolic congestive heart failure, coronary artery disease status post CABG, sick sinus syndrome status post permanent pacemaker placement, COPD, diabetes type 2, hypertension, hyperlipidemia, chronic thrombocytopenia, recurrent UTI, sleep apnea not on CPAP who presented with altered mental status from home.  He was recently admitted here with polytrauma in April and was found to have C6 spinal fracture, multiple pelvic ring fracture and was discharged to  SNF, was discharged with hard collar.  He was recently treated for UTI.  At the SNF, patient continued to have poor oral intake, noted to have garbled speech and confused.  On presentation, CT head did not show any acute findings.  Patient was admitted for further evaluation.  Neurosurgery was consulted, no further intervention planned.  Hard collar transition to soft collar.  Mentation has improved.  PT/OT recommending skilled nursing  facility on discharge.  Medically stable for discharge whenever possible.   Assessment & Plan:  Principal Problem:   Acute encephalopathy Active Problems:   Dysarthria   Acute kidney injury superimposed on chronic kidney disease (HCC)   C6 cervical fracture (HCC)   History of pelvic hematoma   Paroxysmal atrial fibrillation (HCC)   Closed fracture of multiple pubic rami, right, sequela   HFrEF (heart failure with reduced ejection fraction) ([place])   Controlled type 2 diabetes mellitus without complication, without long-term current use of insulin (HCC)   COPD (chronic obstructive pulmonary disease) (HCC)   S/P CABG x 3   Second degree Mobitz II AV block   Hyperlipidemia with target LDL less than 100   OSA (obstructive sleep apnea)   Thrombocytopenia (HCC)   Protein-calorie malnutrition, severe  Acute metabolic  encephalopathy: History of recent trauma, UTI which was treated with antibiotics.  Infection work-up has been negative.  Urinalysis not reassuring.  Chest x-ray did not show pneumonia.  CT head did not show any acute intracranial abnormalities.  MRI not ordered with low suspicion of stroke.  No neurological deficits.  His mental status has definitely improved, currently he is alert and oriented. Monitor mental status.  Delirium precautions.  High risk of getting delirious due to advanced age.  Dysphagia: Patient history of polytrauma with C6 fracture.  Wearing hard collar.  Dysphagia suspected to be from hard collar.  Hard collar  changed to soft collar.  Speech therapy reconsulted.  Currently tolerating dysphagia 2 diet.  Patient history of polytrauma: Found to have C6 fracture on last admission.  Currently on soft collar.  Neurosurgery recommended outpatient follow-up.  Also found to have pelvic ring fracture with pelvic hematoma last admission.  Was residing in a nursing facility but was planned for discharge to home.  Continue pain management, supportive care.  PT/OT recommended skilled nursing facility on discharge .  Paroxysmal A-fib: Currently in normal sinus rhythm.  Eliquis was discontinued on last admission after polytrauma but has been resumed now.  Also on amiodarone.  Chronic systolic congestive heart failure: Euvolemic.  Takes Lasix, Entresto, Aldactone, beta-blockers at home: These are on hold.  Will consider resuming on discharge.  Blood pressure soft so we will continue holding for now.  Last echocardiogram  as on 04/2020 showed EF of 35%  Diabetes type 2: On metformin at home.  Currently on sliding scale insulin.  Monitor blood sugars  Hyperlipidemia: Takes crestor  Hypokalemia: Supplemented with potassium.  History of depression: On Remeron, Effexor  Sick sinus syndrome: Status post pacemaker placement  OSA: Intolerant to CPAP  Thrombocytopenia: Chronic.  Stable  CKD stage  IIIb: Baseline creatinine 1.4-1.6.  Kidney function  currently at baseline    Nutrition Problem: Severe Malnutrition Etiology: chronic illness (CHF, COPD, dysphagia)    DVT prophylaxis:SCD's Start: 01/06/22 1907 apixaban (ELIQUIS) tablet 5 mg     Code Status: Full Code  Family Communication: discussed with daughter [name] at bedside  Patient status:Obs  Patient is from :Home  Anticipated discharge to:SnF  Estimated DC date:as soon as bed is available   Consultants: None  Procedures:None  Antimicrobials:  Anti-infectives (From admission, onward)    None       Subjective:  Patient seen and examined at the bedside this morning.  He was sitting on the chair.  Daughter at the bedside.  Currently on soft collar.  Not in any kind of distress..  Tolerating dysphagia 2 diet. Objective: Vitals:   01/08/22 2316 01/09/22 0240 01/09/22 0719 01/09/22 1100  BP: 103/76  124/76 (!) 103/58  Pulse: 72 95 85 80  Resp:   18 14  Temp: 98 F (36.7 C) 98 F (36.7 C) 97.7 F (36.5 C) 98 F (36.7 C)  TempSrc: Oral Oral Oral Oral  SpO2: 98% 93% 98% 100%  Weight:        Intake/Output Summary (Last 24 hours) at 01/09/2022 1103 Last data filed at 01/09/2022 7829 Gross per 24 hour  Intake 530 ml  Output 1300 ml  Net -770 ml   Filed Weights   01/07/22 0334 01/08/22 0500  Weight: 77.1 kg 77.2 kg    Examination:   General exam: Overall comfortable, not in distress, deconditioned elderly male HEENT: Cervical soft collar Respiratory system:  no wheezes or crackles  Cardiovascular system: S1 & S2 heard, RRR.  Gastrointestinal system: Abdomen is nondistended, soft and nontender. Central nervous system: Alert and oriented Extremities: No edema, no clubbing ,no cyanosis Skin: No rashes, no ulcers,no icterus     Data Reviewed: I have personally reviewed following labs and imaging studies  CBC: Recent Labs  Lab 01/06/22 1247 01/06/22 1315 01/09/22 0330  WBC 8.5  --  6.0   NEUTROABS 6.4  --   --   HGB 11.1* 11.2* 10.9*  HCT 38.1* 33.0* 34.6*  MCV 108.5*  --  100.9*  PLT 117*  --  85*   Basic Metabolic Panel: Recent Labs  Lab 01/06/22 1247 01/06/22 1315 01/09/22 0330  NA 144 141 144  K 3.6 3.5 3.3*  CL 112* 112* 114*  CO2 20*  --  24  GLUCOSE 141* 138* 113*  BUN 32* 33* 29*  CREATININE 1.66* 1.60* 1.62*  CALCIUM 9.0  --  8.9     No results found for this or any previous visit (from the past 240 hour(s)).   Radiology Studies: DG Cervical Spine With Flex & Extend  Result Date: 01/07/2022 CLINICAL DATA:  Provided history: Cervical vertebral closed fracture, recent. Altered mental status. Neck pain. EXAM: CERVICAL SPINE COMPLETE WITH FLEXION AND EXTENSION VIEWS COMPARISON:  Cervical spine CT 12/10/2021. FINDINGS: Known subacute fracture of the C6 vertebral body, extending through a C6-C7 ventral osteophyte, better appreciated on the prior cervical spine CT of 12/10/2021. Grade 1 anterolisthesis at C2-C3 which increases with flexion and resolves with flexion. Grade 1 anterolisthesis at C3-C4, which increases with flexion and improves with extension. Grade 1 anterolisthesis at C4-C5, which increases with a flexion and improves with extension.  Grade 1 anterolisthesis at C5-C6, which does not significantly change with flexion or extension. No significant spondylolisthesis at C6-C7. Cervical spondylosis with multilevel disc space narrowing, endplate osteophytes, uncovertebral hypertrophy and facet arthrosis. Disc space narrowing is greatest at C5-C6 and C6-C7 (moderate to moderately advanced at these levels). Multilevel ventral osteophytes, most prominent at C5-C6 and C6-C7. Left chest multi-lead implantable cardiac device. Prior median sternotomy. IMPRESSION: Known subacute fracture of the C6 vertebral body, extending through a C6-C7 ventral osteophyte, better appreciated on the prior cervical spine CT of 12/10/2021. No significant spondylolisthesis or evidence of  dynamic instability at the level of the patient's known subacute fracture (C6-C7). Grade 1 spondylolisthesis at C2-C3, C3-C4, C4-C5 and C5-C6, as described and likely due to facet arthrosis. Cervical spondylosis, as described. Electronically Signed   By: Kellie Simmering D.O.   On: 01/07/2022 15:41    Scheduled Meds:  acetaminophen  1,000 mg Oral Q8H   amiodarone  200 mg Oral Daily   apixaban  5 mg Oral BID   azelastine  1 spray Each Nare BID   docusate sodium  100 mg Oral BID   feeding supplement  237 mL Oral TID BM   insulin aspart  0-9 Units Subcutaneous TID WC   mirtazapine  7.5 mg Oral QHS   mometasone-formoterol  2 puff Inhalation BID   nystatin  5 mL Oral QID   pantoprazole  40 mg Oral Daily   rosuvastatin  40 mg Oral QHS   venlafaxine XR  37.5 mg Oral QODAY   Continuous Infusions:     LOS: 0 days   Shelly Coss, MD [facility] P5/18/2023, 11:03 AM

## 2022-01-09 NOTE — Progress Notes (Addendum)
Inpatient Rehab Admissions Coordinator:   Consult received and chart reviewed.  Note therapy recommending SNF at this time and pt is observation status, with negative workup so far.  [facility] Medicare will not approve CIR for this patient due to low tolerance for therapy and lack of medical necessity (does not need daily physician follow up).  Will sign off.    Shann Medal, PT, DPT Admissions Coordinator [phone] 01/09/22  10:45 AM

## 2022-01-09 NOTE — Progress Notes (Signed)
Physical Therapy Treatment Patient Details Name: [name] MRN: [id number] DOB: 02-Jun-1933 Today's Date: 01/09/2022   History of Present Illness Pt is an 88 year old male admitted 01/06/22 from SNF with AMS and slurred speech. Of note: recent admission 4/18-4/25 with C6 fx and pubic rami fx nonoperative mgmt. PMH includes: CAD, COPD, CVA, GERD, HTN, hx of CABG, PSA, a fib, hx of prostate cancer, DM, vertigo.    PT Comments    Pt tolerates treatment well with only intermittent complaints of pain. Pt's daughter present and assisting during session. Pt demonstrates left lateral lean at times in sitting and at all times in standing. PT attempts to provides cues for foot placement to widen BOS and improve balance however this is unable to correct leftward lean. Pt remains at a high risk for falls due to his instability and will benefit from continued acute PT services in an effort to reduce falls risk and caregiver burden. PT continues to recommend SNF placement. Pt's daughter requesting a screen from [facility], consult placed.   Recommendations for follow up therapy are one component of a multi-disciplinary discharge planning process, led by the attending physician.  Recommendations may be updated based on patient status, additional functional criteria and insurance authorization.  Follow Up Recommendations  Skilled nursing-short term rehab (<3 hours/day)     Assistance Recommended at Discharge Frequent or constant Supervision/Assistance  Patient can return home with the following Two people to help with walking and/or transfers;Two people to help with bathing/dressing/bathroom;Assistance with cooking/housework;Assistance with feeding;Direct supervision/assist for medications management;Direct supervision/assist for financial management;Assist for transportation;Help with stairs or ramp for entrance   Equipment Recommendations  None recommended by PT    Recommendations for Other Services Rehab  consult (family requesting)     Precautions / Restrictions Precautions Precautions: Fall;Cervical Precaution Booklet Issued: No Precaution Comments: watch BP Required Braces or Orthoses: Cervical Brace Cervical Brace: Soft collar;For comfort Restrictions Weight Bearing Restrictions: Yes RLE Weight Bearing: Weight bearing as tolerated LLE Weight Bearing: Weight bearing as tolerated     Mobility  Bed Mobility Overal bed mobility: Needs Assistance Bed Mobility: Supine to Sit     Supine to sit: Mod assist, HOB elevated     General bed mobility comments: pt is able to mobilize LEs to edge of bed as well as significant assistance to elevate trunk into sitting    Transfers Overall transfer level: Needs assistance Equipment used: Rolling walker (2 wheels), 2 person hand held assist Transfers: Sit to/from Stand, Bed to chair/wheelchair/BSC Sit to Stand: Mod assist, +2 physical assistance     Squat pivot transfers: Max assist, +2 physical assistance     General transfer comment: pt performs 3 sit to stands with assistance of PT and pt's daughter    Ambulation/Gait                   Stairs             Wheelchair Mobility    Modified Rankin (Stroke Patients Only)       Balance Overall balance assessment: Needs assistance Sitting-balance support: Single extremity supported, Feet supported Sitting balance-Leahy Scale: Poor Sitting balance - Comments: L lateral lean at times Postural control: Left lateral lean Standing balance support: Bilateral upper extremity supported Standing balance-Leahy Scale: Poor Standing balance comment: mod-maxA, left lateral lean                            Cognition Arousal/Alertness: Awake/alert Behavior  During Therapy: WFL for tasks assessed/performed Overall Cognitive Status: Impaired/Different from baseline Area of Impairment: Orientation, Attention, Memory, Following commands, Safety/judgement, Awareness,  Problem solving                 Orientation Level: Disoriented to, Time, Place Current Attention Level: Sustained Memory: Decreased recall of precautions, Decreased short-term memory Following Commands: Follows one step commands with increased time Safety/Judgement: Decreased awareness of safety, Decreased awareness of deficits Awareness: Emergent Problem Solving: Slow processing, Difficulty sequencing, Requires verbal cues          Exercises      General Comments General comments (skin integrity, edema, etc.): VSS on RA      Pertinent Vitals/Pain Pain Assessment Pain Assessment: Faces Faces Pain Scale: Hurts even more Pain Location: hips Pain Descriptors / Indicators: Grimacing Pain Intervention(s): Monitored during session    Home Living                          Prior Function            PT Goals (current goals can now be found in the care plan section) Acute Rehab PT Goals Patient Stated Goal: to eventually return home Progress towards PT goals: Progressing toward goals (slowly)    Frequency    Min 3X/week      PT Plan Current plan remains appropriate    Co-evaluation              AM-PAC PT "6 Clicks" Mobility   Outcome Measure  Help needed turning from your back to your side while in a flat bed without using bedrails?: A Lot Help needed moving from lying on your back to sitting on the side of a flat bed without using bedrails?: A Lot Help needed moving to and from a bed to a chair (including a wheelchair)?: Total Help needed standing up from a chair using your arms (e.g., wheelchair or bedside chair)?: A Lot Help needed to walk in hospital room?: Total Help needed climbing 3-5 steps with a railing? : Total 6 Click Score: 9    End of Session Equipment Utilized During Treatment: Cervical collar Activity Tolerance: Patient tolerated treatment well Patient left: in chair;with call bell/phone within reach;with chair alarm  set;with family/visitor present Nurse Communication: Mobility status;Need for lift equipment (use of STEDY) PT Visit Diagnosis: History of falling (Z91.81);Pain;Muscle weakness (generalized) (M62.81);Unsteadiness on feet (R26.81) Pain - Right/Left: Right Pain - part of body: Hip     Time: 7622-6333 PT Time Calculation (min) (ACUTE ONLY): 24 min  Charges:  $Therapeutic Activity: 23-37 mins                     Zenaida Niece, PT, DPT Acute Rehabilitation Pager: [phone] Office [place] 01/09/2022, 10:04 AM

## 2022-01-09 NOTE — Progress Notes (Signed)
Patient ID: [name], male   DOB: 1933/02/14, 88 y.o.   MRN: [id number]    Progress Note from the Palliative Medicine Team at [facility]   Patient Name: [name]        Date: 01/09/2022 DOB: 09/21/1932  Age: 88 y.o. MRN#: [id number] Attending Physician: Shelly Coss, MD Primary Care Physician: [name], DO Admit Date: 01/06/2022   Medical records reviewed   88 y.o. male   admitted on 01/06/2022 with  past medical history significant of PAF on Eliquis, HFrEF (EF 35% by TTE 04/25/2020), CAD s/p CABG, Mobitz 2 second-degree AV block s/p PPM, COPD, T2DM, HTN, HLD, thrombocytopenia, recurrent UTIs, OSA not tolerating CPAP,  AMS    He was going to be discharged home from the SNF after rehab but was altered and had slurred speech. He fell 3 weeks ago and has 3 fractures in his ramus and a cervical fracture. Admitted to [facility] at [facility] from 4/18-4/25. Discharged to SNF for rehab.    His daughter states that his speech has been gargled for a few days-weeks. He has had poor PO intake since being at the SNF. He was alert, but not oriented which is not his baseline. He would also say some things that didn't make sense. They called his daughter who is an ICU nurse and advised they call EMS.    Baseline A&O x 4. Recently treated for UTI and finished abx yesterday.   This NP visited patient at the bedside to meet with daughter/ [name] as scheduled for  continued conversation regarding current medical situation and to discuss decisions related to treatment option, advanced directives and anticipatory care needs.  I expressed my concern the patient is not eating and drinking enough to support himself from a nutritional standpoint.[name] tells me that she sees this also and believes that family would consider PEG tube if patient does not begin to rebound.  Education offered on risks and benefits of PEG tube in an elderly patient with multiple comorbidities.   Plan of  Care: -Full Code     - Educated family to consider DNR/DNI status understanding evidenced based poor outcomes in similar hospitalized patient, as the cause of arrest is likely associated with advanced chronic illness rather than an easily reversible acute cardio-pulmonary event.  -Family is open to all offered and available medical interventions to prolong life;   treat the treatable and hope for improvement and ultimately return home.         -Family is contemplating artificial feeding/PEG.   Calorie count ordered -Underlying chronic pain-Tylenol 1000 mg p.o. every 8 hours scheduled/discussed with daughter and she agrees -Family is interested in evaluation for inpatient rehab, they will consider SNF if indicated -Meeting is scheduled for Monday 01-13-22 at 2:00 with daughter [name] again for discussion regarding neck steps and plan of care.  They are hopeful that outcomes through the weekend will help direct their decisions.  This nurse practitioner informed  the family and the attending that I will be out of the hospital until Monday morning.  If the patient is still hospitalized I will follow-up at that time.  Call palliative medicine team phone # [phone] with questions or concerns in the interim    Education offered today regarding  the importance of continued conversation with family and the medical providers regarding overall plan of care and treatment options,  ensuring decisions are within the context of the patients values and GOCs.  Questions and concerns  addressed   Discussed with Dr Tawanna Solo and [facility] team    Wadie Lessen NP  Palliative Medicine Team Team Phone # [phone] Pager [phone]

## 2022-01-09 NOTE — Progress Notes (Signed)
Calorie Count Note: Day 1 Results  48-hour calorie count ordered. Calorie count was started on 01/09/22 at 1200 with lunch meal. Please see day 1 results below. RD will follow up with day 2 results on Monday, 01/13/22.  Diet: dysphagia 2 with thin liquids Supplements:  - Ensure Enlive TID - Magic Cup TID  Day 1: 5/18 Lunch: 0 kcal, 0 grams of protein 5/18 Dinner: 15 kcal, 1 grams of protein 5/19 Breakfast: 0 kcal, 0 grams of protein Supplements: 350 kcal, 20 grams of protein  Day 1 total 24-hour intake: 365 kcal (20% of minimum estimated needs)  21 grams of protein (22% of minimum estimated needs)  Nutrition Diagnosis:  Severe Malnutrition related to chronic illness (CHF, COPD, dysphagia) as evidenced by severe fat depletion, severe muscle depletion, percent weight loss (14.1% weight loss in less than 7 months).  Goal: Patient will meet greater than or equal to 90% of their needs  Intervention:  - Continue calorie count - Continue Ensure Enlive TID - Continue Magic Cup TID - Order feeding assistance with all meals - Per Palliative Care note, family is contemplating artificial feeing/PEG. Next meeting is planned for Monday 01/13/22 at 2:00 pm. Will follow for [place] discussions regarding artificial feeding. - If enteral nutrition is pursued, recommend starting Osmolite 1.5 @ 20 ml/hr and advancing by 10 ml q 8 hours to goal rate of 50 ml/hr (1200 ml/day). At goal rate, tube feeding would provide 1880 kcal, 97 grams of protein, and 914 ml of H2O. Pt is at refeeding risk.   Gustavus Bryant, MS, RD, LDN Inpatient Clinical Dietitian Please see [facility] for contact information.

## 2022-01-09 NOTE — Progress Notes (Signed)
Patient screaming and trying to get of bed. Messaged MD and 0.5 of ativan ordered. Ativan administered. Will continue to monitor the patient throughout the night.

## 2022-01-09 NOTE — Progress Notes (Addendum)
Nutrition Follow-up  DOCUMENTATION CODES:   Severe malnutrition in context of chronic illness  INTERVENTION:   ADDENDUM (1355): MD consulted RD for calorie count. Will order 48-hour calorie count to start today at lunch meal. RD to hang envelope on pt's door and communicate with RN. Will follow-up tomorrow to provide day 1 results.  - Ensure Enlive po TID, each supplement provides 350 kcal and 20 grams of protein  - Magic Cup TID with meals, each supplement provides 290 kcal and 9 grams of protein  - Encourage PO intake and provide feeding assistance as needed  If PO intake does not improve with diet advancement and oral nutrition supplements, recommend Cortrak placement and initiation of enteral nutrition given severe malnutrition and significant recent weight loss.  NUTRITION DIAGNOSIS:   Severe Malnutrition related to chronic illness (CHF, COPD, dysphagia) as evidenced by severe fat depletion, severe muscle depletion, percent weight loss (14.1% weight loss in less than 7 months).  Ongoing, being addressed via diet advancement and oral nutrition supplements  GOAL:   Patient will meet greater than or equal to 90% of their needs  Unmet at this time  MONITOR:   Diet advancement, Labs, Weight trends, I & O's  REASON FOR ASSESSMENT:   Consult Assessment of nutrition requirement/status, Poor PO  ASSESSMENT:   88 year old male who presented to the ED on 5/15 with AMS. PMH of PAF, CHF, CAD s/p CABG, Mobitz 2 second-degree AV block s/p PPM, COPD, T2DM, HTN, HLD, thrombocytopenia, recurrent UTIs, OSA. Pt admitted with acute encephalopathy, AKI, C6 cervical fx.  05/16 - NPO 05/17 - diet advanced to dysphagia 2 with thin liquids  Discussed pt with RN and granddaughter at bedside. Pt sleeping at time of RD visit and did not awaken. RN reports that pt did not eat any solid food for breakfast this morning but did drink 2 cartons of orange juice. Pt closed his mouth or pushed food  away when it was offered to him.  Pt's daughter reports that pt likes sweet things. Will try ordering Ensure Enlive TID between meals. Pt will likely require assistance in consuming these. Will also add Magic Cups TID to meal trays to maximize PO intake.  RD also placed pt's lunch meal order of meatloaf with gravy, mashed potatoes with gravy, yogurt, Magic Cup, and orange juice. Pt's daughter reports that another family member will be present for lunch meal.  If PO intake does not improve with diet advancement and oral nutrition supplements, recommend Cortrak placement and initiation of enteral nutrition given severe malnutrition and significant recent weight loss.  Admit weight: 77.1 kg Current weight: 77.2 kg  Pt with non-pitting edema to BLE.  Meal Completion: 0-10%  Medications reviewed and include: colace, SSI, remeron, nystatin mouthwash, protonix  Labs reviewed: potassium 3.3, BUN 29, creatinine 1.62, platelets 85 CBG's: 66-118 x 24 hours  UOP: 1300 ml x 24 hours  Diet Order:   Diet Order             DIET DYS 2 Room service appropriate? Yes with Assist; Fluid consistency: Thin  Diet effective now                   EDUCATION NEEDS:   Not appropriate for education at this time  Skin:  Skin Assessment: Reviewed RN Assessment  Last BM:  no documented BM  Height:   Ht Readings from Last 1 Encounters:  12/11/21 '5\' 11"'$  (1.803 m)    Weight:   Wt Readings from Last  1 Encounters:  01/08/22 77.2 kg    BMI:  Body mass index is 23.74 kg/m.  Estimated Nutritional Needs:   Kcal:  1800-2000  Protein:  95-115 grams  Fluid:  1.8 L/day    Gustavus Bryant, MS, RD, LDN Inpatient Clinical Dietitian Please see AMiON for contact information.

## 2022-01-09 NOTE — Progress Notes (Signed)
Speech Language Pathology Treatment: Dysphagia  Patient Details Name: [name] MRN: [id number] DOB: 05/08/1933 Today's Date: 01/09/2022 Time: 7262-0355 SLP Time Calculation (min) (ACUTE ONLY): 11 min  Assessment / Plan / Recommendation Clinical Impression  Pt would only sit partially upright for PO trials, so bed was repositioned as upright as possible before he consumed very small amounts of POs offered. This included a bite of a graham cracker, which was slow to be masticated and left oral residue behind despite use of liquid wash. This is not ideal especially when combined with suboptimal positioning. He had no overt difficulties with puree and a only a delayed throat clear with thin liquids. Per RN, he has been taking in very little overall but without overt difficulties. Will leave on current diet, working on positioning and use of aspiration precautions.    HPI HPI: [name] is a 88 y.o. male who presented with AMS and slurred speech.  CXR and Head CT 5/15 negative for acute findings.  Pt recently admitted for fall 4/18-4/25 with C5 and ramus fractures and d/c'd to SNF.  He has had poor PO intake since being at the SNF. On admission, he was alert, but not oriented which is not his baseline. Baseline A&O x 4. Pt recently treated for UTI.  PMH: PAF on Eliquis, HFrEF (EF 35% by TTE 04/25/2020), CAD s/p CABG, Mobitz 2 second-degree AV block s/p PPM, COPD, T2DM, HTN, HLD, thrombocytopenia, recurrent UTIs, OSA not tolerating CPAP. SLP evaluation 12/15/21: articulatory imprecision with reduced speech intelligibility, cognitive impairments noted in temporal orientation, memory, awareness, attention, and executive function.      SLP Plan  Continue with current plan of care      Recommendations for follow up therapy are one component of a multi-disciplinary discharge planning process, led by the attending physician.  Recommendations may be updated based on patient status, additional functional  criteria and insurance authorization.    Recommendations  Diet recommendations: Dysphagia 2 (fine chop);Thin liquid Liquids provided via: Cup;Straw Medication Administration: Whole meds with puree Supervision: Staff to assist with self feeding Compensations: Slow rate;Small sips/bites Postural Changes and/or Swallow Maneuvers: Seated upright 90 degrees                Oral Care Recommendations: Oral care QID Follow Up Recommendations: Skilled nursing-short term rehab (<3 hours/day) Assistance recommended at discharge: Frequent or constant Supervision/Assistance SLP Visit Diagnosis: Cognitive communication deficit (H74.163) Plan: Continue with current plan of care           Osie Bond., M.A. [place] Office [phone]  Secure chat preferred   01/09/2022, 12:29 PM

## 2022-01-10 DIAGNOSIS — Z515 Encounter for palliative care: Secondary | ICD-10-CM | POA: Diagnosis not present

## 2022-01-10 DIAGNOSIS — J449 Chronic obstructive pulmonary disease, unspecified: Secondary | ICD-10-CM | POA: Diagnosis present

## 2022-01-10 DIAGNOSIS — G309 Alzheimer's disease, unspecified: Secondary | ICD-10-CM | POA: Diagnosis not present

## 2022-01-10 DIAGNOSIS — R638 Other symptoms and signs concerning food and fluid intake: Secondary | ICD-10-CM | POA: Diagnosis not present

## 2022-01-10 DIAGNOSIS — I2510 Atherosclerotic heart disease of native coronary artery without angina pectoris: Secondary | ICD-10-CM | POA: Diagnosis present

## 2022-01-10 DIAGNOSIS — Z4682 Encounter for fitting and adjustment of non-vascular catheter: Secondary | ICD-10-CM | POA: Diagnosis not present

## 2022-01-10 DIAGNOSIS — E119 Type 2 diabetes mellitus without complications: Secondary | ICD-10-CM | POA: Diagnosis not present

## 2022-01-10 DIAGNOSIS — Z6823 Body mass index (BMI) 23.0-23.9, adult: Secondary | ICD-10-CM | POA: Diagnosis not present

## 2022-01-10 DIAGNOSIS — R1310 Dysphagia, unspecified: Secondary | ICD-10-CM | POA: Diagnosis present

## 2022-01-10 DIAGNOSIS — Z951 Presence of aortocoronary bypass graft: Secondary | ICD-10-CM | POA: Diagnosis not present

## 2022-01-10 DIAGNOSIS — R531 Weakness: Secondary | ICD-10-CM | POA: Diagnosis not present

## 2022-01-10 DIAGNOSIS — I700 Atherosclerosis of aorta: Secondary | ICD-10-CM | POA: Diagnosis not present

## 2022-01-10 DIAGNOSIS — N1832 Chronic kidney disease, stage 3b: Secondary | ICD-10-CM | POA: Diagnosis present

## 2022-01-10 DIAGNOSIS — W19XXXS Unspecified fall, sequela: Secondary | ICD-10-CM | POA: Diagnosis present

## 2022-01-10 DIAGNOSIS — N179 Acute kidney failure, unspecified: Secondary | ICD-10-CM | POA: Diagnosis present

## 2022-01-10 DIAGNOSIS — Z950 Presence of cardiac pacemaker: Secondary | ICD-10-CM | POA: Diagnosis not present

## 2022-01-10 DIAGNOSIS — I495 Sick sinus syndrome: Secondary | ICD-10-CM | POA: Diagnosis present

## 2022-01-10 DIAGNOSIS — Z87440 Personal history of urinary (tract) infections: Secondary | ICD-10-CM | POA: Diagnosis not present

## 2022-01-10 DIAGNOSIS — Z931 Gastrostomy status: Secondary | ICD-10-CM | POA: Diagnosis not present

## 2022-01-10 DIAGNOSIS — E1165 Type 2 diabetes mellitus with hyperglycemia: Secondary | ICD-10-CM | POA: Diagnosis present

## 2022-01-10 DIAGNOSIS — E1122 Type 2 diabetes mellitus with diabetic chronic kidney disease: Secondary | ICD-10-CM | POA: Diagnosis present

## 2022-01-10 DIAGNOSIS — G9340 Encephalopathy, unspecified: Secondary | ICD-10-CM | POA: Diagnosis not present

## 2022-01-10 DIAGNOSIS — I441 Atrioventricular block, second degree: Secondary | ICD-10-CM | POA: Diagnosis present

## 2022-01-10 DIAGNOSIS — R339 Retention of urine, unspecified: Secondary | ICD-10-CM | POA: Diagnosis present

## 2022-01-10 DIAGNOSIS — G4733 Obstructive sleep apnea (adult) (pediatric): Secondary | ICD-10-CM | POA: Diagnosis present

## 2022-01-10 DIAGNOSIS — E43 Unspecified severe protein-calorie malnutrition: Secondary | ICD-10-CM | POA: Diagnosis present

## 2022-01-10 DIAGNOSIS — I480 Paroxysmal atrial fibrillation: Secondary | ICD-10-CM | POA: Diagnosis present

## 2022-01-10 DIAGNOSIS — I130 Hypertensive heart and chronic kidney disease with heart failure and stage 1 through stage 4 chronic kidney disease, or unspecified chronic kidney disease: Secondary | ICD-10-CM | POA: Diagnosis present

## 2022-01-10 DIAGNOSIS — I5022 Chronic systolic (congestive) heart failure: Secondary | ICD-10-CM | POA: Diagnosis present

## 2022-01-10 DIAGNOSIS — D696 Thrombocytopenia, unspecified: Secondary | ICD-10-CM | POA: Diagnosis present

## 2022-01-10 DIAGNOSIS — R4182 Altered mental status, unspecified: Secondary | ICD-10-CM | POA: Diagnosis present

## 2022-01-10 DIAGNOSIS — F32A Depression, unspecified: Secondary | ICD-10-CM | POA: Diagnosis present

## 2022-01-10 DIAGNOSIS — E785 Hyperlipidemia, unspecified: Secondary | ICD-10-CM | POA: Diagnosis present

## 2022-01-10 DIAGNOSIS — G9341 Metabolic encephalopathy: Secondary | ICD-10-CM | POA: Diagnosis present

## 2022-01-10 DIAGNOSIS — R109 Unspecified abdominal pain: Secondary | ICD-10-CM | POA: Diagnosis not present

## 2022-01-10 LAB — BASIC METABOLIC PANEL
Anion gap: 5 (ref 5–15)
BUN: 30 mg/dL — ABNORMAL HIGH (ref 8–23)
CO2: 23 mmol/L (ref 22–32)
Calcium: 8.7 mg/dL — ABNORMAL LOW (ref 8.9–10.3)
Chloride: 119 mmol/L — ABNORMAL HIGH (ref 98–111)
Creatinine, Ser: 1.69 mg/dL — ABNORMAL HIGH (ref 0.61–1.24)
GFR, Estimated: 39 mL/min — ABNORMAL LOW (ref >60)
Glucose, Bld: 138 mg/dL — ABNORMAL HIGH (ref 70–99)
Potassium: 3.2 mmol/L — ABNORMAL LOW (ref 3.5–5.1)
Sodium: 147 mmol/L — ABNORMAL HIGH (ref 135–145)

## 2022-01-10 LAB — MAGNESIUM: Magnesium: 1.9 mg/dL (ref 1.7–2.4)

## 2022-01-10 LAB — GLUCOSE, CAPILLARY
Glucose-Capillary: 143 mg/dL — ABNORMAL HIGH (ref 70–99)
Glucose-Capillary: 119 mg/dL — ABNORMAL HIGH (ref 70–99)
Glucose-Capillary: 119 mg/dL — ABNORMAL HIGH (ref 70–99)
Glucose-Capillary: 105 mg/dL — ABNORMAL HIGH (ref 70–99)

## 2022-01-10 MED ORDER — POTASSIUM CHLORIDE 10 MEQ/100ML IV SOLN
10.0000 meq | INTRAVENOUS | Status: AC
  Administered 2022-01-10 (×4): 10 meq via INTRAVENOUS
  Filled 2022-01-10 (×4): qty 100

## 2022-01-10 MED ORDER — HALOPERIDOL LACTATE 5 MG/ML IJ SOLN
1.0000 mg | Freq: Four times a day (QID) | INTRAMUSCULAR | Status: DC | PRN
  Administered 2022-01-10 – 2022-01-17 (×3): 1 mg via INTRAVENOUS
  Filled 2022-01-10 (×4): qty 1

## 2022-01-10 MED ORDER — POTASSIUM CHLORIDE CRYS ER 20 MEQ PO TBCR
40.0000 meq | EXTENDED_RELEASE_TABLET | Freq: Once | ORAL | Status: DC
  Filled 2022-01-10: qty 2

## 2022-01-10 MED ORDER — DEXTROSE 5 % IV SOLN: INTRAVENOUS | Status: DC

## 2022-01-10 MED ORDER — QUETIAPINE FUMARATE 50 MG PO TABS
25.0000 mg | ORAL_TABLET | Freq: Every day | ORAL | Status: DC
  Administered 2022-01-10: 25 mg via ORAL
  Filled 2022-01-10: qty 1

## 2022-01-10 NOTE — Progress Notes (Signed)
PROGRESS NOTE  [name]  UVO:[id number] DOB: [birth year] DOA: 01/06/2022 PCP: [name], DO   Brief Narrative: Patient is a 88 year old male with history of paroxysmal A-fib, chronic systolic congestive heart failure, coronary artery disease status post CABG, sick sinus syndrome status post permanent pacemaker placement, COPD, diabetes type 2, hypertension, hyperlipidemia, chronic thrombocytopenia, recurrent UTI, sleep apnea not on CPAP who presented with altered mental status from home.  He was recently admitted here with polytrauma in April and was found to have C6 spinal fracture, multiple pelvic ring fracture and was discharged to  SNF, was discharged with hard collar.  He was recently treated for UTI.  At the SNF, patient continued to have poor oral intake, noted to have garbled speech and confused.  On presentation, CT head did not show any acute findings.  Patient was admitted for further evaluation.  Neurosurgery was consulted, no further intervention planned.  Hard collar transition to soft collar.  Mentation has improved.  PT/OT recommending skilled nursing  facility on discharge.  Currently on calorie count because of poor oral intake.   Assessment & Plan:  Principal Problem:   Acute encephalopathy Active Problems:   Dysarthria   Acute kidney injury superimposed on chronic kidney disease (HCC)   C6 cervical fracture (HCC)   History of pelvic hematoma   Paroxysmal atrial fibrillation (HCC)   Closed fracture of multiple pubic rami, right, sequela   HFrEF (heart failure with reduced ejection fraction) ([place])   Controlled type 2 diabetes mellitus without complication, without long-term current use of insulin (HCC)   COPD (chronic obstructive pulmonary disease) (HCC)   S/P CABG x 3   Second degree Mobitz II AV block   Hyperlipidemia with target LDL less than 100   OSA (obstructive sleep apnea)   Thrombocytopenia (HCC)   Protein-calorie malnutrition, severe  Acute  metabolic encephalopathy: History of recent trauma, UTI which was treated with antibiotics.  Infection work-up has been negative.  Urinalysis not reassuring.  Chest x-ray did not show pneumonia.  CT head did not show any acute intracranial abnormalities.  MRI not ordered with low suspicion of stroke.  No neurological deficits.  Monitor mental status.  Delirium precautions.  Looks like he is getting delirious again.  Started on low-dose Seroquel, Haldol for severe agitation.  Dysphagia: Patient history of polytrauma with C6 fracture.  Wearing hard collar.  Dysphagia suspected to be from hard collar.  Hard collar  changed to soft collar.  Speech therapy reconsulted.  Dietitian following.  Currently on calorie count.  Family agreeable for PEG if he continues to have poor oral intake.  Also started on gentle IV fluids.  Recent history of polytrauma: Found to have C6 fracture on last admission.  Currently on soft collar.  Neurosurgery recommended outpatient follow-up.  Also found to have pelvic ring fracture with pelvic hematoma last admission.  Was residing in a nursing facility but was planned for discharge to home.  Continue pain management, supportive care.  PT/OT recommended skilled nursing facility on discharge .  Paroxysmal A-fib: Currently in normal sinus rhythm.  Eliquis was discontinued on last admission after polytrauma but has been resumed at lowe dose now.  Also on amiodarone.  Chronic systolic congestive heart failure: Euvolemic.  Takes Lasix, Entresto, Aldactone, beta-blockers at home: These are on hold.  Blood pressure soft so we will continue holding for now.  Last echocardiogram  as on 04/2020 showed EF of 35%  Diabetes type 2: On metformin at home.  Currently  on sliding scale insulin.  Monitor blood sugars  Hyperlipidemia: Takes crestor  Hypokalemia: Supplemented with potassium.  History of depression: On Remeron, Effexor  Sick sinus syndrome: Status post pacemaker placement  OSA:  Intolerant to CPAP  Thrombocytopenia: Chronic.  Stable  CKD stage IIIb: Baseline creatinine 1.4-1.6.  Kidney function  currently at baseline    Nutrition Problem: Severe Malnutrition Etiology: chronic illness (CHF, COPD, dysphagia)    DVT prophylaxis:apixaban (ELIQUIS) tablet 2.5 mg Start: 01/09/22 2200 SCD's Start: 01/06/22 1907 apixaban (ELIQUIS) tablet 2.5 mg     Code Status: Full Code  Family Communication: discussed with daughter [name] on 01/10/22  Patient status:Obs  Patient is from :Home  Anticipated discharge to:SnF  Estimated DC date:Next week   Consultants: None  Procedures:None  Antimicrobials:  Anti-infectives (From admission, onward)    None       Subjective:  Patient seen and examined at the bedside this morning.  Hemodynamically stable, lying on the bed, mildly agitated and confused.  Breakfast near the bedside  Objective: Vitals:   01/10/22 0318 01/10/22 0500 01/10/22 0752 01/10/22 0910  BP: 109/82  112/67   Pulse: 81  72 77  Resp:   16 16  Temp: 98 F (36.7 C)  98 F (36.7 C)   TempSrc: Oral  Oral   SpO2: 91%  95% 96%  Weight:  76.3 kg      Intake/Output Summary (Last 24 hours) at 01/10/2022 1014 Last data filed at 01/10/2022 0847 Gross per 24 hour  Intake 822.78 ml  Output 475 ml  Net 347.78 ml   Filed Weights   01/07/22 0334 01/08/22 0500 01/10/22 0500  Weight: 77.1 kg 77.2 kg 76.3 kg    Examination:  General exam: Mildly agitated, confused, deconditioned HEENT: Soft cervical collar Respiratory system:  no wheezes or crackles  Cardiovascular system: S1 & S2 heard, RRR.  Gastrointestinal system: Abdomen is nondistended, soft and nontender. Central nervous system: Alert and awake but not oriented Extremities: No edema, no clubbing ,no cyanosis Skin: No rashes, no ulcers,no icterus     Data Reviewed: I have personally reviewed following labs and imaging studies  CBC: Recent Labs  Lab 01/06/22 1247 01/06/22 1315  01/09/22 0330  WBC 8.5  --  6.0  NEUTROABS 6.4  --   --   HGB 11.1* 11.2* 10.9*  HCT 38.1* 33.0* 34.6*  MCV 108.5*  --  100.9*  PLT 117*  --  85*   Basic Metabolic Panel: Recent Labs  Lab 01/06/22 1247 01/06/22 1315 01/09/22 0330 01/10/22 0050  NA 144 141 144 147*  K 3.6 3.5 3.3* 3.2*  CL 112* 112* 114* 119*  CO2 20*  --  24 23  GLUCOSE 141* 138* 113* 138*  BUN 32* 33* 29* 30*  CREATININE 1.66* 1.60* 1.62* 1.69*  CALCIUM 9.0  --  8.9 8.7*  MG  --   --   --  1.9     No results found for this or any previous visit (from the past 240 hour(s)).   Radiology Studies: No results found.  Scheduled Meds:  acetaminophen  1,000 mg Oral Q8H   amiodarone  200 mg Oral Daily   apixaban  2.5 mg Oral BID   azelastine  1 spray Each Nare BID   docusate sodium  100 mg Oral BID   feeding supplement  237 mL Oral TID BM   insulin aspart  0-9 Units Subcutaneous TID WC   mirtazapine  7.5 mg Oral QHS   mometasone-formoterol  2 puff Inhalation BID   nystatin  5 mL Oral QID   pantoprazole  40 mg Oral Daily   QUEtiapine  25 mg Oral QHS   rosuvastatin  40 mg Oral QHS   venlafaxine XR  37.5 mg Oral QODAY   Continuous Infusions:  dextrose 50 mL/hr at 01/10/22 0934   potassium chloride 10 mEq (01/10/22 0944)      LOS: 0 days   Shelly Coss, MD [facility] P5/19/2023, 10:14 AM

## 2022-01-11 DIAGNOSIS — G9340 Encephalopathy, unspecified: Secondary | ICD-10-CM | POA: Diagnosis not present

## 2022-01-11 LAB — BASIC METABOLIC PANEL
Anion gap: 5 (ref 5–15)
BUN: 25 mg/dL — ABNORMAL HIGH (ref 8–23)
CO2: 22 mmol/L (ref 22–32)
Calcium: 8.7 mg/dL — ABNORMAL LOW (ref 8.9–10.3)
Chloride: 119 mmol/L — ABNORMAL HIGH (ref 98–111)
Creatinine, Ser: 1.63 mg/dL — ABNORMAL HIGH (ref 0.61–1.24)
GFR, Estimated: 41 mL/min — ABNORMAL LOW (ref >60)
Glucose, Bld: 132 mg/dL — ABNORMAL HIGH (ref 70–99)
Potassium: 4 mmol/L (ref 3.5–5.1)
Sodium: 146 mmol/L — ABNORMAL HIGH (ref 135–145)

## 2022-01-11 LAB — GLUCOSE, CAPILLARY
Glucose-Capillary: 142 mg/dL — ABNORMAL HIGH (ref 70–99)
Glucose-Capillary: 110 mg/dL — ABNORMAL HIGH (ref 70–99)
Glucose-Capillary: 116 mg/dL — ABNORMAL HIGH (ref 70–99)
Glucose-Capillary: 201 mg/dL — ABNORMAL HIGH (ref 70–99)

## 2022-01-11 NOTE — Progress Notes (Addendum)
PROGRESS NOTE  [name]  HUT:[id number] DOB: 1932/12/02 DOA: 01/06/2022 PCP: [name], DO   Brief Narrative: Patient is a 88 year old male with history of paroxysmal A-fib, chronic systolic congestive heart failure, coronary artery disease status post CABG, sick sinus syndrome status post permanent pacemaker placement, COPD, diabetes type 2, hypertension, hyperlipidemia, chronic thrombocytopenia, recurrent UTI, sleep apnea not on CPAP who presented with altered mental status from home.  He was recently admitted here with polytrauma in April and was found to have C6 spinal fracture, multiple pelvic ring fracture and was discharged to  SNF, was discharged with hard collar.  He was recently treated for UTI.  At the SNF, patient continued to have poor oral intake, noted to have garbled speech and confused.  On presentation, CT head did not show any acute findings.  Patient was admitted for further evaluation.  Neurosurgery was consulted, no further intervention planned.  Hard collar transition to soft collar.   PT/OT recommending skilled nursing  facility on discharge.  Currently on calorie count because of poor oral intake.   Assessment & Plan:  Principal Problem:   Acute encephalopathy Active Problems:   Dysarthria   Acute kidney injury superimposed on chronic kidney disease (HCC)   C6 cervical fracture (HCC)   History of pelvic hematoma   Paroxysmal atrial fibrillation (HCC)   Closed fracture of multiple pubic rami, right, sequela   HFrEF (heart failure with reduced ejection fraction) ([place])   Controlled type 2 diabetes mellitus without complication, without long-term current use of insulin (HCC)   COPD (chronic obstructive pulmonary disease) (HCC)   S/P CABG x 3   Second degree Mobitz II AV block   Hyperlipidemia with target LDL less than 100   OSA (obstructive sleep apnea)   Thrombocytopenia (HCC)   Protein-calorie malnutrition, severe  Acute metabolic encephalopathy:  History of recent trauma, UTI which was treated with antibiotics.  Infection work-up has been negative.  Urinalysis not reassuring.  Chest x-ray did not show pneumonia.  CT head did not show any acute intracranial abnormalities.  MRI not ordered with low suspicion of stroke.  No neurological deficits.  Monitor mental status.  Delirium precautions.  Looks like he is getting delirious again.  Started on low-dose Seroquel but now stopped due sleepiness. Haldol for severe agitation only .  Dysphagia: Patient history of polytrauma with C6 fracture.  Wearing hard collar.  Dysphagia suspected to be from hard collar.  Hard collar  changed to soft collar.  Speech therapy reconsulted.  Dietitian following.  Currently on calorie count.  Family agreeable for PEG if he continues to have poor oral intake.  Also started on gentle IV fluids.  Recent history of polytrauma: Found to have C6 fracture on last admission.  Currently on soft collar.  Neurosurgery recommended outpatient follow-up.  Also found to have pelvic ring fracture with pelvic hematoma last admission.  Was residing in a nursing facility but was planned for discharge to home.  Continue pain management, supportive care.  PT/OT recommended skilled nursing facility on discharge .  Paroxysmal A-fib: Currently in normal sinus rhythm.  Eliquis was discontinued on last admission after polytrauma but has been resumed at lowe dose now.  Also on amiodarone.  Chronic systolic congestive heart failure: Euvolemic.  Takes Lasix, Entresto, Aldactone, beta-blockers at home: These are on hold.  Blood pressure soft so we will continue holding for now.  Last echocardiogram  as on 04/2020 showed EF of 35%  Diabetes type 2: On metformin  at home.  Currently on sliding scale insulin.  Monitor blood sugars  Hyperlipidemia: Takes crestor  History of depression: On Remeron, Effexor  Sick sinus syndrome: Status post pacemaker placement  OSA: Intolerant to  CPAP  Thrombocytopenia: Chronic.  Stable  CKD stage IIIb: Baseline creatinine 1.4-1.6.  Kidney function  currently at baseline    Nutrition Problem: Severe Malnutrition Etiology: chronic illness (CHF, COPD, dysphagia)    DVT prophylaxis:apixaban (ELIQUIS) tablet 2.5 mg Start: 01/09/22 2200 SCD's Start: 01/06/22 1907 apixaban (ELIQUIS) tablet 2.5 mg     Code Status: Full Code  Family Communication: discussed with daughter [name] on 01/10/22  Patient status: Inpatient  Patient is from :Home  Anticipated discharge to:SnF  Estimated DC date:Next week.  Continues to have poor oral intake, decision made to for PEG tube if needed   Consultants: None  Procedures:None  Antimicrobials:  Anti-infectives (From admission, onward)    None       Subjective:  Patient seen and examined at the bedside this morning.  He remains drowsy/sleepy today.  Not agitated.  Did not talk much.  Lies in bed, weak  Objective: Vitals:   01/11/22 0321 01/11/22 0832 01/11/22 0835 01/11/22 1024  BP: (!) 113/57  (!) 95/58 97/60  Pulse: 76  63 78  Resp: '20  16 16  '$ Temp: 98.6 F (37 C)  97.7 F (36.5 C) 97.9 F (36.6 C)  TempSrc: Axillary  Axillary Axillary  SpO2: 93% 94% 98% 94%  Weight:        Intake/Output Summary (Last 24 hours) at 01/11/2022 1054 Last data filed at 01/11/2022 0600 Gross per 24 hour  Intake 1463.89 ml  Output 1000 ml  Net 463.89 ml   Filed Weights   01/07/22 0334 01/08/22 0500 01/10/22 0500  Weight: 77.1 kg 77.2 kg 76.3 kg    Examination:  General exam: Deconditioned, weak, lying in bed HEENT: Soft collar Respiratory system:  no wheezes or crackles  Cardiovascular system: S1 & S2 heard, RRR.  Gastrointestinal system: Abdomen is nondistended, soft and nontender. Central nervous system: Not  Alert or oriented Extremities: No edema, no clubbing ,no cyanosis Skin: scattered ecchymosis     Data Reviewed: I have personally reviewed following labs and imaging  studies  CBC: Recent Labs  Lab 01/06/22 1247 01/06/22 1315 01/09/22 0330  WBC 8.5  --  6.0  NEUTROABS 6.4  --   --   HGB 11.1* 11.2* 10.9*  HCT 38.1* 33.0* 34.6*  MCV 108.5*  --  100.9*  PLT 117*  --  85*   Basic Metabolic Panel: Recent Labs  Lab 01/06/22 1247 01/06/22 1315 01/09/22 0330 01/10/22 0050 01/11/22 0047  NA 144 141 144 147* 146*  K 3.6 3.5 3.3* 3.2* 4.0  CL 112* 112* 114* 119* 119*  CO2 20*  --  '24 23 22  '$ GLUCOSE 141* 138* 113* 138* 132*  BUN 32* 33* 29* 30* 25*  CREATININE 1.66* 1.60* 1.62* 1.69* 1.63*  CALCIUM 9.0  --  8.9 8.7* 8.7*  MG  --   --   --  1.9  --      No results found for this or any previous visit (from the past 240 hour(s)).   Radiology Studies: No results found.  Scheduled Meds:  acetaminophen  1,000 mg Oral Q8H   amiodarone  200 mg Oral Daily   apixaban  2.5 mg Oral BID   azelastine  1 spray Each Nare BID   docusate sodium  100 mg Oral BID  feeding supplement  237 mL Oral TID BM   insulin aspart  0-9 Units Subcutaneous TID WC   mirtazapine  7.5 mg Oral QHS   mometasone-formoterol  2 puff Inhalation BID   nystatin  5 mL Oral QID   pantoprazole  40 mg Oral Daily   rosuvastatin  40 mg Oral QHS   venlafaxine XR  37.5 mg Oral QODAY   Continuous Infusions:  dextrose 50 mL/hr at 01/10/22 1634      LOS: 1 day   Shelly Coss, MD [facility] P5/20/2023, 10:54 AM

## 2022-01-12 DIAGNOSIS — G9340 Encephalopathy, unspecified: Secondary | ICD-10-CM | POA: Diagnosis not present

## 2022-01-12 LAB — BASIC METABOLIC PANEL
Anion gap: 4 — ABNORMAL LOW (ref 5–15)
BUN: 23 mg/dL (ref 8–23)
CO2: 23 mmol/L (ref 22–32)
Calcium: 8.5 mg/dL — ABNORMAL LOW (ref 8.9–10.3)
Chloride: 115 mmol/L — ABNORMAL HIGH (ref 98–111)
Creatinine, Ser: 1.71 mg/dL — ABNORMAL HIGH (ref 0.61–1.24)
GFR, Estimated: 39 mL/min — ABNORMAL LOW (ref >60)
Glucose, Bld: 136 mg/dL — ABNORMAL HIGH (ref 70–99)
Potassium: 3.3 mmol/L — ABNORMAL LOW (ref 3.5–5.1)
Sodium: 142 mmol/L (ref 135–145)

## 2022-01-12 LAB — GLUCOSE, CAPILLARY
Glucose-Capillary: 116 mg/dL — ABNORMAL HIGH (ref 70–99)
Glucose-Capillary: 131 mg/dL — ABNORMAL HIGH (ref 70–99)
Glucose-Capillary: 118 mg/dL — ABNORMAL HIGH (ref 70–99)
Glucose-Capillary: 110 mg/dL — ABNORMAL HIGH (ref 70–99)

## 2022-01-12 MED ORDER — POTASSIUM CHLORIDE 10 MEQ/100ML IV SOLN
10.0000 meq | INTRAVENOUS | Status: AC
  Administered 2022-01-12 (×3): 10 meq via INTRAVENOUS

## 2022-01-12 MED ORDER — CHLORHEXIDINE GLUCONATE CLOTH 2 % EX PADS
6.0000 | MEDICATED_PAD | Freq: Every day | CUTANEOUS | Status: DC
  Administered 2022-01-13 – 2022-01-21 (×8): 6 via TOPICAL

## 2022-01-12 MED ORDER — POTASSIUM CHLORIDE 10 MEQ/100ML IV SOLN
10.0000 meq | INTRAVENOUS | Status: AC
  Administered 2022-01-12: 10 meq via INTRAVENOUS
  Filled 2022-01-12 (×2): qty 100

## 2022-01-12 NOTE — Progress Notes (Addendum)
PROGRESS NOTE  ZEPPELIN COMMISSO  YBO:[id number] DOB: [birth year] DOA: 01/06/2022 PCP: [name], DO   Brief Narrative: Patient is a 88 year old male with history of paroxysmal A-fib, chronic systolic congestive heart failure, coronary artery disease status post CABG, sick sinus syndrome status post permanent pacemaker placement, COPD, diabetes type 2, hypertension, hyperlipidemia, chronic thrombocytopenia, recurrent UTI, sleep apnea not on CPAP who presented with altered mental status from home.  He was recently admitted here with polytrauma in April and was found to have C6 spinal fracture, multiple pelvic ring fracture and was discharged to  SNF, was discharged with hard collar.  He was recently treated for UTI.  At the SNF, patient continued to have poor oral intake, noted to have garbled speech and confused.  On presentation, CT head did not show any acute findings.  Patient was admitted for further evaluation.  Neurosurgery was consulted, no further intervention planned.  Hard collar transition to soft collar.   PT/OT recommending skilled nursing  facility on discharge.  Currently on calorie count because of poor oral intake.   Assessment & Plan:  Principal Problem:   Acute encephalopathy Active Problems:   Dysarthria   Acute kidney injury superimposed on chronic kidney disease (HCC)   C6 cervical fracture (HCC)   History of pelvic hematoma   Paroxysmal atrial fibrillation (HCC)   Closed fracture of multiple pubic rami, right, sequela   HFrEF (heart failure with reduced ejection fraction) ([place])   Controlled type 2 diabetes mellitus without complication, without long-term current use of insulin (HCC)   COPD (chronic obstructive pulmonary disease) (HCC)   S/P CABG x 3   Second degree Mobitz II AV block   Hyperlipidemia with target LDL less than 100   OSA (obstructive sleep apnea)   Thrombocytopenia (HCC)   Protein-calorie malnutrition, severe  Acute metabolic encephalopathy:  History of recent trauma, UTI which was treated with antibiotics.  Infection work-up has been negative.  Urinalysis not reassuring.  Chest x-ray did not show pneumonia.  CT head did not show any acute intracranial abnormalities.  MRI not ordered with low suspicion of stroke.  No neurological deficits.  Monitor mental status.  Delirium precautions.  Looks like he is getting delirious again.  Started on low-dose Seroquel but now stopped due sleepiness. Haldol for severe agitation only .  Dysphagia: Patient history of polytrauma with C6 fracture.  Wearing hard collar.  Dysphagia suspected to be from hard collar.  Hard collar  changed to soft collar.  Speech therapy reconsulted.  Dietitian following.  Currently on calorie count.  Family agreeable for PEG if he continues to have poor oral intake.  Also started on gentle IV fluids.  Recent history of polytrauma: Found to have C6 fracture on last admission.  Currently on soft collar.  Neurosurgery recommended outpatient follow-up.  Also found to have pelvic ring fracture with pelvic hematoma last admission.  Was residing in a nursing facility but was planned for discharge to home.  Continue pain management, supportive care.  PT/OT recommended skilled nursing facility on discharge .  Paroxysmal A-fib: Currently in normal sinus rhythm.  Eliquis was discontinued on last admission after polytrauma but has been resumed at lowe dose now.  Also on amiodarone.  Chronic systolic congestive heart failure: Euvolemic.  Takes Lasix, Entresto, Aldactone, beta-blockers at home: These are on hold.  Blood pressure soft so we will continue holding for now.  Last echocardiogram  as on 04/2020 showed EF of 35%  Diabetes type 2: On metformin  at home.  Currently on sliding scale insulin.  Monitor blood sugars  Hyperlipidemia: Takes crestor  History of depression: On Remeron, Effexor  Sick sinus syndrome: Status post pacemaker placement  OSA: Intolerant to  CPAP  Thrombocytopenia: Chronic.  Stable  CKD stage IIIb: Baseline creatinine 1.4-1.6.  Kidney function  currently at baseline  Hypokalemia: Supplemented with potassium  Goals of care: Palliative care closely following.  Remains full code.Palliative care again meeting with family on Monday    Nutrition Problem: Severe Malnutrition Etiology: chronic illness (CHF, COPD, dysphagia)    DVT prophylaxis:apixaban (ELIQUIS) tablet 2.5 mg Start: 01/09/22 2200 SCD's Start: 01/06/22 1907 apixaban (ELIQUIS) tablet 2.5 mg     Code Status: Full Code  Family Communication: discussed with daughter [name] on 01/10/22  Patient status: Inpatient  Patient is from :Home  Anticipated discharge to:SnF  Estimated DC date:Next week.  Continues to have poor oral intake, decision made to for PEG tube if needed.  Awaiting palliative care meeting with family   Consultants: None  Procedures:None  Antimicrobials:  Anti-infectives (From admission, onward)    None       Subjective:  Patient seen and examined the bedside this morning.  Hemodynamically stable.  Not agitated, lying in bed.  Intermittently confused.  Denies any new problems  Objective: Vitals:   01/12/22 0346 01/12/22 0500 01/12/22 0737 01/12/22 0832  BP: 106/71  101/61   Pulse:   65 61  Resp:   18 18  Temp: 98 F (36.7 C)  (!) 97.5 F (36.4 C)   TempSrc:   Oral   SpO2:   96% 98%  Weight:  77.1 kg      Intake/Output Summary (Last 24 hours) at 01/12/2022 1048 Last data filed at 01/12/2022 0500 Gross per 24 hour  Intake 475 ml  Output 900 ml  Net -425 ml   Filed Weights   01/08/22 0500 01/10/22 0500 01/12/22 0500  Weight: 77.2 kg 76.3 kg 77.1 kg    Examination:  General exam: Deconditioned, weak, lying in bed HEENT: PERRL, soft collar Respiratory system:  no wheezes or crackles  Cardiovascular system: S1 & S2 heard, RRR.  Gastrointestinal system: Abdomen is nondistended, soft and nontender. Central nervous  system: Alert and awake, oriented to place only Extremities: No edema, no clubbing ,no cyanosis Skin: Scattered ecchymosis   Data Reviewed: I have personally reviewed following labs and imaging studies  CBC: Recent Labs  Lab 01/06/22 1247 01/06/22 1315 01/09/22 0330  WBC 8.5  --  6.0  NEUTROABS 6.4  --   --   HGB 11.1* 11.2* 10.9*  HCT 38.1* 33.0* 34.6*  MCV 108.5*  --  100.9*  PLT 117*  --  85*   Basic Metabolic Panel: Recent Labs  Lab 01/06/22 1247 01/06/22 1315 01/09/22 0330 01/10/22 0050 01/11/22 0047 01/12/22 0228  NA 144 141 144 147* 146* 142  K 3.6 3.5 3.3* 3.2* 4.0 3.3*  CL 112* 112* 114* 119* 119* 115*  CO2 20*  --  '24 23 22 23  '$ GLUCOSE 141* 138* 113* 138* 132* 136*  BUN 32* 33* 29* 30* 25* 23  CREATININE 1.66* 1.60* 1.62* 1.69* 1.63* 1.71*  CALCIUM 9.0  --  8.9 8.7* 8.7* 8.5*  MG  --   --   --  1.9  --   --      No results found for this or any previous visit (from the past 240 hour(s)).   Radiology Studies: No results found.  Scheduled Meds:  acetaminophen  1,000 mg Oral Q8H   amiodarone  200 mg Oral Daily   apixaban  2.5 mg Oral BID   azelastine  1 spray Each Nare BID   docusate sodium  100 mg Oral BID   feeding supplement  237 mL Oral TID BM   insulin aspart  0-9 Units Subcutaneous TID WC   mirtazapine  7.5 mg Oral QHS   mometasone-formoterol  2 puff Inhalation BID   nystatin  5 mL Oral QID   pantoprazole  40 mg Oral Daily   rosuvastatin  40 mg Oral QHS   venlafaxine XR  37.5 mg Oral QODAY   Continuous Infusions:  dextrose 50 mL/hr at 01/12/22 0307   potassium chloride        LOS: 2 days   Shelly Coss, MD [facility] P5/21/2023, 10:48 AM

## 2022-01-12 NOTE — Plan of Care (Signed)

## 2022-01-13 ENCOUNTER — Inpatient Hospital Stay (HOSPITAL_COMMUNITY): Payer: Medicare Other

## 2022-01-13 ENCOUNTER — Ambulatory Visit: Payer: Medicare Other | Admitting: Cardiology

## 2022-01-13 DIAGNOSIS — G9340 Encephalopathy, unspecified: Secondary | ICD-10-CM | POA: Diagnosis not present

## 2022-01-13 DIAGNOSIS — R1310 Dysphagia, unspecified: Secondary | ICD-10-CM | POA: Diagnosis not present

## 2022-01-13 DIAGNOSIS — Z515 Encounter for palliative care: Secondary | ICD-10-CM | POA: Diagnosis not present

## 2022-01-13 DIAGNOSIS — R638 Other symptoms and signs concerning food and fluid intake: Secondary | ICD-10-CM | POA: Diagnosis not present

## 2022-01-13 LAB — BASIC METABOLIC PANEL
Anion gap: 4 — ABNORMAL LOW (ref 5–15)
BUN: 19 mg/dL (ref 8–23)
CO2: 21 mmol/L — ABNORMAL LOW (ref 22–32)
Calcium: 8.5 mg/dL — ABNORMAL LOW (ref 8.9–10.3)
Chloride: 113 mmol/L — ABNORMAL HIGH (ref 98–111)
Creatinine, Ser: 1.57 mg/dL — ABNORMAL HIGH (ref 0.61–1.24)
GFR, Estimated: 43 mL/min — ABNORMAL LOW (ref >60)
Glucose, Bld: 112 mg/dL — ABNORMAL HIGH (ref 70–99)
Potassium: 3.5 mmol/L (ref 3.5–5.1)
Sodium: 138 mmol/L (ref 135–145)

## 2022-01-13 LAB — PHOSPHORUS: Phosphorus: 2.2 mg/dL — ABNORMAL LOW (ref 2.5–4.6)

## 2022-01-13 LAB — GLUCOSE, CAPILLARY
Glucose-Capillary: 115 mg/dL — ABNORMAL HIGH (ref 70–99)
Glucose-Capillary: 116 mg/dL — ABNORMAL HIGH (ref 70–99)
Glucose-Capillary: 125 mg/dL — ABNORMAL HIGH (ref 70–99)
Glucose-Capillary: 126 mg/dL — ABNORMAL HIGH (ref 70–99)
Glucose-Capillary: 134 mg/dL — ABNORMAL HIGH (ref 70–99)

## 2022-01-13 LAB — MAGNESIUM: Magnesium: 1.7 mg/dL (ref 1.7–2.4)

## 2022-01-13 MED ORDER — OSMOLITE 1.5 CAL PO LIQD
1000.0000 mL | ORAL | Status: DC
  Administered 2022-01-13 – 2022-01-14 (×2): 1000 mL
  Filled 2022-01-13: qty 1000

## 2022-01-13 MED ORDER — ACETAMINOPHEN 160 MG/5ML PO SOLN
1000.0000 mg | Freq: Three times a day (TID) | ORAL | Status: DC
  Administered 2022-01-13 – 2022-01-14 (×2): 1000 mg
  Filled 2022-01-13 (×2): qty 40.6

## 2022-01-13 MED ORDER — PROSOURCE TF PO LIQD
45.0000 mL | Freq: Two times a day (BID) | ORAL | Status: DC
  Administered 2022-01-13 – 2022-01-16 (×5): 45 mL
  Filled 2022-01-13 (×5): qty 45

## 2022-01-13 MED ORDER — ONDANSETRON HCL 4 MG/2ML IJ SOLN
4.0000 mg | Freq: Four times a day (QID) | INTRAMUSCULAR | Status: DC | PRN
  Administered 2022-01-13 – 2022-01-21 (×2): 4 mg via INTRAVENOUS
  Filled 2022-01-13 (×2): qty 2

## 2022-01-13 MED ORDER — POTASSIUM CHLORIDE CRYS ER 20 MEQ PO TBCR
40.0000 meq | EXTENDED_RELEASE_TABLET | Freq: Once | ORAL | Status: AC
  Administered 2022-01-13: 40 meq via ORAL
  Filled 2022-01-13: qty 2

## 2022-01-13 NOTE — Procedures (Signed)
Cortrak  Person Inserting Tube:  Alroy Dust, Charlane Westry L, RD Tube Type:  Cortrak - 43 inches Tube Size:  10 Tube Location:  Left nare Secured by: Bridle Technique Used to Measure Tube Placement:  Marking at nare/corner of mouth Cortrak Secured At:  70 cm  Cortrak Tube Team Note:  Consult received to place a Cortrak feeding tube.   X-ray is required, abdominal x-ray has been ordered by the Cortrak team. Please confirm tube placement before using the Cortrak tube.   If the tube becomes dislodged please keep the tube and contact the Cortrak team at [email or web address] ([email or web address]) for replacement.  If after hours and replacement cannot be delayed, place a NG tube and confirm placement with an abdominal x-ray.    Hermina Barters RD, LDN Clinical Dietitian See Shea Evans for contact information.

## 2022-01-13 NOTE — Progress Notes (Signed)
Calorie Count Note: Day 2 Results  48-hour calorie count ordered. Calorie count was started on 01/09/22 at 1200 with lunch meal. Please see day 2 results below.  Diet: dysphagia 2 with thin liquids Supplements:  - Ensure Enlive TID - Magic Cup TID  Day 2: 5/19 Lunch: 100 kcal, 0 grams of protein 5/19 Dinner: 75 kcal, 1 grams of protein 5/20 Breakfast: no information available for review Supplements: 0 kcal, 0 grams of protein (pt refusing supplements)  Day 2 total 24-hour intake: 175 kcal (10% of minimum estimated needs)  1 gram of protein (1% of minimum estimated needs)  Nutrition Diagnosis:  Severe Malnutrition related to chronic illness (CHF, COPD, dysphagia) as evidenced by severe fat depletion, severe muscle depletion, percent weight loss (14.1% weight loss in less than 7 months).  Goal: Patient will meet greater than or equal to 90% of their needs  Intervention:  - d/c calorie count - Continue Ensure Enlive TID - Continue Magic Cup TID - Continue feeding assistance with all meals - Will follow for [place] discussions regarding enteral nutrition as pt has been consistently unable to meet his nutritional needs via PO intake alone   Gustavus Bryant, MS, RD, LDN Inpatient Clinical Dietitian Please see AMiON for contact information.

## 2022-01-13 NOTE — Progress Notes (Signed)
Patient ID: [name], male   DOB: 01/12/1933, 88 y.o.   MRN: [id number]    Progress Note from the Palliative Medicine Team at [facility]   Patient Name: [name]        Date: 01/13/2022 DOB: November 20, 1932  Age: 88 y.o. MRN#: [id number] Attending Physician: Shelly Coss, MD Primary Care Physician: [name], DO Admit Date: 01/06/2022   Medical records reviewed   88 y.o. male   admitted on 01/06/2022 with  past medical history significant of PAF on Eliquis, HFrEF (EF 35% by TTE 04/25/2020), CAD s/p CABG, Mobitz 2 second-degree AV block s/p PPM, COPD, T2DM, HTN, HLD, thrombocytopenia, recurrent UTIs, OSA not tolerating CPAP,  AMS    He was going to be discharged home from the SNF after rehab but was altered and had slurred speech. He fell 3 weeks ago and has 3 fractures in his ramus and a cervical fracture. Admitted to trauma service at [facility] from 4/18-4/25. Discharged to SNF for rehab.    His daughter states that his speech has been gargled for a few days-weeks. He has had poor PO intake since being at the SNF. He was alert, but not oriented which is not his baseline. He would also say some things that didn't make sense. They called his daughter who is an ICU nurse and advised they call EMS.    Baseline A&O x 4. Recently treated for UTI and finished abx yesterday.   This NP visited patient at the bedside to meet with daughter/ [name] as scheduled for  continued conversation regarding current medical situation and to discuss decisions related to treatment option, advanced directives and anticipatory care needs.  Continued concern that the patient is not eating and drinking enough to support himself from a nutritional standpoint.[name] understands; family gathered over the weekend and they are requesting a trial of artificial feeding.  They are open to PEG placement.  Education offered on risks and benefits of PEG tube in an 88 year old patient with multiple co-morbidities to  include cognitive decline   Plan of Care: -Full Code     - Educated family to consider DNR/DNI status understanding evidenced based poor outcomes in similar hospitalized patient, as the cause of arrest is likely associated with advanced chronic illness rather than an easily reversible acute cardio-pulmonary event.  -Family is open to all offered and available medical interventions to prolong life;   treat the treatable and hope for improvement and ultimately return home.         -Family requesting artificial feeding/PEG.  -Underlying chronic pain-Tylenol 1000 mg p.o. every 8 hours scheduled/discussed with daughter and she agrees -Continue with PT and OT, evaluate for transition of care closer to discharge, family would consider SNF depending on facility    Education offered on concept specific to adult failure to thrive, this may be [name] new baseline  Education offered today regarding  the importance of continued conversation with family and the medical providers regarding overall plan of care and treatment options,  ensuring decisions are within the context of the patients values and GOCs.  Questions and concerns addressed   Discussed with Dr Tawanna Solo and [facility] team via secure chat  PMT will continue support holistically    Wadie Lessen NP  Palliative Medicine Team Team Phone # [phone] Pager [phone]

## 2022-01-13 NOTE — Progress Notes (Signed)
Brief Nutrition Note  Received consult for enteral nutrition initiation and management. Per Palliative Medicine Team, plan is for Cortrak placement while awaiting PEG tube placement. Cortrak order in place.  RD will order previously recommended tube feeding regimen: - Start Osmolite 1.5 @ 20 ml/hr and advance by 10 ml q 8 hours to goal rate of 50 ml/hr (1200 ml/day) via Cortrak - ProSource TF 45 ml BID  Tube feeding regimen at goal rate provides 1880 kcal, 97 grams of protein, and 914 ml of H2O.  Monitor magnesium, potassium, and phosphorus BID for at least 3 days, MD to replete as needed, as pt is at risk for refeeding syndrome given severe malnutrition, poor PO intake since admission (x 7 days).  RD will complete full follow-up assessment tomorrow, 01/14/22.   Gustavus Bryant, MS, RD, LDN Inpatient Clinical Dietitian Please see AMiON for contact information.

## 2022-01-13 NOTE — Progress Notes (Signed)
Physical Therapy Treatment Patient Details Name: [name] MRN: [id number] DOB: 09-20-1932 Today's Date: 01/13/2022   History of Present Illness Pt is an 88 year old male admitted 01/06/22 from SNF with AMS and slurred speech. Of note: recent admission 4/18-4/25 with C6 fx and pubic rami fx nonoperative mgmt. PMH includes: CAD, COPD, CVA, GERD, HTN, hx of CABG, PSA, a fib, hx of prostate cancer, DM, vertigo.    PT Comments    Pt limited by pain and fatigue during session. Pt attempts 2 sit to stand with significant encouragement. Pt with persistent left lateral lean. Pt is very anxious about potential for falling, even with rolling in bed with all bed rails up. Pt participates well in LE HEP and will benefit from continued attempts at exercise to improve strength and endurance. PT continues to recommend SNF placement at this time.  Recommendations for follow up therapy are one component of a multi-disciplinary discharge planning process, led by the attending physician.  Recommendations may be updated based on patient status, additional functional criteria and insurance authorization.  Follow Up Recommendations  Skilled nursing-short term rehab (<3 hours/day) (HHPT if family elect to D/C home)     Assistance Recommended at Discharge Frequent or constant Supervision/Assistance  Patient can return home with the following Two people to help with walking and/or transfers;Two people to help with bathing/dressing/bathroom;Assistance with cooking/housework;Assistance with feeding;Direct supervision/assist for medications management;Direct supervision/assist for financial management;Assist for transportation;Help with stairs or ramp for entrance   Equipment Recommendations  None recommended by PT    Recommendations for Other Services       Precautions / Restrictions Precautions Precautions: Fall;Cervical Precaution Booklet Issued: No Precaution Comments: watch BP Required Braces or Orthoses:  Cervical Brace Cervical Brace: Soft collar;For comfort Restrictions Weight Bearing Restrictions: Yes RLE Weight Bearing: Weight bearing as tolerated LLE Weight Bearing: Weight bearing as tolerated     Mobility  Bed Mobility Overal bed mobility: Needs Assistance Bed Mobility: Supine to Sit, Sit to Supine     Supine to sit: Mod assist, HOB elevated Sit to supine: Max assist, HOB elevated        Transfers Overall transfer level: Needs assistance Equipment used: Rolling walker (2 wheels) Transfers: Sit to/from Stand Sit to Stand: Max assist           General transfer comment: pt stands x2, maxA, left lateral lean    Ambulation/Gait                   Stairs             Wheelchair Mobility    Modified Rankin (Stroke Patients Only)       Balance Overall balance assessment: Needs assistance Sitting-balance support: Single extremity supported, Bilateral upper extremity supported, Feet supported Sitting balance-Leahy Scale: Poor Sitting balance - Comments: epersistent left lean Postural control: Left lateral lean Standing balance support: Bilateral upper extremity supported Standing balance-Leahy Scale: Poor Standing balance comment: left lean, mod-maxA                            Cognition Arousal/Alertness: Awake/alert Behavior During Therapy: Anxious Overall Cognitive Status: Impaired/Different from baseline Area of Impairment: Orientation, Attention, Memory, Following commands, Safety/judgement, Awareness, Problem solving                 Orientation Level: Disoriented to, Time Current Attention Level: Sustained Memory: Decreased recall of precautions, Decreased short-term memory Following Commands: Follows one step commands with increased  time Safety/Judgement: Decreased awareness of safety, Decreased awareness of deficits Awareness: Intellectual Problem Solving: Slow processing, Difficulty sequencing           Exercises General Exercises - Lower Extremity Ankle Circles/Pumps: AROM, Both, 15 reps Gluteal Sets: AROM, Both, 10 reps Heel Slides: AROM, AAROM, Both, 10 reps Straight Leg Raises: AROM, Both, 10 reps    General Comments General comments (skin integrity, edema, etc.): VSS on RA      Pertinent Vitals/Pain Pain Assessment Pain Assessment: Faces Faces Pain Scale: Hurts even more Pain Location: legs Pain Descriptors / Indicators: Grimacing Pain Intervention(s): Monitored during session    Home Living                          Prior Function            PT Goals (current goals can now be found in the care plan section) Acute Rehab PT Goals Patient Stated Goal: to eventually return home Progress towards PT goals: Not progressing toward goals - comment (limited by pain and AMS)    Frequency    Min 3X/week      PT Plan Current plan remains appropriate    Co-evaluation              AM-PAC PT "6 Clicks" Mobility   Outcome Measure  Help needed turning from your back to your side while in a flat bed without using bedrails?: A Lot Help needed moving from lying on your back to sitting on the side of a flat bed without using bedrails?: A Lot Help needed moving to and from a bed to a chair (including a wheelchair)?: Total Help needed standing up from a chair using your arms (e.g., wheelchair or bedside chair)?: A Lot Help needed to walk in hospital room?: Total Help needed climbing 3-5 steps with a railing? : Total 6 Click Score: 9    End of Session Equipment Utilized During Treatment: Cervical collar Activity Tolerance: Patient tolerated treatment well Patient left: in bed;with call bell/phone within reach;with bed alarm set;with family/visitor present Nurse Communication: Mobility status;Need for lift equipment PT Visit Diagnosis: History of falling (Z91.81);Pain;Muscle weakness (generalized) (M62.81);Unsteadiness on feet (R26.81) Pain - Right/Left:  Right Pain - part of body: Hip     Time: 1287-8676 PT Time Calculation (min) (ACUTE ONLY): 35 min  Charges:  $Therapeutic Exercise: 8-22 mins $Therapeutic Activity: 8-22 mins                     Zenaida Niece, PT, DPT Acute Rehabilitation Pager: [phone] Office [place] 01/13/2022, 12:38 PM

## 2022-01-13 NOTE — Care Management Important Message (Signed)
Important Message  Patient Details  Name: [name] MRN: [id number] Date of Birth: [birth year]   Medicare Important Message Given:  Yes     Orbie Pyo 01/13/2022, 3:41 PM

## 2022-01-13 NOTE — Progress Notes (Signed)
PROGRESS NOTE  [name]  QQV:[id number] DOB: [birth year] DOA: 01/06/2022 PCP: [name], DO   Brief Narrative: Patient is a 88 year old male with history of paroxysmal A-fib, chronic systolic congestive heart failure, coronary artery disease status post CABG, sick sinus syndrome status post permanent pacemaker placement, COPD, diabetes type 2, hypertension, hyperlipidemia, chronic thrombocytopenia, recurrent UTI, sleep apnea not on CPAP who presented with altered mental status from home.  He was recently admitted here with polytrauma in April and was found to have C6 spinal fracture, multiple pelvic ring fracture and was discharged to  SNF, was discharged with hard collar.  He was recently treated for UTI.  At the SNF, patient continued to have poor oral intake, noted to have garbled speech and confused.  On presentation, CT head did not show any acute findings.  Patient was admitted for further evaluation.  Neurosurgery was consulted, no further intervention planned.  Hard collar transition to soft collar.   PT/OT recommending skilled nursing  facility on discharge.  Waiting for palliative care meeting with family today for decision on goals of care, route of feeding because of very poor oral intake.  Assessment & Plan:  Principal Problem:   Acute encephalopathy Active Problems:   Dysarthria   Acute kidney injury superimposed on chronic kidney disease (HCC)   C6 cervical fracture (HCC)   History of pelvic hematoma   Paroxysmal atrial fibrillation (HCC)   Closed fracture of multiple pubic rami, right, sequela   HFrEF (heart failure with reduced ejection fraction) ([place])   Controlled type 2 diabetes mellitus without complication, without long-term current use of insulin (HCC)   COPD (chronic obstructive pulmonary disease) (HCC)   S/P CABG x 3   Second degree Mobitz II AV block   Hyperlipidemia with target LDL less than 100   OSA (obstructive sleep apnea)   Thrombocytopenia  (HCC)   Protein-calorie malnutrition, severe  Acute metabolic encephalopathy: History of recent trauma, UTI which was treated with antibiotics.  Infection work-up has been negative.  Urinalysis not reassuring.  Chest x-ray did not show pneumonia.  CT head did not show any acute intracranial abnormalities.  MRI not ordered with low suspicion of stroke.  No neurological deficits.  Monitor mental status.  Delirium precautions.   Started on low-dose Seroquel but now stopped due sleepiness. Haldol for severe agitation only .  Mental status has remained stable since last few days, remains intermittently confused  Dysphagia: Patient history of polytrauma with C6 fracture.  Wearing hard collar.  Dysphagia suspected to be from hard collar.  Hard collar  changed to soft collar.  Speech therapy reconsulted.  Dietitian following.  Was on calorie count. Very poor oral intake.  Dietitian recommending artificial feeding through core track or PEG. family agreeable for PEG if he continues to have poor oral intake.  Also started on gentle IV fluids.  Awaiting palliative care meeting with family  Recent history of polytrauma: Found to have C6 fracture on last admission.  Currently on soft collar.  Neurosurgery recommended outpatient follow-up.  Also found to have pelvic ring fracture with pelvic hematoma last admission.  Was residing in a nursing facility but was planned for discharge to home.  Continue pain management, supportive care.  PT/OT recommended skilled nursing facility on discharge .  Paroxysmal A-fib: Currently in normal sinus rhythm.  Eliquis was discontinued on last admission after polytrauma but has been resumed at lowe dose now.  Also on amiodarone.  Chronic systolic congestive heart failure: Euvolemic.  Takes Lasix, Entresto, Aldactone, beta-blockers at home: These are on hold.  Blood pressure soft so we will continue holding for now.  Last echocardiogram  as on 04/2020 showed EF of 35%  Diabetes type 2:  On metformin at home.  Currently on sliding scale insulin.  Monitor blood sugars  Hyperlipidemia: Takes crestor  History of depression: On Remeron, Effexor  Sick sinus syndrome: Status post pacemaker placement  OSA: Intolerant to CPAP  Thrombocytopenia: Chronic.  Stable  CKD stage IIIb: Baseline creatinine 1.4-1.6.  Kidney function  currently at baseline  Hypokalemia: Supplemented with potassium  Goals of care: Palliative care closely following.  Remains full code.Palliative care again meeting with family today    Nutrition Problem: Severe Malnutrition Etiology: chronic illness (CHF, COPD, dysphagia)    DVT prophylaxis:apixaban (ELIQUIS) tablet 2.5 mg Start: 01/09/22 2200 SCD's Start: 01/06/22 1907 apixaban (ELIQUIS) tablet 2.5 mg     Code Status: Full Code  Family Communication: discussed with daughter [name] on 01/10/22  Patient status: Inpatient  Patient is from :Home  Anticipated discharge to:SnF  Estimated DC date:  Continues to have poor oral intake, decision made to for PEG tube if needed.  Awaiting palliative care meeting with family   Consultants: None  Procedures:None  Antimicrobials:  Anti-infectives (From admission, onward)    None       Subjective:  Patient seen and examined the bedside this morning.  Today he looks comfortable, lying on bed.  Not agitated.  Alert, communicates but not oriented to time.  Continues to have very poor oral intake.  Objective: Vitals:   01/13/22 0331 01/13/22 0500 01/13/22 0740 01/13/22 0854  BP: 124/65  114/68   Pulse: (!) 58  61 64  Resp: 20  (!) 22 16  Temp: 98.2 F (36.8 C)  98 F (36.7 C)   TempSrc: Oral     SpO2:   100% 95%  Weight:  79.1 kg      Intake/Output Summary (Last 24 hours) at 01/13/2022 1017 Last data filed at 01/13/2022 0326 Gross per 24 hour  Intake 520 ml  Output 1000 ml  Net -480 ml   Filed Weights   01/10/22 0500 01/12/22 0500 01/13/22 0500  Weight: 76.3 kg 77.1 kg 79.1 kg     Examination:  General exam: Overall comfortable, not in distress, very deconditioned elderly patient HEENT: PERRL, soft cervical collar Respiratory system:  no wheezes or crackles  Cardiovascular system: S1 & S2 heard, RRR.  Gastrointestinal system: Abdomen is nondistended, soft and nontender. Central nervous system: Alert and awake, not oriented to time extremities: No edema, no clubbing ,no cyanosis Skin: No rashes, no ulcers,no icterus    Data Reviewed: I have personally reviewed following labs and imaging studies  CBC: Recent Labs  Lab 01/06/22 1247 01/06/22 1315 01/09/22 0330  WBC 8.5  --  6.0  NEUTROABS 6.4  --   --   HGB 11.1* 11.2* 10.9*  HCT 38.1* 33.0* 34.6*  MCV 108.5*  --  100.9*  PLT 117*  --  85*   Basic Metabolic Panel: Recent Labs  Lab 01/09/22 0330 01/10/22 0050 01/11/22 0047 01/12/22 0228 01/13/22 0811  NA 144 147* 146* 142 138  K 3.3* 3.2* 4.0 3.3* 3.5  CL 114* 119* 119* 115* 113*  CO2 '24 23 22 23 '$ 21*  GLUCOSE 113* 138* 132* 136* 112*  BUN 29* 30* 25* 23 19  CREATININE 1.62* 1.69* 1.63* 1.71* 1.57*  CALCIUM 8.9 8.7* 8.7* 8.5* 8.5*  MG  --  1.9  --   --   --      No results found for this or any previous visit (from the past 240 hour(s)).   Radiology Studies: No results found.  Scheduled Meds:  acetaminophen  1,000 mg Oral Q8H   amiodarone  200 mg Oral Daily   apixaban  2.5 mg Oral BID   azelastine  1 spray Each Nare BID   Chlorhexidine Gluconate Cloth  6 each Topical Daily   docusate sodium  100 mg Oral BID   feeding supplement  237 mL Oral TID BM   insulin aspart  0-9 Units Subcutaneous TID WC   mirtazapine  7.5 mg Oral QHS   mometasone-formoterol  2 puff Inhalation BID   nystatin  5 mL Oral QID   pantoprazole  40 mg Oral Daily   rosuvastatin  40 mg Oral QHS   venlafaxine XR  37.5 mg Oral QODAY   Continuous Infusions:  dextrose 50 mL/hr at 01/13/22 0053      LOS: 3 days   Shelly Coss, MD [facility] P5/22/2023, 10:17 AM

## 2022-01-13 NOTE — Progress Notes (Signed)
Speech Language Pathology Treatment: Dysphagia;Cognitive-Linquistic  Patient Details Name: [name] MRN: [id number] DOB: 1932-11-26 Today's Date: 01/13/2022 Time: 5035-4656 SLP Time Calculation (min) (ACUTE ONLY): 17 min  Assessment / Plan / Recommendation Clinical Impression  Pt seen for dysphagia treatment with min verbal cues provided by SLP for intake of puree/thin liquids via straw with pt refusing placement of dentures during trial and various consistencies.  Oral propulsion/swallow initiation adequate during intake with verbal cues for small sips given minimally during trial.  Limited PO intake d/t pt refusal during session stating he was nauseous.  Hyperactive gag noted and reported by daughter.  Pt's vocal quality and intelligibility improved overall with moistening of oral mucosa/oral cavity. Continue current diet with ST f/u for diet tolerance/education for safe swallowing implementation.    Pt able to answer simple orientation questions and follow most 2-step commands during intake of limited consistencies with 75% accuracy noted for following 2-step commands with min A/encouragement. Pt's cognition appeared improved overall vs last session in the areas of attention for sustained tasks and orientation (x4).  Pt's hearing acuity impacts overall comprehension.   Pt with min impulsivity without min verbal cues provided during intake.  Continued cognitive assessment recommended as pt progresses.  HPI HPI: [name] is a 88 y.o. male who presented with AMS and slurred speech.  CXR and Head CT 5/15 negative for acute findings.  Pt recently admitted for fall 4/18-4/25 with C5 and ramus fractures and d/c'd to SNF.  He has had poor PO intake since being at the SNF. On admission, he was alert, but not oriented which is not his baseline. Baseline A&O x 4. Pt recently treated for UTI.  PMH: PAF on Eliquis, HFrEF (EF 35% by TTE 04/25/2020), CAD s/p CABG, Mobitz 2 second-degree AV block s/p PPM,  COPD, T2DM, HTN, HLD, thrombocytopenia, recurrent UTIs, OSA not tolerating CPAP. SLP evaluation 12/15/21: articulatory imprecision with reduced speech intelligibility, cognitive impairments noted in temporal orientation, memory, awareness, attention, and executive function; ST f/u for cog/dysphagia tx.      SLP Plan  Continue with current plan of care      Recommendations for follow up therapy are one component of a multi-disciplinary discharge planning process, led by the attending physician.  Recommendations may be updated based on patient status, additional functional criteria and insurance authorization.    Recommendations  Diet recommendations: Dysphagia 2 (fine chop);Thin liquid Liquids provided via: Cup;Straw Medication Administration: Whole meds with puree Supervision: Patient able to self feed;Staff to assist with self feeding;Full supervision/cueing for compensatory strategies Compensations: Small sips/bites;Slow rate;Minimize environmental distractions Postural Changes and/or Swallow Maneuvers: Seated upright 90 degrees (or as high as tolerated)                Oral Care Recommendations: Oral care QID Follow Up Recommendations: Skilled nursing-short term rehab (<3 hours/day) Assistance recommended at discharge: Frequent or constant Supervision/Assistance SLP Visit Diagnosis: Dysphagia, unspecified (R13.10);Cognitive communication deficit (C12.751) Plan: Continue with current plan of care           Elvina Sidle, M.S., CCC-SLP  01/13/2022, 4:51 PM

## 2022-01-14 DIAGNOSIS — G9340 Encephalopathy, unspecified: Secondary | ICD-10-CM | POA: Diagnosis not present

## 2022-01-14 LAB — MAGNESIUM
Magnesium: 1.6 mg/dL — ABNORMAL LOW (ref 1.7–2.4)
Magnesium: 2.1 mg/dL (ref 1.7–2.4)

## 2022-01-14 LAB — GLUCOSE, CAPILLARY
Glucose-Capillary: 135 mg/dL — ABNORMAL HIGH (ref 70–99)
Glucose-Capillary: 139 mg/dL — ABNORMAL HIGH (ref 70–99)
Glucose-Capillary: 140 mg/dL — ABNORMAL HIGH (ref 70–99)
Glucose-Capillary: 150 mg/dL — ABNORMAL HIGH (ref 70–99)
Glucose-Capillary: 170 mg/dL — ABNORMAL HIGH (ref 70–99)
Glucose-Capillary: 147 mg/dL — ABNORMAL HIGH (ref 70–99)

## 2022-01-14 LAB — PHOSPHORUS
Phosphorus: 2.3 mg/dL — ABNORMAL LOW (ref 2.5–4.6)
Phosphorus: 2.5 mg/dL (ref 2.5–4.6)

## 2022-01-14 MED ORDER — K PHOS MONO-SOD PHOS DI & MONO 155-852-130 MG PO TABS
500.0000 mg | ORAL_TABLET | Freq: Three times a day (TID) | ORAL | Status: AC
  Administered 2022-01-14 (×3): 500 mg via ORAL
  Filled 2022-01-14 (×3): qty 2

## 2022-01-14 MED ORDER — VANCOMYCIN HCL IN DEXTROSE 1-5 GM/200ML-% IV SOLN
1000.0000 mg | INTRAVENOUS | Status: AC
  Administered 2022-01-15: 1000 mg via INTRAVENOUS
  Filled 2022-01-14: qty 200

## 2022-01-14 MED ORDER — MAGNESIUM SULFATE 2 GM/50ML IV SOLN
2.0000 g | Freq: Once | INTRAVENOUS | Status: AC
  Administered 2022-01-14: 2 g via INTRAVENOUS
  Filled 2022-01-14: qty 50

## 2022-01-14 MED ORDER — IOHEXOL 300 MG/ML  SOLN
75.0000 mL | Freq: Once | INTRAMUSCULAR | Status: AC | PRN
  Administered 2022-01-14: 75 mL

## 2022-01-14 MED ORDER — ACETAMINOPHEN 160 MG/5ML PO SOLN
1000.0000 mg | Freq: Four times a day (QID) | ORAL | Status: DC | PRN
  Administered 2022-01-14 – 2022-01-20 (×4): 1000 mg
  Filled 2022-01-14 (×5): qty 40.6
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
## 2022-01-14 NOTE — TOC Progression Note (Signed)
Transition of Care [facility]) - Progression Note    Patient Details  Name: FRENCH KENDRA MRN: [id number] Date of Birth: 08/22/1933  Transition of Care [facility]) CM/SW [place], [place] Phone Number: 01/14/2022, 2:14 PM  Clinical Narrative:     CSW was notified pt will be ready to DC possibly Thursday, planning for PEG placement tomorrow. CSW spoke with Dtr Denisea who states she believes pt will go home with dtr, [name]. They have equipment at home. [name] is an Therapist, sports and will be taking FMLA to care for him. Denisea states CSW will need to confirm plan with [name] as she is HCPOA. CSW left VM. Pt has previously been to [facility] and had a bad experience, so family prefers to take him home. CSW will update CM. TOC will continue to follow for DC.  Expected Discharge Plan: [place] Barriers to Discharge: Continued Medical Work up, Other (must enter comment) (Family confirmation)  Expected Discharge Plan and Services Expected Discharge Plan: [place] Choice: [place] arrangements for the past 2 months: Single Family Home                                       Social Determinants of Health (SDOH) Interventions    Readmission Risk Interventions     View : No data to display.

## 2022-01-14 NOTE — Progress Notes (Signed)
Occupational Therapy Treatment Patient Details Name: [name] MRN: [id number] DOB: June 05, 1933 Today's Date: 01/14/2022   History of present illness Pt is an 88 year old male admitted 01/06/22 from SNF with AMS and slurred speech. Of note: recent admission 4/18-4/25 with C6 fx and pubic rami fx nonoperative mgmt. PMH includes: CAD, COPD, CVA, GERD, HTN, hx of CABG, PSA, a fib, hx of prostate cancer, DM, vertigo.   OT comments  Daryus is incrementally progressing, session limited by lethargic which did not improve with sitting EOB. Pt was also confused throughout, and following one step commands ~50% of the time. He required max A or bed nobility and min-max A for sitting balance. Groomed with hand over hand assist, pt continues to benefit, SNF remains appropriate.   Recommendations for follow up therapy are one component of a multi-disciplinary discharge planning process, led by the attending physician.  Recommendations may be updated based on patient status, additional functional criteria and insurance authorization.    Follow Up Recommendations  Skilled nursing-short term rehab (<3 hours/day)    Assistance Recommended at Discharge Frequent or constant Supervision/Assistance  Patient can return home with the following  Two people to help with walking and/or transfers;A lot of help with bathing/dressing/bathroom;Direct supervision/assist for medications management;Direct supervision/assist for financial management;Assistance with feeding;Assist for transportation;Help with stairs or ramp for entrance   Equipment Recommendations  Wheelchair (measurements OT);Wheelchair cushion (measurements OT)       Precautions / Restrictions Precautions Precautions: Fall;Cervical Precaution Booklet Issued: No Precaution Comments: watch BP Required Braces or Orthoses: Cervical Brace Cervical Brace: Soft collar;For comfort Restrictions Weight Bearing Restrictions: Yes RLE Weight Bearing: Weight bearing  as tolerated LLE Weight Bearing: Weight bearing as tolerated       Mobility Bed Mobility Overal bed mobility: Needs Assistance Bed Mobility: Sidelying to Sit, Rolling, Sit to Sidelying Rolling: Max assist Sidelying to sit: Max assist     Sit to sidelying: Max assist      Transfers                         Balance Overall balance assessment: Needs assistance Sitting-balance support: Single extremity supported, Feet supported Sitting balance-Leahy Scale: Poor Sitting balance - Comments: mod-max A for sitting balance               ADL either performed or assessed with clinical judgement   ADL Overall ADL's : Needs assistance/impaired     Grooming: Sitting;Maximal assistance Grooming Details (indicate cue type and reason): for sitting balance, hand over hand and cues                             Functional mobility during ADLs: Maximal assistance General ADL Comments: bed level only due to sadety and pt activity resisting    Extremity/Trunk Assessment Upper Extremity Assessment Upper Extremity Assessment: RUE deficits/detail;LUE deficits/detail RUE Deficits / Details: grasp 4/5 (unable to hold utensils or food to bring to mouth), decreased shoulder flexion, pain with all movement LUE Deficits / Details: grasp 4/5 (unable to hold utensils or food to bring to mouth), decreased shoulder flexion, pain with all movement   Lower Extremity Assessment Lower Extremity Assessment: Defer to PT evaluation        Vision   Vision Assessment?: Vision impaired- to be further tested in functional context   Perception Perception Perception: Not tested   Praxis Praxis Praxis: Not tested    Cognition Arousal/Alertness: Awake/alert Behavior  During Therapy: Anxious Overall Cognitive Status: Impaired/Different from baseline Area of Impairment: Orientation, Attention, Memory, Following commands, Safety/judgement, Awareness, Problem solving                  Orientation Level: Disoriented to, Time Current Attention Level: Sustained Memory: Decreased recall of precautions, Decreased short-term memory Following Commands: Follows one step commands with increased time Safety/Judgement: Decreased awareness of safety, Decreased awareness of deficits Awareness: Intellectual Problem Solving: Slow processing, Difficulty sequencing General Comments: lethargic and confused throughout              General Comments VSS onRa    Pertinent Vitals/ Pain       Pain Assessment Pain Assessment: Faces Faces Pain Scale: Hurts little more Pain Location: neck? Pain Descriptors / Indicators: Grimacing Pain Intervention(s): Limited activity within patient's tolerance, Monitored during session   Frequency  Min 2X/week        Progress Toward Goals  OT Goals(current goals can now be found in the care plan section)  Progress towards OT goals: Progressing toward goals  Acute Rehab OT Goals Patient Stated Goal: let me lay down OT Goal Formulation: With patient Time For Goal Achievement: 01/21/22 Potential to Achieve Goals: Fair ADL Goals Pt Will Perform Eating: with modified independence;with adaptive utensils;sitting Pt Will Perform Grooming: with set-up;with adaptive equipment;sitting Pt Will Perform Lower Body Bathing: with set-up;sitting/lateral leans;sit to/from stand;with adaptive equipment Pt Will Perform Upper Body Dressing: with min assist;sitting Pt Will Perform Lower Body Dressing: with mod assist;sitting/lateral leans;with adaptive equipment Pt Will Transfer to Toilet: with mod assist;stand pivot transfer;bedside commode Pt Will Perform Toileting - Clothing Manipulation and hygiene: with max assist;sit to/from stand Pt/caregiver will Perform Home Exercise Program: Right Upper extremity;Left upper extremity;With minimal assist;With written HEP provided;Increased ROM;Increased strength Additional ADL Goal #1: Pt will perform bed  mobility and maintain sitting EOB at min A as precursor to participate in ADL  Plan Discharge plan remains appropriate       AM-PAC OT "6 Clicks" Daily Activity     Outcome Measure   Help from another person eating meals?: Total Help from another person taking care of personal grooming?: A Lot Help from another person toileting, which includes using toliet, bedpan, or urinal?: Total Help from another person bathing (including washing, rinsing, drying)?: A Lot Help from another person to put on and taking off regular upper body clothing?: Total Help from another person to put on and taking off regular lower body clothing?: Total 6 Click Score: 8    End of Session Equipment Utilized During Treatment: Cervical collar  OT Visit Diagnosis: Unsteadiness on feet (R26.81);Other abnormalities of gait and mobility (R26.89);Muscle weakness (generalized) (M62.81);History of falling (Z91.81);Feeding difficulties (R63.3);Other symptoms and signs involving cognitive function;Adult, failure to thrive (R62.7);Pain Pain - Right/Left: Left Pain - part of body: Shoulder;Hip   Activity Tolerance Patient limited by pain   Patient Left in bed;with call bell/phone within reach;with bed alarm set   Nurse Communication Mobility status;Need for lift equipment;Precautions;Other (comment)        Time: 1700-1749 OT Time Calculation (min): 15 min  Charges: OT General Charges $OT Visit: 1 Visit OT Treatments $Therapeutic Activity: 8-22 mins    Monica Zahler A Brittinee Risk 01/14/2022, 1:50 PM

## 2022-01-14 NOTE — Progress Notes (Signed)
Nutrition Follow-up  DOCUMENTATION CODES:   Severe malnutrition in context of chronic illness  INTERVENTION:   Tube feeds via Cortrak: - Continue to advance Osmolite 1.5 by 10 ml q 8 hours to goal rate of 50 ml/hr (1200 ml/day) - ProSource TF 45 ml BID  Tube feeding regimen at goal rate provides 1880 kcal, 97 grams of protein, and 914 ml of H2O.  Continue to monitor magnesium, potassium, and phosphorus BID for at least 3 days total, MD to replete as needed, as pt is at risk for refeeding syndrome given severe malnutrition, poor PO intake since admission (x 7 days).  - Continue to offer Ensure Enlive po TID, each supplement provides 350 kcal and 20 grams of protein  - Continue to offer Magic cup TID with meals, each supplement provides 290 kcal and 9 grams of protein  - Continue to encourage PO intake and provide feeding assistance  NUTRITION DIAGNOSIS:   Severe Malnutrition related to chronic illness (CHF, COPD, dysphagia) as evidenced by severe fat depletion, severe muscle depletion, percent weight loss (14.1% weight loss in less than 7 months).  Ongoing, being addressed via oral nutrition supplements and TF  GOAL:   Patient will meet greater than or equal to 90% of their needs  Progressing with advancement of TF to goal  MONITOR:   Diet advancement, Labs, Weight trends, I & O's  REASON FOR ASSESSMENT:   Consult Assessment of nutrition requirement/status, Poor PO  ASSESSMENT:   88 year old male who presented to the ED on 5/15 with AMS. PMH of PAF, CHF, CAD s/p CABG, Mobitz 2 second-degree AV block s/p PPM, COPD, T2DM, HTN, HLD, thrombocytopenia, recurrent UTIs, OSA. Pt admitted with acute encephalopathy, AKI, C6 cervical fx.  05/16 - NPO 05/17 - diet advanced to dysphagia 2 with thin liquids 05/22 - Cortrak placed (tip gastric), TF started  A 48-hour calorie count was completed and demonstrated that pt only meeting 10-20% of minimum kcal needs and 1-22% of minimum  protein needs. Cortrak placed yesterday and TF started.  Attempted to speak with pt at bedside. Pt sleeping soundly and did not awaken to RD voice. Tube feeds infusing at 40 ml/hr (rate just increased from 30 ml/hr). Pt with low phosphorus and magnesium this morning; MD ordered replacement. Pt appears to be tolerating current tube feeds without issue. Will continue with advancement toward goal rate while monitoring for refeeding. IR has been consulted for PEG tube placement.  Current TF: Osmolite 1.5 @ 40 ml/hr (goal rate 50 ml/hr), ProSource TF 45 ml BID  Admit weight: 77.1 kg Current weight: 79.6 kg  Pt with non-pitting edema to BLE.  Meal Completion: 0-10%  Medications reviewed and include: colace, Ensure Enlive TID, SSI, remeron, nystatin mouthwash, protonix, K phos 500 mg x 3 doses IVF: D5 @ 50 ml/hr  Labs reviewed: creatinine 1.57, phosphorus 2.3, magnesium 1.6 (received IV magnesium sulfate 2 grams once) CBG's: 115-139 x 24 hours  UOP: 750 ml x 24 hours  Diet Order:   Diet Order             DIET DYS 2 Room service appropriate? Yes with Assist; Fluid consistency: Thin  Diet effective now                   EDUCATION NEEDS:   Not appropriate for education at this time  Skin:  Skin Assessment: Reviewed RN Assessment (wound to scrotum)  Last BM:  01/13/22 smear  Height:   Ht Readings from Last 1 Encounters:  12/11/21 '5\' 11"'$  (1.803 m)    Weight:   Wt Readings from Last 1 Encounters:  01/14/22 79.6 kg    BMI:  Body mass index is 24.48 kg/m.  Estimated Nutritional Needs:   Kcal:  1800-2000  Protein:  95-115 grams  Fluid:  1.8 L/day    Gustavus Bryant, MS, RD, LDN Inpatient Clinical Dietitian Please see AMiON for contact information.

## 2022-01-14 NOTE — Plan of Care (Signed)

## 2022-01-14 NOTE — Progress Notes (Signed)
PROGRESS NOTE  [name]  ZYS:[id number] DOB: [birth year] DOA: 01/06/2022 PCP: [name], DO   Brief Narrative: Patient is a 88 year old male with history of paroxysmal A-fib, chronic systolic congestive heart failure, coronary artery disease status post CABG, sick sinus syndrome status post permanent pacemaker placement, COPD, diabetes type 2, hypertension, hyperlipidemia, chronic thrombocytopenia, recurrent UTI, sleep apnea not on CPAP who presented with altered mental status from home.  He was recently admitted here with polytrauma in April and was found to have C6 spinal fracture, multiple pelvic ring fracture and was discharged to  SNF, was discharged with hard collar.  He was recently treated for UTI.  At the SNF, patient continued to have poor oral intake, noted to have garbled speech and confused.  On presentation, CT head did not show any acute findings.  Patient was admitted for further evaluation.  Neurosurgery was consulted, no further intervention planned.  Hard collar transition to soft collar.   PT/OT recommending skilled nursing  facility on discharge, family interested in home health.  Palliative care closely following.  Plan for PEG placement tomorrow.  Medically stable for discharge after PEG placement  Assessment & Plan:  Principal Problem:   Acute encephalopathy Active Problems:   Dysarthria   Acute kidney injury superimposed on chronic kidney disease (HCC)   C6 cervical fracture (HCC)   History of pelvic hematoma   Paroxysmal atrial fibrillation (HCC)   Closed fracture of multiple pubic rami, right, sequela   HFrEF (heart failure with reduced ejection fraction) ([place])   Controlled type 2 diabetes mellitus without complication, without long-term current use of insulin (HCC)   COPD (chronic obstructive pulmonary disease) (HCC)   S/P CABG x 3   Second degree Mobitz II AV block   Hyperlipidemia with target LDL less than 100   OSA (obstructive sleep apnea)    Thrombocytopenia (HCC)   Protein-calorie malnutrition, severe  Acute metabolic encephalopathy: History of recent trauma, UTI which was treated with antibiotics.  Infection work-up has been negative.  Urinalysis not reassuring.  Chest x-ray did not show pneumonia.  CT head did not show any acute intracranial abnormalities.  MRI not ordered with low suspicion of stroke.  No neurological deficits.  Monitor mental status.  Delirium precautions.   Started on low-dose Seroquel but now stopped due sleepiness. Haldol for severe agitation only .  Mental status has remained stable since last few days, remains intermittently confused  Dysphagia: Patient history of polytrauma with C6 fracture.  Wearing hard collar.  Dysphagia suspected to be from hard collar.  Hard collar  changed to soft collar.  Speech therapy reconsulted.  Dietitian following.  Was on calorie count. Very poor oral intake.  Dietitian recommending artificial feeding through core track or PEG. family agreeable for PEG.  Hypomagnesis/hypophosphatemia: Being Supplemented  Recent history of polytrauma: Found to have C6 fracture on last admission.  Currently on soft collar.  Neurosurgery recommended outpatient follow-up.  Also found to have pelvic ring fracture with pelvic hematoma last admission.  Was residing in a nursing facility but was planned for discharge to home.  Continue pain management, supportive care.  PT/OT recommended skilled nursing facility on discharge .  Paroxysmal A-fib: Currently in normal sinus rhythm.  Eliquis was discontinued on last admission after polytrauma but has been resumed at lowe dose now, now on hold for PEG.  Also on amiodarone.  Chronic systolic congestive heart failure: Euvolemic.  Takes Lasix, Entresto, Aldactone, beta-blockers at home: These are on hold.  Blood  pressure soft so we will continue holding for now.  Last echocardiogram  as on 04/2020 showed EF of 35%  Diabetes type 2: On metformin at home.  Currently  on sliding scale insulin.  Monitor blood sugars  Hyperlipidemia: Takes crestor  History of depression: On Remeron, Effexor  Sick sinus syndrome: Status post pacemaker placement  OSA: Intolerant to CPAP  Thrombocytopenia: Chronic.  Stable  CKD stage IIIb: Baseline creatinine 1.4-1.6.  Kidney function  currently at baseline  Hypokalemia: Supplemented with potassium  Goals of care: Palliative care closely following.  Remains full code.Palliative care met with family.  We recommend DNR    Nutrition Problem: Severe Malnutrition Etiology: chronic illness (CHF, COPD, dysphagia)    DVT prophylaxis:SCD's Start: 01/06/22 1907     Code Status: Full Code  Family Communication: discussed with daughter [name] on 01/10/22  Patient status: Inpatient  Patient is from :Home  Anticipated discharge to:SnF/home health  Estimated DC date: On Thursday, PEG tomorrow   Consultants: None  Procedures:None  Antimicrobials:  Anti-infectives (From admission, onward)    Start     Dose/Rate Route Frequency Ordered Stop   01/15/22 0000  vancomycin (VANCOCIN) IVPB 1000 mg/200 mL premix        1,000 mg 200 mL/hr over 60 Minutes Intravenous To Radiology 01/14/22 1049 01/16/22 0000       Subjective:  Patient seen and examined at the bedside this morning.  Hemodynamically stable but very weak, lying in bed, confused.  On core track.  Appears overall comfortable  Objective: Vitals:   01/14/22 0323 01/14/22 0331 01/14/22 0730 01/14/22 0750  BP: 121/71  (!) 101/55   Pulse: 66  (!) 55 68  Resp: _0 Temp: 97.6 F (36.4 C)  98 F (36.7 C)   TempSrc: Axillary     SpO2: 98%  (!) 64% 97%  Weight:  79.6 kg      Intake/Output Summary (Last 24 hours) at 01/14/2022 1110 Last data filed at 01/14/2022 0900 Gross per 24 hour  Intake 1718.63 ml  Output 751 ml  Net 967.63 ml   Filed Weights   01/12/22 0500 01/13/22 0500 01/14/22 0331  Weight: 77.1 kg 79.1 kg 79.6 kg     Examination:  General exam: Overall comfortable, not in distress, very deconditioned, chronically ill looking elderly male HEENT: Core track Respiratory system:  no wheezes or crackles  Cardiovascular system: S1 & S2 heard, RRR.  Gastrointestinal system: Abdomen is nondistended, soft and nontender. Central nervous system: Alert and awake but not oriented Extremities: No edema, no clubbing ,no cyanosis Skin: No rashes, no ulcers,no icterus    Data Reviewed: I have personally reviewed following labs and imaging studies  CBC: Recent Labs  Lab 01/09/22 0330  WBC 6.0  HGB 10.9*  HCT 34.6*  MCV 100.9*  PLT 85*   Basic Metabolic Panel: Recent Labs  Lab 01/09/22 0330 01/10/22 0050 01/11/22 0047 01/12/22 0228 01/13/22 0811 01/13/22 1450 01/14/22 0408  NA 144 147* 146* 142 138  --   --   K 3.3* 3.2* 4.0 3.3* 3.5  --   --   CL 114* 119* 119* 115* 113*  --   --   CO2 _1 21*  --   --   GLUCOSE 113* 138* 132* 136* 112*  --   --   BUN 29* 30* 25* 23 19  --   --   CREATININE 1.62* 1.69* 1.63* 1.71* 1.57*  --   --  CALCIUM 8.9 8.7* 8.7* 8.5* 8.5*  --   --   MG  --  1.9  --   --   --  1.7 1.6*  PHOS  --   --   --   --   --  2.2* 2.3*     No results found for this or any previous visit (from the past 240 hour(s)).   Radiology Studies: DG Abd Portable 1V  Result Date: 01/13/2022 CLINICAL DATA:  Feeding tube placement EXAM: PORTABLE ABDOMEN - 1 VIEW COMPARISON:  CT scan 12/11/2021 FINDINGS: The feeding tube terminates in the antropyloric region. Pacer leads noted. Prior CABG. Atherosclerotic calcification of the aortic arch. Prominence of stool in the upper colon. IMPRESSION: 1. Pacer lead terminates in the antropyloric region of the stomach. 2.  Aortic Atherosclerosis (ICD10-I70.0). 3. Possible constipation. Electronically Signed   By: Van Clines M.D.   On: 01/13/2022 15:27    Scheduled Meds:  amiodarone  200 mg Oral Daily   azelastine  1 spray Each Nare BID    Chlorhexidine Gluconate Cloth  6 each Topical Daily   docusate sodium  100 mg Oral BID   feeding supplement  237 mL Oral TID BM   feeding supplement (PROSource TF)  45 mL Per Tube BID   insulin aspart  0-9 Units Subcutaneous TID WC   mirtazapine  7.5 mg Oral QHS   mometasone-formoterol  2 puff Inhalation BID   nystatin  5 mL Oral QID   pantoprazole  40 mg Oral Daily   phosphorus  500 mg Oral TID   rosuvastatin  40 mg Oral QHS   venlafaxine XR  37.5 mg Oral QODAY   Continuous Infusions:  dextrose 50 mL/hr at 01/14/22 0740   feeding supplement (OSMOLITE 1.5 CAL) 40 mL/hr at 01/14/22 0939   [START ON 01/15/2022] vancomycin        LOS: 4 days   Shelly Coss, MD [facility] P5/23/2023, 11:10 AM

## 2022-01-15 ENCOUNTER — Inpatient Hospital Stay (HOSPITAL_COMMUNITY): Payer: Medicare Other

## 2022-01-15 DIAGNOSIS — R638 Other symptoms and signs concerning food and fluid intake: Secondary | ICD-10-CM | POA: Diagnosis not present

## 2022-01-15 DIAGNOSIS — R1310 Dysphagia, unspecified: Secondary | ICD-10-CM | POA: Diagnosis not present

## 2022-01-15 DIAGNOSIS — G9340 Encephalopathy, unspecified: Secondary | ICD-10-CM | POA: Diagnosis not present

## 2022-01-15 DIAGNOSIS — Z515 Encounter for palliative care: Secondary | ICD-10-CM | POA: Diagnosis not present

## 2022-01-15 HISTORY — PX: IR GASTROSTOMY TUBE MOD SED: IMG625

## 2022-01-15 LAB — BASIC METABOLIC PANEL
Anion gap: 5 (ref 5–15)
BUN: 21 mg/dL (ref 8–23)
CO2: 24 mmol/L (ref 22–32)
Calcium: 8.5 mg/dL — ABNORMAL LOW (ref 8.9–10.3)
Chloride: 108 mmol/L (ref 98–111)
Creatinine, Ser: 1.34 mg/dL — ABNORMAL HIGH (ref 0.61–1.24)
GFR, Estimated: 52 mL/min — ABNORMAL LOW (ref >60)
Glucose, Bld: 122 mg/dL — ABNORMAL HIGH (ref 70–99)
Potassium: 3.8 mmol/L (ref 3.5–5.1)
Sodium: 137 mmol/L (ref 135–145)

## 2022-01-15 LAB — GLUCOSE, CAPILLARY
Glucose-Capillary: 108 mg/dL — ABNORMAL HIGH (ref 70–99)
Glucose-Capillary: 108 mg/dL — ABNORMAL HIGH (ref 70–99)
Glucose-Capillary: 109 mg/dL — ABNORMAL HIGH (ref 70–99)
Glucose-Capillary: 140 mg/dL — ABNORMAL HIGH (ref 70–99)
Glucose-Capillary: 95 mg/dL (ref 70–99)

## 2022-01-15 LAB — MAGNESIUM: Magnesium: 2 mg/dL (ref 1.7–2.4)

## 2022-01-15 MED ORDER — FENTANYL CITRATE (PF) 100 MCG/2ML IJ SOLN
INTRAMUSCULAR | Status: AC
  Filled 2022-01-15: qty 2

## 2022-01-15 MED ORDER — K PHOS MONO-SOD PHOS DI & MONO 155-852-130 MG PO TABS
500.0000 mg | ORAL_TABLET | Freq: Three times a day (TID) | ORAL | Status: AC
  Administered 2022-01-15 – 2022-01-16 (×3): 500 mg via ORAL
  Filled 2022-01-15 (×3): qty 2

## 2022-01-15 MED ORDER — LIDOCAINE HCL 1 % IJ SOLN
INTRAMUSCULAR | Status: AC
Start: 2022-01-15 — End: 2022-01-16
  Filled 2022-01-15: qty 20

## 2022-01-15 MED ORDER — MIDAZOLAM HCL 2 MG/2ML IJ SOLN
INTRAMUSCULAR | Status: AC | PRN
  Administered 2022-01-15: 1 mg via INTRAVENOUS
  Administered 2022-01-15 (×2): .5 mg via INTRAVENOUS

## 2022-01-15 MED ORDER — LIDOCAINE HCL (PF) 1 % IJ SOLN
INTRAMUSCULAR | Status: AC | PRN
  Administered 2022-01-15: 10 mL

## 2022-01-15 MED ORDER — TAMSULOSIN HCL 0.4 MG PO CAPS
0.4000 mg | ORAL_CAPSULE | Freq: Every day | ORAL | Status: DC
  Administered 2022-01-15 – 2022-01-18 (×4): 0.4 mg via ORAL
  Filled 2022-01-15 (×5): qty 1

## 2022-01-15 MED ORDER — MIDAZOLAM HCL 2 MG/2ML IJ SOLN
INTRAMUSCULAR | Status: AC
  Filled 2022-01-15: qty 2

## 2022-01-15 MED ORDER — IOHEXOL 300 MG/ML  SOLN
100.0000 mL | Freq: Once | INTRAMUSCULAR | Status: AC | PRN
  Administered 2022-01-15: 25 mL

## 2022-01-15 MED ORDER — VANCOMYCIN HCL IN DEXTROSE 1-5 GM/200ML-% IV SOLN
INTRAVENOUS | Status: AC
  Filled 2022-01-15: qty 200

## 2022-01-15 MED ORDER — FENTANYL CITRATE (PF) 100 MCG/2ML IJ SOLN
INTRAMUSCULAR | Status: AC | PRN
  Administered 2022-01-15: 25 ug via INTRAVENOUS
  Administered 2022-01-15: 50 ug via INTRAVENOUS
  Administered 2022-01-15: 25 ug via INTRAVENOUS

## 2022-01-15 MED ORDER — GLUCAGON HCL RDNA (DIAGNOSTIC) 1 MG IJ SOLR
INTRAMUSCULAR | Status: AC
  Filled 2022-01-15: qty 1

## 2022-01-15 NOTE — TOC Progression Note (Signed)
Transition of Care [facility]) - Progression Note    Patient Details  Name: [name] MRN: [id number] Date of Birth: December 18, 1932  [facility] CM/SW [place], [place] Phone Number: 01/15/2022, 2:23 PM  Clinical Narrative:    CSW spoke with dtr, [name] who confirmed plan for pt to go back to his home at DC and she will stay with him. She requests [facility] for transport home. Previously had [place] services (possibly [place]) she would like those renewed. States they may need a hoyer lift. CM updated of these needs. Dtr concerned about pt Dcing tomorrow as he is having his PEG placed today and will still need a voiding trial. CSW noted she would document concerns. TOC will continue to follow for DC needs.   Expected Discharge Plan: [place] Barriers to Discharge: Continued Medical Work up, Other (must enter comment) (Family confirmation)  Expected Discharge Plan and Services Expected Discharge Plan: [place] Choice: [place] arrangements for the past 2 months: Single Family Home                                       Social Determinants of Health (SDOH) Interventions    Readmission Risk Interventions     View : No data to display.

## 2022-01-15 NOTE — Procedures (Signed)
Interventional Radiology Procedure Note  Procedure: Percutaneous gastrostomy tube placement  Complications: None  Estimated Blood Loss: < 10 mL  Findings: 20 Fr bumper retention gastrostomy tube placed with tip in body of stomach. OK to use in 24 hours.  Nile Prisk T. Deetra Booton, M.D Pager:  [phone]   

## 2022-01-15 NOTE — Progress Notes (Signed)
PROGRESS NOTE  [name]  MRN:[id number] DOB: 04/25/1933 DOA: 01/06/2022 PCP: Gottschalk, Ashly M, DO   Brief Narrative: Patient is a 88-year-old male with history of paroxysmal A-fib, chronic systolic congestive heart failure, coronary artery disease status post CABG, sick sinus syndrome status post permanent pacemaker placement, COPD, diabetes type 2, hypertension, hyperlipidemia, chronic thrombocytopenia, recurrent UTI, sleep apnea not on CPAP who presented with altered mental status from home.  He was recently admitted here with polytrauma in April and was found to have C6 spinal fracture, multiple pelvic ring fracture and was discharged to  SNF, was discharged with hard collar.  He was recently treated for UTI.  At the SNF, patient continued to have poor oral intake, noted to have garbled speech and confused.  On presentation, CT head did not show any acute findings.  Patient was admitted for further evaluation.  Neurosurgery was consulted, no further intervention planned.  Hard collar transition to soft collar.   PT/OT recommending skilled nursing  facility on discharge.  Palliative care is also closely following.  Plan for PEG placement today.  Medically stable for discharge after PEG placement/likely tomorrow to [facility] Home with HH  Assessment & Plan:  Principal Problem:   Acute encephalopathy Active Problems:   Dysarthria   Acute kidney injury superimposed on chronic kidney disease (HCC)   C6 cervical fracture (HCC)   History of pelvic hematoma   Paroxysmal atrial fibrillation (HCC)   Closed fracture of multiple pubic rami, right, sequela   HFrEF (heart failure with reduced ejection fraction) (HCC)   Controlled type 2 diabetes mellitus without complication, without long-term current use of insulin (HCC)   COPD (chronic obstructive pulmonary disease) (HCC)   S/P CABG x 3   Second degree Mobitz II AV block   Hyperlipidemia with target LDL less than 100   OSA (obstructive sleep  apnea)   Thrombocytopenia (HCC)   Protein-calorie malnutrition, severe  Acute metabolic encephalopathy: History of recent trauma, UTI which was treated with antibiotics.  Infection work-up has been negative.  Urinalysis not reassuring.  Chest x-ray did not show pneumonia.  CT head did not show any acute intracranial abnormalities.  MRI not ordered with low suspicion of stroke.  No neurological deficits.  Monitor mental status.  Delirium precautions.   Started on low-dose Seroquel but now stopped due sleepiness. Haldol for severe agitation only .  Mental status has remained stable since last few days, remains intermittently confused  Dysphagia: Patient history of polytrauma with C6 fracture.  Wearing hard collar.  Dysphagia suspected to be from hard collar.  Hard collar  changed to soft collar.  Speech therapy reconsulted.  Dietitian following.  Was on calorie count. Very poor oral intake.  Dietitian recommending artificial feeding through core track or PEG. family agreeable for PEG.  Hypomagnesis/hypophosphatemia: Being Supplemented and monitored  Recent history of polytrauma: Found to have C6 fracture on last admission.  Currently on soft collar.  Neurosurgery recommended outpatient follow-up.  Also found to have pelvic ring fracture with pelvic hematoma last admission.  Was residing in a nursing facility but was planned for discharge to home.  Continue pain management, supportive care.  PT/OT recommended skilled nursing facility on discharge .  Paroxysmal A-fib: Currently in normal sinus rhythm.  Eliquis was discontinued on last admission after polytrauma but has been resumed at lowe dose now, now on hold for PEG,resume tomorrow.  Also on amiodarone.  Chronic systolic congestive heart failure: Euvolemic.  Takes Lasix, Entresto, Aldactone, beta-blockers at   home: These are on hold.  Blood pressure soft so we will continue holding for now.  Last echocardiogram  as on 04/2020 showed EF of 35%  Diabetes  type 2: On metformin at home.  Currently on sliding scale insulin.  Monitor blood sugars  Hyperlipidemia: Takes crestor  History of depression: On Remeron, Effexor  Sick sinus syndrome: Status post pacemaker placement  OSA: Intolerant to CPAP  Thrombocytopenia: Chronic.  Stable  CKD stage IIIb: Baseline creatinine 1.4-1.6.  Kidney function  currently at baseline  Urinary retention: Foley placed, will give voiding trial.  Started on tamsulosin  Goals of care: Palliative care closely following.  Remains full code.Palliative care met with family.  We recommend DNR.  PT/OT recommending skilled facility on discharge.  Family will soon decide on whether to go to skilled facility or to home with home health    Nutrition Problem: Severe Malnutrition Etiology: chronic illness (CHF, COPD, dysphagia)    DVT prophylaxis:SCD's Start: 01/06/22 1907     Code Status: Full Code  Family Communication: discussed with daughter [name] on 5/24  Patient status: Inpatient  Patient is from :Home  Anticipated discharge to:SNF vs HH  Estimated DC date: On Thursday, PEG today   Consultants: None  Procedures:None  Antimicrobials:  Anti-infectives (From admission, onward)    Start     Dose/Rate Route Frequency Ordered Stop   01/15/22 0600  vancomycin (VANCOCIN) IVPB 1000 mg/200 mL premix        1,000 mg 200 mL/hr over 60 Minutes Intravenous To Radiology 01/14/22 1049 01/16/22 0600       Subjective:  Patient seen and examined at bedside this morning.  Hemodynamically stable.  Lying on bed like yesterday.  Very weak, deconditioned, confused but not agitated.  Not in any kind of distress.  Denies any new complaints  Objective: Vitals:   01/14/22 2326 01/15/22 0351 01/15/22 0742 01/15/22 0829  BP: 111/69 116/60 128/65   Pulse: 85 66 70 70  Resp: _0 Temp: 98.5 F (36.9 C) 97.6 F (36.4 C) 97.8 F (36.6 C)   TempSrc: Oral Oral Axillary   SpO2: 92% 97% 99%   Weight:  79.7  kg      Intake/Output Summary (Last 24 hours) at 01/15/2022 1120 Last data filed at 01/15/2022 0500 Gross per 24 hour  Intake 1148.23 ml  Output 975 ml  Net 173.23 ml   Filed Weights   01/13/22 0500 01/14/22 0331 01/15/22 0351  Weight: 79.1 kg 79.6 kg 79.7 kg    Examination:   General exam: Overall comfortable, not in distress,confused,lying on bed, deconditioned elderly male HEENT: Core track Respiratory system:  no wheezes or crackles  Cardiovascular system: S1 & S2 heard, RRR.  Gastrointestinal system: Abdomen is nondistended, soft and nontender. Central nervous system: Alert and awake but not oriented Extremities: No edema, no clubbing ,no cyanosis Skin: No rashes, no ulcers,no icterus     GU: Foley  Data Reviewed: I have personally reviewed following labs and imaging studies  CBC: Recent Labs  Lab 01/09/22 0330  WBC 6.0  HGB 10.9*  HCT 34.6*  MCV 100.9*  PLT 85*   Basic Metabolic Panel: Recent Labs  Lab 01/10/22 0050 01/11/22 0047 01/12/22 0228 01/13/22 0811 01/13/22 1450 01/14/22 0408 01/14/22 1648 01/15/22 0538  NA 147* 146* 142 138  --   --   --  137  K 3.2* 4.0 3.3* 3.5  --   --   --  3.8  CL 119* 119*  115* 113*  --   --   --  108  CO2 23 22 23 21*  --   --   --  24  GLUCOSE 138* 132* 136* 112*  --   --   --  122*  BUN 30* 25* 23 19  --   --   --  21  CREATININE 1.69* 1.63* 1.71* 1.57*  --   --   --  1.34*  CALCIUM 8.7* 8.7* 8.5* 8.5*  --   --   --  8.5*  MG 1.9  --   --   --  1.7 1.6* 2.1 2.0  PHOS  --   --   --   --  2.2* 2.3* 2.5  --      No results found for this or any previous visit (from the past 240 hour(s)).   Radiology Studies: DG Abd Portable 1V  Result Date: 01/13/2022 CLINICAL DATA:  Feeding tube placement EXAM: PORTABLE ABDOMEN - 1 VIEW COMPARISON:  CT scan 12/11/2021 FINDINGS: The feeding tube terminates in the antropyloric region. Pacer leads noted. Prior CABG. Atherosclerotic calcification of the aortic arch. Prominence of  stool in the upper colon. IMPRESSION: 1. Pacer lead terminates in the antropyloric region of the stomach. 2.  Aortic Atherosclerosis (ICD10-I70.0). 3. Possible constipation. Electronically Signed   By: Walter  Liebkemann M.D.   On: 01/13/2022 15:27    Scheduled Meds:  amiodarone  200 mg Oral Daily   azelastine  1 spray Each Nare BID   Chlorhexidine Gluconate Cloth  6 each Topical Daily   docusate sodium  100 mg Oral BID   feeding supplement  237 mL Oral TID BM   feeding supplement (PROSource TF)  45 mL Per Tube BID   insulin aspart  0-9 Units Subcutaneous TID WC   mirtazapine  7.5 mg Oral QHS   mometasone-formoterol  2 puff Inhalation BID   nystatin  5 mL Oral QID   pantoprazole  40 mg Oral Daily   rosuvastatin  40 mg Oral QHS   venlafaxine XR  37.5 mg Oral QODAY   Continuous Infusions:  dextrose 50 mL/hr at 01/14/22 2017   feeding supplement (OSMOLITE 1.5 CAL) Stopped (01/14/22 2350)   vancomycin        LOS: 5 days    , MD [facility] P5/24/2023, 11:20 AM   

## 2022-01-15 NOTE — Plan of Care (Signed)

## 2022-01-15 NOTE — Progress Notes (Signed)
Orthopedic Tech Progress Note Patient Details:  [name] October 24, 1932 [id number]  RN called requesting a new SOFT COLLAR, old one is very dirty    Ortho Devices Type of Ortho Device: Soft collar Ortho Device/Splint Location: neck Ortho Device/Splint Interventions: Ordered   Post Interventions Patient Tolerated: Well Instructions Provided: [facility] [place] 01/15/2022, 7:23 PM

## 2022-01-15 NOTE — Progress Notes (Signed)
Physical Therapy Treatment Patient Details Name: [name] MRN: [id number] DOB: 23-Nov-1932 Today's Date: 01/15/2022   History of Present Illness Pt is an 88 year old male admitted 01/06/22 from SNF with AMS and slurred speech. Of note: recent admission 4/18-4/25 with C6 fx and pubic rami fx nonoperative mgmt. PMH includes: CAD, COPD, CVA, GERD, HTN, hx of CABG, PSA, a fib, hx of prostate cancer, DM, vertigo.    PT Comments    Pt is limited by pain in multiple areas but participates well in bed mobility and transfer training. Pt continued to demonstrate LE weakness and intermittent L lateral lean, placing him at a high risk for falls. Pt will benefit from continued acute PT services in an effort to reduce caregiver burden and falls risk. PT continues to recommend SNF placement. Pt will benefit from a hoyer lift at the time of discharge to improve patient and family safety if returning home.  Recommendations for follow up therapy are one component of a multi-disciplinary discharge planning process, led by the attending physician.  Recommendations may be updated based on patient status, additional functional criteria and insurance authorization.  Follow Up Recommendations  Skilled nursing-short term rehab (<3 hours/day) (HHPT if family elect to D/C home)     Assistance Recommended at Discharge Frequent or constant Supervision/Assistance  Patient can return home with the following Two people to help with walking and/or transfers;Two people to help with bathing/dressing/bathroom;Assistance with cooking/housework;Assistance with feeding;Direct supervision/assist for medications management;Direct supervision/assist for financial management;Assist for transportation;Help with stairs or ramp for entrance   Equipment Recommendations  Other (comment) (hoyer lift)    Recommendations for Other Services       Precautions / Restrictions Precautions Precautions: Fall;Cervical Precaution Booklet  Issued: No Precaution Comments: watch BP Required Braces or Orthoses: Cervical Brace Cervical Brace: Soft collar;For comfort Restrictions Weight Bearing Restrictions: Yes RLE Weight Bearing: Weight bearing as tolerated LLE Weight Bearing: Weight bearing as tolerated     Mobility  Bed Mobility Overal bed mobility: Needs Assistance Bed Mobility: Rolling, Supine to Sit, Sit to Supine Rolling: Mod assist   Supine to sit: Max assist, HOB elevated Sit to supine: Max assist   General bed mobility comments: pt requiring modA to complete roll at trunk/hips, does initiate well with reaching with UEs. Pt requires assistance to pull trunk into sitting and to elevate LEs back onto bed when returning to supine    Transfers Overall transfer level: Needs assistance Equipment used: Rolling walker (2 wheels) Transfers: Sit to/from Stand Sit to Stand: Max assist, From elevated surface           General transfer comment: PT provides cues for rocking and to widen BOS. Pt with left lateral lean during 1st stand, less significant with 2nd stand. Pt defers further standing attempts    Ambulation/Gait                   Stairs             Wheelchair Mobility    Modified Rankin (Stroke Patients Only)       Balance Overall balance assessment: Needs assistance Sitting-balance support: No upper extremity supported, Feet supported Sitting balance-Leahy Scale: Fair     Standing balance support: Bilateral upper extremity supported, Reliant on assistive device for balance Standing balance-Leahy Scale: Poor Standing balance comment: mod-maxA with left lean  Cognition Arousal/Alertness: Awake/alert Behavior During Therapy: Anxious Overall Cognitive Status: Impaired/Different from baseline Area of Impairment: Orientation, Memory, Following commands, Safety/judgement, Awareness, Problem solving                 Orientation Level:  Disoriented to, Time Current Attention Level: Sustained Memory: Decreased short-term memory Following Commands: Follows one step commands with increased time Safety/Judgement: Decreased awareness of safety, Decreased awareness of deficits Awareness: Intellectual Problem Solving: Slow processing, Decreased initiation, Requires verbal cues          Exercises      General Comments General comments (skin integrity, edema, etc.): VSS on RA      Pertinent Vitals/Pain Pain Assessment Pain Assessment: Faces Faces Pain Scale: Hurts even more Pain Location: back, buttocks, scrotum, nose Pain Descriptors / Indicators: Sore Pain Intervention(s): Monitored during session    Home Living                          Prior Function            PT Goals (current goals can now be found in the care plan section) Acute Rehab PT Goals Patient Stated Goal: to eventually return home Progress towards PT goals: Progressing toward goals    Frequency    Min 3X/week      PT Plan Current plan remains appropriate    Co-evaluation              AM-PAC PT "6 Clicks" Mobility   Outcome Measure  Help needed turning from your back to your side while in a flat bed without using bedrails?: A Lot Help needed moving from lying on your back to sitting on the side of a flat bed without using bedrails?: A Lot Help needed moving to and from a bed to a chair (including a wheelchair)?: Total Help needed standing up from a chair using your arms (e.g., wheelchair or bedside chair)?: A Lot Help needed to walk in hospital room?: Total Help needed climbing 3-5 steps with a railing? : Total 6 Click Score: 9    End of Session Equipment Utilized During Treatment: Cervical collar Activity Tolerance: Patient limited by pain Patient left: in bed;with call bell/phone within reach;with bed alarm set Nurse Communication: Mobility status;Need for lift equipment PT Visit Diagnosis: History of falling  (Z91.81);Pain;Muscle weakness (generalized) (M62.81);Unsteadiness on feet (R26.81) Pain - Right/Left: Right Pain - part of body: Hip     Time: 0930-1000 PT Time Calculation (min) (ACUTE ONLY): 30 min  Charges:  $Therapeutic Activity: 23-37 mins                     Zenaida Niece, PT, DPT Acute Rehabilitation Pager: [phone] Office [phone]    Zenaida Niece 01/15/2022, 10:14 AM

## 2022-01-15 NOTE — Progress Notes (Signed)
SLP Cancellation Note  Patient Details Name: [name] MRN: [id number] DOB: 03/10/1933   Cancelled treatment:       Reason Eval/Treat Not Completed: Patient at procedure or test/unavailable. Pt NPO for PEG tube placement today   Veva Grimley, Katherene Ponto 01/15/2022, 11:33 AM

## 2022-01-15 NOTE — Progress Notes (Signed)
Patient made NPO and tube feeds have been stopped for PEG placement.

## 2022-01-15 NOTE — Progress Notes (Signed)
Patient ID: [name], male   DOB: August 12, 1933, 88 y.o.   MRN: [id number]    Progress Note from the Palliative Medicine Team at [facility]   Patient Name: [name]        Date: 01/15/2022 DOB: 01/09/1933  Age: 88 y.o. MRN#: [id number] Attending Physician: Calvin Coss, MD Primary Care Physician: Calvin Norlander, DO Admit Date: 01/06/2022   Medical records reviewed   88 y.o. male   admitted on 01/06/2022 with  past medical history significant of PAF on Eliquis, HFrEF (EF 35% by TTE 04/25/2020), CAD s/p CABG, Mobitz 2 second-degree AV block s/p PPM, COPD, T2DM, HTN, HLD, thrombocytopenia, recurrent UTIs, OSA not tolerating CPAP,  AMS    He was going to be discharged home from the SNF after rehab but was altered and had slurred speech. He fell 3 weeks ago and has 3 fractures in his ramus and a cervical fracture. Admitted to trauma service at [facility] from 4/18-4/25. Discharged to SNF for rehab.    His daughter states that his speech has been gargled for a few days-weeks. He has had poor PO intake since being at the SNF. He was alert, but not oriented which is not his baseline. He would also say some things that didn't make sense. They called his daughter who is an ICU nurse and advised they call EMS.    Baseline A&O x 4. Recently treated for UTI and finished abx yesterday.   This NP visited patient at the bedside to meet with daughter/ [name] as scheduled for  continued conversation regarding current medical situation and to discuss decisions related to treatment option, advanced directives and anticipatory care needs.  Patient's wife and granddaughter at bedside.  They had many questions regarding PEG tube specific to him being discharged home.  Education offered on risks and benefits of PEG tube in an 88-year-old patient with multiple co-morbidities to include cognitive decline Continued concern that the patient is not eating and drinking enough to support himself from a nutritional  standpoint.[name] understands; family gathered over the weekend and they are requesting a trial of artificial feeding.  They are open to PEG placement.    Plan of Care: -Full Code     - Educated family to consider DNR/DNI status understanding evidenced based poor outcomes in similar hospitalized patient, as the cause of arrest is likely associated with advanced chronic illness rather than an easily reversible acute cardio-pulmonary event.  -Family is open to all offered and available medical interventions to prolong life;   treat the treatable and hope for improvement and ultimately return home.         -PEG. To be place today  -Underlying chronic pain-Tylenol 1000 mg p.o. every 8 hours scheduled/discussed with daughter and she agrees -Continue with PT and OT, evaluate for transition of care closer to discharge, family would consider SNF depending on facility    Education offered on concept specific to adult failure to thrive, this may be [name] new baseline  Education offered today regarding  the importance of continued conversation with family and the medical providers regarding overall plan of care and treatment options,  ensuring decisions are within the context of the patients values and GOCs.  Questions and concerns addressed   Discussed with [name] and [name] team via secure chat  PMT will continue support holistically    Calvin Lessen NP  Palliative Medicine Team Team Phone # [phone] Pager [phone]

## 2022-01-16 DIAGNOSIS — G9340 Encephalopathy, unspecified: Secondary | ICD-10-CM | POA: Diagnosis not present

## 2022-01-16 LAB — MAGNESIUM: Magnesium: 1.9 mg/dL (ref 1.7–2.4)

## 2022-01-16 LAB — BASIC METABOLIC PANEL
Anion gap: 5 (ref 5–15)
BUN: 18 mg/dL (ref 8–23)
CO2: 25 mmol/L (ref 22–32)
Calcium: 8.7 mg/dL — ABNORMAL LOW (ref 8.9–10.3)
Chloride: 105 mmol/L (ref 98–111)
Creatinine, Ser: 1.22 mg/dL (ref 0.61–1.24)
GFR, Estimated: 58 mL/min — ABNORMAL LOW (ref >60)
Glucose, Bld: 125 mg/dL — ABNORMAL HIGH (ref 70–99)
Potassium: 3.2 mmol/L — ABNORMAL LOW (ref 3.5–5.1)
Sodium: 135 mmol/L (ref 135–145)

## 2022-01-16 LAB — PHOSPHORUS: Phosphorus: 3.1 mg/dL (ref 2.5–4.6)

## 2022-01-16 LAB — GLUCOSE, CAPILLARY
Glucose-Capillary: 131 mg/dL — ABNORMAL HIGH (ref 70–99)
Glucose-Capillary: 169 mg/dL — ABNORMAL HIGH (ref 70–99)
Glucose-Capillary: 101 mg/dL — ABNORMAL HIGH (ref 70–99)
Glucose-Capillary: 167 mg/dL — ABNORMAL HIGH (ref 70–99)
Glucose-Capillary: 203 mg/dL — ABNORMAL HIGH (ref 70–99)

## 2022-01-16 MED ORDER — OSMOLITE 1.5 CAL PO LIQD
355.0000 mL | Freq: Four times a day (QID) | ORAL | Status: DC
  Administered 2022-01-16: 355 mL
  Administered 2022-01-16: 237 mL
  Administered 2022-01-17 – 2022-01-21 (×16): 355 mL
  Filled 2022-01-16 (×24): qty 474

## 2022-01-16 MED ORDER — FREE WATER
100.0000 mL | Freq: Four times a day (QID) | Status: DC
  Administered 2022-01-16 – 2022-01-18 (×8): 100 mL

## 2022-01-16 MED ORDER — POTASSIUM CHLORIDE 20 MEQ PO PACK
40.0000 meq | PACK | Freq: Two times a day (BID) | ORAL | Status: AC
  Administered 2022-01-16 (×2): 40 meq via ORAL
  Filled 2022-01-16 (×2): qty 2

## 2022-01-16 MED ORDER — INSULIN ASPART 100 UNIT/ML IJ SOLN
3.0000 [IU] | Freq: Once | INTRAMUSCULAR | Status: AC
  Administered 2022-01-16: 3 [IU] via SUBCUTANEOUS

## 2022-01-16 NOTE — Progress Notes (Signed)
PROGRESS NOTE    [name]  EYC:[id number] DOB: 08-Jul-1933 DOA: 01/06/2022 PCP: [name], DO   Brief Narrative:   Patient is a 88 year old male with history of paroxysmal A-fib, chronic systolic congestive heart failure, coronary artery disease status post CABG, sick sinus syndrome status post permanent pacemaker placement, COPD, diabetes type 2, hypertension, hyperlipidemia, chronic thrombocytopenia, recurrent UTI, sleep apnea not on CPAP who presented with altered mental status from home.  He was recently admitted here with polytrauma in April and was found to have C6 spinal fracture, multiple pelvic ring fracture and was discharged to  SNF, was discharged with hard collar.  He was recently treated for UTI.  At the SNF, patient continued to have poor oral intake, noted to have garbled speech and confused.  On presentation, CT head did not show any acute findings.  Patient was admitted for further evaluation.  Neurosurgery was consulted, no further intervention planned.  Hard collar transition to soft collar.   PT/OT recommending skilled nursing  facility on discharge.  Palliative care is also closely following.  Plan for PEG placement today.  Medically stable for discharge after PEG placement/likely tomorrow to [facility] with [facility]  Assessment & Plan:   Principal Problem:   Acute encephalopathy Active Problems:   Dysarthria   Acute kidney injury superimposed on chronic kidney disease (HCC)   C6 cervical fracture (HCC)   History of pelvic hematoma   Paroxysmal atrial fibrillation (HCC)   Closed fracture of multiple pubic rami, right, sequela   HFrEF (heart failure with reduced ejection fraction) ([place])   Controlled type 2 diabetes mellitus without complication, without long-term current use of insulin (HCC)   COPD (chronic obstructive pulmonary disease) (HCC)   S/P CABG x 3   Second degree Mobitz II AV block   Hyperlipidemia with target LDL less than 100   OSA (obstructive  sleep apnea)   Thrombocytopenia (HCC)   Protein-calorie malnutrition, severe  Assessment and Plan:   Acute metabolic encephalopathy: History of recent trauma, UTI which was treated with antibiotics.  Infection work-up has been negative.  Urinalysis not reassuring.  Chest x-ray did not show pneumonia.  CT head did not show any acute intracranial abnormalities.  MRI not ordered with low suspicion of stroke.  No neurological deficits.  Monitor mental status.  Delirium precautions.   Started on low-dose Seroquel but now stopped due sleepiness. Haldol for severe agitation only .  Mental status has remained stable since last few days, remains intermittently confused   Dysphagia: Patient history of polytrauma with C6 fracture.  Wearing hard collar.  Dysphagia suspected to be from hard collar.  Hard collar  changed to soft collar.  Speech therapy reconsulted.  Dietitian following.  Was on calorie count. Very poor oral intake.  Currently receiving artificial feeding through core track and PEG tube has been placed with 5/24.  Appreciate dietitian input.   Hypokalemia: Supplemented monitor in AM.   Recent history of polytrauma: Found to have C6 fracture on last admission.  Currently on soft collar.  Neurosurgery recommended outpatient follow-up.  Also found to have pelvic ring fracture with pelvic hematoma last admission.  Was residing in a nursing facility but was planned for discharge to home.  Continue pain management, supportive care.  PT/OT recommended skilled nursing facility on discharge .   Paroxysmal A-fib: Currently in normal sinus rhythm.  Eliquis was discontinued on last admission after polytrauma but has been resumed at lowe dose now, now on hold for  PEG,resume tomorrow.  Also on amiodarone.   Chronic systolic congestive heart failure: Euvolemic.  Takes Lasix, Entresto, Aldactone, beta-blockers at home: These are on hold.  Blood pressure soft so we will continue holding for now.  Last  echocardiogram  as on 04/2020 showed EF of 35%   Diabetes type 2: On metformin at home.  Currently on sliding scale insulin.  Monitor blood sugars   Hyperlipidemia: Takes crestor   History of depression: On Remeron, Effexor   Sick sinus syndrome: Status post pacemaker placement   OSA: Intolerant to CPAP   Thrombocytopenia: Chronic.  Stable   CKD stage IIIb: Baseline creatinine 1.4-1.6.  Kidney function  currently at baseline   Urinary retention: Foley placed, will give voiding trial.  Started on tamsulosin   Goals of care: Palliative care closely following.  Remains full code.Palliative care met with family.  We recommend DNR.  PT/OT recommending skilled facility on discharge.  Family will soon decide on whether to go to skilled facility or to home with home health     DVT prophylaxis:SCDs Code Status: Full Family Communication: Discussed with daughter Paulene Floor on phone 5/25 Disposition Plan:  Status is: Inpatient Remains inpatient appropriate because: Need for IV medications and initiation of tube feeds.   Nutritional Assessment:  The patient's BMI is: Body mass index is 23.55 kg/m.[name]  Seen by dietician.  I agree with the assessment and plan as outlined below:  Nutrition Status: Nutrition Problem: Severe Malnutrition Etiology: chronic illness (CHF, COPD, dysphagia) Signs/Symptoms: severe fat depletion, severe muscle depletion, percent weight loss (14.1% weight loss in less than 7 months) Percent weight loss: 14.1 % Interventions: Refer to RD note for recommendations  .  Consultants:  IR  Procedures:  PEG tube placement 5/24  Antimicrobials:  Anti-infectives (From admission, onward)    Start     Dose/Rate Route Frequency Ordered Stop   01/15/22 0600  vancomycin (VANCOCIN) IVPB 1000 mg/200 mL premix        1,000 mg 200 mL/hr over 60 Minutes Intravenous To Radiology 01/14/22 1049 01/15/22 1603       Subjective: Patient seen and evaluated today with some  ongoing fatigue and confusion. No acute concerns or events noted overnight.  Objective: Vitals:   01/15/22 2307 01/16/22 0317 01/16/22 0324 01/16/22 0738  BP: (!) 101/55 (!) 110/56  (!) 100/49  Pulse: 76 71  74  Resp:  16  16  Temp: 97.8 F (36.6 C) 98.3 F (36.8 C)  98 F (36.7 C)  TempSrc: Oral Oral    SpO2: 94% 95%  94%  Weight:   76.6 kg     Intake/Output Summary (Last 24 hours) at 01/16/2022 1020 Last data filed at 01/16/2022 0545 Gross per 24 hour  Intake 595 ml  Output 500 ml  Net 95 ml   Filed Weights   01/14/22 0331 01/15/22 0351 01/16/22 0324  Weight: 79.6 kg 79.7 kg 76.6 kg    Examination:  General exam: Appears somewhat somnolent and confused Respiratory system: Clear to auscultation. Respiratory effort normal. Cardiovascular system: S1 & S2 heard, RRR.  Gastrointestinal system: Abdomen is soft, PEG tube clean dry and intact. Central nervous system: Confused, but arousable Extremities: No edema Skin: No significant lesions noted Psychiatry: Flat affect.    Data Reviewed: I have personally reviewed following labs and imaging studies  CBC: No results for input(s): WBC, NEUTROABS, HGB, HCT, MCV, PLT in the last 168 hours. Basic Metabolic Panel: Recent Labs  Lab 01/10/22 0050 01/11/22 0047 01/12/22 5397  01/13/22 0981 01/13/22 1450 01/14/22 0408 01/14/22 1648 01/15/22 0538 01/16/22 0443  NA 147* 146* 142 138  --   --   --  137 135  K 3.2* 4.0 3.3* 3.5  --   --   --  3.8 3.2*  CL 119* 119* 115* 113*  --   --   --  108 105  CO2 _0 21*  --   --   --  24 25  GLUCOSE 138* 132* 136* 112*  --   --   --  122* 125*  BUN 30* 25* 23 19  --   --   --  21 18  CREATININE 1.69* 1.63* 1.71* 1.57*  --   --   --  1.34* 1.22  CALCIUM 8.7* 8.7* 8.5* 8.5*  --   --   --  8.5* 8.7*  MG 1.9  --   --   --  1.7 1.6* 2.1 2.0  --   PHOS  --   --   --   --  2.2* 2.3* 2.5  --  3.1   GFR: Estimated Creatinine Clearance: 46.3 mL/min (by C-G formula based on SCr of  1.22 mg/dL). Liver Function Tests: No results for input(s): AST, ALT, ALKPHOS, BILITOT, PROT, ALBUMIN in the last 168 hours. No results for input(s): LIPASE, AMYLASE in the last 168 hours. No results for input(s): AMMONIA in the last 168 hours. Coagulation Profile: No results for input(s): INR, PROTIME in the last 168 hours. Cardiac Enzymes: No results for input(s): CKTOTAL, CKMB, CKMBINDEX, TROPONINI in the last 168 hours. BNP (last 3 results) No results for input(s): PROBNP in the last 8760 hours. HbA1C: No results for input(s): HGBA1C in the last 72 hours. CBG: Recent Labs  Lab 01/15/22 0741 01/15/22 1150 01/15/22 2033 01/15/22 2341 01/16/22 0740  GLUCAP 108* 108* 95 140* 131*   Lipid Profile: No results for input(s): CHOL, HDL, LDLCALC, TRIG, CHOLHDL, LDLDIRECT in the last 72 hours. Thyroid Function Tests: No results for input(s): TSH, T4TOTAL, FREET4, T3FREE, THYROIDAB in the last 72 hours. Anemia Panel: No results for input(s): VITAMINB12, FOLATE, FERRITIN, TIBC, IRON, RETICCTPCT in the last 72 hours. Sepsis Labs: No results for input(s): PROCALCITON, LATICACIDVEN in the last 168 hours.  No results found for this or any previous visit (from the past 240 hour(s)).       Radiology Studies: IR GASTROSTOMY TUBE MOD SED  Result Date: 01/15/2022 CLINICAL DATA:  Dysphagia and need for percutaneous gastrostomy tube for long-term nutrition. EXAM: PERCUTANEOUS GASTROSTOMY TUBE PLACEMENT ANESTHESIA/SEDATION: Moderate (conscious) sedation was employed during this procedure. A total of Versed 2.0 mg and Fentanyl 100 mcg was administered intravenously by radiology nursing. Moderate Sedation Time: 20 minutes. The patient's level of consciousness and vital signs were monitored continuously by radiology nursing throughout the procedure under my direct supervision. CONTRAST:  66m OMNIPAQUE IOHEXOL 300 MG/ML  SOLN MEDICATIONS: 1 g IV vancomycin. IV antibiotic was administered in an  appropriate time interval prior to needle puncture of the skin. FLUOROSCOPY TIME:  3 minutes and 30 seconds.  50.0 mGy. PROCEDURE: The procedure, risks, benefits, and alternatives were explained to the patient's daughter. Questions regarding the procedure were encouraged and answered. The patient's daughter understands and consents to the procedure. A 5-French catheter was then advanced through the patient's mouth under fluoroscopy into the esophagus and to the level of the stomach. This catheter was used to insufflate the stomach with air under fluoroscopy. The abdominal wall was prepped with chlorhexidine  in a sterile fashion, and a sterile drape was applied covering the operative field. A sterile gown and sterile gloves were used for the procedure. Local anesthesia was provided with 1% Lidocaine. A skin incision was made in the upper abdominal wall. Under fluoroscopy, an 18 gauge trocar needle was advanced into the stomach. Contrast injection was performed to confirm intraluminal position of the needle tip. A single T tack was then deployed in the lumen of the stomach. This was brought up to tension at the skin surface. Over a guidewire, a 9-French sheath was advanced into the lumen of the stomach. The wire was left in place as a safety wire. A loop snare device from a percutaneous gastrostomy kit was then advanced into the stomach. A floppy guide wire was advanced through the orogastric catheter under fluoroscopy in the stomach. The loop snare advanced through the percutaneous gastric access was used to snare the guide wire. This allowed withdrawal of the loop snare out of the patient's mouth by retraction of the orogastric catheter and wire. A 20-French bumper retention gastrostomy tube was looped around the snare device. It was then pulled back through the patient's mouth. The retention bumper was brought up to the anterior gastric wall. The T tack suture was cut at the skin. The exiting gastrostomy tube was  cut to appropriate length and a feeding adapter applied. The catheter was injected with contrast material to confirm position and a fluoroscopic spot image saved. The tube was then flushed with saline. A dressing was applied over the gastrostomy exit site. COMPLICATIONS: None. FINDINGS: The stomach distended well with air allowing safe placement of the gastrostomy tube. After placement, the tip of the gastrostomy tube lies in the body of the stomach. IMPRESSION: Percutaneous gastrostomy with placement of a 20-French bumper retention tube in the body of the stomach. This tube can be used for percutaneous feeds beginning in 24 hours after placement. Electronically Signed   By: Aletta Edouard M.D.   On: 01/15/2022 16:01   DG Abd Portable 1V  Result Date: 01/15/2022 CLINICAL DATA:  Evaluate bowel opacification. EXAM: PORTABLE ABDOMEN - 1 VIEW COMPARISON:  01/13/2022 FINDINGS: Weighted tip enteric tube overlies expected location of the gastric antrum. Enteric contrast extends to the level of the rectum. Moderate to marked colonic gaseous distention without evidence of enteric obstruction. Nondiagnostic evaluation for pneumoperitoneum. No definite pneumatosis or portal venous gas. Post cholecystectomy No acute osseous abnormalities. Degenerative change the lower lumbar spine is suspected though incompletely evaluated. IMPRESSION: 1. Enteric contrast seen to the level of the rectum. 2. Moderate to marked gaseous distention of the colon without evidence of enteric obstruction. Electronically Signed   By: Sandi Mariscal M.D.   On: 01/15/2022 13:09        Scheduled Meds:  amiodarone  200 mg Oral Daily   azelastine  1 spray Each Nare BID   Chlorhexidine Gluconate Cloth  6 each Topical Daily   docusate sodium  100 mg Oral BID   feeding supplement  237 mL Oral TID BM   feeding supplement (PROSource TF)  45 mL Per Tube BID   insulin aspart  0-9 Units Subcutaneous TID WC   mirtazapine  7.5 mg Oral QHS    mometasone-formoterol  2 puff Inhalation BID   nystatin  5 mL Oral QID   pantoprazole  40 mg Oral Daily   potassium chloride  40 mEq Oral BID   rosuvastatin  40 mg Oral QHS   tamsulosin  0.4 mg Oral  Daily   venlafaxine XR  37.5 mg Oral QODAY   Continuous Infusions:  dextrose 50 mL/hr at 01/14/22 2017   feeding supplement (OSMOLITE 1.5 CAL) Stopped (01/14/22 2350)     LOS: 6 days    Time spent: 35 minutes    Almena Hokenson Darleen Crocker, DO [facility]  If 7PM-7AM, please contact night-coverage [email or web address] 01/16/2022, 10:20 AM

## 2022-01-16 NOTE — Progress Notes (Signed)
Nutrition Follow-up  DOCUMENTATION CODES:   Severe malnutrition in context of chronic illness  INTERVENTION:   - Recommend Cortrak removal  Transition to bolus tube feeding regimen via PEG: - Goal of 355 ml (1.5 cartons) Osmolite 1.5 cal formula QID  First bolus: 237 ml (1 carton)  Second bolus and goal: 355 ml (1.5 cartons) - Free water flush of 50 ml before and after each bolus  Bolus tube feeding regimen provides 2130 kcal, 89 grams of protein, and 1086 ml of H2O.  Total free water with flushes: 1486 ml  - Continue to offer Ensure Enlive po TID, each supplement provides 350 kcal and 20 grams of protein  - Continue to offer Magic cup TID with meals, each supplement provides 290 kcal and 9 grams of protein  - Continue to encourage PO intake and provide feeding assistance  NUTRITION DIAGNOSIS:   Severe Malnutrition related to chronic illness (CHF, COPD, dysphagia) as evidenced by severe fat depletion, severe muscle depletion, percent weight loss (14.1% weight loss in less than 7 months).  Ongoing, being addressed via oral nutrition supplements and TF  GOAL:   Patient will meet greater than or equal to 90% of their needs  Progressing with advancement of TF to goal  MONITOR:   Diet advancement, Labs, Weight trends, I & O's  REASON FOR ASSESSMENT:   Consult Assessment of nutrition requirement/status, Poor PO  ASSESSMENT:   88 year old male who presented to the ED on 5/15 with AMS. PMH of PAF, CHF, CAD s/p CABG, Mobitz 2 second-degree AV block s/p PPM, COPD, T2DM, HTN, HLD, thrombocytopenia, recurrent UTIs, OSA. Pt admitted with acute encephalopathy, AKI, C6 cervical fx.  05/16 - NPO 05/17 - diet advanced to dysphagia 2 with thin liquids 05/22 - Cortrak placed (tip gastric), TF started 05/23 - s/p PEG  Consult received for enteral nutrition initiation and management. Will transition pt to bolus tube feeding regimen now that PEG has been placed. Discussed in depth  with RN and with MD.  Damaris Schooner with pt's family at bedside. Explained plan to transition to bolus regimen and that pt will not require a pump at discharge. RN to provide bedside teaching. Family with multiple questions regarding tube feeds. All questions answered. RN to hold continuous tube feeds until first bolus which is scheduled at 1500 (approximately 24 hours after PEG placement).  Current TF: Osmolite 1.5 @ 50 ml/hr, ProSource TF 45 ml BID  Admit weight: 77.1 kg Current weight: 76.6 kg  Pt with non-pitting edema to BLE.  Meal Completion: 0-10%  Medications reviewed and include: colace, Ensure Enlive TID, SSI, remeron, nystatin mouthwash, protonix, klor-con 40 mEq x 2 doses  Labs reviewed: potassium 3.2 CBG's: 95-140 x 24 hours  Diet Order:   Diet Order             DIET DYS 2 Room service appropriate? Yes; Fluid consistency: Thin  Diet effective now                   EDUCATION NEEDS:   Not appropriate for education at this time  Skin:  Skin Assessment: Reviewed RN Assessment (wound to scrotum)  Last BM:  01/16/22  Height:   Ht Readings from Last 1 Encounters:  12/11/21 '5\' 11"'$  (1.803 m)    Weight:   Wt Readings from Last 1 Encounters:  01/16/22 76.6 kg    BMI:  Body mass index is 23.55 kg/m.  Estimated Nutritional Needs:   Kcal:  1800-2000  Protein:  95-115 grams  Fluid:  1.8 L/day    Gustavus Bryant, MS, RD, LDN Inpatient Clinical Dietitian Please see AMiON for contact information.

## 2022-01-16 NOTE — Consult Note (Signed)
   [facility] Inpatient Consult   01/16/2022  [name] 1932/09/22 [id number]  [place] [facility] [ACO] Patient: [employer] Medicare   Primary Care Provider:  [name], DO, Jeff, is an embedded provider with a Chronic Care Management team and program, and is listed for the transition of care follow up and appointments.  Patient was reviewed for LLOS and extreme high risk score for unplanned readmission risk, with noted less than 30 day readmission.  Patient has been active with chronic care management team with the Embedded Social Worker noted.  Progress notes reviewed with inpatient [facility] team, PT/OT, Palliative care consult.  Notes that patient is currently recommended for SNF however family desires home with home health.  Plan: Currently, plan to send notification to be sent to the [place] Management team to make aware of TOC needs for post hospital needs at transition. Continue to follow.  Please contact for further questions,  Natividad Brood, RN BSN [place] [facility] Liaison  [phone] business mobile phone Toll free office [phone]  Fax number: [phone] [email or web address]

## 2022-01-16 NOTE — TOC Progression Note (Addendum)
Transition of Care [facility]) - Progression Note    Patient Details  Name: [name] MRN: [id number] Date of Birth: 1933-07-14  Transition of Care [facility]) CM/SW [place], RN Phone [phone]  01/16/2022, 2:14 PM  Clinical Narrative:   CM at bedside to discuss disposition plan and DME needs. Daughter [name] at bedside. Per daughter it is the families desire to take the patient home and care for him. Daughter states that she has all needed DME at home (walker, lift chair, BSC, toilet seat riser and shower chair and access to hospital bed). CM made daughter aware that current recommendation per PT is for hoyer lift. Daughter states that she does not think she will need lift. Daughter states that [facility] has already reached out and will follow for home health needs. Per daughter she wants some issues addressed prior to patient being discharged.  Tube feeding Education Address tube feeding formula because current formula is causing a lot of diarrhea. (Message has been sent to dietician Thompson Caul)  [place] spoke with dietician who states  patient  has been on a bowel regimen since he was admitted. Typically what happens is the bowel regimen is needed for constipation (it was several days into his admission before he had a bowel movement). A bolus regimen  will also likely help with this Daughter feels patient needs voiding trial due to retention Questionable education on in and out cath if needed Someone to review medication list       Expected Discharge Plan: [place] Barriers to Discharge: Continued Medical Work up  Expected Discharge Plan and Services Expected Discharge Plan: [place] Choice: [place] arrangements for the past 2 months: [place] Determinants of Health (SDOH) Interventions    Readmission Risk  Interventions    01/16/2022    2:12 PM  Readmission Risk Prevention Plan  Transportation Screening Complete  Medication Review (RN Care Manager) Referral to Pharmacy  PCP or Specialist appointment within 3-5 days of discharge Complete  HRI or [place] Complete  SW Recovery Care/Counseling Consult Complete  [place] Not Applicable

## 2022-01-16 NOTE — Progress Notes (Signed)
TF stopped at 0005 due to pt agitation, not wanting to repositioned stated "I want you to leave me alone" pt tried to hit this RN and the NT multiple times, mittens applied, bed in lowest position, bed alarm on.   0645 restarted TF while patient was resting and in an upright position, HOB 30 degrees

## 2022-01-16 NOTE — Progress Notes (Signed)
Physical Therapy Treatment Patient Details Name: [name] MRN: [id number] DOB: [birth year] Today's Date: 01/16/2022   History of Present Illness Pt is an 88 year old male admitted 01/06/22 from SNF with AMS and slurred speech. Of note: recent admission 4/18-4/25 with C6 fx and pubic rami fx nonoperative mgmt. PMH includes: CAD, COPD, CVA, GERD, HTN, hx of CABG, PSA, a fib, hx of prostate cancer, DM, vertigo.    PT Comments    Pt not tolerating treatment well, becoming agitated with attempts at mobility this session. Despite PT/OTA and family encouragement the pt refuses to maintain sitting position at this time. Pt appears limited by pain in hips/pelvis but refuses to report areas of pain. Pt will benefit from continued aggressive mobiliztion in an effort to reduce falls risk and caregiver burden. PT discusses equipment recommendations for transfers with pt's daughter, [name]. Family feels they have capable 2 person assistance for stand/squat pivot transfers and do not feel the need for a hoyer lift at this time.   Recommendations for follow up therapy are one component of a multi-disciplinary discharge planning process, led by the attending physician.  Recommendations may be updated based on patient status, additional functional criteria and insurance authorization.  Follow Up Recommendations  Home health PT (family declining placement and electing to D/C home)     Assistance Recommended at Discharge Frequent or constant Supervision/Assistance  Patient can return home with the following Two people to help with walking and/or transfers;Two people to help with bathing/dressing/bathroom;Assistance with cooking/housework;Assistance with feeding;Direct supervision/assist for medications management;Assist for transportation;Direct supervision/assist for financial management;Help with stairs or ramp for entrance   Equipment Recommendations  Wheelchair (measurements PT);Other (comment) (hoyer lift)     Recommendations for Other Services       Precautions / Restrictions Precautions Precautions: Fall;Cervical Precaution Booklet Issued: No Precaution Comments: watch BP Required Braces or Orthoses: Cervical Brace Cervical Brace: Soft collar;For comfort Restrictions Weight Bearing Restrictions: Yes RLE Weight Bearing: Weight bearing as tolerated LLE Weight Bearing: Weight bearing as tolerated     Mobility  Bed Mobility Overal bed mobility: Needs Assistance Bed Mobility: Rolling, Sidelying to Sit, Sit to Sidelying Rolling: Max assist, +2 for physical assistance Sidelying to sit: Max assist, +2 for physical assistance     Sit to sidelying: Max assist, +2 for physical assistance General bed mobility comments: pt resistant to mobility this session, often attempting to lay back down and refusing to sit up for >10 seconds at a time    Transfers Overall transfer level:  (pt refusing attempts)                      Ambulation/Gait                   Stairs             Wheelchair Mobility    Modified Rankin (Stroke Patients Only)       Balance Overall balance assessment: Needs assistance Sitting-balance support: Single extremity supported, Feet supported Sitting balance-Leahy Scale: Poor Sitting balance - Comments: maxA, left lateral lean, pt electing to lean onto left side in efforts to return to lying Postural control: Left lateral lean                                  Cognition Arousal/Alertness: Lethargic (responding verbally but refusing to open eyes) Behavior During Therapy: Agitated Overall Cognitive Status: Impaired/Different from baseline Area  of Impairment: Orientation, Attention, Memory, Following commands, Safety/judgement, Awareness, Problem solving                 Orientation Level: Disoriented to, Situation, Time Current Attention Level: Sustained Memory: Decreased recall of precautions, Decreased short-term  memory Following Commands: Follows one step commands inconsistently Safety/Judgement: Decreased awareness of safety, Decreased awareness of deficits Awareness: Intellectual Problem Solving: Slow processing, Decreased initiation, Requires verbal cues, Requires tactile cues          Exercises      General Comments General comments (skin integrity, edema, etc.): VSS on RA      Pertinent Vitals/Pain Pain Assessment Pain Assessment: Faces Faces Pain Scale: Hurts even more Pain Location: pt refuses to report location Pain Descriptors / Indicators: Moaning Pain Intervention(s): Monitored during session    Home Living                          Prior Function            PT Goals (current goals can now be found in the care plan section) Acute Rehab PT Goals Patient Stated Goal: to eventually return home Progress towards PT goals: Not progressing toward goals - comment (limited by agitation)    Frequency    Min 3X/week      PT Plan Current plan remains appropriate    Co-evaluation PT/OT/SLP Co-Evaluation/Treatment: Yes Reason for Co-Treatment: Complexity of the patient's impairments (multi-system involvement);Necessary to address cognition/behavior during functional activity;For patient/therapist safety PT goals addressed during session: Mobility/safety with mobility;Balance        AM-PAC PT "6 Clicks" Mobility   Outcome Measure  Help needed turning from your back to your side while in a flat bed without using bedrails?: Total Help needed moving from lying on your back to sitting on the side of a flat bed without using bedrails?: Total Help needed moving to and from a bed to a chair (including a wheelchair)?: Total Help needed standing up from a chair using your arms (e.g., wheelchair or bedside chair)?: Total Help needed to walk in hospital room?: Total Help needed climbing 3-5 steps with a railing? : Total 6 Click Score: 6    End of Session Equipment  Utilized During Treatment: Cervical collar Activity Tolerance: Patient limited by pain Patient left: in bed;with call bell/phone within reach;with bed alarm set Nurse Communication: Mobility status;Need for lift equipment PT Visit Diagnosis: History of falling (Z91.81);Pain;Muscle weakness (generalized) (M62.81);Unsteadiness on feet (R26.81) Pain - Right/Left: Right Pain - part of body: Hip     Time: 0737-1062 PT Time Calculation (min) (ACUTE ONLY): 20 min  Charges:  $Therapeutic Activity: 8-22 mins                     Zenaida Niece, PT, DPT Acute Rehabilitation Pager: [phone] Office [place] Aydrien Froman 01/16/2022, 3:30 PM

## 2022-01-16 NOTE — Progress Notes (Signed)
Occupational Therapy Treatment Patient Details Name: [name] MRN: [id number] DOB: [birth year] Today's Date: 01/16/2022   History of present illness Pt is an 88 year old male admitted 01/06/22 from SNF with AMS and slurred speech. Of note: recent admission 4/18-4/25 with C6 fx and pubic rami fx nonoperative mgmt. PMH includes: CAD, COPD, CVA, GERD, HTN, hx of CABG, PSA, a fib, hx of prostate cancer, DM, vertigo.   OT comments  Pt seen in conjunction with PT to progress functional mobility and optimize activity tolerance and participation, however session limited by impaired level of arousal and moments of agitation. Pt needed MAX A +2 for all aspects of bed mobility and up to [facility]  for sitting balance with pt attempting to lay back down. Pt becoming agitated needing MAX A +2 to return to supine. Did discuss with daughter plan to return home with [facility] therapies and 24/7 care, reviewed DME needs with pt already having needed DME, daughter declined Harrel Lemon lift at this time with plan to either buy/rent lift if needed, alerted OTR concerning change in POC. Will continue to follow for OT needs.    Recommendations for follow up therapy are one component of a multi-disciplinary discharge planning process, led by the attending physician.  Recommendations may be updated based on patient status, additional functional criteria and insurance authorization.    Follow Up Recommendations  Home health OT    Assistance Recommended at Discharge Other (comment) (24/7 supervision)  Patient can return home with the following  Two people to help with walking and/or transfers;A lot of help with bathing/dressing/bathroom;Direct supervision/assist for medications management;Direct supervision/assist for financial management;Assistance with feeding;Assist for transportation;Help with stairs or ramp for entrance   Equipment Recommendations  Wheelchair (measurements OT);Wheelchair cushion (measurements OT)     Recommendations for Other Services      Precautions / Restrictions Precautions Precautions: Fall;Cervical Precaution Booklet Issued: No Precaution Comments: watch BP Required Braces or Orthoses: Cervical Brace Cervical Brace: Soft collar;For comfort Restrictions Weight Bearing Restrictions: Yes RLE Weight Bearing: Weight bearing as tolerated LLE Weight Bearing: Weight bearing as tolerated       Mobility Bed Mobility Overal bed mobility: Needs Assistance Bed Mobility: Rolling, Sidelying to Sit, Sit to Sidelying Rolling: Max assist, +2 for physical assistance Sidelying to sit: Max assist, +2 for physical assistance     Sit to sidelying: Max assist, +2 for physical assistance General bed mobility comments: pt resistant to mobility this session, often attempting to lay back down and refusing to sit up for >10 seconds at a time    Transfers                         Balance Overall balance assessment: Needs assistance Sitting-balance support: Single extremity supported, Feet supported Sitting balance-Leahy Scale: Poor Sitting balance - Comments: maxA, left lateral lean, pt electing to lean onto left side in efforts to return to lying Postural control: Left lateral lean                                 ADL either performed or assessed with clinical judgement   ADL Overall ADL's : Needs assistance/impaired                                     Functional mobility during ADLs: Maximal assistance;+2 for physical assistance (bed  mobility) General ADL Comments: session limited to EOB only as pt agitated and lethargic    Extremity/Trunk Assessment Upper Extremity Assessment Upper Extremity Assessment: Generalized weakness   Lower Extremity Assessment Lower Extremity Assessment: Defer to PT evaluation        Vision Baseline Vision/History:  (unable to assess as pt keeping eyes closed majority of session)     Perception  Perception Perception: Not tested   Praxis Praxis Praxis: Not tested    Cognition Arousal/Alertness: Lethargic (keeping eyes closed most of session, but responding verbally) Behavior During Therapy: Agitated Overall Cognitive Status: Impaired/Different from baseline Area of Impairment: Orientation, Attention, Memory, Following commands, Safety/judgement, Awareness, Problem solving                 Orientation Level: Disoriented to, Situation, Time Current Attention Level: Sustained Memory: Decreased recall of precautions, Decreased short-term memory Following Commands: Follows one step commands inconsistently Safety/Judgement: Decreased awareness of safety, Decreased awareness of deficits Awareness: Intellectual Problem Solving: Slow processing, Decreased initiation, Requires verbal cues, Requires tactile cues General Comments: lethargic and confused throughout, moment of agitaton with mobility        Exercises      Shoulder Instructions       General Comments VSS on RA, daughter and son in law present, reviewed all DME needs and plan to take pt home    Pertinent Vitals/ Pain       Pain Assessment Pain Assessment: Faces Faces Pain Scale: Hurts even more Pain Location: pt refuses to report location Pain Descriptors / Indicators: Moaning, Discomfort Pain Intervention(s): Monitored during session  Home Living                                          Prior Functioning/Environment              Frequency  Min 2X/week        Progress Toward Goals  OT Goals(current goals can now be found in the care plan section)  Progress towards OT goals: Not progressing toward goals - comment (agitated)  Acute Rehab OT Goals Patient Stated Goal: lay me down OT Goal Formulation: With patient Time For Goal Achievement: 01/21/22 Potential to Achieve Goals: [place] Discharge plan remains appropriate;Discharge plan needs to be updated;Frequency needs  to be updated    Co-evaluation    PT/OT/SLP Co-Evaluation/Treatment: Yes Reason for Co-Treatment: Complexity of the patient's impairments (multi-system involvement);For patient/therapist safety;Necessary to address cognition/behavior during functional activity PT goals addressed during session: Mobility/safety with mobility;Balance OT goals addressed during session: ADL's and self-care      AM-PAC OT "6 Clicks" Daily Activity     Outcome Measure   Help from another person eating meals?: Total Help from another person taking care of personal grooming?: A Lot Help from another person toileting, which includes using toliet, bedpan, or urinal?: Total Help from another person bathing (including washing, rinsing, drying)?: A Lot Help from another person to put on and taking off regular upper body clothing?: Total Help from another person to put on and taking off regular lower body clothing?: Total 6 Click Score: 8    End of Session Equipment Utilized During Treatment: Cervical collar  OT Visit Diagnosis: Unsteadiness on feet (R26.81);Other abnormalities of gait and mobility (R26.89);Muscle weakness (generalized) (M62.81);History of falling (Z91.81);Feeding difficulties (R63.3);Other symptoms and signs involving cognitive function;Adult, failure to thrive (R62.7);Pain   Activity Tolerance  Patient limited by fatigue;Patient limited by pain;Treatment limited secondary to agitation   Patient Left in bed;with call bell/phone within reach;with bed alarm set   Nurse Communication Mobility status        Time: 5284-1324 OT Time Calculation (min): 20 min  Charges: OT General Charges $OT Visit: 1 Visit  Harley Alto., COTA/L Acute Rehabilitation Services [phone]   Precious Haws 01/16/2022, 4:03 PM

## 2022-01-16 NOTE — TOC Progression Note (Signed)
Transition of Care [facility]) - Progression Note    Patient Details  Name: [name] MRN: [id number] Date of Birth: November 10, 1932  Transition of Care [facility]) CM/SW [place], RN Phone [phone]  01/16/2022, 11:46 AM  Clinical Narrative:    CM made aware that daughter [name] is still planning to take patient home to care for him. CM has reached out for DME needs no answer voicemail left. Will await return call.    Expected Discharge Plan: [place] Barriers to Discharge: Continued Medical Work up, Other (must enter comment) (Family confirmation)  Expected Discharge Plan and Services Expected Discharge Plan: [place] Choice: [place] arrangements for the past 2 months: Single Family Home                                       Social Determinants of Health (SDOH) Interventions    Readmission Risk Interventions     View : No data to display.

## 2022-01-16 NOTE — Progress Notes (Signed)
Patient ID: [name], male   DOB: 07-25-1933, 88 y.o.   MRN: [id number]    Progress Note from the Palliative Medicine Team at [facility]   Patient Name: [name]        Date: 01/16/2022 DOB: [birth year]  Age: 88 y.o. MRN#: [id number] Attending Physician: Rodena Goldmann, DO Primary Care Physician: [name], DO Admit Date: 01/06/2022   Medical records reviewed   88 y.o. male   admitted on 01/06/2022 with  past medical history significant of PAF on Eliquis, HFrEF (EF 35% by TTE 04/25/2020), CAD s/p CABG, Mobitz 2 second-degree AV block s/p PPM, COPD, T2DM, HTN, HLD, thrombocytopenia, recurrent UTIs, OSA not tolerating CPAP,  AMS    He was going to be discharged home from the SNF after rehab but was altered and had slurred speech. He fell 3 weeks ago and has 3 fractures in his ramus and a cervical fracture. Admitted to trauma service at [facility] from 4/18-4/25. Discharged to SNF for rehab.    His daughter states that his speech has been gargled for a few days-weeks. He has had poor PO intake since being at the SNF. He was alert, but not oriented which is not his baseline. He would also say some things that didn't make sense. They called his daughter who is an ICU nurse and advised they call EMS.    Baseline A&O x 4. Recently treated for UTI   This NP visited patient at the bedside.  He is intermittently confused, no family at bedside.  S/p PEG placement  Placed call to daughter, await call back  Plan of Care: -Full Code     - Educated family to consider DNR/DNI status understanding evidenced based poor outcomes in similar hospitalized patient, as the cause of arrest is likely associated with advanced chronic illness rather than an easily reversible acute cardio-pulmonary event.  -Family is open to all offered and available medical interventions to prolong life  -disposition undecided   Patient is high risk for decompensation.  Recommend outpatient palliative services on  discharge    Discussed with treatment team  PMT will continue to support holistically, I hope to have the opportunity to speak with patient's HPI/OA daughter [name] again before discharge.   Wadie Lessen NP  Palliative Medicine Team Team Phone # [phone] Pager [phone]

## 2022-01-17 ENCOUNTER — Inpatient Hospital Stay (HOSPITAL_COMMUNITY): Payer: Medicare Other

## 2022-01-17 DIAGNOSIS — G9340 Encephalopathy, unspecified: Secondary | ICD-10-CM | POA: Diagnosis not present

## 2022-01-17 LAB — IRON AND TIBC
Iron: 32 ug/dL — ABNORMAL LOW (ref 45–182)
Saturation Ratios: 16 % — ABNORMAL LOW (ref 17.9–39.5)
TIBC: 203 ug/dL — ABNORMAL LOW (ref 250–450)
UIBC: 171 ug/dL

## 2022-01-17 LAB — CBC
HCT: 28.7 % — ABNORMAL LOW (ref 39.0–52.0)
Hemoglobin: 9.2 g/dL — ABNORMAL LOW (ref 13.0–17.0)
MCH: 31.1 pg (ref 26.0–34.0)
MCHC: 32.1 g/dL (ref 30.0–36.0)
MCV: 97 fL (ref 80.0–100.0)
Platelets: 98 10*3/uL — ABNORMAL LOW (ref 150–400)
RBC: 2.96 MIL/uL — ABNORMAL LOW (ref 4.22–5.81)
RDW: 18.7 % — ABNORMAL HIGH (ref 11.5–15.5)
WBC: 7.4 10*3/uL (ref 4.0–10.5)
nRBC: 0 % (ref 0.0–0.2)

## 2022-01-17 LAB — RETICULOCYTES
Immature Retic Fract: 27.8 % — ABNORMAL HIGH (ref 2.3–15.9)
RBC.: 2.96 MIL/uL — ABNORMAL LOW (ref 4.22–5.81)
Retic Count, Absolute: 46.5 10*3/uL (ref 19.0–186.0)
Retic Ct Pct: 1.6 % (ref 0.4–3.1)

## 2022-01-17 LAB — URINALYSIS, ROUTINE W REFLEX MICROSCOPIC
Bilirubin Urine: NEGATIVE
Glucose, UA: NEGATIVE mg/dL
Ketones, ur: NEGATIVE mg/dL
Nitrite: NEGATIVE
Protein, ur: 30 mg/dL — AB
Specific Gravity, Urine: 1.019 (ref 1.005–1.030)
WBC, UA: >50 WBC/hpf — ABNORMAL HIGH (ref 0–5)
pH: 6 (ref 5.0–8.0)

## 2022-01-17 LAB — BASIC METABOLIC PANEL
Anion gap: 6 (ref 5–15)
BUN: 22 mg/dL (ref 8–23)
CO2: 23 mmol/L (ref 22–32)
Calcium: 8.5 mg/dL — ABNORMAL LOW (ref 8.9–10.3)
Chloride: 108 mmol/L (ref 98–111)
Creatinine, Ser: 1.16 mg/dL (ref 0.61–1.24)
GFR, Estimated: >60 mL/min (ref >60)
Glucose, Bld: 104 mg/dL — ABNORMAL HIGH (ref 70–99)
Potassium: 4.5 mmol/L (ref 3.5–5.1)
Sodium: 137 mmol/L (ref 135–145)

## 2022-01-17 LAB — VITAMIN B12: Vitamin B-12: 507 pg/mL (ref 180–914)

## 2022-01-17 LAB — GLUCOSE, CAPILLARY
Glucose-Capillary: 96 mg/dL (ref 70–99)
Glucose-Capillary: 112 mg/dL — ABNORMAL HIGH (ref 70–99)
Glucose-Capillary: 185 mg/dL — ABNORMAL HIGH (ref 70–99)
Glucose-Capillary: 108 mg/dL — ABNORMAL HIGH (ref 70–99)
Glucose-Capillary: 155 mg/dL — ABNORMAL HIGH (ref 70–99)
Glucose-Capillary: 110 mg/dL — ABNORMAL HIGH (ref 70–99)

## 2022-01-17 LAB — MAGNESIUM: Magnesium: 1.9 mg/dL (ref 1.7–2.4)

## 2022-01-17 LAB — FOLATE: Folate: 11.3 ng/mL (ref >5.9)

## 2022-01-17 LAB — FERRITIN: Ferritin: 134 ng/mL (ref 24–336)

## 2022-01-17 MED ORDER — ALUM & MAG HYDROXIDE-SIMETH 200-200-20 MG/5ML PO SUSP
30.0000 mL | ORAL | Status: DC | PRN
  Administered 2022-01-17: 30 mL
  Filled 2022-01-17: qty 30

## 2022-01-17 MED ORDER — APIXABAN 5 MG PO TABS
5.0000 mg | ORAL_TABLET | Freq: Two times a day (BID) | ORAL | Status: DC
  Administered 2022-01-17 – 2022-01-18 (×4): 5 mg via ORAL
  Filled 2022-01-17 (×4): qty 1

## 2022-01-17 MED ORDER — HALOPERIDOL LACTATE 5 MG/ML IJ SOLN
2.0000 mg | Freq: Four times a day (QID) | INTRAMUSCULAR | Status: DC | PRN
  Administered 2022-01-17: 2 mg via INTRAVENOUS
  Filled 2022-01-17: qty 1

## 2022-01-17 MED ORDER — SODIUM CHLORIDE 0.9 % IV SOLN
1.0000 g | INTRAVENOUS | Status: DC
  Administered 2022-01-17: 1 g via INTRAVENOUS
  Filled 2022-01-17 (×2): qty 10

## 2022-01-17 NOTE — Progress Notes (Signed)
Occupational Therapy Treatment Patient Details Name: [name] MRN: [id number] DOB: 07/18/1933 Today's Date: 01/17/2022   History of present illness Pt is an 88 year old male admitted 01/06/22 from SNF with AMS and slurred speech. Of note: recent admission 4/18-4/25 with C6 fx and pubic rami fx nonoperative mgmt. PMH includes: CAD, COPD, CVA, GERD, HTN, hx of CABG, PSA, a fib, hx of prostate cancer, DM, vertigo.   OT comments  Patient received in supine and agreeable to OT treatment. Patient instructed on log roll technique and max assist to get to EOB. Patient eager to attempt standing and performed 3 stands from EOB with max assist and tolerated ~30 seconds before requiring to sit. Side stepping attempted to get closer to [facility] but was unable to perform and performed lateral scooting towards HOB with max assist. Grooming and AROM exercises performed while supine. Patient is expected to return home with [place].    Recommendations for follow up therapy are one component of a multi-disciplinary discharge planning process, led by the attending physician.  Recommendations may be updated based on patient status, additional functional criteria and insurance authorization.    Follow Up Recommendations  Home health OT    Assistance Recommended at Discharge Other (comment) (24 hour supervision)  Patient can return home with the following  Two people to help with walking and/or transfers;A lot of help with bathing/dressing/bathroom;Direct supervision/assist for medications management;Direct supervision/assist for financial management;Assistance with feeding;Assist for transportation;Help with stairs or ramp for entrance   Equipment Recommendations  Wheelchair (measurements OT);Wheelchair cushion (measurements OT)    Recommendations for Other Services      Precautions / Restrictions Precautions Precautions: Fall;Cervical Precaution Booklet Issued: No Precaution Comments: watch BP Required Braces or  Orthoses: Cervical Brace Cervical Brace: Soft collar;For comfort Restrictions Weight Bearing Restrictions: Yes RLE Weight Bearing: Weight bearing as tolerated LLE Weight Bearing: Weight bearing as tolerated       Mobility Bed Mobility Overal bed mobility: Needs Assistance Bed Mobility: Rolling, Sidelying to Sit, Sit to Sidelying Rolling: Mod assist Sidelying to sit: Max assist     Sit to sidelying: Max assist General bed mobility comments: patient attempted to perform with less assistance but continues to require max assist to get to EOB and back to sidelying    Transfers Overall transfer level: Needs assistance Equipment used: Rolling walker (2 wheels) Transfers: Sit to/from Stand Sit to Stand: Max assist, From elevated surface           General transfer comment: max assist to perform 3 stands from EOB to RW     Balance Overall balance assessment: Needs assistance Sitting-balance support: Single extremity supported, Feet supported Sitting balance-Leahy Scale: Poor Sitting balance - Comments: min assist for balance with left lateral leaning Postural control: Left lateral lean Standing balance support: Bilateral upper extremity supported, Reliant on assistive device for balance Standing balance-Leahy Scale: Poor Standing balance comment: max assist for balance standing at walker with patient flexing at hips and knees                           ADL either performed or assessed with clinical judgement   ADL Overall ADL's : Needs assistance/impaired     Grooming: Wash/dry hands;Wash/dry face;Supervision/safety;Bed level Grooming Details (indicate cue type and reason): able to wash face and hands at bed level             Lower Body Dressing: Total assistance Lower Body Dressing Details (indicate cue type  and reason): to donn socks               General ADL Comments: declined address feeding with magic cup    Extremity/Trunk Assessment Upper  Extremity Assessment RUE Deficits / Details: grasp 4/5 (unable to hold utensils or food to bring to mouth), decreased shoulder flexion, pain with all movement RUE Coordination: decreased fine motor;decreased gross motor LUE Deficits / Details: grasp 4/5 (unable to hold utensils or food to bring to mouth), decreased shoulder flexion, pain with all movement LUE Coordination: decreased fine motor;decreased gross motor            Vision       Perception     Praxis      Cognition Arousal/Alertness: Awake/alert Behavior During Therapy: Flat affect Overall Cognitive Status: Impaired/Different from baseline Area of Impairment: Orientation, Attention, Memory, Following commands, Safety/judgement, Awareness, Problem solving                 Orientation Level: Disoriented to, Situation, Time Current Attention Level: Sustained Memory: Decreased recall of precautions, Decreased short-term memory Following Commands: Follows one step commands inconsistently Safety/Judgement: Decreased awareness of safety, Decreased awareness of deficits Awareness: Intellectual Problem Solving: Slow processing, Decreased initiation, Requires verbal cues, Requires tactile cues General Comments: agreeable to therapy, pleasant during visit        Exercises Exercises: General Upper Extremity General Exercises - Upper Extremity Shoulder Flexion: AROM, Both, 10 reps, Supine Shoulder ABduction: AROM, Both, 10 reps, Supine Elbow Flexion: AROM, Both, 10 reps, Supine    Shoulder Instructions       General Comments      Pertinent Vitals/ Pain       Pain Assessment Pain Assessment: Faces Faces Pain Scale: Hurts little more Pain Location: generalized with movement Pain Descriptors / Indicators: Discomfort, Grimacing Pain Intervention(s): Limited activity within patient's tolerance, Monitored during session, Repositioned  Home Living                                          Prior  Functioning/Environment              Frequency  Min 2X/week        Progress Toward Goals  OT Goals(current goals can now be found in the care plan section)  Progress towards OT goals: Progressing toward goals  Acute Rehab OT Goals Patient Stated Goal: go home OT Goal Formulation: With patient Time For Goal Achievement: 01/21/22 Potential to Achieve Goals: Fair ADL Goals Pt Will Perform Eating: with modified independence;with adaptive utensils;sitting Pt Will Perform Grooming: with set-up;with adaptive equipment;sitting Pt Will Perform Lower Body Bathing: with set-up;sitting/lateral leans;sit to/from stand;with adaptive equipment Pt Will Perform Upper Body Dressing: with min assist;sitting Pt Will Perform Lower Body Dressing: with mod assist;sitting/lateral leans;with adaptive equipment Pt Will Transfer to Toilet: with mod assist;stand pivot transfer;bedside commode Pt Will Perform Toileting - Clothing Manipulation and hygiene: with max assist;sit to/from stand Pt/caregiver will Perform Home Exercise Program: Right Upper extremity;Left upper extremity;With minimal assist;With written HEP provided;Increased ROM;Increased strength Additional ADL Goal #1: Pt will perform bed mobility and maintain sitting EOB at min A as precursor to participate in ADL  Plan Discharge plan remains appropriate;Discharge plan needs to be updated;Frequency needs to be updated    Co-evaluation                 AM-PAC OT "6 Clicks" Daily  Activity     Outcome Measure   Help from another person eating meals?: Total Help from another person taking care of personal grooming?: A Little Help from another person toileting, which includes using toliet, bedpan, or urinal?: Total Help from another person bathing (including washing, rinsing, drying)?: A Lot Help from another person to put on and taking off regular upper body clothing?: Total Help from another person to put on and taking off regular  lower body clothing?: Total 6 Click Score: 9    End of Session Equipment Utilized During Treatment: Cervical collar;Gait belt;Rolling walker (2 wheels)  OT Visit Diagnosis: Unsteadiness on feet (R26.81);Other abnormalities of gait and mobility (R26.89);Muscle weakness (generalized) (M62.81);History of falling (Z91.81);Feeding difficulties (R63.3);Other symptoms and signs involving cognitive function;Adult, failure to thrive (R62.7);Pain   Activity Tolerance Patient limited by pain   Patient Left in bed;with call bell/phone within reach;with bed alarm set   Nurse Communication Mobility status        Time: 9798-9211 OT Time Calculation (min): 26 min  Charges: OT General Charges $OT Visit: 1 Visit OT Treatments $Self Care/Home Management : 8-22 mins $Therapeutic Activity: 8-22 mins  Lodema Hong, [place]  Pager [phone] Office [phone]   Trixie Dredge 01/17/2022, 11:34 AM

## 2022-01-17 NOTE — Progress Notes (Addendum)
PROGRESS NOTE    [name]  UPJ:[id number] DOB: 12/03/1932 DOA: 01/06/2022 PCP: [name], DO   Brief Narrative:  Patient is a 88 year old male with history of paroxysmal A-fib, chronic systolic congestive heart failure, coronary artery disease status post CABG, sick sinus syndrome status post permanent pacemaker placement, COPD, diabetes type 2, hypertension, hyperlipidemia, chronic thrombocytopenia, recurrent UTI, sleep apnea not on CPAP who presented with altered mental status from home.  He was recently admitted here with polytrauma in April and was found to have C6 spinal fracture, multiple pelvic ring fracture and was discharged to  SNF, was discharged with hard collar.  He was recently treated for UTI.  At the SNF, patient continued to have poor oral intake, noted to have garbled speech and confused.  On presentation, CT head did not show any acute findings.  Patient was admitted for further evaluation.  Neurosurgery was consulted, no further intervention planned.  Hard collar transition to soft collar.   PT/OT recommending home health on discharge.  Palliative care is also closely following.  Patient has had PEG tube placement 5/24 and is able to tolerate bolus feeds from this.  He continues to have intermittent confusion and is noted to have significant urinary retention.  Noted recurrence of UTI today and Rocephin initiated.  Urine cultures pending.  Noted to have diarrhea as well.  Assessment & Plan:   Principal Problem:   Acute encephalopathy Active Problems:   Dysarthria   Acute kidney injury superimposed on chronic kidney disease (HCC)   C6 cervical fracture (HCC)   History of pelvic hematoma   Paroxysmal atrial fibrillation (HCC)   Closed fracture of multiple pubic rami, right, sequela   HFrEF (heart failure with reduced ejection fraction) ([place])   Controlled type 2 diabetes mellitus without complication, without long-term current use of insulin (HCC)   COPD  (chronic obstructive pulmonary disease) (HCC)   S/P CABG x 3   Second degree Mobitz II AV block   Hyperlipidemia with target LDL less than 100   OSA (obstructive sleep apnea)   Thrombocytopenia (HCC)   Protein-calorie malnutrition, severe  Assessment and Plan:   Acute metabolic encephalopathy: History of recent trauma, UTI which was treated with antibiotics.  Infection work-up has been negative.  Urinalysis not reassuring.  Chest x-ray did not show pneumonia.  CT head did not show any acute intracranial abnormalities.  MRI not ordered with low suspicion of stroke.  No neurological deficits.  Monitor mental status.  Delirium precautions.   Started on low-dose Seroquel but now stopped due sleepiness. Haldol for severe agitation only .  Mental status has remained stable since last few days, remains intermittently confused.  Perhaps this is complicated by UTI as noted below.   Dysphagia: Patient history of polytrauma with C6 fracture.  Wearing hard collar.  Dysphagia suspected to be from hard collar.  Hard collar  changed to soft collar.  Speech therapy reconsulted.  Dietitian following.  Was on calorie count. Very poor oral intake.  Currently receiving artificial feeding through core track and PEG tube has been placed with 5/24.  Appreciate dietitian input with bolus feeds initiated 5/25.   UTI: This is a recurrent issue, but appears to be related to urinary retention.  Foley catheter placed this a.m.  Started Rocephin empirically with urine cultures pending.  Likely will need to discharge with Foley catheter and outpatient urology follow-up.  Anemia: Some of this is likely dilutional, but is not far from baseline.  Continue to  monitor CBC and check anemia panel.  No suspicion of overt bleeding.   Recent history of polytrauma: Found to have C6 fracture on last admission.  Currently on soft collar.  Neurosurgery recommended outpatient follow-up.  Also found to have pelvic ring fracture with pelvic  hematoma last admission.  Was residing in a nursing facility but was planned for discharge to home.  Continue pain management, supportive care.  PT/OT recommended skilled nursing facility on discharge .   Paroxysmal A-fib: Currently in normal sinus rhythm.  Eliquis was discontinued on last admission after polytrauma but has been resumed at low dose now.   Chronic systolic congestive heart failure: Euvolemic.  Takes Lasix, Entresto, Aldactone, beta-blockers at home: These are on hold.  Blood pressure soft so we will continue holding for now.  Last echocardiogram  as on 04/2020 showed EF of 35%.  No need for IV fluid at this time.   Diabetes type 2: On metformin at home.  Currently on sliding scale insulin.  Monitor blood sugars   Hyperlipidemia: Takes crestor   History of depression: On Remeron, Effexor   Sick sinus syndrome: Status post pacemaker placement   OSA: Intolerant to CPAP   Thrombocytopenia: Chronic.  Stable   CKD stage IIIb: Baseline creatinine 1.4-1.6.  Kidney function  currently at baseline   Urinary retention: Foley placed, currently on tamsulosin.  Likely as a cause for recurrent UTIs as noted above.  Diarrhea: Likely related to tube feeds which have now been changed to bolus feedings and that should help.  Stool softeners have been discontinued.  Continue to monitor and consider GI pathogen panel as needed.   Goals of care: Palliative care closely following.  Remains full code.Palliative care met with family.  We recommend DNR.  PT/OT recommending home health-as this is what family would like to do.     DVT prophylaxis:Eliquis Code Status: Full Family Communication: Discussed with daughter Paulene Floor on phone 5/25, daughter [name] is HCPOA called her 5/26 Disposition Plan:  Status is: Inpatient Remains inpatient appropriate because: Need for IV medications and initiation of tube feeds.     Nutritional Assessment:   The patient's BMI is: Body mass index is 23.55  kg/m.[name]   Seen by dietician.  I agree with the assessment and plan as outlined below:   Nutrition Status: Nutrition Problem: Severe Malnutrition Etiology: chronic illness (CHF, COPD, dysphagia) Signs/Symptoms: severe fat depletion, severe muscle depletion, percent weight loss (14.1% weight loss in less than 7 months) Percent weight loss: 14.1 % Interventions: Refer to RD note for recommendations   .   Consultants:  IR   Procedures:  PEG tube placement 5/24   Antimicrobials:  Anti-infectives (From admission, onward)    Start     Dose/Rate Route Frequency Ordered Stop   01/17/22 1200  cefTRIAXone (ROCEPHIN) 1 g in sodium chloride 0.9 % 100 mL IVPB        1 g 200 mL/hr over 30 Minutes Intravenous Every 24 hours 01/17/22 1050     01/15/22 0600  vancomycin (VANCOCIN) IVPB 1000 mg/200 mL premix        1,000 mg 200 mL/hr over 60 Minutes Intravenous To Radiology 01/14/22 1049 01/15/22 1603       Subjective: Patient seen and evaluated today and is somewhat lethargic and somnolent.  He continues to have significant amounts of urinary retention of 600 mL noted this AM.  Cloudy urine output noted.  Objective: Vitals:   01/16/22 2300 01/17/22 0300 01/17/22 0500 01/17/22 0751  BP:  116/61 104/63  108/63  Pulse: 75 75  80  Resp: 15   18  Temp: 98.5 F (36.9 C) 98.6 F (37 C)  (!) 97.5 F (36.4 C)  TempSrc: Axillary Oral  Oral  SpO2: 98% 97%  99%  Weight:   76 kg     Intake/Output Summary (Last 24 hours) at 01/17/2022 1054 Last data filed at 01/17/2022 0900 Gross per 24 hour  Intake 525 ml  Output 1000 ml  Net -475 ml   Filed Weights   01/15/22 0351 01/16/22 0324 01/17/22 0500  Weight: 79.7 kg 76.6 kg 76 kg    Examination:  General exam: Appears lethargic, intermittently confused Respiratory system: Clear to auscultation. Respiratory effort normal. Cardiovascular system: S1 & S2 heard, RRR.  Gastrointestinal system: Abdomen is soft Central nervous system: Lethargic  and confused Extremities: No edema Skin: No significant lesions noted Psychiatry: Flat affect. Catheterized urine with cloudy, yellow urine output    Data Reviewed: I have personally reviewed following labs and imaging studies  CBC: Recent Labs  Lab 01/17/22 0550  WBC 7.4  HGB 9.2*  HCT 28.7*  MCV 97.0  PLT 98*   Basic Metabolic Panel: Recent Labs  Lab 01/12/22 0228 01/13/22 0811 01/13/22 1450 01/13/22 1450 01/14/22 0408 01/14/22 1648 01/15/22 0538 01/16/22 0443 01/17/22 0550  NA 142 138  --   --   --   --  137 135 137  K 3.3* 3.5  --   --   --   --  3.8 3.2* 4.5  CL 115* 113*  --   --   --   --  108 105 108  CO2 23 21*  --   --   --   --  $R'24 25 23  'tU$ GLUCOSE 136* 112*  --   --   --   --  122* 125* 104*  BUN 23 19  --   --   --   --  $R'21 18 22  'uQ$ CREATININE 1.71* 1.57*  --   --   --   --  1.34* 1.22 1.16  CALCIUM 8.5* 8.5*  --   --   --   --  8.5* 8.7* 8.5*  MG  --   --  1.7   < > 1.6* 2.1 2.0 1.9 1.9  PHOS  --   --  2.2*  --  2.3* 2.5  --  3.1  --    < > = values in this interval not displayed.   GFR: Estimated Creatinine Clearance: 48.7 mL/min (by C-G formula based on SCr of 1.16 mg/dL). Liver Function Tests: No results for input(s): AST, ALT, ALKPHOS, BILITOT, PROT, ALBUMIN in the last 168 hours. No results for input(s): LIPASE, AMYLASE in the last 168 hours. No results for input(s): AMMONIA in the last 168 hours. Coagulation Profile: No results for input(s): INR, PROTIME in the last 168 hours. Cardiac Enzymes: No results for input(s): CKTOTAL, CKMB, CKMBINDEX, TROPONINI in the last 168 hours. BNP (last 3 results) No results for input(s): PROBNP in the last 8760 hours. HbA1C: No results for input(s): HGBA1C in the last 72 hours. CBG: Recent Labs  Lab 01/16/22 1449 01/16/22 1933 01/16/22 2308 01/17/22 0311 01/17/22 0750  GLUCAP 101* 167* 203* 96 112*   Lipid Profile: No results for input(s): CHOL, HDL, LDLCALC, TRIG, CHOLHDL, LDLDIRECT in the last 72  hours. Thyroid Function Tests: No results for input(s): TSH, T4TOTAL, FREET4, T3FREE, THYROIDAB in the last 72 hours. Anemia Panel: No results for  input(s): VITAMINB12, FOLATE, FERRITIN, TIBC, IRON, RETICCTPCT in the last 72 hours. Sepsis Labs: No results for input(s): PROCALCITON, LATICACIDVEN in the last 168 hours.  No results found for this or any previous visit (from the past 240 hour(s)).       Radiology Studies: IR GASTROSTOMY TUBE MOD SED  Result Date: 01/15/2022 CLINICAL DATA:  Dysphagia and need for percutaneous gastrostomy tube for long-term nutrition. EXAM: PERCUTANEOUS GASTROSTOMY TUBE PLACEMENT ANESTHESIA/SEDATION: Moderate (conscious) sedation was employed during this procedure. A total of Versed 2.0 mg and Fentanyl 100 mcg was administered intravenously by radiology nursing. Moderate Sedation Time: 20 minutes. The patient's level of consciousness and vital signs were monitored continuously by radiology nursing throughout the procedure under my direct supervision. CONTRAST:  71mL OMNIPAQUE IOHEXOL 300 MG/ML  SOLN MEDICATIONS: 1 g IV vancomycin. IV antibiotic was administered in an appropriate time interval prior to needle puncture of the skin. FLUOROSCOPY TIME:  3 minutes and 30 seconds.  50.0 mGy. PROCEDURE: The procedure, risks, benefits, and alternatives were explained to the patient's daughter. Questions regarding the procedure were encouraged and answered. The patient's daughter understands and consents to the procedure. A 5-French catheter was then advanced through the patient's mouth under fluoroscopy into the esophagus and to the level of the stomach. This catheter was used to insufflate the stomach with air under fluoroscopy. The abdominal wall was prepped with chlorhexidine in a sterile fashion, and a sterile drape was applied covering the operative field. A sterile gown and sterile gloves were used for the procedure. Local anesthesia was provided with 1% Lidocaine. A skin  incision was made in the upper abdominal wall. Under fluoroscopy, an 18 gauge trocar needle was advanced into the stomach. Contrast injection was performed to confirm intraluminal position of the needle tip. A single T tack was then deployed in the lumen of the stomach. This was brought up to tension at the skin surface. Over a guidewire, a 9-French sheath was advanced into the lumen of the stomach. The wire was left in place as a safety wire. A loop snare device from a percutaneous gastrostomy kit was then advanced into the stomach. A floppy guide wire was advanced through the orogastric catheter under fluoroscopy in the stomach. The loop snare advanced through the percutaneous gastric access was used to snare the guide wire. This allowed withdrawal of the loop snare out of the patient's mouth by retraction of the orogastric catheter and wire. A 20-French bumper retention gastrostomy tube was looped around the snare device. It was then pulled back through the patient's mouth. The retention bumper was brought up to the anterior gastric wall. The T tack suture was cut at the skin. The exiting gastrostomy tube was cut to appropriate length and a feeding adapter applied. The catheter was injected with contrast material to confirm position and a fluoroscopic spot image saved. The tube was then flushed with saline. A dressing was applied over the gastrostomy exit site. COMPLICATIONS: None. FINDINGS: The stomach distended well with air allowing safe placement of the gastrostomy tube. After placement, the tip of the gastrostomy tube lies in the body of the stomach. IMPRESSION: Percutaneous gastrostomy with placement of a 20-French bumper retention tube in the body of the stomach. This tube can be used for percutaneous feeds beginning in 24 hours after placement. Electronically Signed   By: Aletta Edouard M.D.   On: 01/15/2022 16:01        Scheduled Meds:  amiodarone  200 mg Oral Daily   azelastine  1 spray Each  Nare BID   Chlorhexidine Gluconate Cloth  6 each Topical Daily   feeding supplement  237 mL Oral TID BM   feeding supplement (OSMOLITE 1.5 CAL)  355 mL Per Tube QID   free water  100 mL Per Tube QID   insulin aspart  0-9 Units Subcutaneous TID WC   mirtazapine  7.5 mg Oral QHS   mometasone-formoterol  2 puff Inhalation BID   nystatin  5 mL Oral QID   pantoprazole  40 mg Oral Daily   rosuvastatin  40 mg Oral QHS   tamsulosin  0.4 mg Oral Daily   venlafaxine XR  37.5 mg Oral QODAY   Continuous Infusions:  cefTRIAXone (ROCEPHIN)  IV       LOS: 7 days    Time spent: 35 minutes    Brad Lieurance Darleen Crocker, DO [facility]  If 7PM-7AM, please contact night-coverage [email or web address] 01/17/2022, 10:54 AM

## 2022-01-17 NOTE — Progress Notes (Signed)
IV Team to bedside, pt with adequate IV access at this time. Current IV not documented in flowsheet, assessed and documented.

## 2022-01-18 DIAGNOSIS — G9340 Encephalopathy, unspecified: Secondary | ICD-10-CM | POA: Diagnosis not present

## 2022-01-18 LAB — GLUCOSE, CAPILLARY
Glucose-Capillary: 101 mg/dL — ABNORMAL HIGH (ref 70–99)
Glucose-Capillary: 114 mg/dL — ABNORMAL HIGH (ref 70–99)
Glucose-Capillary: 150 mg/dL — ABNORMAL HIGH (ref 70–99)
Glucose-Capillary: 155 mg/dL — ABNORMAL HIGH (ref 70–99)
Glucose-Capillary: 205 mg/dL — ABNORMAL HIGH (ref 70–99)
Glucose-Capillary: 224 mg/dL — ABNORMAL HIGH (ref 70–99)

## 2022-01-18 LAB — URINE CULTURE: Culture: NO GROWTH

## 2022-01-18 LAB — CBC
HCT: 29.5 % — ABNORMAL LOW (ref 39.0–52.0)
Hemoglobin: 9.4 g/dL — ABNORMAL LOW (ref 13.0–17.0)
MCH: 31.3 pg (ref 26.0–34.0)
MCHC: 31.9 g/dL (ref 30.0–36.0)
MCV: 98.3 fL (ref 80.0–100.0)
Platelets: 92 10*3/uL — ABNORMAL LOW (ref 150–400)
RBC: 3 MIL/uL — ABNORMAL LOW (ref 4.22–5.81)
RDW: 19.1 % — ABNORMAL HIGH (ref 11.5–15.5)
WBC: 7.3 10*3/uL (ref 4.0–10.5)
nRBC: 0 % (ref 0.0–0.2)

## 2022-01-18 LAB — BASIC METABOLIC PANEL
Anion gap: 7 (ref 5–15)
BUN: 21 mg/dL (ref 8–23)
CO2: 22 mmol/L (ref 22–32)
Calcium: 8.4 mg/dL — ABNORMAL LOW (ref 8.9–10.3)
Chloride: 105 mmol/L (ref 98–111)
Creatinine, Ser: 1.12 mg/dL (ref 0.61–1.24)
GFR, Estimated: >60 mL/min (ref >60)
Glucose, Bld: 110 mg/dL — ABNORMAL HIGH (ref 70–99)
Potassium: 4.4 mmol/L (ref 3.5–5.1)
Sodium: 134 mmol/L — ABNORMAL LOW (ref 135–145)

## 2022-01-18 LAB — MAGNESIUM: Magnesium: 1.9 mg/dL (ref 1.7–2.4)

## 2022-01-18 MED ORDER — NYSTATIN 100000 UNIT/ML MT SUSP
5.0000 mL | Freq: Four times a day (QID) | OROMUCOSAL | Status: DC
  Filled 2022-01-18: qty 5

## 2022-01-18 MED ORDER — NYSTATIN 100000 UNIT/ML MT SUSP
5.0000 mL | Freq: Four times a day (QID) | OROMUCOSAL | Status: DC
  Administered 2022-01-18: 500000 [IU] via ORAL
  Filled 2022-01-18 (×4): qty 5

## 2022-01-18 NOTE — Progress Notes (Addendum)
PROGRESS NOTE    [name]  YWV:[id number] DOB: 04-06-1933 DOA: 01/06/2022 PCP: [name], DO   Brief Narrative:  Patient is a 88 year old male with history of paroxysmal A-fib, chronic systolic congestive heart failure, coronary artery disease status post CABG, sick sinus syndrome status post permanent pacemaker placement, COPD, diabetes type 2, hypertension, hyperlipidemia, chronic thrombocytopenia, recurrent UTI, sleep apnea not on CPAP who presented with altered mental status from home.  He was recently admitted here with polytrauma in April and was found to have C6 spinal fracture, multiple pelvic ring fracture and was discharged to  SNF, was discharged with hard collar.  He was recently treated for UTI.  At the SNF, patient continued to have poor oral intake, noted to have garbled speech and confused.  On presentation, CT head did not show any acute findings.  Patient was admitted for further evaluation.  Neurosurgery was consulted, no further intervention planned.  Hard collar transition to soft collar.   PT/OT recommending home health on discharge.  Palliative care is also closely following.  Patient has had PEG tube placement 5/24 and is able to tolerate bolus feeds from this.  He continues to have intermittent confusion and is noted to have significant urinary retention.  Noted recurrence of UTI today and Rocephin initiated.  Urine culture resulted negative.  Rocephin discontinued on 5/27.  Assessment & Plan:  Acute metabolic encephalopathy: Improving -Factorial in the setting of history of recent trauma, UTI which was treated with antibiotics.  Infection work-up has been negative.  -Chest x-ray did not show pneumonia.  CT head did not show any acute intracranial abnormalities.  MRI not ordered with low suspicion of stroke.  No neurological deficits.   -On delirium precautions.  Haldol as needed.  Dysphagia:  -Patient history of polytrauma with C6 fracture.   -Very poor  oral intake.   -Status post PEG tube has been placed with 5/24.  Appreciate dietitian input with bolus feeds initiated 5/25.   UTI:  Urinary retention:  - Foley catheter placed on 5/26. -Likely will need to discharge with Foley catheter and outpatient urology follow-up. -Urine culture: No growth.  Discontinued antibiotics   Normocytic anemia: Some of this is likely dilutional -H&H is currently stable.  No suspicion of overt bleeding.   Recent history of polytrauma:  -Found to have C6 fracture on last admission.  Currently on soft collar. -Neurosurgery recommended outpatient follow-up.   -Also found to have pelvic ring fracture with pelvic hematoma last admission.  Was residing in a nursing facility but was planned for discharge to home.  Continue pain management, supportive care.  PT/OT recommended skilled nursing facility on discharge-however patient's family would like to take him home with home health services.   Paroxysmal A-fib:  -Currently in normal sinus rhythm.  Eliquis was discontinued on last admission after polytrauma but has been resumed at low dose now.   Chronic systolic congestive heart failure: Euvolemic.   -Takes Lasix, Entresto, Aldactone, beta-blockers at home: These are on hold.  Blood pressure soft so we will continue holding for now.   -Last echocardiogram  as on 04/2020 showed EF of 35%.  No need for IV fluid at this time.   Diabetes type 2: Well controlled.  On metformin at home.  Last A1c 5.2% checked on 5/15.   -Currently on sliding scale insulin.  Monitor blood sugars   Hyperlipidemia: Takes crestor   History of depression: On Remeron, Effexor   Sick sinus syndrome: Status post pacemaker  placement   OSA: Intolerant to CPAP   Thrombocytopenia: Chronic.  Stable   CKD stage IIIb: Baseline creatinine 1.4-1.6.  Kidney function stable  Urinary retention: Foley placed, currently on tamsulosin.  Likely as a cause for recurrent UTIs as noted above.  Thrush:  started on Nystatin   Diarrhea: Likely related to tube feeds which have now been changed to bolus feedings and that should help.  -As per RN patient had only 1 small bowel movement this morning.  Goals of care: Palliative care closely following.  Remains full code.Palliative care met with family.  We recommend DNR.  PT/OT recommending home health-as this is what family would like to do.   Addendum: Had a long conversation with patient daughter [name] and whole family at bedside.  Patient's family would like to take him home with home health services.  They have few concerns including thrush, PEG tube feeding associated diarrhea and follow-up appointments with Ortho, urology and neurosurgery.  I answered all of their questions.  Plan to discharge home in 1 to 2 days.  DVT prophylaxis: Eliquis Code Status: Full code Family Communication:  None present at bedside.  Plan of care discussed with patient in length and he verbalized understanding and agreed with it.  I called patient's daughter on the phone and discussed plan of care.  She is comfortable taking patient home with home health services.  She would like his diarrhea and mentation to be improved prior to the discharge.  Disposition Plan: Home with home health services  Consultants:  IR  Procedures:  PEG tube placement on 5/24   Status is: Inpatient     Subjective: Patient seen and examined.  Alert and awake.  Able to tell me his date of birth.  Oriented to person but not time and place.  No fever.  No acute event overnight.  Objective: Vitals:   01/18/22 0316 01/18/22 0730 01/18/22 0809 01/18/22 1049  BP: (!) 105/57 (!) 100/59  (!) 92/52  Pulse: 76 99  81  Resp:  17  20  Temp: 97.7 F (36.5 C) 97.6 F (36.4 C)  (!) 97.4 F (36.3 C)  TempSrc:  Axillary  Oral  SpO2: 97% 100% 100% 99%  Weight:        Intake/Output Summary (Last 24 hours) at 01/18/2022 1304 Last data filed at 01/18/2022 1300 Gross per 24 hour  Intake  810 ml  Output 950 ml  Net -140 ml   Filed Weights   01/15/22 0351 01/16/22 0324 01/17/22 0500  Weight: 79.7 kg 76.6 kg 76 kg    Examination:  General exam: Appears calm and comfortable, on room air, alert and awake Respiratory system: Clear to auscultation. Respiratory effort normal. Cardiovascular system: S1 & S2 heard, RRR. No JVD, murmurs, rubs, gallops or clicks. No pedal edema. Gastrointestinal system: Abdomen is nondistended, soft and nontender.  PEG tube site: No signs of active bleeding or discharge.   Central nervous system: Alert and awake Extremities: Moves all 4 extremities equally  skin: Multiple bruises noted on the arms.   Data Reviewed: I have personally reviewed following labs and imaging studies  CBC: Recent Labs  Lab 01/17/22 0550 01/18/22 0323  WBC 7.4 7.3  HGB 9.2* 9.4*  HCT 28.7* 29.5*  MCV 97.0 98.3  PLT 98* 92*   Basic Metabolic Panel: Recent Labs  Lab 01/13/22 0811 01/13/22 1450 01/13/22 1450 01/14/22 0408 01/14/22 1648 01/15/22 0538 01/16/22 0443 01/17/22 0550 01/18/22 0323  NA 138  --   --   --   --  137 135 137 134*  K 3.5  --   --   --   --  3.8 3.2* 4.5 4.4  CL 113*  --   --   --   --  108 105 108 105  CO2 21*  --   --   --   --  _0 GLUCOSE 112*  --   --   --   --  122* 125* 104* 110*  BUN 19  --   --   --   --  _1 CREATININE 1.57*  --   --   --   --  1.34* 1.22 1.16 1.12  CALCIUM 8.5*  --   --   --   --  8.5* 8.7* 8.5* 8.4*  MG  --  1.7   < > 1.6* 2.1 2.0 1.9 1.9 1.9  PHOS  --  2.2*  --  2.3* 2.5  --  3.1  --   --    < > = values in this interval not displayed.   GFR: Estimated Creatinine Clearance: 50.4 mL/min (by C-G formula based on SCr of 1.12 mg/dL). Liver Function Tests: No results for input(s): AST, ALT, ALKPHOS, BILITOT, PROT, ALBUMIN in the last 168 hours. No results for input(s): LIPASE, AMYLASE in the last 168 hours. No results for input(s): AMMONIA in the last 168 hours. Coagulation  Profile: No results for input(s): INR, PROTIME in the last 168 hours. Cardiac Enzymes: No results for input(s): CKTOTAL, CKMB, CKMBINDEX, TROPONINI in the last 168 hours. BNP (last 3 results) No results for input(s): PROBNP in the last 8760 hours. HbA1C: No results for input(s): HGBA1C in the last 72 hours. CBG: Recent Labs  Lab 01/17/22 1919 01/17/22 2326 01/18/22 0318 01/18/22 0826 01/18/22 1116  GLUCAP 155* 110* 101* 114* 205*   Lipid Profile: No results for input(s): CHOL, HDL, LDLCALC, TRIG, CHOLHDL, LDLDIRECT in the last 72 hours. Thyroid Function Tests: No results for input(s): TSH, T4TOTAL, FREET4, T3FREE, THYROIDAB in the last 72 hours. Anemia Panel: Recent Labs    01/17/22 0550  VITAMINB12 507  FOLATE 11.3  FERRITIN 134  TIBC 203*  IRON 32*  RETICCTPCT 1.6   Sepsis Labs: No results for input(s): PROCALCITON, LATICACIDVEN in the last 168 hours.  Recent Results (from the past 240 hour(s))  Urine Culture     Status: None   Collection Time: 01/17/22  8:54 AM   Specimen: Urine, Catheterized  Result Value Ref Range Status   Specimen Description URINE, CATHETERIZED  Final   Special Requests NONE  Final   Culture   Final    NO GROWTH Performed at [facility], [address] [place], [place] [address]    Report Status 01/18/2022 FINAL  Final      Radiology Studies: DG Abd 1 View  Result Date: 01/17/2022 CLINICAL DATA:  Abdominal pain. EXAM: ABDOMEN - 1 VIEW COMPARISON:  01/15/2022 FINDINGS: Gastrostomy tube is identified within the left upper quadrant of the abdomen. Interval decrease and enteric contrast material within the colon. Improving gaseous distension of the colon. Transverse colon measures 8 cm on today's exam versus 10 cm previously. Cholecystectomy clips. IMPRESSION: Improving gaseous distension of the colon with decrease amount of enteric contrast material within the colon. Transverse colon measures 8 cm on today's exam versus 10 cm  previously. Electronically Signed   By: Kerby Moors M.D.   On: 01/17/2022 12:28    Scheduled Meds:  amiodarone  200 mg Oral Daily   apixaban  5 mg Oral BID   azelastine  1 spray Each Nare BID   Chlorhexidine Gluconate Cloth  6 each Topical Daily   feeding supplement  237 mL Oral TID BM   feeding supplement (OSMOLITE 1.5 CAL)  355 mL Per Tube QID   free water  100 mL Per Tube QID   insulin aspart  0-9 Units Subcutaneous TID WC   mirtazapine  7.5 mg Oral QHS   mometasone-formoterol  2 puff Inhalation BID   pantoprazole  40 mg Oral Daily   rosuvastatin  40 mg Oral QHS   tamsulosin  0.4 mg Oral Daily   venlafaxine XR  37.5 mg Oral QODAY   Continuous Infusions:   LOS: 8 days   Time spent: 35 minutes   Sayf Kerner Loann Quill, MD [facility]  If 7PM-7AM, please contact night-coverage [email or web address] 01/18/2022, 1:04 PM

## 2022-01-19 DIAGNOSIS — G9340 Encephalopathy, unspecified: Secondary | ICD-10-CM | POA: Diagnosis not present

## 2022-01-19 LAB — GLUCOSE, CAPILLARY
Glucose-Capillary: 135 mg/dL — ABNORMAL HIGH (ref 70–99)
Glucose-Capillary: 124 mg/dL — ABNORMAL HIGH (ref 70–99)
Glucose-Capillary: 163 mg/dL — ABNORMAL HIGH (ref 70–99)
Glucose-Capillary: 171 mg/dL — ABNORMAL HIGH (ref 70–99)
Glucose-Capillary: 170 mg/dL — ABNORMAL HIGH (ref 70–99)
Glucose-Capillary: 169 mg/dL — ABNORMAL HIGH (ref 70–99)

## 2022-01-19 LAB — CBC
HCT: 28.9 % — ABNORMAL LOW (ref 39.0–52.0)
Hemoglobin: 8.9 g/dL — ABNORMAL LOW (ref 13.0–17.0)
MCH: 30.6 pg (ref 26.0–34.0)
MCHC: 30.8 g/dL (ref 30.0–36.0)
MCV: 99.3 fL (ref 80.0–100.0)
Platelets: 118 10*3/uL — ABNORMAL LOW (ref 150–400)
RBC: 2.91 MIL/uL — ABNORMAL LOW (ref 4.22–5.81)
RDW: 19 % — ABNORMAL HIGH (ref 11.5–15.5)
WBC: 8 10*3/uL (ref 4.0–10.5)
nRBC: 0 % (ref 0.0–0.2)

## 2022-01-19 LAB — BASIC METABOLIC PANEL
Anion gap: 4 — ABNORMAL LOW (ref 5–15)
BUN: 24 mg/dL — ABNORMAL HIGH (ref 8–23)
CO2: 27 mmol/L (ref 22–32)
Calcium: 8.7 mg/dL — ABNORMAL LOW (ref 8.9–10.3)
Chloride: 106 mmol/L (ref 98–111)
Creatinine, Ser: 1.12 mg/dL (ref 0.61–1.24)
GFR, Estimated: >60 mL/min (ref >60)
Glucose, Bld: 171 mg/dL — ABNORMAL HIGH (ref 70–99)
Potassium: 5 mmol/L (ref 3.5–5.1)
Sodium: 137 mmol/L (ref 135–145)

## 2022-01-19 LAB — MAGNESIUM: Magnesium: 2 mg/dL (ref 1.7–2.4)

## 2022-01-19 MED ORDER — SENNOSIDES-DOCUSATE SODIUM 8.6-50 MG PO TABS
1.0000 | ORAL_TABLET | Freq: Every evening | ORAL | Status: DC | PRN
  Administered 2022-01-20: 1
  Filled 2022-01-19: qty 1

## 2022-01-19 MED ORDER — ROSUVASTATIN CALCIUM 20 MG PO TABS
40.0000 mg | ORAL_TABLET | Freq: Every day | ORAL | Status: DC
  Administered 2022-01-19 – 2022-01-20 (×2): 40 mg
  Filled 2022-01-19 (×2): qty 2

## 2022-01-19 MED ORDER — MIRTAZAPINE 7.5 MG PO TABS
7.5000 mg | ORAL_TABLET | Freq: Every day | ORAL | Status: DC
  Administered 2022-01-19 – 2022-01-20 (×2): 7.5 mg
  Filled 2022-01-19 (×4): qty 1

## 2022-01-19 MED ORDER — NYSTATIN 100000 UNIT/ML MT SUSP
5.0000 mL | Freq: Four times a day (QID) | OROMUCOSAL | Status: DC
  Filled 2022-01-19: qty 5

## 2022-01-19 MED ORDER — AMIODARONE HCL 200 MG PO TABS
200.0000 mg | ORAL_TABLET | Freq: Every day | ORAL | Status: DC
  Administered 2022-01-19 – 2022-01-21 (×3): 200 mg
  Filled 2022-01-19 (×3): qty 1

## 2022-01-19 MED ORDER — PANTOPRAZOLE 2 MG/ML SUSPENSION
40.0000 mg | Freq: Every day | ORAL | Status: DC
  Administered 2022-01-19 – 2022-01-21 (×3): 40 mg
  Filled 2022-01-19 (×3): qty 20

## 2022-01-19 MED ORDER — OXYCODONE HCL 5 MG PO TABS
5.0000 mg | ORAL_TABLET | ORAL | Status: DC | PRN
  Administered 2022-01-19 – 2022-01-21 (×7): 5 mg
  Filled 2022-01-19 (×7): qty 1

## 2022-01-19 MED ORDER — NYSTATIN 100000 UNIT/ML MT SUSP
5.0000 mL | Freq: Four times a day (QID) | OROMUCOSAL | Status: DC
  Administered 2022-01-19 – 2022-01-20 (×2): 500000 [IU]
  Filled 2022-01-19 (×4): qty 5

## 2022-01-19 MED ORDER — APIXABAN 5 MG PO TABS
5.0000 mg | ORAL_TABLET | Freq: Two times a day (BID) | ORAL | Status: DC
  Administered 2022-01-19 – 2022-01-21 (×5): 5 mg
  Filled 2022-01-19 (×5): qty 1

## 2022-01-19 NOTE — Progress Notes (Signed)
PROGRESS NOTE    [name]  VHQ:[id number] DOB: 16-Aug-1933 DOA: 01/06/2022 PCP: [name], DO   Brief Narrative:  Patient is a 88 year old male with history of paroxysmal A-fib, chronic systolic congestive heart failure, coronary artery disease status post CABG, sick sinus syndrome status post permanent pacemaker placement, COPD, diabetes type 2, hypertension, hyperlipidemia, chronic thrombocytopenia, recurrent UTI, sleep apnea not on CPAP who presented with altered mental status from home.  He was recently admitted here with polytrauma in April and was found to have C6 spinal fracture, multiple pelvic ring fracture and was discharged to  SNF, was discharged with hard collar.  He was recently treated for UTI.  At the SNF, patient continued to have poor oral intake, noted to have garbled speech and confused.  On presentation, CT head did not show any acute findings.  Patient was admitted for further evaluation.  Neurosurgery was consulted, no further intervention planned.  Hard collar transition to soft collar.   PT/OT recommending home health on discharge.  Palliative care is also closely following.  Patient has had PEG tube placement 5/24 and is able to tolerate bolus feeds from this.  He continues to have intermittent confusion and is noted to have significant urinary retention.  Noted recurrence of UTI today and Rocephin initiated.  Urine culture resulted negative.  Rocephin discontinued on 5/27.  Assessment & Plan:  Acute metabolic encephalopathy: Improving -Multifactorial in the setting of history of recent trauma, UTI which was treated with antibiotics.  Infection work-up has been negative.  -Chest x-ray did not show pneumonia.  CT head did not show any acute intracranial abnormalities.  MRI not ordered with low suspicion of stroke.  No neurological deficits.   -On delirium precautions.  Haldol as needed.  Dysphagia:  -Patient history of polytrauma with C6 fracture.   -Very  poor oral intake.   -Status post PEG tube has been placed with 5/24.  Appreciate dietitian input with bolus feeds initiated 5/25. -Patient continues to have problem with swallowing pills.  We will keep him n.p.o.  SLP consulted.  Urinary retention:  - Foley catheter placed on 5/26. -Likely will need to discharge with Foley catheter and outpatient urology follow-up. -Urine culture: No growth.  Discontinued antibiotics on 5/27   Normocytic anemia: Some of this is likely dilutional -H&H is currently stable.  No suspicion of overt bleeding.   Recent history of polytrauma:  -Found to have C6 fracture on last admission.  Currently on soft collar. -Neurosurgery recommended outpatient follow-up.   -Also found to have pelvic ring fracture with pelvic hematoma last admission.  Was residing in a nursing facility but was planned for discharge to home.  Continue pain management, supportive care.  PT/OT recommended skilled nursing facility on discharge-however patient's family would like to take him home with home health services.   Paroxysmal A-fib:  -Currently in normal sinus rhythm.  Eliquis was discontinued on last admission after polytrauma but has been resumed at low dose now.   Chronic systolic congestive heart failure: Euvolemic.   -Takes Lasix, Entresto, Aldactone, beta-blockers at home: These are on hold.  Blood pressure soft so we will continue holding for now.   -Last echocardiogram  as on 04/2020 showed EF of 35%.  No need for IV fluid at this time.   Diabetes type 2: Well controlled.  On metformin at home.  Last A1c 5.2% checked on 5/15.   -Currently on sliding scale insulin.  Monitor blood sugars   Hyperlipidemia: Takes crestor  History of depression: On Remeron, Effexor   Sick sinus syndrome: Status post pacemaker placement   OSA: Intolerant to CPAP   Thrombocytopenia: Chronic.  Stable   CKD stage IIIb: Baseline creatinine 1.4-1.6.  Kidney function stable  Thrush: started on  Nystatin   Diarrhea: Likely related to tube feeds  -Resolved now  Goals of care: Palliative care closely following.  Remains full code.Palliative care met with family.  We recommend DNR.  PT/OT recommending home health-as this is what family would like to do.  DVT prophylaxis: Eliquis Code Status: Full code Family Communication:  None present at bedside.  Plan of care discussed with patient in length and he verbalized understanding and agreed with it.  I called patient's daughter and discussed plan of care. Likely DC home with home health services tomorrow if he remains stable.  Disposition Plan: Home with home health services  Consultants:  IR  Procedures:  PEG tube placement on 5/24   Status is: Inpatient     Subjective: Patient seen and examined.  Sleepy but arousable.  Denies any complaints.  No acute events overnight.  Per RN this morning: Patient was having some difficulty swallowing pills.  He kept n.p.o.  SLP consulted.  Medications will be given via PEG tube.  Objective: Vitals:   01/19/22 0500 01/19/22 0755 01/19/22 0832 01/19/22 1201  BP:  (!) 107/56  115/60  Pulse:  81 84 84  Resp:  _0 Temp:  98.6 F (37 C)  97.7 F (36.5 C)  TempSrc:  Axillary  Axillary  SpO2:  94% 94% 92%  Weight: 78 kg       Intake/Output Summary (Last 24 hours) at 01/19/2022 1355 Last data filed at 01/19/2022 1214 Gross per 24 hour  Intake 710 ml  Output 650 ml  Net 60 ml    Filed Weights   01/16/22 0324 01/17/22 0500 01/19/22 0500  Weight: 76.6 kg 76 kg 78 kg    Examination:  General exam: Appears calm and comfortable, on room air, sleepy but arousable respiratory system: Clear to auscultation. Respiratory effort normal. Cardiovascular system: S1 & S2 heard, RRR. No JVD, murmurs, rubs, gallops or clicks. No pedal edema. Gastrointestinal system: Abdomen is nondistended, soft and nontender.  PEG tube site: No signs of active bleeding or discharge.   Central nervous  system: Sleepy but arousable.  Answers questions appropriately.   Extremities: Moves all 4 extremities equally  skin: Multiple bruises noted on the arms.   Data Reviewed: I have personally reviewed following labs and imaging studies  CBC: Recent Labs  Lab 01/17/22 0550 01/18/22 0323 01/19/22 0226  WBC 7.4 7.3 8.0  HGB 9.2* 9.4* 8.9*  HCT 28.7* 29.5* 28.9*  MCV 97.0 98.3 99.3  PLT 98* 92* 118*    Basic Metabolic Panel: Recent Labs  Lab 01/13/22 1450 01/14/22 0408 01/14/22 1648 01/15/22 0538 01/16/22 0443 01/17/22 0550 01/18/22 0323 01/19/22 0226  NA  --   --   --  137 135 137 134* 137  K  --   --   --  3.8 3.2* 4.5 4.4 5.0  CL  --   --   --  108 105 108 105 106  CO2  --   --   --  _1 GLUCOSE  --   --   --  122* 125* 104* 110* 171*  BUN  --   --   --  _2 24*  CREATININE  --   --   --  1.34* 1.22 1.16 1.12 1.12  CALCIUM  --   --   --  8.5* 8.7* 8.5* 8.4* 8.7*  MG 1.7 1.6* 2.1 2.0 1.9 1.9 1.9 2.0  PHOS 2.2* 2.3* 2.5  --  3.1  --   --   --     GFR: Estimated Creatinine Clearance: 50.4 mL/min (by C-G formula based on SCr of 1.12 mg/dL). Liver Function Tests: No results for input(s): AST, ALT, ALKPHOS, BILITOT, PROT, ALBUMIN in the last 168 hours. No results for input(s): LIPASE, AMYLASE in the last 168 hours. No results for input(s): AMMONIA in the last 168 hours. Coagulation Profile: No results for input(s): INR, PROTIME in the last 168 hours. Cardiac Enzymes: No results for input(s): CKTOTAL, CKMB, CKMBINDEX, TROPONINI in the last 168 hours. BNP (last 3 results) No results for input(s): PROBNP in the last 8760 hours. HbA1C: No results for input(s): HGBA1C in the last 72 hours. CBG: Recent Labs  Lab 01/18/22 1936 01/18/22 2356 01/19/22 0332 01/19/22 0757 01/19/22 1212  GLUCAP 150* 224* 135* 124* 163*    Lipid Profile: No results for input(s): CHOL, HDL, LDLCALC, TRIG, CHOLHDL, LDLDIRECT in the last 72 hours. Thyroid Function  Tests: No results for input(s): TSH, T4TOTAL, FREET4, T3FREE, THYROIDAB in the last 72 hours. Anemia Panel: Recent Labs    01/17/22 0550  VITAMINB12 507  FOLATE 11.3  FERRITIN 134  TIBC 203*  IRON 32*  RETICCTPCT 1.6    Sepsis Labs: No results for input(s): PROCALCITON, LATICACIDVEN in the last 168 hours.  Recent Results (from the past 240 hour(s))  Urine Culture     Status: None   Collection Time: 01/17/22  8:54 AM   Specimen: Urine, Catheterized  Result Value Ref Range Status   Specimen Description URINE, CATHETERIZED  Final   Special Requests NONE  Final   Culture   Final    NO GROWTH Performed at [facility], [address], [place], [place] [address]    Report Status 01/18/2022 FINAL  Final       Radiology Studies: No results found.  Scheduled Meds:  amiodarone  200 mg Per Tube Daily   apixaban  5 mg Per Tube BID   azelastine  1 spray Each Nare BID   Chlorhexidine Gluconate Cloth  6 each Topical Daily   feeding supplement  237 mL Oral TID BM   feeding supplement (OSMOLITE 1.5 CAL)  355 mL Per Tube QID   insulin aspart  0-9 Units Subcutaneous TID WC   mirtazapine  7.5 mg Per Tube QHS   mometasone-formoterol  2 puff Inhalation BID   nystatin  5 mL Per Tube QID   pantoprazole sodium  40 mg Per Tube Daily   rosuvastatin  40 mg Per Tube QHS   tamsulosin  0.4 mg Oral Daily   venlafaxine XR  37.5 mg Oral QODAY   Continuous Infusions:   LOS: 9 days   Time spent: 35 minutes   Alera Quevedo Loann Quill, MD [facility]  If 7PM-7AM, please contact night-coverage [email or web address] 01/19/2022, 1:55 PM

## 2022-01-19 NOTE — Progress Notes (Signed)
Pt appears to have increased difficulty swallowing continuously coughing with small sips of water and unable to swallow medications. Pt refuses applesauce. VS WNL. Lung sounds clear/diminished. MD notified to request meds be changed to per tube and SLP eval. See new orders. Pharmacy called and will change medications to per tube.

## 2022-01-19 NOTE — Progress Notes (Signed)
Speech Language Pathology Treatment: Dysphagia  Patient Details Name: CHRITOPHER Hunter MRN: [id number] DOB: 1932/09/07 Today's Date: 01/19/2022 Time: 8453-6468 SLP Time Calculation (min) (ACUTE ONLY): 10 min  Assessment / Plan / Recommendation Clinical Impression  New orders received today for clinical swallowing evaluation  - pt has been on caseload.  RN helped to clarify that he was having more trouble with eating today, coughing more frequently with PO intake. Attempted to assess at bedside. He has had PEG placed since SLP last saw him. Per chart review, its placement was related to poor PO intake, not as a consequence of dysphagia. Today he was not participatory and could not be encouraged to eat or drink despite multiple attempts to persuade.  He accepted one sip of water, became irritable and declined any further POs.    Recommend continuing to offer POs (dys2/thin as previously recommended) when Mr. Zinn is receptive.  SLP will f/u this week and attempt to re-assess.  D/W RN.    HPI HPI: [name] is a 88 y.o. male who presented with AMS and slurred speech.  CXR and Head CT 5/15 negative for acute findings.  Pt recently admitted for fall 4/18-4/25 with C5 and ramus fractures and d/c'd to SNF.  He has had poor PO intake since being at the SNF. On admission, he was alert, but not oriented which is not his baseline. Baseline A&O x 4. Pt recently treated for UTI.  PMH: PAF on Eliquis, HFrEF (EF 35% by TTE 04/25/2020), CAD s/p CABG, Mobitz 2 second-degree AV block s/p PPM, COPD, T2DM, HTN, HLD, thrombocytopenia, recurrent UTIs, OSA not tolerating CPAP. SLP evaluation 12/15/21: articulatory imprecision with reduced speech intelligibility, cognitive impairments noted in temporal orientation, memory, awareness, attention, and executive function; ST f/u for cog/dysphagia tx.      SLP Plan  Continue with current plan of care      Recommendations for follow up therapy are one component of a  multi-disciplinary discharge planning process, led by the attending physician.  Recommendations may be updated based on patient status, additional functional criteria and insurance authorization.    Recommendations  Diet recommendations: Dysphagia 2 (fine chop);Thin liquid Liquids provided via: Cup;Straw Medication Administration: Via alternative means Supervision: Staff to assist with self feeding Compensations: Slow rate;Small sips/bites Postural Changes and/or Swallow Maneuvers: Seated upright 90 degrees                Oral Care Recommendations: Oral care BID Follow Up Recommendations: Home health SLP SLP Visit Diagnosis: Dysphagia, unspecified (R13.10) Plan: Continue with current plan of care         Ronita Hargreaves L. Tivis Ringer, [place] Office number [phone] Pager [phone]   Assunta Curtis  01/19/2022, 3:33 PM

## 2022-01-19 NOTE — Progress Notes (Signed)
CSW was notified by MD that patient will be discharged home on Monday 5/29. Patient is active with [facility] for [facility] services.  Madilyn Fireman, MSW, [name] Transitions of Care  Clinical Social Worker II [phone]

## 2022-01-20 ENCOUNTER — Inpatient Hospital Stay (HOSPITAL_COMMUNITY): Payer: Medicare Other

## 2022-01-20 DIAGNOSIS — G309 Alzheimer's disease, unspecified: Secondary | ICD-10-CM

## 2022-01-20 DIAGNOSIS — E43 Unspecified severe protein-calorie malnutrition: Secondary | ICD-10-CM

## 2022-01-20 DIAGNOSIS — F02B11 Dementia in other diseases classified elsewhere, moderate, with agitation: Secondary | ICD-10-CM

## 2022-01-20 DIAGNOSIS — G9340 Encephalopathy, unspecified: Secondary | ICD-10-CM | POA: Diagnosis not present

## 2022-01-20 LAB — GLUCOSE, CAPILLARY
Glucose-Capillary: 151 mg/dL — ABNORMAL HIGH (ref 70–99)
Glucose-Capillary: 147 mg/dL — ABNORMAL HIGH (ref 70–99)
Glucose-Capillary: 153 mg/dL — ABNORMAL HIGH (ref 70–99)
Glucose-Capillary: 248 mg/dL — ABNORMAL HIGH (ref 70–99)
Glucose-Capillary: 225 mg/dL — ABNORMAL HIGH (ref 70–99)
Glucose-Capillary: 188 mg/dL — ABNORMAL HIGH (ref 70–99)

## 2022-01-20 LAB — CBC
HCT: 26.6 % — ABNORMAL LOW (ref 39.0–52.0)
Hemoglobin: 8.4 g/dL — ABNORMAL LOW (ref 13.0–17.0)
MCH: 31.5 pg (ref 26.0–34.0)
MCHC: 31.6 g/dL (ref 30.0–36.0)
MCV: 99.6 fL (ref 80.0–100.0)
Platelets: 109 10*3/uL — ABNORMAL LOW (ref 150–400)
RBC: 2.67 MIL/uL — ABNORMAL LOW (ref 4.22–5.81)
RDW: 19.2 % — ABNORMAL HIGH (ref 11.5–15.5)
WBC: 8.8 10*3/uL (ref 4.0–10.5)
nRBC: 0 % (ref 0.0–0.2)

## 2022-01-20 MED ORDER — VENLAFAXINE HCL 37.5 MG PO TABS
18.2500 mg | ORAL_TABLET | Freq: Two times a day (BID) | ORAL | Status: DC
  Administered 2022-01-21: 18.75 mg
  Filled 2022-01-20: qty 1

## 2022-01-20 MED ORDER — NYSTATIN 100000 UNIT/ML MT SUSP
5.0000 mL | Freq: Four times a day (QID) | OROMUCOSAL | Status: DC
  Administered 2022-01-20 – 2022-01-21 (×2): 500000 [IU] via ORAL
  Filled 2022-01-20 (×3): qty 5

## 2022-01-20 MED ORDER — ACETAMINOPHEN 160 MG/5ML PO SOLN
1000.0000 mg | Freq: Four times a day (QID) | ORAL | Status: DC
  Administered 2022-01-20 – 2022-01-21 (×2): 1000 mg
  Filled 2022-01-20 (×2): qty 40.6

## 2022-01-20 NOTE — Progress Notes (Signed)
Occupational Therapy Treatment Patient Details Name: [name] MRN: [id number] DOB: 02-15-1933 Today's Date: 01/20/2022   History of present illness Pt is an 88 year old male admitted 01/06/22 from SNF with AMS and slurred speech. Of note: recent admission 4/18-4/25 with C6 fx and pubic rami fx nonoperative mgmt. PMH includes: CAD, COPD, CVA, GERD, HTN, hx of CABG, PSA, a fib, hx of prostate cancer, DM, vertigo.   OT comments  Patient was max assist to get to EOB and once on EOB demonstrated left lateral leaning requiring cues and physical assistance to correct. Patient performed standing from EOB with max assist x2 with poor posture and unable to side step. Once returned to supine attempted to have patient eat but stated, "I will eat when he gets home" but was willing to drink water while managing small bottle of water with straw and able to perform face hygiene. Patient would benefit from further OT services to increase mobility and self care tasks.    Recommendations for follow up therapy are one component of a multi-disciplinary discharge planning process, led by the attending physician.  Recommendations may be updated based on patient status, additional functional criteria and insurance authorization.    Follow Up Recommendations  Home health OT    Assistance Recommended at Discharge Other (comment) (24 hour supervision)  Patient can return home with the following  Two people to help with walking and/or transfers;A lot of help with bathing/dressing/bathroom;Direct supervision/assist for medications management;Direct supervision/assist for financial management;Assistance with feeding;Assist for transportation;Help with stairs or ramp for entrance   Equipment Recommendations  Wheelchair (measurements OT);Wheelchair cushion (measurements OT)    Recommendations for Other Services      Precautions / Restrictions Precautions Precautions: Fall;Cervical Precaution Booklet Issued:  No Precaution Comments: watch BP Required Braces or Orthoses: Cervical Brace Cervical Brace: Soft collar;For comfort Restrictions Weight Bearing Restrictions: Yes RLE Weight Bearing: Weight bearing as tolerated LLE Weight Bearing: Weight bearing as tolerated       Mobility Bed Mobility Overal bed mobility: Needs Assistance Bed Mobility: Supine to Sit, Sit to Supine     Supine to sit: Max assist, HOB elevated Sit to supine: Max assist, HOB elevated   General bed mobility comments: patient attempted to assist with pullingup    Transfers Overall transfer level: Needs assistance Equipment used: Rolling walker (2 wheels) Transfers: Sit to/from Stand Sit to Stand: Max assist, +2 physical assistance, From elevated surface           General transfer comment: attempted side steps to move toward head of bed however the pt is unable to step     Balance Overall balance assessment: Needs assistance Sitting-balance support: No upper extremity supported, Feet supported, Single extremity supported Sitting balance-Leahy Scale: Poor Sitting balance - Comments: min-modA, left lateral lean Postural control: Left lateral lean Standing balance support: Bilateral upper extremity supported, Reliant on assistive device for balance Standing balance-Leahy Scale: Poor Standing balance comment: maxA x2, left and posterior lean                           ADL either performed or assessed with clinical judgement   ADL Overall ADL's : Needs assistance/impaired Eating/Feeding: Moderate assistance Eating/Feeding Details (indicate cue type and reason): able to manage cup with straw Grooming: Wash/dry hands;Wash/dry face;Supervision/safety;Bed level  General ADL Comments: decined eating but willing to take a drink of water. Patient was able to hold bottle and use straw    Extremity/Trunk Assessment Upper Extremity Assessment RUE Deficits /  Details: grasp 4/5 (unable to hold utensils or food to bring to mouth), decreased shoulder flexion, pain with all movement LUE Deficits / Details: grasp 4/5 (unable to hold utensils or food to bring to mouth), decreased shoulder flexion, pain with all movement            Vision       Perception     Praxis      Cognition Arousal/Alertness: Awake/alert Behavior During Therapy: Anxious Overall Cognitive Status: Impaired/Different from baseline Area of Impairment: Orientation, Attention, Memory, Following commands, Safety/judgement, Awareness, Problem solving                 Orientation Level: Disoriented to, Time Current Attention Level: Sustained Memory: Decreased recall of precautions, Decreased short-term memory Following Commands: Follows one step commands with increased time Safety/Judgement: Decreased awareness of safety, Decreased awareness of deficits Awareness: Intellectual Problem Solving: Slow processing, Decreased initiation, Requires verbal cues, Requires tactile cues          Exercises Exercises: General Upper Extremity General Exercises - Upper Extremity Shoulder Flexion: AAROM, Both, 10 reps    Shoulder Instructions       General Comments VSS on RA    Pertinent Vitals/ Pain       Pain Assessment Pain Assessment: Faces Faces Pain Scale: Hurts even more Pain Location: hips Pain Descriptors / Indicators: Grimacing Pain Intervention(s): Limited activity within patient's tolerance, Monitored during session  Home Living                                          Prior Functioning/Environment              Frequency  Min 2X/week        Progress Toward Goals  OT Goals(current goals can now be found in the care plan section)  Progress towards OT goals: Progressing toward goals  Acute Rehab OT Goals Patient Stated Goal: go home OT Goal Formulation: With patient Time For Goal Achievement: 01/21/22 Potential to Achieve  Goals: Fair ADL Goals Pt Will Perform Eating: with modified independence;with adaptive utensils;sitting Pt Will Perform Grooming: with set-up;with adaptive equipment;sitting Pt Will Perform Lower Body Bathing: with set-up;sitting/lateral leans;sit to/from stand;with adaptive equipment Pt Will Perform Upper Body Dressing: with min assist;sitting Pt Will Perform Lower Body Dressing: with mod assist;sitting/lateral leans;with adaptive equipment Pt Will Transfer to Toilet: with mod assist;stand pivot transfer;bedside commode Pt Will Perform Toileting - Clothing Manipulation and hygiene: with max assist;sit to/from stand Pt/caregiver will Perform Home Exercise Program: Right Upper extremity;Left upper extremity;With minimal assist;With written HEP provided;Increased ROM;Increased strength Additional ADL Goal #1: Pt will perform bed mobility and maintain sitting EOB at min A as precursor to participate in ADL  Plan Discharge plan remains appropriate;Discharge plan needs to be updated;Frequency needs to be updated    Co-evaluation    PT/OT/SLP Co-Evaluation/Treatment: Yes Reason for Co-Treatment: Complexity of the patient's impairments (multi-system involvement);Necessary to address cognition/behavior during functional activity;For patient/therapist safety PT goals addressed during session: Mobility/safety with mobility;Balance;Proper use of DME;Strengthening/ROM OT goals addressed during session: ADL's and self-care      AM-PAC OT "6 Clicks" Daily Activity     Outcome Measure   Help from another  person eating meals?: A Lot Help from another person taking care of personal grooming?: A Little Help from another person toileting, which includes using toliet, bedpan, or urinal?: Total Help from another person bathing (including washing, rinsing, drying)?: A Lot Help from another person to put on and taking off regular upper body clothing?: Total Help from another person to put on and taking off  regular lower body clothing?: Total 6 Click Score: 10    End of Session Equipment Utilized During Treatment: Rolling walker (2 wheels);Cervical collar  OT Visit Diagnosis: Unsteadiness on feet (R26.81);Other abnormalities of gait and mobility (R26.89);Muscle weakness (generalized) (M62.81);History of falling (Z91.81);Feeding difficulties (R63.3);Other symptoms and signs involving cognitive function;Adult, failure to thrive (R62.7);Pain Pain - Right/Left: Left Pain - part of body: Hip   Activity Tolerance Patient limited by pain   Patient Left in bed;with call bell/phone within reach;with bed alarm set;with restraints reapplied   Nurse Communication Mobility status        Time: 7902-4097 OT Time Calculation (min): 24 min  Charges: OT General Charges $OT Visit: 1 Visit OT Treatments $Self Care/Home Management : 8-22 mins  Lodema Hong, [place]  Pager [phone] Office [place] 01/20/2022, 12:55 PM

## 2022-01-20 NOTE — Progress Notes (Signed)
Physical Therapy Treatment Patient Details Name: RIVERS HAMRICK MRN: [id number] DOB: Dec 30, 1932 Today's Date: 01/20/2022   History of Present Illness Pt is an 88 year old male admitted 01/06/22 from SNF with AMS and slurred speech. Of note: recent admission 4/18-4/25 with C6 fx and pubic rami fx nonoperative mgmt. PMH includes: CAD, COPD, CVA, GERD, HTN, hx of CABG, PSA, a fib, hx of prostate cancer, DM, vertigo.    PT Comments    Pt remains limited by reports of hip and back pain. In sitting pt continues to demonstrate L lateral lean, appearing to attempt to offload R hip. Pt remains confused and require re-orientation to time. Pt will benefit from continued aggressive mobilization in an effort to improve transfer quality and reduce caregiver burden.   Recommendations for follow up therapy are one component of a multi-disciplinary discharge planning process, led by the attending physician.  Recommendations may be updated based on patient status, additional functional criteria and insurance authorization.  Follow Up Recommendations  Home health PT (family electing to D/C home)     Assistance Recommended at Discharge Frequent or constant Supervision/Assistance  Patient can return home with the following Two people to help with walking and/or transfers;Two people to help with bathing/dressing/bathroom;Assistance with cooking/housework;Assistance with feeding;Direct supervision/assist for medications management;Assist for transportation;Direct supervision/assist for financial management;Help with stairs or ramp for entrance   Equipment Recommendations  Wheelchair (measurements PT);Other (comment) (hoyer lift)    Recommendations for Other Services       Precautions / Restrictions Precautions Precautions: Fall;Cervical Precaution Booklet Issued: No Precaution Comments: watch BP Required Braces or Orthoses: Cervical Brace Cervical Brace: Soft collar;For comfort Restrictions Weight Bearing  Restrictions: Yes RLE Weight Bearing: Weight bearing as tolerated LLE Weight Bearing: Weight bearing as tolerated     Mobility  Bed Mobility Overal bed mobility: Needs Assistance Bed Mobility: Supine to Sit, Sit to Supine     Supine to sit: Max assist, HOB elevated Sit to supine: Max assist, HOB elevated        Transfers Overall transfer level: Needs assistance Equipment used: Rolling walker (2 wheels) Transfers: Sit to/from Stand Sit to Stand: Max assist, +2 physical assistance, From elevated surface           General transfer comment: attempted side steps to move toward head of bed however the pt is unable to step    Ambulation/Gait                   Stairs             Wheelchair Mobility    Modified Rankin (Stroke Patients Only)       Balance Overall balance assessment: Needs assistance Sitting-balance support: No upper extremity supported, Feet supported, Single extremity supported Sitting balance-Leahy Scale: Poor Sitting balance - Comments: min-modA, left lateral lean Postural control: Left lateral lean Standing balance support: Bilateral upper extremity supported, Reliant on assistive device for balance Standing balance-Leahy Scale: Poor Standing balance comment: maxA x2, left and posterior lean                            Cognition Arousal/Alertness: Awake/alert Behavior During Therapy: Anxious Overall Cognitive Status: Impaired/Different from baseline Area of Impairment: Orientation, Attention, Memory, Following commands, Safety/judgement, Awareness, Problem solving                 Orientation Level: Disoriented to, Time Current Attention Level: Sustained Memory: Decreased recall of precautions, Decreased short-term memory Following Commands:  Follows one step commands with increased time Safety/Judgement: Decreased awareness of safety, Decreased awareness of deficits Awareness: Intellectual Problem Solving: Slow  processing, Decreased initiation, Requires verbal cues, Requires tactile cues          Exercises General Exercises - Upper Extremity Shoulder Flexion: AAROM, Both, 10 reps General Exercises - Lower Extremity Ankle Circles/Pumps: AROM, Both, 10 reps Gluteal Sets: AROM, Both, 10 reps Short Arc Quad: AROM, Both, 10 reps Heel Slides: AAROM, Both, 10 reps Hip ABduction/ADduction: AAROM, Both, 10 reps    General Comments General comments (skin integrity, edema, etc.): VSS on RA      Pertinent Vitals/Pain Pain Assessment Pain Assessment: Faces Faces Pain Scale: Hurts even more Pain Location: hips Pain Descriptors / Indicators: Grimacing Pain Intervention(s): Monitored during session    Home Living                          Prior Function            PT Goals (current goals can now be found in the care plan section) Acute Rehab PT Goals Patient Stated Goal: to eventually return home PT Goal Formulation: With patient Time For Goal Achievement: 02/03/22 Potential to Achieve Goals: Fair Progress towards PT goals: Not progressing toward goals - comment (limited by pain and AMS)    Frequency    Min 3X/week      PT Plan Current plan remains appropriate    Co-evaluation PT/OT/SLP Co-Evaluation/Treatment: Yes Reason for Co-Treatment: Complexity of the patient's impairments (multi-system involvement);Necessary to address cognition/behavior during functional activity;For patient/therapist safety;To address functional/ADL transfers PT goals addressed during session: Mobility/safety with mobility;Balance;Proper use of DME;Strengthening/ROM        AM-PAC PT "6 Clicks" Mobility   Outcome Measure  Help needed turning from your back to your side while in a flat bed without using bedrails?: A Lot Help needed moving from lying on your back to sitting on the side of a flat bed without using bedrails?: A Lot Help needed moving to and from a bed to a chair (including a  wheelchair)?: Total Help needed standing up from a chair using your arms (e.g., wheelchair or bedside chair)?: Total Help needed to walk in hospital room?: Total Help needed climbing 3-5 steps with a railing? : Total 6 Click Score: 8    End of Session Equipment Utilized During Treatment: Cervical collar Activity Tolerance: Patient limited by pain Patient left: in bed;with call bell/phone within reach;with bed alarm set Nurse Communication: Mobility status;Need for lift equipment PT Visit Diagnosis: History of falling (Z91.81);Pain;Muscle weakness (generalized) (M62.81);Unsteadiness on feet (R26.81) Pain - Right/Left: Right Pain - part of body: Hip     Time: 6213-0865 PT Time Calculation (min) (ACUTE ONLY): 24 min  Charges:  $Therapeutic Activity: 8-22 mins                     Zenaida Niece, PT, DPT Acute Rehabilitation Pager: [phone] Office [phone]    Zenaida Niece 01/20/2022, 10:58 AM

## 2022-01-20 NOTE — Progress Notes (Signed)
Patient ID: STONY STEGMANN, male   DOB: 02/05/1933, 88 y.o.   MRN: [id number]    Progress Note from the Palliative Medicine Team at [facility]   Patient Name: [name]        Date: 01/20/2022 DOB: Oct 06, 1932  Age: 88 y.o. MRN#: [id number] Attending Physician: Mckinley Jewel, MD Primary Care Physician: [name], DO Admit Date: 01/06/2022   Medical records reviewed   88 y.o. male   admitted on 01/06/2022 with  past medical history significant of PAF on Eliquis, HFrEF (EF 35% by TTE 04/25/2020), CAD s/p CABG, Mobitz 2 second-degree AV block s/p PPM, COPD, T2DM, HTN, HLD, thrombocytopenia, recurrent UTIs, OSA not tolerating CPAP, AMS    He was going to be discharged home from the SNF after rehab but was altered and had slurred speech. He fell 3 weeks ago and has 3 fractures in his ramus and a cervical fracture. Admitted to trauma service at [facility] from 4/18-4/25. Discharged to SNF for rehab.   Today is day 14 of this patient's hospitalization.  Patient has continued to decline physically, functionally and cognitively within the context of full medical support.  He now is status post PEG placement.  He remains intermittently confused, and today appears generally uncomfortable. Family interpret his pain as abdominal pain and are concerned with planned discharge.      This NP visited patient at the bedside for continued palliative medicine support.   Patient's wife, daughter/son-in-law, grandson at bedside.    Again patient is confused , lethargic,  and appears generally uncomfortable.  Family reports to me that he is having "terrible abdominal pain".   I demonstrated to the family that patient is  generally uncomfortable and that he hollers out and grimaces no matter what part of his body I applied gentle pressure.  Education offered on concept specific to adult failure to thrive and the limitations of medical interventions to prologon quality of life   Education offered on patient's  current medical situation and is high risk for continued decompensation.  He remains high risk for aspiration, as patient is being offered p.o. intake along with PEG feeds.   Education offered on hospice benefit; philosophy and eligibility both in the home and residential and OP palliative community services  Plan of Care: -Full Code     - Educated family to consider DNR/DNI status understanding evidenced based poor outcomes in similar hospitalized patient, as the cause of arrest is likely associated with advanced chronic illness rather than an easily reversible acute cardio-pulmonary event.  -Family is open to all offered and available medical interventions to prolong life  -plan is to discharge home when stable  Education offered on concept specific to adult failure to thrive, this may be Mr. Bottom's new baseline  Education offered today regarding  the importance of continued conversation with the medical providers regarding overall plan of care and treatment options,  ensuring decisions are within the context of the patients values and GOCs.  Questions and concerns addressed   Discussed with treatment team and Dr Loann Quill   PMT will continue to support holistically    Wadie Lessen NP  Palliative Medicine Team Team Phone # [phone] Pager [phone]

## 2022-01-20 NOTE — Progress Notes (Signed)
SLP Cancellation Note  Patient Details Name: CARDELL [name] MRN: [id number] DOB: [birth year]   Cancelled treatment:       Reason Eval/Treat Not Completed: Fatigue/lethargy limiting ability to participate. Family at bedside.  Will continue efforts.   Juan Quam Laurice 01/20/2022, 3:01 PM

## 2022-01-20 NOTE — Progress Notes (Signed)
PROGRESS NOTE    [name]  WTU:[id number] DOB: 03-20-1933 DOA: 01/06/2022 PCP: [name], DO   Brief Narrative:  Patient is a 88 year old male with history of paroxysmal A-fib, chronic systolic congestive heart failure, coronary artery disease status post CABG, sick sinus syndrome status post permanent pacemaker placement, COPD, diabetes type 2, hypertension, hyperlipidemia, chronic thrombocytopenia, recurrent UTI, sleep apnea not on CPAP who presented with altered mental status from home.  He was recently admitted here with polytrauma in April and was found to have C6 spinal fracture, multiple pelvic ring fracture and was discharged to  SNF, was discharged with hard collar.  He was recently treated for UTI.  At the SNF, patient continued to have poor oral intake, noted to have garbled speech and confused.  On presentation, CT head did not show any acute findings.  Patient was admitted for further evaluation.  Neurosurgery was consulted, no further intervention planned.  Hard collar transition to soft collar.   PT/OT recommending home health on discharge.  Palliative care is also closely following.  Patient has had PEG tube placement 5/24 and is able to tolerate bolus feeds from this.  He continues to have intermittent confusion and is noted to have significant urinary retention.  Noted recurrence of UTI today and Rocephin initiated.  Urine culture resulted negative.  Rocephin discontinued on 5/27.  Assessment & Plan:  Acute metabolic encephalopathy: Improving -Multifactorial in the setting of history of recent trauma, Infection work-up has been negative.  -Chest x-ray did not show pneumonia.  CT head did not show any acute intracranial abnormalities.  MRI not ordered with low suspicion of stroke.  No neurological deficits.   -On delirium precautions.  Haldol as needed.  Dysphagia:  -Patient history of polytrauma with C6 fracture.   -Very poor oral intake.   -Status post PEG tube  has been placed with 5/24.  Appreciate dietitian input with bolus feeds initiated 5/25. -Patient continues to have problem with swallowing pills.  We will keep him n.p.o.  SLP consulted. Switched meds route from oral to PEG tube.  Urinary retention:  - Foley catheter placed on 5/26. -Likely will need to discharge with Foley catheter and outpatient urology follow-up. -Urine culture: No growth.  Discontinued antibiotics on 5/27   Normocytic anemia: Some of this is likely dilutional -H&H is currently stable.  No suspicion of overt bleeding.   Recent history of polytrauma:  -Found to have C6 fracture on last admission.  Currently on soft collar. -Neurosurgery recommended outpatient follow-up.   -Also found to have pelvic ring fracture with pelvic hematoma last admission.  -Continue pain management, supportive care.  PT/OT recommended skilled nursing facility on discharge-however patient's family would like to take him home with home health services.   Paroxysmal A-fib:  -Currently in normal sinus rhythm.  Eliquis was discontinued on last admission after polytrauma but has been resumed at low dose now.   Chronic systolic congestive heart failure: Euvolemic.   -Takes Lasix, Entresto, Aldactone, beta-blockers at home: These are on hold.  Blood pressure soft so we will continue holding for now.   -Last echocardiogram  as on 04/2020 showed EF of 35%.  No need for IV fluid at this time.   Diabetes type 2: Well controlled.  On metformin at home.  Last A1c 5.2% checked on 5/15.   -Currently on sliding scale insulin.  Monitor blood sugars   Hyperlipidemia: Takes crestor   History of depression: On Remeron, Effexor   Sick sinus syndrome:  Status post pacemaker placement   Thrombocytopenia: Chronic.  Stable   CKD stage IIIb: Baseline creatinine 1.4-1.6.  Kidney function stable  Oral Thrush:  on Nystatin   Diarrhea: Likely related to tube feeds  -Resolved now  Abdominal pain: Patient moaning in  pain.  Tender to touch on abdomen.  History of hernia.  Got stat abdominal x-ray showed no bowel obstruction.  Patient was given Tylenol and oxycodone and his symptoms improved.  Goals of care: Overall poor prognosis.  He is at increased risk for aspiration.  Palliative care closely following.  Remains full code. Palliative care met with family.  We recommend DNR-however family would like to keep him full code. PT/OT recommending home health-as this is what family would like to do.  DVT prophylaxis: Eliquis Code Status: Full code Family Communication:  None present at bedside.  Plan of care discussed with patient in length and he verbalized understanding and agreed with it.   Disposition Plan: Home with home health services  Consultants:  IR  Procedures:  PEG tube placement on 5/24   Status is: Inpatient     Subjective: Patient seen and examined.  Sleepy but arousable.  Denies any complaints.  No acute events overnight.  Patient was seen again this afternoon.  Family at the bedside.  Patient moaning in the pain.  Complaining of pain all over.  Abdominal tenderness positive with gentle touch.  Family not comfortable for the discharge today.  I examined patient again-family at the bedside.  He was given Tylenol and oxycodone for the pain and he was sleeping comfortably.  Family requested to change Tylenol from as needed to scheduled.  Objective: Vitals:   01/20/22 0320 01/20/22 0803 01/20/22 0905 01/20/22 1120  BP: (!) 111/51 (!) 107/53  (!) 112/57  Pulse: 88 88  86  Resp:  18  18  Temp: 99.2 F (37.3 C) 99.9 F (37.7 C)  98.4 F (36.9 C)  TempSrc: Oral Axillary  Axillary  SpO2: 96% 92% 93% 97%  Weight:        Intake/Output Summary (Last 24 hours) at 01/20/2022 1519 Last data filed at 01/20/2022 0941 Gross per 24 hour  Intake 0 ml  Output 1174 ml  Net -1174 ml    Filed Weights   01/16/22 0324 01/17/22 0500 01/19/22 0500  Weight: 76.6 kg 76 kg 78 kg     Examination:  General exam: Appears calm and comfortable, on room air, sleepy but arousable  Has soft neck collar  respiratory system: Clear to auscultation. Respiratory effort normal. Cardiovascular system: S1 & S2 heard, RRR. No JVD, murmurs, rubs, gallops or clicks. No pedal edema. Gastrointestinal system: Abdomen is nondistended, soft and nontender.  PEG tube site: No signs of active bleeding or discharge.  Abdominal hernia noted on lower abdomen  Central nervous system: Sleepy but arousable.  Answers questions appropriately.   Extremities: Moves all 4 extremities equally  skin: Multiple bruises noted on the arms.   Data Reviewed: I have personally reviewed following labs and imaging studies  CBC: Recent Labs  Lab 01/17/22 0550 01/18/22 0323 01/19/22 0226 01/20/22 0305  WBC 7.4 7.3 8.0 8.8  HGB 9.2* 9.4* 8.9* 8.4*  HCT 28.7* 29.5* 28.9* 26.6*  MCV 97.0 98.3 99.3 99.6  PLT 98* 92* 118* 109*    Basic Metabolic Panel: Recent Labs  Lab 01/14/22 0408 01/14/22 1648 01/15/22 0538 01/16/22 0443 01/17/22 0550 01/18/22 0323 01/19/22 0226  NA  --   --  137 135 137 134* 137  K  --   --  3.8 3.2* 4.5 4.4 5.0  CL  --   --  108 105 108 105 106  CO2  --   --  $R'24 25 23 22 27  'pr$ GLUCOSE  --   --  122* 125* 104* 110* 171*  BUN  --   --  $R'21 18 22 21 'NJ$ 24*  CREATININE  --   --  1.34* 1.22 1.16 1.12 1.12  CALCIUM  --   --  8.5* 8.7* 8.5* 8.4* 8.7*  MG 1.6* 2.1 2.0 1.9 1.9 1.9 2.0  PHOS 2.3* 2.5  --  3.1  --   --   --     GFR: Estimated Creatinine Clearance: 50.4 mL/min (by C-G formula based on SCr of 1.12 mg/dL). Liver Function Tests: No results for input(s): AST, ALT, ALKPHOS, BILITOT, PROT, ALBUMIN in the last 168 hours. No results for input(s): LIPASE, AMYLASE in the last 168 hours. No results for input(s): AMMONIA in the last 168 hours. Coagulation Profile: No results for input(s): INR, PROTIME in the last 168 hours. Cardiac Enzymes: No results for input(s): CKTOTAL,  CKMB, CKMBINDEX, TROPONINI in the last 168 hours. BNP (last 3 results) No results for input(s): PROBNP in the last 8760 hours. HbA1C: No results for input(s): HGBA1C in the last 72 hours. CBG: Recent Labs  Lab 01/19/22 1953 01/19/22 2318 01/20/22 0321 01/20/22 0801 01/20/22 1116  GLUCAP 170* 169* 151* 147* 153*    Lipid Profile: No results for input(s): CHOL, HDL, LDLCALC, TRIG, CHOLHDL, LDLDIRECT in the last 72 hours. Thyroid Function Tests: No results for input(s): TSH, T4TOTAL, FREET4, T3FREE, THYROIDAB in the last 72 hours. Anemia Panel: No results for input(s): VITAMINB12, FOLATE, FERRITIN, TIBC, IRON, RETICCTPCT in the last 72 hours.  Sepsis Labs: No results for input(s): PROCALCITON, LATICACIDVEN in the last 168 hours.  Recent Results (from the past 240 hour(s))  Urine Culture     Status: None   Collection Time: 01/17/22  8:54 AM   Specimen: Urine, Catheterized  Result Value Ref Range Status   Specimen Description URINE, CATHETERIZED  Final   Special Requests NONE  Final   Culture   Final    NO GROWTH Performed at [facility], [address] [place], [place] [address]    Report Status 01/18/2022 FINAL  Final       Radiology Studies: DG Abd 1 View  Result Date: 01/20/2022 CLINICAL DATA:  Abdominal pain. EXAM: ABDOMEN - 1 VIEW COMPARISON:  01/17/2022 and prior studies.  CT, 12/11/2021. FINDINGS: Mild distension of the colon, similar to the prior exam. No small bowel dilation. Stable gastrostomy tube in the left central upper abdomen. Scattered stable surgical vascular clips in the central abdomen. Stable vascular calcifications. IMPRESSION: 1. No acute findings. 2. Mild distension of the colon similar to the most recent prior exam, but improved from 01/15/2022. No evidence of bowel obstruction. Electronically Signed   By: Lajean Manes M.D.   On: 01/20/2022 14:07    Scheduled Meds:  amiodarone  200 mg Per Tube Daily   apixaban  5 mg Per Tube BID    azelastine  1 spray Each Nare BID   Chlorhexidine Gluconate Cloth  6 each Topical Daily   feeding supplement  237 mL Oral TID BM   feeding supplement (OSMOLITE 1.5 CAL)  355 mL Per Tube QID   insulin aspart  0-9 Units Subcutaneous TID WC   mirtazapine  7.5 mg Per Tube QHS   mometasone-formoterol  2 puff Inhalation BID   nystatin  5 mL Oral QID   pantoprazole sodium  40 mg Per Tube Daily   rosuvastatin  40 mg Per Tube QHS   [START ON 01/21/2022] venlafaxine  18.75 mg Per Tube BID WC   Continuous Infusions:   LOS: 10 days   Time spent: 45 minutes   [name] Loann Quill, MD [facility]  If 7PM-7AM, please contact night-coverage [email or web address] 01/20/2022, 3:19 PM

## 2022-01-21 ENCOUNTER — Other Ambulatory Visit (HOSPITAL_COMMUNITY): Payer: Self-pay

## 2022-01-21 DIAGNOSIS — G9340 Encephalopathy, unspecified: Secondary | ICD-10-CM | POA: Diagnosis not present

## 2022-01-21 LAB — CBC WITH DIFFERENTIAL/PLATELET
Abs Immature Granulocytes: 0.09 10*3/uL — ABNORMAL HIGH (ref 0.00–0.07)
Basophils Absolute: 0 10*3/uL (ref 0.0–0.1)
Basophils Relative: 0 %
Eosinophils Absolute: 0.2 10*3/uL (ref 0.0–0.5)
Eosinophils Relative: 3 %
HCT: 26.7 % — ABNORMAL LOW (ref 39.0–52.0)
Hemoglobin: 8.7 g/dL — ABNORMAL LOW (ref 13.0–17.0)
Immature Granulocytes: 1 %
Lymphocytes Relative: 14 %
Lymphs Abs: 1.2 10*3/uL (ref 0.7–4.0)
MCH: 32 pg (ref 26.0–34.0)
MCHC: 32.6 g/dL (ref 30.0–36.0)
MCV: 98.2 fL (ref 80.0–100.0)
Monocytes Absolute: 0.8 10*3/uL (ref 0.1–1.0)
Monocytes Relative: 10 %
Neutro Abs: 6.2 10*3/uL (ref 1.7–7.7)
Neutrophils Relative %: 72 %
Platelets: 107 10*3/uL — ABNORMAL LOW (ref 150–400)
RBC: 2.72 MIL/uL — ABNORMAL LOW (ref 4.22–5.81)
RDW: 19.4 % — ABNORMAL HIGH (ref 11.5–15.5)
WBC: 8.5 10*3/uL (ref 4.0–10.5)
nRBC: 0 % (ref 0.0–0.2)

## 2022-01-21 LAB — BASIC METABOLIC PANEL
Anion gap: 6 (ref 5–15)
BUN: 26 mg/dL — ABNORMAL HIGH (ref 8–23)
CO2: 24 mmol/L (ref 22–32)
Calcium: 9.1 mg/dL (ref 8.9–10.3)
Chloride: 103 mmol/L (ref 98–111)
Creatinine, Ser: 1 mg/dL (ref 0.61–1.24)
GFR, Estimated: >60 mL/min (ref >60)
Glucose, Bld: 165 mg/dL — ABNORMAL HIGH (ref 70–99)
Potassium: 4.9 mmol/L (ref 3.5–5.1)
Sodium: 133 mmol/L — ABNORMAL LOW (ref 135–145)

## 2022-01-21 LAB — GLUCOSE, CAPILLARY
Glucose-Capillary: 162 mg/dL — ABNORMAL HIGH (ref 70–99)
Glucose-Capillary: 173 mg/dL — ABNORMAL HIGH (ref 70–99)
Glucose-Capillary: 167 mg/dL — ABNORMAL HIGH (ref 70–99)
Glucose-Capillary: 178 mg/dL — ABNORMAL HIGH (ref 70–99)

## 2022-01-21 LAB — MAGNESIUM: Magnesium: 1.9 mg/dL (ref 1.7–2.4)

## 2022-01-21 MED ORDER — ONDANSETRON 8 MG PO TBDP
8.0000 mg | ORAL_TABLET | Freq: Three times a day (TID) | ORAL | 0 refills | Status: DC | PRN
Start: 2022-01-21 — End: 2022-03-03

## 2022-01-21 MED ORDER — ROSUVASTATIN CALCIUM 40 MG PO TABS: 40.0000 mg | ORAL_TABLET | Freq: Every day | ORAL | Status: DC

## 2022-01-21 MED ORDER — DIPHENOXYLATE-ATROPINE 2.5-0.025 MG PO TABS: 1.0000 | ORAL_TABLET | Freq: Four times a day (QID) | ORAL | 1 refills | Status: DC | PRN

## 2022-01-21 MED ORDER — ALUM & MAG HYDROXIDE-SIMETH 200-200-20 MG/5ML PO SUSP: 30.0000 mL | ORAL | 0 refills | Status: DC | PRN

## 2022-01-21 MED ORDER — SENNOSIDES-DOCUSATE SODIUM 8.6-50 MG PO TABS
1.0000 | ORAL_TABLET | Freq: Every evening | ORAL | 0 refills | Status: DC | PRN
  Filled 2022-01-21: qty 30, 30d supply, fill #0

## 2022-01-21 MED ORDER — NYSTATIN 100000 UNIT/ML MT SUSP
5.0000 mL | Freq: Four times a day (QID) | OROMUCOSAL | 0 refills | Status: DC
  Filled 2022-01-21: qty 60, 3d supply, fill #0

## 2022-01-21 MED ORDER — ENSURE ENLIVE PO LIQD
237.0000 mL | Freq: Three times a day (TID) | ORAL | 12 refills | Status: DC
  Filled 2022-01-21: qty 237, 1d supply, fill #0

## 2022-01-21 MED ORDER — FAMOTIDINE 20 MG PO TABS: 20.0000 mg | ORAL_TABLET | Freq: Two times a day (BID) | ORAL | 1 refills | Status: DC

## 2022-01-21 MED ORDER — OSMOLITE 1.5 CAL PO LIQD
355.0000 mL | Freq: Four times a day (QID) | ORAL | 3 refills | Status: DC
Start: 2022-01-21 — End: 2022-02-19
  Filled 2022-01-21: qty 1000, 1d supply, fill #0

## 2022-01-21 MED ORDER — APIXABAN 5 MG PO TABS: 5.0000 mg | ORAL_TABLET | Freq: Two times a day (BID) | ORAL | Status: DC

## 2022-01-21 MED ORDER — VENLAFAXINE HCL 37.5 MG PO TABS
18.2500 mg | ORAL_TABLET | Freq: Two times a day (BID) | ORAL | 2 refills | Status: DC
Start: 2022-01-21 — End: 2022-03-05
  Filled 2022-01-21: qty 30, 30d supply, fill #0

## 2022-01-21 MED ORDER — MIRTAZAPINE 7.5 MG PO TABS: 7.5000 mg | ORAL_TABLET | Freq: Every day | ORAL | Status: DC

## 2022-01-21 MED ORDER — OXYCODONE HCL 5 MG PO TABS: 5.0000 mg | ORAL_TABLET | ORAL | 0 refills | Status: DC | PRN

## 2022-01-21 MED ORDER — ACETAMINOPHEN 160 MG/5ML PO SOLN: 1000.0000 mg | Freq: Four times a day (QID) | ORAL | 0 refills | Status: DC

## 2022-01-21 MED ORDER — AMIODARONE HCL 200 MG PO TABS
200.0000 mg | ORAL_TABLET | Freq: Every day | ORAL | 1 refills | Status: DC
Start: 2022-01-22 — End: 2022-07-21

## 2022-01-21 MED ORDER — PANTOPRAZOLE SODIUM 40 MG PO PACK
40.0000 mg | PACK | Freq: Every day | ORAL | 1 refills | Status: DC
  Filled 2022-01-21: qty 30, 30d supply, fill #0

## 2022-01-21 NOTE — Progress Notes (Addendum)
Daughter verbalized understanding of the AVS d/c instructions. All questions answered. Awaiting transport for d/c to home with daughter.

## 2022-01-21 NOTE — Plan of Care (Signed)

## 2022-01-21 NOTE — Progress Notes (Signed)
Speech Language Pathology Treatment: Dysphagia  Patient Details Name: [name] MRN: [id number] DOB: February 08, 1933 Today's Date: 01/21/2022 Time: 1120-1129 SLP Time Calculation (min) (ACUTE ONLY): 9 min  Assessment / Plan / Recommendation Clinical Impression  [name] was sleepy this am, but appeared to be more comfortable and did not call out in pain when his hand or arm was touched. He accepted several sips of water with good oral control, no s/s of aspiration, exchanged pleasantries with this clinician, then promptly fell back asleep.  His swallow is functional, and despite presence of PEG, recommend that staff/family continue to offer him POs that he would enjoy.  Swallowing abilities will likely wax and wane related to his overall mental status.  No further SLP f/u is warranted.  Continue dysphagia 2 diet, thin liquids, and meds via PEG given recent difficulty swallowing pills, specifically.  Our service will respectfully sign off.    HPI HPI: [name] is a 88 y.o. male who presented with AMS and slurred speech.  CXR and Head CT 5/15 negative for acute findings.  Pt recently admitted for fall 4/18-4/25 with C5 and ramus fractures and d/c'd to SNF.  He has had poor PO intake since being at the SNF. On admission, he was alert, but not oriented which is not his baseline. Baseline A&O x 4. Pt recently treated for UTI.  PMH: PAF on Eliquis, HFrEF (EF 35% by TTE 04/25/2020), CAD s/p CABG, Mobitz 2 second-degree AV block s/p PPM, COPD, T2DM, HTN, HLD, thrombocytopenia, recurrent UTIs, OSA not tolerating CPAP. SLP evaluation 12/15/21: articulatory imprecision with reduced speech intelligibility, cognitive impairments noted in temporal orientation, memory, awareness, attention, and executive function; ST f/u for cog/dysphagia tx.      SLP Plan  Discharge SLP treatment due to (comment)- not making progress      Recommendations for follow up therapy are one component of a multi-disciplinary  discharge planning process, led by the attending physician.  Recommendations may be updated based on patient status, additional functional criteria and insurance authorization.    Recommendations  Diet recommendations: Dysphagia 2 (fine chop);Thin liquid Liquids provided via: Cup;Straw Medication Administration: Via alternative means Supervision: Staff to assist with self feeding Compensations: Slow rate;Small sips/bites                Oral Care Recommendations: Oral care BID Follow Up Recommendations: No SLP follow up Assistance recommended at discharge: Frequent or constant Supervision/Assistance SLP Visit Diagnosis: Dysphagia, unspecified (R13.10) Plan: Discharge SLP treatment due to (comment)         Labrian Torregrossa L. Tivis Ringer, [place] CCC/SLP Acute Rehabilitation Services Office number [phone] Pager [phone]   [name]  01/21/2022, 11:30 AM

## 2022-01-21 NOTE — TOC Initial Note (Deleted)
Transition of Care [facility]) - Initial/Assessment Note    Patient Details  Name: [name] MRN: [id number] Date of Birth: [birth year]  [facility]) CM/SW Contact:    Angelita Ingles, RN Phone Number:[phone]  01/21/2022, 12:07 PM  Clinical Narrative:                   Expected Discharge Plan: [place] Barriers to Discharge: Continued Medical Work up   Patient Goals and CMS Choice Patient states their goals for this hospitalization and ongoing recovery are:: Pt is disoriented and unable to participate in goal setting. CMS Medicare.gov Compare Post Acute Care list provided to:: Patient Represenative (must comment) [name]) Choice offered to / list presented to : [facility] POA / Guardian, Adult Children  Expected Discharge Plan and Services Expected Discharge Plan: [place] Choice: [place] arrangements for the past 2 months: Single Family Home Expected Discharge Date: 01/21/22                                    Prior Living Arrangements/Services Living arrangements for the past 2 months: Single Family Home Lives with:: Self, Adult Children Patient language and need for interpreter reviewed:: Yes Do you feel safe going back to the place where you live?: Yes      Need for Family Participation in Patient Care: Yes (Comment) Care giver support system in place?: Yes (comment) Current home services: DME Criminal Activity/Legal Involvement Pertinent to Current Situation/Hospitalization: No - Comment as needed  Activities of Daily Living      Permission Sought/Granted Permission sought to share information with : Family Supports Permission granted to share information with : Yes, Verbal Permission Granted  Share Information with NAME: Neil Crouch     Permission granted to share info w Relationship: Daughter HCPOA  Permission granted to share info w Contact Information:  [phone]  Emotional Assessment Appearance:: Appears stated age Attitude/Demeanor/Rapport: Unable to Assess Affect (typically observed): Unable to Assess Orientation: : Oriented to Self Alcohol / Substance Use: Not Applicable Psych Involvement: No (comment)  Admission diagnosis:  Dysarthria [R47.1] Acute encephalopathy [G93.40] AKI (acute kidney injury) ([place]) [N17.9] Patient Active Problem List   Diagnosis Date Noted   Protein-calorie malnutrition, severe 01/07/2022   Acute encephalopathy 01/06/2022   Acute kidney injury superimposed on chronic kidney disease ([place]) 01/06/2022   C6 cervical fracture ([place]) 01/06/2022   History of pelvic hematoma 01/06/2022   Dysarthria 01/06/2022   Closed fracture of multiple pubic rami, right, sequela 54/00/8676   Complicated UTI (urinary tract infection) 06/23/2021   HFrEF (heart failure with reduced ejection fraction) ([place]) 06/23/2021   AKI (acute kidney injury) ([place]) 06/23/2021   COVID-19 virus RNA test result positive at limit of detection 02/01/2021   Kidney stones 07/04/2020   Hyperlipidemia associated with type 2 diabetes mellitus ([place]) 07/05/2019   Paroxysmal atrial fibrillation ([place]) 06/29/2019   Acquired thrombophilia ([place]) 06/29/2019   Second degree Mobitz II AV block 02/22/2019   B12 deficiency 12/18/2017   Status post right knee replacement 11/10/2016   S/P repair of abdominal aortic aneurysm using bifurcation graft 10/03/2016   Mixed incontinence 04/03/2015   PVC's (premature ventricular contractions) 06/05/2014   BMI 29.0-29.9,adult 19/50/9326   Diastolic dysfunction- grade 1 by echo June 2015, EF 50% 03/28/2014   COPD (chronic obstructive pulmonary disease) ([place]) 02/21/2014  OSA (obstructive sleep apnea) 12/07/2013   Sinus bradycardia- ? symptomatic 05/17/2013   Coronary artery disease involving nonautologous biological coronary bypass graft without angina pectoris    Thrombocytopenia (HCC)    S/P CABG x 3 08/21/2011    Hypertensive cardiovascular disease    Prostate cancer ([place])    Hyperlipidemia with target LDL less than 100    Controlled type 2 diabetes mellitus without complication, without long-term current use of insulin ([name]) 11/28/2010   PCP:  [name], DO Pharmacy:   [id number] [address]Phone: [phone]: [phone] [facility] - [address] [place] [address]Phone: [phone]: [phone] [facility] [place] [place] [place]- [address]Phone: [phone]: [address] [place]Phone: [phone]: [phone]    Social Determinants of Health (SDOH) Interventions    Readmission Risk Interventions    01/16/2022    2:12 PM  Readmission Risk Prevention Plan  Transportation Screening Complete  Medication Review (RN Care Manager) Referral to Pharmacy  PCP or Specialist appointment within 3-5 days of discharge Complete  [place] or [place]  SW Recovery Care/Counseling Consult Complete  [place] Applicable

## 2022-01-21 NOTE — TOC Progression Note (Signed)
Transition of Care [facility]) - Progression Note    Patient Details  Name: [name] MRN: [id number] Date of Birth: [birth year]  Transition of Care [facility]) CM/SW [place], RN Phone Number:[phone]  01/21/2022, 12:17 PM  Clinical Narrative:    CM called order for home tube feeds to Lucrecia with [facility]. Per [employer] they can not tell me exactly when the tube feeds will arrive because the information has to be emailed to the team and the team will reach out to the family. Leda Gauze states that she will make sure the team is aware that patient will be discharging today.    Expected Discharge Plan: [place] Barriers to Discharge: Continued Medical Work up  Expected Discharge Plan and Services Expected Discharge Plan: [place] Choice: [place] arrangements for the past 2 months: Single Family Home Expected Discharge Date: 01/21/22                                     Social Determinants of Health (SDOH) Interventions    Readmission Risk Interventions    01/16/2022    2:12 PM  Readmission Risk Prevention Plan  Transportation Screening Complete  Medication Review (RN Care Manager) Referral to Pharmacy  PCP or Specialist appointment within 3-5 days of discharge Complete  HRI or [place] Complete  SW Recovery Care/Counseling Consult Complete  [place] Not Applicable

## 2022-01-21 NOTE — TOC Transition Note (Signed)
Transition of Care [facility]) - CM/SW Discharge Note   Patient Details  Name: JEOVANNI HEURING MRN: [id number] Date of Birth: [birth year]  Transition [facility]) CM/SW Contact:  Angelita Ingles, RN Phone Number:[phone]  01/21/2022, 2:04 PM   Clinical Narrative:    Patient to discharge home. Daughter is ready for transport to be called. PTAR called transportation has been set up. D/c packet at nurses station. No other needs noted at this time. TOC will sign off.     Barriers to Discharge: Continued Medical Work up   Patient Goals and CMS Choice Patient states their goals for this hospitalization and ongoing recovery are:: Pt is disoriented and unable to participate in goal setting. CMS Medicare.gov Compare Post Acute Care list provided to:: Patient Represenative (must comment) [name]) Choice offered to / list presented to : [place] / [place], Adult Children  Discharge Placement                       Discharge Plan and Services     Post Acute Care Choice: Home Health                               Social Determinants of Health (SDOH) Interventions     Readmission Risk Interventions    01/16/2022    2:12 PM  Readmission Risk Prevention Plan  Transportation Screening Complete  Medication Review (RN Care Manager) Referral to Pharmacy  PCP or Specialist appointment within 3-5 days of discharge Complete  HRI or [place] Complete  SW Recovery Care/Counseling Consult Complete  Palliative Care Screening Complete  [facility] Not Applicable

## 2022-01-21 NOTE — Progress Notes (Signed)
Pt was d/c home via PTAR. Daughter with patient.Medications and packet sent with pt.

## 2022-01-21 NOTE — TOC Benefit Eligibility Note (Signed)
Patient Advocate Encounter   Received notification that prior authorization for Pantoprazole Sodium powder is required.   PA submitted on 01/21/2022 Key [id number] Status is pending       Lyndel Safe, [place] Patient Advocate Specialist [place] Patient Advocate Team Direct Number: [phone]  Fax: [phone]

## 2022-01-21 NOTE — Progress Notes (Signed)
Orthopedic Tech Progress Note Patient Details:  [name] 10/02/1932 [id number]  Secretary called requesting an ABDOMINAL BINDER and a SOFT COLLAR for patient. Helped RN apply Ortho Devices Type of Ortho Device: Abdominal binder, Soft collar Ortho Device/Splint Location: NECK, STOMACH Ortho Device/Splint Interventions: Ordered, Application, Adjustment   Post Interventions Patient Tolerated: Well, Fair Instructions Provided: Care of device  Janit Pagan 01/21/2022, 1:32 PM

## 2022-01-21 NOTE — TOC Benefit Eligibility Note (Signed)
Patient Advocate Encounter   Received notificationthat prior authorization for Pantoprazole Sodium powder is required.   PA submitted on 01/21/2022 Key [id number] Status is pending       Lyndel Safe, [place] Patient Advocate Specialist [place] Patient Advocate Team Direct Number: [phone]  Fax: [phone]

## 2022-01-21 NOTE — Discharge Summary (Signed)
Physician Discharge Summary   Patient: [name] MRN: [id number] DOB: 09/10/1932  Admit date:     01/06/2022  Discharge date: 01/21/22  Discharge Physician:  A    PCP: Gottschalk, Ashly M, DO   Recommendations at discharge:    Follow up with Neurosurgery in clinic Continue soft cervical collar Follow up with Primary Care within 1-2 weeks Repeat CBC, BMP, Mg, Phos in 1-2 weeks Follow up with urology. Discharged with Foley in place for urinary retention.   Discharge Diagnoses: Principal Problem:   Acute encephalopathy Active Problems:   Dysarthria   Acute kidney injury superimposed on chronic kidney disease (HCC)   C6 cervical fracture (HCC)   History of pelvic hematoma   Paroxysmal atrial fibrillation (HCC)   Closed fracture of multiple pubic rami, right, sequela   HFrEF (heart failure with reduced ejection fraction) (HCC)   Controlled type 2 diabetes mellitus without complication, without long-term current use of insulin (HCC)   COPD (chronic obstructive pulmonary disease) (HCC)   S/P CABG x 3   Second degree Mobitz II AV block   Hyperlipidemia with target LDL less than 100   OSA (obstructive sleep apnea)   Thrombocytopenia (HCC)   Protein-calorie malnutrition, severe  Resolved Problems:   * No resolved hospital problems. *  Hospital Course: Patient is a 88-year-old male with history of paroxysmal A-fib, chronic systolic congestive heart failure, coronary artery disease status post CABG, sick sinus syndrome status post permanent pacemaker placement, COPD, diabetes type 2, hypertension, hyperlipidemia, chronic thrombocytopenia, recurrent UTI, sleep apnea not on CPAP who presented with altered mental status from home.  He was recently admitted here with polytrauma in April and was found to have C6 spinal fracture, multiple pelvic ring fracture and was discharged to  SNF, was discharged with hard collar.  He was recently treated for UTI.  At the SNF, patient  continued to have poor oral intake, noted to have garbled speech and confused.  On presentation, CT head did not show any acute findings.  Patient was admitted for further evaluation.  Neurosurgery was consulted, no further intervention planned.  Hard collar transition to soft collar.   PT/OT recommending home health on discharge.  Palliative care is also closely following.  Patient has had PEG tube placement 5/24 and is able to tolerate bolus feeds from this.  He continues to have intermittent confusion and is noted to have significant urinary retention.  Noted recurrence of UTI today and Rocephin initiated.  Urine culture resulted negative.  Rocephin discontinued on 5/27.  5/30: patient is clinically improved and medically stable for d/c home with home health services today.  Family declined SNF placement as recommended by therapy.  Assessment and Plan: Acute metabolic encephalopathy: Improving -Multifactorial in the setting of history of recent trauma, Infection work-up has been negative.  -Chest x-ray did not show pneumonia.   CT head did not show any acute intracranial abnormalities. MRI not ordered with low suspicion of stroke.   No focal neurological deficits.   -On delirium precautions.   -Haldol as needed.   Dysphagia:  -Patient history of polytrauma with C6 fracture.   -Very poor oral intake.   -Status post PEG tube has been placed with 5/24.   -Dietitian input appreciated -On bolus tube feeds initiated 5/25. -Patient continues to have problem with swallowing pills.  We will keep him n.p.o.  SLP consulted. Switched meds route from oral to PEG tube.   Urinary retention:  - Foley catheter placed on 5/26. -Discharge   with Foley catheter and outpatient urology follow-up. -Urine culture: No growth.   Discontinued antibiotics on 5/27   Normocytic anemia: Some of this is likely dilutional -H&H is currently stable.  No suspicion of overt bleeding.   Recent history of polytrauma:   -Found to have C6 fracture on last admission.   Currently in soft collar.  -Neurosurgery recommended outpatient follow-up.   -Also found to have pelvic ring fracture with pelvic hematoma last admission.   -Continue pain management, supportive care.   PT/OT recommended SNF on discharge-however patient's family would like to take him home with home health services.   Paroxysmal A-fib:  -Currently in normal sinus rhythm.  Eliquis was discontinued on last admission after polytrauma but has been resumed at low dose now.   Chronic systolic congestive heart failure: Euvolemic.   -Takes Lasix, Entresto, Aldactone, beta-blockers at home: These are on hold.  Blood pressure soft so we will continue holding for now.   -Last echocardiogram  as on 04/2020 showed EF of 35%.  No need for IV fluid at this time.   Diabetes type 2: Well controlled.  On metformin at home.  Last A1c 5.2% checked on 5/15.   -Currently on sliding scale insulin.  Monitor blood sugars   Hyperlipidemia: Takes crestor   History of depression: On Remeron, Effexor   Sick sinus syndrome: Status post pacemaker placement   Thrombocytopenia: Chronic.  Stable   CKD stage IIIb: Baseline creatinine 1.4-1.6.  Kidney function stable   Oral Thrush:  on Nystatin   Diarrhea: Likely related to tube feeds  -Resolved now -Lomotil PRN   Abdominal pain: Patient moaning in pain.  Tender to touch on abdomen.  History of hernia.  Got stat abdominal x-ray showed no bowel obstruction.  Patient was given Tylenol and oxycodone and his symptoms improved.   Goals of care: Overall poor prognosis.  He is at increased risk for aspiration.  Palliative care closely following.  Remains full code. Palliative care met with family.  We recommend DNR-however family would like to keep him full code. PT/OT recommending home health-as this is what family would like to do.   DVT prophylaxis: Eliquis Code Status: Full code Family Communication:  None present at  bedside.  Plan of care discussed with patient in length and he verbalized understanding and agreed with it.     Disposition Plan: Home with home health services   Consultants:  IR    Procedures:  PEG tube placement on 5/24        Disposition: Home health Diet recommendation:  Discharge Diet Orders (From admission, onward)     Start     Ordered   01/21/22 0000  Diet - low sodium heart healthy        01/21/22 1132           NPO PEG tube feeds and meds per tube DISCHARGE MEDICATION: Allergies as of 01/21/2022       Reactions   Penicillins Other (See Comments)   Unknown   Tramadol Other (See Comments)   Dizzy   Ketorolac Tromethamine Rash        Medication List     STOP taking these medications    acetaminophen 500 MG tablet Commonly known as: TYLENOL Replaced by: acetaminophen 160 MG/5ML solution   desvenlafaxine 25 MG 24 hr tablet Commonly known as: PRISTIQ   furosemide 20 MG tablet Commonly known as: LASIX   metoprolol succinate 25 MG 24 hr tablet Commonly known as: TOPROL-XL     omeprazole 20 MG capsule Commonly known as: PRILOSEC   sacubitril-valsartan 24-26 MG Commonly known as: ENTRESTO   spironolactone 25 MG tablet Commonly known as: ALDACTONE       TAKE these medications    acetaminophen 160 MG/5ML solution Commonly known as: TYLENOL Place 31.3 mLs (1,000 mg total) into feeding tube every 6 (six) hours. Replaces: acetaminophen 500 MG tablet   albuterol 108 (90 Base) MCG/ACT inhaler Commonly known as: VENTOLIN HFA Inhale 2 puffs into the lungs every 6 (six) hours as needed for wheezing or shortness of breath.   alum & mag hydroxide-simeth 200-200-20 MG/5ML suspension Commonly known as: MAALOX/MYLANTA Place 30 mLs into feeding tube every 4 (four) hours as needed for indigestion, heartburn or flatulence.   amiodarone 200 MG tablet Commonly known as: PACERONE Place 1 tablet (200 mg total) into feeding tube daily. Start taking  on: Jan 22, 2022 What changed: how to take this   apixaban 5 MG Tabs tablet Commonly known as: Eliquis Place 1 tablet (5 mg total) into feeding tube 2 (two) times daily. What changed:  how to take this additional instructions   azelastine 0.1 % nasal spray Commonly known as: ASTELIN Place 1 spray into both nostrils 2 (two) times daily.   bisacodyl 10 MG suppository Commonly known as: DULCOLAX Place 1 suppository (10 mg total) rectally daily as needed for moderate constipation.   budesonide-formoterol 160-4.5 MCG/ACT inhaler Commonly known as: SYMBICORT Inhale 2 puffs into the lungs 2 (two) times daily.   diphenoxylate-atropine 2.5-0.025 MG tablet Commonly known as: Lomotil Place 1 tablet into feeding tube 4 (four) times daily as needed for diarrhea or loose stools.   docusate sodium 100 MG capsule Commonly known as: COLACE Take 1 capsule (100 mg total) by mouth 2 (two) times daily.   famotidine 20 MG tablet Commonly known as: PEPCID Place 1 tablet (20 mg total) into feeding tube 2 (two) times daily.   feeding supplement Liqd Take 237 mLs by mouth 3 (three) times daily between meals.   feeding supplement (OSMOLITE 1.5 CAL) Liqd Place 355 mLs into feeding tube 4 (four) times daily.   fluticasone 50 MCG/ACT nasal spray Commonly known as: FLONASE Place 2 sprays into both nostrils daily as needed for allergies or rhinitis.   lidocaine 5 % Commonly known as: LIDODERM Place 1 patch onto the skin daily. Remove & Discard patch within 12 hours or as directed by MD   meclizine 25 MG tablet Commonly known as: ANTIVERT Take 1 tablet (25 mg total) by mouth 3 (three) times daily as needed for dizziness.   metFORMIN 500 MG tablet Commonly known as: GLUCOPHAGE Take 1 tablet (500 mg total) by mouth daily with breakfast.   mirtazapine 7.5 MG tablet Commonly known as: REMERON Place 1 tablet (7.5 mg total) into feeding tube at bedtime. What changed: how to take this   nystatin  100000 UNIT/ML suspension Commonly known as: MYCOSTATIN Take 5 mLs (500,000 Units total) by mouth 4 (four) times daily.   ondansetron 8 MG disintegrating tablet Commonly known as: ZOFRAN-ODT Take 1 tablet (8 mg total) by mouth every 8 (eight) hours as needed for nausea or vomiting.   onetouch ultrasoft lancets Use to check blood sugars daily   OneTouch Verio test strip Generic drug: glucose blood TEST BLOOD SUGARS DAILY DX E11.9   oxyCODONE 5 MG immediate release tablet Commonly known as: Oxy IR/ROXICODONE Place 1 tablet (5 mg total) into feeding tube every 4 (four) hours as needed for severe pain. What   changed:  how to take this when to take this   rosuvastatin 40 MG tablet Commonly known as: Crestor Place 1 tablet (40 mg total) into feeding tube at bedtime. What changed:  how to take this when to take this additional instructions   senna-docusate 8.6-50 MG tablet Commonly known as: Senokot-S Place 1 tablet into feeding tube at bedtime as needed for mild constipation.   venlafaxine 37.5 MG tablet Commonly known as: EFFEXOR Place 0.5 tablets (18.75 mg total) into feeding tube 2 (two) times daily with a meal.        Follow-up Information     Gottschalk, Ashly M, DO .   Specialty: Family Medicine Contact information: [address] [place] [address] [phone]         Branch, Jonathan F, MD .   Specialty: Cardiology Contact information: [address] [place] [address] [phone]         Allred, James, MD .   Specialty: Cardiology Contact information: [address] [place] [address] [phone]                Discharge Exam: Filed Weights   01/17/22 0500 01/19/22 0500 01/21/22 0500  Weight: 76 kg 78 kg 82.6 kg   General exam: awake, alert, no acute distress HEENT: in soft cervical collar, moist mucus membranes, hearing grossly normal  Respiratory system: CTAB, no wheezes, rales or rhonchi, normal  respiratory effort. Cardiovascular system: normal S1/S2, RRR, no JVD, murmurs, rubs, gallops, no pedal edema.   Gastrointestinal system: PEG tube in place, soft, NT, ND, no HSM felt, +bowel sounds. Central nervous system: Dysarthric speech otherwise no gross focal neurologic deficits, normal speech Extremities: moves all, no edema, normal tone Skin: dry, intact, normal temperature Psychiatry: normal mood, congruent affect, judgement and insight appear normal   Condition at discharge: stable  The results of significant diagnostics from this hospitalization (including imaging, microbiology, ancillary and laboratory) are listed below for reference.   Imaging Studies: DG Cervical Spine With Flex & Extend  Result Date: 01/07/2022 CLINICAL DATA:  Provided history: Cervical vertebral closed fracture, recent. Altered mental status. Neck pain. EXAM: CERVICAL SPINE COMPLETE WITH FLEXION AND EXTENSION VIEWS COMPARISON:  Cervical spine CT 12/10/2021. FINDINGS: Known subacute fracture of the C6 vertebral body, extending through a C6-C7 ventral osteophyte, better appreciated on the prior cervical spine CT of 12/10/2021. Grade 1 anterolisthesis at C2-C3 which increases with flexion and resolves with flexion. Grade 1 anterolisthesis at C3-C4, which increases with flexion and improves with extension. Grade 1 anterolisthesis at C4-C5, which increases with a flexion and improves with extension. Grade 1 anterolisthesis at C5-C6, which does not significantly change with flexion or extension. No significant spondylolisthesis at C6-C7. Cervical spondylosis with multilevel disc space narrowing, endplate osteophytes, uncovertebral hypertrophy and facet arthrosis. Disc space narrowing is greatest at C5-C6 and C6-C7 (moderate to moderately advanced at these levels). Multilevel ventral osteophytes, most prominent at C5-C6 and C6-C7. Left chest multi-lead implantable cardiac device. Prior median sternotomy. IMPRESSION: Known  subacute fracture of the C6 vertebral body, extending through a C6-C7 ventral osteophyte, better appreciated on the prior cervical spine CT of 12/10/2021. No significant spondylolisthesis or evidence of dynamic instability at the level of the patient's known subacute fracture (C6-C7). Grade 1 spondylolisthesis at C2-C3, C3-C4, C4-C5 and C5-C6, as described and likely due to facet arthrosis. Cervical spondylosis, as described. Electronically Signed   By: Kyle  Golden D.O.   On: 01/07/2022 15:41   DG   Abd 1 View  Result Date: 01/20/2022 CLINICAL DATA:  Abdominal pain. EXAM: ABDOMEN - 1 VIEW COMPARISON:  01/17/2022 and prior studies.  CT, 12/11/2021. FINDINGS: Mild distension of the colon, similar to the prior exam. No small bowel dilation. Stable gastrostomy tube in the left central upper abdomen. Scattered stable surgical vascular clips in the central abdomen. Stable vascular calcifications. IMPRESSION: 1. No acute findings. 2. Mild distension of the colon similar to the most recent prior exam, but improved from 01/15/2022. No evidence of bowel obstruction. Electronically Signed   By: David  Ormond M.D.   On: 01/20/2022 14:07   DG Abd 1 View  Result Date: 01/17/2022 CLINICAL DATA:  Abdominal pain. EXAM: ABDOMEN - 1 VIEW COMPARISON:  01/15/2022 FINDINGS: Gastrostomy tube is identified within the left upper quadrant of the abdomen. Interval decrease and enteric contrast material within the colon. Improving gaseous distension of the colon. Transverse colon measures 8 cm on today's exam versus 10 cm previously. Cholecystectomy clips. IMPRESSION: Improving gaseous distension of the colon with decrease amount of enteric contrast material within the colon. Transverse colon measures 8 cm on today's exam versus 10 cm previously. Electronically Signed   By: Taylor  Stroud M.D.   On: 01/17/2022 12:28   CT HEAD WO CONTRAST  Result Date: 01/06/2022 CLINICAL DATA:  Provided history: Delirium. Additional history provided  by scanning technologist: Altered mental status, delirium. EXAM: CT HEAD WITHOUT CONTRAST TECHNIQUE: Contiguous axial images were obtained from the base of the skull through the vertex without intravenous contrast. RADIATION DOSE REDUCTION: This exam was performed according to the departmental dose-optimization program which includes automated exposure control, adjustment of the mA and/or kV according to patient size and/or use of iterative reconstruction technique. COMPARISON:  Prior head CT examinations 12/10/2021 and earlier. FINDINGS: Brain: Moderate cerebral and cerebellar atrophy. Commensurate prominence of the ventricles and sulci. Redemonstrated chronic lacunar infarct within the right thalamus. Redemonstrated small chronic infarcts within the bilateral cerebellar hemispheres. There is no acute intracranial hemorrhage. No demarcated cortical infarct. No extra-axial fluid collection. No evidence of an intracranial mass. No midline shift. Vascular: No hyperdense vessel. Atherosclerotic calcifications. Skull: No focal suspicious marrow lesion. Sinuses/Orbits: No mass or acute finding within the imaged orbits. Small mucous retention cysts within the bilateral maxillary sinuses. IMPRESSION: No evidence of acute intracranial abnormality. Redemonstrated chronic infarcts within the right thalamus and bilateral cerebellar hemispheres. Moderate parenchymal atrophy. Small mucous retention cysts within the bilateral sphenoid sinuses. Electronically Signed   By: Kyle  Golden D.O.   On: 01/06/2022 14:20   IR GASTROSTOMY TUBE MOD SED  Result Date: 01/15/2022 CLINICAL DATA:  Dysphagia and need for percutaneous gastrostomy tube for long-term nutrition. EXAM: PERCUTANEOUS GASTROSTOMY TUBE PLACEMENT ANESTHESIA/SEDATION: Moderate (conscious) sedation was employed during this procedure. A total of Versed 2.0 mg and Fentanyl 100 mcg was administered intravenously by radiology nursing. Moderate Sedation Time: 20 minutes. The  patient's level of consciousness and vital signs were monitored continuously by radiology nursing throughout the procedure under my direct supervision. CONTRAST:  25mL OMNIPAQUE IOHEXOL 300 MG/ML  SOLN MEDICATIONS: 1 g IV vancomycin. IV antibiotic was administered in an appropriate time interval prior to needle puncture of the skin. FLUOROSCOPY TIME:  3 minutes and 30 seconds.  50.0 mGy. PROCEDURE: The procedure, risks, benefits, and alternatives were explained to the patient's daughter. Questions regarding the procedure were encouraged and answered. The patient's daughter understands and consents to the procedure. A 5-French catheter was then advanced through the patient's mouth under fluoroscopy into the esophagus   and to the level of the stomach. This catheter was used to insufflate the stomach with air under fluoroscopy. The abdominal wall was prepped with chlorhexidine in a sterile fashion, and a sterile drape was applied covering the operative field. A sterile gown and sterile gloves were used for the procedure. Local anesthesia was provided with 1% Lidocaine. A skin incision was made in the upper abdominal wall. Under fluoroscopy, an 18 gauge trocar needle was advanced into the stomach. Contrast injection was performed to confirm intraluminal position of the needle tip. A single T tack was then deployed in the lumen of the stomach. This was brought up to tension at the skin surface. Over a guidewire, a 9-French sheath was advanced into the lumen of the stomach. The wire was left in place as a safety wire. A loop snare device from a percutaneous gastrostomy kit was then advanced into the stomach. A floppy guide wire was advanced through the orogastric catheter under fluoroscopy in the stomach. The loop snare advanced through the percutaneous gastric access was used to snare the guide wire. This allowed withdrawal of the loop snare out of the patient's mouth by retraction of the orogastric catheter and wire. A  20-French bumper retention gastrostomy tube was looped around the snare device. It was then pulled back through the patient's mouth. The retention bumper was brought up to the anterior gastric wall. The T tack suture was cut at the skin. The exiting gastrostomy tube was cut to appropriate length and a feeding adapter applied. The catheter was injected with contrast material to confirm position and a fluoroscopic spot image saved. The tube was then flushed with saline. A dressing was applied over the gastrostomy exit site. COMPLICATIONS: None. FINDINGS: The stomach distended well with air allowing safe placement of the gastrostomy tube. After placement, the tip of the gastrostomy tube lies in the body of the stomach. IMPRESSION: Percutaneous gastrostomy with placement of a 20-French bumper retention tube in the body of the stomach. This tube can be used for percutaneous feeds beginning in 24 hours after placement. Electronically Signed   By: Glenn  Yamagata M.D.   On: 01/15/2022 16:01   DG CHEST PORT 1 VIEW  Result Date: 01/06/2022 CLINICAL DATA:  Altered mental status. EXAM: PORTABLE CHEST 1 VIEW COMPARISON:  One-view chest x-ray 12/10/2021 FINDINGS: Pacing wires are stable. Cardiac enlargement is stable. Atherosclerotic changes are again noted at the aortic arch. Lung volumes are low. No edema or effusion is present. No focal airspace disease is present. IMPRESSION: 1. Stable cardiomegaly without failure. 2. No acute cardiopulmonary disease. Electronically Signed   By: Christopher  Mattern M.D.   On: 01/06/2022 15:36   DG Abd Portable 1V  Result Date: 01/15/2022 CLINICAL DATA:  Evaluate bowel opacification. EXAM: PORTABLE ABDOMEN - 1 VIEW COMPARISON:  01/13/2022 FINDINGS: Weighted tip enteric tube overlies expected location of the gastric antrum. Enteric contrast extends to the level of the rectum. Moderate to marked colonic gaseous distention without evidence of enteric obstruction. Nondiagnostic  evaluation for pneumoperitoneum. No definite pneumatosis or portal venous gas. Post cholecystectomy No acute osseous abnormalities. Degenerative change the lower lumbar spine is suspected though incompletely evaluated. IMPRESSION: 1. Enteric contrast seen to the level of the rectum. 2. Moderate to marked gaseous distention of the colon without evidence of enteric obstruction. Electronically Signed   By: John  Watts M.D.   On: 01/15/2022 13:09   DG Abd Portable 1V  Result Date: 01/13/2022 CLINICAL DATA:  Feeding tube placement EXAM: PORTABLE ABDOMEN -   1 VIEW COMPARISON:  CT scan 12/11/2021 FINDINGS: The feeding tube terminates in the antropyloric region. Pacer leads noted. Prior CABG. Atherosclerotic calcification of the aortic arch. Prominence of stool in the upper colon. IMPRESSION: 1. Pacer lead terminates in the antropyloric region of the stomach. 2.  Aortic Atherosclerosis (ICD10-I70.0). 3. Possible constipation. Electronically Signed   By: Walter  Liebkemann M.D.   On: 01/13/2022 15:27    Microbiology: Results for orders placed or performed during the hospital encounter of 01/06/22  Urine Culture     Status: None   Collection Time: 01/17/22  8:54 AM   Specimen: Urine, Catheterized  Result Value Ref Range Status   Specimen Description URINE, CATHETERIZED  Final   Special Requests NONE  Final   Culture   Final    NO GROWTH Performed at [facility], [address] [place], [place] [address]    Report Status 01/18/2022 FINAL  Final    Labs: CBC: Recent Labs  Lab 01/17/22 0550 01/18/22 0323 01/19/22 0226 01/20/22 0305 01/21/22 0604  WBC 7.4 7.3 8.0 8.8 8.5  NEUTROABS  --   --   --   --  6.2  HGB 9.2* 9.4* 8.9* 8.4* 8.7*  HCT 28.7* 29.5* 28.9* 26.6* 26.7*  MCV 97.0 98.3 99.3 99.6 98.2  PLT 98* 92* 118* 109* 107*   Basic Metabolic Panel: Recent Labs  Lab 01/14/22 1648 01/15/22 0538 01/16/22 0443 01/17/22 0550 01/18/22 0323 01/19/22 0226 01/21/22 0604  NA  --    <  > 135 137 134* 137 133*  K  --    < > 3.2* 4.5 4.4 5.0 4.9  CL  --    < > 105 108 105 106 103  CO2  --    < > 25 23 22 27 24  GLUCOSE  --    < > 125* 104* 110* 171* 165*  BUN  --    < > 18 22 21 24* 26*  CREATININE  --    < > 1.22 1.16 1.12 1.12 1.00  CALCIUM  --    < > 8.7* 8.5* 8.4* 8.7* 9.1  MG 2.1   < > 1.9 1.9 1.9 2.0 1.9  PHOS 2.5  --  3.1  --   --   --   --    < > = values in this interval not displayed.   Liver Function Tests: No results for input(s): AST, ALT, ALKPHOS, BILITOT, PROT, ALBUMIN in the last 168 hours. CBG: Recent Labs  Lab 01/20/22 1946 01/20/22 2315 01/21/22 0328 01/21/22 0748 01/21/22 1212  GLUCAP 225* 188* 162* 173* 167*    Discharge time spent: greater than 30 minutes.  Signed:  A , DO [facility] 01/21/2022 

## 2022-01-21 NOTE — TOC Benefit Eligibility Note (Signed)
Patient Advocate Encounter  Received notification that the request for prior authorization for Pantoprazole Sodium powder has been denied due to PANTOPRAZOLE POW SODIUM is not covered. The requested medication/device is not a Part D-eligible product as defined by the Medicare Part D benefit and is not covered under your Part D prescription drug plan.Lyndel Safe, [place] Patient Advocate Specialist [place] Patient Advocate Team Direct Number: [phone]  Fax: [phone]

## 2022-01-22 ENCOUNTER — Telehealth: Payer: Self-pay | Admitting: Family Medicine

## 2022-01-22 ENCOUNTER — Telehealth: Payer: Self-pay

## 2022-01-22 NOTE — TOC Progression Note (Signed)
Transition of Care [facility]) - Progression Note    Patient Details  Name: ESSEX PERRY MRN: [id number] Date of Birth: 04/09/1933  Transition of Care [facility]) CM/SW [place], RN Phone Number:[phone]  01/22/2022, 10:46 AM  Clinical Narrative:    Home health referral has been accepted by [facility] with Alvis Lemmings.    Expected Discharge Plan: [place] Barriers to Discharge: Barriers Resolved  Expected Discharge Plan and Services Expected Discharge Plan: [place]   Discharge Planning Services: CM Consult Post Acute Care Choice: [place] Living arrangements for the past 2 months: Single Family Home Expected Discharge Date: 12/17/21                                     Social Determinants of Health (SDOH) Interventions    Readmission Risk Interventions    01/16/2022    2:12 PM  Readmission Risk Prevention Plan  Transportation Screening Complete  Medication Review (RN Care Manager) Referral to Pharmacy  PCP or Specialist appointment within 3-5 days of discharge Complete  HRI or [place] Complete  SW Recovery Care/Counseling Consult Complete  Palliative Care Screening Complete  [place] Not Applicable

## 2022-01-22 NOTE — TOC Progression Note (Addendum)
Transition of Care [facility]) - Progression Note    Patient Details  Name: [name] MRN: [id number] Date of Birth: 01-16-1933  Transition of Care [facility]) CM/SW [place], RN Phone Number:[phone]  01/22/2022, 10:54 AM  Clinical Narrative:    Home Health referral has been accepted by [facility] with Alvis Lemmings. CM will reach out to contacts to update them.  1209 CM spoke with daughter [name] at [phone]. [name] states that she spoke with Nurse Raynelle Highland at [place] who stated that patient would be good with [facility] once they received the new referral . CM made daughter aware that [facility] will not accept patient for Home Health referral and that referral can be accepted by [facility] with [facility]. Daughter is ok with [place] for home health services. Daughter also confirms that tube feeds have been delivered. There are currently no other needs. TOC will sign off.    Expected Discharge Plan: [place] Barriers to Discharge: Continued Medical Work up  Expected Discharge Plan and Services Expected Discharge Plan: [place] Choice: [place] arrangements for the past 2 months: Single Family Home Expected Discharge Date: 01/21/22                                     Social Determinants of Health (SDOH) Interventions    Readmission Risk Interventions    01/16/2022    2:12 PM  Readmission Risk Prevention Plan  Transportation Screening Complete  Medication Review (RN Care Manager) Referral to Pharmacy  PCP or Specialist appointment within 3-5 days of discharge Complete  HRI or [place] Complete  SW Recovery Care/Counseling Consult Complete  [place] Not Applicable

## 2022-01-22 NOTE — Telephone Encounter (Signed)
Transition Care Management Follow-up Telephone Call Date of discharge and from where: [place] 01-21-22 Dx: acute metabolic encephalopathy How have you been since you were released from the hospital? Doing ok  Any questions or concerns? No  Items Reviewed: Did the pt receive and understand the discharge instructions provided? Yes  Medications obtained and verified? Yes  Other? No  Any new allergies since your discharge? No  Dietary orders reviewed? Yes Do you have support at home? Yes   Home Care and Equipment/Supplies: Were home health services ordered? Yes  If so, what is the name of the agency? [facility]   Has the agency set up a time to come to the patient's home? no Were any new equipment or medical supplies ordered?  yes What is the name of the medical supply agency? Unsure 2 Were you able to get the supplies/equipment? no Do you have any questions related to the use of the equipment or supplies? No  Functional Questionnaire: (I = Independent and D = Dependent) ADLs: D  Bathing/Dressing- D  Meal Prep- D  Eating- D  Maintaining continence- D  Transferring/Ambulation- D  Managing Meds- D  Follow up appointments reviewed:  PCP Hospital f/u appt confirmed? Yes  Scheduled to see Ronnie Doss  DO on 01-28-22 @ 3pm. [name] f/u appt confirmed? No  . Are transportation arrangements needed? No  If their condition worsens, is the pt aware to call PCP or go to the Emergency Dept.? Yes Was the patient provided with contact information for the PCP's office or ED? Yes Was to pt encouraged to call back with questions or concerns? Yes

## 2022-01-22 NOTE — Telephone Encounter (Signed)
Aware HH order was placed by hospital yesterday, they should be contacted in the next couple days.

## 2022-01-22 NOTE — TOC Progression Note (Addendum)
Transition of Care [facility]) - Progression Note    Patient Details  Name: [name] MRN: [id number] Date of Birth: [birth year]  Transition of Care [facility]) CM/SW Contact  Angelita Ingles, RN Phone Number:[phone]  01/22/2022, 8:58 AM  Clinical Narrative:    CM received message stating that daughter called stating that she called [facility] and they do not have a referral for this patient. CM spoke t to [facility] with [facility] to verify. Awaiting  return  call. Will attempted to call daughter [name] at [phone] with no answer. Voicemail has been left including call back number for CM.   1030 [facility] states that patient was denied for services on 01/03/22. CM has made numerous attempts to contact daughter to verify who she has been communicating with at [facility]. Voicemail on first ring. CM has sent referral to [facility]. Awaiting response.    Expected Discharge Plan: [place] Barriers to Discharge: Continued Medical Work up  Expected Discharge Plan and Services Expected Discharge Plan: [place] Choice: [place] arrangements for the past 2 months: Single Family Home Expected Discharge Date: 01/21/22                                     Social Determinants of Health (SDOH) Interventions    Readmission Risk Interventions    01/16/2022    2:12 PM  Readmission Risk Prevention Plan  Transportation Screening Complete  Medication Review (RN Care Manager) Referral to Pharmacy  PCP or Specialist appointment within 3-5 days of discharge Complete  HRI or [place] Complete  SW Recovery Care/Counseling Consult Complete  [place] Not Applicable

## 2022-01-23 DIAGNOSIS — E119 Type 2 diabetes mellitus without complications: Secondary | ICD-10-CM | POA: Diagnosis not present

## 2022-01-23 DIAGNOSIS — R1310 Dysphagia, unspecified: Secondary | ICD-10-CM | POA: Diagnosis not present

## 2022-01-23 DIAGNOSIS — E43 Unspecified severe protein-calorie malnutrition: Secondary | ICD-10-CM | POA: Diagnosis not present

## 2022-01-23 DIAGNOSIS — Z931 Gastrostomy status: Secondary | ICD-10-CM | POA: Diagnosis not present

## 2022-01-23 DIAGNOSIS — G9341 Metabolic encephalopathy: Secondary | ICD-10-CM | POA: Diagnosis not present

## 2022-01-24 ENCOUNTER — Encounter (HOSPITAL_COMMUNITY): Payer: Self-pay

## 2022-01-24 ENCOUNTER — Inpatient Hospital Stay (HOSPITAL_COMMUNITY)
Admission: EM | Admit: 2022-01-24 | Discharge: 2022-02-19 | DRG: 862 | Disposition: A | Discharge status: Home Health | Payer: Medicare Other | Attending: Internal Medicine | Admitting: Internal Medicine

## 2022-01-24 ENCOUNTER — Other Ambulatory Visit: Payer: Self-pay

## 2022-01-24 ENCOUNTER — Emergency Department (HOSPITAL_COMMUNITY): Payer: Medicare Other

## 2022-01-24 DIAGNOSIS — Z79899 Other long term (current) drug therapy: Secondary | ICD-10-CM

## 2022-01-24 DIAGNOSIS — Z7984 Long term (current) use of oral hypoglycemic drugs: Secondary | ICD-10-CM

## 2022-01-24 DIAGNOSIS — M19012 Primary osteoarthritis, left shoulder: Secondary | ICD-10-CM | POA: Diagnosis present

## 2022-01-24 DIAGNOSIS — Z923 Personal history of irradiation: Secondary | ICD-10-CM | POA: Diagnosis not present

## 2022-01-24 DIAGNOSIS — L89316 Pressure-induced deep tissue damage of right buttock: Secondary | ICD-10-CM | POA: Diagnosis present

## 2022-01-24 DIAGNOSIS — E43 Unspecified severe protein-calorie malnutrition: Secondary | ICD-10-CM | POA: Diagnosis not present

## 2022-01-24 DIAGNOSIS — E785 Hyperlipidemia, unspecified: Secondary | ICD-10-CM | POA: Diagnosis present

## 2022-01-24 DIAGNOSIS — R633 Feeding difficulties, unspecified: Secondary | ICD-10-CM | POA: Diagnosis not present

## 2022-01-24 DIAGNOSIS — Z7401 Bed confinement status: Secondary | ICD-10-CM | POA: Diagnosis not present

## 2022-01-24 DIAGNOSIS — L89326 Pressure-induced deep tissue damage of left buttock: Secondary | ICD-10-CM | POA: Diagnosis present

## 2022-01-24 DIAGNOSIS — Y842 Radiological procedure and radiotherapy as the cause of abnormal reaction of the patient, or of later complication, without mention of misadventure at the time of the procedure: Secondary | ICD-10-CM | POA: Diagnosis present

## 2022-01-24 DIAGNOSIS — D638 Anemia in other chronic diseases classified elsewhere: Secondary | ICD-10-CM | POA: Diagnosis not present

## 2022-01-24 DIAGNOSIS — E870 Hyperosmolality and hypernatremia: Secondary | ICD-10-CM

## 2022-01-24 DIAGNOSIS — Z888 Allergy status to other drugs, medicaments and biological substances status: Secondary | ICD-10-CM

## 2022-01-24 DIAGNOSIS — N281 Cyst of kidney, acquired: Secondary | ICD-10-CM | POA: Diagnosis not present

## 2022-01-24 DIAGNOSIS — K591 Functional diarrhea: Secondary | ICD-10-CM

## 2022-01-24 DIAGNOSIS — I110 Hypertensive heart disease with heart failure: Secondary | ICD-10-CM | POA: Diagnosis present

## 2022-01-24 DIAGNOSIS — Y838 Other surgical procedures as the cause of abnormal reaction of the patient, or of later complication, without mention of misadventure at the time of the procedure: Secondary | ICD-10-CM | POA: Diagnosis present

## 2022-01-24 DIAGNOSIS — G4733 Obstructive sleep apnea (adult) (pediatric): Secondary | ICD-10-CM | POA: Diagnosis present

## 2022-01-24 DIAGNOSIS — N3021 Other chronic cystitis with hematuria: Secondary | ICD-10-CM | POA: Diagnosis present

## 2022-01-24 DIAGNOSIS — R5382 Chronic fatigue, unspecified: Secondary | ICD-10-CM | POA: Diagnosis present

## 2022-01-24 DIAGNOSIS — L03818 Cellulitis of other sites: Secondary | ICD-10-CM

## 2022-01-24 DIAGNOSIS — K9423 Gastrostomy malfunction: Secondary | ICD-10-CM | POA: Diagnosis present

## 2022-01-24 DIAGNOSIS — J449 Chronic obstructive pulmonary disease, unspecified: Secondary | ICD-10-CM | POA: Diagnosis not present

## 2022-01-24 DIAGNOSIS — R339 Retention of urine, unspecified: Secondary | ICD-10-CM | POA: Diagnosis not present

## 2022-01-24 DIAGNOSIS — H9190 Unspecified hearing loss, unspecified ear: Secondary | ICD-10-CM | POA: Diagnosis present

## 2022-01-24 DIAGNOSIS — R109 Unspecified abdominal pain: Secondary | ICD-10-CM | POA: Diagnosis not present

## 2022-01-24 DIAGNOSIS — Z931 Gastrostomy status: Secondary | ICD-10-CM

## 2022-01-24 DIAGNOSIS — I2510 Atherosclerotic heart disease of native coronary artery without angina pectoris: Secondary | ICD-10-CM | POA: Diagnosis present

## 2022-01-24 DIAGNOSIS — E119 Type 2 diabetes mellitus without complications: Secondary | ICD-10-CM

## 2022-01-24 DIAGNOSIS — I5020 Unspecified systolic (congestive) heart failure: Secondary | ICD-10-CM | POA: Diagnosis present

## 2022-01-24 DIAGNOSIS — M479 Spondylosis, unspecified: Secondary | ICD-10-CM | POA: Diagnosis present

## 2022-01-24 DIAGNOSIS — D649 Anemia, unspecified: Secondary | ICD-10-CM

## 2022-01-24 DIAGNOSIS — Z85828 Personal history of other malignant neoplasm of skin: Secondary | ICD-10-CM

## 2022-01-24 DIAGNOSIS — K9429 Other complications of gastrostomy: Secondary | ICD-10-CM | POA: Diagnosis not present

## 2022-01-24 DIAGNOSIS — I495 Sick sinus syndrome: Secondary | ICD-10-CM | POA: Diagnosis present

## 2022-01-24 DIAGNOSIS — Z8546 Personal history of malignant neoplasm of prostate: Secondary | ICD-10-CM

## 2022-01-24 DIAGNOSIS — L03311 Cellulitis of abdominal wall: Secondary | ICD-10-CM | POA: Diagnosis present

## 2022-01-24 DIAGNOSIS — R079 Chest pain, unspecified: Secondary | ICD-10-CM | POA: Diagnosis not present

## 2022-01-24 DIAGNOSIS — Z87442 Personal history of urinary calculi: Secondary | ICD-10-CM

## 2022-01-24 DIAGNOSIS — I480 Paroxysmal atrial fibrillation: Secondary | ICD-10-CM | POA: Diagnosis present

## 2022-01-24 DIAGNOSIS — N3040 Irradiation cystitis without hematuria: Secondary | ICD-10-CM | POA: Diagnosis not present

## 2022-01-24 DIAGNOSIS — S12500A Unspecified displaced fracture of sixth cervical vertebra, initial encounter for closed fracture: Secondary | ICD-10-CM | POA: Diagnosis present

## 2022-01-24 DIAGNOSIS — I959 Hypotension, unspecified: Secondary | ICD-10-CM | POA: Diagnosis not present

## 2022-01-24 DIAGNOSIS — W1830XA Fall on same level, unspecified, initial encounter: Secondary | ICD-10-CM | POA: Diagnosis present

## 2022-01-24 DIAGNOSIS — Z825 Family history of asthma and other chronic lower respiratory diseases: Secondary | ICD-10-CM

## 2022-01-24 DIAGNOSIS — Z6824 Body mass index (BMI) 24.0-24.9, adult: Secondary | ICD-10-CM

## 2022-01-24 DIAGNOSIS — E1152 Type 2 diabetes mellitus with diabetic peripheral angiopathy with gangrene: Secondary | ICD-10-CM | POA: Diagnosis present

## 2022-01-24 DIAGNOSIS — R319 Hematuria, unspecified: Secondary | ICD-10-CM | POA: Diagnosis not present

## 2022-01-24 DIAGNOSIS — Z8701 Personal history of pneumonia (recurrent): Secondary | ICD-10-CM

## 2022-01-24 DIAGNOSIS — Z8249 Family history of ischemic heart disease and other diseases of the circulatory system: Secondary | ICD-10-CM

## 2022-01-24 DIAGNOSIS — Z8042 Family history of malignant neoplasm of prostate: Secondary | ICD-10-CM

## 2022-01-24 DIAGNOSIS — Z1621 Resistance to vancomycin: Secondary | ICD-10-CM | POA: Diagnosis present

## 2022-01-24 DIAGNOSIS — Z951 Presence of aortocoronary bypass graft: Secondary | ICD-10-CM | POA: Diagnosis not present

## 2022-01-24 DIAGNOSIS — Z4659 Encounter for fitting and adjustment of other gastrointestinal appliance and device: Secondary | ICD-10-CM | POA: Diagnosis not present

## 2022-01-24 DIAGNOSIS — Z8679 Personal history of other diseases of the circulatory system: Secondary | ICD-10-CM

## 2022-01-24 DIAGNOSIS — R197 Diarrhea, unspecified: Secondary | ICD-10-CM

## 2022-01-24 DIAGNOSIS — Z885 Allergy status to narcotic agent status: Secondary | ICD-10-CM

## 2022-01-24 DIAGNOSIS — Z9049 Acquired absence of other specified parts of digestive tract: Secondary | ICD-10-CM

## 2022-01-24 DIAGNOSIS — R7989 Other specified abnormal findings of blood chemistry: Secondary | ICD-10-CM | POA: Diagnosis not present

## 2022-01-24 DIAGNOSIS — R1310 Dysphagia, unspecified: Secondary | ICD-10-CM

## 2022-01-24 DIAGNOSIS — K219 Gastro-esophageal reflux disease without esophagitis: Secondary | ICD-10-CM | POA: Diagnosis present

## 2022-01-24 DIAGNOSIS — R008 Other abnormalities of heart beat: Secondary | ICD-10-CM | POA: Diagnosis not present

## 2022-01-24 DIAGNOSIS — S12501S Unspecified nondisplaced fracture of sixth cervical vertebra, sequela: Secondary | ICD-10-CM | POA: Diagnosis not present

## 2022-01-24 DIAGNOSIS — Z7189 Other specified counseling: Secondary | ICD-10-CM

## 2022-01-24 DIAGNOSIS — S32512A Fracture of superior rim of left pubis, initial encounter for closed fracture: Secondary | ICD-10-CM | POA: Diagnosis not present

## 2022-01-24 DIAGNOSIS — T8141XA Infection following a procedure, superficial incisional surgical site, initial encounter: Principal | ICD-10-CM | POA: Diagnosis present

## 2022-01-24 DIAGNOSIS — I96 Gangrene, not elsewhere classified: Secondary | ICD-10-CM | POA: Diagnosis present

## 2022-01-24 DIAGNOSIS — K9422 Gastrostomy infection: Secondary | ICD-10-CM | POA: Diagnosis not present

## 2022-01-24 DIAGNOSIS — Z87891 Personal history of nicotine dependence: Secondary | ICD-10-CM

## 2022-01-24 DIAGNOSIS — Z4682 Encounter for fitting and adjustment of non-vascular catheter: Secondary | ICD-10-CM | POA: Diagnosis not present

## 2022-01-24 DIAGNOSIS — K9420 Gastrostomy complication, unspecified: Secondary | ICD-10-CM | POA: Diagnosis not present

## 2022-01-24 DIAGNOSIS — L0390 Cellulitis, unspecified: Secondary | ICD-10-CM | POA: Diagnosis present

## 2022-01-24 DIAGNOSIS — I441 Atrioventricular block, second degree: Secondary | ICD-10-CM | POA: Diagnosis present

## 2022-01-24 DIAGNOSIS — Z87440 Personal history of urinary (tract) infections: Secondary | ICD-10-CM

## 2022-01-24 DIAGNOSIS — Z950 Presence of cardiac pacemaker: Secondary | ICD-10-CM

## 2022-01-24 DIAGNOSIS — N3289 Other specified disorders of bladder: Secondary | ICD-10-CM | POA: Diagnosis not present

## 2022-01-24 DIAGNOSIS — Z7951 Long term (current) use of inhaled steroids: Secondary | ICD-10-CM

## 2022-01-24 DIAGNOSIS — D509 Iron deficiency anemia, unspecified: Secondary | ICD-10-CM | POA: Diagnosis present

## 2022-01-24 DIAGNOSIS — M2578 Osteophyte, vertebrae: Secondary | ICD-10-CM | POA: Diagnosis not present

## 2022-01-24 DIAGNOSIS — I10 Essential (primary) hypertension: Secondary | ICD-10-CM | POA: Diagnosis not present

## 2022-01-24 DIAGNOSIS — L8990 Pressure ulcer of unspecified site, unspecified stage: Secondary | ICD-10-CM

## 2022-01-24 DIAGNOSIS — R410 Disorientation, unspecified: Secondary | ICD-10-CM | POA: Diagnosis not present

## 2022-01-24 DIAGNOSIS — D696 Thrombocytopenia, unspecified: Secondary | ICD-10-CM | POA: Diagnosis present

## 2022-01-24 DIAGNOSIS — Z8673 Personal history of transient ischemic attack (TIA), and cerebral infarction without residual deficits: Secondary | ICD-10-CM

## 2022-01-24 DIAGNOSIS — L089 Local infection of the skin and subcutaneous tissue, unspecified: Secondary | ICD-10-CM | POA: Diagnosis not present

## 2022-01-24 DIAGNOSIS — G9341 Metabolic encephalopathy: Secondary | ICD-10-CM | POA: Diagnosis not present

## 2022-01-24 DIAGNOSIS — Z96653 Presence of artificial knee joint, bilateral: Secondary | ICD-10-CM | POA: Diagnosis present

## 2022-01-24 DIAGNOSIS — Z880 Allergy status to penicillin: Secondary | ICD-10-CM

## 2022-01-24 DIAGNOSIS — I5022 Chronic systolic (congestive) heart failure: Secondary | ICD-10-CM | POA: Diagnosis not present

## 2022-01-24 DIAGNOSIS — M2550 Pain in unspecified joint: Secondary | ICD-10-CM | POA: Diagnosis not present

## 2022-01-24 DIAGNOSIS — Z7901 Long term (current) use of anticoagulants: Secondary | ICD-10-CM

## 2022-01-24 DIAGNOSIS — B952 Enterococcus as the cause of diseases classified elsewhere: Secondary | ICD-10-CM | POA: Diagnosis present

## 2022-01-24 DIAGNOSIS — M19011 Primary osteoarthritis, right shoulder: Secondary | ICD-10-CM | POA: Diagnosis present

## 2022-01-24 LAB — LACTIC ACID, PLASMA
Lactic Acid, Venous: 2.4 mmol/L (ref 0.5–1.9)
Lactic Acid, Venous: 2.5 mmol/L (ref 0.5–1.9)
Lactic Acid, Venous: 2.9 mmol/L (ref 0.5–1.9)

## 2022-01-24 LAB — URINALYSIS, ROUTINE W REFLEX MICROSCOPIC
Bilirubin Urine: NEGATIVE
Glucose, UA: NEGATIVE mg/dL
Ketones, ur: NEGATIVE mg/dL
Nitrite: NEGATIVE
Protein, ur: 100 mg/dL — AB
RBC / HPF: >50 RBC/hpf — ABNORMAL HIGH (ref 0–5)
Specific Gravity, Urine: 1.021 (ref 1.005–1.030)
pH: 5 (ref 5.0–8.0)

## 2022-01-24 LAB — COMPREHENSIVE METABOLIC PANEL
ALT: 124 U/L — ABNORMAL HIGH (ref 0–44)
AST: 154 U/L — ABNORMAL HIGH (ref 15–41)
Albumin: 1.8 g/dL — ABNORMAL LOW (ref 3.5–5.0)
Alkaline Phosphatase: 142 U/L — ABNORMAL HIGH (ref 38–126)
Anion gap: 7 (ref 5–15)
BUN: 43 mg/dL — ABNORMAL HIGH (ref 8–23)
CO2: 28 mmol/L (ref 22–32)
Calcium: 9.8 mg/dL (ref 8.9–10.3)
Chloride: 103 mmol/L (ref 98–111)
Creatinine, Ser: 1.09 mg/dL (ref 0.61–1.24)
GFR, Estimated: >60 mL/min (ref >60)
Glucose, Bld: 123 mg/dL — ABNORMAL HIGH (ref 70–99)
Potassium: 4.4 mmol/L (ref 3.5–5.1)
Sodium: 138 mmol/L (ref 135–145)
Total Bilirubin: 0.5 mg/dL (ref 0.3–1.2)
Total Protein: 5.1 g/dL — ABNORMAL LOW (ref 6.5–8.1)

## 2022-01-24 LAB — CBC WITH DIFFERENTIAL/PLATELET
Abs Immature Granulocytes: 0.07 10*3/uL (ref 0.00–0.07)
Basophils Absolute: 0 10*3/uL (ref 0.0–0.1)
Basophils Relative: 0 %
Eosinophils Absolute: 0 10*3/uL (ref 0.0–0.5)
Eosinophils Relative: 0 %
HCT: 25.8 % — ABNORMAL LOW (ref 39.0–52.0)
Hemoglobin: 7.7 g/dL — ABNORMAL LOW (ref 13.0–17.0)
Immature Granulocytes: 1 %
Lymphocytes Relative: 14 %
Lymphs Abs: 0.9 10*3/uL (ref 0.7–4.0)
MCH: 30.7 pg (ref 26.0–34.0)
MCHC: 29.8 g/dL — ABNORMAL LOW (ref 30.0–36.0)
MCV: 102.8 fL — ABNORMAL HIGH (ref 80.0–100.0)
Monocytes Absolute: 0.5 10*3/uL (ref 0.1–1.0)
Monocytes Relative: 8 %
Neutro Abs: 4.8 10*3/uL (ref 1.7–7.7)
Neutrophils Relative %: 77 %
Platelets: 115 10*3/uL — ABNORMAL LOW (ref 150–400)
RBC: 2.51 MIL/uL — ABNORMAL LOW (ref 4.22–5.81)
RDW: 18.9 % — ABNORMAL HIGH (ref 11.5–15.5)
WBC: 6.4 10*3/uL (ref 4.0–10.5)
nRBC: 0 % (ref 0.0–0.2)

## 2022-01-24 LAB — C DIFFICILE QUICK SCREEN W PCR REFLEX
C Diff antigen: NEGATIVE
C Diff interpretation: NOT DETECTED
C Diff toxin: NEGATIVE

## 2022-01-24 LAB — LIPASE, BLOOD: Lipase: 70 U/L — ABNORMAL HIGH (ref 11–51)

## 2022-01-24 MED ORDER — SODIUM CHLORIDE 0.9 % IV BOLUS
1000.0000 mL | Freq: Once | INTRAVENOUS | Status: AC
  Administered 2022-01-24: 1000 mL via INTRAVENOUS

## 2022-01-24 MED ORDER — SODIUM CHLORIDE 0.9 % IV SOLN
INTRAVENOUS | Status: AC
Start: 2022-01-24 — End: 2022-01-25

## 2022-01-24 MED ORDER — SODIUM CHLORIDE 0.9 % IV SOLN
1.0000 g | Freq: Three times a day (TID) | INTRAVENOUS | Status: DC
  Administered 2022-01-24 – 2022-01-27 (×8): 1 g via INTRAVENOUS
  Filled 2022-01-24 (×9): qty 20

## 2022-01-24 MED ORDER — AMIODARONE HCL 200 MG PO TABS
200.0000 mg | ORAL_TABLET | Freq: Every day | ORAL | Status: DC
  Administered 2022-01-25 – 2022-02-19 (×23): 200 mg
  Filled 2022-01-24 (×26): qty 1

## 2022-01-24 MED ORDER — DIPHENOXYLATE-ATROPINE 2.5-0.025 MG/5ML PO LIQD
Freq: Four times a day (QID) | ORAL | Status: DC | PRN
  Administered 2022-01-24 – 2022-02-19 (×10): 5 mL
  Filled 2022-01-24 (×10): qty 5

## 2022-01-24 MED ORDER — APIXABAN 5 MG PO TABS
5.0000 mg | ORAL_TABLET | Freq: Two times a day (BID) | ORAL | Status: DC
  Administered 2022-01-24 – 2022-02-06 (×22): 5 mg
  Filled 2022-01-24 (×24): qty 1

## 2022-01-24 MED ORDER — FAMOTIDINE 20 MG PO TABS
20.0000 mg | ORAL_TABLET | Freq: Two times a day (BID) | ORAL | Status: DC
  Administered 2022-01-24 – 2022-02-19 (×48): 20 mg
  Filled 2022-01-24 (×50): qty 1

## 2022-01-24 MED ORDER — FLUTICASONE FUROATE-VILANTEROL 200-25 MCG/ACT IN AEPB
1.0000 | INHALATION_SPRAY | Freq: Every day | RESPIRATORY_TRACT | Status: DC
  Administered 2022-01-25 – 2022-02-18 (×20): 1 via RESPIRATORY_TRACT
  Filled 2022-01-24: qty 28

## 2022-01-24 MED ORDER — VANCOMYCIN HCL IN DEXTROSE 1-5 GM/200ML-% IV SOLN
1000.0000 mg | Freq: Once | INTRAVENOUS | Status: AC
Start: 2022-01-24 — End: 2022-01-24
  Administered 2022-01-24: 1000 mg via INTRAVENOUS
  Filled 2022-01-24: qty 200

## 2022-01-24 MED ORDER — ALUM & MAG HYDROXIDE-SIMETH 200-200-20 MG/5ML PO SUSP
30.0000 mL | ORAL | Status: DC | PRN
Start: 2022-01-24 — End: 2022-02-19
  Administered 2022-01-24: 30 mL
  Filled 2022-01-24 (×2): qty 30

## 2022-01-24 MED ORDER — MIRTAZAPINE 15 MG PO TABS
7.5000 mg | ORAL_TABLET | Freq: Every day | ORAL | Status: DC
  Administered 2022-01-24 – 2022-02-08 (×15): 7.5 mg
  Filled 2022-01-24 (×15): qty 1

## 2022-01-24 MED ORDER — VENLAFAXINE HCL 37.5 MG PO TABS
18.2500 mg | ORAL_TABLET | Freq: Two times a day (BID) | ORAL | Status: DC
  Administered 2022-01-25 – 2022-02-09 (×24): 18.75 mg
  Filled 2022-01-24 (×32): qty 0.5

## 2022-01-24 MED ORDER — OSMOLITE 1.5 CAL PO LIQD
355.0000 mL | Freq: Three times a day (TID) | ORAL | Status: DC
Start: 2022-01-24 — End: 2022-01-25
  Administered 2022-01-25: 355 mL

## 2022-01-24 MED ORDER — OXYCODONE HCL 5 MG PO TABS
5.0000 mg | ORAL_TABLET | ORAL | Status: DC | PRN
  Administered 2022-01-24 – 2022-02-08 (×5): 5 mg
  Filled 2022-01-24 (×6): qty 1

## 2022-01-24 MED ORDER — ACETAMINOPHEN 160 MG/5ML PO SOLN
1000.0000 mg | Freq: Four times a day (QID) | ORAL | Status: DC
  Administered 2022-01-24 – 2022-01-25 (×3): 1000 mg
  Filled 2022-01-24 (×3): qty 40.6

## 2022-01-24 MED ORDER — IOHEXOL 300 MG/ML  SOLN
100.0000 mL | Freq: Once | INTRAMUSCULAR | Status: AC | PRN
  Administered 2022-01-24: 100 mL via INTRAVENOUS

## 2022-01-24 MED ORDER — SODIUM CHLORIDE 0.9 % IV BOLUS
500.0000 mL | Freq: Once | INTRAVENOUS | Status: AC
  Administered 2022-01-24: 500 mL via INTRAVENOUS

## 2022-01-24 MED ORDER — ENSURE ENLIVE PO LIQD
237.0000 mL | Freq: Three times a day (TID) | ORAL | Status: DC
Start: 2022-01-25 — End: 2022-01-25
  Administered 2022-01-25: 237 mL

## 2022-01-24 MED ORDER — VANCOMYCIN HCL IN DEXTROSE 1-5 GM/200ML-% IV SOLN
1000.0000 mg | INTRAVENOUS | Status: DC
  Administered 2022-01-25 – 2022-01-26 (×2): 1000 mg via INTRAVENOUS
  Filled 2022-01-24 (×3): qty 200
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
## 2022-01-24 NOTE — Assessment & Plan Note (Addendum)
Pt has dysphagia and is s/p PEG tube placement on 5/24.  Presents with malodor and purulent discharge from PEG tube site. CT abd/pelvis showed 6.4x2.2 cm area of inflammatory/infectious phlegmon in subcutaneous plaine at site of placement of gastrostomy, pockets of air in the subcutaneous plane at the site of g tube placement (due to open wound in skin or suggest infection with gas producing organisms).  No definite demonstrable thick walled loculated fluid collection. S/p fluoroscopic guided replacement and up sizing of now 24 fr ballon retention gastrostomy tube 6/3 per Dr. Grace Isaac, suspected pressure ulcer about medial aspect of g tube insertion - likely due to insertion angle of tube and external retention disc being too tight  - g tube to gravity initially.  - local wound care (zinc oxide to intact portion of epidermis, dressing changes around g tube 4x daily)  - post pyloric nutrition  - wound care  - available as needed, IR can follow up prn as well Issue at this time is g tube is to gravity and he had cortrak while we're allowing wound to heal.  It's not clear how long he'll need g tube to gravity and post pyloric feeding at this point per general surgery early on during the hospitalization.  -Status post full course Ancef. (he's tolerated cephalosporins and augmentin in past per pharmacy) -Wound cultures  (moderate staph epi, moderate candida albicans - not sure how clinically relevant these are - will follow) -blood cultures negative. -Wound care consulted, appreciate recs - medi honey added -On 01/29/2022 in the evening it was noted per RN and daughter that wound had looked worse and crossed, came and assessed the patient and IV antibiotics broadened back to vancomycin and Merrem. 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  - IV antibiotics narrowed back to Ancef and patient completed a 10-day course of antibiotic treatment. -6/19 patient underwent image guided conversion of G-tube to GJ feeding tube.  Per IR note that T tack mesh conglomerate was sharply excised. -Continue current local wound care. -Per general surgery no need for any systemic antibiotics at this time, general surgery was following. -Tube feeds started dietitian following. -Cortrack discontinued. -Appreciate general surgery's input and recommendations. -Will need outpatient follow-up with general surgery

## 2022-01-24 NOTE — Assessment & Plan Note (Addendum)
Controlled.  Last A1c 5.2% on 5/15. -CBG 170 this morning.   -Follow.

## 2022-01-24 NOTE — Assessment & Plan Note (Addendum)
Presented with hypotension with SBP of 90 over 50s.  This has improved with fluid but remains soft.  Previously on Lasix, Entresto, Aldactone and beta-blockers at home but these were hold during last admission.   -Continue to hold hypertensive medications secondary to low blood pressure. -Saline lock IV fluids.  -repeat echo -> EF 40-45% - BP overall improved, follow

## 2022-01-24 NOTE — ED Notes (Addendum)
This RN called [facility] for purple man, per [facility] they will be putting up purple man shortly.

## 2022-01-24 NOTE — Assessment & Plan Note (Addendum)
Daughter reported persistent diarrhea since the start of tube feed on 5/25.  -Tube feeds changed with some improvement with diarrhea. -Patient with some diarrhea yesterday. Negative GI pathogen panel Negative c diff

## 2022-01-24 NOTE — Assessment & Plan Note (Addendum)
This is from a ground-level fall back in April.  Was on a soft cervical collar and continue follow-up with neurosurgery. Discussed with Dr. Ellene Route, he notes ok to d/c c collar Flex ex films  with known subacute fx through anterior inferior aspect of c 6 vertebral body, anterior osteophyte not well seen, C6-7 space normally aligned without abnormal motion on extension, not well assessed on flexion (see report) -Patient seen by neurosurgery who states C6 fracture is healed and patient can be removed from collar and pursue activities as tolerated.

## 2022-01-24 NOTE — Assessment & Plan Note (Addendum)
-  Rate controlled on amiodarone.   -Eliquis held due to ongoing hematuria. -Urology recommending Eliquis may be resumed once urine has remained clear for greater than 2 days.  -Urine now clear for 48 hours and as such we will resume Eliquis.

## 2022-01-24 NOTE — Assessment & Plan Note (Addendum)
Appears euvolemic on exam. Echo as above, -Prior to admission patient noted to be on Lasix, Entresto, Aldactone and beta-blockers which are currently on hold due to soft blood pressure.

## 2022-01-24 NOTE — Assessment & Plan Note (Addendum)
S/p PEG tube by VIR on 5/24, s/p replacement as above -Dietitian consulted for tube feeding  -The afternoon of 01/31/2022, concerned that tube feeds coming out of PEG tube site and concern for malposition of cortrack. -Abdominal films obtained and feeding tube tip noted at the position of the pylorus directed towards the duodenum. -Advanced cortrack 4 cm, 02/01/2022 with repeat abdominal films showing appropriate placement of cortrack tube.  -Resumed tube feeds. -IV fluids have been discontinued. -6/19 patient underwent image guided conversion of G-tube to Malo feeding tube. -Tube feeds resumed with dietitian following. -Cortrack discontinued -Dietitian following and appreciate their input and recommendations.

## 2022-01-24 NOTE — ED Provider Triage Note (Signed)
Emergency Medicine Provider Triage Evaluation Note  [name] , a 88 y.o. male  was evaluated after arrival to the emergency department.  Patient with recent hospital admission, discharged to home with home health.  He had PEG tube placement during his hospitalization.  Per EMS report, sent in by family due to concern for infection around the PEG tube.  Gray drainage noted.  Patient has a history of dementia and is full code.  Patient is moaning, states his chest is hurting, very difficult to understand.   Review of Systems  Positive: Drainage from PEG tube site. Negative:   Physical Exam  BP (!) 91/56   Pulse 88   Temp 98.4 F (36.9 C) (Oral)   SpO2 92%  Gen:   Awake, history of dementia, moaning Resp:  Normal effort  MSK:   Moves extremities without difficulty  Other:  I took down the dressing from the patient's PEG tube.  There is a small amount of yellow-gray drainage around the exit of the PEG with somewhat foul odor.  Medical Decision Making  Medically screening exam initiated at 2:29 PM.  Appropriate orders placed.  [name] was informed that the remainder of the evaluation will be completed by another provider, this initial triage assessment does not replace that evaluation, and the importance of remaining in the ED until their evaluation is complete.  CBC, CMP, lactate ordered.  Will obtain portable chest x-ray and defer additional imaging to oncoming provider.   Carlisle Cater, PA-C 01/24/22 1432

## 2022-01-24 NOTE — Assessment & Plan Note (Signed)
Stable and asymptomatic from cardiac standpoint.

## 2022-01-24 NOTE — Assessment & Plan Note (Addendum)
Foley catheter placed on 5/27 due to urinary retention at recent admission.  He has planned outpatient follow-up with urology. -Patient underwent successful voiding trial.  -Urine output 1100 cc over the past 24 hours. -Patient with good urine output post Foley catheter removal with hematuria noted.  -Continue doxazosin. -Monitor urine output. -Urology following.

## 2022-01-24 NOTE — Assessment & Plan Note (Addendum)
Mildly elevated -LFTs trending down. -Follow-up.

## 2022-01-24 NOTE — Progress Notes (Addendum)
Pharmacy Antibiotic Note  [name] is a 88 y.o. male admitted on 01/24/2022 with cellulitis.  Pharmacy has been consulted for vanc dosing.  Pt was dc from here a few days ago. He is here again with concern of purulent discharge from PEG tube site. Plan to use vanc empirically for cellulitis.   Scr 1.09 Crcl~52 ml/min  Addendum  Adding Merrem for GNR coverage since he has an unspecified PCN allergy listed after discussion with Dr Flossie Buffy.   Plan: Vanc 1g IV q24 (VT 10-15) x7 days Level as needed Merrem 1g IV q8    Temp (24hrs), Avg:99.1 F (37.3 C), Min:98.4 F (36.9 C), Max:99.9 F (37.7 C)  Recent Labs  Lab 01/18/22 0323 01/19/22 0226 01/20/22 0305 01/21/22 0604 01/24/22 1524 01/24/22 1830  WBC 7.3 8.0 8.8 8.5 6.4  --   CREATININE 1.12 1.12  --  1.00 1.09  --   LATICACIDVEN  --   --   --   --  2.4* 2.5*    Estimated Creatinine Clearance: 51.8 mL/min (by C-G formula based on SCr of 1.09 mg/dL).    Allergies  Allergen Reactions   Penicillins Other (See Comments)    Unknown   Tramadol Other (See Comments)    Dizzy   Ketorolac Tromethamine Rash    Antimicrobials this admission: 6/2 vanc>>6/8 6/2 Merrem>> Dose adjustments this admission:   Microbiology results: 5/26 UC - neg 6/2 blood>> 6/2 urine>>  Onnie Boer, PharmD, [name], AAHIVP, CPP Infectious Disease Pharmacist 01/24/2022 8:40 PM

## 2022-01-24 NOTE — ED Provider Notes (Signed)
[facility] [facility] EMERGENCY DEPARTMENT Provider Note   CSN: [id number] Arrival date & time: 01/24/22  1402     History  Chief Complaint  Patient presents with   feeding tube issue     [name] is a 88 y.o. male.  The history is provided by the patient, a relative and medical records. The history is limited by the condition of the patient. No language interpreter was used.  Abdominal Pain Pain location:  Generalized Pain radiates to:  Does not radiate Pain severity:  Severe Onset quality:  Unable to specify Timing:  Unable to specify Progression:  Worsening Chronicity:  New Context: previous surgery and recent illness   Relieved by:  Nothing Worsened by:  Palpation and movement Ineffective treatments:  None tried Associated symptoms: diarrhea, nausea and vomiting   Associated symptoms: no chest pain, no chills, no constipation, no cough, no fatigue, no fever and no shortness of breath       Home Medications Prior to Admission medications   Medication Sig Start Date End Date Taking? Authorizing Provider  acetaminophen (TYLENOL) 160 MG/5ML solution Place 31.3 mLs (1,000 mg total) into feeding tube every 6 (six) hours. 01/21/22   Ezekiel Slocumb, DO  albuterol (PROVENTIL HFA;VENTOLIN HFA) 108 (90 Base) MCG/ACT inhaler Inhale 2 puffs into the lungs every 6 (six) hours as needed for wheezing or shortness of breath. 02/12/18   [name] [name], [name]  alum & mag hydroxide-simeth (MAALOX/MYLANTA) 200-200-20 MG/5ML suspension Place 30 mLs into feeding tube every 4 (four) hours as needed for indigestion, heartburn or flatulence. 01/21/22   Ezekiel Slocumb, DO  amiodarone (PACERONE) 200 MG tablet Place 1 tablet (200 mg total) into feeding tube daily. 01/22/22   Ezekiel Slocumb, DO  apixaban (ELIQUIS) 5 MG TABS tablet Place 1 tablet (5 mg total) into feeding tube 2 (two) times daily. 01/21/22   Ezekiel Slocumb, DO  azelastine (ASTELIN) 0.1 % nasal spray Place 1  spray into both nostrils 2 (two) times daily. 10/15/21   [name], DO  bisacodyl (DULCOLAX) 10 MG suppository Place 1 suppository (10 mg total) rectally daily as needed for moderate constipation. 12/17/21   Winferd Humphrey, PA-C  budesonide-formoterol (SYMBICORT) 160-4.5 MCG/ACT inhaler Inhale 2 puffs into the lungs 2 (two) times daily. 03/22/19   [name], [name], [name]  diphenoxylate-atropine (LOMOTIL) 2.5-0.025 MG tablet Place 1 tablet into feeding tube 4 (four) times daily as needed for diarrhea or loose stools. 01/21/22 01/21/23  Ezekiel Slocumb, DO  docusate sodium (COLACE) 100 MG capsule Take 1 capsule (100 mg total) by mouth 2 (two) times daily. 12/17/21   Winferd Humphrey, PA-C  famotidine (PEPCID) 20 MG tablet Place 1 tablet (20 mg total) into feeding tube 2 (two) times daily. 01/21/22 03/22/22  Nicole Kindred A, DO  feeding supplement (ENSURE ENLIVE / ENSURE PLUS) LIQD Take 237 mLs by mouth 3 (three) times daily between meals. 01/21/22   Ezekiel Slocumb, DO  fluticasone (FLONASE) 50 MCG/ACT nasal spray Place 2 sprays into both nostrils daily as needed for allergies or rhinitis.    [provider]  Lancets Glory Rosebush ULTRASOFT) lancets Use to check blood sugars daily 10/16/17   [name], [name], [name]  lidocaine (LIDODERM) 5 % Place 1 patch onto the skin daily. Remove & Discard patch within 12 hours or as directed by MD    [provider]  meclizine (ANTIVERT) 25 MG tablet Take 1 tablet (25 mg total) by mouth 3 (three) times  daily as needed for dizziness. 05/13/21   Sherwood Gambler, MD  metFORMIN (GLUCOPHAGE) 500 MG tablet Take 1 tablet (500 mg total) by mouth daily with breakfast. 10/15/21   Ronnie Doss M, DO  mirtazapine (REMERON) 7.5 MG tablet Place 1 tablet (7.5 mg total) into feeding tube at bedtime. 01/21/22   Ezekiel Slocumb, DO  Nutritional Supplements (FEEDING SUPPLEMENT, OSMOLITE 1.5 CAL,) LIQD Place 355 mLs into feeding tube 4 (four) times daily.  01/21/22   Ezekiel Slocumb, DO  nystatin (MYCOSTATIN) 100000 UNIT/ML suspension Take 5 mLs (500,000 Units total) by mouth 4 (four) times daily. 01/21/22   Ezekiel Slocumb, DO  ondansetron (ZOFRAN-ODT) 8 MG disintegrating tablet Take 1 tablet (8 mg total) by mouth every 8 (eight) hours as needed for nausea or vomiting. 01/21/22   Ezekiel Slocumb, DO  ONETOUCH VERIO test strip TEST BLOOD SUGARS DAILY DX E11.9 07/22/21   Ronnie Doss M, DO  oxyCODONE (OXY IR/ROXICODONE) 5 MG immediate release tablet Place 1 tablet (5 mg total) into feeding tube every 4 (four) hours as needed for severe pain. 01/21/22   Ezekiel Slocumb, DO  rosuvastatin (CRESTOR) 40 MG tablet Place 1 tablet (40 mg total) into feeding tube at bedtime. 01/21/22   Ezekiel Slocumb, DO  senna-docusate (SENOKOT-S) 8.6-50 MG tablet Place 1 tablet into feeding tube at bedtime as needed for mild constipation. 01/21/22   Ezekiel Slocumb, DO  venlafaxine (EFFEXOR) 37.5 MG tablet Place 0.5 tablets (18.75 mg total) into feeding tube 2 (two) times daily with a meal. 01/21/22   Nicole Kindred A, DO      Allergies    Penicillins, Tramadol, and Ketorolac tromethamine    Review of Systems   Review of Systems  Constitutional:  Negative for chills, fatigue and fever.  HENT:  Negative for congestion.   Respiratory:  Negative for cough, chest tightness, shortness of breath and wheezing.   Cardiovascular:  Negative for chest pain.  Gastrointestinal:  Positive for abdominal distention, abdominal pain, diarrhea, nausea and vomiting. Negative for constipation.  Genitourinary:  Positive for decreased urine volume (urine darker and cloudy).  Musculoskeletal:  Negative for back pain.  Neurological:  Negative for headaches.       Fam not reporting headaches   Psychiatric/Behavioral:  Positive for confusion. Negative for agitation.   All other systems reviewed and are negative.  Physical Exam Updated Vital Signs BP (!) 91/56   Pulse 88    Temp 98.4 F (36.9 C) (Oral)   SpO2 92%  Physical Exam Vitals and nursing note reviewed.  Constitutional:      General: He is not in acute distress.    Appearance: He is well-developed. He is ill-appearing. He is not toxic-appearing or diaphoretic.  HENT:     Head: Normocephalic and atraumatic.     Nose: No congestion.     Mouth/Throat:     Mouth: Mucous membranes are moist.     Pharynx: No oropharyngeal exudate or posterior oropharyngeal erythema.  Eyes:     Extraocular Movements: Extraocular movements intact.     Conjunctiva/sclera: Conjunctivae normal.     Pupils: Pupils are equal, round, and reactive to light.  Cardiovascular:     Rate and Rhythm: Normal rate and regular rhythm.     Heart sounds: No murmur heard. Pulmonary:     Effort: Pulmonary effort is normal. No respiratory distress.     Breath sounds: Normal breath sounds. No wheezing, rhonchi or rales.  Chest:  Chest wall: No tenderness.  Abdominal:     General: Bowel sounds are decreased. There is distension.     Tenderness: There is generalized abdominal tenderness. There is no right CVA tenderness, left CVA tenderness, guarding or rebound.     Comments: Tenderness around G-tube site with some purulent appearance and erythema.  Some drainage seen.  G-tube is reportedly working well.  Musculoskeletal:        General: No swelling or tenderness.     Cervical back: Neck supple.  Skin:    General: Skin is warm and dry.     Capillary Refill: Capillary refill takes less than 2 seconds.     Findings: Erythema present.  Neurological:     Mental Status: He is alert.  Psychiatric:        Mood and Affect: Mood normal.    ED Results / Procedures / Treatments   Labs (all labs ordered are listed, but only abnormal results are displayed) Labs Reviewed  CBC WITH DIFFERENTIAL/PLATELET - Abnormal; Notable for the following components:      Result Value   RBC 2.51 (*)    Hemoglobin 7.7 (*)    HCT 25.8 (*)    MCV 102.8  (*)    MCHC 29.8 (*)    RDW 18.9 (*)    Platelets 115 (*)    All other components within normal limits  COMPREHENSIVE METABOLIC PANEL - Abnormal; Notable for the following components:   Glucose, Bld 123 (*)    BUN 43 (*)    Total Protein 5.1 (*)    Albumin 1.8 (*)    AST 154 (*)    ALT 124 (*)    Alkaline Phosphatase 142 (*)    All other components within normal limits  LACTIC ACID, PLASMA - Abnormal; Notable for the following components:   Lactic Acid, Venous 2.4 (*)    All other components within normal limits  LACTIC ACID, PLASMA - Abnormal; Notable for the following components:   Lactic Acid, Venous 2.5 (*)    All other components within normal limits  LIPASE, BLOOD - Abnormal; Notable for the following components:   Lipase 70 (*)    All other components within normal limits  URINALYSIS, ROUTINE W REFLEX MICROSCOPIC - Abnormal; Notable for the following components:   Color, Urine AMBER (*)    APPearance CLOUDY (*)    Hgb urine dipstick LARGE (*)    Protein, ur 100 (*)    Leukocytes,Ua TRACE (*)    RBC / HPF >50 (*)    Bacteria, UA MANY (*)    All other components within normal limits  URINE CULTURE  CULTURE, BLOOD (ROUTINE X 2)  CULTURE, BLOOD (ROUTINE X 2)    EKG EKG Interpretation  Date/Time:  Friday January 24 2022 14:10:50 EDT Ventricular Rate:  87 PR Interval:  169 QRS Duration: 223 QT Interval:  469 QTC Calculation: 565 R Axis:   -70 Text Interpretation: Sinus or ectopic atrial rhythm IVCD, consider atypical RBBB LVH with secondary repolarization abnormality paced rhythm similar to prior. No STEMI Confirmed by Antony Blackbird [phone]) on 01/24/2022 3:22:09 PM  Radiology CT ABDOMEN PELVIS W CONTRAST  Result Date: 01/24/2022 CLINICAL DATA:  Abdominal pain and distention purulent discharge at the site of gastrostomy placement EXAM: CT ABDOMEN AND PELVIS WITH CONTRAST TECHNIQUE: Multidetector CT imaging of the abdomen and pelvis was performed using the standard  protocol following bolus administration of intravenous contrast. RADIATION DOSE REDUCTION: This exam was performed according to the departmental  dose-optimization program which includes automated exposure control, adjustment of the mA and/or kV according to patient size and/or use of iterative reconstruction technique. CONTRAST:  168m OMNIPAQUE IOHEXOL 300 MG/ML  SOLN COMPARISON:  12/11/2021 FINDINGS: Lower chest: Heart is enlarged in size. Coronary artery calcifications are seen. There are metallic sutures in the sternum. Pacemaker leads are noted in the heart. Hepatobiliary: No focal abnormality is seen in the liver. There is slight prominence of intrahepatic bile ducts. Surgical clips are seen in gallbladder fossa. There is no significant dilation of extrahepatic bile ducts. Pancreas: No focal abnormality is seen. Spleen: Spleen measures 15.9 cm in maximum diameter. Adrenals/Urinary Tract: Adrenals are unremarkable. There is no hydronephrosis. Bilateral renal cysts are seen. Largest of the cysts is seen in the anterior upper pole of right kidney measuring 7.1 cm. Some of the low-density lesions are subcentimeter in size limiting characterization. There are no renal or ureteral stones. Foley catheter is seen in the bladder. There is subtle increased density in the dependent portion of urinary bladder. Stomach/Bowel: Small hiatal hernia is seen. Stomach is not distended. There is interval placement of gastrostomy. Balloon in the gastrostomy tube is noted projecting in the anterior margin of the gastric wall. There is mild stranding in the perigastric region at the site of gastrostomy. There is 6.4 x 2.2 cm area of inflammatory phlegmon in the subcutaneous plane at the site of gastrostomy placement. There are small pockets of air in the subcutaneous plane at the site of gastrostomy. This may be due to open wound in the skin or suggest infectious process in the subcutaneous plane. There is no definite demonstrable  thick-walled fluid collection. Small bowel loops are not dilated. Appendix is not dilated. There is no significant wall thickening in colon. Vascular/Lymphatic: There are scattered arterial calcifications. There is seen low-density in the infrarenal abdominal aorta, residual change from aortobifemoral bypass graft. The stent appears to be patent. There is ectasia of left common iliac artery measuring 2.1 cm in the AP diameter. Reproductiveprostate is not enlarged. Please correlate for possible previous intervention in the prostate. Other: There is no ascites or pneumoperitoneum. There is ventral hernia containing fat to the left of midline above the level of umbilicus. Bilateral inguinal hernias containing fat are seen, larger on the right side. Musculoskeletal: Degenerative changes are noted in the lumbar spine. Healing fractures are seen in the right ala of sacrum posterior right iliac bone close to the SI joint right superior pubic ramus and right inferior pubic ramus. Mixed attenuation seen in the body of T12 vertebra as not changed. IMPRESSION: There is 6.4 x 2.2 cm area of inflammatory/infectious phlegmon in the subcutaneous plane at the site of placement of gastrostomy. There are pockets of air in the subcutaneous plane at the site gastrostomy tube placement. This may be due to open wound in the skin or suggest infection with gas producing organisms. There is no definite demonstrable thick-walled loculated fluid collection at the site of gastrostomy tube placement. Balloon in the tip of gastrostomy tube is projecting immediately anterior to the anterior wall of the stomach. Possibility of inflated balloon in the gastrostomy tube to be lying immediately anterior to the gastric wall is not excluded. There is no evidence of pneumoperitoneum or significant ascites. There is no evidence of intestinal obstruction or pneumoperitoneum. Appendix is not dilated. There is no hydronephrosis. Appendix is not dilated.  Small hiatal hernia. Coronary artery disease. Bilateral renal cysts. Healing fractures are seen in the right ala of sacrum, right  iliac bone, right superior pubic ramus and right inferior pubic ramus. Other findings as described in the body of the report. Electronically Signed   By: Elmer Picker M.D.   On: 01/24/2022 17:26   DG Chest Portable 1 View  Result Date: 01/24/2022 CLINICAL DATA:  chest pain, drainage around peg site EXAM: PORTABLE CHEST 1 VIEW COMPARISON:  Chest radiograph Jan 06, 2022. FINDINGS: Low lung volumes. No consolidation. No visible pleural effusions or pneumothorax. Left subclavian approach cardiac rhythm maintenance device in similar position. CABG and median sternotomy. Similar mild enlargement the cardiac silhouette. No displaced fracture identified. IMPRESSION: No evidence of acute cardiopulmonary disease. Electronically Signed   By: [name] M.D.   On: 01/24/2022 14:49    Procedures Procedures    CRITICAL CARE Performed by: Gwenyth Allegra Keiland Pickering Total critical care time: 40 minutes Critical care time was exclusive of separately billable procedures and treating other patients. Critical care was necessary to treat or prevent imminent or life-threatening deterioration. Critical care was time spent personally by me on the following activities: development of treatment plan with patient and/or surrogate as well as nursing, discussions with consultants, evaluation of patient's response to treatment, examination of patient, obtaining history from patient or surrogate, ordering and performing treatments and interventions, ordering and review of laboratory studies, ordering and review of radiographic studies, pulse oximetry and re-evaluation of patient's condition.    Medications Ordered in ED Medications  vancomycin (VANCOCIN) IVPB 1000 mg/200 mL premix (1,000 mg Intravenous New Bag/Given 01/24/22 1837)  sodium chloride 0.9 % bolus 500 mL (0 mLs Intravenous  Stopped 01/24/22 1713)  iohexol (OMNIPAQUE) 300 MG/ML solution 100 mL (100 mLs Intravenous Contrast Given 01/24/22 1646)  sodium chloride 0.9 % bolus 1,000 mL (1,000 mLs Intravenous New Bag/Given 01/24/22 1837)    ED Course/ Medical Decision Making/ A&P                           Medical Decision Making Amount and/or Complexity of Data Reviewed Labs: ordered. Radiology: ordered.  Risk Prescription drug management. Decision regarding hospitalization.    TURKI TAPANES is a 88 y.o. male with a complex past medical history including CAD status post CABG, COPD, CHF, GERD, hypertension, dyslipidemia, documentation of previous AAA, kidney stones, diabetes, previous prostate cancer, paroxysmal atrial fibrillation on Eliquis therapy, sick sinus syndrome status post pacemaker, recent trauma with cervical fractures, dysphagia with recent placement of a G-tube on 01/11/2022 (10 days ago) and recent admission for suspected UTI with encephalopathy who presents with worsened abdominal pain, distention, intermittent episodes of nausea and vomiting, erythema around the feeding tube site, and a purulent foul-smelling drainage around the site.  Patient is accompanied by daughter who provides most of the information.  She reports that the patient's mental status has declined over the last 24 hours and he is acting slightly more confused.  He is also intermittently screaming in pain.  He has had loose stools but she denies any bloody bowel movements.  He has not had any documented fevers or chills at home and she is concerned his urine looks somewhat infected.  He has a Foley in place.  She reports she has not been having significant coughing but she noticed over the last few days worsening redness around the G-tube site and then today noticed a foul-smelling purulence around the site.  She is concerned about infection.  On exam, patient does not have his teeth and and is difficult to understand.  Family reports he is  slightly more confused than baseline.  No reported new traumas.  Patient does have significant tenderness around his feeding tube site.  The daughter showed some pictures and there does appear to be a purulent area around the site with some drainage.  Bowel sounds were diminished on my exam.  Chest was nontender.  Lungs were clear.  Patient did appear to move extremities.  Pupils are symmetric and reactive.  Given the patient's recent G-tube placement with reported redness, purulence, tenderness, and distention, I am concerned about some complications such as obstruction, abscess, or other abnormality.  Family reports that the G-tube is still flushing well.  Given the report of recent UTI and the sediment and darkened urine I am concerned that recurrent UTIs will contribute to some of the mental status changes.  We will get x-ray and labs.  Blood pressure is in the low 90s, will give a small amount of fluids given his history of heart failure will be gentle with fluids.  He is afebrile but will check rectal temp.  We will get CT scan with contrast if possible but will switch to noncontrast scan if he cannot tolerate it with kidney function.  Anticipate reassessment and likely admission after work-up is completed.   5:57 PM CT scan returned showing no evidence of abscess but does show infectious phlegmon in the subcutaneous area with some subcutaneous gas near the G-tube site.  It also shows the G-tube balloon near the tip of the anterior stomach.  We will call interventional radiology as they are the ones who placed it last week.  With this concern for infection, his labs also suggested with a lactic acidosis and his intermittently soft pressures in the 90s now.  We will give some more fluids, antibiotics, and will call IR.  Anticipate admission as well.  IR reports that they will see the patient during the admission but request admission to medicine.  Patient will get antibiotics and will be admitted for  further management.  Family agrees with plan.        Final Clinical Impression(s) / ED Diagnoses Final diagnoses:  Cellulitis of abdominal wall    Clinical Impression: 1. Cellulitis of abdominal wall     Disposition: Admit  This note was prepared with assistance of Dragon voice recognition software. Occasional wrong-word or sound-a-like substitutions may have occurred due to the inherent limitations of voice recognition software.       Garrick Midgley, Gwenyth Allegra, MD 01/24/22 Lurena Nida

## 2022-01-24 NOTE — ED Triage Notes (Signed)
Pt BIB [facility] d/t possible infection from the feeding tube insertion site. Per Family, pt was discharged here about 3 days ago after a bed-bound placement. They seen gray discharge from the insertion site. Pt has a pacemaker. Confused at baseline. Afebrile. No signs of sepsis. VSS.

## 2022-01-25 ENCOUNTER — Inpatient Hospital Stay (HOSPITAL_COMMUNITY): Payer: Medicare Other

## 2022-01-25 DIAGNOSIS — L8990 Pressure ulcer of unspecified site, unspecified stage: Secondary | ICD-10-CM

## 2022-01-25 DIAGNOSIS — L03818 Cellulitis of other sites: Secondary | ICD-10-CM | POA: Diagnosis not present

## 2022-01-25 DIAGNOSIS — D649 Anemia, unspecified: Secondary | ICD-10-CM

## 2022-01-25 HISTORY — PX: IR REPLC GASTRO/COLONIC TUBE PERCUT W/FLUORO: IMG2333

## 2022-01-25 LAB — GASTROINTESTINAL PANEL BY PCR, STOOL (REPLACES STOOL CULTURE)

## 2022-01-25 LAB — COMPREHENSIVE METABOLIC PANEL
ALT: 107 U/L — ABNORMAL HIGH (ref 0–44)
AST: 104 U/L — ABNORMAL HIGH (ref 15–41)
Albumin: 1.8 g/dL — ABNORMAL LOW (ref 3.5–5.0)
Alkaline Phosphatase: 133 U/L — ABNORMAL HIGH (ref 38–126)
Anion gap: 6 (ref 5–15)
BUN: 39 mg/dL — ABNORMAL HIGH (ref 8–23)
CO2: 25 mmol/L (ref 22–32)
Calcium: 9.5 mg/dL (ref 8.9–10.3)
Chloride: 108 mmol/L (ref 98–111)
Creatinine, Ser: 1.09 mg/dL (ref 0.61–1.24)
GFR, Estimated: >60 mL/min (ref >60)
Glucose, Bld: 118 mg/dL — ABNORMAL HIGH (ref 70–99)
Potassium: 4.3 mmol/L (ref 3.5–5.1)
Sodium: 139 mmol/L (ref 135–145)
Total Bilirubin: 0.5 mg/dL (ref 0.3–1.2)
Total Protein: 4.8 g/dL — ABNORMAL LOW (ref 6.5–8.1)

## 2022-01-25 LAB — HEMOGLOBIN AND HEMATOCRIT, BLOOD
HCT: 25.1 % — ABNORMAL LOW (ref 39.0–52.0)
Hemoglobin: 7.6 g/dL — ABNORMAL LOW (ref 13.0–17.0)

## 2022-01-25 LAB — TYPE AND SCREEN
ABO/RH(D): A POS
Antibody Screen: NEGATIVE

## 2022-01-25 LAB — CBC
HCT: 23.1 % — ABNORMAL LOW (ref 39.0–52.0)
Hemoglobin: 7.1 g/dL — ABNORMAL LOW (ref 13.0–17.0)
MCH: 30.9 pg (ref 26.0–34.0)
MCHC: 30.7 g/dL (ref 30.0–36.0)
MCV: 100.4 fL — ABNORMAL HIGH (ref 80.0–100.0)
Platelets: 107 10*3/uL — ABNORMAL LOW (ref 150–400)
RBC: 2.3 MIL/uL — ABNORMAL LOW (ref 4.22–5.81)
RDW: 18.8 % — ABNORMAL HIGH (ref 11.5–15.5)
WBC: 6.2 10*3/uL (ref 4.0–10.5)
nRBC: 0 % (ref 0.0–0.2)

## 2022-01-25 LAB — GLUCOSE, CAPILLARY: Glucose-Capillary: 133 mg/dL — ABNORMAL HIGH (ref 70–99)

## 2022-01-25 LAB — LACTIC ACID, PLASMA: Lactic Acid, Venous: 2.3 mmol/L (ref 0.5–1.9)

## 2022-01-25 MED ORDER — KATE FARMS STANDARD 1.4 PO LIQD
325.0000 mL | Freq: Every day | ORAL | Status: DC
Start: 2022-01-25 — End: 2022-01-28
  Administered 2022-01-25 – 2022-01-28 (×4): 325 mL
  Filled 2022-01-25 (×15): qty 325

## 2022-01-25 MED ORDER — IOHEXOL 300 MG/ML  SOLN
100.0000 mL | Freq: Once | INTRAMUSCULAR | Status: AC | PRN
Start: 2022-01-25 — End: 2022-01-25
  Administered 2022-01-25: 15 mL

## 2022-01-25 MED ORDER — FLUCONAZOLE 40 MG/ML PO SUSR
200.0000 mg | Freq: Once | ORAL | Status: DC
  Filled 2022-01-25: qty 5

## 2022-01-25 MED ORDER — LIDOCAINE VISCOUS HCL 2 % MT SOLN
OROMUCOSAL | Status: AC
  Filled 2022-01-25: qty 15

## 2022-01-25 MED ORDER — CHLORHEXIDINE GLUCONATE CLOTH 2 % EX PADS
6.0000 | MEDICATED_PAD | Freq: Every day | CUTANEOUS | Status: DC
  Administered 2022-01-25 – 2022-02-19 (×24): 6 via TOPICAL

## 2022-01-25 MED ORDER — FREE WATER
120.0000 mL | Freq: Every day | Status: DC
  Administered 2022-01-25 – 2022-01-28 (×6): 120 mL

## 2022-01-25 MED ORDER — JUVEN PO PACK
1.0000 | PACK | Freq: Two times a day (BID) | ORAL | Status: DC
  Administered 2022-01-28 – 2022-02-19 (×42): 1
  Filled 2022-01-25: qty 1
  Filled 2022-01-25: qty 2
  Filled 2022-01-25 (×8): qty 1
  Filled 2022-01-25: qty 2
  Filled 2022-01-25 (×17): qty 1
  Filled 2022-01-25: qty 2
  Filled 2022-01-25 (×18): qty 1

## 2022-01-25 MED ORDER — ACETAMINOPHEN 160 MG/5ML PO SOLN
1000.0000 mg | Freq: Three times a day (TID) | ORAL | Status: DC
  Administered 2022-01-25 – 2022-02-19 (×65): 1000 mg
  Filled 2022-01-25 (×67): qty 40.6

## 2022-01-25 MED ORDER — FLUCONAZOLE 40 MG/ML PO SUSR
100.0000 mg | Freq: Every day | ORAL | Status: DC
  Filled 2022-01-25: qty 2.5

## 2022-01-25 NOTE — Assessment & Plan Note (Addendum)
Pressure Injury 01/24/22 Buttocks Deep Tissue Pressure Injury - Purple or maroon localized area of discolored intact skin or blood-filled blister due to damage of underlying soft tissue from pressure and/or shear. (Active)  01/24/22 2036  Location: Buttocks  Location Orientation:   Staging: Deep Tissue Pressure Injury - Purple or maroon localized area of discolored intact skin or blood-filled blister due to damage of underlying soft tissue from pressure and/or shear.  Wound Description (Comments):   Present on Admission: Yes   Wound c/s

## 2022-01-25 NOTE — Assessment & Plan Note (Addendum)
  Anemia labs c/w aocd, iron def -Patient with hematuria which has been ongoing. -Status post transfusion 1 unit packed red blood cells 02/09/2022. -Hemoglobin stable at 8.2. -Transfusion threshold hemoglobin < 7.

## 2022-01-25 NOTE — Progress Notes (Signed)
PROGRESS NOTE    [name]  JQZ:[id number] DOB: 1933/04/07 DOA: 01/24/2022 PCP: [name], DO  Chief Complaint  Patient presents with   feeding tube issue     Brief Narrative:  [name] is [name] 88 y.o. male with medical history significant of paroxysmal atrial fibrillation, chronic systolic CHF, CAD s/p CABG, sick sinus syndrome s/p pacemaker, COPD, type 2 diabetes, hypertension, hyperlipidemia, chronic thrombocytopenia, recurrent UTI, OSA not on CPAP, dysphagia with PEG tube who presents with concerns of purulent discharge from feeding tube.    Assessment & Plan:   Principal Problem:   Cellulitis Active Problems:   Hypotension   Paroxysmal atrial fibrillation (HCC)   HFrEF (heart failure with reduced ejection fraction) (HCC)   C6 cervical fracture (HCC)   Controlled type 2 diabetes mellitus without complication, without long-term current use of insulin (HCC)   S/P CABG x 3   Dysphagia   Anemia   Urinary retention   Diarrhea   Abnormal LFTs   Pressure ulcer   Status post insertion of percutaneous endoscopic gastrostomy (PEG) tube ([place])   Assessment and Plan: * Cellulitis Pt has dysphagia and is s/p PEG tube placement on 5/24.  Presents with malodor and purulent discharge from PEG tube site. CT abd/pelvis showed 6.4x2.2 cm area of inflammatory/infectious phlegmon in subcutaneous plaine at site of placement of gastrostomy, pockets of air in the subcutaneous plane at the site of g tube placement (due to open wound in skin or suggest infection with gas producing organisms).  No definite demonstrable thick walled loculated fluid collection. S/p fluoroscopic guided replacement and up sizing of now 24 fr ballon retention gastrostomy tube (per Dr. Pascal Lux, suspected pressure ulcer about medial aspect of g tube insertion - likely due to insertion angle of tube and external retention disc being too tight) -continue with IV Vancomycin and add IV meropenem due to PCN allergy  for now -Wound cultures are pending (few gram positive cocci) -blood cultures pending -Wound care consulted  Hypotension Presented with hypotension with SBP of 90 over 50s.  This has improved with fluid but remains soft.  Previously on Lasix, Entresto, Aldactone and beta-blockers at home but these were hold during last admission.  We will continue to hold now due to soft blood pressure -Continuous low rate IV fluid overnight  -repeat echo -consider adding midodrine?  C6 cervical fracture ([place]) This is from [facility] ground-level fall back in April.  Continue soft cervical collar and continue follow-up with neurosurgery.  HFrEF (heart failure with reduced ejection fraction) ([place]) Appears euvolemic on exam.  Continue to monitor fluid status with IV continuous fluid. Echo as above, caution with IVF  Paroxysmal atrial fibrillation (HCC) Continue Eliquis  Controlled type 2 diabetes mellitus without complication, without long-term current use of insulin (HCC) Controlled.  Last A1c 5.2% on 5/15.  Hold on sliding scale insulin for now.  Monitor blood glucose with morning labs.  S/P CABG x 3 Stable and asymptomatic from cardiac standpoint.  Anemia Trend Anemia labs  Dysphagia S/p PEG tube by VIR on 5/24, replaced today as above -Dietitian consulted for tube feeding -Continuous low-dose IV fluid overnight  Urinary retention Foley catheter placed on 5/27 due to urinary retention at recent admission.  He has planned outpatient follow-up with urology. May consider trial of void while he's here, will review  Diarrhea Daughter reports persistent diarrhea since the start of tube feed on 5/25.  Negative GI pathogen panel Negative c diff Follow, adjusting feeds per dietitian  Abnormal LFTs Mildly elevated Will follow  Pressure ulcer Pressure Injury 01/24/22 Buttocks Deep Tissue Pressure Injury - Purple or maroon localized area of discolored intact skin or blood-filled blister due to damage  of underlying soft tissue from pressure and/or shear. (Active)  01/24/22 2036  Location: Buttocks  Location Orientation:   Staging: Deep Tissue Pressure Injury - Purple or maroon localized area of discolored intact skin or blood-filled blister due to damage of underlying soft tissue from pressure and/or shear.  Wound Description (Comments):   Present on Admission: Yes   Wound c/s        DVT prophylaxis: eliquis Code Status: full Family Communication: daughter, wife Disposition:   Status is: Inpatient Remains inpatient appropriate because: need for further abx, wound care   Consultants:  IR  Procedures:  Successful fluoroscopic guided replacement and up sizing of now 24 Fr balloon retention gastrostomy tube.    Antimicrobials:  Anti-infectives (From admission, onward)    Start     Dose/Rate Route Frequency Ordered Stop   01/25/22 1800  vancomycin (VANCOCIN) IVPB 1000 mg/200 mL premix        1,000 mg 200 mL/hr over 60 Minutes Intravenous Every 24 hours 01/24/22 2041     01/24/22 2230  meropenem (MERREM) 1 g in sodium chloride 0.9 % 100 mL IVPB        1 g 200 mL/hr over 30 Minutes Intravenous Every 8 hours 01/24/22 2134     01/24/22 1800  vancomycin (VANCOCIN) IVPB 1000 mg/200 mL premix        1,000 mg 200 mL/hr over 60 Minutes Intravenous  Once 01/24/22 1757 01/24/22 1952       Subjective: Sleeping Daughter at bedside  Objective: Vitals:   01/25/22 0829 01/25/22 0849 01/25/22 1314 01/25/22 1320  BP: (!) 104/48  (!) 99/53   Pulse: 66  68   Resp: 18  18   Temp: (!) 97.4 F (36.3 C)  97.6 F (36.4 C)   TempSrc: Axillary  Oral   SpO2: 99% 96% 91%   Weight:      Height:    '5\' 11"'$  (1.803 m)    Intake/Output Summary (Last 24 hours) at 01/25/2022 1556 Last data filed at 01/25/2022 1300 Gross per 24 hour  Intake 1200 ml  Output 325 ml  Net 875 ml   Filed Weights   01/24/22 2324  Weight: 86.3 kg    Examination:  General exam: Appears calm Respiratory  system: unlabored Cardiovascular system: RRR Gastrointestinal system: peg with ulceration medial  Central nervous system: sleeping when I reevaluated him after procedure (prior to this moaning at times as he was being adjusted for bathing) Deep tissue injury to buttocks Extremities: no LEE   Data Reviewed: I have personally reviewed following labs and imaging studies  CBC: Recent Labs  Lab 01/19/22 0226 01/20/22 0305 01/21/22 0604 01/24/22 1524 01/25/22 0106 01/25/22 1429  WBC 8.0 8.8 8.5 6.4 6.2  --   NEUTROABS  --   --  6.2 4.8  --   --   HGB 8.9* 8.4* 8.7* 7.7* 7.1* 7.6*  HCT 28.9* 26.6* 26.7* 25.8* 23.1* 25.1*  MCV 99.3 99.6 98.2 102.8* 100.4*  --   PLT 118* 109* 107* 115* 107*  --     Basic Metabolic Panel: Recent Labs  Lab 01/19/22 0226 01/21/22 0604 01/24/22 1524 01/25/22 0106  NA 137 133* 138 139  K 5.0 4.9 4.4 4.3  CL 106 103 103 108  CO2 '27 24 28 '$ 25  GLUCOSE 171* 165* 123* 118*  BUN 24* 26* 43* 39*  CREATININE 1.12 1.00 1.09 1.09  CALCIUM 8.7* 9.1 9.8 9.5  MG 2.0 1.9  --   --     GFR: Estimated Creatinine Clearance: 51.8 mL/min (by C-G formula based on SCr of 1.09 mg/dL).  Liver Function Tests: Recent Labs  Lab 01/24/22 1524 01/25/22 0106  AST 154* 104*  ALT 124* 107*  ALKPHOS 142* 133*  BILITOT 0.5 0.5  PROT 5.1* 4.8*  ALBUMIN 1.8* 1.8*    CBG: Recent Labs  Lab 01/20/22 2315 01/21/22 0328 01/21/22 0748 01/21/22 1212 01/21/22 1512  GLUCAP 188* 162* 173* 167* 178*     Recent Results (from the past 240 hour(s))  Urine Culture     Status: None   Collection Time: 01/17/22  8:54 AM   Specimen: Urine, Catheterized  Result Value Ref Range Status   Specimen Description URINE, CATHETERIZED  Final   Special Requests NONE  Final   Culture   Final    NO GROWTH Performed at [facility], [place] [address], [place], [place] [address]    Report Status 01/18/2022 FINAL  Final  Blood culture (routine x 2)     Status: None (Preliminary  result)   Collection Time: 01/24/22  3:24 PM   Specimen: BLOOD LEFT HAND  Result Value Ref Range Status   Specimen Description BLOOD LEFT HAND  Final   Special Requests   Final    BOTTLES DRAWN AEROBIC AND ANAEROBIC Blood Culture adequate volume   Culture   Final    NO GROWTH < 24 HOURS Performed at [facility], [place] [address] [place], [place] [address]    Report Status PENDING  Incomplete  Blood culture (routine x 2)     Status: None (Preliminary result)   Collection Time: 01/24/22  3:24 PM   Specimen: BLOOD RIGHT HAND  Result Value Ref Range Status   Specimen Description BLOOD RIGHT HAND  Final   Special Requests   Final    BOTTLES DRAWN AEROBIC AND ANAEROBIC Blood Culture adequate volume   Culture   Final    NO GROWTH < 24 HOURS Performed at [facility], [place] [address] [place], [place] [address]    Report Status PENDING  Incomplete  Gastrointestinal Panel by PCR , Stool     Status: None   Collection Time: 01/24/22  9:08 PM   Specimen: Peg Site; Stool  Result Value Ref Range Status   Campylobacter species NOT DETECTED NOT DETECTED Final   Plesimonas shigelloides NOT DETECTED NOT DETECTED Final   Salmonella species NOT DETECTED NOT DETECTED Final   Yersinia enterocolitica NOT DETECTED NOT DETECTED Final   Vibrio species NOT DETECTED NOT DETECTED Final   Vibrio cholerae NOT DETECTED NOT DETECTED Final   Enteroaggregative E coli (EAEC) NOT DETECTED NOT DETECTED Final   Enteropathogenic E coli (EPEC) NOT DETECTED NOT DETECTED Final   Enterotoxigenic E coli (ETEC) NOT DETECTED NOT DETECTED Final   Shiga like toxin producing E coli (STEC) NOT DETECTED NOT DETECTED Final   Shigella/Enteroinvasive E coli (EIEC) NOT DETECTED NOT DETECTED Final   Cryptosporidium NOT DETECTED NOT DETECTED Final   Cyclospora cayetanensis NOT DETECTED NOT DETECTED Final   Entamoeba histolytica NOT DETECTED NOT DETECTED Final   Giardia lamblia NOT DETECTED NOT DETECTED Final    Adenovirus F40/41 NOT DETECTED NOT DETECTED Final   Astrovirus NOT DETECTED NOT DETECTED Final   Norovirus GI/GII NOT DETECTED NOT DETECTED  Final   Rotavirus Natascha Edmonds NOT DETECTED NOT DETECTED Final   Sapovirus (I, II, IV, and V) NOT DETECTED NOT DETECTED Final    Comment: Performed at [facility], [place], [place] [address]  C Difficile Quick Screen w PCR reflex     Status: None   Collection Time: 01/24/22  9:08 PM   Specimen: Peg Site; Stool  Result Value Ref Range Status   C Diff antigen NEGATIVE NEGATIVE Final   C Diff toxin NEGATIVE NEGATIVE Final   C Diff interpretation No C. difficile detected.  Final    Comment: Performed at [facility], [place] [address] [place], [place] [address]  Aerobic Culture w Gram Stain (superficial specimen)     Status: None (Preliminary result)   Collection Time: 01/24/22 10:07 PM   Specimen: Wound  Result Value Ref Range Status   Specimen Description WOUND  Final   Special Requests PEG SITE  Final   Gram Stain   Final    NO SQUAMOUS EPITHELIAL CELLS SEEN FEW WBC SEEN FEW GRAM POSITIVE COCCI ABUNDANT GRAM NEGATIVE RODS    Culture   Final    TOO YOUNG TO READ Performed at [facility], [place] [address], [place], [place] [address]    Report Status PENDING  Incomplete         Radiology Studies: CT ABDOMEN PELVIS W CONTRAST  Result Date: 01/24/2022 CLINICAL DATA:  Abdominal pain and distention purulent discharge at the site of gastrostomy placement EXAM: CT ABDOMEN AND PELVIS WITH CONTRAST TECHNIQUE: Multidetector CT imaging of the abdomen and pelvis was performed using the standard protocol following bolus administration of intravenous contrast. RADIATION DOSE REDUCTION: This exam was performed according to the departmental dose-optimization program which includes automated exposure control, adjustment of the mA and/or kV according to patient size and/or use of iterative reconstruction technique. CONTRAST:  139m  OMNIPAQUE IOHEXOL 300 MG/ML  SOLN COMPARISON:  12/11/2021 FINDINGS: Lower chest: Heart is enlarged in size. Coronary artery calcifications are seen. There are metallic sutures in the sternum. Pacemaker leads are noted in the heart. Hepatobiliary: No focal abnormality is seen in the liver. There is slight prominence of intrahepatic bile ducts. Surgical clips are seen in gallbladder fossa. There is no significant dilation of extrahepatic bile ducts. Pancreas: No focal abnormality is seen. Spleen: Spleen measures 15.9 cm in maximum diameter. Adrenals/Urinary Tract: Adrenals are unremarkable. There is no hydronephrosis. Bilateral renal cysts are seen. Largest of the cysts is seen in the anterior upper pole of right kidney measuring 7.1 cm. Some of the low-density lesions are subcentimeter in size limiting characterization. There are no renal or ureteral stones. Foley catheter is seen in the bladder. There is subtle increased density in the dependent portion of urinary bladder. Stomach/Bowel: Small hiatal hernia is seen. Stomach is not distended. There is interval placement of gastrostomy. Balloon in the gastrostomy tube is noted projecting in the anterior margin of the gastric wall. There is mild stranding in the perigastric region at the site of gastrostomy. There is 6.4 x 2.2 cm area of inflammatory phlegmon in the subcutaneous plane at the site of gastrostomy placement. There are small pockets of air in the subcutaneous plane at the site of gastrostomy. This may be due to open wound in the skin or suggest infectious process in the subcutaneous plane. There is no definite demonstrable thick-walled fluid collection. Small bowel loops are not dilated. Appendix is not dilated. There is no significant wall thickening in  colon. Vascular/Lymphatic: There are scattered arterial calcifications. There is seen low-density in the infrarenal abdominal aorta, residual change from aortobifemoral bypass graft. The stent appears to  be patent. There is ectasia of left common iliac artery measuring 2.1 cm in the AP diameter. Reproductiveprostate is not enlarged. Please correlate for possible previous intervention in the prostate. Other: There is no ascites or pneumoperitoneum. There is ventral hernia containing fat to the left of midline above the level of umbilicus. Bilateral inguinal hernias containing fat are seen, larger on the right side. Musculoskeletal: Degenerative changes are noted in the lumbar spine. Healing fractures are seen in the right ala of sacrum posterior right iliac bone close to the SI joint right superior pubic ramus and right inferior pubic ramus. Mixed attenuation seen in the body of T12 vertebra as not changed. IMPRESSION: There is 6.4 x 2.2 cm area of inflammatory/infectious phlegmon in the subcutaneous plane at the site of placement of gastrostomy. There are pockets of air in the subcutaneous plane at the site gastrostomy tube placement. This may be due to open wound in the skin or suggest infection with gas producing organisms. There is no definite demonstrable thick-walled loculated fluid collection at the site of gastrostomy tube placement. Balloon in the tip of gastrostomy tube is projecting immediately anterior to the anterior wall of the stomach. Possibility of inflated balloon in the gastrostomy tube to be lying immediately anterior to the gastric wall is not excluded. There is no evidence of pneumoperitoneum or significant ascites. There is no evidence of intestinal obstruction or pneumoperitoneum. Appendix is not dilated. There is no hydronephrosis. Appendix is not dilated. Small hiatal hernia. Coronary artery disease. Bilateral renal cysts. Healing fractures are seen in the right ala of sacrum, right iliac bone, right superior pubic ramus and right inferior pubic ramus. Other findings as described in the body of the report. Electronically Signed   By: Elmer Picker M.D.   On: 01/24/2022 17:26   IR  Replc Gastro/Colonic Tube Percut W/Fluoro  Result Date: 01/25/2022 INDICATION: Leaking gastrostomy tube. Concern for abscess surrounding subcutaneous track of gastrostomy tube Request made for fluoroscopic guided gastrostomy tube exchange with potential up sizing. EXAM: FLUOROSCOPIC GUIDED REPLACEMENT OF GASTROSTOMY TUBE COMPARISON:  Image guided percutaneous gastrostomy tube placement-01/15/2022; CT abdomen pelvis-01/24/2022 MEDICATIONS: None. CONTRAST:  63m OMNIPAQUE IOHEXOL 300 MG/ML SOLN - administered into the gastric lumen FLUOROSCOPY TIME:  1 minute, 6 seconds (10 mGy) COMPLICATIONS: None immediate. PROCEDURE: Auther Lyerly timeout was performed prior to the initiation of the procedure. The upper abdomen and external portion of the existing gastrostomy tube was prepped and draped in the usual sterile fashion, and Micki Cassel sterile drape was applied covering the operative field. Maximum barrier sterile technique with sterile gowns and gloves were used for the procedure. Larenzo Caples timeout was performed prior to the initiation of the procedure. The existing gastrostomy tube was injected with Francina Beery small amount of contrast confirming appropriate positioning within the gastric lumen. The external portion of the gastrostomy tube was trimmed and cannulated with Joscelynn Brutus short Amplatz wire was coiled within the gastric lumen to the level of the gastric fundus. Next, utilizing gentle traction, the disc retention gastrostomy tube was removed intact. Next, the track was serially dilated ultimately allowing placement of [place] Seto new 24 French balloon inflatable gastrostomy tube. The balloon was inflated with approximately 20 cc of saline and dilute contrast and pulled against the anterior interval of the stomach. The external disc was gently cinched at the skin entrance surface site. Contrast injection demonstrated  appropriate positioning and functionality of the new, slightly larger gastrostomy tube. Next, time was spent debriding the apparent pressure ulcer  about the medial aspect of the gastrostomy tube entrance site with expression of Tu Bayle moderate amount of purulent fluid undermining the adjacent subcutaneous tissues. Dressings were applied. The patient tolerated the procedure well without immediate postprocedural complication. IMPRESSION: Successful fluoroscopic guided exchange and up sizing of now 24 French gastrostomy tube. The gastrostomy tube is ready for immediate use. Given apparent pressure ulcer adjacent to the medial aspect of the gastrostomy tube insertion site, wound care consultation may be performed as indicated. Electronically Signed   By: Sandi Mariscal M.D.   On: 01/25/2022 14:19   DG Chest Portable 1 View  Result Date: 01/24/2022 CLINICAL DATA:  chest pain, drainage around peg site EXAM: PORTABLE CHEST 1 VIEW COMPARISON:  Chest radiograph Jan 06, 2022. FINDINGS: Low lung volumes. No consolidation. No visible pleural effusions or pneumothorax. Left subclavian approach cardiac rhythm maintenance device in similar position. CABG and median sternotomy. Similar mild enlargement the cardiac silhouette. No displaced fracture identified. IMPRESSION: No evidence of acute cardiopulmonary disease. Electronically Signed   By: [name] M.D.   On: 01/24/2022 14:49        Scheduled Meds:  acetaminophen  1,000 mg Per Tube Q6H   amiodarone  200 mg Per Tube Daily   apixaban  5 mg Per Tube BID   Chlorhexidine Gluconate Cloth  6 each Topical Q0600   famotidine  20 mg Per Tube BID   feeding supplement (KATE FARMS STANDARD 1.4)  325 mL Per Tube 5 X Daily   fluticasone furoate-vilanterol  1 puff Inhalation Daily   free water  120 mL Per Tube 5 X Daily   lidocaine       mirtazapine  7.5 mg Per Tube QHS   [START ON 01/26/2022] nutrition supplement (JUVEN)  1 packet Per Tube BID BM   venlafaxine  18.75 mg Per Tube BID WC   Continuous Infusions:  meropenem (MERREM) IV 1 g (01/25/22 1409)   vancomycin       LOS: 1 day    Time spent: over 30  min    Fayrene Helper, MD [facility]   To contact the attending provider between 7A-7P or the covering provider during after hours 7P-7A, please log into the web site [email or web address] and access using universal [place] password for that web site. If you do not have the password, please call the hospital operator.  01/25/2022, 3:56 PM

## 2022-01-25 NOTE — Plan of Care (Signed)
  Problem: Pain Managment: Goal: General experience of comfort will improve Outcome: Progressing   Problem: Safety: Goal: Ability to remain free from injury will improve Outcome: Progressing   

## 2022-01-25 NOTE — Progress Notes (Signed)
Referring Physician(s): Tegeler, Gwenyth Allegra, MD   Supervising Physician: Sandi Mariscal  Patient Status:  [facility] - In-pt  Chief Complaint:  Bevely Palmer site infection  Brief History:  [name] is an 88 year old male who was admitted from [facility] 12/10/21.  Has very poor po intake since discharged to SNF, deconditioning. Dysphagia and Altered mental status.  He underwent placement of a gastrostomy tube by Dr. Kathlene Cote on 01/15/22.  He presented to the ED yesterday with foul smelling drainage from the Gtube stoma site.  His daughter is in the room, she is a Marine scientist.  CT scan showed = CT abdomen pelvis showed an area of phlegmon at the placement site of gastrotomy but no definitive loculated fluid collection.  Balloon in the tip of gastrotomy tube projecting immediately anterior to the anterior wall of the stomach.  No evidence of intestinal obstruction or pneumoperitoneum.  Allergies: Penicillins, Tramadol, and Ketorolac tromethamine  Medications: Prior to Admission medications   Medication Sig Start Date End Date Taking? Authorizing Provider  acetaminophen (TYLENOL) 160 MG/5ML solution Place 31.3 mLs (1,000 mg total) into feeding tube every 6 (six) hours. Patient taking differently: Place 960 mg into feeding tube every 6 (six) hours. 30 mls - 960 mg 01/21/22  Yes Nicole Kindred A, DO  albuterol (PROVENTIL HFA;VENTOLIN HFA) 108 (90 Base) MCG/ACT inhaler Inhale 2 puffs into the lungs every 6 (six) hours as needed for wheezing or shortness of breath. 02/12/18  Yes [name], [name], [name]  alum & mag hydroxide-simeth (MAALOX/MYLANTA) 200-200-20 MG/5ML suspension Place 30 mLs into feeding tube every 4 (four) hours as needed for indigestion, heartburn or flatulence. 01/21/22  Yes Ezekiel Slocumb, DO  amiodarone (PACERONE) 200 MG tablet Place 1 tablet (200 mg total) into feeding tube daily. 01/22/22  Yes Ezekiel Slocumb, DO  apixaban (ELIQUIS) 5 MG TABS tablet Place 1 tablet (5 mg total)  into feeding tube 2 (two) times daily. 01/21/22  Yes Nicole Kindred A, DO  azelastine (ASTELIN) 0.1 % nasal spray Place 1 spray into both nostrils 2 (two) times daily. 10/15/21  Yes Gottschalk, Leatrice Jewels M, DO  bisacodyl (DULCOLAX) 10 MG suppository Place 1 suppository (10 mg total) rectally daily as needed for moderate constipation. 12/17/21  Yes Winferd Humphrey, PA-C  budesonide-formoterol (SYMBICORT) 160-4.5 MCG/ACT inhaler Inhale 2 puffs into the lungs 2 (two) times daily. 03/22/19  Yes [name], [name], [name]  diphenoxylate-atropine (LOMOTIL) 2.5-0.025 MG tablet Place 1 tablet into feeding tube 4 (four) times daily as needed for diarrhea or loose stools. 01/21/22 01/21/23 Yes Nicole Kindred A, DO  fluticasone (FLONASE) 50 MCG/ACT nasal spray Place 2 sprays into both nostrils daily as needed for allergies or rhinitis.   Yes [provider]  lidocaine (LIDODERM) 5 % Place 1 patch onto the skin at bedtime. Remove & Discard patch within 12 hours or as directed by MD   Yes [provider]  meclizine (ANTIVERT) 25 MG tablet Take 1 tablet (25 mg total) by mouth 3 (three) times daily as needed for dizziness. 05/13/21  Yes Sherwood Gambler, MD  metFORMIN (GLUCOPHAGE) 500 MG tablet Take 1 tablet (500 mg total) by mouth daily with breakfast. Patient taking differently: Place 500 mg into feeding tube daily with breakfast. 10/15/21  Yes Gottschalk, Ashly M, DO  mirtazapine (REMERON) 7.5 MG tablet Place 1 tablet (7.5 mg total) into feeding tube at bedtime. 01/21/22  Yes Nicole Kindred A, DO  Nutritional Supplements (FEEDING SUPPLEMENT, OSMOLITE 1.5 CAL,) LIQD Place 355 mLs into feeding  tube 4 (four) times daily. Patient taking differently: Place 355 mLs into feeding tube every 6 (six) hours. 01/21/22  Yes Nicole Kindred A, DO  nystatin (MYCOSTATIN) 100000 UNIT/ML suspension Take 5 mLs (500,000 Units total) by mouth 4 (four) times daily. Patient taking differently: Take 5 mLs by mouth 4 (four) times  daily. Swish and spit 01/21/22  Yes Nicole Kindred A, DO  ondansetron (ZOFRAN-ODT) 8 MG disintegrating tablet Take 1 tablet (8 mg total) by mouth every 8 (eight) hours as needed for nausea or vomiting. 01/21/22  Yes Nicole Kindred A, DO  oxyCODONE (OXY IR/ROXICODONE) 5 MG immediate release tablet Place 1 tablet (5 mg total) into feeding tube every 4 (four) hours as needed for severe pain. 01/21/22  Yes Nicole Kindred A, DO  pantoprazole (PROTONIX) 20 MG tablet 40 mg every morning. Per tube   Yes [provider]  rosuvastatin (CRESTOR) 40 MG tablet Place 1 tablet (40 mg total) into feeding tube at bedtime. 01/21/22  Yes Ezekiel Slocumb, DO  venlafaxine (EFFEXOR) 37.5 MG tablet Place 0.5 tablets (18.75 mg total) into feeding tube 2 (two) times daily with a meal. 01/21/22  Yes Nicole Kindred A, DO  docusate sodium (COLACE) 100 MG capsule Take 1 capsule (100 mg total) by mouth 2 (two) times daily. Patient not taking: Reported on 01/25/2022 12/17/21   Winferd Humphrey, PA-C  famotidine (PEPCID) 20 MG tablet Place 1 tablet (20 mg total) into feeding tube 2 (two) times daily. Patient not taking: Reported on 01/25/2022 01/21/22 03/22/22  Nicole Kindred A, DO  feeding supplement (ENSURE ENLIVE / ENSURE PLUS) LIQD Take 237 mLs by mouth 3 (three) times daily between meals. Patient not taking: Reported on 01/25/2022 01/21/22   Ezekiel Slocumb, DO  Lancets [facility] ULTRASOFT) lancets Use to check blood sugars daily 10/16/17   [name], [name]  [facility] VERIO test strip TEST BLOOD SUGARS DAILY DX E11.9 07/22/21   Ronnie Doss M, DO  senna-docusate (SENOKOT-S) 8.6-50 MG tablet Place 1 tablet into feeding tube at bedtime as needed for mild constipation. Patient not taking: Reported on 01/25/2022 01/21/22   Nicole Kindred A, DO     Vital Signs: BP (!) 104/48 (BP Location: Left Arm)   Pulse 66   Temp (!) 97.4 F (36.3 C) (Axillary)   Resp 18   Wt 190 lb 4.1 oz (86.3 kg)   SpO2 96%   BMI  26.54 kg/m   Physical Exam Constitutional:      Comments: Awake. Moans when manipulating Gtube - obviously very tender.  Neurological:     Mental Status: Mental status is at baseline.   Site prior to cleansing - significan purulent drainage   Site after cleanse - obvious necrotic tissue. + Odor   Imaging: CT ABDOMEN PELVIS W CONTRAST  Result Date: 01/24/2022 CLINICAL DATA:  Abdominal pain and distention purulent discharge at the site of gastrostomy placement EXAM: CT ABDOMEN AND PELVIS WITH CONTRAST TECHNIQUE: Multidetector CT imaging of the abdomen and pelvis was performed using the standard protocol following bolus administration of intravenous contrast. RADIATION DOSE REDUCTION: This exam was performed according to the departmental dose-optimization program which includes automated exposure control, adjustment of the mA and/or kV according to patient size and/or use of iterative reconstruction technique. CONTRAST:  155m OMNIPAQUE IOHEXOL 300 MG/ML  SOLN COMPARISON:  12/11/2021 FINDINGS: Lower chest: Heart is enlarged in size. Coronary artery calcifications are seen. There are metallic sutures in the sternum. Pacemaker leads are noted in the heart. Hepatobiliary: No  focal abnormality is seen in the liver. There is slight prominence of intrahepatic bile ducts. Surgical clips are seen in gallbladder fossa. There is no significant dilation of extrahepatic bile ducts. Pancreas: No focal abnormality is seen. Spleen: Spleen measures 15.9 cm in maximum diameter. Adrenals/Urinary Tract: Adrenals are unremarkable. There is no hydronephrosis. Bilateral renal cysts are seen. Largest of the cysts is seen in the anterior upper pole of right kidney measuring 7.1 cm. Some of the low-density lesions are subcentimeter in size limiting characterization. There are no renal or ureteral stones. Foley catheter is seen in the bladder. There is subtle increased density in the dependent portion of urinary bladder.  Stomach/Bowel: Small hiatal hernia is seen. Stomach is not distended. There is interval placement of gastrostomy. Balloon in the gastrostomy tube is noted projecting in the anterior margin of the gastric wall. There is mild stranding in the perigastric region at the site of gastrostomy. There is 6.4 x 2.2 cm area of inflammatory phlegmon in the subcutaneous plane at the site of gastrostomy placement. There are small pockets of air in the subcutaneous plane at the site of gastrostomy. This may be due to open wound in the skin or suggest infectious process in the subcutaneous plane. There is no definite demonstrable thick-walled fluid collection. Small bowel loops are not dilated. Appendix is not dilated. There is no significant wall thickening in colon. Vascular/Lymphatic: There are scattered arterial calcifications. There is seen low-density in the infrarenal abdominal aorta, residual change from aortobifemoral bypass graft. The stent appears to be patent. There is ectasia of left common iliac artery measuring 2.1 cm in the AP diameter. Reproductiveprostate is not enlarged. Please correlate for possible previous intervention in the prostate. Other: There is no ascites or pneumoperitoneum. There is ventral hernia containing fat to the left of midline above the level of umbilicus. Bilateral inguinal hernias containing fat are seen, larger on the right side. Musculoskeletal: Degenerative changes are noted in the lumbar spine. Healing fractures are seen in the right ala of sacrum posterior right iliac bone close to the SI joint right superior pubic ramus and right inferior pubic ramus. Mixed attenuation seen in the body of T12 vertebra as not changed. IMPRESSION: There is 6.4 x 2.2 cm area of inflammatory/infectious phlegmon in the subcutaneous plane at the site of placement of gastrostomy. There are pockets of air in the subcutaneous plane at the site gastrostomy tube placement. This may be due to open wound in the  skin or suggest infection with gas producing organisms. There is no definite demonstrable thick-walled loculated fluid collection at the site of gastrostomy tube placement. Balloon in the tip of gastrostomy tube is projecting immediately anterior to the anterior wall of the stomach. Possibility of inflated balloon in the gastrostomy tube to be lying immediately anterior to the gastric wall is not excluded. There is no evidence of pneumoperitoneum or significant ascites. There is no evidence of intestinal obstruction or pneumoperitoneum. Appendix is not dilated. There is no hydronephrosis. Appendix is not dilated. Small hiatal hernia. Coronary artery disease. Bilateral renal cysts. Healing fractures are seen in the right ala of sacrum, right iliac bone, right superior pubic ramus and right inferior pubic ramus. Other findings as described in the body of the report. Electronically Signed   By: Elmer Picker M.D.   On: 01/24/2022 17:26   DG Chest Portable 1 View  Result Date: 01/24/2022 CLINICAL DATA:  chest pain, drainage around peg site EXAM: PORTABLE CHEST 1 VIEW COMPARISON:  Chest radiograph  Jan 06, 2022. FINDINGS: Low lung volumes. No consolidation. No visible pleural effusions or pneumothorax. Left subclavian approach cardiac rhythm maintenance device in similar position. CABG and median sternotomy. Similar mild enlargement the cardiac silhouette. No displaced fracture identified. IMPRESSION: No evidence of acute cardiopulmonary disease. Electronically Signed   By: [name] M.D.   On: 01/24/2022 14:49    Labs:  CBC: Recent Labs    01/20/22 0305 01/21/22 0604 01/24/22 1524 01/25/22 0106  WBC 8.8 8.5 6.4 6.2  HGB 8.4* 8.7* 7.7* 7.1*  HCT 26.6* 26.7* 25.8* 23.1*  PLT 109* 107* 115* 107*    COAGS: Recent Labs    07/12/21 0910 01/06/22 1247  INR 1.1 1.3*  APTT 28 29    BMP: Recent Labs    01/19/22 0226 01/21/22 0604 01/24/22 1524 01/25/22 0106  NA 137 133* 138 139  K  5.0 4.9 4.4 4.3  CL 106 103 103 108  CO2 '27 24 28 25  '$ GLUCOSE 171* 165* 123* 118*  BUN 24* 26* 43* 39*  CALCIUM 8.7* 9.1 9.8 9.5  CREATININE 1.12 1.00 1.09 1.09  GFRNONAA >60 >60 >60 >60    LIVER FUNCTION TESTS: Recent Labs    07/01/21 1523 10/15/21 0927 01/06/22 1247 01/24/22 1524 01/25/22 0106  BILITOT 0.9  --  1.6* 0.5 0.5  AST 24  --  20 154* 104*  ALT 21  --  23 124* 107*  ALKPHOS 93  --  173* 142* 133*  PROT 6.1  --  5.5* 5.1* 4.8*  ALBUMIN 3.8 3.8 2.5* 1.8* 1.8*    Assessment and Plan:  Necrotic gastrostomy tube stoma  Reviewed photos and discussed options with Dr. Pascal Lux.   Tube was placed too recently to safely remove it.  Will proceed with exchange today.  Also will have wound/ostomy nurse evaluate.  Electronically Signed: Murrell Redden, PA-C 01/25/2022, 10:43 AM    I spent a total of 15 Minutes at the the patient's bedside AND on the patient's hospital floor or unit, greater than 50% of which was counseling/coordinating care for Gtube issue/exchange.

## 2022-01-25 NOTE — Procedures (Signed)
Pre procedural Dx: Dysphagia, poorly functioning feeding tube. Post procedural Dx: Same  Successful fluoroscopic guided replacement and up sizing of now 24 Fr balloon retention gastrostomy tube.   The feeding tube is ready for immediate use.  Note is made of a suspected pressure ulcer about the medial aspect of the G-tube insertion, likely due to the insertion angle of the tube and the external retention disc being too tight.  Pt may benefit from a wound care consult.    EBL: Trace Complications: None immediate.  Ronny Bacon, MD Pager #: [phone]

## 2022-01-25 NOTE — Progress Notes (Addendum)
Initial Nutrition Assessment  DOCUMENTATION CODES:   Not applicable  INTERVENTION:   Initiate [employer] 1.4 standard formula- Give 1 carton (333ml) five times daily via tube- Flush with 42ml of water before and after each feeding.   Regimen provides 2275kcal/day, 100g/day protein and 1722ml/day of free water.   Pt at high refeed risk; recommend monitor potassium, magnesium and phosphorus labs daily until stable  Juven Fruit Punch BID via tube, each serving provides 95kcal and 2.5g of protein (amino acids glutamine and arginine)  NUTRITION DIAGNOSIS:   Inadequate oral intake related to dysphagia as evidenced by other (comment) (pt with chronic G-tube).  GOAL:   Patient will meet greater than or equal to 90% of their needs  MONITOR:   Diet advancement, Labs, Weight trends, TF tolerance, Skin, I & O's  REASON FOR ASSESSMENT:   Consult Assessment of nutrition requirement/status  ASSESSMENT:   88 y.o. male with medical history significant of paroxysmal atrial fibrillation, chronic systolic CHF, CAD s/p CABG x 3, sick sinus syndrome s/p pacemaker, COPD, type 2 diabetes, hypertension, hyperlipidemia, chronic thrombocytopenia, incisional hernia s/p repair 2004, inguinal hernia repair, recurrent UTI, OSA not on CPAP, prostate ca, COVID 19 (6/22), AAA, GERD, CVA and dysphagia s/p IR G tube 5/24 who presents with concerns of purulent discharge from feeding tube and diarrhea.  RD working remotely.  -Pt s/p IR replacement of 24 Fr balloon retention gastrostomy tube today.   Spoke with pt daughter via phone. Daughter reports that patient has been able to tolerate 6 cartons of tube feed daily at home but he has been having large amounts of diarrhea with tube feeds. Daughter is asking about adding fiber supplementation today to help with diarrhea. RD unsure if patient was having diarrhea prior to discharge. Stool studies sent off and are negative. Pt's last tube feed was yesterday; pt has  no BMs documented today. Discussed with pt's daughter the different options for tube feeds. Daughter would like to try and switch pt over to a plant based formula as he has been unable to tolerate the dairy based standard formula. Pt has been eating some soft foods at home. Pt noted to have purulent drainage from tube and abdominal distension. G-tube exchanged by IR today for a larger size. Will plan to resume tube feeds as tube is ready for immediate use per IR. Pt previously diagnosed with malnutrition. RD will obtain nutrition related exam at follow up.   Per chart, pt appears weight stable pta.   Medications reviewed and include: pepcid, remeron, meropenem, vancomycin   Labs reviewed: K 4.3 wnl, BUN 39(H), alk phos 133(H), AST 104(H), ALT 107(H), alb 1.8(L) Hgb 7.1(L), Hct 23.1(L)  NUTRITION - FOCUSED PHYSICAL EXAM: Unable to perform at this time   Diet Order:   Diet Order             Diet NPO time specified  Diet effective now                  EDUCATION NEEDS:   Education needs have been addressed (with pt's daughter)  Skin:  Skin Assessment: Reviewed RN Assessment (ecchymosis, DTI buttocks)  Last BM:  6/2- type 7  Height:   Ht Readings from Last 1 Encounters:  01/25/22 $RemoveB'5\' 11"'[id number]$  (1.803 m)    Weight:   Wt Readings from Last 1 Encounters:  01/24/22 86.3 kg    Ideal Body Weight:  78 kg  BMI:  Body mass index is 26.54 kg/m.  Estimated Nutritional Needs:  Kcal:  1900-2200kcal/day  Protein:  95-110g/day  Fluid:  1.9-2.2L/day  Koleen Distance MS, RD, LDN Please refer to [facility] for RD and/or RD on-call/weekend/after hours pager

## 2022-01-25 NOTE — Hospital Course (Addendum)
[name] is a 88 y.o. male with medical history significant of paroxysmal atrial fibrillation, chronic systolic CHF, CAD s/p CABG, sick sinus syndrome s/p pacemaker, COPD, type 2 diabetes, hypertension, hyperlipidemia, chronic thrombocytopenia, recurrent UTI, OSA not on CPAP, dysphagia with PEG tube who presents with concerns of purulent discharge from feeding tube.  S/p upsizing of g tube on 6/3.  Surgery c/s and recommended keeping g tube to gravity to ensure no pressure of phlange on skin and balloon not pulled up against intraabdominal wall.  Per Dr. Pascal Lux, suspected pressure ulcer about medial aspect of g tube insertion - likely due to insertion angle of tube and external retention disc being too tight. General surgery is following periodically. They recommended keeping G tube to gravity to ensure no pressure on skin and balloon not pulled up against intraabdominal wall.   He underwent cortrak placement for tube feeding. For the next several days, patient was continued on core track feeding while G tube was site was monitored for healing. 6/19, patient underwent image guided conversion of G-tube to [place] feeding tube.  Per IR note, the t-tack/mesh conglomerate was sharply excised. See below for details

## 2022-01-25 NOTE — Plan of Care (Signed)
  Problem: Nutrition: Goal: Adequate nutrition will be maintained Outcome: Progressing   Problem: Coping: Goal: Level of anxiety will decrease Outcome: Progressing   Problem: Pain Managment: Goal: General experience of comfort will improve Outcome: Progressing   Problem: Skin Integrity: Goal: Risk for impaired skin integrity will decrease Outcome: Progressing   Problem: Health Behavior/Discharge Planning: Goal: Ability to manage health-related needs will improve Outcome: Not Progressing

## 2022-01-26 ENCOUNTER — Inpatient Hospital Stay (HOSPITAL_COMMUNITY): Payer: Medicare Other

## 2022-01-26 DIAGNOSIS — L03818 Cellulitis of other sites: Secondary | ICD-10-CM | POA: Diagnosis not present

## 2022-01-26 LAB — COMPREHENSIVE METABOLIC PANEL
ALT: 107 U/L — ABNORMAL HIGH (ref 0–44)
AST: 113 U/L — ABNORMAL HIGH (ref 15–41)
Albumin: 1.6 g/dL — ABNORMAL LOW (ref 3.5–5.0)
Alkaline Phosphatase: 127 U/L — ABNORMAL HIGH (ref 38–126)
Anion gap: 6 (ref 5–15)
BUN: 41 mg/dL — ABNORMAL HIGH (ref 8–23)
CO2: 26 mmol/L (ref 22–32)
Calcium: 9.3 mg/dL (ref 8.9–10.3)
Chloride: 109 mmol/L (ref 98–111)
Creatinine, Ser: 1.06 mg/dL (ref 0.61–1.24)
GFR, Estimated: >60 mL/min (ref >60)
Glucose, Bld: 115 mg/dL — ABNORMAL HIGH (ref 70–99)
Potassium: 3.8 mmol/L (ref 3.5–5.1)
Sodium: 141 mmol/L (ref 135–145)
Total Bilirubin: 0.4 mg/dL (ref 0.3–1.2)
Total Protein: 4.4 g/dL — ABNORMAL LOW (ref 6.5–8.1)

## 2022-01-26 LAB — CBC WITH DIFFERENTIAL/PLATELET
Abs Immature Granulocytes: 0.06 10*3/uL (ref 0.00–0.07)
Basophils Absolute: 0 10*3/uL (ref 0.0–0.1)
Basophils Relative: 0 %
Eosinophils Absolute: 0.1 10*3/uL (ref 0.0–0.5)
Eosinophils Relative: 2 %
HCT: 23.3 % — ABNORMAL LOW (ref 39.0–52.0)
Hemoglobin: 7.2 g/dL — ABNORMAL LOW (ref 13.0–17.0)
Immature Granulocytes: 2 %
Lymphocytes Relative: 18 %
Lymphs Abs: 0.7 10*3/uL (ref 0.7–4.0)
MCH: 31 pg (ref 26.0–34.0)
MCHC: 30.9 g/dL (ref 30.0–36.0)
MCV: 100.4 fL — ABNORMAL HIGH (ref 80.0–100.0)
Monocytes Absolute: 0.3 10*3/uL (ref 0.1–1.0)
Monocytes Relative: 7 %
Neutro Abs: 2.9 10*3/uL (ref 1.7–7.7)
Neutrophils Relative %: 71 %
Platelets: 98 10*3/uL — ABNORMAL LOW (ref 150–400)
RBC: 2.32 MIL/uL — ABNORMAL LOW (ref 4.22–5.81)
RDW: 18.9 % — ABNORMAL HIGH (ref 11.5–15.5)
WBC: 4.1 10*3/uL (ref 4.0–10.5)
nRBC: 0 % (ref 0.0–0.2)

## 2022-01-26 LAB — GLUCOSE, CAPILLARY
Glucose-Capillary: 107 mg/dL — ABNORMAL HIGH (ref 70–99)
Glucose-Capillary: 110 mg/dL — ABNORMAL HIGH (ref 70–99)

## 2022-01-26 LAB — IRON AND TIBC
Iron: 31 ug/dL — ABNORMAL LOW (ref 45–182)
Saturation Ratios: 18 % (ref 17.9–39.5)
TIBC: 169 ug/dL — ABNORMAL LOW (ref 250–450)
UIBC: 138 ug/dL

## 2022-01-26 LAB — FERRITIN: Ferritin: 591 ng/mL — ABNORMAL HIGH (ref 24–336)

## 2022-01-26 LAB — MAGNESIUM: Magnesium: 1.9 mg/dL (ref 1.7–2.4)

## 2022-01-26 LAB — PHOSPHORUS: Phosphorus: 2.3 mg/dL — ABNORMAL LOW (ref 2.5–4.6)

## 2022-01-26 LAB — FOLATE: Folate: 10 ng/mL (ref >5.9)

## 2022-01-26 LAB — VITAMIN B12: Vitamin B-12: 381 pg/mL (ref 180–914)

## 2022-01-26 MED ORDER — DIATRIZOATE MEGLUMINE & SODIUM 66-10 % PO SOLN
ORAL | Status: AC
  Administered 2022-01-26: 30 mL via GASTROSTOMY
  Filled 2022-01-26: qty 30

## 2022-01-26 MED ORDER — FLUCONAZOLE IN SODIUM CHLORIDE 200-0.9 MG/100ML-% IV SOLN
200.0000 mg | Freq: Once | INTRAVENOUS | Status: AC
Start: 2022-01-26 — End: 2022-01-26
  Administered 2022-01-26: 200 mg via INTRAVENOUS
  Filled 2022-01-26: qty 100

## 2022-01-26 MED ORDER — MORPHINE SULFATE (PF) 2 MG/ML IV SOLN
1.0000 mg | INTRAVENOUS | Status: DC | PRN
  Administered 2022-01-26 – 2022-02-01 (×4): 1 mg via INTRAVENOUS
  Filled 2022-01-26 (×5): qty 1

## 2022-01-26 MED ORDER — FLUCONAZOLE 100 MG PO TABS
100.0000 mg | ORAL_TABLET | Freq: Every day | ORAL | Status: DC
  Administered 2022-01-28 – 2022-01-30 (×3): 100 mg
  Filled 2022-01-26 (×4): qty 1

## 2022-01-26 MED ORDER — DEXTROSE IN LACTATED RINGERS 5 % IV SOLN: INTRAVENOUS | Status: DC

## 2022-01-26 MED ORDER — ZINC OXIDE 12.8 % EX OINT: TOPICAL_OINTMENT | CUTANEOUS | Status: DC | PRN

## 2022-01-26 NOTE — Significant Event (Signed)
Patient was noticed to have PEG tube feed leaks.  I am holding PEG tube feeds at this time and getting a KUB with Gastrografin injection after discussion with radiologist.  I also consulted interventional radiology for their opinion.  Dr. Hal Hope.

## 2022-01-26 NOTE — Progress Notes (Signed)
PROGRESS NOTE    [name]  GMW:[id number] DOB: 02/16/1933 DOA: 01/24/2022 PCP: [name], DO  Chief Complaint  Patient presents with   feeding tube issue     Brief Narrative:  [name] is [name] 88 y.o. male with medical history significant of paroxysmal atrial fibrillation, chronic systolic CHF, CAD s/p CABG, sick sinus syndrome s/p pacemaker, COPD, type 2 diabetes, hypertension, hyperlipidemia, chronic thrombocytopenia, recurrent UTI, OSA not on CPAP, dysphagia with PEG tube who presents with concerns of purulent discharge from feeding tube.    Assessment & Plan:   Principal Problem:   Cellulitis Active Problems:   Hypotension   Paroxysmal atrial fibrillation (HCC)   HFrEF (heart failure with reduced ejection fraction) (HCC)   C6 cervical fracture (HCC)   Controlled type 2 diabetes mellitus without complication, without long-term current use of insulin (HCC)   S/P CABG x 3   Dysphagia   Anemia   Urinary retention   Diarrhea   Abnormal LFTs   Pressure ulcer   Status post insertion of percutaneous endoscopic gastrostomy (PEG) tube ([place])   Assessment and Plan: * Cellulitis Pt has dysphagia and is s/p PEG tube placement on 5/24.  Presents with malodor and purulent discharge from PEG tube site. CT abd/pelvis showed 6.4x2.2 cm area of inflammatory/infectious phlegmon in subcutaneous plaine at site of placement of gastrostomy, pockets of air in the subcutaneous plane at the site of g tube placement (due to open wound in skin or suggest infection with gas producing organisms).  No definite demonstrable thick walled loculated fluid collection. S/p fluoroscopic guided replacement and up sizing of now 24 fr ballon retention gastrostomy tube 6/3 per Dr. Pascal Lux, suspected pressure ulcer about medial aspect of g tube insertion - likely due to insertion angle of tube and external retention disc being too tight Overnight, concern for leaking of peg tube, no extravasation  identified on contrast injection of g tube.  Discussed with Dr. Pascal Lux, no new recs.   General surgery c/s due to increasing drainage from wound   -continue with IV Vancomycin and add IV meropenem due to PCN allergy for now -Wound cultures are pending (moderate staph epi, will follow) -blood cultures pending -Wound care consulted  Hypotension Presented with hypotension with SBP of 90 over 50s.  This has improved with fluid but remains soft.  Previously on Lasix, Entresto, Aldactone and beta-blockers at home but these were hold during last admission.  We will continue to hold now due to soft blood pressure -Continuous low rate IV fluid overnight  -repeat echo -consider adding midodrine?  C6 cervical fracture ([place]) This is from [facility] ground-level fall back in April.  Continue soft cervical collar and continue follow-up with neurosurgery.  HFrEF (heart failure with reduced ejection fraction) ([place]) Appears euvolemic on exam.  Continue to monitor fluid status with IV continuous fluid. Echo as above, caution with IVF  Paroxysmal atrial fibrillation (HCC) Continue Eliquis  Controlled type 2 diabetes mellitus without complication, without long-term current use of insulin (HCC) Controlled.  Last A1c 5.2% on 5/15.  Hold on sliding scale insulin for now.  Monitor blood glucose with morning labs.  S/P CABG x 3 Stable and asymptomatic from cardiac standpoint.  Anemia Trend Anemia labs c/w aocd, iron def   Dysphagia S/p PEG tube by VIR on 5/24, replaced today as above -Dietitian consulted for tube feeding -Continuous low-dose IV fluid overnight  Urinary retention Foley catheter placed on 5/27 due to urinary retention at recent admission.  He has planned outpatient follow-up with urology. May consider trial of void while he's here, will review  Diarrhea Daughter reports persistent diarrhea since the start of tube feed on 5/25.  Negative GI pathogen panel Negative c diff Follow, adjusting  feeds per dietitian   Abnormal LFTs Mildly elevated Will follow  Pressure ulcer Pressure Injury 01/24/22 Buttocks Deep Tissue Pressure Injury - Purple or maroon localized area of discolored intact skin or blood-filled blister due to damage of underlying soft tissue from pressure and/or shear. (Active)  01/24/22 2036  Location: Buttocks  Location Orientation:   Staging: Deep Tissue Pressure Injury - Purple or maroon localized area of discolored intact skin or blood-filled blister due to damage of underlying soft tissue from pressure and/or shear.  Wound Description (Comments):   Present on Admission: Yes   Wound c/s        DVT prophylaxis: eliquis Code Status: full Family Communication: daughter, wife Disposition:   Status is: Inpatient Remains inpatient appropriate because: need for further abx, wound care   Consultants:  IR  Procedures:  Successful fluoroscopic guided replacement and up sizing of now 24 Fr balloon retention gastrostomy tube.    Antimicrobials:  Anti-infectives (From admission, onward)    Start     Dose/Rate Route Frequency Ordered Stop   01/27/22 1000  fluconazole (DIFLUCAN) tablet 100 mg        100 mg Per Tube Daily 01/26/22 0353 02/02/22 0959   01/26/22 1000  fluconazole (DIFLUCAN) 40 MG/ML suspension 100 mg  Status:  Discontinued       See Hyperspace for full Linked Orders Report.   100 mg Per Tube Daily 01/25/22 1833 01/26/22 0353   01/26/22 0500  fluconazole (DIFLUCAN) IVPB 200 mg        200 mg 100 mL/hr over 60 Minutes Intravenous  Once 01/26/22 0353 01/26/22 0533   01/25/22 1930  fluconazole (DIFLUCAN) 40 MG/ML suspension 200 mg  Status:  Discontinued       See Hyperspace for full Linked Orders Report.   200 mg Per Tube  Once 01/25/22 1833 01/26/22 0353   01/25/22 1800  vancomycin (VANCOCIN) IVPB 1000 mg/200 mL premix        1,000 mg 200 mL/hr over 60 Minutes Intravenous Every 24 hours 01/24/22 2041     01/24/22 2230  meropenem (MERREM)  1 g in sodium chloride 0.9 % 100 mL IVPB        1 g 200 mL/hr over 30 Minutes Intravenous Every 8 hours 01/24/22 2134     01/24/22 1800  vancomycin (VANCOCIN) IVPB 1000 mg/200 mL premix        1,000 mg 200 mL/hr over 60 Minutes Intravenous  Once 01/24/22 1757 01/24/22 1952       Subjective: Many family members at bedside [place], he shakes my hand, diffucult to undersand at times  Objective: Vitals:   01/26/22 0031 01/26/22 0402 01/26/22 0811 01/26/22 0835  BP: (!) 107/52 (!) 103/54  (!) 102/56  Pulse: 72 83  88  Resp: '18 19  17  '$ Temp: 98.1 F (36.7 C) 98.2 F (36.8 C)  98.2 F (36.8 C)  TempSrc: Axillary Axillary  Oral  SpO2: 90% 94% 95% 98%  Weight:      Height:        Intake/Output Summary (Last 24 hours) at 01/26/2022 1510 Last data filed at 01/26/2022 1221 Gross per 24 hour  Intake 0 ml  Output 1250 ml  Net -1250 ml   [employer]  01/24/22 2324  Weight: 86.3 kg    Examination:  General: No acute distress. Cardiovascular: RRR Lungs: unlabored Abdomen: wound around g tube with clear fluid, necrotic drainage noted on dressing - TTP around wound, no fluctuance or crepitus appreciated, region of induration noted around wound Neurological: alert, difficult to understand, moving all extremities Extremities: no LEE  Data Reviewed: I have personally reviewed following labs and imaging studies  CBC: Recent Labs  Lab 01/20/22 0305 01/21/22 0604 01/24/22 1524 01/25/22 0106 01/25/22 1429 01/26/22 0037  WBC 8.8 8.5 6.4 6.2  --  4.1  NEUTROABS  --  6.2 4.8  --   --  2.9  HGB 8.4* 8.7* 7.7* 7.1* 7.6* 7.2*  HCT 26.6* 26.7* 25.8* 23.1* 25.1* 23.3*  MCV 99.6 98.2 102.8* 100.4*  --  100.4*  PLT 109* 107* 115* 107*  --  98*    Basic Metabolic Panel: Recent Labs  Lab 01/21/22 0604 01/24/22 1524 01/25/22 0106 01/26/22 0037  NA 133* 138 139 141  K 4.9 4.4 4.3 3.8  CL 103 103 108 109  CO2 '24 28 25 26  '$ GLUCOSE 165* 123* 118* 115*  BUN 26* 43* 39* 41*   CREATININE 1.00 1.09 1.09 1.06  CALCIUM 9.1 9.8 9.5 9.3  MG 1.9  --   --  1.9  PHOS  --   --   --  2.3*    GFR: Estimated Creatinine Clearance: 53.3 mL/min (by C-G formula based on SCr of 1.06 mg/dL).  Liver Function Tests: Recent Labs  Lab 01/24/22 1524 01/25/22 0106 01/26/22 0037  AST 154* 104* 113*  ALT 124* 107* 107*  ALKPHOS 142* 133* 127*  BILITOT 0.5 0.5 0.4  PROT 5.1* 4.8* 4.4*  ALBUMIN 1.8* 1.8* 1.6*    CBG: Recent Labs  Lab 01/21/22 1212 01/21/22 1512 01/25/22 2030 01/26/22 0612 01/26/22 0650  GLUCAP 167* 178* 133* 107* 110*     Recent Results (from the past 240 hour(s))  Urine Culture     Status: None   Collection Time: 01/17/22  8:54 AM   Specimen: Urine, Catheterized  Result Value Ref Range Status   Specimen Description URINE, CATHETERIZED  Final   Special Requests NONE  Final   Culture   Final    NO GROWTH Performed at [facility], [place] [address] [place], [place] [address]    Report Status 01/18/2022 FINAL  Final  Urine Culture     Status: Abnormal (Preliminary result)   Collection Time: 01/24/22  3:12 PM   Specimen: Urine, Clean Catch  Result Value Ref Range Status   Specimen Description URINE, CLEAN CATCH  Final   Special Requests NONE  Final   Culture (Delta Deshmukh)  Final    40,000 COLONIES/mL ENTEROCOCCUS FAECIUM SUSCEPTIBILITIES TO FOLLOW Performed at [facility], [place] [address], [place], [place] [address]    Report Status PENDING  Incomplete  Blood culture (routine x 2)     Status: None (Preliminary result)   Collection Time: 01/24/22  3:24 PM   Specimen: BLOOD LEFT HAND  Result Value Ref Range Status   Specimen Description BLOOD LEFT HAND  Final   Special Requests   Final    BOTTLES DRAWN AEROBIC AND ANAEROBIC Blood Culture adequate volume   Culture   Final    NO GROWTH 2 DAYS Performed at [facility], [place] [address], [place], [place] [address]    Report Status PENDING  Incomplete  Blood culture (routine x 2)  Status: None (Preliminary result)   Collection Time: 01/24/22  3:24 PM   Specimen: BLOOD RIGHT HAND  Result Value Ref Range Status   Specimen Description BLOOD RIGHT HAND  Final   Special Requests   Final    BOTTLES DRAWN AEROBIC AND ANAEROBIC Blood Culture adequate volume   Culture   Final    NO GROWTH 2 DAYS Performed at [facility], [address], [place], [place] [address]    Report Status PENDING  Incomplete  Gastrointestinal Panel by PCR , Stool     Status: None   Collection Time: 01/24/22  9:08 PM   Specimen: Peg Site; Stool  Result Value Ref Range Status   Campylobacter species NOT DETECTED NOT DETECTED Final   Plesimonas shigelloides NOT DETECTED NOT DETECTED Final   Salmonella species NOT DETECTED NOT DETECTED Final   Yersinia enterocolitica NOT DETECTED NOT DETECTED Final   Vibrio species NOT DETECTED NOT DETECTED Final   Vibrio cholerae NOT DETECTED NOT DETECTED Final   Enteroaggregative E coli (EAEC) NOT DETECTED NOT DETECTED Final   Enteropathogenic E coli (EPEC) NOT DETECTED NOT DETECTED Final   Enterotoxigenic E coli (ETEC) NOT DETECTED NOT DETECTED Final   Shiga like toxin producing E coli (STEC) NOT DETECTED NOT DETECTED Final   Shigella/Enteroinvasive E coli (EIEC) NOT DETECTED NOT DETECTED Final   Cryptosporidium NOT DETECTED NOT DETECTED Final   Cyclospora cayetanensis NOT DETECTED NOT DETECTED Final   Entamoeba histolytica NOT DETECTED NOT DETECTED Final   Giardia lamblia NOT DETECTED NOT DETECTED Final   Adenovirus F40/41 NOT DETECTED NOT DETECTED Final   Astrovirus NOT DETECTED NOT DETECTED Final   Norovirus GI/GII NOT DETECTED NOT DETECTED Final   Rotavirus Yazeed Pryer NOT DETECTED NOT DETECTED Final   Sapovirus (I, II, IV, and V) NOT DETECTED NOT DETECTED Final    Comment: Performed at [facility], [place] [place], [place] [address]  C Difficile Quick Screen w PCR reflex     Status: None   Collection Time: 01/24/22  9:08 PM    Specimen: Peg Site; Stool  Result Value Ref Range Status   C Diff antigen NEGATIVE NEGATIVE Final   C Diff toxin NEGATIVE NEGATIVE Final   C Diff interpretation No C. difficile detected.  Final    Comment: Performed at [facility], [place] [address], [place], [place] [address]  Aerobic Culture w Gram Stain (superficial specimen)     Status: None (Preliminary result)   Collection Time: 01/24/22 10:07 PM   Specimen: Wound  Result Value Ref Range Status   Specimen Description WOUND  Final   Special Requests PEG SITE  Final   Gram Stain   Final    NO SQUAMOUS EPITHELIAL CELLS SEEN FEW WBC SEEN FEW GRAM POSITIVE COCCI ABUNDANT GRAM NEGATIVE RODS    Culture   Final    MODERATE STAPHYLOCOCCUS EPIDERMIDIS CULTURE REINCUBATED FOR BETTER GROWTH SUSCEPTIBILITIES TO FOLLOW Performed at [facility], [place] [address], [place], [place] [address]    Report Status PENDING  Incomplete         Radiology Studies: CT ABDOMEN PELVIS W CONTRAST  Result Date: 01/24/2022 CLINICAL DATA:  Abdominal pain and distention purulent discharge at the site of gastrostomy placement EXAM: CT ABDOMEN AND PELVIS WITH CONTRAST TECHNIQUE: Multidetector CT imaging of the abdomen and pelvis was performed using the standard protocol following bolus administration of intravenous contrast. RADIATION DOSE REDUCTION: This exam was performed according to the departmental dose-optimization program  which includes automated exposure control, adjustment of the mA and/or kV according to patient size and/or use of iterative reconstruction technique. CONTRAST:  178m OMNIPAQUE IOHEXOL 300 MG/ML  SOLN COMPARISON:  12/11/2021 FINDINGS: Lower chest: Heart is enlarged in size. Coronary artery calcifications are seen. There are metallic sutures in the sternum. Pacemaker leads are noted in the heart. Hepatobiliary: No focal abnormality is seen in the liver. There is slight prominence of intrahepatic bile ducts. Surgical clips are seen  in gallbladder fossa. There is no significant dilation of extrahepatic bile ducts. Pancreas: No focal abnormality is seen. Spleen: Spleen measures 15.9 cm in maximum diameter. Adrenals/Urinary Tract: Adrenals are unremarkable. There is no hydronephrosis. Bilateral renal cysts are seen. Largest of the cysts is seen in the anterior upper pole of right kidney measuring 7.1 cm. Some of the low-density lesions are subcentimeter in size limiting characterization. There are no renal or ureteral stones. Foley catheter is seen in the bladder. There is subtle increased density in the dependent portion of urinary bladder. Stomach/Bowel: Small hiatal hernia is seen. Stomach is not distended. There is interval placement of gastrostomy. Balloon in the gastrostomy tube is noted projecting in the anterior margin of the gastric wall. There is mild stranding in the perigastric region at the site of gastrostomy. There is 6.4 x 2.2 cm area of inflammatory phlegmon in the subcutaneous plane at the site of gastrostomy placement. There are small pockets of air in the subcutaneous plane at the site of gastrostomy. This may be due to open wound in the skin or suggest infectious process in the subcutaneous plane. There is no definite demonstrable thick-walled fluid collection. Small bowel loops are not dilated. Appendix is not dilated. There is no significant wall thickening in colon. Vascular/Lymphatic: There are scattered arterial calcifications. There is seen low-density in the infrarenal abdominal aorta, residual change from aortobifemoral bypass graft. The stent appears to be patent. There is ectasia of left common iliac artery measuring 2.1 cm in the AP diameter. Reproductiveprostate is not enlarged. Please correlate for possible previous intervention in the prostate. Other: There is no ascites or pneumoperitoneum. There is ventral hernia containing fat to the left of midline above the level of umbilicus. Bilateral inguinal hernias  containing fat are seen, larger on the right side. Musculoskeletal: Degenerative changes are noted in the lumbar spine. Healing fractures are seen in the right ala of sacrum posterior right iliac bone close to the SI joint right superior pubic ramus and right inferior pubic ramus. Mixed attenuation seen in the body of T12 vertebra as not changed. IMPRESSION: There is 6.4 x 2.2 cm area of inflammatory/infectious phlegmon in the subcutaneous plane at the site of placement of gastrostomy. There are pockets of air in the subcutaneous plane at the site gastrostomy tube placement. This may be due to open wound in the skin or suggest infection with gas producing organisms. There is no definite demonstrable thick-walled loculated fluid collection at the site of gastrostomy tube placement. Balloon in the tip of gastrostomy tube is projecting immediately anterior to the anterior wall of the stomach. Possibility of inflated balloon in the gastrostomy tube to be lying immediately anterior to the gastric wall is not excluded. There is no evidence of pneumoperitoneum or significant ascites. There is no evidence of intestinal obstruction or pneumoperitoneum. Appendix is not dilated. There is no hydronephrosis. Appendix is not dilated. Small hiatal hernia. Coronary artery disease. Bilateral renal cysts. Healing fractures are seen in the right ala of sacrum, right iliac bone,  right superior pubic ramus and right inferior pubic ramus. Other findings as described in the body of the report. Electronically Signed   By: Elmer Picker M.D.   On: 01/24/2022 17:26   IR Replc Gastro/Colonic Tube Percut W/Fluoro  Result Date: 01/25/2022 INDICATION: Leaking gastrostomy tube. Concern for abscess surrounding subcutaneous track of gastrostomy tube Request made for fluoroscopic guided gastrostomy tube exchange with potential up sizing. EXAM: FLUOROSCOPIC GUIDED REPLACEMENT OF GASTROSTOMY TUBE COMPARISON:  Image guided percutaneous  gastrostomy tube placement-01/15/2022; CT abdomen pelvis-01/24/2022 MEDICATIONS: None. CONTRAST:  75m OMNIPAQUE IOHEXOL 300 MG/ML SOLN - administered into the gastric lumen FLUOROSCOPY TIME:  1 minute, 6 seconds (10 mGy) COMPLICATIONS: None immediate. PROCEDURE: Daymian Lill timeout was performed prior to the initiation of the procedure. The upper abdomen and external portion of the existing gastrostomy tube was prepped and draped in the usual sterile fashion, and Anthoney Sheppard sterile drape was applied covering the operative field. Maximum barrier sterile technique with sterile gowns and gloves were used for the procedure. Nayshawn Mesta timeout was performed prior to the initiation of the procedure. The existing gastrostomy tube was injected with Gianpaolo Mindel small amount of contrast confirming appropriate positioning within the gastric lumen. The external portion of the gastrostomy tube was trimmed and cannulated with Dameisha Tschida short Amplatz wire was coiled within the gastric lumen to the level of the gastric fundus. Next, utilizing gentle traction, the disc retention gastrostomy tube was removed intact. Next, the track was serially dilated ultimately allowing placement of Iasia Forcier new 24 French balloon inflatable gastrostomy tube. The balloon was inflated with approximately 20 cc of saline and dilute contrast and pulled against the anterior interval of the stomach. The external disc was gently cinched at the skin entrance surface site. Contrast injection demonstrated appropriate positioning and functionality of the new, slightly larger gastrostomy tube. Next, time was spent debriding the apparent pressure ulcer about the medial aspect of the gastrostomy tube entrance site with expression of Bailley Guilford moderate amount of purulent fluid undermining the adjacent subcutaneous tissues. Dressings were applied. The patient tolerated the procedure well without immediate postprocedural complication. IMPRESSION: Successful fluoroscopic guided exchange and up sizing of now 24 French  gastrostomy tube. The gastrostomy tube is ready for immediate use. Given apparent pressure ulcer adjacent to the medial aspect of the gastrostomy tube insertion site, wound care consultation may be performed as indicated. Electronically Signed   By: JSandi MariscalM.D.   On: 01/25/2022 14:19   DG ABDOMEN PEG TUBE LOCATION  Result Date: 01/26/2022 CLINICAL DATA:  88year old male with feeding tube dysfunction. Peg tube contrast injection. EXAM: ABDOMEN - 1 VIEW COMPARISON:  CT Abdomen and Pelvis 01/24/2022. FINDINGS: Portable AP supine view at 0420 hours. Injected contrast via percutaneous gastrostomy tube outlines the gastric fundus, other gastric mucosa, and proximal duodenum with no abnormal pooling or extravasation identified. Retained contrast mixed with stool again noted in the right colon. Non obstructed bowel gas pattern. Stable lung bases. Cardiac pacemaker leads. Stable cholecystectomy clips. Bulky spinal endplate osteophytosis. No acute osseous abnormality identified. IMPRESSION: No extravasation or adverse features identified on contrast injection of gastrostomy tube. Electronically Signed   By: HGenevie AnnM.D.   On: 01/26/2022 04:47        Scheduled Meds:  acetaminophen  1,000 mg Per Tube Q8H   amiodarone  200 mg Per Tube Daily   apixaban  5 mg Per Tube BID   Chlorhexidine Gluconate Cloth  6 each Topical Q0600   famotidine  20 mg Per Tube BID  feeding supplement (KATE FARMS STANDARD 1.4)  325 mL Per Tube 5 X Daily   [START ON 01/27/2022] fluconazole  100 mg Per Tube Daily   fluticasone furoate-vilanterol  1 puff Inhalation Daily   free water  120 mL Per Tube 5 X Daily   mirtazapine  7.5 mg Per Tube QHS   nutrition supplement (JUVEN)  1 packet Per Tube BID BM   venlafaxine  18.75 mg Per Tube BID WC   Continuous Infusions:  dextrose 5% lactated ringers     meropenem (MERREM) IV 1 g (01/26/22 0606)   vancomycin 1,000 mg (01/25/22 1757)     LOS: 2 days    Time spent: over 30  min    Fayrene Helper, MD [facility]   To contact the attending provider between 7A-7P or the covering provider during after hours 7P-7A, please log into the web site [email or web address] and access using universal [place] password for that web site. If you do not have the password, please call the hospital operator.  01/26/2022, 3:10 PM

## 2022-01-26 NOTE — Consult Note (Signed)
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 Skin: No major subcutaneous nodules. Warm and dry   Assessment/Plan: 88 yo male with pressure wound related to g tube Place g tube to gravity and ensure no pressure of the wafer on the skin. Also ensure balloon is not pulled up against the intraabdominal wall Zinc oxide to intact portion of epidermis If g tube to gravity is not able to control drainage, he may benefit from post-pyloric small bore feeding tube to allow enteric nutrition downstream of this wound  I reviewed last 24 h vitals and pain scores, last 48 h intake and output, last 24 h labs and trends, and last 24 h imaging results.  This care required high  level of medical decision making.   Arta Bruce Nilda Keathley 01/26/2022, 3:57 PM

## 2022-01-26 NOTE — Progress Notes (Signed)
PEG still unable to use at this time.  Large volume feed, flushes, and medications on hold.  MD attending aware.

## 2022-01-26 NOTE — Progress Notes (Addendum)
With order to place PEG to gravity drain.  Hooked up PEG to standard drainage bag.  PEG tube anchored with a tape supported by a gauze dressing to prevent from pulling/tagging.  Noted with scant brown drainage at this time.

## 2022-01-27 ENCOUNTER — Inpatient Hospital Stay (HOSPITAL_COMMUNITY): Payer: Medicare Other

## 2022-01-27 DIAGNOSIS — Z7189 Other specified counseling: Secondary | ICD-10-CM

## 2022-01-27 DIAGNOSIS — R008 Other abnormalities of heart beat: Secondary | ICD-10-CM | POA: Diagnosis not present

## 2022-01-27 DIAGNOSIS — R410 Disorientation, unspecified: Secondary | ICD-10-CM

## 2022-01-27 LAB — URINE CULTURE: Culture: 40000 — AB

## 2022-01-27 LAB — CBC
HCT: 23 % — ABNORMAL LOW (ref 39.0–52.0)
Hemoglobin: 7.4 g/dL — ABNORMAL LOW (ref 13.0–17.0)
MCH: 31.6 pg (ref 26.0–34.0)
MCHC: 32.2 g/dL (ref 30.0–36.0)
MCV: 98.3 fL (ref 80.0–100.0)
Platelets: 103 10*3/uL — ABNORMAL LOW (ref 150–400)
RBC: 2.34 MIL/uL — ABNORMAL LOW (ref 4.22–5.81)
RDW: 18.7 % — ABNORMAL HIGH (ref 11.5–15.5)
WBC: 4.5 10*3/uL (ref 4.0–10.5)
nRBC: 0 % (ref 0.0–0.2)

## 2022-01-27 LAB — AEROBIC CULTURE W GRAM STAIN (SUPERFICIAL SPECIMEN): Gram Stain: NONE SEEN

## 2022-01-27 LAB — BASIC METABOLIC PANEL
Anion gap: 6 (ref 5–15)
BUN: 32 mg/dL — ABNORMAL HIGH (ref 8–23)
CO2: 25 mmol/L (ref 22–32)
Calcium: 9.2 mg/dL (ref 8.9–10.3)
Chloride: 112 mmol/L — ABNORMAL HIGH (ref 98–111)
Creatinine, Ser: 0.99 mg/dL (ref 0.61–1.24)
GFR, Estimated: >60 mL/min (ref >60)
Glucose, Bld: 108 mg/dL — ABNORMAL HIGH (ref 70–99)
Potassium: 3.7 mmol/L (ref 3.5–5.1)
Sodium: 143 mmol/L (ref 135–145)

## 2022-01-27 LAB — ECHOCARDIOGRAM COMPLETE
Area-P 1/2: 3.7 cm2
Height: 71 in
P 1/2 time: 561 msec
S' Lateral: 4.6 cm
Weight: 3044.11 oz

## 2022-01-27 LAB — GLUCOSE, CAPILLARY
Glucose-Capillary: 103 mg/dL — ABNORMAL HIGH (ref 70–99)
Glucose-Capillary: 111 mg/dL — ABNORMAL HIGH (ref 70–99)
Glucose-Capillary: 115 mg/dL — ABNORMAL HIGH (ref 70–99)
Glucose-Capillary: 122 mg/dL — ABNORMAL HIGH (ref 70–99)

## 2022-01-27 LAB — HEPATIC FUNCTION PANEL
ALT: 92 U/L — ABNORMAL HIGH (ref 0–44)
AST: 83 U/L — ABNORMAL HIGH (ref 15–41)
Albumin: 1.6 g/dL — ABNORMAL LOW (ref 3.5–5.0)
Alkaline Phosphatase: 112 U/L (ref 38–126)
Bilirubin, Direct: 0.2 mg/dL (ref 0.0–0.2)
Indirect Bilirubin: 0.6 mg/dL (ref 0.3–0.9)
Total Bilirubin: 0.8 mg/dL (ref 0.3–1.2)
Total Protein: 4.2 g/dL — ABNORMAL LOW (ref 6.5–8.1)

## 2022-01-27 MED ORDER — CEFAZOLIN SODIUM-DEXTROSE 2-4 GM/100ML-% IV SOLN
2.0000 g | Freq: Three times a day (TID) | INTRAVENOUS | Status: DC
Start: 2022-01-27 — End: 2022-01-30
  Administered 2022-01-27 – 2022-01-29 (×8): 2 g via INTRAVENOUS
  Filled 2022-01-27 (×8): qty 100

## 2022-01-27 MED ORDER — SODIUM CHLORIDE 0.9 % IV SOLN
2.0000 g | INTRAVENOUS | Status: DC
  Administered 2022-01-27: 2 g via INTRAVENOUS
  Filled 2022-01-27: qty 20

## 2022-01-27 MED ORDER — MEDIHONEY WOUND/BURN DRESSING EX PSTE
1.0000 "application " | PASTE | Freq: Two times a day (BID) | CUTANEOUS | Status: DC
  Administered 2022-01-27 – 2022-02-09 (×24): 1 via TOPICAL
  Filled 2022-01-27 (×2): qty 44

## 2022-01-27 MED ORDER — DOXAZOSIN MESYLATE 2 MG PO TABS
2.0000 mg | ORAL_TABLET | Freq: Every day | ORAL | Status: DC
  Filled 2022-01-27 (×2): qty 1

## 2022-01-27 NOTE — Progress Notes (Addendum)
Subjective: CC: Denies any abdominal pain  Objective: Vital signs in last 24 hours: Temp:  [97.7 F (36.5 C)-98.9 F (37.2 C)] 98.9 F (37.2 C) (06/05 0743) Pulse Rate:  [71-88] 72 (06/05 0743) Resp:  [17-20] 17 (06/05 0743) BP: (102-158)/(49-145) 132/63 (06/05 0743) SpO2:  [93 %-100 %] 96 % (06/05 0743) Last BM Date : 01/26/22  Intake/Output from previous day: 06/04 0701 - 06/05 0700 In: 1103.2 [I.V.:403.2; IV Piggyback:700] Out: 250 [Urine:250] Intake/Output this shift: No intake/output data recorded.  PE: Gen:  Alert, NAD, pleasant Abd: Soft, ND, NT. No rigidity or guarding. G-tube present with medial full thickness wound as seen in prior pictures. After cleaning the wound there does not appear to be active drainage. Phlange pulled back. Surrounding skin erythema appears to be improving with some very faint blanchable erythema on the medial aspect of the abdomen as seen in picture below. G-tube to gravity with bilious output in bag.        Lab Results:  Recent Labs    01/26/22 0037 01/27/22 0313  WBC 4.1 4.5  HGB 7.2* 7.4*  HCT 23.3* 23.0*  PLT 98* 103*   BMET Recent Labs    01/26/22 0037 01/27/22 0313  NA 141 143  K 3.8 3.7  CL 109 112*  CO2 26 25  GLUCOSE 115* 108*  BUN 41* 32*  CREATININE 1.06 0.99  CALCIUM 9.3 9.2   PT/INR No results for input(s): LABPROT, INR in the last 72 hours. CMP     Component Value Date/Time   NA 143 01/27/2022 0313   NA 142 10/15/2021 0927   K 3.7 01/27/2022 0313   CL 112 (H) 01/27/2022 0313   CO2 25 01/27/2022 0313   GLUCOSE 108 (H) 01/27/2022 0313   BUN 32 (H) 01/27/2022 0313   BUN 24 10/15/2021 0927   CREATININE 0.99 01/27/2022 0313   CREATININE 0.92 03/10/2013 0810   CALCIUM 9.2 01/27/2022 0313   PROT 4.2 (L) 01/27/2022 0313   PROT 6.1 07/01/2021 1523   ALBUMIN 1.6 (L) 01/27/2022 0313   ALBUMIN 3.8 10/15/2021 0927   AST 83 (H) 01/27/2022 0313   ALT 92 (H) 01/27/2022 0313   ALKPHOS 112  01/27/2022 0313   BILITOT 0.8 01/27/2022 0313   BILITOT 0.9 07/01/2021 1523   GFRNONAA >60 01/27/2022 0313   GFRNONAA 79 03/10/2013 0810   GFRAA 58 (L) 10/05/2020 0950   GFRAA >89 03/10/2013 0810   Lipase     Component Value Date/Time   LIPASE 70 (H) 01/24/2022 1524    Studies/Results: IR Replc Gastro/Colonic Tube Percut W/Fluoro  Result Date: 01/25/2022 INDICATION: Leaking gastrostomy tube. Concern for abscess surrounding subcutaneous track of gastrostomy tube Request made for fluoroscopic guided gastrostomy tube exchange with potential up sizing. EXAM: FLUOROSCOPIC GUIDED REPLACEMENT OF GASTROSTOMY TUBE COMPARISON:  Image guided percutaneous gastrostomy tube placement-01/15/2022; CT abdomen pelvis-01/24/2022 MEDICATIONS: None. CONTRAST:  44m OMNIPAQUE IOHEXOL 300 MG/ML SOLN - administered into the gastric lumen FLUOROSCOPY TIME:  1 minute, 6 seconds (10 mGy) COMPLICATIONS: None immediate. PROCEDURE: A timeout was performed prior to the initiation of the procedure. The upper abdomen and external portion of the existing gastrostomy tube was prepped and draped in the usual sterile fashion, and a sterile drape was applied covering the operative field. Maximum barrier sterile technique with sterile gowns and gloves were used for the procedure. A timeout was performed prior to the initiation of the procedure. The existing gastrostomy tube was injected with a small amount of  contrast confirming appropriate positioning within the gastric lumen. The external portion of the gastrostomy tube was trimmed and cannulated with a short Amplatz wire was coiled within the gastric lumen to the level of the gastric fundus. Next, utilizing gentle traction, the disc retention gastrostomy tube was removed intact. Next, the track was serially dilated ultimately allowing placement of a new 24 French balloon inflatable gastrostomy tube. The balloon was inflated with approximately 20 cc of saline and dilute contrast and  pulled against the anterior interval of the stomach. The external disc was gently cinched at the skin entrance surface site. Contrast injection demonstrated appropriate positioning and functionality of the new, slightly larger gastrostomy tube. Next, time was spent debriding the apparent pressure ulcer about the medial aspect of the gastrostomy tube entrance site with expression of a moderate amount of purulent fluid undermining the adjacent subcutaneous tissues. Dressings were applied. The patient tolerated the procedure well without immediate postprocedural complication. IMPRESSION: Successful fluoroscopic guided exchange and up sizing of now 24 French gastrostomy tube. The gastrostomy tube is ready for immediate use. Given apparent pressure ulcer adjacent to the medial aspect of the gastrostomy tube insertion site, wound care consultation may be performed as indicated. Electronically Signed   By: Sandi Mariscal M.D.   On: 01/25/2022 14:19   DG ABDOMEN PEG TUBE LOCATION  Result Date: 01/26/2022 CLINICAL DATA:  88 year old male with feeding tube dysfunction. Peg tube contrast injection. EXAM: ABDOMEN - 1 VIEW COMPARISON:  CT Abdomen and Pelvis 01/24/2022. FINDINGS: Portable AP supine view at 0420 hours. Injected contrast via percutaneous gastrostomy tube outlines the gastric fundus, other gastric mucosa, and proximal duodenum with no abnormal pooling or extravasation identified. Retained contrast mixed with stool again noted in the right colon. Non obstructed bowel gas pattern. Stable lung bases. Cardiac pacemaker leads. Stable cholecystectomy clips. Bulky spinal endplate osteophytosis. No acute osseous abnormality identified. IMPRESSION: No extravasation or adverse features identified on contrast injection of gastrostomy tube. Electronically Signed   By: Genevie Ann M.D.   On: 01/26/2022 04:47    Anti-infectives: Anti-infectives (From admission, onward)    Start     Dose/Rate Route Frequency Ordered Stop    01/27/22 1000  fluconazole (DIFLUCAN) tablet 100 mg        100 mg Per Tube Daily 01/26/22 0353 02/02/22 0959   01/26/22 1000  fluconazole (DIFLUCAN) 40 MG/ML suspension 100 mg  Status:  Discontinued       See Hyperspace for full Linked Orders Report.   100 mg Per Tube Daily 01/25/22 1833 01/26/22 0353   01/26/22 0500  fluconazole (DIFLUCAN) IVPB 200 mg        200 mg 100 mL/hr over 60 Minutes Intravenous  Once 01/26/22 0353 01/26/22 0533   01/25/22 1930  fluconazole (DIFLUCAN) 40 MG/ML suspension 200 mg  Status:  Discontinued       See Hyperspace for full Linked Orders Report.   200 mg Per Tube  Once 01/25/22 1833 01/26/22 0353   01/25/22 1800  vancomycin (VANCOCIN) IVPB 1000 mg/200 mL premix        1,000 mg 200 mL/hr over 60 Minutes Intravenous Every 24 hours 01/24/22 2041     01/24/22 2230  meropenem (MERREM) 1 g in sodium chloride 0.9 % 100 mL IVPB        1 g 200 mL/hr over 30 Minutes Intravenous Every 8 hours 01/24/22 2134     01/24/22 1800  vancomycin (VANCOCIN) IVPB 1000 mg/200 mL premix  1,000 mg 200 mL/hr over 60 Minutes Intravenous  Once 01/24/22 1757 01/24/22 1952        Assessment/Plan Pressure Wound Related to G-tube - S/p IR upsize g-tube 6/3 - Would keep G-tube to gravity to ensure no pressure of the phlange on the skin. Also ensure balloon is not pulled up against the intraabdominal wall. Could place post pyloric feeding tube for nutrition  - Cont current wound care w/ zinc oxide to intact portion of the epidermis. WOCN to consult for further recommendations of wound care.  - Can de-escalate abx from our standpoint   LOS: 3 days    Jillyn Ledger , [facility] Surgery 01/27/2022, 8:15 AM Please see Amion for pager number during day hours 7:00am-4:30pm

## 2022-01-27 NOTE — Progress Notes (Signed)
SLP Cancellation Note  Patient Details Name: [name] MRN: [id number] DOB: 08/05/1933   Cancelled treatment:       Reason Eval/Treat Not Completed: Patient not medically ready. Order received for swallow eval. Note diet ordered entered but pt also ordered to have g-tube set to gravity. Discussed with surgical PA, Legrand Como, who requests we hold swallow eval at this time as he thinks pt needs more time before he is medically ready for anything to pass through the tube. He is considering temporary, post-pyloric feeds. SLP will f/u for swallow eval as able once medically ready. Discussed with pt/family, who are aware.    Osie Bond., M.A. [place] Office [phone]  Secure chat preferred  01/27/2022, 11:46 AM

## 2022-01-27 NOTE — Assessment & Plan Note (Addendum)
Delirium precautions -Improved.

## 2022-01-27 NOTE — Procedures (Signed)
Cortrak  Person Inserting Tube:  Maylon Peppers C, RD Tube Type:  Cortrak - 43 inches Tube Size:  10 Tube Location:  Left nare Secured by: Bridle Technique Used to Measure Tube Placement:  Marking at nare/corner of mouth Cortrak Secured At:  85 cm  Cortrak Tube Team Note:  Consult received to place a Cortrak feeding tube.   X-ray is required, abdominal x-ray has been ordered by the Cortrak team. Please confirm tube placement before using the Cortrak tube.   If the tube becomes dislodged please keep the tube and contact the Cortrak team at [email or web address] (password [email or web address]) for replacement.  If after hours and replacement cannot be delayed, place a NG tube and confirm placement with an abdominal x-ray.    Lockie Pares., RD, LDN, CNSC See AMiON for contact information

## 2022-01-27 NOTE — Consult Note (Signed)
WOC Nurse Consult Note: Patient receiving care in [place]. Consult completed remotely after review of record and images. Reason for Consult: wounds on buttocks and around PEG tube. Patient going to IR for G tube exchange today. It appears previous G tube bumper was too tight which caused necrosis of the skin. Wound type: Medical device related PI to G tube insertion site. Pt going for tube exchange. I will hold off on orders for existing site in order that it can be determined where the new tube will be placed. Multiple DTPIs to bilateral buttocks, see photo Pressure Injury POA: Yes Measurement: To be provided by the bedside RN in the flowsheet section  Wound bed: maroon DTPIs to bilateral buttocks Drainage (amount, consistency, odor) no drainage Periwound:intact Dressing procedure/placement/frequency: Place Xeroform gauze over the discolored areas on the buttocks, cover with foam dressings. Change both every 2 days and prn.  I am also adding an order for a low air loss mattress and turning instructions.  PLEASE RECONSULT the [place] team if needed once the G tube replacement has occurred. Thank you for the consult. [place] nurse will not follow at this time.  Please re-consult the [place] team if needed.  Val Riles, RN, MSN, CWOCN, CNS-BC, pager [phone]

## 2022-01-27 NOTE — Care Management Important Message (Signed)
Important Message  Patient Details  Name: CAMARI WISHAM MRN: [id number] Date of Birth: 06-Mar-1933   Medicare Important Message Given:  Yes   Patient has a Contact precaution order in place will mail this document to the patient home address.   Mckinna Demars 01/27/2022, 3:32 PM

## 2022-01-27 NOTE — Progress Notes (Signed)
  Echocardiogram 2D Echocardiogram has been performed.  [name] M 01/27/2022, 9:23 AM

## 2022-01-27 NOTE — Assessment & Plan Note (Addendum)
Not currently interested in palliative care discussions at this time.

## 2022-01-27 NOTE — TOC Initial Note (Addendum)
Transition of Care [facility]) - Initial/Assessment Note    Patient Details  Name: [name] MRN: [id number] Date of Birth: 07-02-1933  Transition of [facility]) CM/SW Contact:    Marilu Favre, RN Phone Number: 01/27/2022, 2:39 PM  Clinical Narrative:                 Spoke to daughter [name] at bedside. Patient from home with family. Confirmed face sheet information.   Active with [facility]. Confirmed with Tommi Rumps with [facility], will need resumption of care orders.   Patient has hospital bed , shower seat and tube feedings through [place]. PAtient has two cases of Osmolyte 1.5 at home, feeding changing to [facility] Standard 1.4 , 325 mL 5 times daily. [name] asking if she can return Osmolyte and if [employer] has [employer]. NCM called [place] spoke to [place] , she will have someone call [name] directly regarding if tube feeding can be returned, they do carry Dillard [place] of course will need order with nutritional information.   Patient will need PTAR home at discharge. Confirmed address  Expected Discharge Plan: [place] Barriers to Discharge: Continued Medical Work up   Patient Goals and CMS Choice Patient states their goals for this hospitalization and ongoing recovery are:: to return to home CMS Medicare.gov Compare Post Acute Care list provided to:: Patient Represenative (must comment) (daughter [name]) Choice offered to / list presented to : Adult Children  Expected Discharge Plan and Services Expected Discharge Plan: [place]   Discharge Planning Services: CM Consult Post Acute Care Choice: Home Health Living arrangements for the past 2 months: Single Family Home                 DME Arranged: Tube feeding DME Agency: [facility] Date DME Agency Contacted: 01/27/22 Time DME Agency Contacted: [id number] Representative spoke with at DME Agency: Letta Kocher await new orders HH Arranged: RN, PT [place] Agency: [place] Date  [place]: 01/27/22 Time [place]: 41 Representative spoke with at [place]  Prior Living Arrangements/Services Living arrangements for the past 2 months: [place] with:: Spouse, Adult Children Patient language and need for interpreter reviewed:: Yes Do you feel safe going back to the place where you live?: Yes      Need for Family Participation in Patient Care: Yes (Comment) Care giver support system in place?: Yes (comment) Current home services: DME Criminal Activity/Legal Involvement Pertinent to Current Situation/Hospitalization: No - Comment as needed  Activities of Daily Living      Permission Sought/Granted      Share Information with NAME: daughter [name]  Permission granted to share info w AGENCY: [facility]        Emotional Assessment Appearance:: Appears stated age            Admission diagnosis:  Cellulitis of abdominal wall [L03.311] Cellulitis [L03.90] Patient Active Problem List   Diagnosis Date Noted   Goals of care, counseling/discussion 01/27/2022   Delirium 01/27/2022   Pressure ulcer 01/25/2022   Anemia 01/25/2022   Cellulitis 01/24/2022   Dysphagia 01/24/2022   Status post insertion of percutaneous endoscopic gastrostomy (PEG) tube ([place]) 01/24/2022   Urinary retention 01/24/2022   Hypotension 01/24/2022   Diarrhea 01/24/2022   Abnormal LFTs 01/24/2022   Protein-calorie malnutrition, severe 01/07/2022   Acute encephalopathy 01/06/2022   Acute kidney injury superimposed on chronic kidney disease ([place]) 01/06/2022   C6 cervical fracture (  [place]) 01/06/2022   History of pelvic hematoma 01/06/2022   Dysarthria 01/06/2022   Closed fracture of multiple pubic rami, right, sequela 81/44/8185   Complicated UTI (urinary tract infection) 06/23/2021   HFrEF (heart failure with reduced ejection fraction) ([place]) 06/23/2021   AKI (acute kidney injury) ([place]) 06/23/2021   COVID-19 virus RNA test result positive at  limit of detection 02/01/2021   Kidney stones 07/04/2020   Hyperlipidemia associated with type 2 diabetes mellitus ([place]) 07/05/2019   Paroxysmal atrial fibrillation ([place]) 06/29/2019   Acquired thrombophilia ([place]) 06/29/2019   Second degree Mobitz II AV block 02/22/2019   B12 deficiency 12/18/2017   Status post right knee replacement 11/10/2016   S/P repair of abdominal aortic aneurysm using bifurcation graft 10/03/2016   Mixed incontinence 04/03/2015   PVC's (premature ventricular contractions) 06/05/2014   BMI 29.0-29.9,adult 63/14/9702   Diastolic dysfunction- grade 1 by echo June 2015, EF 50% 03/28/2014   COPD (chronic obstructive pulmonary disease) ([place]) 02/21/2014   OSA (obstructive sleep apnea) 12/07/2013   Sinus bradycardia- ? symptomatic 05/17/2013   Coronary artery disease involving nonautologous biological coronary bypass graft without angina pectoris    Thrombocytopenia (HCC)    S/P CABG x 3 08/21/2011   Hypertensive cardiovascular disease    Prostate cancer ([place])    Hyperlipidemia with target LDL less than 100    Controlled type 2 diabetes mellitus without complication, without long-term current use of insulin ([place]) 11/28/2010   PCP:  [name], DO Pharmacy:   [id number] [place]Phone: [phone]: [phone] [facility] - [place] [address]Phone: [phone]: [phone] [facility] [place]- [address]Phone: [phone]: [phone]    Social Determinants of Health (SDOH) Interventions    Readmission Risk Interventions    01/16/2022    2:12 PM  Readmission Risk Prevention Plan  Transportation Screening Complete  Medication Review (RDumas Referral to Pharmacy   PCP or Specialist appointment within 3-5 days of discharge Complete  HRI or [place] Water[place]  SW Recovery Care/Counseling Consult Complete  Palliative Care Screening Complete  [place] Applicable

## 2022-01-27 NOTE — Consult Note (Signed)
   [facility] CM Inpatient Consult   01/27/2022  [name] [birth year] [id number]  [place] [facility] [ACO] Patient: [name]  Readmission less than 7 days noted with extreme high risk score for unplanned readmission risk  Primary Care Provider:  [name], DO, [place], is an embedded provider with a Chronic Care Management team and program, and is listed for the transition of care follow up and appointments.  Patient was screened for Embedded practice service needs for chronic care management  Plan: Continue to follow for disposition needs and send notification to the [place] Management  team to make aware of TOC needs for post hospital needs, when appropraite.  Please contact for further questions,  Natividad Brood, RN BSN [facility] Liaison  [phone] business mobile phone Toll free office [phone]  Fax number: [phone] [email or web address]

## 2022-01-28 ENCOUNTER — Ambulatory Visit: Payer: Medicare Other | Admitting: Family Medicine

## 2022-01-28 DIAGNOSIS — L03818 Cellulitis of other sites: Secondary | ICD-10-CM | POA: Diagnosis not present

## 2022-01-28 DIAGNOSIS — E870 Hyperosmolality and hypernatremia: Secondary | ICD-10-CM

## 2022-01-28 LAB — CBC WITH DIFFERENTIAL/PLATELET
Abs Immature Granulocytes: 0.22 10*3/uL — ABNORMAL HIGH (ref 0.00–0.07)
Basophils Absolute: 0 10*3/uL (ref 0.0–0.1)
Basophils Relative: 1 %
Eosinophils Absolute: 0.3 10*3/uL (ref 0.0–0.5)
Eosinophils Relative: 5 %
HCT: 25.4 % — ABNORMAL LOW (ref 39.0–52.0)
Hemoglobin: 7.7 g/dL — ABNORMAL LOW (ref 13.0–17.0)
Immature Granulocytes: 4 %
Lymphocytes Relative: 24 %
Lymphs Abs: 1.4 10*3/uL (ref 0.7–4.0)
MCH: 30.3 pg (ref 26.0–34.0)
MCHC: 30.3 g/dL (ref 30.0–36.0)
MCV: 100 fL (ref 80.0–100.0)
Monocytes Absolute: 0.7 10*3/uL (ref 0.1–1.0)
Monocytes Relative: 12 %
Neutro Abs: 3.3 10*3/uL (ref 1.7–7.7)
Neutrophils Relative %: 54 %
Platelets: 113 10*3/uL — ABNORMAL LOW (ref 150–400)
RBC: 2.54 MIL/uL — ABNORMAL LOW (ref 4.22–5.81)
RDW: 18.6 % — ABNORMAL HIGH (ref 11.5–15.5)
WBC: 5.9 10*3/uL (ref 4.0–10.5)
nRBC: 0 % (ref 0.0–0.2)

## 2022-01-28 LAB — BASIC METABOLIC PANEL
Anion gap: 11 (ref 5–15)
BUN: 27 mg/dL — ABNORMAL HIGH (ref 8–23)
CO2: 23 mmol/L (ref 22–32)
Calcium: 9.3 mg/dL (ref 8.9–10.3)
Chloride: 112 mmol/L — ABNORMAL HIGH (ref 98–111)
Creatinine, Ser: 1.05 mg/dL (ref 0.61–1.24)
GFR, Estimated: >60 mL/min (ref >60)
Glucose, Bld: 127 mg/dL — ABNORMAL HIGH (ref 70–99)
Potassium: 3.4 mmol/L — ABNORMAL LOW (ref 3.5–5.1)
Sodium: 146 mmol/L — ABNORMAL HIGH (ref 135–145)

## 2022-01-28 LAB — GLUCOSE, CAPILLARY
Glucose-Capillary: 98 mg/dL (ref 70–99)
Glucose-Capillary: 144 mg/dL — ABNORMAL HIGH (ref 70–99)
Glucose-Capillary: 154 mg/dL — ABNORMAL HIGH (ref 70–99)

## 2022-01-28 LAB — HEPATIC FUNCTION PANEL
ALT: 70 U/L — ABNORMAL HIGH (ref 0–44)
AST: 77 U/L — ABNORMAL HIGH (ref 15–41)
Albumin: 1.6 g/dL — ABNORMAL LOW (ref 3.5–5.0)
Alkaline Phosphatase: 119 U/L (ref 38–126)
Bilirubin, Direct: 0.2 mg/dL (ref 0.0–0.2)
Indirect Bilirubin: 0.4 mg/dL (ref 0.3–0.9)
Total Bilirubin: 0.6 mg/dL (ref 0.3–1.2)
Total Protein: 4.7 g/dL — ABNORMAL LOW (ref 6.5–8.1)

## 2022-01-28 LAB — MAGNESIUM: Magnesium: 2.1 mg/dL (ref 1.7–2.4)

## 2022-01-28 LAB — PHOSPHORUS: Phosphorus: 2.3 mg/dL — ABNORMAL LOW (ref 2.5–4.6)

## 2022-01-28 MED ORDER — ZINC OXIDE 12.8 % EX OINT
TOPICAL_OINTMENT | Freq: Two times a day (BID) | CUTANEOUS | Status: DC
  Administered 2022-02-05 – 2022-02-16 (×3): 1 via TOPICAL
  Filled 2022-01-28 (×4): qty 56.7

## 2022-01-28 MED ORDER — KATE FARMS STANDARD 1.4 PO LIQD
325.0000 mL | Freq: Three times a day (TID) | ORAL | Status: DC
  Administered 2022-01-28 – 2022-01-31 (×9): 325 mL
  Filled 2022-01-28 (×16): qty 325

## 2022-01-28 MED ORDER — DEXTROSE-NACL 5-0.45 % IV SOLN: INTRAVENOUS | Status: DC

## 2022-01-28 MED ORDER — K PHOS MONO-SOD PHOS DI & MONO 155-852-130 MG PO TABS
500.0000 mg | ORAL_TABLET | Freq: Four times a day (QID) | ORAL | Status: AC
  Administered 2022-01-28 – 2022-01-29 (×4): 500 mg
  Filled 2022-01-28 (×5): qty 2

## 2022-01-28 MED ORDER — FREE WATER
120.0000 mL | Status: DC
  Administered 2022-01-28 – 2022-02-19 (×122): 120 mL

## 2022-01-28 MED ORDER — DOXAZOSIN MESYLATE 1 MG PO TABS
1.0000 mg | ORAL_TABLET | Freq: Every day | ORAL | Status: DC
  Administered 2022-01-28 – 2022-02-19 (×22): 1 mg
  Filled 2022-01-28 (×23): qty 1

## 2022-01-28 NOTE — Progress Notes (Signed)
PROGRESS NOTE    [name]  FTD:[id number] DOB: Feb 05, 1933 DOA: 01/24/2022 PCP: [name], DO  Chief Complaint  Patient presents with   feeding tube issue     Brief Narrative:  JAICION Hunter is [name] 88 y.o. male with medical history significant of paroxysmal atrial fibrillation, chronic systolic CHF, CAD s/p CABG, sick sinus syndrome s/p pacemaker, COPD, type 2 diabetes, hypertension, hyperlipidemia, chronic thrombocytopenia, recurrent UTI, OSA not on CPAP, dysphagia with PEG tube who presents with concerns of purulent discharge from feeding tube.  S/p upsizing of g tube on 6/3.  Surgery c/s and recommended keeping g tube to gravity to ensure no pressure of phlange on skin and balloon not pulled up against intraabdominal wall.  He currently has Brigitte Soderberg cortrak for post pyloric tube feeds.  Difficult disposition at this point as he's going to need cortrak for nutrition while g tube is to gravity.    See below for additional details    Assessment & Plan:   Principal Problem:   Cellulitis Active Problems:   Hypotension   Paroxysmal atrial fibrillation (HCC)   HFrEF (heart failure with reduced ejection fraction) (HCC)   C6 cervical fracture (HCC)   Controlled type 2 diabetes mellitus without complication, without long-term current use of insulin (HCC)   Delirium   S/P CABG x 3   Hypernatremia   Dysphagia   Anemia   Urinary retention   Diarrhea   Abnormal LFTs   Pressure ulcer   Goals of care, counseling/discussion   Status post insertion of percutaneous endoscopic gastrostomy (PEG) tube ([place])   Assessment and Plan: * Cellulitis Pt has dysphagia and is s/p PEG tube placement on 5/24.  Presents with malodor and purulent discharge from PEG tube site. CT abd/pelvis showed 6.4x2.2 cm area of inflammatory/infectious phlegmon in subcutaneous plaine at site of placement of gastrostomy, pockets of air in the subcutaneous plane at the site of g tube placement (due to open wound in  skin or suggest infection with gas producing organisms).  No definite demonstrable thick walled loculated fluid collection. S/p fluoroscopic guided replacement and up sizing of now 24 fr ballon retention gastrostomy tube 6/3 per Dr. Pascal Lux, suspected pressure ulcer about medial aspect of g tube insertion - likely due to insertion angle of tube and external retention disc being too tight Appreciate surgery recommedations  - g tube to gravity  - local wound care (zinc oxide to intact portion of epidermis, dressing changes around g tube 4x daily)  - post pyloric nutrition  - wound care  - available as needed, IR can follow up prn as well Issue at this time is g tube is to gravity and he has cortrak while we're allowing wound to heal.  It's not clear how long he'll need g tube to gravity and post pyloric feeding at this point.  Will follow. -narrow abx, will plan short course of ancef given overall improvement (he's tolerated cephalosporins and augmentin in past per pharmacy) -Wound cultures are pending (moderate staph epi, moderate candida albicans - not sure how clinically relevant these are - will follow) -blood cultures pending -Wound care consulted, appreciate recs - medi honey added  Hypotension Presented with hypotension with SBP of 90 over 50s.  This has improved with fluid but remains soft.  Previously on Lasix, Entresto, Aldactone and beta-blockers at home but these were hold during last admission.  We will continue to hold now due to soft blood pressure -Continuous low rate IV fluid overnight  -  repeat echo -> EF 40-45% - BP overall improved, follow  C6 cervical fracture ([place]) This is from [facility] Flegal ground-level fall back in April.  Continue soft cervical collar and continue follow-up with neurosurgery. Discussed with Dr. Ellene Route, he notes ok to d/c c collar His note currently pending Flex ex films yesterday with known subacute fx through anterior inferior aspect of c 6 vertebral body, anterior  osteophyte not well seen, C6-7 space normally aligned without abnormal motion on extension, not well assessed on flexion (see report)  HFrEF (heart failure with reduced ejection fraction) ([place]) Appears euvolemic on exam.  Continue to monitor fluid status with IV continuous fluid. Echo as above, caution with IVF  Paroxysmal atrial fibrillation (HCC) Continue Eliquis  Delirium Delirium precautions  Controlled type 2 diabetes mellitus without complication, without long-term current use of insulin (HCC) Controlled.  Last A1c 5.2% on 5/15.  Hold on sliding scale insulin for now.  Monitor blood glucose with morning labs.  Hypernatremia Hypotonic fluids  S/P CABG x 3 Stable and asymptomatic from cardiac standpoint.  Anemia Trend Anemia labs c/w aocd, iron def   Dysphagia S/p PEG tube by VIR on 5/24, s/p replacement as above -Dietitian consulted for tube feeding  NPO for now with g tube to gravity, consider small amounts Po for comfort  Urinary retention Foley catheter placed on 5/27 due to urinary retention at recent admission.  He has planned outpatient follow-up with urology. Doxazosin, trial of void  Diarrhea Daughter reports persistent diarrhea since the start of tube feed on 5/25.  Negative GI pathogen panel Negative c diff Follow, adjusting feeds per dietitian   Abnormal LFTs Mildly elevated Will follow  Pressure ulcer Pressure Injury 01/24/22 Buttocks Deep Tissue Pressure Injury - Purple or maroon localized area of discolored intact skin or blood-filled blister due to damage of underlying soft tissue from pressure and/or shear. (Active)  01/24/22 2036  Location: Buttocks  Location Orientation:   Staging: Deep Tissue Pressure Injury - Purple or maroon localized area of discolored intact skin or blood-filled blister due to damage of underlying soft tissue from pressure and/or shear.  Wound Description (Comments):   Present on Admission: Yes   Wound c/s   Goals  of care, counseling/discussion Not currently interested in palliative care discussions, noted we can review pending course       DVT prophylaxis: eliquis Code Status: full Family Communication: grandaughter Disposition:   Status is: Inpatient Remains inpatient appropriate because: need for further abx, wound care   Consultants:  IR  Procedures:  Successful fluoroscopic guided replacement and up sizing of now 24 Fr balloon retention gastrostomy tube.    Antimicrobials:  Anti-infectives (From admission, onward)    Start     Dose/Rate Route Frequency Ordered Stop   01/27/22 1500  ceFAZolin (ANCEF) IVPB 2g/100 mL premix        2 g 200 mL/hr over 30 Minutes Intravenous Every 8 hours 01/27/22 1411 02/02/22 1740   01/27/22 1400  cefTRIAXone (ROCEPHIN) 2 g in sodium chloride 0.9 % 100 mL IVPB  Status:  Discontinued        2 g 200 mL/hr over 30 Minutes Intravenous Every 24 hours 01/27/22 1002 01/27/22 1347   01/27/22 1000  fluconazole (DIFLUCAN) tablet 100 mg        100 mg Per Tube Daily 01/26/22 0353 02/02/22 0959   01/26/22 1000  fluconazole (DIFLUCAN) 40 MG/ML suspension 100 mg  Status:  Discontinued       See Hyperspace for full Linked  Orders Report.   100 mg Per Tube Daily 01/25/22 1833 01/26/22 0353   01/26/22 0500  fluconazole (DIFLUCAN) IVPB 200 mg        200 mg 100 mL/hr over 60 Minutes Intravenous  Once 01/26/22 0353 01/26/22 0533   01/25/22 1930  fluconazole (DIFLUCAN) 40 MG/ML suspension 200 mg  Status:  Discontinued       See Hyperspace for full Linked Orders Report.   200 mg Per Tube  Once 01/25/22 1833 01/26/22 0353   01/25/22 1800  vancomycin (VANCOCIN) IVPB 1000 mg/200 mL premix  Status:  Discontinued        1,000 mg 200 mL/hr over 60 Minutes Intravenous Every 24 hours 01/24/22 2041 01/27/22 1347   01/24/22 2230  meropenem (MERREM) 1 g in sodium chloride 0.9 % 100 mL IVPB  Status:  Discontinued        1 g 200 mL/hr over 30 Minutes Intravenous Every 8 hours  01/24/22 2134 01/27/22 1002   01/24/22 1800  vancomycin (VANCOCIN) IVPB 1000 mg/200 mL premix        1,000 mg 200 mL/hr over 60 Minutes Intravenous  Once 01/24/22 1757 01/24/22 1952       Subjective: Family at bedside No c/o pain, pleasant confusion  Objective: Vitals:   01/27/22 2251 01/28/22 0541 01/28/22 0744 01/28/22 0938  BP: (!) 119/49 (!) 133/114  (!) 143/63  Pulse: 66 (!) 54  60  Resp: '16 17  17  '$ Temp: 97.7 F (36.5 C) (!) 97.5 F (36.4 C)  97.6 F (36.4 C)  TempSrc: Oral Oral  Oral  SpO2: 100% (!) 79% 95% 93%  Weight:      Height:        Intake/Output Summary (Last 24 hours) at 01/28/2022 1756 Last data filed at 01/28/2022 1502 Gross per 24 hour  Intake 1801.17 ml  Output 2800 ml  Net -998.83 ml   Filed Weights   01/24/22 2324  Weight: 86.3 kg    Examination:  General: No acute distress. Cardiovascular: RRR Lungs: unlabored Abdomen: g tube to gravity Neurological: pleasantly confused. Moves all extremities 4 with equal strength. Cranial nerves II through XII grossly intact. Extremities: No clubbing or cyanosis. No edema  Data Reviewed: I have personally reviewed following labs and imaging studies  CBC: Recent Labs  Lab 01/24/22 1524 01/25/22 0106 01/25/22 1429 01/26/22 0037 01/27/22 0313 01/28/22 0146  WBC 6.4 6.2  --  4.1 4.5 5.9  NEUTROABS 4.8  --   --  2.9  --  3.3  HGB 7.7* 7.1* 7.6* 7.2* 7.4* 7.7*  HCT 25.8* 23.1* 25.1* 23.3* 23.0* 25.4*  MCV 102.8* 100.4*  --  100.4* 98.3 100.0  PLT 115* 107*  --  98* 103* 113*    Basic Metabolic Panel: Recent Labs  Lab 01/24/22 1524 01/25/22 0106 01/26/22 0037 01/27/22 0313 01/28/22 0146  NA 138 139 141 143 146*  K 4.4 4.3 3.8 3.7 3.4*  CL 103 108 109 112* 112*  CO2 '28 25 26 25 23  '$ GLUCOSE 123* 118* 115* 108* 127*  BUN 43* 39* 41* 32* 27*  CREATININE 1.09 1.09 1.06 0.99 1.05  CALCIUM 9.8 9.5 9.3 9.2 9.3  MG  --   --  1.9  --  2.1  PHOS  --   --  2.3*  --  2.3*    GFR: Estimated  Creatinine Clearance: 53.8 mL/min (by C-G formula based on SCr of 1.05 mg/dL).  Liver Function Tests: Recent Labs  Lab 01/24/22 1524 01/25/22  0106 01/26/22 0037 01/27/22 0313 01/28/22 0433  AST 154* 104* 113* 83* 77*  ALT 124* 107* 107* 92* 70*  ALKPHOS 142* 133* 127* 112 119  BILITOT 0.5 0.5 0.4 0.8 0.6  PROT 5.1* 4.8* 4.4* 4.2* 4.7*  ALBUMIN 1.8* 1.8* 1.6* 1.6* 1.6*    CBG: Recent Labs  Lab 01/27/22 1132 01/27/22 2343 01/28/22 0545 01/28/22 1235 01/28/22 1747  GLUCAP 115* 122* 98 144* 154*     Recent Results (from the past 240 hour(s))  Urine Culture     Status: Abnormal   Collection Time: 01/24/22  3:12 PM   Specimen: Urine, Clean Catch  Result Value Ref Range Status   Specimen Description URINE, CLEAN CATCH  Final   Special Requests   Final    NONE Performed at [facility], [place] [address], [place], [place] [address]    Culture (Posie Lillibridge)  Final    40,000 COLONIES/mL ENTEROCOCCUS FAECIUM VANCOMYCIN RESISTANT ENTEROCOCCUS ISOLATED    Report Status 01/27/2022 FINAL  Final   Organism ID, Bacteria ENTEROCOCCUS FAECIUM (Znya Albino)  Final      Susceptibility   Enterococcus faecium - MIC*    AMPICILLIN >=32 RESISTANT Resistant     NITROFURANTOIN 64 INTERMEDIATE Intermediate     VANCOMYCIN >=32 RESISTANT Resistant     LINEZOLID 2 SENSITIVE Sensitive     * 40,000 COLONIES/mL ENTEROCOCCUS FAECIUM  Blood culture (routine x 2)     Status: None (Preliminary result)   Collection Time: 01/24/22  3:24 PM   Specimen: BLOOD LEFT HAND  Result Value Ref Range Status   Specimen Description BLOOD LEFT HAND  Final   Special Requests   Final    BOTTLES DRAWN AEROBIC AND ANAEROBIC Blood Culture adequate volume   Culture   Final    NO GROWTH 4 DAYS Performed at [facility], [place] [address], [place], [place] [address]    Report Status PENDING  Incomplete  Blood culture (routine x 2)     Status: None (Preliminary result)   Collection Time: 01/24/22  3:24 PM   Specimen: BLOOD  RIGHT HAND  Result Value Ref Range Status   Specimen Description BLOOD RIGHT HAND  Final   Special Requests   Final    BOTTLES DRAWN AEROBIC AND ANAEROBIC Blood Culture adequate volume   Culture   Final    NO GROWTH 4 DAYS Performed at [facility], [place] [address], [place], [place] [address]    Report Status PENDING  Incomplete  Gastrointestinal Panel by PCR , Stool     Status: None   Collection Time: 01/24/22  9:08 PM   Specimen: Peg Site; Stool  Result Value Ref Range Status   Campylobacter species NOT DETECTED NOT DETECTED Final   Plesimonas shigelloides NOT DETECTED NOT DETECTED Final   Salmonella species NOT DETECTED NOT DETECTED Final   Yersinia enterocolitica NOT DETECTED NOT DETECTED Final   Vibrio species NOT DETECTED NOT DETECTED Final   Vibrio cholerae NOT DETECTED NOT DETECTED Final   Enteroaggregative E coli (EAEC) NOT DETECTED NOT DETECTED Final   Enteropathogenic E coli (EPEC) NOT DETECTED NOT DETECTED Final   Enterotoxigenic E coli (ETEC) NOT DETECTED NOT DETECTED Final   Shiga like toxin producing E coli (STEC) NOT DETECTED NOT DETECTED Final   Shigella/Enteroinvasive E coli (EIEC) NOT DETECTED NOT DETECTED Final   Cryptosporidium NOT DETECTED NOT DETECTED Final   Cyclospora cayetanensis NOT DETECTED NOT DETECTED Final   Entamoeba histolytica NOT DETECTED NOT DETECTED Final  Giardia lamblia NOT DETECTED NOT DETECTED Final   Adenovirus F40/41 NOT DETECTED NOT DETECTED Final   Astrovirus NOT DETECTED NOT DETECTED Final   Norovirus GI/GII NOT DETECTED NOT DETECTED Final   Rotavirus Nawaf Strange NOT DETECTED NOT DETECTED Final   Sapovirus (I, II, IV, and V) NOT DETECTED NOT DETECTED Final    Comment: Performed at [facility], [address], [place], [place] [address]  C Difficile Quick Screen w PCR reflex     Status: None   Collection Time: 01/24/22  9:08 PM   Specimen: Peg Site; Stool  Result Value Ref Range Status   C Diff antigen NEGATIVE NEGATIVE Final    C Diff toxin NEGATIVE NEGATIVE Final   C Diff interpretation No C. difficile detected.  Final    Comment: Performed at [facility], [place] [address], [place], [place] [address]  Aerobic Culture w Gram Stain (superficial specimen)     Status: None   Collection Time: 01/24/22 10:07 PM   Specimen: Wound  Result Value Ref Range Status   Specimen Description WOUND  Final   Special Requests PEG SITE  Final   Gram Stain   Final    NO SQUAMOUS EPITHELIAL CELLS SEEN FEW WBC SEEN FEW GRAM POSITIVE COCCI ABUNDANT GRAM NEGATIVE RODS Performed at [facility], [address], [place], [place] [address]    Culture   Final    MODERATE STAPHYLOCOCCUS EPIDERMIDIS MODERATE CANDIDA ALBICANS    Report Status 01/27/2022 FINAL  Final   Organism ID, Bacteria STAPHYLOCOCCUS EPIDERMIDIS  Final      Susceptibility   Staphylococcus epidermidis - MIC*    CIPROFLOXACIN >=8 RESISTANT Resistant     ERYTHROMYCIN <=0.25 SENSITIVE Sensitive     GENTAMICIN <=0.5 SENSITIVE Sensitive     OXACILLIN RESISTANT Resistant     TETRACYCLINE <=1 SENSITIVE Sensitive     VANCOMYCIN 2 SENSITIVE Sensitive     TRIMETH/SULFA 20 SENSITIVE Sensitive     CLINDAMYCIN >=8 RESISTANT Resistant     RIFAMPIN <=0.5 SENSITIVE Sensitive     Inducible Clindamycin NEGATIVE Sensitive     * MODERATE STAPHYLOCOCCUS EPIDERMIDIS         Radiology Studies: DG Cervical Spine With Flex & Extend  Result Date: 01/27/2022 CLINICAL DATA:  Known C6 fracture.  Neck pain and stiffness. EXAM: CERVICAL SPINE COMPLETE WITH FLEXION AND EXTENSION VIEWS COMPARISON:  Radiograph 01/07/2022, CT 12/10/2021 FINDINGS: AP, lateral neutral, flexion and extension views obtained. The fracture through the anterior inferior aspect of C6 vertebral body and anterior osteophyte is not well seen on the current exam, better delineated on prior CT. Anterolisthesis of C2 on C3 is not seen on neutral, 6 mm on flexion and 3 mm on extension. Anterolisthesis of C3 on  C4 is not seen on neutral, 3 mm on flexion and 3 mm on extension. Anterolisthesis of C4 on C5 is 5 mm on neutral, 5 mm on flexion, and 0 mm on extension. Previous anterolisthesis of C5 on C6 is not seen on neutral, flexion, or extension. C6-C7 is normally aligned, but not well assessed on flexion. Multilevel degenerative disc disease and facet hypertrophy, better characterized on prior CT. No new radiographic abnormalities. IMPRESSION: 1. Known subacute fracture through the anterior inferior aspect of C6 vertebral body and anterior osteophyte is not well seen on the current exam, better delineated on prior CT. The C6-C7 disc space normally aligned without abnormal motion on extension, but not well assessed on flexion. 2. Anterolisthesis of C2 on  C3, C3 on C4, and C4 on C5 as described. 3. Multilevel degenerative disc disease and facet hypertrophy, better characterized on prior CT. Electronically Signed   By: Keith Rake M.D.   On: 01/27/2022 17:36   DG Abd Portable 1V  Result Date: 01/27/2022 CLINICAL DATA:  NG tube placement. EXAM: PORTABLE ABDOMEN - 1 VIEW COMPARISON:  Abdominal radiograph January 26, 2022. FINDINGS: Enteric tube courses below the diaphragm with the tip probably in the distal stomach or proximal duodenum. Contrast is seen within partially imaged colon. Lower abdomen is not imaged. Lung bases are clear. Multilevel degenerative change of the spine. IMPRESSION: Enteric tube courses below the diaphragm with the tip probably in the distal stomach or proximal duodenum. Electronically Signed   By: [name] M.D.   On: 01/27/2022 15:34   ECHOCARDIOGRAM COMPLETE  Result Date: 01/27/2022    ECHOCARDIOGRAM REPORT   Patient Name:   SERENITY FORTNER Agner Date of Exam: 01/27/2022 Medical Rec #:  [id number]      Height:       71.0 in Accession #:    [id number]     Weight:       190.3 lb Date of Birth:  1933-01-25       BSA:          2.064 m Patient Age:    74 years       BP:           132/63 mmHg Patient  Gender: M              HR:           74 bpm. Exam Location:  Inpatient Procedure: 2D Echo, Cardiac Doppler and Color Doppler Indications:    Other abnormalities of the heart R00.8  History:        Patient has prior history of Echocardiogram examinations, most                 recent 04/25/2020. CAD, Prior CABG, COPD and Carotid Disease,                 Arrythmias:Atrial Fibrillation; Risk Factors:Hypertension and                 Diabetes. GERD. AAA.  Sonographer:    Darlina Sicilian RDCS Referring Phys: [id number] Jozlin Bently CALDWELL POWELL   1. Left ventricular ejection fraction, by estimation, is 40 to 45%. The left ventricle has mildly decreased function. The left ventricle demonstrates global hypokinesis. Left ventricular diastolic parameters are consistent with Grade I diastolic dysfunction (impaired relaxation).  2. Right ventricular systolic function is normal. The right ventricular size is normal.  3. The mitral valve is normal in structure. No evidence of mitral valve regurgitation. No evidence of mitral stenosis.  4. The right coronary cusp appears to be restrictive and does not open, the left and noncoronary cusp does open well. The aortic valve is tricuspid. Aortic valve regurgitation is mild to moderate. Suspect there is mild aortic stenosis.  5. There is moderate dilatation of the aortic root, measuring 41 mm. There is moderate dilatation of the ascending aorta, measuring 41 mm.  6. The inferior vena cava is normal in size with greater than 50% respiratory variability, suggesting right atrial pressure of 3 mmHg. FINDINGS  Left Ventricle: Left ventricular ejection fraction, by estimation, is 40 to 45%. The left ventricle has mildly decreased function. The left ventricle demonstrates global hypokinesis. The left ventricular internal cavity size was normal in size. There is  no left ventricular hypertrophy. Abnormal (paradoxical) septal motion, consistent with RV pacemaker. Left ventricular diastolic  parameters are consistent with Grade I diastolic dysfunction (impaired relaxation). Right Ventricle: The right ventricular size is normal. No increase in right ventricular wall thickness. Right ventricular systolic function is normal. Left Atrium: Left atrial size was normal in size. Right Atrium: Right atrial size was normal in size. Pericardium: There is no evidence of pericardial effusion. Mitral Valve: The mitral valve is normal in structure. No evidence of mitral valve regurgitation. No evidence of mitral valve stenosis. Tricuspid Valve: The tricuspid valve is normal in structure. Tricuspid valve regurgitation is not demonstrated. No evidence of tricuspid stenosis. Aortic Valve: The right coronary cusp appears to be restrictive and does not open, the left and noncoronary cusp does open well. The aortic valve is tricuspid. Aortic valve regurgitation is mild to moderate. Aortic regurgitation PHT measures 561 msec. Suspect there is mild aortic stenosis. Pulmonic Valve: The pulmonic valve was not well visualized. Pulmonic valve regurgitation is not visualized. No evidence of pulmonic stenosis. Aorta: There is moderate dilatation of the aortic root, measuring 41 mm. There is moderate dilatation of the ascending aorta, measuring 41 mm. Venous: The inferior vena cava is normal in size with greater than 50% respiratory variability, suggesting right atrial pressure of 3 mmHg. IAS/Shunts: No atrial level shunt detected by color flow Doppler. Additional Comments: Frimy Uffelman device lead is visualized.  LEFT VENTRICLE PLAX 2D LVIDd:         6.40 cm   Diastology LVIDs:         4.60 cm   LV e' medial:    5.35 cm/s LV PW:         1.20 cm   LV E/e' medial:  12.4 LV IVS:        1.20 cm   LV e' lateral:   5.27 cm/s LVOT diam:     2.80 cm   LV E/e' lateral: 12.6 LV SV:         77 LV SV Index:   37 LVOT Area:     6.16 cm  LEFT ATRIUM             Index LA diam:        3.10 cm 1.50 cm/m LA Vol (A2C):   36.4 ml 17.63 ml/m LA Vol (A4C):    50.8 ml 24.63 ml/m LA Biplane Vol: 40.2 ml 19.47 ml/m  AORTIC VALVE LVOT Vmax:   74.40 cm/s LVOT Vmean:  50.700 cm/s LVOT VTI:    0.125 m AI PHT:      561 msec  AORTA Ao Root diam: 4.10 cm Ao Asc diam:  4.05 cm MITRAL VALVE MV Area (PHT): 3.70 cm    SHUNTS MV Decel Time: 205 msec    Systemic VTI:  0.12 m MV E velocity: 66.40 cm/s  Systemic Diam: 2.80 cm MV Lamyiah Crawshaw velocity: 96.00 cm/s MV E/Miyani Cronic ratio:  0.69 Kardie Tobb DO Electronically signed by Berniece Salines DO Signature Date/Time: 01/27/2022/11:17:13 AM    Final         Scheduled Meds:  acetaminophen  1,000 mg Per Tube Q8H   amiodarone  200 mg Per Tube Daily   apixaban  5 mg Per Tube BID   Chlorhexidine Gluconate Cloth  6 each Topical Q0600   doxazosin  1 mg Per Tube Daily   famotidine  20 mg Per Tube BID   feeding supplement (KATE FARMS STANDARD 1.4)  325 mL Per Tube Q8H   fluconazole  100 mg Per Tube Daily   fluticasone furoate-vilanterol  1 puff Inhalation Daily   free water  120 mL Per Tube Q4H   leptospermum manuka honey  1 application. Topical BID   mirtazapine  7.5 mg Per Tube QHS   nutrition supplement (JUVEN)  1 packet Per Tube BID BM   venlafaxine  18.75 mg Per Tube BID WC   Zinc Oxide   Topical BID   Continuous Infusions:   ceFAZolin (ANCEF) IV Stopped (01/28/22 1350)   dextrose 5 % and 0.45% NaCl 75 mL/hr at 01/28/22 1502     LOS: 4 days    Time spent: over 30 min    Fayrene Helper, MD [facility]   To contact the attending provider between 7A-7P or the covering provider during after hours 7P-7A, please log into the web site [email or web address] and access using universal [place] password for that web site. If you do not have the password, please call the hospital operator.  01/28/2022, 5:56 PM

## 2022-01-28 NOTE — Progress Notes (Signed)
Mobility Specialist Progress Note:   01/28/22 1500  Mobility  Activity Turned to left side;Turned to right side;Turned to back - supine  Level of Assistance Maximum assist, patient does 25-49%  Activity Response Tolerated poorly  $Mobility charge 1 Mobility   RN requesting assistance to roll pt for pericare. Pt tolerated poorly, yelling out in pain. Pt left with all needs met, comfortable in bed with family present.   [name] Acute Rehab Secure Chat or Office Phone: [phone]

## 2022-01-28 NOTE — Progress Notes (Addendum)
Subjective: CC: Seems confused this morning.  Yelling about his legs and not having legs.  I reassured him that he has legs and moved them back into bed for him.  RN states there was drainage from around g-tube overnight after TFs started, but did not appear to be TF, mostly bilious  Objective: Vital signs in last 24 hours: Temp:  [97.5 F (36.4 C)-97.8 F (36.6 C)] 97.6 F (36.4 C) (06/06 0938) Pulse Rate:  [54-69] 60 (06/06 0938) Resp:  [16-17] 17 (06/06 0938) BP: (107-143)/(49-114) 143/63 (06/06 0938) SpO2:  [79 %-100 %] 93 % (06/06 0938) Last BM Date : 01/28/22  Intake/Output from previous day: 06/05 0701 - 06/06 0700 In: 1338.2 [I.V.:637.7; NG/GT:500; IV Piggyback:200.4] Out: 2200 [Urine:1800; Drains:400] Intake/Output this shift: No intake/output data recorded.  PE: Gen:  Alert, but seems a bit confused this am Abd: Soft, ND, NT. No rigidity or guarding. G-tube present with medial full thickness wound as seen in prior pictures. The small bit of necrotic tissue was removed today.  Bilious drainage was noted today on gauze and in the crater.  This was cleaned out and lavaged with 10cc of NS.  Wound was redressed.  (From admission)  Lab Results:  Recent Labs    01/27/22 0313 01/28/22 0146  WBC 4.5 5.9  HGB 7.4* 7.7*  HCT 23.0* 25.4*  PLT 103* 113*   BMET Recent Labs    01/27/22 0313 01/28/22 0146  NA 143 146*  K 3.7 3.4*  CL 112* 112*  CO2 25 23  GLUCOSE 108* 127*  BUN 32* 27*  CREATININE 0.99 1.05  CALCIUM 9.2 9.3   PT/INR No results for input(s): LABPROT, INR in the last 72 hours. CMP     Component Value Date/Time   NA 146 (H) 01/28/2022 0146   NA 142 10/15/2021 0927   K 3.4 (L) 01/28/2022 0146   CL 112 (H) 01/28/2022 0146   CO2 23 01/28/2022 0146   GLUCOSE 127 (H) 01/28/2022 0146   BUN 27 (H) 01/28/2022 0146   BUN 24 10/15/2021 0927   CREATININE 1.05 01/28/2022 0146   CREATININE 0.92 03/10/2013 0810   CALCIUM 9.3 01/28/2022 0146    PROT 4.7 (L) 01/28/2022 0433   PROT 6.1 07/01/2021 1523   ALBUMIN 1.6 (L) 01/28/2022 0433   ALBUMIN 3.8 10/15/2021 0927   AST 77 (H) 01/28/2022 0433   ALT 70 (H) 01/28/2022 0433   ALKPHOS 119 01/28/2022 0433   BILITOT 0.6 01/28/2022 0433   BILITOT 0.9 07/01/2021 1523   GFRNONAA >60 01/28/2022 0146   GFRNONAA 79 03/10/2013 0810   GFRAA 58 (L) 10/05/2020 0950   GFRAA >89 03/10/2013 0810   Lipase     Component Value Date/Time   LIPASE 70 (H) 01/24/2022 1524    Studies/Results: DG Cervical Spine With Flex & Extend  Result Date: 01/27/2022 CLINICAL DATA:  Known C6 fracture.  Neck pain and stiffness. EXAM: CERVICAL SPINE COMPLETE WITH FLEXION AND EXTENSION VIEWS COMPARISON:  Radiograph 01/07/2022, CT 12/10/2021 FINDINGS: AP, lateral neutral, flexion and extension views obtained. The fracture through the anterior inferior aspect of C6 vertebral body and anterior osteophyte is not well seen on the current exam, better delineated on prior CT. Anterolisthesis of C2 on C3 is not seen on neutral, 6 mm on flexion and 3 mm on extension. Anterolisthesis of C3 on C4 is not seen on neutral, 3 mm on flexion and 3 mm on extension. Anterolisthesis of C4 on C5 is 5  mm on neutral, 5 mm on flexion, and 0 mm on extension. Previous anterolisthesis of C5 on C6 is not seen on neutral, flexion, or extension. C6-C7 is normally aligned, but not well assessed on flexion. Multilevel degenerative disc disease and facet hypertrophy, better characterized on prior CT. No new radiographic abnormalities. IMPRESSION: 1. Known subacute fracture through the anterior inferior aspect of C6 vertebral body and anterior osteophyte is not well seen on the current exam, better delineated on prior CT. The C6-C7 disc space normally aligned without abnormal motion on extension, but not well assessed on flexion. 2. Anterolisthesis of C2 on C3, C3 on C4, and C4 on C5 as described. 3. Multilevel degenerative disc disease and facet hypertrophy,  better characterized on prior CT. Electronically Signed   By: Keith Rake M.D.   On: 01/27/2022 17:36   DG Abd Portable 1V  Result Date: 01/27/2022 CLINICAL DATA:  NG tube placement. EXAM: PORTABLE ABDOMEN - 1 VIEW COMPARISON:  Abdominal radiograph January 26, 2022. FINDINGS: Enteric tube courses below the diaphragm with the tip probably in the distal stomach or proximal duodenum. Contrast is seen within partially imaged colon. Lower abdomen is not imaged. Lung bases are clear. Multilevel degenerative change of the spine. IMPRESSION: Enteric tube courses below the diaphragm with the tip probably in the distal stomach or proximal duodenum. Electronically Signed   By: [name] M.D.   On: 01/27/2022 15:34   ECHOCARDIOGRAM COMPLETE  Result Date: 01/27/2022    ECHOCARDIOGRAM REPORT   Patient Name:   [name] Date of Exam: 01/27/2022 Medical Rec #:  [id number]      Height:       71.0 in Accession #:    [id number]     Weight:       190.3 lb Date of Birth:  1933/06/15       BSA:          2.064 m Patient Age:    88 years       BP:           132/63 mmHg Patient Gender: M              HR:           74 bpm. Exam Location:  Inpatient Procedure: 2D Echo, Cardiac Doppler and Color Doppler Indications:    Other abnormalities of the heart R00.8  History:        Patient has prior history of Echocardiogram examinations, most                 recent 04/25/2020. CAD, Prior CABG, COPD and Carotid Disease,                 Arrythmias:Atrial Fibrillation; Risk Factors:Hypertension and                 Diabetes. GERD. AAA.  Sonographer:    Darlina Sicilian RDCS Referring Phys: [phone] A CALDWELL POWELL [place]  1. Left ventricular ejection fraction, by estimation, is 40 to 45%. The left ventricle has mildly decreased function. The left ventricle demonstrates global hypokinesis. Left ventricular diastolic parameters are consistent with Grade I diastolic dysfunction (impaired relaxation).  2. Right ventricular systolic  function is normal. The right ventricular size is normal.  3. The mitral valve is normal in structure. No evidence of mitral valve regurgitation. No evidence of mitral stenosis.  4. The right coronary cusp appears to be restrictive and does not open, the left and noncoronary cusp does open well.  The aortic valve is tricuspid. Aortic valve regurgitation is mild to moderate. Suspect there is mild aortic stenosis.  5. There is moderate dilatation of the aortic root, measuring 41 mm. There is moderate dilatation of the ascending aorta, measuring 41 mm.  6. The inferior vena cava is normal in size with greater than 50% respiratory variability, suggesting right atrial pressure of 3 mmHg. FINDINGS  Left Ventricle: Left ventricular ejection fraction, by estimation, is 40 to 45%. The left ventricle has mildly decreased function. The left ventricle demonstrates global hypokinesis. The left ventricular internal cavity size was normal in size. There is  no left ventricular hypertrophy. Abnormal (paradoxical) septal motion, consistent with RV pacemaker. Left ventricular diastolic parameters are consistent with Grade I diastolic dysfunction (impaired relaxation). Right Ventricle: The right ventricular size is normal. No increase in right ventricular wall thickness. Right ventricular systolic function is normal. Left Atrium: Left atrial size was normal in size. Right Atrium: Right atrial size was normal in size. Pericardium: There is no evidence of pericardial effusion. Mitral Valve: The mitral valve is normal in structure. No evidence of mitral valve regurgitation. No evidence of mitral valve stenosis. Tricuspid Valve: The tricuspid valve is normal in structure. Tricuspid valve regurgitation is not demonstrated. No evidence of tricuspid stenosis. Aortic Valve: The right coronary cusp appears to be restrictive and does not open, the left and noncoronary cusp does open well. The aortic valve is tricuspid. Aortic valve regurgitation  is mild to moderate. Aortic regurgitation PHT measures 561 msec. Suspect there is mild aortic stenosis. Pulmonic Valve: The pulmonic valve was not well visualized. Pulmonic valve regurgitation is not visualized. No evidence of pulmonic stenosis. Aorta: There is moderate dilatation of the aortic root, measuring 41 mm. There is moderate dilatation of the ascending aorta, measuring 41 mm. Venous: The inferior vena cava is normal in size with greater than 50% respiratory variability, suggesting right atrial pressure of 3 mmHg. IAS/Shunts: No atrial level shunt detected by color flow Doppler. Additional Comments: A device lead is visualized.  LEFT VENTRICLE PLAX 2D LVIDd:         6.40 cm   Diastology LVIDs:         4.60 cm   LV e' medial:    5.35 cm/s LV PW:         1.20 cm   LV E/e' medial:  12.4 LV IVS:        1.20 cm   LV e' lateral:   5.27 cm/s LVOT diam:     2.80 cm   LV E/e' lateral: 12.6 LV SV:         77 LV SV Index:   37 LVOT Area:     6.16 cm  LEFT ATRIUM             Index LA diam:        3.10 cm 1.50 cm/m LA Vol (A2C):   36.4 ml 17.63 ml/m LA Vol (A4C):   50.8 ml 24.63 ml/m LA Biplane Vol: 40.2 ml 19.47 ml/m  AORTIC VALVE LVOT Vmax:   74.40 cm/s LVOT Vmean:  50.700 cm/s LVOT VTI:    0.125 m AI PHT:      561 msec  AORTA Ao Root diam: 4.10 cm Ao Asc diam:  4.05 cm MITRAL VALVE MV Area (PHT): 3.70 cm    SHUNTS MV Decel Time: 205 msec    Systemic VTI:  0.12 m MV E velocity: 66.40 cm/s  Systemic Diam: 2.80 cm MV A velocity: 96.00  cm/s MV E/A ratio:  0.69 Kardie Tobb DO Electronically signed by Berniece Salines DO Signature Date/Time: 01/27/2022/11:17:13 AM    Final     Anti-infectives: Anti-infectives (From admission, onward)    Start     Dose/Rate Route Frequency Ordered Stop   01/27/22 1500  ceFAZolin (ANCEF) IVPB 2g/100 mL premix        2 g 200 mL/hr over 30 Minutes Intravenous Every 8 hours 01/27/22 1411     01/27/22 1400  cefTRIAXone (ROCEPHIN) 2 g in sodium chloride 0.9 % 100 mL IVPB  Status:   Discontinued        2 g 200 mL/hr over 30 Minutes Intravenous Every 24 hours 01/27/22 1002 01/27/22 1347   01/27/22 1000  fluconazole (DIFLUCAN) tablet 100 mg        100 mg Per Tube Daily 01/26/22 0353 02/02/22 0959   01/26/22 1000  fluconazole (DIFLUCAN) 40 MG/ML suspension 100 mg  Status:  Discontinued       See Hyperspace for full Linked Orders Report.   100 mg Per Tube Daily 01/25/22 1833 01/26/22 0353   01/26/22 0500  fluconazole (DIFLUCAN) IVPB 200 mg        200 mg 100 mL/hr over 60 Minutes Intravenous  Once 01/26/22 0353 01/26/22 0533   01/25/22 1930  fluconazole (DIFLUCAN) 40 MG/ML suspension 200 mg  Status:  Discontinued       See Hyperspace for full Linked Orders Report.   200 mg Per Tube  Once 01/25/22 1833 01/26/22 0353   01/25/22 1800  vancomycin (VANCOCIN) IVPB 1000 mg/200 mL premix  Status:  Discontinued        1,000 mg 200 mL/hr over 60 Minutes Intravenous Every 24 hours 01/24/22 2041 01/27/22 1347   01/24/22 2230  meropenem (MERREM) 1 g in sodium chloride 0.9 % 100 mL IVPB  Status:  Discontinued        1 g 200 mL/hr over 30 Minutes Intravenous Every 8 hours 01/24/22 2134 01/27/22 1002   01/24/22 1800  vancomycin (VANCOCIN) IVPB 1000 mg/200 mL premix        1,000 mg 200 mL/hr over 60 Minutes Intravenous  Once 01/24/22 1757 01/24/22 1952        Assessment/Plan Pressure Wound Related to G-tube placed by IR - S/p IR upsize g-tube 6/3 - Would keep G-tube to gravity to ensure no pressure of the phlange on the skin. Also ensure balloon is not pulled up against the intraabdominal wall.  -Cortrak placed and post pyloric TFs initiated.  This is a long process for healing.  Unfortunately, long-term TFs and Cortrak use is not available long-term. - Cont current wound care w/ zinc oxide to intact portion of the epidermis. WOCN to consult for further recommendations of wound care, but given some increased drainage, will increase dressing changes around the g-tube to 4x/day to keep  this as clean and dry as possible to minimize excoriation. - Can de-escalate abx from our standpoint -d/w case with primary team today -unfortunately, not much else to offer surgically.  This will likely be a long-term problem for the patient with minimal solutions, except time.  We are available as needed.  IR can follow up on the patient as needed as well.   LOS: 4 days    Henreitta Cea , [facility] 01/28/2022, 9:45 AM Please see Amion for pager number during day hours 7:00am-4:30pm

## 2022-01-28 NOTE — Assessment & Plan Note (Addendum)
-   Likely secondary to dehydration.   -Improved with hydration.   -Patient placed on gentle hydration 01/31/2022 due to malposition of cortrack tube. -Cortrack placed back and was in position as such IV fluids discontinued.   -Patient now with a GJ tube and core track discontinued and patient on tube feeds.   -Sodium at 128 this morning. -Follow.
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
## 2022-01-28 NOTE — Plan of Care (Signed)
  Problem: Education: Goal: Knowledge of General Education information will improve Description: Including pain rating scale, medication(s)/side effects and non-pharmacologic comfort measures Outcome: Progressing   Problem: Elimination: Goal: Will not experience complications related to bowel motility Outcome: Progressing Goal: Will not experience complications related to urinary retention Outcome: Progressing   Problem: Pain Managment: Goal: General experience of comfort will improve Outcome: Progressing   Problem: Safety: Goal: Ability to remain free from injury will improve Outcome: Progressing
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
## 2022-01-28 NOTE — Progress Notes (Signed)
Late entry note, I saw patient yesterday, but looks like I didn't document - I think I documented within problem based charting, but did not submit note, unfortunately.  Unable to go back and document accurately, but patient was seen and evaluated - he was more confused and sleepy yesterday.  Cortrak placed yesterday for nutrition to help with nutrition while g tube to gravity per surgery.  Wound care nurse was consulted.  Neurosurgery was consulted (Dr. Ellene Route) to assess whether c collar could come off, plain films completed.  This will be [name] no charge note.  See note from 6/4 and 6/6 for additional details.

## 2022-01-28 NOTE — Progress Notes (Addendum)
Nutrition Follow-up  DOCUMENTATION CODES:  Severe malnutrition in context of chronic illness  INTERVENTION:  Pt refeeding risk and is showing signs of refeeding. Recommend continuing to monitor and replace as needed. Continue current diet as ordered, encourage PO intake as pt desires Continue TF regimen as follows: [place] 1.4 @ 49m/h (see TF [facility] in [facility] of open system) Free water flush: [id number] This regimen provides: 2184 kcal, 96g of protein, and 184315mf free water (TF + flush)  NUTRITION DIAGNOSIS:  Severe Malnutrition (in the context of chronic illness) related to dysphagia as evidenced by severe fat depletion, severe muscle depletion. - remains applicable  GOAL:  Patient will meet greater than or equal to 90% of their needs - being addressed with TF  MONITOR:  Diet advancement, Labs, Weight trends, TF tolerance, Skin, I & O's  REASON FOR ASSESSMENT:  Consult Assessment of nutrition requirement/status  ASSESSMENT:  88 26o. male with medical history significant of paroxysmal atrial fibrillation, chronic systolic CHF, CAD s/p CABG x 3, sick sinus syndrome s/p pacemaker, COPD, type 2 diabetes, hypertension, hyperlipidemia, chronic thrombocytopenia, incisional hernia s/p repair 2004, inguinal hernia repair, recurrent UTI, OSA not on CPAP, prostate ca, COVID 19 (6/22), AAA, GERD, CVA and dysphagia s/p IR G tube 5/24 who presents with concerns of purulent discharge from feeding tube and diarrhea.  6/3 - IR replacement of 24 Fr balloon retention gastrostomy tube  6/5 - cortrak tube placement (post-pyloric)  Noted that pt developed a necrotic area around G-tube site. Tube is current set to gravity to minimize pressure on the skin. Cortrak tube was placed yesterday so that feeds could be resumed.  Pt resting in bed at the time of assessment, family at bedside able to assist with nutrition hx. Pt recently admitted 4/18-4/25 and 5/15-5/30 and was followed by  nutrition team. PEG was placed during last admission and pt was discharged on Osm[place] 1.5. Daughter on the phone stated they do not want to be on this formula at discharge. [facility] appears to be tolerating per family. Granddaughter at bedside does note that pt has loose stools at baseline due to his diet. Discussed nutrition plan with family. Pt previously on a bolus regimen, will adjust to continuous open-system feeds while small bowel feeds are being utilized. Discussed with RN as well.  Noted, pt at high refeeding risk and showing signs this AM (low K and phosphorus). Messaged and paged attending requesting replacement. Added on follow-up labs for AM. Also messaged surgery team as no response was received from attending.  Nutritionally Relevant Medications: Scheduled Meds:  doxazosin  1 mg Per Tube Daily   famotidine  20 mg Per Tube BID   KATE FARMS STANDARD 1.4  325 mL Per Tube 5 X Daily   free water  120 mL Per Tube 5 X Daily   mirtazapine  7.5 mg Per Tube QHS   JUVEN  1 packet Per Tube BID BM   Continuous Infusions:   ceFAZolin (ANCEF) IV 2 g (01/28/22 0526)   dextrose 5 % and 0.45% NaCl     PRN Meds: alum & mag hydroxide-simeth  Labs Reviewed: Sodium 146, chloride 112 K 3.4 BUN 27 Phosphorus 2.3 CBG ranges from 98-144 mg/dL over the last 24 hours  NUTRITION - FOCUSED PHYSICAL EXAM: Flowsheet Row Most Recent Value  Orbital Region Severe depletion  Upper Arm Region Severe depletion  Thoracic and Lumbar Region Severe depletion  Buccal Region Severe depletion  [place]  Moderate depletion  Clavicle Bone Region Severe depletion  Clavicle and Acromion Bone Region Severe depletion  Scapular Bone Region Severe depletion  Dorsal Hand Severe depletion  Patellar Region Severe depletion  Anterior Thigh Region Severe depletion  Posterior Calf Region Severe depletion  Edema (RD Assessment) None  Hair Reviewed  Eyes Reviewed  Mouth Reviewed  Skin Reviewed   Nails Reviewed   Diet Order:   Diet Order             DIET DYS 2 Room service appropriate? Yes; Fluid consistency: Thin  Diet effective now                  EDUCATION NEEDS:  Education needs have been addressed (with pt's daughter)  Skin:  Skin Assessment: Reviewed RN Assessment (ecchymosis, DTI buttocks)  Last BM:  6/6 - type 6  Height:  Ht Readings from Last 1 Encounters:  01/25/22 '5\' 11"'$  (1.803 m)   Weight:  Wt Readings from Last 1 Encounters:  01/24/22 86.3 kg   Ideal Body Weight:  78 kg  BMI:  Body mass index is 26.54 kg/m.  Estimated Nutritional Needs:  Kcal:  1900-2200kcal/day Protein:  95-110g/day Fluid:  1.9-2.2L/day   Ranell Patrick, RD, LDN Clinical Dietitian RD pager # available in [facility]  After hours/weekend pager # available in [facility]

## 2022-01-29 DIAGNOSIS — L03818 Cellulitis of other sites: Secondary | ICD-10-CM | POA: Diagnosis not present

## 2022-01-29 DIAGNOSIS — R410 Disorientation, unspecified: Secondary | ICD-10-CM

## 2022-01-29 DIAGNOSIS — D649 Anemia, unspecified: Secondary | ICD-10-CM

## 2022-01-29 DIAGNOSIS — L03311 Cellulitis of abdominal wall: Secondary | ICD-10-CM | POA: Diagnosis not present

## 2022-01-29 DIAGNOSIS — E870 Hyperosmolality and hypernatremia: Secondary | ICD-10-CM

## 2022-01-29 DIAGNOSIS — R7989 Other specified abnormal findings of blood chemistry: Secondary | ICD-10-CM | POA: Diagnosis not present

## 2022-01-29 LAB — COMPREHENSIVE METABOLIC PANEL
ALT: 31 U/L (ref 0–44)
AST: 51 U/L — ABNORMAL HIGH (ref 15–41)
Albumin: 1.7 g/dL — ABNORMAL LOW (ref 3.5–5.0)
Alkaline Phosphatase: 111 U/L (ref 38–126)
Anion gap: 10 (ref 5–15)
BUN: 31 mg/dL — ABNORMAL HIGH (ref 8–23)
CO2: 26 mmol/L (ref 22–32)
Calcium: 8.8 mg/dL — ABNORMAL LOW (ref 8.9–10.3)
Chloride: 108 mmol/L (ref 98–111)
Creatinine, Ser: 0.93 mg/dL (ref 0.61–1.24)
GFR, Estimated: >60 mL/min (ref >60)
Glucose, Bld: 145 mg/dL — ABNORMAL HIGH (ref 70–99)
Potassium: 3.3 mmol/L — ABNORMAL LOW (ref 3.5–5.1)
Sodium: 144 mmol/L (ref 135–145)
Total Bilirubin: 0.5 mg/dL (ref 0.3–1.2)
Total Protein: 4.2 g/dL — ABNORMAL LOW (ref 6.5–8.1)

## 2022-01-29 LAB — CBC WITH DIFFERENTIAL/PLATELET
Abs Immature Granulocytes: 0.12 10*3/uL — ABNORMAL HIGH (ref 0.00–0.07)
Basophils Absolute: 0 10*3/uL (ref 0.0–0.1)
Basophils Relative: 0 %
Eosinophils Absolute: 0.2 10*3/uL (ref 0.0–0.5)
Eosinophils Relative: 3 %
HCT: 24.5 % — ABNORMAL LOW (ref 39.0–52.0)
Hemoglobin: 7.4 g/dL — ABNORMAL LOW (ref 13.0–17.0)
Immature Granulocytes: 3 %
Lymphocytes Relative: 28 %
Lymphs Abs: 1.3 10*3/uL (ref 0.7–4.0)
MCH: 30.5 pg (ref 26.0–34.0)
MCHC: 30.2 g/dL (ref 30.0–36.0)
MCV: 100.8 fL — ABNORMAL HIGH (ref 80.0–100.0)
Monocytes Absolute: 0.3 10*3/uL (ref 0.1–1.0)
Monocytes Relative: 7 %
Neutro Abs: 2.8 10*3/uL (ref 1.7–7.7)
Neutrophils Relative %: 59 %
Platelets: 106 10*3/uL — ABNORMAL LOW (ref 150–400)
RBC: 2.43 MIL/uL — ABNORMAL LOW (ref 4.22–5.81)
RDW: 18.6 % — ABNORMAL HIGH (ref 11.5–15.5)
WBC: 4.7 10*3/uL (ref 4.0–10.5)
nRBC: 0 % (ref 0.0–0.2)

## 2022-01-29 LAB — CULTURE, BLOOD (ROUTINE X 2)
Culture: NO GROWTH
Culture: NO GROWTH
Special Requests: ADEQUATE
Special Requests: ADEQUATE

## 2022-01-29 LAB — MAGNESIUM
Magnesium: 2.1 mg/dL (ref 1.7–2.4)
Magnesium: 2 mg/dL (ref 1.7–2.4)

## 2022-01-29 LAB — PHOSPHORUS
Phosphorus: 2.4 mg/dL — ABNORMAL LOW (ref 2.5–4.6)
Phosphorus: 2.5 mg/dL (ref 2.5–4.6)

## 2022-01-29 LAB — GLUCOSE, CAPILLARY
Glucose-Capillary: 117 mg/dL — ABNORMAL HIGH (ref 70–99)
Glucose-Capillary: 145 mg/dL — ABNORMAL HIGH (ref 70–99)
Glucose-Capillary: 149 mg/dL — ABNORMAL HIGH (ref 70–99)

## 2022-01-29 MED ORDER — POTASSIUM CHLORIDE 20 MEQ PO PACK
40.0000 meq | PACK | Freq: Once | ORAL | Status: AC
  Administered 2022-01-29: 40 meq
  Filled 2022-01-29: qty 2

## 2022-01-29 NOTE — Progress Notes (Signed)
PROGRESS NOTE    [name]  MHD:[id number] DOB: [birth year] DOA: 01/24/2022 PCP: [name], DO    Chief Complaint  Patient presents with   feeding tube issue     Brief Narrative:  [name] is a 88 y.o. male with medical history significant of paroxysmal atrial fibrillation, chronic systolic CHF, CAD s/p CABG, sick sinus syndrome s/p pacemaker, COPD, type 2 diabetes, hypertension, hyperlipidemia, chronic thrombocytopenia, recurrent UTI, OSA not on CPAP, dysphagia with PEG tube who presents with concerns of purulent discharge from feeding tube.  S/p upsizing of g tube on 6/3.  Surgery c/s and recommended keeping g tube to gravity to ensure no pressure of phlange on skin and balloon not pulled up against intraabdominal wall.  He currently has a cortrak for post pyloric tube feeds.  Difficult disposition at this point as he's going to need cortrak for nutrition while g tube is to gravity.    See below for additional details    Assessment & Plan:  Principal Problem:   Cellulitis Active Problems:   Hypotension   Paroxysmal atrial fibrillation (HCC)   HFrEF (heart failure with reduced ejection fraction) (HCC)   C6 cervical fracture (HCC)   Controlled type 2 diabetes mellitus without complication, without long-term current use of insulin (HCC)   Delirium   S/P CABG x 3   Hypernatremia   Dysphagia   Anemia   Urinary retention   Diarrhea   Abnormal LFTs   Pressure ulcer   Goals of care, counseling/discussion   Status post insertion of percutaneous endoscopic gastrostomy (PEG) tube ([place])    Assessment and Plan: * Cellulitis Pt has dysphagia and is s/p PEG tube placement on 5/24.  Presents with malodor and purulent discharge from PEG tube site. CT abd/pelvis showed 6.4x2.2 cm area of inflammatory/infectious phlegmon in subcutaneous plaine at site of placement of gastrostomy, pockets of air in the subcutaneous plane at the site of g tube placement (due to open wound  in skin or suggest infection with gas producing organisms).  No definite demonstrable thick walled loculated fluid collection. S/p fluoroscopic guided replacement and up sizing of now 24 fr ballon retention gastrostomy tube 6/3 per Dr. Pascal Lux, suspected pressure ulcer about medial aspect of g tube insertion - likely due to insertion angle of tube and external retention disc being too tight Appreciate surgery recommedations  - g tube to gravity  - local wound care (zinc oxide to intact portion of epidermis, dressing changes around g tube 4x daily)  - post pyloric nutrition  - wound care  - available as needed, IR can follow up prn as well Issue at this time is g tube is to gravity and he has cortrak while we're allowing wound to heal.  It's not clear how long he'll need g tube to gravity and post pyloric feeding at this point.  -narrow abx, will plan short course of ancef given overall improvement (he's tolerated cephalosporins and augmentin in past per pharmacy) -Wound cultures are pending (moderate staph epi, moderate candida albicans - not sure how clinically relevant these are - will follow) -blood cultures pending -Wound care consulted, appreciate recs - medi honey added  Hypotension Presented with hypotension with SBP of 90 over 50s.  This has improved with fluid but remains soft.  Previously on Lasix, Entresto, Aldactone and beta-blockers at home but these were hold during last admission.  We will continue to hold now due to soft blood pressure -Continue gentle hydration with D5 half-normal  saline at 75 cc an hour for another 24 hours -repeat echo -> EF 40-45% - BP overall improved, follow  C6 cervical fracture ([place]) This is from a ground-level fall back in April.  Continue soft cervical collar and continue follow-up with neurosurgery. Discussed with Dr. Ellene Route, he notes ok to d/c c collar Flex ex films  with known subacute fx through anterior inferior aspect of c 6 vertebral body,  anterior osteophyte not well seen, C6-7 space normally aligned without abnormal motion on extension, not well assessed on flexion (see report) -Patient seen by neurosurgery who states C6 fracture is healed and patient can be removed from collar and pursue activities as tolerated.  HFrEF (heart failure with reduced ejection fraction) ([place]) Appears euvolemic on exam.  Continue to monitor fluid status with IV continuous fluid. Echo as above, caution with IVF  Paroxysmal atrial fibrillation (HCC) -Currently rate controlled.   -Continue amiodarone. -Eliquis for anticoagulation.  Delirium Delirium precautions  Controlled type 2 diabetes mellitus without complication, without long-term current use of insulin (HCC) Controlled.  Last A1c 5.2% on 5/15. -CBG 149 this morning -Follow. -Hold off on SSI.  Hypernatremia Likely secondary to dehydration.   -Continue hypotonic fluids  S/P CABG x 3 Stable and asymptomatic from cardiac standpoint.  Anemia Trend Anemia labs c/w aocd, iron def -Hemoglobin stable at 7.4. -Transfusion threshold hemoglobin < 7.    Dysphagia S/p PEG tube by VIR on 5/24, s/p replacement as above -Dietitian consulted for tube feeding  NPO for now with g tube to gravity, consider small amounts Po for comfort  Urinary retention Foley catheter placed on 5/27 due to urinary retention at recent admission.  He has planned outpatient follow-up with urology. -Patient underwent voiding trial with urine output of 1.1 L over the past 24 hours. -Continue doxazosin. -Monitor urine output.  Diarrhea Daughter reported persistent diarrhea since the start of tube feed on 5/25.  Negative GI pathogen panel Negative c diff Follow, adjusting feeds per dietitian   Abnormal LFTs Mildly elevated -LFTs trending down. -Follow-up.  Pressure ulcer Pressure Injury 01/24/22 Buttocks Deep Tissue Pressure Injury - Purple or maroon localized area of discolored intact skin or  blood-filled blister due to damage of underlying soft tissue from pressure and/or shear. (Active)  01/24/22 2036  Location: Buttocks  Location Orientation:   Staging: Deep Tissue Pressure Injury - Purple or maroon localized area of discolored intact skin or blood-filled blister due to damage of underlying soft tissue from pressure and/or shear.  Wound Description (Comments):   Present on Admission: Yes   Wound c/s   Goals of care, counseling/discussion Not currently interested in palliative care discussions, noted we can review pending course         DVT prophylaxis: Eliquis Code Status: Full. Family Communication: Updated daughter at bedside. Disposition: CIR versus SNF  Status is: Inpatient Remains inpatient appropriate because: Severity of illness   Consultants:  Neurosurgery: Dr. Ellene Route 01/28/2022 General surgery: Dr. Kieth Brightly 01/26/2022 Wound care RN   Procedures:  CT abdomen and pelvis 01/24/2022 Plain films of the cervical spine 01/27/2022 Fluoroscopic guided replacement of gastrostomy tube with upsizing 01/25/2022 interventional radiology 2D echo 01/27/2022    Antimicrobials:  Diflucan 01/25/2022>>>> 02/02/2022 IV Ancef 01/27/2022>>>>> 02/02/2022 IV Rocephin 01/27/2022 x 1 dose IV Merrem 01/24/2022>>>> 01/27/2022 IV vancomycin 01/24/2022>>>>> 01/27/2022    Subjective: Sleeping but arousable.  Denies any chest pain.  No shortness of breath.  No abdominal pain.  Daughter at bedside.    Objective: Vitals:   01/29/22  0348 01/29/22 0723 01/29/22 0922 01/29/22 1620  BP: (!) 114/56 (!) 120/57  (!) 120/46  Pulse: 68 66  (!) 55  Resp: '17 16  18  '$ Temp: 97.7 F (36.5 C) 98.2 F (36.8 C)  97.7 F (36.5 C)  TempSrc: Axillary   Oral  SpO2: 100% 99% 99% 100%  Weight:      Height:        Intake/Output Summary (Last 24 hours) at 01/29/2022 2037 Last data filed at 01/29/2022 1806 Gross per 24 hour  Intake 1953.19 ml  Output 1550 ml  Net 403.19 ml   Filed Weights   01/24/22 2324   Weight: 86.3 kg    Examination:  General exam: Appears calm and comfortable.  Dry mucous membranes. Cortrack noted. Respiratory system: Clear to auscultation anterior lung fields.[name] Respiratory effort normal. Cardiovascular system: S1 & S2 heard, RRR. No JVD, murmurs, rubs, gallops or clicks. No pedal edema. Gastrointestinal system: Abdomen is nondistended, soft and nontender. No organomegaly or masses felt. Normal bowel sounds heard.  G-tube noted with some purulent discharge and to gravity. Central nervous system: Alert and oriented. No focal neurological deficits. Extremities: Symmetric 5 x 5 power. Skin: No rashes, lesions or ulcers Psychiatry: Judgement and insight appear normal. Mood & affect appropriate.     Data Reviewed:   CBC: Recent Labs  Lab 01/24/22 1524 01/25/22 0106 01/25/22 1429 01/26/22 0037 01/27/22 0313 01/28/22 0146 01/29/22 0243  WBC 6.4 6.2  --  4.1 4.5 5.9 4.7  NEUTROABS 4.8  --   --  2.9  --  3.3 2.8  HGB 7.7* 7.1* 7.6* 7.2* 7.4* 7.7* 7.4*  HCT 25.8* 23.1* 25.1* 23.3* 23.0* 25.4* 24.5*  MCV 102.8* 100.4*  --  100.4* 98.3 100.0 100.8*  PLT 115* 107*  --  98* 103* 113* 106*    Basic Metabolic Panel: Recent Labs  Lab 01/25/22 0106 01/26/22 0037 01/27/22 0313 01/28/22 0146 01/29/22 0243 01/29/22 1635  NA 139 141 143 146* 144  --   K 4.3 3.8 3.7 3.4* 3.3*  --   CL 108 109 112* 112* 108  --   CO2 '25 26 25 23 26  '$ --   GLUCOSE 118* 115* 108* 127* 145*  --   BUN 39* 41* 32* 27* 31*  --   CREATININE 1.09 1.06 0.99 1.05 0.93  --   CALCIUM 9.5 9.3 9.2 9.3 8.8*  --   MG  --  1.9  --  2.1 2.1 2.0  PHOS  --  2.3*  --  2.3* 2.4* 2.5    GFR: Estimated Creatinine Clearance: 60.7 mL/min (by C-G formula based on SCr of 0.93 mg/dL).  Liver Function Tests: Recent Labs  Lab 01/25/22 0106 01/26/22 0037 01/27/22 0313 01/28/22 0433 01/29/22 0243  AST 104* 113* 83* 77* 51*  ALT 107* 107* 92* 70* 31  ALKPHOS 133* 127* 112 119 111  BILITOT 0.5 0.4  0.8 0.6 0.5  PROT 4.8* 4.4* 4.2* 4.7* 4.2*  ALBUMIN 1.8* 1.6* 1.6* 1.6* 1.7*    CBG: Recent Labs  Lab 01/28/22 1235 01/28/22 1747 01/29/22 0001 01/29/22 1129 01/29/22 1654  GLUCAP 144* 154* 149* 145* 117*     Recent Results (from the past 240 hour(s))  Urine Culture     Status: Abnormal   Collection Time: 01/24/22  3:12 PM   Specimen: Urine, Clean Catch  Result Value Ref Range Status   Specimen Description URINE, CLEAN CATCH  Final   Special Requests   Final  NONE Performed at [facility], [place] [address], [place], [place] [address]    Culture (A)  Final    40,000 COLONIES/mL ENTEROCOCCUS FAECIUM VANCOMYCIN RESISTANT ENTEROCOCCUS ISOLATED    Report Status 01/27/2022 FINAL  Final   Organism ID, Bacteria ENTEROCOCCUS FAECIUM (A)  Final      Susceptibility   Enterococcus faecium - MIC*    AMPICILLIN >=32 RESISTANT Resistant     NITROFURANTOIN 64 INTERMEDIATE Intermediate     VANCOMYCIN >=32 RESISTANT Resistant     LINEZOLID 2 SENSITIVE Sensitive     * 40,000 COLONIES/mL ENTEROCOCCUS FAECIUM  Blood culture (routine x 2)     Status: None   Collection Time: 01/24/22  3:24 PM   Specimen: BLOOD LEFT HAND  Result Value Ref Range Status   Specimen Description BLOOD LEFT HAND  Final   Special Requests   Final    BOTTLES DRAWN AEROBIC AND ANAEROBIC Blood Culture adequate volume   Culture   Final    NO GROWTH 5 DAYS Performed at [facility], [place] [address] [place], [place] [address]    Report Status 01/29/2022 FINAL  Final  Blood culture (routine x 2)     Status: None   Collection Time: 01/24/22  3:24 PM   Specimen: BLOOD RIGHT HAND  Result Value Ref Range Status   Specimen Description BLOOD RIGHT HAND  Final   Special Requests   Final    BOTTLES DRAWN AEROBIC AND ANAEROBIC Blood Culture adequate volume   Culture   Final    NO GROWTH 5 DAYS Performed at [facility], [place] [address], [place], [place] [address]    Report Status 01/29/2022 FINAL   Final  Gastrointestinal Panel by PCR , Stool     Status: None   Collection Time: 01/24/22  9:08 PM   Specimen: Peg Site; Stool  Result Value Ref Range Status   Campylobacter species NOT DETECTED NOT DETECTED Final   Plesimonas shigelloides NOT DETECTED NOT DETECTED Final   Salmonella species NOT DETECTED NOT DETECTED Final   Yersinia enterocolitica NOT DETECTED NOT DETECTED Final   Vibrio species NOT DETECTED NOT DETECTED Final   Vibrio cholerae NOT DETECTED NOT DETECTED Final   Enteroaggregative E coli (EAEC) NOT DETECTED NOT DETECTED Final   Enteropathogenic E coli (EPEC) NOT DETECTED NOT DETECTED Final   Enterotoxigenic E coli (ETEC) NOT DETECTED NOT DETECTED Final   Shiga like toxin producing E coli (STEC) NOT DETECTED NOT DETECTED Final   Shigella/Enteroinvasive E coli (EIEC) NOT DETECTED NOT DETECTED Final   Cryptosporidium NOT DETECTED NOT DETECTED Final   Cyclospora cayetanensis NOT DETECTED NOT DETECTED Final   Entamoeba histolytica NOT DETECTED NOT DETECTED Final   Giardia lamblia NOT DETECTED NOT DETECTED Final   Adenovirus F40/41 NOT DETECTED NOT DETECTED Final   Astrovirus NOT DETECTED NOT DETECTED Final   Norovirus GI/GII NOT DETECTED NOT DETECTED Final   Rotavirus A NOT DETECTED NOT DETECTED Final   Sapovirus (I, II, IV, and V) NOT DETECTED NOT DETECTED Final    Comment: Performed at [facility], [place] [place], [place] [address]  C Difficile Quick Screen w PCR reflex     Status: None   Collection Time: 01/24/22  9:08 PM   Specimen: Peg Site; Stool  Result Value Ref Range Status   C Diff antigen NEGATIVE NEGATIVE Final   C Diff toxin NEGATIVE NEGATIVE Final   C Diff interpretation No C. difficile detected.  Final  Comment: Performed at [facility], [place] [address] [place], [place] [address]  Aerobic Culture w Gram Stain (superficial specimen)     Status: None   Collection Time: 01/24/22 10:07 PM   Specimen: Wound  Result Value Ref Range  Status   Specimen Description WOUND  Final   Special Requests PEG SITE  Final   Gram Stain   Final    NO SQUAMOUS EPITHELIAL CELLS SEEN FEW WBC SEEN FEW GRAM POSITIVE COCCI ABUNDANT GRAM NEGATIVE RODS Performed at [facility], [address] [place], [place] [address]    Culture   Final    MODERATE STAPHYLOCOCCUS EPIDERMIDIS MODERATE CANDIDA ALBICANS    Report Status 01/27/2022 FINAL  Final   Organism ID, Bacteria STAPHYLOCOCCUS EPIDERMIDIS  Final      Susceptibility   Staphylococcus epidermidis - MIC*    CIPROFLOXACIN >=8 RESISTANT Resistant     ERYTHROMYCIN <=0.25 SENSITIVE Sensitive     GENTAMICIN <=0.5 SENSITIVE Sensitive     OXACILLIN RESISTANT Resistant     TETRACYCLINE <=1 SENSITIVE Sensitive     VANCOMYCIN 2 SENSITIVE Sensitive     TRIMETH/SULFA 20 SENSITIVE Sensitive     CLINDAMYCIN >=8 RESISTANT Resistant     RIFAMPIN <=0.5 SENSITIVE Sensitive     Inducible Clindamycin NEGATIVE Sensitive     * MODERATE STAPHYLOCOCCUS EPIDERMIDIS         Radiology Studies: No results found.      Scheduled Meds:  acetaminophen  1,000 mg Per Tube Q8H   amiodarone  200 mg Per Tube Daily   apixaban  5 mg Per Tube BID   Chlorhexidine Gluconate Cloth  6 each Topical Q0600   doxazosin  1 mg Per Tube Daily   famotidine  20 mg Per Tube BID   feeding supplement (KATE FARMS STANDARD 1.4)  325 mL Per Tube Q8H   fluconazole  100 mg Per Tube Daily   fluticasone furoate-vilanterol  1 puff Inhalation Daily   free water  120 mL Per Tube Q4H   leptospermum manuka honey  1 application. Topical BID   mirtazapine  7.5 mg Per Tube QHS   nutrition supplement (JUVEN)  1 packet Per Tube BID BM   venlafaxine  18.75 mg Per Tube BID WC   Zinc Oxide   Topical BID   Continuous Infusions:   ceFAZolin (ANCEF) IV 2 g (01/29/22 1312)   dextrose 5 % and 0.45% NaCl 75 mL/hr at 01/29/22 0234     LOS: 5 days    Time spent: 35 minutes    Irine Seal, MD [facility]   To  contact the attending provider between 7A-7P or the covering provider during after hours 7P-7A, please log into the web site [email or web address] and access using universal [place] password for that web site. If you do not have the password, please call the hospital operator.  01/29/2022, 8:37 PM

## 2022-01-29 NOTE — Progress Notes (Signed)
Family concerned about skin surrounding G tube which is constantly draining. There is no way to keep this area dry as it is presently.  WOC consulted to evaluate. Is there any way to pouch it?

## 2022-01-29 NOTE — Progress Notes (Signed)
PT Cancellation Note  Patient Details Name: [name] MRN: [id number] DOB: 16-Aug-1933   Cancelled Treatment:    Reason Eval/Treat Not Completed: Medical issues which prohibited therapy (Notes from surgery that state G tube to gravity and not to move it at the site.  Await MD clarification of activity level prior to mobilizing pt.)   Alvira Philips 01/29/2022, 11:49 AM Noraa Pickeral M,PT Acute Rehab Services [phone]

## 2022-01-29 NOTE — Progress Notes (Signed)
Pt having constant diarrhea, cleaned and turned. If this continues, may consider a fecal incontinence tube in order to keep the skin dry. Dressing around G tube changed again with some clear drainage, some mucous drainage and some drainage which is dark brown in color. Family at bedside expressed concerns about what the plan of care will be going forward.

## 2022-01-29 NOTE — Progress Notes (Signed)
   Inpatient Rehab Admissions Coordinator :  Per therapy recommendations patient was screened for CIR candidacy by Dlynn Ranes RN MSN. Patient is not yet at a level to tolerate the intensity required to pursue a CIR admit . The CIR admissions team will follow and monitor for progress and place a Rehab Consult order if felt to be appropriate. Please contact me with any questions.  Yerlin Gasparyan RN MSN Admissions Coordinator [phone]  

## 2022-01-29 NOTE — Evaluation (Signed)
Occupational Therapy Evaluation Patient Details Name: [name] MRN: [id number] DOB: 10-22-1932 Today's Date: 01/29/2022   History of Present Illness [name] is a 88 y.o. male who presented with purulent discharge from feeding tube. Due to likely pressure wound related to G tube, IR upsized G tube on 6/3 and cortrak was placed for temporary nutrition alternative. PMH: atrial fibrillation, chronic systolic CHF, CAD s/p CABG, sick sinus syndrome s/p pacemaker, COPD, type 2 diabetes, hypertension, hyperlipidemia, chronic thrombocytopenia, recurrent UTI, OSA not on CPAP, dysphagia with PEG tube , hx of C6 fx   Clinical Impression   Pt presents with diagnoses above and deficits in strength, balance, coordination and cognition. Pt's daughter present, hands on to assist throughout session. Overall, pt requires Max-Total A x 2 for bed mobility but once EOB, able to maintain balance with primarily L UE support. Pt with delayed motor planning but able to participate in light ADLs/exercises sitting EOB. Attempted standing at bedside with pt able to clear bottom but unable to fully stand with +2 assist today. Pt currently requires up to max A for UB ADL and Total A for LB ADLs. Pt's daughter reports bad previous SNF experience, but may be open to AIR level therapies pending pt activity tolerance progression. Daughter endorses ability to provide light-moderate physical assist at discharge. Will continue to follow acutely with plans to progress standing trials, sequencing ADLs, and overall endurance in next sessions.      Recommendations for follow up therapy are one component of a multi-disciplinary discharge planning process, led by the attending physician.  Recommendations may be updated based on patient status, additional functional criteria and insurance authorization.   Follow Up Recommendations  Acute inpatient rehab (3hours/day) (pending activity tolerance improvements)    Assistance Recommended  at Discharge Frequent or constant Supervision/Assistance  Patient can return home with the following Two people to help with walking and/or transfers;A lot of help with bathing/dressing/bathroom;Direct supervision/assist for medications management;Direct supervision/assist for financial management;Assistance with feeding;Assist for transportation;Help with stairs or ramp for entrance    Functional Status Assessment  Patient has had a recent decline in their functional status and demonstrates the ability to make significant improvements in function in a reasonable and predictable amount of time.  Equipment Recommendations  Other (comment) (air mattress overlay)    Recommendations for Other Services Other (comment) (palliative consult)     Precautions / Restrictions Precautions Precautions: Fall;Other (comment) Precaution Comments: G tube draining to gravity (ok to mobilize with therapy per MD); soft collar discontinued Restrictions Weight Bearing Restrictions: No      Mobility Bed Mobility Overal bed mobility: Needs Assistance Bed Mobility: Supine to Sit, Sit to Supine     Supine to sit: Max assist, +2 for physical assistance, +2 for safety/equipment Sit to supine: Total assist, +2 for physical assistance, +2 for safety/equipment   General bed mobility comments: max cues for sequencing and assist to initiate LEs to EOB. Pt assisting some to lift trunk via handheld assist though significant assist needed to get EOB. Total A x 2 to get back to bed with pt difficulty sequencing    Transfers Overall transfer level: Needs assistance                 General transfer comment: Attempted standing at bedside with +2 assist, pt able to partially lift bottom from bed for linens to be changed but unable to achieve full standing      Balance Overall balance assessment: Needs assistance Sitting-balance support:  No upper extremity supported, Feet supported, Single extremity  supported Sitting balance-Leahy Scale: Poor Sitting balance - Comments: reliant on UE support EOB (primarily of L UE) but able to maintain balance without external support                                   ADL either performed or assessed with clinical judgement   ADL Overall ADL's : Needs assistance/impaired Eating/Feeding: Moderate assistance   Grooming: Moderate assistance;Sitting   Upper Body Bathing: Maximal assistance;Sitting   Lower Body Bathing: Total assistance   Upper Body Dressing : Maximal assistance;Sitting;Bed level   Lower Body Dressing: Total assistance       Toileting- Clothing Manipulation and Hygiene: Total assistance         General ADL Comments: Able to maintain sitting balance while EOB though significant weakness and confusion requiring extensive assist for ADLs     Vision Baseline Vision/History: 1 Wears glasses Ability to See in Adequate Light: 1 Impaired Patient Visual Report: Blurring of vision Vision Assessment?: No apparent visual deficits     Perception     Praxis      Pertinent Vitals/Pain Pain Assessment Pain Assessment: Faces Faces Pain Scale: Hurts little more Pain Location: back/bottom with LE ROM Pain Descriptors / Indicators: Moaning Pain Intervention(s): Monitored during session     Hand Dominance Right   Extremity/Trunk Assessment Upper Extremity Assessment Upper Extremity Assessment: Difficult to assess due to impaired cognition;RUE deficits/detail;LUE deficits/detail RUE Deficits / Details: crepitation felt in shoulder with PROM up to 85*. difficulty lifting UE to command. grasp 4/5 RUE Coordination: decreased fine motor;decreased gross motor LUE Deficits / Details: initially reliant on L UE support sitting EOB. However, once back in bed and attempting to assess strength/ROM, pt unable to lift UE to command LUE Coordination: decreased fine motor;decreased gross motor   Lower Extremity Assessment Lower  Extremity Assessment: Defer to PT evaluation   Cervical / Trunk Assessment Cervical / Trunk Assessment: Kyphotic   Communication Communication Communication: HOH   Cognition Arousal/Alertness: Awake/alert Behavior During Therapy: Flat affect, Restless, Anxious Overall Cognitive Status: Impaired/Different from baseline Area of Impairment: Orientation, Attention, Memory, Following commands, Safety/judgement, Awareness, Problem solving                 Orientation Level: Disoriented to, Time, Situation Current Attention Level: Sustained Memory: Decreased recall of precautions, Decreased short-term memory Following Commands: Follows one step commands with increased time Safety/Judgement: Decreased awareness of safety, Decreased awareness of deficits Awareness: Intellectual Problem Solving: Slow processing, Decreased initiation, Requires verbal cues, Requires tactile cues General Comments: kept eyes closed much of session, follows one step directions, yells out at times (more so due to surprise with movement than pain?), demo some awareness as he expressed desire to change wet gown from PEG tube drainage. Slow processing to initiate tasks, slower motor planning. Pt had received morphine overnight and pt's daughter questions if this has impacted cognition     General Comments  Daughter present, hands on to assist. would like to avoid SNF at [place]    Exercises     Shoulder Instructions      Home Living Family/patient expects to be discharged to:: Private residence Living Arrangements: Children;Spouse/significant other;Other relatives Available Help at Discharge: Family;Available 24 hours/day Type of Home: House Home Access: Stairs to enter [employer] of Steps: 1 through garage and 1 at [employer]: Two level;Laundry or work  area in basement     Bathroom Shower/Tub: Occupational psychologist: Standard     Home Equipment: Conservation officer, nature (2  wheels);Shower seat;BSC/3in1;Cane - single point;Cane - quad;Rollator (4 wheels);Grab bars - tub/shower;Hand held shower head;Hospital bed (lift chair, upright walker)          Prior Functioning/Environment Prior Level of Function : Needs assist             Mobility Comments: was walking 12 ft when he last left [facility], prior to hospitalization 4/18-25 was mod I with RW/SPC ADLs Comments: prior to initial hospitalization he was independent in ADL and wife did IADL, steady decline with DC to SNF rehab with previous admission approx 3 weeks ago        OT Problem List: Decreased strength;Decreased activity tolerance;Impaired balance (sitting and/or standing);Decreased safety awareness;Decreased knowledge of precautions;Decreased knowledge of use of DME or AE;Cardiopulmonary status limiting activity;Pain;Impaired UE functional use      OT Treatment/Interventions: Self-care/ADL training;Therapeutic exercise;Energy conservation;DME and/or AE instruction;Therapeutic activities;Patient/family education;Balance training    OT Goals(Current goals can be found in the care plan section) Acute Rehab OT Goals Patient Stated Goal: family would like to take pt home, be able to at minimum transfer with light assist OT Goal Formulation: With patient/family Time For Goal Achievement: 02/12/22 Potential to Achieve Goals: Fair  OT Frequency: Min 2X/week    Co-evaluation PT/OT/SLP Co-Evaluation/Treatment: Yes Reason for Co-Treatment: Necessary to address cognition/behavior during functional activity;For patient/therapist safety;To address functional/ADL transfers;Complexity of the patient's impairments (multi-system involvement)   OT goals addressed during session: ADL's and self-care      AM-PAC OT "6 Clicks" Daily Activity     Outcome Measure Help from another person eating meals?: A Lot Help from another person taking care of personal grooming?: A Lot Help from another person toileting, which  includes using toliet, bedpan, or urinal?: Total Help from another person bathing (including washing, rinsing, drying)?: A Lot Help from another person to put on and taking off regular upper body clothing?: A Lot Help from another person to put on and taking off regular lower body clothing?: Total 6 Click Score: 10   End of Session Nurse Communication: Mobility status;Other (comment) (drainage from G tube)  Activity Tolerance: Patient limited by fatigue Patient left: in bed;with call bell/phone within reach;with family/visitor present  OT Visit Diagnosis: Unsteadiness on feet (R26.81);Other abnormalities of gait and mobility (R26.89);Muscle weakness (generalized) (M62.81);History of falling (Z91.81);Feeding difficulties (R63.3);Other symptoms and signs involving cognitive function;Adult, failure to thrive (R62.7);Pain Pain - part of body:  (bottom/back)                Time: 2122-4825 OT Time Calculation (min): 25 min Charges:  OT General Charges $OT Visit: 1 Visit OT Evaluation $OT Eval Moderate Complexity: 1 Mod  Malachy Chamber, OTR/L Acute Rehab Services Office: [phone]   Layla Maw 01/29/2022, 1:59 PM

## 2022-01-29 NOTE — Evaluation (Signed)
Physical Therapy Evaluation Patient Details Name: [name] MRN: [id number] DOB: 10-22-1932 Today's Date: 01/29/2022  History of Present Illness  [name] is a 88 y.o. male who presented with purulent discharge from feeding tube. Due to likely pressure wound related to G tube, IR upsized G tube on 6/3 and cortrak was placed for temporary nutrition alternative. PMH: atrial fibrillation, chronic systolic CHF, CAD s/p CABG, sick sinus syndrome s/p pacemaker, COPD, type 2 diabetes, hypertension, hyperlipidemia, chronic thrombocytopenia, recurrent UTI, OSA not on CPAP, dysphagia with PEG tube , hx of C6 fx  Clinical Impression  Pt admitted with above diagnosis. Pt able to sit EOB x 10 min with min guard to min assist. Daughter supportive and wants to take pt home. Will try to see if AIR is possible for this medically complex case. Pt has potential to make gains given right setting. Will follow acutely.  Pt currently with functional limitations due to the deficits listed below (see PT Problem List). Pt will benefit from skilled PT to increase their independence and safety with mobility to allow discharge to the venue listed below.          Recommendations for follow up therapy are one component of a multi-disciplinary discharge planning process, led by the attending physician.  Recommendations may be updated based on patient status, additional functional criteria and insurance authorization.  Follow Up Recommendations Acute inpatient rehab (3hours/day) (family electing to D/C home)    Assistance Recommended at Discharge Frequent or constant Supervision/Assistance  Patient can return home with the following  Two people to help with walking and/or transfers;Two people to help with bathing/dressing/bathroom;Assistance with cooking/housework;Assistance with feeding;Direct supervision/assist for medications management;Assist for transportation;Direct supervision/assist for financial management;Help  with stairs or ramp for entrance    Equipment Recommendations Wheelchair (measurements PT);Other (comment);Wheelchair cushion (measurements PT) (hoyer lift, air mattress overlay for bed)  Recommendations for Other Services  Rehab consult (family requesting)    Functional Status Assessment Patient has had a recent decline in their functional status and demonstrates the ability to make significant improvements in function in a reasonable and predictable amount of time.     Precautions / Restrictions Precautions Precautions: Fall;Other (comment) Precaution Comments: G tube draining to gravity (ok to mobilize with therapy per MD); soft collar discontinued Restrictions Weight Bearing Restrictions: No      Mobility  Bed Mobility Overal bed mobility: Needs Assistance Bed Mobility: Supine to Sit, Sit to Supine     Supine to sit: Max assist, +2 for physical assistance, +2 for safety/equipment Sit to supine: Total assist, +2 for physical assistance, +2 for safety/equipment   General bed mobility comments: max cues for sequencing and assist to initiate LEs to EOB. Pt assisting some to lift trunk via handheld assist though significant assist needed to get EOB. Total A x 2 to get back to bed with pt difficulty sequencing    Transfers Overall transfer level: Needs assistance Equipment used: Rolling walker (2 wheels) Transfers: Sit to/from Stand Sit to Stand: +2 physical assistance, From elevated surface, Total assist     Squat pivot transfers: +2 physical assistance, Total assist     General transfer comment: Attempted standing at bedside with +2 assist, pt able to partially lift bottom from bed for linens to be changed but unable to achieve full standing    Ambulation/Gait               General Gait Details: unable today  Stairs  Wheelchair Mobility    Modified Rankin (Stroke Patients Only)       Balance Overall balance assessment: Needs  assistance Sitting-balance support: No upper extremity supported, Feet supported, Single extremity supported Sitting balance-Leahy Scale: Poor Sitting balance - Comments: reliant on UE support EOB (primarily of L UE) but able to maintain balance without external support for up to 10 min EOB.  Pt at times leaning posterior.  Pt needed support at trunk when asked to move extermities in sititng   Standing balance support: Bilateral upper extremity supported, Reliant on assistive device for balance Standing balance-Leahy Scale: Zero Standing balance comment: could not clear buttocks off bed more than 1 inch                             Pertinent Vitals/Pain Pain Assessment Pain Assessment: Faces Faces Pain Scale: Hurts little more Breathing: normal Negative Vocalization: occasional moan/groan, low speech, negative/disapproving quality Facial Expression: smiling or inexpressive Body Language: relaxed Consolability: no need to console PAINAD Score: 1 Pain Location: back/bottom with LE ROM Pain Descriptors / Indicators: Moaning Pain Intervention(s): Limited activity within patient's tolerance, Monitored during session, Repositioned    Home Living Family/patient expects to be discharged to:: Private residence Living Arrangements: Children;Spouse/significant other;Other relatives Available Help at Discharge: Family;Available 24 hours/day Type of Home: House Home Access: Stairs to enter   [employer] of Steps: 1 through garage and 1 at sunroom Alternate Level Stairs-Number of Steps: 15 stairs Home Layout: Two level;Laundry or work area in [place]: Conservation officer, nature (2 wheels);Shower seat;BSC/3in1;Cane - single point;Cane - quad;Rollator (4 wheels);Grab bars - tub/shower;Hand held shower head;Hospital bed (lift chair, upright walker) Additional Comments: Has about 12 walking sticks.    Prior Function Prior Level of Function : Needs assist        Physical Assist : ADLs (physical)     Mobility Comments: was walking 12 ft when he last left [facility], prior to hospitalization 4/18-25 was mod I with RW/SPC ADLs Comments: prior to initial hospitalization he was independent in ADL and wife did IADL, steady decline with DC to SNF rehab with previous admission approx 3 weeks ago     Hand Dominance   Dominant Hand: Right    Extremity/Trunk Assessment   Upper Extremity Assessment Upper Extremity Assessment: Defer to OT evaluation RUE Deficits / Details: crepitation felt in shoulder with PROM up to 85*. difficulty lifting UE to command. grasp 4/5 RUE Coordination: decreased fine motor;decreased gross motor LUE Deficits / Details: initially reliant on L UE support sitting EOB. However, once back in bed and attempting to assess strength/ROM, pt unable to lift UE to command LUE Coordination: decreased fine motor;decreased gross motor    Lower Extremity Assessment Lower Extremity Assessment: RLE deficits/detail;LLE deficits/detail RLE Deficits / Details: 2-/5 grossly LLE Deficits / Details: 3-/5 grossly    Cervical / Trunk Assessment Cervical / Trunk Assessment: Kyphotic  Communication   Communication: HOH  Cognition Arousal/Alertness: Awake/alert Behavior During Therapy: Flat affect, Restless, Anxious Overall Cognitive Status: Impaired/Different from baseline Area of Impairment: Orientation, Attention, Memory, Following commands, Safety/judgement, Awareness, Problem solving                 Orientation Level: Disoriented to, Time, Situation Current Attention Level: Sustained Memory: Decreased recall of precautions, Decreased short-term memory Following Commands: Follows one step commands with increased time Safety/Judgement: Decreased awareness of safety, Decreased awareness of deficits Awareness: Intellectual Problem Solving: Slow processing, Decreased initiation, Requires  verbal cues, Requires tactile cues General Comments:  kept eyes closed much of session, follows one step directions, yells out at times (more so due to surprise with movement than pain?), demo some awareness as he expressed desire to change wet gown from PEG tube drainage. Slow processing to initiate tasks, slower motor planning. Pt had received morphine overnight and pt's daughter questions if this has impacted cognition        General Comments General comments (skin integrity, edema, etc.): Daughter present, hands on to assist. would like to avoid SNF at [place]    Exercises General Exercises - Lower Extremity Heel Slides: AAROM, Both, 5 reps, Supine   Assessment/Plan    PT Assessment Patient needs continued PT services  PT Problem List Decreased strength;Decreased activity tolerance;Decreased range of motion;Decreased balance;Decreased mobility;Decreased coordination;Decreased cognition;Decreased knowledge of use of DME;Decreased safety awareness;Decreased knowledge of precautions;Pain       PT Treatment Interventions DME instruction;Gait training;Stair training;Functional mobility training;Therapeutic activities;Therapeutic exercise;Balance training;Cognitive remediation;Patient/family education    PT Goals (Current goals can be found in the Care Plan section)  Acute Rehab PT Goals Patient Stated Goal: to eventually return home PT Goal Formulation: With patient Time For Goal Achievement: 02/12/22 Potential to Achieve Goals: Fair    Frequency Min 3X/week     Co-evaluation PT/OT/SLP Co-Evaluation/Treatment: Yes Reason for Co-Treatment: Necessary to address cognition/behavior during functional activity;For patient/therapist safety PT goals addressed during session: Mobility/safety with mobility OT goals addressed during session: ADL's and self-care       AM-PAC PT "6 Clicks" Mobility  Outcome Measure Help needed turning from your back to your side while in a flat bed without using bedrails?: Total Help needed moving from lying on  your back to sitting on the side of a flat bed without using bedrails?: Total Help needed moving to and from a bed to a chair (including a wheelchair)?: Total Help needed standing up from a chair using your arms (e.g., wheelchair or bedside chair)?: Total Help needed to walk in hospital room?: Total Help needed climbing 3-5 steps with a railing? : Total 6 Click Score: 6    End of Session   Activity Tolerance: Patient limited by pain;Patient limited by fatigue Patient left: in bed;with call bell/phone within reach;with bed alarm set;with family/visitor present Nurse Communication: Mobility status;Need for lift equipment PT Visit Diagnosis: History of falling (Z91.81);Pain;Muscle weakness (generalized) (M62.81);Unsteadiness on feet (R26.81)    Time: 2334-3568 PT Time Calculation (min) (ACUTE ONLY): 30 min   Charges:   PT Evaluation $PT Eval Moderate Complexity: 1 Mod          Jakyria Bleau M,PT Acute Rehab Services [phone]   Alvira Philips 01/29/2022, 2:15 PM

## 2022-01-30 DIAGNOSIS — L03818 Cellulitis of other sites: Secondary | ICD-10-CM | POA: Diagnosis not present

## 2022-01-30 DIAGNOSIS — R7989 Other specified abnormal findings of blood chemistry: Secondary | ICD-10-CM | POA: Diagnosis not present

## 2022-01-30 DIAGNOSIS — L03311 Cellulitis of abdominal wall: Secondary | ICD-10-CM | POA: Diagnosis not present

## 2022-01-30 DIAGNOSIS — D649 Anemia, unspecified: Secondary | ICD-10-CM | POA: Diagnosis not present

## 2022-01-30 LAB — COMPREHENSIVE METABOLIC PANEL
ALT: 14 U/L (ref 0–44)
AST: 39 U/L (ref 15–41)
Albumin: 1.7 g/dL — ABNORMAL LOW (ref 3.5–5.0)
Alkaline Phosphatase: 105 U/L (ref 38–126)
Anion gap: 8 (ref 5–15)
BUN: 28 mg/dL — ABNORMAL HIGH (ref 8–23)
CO2: 27 mmol/L (ref 22–32)
Calcium: 8.5 mg/dL — ABNORMAL LOW (ref 8.9–10.3)
Chloride: 108 mmol/L (ref 98–111)
Creatinine, Ser: 0.85 mg/dL (ref 0.61–1.24)
GFR, Estimated: >60 mL/min (ref >60)
Glucose, Bld: 135 mg/dL — ABNORMAL HIGH (ref 70–99)
Potassium: 3.6 mmol/L (ref 3.5–5.1)
Sodium: 143 mmol/L (ref 135–145)
Total Bilirubin: 0.7 mg/dL (ref 0.3–1.2)
Total Protein: 4.4 g/dL — ABNORMAL LOW (ref 6.5–8.1)

## 2022-01-30 LAB — CBC WITH DIFFERENTIAL/PLATELET
Abs Immature Granulocytes: 0.24 10*3/uL — ABNORMAL HIGH (ref 0.00–0.07)
Basophils Absolute: 0 10*3/uL (ref 0.0–0.1)
Basophils Relative: 0 %
Eosinophils Absolute: 0.2 10*3/uL (ref 0.0–0.5)
Eosinophils Relative: 3 %
HCT: 27.4 % — ABNORMAL LOW (ref 39.0–52.0)
Hemoglobin: 8.3 g/dL — ABNORMAL LOW (ref 13.0–17.0)
Immature Granulocytes: 4 %
Lymphocytes Relative: 30 %
Lymphs Abs: 1.7 10*3/uL (ref 0.7–4.0)
MCH: 30.4 pg (ref 26.0–34.0)
MCHC: 30.3 g/dL (ref 30.0–36.0)
MCV: 100.4 fL — ABNORMAL HIGH (ref 80.0–100.0)
Monocytes Absolute: 0.4 10*3/uL (ref 0.1–1.0)
Monocytes Relative: 7 %
Neutro Abs: 3.2 10*3/uL (ref 1.7–7.7)
Neutrophils Relative %: 56 %
Platelets: 108 10*3/uL — ABNORMAL LOW (ref 150–400)
RBC: 2.73 MIL/uL — ABNORMAL LOW (ref 4.22–5.81)
RDW: 18.4 % — ABNORMAL HIGH (ref 11.5–15.5)
Smear Review: DECREASED
WBC: 5.8 10*3/uL (ref 4.0–10.5)
nRBC: 0 % (ref 0.0–0.2)

## 2022-01-30 LAB — MAGNESIUM: Magnesium: 2.1 mg/dL (ref 1.7–2.4)

## 2022-01-30 LAB — PHOSPHORUS: Phosphorus: 2.8 mg/dL (ref 2.5–4.6)

## 2022-01-30 LAB — GLUCOSE, CAPILLARY
Glucose-Capillary: 121 mg/dL — ABNORMAL HIGH (ref 70–99)
Glucose-Capillary: 122 mg/dL — ABNORMAL HIGH (ref 70–99)
Glucose-Capillary: 131 mg/dL — ABNORMAL HIGH (ref 70–99)
Glucose-Capillary: 138 mg/dL — ABNORMAL HIGH (ref 70–99)

## 2022-01-30 MED ORDER — VANCOMYCIN HCL 1500 MG/300ML IV SOLN: 1500.0000 mg | Freq: Once | INTRAVENOUS | Status: DC

## 2022-01-30 MED ORDER — SODIUM CHLORIDE 0.9 % IV SOLN: 1.0000 g | Freq: Once | INTRAVENOUS | Status: DC

## 2022-01-30 MED ORDER — CEFAZOLIN SODIUM-DEXTROSE 2-4 GM/100ML-% IV SOLN
2.0000 g | Freq: Three times a day (TID) | INTRAVENOUS | Status: AC
  Administered 2022-01-30 – 2022-02-02 (×9): 2 g via INTRAVENOUS
  Filled 2022-01-30 (×9): qty 100

## 2022-01-30 MED ORDER — FLUCONAZOLE 100 MG PO TABS
200.0000 mg | ORAL_TABLET | Freq: Every day | ORAL | Status: AC
  Administered 2022-01-31 – 2022-02-03 (×3): 200 mg
  Filled 2022-01-30 (×4): qty 2

## 2022-01-30 NOTE — Progress Notes (Signed)
Pt family called requesting the Surgeon to come and see the pt wound ( around his G-tube) According to the family, the wound looks different from 3 pm to  1930. Pt daughter stated that the wound picture had gotten worse from 3 pm to 1930. On call provider notified about the family concern at 2130.  At [place], On call provider arrived and evaluated the pt  and made some changes to pt IV antibiotics. (See MAR for details.). On call provider also requested that the surgeon be contacted for further evaluation of the pt wound. General surgery contacted who stated they will follow up with the pt in the morning.

## 2022-01-30 NOTE — Progress Notes (Addendum)
PROGRESS NOTE    [name]  RDE:[id number] DOB: February 09, 1933 DOA: 01/24/2022 PCP: [name], DO    Chief Complaint  Patient presents with   feeding tube issue     Brief Narrative:  [name] is a 88 y.o. male with medical history significant of paroxysmal atrial fibrillation, chronic systolic CHF, CAD s/p CABG, sick sinus syndrome s/p pacemaker, COPD, type 2 diabetes, hypertension, hyperlipidemia, chronic thrombocytopenia, recurrent UTI, OSA not on CPAP, dysphagia with PEG tube who presents with concerns of purulent discharge from feeding tube.  S/p upsizing of g tube on 6/3.  Surgery c/s and recommended keeping g tube to gravity to ensure no pressure of phlange on skin and balloon not pulled up against intraabdominal wall.  He currently has a cortrak for post pyloric tube feeds.  Difficult disposition at this point as he's going to need cortrak for nutrition while g tube is to gravity.    See below for additional details    Assessment & Plan:  Principal Problem:   Cellulitis Active Problems:   Hypotension   Paroxysmal atrial fibrillation (HCC)   HFrEF (heart failure with reduced ejection fraction) (HCC)   C6 cervical fracture (HCC)   Controlled type 2 diabetes mellitus without complication, without long-term current use of insulin (HCC)   Delirium   S/P CABG x 3   Hypernatremia   Dysphagia   Anemia   Urinary retention   Diarrhea   Abnormal LFTs   Pressure ulcer   Goals of care, counseling/discussion   Status post insertion of percutaneous endoscopic gastrostomy (PEG) tube ([place])    Assessment and Plan: * Cellulitis Pt has dysphagia and is s/p PEG tube placement on 5/24.  Presents with malodor and purulent discharge from PEG tube site. CT abd/pelvis showed 6.4x2.2 cm area of inflammatory/infectious phlegmon in subcutaneous plaine at site of placement of gastrostomy, pockets of air in the subcutaneous plane at the site of g tube placement (due to open wound  in skin or suggest infection with gas producing organisms).  No definite demonstrable thick walled loculated fluid collection. S/p fluoroscopic guided replacement and up sizing of now 24 fr ballon retention gastrostomy tube 6/3 per Dr. Pascal Lux, suspected pressure ulcer about medial aspect of g tube insertion - likely due to insertion angle of tube and external retention disc being too tight Appreciate surgery recommedations  - g tube to gravity  - local wound care (zinc oxide to intact portion of epidermis, dressing changes around g tube 4x daily)  - post pyloric nutrition  - wound care  - available as needed, IR can follow up prn as well Issue at this time is g tube is to gravity and he has cortrak while we're allowing wound to heal.  It's not clear how long he'll need g tube to gravity and post pyloric feeding at this point.  -narrow abx, will plan short course of ancef given overall improvement (he's tolerated cephalosporins and augmentin in past per pharmacy) -Wound cultures are pending (moderate staph epi, moderate candida albicans - not sure how clinically relevant these are - will follow) -blood cultures pending -Wound care consulted, appreciate recs - medi honey added -Yesterday noted per RN and daughter that wound had looked worse and crossed, came and assessed the patient and IV antibiotics broadened back to vancomycin and Merrem. -Patient reassessed by general surgery again today 01/30/2022 for wound from a pressure necrosis from his G-tube, recommending continued current wound care, de-escalation of antibiotics, and if wound  is not shrinking over the next 1 to 2 weeks other considerations will need to be entertained at that time.  General surgery recommending continuing current wound care, postpyloric feeds. -Narrow IV antibiotics back to Ancef to complete a 10-day course of antibiotic treatment. -Appreciate general surgery's input and recommendations.  Hypotension Presented with  hypotension with SBP of 90 over 50s.  This has improved with fluid but remains soft.  Previously on Lasix, Entresto, Aldactone and beta-blockers at home but these were hold during last admission.  We will continue to hold now due to soft blood pressure -Saline lock IV fluids.  -repeat echo -> EF 40-45% - BP overall improved, follow  C6 cervical fracture ([place]) This is from a ground-level fall back in April.  Was on a soft cervical collar and continue follow-up with neurosurgery. Discussed with Dr. Ellene Route, he notes ok to d/c c collar Flex ex films  with known subacute fx through anterior inferior aspect of c 6 vertebral body, anterior osteophyte not well seen, C6-7 space normally aligned without abnormal motion on extension, not well assessed on flexion (see report) -Patient seen by neurosurgery who states C6 fracture is healed and patient can be removed from collar and pursue activities as tolerated.  HFrEF (heart failure with reduced ejection fraction) ([place]) Appears euvolemic on exam.  Continue to monitor fluid status with IV continuous fluid. Echo as above, -Saline lock IV fluids  Paroxysmal atrial fibrillation (HCC) -Currently rate controlled.   -Continue amiodarone. -Eliquis for anticoagulation.  Delirium Delirium precautions  Controlled type 2 diabetes mellitus without complication, without long-term current use of insulin (HCC) Controlled.  Last A1c 5.2% on 5/15. -CBG 121 this morning -Follow. -Hold off on SSI.  Hypernatremia Likely secondary to dehydration.   -Improved with hydration.   -Saline lock IV fluids.   S/P CABG x 3 Stable and asymptomatic from cardiac standpoint.  Anemia Trend Anemia labs c/w aocd, iron def -Hemoglobin stable at 8.3. -Transfusion threshold hemoglobin < 7.    Dysphagia S/p PEG tube by VIR on 5/24, s/p replacement as above -Dietitian consulted for tube feeding  NPO for now with g tube to gravity, consider small amounts Po for  comfort.  Urinary retention Foley catheter placed on 5/27 due to urinary retention at recent admission.  He has planned outpatient follow-up with urology. -Patient underwent successful voiding trial.  -Urine output not properly recorded over the past 24 hours. -Continue doxazosin. -Monitor urine output.  Diarrhea Daughter reported persistent diarrhea since the start of tube feed on 5/25.  Negative GI pathogen panel Negative c diff Follow, adjusting feeds per dietitian   Abnormal LFTs Mildly elevated -LFTs trending down. -Follow-up.  Pressure ulcer Pressure Injury 01/24/22 Buttocks Deep Tissue Pressure Injury - Purple or maroon localized area of discolored intact skin or blood-filled blister due to damage of underlying soft tissue from pressure and/or shear. (Active)  01/24/22 2036  Location: Buttocks  Location Orientation:   Staging: Deep Tissue Pressure Injury - Purple or maroon localized area of discolored intact skin or blood-filled blister due to damage of underlying soft tissue from pressure and/or shear.  Wound Description (Comments):   Present on Admission: Yes   Wound c/s   Goals of care, counseling/discussion Not currently interested in palliative care discussions, noted we can review pending course         DVT prophylaxis: Eliquis Code Status: Full. Family Communication: Updated daughter on phone. Disposition: CIR versus SNF  Status is: Inpatient Remains inpatient appropriate because: Severity of illness  Consultants:  Neurosurgery: Dr. Ellene Route 01/28/2022 General surgery: Dr. Kieth Brightly 01/26/2022 Wound care RN   Procedures:  CT abdomen and pelvis 01/24/2022 Plain films of the cervical spine 01/27/2022 Fluoroscopic guided replacement of gastrostomy tube with upsizing 01/25/2022 interventional radiology 2D echo 01/27/2022    Antimicrobials:  Diflucan 01/25/2022>>>> 02/02/2022 IV Ancef 01/27/2022>>>>> 02/02/2022 IV Rocephin 01/27/2022 x 1 dose IV Merrem  01/24/2022>>>> 01/27/2022 IV vancomycin 01/24/2022>>>>> 01/27/2022    Subjective: Sleeping but arousable.  No chest pain.  No shortness of breath.  Denies any abdominal pain.  No family at bedside.  Events overnight noted.    Objective: Vitals:   01/30/22 0220 01/30/22 0231 01/30/22 0541 01/30/22 0954  BP: (!) 121/57  (!) 109/59 (!) 110/55  Pulse: 61  66 76  Resp: 18   18  Temp:  (!) 97.5 F (36.4 C) 97.6 F (36.4 C) (!) 97.5 F (36.4 C)  TempSrc:  Oral Oral Axillary  SpO2: 99%  99% 97%  Weight:      Height:        Intake/Output Summary (Last 24 hours) at 01/30/2022 1554 Last data filed at 01/29/2022 1806 Gross per 24 hour  Intake --  Output 300 ml  Net -300 ml   Filed Weights   01/24/22 2324  Weight: 86.3 kg    Examination:  General exam: NAD.  Dry mucous membranes.  Cortrack.  Respiratory system: CTA B.  No wheezes, no crackles, no rhonchi.  Normal respiratory effort.   Cardiovascular system: Regular rate rhythm no murmurs rubs or gallops.  No JVD.  No lower extremity edema. Gastrointestinal system: Abdomen is nondistended, soft and nontender. No organomegaly or masses felt. Normal bowel sounds heard.  G-tube noted with some purulent discharge and to gravity. Central nervous system: Alert and oriented. No focal neurological deficits. Extremities: Symmetric 5 x 5 power. Skin: No rashes, lesions or ulcers Psychiatry: Judgement and insight appear normal. Mood & affect appropriate.     Data Reviewed:   CBC: Recent Labs  Lab 01/24/22 1524 01/25/22 0106 01/26/22 0037 01/27/22 0313 01/28/22 0146 01/29/22 0243 01/30/22 0503  WBC 6.4   < > 4.1 4.5 5.9 4.7 5.8  NEUTROABS 4.8  --  2.9  --  3.3 2.8 3.2  HGB 7.7*   < > 7.2* 7.4* 7.7* 7.4* 8.3*  HCT 25.8*   < > 23.3* 23.0* 25.4* 24.5* 27.4*  MCV 102.8*   < > 100.4* 98.3 100.0 100.8* 100.4*  PLT 115*   < > 98* 103* 113* 106* 108*   < > = values in this interval not displayed.    Basic Metabolic Panel: Recent Labs  Lab  01/26/22 0037 01/27/22 0313 01/28/22 0146 01/29/22 0243 01/29/22 1635 01/30/22 0503  NA 141 143 146* 144  --  143  K 3.8 3.7 3.4* 3.3*  --  3.6  CL 109 112* 112* 108  --  108  CO2 '26 25 23 26  '$ --  27  GLUCOSE 115* 108* 127* 145*  --  135*  BUN 41* 32* 27* 31*  --  28*  CREATININE 1.06 0.99 1.05 0.93  --  0.85  CALCIUM 9.3 9.2 9.3 8.8*  --  8.5*  MG 1.9  --  2.1 2.1 2.0 2.1  PHOS 2.3*  --  2.3* 2.4* 2.5 2.8    GFR: Estimated Creatinine Clearance: 66.4 mL/min (by C-G formula based on SCr of 0.85 mg/dL).  Liver Function Tests: Recent Labs  Lab 01/26/22 0037 01/27/22 3785 01/28/22 0433 01/29/22 0243 01/30/22  0503  AST 113* 83* 77* 51* 39  ALT 107* 92* 70* 31 14  ALKPHOS 127* 112 119 111 105  BILITOT 0.4 0.8 0.6 0.5 0.7  PROT 4.4* 4.2* 4.7* 4.2* 4.4*  ALBUMIN 1.6* 1.6* 1.6* 1.7* 1.7*    CBG: Recent Labs  Lab 01/29/22 1129 01/29/22 1654 01/30/22 0051 01/30/22 0539 01/30/22 1138  GLUCAP 145* 117* 122* 121* 138*     Recent Results (from the past 240 hour(s))  Urine Culture     Status: Abnormal   Collection Time: 01/24/22  3:12 PM   Specimen: Urine, Clean Catch  Result Value Ref Range Status   Specimen Description URINE, CLEAN CATCH  Final   Special Requests   Final    NONE Performed at [facility], [place] [address], [place], [place] [address]    Culture (A)  Final    40,000 COLONIES/mL ENTEROCOCCUS FAECIUM VANCOMYCIN RESISTANT ENTEROCOCCUS ISOLATED    Report Status 01/27/2022 FINAL  Final   Organism ID, Bacteria ENTEROCOCCUS FAECIUM (A)  Final      Susceptibility   Enterococcus faecium - MIC*    AMPICILLIN >=32 RESISTANT Resistant     NITROFURANTOIN 64 INTERMEDIATE Intermediate     VANCOMYCIN >=32 RESISTANT Resistant     LINEZOLID 2 SENSITIVE Sensitive     * 40,000 COLONIES/mL ENTEROCOCCUS FAECIUM  Blood culture (routine x 2)     Status: None   Collection Time: 01/24/22  3:24 PM   Specimen: BLOOD LEFT HAND  Result Value Ref Range Status    Specimen Description BLOOD LEFT HAND  Final   Special Requests   Final    BOTTLES DRAWN AEROBIC AND ANAEROBIC Blood Culture adequate volume   Culture   Final    NO GROWTH 5 DAYS Performed at [facility], [place] [address] [place], [place] [address]    Report Status 01/29/2022 FINAL  Final  Blood culture (routine x 2)     Status: None   Collection Time: 01/24/22  3:24 PM   Specimen: BLOOD RIGHT HAND  Result Value Ref Range Status   Specimen Description BLOOD RIGHT HAND  Final   Special Requests   Final    BOTTLES DRAWN AEROBIC AND ANAEROBIC Blood Culture adequate volume   Culture   Final    NO GROWTH 5 DAYS Performed at [facility], [place] [address] [place], [place] [address]    Report Status 01/29/2022 FINAL  Final  Gastrointestinal Panel by PCR , Stool     Status: None   Collection Time: 01/24/22  9:08 PM   Specimen: Peg Site; Stool  Result Value Ref Range Status   Campylobacter species NOT DETECTED NOT DETECTED Final   Plesimonas shigelloides NOT DETECTED NOT DETECTED Final   Salmonella species NOT DETECTED NOT DETECTED Final   Yersinia enterocolitica NOT DETECTED NOT DETECTED Final   Vibrio species NOT DETECTED NOT DETECTED Final   Vibrio cholerae NOT DETECTED NOT DETECTED Final   Enteroaggregative E coli (EAEC) NOT DETECTED NOT DETECTED Final   Enteropathogenic E coli (EPEC) NOT DETECTED NOT DETECTED Final   Enterotoxigenic E coli (ETEC) NOT DETECTED NOT DETECTED Final   Shiga like toxin producing E coli (STEC) NOT DETECTED NOT DETECTED Final   Shigella/Enteroinvasive E coli (EIEC) NOT DETECTED NOT DETECTED Final   Cryptosporidium NOT DETECTED NOT DETECTED Final   Cyclospora cayetanensis NOT DETECTED NOT DETECTED Final   Entamoeba histolytica NOT DETECTED NOT DETECTED Final   Giardia lamblia NOT DETECTED NOT DETECTED  Final   Adenovirus F40/41 NOT DETECTED NOT DETECTED Final   Astrovirus NOT DETECTED NOT DETECTED Final   Norovirus GI/GII NOT DETECTED NOT  DETECTED Final   Rotavirus A NOT DETECTED NOT DETECTED Final   Sapovirus (I, II, IV, and V) NOT DETECTED NOT DETECTED Final    Comment: Performed at [facility], [place], [place] [address]  C Difficile Quick Screen w PCR reflex     Status: None   Collection Time: 01/24/22  9:08 PM   Specimen: Peg Site; Stool  Result Value Ref Range Status   C Diff antigen NEGATIVE NEGATIVE Final   C Diff toxin NEGATIVE NEGATIVE Final   C Diff interpretation No C. difficile detected.  Final    Comment: Performed at [facility], [place] [address], [place], [place] [address]  Aerobic Culture w Gram Stain (superficial specimen)     Status: None   Collection Time: 01/24/22 10:07 PM   Specimen: Wound  Result Value Ref Range Status   Specimen Description WOUND  Final   Special Requests PEG SITE  Final   Gram Stain   Final    NO SQUAMOUS EPITHELIAL CELLS SEEN FEW WBC SEEN FEW GRAM POSITIVE COCCI ABUNDANT GRAM NEGATIVE RODS Performed at [facility], [address] [place], [place] [address]    Culture   Final    MODERATE STAPHYLOCOCCUS EPIDERMIDIS MODERATE CANDIDA ALBICANS    Report Status 01/27/2022 FINAL  Final   Organism ID, Bacteria STAPHYLOCOCCUS EPIDERMIDIS  Final      Susceptibility   Staphylococcus epidermidis - MIC*    CIPROFLOXACIN >=8 RESISTANT Resistant     ERYTHROMYCIN <=0.25 SENSITIVE Sensitive     GENTAMICIN <=0.5 SENSITIVE Sensitive     OXACILLIN RESISTANT Resistant     TETRACYCLINE <=1 SENSITIVE Sensitive     VANCOMYCIN 2 SENSITIVE Sensitive     TRIMETH/SULFA 20 SENSITIVE Sensitive     CLINDAMYCIN >=8 RESISTANT Resistant     RIFAMPIN <=0.5 SENSITIVE Sensitive     Inducible Clindamycin NEGATIVE Sensitive     * MODERATE STAPHYLOCOCCUS EPIDERMIDIS         Radiology Studies: No results found.      Scheduled Meds:  acetaminophen  1,000 mg Per Tube Q8H   amiodarone  200 mg Per Tube Daily   apixaban  5 mg Per Tube BID   Chlorhexidine  Gluconate Cloth  6 each Topical Q0600   doxazosin  1 mg Per Tube Daily   famotidine  20 mg Per Tube BID   feeding supplement (KATE FARMS STANDARD 1.4)  325 mL Per Tube Q8H   [START ON 01/31/2022] fluconazole  200 mg Per Tube Daily   fluticasone furoate-vilanterol  1 puff Inhalation Daily   free water  120 mL Per Tube Q4H   leptospermum manuka honey  1 application  Topical BID   mirtazapine  7.5 mg Per Tube QHS   nutrition supplement (JUVEN)  1 packet Per Tube BID BM   venlafaxine  18.75 mg Per Tube BID WC   Zinc Oxide   Topical BID   Continuous Infusions:   ceFAZolin (ANCEF) IV       LOS: 6 days    Time spent: 35 minutes    Irine Seal, MD [facility]   To contact the attending provider between 7A-7P or the covering provider during after hours 7P-7A, please log into the web site [email or web address] and access using universal [place] password for that web site. If  you do not have the password, please call the hospital operator.  01/30/2022, 3:54 PM

## 2022-01-30 NOTE — Consult Note (Signed)
[facility] Nurse Consult Note: Reason for Consult: gtube wound; ? pouching Wound type: pressure vs peristomal ICD (irritant contact dermatitis) from gastric contents, erosion from unstable G tub Pressure Injury POA: Yes Daughter at bedside brings to my attention patient has DTPI on the right buttock, orders are in the computer for this from previous [place] consultation 01/27/22 Measurement: Gtube wound: 2.5cm x 3.5cm x 0.5cm  DTPI; see nursing flow sheets Wound bed: Gtube; full thickness pink wound bed with 50% fibrinous tissue, some green tissue that surrounds the Gtube itself, feel this stained from gastric contents, not necrotic Right buttock; 100% dark purple, non blanchable  Drainage (amount, consistency, odor)  Clear light green gastric drainage with palpation, from peritube area.  CCS PA Michael at bedside assessing site and drainage with WOC nurse Periwound: denudation circumferentially from exposure to gastric fluids  Dressing procedure/placement/frequency: . Apply zinc oxide to the periwound skin of Gtube wound  2. Cut to fit silver hydrofiber (Aquacel Ag+) Kellie Simmering # [id number], please have unit secretary order 4-5 pieces for use. Place in open wound at [place] site (entire wound)  3. Use (2) silicone foam cut like split gauze, and placed 1 in each direction to stabilize tube and to absorb drainage  4. Change every other day unless strike through on foam. If strike through occurs change daily.  Continue xeroform and foam to the DTPI right buttock change every 2 days and PRN soilage.   Discussed POC with patient and bedside nurse.  Re consult if needed, will not follow at this time. Thanks  Geroldine Esquivias [employer], RN,CWOCN, CNS, [place] [phone])

## 2022-01-30 NOTE — Progress Notes (Addendum)
[facility] Floor coverage note:  Wound looking worse with increased area of ulceration per daughter even since 3pm.  Does have increased evaluation and looks worse than prior pictures with new area of ulceration on my exam.  Increased surrounding erythema.  1) ABx switched back to merrem + vanc 2) RN to call general surgery who have been following wound.  Discussion: Pt with lack of significant pain at wound site (RN can swab it with gauze and he doesn't react), though he clearly feels pain (from hip, when moved about bed).  Clinically I think necrotizing fasciitis seems less likely, more likely the 6.x cm area seen on CT scan has just opened up / ulcerated.  Regardless, RN giving general surgery a call to get their opinion on the matter since they are following the wound.

## 2022-01-30 NOTE — Progress Notes (Signed)
Pharmacy Antibiotic Note  [name] is a 88 y.o. male admitted on 01/24/2022 with feeding tube issue, now w/ worsening erythema around G-tube.  Pharmacy has been consulted for vancomycin and meropenem dosing.  Pt had been narrowed to Ancef on 6/6.  Plan: Vancomycin '1500mg'$  Q24H. Goal AUC 400-550.  Expected AUC 440. Meropenem 1g IV Q8H.  Height: '5\' 11"'$  (180.3 cm) Weight: 86.3 kg (190 lb 4.1 oz) IBW/kg (Calculated) : 75.3  Temp (24hrs), Avg:97.8 F (36.6 C), Min:97.5 F (36.4 C), Max:98.2 F (36.8 C)  Recent Labs  Lab 01/24/22 1524 01/24/22 1830 01/24/22 2207 01/25/22 0106 01/26/22 0037 01/27/22 0313 01/28/22 0146 01/29/22 0243  WBC 6.4  --   --  6.2 4.1 4.5 5.9 4.7  CREATININE 1.09  --   --  1.09 1.06 0.99 1.05 0.93  LATICACIDVEN 2.4* 2.5* 2.9* 2.3*  --   --   --   --     Estimated Creatinine Clearance: 60.7 mL/min (by C-G formula based on SCr of 0.93 mg/dL).    Allergies  Allergen Reactions   Penicillins Other (See Comments)    Unknown reaction -- Tolerated Augmentin courses 2019, 2023; Tolerates cephalosporins    Tramadol Other (See Comments)    Dizzy   Ketorolac Tromethamine Rash    Thank you for allowing pharmacy to be a part of this patient's care.  Wynona Neat, PharmD, BCPS  01/30/2022 4:45 AM

## 2022-01-30 NOTE — Progress Notes (Addendum)
Subjective: CC: Called back for increased drainage around the g-tube yesterday. Patient's daughter at bedside. Afebrile. WBC wnl.   Objective: Vital signs in last 24 hours: Temp:  [97.5 F (36.4 C)-97.7 F (36.5 C)] 97.6 F (36.4 C) (06/08 0541) Pulse Rate:  [55-71] 66 (06/08 0541) Resp:  [18] 18 (06/08 0220) BP: (109-121)/(46-59) 109/59 (06/08 0541) SpO2:  [94 %-100 %] 99 % (06/08 0541) Last BM Date : 01/29/22  Intake/Output from previous day: 06/07 0701 - 06/08 0700 In: -  Out: 1250 [Urine:300; Drains:950] Intake/Output this shift: No intake/output data recorded.  PE: Gen:  Alert, but seems a bit confused this am Abd: Soft, ND, NT. No rigidity or guarding. G-tube present with medial full thickness wound as seen in picture below. There is granulation tissue at the base on the medial aspect with some necrotic fibrinous tissue around the tube. There was some clear/bilious drainage noted on the dressing. No purulence. No fluctuance. Some reactive appearing erythema as seen below without heat. The medial aspect of the erythema that was seen on Monday is resolved today. G-tube to gravity. TF's running through cortrak. There does not appear to be TF like liquid draining around Gtube currently. WOCN at beside and replacing dressing.      Lab Results:  Recent Labs    01/29/22 0243 01/30/22 0503  WBC 4.7 5.8  HGB 7.4* 8.3*  HCT 24.5* 27.4*  PLT 106* 108*   BMET Recent Labs    01/29/22 0243 01/30/22 0503  NA 144 143  K 3.3* 3.6  CL 108 108  CO2 26 27  GLUCOSE 145* 135*  BUN 31* 28*  CREATININE 0.93 0.85  CALCIUM 8.8* 8.5*   PT/INR No results for input(s): "LABPROT", "INR" in the last 72 hours. CMP     Component Value Date/Time   NA 143 01/30/2022 0503   NA 142 10/15/2021 0927   K 3.6 01/30/2022 0503   CL 108 01/30/2022 0503   CO2 27 01/30/2022 0503   GLUCOSE 135 (H) 01/30/2022 0503   BUN 28 (H) 01/30/2022 0503   BUN 24 10/15/2021 0927   CREATININE  0.85 01/30/2022 0503   CREATININE 0.92 03/10/2013 0810   CALCIUM 8.5 (L) 01/30/2022 0503   PROT 4.4 (L) 01/30/2022 0503   PROT 6.1 07/01/2021 1523   ALBUMIN 1.7 (L) 01/30/2022 0503   ALBUMIN 3.8 10/15/2021 0927   AST 39 01/30/2022 0503   ALT 14 01/30/2022 0503   ALKPHOS 105 01/30/2022 0503   BILITOT 0.7 01/30/2022 0503   BILITOT 0.9 07/01/2021 1523   GFRNONAA >60 01/30/2022 0503   GFRNONAA 79 03/10/2013 0810   GFRAA 58 (L) 10/05/2020 0950   GFRAA >89 03/10/2013 0810   Lipase     Component Value Date/Time   LIPASE 70 (H) 01/24/2022 1524    Studies/Results: No results found.  Anti-infectives: Anti-infectives (From admission, onward)    Start     Dose/Rate Route Frequency Ordered Stop   01/30/22 0200  vancomycin (VANCOREADY) IVPB 1500 mg/300 mL        1,500 mg 150 mL/hr over 120 Minutes Intravenous  Once 01/30/22 0358     01/30/22 0200  meropenem (MERREM) 1 g in sodium chloride 0.9 % 100 mL IVPB        1 g 200 mL/hr over 30 Minutes Intravenous  Once 01/30/22 0358     01/27/22 1500  ceFAZolin (ANCEF) IVPB 2g/100 mL premix  Status:  Discontinued  2 g 200 mL/hr over 30 Minutes Intravenous Every 8 hours 01/27/22 1411 01/30/22 0357   01/27/22 1400  cefTRIAXone (ROCEPHIN) 2 g in sodium chloride 0.9 % 100 mL IVPB  Status:  Discontinued        2 g 200 mL/hr over 30 Minutes Intravenous Every 24 hours 01/27/22 1002 01/27/22 1347   01/27/22 1000  fluconazole (DIFLUCAN) tablet 100 mg        100 mg Per Tube Daily 01/26/22 0353 02/02/22 0959   01/26/22 1000  fluconazole (DIFLUCAN) 40 MG/ML suspension 100 mg  Status:  Discontinued       See Hyperspace for full Linked Orders Report.   100 mg Per Tube Daily 01/25/22 1833 01/26/22 0353   01/26/22 0500  fluconazole (DIFLUCAN) IVPB 200 mg        200 mg 100 mL/hr over 60 Minutes Intravenous  Once 01/26/22 0353 01/26/22 0533   01/25/22 1930  fluconazole (DIFLUCAN) 40 MG/ML suspension 200 mg  Status:  Discontinued       See Hyperspace  for full Linked Orders Report.   200 mg Per Tube  Once 01/25/22 1833 01/26/22 0353   01/25/22 1800  vancomycin (VANCOCIN) IVPB 1000 mg/200 mL premix  Status:  Discontinued        1,000 mg 200 mL/hr over 60 Minutes Intravenous Every 24 hours 01/24/22 2041 01/27/22 1347   01/24/22 2230  meropenem (MERREM) 1 g in sodium chloride 0.9 % 100 mL IVPB  Status:  Discontinued        1 g 200 mL/hr over 30 Minutes Intravenous Every 8 hours 01/24/22 2134 01/27/22 1002   01/24/22 1800  vancomycin (VANCOCIN) IVPB 1000 mg/200 mL premix        1,000 mg 200 mL/hr over 60 Minutes Intravenous  Once 01/24/22 1757 01/24/22 1952        Assessment/Plan Pressure Wound Related to G-tube placed by IR - S/p IR upsize g-tube 6/3 - Would keep G-tube to gravity to ensure no pressure of the phlange on the skin. PEG tube confirmed location on 6/4 without extrav - Cortrak placed and post pyloric TFs initiated. Xray 6/5 confirmed location. Keep G-tube to gravity.  - Can de-escalate abx from our standpoint - WOCN saw with me. They are giving recommendations for wound care with silver at the base of the wound.  - Unfortunately, not much else to offer surgically.  This will likely be a long-term problem for the patient with minimal solutions, except time.  We are available as needed.  IR can follow up on the patient as needed as well.    LOS: 6 days    Jillyn Ledger , [facility] Surgery 01/30/2022, 9:27 AM Please see Amion for pager number during day hours 7:00am-4:30pm

## 2022-01-31 ENCOUNTER — Inpatient Hospital Stay (HOSPITAL_COMMUNITY): Payer: Medicare Other

## 2022-01-31 DIAGNOSIS — L03311 Cellulitis of abdominal wall: Secondary | ICD-10-CM | POA: Diagnosis not present

## 2022-01-31 DIAGNOSIS — L03818 Cellulitis of other sites: Secondary | ICD-10-CM | POA: Diagnosis not present

## 2022-01-31 DIAGNOSIS — D649 Anemia, unspecified: Secondary | ICD-10-CM | POA: Diagnosis not present

## 2022-01-31 DIAGNOSIS — R7989 Other specified abnormal findings of blood chemistry: Secondary | ICD-10-CM | POA: Diagnosis not present

## 2022-01-31 LAB — RENAL FUNCTION PANEL
Albumin: 1.6 g/dL — ABNORMAL LOW (ref 3.5–5.0)
Anion gap: 7 (ref 5–15)
BUN: 27 mg/dL — ABNORMAL HIGH (ref 8–23)
CO2: 27 mmol/L (ref 22–32)
Calcium: 8.1 mg/dL — ABNORMAL LOW (ref 8.9–10.3)
Chloride: 105 mmol/L (ref 98–111)
Creatinine, Ser: 0.73 mg/dL (ref 0.61–1.24)
GFR, Estimated: >60 mL/min (ref >60)
Glucose, Bld: 141 mg/dL — ABNORMAL HIGH (ref 70–99)
Phosphorus: 2.7 mg/dL (ref 2.5–4.6)
Potassium: 4.1 mmol/L (ref 3.5–5.1)
Sodium: 139 mmol/L (ref 135–145)

## 2022-01-31 LAB — CBC
HCT: 25.8 % — ABNORMAL LOW (ref 39.0–52.0)
Hemoglobin: 8.1 g/dL — ABNORMAL LOW (ref 13.0–17.0)
MCH: 31.6 pg (ref 26.0–34.0)
MCHC: 31.4 g/dL (ref 30.0–36.0)
MCV: 100.8 fL — ABNORMAL HIGH (ref 80.0–100.0)
Platelets: 105 10*3/uL — ABNORMAL LOW (ref 150–400)
RBC: 2.56 MIL/uL — ABNORMAL LOW (ref 4.22–5.81)
RDW: 18.9 % — ABNORMAL HIGH (ref 11.5–15.5)
WBC: 7.1 10*3/uL (ref 4.0–10.5)
nRBC: 0 % (ref 0.0–0.2)

## 2022-01-31 LAB — GLUCOSE, CAPILLARY
Glucose-Capillary: 107 mg/dL — ABNORMAL HIGH (ref 70–99)
Glucose-Capillary: 126 mg/dL — ABNORMAL HIGH (ref 70–99)
Glucose-Capillary: 143 mg/dL — ABNORMAL HIGH (ref 70–99)
Glucose-Capillary: 151 mg/dL — ABNORMAL HIGH (ref 70–99)
Glucose-Capillary: 80 mg/dL (ref 70–99)

## 2022-01-31 LAB — MAGNESIUM: Magnesium: 2.2 mg/dL (ref 1.7–2.4)

## 2022-01-31 MED ORDER — DEXTROSE-NACL 5-0.9 % IV SOLN
INTRAVENOUS | Status: AC
Start: 2022-01-31 — End: 2022-02-01

## 2022-01-31 NOTE — Progress Notes (Signed)
PROGRESS NOTE    [name]  ALP:[id number] DOB: 12/21/1932 DOA: 01/24/2022 PCP: [name], DO    Chief Complaint  Patient presents with   feeding tube issue     Brief Narrative:  [name] is a 88 y.o. male with medical history significant of paroxysmal atrial fibrillation, chronic systolic CHF, CAD s/p CABG, sick sinus syndrome s/p pacemaker, COPD, type 2 diabetes, hypertension, hyperlipidemia, chronic thrombocytopenia, recurrent UTI, OSA not on CPAP, dysphagia with PEG tube who presents with concerns of purulent discharge from feeding tube.  S/p upsizing of g tube on 6/3.  Surgery c/s and recommended keeping g tube to gravity to ensure no pressure of phlange on skin and balloon not pulled up against intraabdominal wall.  [name] currently has a cortrak for post pyloric tube feeds.  Difficult disposition at this point as [name]'s going to need cortrak for nutrition while g tube is to gravity.    See below for additional details    Assessment & Plan:  Principal Problem:   Cellulitis Active Problems:   Hypotension   Paroxysmal atrial fibrillation (HCC)   HFrEF (heart failure with reduced ejection fraction) (HCC)   C6 cervical fracture (HCC)   Controlled type 2 diabetes mellitus without complication, without long-term current use of insulin (HCC)   Delirium   S/P CABG x 3   Hypernatremia   Dysphagia   Anemia   Urinary retention   Diarrhea   Abnormal LFTs   Pressure ulcer   Goals of care, counseling/discussion   Status post insertion of percutaneous endoscopic gastrostomy (PEG) tube ([place])    Assessment and Plan: * Cellulitis Pt has dysphagia and is s/p PEG tube placement on 5/24.  Presents with malodor and purulent discharge from PEG tube site. CT abd/pelvis showed 6.4x2.2 cm area of inflammatory/infectious phlegmon in subcutaneous plaine at site of placement of gastrostomy, pockets of air in the subcutaneous plane at the site of g tube placement (due to open wound  in skin or suggest infection with gas producing organisms).  No definite demonstrable thick walled loculated fluid collection. S/p fluoroscopic guided replacement and up sizing of now 24 fr ballon retention gastrostomy tube 6/3 per Dr. Pascal Lux, suspected pressure ulcer about medial aspect of g tube insertion - likely due to insertion angle of tube and external retention disc being too tight Appreciate surgery recommedations  - g tube to gravity  - local wound care (zinc oxide to intact portion of epidermis, dressing changes around g tube 4x daily)  - post pyloric nutrition  - wound care  - available as needed, IR can follow up prn as well Issue at this time is g tube is to gravity and [name] has cortrak while we're allowing wound to heal.  It's not clear how long [name]'ll need g tube to gravity and post pyloric feeding at this point.  -narrow abx, will plan short course of ancef given overall improvement ([name]'s tolerated cephalosporins and augmentin in past per pharmacy) -Wound cultures are pending (moderate staph epi, moderate candida albicans - not sure how clinically relevant these are - will follow) -blood cultures negative. -Wound care consulted, appreciate recs - medi honey added -On 01/29/2022 in the evening it was noted per RN and daughter that wound had looked worse and crossed, came and assessed the patient and IV antibiotics broadened back to vancomycin and Merrem. -Patient reassessed by general surgery again 01/30/2022 and stating wound from a pressure necrosis from his G-tube, recommending continued current wound care,  de-escalation of antibiotics, and if wound is not shrinking over the next 1 to 2 weeks other considerations will need to be entertained at that time.  General surgery recommending continuing current wound care, postpyloric feeds. - IV antibiotics have been narrowed back to Ancef to complete a 10-day course of antibiotic treatment. -Appreciate general surgery's input and  recommendations.  Hypotension Presented with hypotension with SBP of 90 over 50s.  This has improved with fluid but remains soft.  Previously on Lasix, Entresto, Aldactone and beta-blockers at home but these were hold during last admission.  We will continue to hold now due to soft blood pressure -Saline lock IV fluids.  -repeat echo -> EF 40-45% - BP overall improved, follow  C6 cervical fracture ([place]) This is from a ground-level fall back in April.  Was on a soft cervical collar and continue follow-up with neurosurgery. Discussed with Dr. Ellene Route, [name] notes ok to d/c c collar Flex ex films  with known subacute fx through anterior inferior aspect of c 6 vertebral body, anterior osteophyte not well seen, C6-7 space normally aligned without abnormal motion on extension, not well assessed on flexion (see report) -Patient seen by neurosurgery who states C6 fracture is healed and patient can be removed from collar and pursue activities as tolerated.  HFrEF (heart failure with reduced ejection fraction) (HCC) Appears euvolemic on exam. -IV fluids saline lock.  Echo as above,  Paroxysmal atrial fibrillation (HCC) -Currently rate controlled.   -Continue amiodarone. -Eliquis for anticoagulation.  Delirium Delirium precautions  Controlled type 2 diabetes mellitus without complication, without long-term current use of insulin (HCC) Controlled.  Last A1c 5.2% on 5/15. -CBG 151 this morning -Follow.  Hypernatremia Likely secondary to dehydration.   -Improved with hydration.   -Saline lock IV fluids.   S/P CABG x 3 Stable and asymptomatic from cardiac standpoint.  Anemia Trend Anemia labs c/w aocd, iron def -Hemoglobin stable at 8.1. -Transfusion threshold hemoglobin < 7.    Dysphagia S/p PEG tube by VIR on 5/24, s/p replacement as above -Dietitian consulted for tube feeding  NPO for now with g tube to gravity, consider small amounts Po for comfort. -This afternoon concerned that  tube feeds coming out of PEG tube site and concern for malposition of cortrack. -Abdominal films obtained and feeding tube tip noted at the position of the pylorus directed towards the duodenum. -Advance cortrack 4 cm and repeat abdominal films in about 2 to 3 hours for placement. -Hold tube feeds for now.  Urinary retention Foley catheter placed on 5/27 due to urinary retention at recent admission.  [name] has planned outpatient follow-up with urology. -Patient underwent successful voiding trial.  -Urine output 1.6 L over the past 24 hours. -Continue doxazosin. -Monitor urine output.  Diarrhea Daughter reported persistent diarrhea since the start of tube feed on 5/25.  Negative GI pathogen panel Negative c diff Follow, adjusting feeds per dietitian   Abnormal LFTs Mildly elevated -LFTs trending down. -Follow-up.  Pressure ulcer Pressure Injury 01/24/22 Buttocks Deep Tissue Pressure Injury - Purple or maroon localized area of discolored intact skin or blood-filled blister due to damage of underlying soft tissue from pressure and/or shear. (Active)  01/24/22 2036  Location: Buttocks  Location Orientation:   Staging: Deep Tissue Pressure Injury - Purple or maroon localized area of discolored intact skin or blood-filled blister due to damage of underlying soft tissue from pressure and/or shear.  Wound Description (Comments):   Present on Admission: Yes   Wound c/s  Goals of care, counseling/discussion Not currently interested in palliative care discussions, noted we can review pending course         DVT prophylaxis: Eliquis Code Status: Full. Family Communication: No family at bedside.  Disposition: CIR versus SNF  Status is: Inpatient Remains inpatient appropriate because: Severity of illness   Consultants:  Neurosurgery: Dr. Ellene Route 01/28/2022 General surgery: Dr. Kieth Brightly 01/26/2022 Wound care RN   Procedures:  CT abdomen and pelvis 01/24/2022 Plain films of the  cervical spine 01/27/2022 Fluoroscopic guided replacement of gastrostomy tube with upsizing 01/25/2022 interventional radiology 2D echo 01/27/2022    Antimicrobials:  Diflucan 01/25/2022>>>> 02/02/2022 IV Ancef 01/27/2022>>>>> 02/02/2022 IV Rocephin 01/27/2022 x 1 dose IV Merrem 01/24/2022>>>> 01/27/2022 IV vancomycin 01/24/2022>>>>> 01/27/2022    Subjective: Sleeping.  Arousable.  No chest pain.  No shortness of breath.  No abdominal pain.   Objective: Vitals:   01/31/22 0509 01/31/22 0749 01/31/22 0838 01/31/22 1608  BP: (!) 109/53 103/60  (!) 107/54  Pulse: 73 70  62  Resp: '20 18  18  '$ Temp: 97.7 F (36.5 C) 97.6 F (36.4 C)  97.8 F (36.6 C)  TempSrc: Oral     SpO2: 99% 100% 98% 96%  Weight:      Height:        Intake/Output Summary (Last 24 hours) at 01/31/2022 1902 Last data filed at 01/31/2022 1814 Gross per 24 hour  Intake 460 ml  Output 2250 ml  Net -1790 ml   Filed Weights   01/24/22 2324  Weight: 86.3 kg    Examination:  General exam: NAD.  Dry mucous membranes.  Cortrack.  Respiratory system: Lungs clear to auscultation bilaterally anterior lung fields.  No wheezes, no crackles, no rhonchi.  Normal respiratory effort.  Cardiovascular system: RRR no murmurs rubs or gallops.  No JVD.  No lower extremity edema.  Gastrointestinal system: Abdomen is soft, nontender, nondistended, positive bowel sounds.  G-tube noted with some purulent discharge to gravity abdomen is nondistended, soft and nontender. Central nervous system: Alert and oriented. No focal neurological deficits. Extremities: Symmetric 5 x 5 power. Skin: No rashes, lesions or ulcers Psychiatry: Judgement and insight appear normal. Mood & affect appropriate.     Data Reviewed:   CBC: Recent Labs  Lab 01/26/22 0037 01/27/22 0313 01/28/22 0146 01/29/22 0243 01/30/22 0503 01/31/22 0906  WBC 4.1 4.5 5.9 4.7 5.8 7.1  NEUTROABS 2.9  --  3.3 2.8 3.2  --   HGB 7.2* 7.4* 7.7* 7.4* 8.3* 8.1*  HCT 23.3* 23.0* 25.4*  24.5* 27.4* 25.8*  MCV 100.4* 98.3 100.0 100.8* 100.4* 100.8*  PLT 98* 103* 113* 106* 108* 105*    Basic Metabolic Panel: Recent Labs  Lab 01/27/22 0313 01/28/22 0146 01/29/22 0243 01/29/22 1635 01/30/22 0503 01/31/22 0906  NA 143 146* 144  --  143 139  K 3.7 3.4* 3.3*  --  3.6 4.1  CL 112* 112* 108  --  108 105  CO2 '25 23 26  '$ --  27 27  GLUCOSE 108* 127* 145*  --  135* 141*  BUN 32* 27* 31*  --  28* 27*  CREATININE 0.99 1.05 0.93  --  0.85 0.73  CALCIUM 9.2 9.3 8.8*  --  8.5* 8.1*  MG  --  2.1 2.1 2.0 2.1 2.2  PHOS  --  2.3* 2.4* 2.5 2.8 2.7    GFR: Estimated Creatinine Clearance: 70.6 mL/min (by C-G formula based on SCr of 0.73 mg/dL).  Liver Function Tests: Recent Labs  Lab 01/26/22 0037 01/27/22 0313 01/28/22 0433 01/29/22 0243 01/30/22 0503 01/31/22 0906  AST 113* 83* 77* 51* 39  --   ALT 107* 92* 70* 31 14  --   ALKPHOS 127* 112 119 111 105  --   BILITOT 0.4 0.8 0.6 0.5 0.7  --   PROT 4.4* 4.2* 4.7* 4.2* 4.4*  --   ALBUMIN 1.6* 1.6* 1.6* 1.7* 1.7* 1.6*    CBG: Recent Labs  Lab 01/30/22 2359 01/31/22 0616 01/31/22 1111 01/31/22 1605 01/31/22 1755  GLUCAP 143* 151* 126* 107* 80     Recent Results (from the past 240 hour(s))  Urine Culture     Status: Abnormal   Collection Time: 01/24/22  3:12 PM   Specimen: Urine, Clean Catch  Result Value Ref Range Status   Specimen Description URINE, CLEAN CATCH  Final   Special Requests   Final    NONE Performed at [facility], [place] [address], [place], [place] [address]    Culture (A)  Final    40,000 COLONIES/mL ENTEROCOCCUS FAECIUM VANCOMYCIN RESISTANT ENTEROCOCCUS ISOLATED    Report Status 01/27/2022 FINAL  Final   Organism ID, Bacteria ENTEROCOCCUS FAECIUM (A)  Final      Susceptibility   Enterococcus faecium - MIC*    AMPICILLIN >=32 RESISTANT Resistant     NITROFURANTOIN 64 INTERMEDIATE Intermediate     VANCOMYCIN >=32 RESISTANT Resistant     LINEZOLID 2 SENSITIVE Sensitive     *  40,000 COLONIES/mL ENTEROCOCCUS FAECIUM  Blood culture (routine x 2)     Status: None   Collection Time: 01/24/22  3:24 PM   Specimen: BLOOD LEFT HAND  Result Value Ref Range Status   Specimen Description BLOOD LEFT HAND  Final   Special Requests   Final    BOTTLES DRAWN AEROBIC AND ANAEROBIC Blood Culture adequate volume   Culture   Final    NO GROWTH 5 DAYS Performed at [facility], [place] [address], [place], [place] [address]    Report Status 01/29/2022 FINAL  Final  Blood culture (routine x 2)     Status: None   Collection Time: 01/24/22  3:24 PM   Specimen: BLOOD RIGHT HAND  Result Value Ref Range Status   Specimen Description BLOOD RIGHT HAND  Final   Special Requests   Final    BOTTLES DRAWN AEROBIC AND ANAEROBIC Blood Culture adequate volume   Culture   Final    NO GROWTH 5 DAYS Performed at [facility], [place] [address] [place], [place] [address]    Report Status 01/29/2022 FINAL  Final  Gastrointestinal Panel by PCR , Stool     Status: None   Collection Time: 01/24/22  9:08 PM   Specimen: Peg Site; Stool  Result Value Ref Range Status   Campylobacter species NOT DETECTED NOT DETECTED Final   Plesimonas shigelloides NOT DETECTED NOT DETECTED Final   Salmonella species NOT DETECTED NOT DETECTED Final   Yersinia enterocolitica NOT DETECTED NOT DETECTED Final   Vibrio species NOT DETECTED NOT DETECTED Final   Vibrio cholerae NOT DETECTED NOT DETECTED Final   Enteroaggregative E coli (EAEC) NOT DETECTED NOT DETECTED Final   Enteropathogenic E coli (EPEC) NOT DETECTED NOT DETECTED Final   Enterotoxigenic E coli (ETEC) NOT DETECTED NOT DETECTED Final   Shiga like toxin producing E coli (STEC) NOT DETECTED NOT DETECTED Final   Shigella/Enteroinvasive E coli (EIEC) NOT DETECTED NOT DETECTED Final   Cryptosporidium NOT DETECTED NOT  DETECTED Final   Cyclospora cayetanensis NOT DETECTED NOT DETECTED Final   Entamoeba histolytica NOT DETECTED NOT DETECTED Final    Giardia lamblia NOT DETECTED NOT DETECTED Final   Adenovirus F40/41 NOT DETECTED NOT DETECTED Final   Astrovirus NOT DETECTED NOT DETECTED Final   Norovirus GI/GII NOT DETECTED NOT DETECTED Final   Rotavirus A NOT DETECTED NOT DETECTED Final   Sapovirus (I, II, IV, and V) NOT DETECTED NOT DETECTED Final    Comment: Performed at [facility], [place], [address] [address]  C Difficile Quick Screen w PCR reflex     Status: None   Collection Time: 01/24/22  9:08 PM   Specimen: Peg Site; Stool  Result Value Ref Range Status   C Diff antigen NEGATIVE NEGATIVE Final   C Diff toxin NEGATIVE NEGATIVE Final   C Diff interpretation No C. difficile detected.  Final    Comment: Performed at [facility], [place] [address] [place], [place] [address]  Aerobic Culture w Gram Stain (superficial specimen)     Status: None   Collection Time: 01/24/22 10:07 PM   Specimen: Wound  Result Value Ref Range Status   Specimen Description WOUND  Final   Special Requests PEG SITE  Final   Gram Stain   Final    NO SQUAMOUS EPITHELIAL CELLS SEEN FEW WBC SEEN FEW GRAM POSITIVE COCCI ABUNDANT GRAM NEGATIVE RODS Performed at [facility], [address] [place], [place] [address]    Culture   Final    MODERATE STAPHYLOCOCCUS EPIDERMIDIS MODERATE CANDIDA ALBICANS    Report Status 01/27/2022 FINAL  Final   Organism ID, Bacteria STAPHYLOCOCCUS EPIDERMIDIS  Final      Susceptibility   Staphylococcus epidermidis - MIC*    CIPROFLOXACIN >=8 RESISTANT Resistant     ERYTHROMYCIN <=0.25 SENSITIVE Sensitive     GENTAMICIN <=0.5 SENSITIVE Sensitive     OXACILLIN RESISTANT Resistant     TETRACYCLINE <=1 SENSITIVE Sensitive     VANCOMYCIN 2 SENSITIVE Sensitive     TRIMETH/SULFA 20 SENSITIVE Sensitive     CLINDAMYCIN >=8 RESISTANT Resistant     RIFAMPIN <=0.5 SENSITIVE Sensitive     Inducible Clindamycin NEGATIVE Sensitive     * MODERATE STAPHYLOCOCCUS EPIDERMIDIS          Radiology Studies: DG Abd Portable 1V  Result Date: 01/31/2022 CLINICAL DATA:  NG tube placement. EXAM: PORTABLE ABDOMEN - 1 VIEW COMPARISON:  None Available. FINDINGS: No NG tube visible on the current film. The tip of a feeding catheter is positioned at the pylorus, directed towards the duodenum. Gastrostomy tube evident. Bowel gas pattern is nonspecific. IMPRESSION: Feeding tube tip is positioned at the pylorus, directed towards the duodenum. Electronically Signed   By: Misty Stanley M.D.   On: 01/31/2022 17:43        Scheduled Meds:  acetaminophen  1,000 mg Per Tube Q8H   amiodarone  200 mg Per Tube Daily   apixaban  5 mg Per Tube BID   Chlorhexidine Gluconate Cloth  6 each Topical Q0600   doxazosin  1 mg Per Tube Daily   famotidine  20 mg Per Tube BID   feeding supplement (KATE FARMS STANDARD 1.4)  325 mL Per Tube Q8H   fluconazole  200 mg Per Tube Daily   fluticasone furoate-vilanterol  1 puff Inhalation Daily   free water  120 mL Per Tube Q4H   leptospermum manuka honey  1 application  Topical BID  mirtazapine  7.5 mg Per Tube QHS   nutrition supplement (JUVEN)  1 packet Per Tube BID BM   venlafaxine  18.75 mg Per Tube BID WC   Zinc Oxide   Topical BID   Continuous Infusions:   ceFAZolin (ANCEF) IV 2 g (01/31/22 1341)   dextrose 5 % and 0.9% NaCl 50 mL/hr at 01/31/22 1814     LOS: 7 days    Time spent: 35 minutes    Irine Seal, MD [facility]   To contact the attending provider between 7A-7P or the covering provider during after hours 7P-7A, please log into the web site [email or web address] and access using universal [place] password for that web site. If you do not have the password, please call the hospital operator.  01/31/2022, 7:02 PM

## 2022-01-31 NOTE — Progress Notes (Signed)
Patients daughter brought to attention, that she was concerned about drainage coming from [place] MD Grandville Silos made aware of concerns.

## 2022-02-01 ENCOUNTER — Inpatient Hospital Stay (HOSPITAL_COMMUNITY): Payer: Medicare Other

## 2022-02-01 DIAGNOSIS — L03818 Cellulitis of other sites: Secondary | ICD-10-CM | POA: Diagnosis not present

## 2022-02-01 DIAGNOSIS — D649 Anemia, unspecified: Secondary | ICD-10-CM | POA: Diagnosis not present

## 2022-02-01 DIAGNOSIS — R7989 Other specified abnormal findings of blood chemistry: Secondary | ICD-10-CM | POA: Diagnosis not present

## 2022-02-01 DIAGNOSIS — L03311 Cellulitis of abdominal wall: Secondary | ICD-10-CM | POA: Diagnosis not present

## 2022-02-01 LAB — BASIC METABOLIC PANEL
Anion gap: 8 (ref 5–15)
BUN: 33 mg/dL — ABNORMAL HIGH (ref 8–23)
CO2: 25 mmol/L (ref 22–32)
Calcium: 8.4 mg/dL — ABNORMAL LOW (ref 8.9–10.3)
Chloride: 107 mmol/L (ref 98–111)
Creatinine, Ser: 0.76 mg/dL (ref 0.61–1.24)
GFR, Estimated: >60 mL/min (ref >60)
Glucose, Bld: 102 mg/dL — ABNORMAL HIGH (ref 70–99)
Potassium: 4.7 mmol/L (ref 3.5–5.1)
Sodium: 140 mmol/L (ref 135–145)

## 2022-02-01 LAB — CBC
HCT: 26.2 % — ABNORMAL LOW (ref 39.0–52.0)
Hemoglobin: 8.2 g/dL — ABNORMAL LOW (ref 13.0–17.0)
MCH: 31.3 pg (ref 26.0–34.0)
MCHC: 31.3 g/dL (ref 30.0–36.0)
MCV: 100 fL (ref 80.0–100.0)
Platelets: 103 10*3/uL — ABNORMAL LOW (ref 150–400)
RBC: 2.62 MIL/uL — ABNORMAL LOW (ref 4.22–5.81)
RDW: 19.2 % — ABNORMAL HIGH (ref 11.5–15.5)
WBC: 7.9 10*3/uL (ref 4.0–10.5)
nRBC: 0 % (ref 0.0–0.2)

## 2022-02-01 LAB — GLUCOSE, CAPILLARY
Glucose-Capillary: 111 mg/dL — ABNORMAL HIGH (ref 70–99)
Glucose-Capillary: 116 mg/dL — ABNORMAL HIGH (ref 70–99)
Glucose-Capillary: 86 mg/dL (ref 70–99)
Glucose-Capillary: 99 mg/dL (ref 70–99)

## 2022-02-01 MED ORDER — KATE FARMS STANDARD 1.4 PO LIQD
325.0000 mL | ORAL | Status: DC
  Administered 2022-02-01 – 2022-02-03 (×6): 325 mL
  Filled 2022-02-01 (×12): qty 325

## 2022-02-01 NOTE — Progress Notes (Addendum)
NUTRITION NOTE  Page received mid-morning from RN asking about bridle and ability to advance Cortrak with bridle in place. Able to walk her through this over the phone.  Page received ~1.5 hours later from MD sharing that abdominal x-ray indicated side port of Cortrak at the pylorus and recommendation for tube to be advanced 4 cm.   Discussed options available on the weekend vs holding TF until Monday when Cortrak team is available.   Patient with PEG to LIS so Cortrak must be post-pyloric in order to safely feed patient.  Page received from MD at 1435. Cortrak has been advanced and is now in the 3rd portion of the duodenum.   TF had been off since yesterday afternoon (~24 hours). TF previously running at goal rate: [employer] 1.4 @ 65 ml/hr. Will resume at this rate. No need to initiate at trickles and titrate up for patient who was previously tolerating goal rate.   RD will continue to follow per protocol.     Jarome Matin, MS, RD, LDN Registered Dietitian II Inpatient Clinical Nutrition RD pager # and on-call/weekend pager # available in [facility]

## 2022-02-01 NOTE — Progress Notes (Signed)
RN advance cortrak to 4 cm.

## 2022-02-01 NOTE — Progress Notes (Signed)
PROGRESS NOTE    [name]  ZDG:[id number] DOB: 07-19-1933 DOA: 01/24/2022 PCP: [name], DO    Chief Complaint  Patient presents with   feeding tube issue     Brief Narrative:  [name] is a 88 y.o. male with medical history significant of paroxysmal atrial fibrillation, chronic systolic CHF, CAD s/p CABG, sick sinus syndrome s/p pacemaker, COPD, type 2 diabetes, hypertension, hyperlipidemia, chronic thrombocytopenia, recurrent UTI, OSA not on CPAP, dysphagia with PEG tube who presents with concerns of purulent discharge from feeding tube.  S/p upsizing of g tube on 6/3.  Surgery c/s and recommended keeping g tube to gravity to ensure no pressure of phlange on skin and balloon not pulled up against intraabdominal wall.  He currently has a cortrak for post pyloric tube feeds.  Difficult disposition at this point as he's going to need cortrak for nutrition while g tube is to gravity.    See below for additional details    Assessment & Plan:  Principal Problem:   Cellulitis Active Problems:   Hypotension   Paroxysmal atrial fibrillation (HCC)   HFrEF (heart failure with reduced ejection fraction) (HCC)   C6 cervical fracture (HCC)   Controlled type 2 diabetes mellitus without complication, without long-term current use of insulin (HCC)   Delirium   S/P CABG x 3   Hypernatremia   Dysphagia   Anemia   Urinary retention   Diarrhea   Abnormal LFTs   Pressure ulcer   Goals of care, counseling/discussion   Status post insertion of percutaneous endoscopic gastrostomy (PEG) tube ([place])    Assessment and Plan: * Cellulitis Pt has dysphagia and is s/p PEG tube placement on 5/24.  Presents with malodor and purulent discharge from PEG tube site. CT abd/pelvis showed 6.4x2.2 cm area of inflammatory/infectious phlegmon in subcutaneous plaine at site of placement of gastrostomy, pockets of air in the subcutaneous plane at the site of g tube placement (due to open wound  in skin or suggest infection with gas producing organisms).  No definite demonstrable thick walled loculated fluid collection. S/p fluoroscopic guided replacement and up sizing of now 24 fr ballon retention gastrostomy tube 6/3 per Dr. Pascal Lux, suspected pressure ulcer about medial aspect of g tube insertion - likely due to insertion angle of tube and external retention disc being too tight Appreciate surgery recommedations  - g tube to gravity  - local wound care (zinc oxide to intact portion of epidermis, dressing changes around g tube 4x daily)  - post pyloric nutrition  - wound care  - available as needed, IR can follow up prn as well Issue at this time is g tube is to gravity and he has cortrak while we're allowing wound to heal.  It's not clear how long he'll need g tube to gravity and post pyloric feeding at this point.  -narrow abx, will plan short course of ancef given overall improvement (he's tolerated cephalosporins and augmentin in past per pharmacy) -Wound cultures are pending (moderate staph epi, moderate candida albicans - not sure how clinically relevant these are - will follow) -blood cultures negative. -Wound care consulted, appreciate recs - medi honey added -On 01/29/2022 in the evening it was noted per RN and daughter that wound had looked worse and crossed, came and assessed the patient and IV antibiotics broadened back to vancomycin and Merrem. -Patient reassessed by general surgery again 01/30/2022 and stating wound from a pressure necrosis from his G-tube, recommending continued current wound care,  de-escalation of antibiotics, and if wound is not shrinking over the next 1 to 2 weeks other considerations will need to be entertained at that time.  General surgery recommending continuing current wound care, postpyloric feeds. - IV antibiotics have been narrowed back to Ancef to complete a 10-day course of antibiotic treatment. -Appreciate general surgery's input and  recommendations.  Hypotension Presented with hypotension with SBP of 90 over 50s.  This has improved with fluid but remains soft.  Previously on Lasix, Entresto, Aldactone and beta-blockers at home but these were hold during last admission.   -Continue to hold hypertensive medications secondary to low blood pressure. -Saline lock IV fluids.  -repeat echo -> EF 40-45% - BP overall improved, follow  C6 cervical fracture ([place]) This is from a ground-level fall back in April.  Was on a soft cervical collar and continue follow-up with neurosurgery. Discussed with Dr. Ellene Route, he notes ok to d/c c collar Flex ex films  with known subacute fx through anterior inferior aspect of c 6 vertebral body, anterior osteophyte not well seen, C6-7 space normally aligned without abnormal motion on extension, not well assessed on flexion (see report) -Patient seen by neurosurgery who states C6 fracture is healed and patient can be removed from collar and pursue activities as tolerated.  HFrEF (heart failure with reduced ejection fraction) ([place]) Appears euvolemic on exam. Echo as above,  Paroxysmal atrial fibrillation (HCC) -Currently rate controlled.   -Continue amiodarone. -Eliquis for anticoagulation.  Delirium Delirium precautions  Controlled type 2 diabetes mellitus without complication, without long-term current use of insulin (HCC) Controlled.  Last A1c 5.2% on 5/15. -CBG 99 this morning.   -Follow.  Hypernatremia Likely secondary to dehydration.   -Improved with hydration.   -Patient placed on gentle hydration 01/31/2022 due to malposition of cortrack tube.  S/P CABG x 3 Stable and asymptomatic from cardiac standpoint.  Anemia  Anemia labs c/w aocd, iron def -Hemoglobin stable at 8.2. -Transfusion threshold hemoglobin < 7.    Dysphagia S/p PEG tube by VIR on 5/24, s/p replacement as above -Dietitian consulted for tube feeding  NPO for now with g tube to gravity, consider small  amounts Po for comfort. -The afternoon of 01/31/2022, concerned that tube feeds coming out of PEG tube site and concern for malposition of cortrack. -Abdominal films obtained and feeding tube tip noted at the position of the pylorus directed towards the duodenum. -Advanced cortrack 4 cm today 02/01/2022 with repeat abdominal films showing appropriate placement of cortrack tube.  -Resume tube feeds. -Continue gentle hydration for another 24 hours and discontinue tomorrow. -Dietitian following and appreciate their input and recommendations.  Urinary retention Foley catheter placed on 5/27 due to urinary retention at recent admission.  He has planned outpatient follow-up with urology. -Patient underwent successful voiding trial.  -Urine output 1.5 L over the past 24 hours. -Continue doxazosin. -Monitor urine output.  Diarrhea Daughter reported persistent diarrhea since the start of tube feed on 5/25.  -Tube feeds changed with some improvement with diarrhea. Negative GI pathogen panel Negative c diff  Abnormal LFTs Mildly elevated -LFTs trending down. -Follow-up.  Pressure ulcer Pressure Injury 01/24/22 Buttocks Deep Tissue Pressure Injury - Purple or maroon localized area of discolored intact skin or blood-filled blister due to damage of underlying soft tissue from pressure and/or shear. (Active)  01/24/22 2036  Location: Buttocks  Location Orientation:   Staging: Deep Tissue Pressure Injury - Purple or maroon localized area of discolored intact skin or blood-filled blister due to  damage of underlying soft tissue from pressure and/or shear.  Wound Description (Comments):   Present on Admission: Yes   Wound c/s   Goals of care, counseling/discussion Not currently interested in palliative care discussions, noted we can review pending course         DVT prophylaxis: Eliquis Code Status: Full. Family Communication: Updated patient and daughter at bedside. Disposition: CIR  versus SNF  Status is: Inpatient Remains inpatient appropriate because: Severity of illness   Consultants:  Neurosurgery: Dr. Ellene Route 01/28/2022 General surgery: Dr. Kieth Brightly 01/26/2022 Wound care RN   Procedures:  CT abdomen and pelvis 01/24/2022 Plain films of the cervical spine 01/27/2022 Fluoroscopic guided replacement of gastrostomy tube with upsizing 01/25/2022 interventional radiology 2D echo 01/27/2022    Antimicrobials:  Diflucan 01/25/2022>>>> 02/02/2022 IV Ancef 01/27/2022>>>>> 02/02/2022 IV Rocephin 01/27/2022 x 1 dose IV Merrem 01/24/2022>>>> 01/27/2022 IV vancomycin 01/24/2022>>>>> 01/27/2022    Subjective: Sitting up in bed.  Daughter at bedside getting ready to shave patient.  No chest pain.  No shortness of breath.   Objective: Vitals:   02/01/22 0604 02/01/22 0749 02/01/22 0814 02/01/22 1605  BP: 109/83 (!) 114/52  (!) 108/54  Pulse: 72 67 65 70  Resp: '18 16 16 16  '$ Temp: 97.8 F (36.6 C) 97.8 F (36.6 C)  97.7 F (36.5 C)  TempSrc: Oral Oral  Oral  SpO2: 99% 99% 95% 98%  Weight:      Height:        Intake/Output Summary (Last 24 hours) at 02/01/2022 1745 Last data filed at 02/01/2022 1716 Gross per 24 hour  Intake 797.7 ml  Output 1350 ml  Net -552.3 ml   Filed Weights   01/24/22 2324 02/01/22 0500  Weight: 86.3 kg 115.2 kg    Examination:  General exam: Alert.  NAD. Cortrack. Respiratory system: CTA B.  No wheezes, no crackles, no rhonchi.  Fair air movement.  Speaking in full sentences.  Cardiovascular system: Regular rate rhythm no murmurs rubs or gallops.  No JVD.  No lower extremity edema. Gastrointestinal system: Abdomen is soft, nontender, nondistended, positive bowel sounds.  G-tube noted with dressing intact at insertion site.  Central nervous system: Alert and oriented. No focal neurological deficits. Extremities: Symmetric 5 x 5 power. Skin: No rashes, lesions or ulcers Psychiatry: Judgement and insight appear normal. Mood & affect appropriate.      Data Reviewed:   CBC: Recent Labs  Lab 01/26/22 0037 01/27/22 0313 01/28/22 0146 01/29/22 0243 01/30/22 0503 01/31/22 0906 02/01/22 0152  WBC 4.1   < > 5.9 4.7 5.8 7.1 7.9  NEUTROABS 2.9  --  3.3 2.8 3.2  --   --   HGB 7.2*   < > 7.7* 7.4* 8.3* 8.1* 8.2*  HCT 23.3*   < > 25.4* 24.5* 27.4* 25.8* 26.2*  MCV 100.4*   < > 100.0 100.8* 100.4* 100.8* 100.0  PLT 98*   < > 113* 106* 108* 105* 103*   < > = values in this interval not displayed.    Basic Metabolic Panel: Recent Labs  Lab 01/28/22 0146 01/29/22 0243 01/29/22 1635 01/30/22 0503 01/31/22 0906 02/01/22 0152  NA 146* 144  --  143 139 140  K 3.4* 3.3*  --  3.6 4.1 4.7  CL 112* 108  --  108 105 107  CO2 23 26  --  '27 27 25  '$ GLUCOSE 127* 145*  --  135* 141* 102*  BUN 27* 31*  --  28* 27* 33*  CREATININE  1.05 0.93  --  0.85 0.73 0.76  CALCIUM 9.3 8.8*  --  8.5* 8.1* 8.4*  MG 2.1 2.1 2.0 2.1 2.2  --   PHOS 2.3* 2.4* 2.5 2.8 2.7  --     GFR: Estimated Creatinine Clearance: 85.6 mL/min (by C-G formula based on SCr of 0.76 mg/dL).  Liver Function Tests: Recent Labs  Lab 01/26/22 0037 01/27/22 0313 01/28/22 0433 01/29/22 0243 01/30/22 0503 01/31/22 0906  AST 113* 83* 77* 51* 39  --   ALT 107* 92* 70* 31 14  --   ALKPHOS 127* 112 119 111 105  --   BILITOT 0.4 0.8 0.6 0.5 0.7  --   PROT 4.4* 4.2* 4.7* 4.2* 4.4*  --   ALBUMIN 1.6* 1.6* 1.6* 1.7* 1.7* 1.6*    CBG: Recent Labs  Lab 01/31/22 1605 01/31/22 1755 02/01/22 0026 02/01/22 0601 02/01/22 1211  GLUCAP 107* 80 111* 99 86     Recent Results (from the past 240 hour(s))  Urine Culture     Status: Abnormal   Collection Time: 01/24/22  3:12 PM   Specimen: Urine, Clean Catch  Result Value Ref Range Status   Specimen Description URINE, CLEAN CATCH  Final   Special Requests   Final    NONE Performed at [facility], [place] [address] [place], [place] [address]    Culture (A)  Final    40,000 COLONIES/mL ENTEROCOCCUS  FAECIUM VANCOMYCIN RESISTANT ENTEROCOCCUS ISOLATED    Report Status 01/27/2022 FINAL  Final   Organism ID, Bacteria ENTEROCOCCUS FAECIUM (A)  Final      Susceptibility   Enterococcus faecium - MIC*    AMPICILLIN >=32 RESISTANT Resistant     NITROFURANTOIN 64 INTERMEDIATE Intermediate     VANCOMYCIN >=32 RESISTANT Resistant     LINEZOLID 2 SENSITIVE Sensitive     * 40,000 COLONIES/mL ENTEROCOCCUS FAECIUM  Blood culture (routine x 2)     Status: None   Collection Time: 01/24/22  3:24 PM   Specimen: BLOOD LEFT HAND  Result Value Ref Range Status   Specimen Description BLOOD LEFT HAND  Final   Special Requests   Final    BOTTLES DRAWN AEROBIC AND ANAEROBIC Blood Culture adequate volume   Culture   Final    NO GROWTH 5 DAYS Performed at [facility], [place] [address], [place], [place] [address]    Report Status 01/29/2022 FINAL  Final  Blood culture (routine x 2)     Status: None   Collection Time: 01/24/22  3:24 PM   Specimen: BLOOD RIGHT HAND  Result Value Ref Range Status   Specimen Description BLOOD RIGHT HAND  Final   Special Requests   Final    BOTTLES DRAWN AEROBIC AND ANAEROBIC Blood Culture adequate volume   Culture   Final    NO GROWTH 5 DAYS Performed at [facility], [place] [address], [place], [place] [address]    Report Status 01/29/2022 FINAL  Final  Gastrointestinal Panel by PCR , Stool     Status: None   Collection Time: 01/24/22  9:08 PM   Specimen: Peg Site; Stool  Result Value Ref Range Status   Campylobacter species NOT DETECTED NOT DETECTED Final   Plesimonas shigelloides NOT DETECTED NOT DETECTED Final   Salmonella species NOT DETECTED NOT DETECTED Final   Yersinia enterocolitica NOT DETECTED NOT DETECTED Final   Vibrio species NOT DETECTED NOT DETECTED Final   Vibrio cholerae NOT DETECTED NOT DETECTED Final  Enteroaggregative E coli (EAEC) NOT DETECTED NOT DETECTED Final   Enteropathogenic E coli (EPEC) NOT DETECTED NOT DETECTED Final    Enterotoxigenic E coli (ETEC) NOT DETECTED NOT DETECTED Final   Shiga like toxin producing E coli (STEC) NOT DETECTED NOT DETECTED Final   Shigella/Enteroinvasive E coli (EIEC) NOT DETECTED NOT DETECTED Final   Cryptosporidium NOT DETECTED NOT DETECTED Final   Cyclospora cayetanensis NOT DETECTED NOT DETECTED Final   Entamoeba histolytica NOT DETECTED NOT DETECTED Final   Giardia lamblia NOT DETECTED NOT DETECTED Final   Adenovirus F40/41 NOT DETECTED NOT DETECTED Final   Astrovirus NOT DETECTED NOT DETECTED Final   Norovirus GI/GII NOT DETECTED NOT DETECTED Final   Rotavirus A NOT DETECTED NOT DETECTED Final   Sapovirus (I, II, IV, and V) NOT DETECTED NOT DETECTED Final    Comment: Performed at [facility], [address] [place], [place] [address]  C Difficile Quick Screen w PCR reflex     Status: None   Collection Time: 01/24/22  9:08 PM   Specimen: Peg Site; Stool  Result Value Ref Range Status   C Diff antigen NEGATIVE NEGATIVE Final   C Diff toxin NEGATIVE NEGATIVE Final   C Diff interpretation No C. difficile detected.  Final    Comment: Performed at [facility], [place] [address], [place], [place] [address]  Aerobic Culture w Gram Stain (superficial specimen)     Status: None   Collection Time: 01/24/22 10:07 PM   Specimen: Wound  Result Value Ref Range Status   Specimen Description WOUND  Final   Special Requests PEG SITE  Final   Gram Stain   Final    NO SQUAMOUS EPITHELIAL CELLS SEEN FEW WBC SEEN FEW GRAM POSITIVE COCCI ABUNDANT GRAM NEGATIVE RODS Performed at [facility], [address] [place], [place] [address]    Culture   Final    MODERATE STAPHYLOCOCCUS EPIDERMIDIS MODERATE CANDIDA ALBICANS    Report Status 01/27/2022 FINAL  Final   Organism ID, Bacteria STAPHYLOCOCCUS EPIDERMIDIS  Final      Susceptibility   Staphylococcus epidermidis - MIC*    CIPROFLOXACIN >=8 RESISTANT Resistant     ERYTHROMYCIN <=0.25 SENSITIVE Sensitive      GENTAMICIN <=0.5 SENSITIVE Sensitive     OXACILLIN RESISTANT Resistant     TETRACYCLINE <=1 SENSITIVE Sensitive     VANCOMYCIN 2 SENSITIVE Sensitive     TRIMETH/SULFA 20 SENSITIVE Sensitive     CLINDAMYCIN >=8 RESISTANT Resistant     RIFAMPIN <=0.5 SENSITIVE Sensitive     Inducible Clindamycin NEGATIVE Sensitive     * MODERATE STAPHYLOCOCCUS EPIDERMIDIS         Radiology Studies: DG Abd 1 View  Result Date: 02/01/2022 CLINICAL DATA:  NG tube placement. EXAM: ABDOMEN - 1 VIEW COMPARISON:  01/31/2022. FINDINGS: Enteric feeding tube tip projects in the right lower central abdomen, consistent with positioning in the third portion of the duodenum, advanced when compared to the prior study. Normal bowel gas pattern. Percutaneous gastrostomy tube is unchanged. IMPRESSION: 1. Enteric feeding tube tip projects in the expected location of the third portion of the duodenum. Electronically Signed   By: Lajean Manes M.D.   On: 02/01/2022 14:06   DG Abd Portable 1V  Result Date: 01/31/2022 CLINICAL DATA:  NG tube placement. EXAM: PORTABLE ABDOMEN - 1 VIEW COMPARISON:  None Available. FINDINGS: No NG tube visible on the current film. The tip of a feeding catheter is positioned at the pylorus,  directed towards the duodenum. Gastrostomy tube evident. Bowel gas pattern is nonspecific. IMPRESSION: Feeding tube tip is positioned at the pylorus, directed towards the duodenum. Electronically Signed   By: Misty Stanley M.D.   On: 01/31/2022 17:43        Scheduled Meds:  acetaminophen  1,000 mg Per Tube Q8H   amiodarone  200 mg Per Tube Daily   apixaban  5 mg Per Tube BID   Chlorhexidine Gluconate Cloth  6 each Topical Q0600   doxazosin  1 mg Per Tube Daily   famotidine  20 mg Per Tube BID   fluconazole  200 mg Per Tube Daily   fluticasone furoate-vilanterol  1 puff Inhalation Daily   free water  120 mL Per Tube Q4H   leptospermum manuka honey  1 application  Topical BID   mirtazapine  7.5 mg Per Tube  QHS   nutrition supplement (JUVEN)  1 packet Per Tube BID BM   venlafaxine  18.75 mg Per Tube BID WC   Zinc Oxide   Topical BID   Continuous Infusions:   ceFAZolin (ANCEF) IV 2 g (02/01/22 1426)   dextrose 5 % and 0.9% NaCl 50 mL/hr at 02/01/22 1421   feeding supplement (KATE FARMS STANDARD 1.4) 325 mL (02/01/22 1548)     LOS: 8 days    Time spent: 35 minutes    Irine Seal, MD [facility]   To contact the attending provider between 7A-7P or the covering provider during after hours 7P-7A, please log into the web site [email or web address] and access using universal [place] password for that web site. If you do not have the password, please call the hospital operator.  02/01/2022, 5:45 PM

## 2022-02-02 ENCOUNTER — Inpatient Hospital Stay (HOSPITAL_COMMUNITY): Payer: Medicare Other

## 2022-02-02 DIAGNOSIS — D649 Anemia, unspecified: Secondary | ICD-10-CM | POA: Diagnosis not present

## 2022-02-02 DIAGNOSIS — L03311 Cellulitis of abdominal wall: Secondary | ICD-10-CM | POA: Diagnosis not present

## 2022-02-02 DIAGNOSIS — R7989 Other specified abnormal findings of blood chemistry: Secondary | ICD-10-CM | POA: Diagnosis not present

## 2022-02-02 DIAGNOSIS — L03818 Cellulitis of other sites: Secondary | ICD-10-CM | POA: Diagnosis not present

## 2022-02-02 LAB — CBC
HCT: 24.3 % — ABNORMAL LOW (ref 39.0–52.0)
Hemoglobin: 7.5 g/dL — ABNORMAL LOW (ref 13.0–17.0)
MCH: 30.7 pg (ref 26.0–34.0)
MCHC: 30.9 g/dL (ref 30.0–36.0)
MCV: 99.6 fL (ref 80.0–100.0)
Platelets: 105 10*3/uL — ABNORMAL LOW (ref 150–400)
RBC: 2.44 MIL/uL — ABNORMAL LOW (ref 4.22–5.81)
RDW: 19.3 % — ABNORMAL HIGH (ref 11.5–15.5)
WBC: 7.6 10*3/uL (ref 4.0–10.5)
nRBC: 0 % (ref 0.0–0.2)

## 2022-02-02 LAB — GLUCOSE, CAPILLARY
Glucose-Capillary: 102 mg/dL — ABNORMAL HIGH (ref 70–99)
Glucose-Capillary: 105 mg/dL — ABNORMAL HIGH (ref 70–99)
Glucose-Capillary: 146 mg/dL — ABNORMAL HIGH (ref 70–99)
Glucose-Capillary: 148 mg/dL — ABNORMAL HIGH (ref 70–99)

## 2022-02-02 LAB — BASIC METABOLIC PANEL
Anion gap: 3 — ABNORMAL LOW (ref 5–15)
BUN: 36 mg/dL — ABNORMAL HIGH (ref 8–23)
CO2: 27 mmol/L (ref 22–32)
Calcium: 8.2 mg/dL — ABNORMAL LOW (ref 8.9–10.3)
Chloride: 109 mmol/L (ref 98–111)
Creatinine, Ser: 0.72 mg/dL (ref 0.61–1.24)
GFR, Estimated: >60 mL/min (ref >60)
Glucose, Bld: 136 mg/dL — ABNORMAL HIGH (ref 70–99)
Potassium: 4.2 mmol/L (ref 3.5–5.1)
Sodium: 139 mmol/L (ref 135–145)

## 2022-02-02 NOTE — Progress Notes (Signed)
PROGRESS NOTE    [name]  XVQ:[id number] DOB: Jan 12, 1933 DOA: 01/24/2022 PCP: [name], DO    Chief Complaint  Patient presents with   feeding tube issue     Brief Narrative:  [name] is a 88 y.o. male with medical history significant of paroxysmal atrial fibrillation, chronic systolic CHF, CAD s/p CABG, sick sinus syndrome s/p pacemaker, COPD, type 2 diabetes, hypertension, hyperlipidemia, chronic thrombocytopenia, recurrent UTI, OSA not on CPAP, dysphagia with PEG tube who presents with concerns of purulent discharge from feeding tube.  S/p upsizing of g tube on 6/3.  Surgery c/s and recommended keeping g tube to gravity to ensure no pressure of phlange on skin and balloon not pulled up against intraabdominal wall.  He currently has a cortrak for post pyloric tube feeds.  Difficult disposition at this point as he's going to need cortrak for nutrition while g tube is to gravity.    See below for additional details    Assessment & Plan:  Principal Problem:   Cellulitis Active Problems:   Hypotension   Paroxysmal atrial fibrillation (HCC)   HFrEF (heart failure with reduced ejection fraction) (HCC)   C6 cervical fracture (HCC)   Controlled type 2 diabetes mellitus without complication, without long-term current use of insulin (HCC)   Delirium   S/P CABG x 3   Hypernatremia   Dysphagia   Anemia   Urinary retention   Diarrhea   Abnormal LFTs   Pressure ulcer   Goals of care, counseling/discussion   Status post insertion of percutaneous endoscopic gastrostomy (PEG) tube ([place])    Assessment and Plan: * Cellulitis Pt has dysphagia and is s/p PEG tube placement on 5/24.  Presents with malodor and purulent discharge from PEG tube site. CT abd/pelvis showed 6.4x2.2 cm area of inflammatory/infectious phlegmon in subcutaneous plaine at site of placement of gastrostomy, pockets of air in the subcutaneous plane at the site of g tube placement (due to open wound  in skin or suggest infection with gas producing organisms).  No definite demonstrable thick walled loculated fluid collection. S/p fluoroscopic guided replacement and up sizing of now 24 fr ballon retention gastrostomy tube 6/3 per Dr. Pascal Lux, suspected pressure ulcer about medial aspect of g tube insertion - likely due to insertion angle of tube and external retention disc being too tight Appreciate surgery recommedations  - g tube to gravity  - local wound care (zinc oxide to intact portion of epidermis, dressing changes around g tube 4x daily)  - post pyloric nutrition  - wound care  - available as needed, IR can follow up prn as well Issue at this time is g tube is to gravity and he has cortrak while we're allowing wound to heal.  It's not clear how long he'll need g tube to gravity and post pyloric feeding at this point.  -narrow abx, will plan short course of ancef given overall improvement (he's tolerated cephalosporins and augmentin in past per pharmacy) -Wound cultures are pending (moderate staph epi, moderate candida albicans - not sure how clinically relevant these are - will follow) -blood cultures negative. -Wound care consulted, appreciate recs - medi honey added -On 01/29/2022 in the evening it was noted per RN and daughter that wound had looked worse and crossed, came and assessed the patient and IV antibiotics broadened back to vancomycin and Merrem. -Patient reassessed by general surgery again 01/30/2022 and stating wound from a pressure necrosis from his G-tube, recommending continued current wound care,  de-escalation of antibiotics, and if wound is not shrinking over the next 1 to 2 weeks other considerations will need to be entertained at that time.  General surgery recommending continuing current wound care, postpyloric feeds. - IV antibiotics have been narrowed back to Ancef to complete a 10-day course of antibiotic treatment. -Appreciate general surgery's input and  recommendations.  Hypotension Presented with hypotension with SBP of 90 over 50s.  This has improved with fluid but remains soft.  Previously on Lasix, Entresto, Aldactone and beta-blockers at home but these were hold during last admission.   -Continue to hold hypertensive medications secondary to low blood pressure. -Saline lock IV fluids.  -repeat echo -> EF 40-45% - BP overall improved, follow  C6 cervical fracture ([place]) This is from a ground-level fall back in April.  Was on a soft cervical collar and continue follow-up with neurosurgery. Discussed with Dr. Ellene Route, he notes ok to d/c c collar Flex ex films  with known subacute fx through anterior inferior aspect of c 6 vertebral body, anterior osteophyte not well seen, C6-7 space normally aligned without abnormal motion on extension, not well assessed on flexion (see report) -Patient seen by neurosurgery who states C6 fracture is healed and patient can be removed from collar and pursue activities as tolerated.  HFrEF (heart failure with reduced ejection fraction) ([place]) Appears euvolemic on exam. Echo as above,  Paroxysmal atrial fibrillation (HCC) -Currently rate controlled.   -Continue amiodarone. -Eliquis for anticoagulation.  Delirium Delirium precautions  Controlled type 2 diabetes mellitus without complication, without long-term current use of insulin (HCC) Controlled.  Last A1c 5.2% on 5/15. -CBG 102 this morning.   -Follow.  Hypernatremia - Likely secondary to dehydration.   -Improved with hydration.   -Patient placed on gentle hydration 01/31/2022 due to malposition of cortrack tube.  S/P CABG x 3 Stable and asymptomatic from cardiac standpoint.  Anemia  Anemia labs c/w aocd, iron def -Hemoglobin stable at 7.5. -Transfusion threshold hemoglobin < 7.    Dysphagia S/p PEG tube by VIR on 5/24, s/p replacement as above -Dietitian consulted for tube feeding  NPO for now with g tube to gravity, consider small  amounts Po for comfort. -The afternoon of 01/31/2022, concerned that tube feeds coming out of PEG tube site and concern for malposition of cortrack. -Abdominal films obtained and feeding tube tip noted at the position of the pylorus directed towards the duodenum. -Advanced cortrack 4 cm, 02/01/2022 with repeat abdominal films showing appropriate placement of cortrack tube.  -Resumed tube feeds. -IV fluids have been discontinued. -Dietitian following and appreciate their input and recommendations.  Urinary retention Foley catheter placed on 5/27 due to urinary retention at recent admission.  He has planned outpatient follow-up with urology. -Patient underwent successful voiding trial.  -Urine output 400 cc over the past 24 hours. -Continue doxazosin. -Monitor urine output.  Diarrhea Daughter reported persistent diarrhea since the start of tube feed on 5/25.  -Tube feeds changed with some improvement with diarrhea. Negative GI pathogen panel Negative c diff  Abnormal LFTs Mildly elevated -LFTs trending down. -Follow-up.  Pressure ulcer Pressure Injury 01/24/22 Buttocks Deep Tissue Pressure Injury - Purple or maroon localized area of discolored intact skin or blood-filled blister due to damage of underlying soft tissue from pressure and/or shear. (Active)  01/24/22 2036  Location: Buttocks  Location Orientation:   Staging: Deep Tissue Pressure Injury - Purple or maroon localized area of discolored intact skin or blood-filled blister due to damage of underlying soft tissue  from pressure and/or shear.  Wound Description (Comments):   Present on Admission: Yes   Wound c/s   Goals of care, counseling/discussion Not currently interested in palliative care discussions, noted we can review pending course         DVT prophylaxis: Eliquis Code Status: Full. Family Communication: Updated patient and daughter at bedside. Disposition: CIR versus SNF  Status is: Inpatient Remains  inpatient appropriate because: Severity of illness   Consultants:  Neurosurgery: Dr. Ellene Route 01/28/2022 General surgery: Dr. Kieth Brightly 01/26/2022 Wound care RN   Procedures:  CT abdomen and pelvis 01/24/2022 Plain films of the cervical spine 01/27/2022 Fluoroscopic guided replacement of gastrostomy tube with upsizing 01/25/2022 interventional radiology 2D echo 01/27/2022    Antimicrobials:  Diflucan 01/25/2022>>>> 02/02/2022 IV Ancef 01/27/2022>>>>> 02/02/2022 IV Rocephin 01/27/2022 x 1 dose IV Merrem 01/24/2022>>>> 01/27/2022 IV vancomycin 01/24/2022>>>>> 01/27/2022    Subjective: Patient sleeping but easily arousable.  Daughter at bedside, feels tube feeds may be coming out of PEG tube again.  Patient denies any chest pain.  No shortness of breath.  No abdominal pain.   Objective: Vitals:   02/01/22 2038 02/02/22 0500 02/02/22 0624 02/02/22 0745  BP: (!) 142/116  119/72 (!) 116/56  Pulse: 86  65 70  Resp: '18  17 16  '$ Temp: 97.9 F (36.6 C)  97.6 F (36.4 C) (!) 97.4 F (36.3 C)  TempSrc: Oral  Axillary Oral  SpO2: 92%  96% 96%  Weight:  115.2 kg    Height:        Intake/Output Summary (Last 24 hours) at 02/02/2022 1612 Last data filed at 02/02/2022 1513 Gross per 24 hour  Intake 797.7 ml  Output 900 ml  Net -102.3 ml   Filed Weights   01/24/22 2324 02/01/22 0500 02/02/22 0500  Weight: 86.3 kg 115.2 kg 115.2 kg    Examination:  General exam: Alert.  NAD. Cortrack. Respiratory system: CTA B.  No wheezes, no crackles, no rhonchi.  Fair air movement.  Speaking in full sentences.  Cardiovascular system: Regular rate rhythm no murmurs rubs or gallops.  No JVD.  No lower extremity edema. Gastrointestinal system: Abdomen is soft, nontender, nondistended, positive bowel sounds.  G-tube noted with dressing intact at insertion site.  Central nervous system: Alert and oriented. No focal neurological deficits. Extremities: Symmetric 5 x 5 power. Skin: No rashes, lesions or ulcers Psychiatry:  Judgement and insight appear normal. Mood & affect appropriate.     Data Reviewed:   CBC: Recent Labs  Lab 01/28/22 0146 01/29/22 0243 01/30/22 0503 01/31/22 0906 02/01/22 0152 02/02/22 0110  WBC 5.9 4.7 5.8 7.1 7.9 7.6  NEUTROABS 3.3 2.8 3.2  --   --   --   HGB 7.7* 7.4* 8.3* 8.1* 8.2* 7.5*  HCT 25.4* 24.5* 27.4* 25.8* 26.2* 24.3*  MCV 100.0 100.8* 100.4* 100.8* 100.0 99.6  PLT 113* 106* 108* 105* 103* 105*    Basic Metabolic Panel: Recent Labs  Lab 01/28/22 0146 01/29/22 0243 01/29/22 1635 01/30/22 0503 01/31/22 0906 02/01/22 0152 02/02/22 0110  NA 146* 144  --  143 139 140 139  K 3.4* 3.3*  --  3.6 4.1 4.7 4.2  CL 112* 108  --  108 105 107 109  CO2 23 26  --  '27 27 25 27  '$ GLUCOSE 127* 145*  --  135* 141* 102* 136*  BUN 27* 31*  --  28* 27* 33* 36*  CREATININE 1.05 0.93  --  0.85 0.73 0.76 0.72  CALCIUM 9.3  8.8*  --  8.5* 8.1* 8.4* 8.2*  MG 2.1 2.1 2.0 2.1 2.2  --   --   PHOS 2.3* 2.4* 2.5 2.8 2.7  --   --     GFR: Estimated Creatinine Clearance: 85.6 mL/min (by C-G formula based on SCr of 0.72 mg/dL).  Liver Function Tests: Recent Labs  Lab 01/27/22 0313 01/28/22 0433 01/29/22 0243 01/30/22 0503 01/31/22 0906  AST 83* 77* 51* 39  --   ALT 92* 70* 31 14  --   ALKPHOS 112 119 111 105  --   BILITOT 0.8 0.6 0.5 0.7  --   PROT 4.2* 4.7* 4.2* 4.4*  --   ALBUMIN 1.6* 1.6* 1.7* 1.7* 1.6*    CBG: Recent Labs  Lab 02/01/22 1211 02/01/22 1831 02/02/22 0005 02/02/22 0616 02/02/22 1210  GLUCAP 86 116* 148* 102* 146*     Recent Results (from the past 240 hour(s))  Urine Culture     Status: Abnormal   Collection Time: 01/24/22  3:12 PM   Specimen: Urine, Clean Catch  Result Value Ref Range Status   Specimen Description URINE, CLEAN CATCH  Final   Special Requests   Final    NONE Performed at [facility], [place] [address] [place], [place] [address]    Culture (A)  Final    40,000 COLONIES/mL ENTEROCOCCUS FAECIUM VANCOMYCIN RESISTANT  ENTEROCOCCUS ISOLATED    Report Status 01/27/2022 FINAL  Final   Organism ID, Bacteria ENTEROCOCCUS FAECIUM (A)  Final      Susceptibility   Enterococcus faecium - MIC*    AMPICILLIN >=32 RESISTANT Resistant     NITROFURANTOIN 64 INTERMEDIATE Intermediate     VANCOMYCIN >=32 RESISTANT Resistant     LINEZOLID 2 SENSITIVE Sensitive     * 40,000 COLONIES/mL ENTEROCOCCUS FAECIUM  Blood culture (routine x 2)     Status: None   Collection Time: 01/24/22  3:24 PM   Specimen: BLOOD LEFT HAND  Result Value Ref Range Status   Specimen Description BLOOD LEFT HAND  Final   Special Requests   Final    BOTTLES DRAWN AEROBIC AND ANAEROBIC Blood Culture adequate volume   Culture   Final    NO GROWTH 5 DAYS Performed at [facility], [place] [address], [place], [place] [address]    Report Status 01/29/2022 FINAL  Final  Blood culture (routine x 2)     Status: None   Collection Time: 01/24/22  3:24 PM   Specimen: BLOOD RIGHT HAND  Result Value Ref Range Status   Specimen Description BLOOD RIGHT HAND  Final   Special Requests   Final    BOTTLES DRAWN AEROBIC AND ANAEROBIC Blood Culture adequate volume   Culture   Final    NO GROWTH 5 DAYS Performed at [facility], [place] [address], [place],  [address]    Report Status 01/29/2022 FINAL  Final  Gastrointestinal Panel by PCR , Stool     Status: None   Collection Time: 01/24/22  9:08 PM   Specimen: Peg Site; Stool  Result Value Ref Range Status   Campylobacter species NOT DETECTED NOT DETECTED Final   Plesimonas shigelloides NOT DETECTED NOT DETECTED Final   Salmonella species NOT DETECTED NOT DETECTED Final   Yersinia enterocolitica NOT DETECTED NOT DETECTED Final   Vibrio species NOT DETECTED NOT DETECTED Final   Vibrio cholerae NOT DETECTED NOT DETECTED Final   Enteroaggregative E coli (EAEC) NOT DETECTED NOT DETECTED Final  Enteropathogenic E coli (EPEC) NOT DETECTED NOT DETECTED Final   Enterotoxigenic E coli (ETEC) NOT  DETECTED NOT DETECTED Final   Shiga like toxin producing E coli (STEC) NOT DETECTED NOT DETECTED Final   Shigella/Enteroinvasive E coli (EIEC) NOT DETECTED NOT DETECTED Final   Cryptosporidium NOT DETECTED NOT DETECTED Final   Cyclospora cayetanensis NOT DETECTED NOT DETECTED Final   Entamoeba histolytica NOT DETECTED NOT DETECTED Final   Giardia lamblia NOT DETECTED NOT DETECTED Final   Adenovirus F40/41 NOT DETECTED NOT DETECTED Final   Astrovirus NOT DETECTED NOT DETECTED Final   Norovirus GI/GII NOT DETECTED NOT DETECTED Final   Rotavirus A NOT DETECTED NOT DETECTED Final   Sapovirus (I, II, IV, and V) NOT DETECTED NOT DETECTED Final    Comment: Performed at [facility], [place] [place], [place] [address]  C Difficile Quick Screen w PCR reflex     Status: None   Collection Time: 01/24/22  9:08 PM   Specimen: Peg Site; Stool  Result Value Ref Range Status   C Diff antigen NEGATIVE NEGATIVE Final   C Diff toxin NEGATIVE NEGATIVE Final   C Diff interpretation No C. difficile detected.  Final    Comment: Performed at [facility], [place] [address] [place], [place] [address]  Aerobic Culture w Gram Stain (superficial specimen)     Status: None   Collection Time: 01/24/22 10:07 PM   Specimen: Wound  Result Value Ref Range Status   Specimen Description WOUND  Final   Special Requests PEG SITE  Final   Gram Stain   Final    NO SQUAMOUS EPITHELIAL CELLS SEEN FEW WBC SEEN FEW GRAM POSITIVE COCCI ABUNDANT GRAM NEGATIVE RODS Performed at [facility], [address], [place], [place] [address]    Culture   Final    MODERATE STAPHYLOCOCCUS EPIDERMIDIS MODERATE CANDIDA ALBICANS    Report Status 01/27/2022 FINAL  Final   Organism ID, Bacteria STAPHYLOCOCCUS EPIDERMIDIS  Final      Susceptibility   Staphylococcus epidermidis - MIC*    CIPROFLOXACIN >=8 RESISTANT Resistant     ERYTHROMYCIN <=0.25 SENSITIVE Sensitive     GENTAMICIN <=0.5 SENSITIVE Sensitive      OXACILLIN RESISTANT Resistant     TETRACYCLINE <=1 SENSITIVE Sensitive     VANCOMYCIN 2 SENSITIVE Sensitive     TRIMETH/SULFA 20 SENSITIVE Sensitive     CLINDAMYCIN >=8 RESISTANT Resistant     RIFAMPIN <=0.5 SENSITIVE Sensitive     Inducible Clindamycin NEGATIVE Sensitive     * MODERATE STAPHYLOCOCCUS EPIDERMIDIS         Radiology Studies: DG Abd Portable 1V  Result Date: 02/02/2022 CLINICAL DATA:  NG tube placement EXAM: PORTABLE ABDOMEN - 1 VIEW COMPARISON:  02/01/2022 FINDINGS: Nonobstructive pattern of bowel gas. Percutaneous gastrostomy tube projects with tip and balloon over the expected gastric body. Enteric feeding tube is positioned with tip in the transverse portion of the duodenum. No obvious free air. IMPRESSION: Enteric feeding tube tip in the transverse portion of the duodenum. Percutaneous gastrostomy tube projects with tip and balloon over the expected gastric body. Electronically Signed   By: Delanna Ahmadi M.D.   On: 02/02/2022 13:10   DG Abd 1 View  Result Date: 02/01/2022 CLINICAL DATA:  NG tube placement. EXAM: ABDOMEN - 1 VIEW COMPARISON:  01/31/2022. FINDINGS: Enteric feeding tube tip projects in the right lower central abdomen, consistent with positioning in the third portion of the duodenum, advanced when compared to  the prior study. Normal bowel gas pattern. Percutaneous gastrostomy tube is unchanged. IMPRESSION: 1. Enteric feeding tube tip projects in the expected location of the third portion of the duodenum. Electronically Signed   By: Lajean Manes M.D.   On: 02/01/2022 14:06   DG Abd Portable 1V  Result Date: 01/31/2022 CLINICAL DATA:  NG tube placement. EXAM: PORTABLE ABDOMEN - 1 VIEW COMPARISON:  None Available. FINDINGS: No NG tube visible on the current film. The tip of a feeding catheter is positioned at the pylorus, directed towards the duodenum. Gastrostomy tube evident. Bowel gas pattern is nonspecific. IMPRESSION: Feeding tube tip is positioned at  the pylorus, directed towards the duodenum. Electronically Signed   By: Misty Stanley M.D.   On: 01/31/2022 17:43        Scheduled Meds:  acetaminophen  1,000 mg Per Tube Q8H   amiodarone  200 mg Per Tube Daily   apixaban  5 mg Per Tube BID   Chlorhexidine Gluconate Cloth  6 each Topical Q0600   doxazosin  1 mg Per Tube Daily   famotidine  20 mg Per Tube BID   fluconazole  200 mg Per Tube Daily   fluticasone furoate-vilanterol  1 puff Inhalation Daily   free water  120 mL Per Tube Q4H   leptospermum manuka honey  1 application  Topical BID   mirtazapine  7.5 mg Per Tube QHS   nutrition supplement (JUVEN)  1 packet Per Tube BID BM   venlafaxine  18.75 mg Per Tube BID WC   Zinc Oxide   Topical BID   Continuous Infusions:  feeding supplement (KATE FARMS STANDARD 1.4) 325 mL (02/02/22 1122)     LOS: 9 days    Time spent: 35 minutes    Irine Seal, MD [facility]   To contact the attending provider between 7A-7P or the covering provider during after hours 7P-7A, please log into the web site [email or web address] and access using universal [place] password for that web site. If you do not have the password, please call the hospital operator.  02/02/2022, 4:12 PM

## 2022-02-03 DIAGNOSIS — L03818 Cellulitis of other sites: Secondary | ICD-10-CM | POA: Diagnosis not present

## 2022-02-03 DIAGNOSIS — R7989 Other specified abnormal findings of blood chemistry: Secondary | ICD-10-CM | POA: Diagnosis not present

## 2022-02-03 DIAGNOSIS — L03311 Cellulitis of abdominal wall: Secondary | ICD-10-CM | POA: Diagnosis not present

## 2022-02-03 DIAGNOSIS — D649 Anemia, unspecified: Secondary | ICD-10-CM | POA: Diagnosis not present

## 2022-02-03 LAB — CBC
HCT: 26.4 % — ABNORMAL LOW (ref 39.0–52.0)
Hemoglobin: 8.3 g/dL — ABNORMAL LOW (ref 13.0–17.0)
MCH: 31.7 pg (ref 26.0–34.0)
MCHC: 31.4 g/dL (ref 30.0–36.0)
MCV: 100.8 fL — ABNORMAL HIGH (ref 80.0–100.0)
Platelets: 96 10*3/uL — ABNORMAL LOW (ref 150–400)
RBC: 2.62 MIL/uL — ABNORMAL LOW (ref 4.22–5.81)
RDW: 19.8 % — ABNORMAL HIGH (ref 11.5–15.5)
WBC: 7.6 10*3/uL (ref 4.0–10.5)
nRBC: 0 % (ref 0.0–0.2)

## 2022-02-03 LAB — GLUCOSE, CAPILLARY
Glucose-Capillary: 112 mg/dL — ABNORMAL HIGH (ref 70–99)
Glucose-Capillary: 113 mg/dL — ABNORMAL HIGH (ref 70–99)
Glucose-Capillary: 115 mg/dL — ABNORMAL HIGH (ref 70–99)
Glucose-Capillary: 122 mg/dL — ABNORMAL HIGH (ref 70–99)

## 2022-02-03 LAB — RENAL FUNCTION PANEL
Albumin: 1.5 g/dL — ABNORMAL LOW (ref 3.5–5.0)
Anion gap: 6 (ref 5–15)
BUN: 36 mg/dL — ABNORMAL HIGH (ref 8–23)
CO2: 24 mmol/L (ref 22–32)
Calcium: 8.2 mg/dL — ABNORMAL LOW (ref 8.9–10.3)
Chloride: 107 mmol/L (ref 98–111)
Creatinine, Ser: 0.72 mg/dL (ref 0.61–1.24)
GFR, Estimated: >60 mL/min (ref >60)
Glucose, Bld: 122 mg/dL — ABNORMAL HIGH (ref 70–99)
Phosphorus: 2.4 mg/dL — ABNORMAL LOW (ref 2.5–4.6)
Potassium: 4.5 mmol/L (ref 3.5–5.1)
Sodium: 137 mmol/L (ref 135–145)

## 2022-02-03 LAB — MAGNESIUM: Magnesium: 2.1 mg/dL (ref 1.7–2.4)

## 2022-02-03 MED ORDER — PROSOURCE TF PO LIQD
45.0000 mL | Freq: Every day | ORAL | Status: DC
  Administered 2022-02-03 – 2022-02-19 (×17): 45 mL
  Filled 2022-02-03 (×17): qty 45

## 2022-02-03 MED ORDER — JEVITY 1.5 CAL/FIBER PO LIQD
1000.0000 mL | ORAL | Status: DC
  Administered 2022-02-03 – 2022-02-13 (×11): 1000 mL
  Filled 2022-02-03 (×13): qty 1000

## 2022-02-03 NOTE — Progress Notes (Signed)
Physical Therapy Treatment Patient Details Name: [name] MRN: [id number] DOB: [birth year] Today's Date: 02/03/2022   History of Present Illness [name] is a 88 y.o. male who presented with purulent discharge from feeding tube. Due to likely pressure wound related to G tube, IR upsized G tube on 6/3 and cortrak was placed for temporary nutrition alternative. PMH: atrial fibrillation, chronic systolic CHF, CAD s/p CABG, sick sinus syndrome s/p pacemaker, COPD, type 2 diabetes, hypertension, hyperlipidemia, chronic thrombocytopenia, recurrent UTI, OSA not on CPAP, dysphagia with PEG tube , hx of C6 fx    PT Comments    Pt admitted with above diagnosis. Progress slow due to pt continues with intermittent lethargy and at times has difficulty following commands.  Pt did appear to have incr strength in bil UE and LEs today.  Will continue to follow pt and progress as able.  Pt currently with functional limitations due to balance and endurance deficits. Pt will benefit from skilled PT to increase their independence and safety with mobility to allow discharge to the venue listed below.      Recommendations for follow up therapy are one component of a multi-disciplinary discharge planning process, led by the attending physician.  Recommendations may be updated based on patient status, additional functional criteria and insurance authorization.  Follow Up Recommendations  Acute inpatient rehab (3hours/day) (family electing to D/C home)     Assistance Recommended at Discharge Frequent or constant Supervision/Assistance  Patient can return home with the following Two people to help with walking and/or transfers;Two people to help with bathing/dressing/bathroom;Assistance with cooking/housework;Assistance with feeding;Direct supervision/assist for medications management;Assist for transportation;Direct supervision/assist for financial management;Help with stairs or ramp for entrance   Equipment  Recommendations  Wheelchair (measurements PT);Other (comment);Wheelchair cushion (measurements PT) (hoyer lift, air mattress overlay for bed)    Recommendations for Other Services Rehab consult (family requesting)     Precautions / Restrictions Precautions Precautions: Fall;Other (comment) Precaution Booklet Issued: No Precaution Comments: G tube draining to gravity (ok to mobilize with therapy per MD); soft collar discontinued Restrictions Weight Bearing Restrictions: No RLE Weight Bearing: Weight bearing as tolerated LLE Weight Bearing: Weight bearing as tolerated     Mobility  Bed Mobility Overal bed mobility: Needs Assistance Bed Mobility: Supine to Sit, Sit to Supine Rolling: Mod assist Sidelying to sit: Max assist Supine to sit: Max assist, +2 for physical assistance, +2 for safety/equipment Sit to supine: Total assist, +2 for physical assistance, +2 for safety/equipment   General bed mobility comments: Cleaned pt initially with total assist.  Pt needed max cues for sequencing and assist to initiate LEs to EOB. Pt assisting some to lift trunk via handheld assist though significant assist needed to get EOB. Total A x 2 to get back to bed with pt difficulty sequencing    Transfers Overall transfer level: Needs assistance Equipment used: 2 person hand held assist Transfers: Sit to/from Stand Sit to Stand: +2 physical assistance, From elevated surface, Total assist     Squat pivot transfers: +2 physical assistance, Total assist     General transfer comment: Attempted standing at bedside with +2 assist, pt able to partially lift bottom from bed with use of pad.  Difficult to asssess pt effort due to use of pad however he was able to weight bear on LEs.    Ambulation/Gait               General Gait Details: unable today   Stairs  Wheelchair Mobility    Modified Rankin (Stroke Patients Only)       Balance Overall balance assessment: Needs  assistance Sitting-balance support: No upper extremity supported, Feet supported, Single extremity supported Sitting balance-Leahy Scale: Poor Sitting balance - Comments: reliant on UE support EOB (primarily of right UE) but able to maintain balance without external support for up to 10 min EOB needing min guard to min assist.  Pt at times leaning posterior and to left. Postural control: Right lateral lean, Posterior lean Standing balance support: Bilateral upper extremity supported, Reliant on assistive device for balance Standing balance-Leahy Scale: Zero Standing balance comment: Cleared buttocks off bed with use of pad and +2 total assist.                            Cognition Arousal/Alertness: Awake/alert Behavior During Therapy: Flat affect, Restless, Anxious Overall Cognitive Status: Impaired/Different from baseline Area of Impairment: Orientation, Attention, Memory, Following commands, Safety/judgement, Awareness, Problem solving                 Orientation Level: Disoriented to, Time, Situation Current Attention Level: Sustained Memory: Decreased recall of precautions, Decreased short-term memory Following Commands: Follows one step commands with increased time Safety/Judgement: Decreased awareness of safety, Decreased awareness of deficits Awareness: Intellectual Problem Solving: Slow processing, Decreased initiation, Requires verbal cues, Requires tactile cues General Comments: kept eyes closed much of session, follows one step directions, yells out at times (more so due to surprise with movement than pain?),. Slow processing to initiate tasks, slower motor planning.        Exercises General Exercises - Upper Extremity Shoulder Flexion: AAROM, Both, 10 reps, Supine General Exercises - Lower Extremity Ankle Circles/Pumps: AROM, Both, 10 reps Heel Slides: AAROM, Both, Supine, 10 reps Straight Leg Raises: Both, AAROM, 5 reps, Supine    General Comments  General comments (skin integrity, edema, etc.): Daughter present, hands on to assist. would like to avoid SNF at DC      Pertinent Vitals/Pain Pain Assessment Pain Assessment: Faces Faces Pain Scale: Hurts little more Breathing: normal Negative Vocalization: none Facial Expression: smiling or inexpressive Body Language: relaxed Consolability: no need to console PAINAD Score: 0 Pain Location: back/bottom with LE ROM Pain Descriptors / Indicators: Moaning Pain Intervention(s): Limited activity within patient's tolerance, Monitored during session, Repositioned    Home Living                          Prior Function            PT Goals (current goals can now be found in the care plan section) Progress towards PT goals: Progressing toward goals    Frequency    Min 3X/week      PT Plan Current plan remains appropriate    Co-evaluation              AM-PAC PT "6 Clicks" Mobility   Outcome Measure  Help needed turning from your back to your side while in a flat bed without using bedrails?: Total Help needed moving from lying on your back to sitting on the side of a flat bed without using bedrails?: Total Help needed moving to and from a bed to a chair (including a wheelchair)?: Total Help needed standing up from a chair using your arms (e.g., wheelchair or bedside chair)?: Total Help needed to walk in hospital room?: Total Help needed climbing 3-5 steps with a  railing? : Total 6 Click Score: 6    End of Session   Activity Tolerance: Patient limited by pain;Patient limited by fatigue Patient left: in bed;with call bell/phone within reach;with bed alarm set;with family/visitor present Nurse Communication: Mobility status;Need for lift equipment PT Visit Diagnosis: History of falling (Z91.81);Pain;Muscle weakness (generalized) (M62.81);Unsteadiness on feet (R26.81)     Time: 0992-7800 PT Time Calculation (min) (ACUTE ONLY): 38 min  Charges:   $Therapeutic Exercise: 8-22 mins $Therapeutic Activity: 23-37 mins                     [facility] M,PT Acute Rehab Services [phone]    Alvira Philips 02/03/2022, 4:37 PM

## 2022-02-03 NOTE — Consult Note (Signed)
[place] Nurse ostomy follow up   Dr.Thompson relays that patient's daughter has some questions about the frequency with which dressing changes are to be performed to the G-tube wound. My associate's notes from 01/30/22 encounter indicate that dressing changes are to be performed every other day and PRN drainage strike through onto exterior of dressing. I stop by the patient's room to discuss with daughter and she has just stepped out.  Her sister, and mother are in the room. We attempt to call her on her mobile phone, but there is no answer.   I relay the message to the sister that the dressing change order is for every other day and as needed, more often. The sister states that she will relay the message to her sister upon her return to the room.  [facility] nursing team will not follow, but will remain available to this patient, the nursing and medical teams.  Please re-consult if needed.  Thank you for inviting [place] to participate in this patient's Plan of Care.  Maudie Flakes, MSN, RN, CNS, [place], Serita Grammes, [employer], [employer] phone:  [phone]

## 2022-02-03 NOTE — Plan of Care (Signed)

## 2022-02-03 NOTE — Progress Notes (Signed)
Nutrition Follow-up  DOCUMENTATION CODES:  Severe malnutrition in context of chronic illness  INTERVENTION:  Adjust TF as follows: Jevity 1.5 at 43m/h Free water flush [id number] Prosource TF 1x/d This provides 2000kcal, 95g of protein, 172342mree water (TF+flush)  NUTRITION DIAGNOSIS:  Severe Malnutrition (in the context of chronic illness) related to dysphagia as evidenced by severe fat depletion, severe muscle depletion. - remains applicable  GOAL:  Patient will meet greater than or equal to 90% of their needs - being addressed with TF  MONITOR:  Diet advancement, Labs, Weight trends, TF tolerance, Skin, I & O's  REASON FOR ASSESSMENT:  Consult Assessment of nutrition requirement/status  ASSESSMENT:  88 26o. male with medical history significant of paroxysmal atrial fibrillation, chronic systolic CHF, CAD s/p CABG x 3, sick sinus syndrome s/p pacemaker, COPD, type 2 diabetes, hypertension, hyperlipidemia, chronic thrombocytopenia, incisional hernia s/p repair 2004, inguinal hernia repair, recurrent UTI, OSA not on CPAP, prostate ca, COVID 19 (6/22), AAA, GERD, CVA and dysphagia s/p IR G tube 5/24 who presents with concerns of purulent discharge from feeding tube and diarrhea.  6/3 - IR replacement of 24 Fr balloon retention gastrostomy tube  6/5 - cortrak tube placement (post-pyloric)  Noted that pt developed a necrotic area around G-tube site. Tube is current set to gravity to minimize pressure on the skin. Cortrak tube was placed so that feeds could be resumed.  Pt working with PT at the time of assessment, daughter at bedside about to talk about nutrition hx. Daughter reports that pt continues to have loose stools, but that it has been some better than the Osmolite. Will trial Jevity for added fiber to help add bulk to stool. Otherwise, pt seems to be tolerating well.  Discussed changes with RN.  Daughter reports that pt will likely be at hospital for several weeks as he  needs the cortrak tube for nutrition until g-tube site heals. Educated that pt could be discharged home with cortrak tube, but that a facility would not accept him. Family would be in charge of maintaining tube, but that it is not unsafe to send pt home with it. Daughter did worry that if something were to happen to tube in the interim pt would have to be readmitted.   Nutritionally Relevant Medications: Scheduled Meds:  doxazosin  1 mg Per Tube Daily   famotidine  20 mg Per Tube BID   fluconazole  200 mg Per Tube Daily   free water  120 mL Per Tube Q4H   mirtazapine  7.5 mg Per Tube QHS   JUVEN  1 packet Per Tube BID BM   Continuous Infusions:  feeding supplement (KATE FARMS STANDARD 1.4) 325 mL (02/03/22 0642)   PRN Meds: alum & mag hydroxide-simeth  Labs Reviewed: BUN 36 Phosphorus 2.4 CBG ranges from 102-148 mg/dL over the last 24 hours  NUTRITION - FOCUSED PHYSICAL EXAM: Flowsheet Row Most Recent Value  Orbital Region Severe depletion  Upper Arm Region Severe depletion  Thoracic and Lumbar Region Severe depletion  Buccal Region Severe depletion  Temple Region Moderate depletion  Clavicle Bone Region Severe depletion  Clavicle and Acromion Bone Region Severe depletion  Scapular Bone Region Severe depletion  Dorsal Hand Severe depletion  Patellar Region Severe depletion  Anterior Thigh Region Severe depletion  Posterior Calf Region Severe depletion  Edema (RD Assessment) None  Hair Reviewed  Eyes Reviewed  Mouth Reviewed  Skin Reviewed  Nails Reviewed   Diet Order:   Diet Order  Diet NPO time specified  Diet effective now                  EDUCATION NEEDS:  Education needs have been addressed (with pt's daughter)  Skin:  Skin Assessment: Reviewed RN Assessment (ecchymosis, DTI buttocks)  Last BM:  6/11 - type 7  Height:  Ht Readings from Last 1 Encounters:  01/25/22 '5\' 11"'$  (1.803 m)   Weight:  Wt Readings from Last 1 Encounters:   02/03/22 117 kg   Ideal Body Weight:  78 kg  BMI:  Body mass index is 35.98 kg/m.  Estimated Nutritional Needs:  Kcal:  1900-2200kcal/day Protein:  95-110g/day Fluid:  1.9-2.2L/day   Ranell Patrick, RD, LDN Clinical Dietitian RD pager # available in [facility]  After hours/weekend pager # available in [facility]

## 2022-02-03 NOTE — Progress Notes (Signed)
PROGRESS NOTE    [name]  KZL:[id number] DOB: 1932-09-04 DOA: 01/24/2022 PCP: [name], DO    Chief Complaint  Patient presents with   feeding tube issue     Brief Narrative:  [name] is a 88 y.o. male with medical history significant of paroxysmal atrial fibrillation, chronic systolic CHF, CAD s/p CABG, sick sinus syndrome s/p pacemaker, COPD, type 2 diabetes, hypertension, hyperlipidemia, chronic thrombocytopenia, recurrent UTI, OSA not on CPAP, dysphagia with PEG tube who presents with concerns of purulent discharge from feeding tube.  S/p upsizing of g tube on 6/3.  Surgery c/s and recommended keeping g tube to gravity to ensure no pressure of phlange on skin and balloon not pulled up against intraabdominal wall.  He currently has a cortrak for post pyloric tube feeds.  Difficult disposition at this point as he's going to need cortrak for nutrition while g tube is to gravity.    See below for additional details    Assessment & Plan:  Principal Problem:   Cellulitis Active Problems:   Hypotension   Paroxysmal atrial fibrillation (HCC)   HFrEF (heart failure with reduced ejection fraction) (HCC)   C6 cervical fracture (HCC)   Controlled type 2 diabetes mellitus without complication, without long-term current use of insulin (HCC)   Delirium   S/P CABG x 3   Hypernatremia   Dysphagia   Anemia   Urinary retention   Diarrhea   Abnormal LFTs   Pressure ulcer   Goals of care, counseling/discussion   Status post insertion of percutaneous endoscopic gastrostomy (PEG) tube ([place])    Assessment and Plan: * Cellulitis Pt has dysphagia and is s/p PEG tube placement on 5/24.  Presents with malodor and purulent discharge from PEG tube site. CT abd/pelvis showed 6.4x2.2 cm area of inflammatory/infectious phlegmon in subcutaneous plaine at site of placement of gastrostomy, pockets of air in the subcutaneous plane at the site of g tube placement (due to open wound  in skin or suggest infection with gas producing organisms).  No definite demonstrable thick walled loculated fluid collection. S/p fluoroscopic guided replacement and up sizing of now 24 fr ballon retention gastrostomy tube 6/3 per Dr. Pascal Lux, suspected pressure ulcer about medial aspect of g tube insertion - likely due to insertion angle of tube and external retention disc being too tight Appreciate surgery recommedations  - g tube to gravity  - local wound care (zinc oxide to intact portion of epidermis, dressing changes around g tube 4x daily)  - post pyloric nutrition  - wound care  - available as needed, IR can follow up prn as well Issue at this time is g tube is to gravity and he has cortrak while we're allowing wound to heal.  It's not clear how long he'll need g tube to gravity and post pyloric feeding at this point.  -narrow abx, will plan short course of ancef given overall improvement (he's tolerated cephalosporins and augmentin in past per pharmacy) -Wound cultures are pending (moderate staph epi, moderate candida albicans - not sure how clinically relevant these are - will follow) -blood cultures negative. -Wound care consulted, appreciate recs - medi honey added -On 01/29/2022 in the evening it was noted per RN and daughter that wound had looked worse and crossed, came and assessed the patient and IV antibiotics broadened back to vancomycin and Merrem. -Patient reassessed by general surgery again 01/30/2022 and stating wound from a pressure necrosis from his G-tube, recommending continued current wound care,  de-escalation of antibiotics, and if wound is not shrinking over the next 1 to 2 weeks other considerations will need to be entertained at that time.  General surgery recommending continuing current wound care, postpyloric feeds. - IV antibiotics have been narrowed back to Ancef to complete a 10-day course of antibiotic treatment. -Appreciate general surgery's input and  recommendations.  Hypotension Presented with hypotension with SBP of 90 over 50s.  This has improved with fluid but remains soft.  Previously on Lasix, Entresto, Aldactone and beta-blockers at home but these were hold during last admission.   -Continue to hold hypertensive medications secondary to low blood pressure. -Saline lock IV fluids.  -repeat echo -> EF 40-45% - BP overall improved, follow  C6 cervical fracture ([place]) This is from a ground-level fall back in April.  Was on a soft cervical collar and continue follow-up with neurosurgery. Discussed with Dr. Ellene Route, he notes ok to d/c c collar Flex ex films  with known subacute fx through anterior inferior aspect of c 6 vertebral body, anterior osteophyte not well seen, C6-7 space normally aligned without abnormal motion on extension, not well assessed on flexion (see report) -Patient seen by neurosurgery who states C6 fracture is healed and patient can be removed from collar and pursue activities as tolerated.  HFrEF (heart failure with reduced ejection fraction) ([place]) Appears euvolemic on exam. Echo as above,  Paroxysmal atrial fibrillation (HCC) -Currently rate controlled.   -Continue amiodarone. -Eliquis for anticoagulation.  Delirium Delirium precautions  Controlled type 2 diabetes mellitus without complication, without long-term current use of insulin (HCC) Controlled.  Last A1c 5.2% on 5/15. -CBG 112 this morning.   -Follow.  Hypernatremia - Likely secondary to dehydration.   -Improved with hydration.   -Patient placed on gentle hydration 01/31/2022 due to malposition of cortrack tube. -Cortrack placed back and in position.  S/P CABG x 3 Stable and asymptomatic from cardiac standpoint.  Anemia  Anemia labs c/w aocd, iron def -Hemoglobin stable at 8.3. -Transfusion threshold hemoglobin < 7.    Dysphagia S/p PEG tube by VIR on 5/24, s/p replacement as above -Dietitian consulted for tube feeding  NPO for now  with g tube to gravity, consider small amounts Po for comfort. -The afternoon of 01/31/2022, concerned that tube feeds coming out of PEG tube site and concern for malposition of cortrack. -Abdominal films obtained and feeding tube tip noted at the position of the pylorus directed towards the duodenum. -Advanced cortrack 4 cm, 02/01/2022 with repeat abdominal films showing appropriate placement of cortrack tube.  -Resumed tube feeds. -IV fluids have been discontinued. -Dietitian following and appreciate their input and recommendations.  Urinary retention Foley catheter placed on 5/27 due to urinary retention at recent admission.  He has planned outpatient follow-up with urology. -Patient underwent successful voiding trial.  -Urine output 1050 cc over the past 24 hours. -Continue doxazosin. -Monitor urine output.  Diarrhea Daughter reported persistent diarrhea since the start of tube feed on 5/25.  -Tube feeds changed with some improvement with diarrhea. Negative GI pathogen panel Negative c diff  Abnormal LFTs Mildly elevated -LFTs trending down. -Follow-up.  Pressure ulcer Pressure Injury 01/24/22 Buttocks Deep Tissue Pressure Injury - Purple or maroon localized area of discolored intact skin or blood-filled blister due to damage of underlying soft tissue from pressure and/or shear. (Active)  01/24/22 2036  Location: Buttocks  Location Orientation:   Staging: Deep Tissue Pressure Injury - Purple or maroon localized area of discolored intact skin or blood-filled blister due  to damage of underlying soft tissue from pressure and/or shear.  Wound Description (Comments):   Present on Admission: Yes   Wound c/s   Goals of care, counseling/discussion Not currently interested in palliative care discussions, noted we can review pending course         DVT prophylaxis: Eliquis Code Status: Full. Family Communication: Updated patient and daughter and family at bedside. Disposition:  CIR versus SNF  Status is: Inpatient Remains inpatient appropriate because: Severity of illness   Consultants:  Neurosurgery: Dr. Ellene Route 01/28/2022 General surgery: Dr. Kieth Brightly 01/26/2022 Wound care RN   Procedures:  CT abdomen and pelvis 01/24/2022 Plain films of the cervical spine 01/27/2022 Fluoroscopic guided replacement of gastrostomy tube with upsizing 01/25/2022 interventional radiology 2D echo 01/27/2022    Antimicrobials:  Diflucan 01/25/2022>>>> 02/02/2022 IV Ancef 01/27/2022>>>>> 02/02/2022 IV Rocephin 01/27/2022 x 1 dose IV Merrem 01/24/2022>>>> 01/27/2022 IV vancomycin 01/24/2022>>>>> 01/27/2022    Subjective: Laying in bed.  Dressing changes being applied around G-tube.  No chest pain.  No shortness of breath.  Asking when he is going to be able to go home.  Daughter and family at bedside.   Objective: Vitals:   02/03/22 0611 02/03/22 0757 02/03/22 0837 02/03/22 1517  BP: 104/68  (!) 104/54 (!) 106/58  Pulse: 97 68 69 72  Resp: '18 16 17 17  '$ Temp: 98.1 F (36.7 C)  97.6 F (36.4 C) 97.6 F (36.4 C)  TempSrc: Axillary  Axillary Axillary  SpO2: 92% 100% 90% 98%  Weight:      Height:        Intake/Output Summary (Last 24 hours) at 02/03/2022 1848 Last data filed at 02/03/2022 1516 Gross per 24 hour  Intake 120 ml  Output 1750 ml  Net -1630 ml   Filed Weights   02/01/22 0500 02/02/22 0500 02/03/22 0500  Weight: 115.2 kg 115.2 kg 117 kg    Examination:  General exam: Alert.  NAD. Cortrack. Respiratory system: CTA B anterior lung fields.  No wheezes, no crackles, no rhonchi.  Fair air movement.  Speaking in full sentences.  Cardiovascular system: RRR no murmurs rubs or gallops.  No JVD.  No lower extremity edema. Gastrointestinal system: Abdomen is soft, nontender, nondistended, positive bowel sounds.  G-tube noted with dressing intact at insertion site.  Central nervous system: Alert and oriented. No focal neurological deficits. Extremities: Symmetric 5 x 5 power. Skin:  No rashes, lesions or ulcers Psychiatry: Judgement and insight appear normal. Mood & affect appropriate.     Data Reviewed:   CBC: Recent Labs  Lab 01/28/22 0146 01/29/22 0243 01/30/22 0503 01/31/22 0906 02/01/22 0152 02/02/22 0110 02/03/22 0725  WBC 5.9 4.7 5.8 7.1 7.9 7.6 7.6  NEUTROABS 3.3 2.8 3.2  --   --   --   --   HGB 7.7* 7.4* 8.3* 8.1* 8.2* 7.5* 8.3*  HCT 25.4* 24.5* 27.4* 25.8* 26.2* 24.3* 26.4*  MCV 100.0 100.8* 100.4* 100.8* 100.0 99.6 100.8*  PLT 113* 106* 108* 105* 103* 105* 96*    Basic Metabolic Panel: Recent Labs  Lab 01/29/22 0243 01/29/22 1635 01/30/22 0503 01/31/22 0906 02/01/22 0152 02/02/22 0110 02/03/22 0725  NA 144  --  143 139 140 139 137  K 3.3*  --  3.6 4.1 4.7 4.2 4.5  CL 108  --  108 105 107 109 107  CO2 26  --  '27 27 25 27 24  '$ GLUCOSE 145*  --  135* 141* 102* 136* 122*  BUN 31*  --  28* 27* 33* 36* 36*  CREATININE 0.93  --  0.85 0.73 0.76 0.72 0.72  CALCIUM 8.8*  --  8.5* 8.1* 8.4* 8.2* 8.2*  MG 2.1 2.0 2.1 2.2  --   --  2.1  PHOS 2.4* 2.5 2.8 2.7  --   --  2.4*    GFR: Estimated Creatinine Clearance: 86.3 mL/min (by C-G formula based on SCr of 0.72 mg/dL).  Liver Function Tests: Recent Labs  Lab 01/28/22 0433 01/29/22 0243 01/30/22 0503 01/31/22 0906 02/03/22 0725  AST 77* 51* 39  --   --   ALT 70* 31 14  --   --   ALKPHOS 119 111 105  --   --   BILITOT 0.6 0.5 0.7  --   --   PROT 4.7* 4.2* 4.4*  --   --   ALBUMIN 1.6* 1.7* 1.7* 1.6* 1.5*    CBG: Recent Labs  Lab 02/02/22 1814 02/03/22 0005 02/03/22 0605 02/03/22 1139 02/03/22 1803  GLUCAP 105* 122* 112* 115* 113*     Recent Results (from the past 240 hour(s))  Gastrointestinal Panel by PCR , Stool     Status: None   Collection Time: 01/24/22  9:08 PM   Specimen: Peg Site; Stool  Result Value Ref Range Status   Campylobacter species NOT DETECTED NOT DETECTED Final   Plesimonas shigelloides NOT DETECTED NOT DETECTED Final   Salmonella species NOT  DETECTED NOT DETECTED Final   Yersinia enterocolitica NOT DETECTED NOT DETECTED Final   Vibrio species NOT DETECTED NOT DETECTED Final   Vibrio cholerae NOT DETECTED NOT DETECTED Final   Enteroaggregative E coli (EAEC) NOT DETECTED NOT DETECTED Final   Enteropathogenic E coli (EPEC) NOT DETECTED NOT DETECTED Final   Enterotoxigenic E coli (ETEC) NOT DETECTED NOT DETECTED Final   Shiga like toxin producing E coli (STEC) NOT DETECTED NOT DETECTED Final   Shigella/Enteroinvasive E coli (EIEC) NOT DETECTED NOT DETECTED Final   Cryptosporidium NOT DETECTED NOT DETECTED Final   Cyclospora cayetanensis NOT DETECTED NOT DETECTED Final   Entamoeba histolytica NOT DETECTED NOT DETECTED Final   Giardia lamblia NOT DETECTED NOT DETECTED Final   Adenovirus F40/41 NOT DETECTED NOT DETECTED Final   Astrovirus NOT DETECTED NOT DETECTED Final   Norovirus GI/GII NOT DETECTED NOT DETECTED Final   Rotavirus A NOT DETECTED NOT DETECTED Final   Sapovirus (I, II, IV, and V) NOT DETECTED NOT DETECTED Final    Comment: Performed at [facility], [address] [place], [place] [address]  C Difficile Quick Screen w PCR reflex     Status: None   Collection Time: 01/24/22  9:08 PM   Specimen: Peg Site; Stool  Result Value Ref Range Status   C Diff antigen NEGATIVE NEGATIVE Final   C Diff toxin NEGATIVE NEGATIVE Final   C Diff interpretation No C. difficile detected.  Final    Comment: Performed at [facility], [place] [address] [place], [place] [address]  Aerobic Culture w Gram Stain (superficial specimen)     Status: None   Collection Time: 01/24/22 10:07 PM   Specimen: Wound  Result Value Ref Range Status   Specimen Description WOUND  Final   Special Requests PEG SITE  Final   Gram Stain   Final    NO SQUAMOUS EPITHELIAL CELLS SEEN FEW WBC SEEN FEW GRAM POSITIVE COCCI ABUNDANT GRAM NEGATIVE RODS Performed at [facility], [address], [place], [place] [address]    Culture  Final    MODERATE STAPHYLOCOCCUS EPIDERMIDIS MODERATE CANDIDA ALBICANS    Report Status 01/27/2022 FINAL  Final   Organism ID, Bacteria STAPHYLOCOCCUS EPIDERMIDIS  Final      Susceptibility   Staphylococcus epidermidis - MIC*    CIPROFLOXACIN >=8 RESISTANT Resistant     ERYTHROMYCIN <=0.25 SENSITIVE Sensitive     GENTAMICIN <=0.5 SENSITIVE Sensitive     OXACILLIN RESISTANT Resistant     TETRACYCLINE <=1 SENSITIVE Sensitive     VANCOMYCIN 2 SENSITIVE Sensitive     TRIMETH/SULFA 20 SENSITIVE Sensitive     CLINDAMYCIN >=8 RESISTANT Resistant     RIFAMPIN <=0.5 SENSITIVE Sensitive     Inducible Clindamycin NEGATIVE Sensitive     * MODERATE STAPHYLOCOCCUS EPIDERMIDIS         Radiology Studies: DG Abd Portable 1V  Result Date: 02/02/2022 CLINICAL DATA:  NG tube placement EXAM: PORTABLE ABDOMEN - 1 VIEW COMPARISON:  02/01/2022 FINDINGS: Nonobstructive pattern of bowel gas. Percutaneous gastrostomy tube projects with tip and balloon over the expected gastric body. Enteric feeding tube is positioned with tip in the transverse portion of the duodenum. No obvious free air. IMPRESSION: Enteric feeding tube tip in the transverse portion of the duodenum. Percutaneous gastrostomy tube projects with tip and balloon over the expected gastric body. Electronically Signed   By: Delanna Ahmadi M.D.   On: 02/02/2022 13:10        Scheduled Meds:  acetaminophen  1,000 mg Per Tube Q8H   amiodarone  200 mg Per Tube Daily   apixaban  5 mg Per Tube BID   Chlorhexidine Gluconate Cloth  6 each Topical Q0600   doxazosin  1 mg Per Tube Daily   famotidine  20 mg Per Tube BID   feeding supplement (JEVITY 1.5 CAL/FIBER)  1,000 mL Per Tube Q24H   feeding supplement (PROSource TF)  45 mL Per Tube Daily   fluconazole  200 mg Per Tube Daily   fluticasone furoate-vilanterol  1 puff Inhalation Daily   free water  120 mL Per Tube Q4H   leptospermum manuka honey  1 application  Topical BID   mirtazapine  7.5 mg  Per Tube QHS   nutrition supplement (JUVEN)  1 packet Per Tube BID BM   venlafaxine  18.75 mg Per Tube BID WC   Zinc Oxide   Topical BID   Continuous Infusions:     LOS: 10 days    Time spent: 35 minutes    Irine Seal, MD [facility]   To contact the attending provider between 7A-7P or the covering provider during after hours 7P-7A, please log into the web site [email or web address] and access using universal [place] password for that web site. If you do not have the password, please call the hospital operator.  02/03/2022, 6:48 PM

## 2022-02-04 ENCOUNTER — Ambulatory Visit: Payer: Medicare Other | Admitting: Cardiology

## 2022-02-04 ENCOUNTER — Inpatient Hospital Stay (HOSPITAL_COMMUNITY): Payer: Medicare Other

## 2022-02-04 DIAGNOSIS — L03311 Cellulitis of abdominal wall: Secondary | ICD-10-CM | POA: Diagnosis not present

## 2022-02-04 DIAGNOSIS — R319 Hematuria, unspecified: Secondary | ICD-10-CM | POA: Diagnosis not present

## 2022-02-04 DIAGNOSIS — D649 Anemia, unspecified: Secondary | ICD-10-CM | POA: Diagnosis not present

## 2022-02-04 DIAGNOSIS — L03818 Cellulitis of other sites: Secondary | ICD-10-CM | POA: Diagnosis not present

## 2022-02-04 DIAGNOSIS — R7989 Other specified abnormal findings of blood chemistry: Secondary | ICD-10-CM | POA: Diagnosis not present

## 2022-02-04 LAB — BASIC METABOLIC PANEL
Anion gap: 4 — ABNORMAL LOW (ref 5–15)
BUN: 38 mg/dL — ABNORMAL HIGH (ref 8–23)
CO2: 25 mmol/L (ref 22–32)
Calcium: 8.2 mg/dL — ABNORMAL LOW (ref 8.9–10.3)
Chloride: 107 mmol/L (ref 98–111)
Creatinine, Ser: 0.67 mg/dL (ref 0.61–1.24)
GFR, Estimated: >60 mL/min (ref >60)
Glucose, Bld: 129 mg/dL — ABNORMAL HIGH (ref 70–99)
Potassium: 4.6 mmol/L (ref 3.5–5.1)
Sodium: 136 mmol/L (ref 135–145)

## 2022-02-04 LAB — URINALYSIS, ROUTINE W REFLEX MICROSCOPIC

## 2022-02-04 LAB — CBC WITH DIFFERENTIAL/PLATELET
Abs Immature Granulocytes: 0.12 10*3/uL — ABNORMAL HIGH (ref 0.00–0.07)
Basophils Absolute: 0 10*3/uL (ref 0.0–0.1)
Basophils Relative: 0 %
Eosinophils Absolute: 0 10*3/uL (ref 0.0–0.5)
Eosinophils Relative: 0 %
HCT: 23.6 % — ABNORMAL LOW (ref 39.0–52.0)
Hemoglobin: 7.4 g/dL — ABNORMAL LOW (ref 13.0–17.0)
Immature Granulocytes: 2 %
Lymphocytes Relative: 22 %
Lymphs Abs: 1.4 10*3/uL (ref 0.7–4.0)
MCH: 31.2 pg (ref 26.0–34.0)
MCHC: 31.4 g/dL (ref 30.0–36.0)
MCV: 99.6 fL (ref 80.0–100.0)
Monocytes Absolute: 0.5 10*3/uL (ref 0.1–1.0)
Monocytes Relative: 7 %
Neutro Abs: 4.3 10*3/uL (ref 1.7–7.7)
Neutrophils Relative %: 69 %
Platelets: 104 10*3/uL — ABNORMAL LOW (ref 150–400)
RBC: 2.37 MIL/uL — ABNORMAL LOW (ref 4.22–5.81)
RDW: 19.9 % — ABNORMAL HIGH (ref 11.5–15.5)
WBC: 6.2 10*3/uL (ref 4.0–10.5)
nRBC: 0 % (ref 0.0–0.2)

## 2022-02-04 LAB — URINALYSIS, MICROSCOPIC (REFLEX)
Bacteria, UA: NONE SEEN
RBC / HPF: >50 RBC/hpf (ref 0–5)

## 2022-02-04 LAB — GLUCOSE, CAPILLARY
Glucose-Capillary: 125 mg/dL — ABNORMAL HIGH (ref 70–99)
Glucose-Capillary: 126 mg/dL — ABNORMAL HIGH (ref 70–99)
Glucose-Capillary: 141 mg/dL — ABNORMAL HIGH (ref 70–99)
Glucose-Capillary: 142 mg/dL — ABNORMAL HIGH (ref 70–99)

## 2022-02-04 NOTE — Progress Notes (Signed)
PROGRESS NOTE    [name]  UVO:[id number] DOB: 1932/11/09 DOA: 01/24/2022 PCP: [name], DO    Chief Complaint  Patient presents with   feeding tube issue     Brief Narrative:  [name] is a 88 y.o. male with medical history significant of paroxysmal atrial fibrillation, chronic systolic CHF, CAD s/p CABG, sick sinus syndrome s/p pacemaker, COPD, type 2 diabetes, hypertension, hyperlipidemia, chronic thrombocytopenia, recurrent UTI, OSA not on CPAP, dysphagia with PEG tube who presents with concerns of purulent discharge from feeding tube.  S/p upsizing of g tube on 6/3.  Surgery c/s and recommended keeping g tube to gravity to ensure no pressure of phlange on skin and balloon not pulled up against intraabdominal wall.  He currently has a cortrak for post pyloric tube feeds.  Difficult disposition at this point as he's going to need cortrak for nutrition while g tube is to gravity.    See below for additional details    Assessment & Plan:  Principal Problem:   Cellulitis Active Problems:   Hypotension   Paroxysmal atrial fibrillation (HCC)   HFrEF (heart failure with reduced ejection fraction) (HCC)   C6 cervical fracture (HCC)   Controlled type 2 diabetes mellitus without complication, without long-term current use of insulin (HCC)   Delirium   S/P CABG x 3   Hypernatremia   Dysphagia   Anemia   Urinary retention   Diarrhea   Abnormal LFTs   Pressure ulcer   Goals of care, counseling/discussion   Status post insertion of percutaneous endoscopic gastrostomy (PEG) tube ([place])   Hematuria    Assessment and Plan: * Cellulitis Pt has dysphagia and is s/p PEG tube placement on 5/24.  Presents with malodor and purulent discharge from PEG tube site. CT abd/pelvis showed 6.4x2.2 cm area of inflammatory/infectious phlegmon in subcutaneous plaine at site of placement of gastrostomy, pockets of air in the subcutaneous plane at the site of g tube placement (due  to open wound in skin or suggest infection with gas producing organisms).  No definite demonstrable thick walled loculated fluid collection. S/p fluoroscopic guided replacement and up sizing of now 24 fr ballon retention gastrostomy tube 6/3 per Dr. Pascal Lux, suspected pressure ulcer about medial aspect of g tube insertion - likely due to insertion angle of tube and external retention disc being too tight Appreciate surgery recommedations  - g tube to gravity  - local wound care (zinc oxide to intact portion of epidermis, dressing changes around g tube 4x daily)  - post pyloric nutrition  - wound care  - available as needed, IR can follow up prn as well Issue at this time is g tube is to gravity and he has cortrak while we're allowing wound to heal.  It's not clear how long he'll need g tube to gravity and post pyloric feeding at this point.  -narrow abx, will plan short course of ancef given overall improvement (he's tolerated cephalosporins and augmentin in past per pharmacy) -Wound cultures are pending (moderate staph epi, moderate candida albicans - not sure how clinically relevant these are - will follow) -blood cultures negative. -Wound care consulted, appreciate recs - medi honey added -On 01/29/2022 in the evening it was noted per RN and daughter that wound had looked worse and crossed, came and assessed the patient and IV antibiotics broadened back to vancomycin and Merrem. -Patient reassessed by general surgery again 01/30/2022 and stating wound from a pressure necrosis from his G-tube, recommending continued  current wound care, de-escalation of antibiotics, and if wound is not shrinking over the next 1 to 2 weeks other considerations will need to be entertained at that time.  General surgery recommending continuing current wound care, postpyloric feeds. - IV antibiotics have been narrowed back to Ancef to complete a 10-day course of antibiotic treatment. -Appreciate general surgery's input and  recommendations.  Hypotension Presented with hypotension with SBP of 90 over 50s.  This has improved with fluid but remains soft.  Previously on Lasix, Entresto, Aldactone and beta-blockers at home but these were hold during last admission.   -Continue to hold hypertensive medications secondary to low blood pressure. -Saline lock IV fluids.  -repeat echo -> EF 40-45% - BP overall improved, follow  C6 cervical fracture ([place]) This is from a ground-level fall back in April.  Was on a soft cervical collar and continue follow-up with neurosurgery. Discussed with Dr. Ellene Route, he notes ok to d/c c collar Flex ex films  with known subacute fx through anterior inferior aspect of c 6 vertebral body, anterior osteophyte not well seen, C6-7 space normally aligned without abnormal motion on extension, not well assessed on flexion (see report) -Patient seen by neurosurgery who states C6 fracture is healed and patient can be removed from collar and pursue activities as tolerated.  HFrEF (heart failure with reduced ejection fraction) ([place]) Appears euvolemic on exam. Echo as above,  Paroxysmal atrial fibrillation (HCC) -Currently rate controlled.   -Continue amiodarone. -Eliquis for anticoagulation.  Delirium Delirium precautions -Improved.  Controlled type 2 diabetes mellitus without complication, without long-term current use of insulin (HCC) Controlled.  Last A1c 5.2% on 5/15. -CBG 141 this morning.   -Follow.  Hypernatremia - Likely secondary to dehydration.   -Improved with hydration.   -Patient placed on gentle hydration 01/31/2022 due to malposition of cortrack tube. -Cortrack placed back and in position. -IV fluids discontinued.  S/P CABG x 3 Stable and asymptomatic from cardiac standpoint.  Anemia  Anemia labs c/w aocd, iron def -Patient with hematuria this morning. -CBC with hemoglobin at 7.4. -Transfusion threshold hemoglobin < 7.    Dysphagia S/p PEG tube by VIR on 5/24,  s/p replacement as above -Dietitian consulted for tube feeding  NPO for now with g tube to gravity, consider small amounts Po for comfort. -The afternoon of 01/31/2022, concerned that tube feeds coming out of PEG tube site and concern for malposition of cortrack. -Abdominal films obtained and feeding tube tip noted at the position of the pylorus directed towards the duodenum. -Advanced cortrack 4 cm, 02/01/2022 with repeat abdominal films showing appropriate placement of cortrack tube.  -Resumed tube feeds. -IV fluids have been discontinued. -Dietitian following and appreciate their input and recommendations.  Urinary retention Foley catheter placed on 5/27 due to urinary retention at recent admission.  He has planned outpatient follow-up with urology. -Patient underwent successful voiding trial.  -Urine output 1450 cc over the past 24 hours. -Continue doxazosin. -Monitor urine output.  Diarrhea Daughter reported persistent diarrhea since the start of tube feed on 5/25.  -Tube feeds changed with some improvement with diarrhea. Negative GI pathogen panel Negative c diff  Abnormal LFTs Mildly elevated -LFTs trending down. -Follow-up.  Pressure ulcer Pressure Injury 01/24/22 Buttocks Deep Tissue Pressure Injury - Purple or maroon localized area of discolored intact skin or blood-filled blister due to damage of underlying soft tissue from pressure and/or shear. (Active)  01/24/22 2036  Location: Buttocks  Location Orientation:   Staging: Deep Tissue Pressure Injury -  Purple or maroon localized area of discolored intact skin or blood-filled blister due to damage of underlying soft tissue from pressure and/or shear.  Wound Description (Comments):   Present on Admission: Yes   Wound c/s   Goals of care, counseling/discussion Not currently interested in palliative care discussions, noted we can review pending course  Hematuria - Patient with hematuria noted today. -Patient denies  any abdominal pain, no dysuria, no nausea, no vomiting, no CVA tenderness, no back pain. -Patient with good urine output. -Per daughter patient with prior history of nephrolithiasis. -Follow CBC. -Check a renal ultrasound. -If no improvement with hematuria in the next 24 hours we will place a Foley catheter, irrigation, consult with urology for further evaluation. -Supportive care.         DVT prophylaxis: Eliquis Code Status: Full. Family Communication: Updated patient and daughter and family at bedside. Disposition: CIR versus SNF  Status is: Inpatient Remains inpatient appropriate because: Severity of illness   Consultants:  Neurosurgery: Dr. Ellene Route 01/28/2022 General surgery: Dr. Kieth Brightly 01/26/2022 Wound care RN   Procedures:  CT abdomen and pelvis 01/24/2022 Plain films of the cervical spine 01/27/2022 Fluoroscopic guided replacement of gastrostomy tube with upsizing 01/25/2022 interventional radiology 2D echo 01/27/2022    Antimicrobials:  Diflucan 01/25/2022>>>> 02/02/2022 IV Ancef 01/27/2022>>>>> 02/02/2022 IV Rocephin 01/27/2022 x 1 dose IV Merrem 01/24/2022>>>> 01/27/2022 IV vancomycin 01/24/2022>>>>> 01/27/2022    Subjective: Patient noted with concerns for hematuria.  Patient denies any fevers, no chills, no dysuria, no abdominal pain, no CVA tenderness.  Daughter at bedside states patient with history of nephrolithiasis.  Patient afebrile.  .   Objective: Vitals:   02/03/22 1517 02/03/22 2005 02/04/22 0536 02/04/22 0741  BP: (!) 106/58 110/75 (!) 112/56 (!) 121/57  Pulse: 72 66 66 64  Resp: '17 17  18  '$ Temp: 97.6 F (36.4 C) 97.7 F (36.5 C) 97.9 F (36.6 C) 97.6 F (36.4 C)  TempSrc: Axillary Axillary Oral Oral  SpO2: 98% 98% 95% 99%  Weight:    115.2 kg  Height:        Intake/Output Summary (Last 24 hours) at 02/04/2022 1710 Last data filed at 02/04/2022 0600 Gross per 24 hour  Intake --  Output 1300 ml  Net -1300 ml   Filed Weights   02/02/22 0500 02/03/22  0500 02/04/22 0741  Weight: 115.2 kg 117 kg 115.2 kg    Examination:  General exam: Alert.  NAD. Cortrack. Respiratory system: Clear to auscultation bilaterally anterior lung fields.  No wheezes, no crackles, no rhonchi.  Fair air movement.  Speaking in full sentences.   Cardiovascular system: Regular rate rhythm no murmurs rubs or gallops.  No JVD.  No lower extremity edema. Gastrointestinal system: Abdomen is soft, nontender, nondistended, positive bowel sounds.  G-tube noted with dressing intact at insertion site.  Central nervous system: Alert and oriented. No focal neurological deficits. Extremities: Symmetric 5 x 5 power. Skin: No rashes, lesions or ulcers Psychiatry: Judgement and insight appear normal. Mood & affect appropriate.     Data Reviewed:   CBC: Recent Labs  Lab 01/29/22 0243 01/30/22 0503 01/31/22 0906 02/01/22 0152 02/02/22 0110 02/03/22 0725 02/04/22 1138  WBC 4.7 5.8 7.1 7.9 7.6 7.6 6.2  NEUTROABS 2.8 3.2  --   --   --   --  4.3  HGB 7.4* 8.3* 8.1* 8.2* 7.5* 8.3* 7.4*  HCT 24.5* 27.4* 25.8* 26.2* 24.3* 26.4* 23.6*  MCV 100.8* 100.4* 100.8* 100.0 99.6 100.8* 99.6  PLT 106* 108*  105* 103* 105* 96* 104*    Basic Metabolic Panel: Recent Labs  Lab 01/29/22 0243 01/29/22 1635 01/30/22 0503 01/31/22 0906 02/01/22 0152 02/02/22 0110 02/03/22 0725 02/04/22 1206  NA 144  --  143 139 140 139 137 136  K 3.3*  --  3.6 4.1 4.7 4.2 4.5 4.6  CL 108  --  108 105 107 109 107 107  CO2 26  --  '27 27 25 27 24 25  '$ GLUCOSE 145*  --  135* 141* 102* 136* 122* 129*  BUN 31*  --  28* 27* 33* 36* 36* 38*  CREATININE 0.93  --  0.85 0.73 0.76 0.72 0.72 0.67  CALCIUM 8.8*  --  8.5* 8.1* 8.4* 8.2* 8.2* 8.2*  MG 2.1 2.0 2.1 2.2  --   --  2.1  --   PHOS 2.4* 2.5 2.8 2.7  --   --  2.4*  --     GFR: Estimated Creatinine Clearance: 85.6 mL/min (by C-G formula based on SCr of 0.67 mg/dL).  Liver Function Tests: Recent Labs  Lab 01/29/22 0243 01/30/22 0503  01/31/22 0906 02/03/22 0725  AST 51* 39  --   --   ALT 31 14  --   --   ALKPHOS 111 105  --   --   BILITOT 0.5 0.7  --   --   PROT 4.2* 4.4*  --   --   ALBUMIN 1.7* 1.7* 1.6* 1.5*    CBG: Recent Labs  Lab 02/03/22 1139 02/03/22 1803 02/04/22 0003 02/04/22 0611 02/04/22 1206  GLUCAP 115* 113* 142* 141* 125*     No results found for this or any previous visit (from the past 240 hour(s)).        Radiology Studies: No results found.      Scheduled Meds:  acetaminophen  1,000 mg Per Tube Q8H   amiodarone  200 mg Per Tube Daily   apixaban  5 mg Per Tube BID   Chlorhexidine Gluconate Cloth  6 each Topical Q0600   doxazosin  1 mg Per Tube Daily   famotidine  20 mg Per Tube BID   feeding supplement (JEVITY 1.5 CAL/FIBER)  1,000 mL Per Tube Q24H   feeding supplement (PROSource TF)  45 mL Per Tube Daily   fluticasone furoate-vilanterol  1 puff Inhalation Daily   free water  120 mL Per Tube Q4H   leptospermum manuka honey  1 application  Topical BID   mirtazapine  7.5 mg Per Tube QHS   nutrition supplement (JUVEN)  1 packet Per Tube BID BM   venlafaxine  18.75 mg Per Tube BID WC   Zinc Oxide   Topical BID   Continuous Infusions:     LOS: 11 days    Time spent: 35 minutes    Irine Seal, MD [facility]   To contact the attending provider between 7A-7P or the covering provider during after hours 7P-7A, please log into the web site [email or web address] and access using universal [place] password for that web site. If you do not have the password, please call the hospital operator.  02/04/2022, 5:10 PM

## 2022-02-04 NOTE — Progress Notes (Signed)
Occupational Therapy Treatment Patient Details Name: [name] MRN: [id number] DOB: 1933/08/17 Today's Date: 02/04/2022   History of present illness [name] is a 88 y.o. male who presented with purulent discharge from feeding tube. Due to likely pressure wound related to G tube, IR upsized G tube on 6/3 and cortrak was placed for temporary nutrition alternative. PMH: atrial fibrillation, chronic systolic CHF, CAD s/p CABG, sick sinus syndrome s/p pacemaker, COPD, type 2 diabetes, hypertension, hyperlipidemia, chronic thrombocytopenia, recurrent UTI, OSA not on CPAP, dysphagia with PEG tube , hx of C6 fx   OT comments  Patient received in supine and agreeable to OT session. Patient performed rolling in bed to change pads and to perform log rolling technique with max assist to get to EOB. Patient performed 3 stands from EOB with use of pads and max assist of 2. Patient returned to supine with max assist. Patient to continue with acute OT.    Recommendations for follow up therapy are one component of a multi-disciplinary discharge planning process, led by the attending physician.  Recommendations may be updated based on patient status, additional functional criteria and insurance authorization.    Follow Up Recommendations  Acute inpatient rehab (3hours/day)    Assistance Recommended at Discharge Frequent or constant Supervision/Assistance  Patient can return home with the following  Two people to help with walking and/or transfers;A lot of help with bathing/dressing/bathroom;Direct supervision/assist for medications management;Direct supervision/assist for financial management;Assistance with feeding;Assist for transportation;Help with stairs or ramp for entrance   Equipment Recommendations   (air mattress overlay)    Recommendations for Other Services      Precautions / Restrictions Precautions Precautions: Fall;Other (comment) Precaution Booklet Issued: No Precaution Comments:  G tube draining to gravity (ok to mobilize with therapy per MD); soft collar discontinued Restrictions Weight Bearing Restrictions: No RLE Weight Bearing: Weight bearing as tolerated LLE Weight Bearing: Weight bearing as tolerated       Mobility Bed Mobility Overal bed mobility: Needs Assistance Bed Mobility: Sit to Supine, Sidelying to Sit Rolling: Mod assist Sidelying to sit: Max assist   Sit to supine: Max assist   General bed mobility comments: instructed on log rolling technque with patient attempting to push up to sitting but required max assist to raise trunk    Transfers Overall transfer level: Needs assistance Equipment used: 2 person hand held assist Transfers: Sit to/from Stand Sit to Stand: Max assist, +2 physical assistance, From elevated surface           General transfer comment: patient performed 3 stands from EOB with use of bed pads to raise hips     Balance Overall balance assessment: Needs assistance Sitting-balance support: No upper extremity supported, Feet supported, Single extremity supported Sitting balance-Leahy Scale: Poor Sitting balance - Comments: reliant on UE support and min guard to min assist for balance Postural control: Right lateral lean, Posterior lean Standing balance support: Bilateral upper extremity supported, Reliant on assistive device for balance Standing balance-Leahy Scale: Zero Standing balance comment: able to clear buttocks off bed with use of bed pads and max assist of 2                           ADL either performed or assessed with clinical judgement   ADL  Extremity/Trunk Assessment Upper Extremity Assessment RUE Deficits / Details: crepitation felt in shoulder with PROM up to 85*. difficulty lifting UE to command. grasp 4/5 RUE Coordination: decreased fine motor;decreased gross motor LUE Deficits / Details: initially reliant on L UE  support sitting EOB. However, once back in bed and attempting to assess strength/ROM, pt unable to lift UE to command LUE Coordination: decreased fine motor;decreased gross motor            Vision       Perception     Praxis      Cognition Arousal/Alertness: Awake/alert Behavior During Therapy: Flat affect, Restless, Anxious Overall Cognitive Status: Impaired/Different from baseline Area of Impairment: Orientation, Attention, Memory, Following commands, Safety/judgement, Awareness, Problem solving                 Orientation Level: Disoriented to, Time, Situation Current Attention Level: Sustained Memory: Decreased recall of precautions, Decreased short-term memory Following Commands: Follows one step commands with increased time Safety/Judgement: Decreased awareness of safety, Decreased awareness of deficits Awareness: Intellectual Problem Solving: Slow processing, Decreased initiation, Requires verbal cues, Requires tactile cues General Comments: more alert today and following one step commands        Exercises      Shoulder Instructions       General Comments daughter present and assisted with patient care    Pertinent Vitals/ Pain       Pain Assessment Pain Assessment: Faces Faces Pain Scale: Hurts little more Pain Location: back and bottom with bed mobility Pain Descriptors / Indicators: Moaning Pain Intervention(s): Monitored during session, Limited activity within patient's tolerance  Home Living                                          Prior Functioning/Environment              Frequency  Min 2X/week        Progress Toward Goals  OT Goals(current goals can now be found in the care plan section)  Progress towards OT goals: Progressing toward goals  Acute Rehab OT Goals Patient Stated Goal: get stronger OT Goal Formulation: With patient/family Time For Goal Achievement: 02/12/22 Potential to Achieve Goals:  Fair ADL Goals Pt Will Perform Eating: with modified independence;with adaptive utensils;sitting Pt Will Perform Grooming: with set-up;sitting Pt Will Perform Lower Body Bathing: with set-up;sitting/lateral leans;sit to/from stand;with adaptive equipment Pt Will Perform Upper Body Dressing: with min assist;sitting Pt Will Perform Lower Body Dressing: with mod assist;sitting/lateral leans;with adaptive equipment Pt Will Transfer to Toilet: with min assist;stand pivot transfer;bedside commode Pt Will Perform Toileting - Clothing Manipulation and hygiene: with max assist;sit to/from stand Pt/caregiver will Perform Home Exercise Program: Increased strength;Both right and left upper extremity;With theraband;With minimal assist;With written HEP provided Additional ADL Goal #1: Pt to complete bed mobility at Min A in prep for ADL transfers  Plan Discharge plan remains appropriate;Discharge plan needs to be updated;Frequency needs to be updated    Co-evaluation                 AM-PAC OT "6 Clicks" Daily Activity     Outcome Measure   Help from another person eating meals?: Total Help from another person taking care of personal grooming?: A Lot Help from another person toileting, which includes using toliet, bedpan, or urinal?: Total Help from another person bathing (including washing, rinsing, drying)?: A  Lot Help from another person to put on and taking off regular upper body clothing?: A Lot Help from another person to put on and taking off regular lower body clothing?: Total 6 Click Score: 9    End of Session    OT Visit Diagnosis: Unsteadiness on feet (R26.81);Other abnormalities of gait and mobility (R26.89);Muscle weakness (generalized) (M62.81);History of falling (Z91.81);Feeding difficulties (R63.3);Other symptoms and signs involving cognitive function;Adult, failure to thrive (R62.7);Pain   Activity Tolerance     Patient Left     Nurse Communication          Time:  1250-1306 OT Time Calculation (min): 16 min  Charges: OT General Charges $OT Visit: 1 Visit OT Treatments $Therapeutic Activity: 8-22 mins  Lodema Hong, OTA Acute Rehabilitation Services  Pager [phone] Office [phone]   Trixie Dredge 02/04/2022, 1:18 PM

## 2022-02-04 NOTE — Assessment & Plan Note (Addendum)
-   Patient with hematuria noted since 02/04/2022. -Patient with history of prostate cancer status postradiation under the care of Dr. Annabell Howells. -Patient denies any abdominal pain, no dysuria, no nausea, no vomiting, no CVA tenderness. -Per daughter patient with prior history of nephrolithiasis. -Renal ultrasound with no hydronephrosis or shadowing stone, bilateral renal cyst, minimal bladder debris.   -Urine cultures positive for VRE -Foley catheter initially placed with irrigation however with no significant improvement with hematuria and as such Foley catheter discontinued per urology recommendations. -Patient seen in consultation by urology, Dr. Annabell Howells on 02/10/2022 who recommended removal of Foley catheter, treatment of UTI and monitoring hematuria off Eliquis for 1 to 2 days and if no significant improvement will likely need cystoscopy and possible fulguration.   -Patient now with intermittent hematuria which is improving daily. -Continue treatment of VRE with Zyvox which has been transitioned to Zyvox per tube 02/16/2022 and will treat for total of 10 days.. -Supportive care. -Monitor closely with resumption of Eliquis.

## 2022-02-04 NOTE — Progress Notes (Addendum)
Patient's urine in canister red. MD made aware.

## 2022-02-05 DIAGNOSIS — L03311 Cellulitis of abdominal wall: Secondary | ICD-10-CM | POA: Diagnosis not present

## 2022-02-05 DIAGNOSIS — L03818 Cellulitis of other sites: Secondary | ICD-10-CM | POA: Diagnosis not present

## 2022-02-05 DIAGNOSIS — R7989 Other specified abnormal findings of blood chemistry: Secondary | ICD-10-CM | POA: Diagnosis not present

## 2022-02-05 DIAGNOSIS — D649 Anemia, unspecified: Secondary | ICD-10-CM | POA: Diagnosis not present

## 2022-02-05 LAB — BASIC METABOLIC PANEL
Anion gap: 4 — ABNORMAL LOW (ref 5–15)
BUN: 31 mg/dL — ABNORMAL HIGH (ref 8–23)
CO2: 25 mmol/L (ref 22–32)
Calcium: 8.3 mg/dL — ABNORMAL LOW (ref 8.9–10.3)
Chloride: 106 mmol/L (ref 98–111)
Creatinine, Ser: 0.67 mg/dL (ref 0.61–1.24)
GFR, Estimated: >60 mL/min (ref >60)
Glucose, Bld: 140 mg/dL — ABNORMAL HIGH (ref 70–99)
Potassium: 4.3 mmol/L (ref 3.5–5.1)
Sodium: 135 mmol/L (ref 135–145)

## 2022-02-05 LAB — CBC WITH DIFFERENTIAL/PLATELET
Abs Immature Granulocytes: 0.12 10*3/uL — ABNORMAL HIGH (ref 0.00–0.07)
Basophils Absolute: 0 10*3/uL (ref 0.0–0.1)
Basophils Relative: 0 %
Eosinophils Absolute: 0 10*3/uL (ref 0.0–0.5)
Eosinophils Relative: 0 %
HCT: 23.7 % — ABNORMAL LOW (ref 39.0–52.0)
Hemoglobin: 7.4 g/dL — ABNORMAL LOW (ref 13.0–17.0)
Immature Granulocytes: 2 %
Lymphocytes Relative: 20 %
Lymphs Abs: 1.4 10*3/uL (ref 0.7–4.0)
MCH: 30.8 pg (ref 26.0–34.0)
MCHC: 31.2 g/dL (ref 30.0–36.0)
MCV: 98.8 fL (ref 80.0–100.0)
Monocytes Absolute: 0.7 10*3/uL (ref 0.1–1.0)
Monocytes Relative: 11 %
Neutro Abs: 4.6 10*3/uL (ref 1.7–7.7)
Neutrophils Relative %: 67 %
Platelets: 114 10*3/uL — ABNORMAL LOW (ref 150–400)
RBC: 2.4 MIL/uL — ABNORMAL LOW (ref 4.22–5.81)
RDW: 19.8 % — ABNORMAL HIGH (ref 11.5–15.5)
WBC: 6.9 10*3/uL (ref 4.0–10.5)
nRBC: 0 % (ref 0.0–0.2)

## 2022-02-05 LAB — GLUCOSE, CAPILLARY
Glucose-Capillary: 117 mg/dL — ABNORMAL HIGH (ref 70–99)
Glucose-Capillary: 120 mg/dL — ABNORMAL HIGH (ref 70–99)
Glucose-Capillary: 121 mg/dL — ABNORMAL HIGH (ref 70–99)
Glucose-Capillary: 131 mg/dL — ABNORMAL HIGH (ref 70–99)
Glucose-Capillary: 141 mg/dL — ABNORMAL HIGH (ref 70–99)

## 2022-02-05 NOTE — Progress Notes (Signed)
Physical Therapy Treatment Patient Details Name: [name] MRN: [id number] DOB: 02/23/1933 Today's Date: 02/05/2022   History of Present Illness [name] is a 88 y.o. male who presented with purulent discharge from feeding tube. Due to likely pressure wound related to G tube, IR upsized G tube on 6/3 and cortrak was placed for temporary nutrition alternative. PMH: atrial fibrillation, chronic systolic CHF, CAD s/p CABG, sick sinus syndrome s/p pacemaker, COPD, type 2 diabetes, hypertension, hyperlipidemia, chronic thrombocytopenia, recurrent UTI, OSA not on CPAP, dysphagia with PEG tube , hx of C6 fx    PT Comments    Pt admitted with above diagnosis. Pt was able to stand 2 x with use of Stedy however could not sustain standing well enough or long enough to get the buttock pads under him.  After 2 stands, pt stating he "has to lie down" and was unable to get pt to do more than exercises at that point. Pt fatigues quickly.  Family continues to be very supportive.   Pt currently with functional limitations due to balance and endurance deficits. Pt will benefit from skilled PT to increase their independence and safety with mobility to allow discharge to the venue listed below.      Recommendations for follow up therapy are one component of a multi-disciplinary discharge planning process, led by the attending physician.  Recommendations may be updated based on patient status, additional functional criteria and insurance authorization.  Follow Up Recommendations  Acute inpatient rehab (3hours/day) (family electing to D/C home)     Assistance Recommended at Discharge Frequent or constant Supervision/Assistance  Patient can return home with the following Two people to help with walking and/or transfers;Two people to help with bathing/dressing/bathroom;Assistance with cooking/housework;Assistance with feeding;Direct supervision/assist for medications management;Assist for transportation;Direct  supervision/assist for financial management;Help with stairs or ramp for entrance   Equipment Recommendations  Wheelchair (measurements PT);Other (comment);Wheelchair cushion (measurements PT) (hoyer lift, air mattress overlay for bed)    Recommendations for Other Services Rehab consult (family requesting)     Precautions / Restrictions Precautions Precautions: Fall;Other (comment) Precaution Booklet Issued: No Precaution Comments: G tube draining to gravity (ok to mobilize with therapy per MD); soft collar discontinued Restrictions Weight Bearing Restrictions: No RLE Weight Bearing: Weight bearing as tolerated LLE Weight Bearing: Weight bearing as tolerated     Mobility  Bed Mobility Overal bed mobility: Needs Assistance Bed Mobility: Sit to Supine, Sidelying to Sit Rolling: Mod assist Sidelying to sit: Max assist Supine to sit: Max assist, +2 for physical assistance, +2 for safety/equipment Sit to supine: Max assist Sit to sidelying: Max assist General bed mobility comments: instructed on log rolling technque with patient attempting to push up to sitting but required max assist to raise trunk    Transfers Overall transfer level: Needs assistance   Transfers: Sit to/from Stand, Bed to chair/wheelchair/BSC Sit to Stand: Max assist, +2 physical assistance, From elevated surface, Mod assist           General transfer comment: patient performed 2 stands to [facility] from EOB with use of bed pads to raise hipswith first stand pt able to stand fairly upright but could not sustain standing for more than a few seconds.  Second stand pt did clear buttocks off bed 1/2 way up but could not sustain and sat back down. Pt then leaned to his right and declined to stand again, asking to lie down as he laid on his right side.  Assisted back to supine.  Pt  did not stand enough to put the Stedy buttock pads under him to move him to chair and PT was also concerned pt may not tolerate standing  again. Transfer via Lift Equipment: Stedy  Ambulation/Gait               General Gait Details: unable today   Marine scientist [place] (Stroke Patients Only)       Balance Overall balance assessment: Needs assistance Sitting-balance support: No upper extremity supported, Feet supported, Single extremity supported Sitting balance-Leahy Scale: Poor Sitting balance - Comments: reliant on UE support and min guard to min assist for balance Postural control: Right lateral lean, Posterior lean Standing balance support: Bilateral upper extremity supported, Reliant on assistive device for balance Standing balance-Leahy Scale: Zero Standing balance comment: able to clear buttocks off bed with use of bed pads and max assist of 2                            Cognition Arousal/Alertness: Awake/alert Behavior During Therapy: Flat affect, Restless, Anxious Overall Cognitive Status: Impaired/Different from baseline Area of Impairment: Orientation, Attention, Memory, Following commands, Safety/judgement, Awareness, Problem solving                 Orientation Level: Disoriented to, Time, Situation Current Attention Level: Sustained Memory: Decreased recall of precautions, Decreased short-term memory Following Commands: Follows one step commands with increased time Safety/Judgement: Decreased awareness of safety, Decreased awareness of deficits Awareness: Intellectual Problem Solving: Slow processing, Decreased initiation, Requires verbal cues, Requires tactile cues          Exercises General Exercises - Upper Extremity Shoulder Flexion: AAROM, Both, 10 reps, Supine General Exercises - Lower Extremity Ankle Circles/Pumps: AROM, Both, 10 reps Short Arc Quad: AROM, Both, 10 reps Heel Slides: AAROM, Both, Supine, 10 reps    General Comments        Pertinent Vitals/Pain Pain Assessment Pain Assessment: Faces Faces Pain  Scale: Hurts little more Breathing: normal Negative Vocalization: none Facial Expression: smiling or inexpressive Body Language: relaxed Consolability: no need to console PAINAD Score: 0 Pain Location: back and bottom with bed mobility Pain Descriptors / Indicators: Moaning Pain Intervention(s): Limited activity within patient's tolerance, Monitored during session, Repositioned    Home Living                          Prior Function            PT Goals (current goals can now be found in the care plan section) Acute Rehab PT Goals Patient Stated Goal: to eventually return home Progress towards PT goals: Progressing toward goals    Frequency    Min 3X/week      PT Plan Current plan remains appropriate    Co-evaluation              AM-PAC PT "6 Clicks" Mobility   Outcome Measure  Help needed turning from your back to your side while in a flat bed without using bedrails?: Total Help needed moving from lying on your back to sitting on the side of a flat bed without using bedrails?: Total Help needed moving to and from a bed to a chair (including a wheelchair)?: Total Help needed standing up from a chair using your arms (e.g., wheelchair or bedside chair)?: Total Help needed to walk  in hospital room?: Total Help needed climbing 3-5 steps with a railing? : Total 6 Click Score: 6    End of Session   Activity Tolerance: Patient limited by pain;Patient limited by fatigue Patient left: in bed;with call bell/phone within reach;with family/visitor present Nurse Communication: Mobility status;Need for lift equipment, skin tear on left elbow - dressing applied PT Visit Diagnosis: History of falling (Z91.81);Pain;Muscle weakness (generalized) (M62.81);Unsteadiness on feet (R26.81) Pain - Right/Left: Right Pain - part of body: Hip     Time: 1240-1313 PT Time Calculation (min) (ACUTE ONLY): 33 min  Charges:  $Therapeutic Exercise: 8-22 mins $Therapeutic  Activity: 8-22 mins                     [facility] M,PT Acute Rehab Services [place] 02/05/2022, 1:50 PM

## 2022-02-05 NOTE — Progress Notes (Signed)
Inpatient Rehab Admissions Coordinator:   Per [facility] and family request pt was re-screened by Shann Medal, PT, DPT, for possible CIR candidacy.  Chart reviewed and pt is not a candidate for CIR.  His [employer] Medicare will not approve hospital level rehab for his diagnosis, nor does he demonstrate the tolerance for intensive rehab, or the ability to make large functional gains in a short period of time (both of which are required by Medicare guidelines for CIR admission).  On review of therapy documentation, he was performing bed mobility and transfers with max to total assist +2 on evaluation on 6/7, and continues to require max to total assist +2 for bed mobility and transfers on most recent PT/OT notes.  Unfortunately, even if he did demonstrate significant progress over the next few days with therapy, insurance approval would still be a barrier to admission based on his admitting diagnosis.    Shann Medal, PT, DPT Admissions Coordinator [phone] 02/05/22  10:14 AM

## 2022-02-05 NOTE — TOC Progression Note (Addendum)
Transition of Care [facility]) - Progression Note    Patient Details  Name: [name] MRN: [id number] Date of Birth: [birth year]  Transition of Care [facility] CM/SW [place], [place] Phone Number: 02/05/2022, 9:46 AM  Clinical Narrative:     MD notified CSW that pt is doing much better and that dtr would be interested in CIR placement. CSW reached out to [facility] w/ CIR admissions to request they review pt again for possible admission. TOC will continue to follow for DC needs.  10AM CIR notified CSW that pt is not a candidate at this time due to his diagnosis and not being able to tolerate intensity. MD notified.  Expected Discharge Plan: [place] Barriers to Discharge: Continued Medical Work up  Expected Discharge Plan and Services Expected Discharge Plan: [place]   Discharge Planning Services: CM Consult Post Acute Care Choice: [place] arrangements for the past 2 months: Single Family Home                 DME Arranged: Tube feeding DME Agency: [employer] Date DME Agency Contacted: 01/27/22 Time DME Agency Contacted: [id number] Representative spoke with at DME Agency: Letta Kocher await new orders HH Arranged: RN, PT [place] Agency: [place] Date [place]: 01/27/22 Time [place]: [place] Representative spoke with at [place]: [place] ([place]) Interventions    Readmission Risk Interventions    01/16/2022    2:12 PM  Readmission Risk Prevention Plan  Transportation Screening Complete  Medication Review Press photographer) Referral to Pharmacy  PCP or Specialist appointment within 3-5 days of discharge Complete  [place] or [place] Complete  SW Recovery Care/Counseling Consult Complete  [place] Not Applicable

## 2022-02-05 NOTE — Plan of Care (Signed)
  Problem: Education: Goal: Knowledge of General Education information will improve Description Including pain rating scale, medication(s)/side effects and non-pharmacologic comfort measures Outcome: Progressing   Problem: Health Behavior/Discharge Planning: Goal: Ability to manage health-related needs will improve Outcome: Progressing   

## 2022-02-05 NOTE — Progress Notes (Signed)
PROGRESS NOTE    [name]  HEN:[id number] DOB: [birth year] DOA: 01/24/2022 PCP: [name], DO    Chief Complaint  Patient presents with   feeding tube issue     Brief Narrative:  [name] is a 88 y.o. male with medical history significant of paroxysmal atrial fibrillation, chronic systolic CHF, CAD s/p CABG, sick sinus syndrome s/p pacemaker, COPD, type 2 diabetes, hypertension, hyperlipidemia, chronic thrombocytopenia, recurrent UTI, OSA not on CPAP, dysphagia with PEG tube who presents with concerns of purulent discharge from feeding tube.  S/p upsizing of g tube on 6/3.  Surgery c/s and recommended keeping g tube to gravity to ensure no pressure of phlange on skin and balloon not pulled up against intraabdominal wall.  He currently has a cortrak for post pyloric tube feeds.  Difficult disposition at this point as he's going to need cortrak for nutrition while g tube is to gravity.    See below for additional details    Assessment & Plan:  Principal Problem:   Cellulitis Active Problems:   Hypotension   Paroxysmal atrial fibrillation (HCC)   HFrEF (heart failure with reduced ejection fraction) (HCC)   C6 cervical fracture (HCC)   Controlled type 2 diabetes mellitus without complication, without long-term current use of insulin (HCC)   Delirium   S/P CABG x 3   Hypernatremia   Dysphagia   Anemia   Urinary retention   Diarrhea   Abnormal LFTs   Pressure ulcer   Goals of care, counseling/discussion   Status post insertion of percutaneous endoscopic gastrostomy (PEG) tube ([place])   Hematuria    Assessment and Plan: * Cellulitis Pt has dysphagia and is s/p PEG tube placement on 5/24.  Presents with malodor and purulent discharge from PEG tube site. CT abd/pelvis showed 6.4x2.2 cm area of inflammatory/infectious phlegmon in subcutaneous plaine at site of placement of gastrostomy, pockets of air in the subcutaneous plane at the site of g tube placement (due  to open wound in skin or suggest infection with gas producing organisms).  No definite demonstrable thick walled loculated fluid collection. S/p fluoroscopic guided replacement and up sizing of now 24 fr ballon retention gastrostomy tube 6/3 per Dr. Pascal Lux, suspected pressure ulcer about medial aspect of g tube insertion - likely due to insertion angle of tube and external retention disc being too tight Appreciate surgery recommedations  - g tube to gravity  - local wound care (zinc oxide to intact portion of epidermis, dressing changes around g tube 4x daily)  - post pyloric nutrition  - wound care  - available as needed, IR can follow up prn as well Issue at this time is g tube is to gravity and he has cortrak while we're allowing wound to heal.  It's not clear how long he'll need g tube to gravity and post pyloric feeding at this point.  -narrow abx, will plan short course of ancef given overall improvement (he's tolerated cephalosporins and augmentin in past per pharmacy) -Wound cultures are pending (moderate staph epi, moderate candida albicans - not sure how clinically relevant these are - will follow) -blood cultures negative. -Wound care consulted, appreciate recs - medi honey added -On 01/29/2022 in the evening it was noted per RN and daughter that wound had looked worse and crossed, came and assessed the patient and IV antibiotics broadened back to vancomycin and Merrem. -Patient reassessed by general surgery again 01/30/2022 and stating wound from a pressure necrosis from his G-tube, recommending continued  current wound care, de-escalation of antibiotics, and if wound is not shrinking over the next 1 to 2 weeks other considerations will need to be entertained at that time.  General surgery recommending continuing current wound care, postpyloric feeds. - IV antibiotics have been narrowed back to Ancef to complete a 10-day course of antibiotic treatment. -Appreciate general surgery's input and  recommendations.  Hypotension Presented with hypotension with SBP of 90 over 50s.  This has improved with fluid but remains soft.  Previously on Lasix, Entresto, Aldactone and beta-blockers at home but these were hold during last admission.   -Continue to hold hypertensive medications secondary to low blood pressure. -Saline lock IV fluids.  -repeat echo -> EF 40-45% - BP overall improved, follow  C6 cervical fracture ([place]) This is from a ground-level fall back in April.  Was on a soft cervical collar and continue follow-up with neurosurgery. Discussed with Dr. Ellene Route, he notes ok to d/c c collar Flex ex films  with known subacute fx through anterior inferior aspect of c 6 vertebral body, anterior osteophyte not well seen, C6-7 space normally aligned without abnormal motion on extension, not well assessed on flexion (see report) -Patient seen by neurosurgery who states C6 fracture is healed and patient can be removed from collar and pursue activities as tolerated.  HFrEF (heart failure with reduced ejection fraction) ([place]) Appears euvolemic on exam. Echo as above,  Paroxysmal atrial fibrillation (HCC) -Rate controlled on amiodarone.   -Eliquis for anticoagulation.  Delirium Delirium precautions -Improved.  Controlled type 2 diabetes mellitus without complication, without long-term current use of insulin (HCC) Controlled.  Last A1c 5.2% on 5/15. -CBG 131 this morning.   -Follow.  Hypernatremia - Likely secondary to dehydration.   -Improved with hydration.   -Patient placed on gentle hydration 01/31/2022 due to malposition of cortrack tube. -Cortrack placed back and in position. -IV fluids discontinued.  S/P CABG x 3 Stable and asymptomatic from cardiac standpoint.  Anemia  Anemia labs c/w aocd, iron def -Patient with hematuria over the past 2 days. -Hemoglobin stable at 7.4. -Transfusion threshold hemoglobin < 7.    Dysphagia S/p PEG tube by VIR on 5/24, s/p  replacement as above -Dietitian consulted for tube feeding  NPO for now with g tube to gravity, consider small amounts Po for comfort. -The afternoon of 01/31/2022, concerned that tube feeds coming out of PEG tube site and concern for malposition of cortrack. -Abdominal films obtained and feeding tube tip noted at the position of the pylorus directed towards the duodenum. -Advanced cortrack 4 cm, 02/01/2022 with repeat abdominal films showing appropriate placement of cortrack tube.  -Resumed tube feeds. -IV fluids have been discontinued. -Dietitian following and appreciate their input and recommendations.  Urinary retention Foley catheter placed on 5/27 due to urinary retention at recent admission.  He has planned outpatient follow-up with urology. -Patient underwent successful voiding trial.  -Urine output 1400 cc over the past 24 hours. -Continue doxazosin. -Monitor urine output.  Diarrhea Daughter reported persistent diarrhea since the start of tube feed on 5/25.  -Tube feeds changed with some improvement with diarrhea. Negative GI pathogen panel Negative c diff  Abnormal LFTs Mildly elevated -LFTs trending down. -Follow-up.  Pressure ulcer Pressure Injury 01/24/22 Buttocks Deep Tissue Pressure Injury - Purple or maroon localized area of discolored intact skin or blood-filled blister due to damage of underlying soft tissue from pressure and/or shear. (Active)  01/24/22 2036  Location: Buttocks  Location Orientation:   Staging: Deep Tissue Pressure Injury -  Purple or maroon localized area of discolored intact skin or blood-filled blister due to damage of underlying soft tissue from pressure and/or shear.  Wound Description (Comments):   Present on Admission: Yes   Wound c/s   Goals of care, counseling/discussion Not currently interested in palliative care discussions, noted we can review pending course  Hematuria - Patient with hematuria noted for the past 2 days  (02/04/2022, 02/05/2022). -Patient denies any abdominal pain, no dysuria, no nausea, no vomiting, no CVA tenderness, no back pain. -Patient with good urine output. -Per daughter patient with prior history of nephrolithiasis. -Renal ultrasound with no hydronephrosis or shadowing stone, bilateral renal cyst, minimal bladder debris.   -Check urine culture.   -Place Foley catheter, irrigate Foley.   -If no improvement with irrigation of Foley catheter will consult with urology for further evaluation and management  -Supportive care.         DVT prophylaxis: Eliquis Code Status: Full. Family Communication: Updated patient.  No family at bedside. Disposition: CIR versus SNF  Status is: Inpatient Remains inpatient appropriate because: Severity of illness   Consultants:  Neurosurgery: Dr. Ellene Route 01/28/2022 General surgery: Dr. Kieth Brightly 01/26/2022 Wound care RN   Procedures:  CT abdomen and pelvis 01/24/2022 Plain films of the cervical spine 01/27/2022 Fluoroscopic guided replacement of gastrostomy tube with upsizing 01/25/2022 interventional radiology 2D echo 01/27/2022    Antimicrobials:  Diflucan 01/25/2022>>>> 02/02/2022 IV Ancef 01/27/2022>>>>> 02/02/2022 IV Rocephin 01/27/2022 x 1 dose IV Merrem 01/24/2022>>>> 01/27/2022 IV vancomycin 01/24/2022>>>>> 01/27/2022    Subjective: Patient sleeping but arousable.  No chest pain.  No shortness of breath.  No dysuria.  No abdominal pain.  Hematuria noted in canister. .   Objective: Vitals:   02/04/22 0741 02/04/22 1735 02/05/22 0528 02/05/22 0908  BP: (!) 121/57 (!) 112/55 120/81   Pulse: 64 71 79   Resp: '18 17 20   '$ Temp: 97.6 F (36.4 C) 97.8 F (36.6 C) 97.8 F (36.6 C)   TempSrc: Oral  Oral   SpO2: 99% 99% 96% 96%  Weight: 115.2 kg     Height:        Intake/Output Summary (Last 24 hours) at 02/05/2022 1531 Last data filed at 02/05/2022 0528 Gross per 24 hour  Intake 0 ml  Output 1700 ml  Net -1700 ml   Filed Weights   02/02/22 0500  02/03/22 0500 02/04/22 0741  Weight: 115.2 kg 117 kg 115.2 kg    Examination:  General exam: Alert.  NAD. Cortrack. Respiratory system: CTA B anterior lung fields.  No wheezes, no crackles, no rhonchi.  Fair air movement.  Speaking in full sentences. Cardiovascular system: RRR no murmurs rubs or gallops.  No JVD.  No lower extremity edem. Gastrointestinal system: Abdomen is soft, nontender, nondistended, positive bowel sounds.  G-tube noted with dressing intact at insertion site.  Central nervous system: Alert and oriented. No focal neurological deficits. Extremities: Symmetric 5 x 5 power. Skin: No rashes, lesions or ulcers Psychiatry: Judgement and insight appear normal. Mood & affect appropriate.     Data Reviewed:   CBC: Recent Labs  Lab 01/30/22 0503 01/31/22 0906 02/01/22 0152 02/02/22 0110 02/03/22 0725 02/04/22 1138 02/05/22 0022  WBC 5.8   < > 7.9 7.6 7.6 6.2 6.9  NEUTROABS 3.2  --   --   --   --  4.3 4.6  HGB 8.3*   < > 8.2* 7.5* 8.3* 7.4* 7.4*  HCT 27.4*   < > 26.2* 24.3* 26.4* 23.6* 23.7*  MCV 100.4*   < > 100.0 99.6 100.8* 99.6 98.8  PLT 108*   < > 103* 105* 96* 104* 114*   < > = values in this interval not displayed.    Basic Metabolic Panel: Recent Labs  Lab 01/29/22 1635 01/30/22 0503 01/30/22 0503 01/31/22 4193 02/01/22 0152 02/02/22 0110 02/03/22 0725 02/04/22 1206 02/05/22 0022  NA  --  143   < > 139 140 139 137 136 135  K  --  3.6   < > 4.1 4.7 4.2 4.5 4.6 4.3  CL  --  108   < > 105 107 109 107 107 106  CO2  --  27   < > '27 25 27 24 25 25  '$ GLUCOSE  --  135*   < > 141* 102* 136* 122* 129* 140*  BUN  --  28*   < > 27* 33* 36* 36* 38* 31*  CREATININE  --  0.85   < > 0.73 0.76 0.72 0.72 0.67 0.67  CALCIUM  --  8.5*   < > 8.1* 8.4* 8.2* 8.2* 8.2* 8.3*  MG 2.0 2.1  --  2.2  --   --  2.1  --   --   PHOS 2.5 2.8  --  2.7  --   --  2.4*  --   --    < > = values in this interval not displayed.    GFR: Estimated Creatinine Clearance: 85.6  mL/min (by C-G formula based on SCr of 0.67 mg/dL).  Liver Function Tests: Recent Labs  Lab 01/30/22 0503 01/31/22 0906 02/03/22 0725  AST 39  --   --   ALT 14  --   --   ALKPHOS 105  --   --   BILITOT 0.7  --   --   PROT 4.4*  --   --   ALBUMIN 1.7* 1.6* 1.5*    CBG: Recent Labs  Lab 02/04/22 1206 02/04/22 1727 02/05/22 0039 02/05/22 0656 02/05/22 1447  GLUCAP 125* 126* 141* 131* 121*     No results found for this or any previous visit (from the past 240 hour(s)).        Radiology Studies: US RENAL  Result Date: 02/04/2022 CLINICAL DATA:  Hematuria. EXAM: RENAL / URINARY TRACT ULTRASOUND COMPLETE COMPARISON:  Renal ultrasound dated 08/28/2021. CT abdomen pelvis dated 01/24/2022. FINDINGS: Right Kidney: Renal measurements: 12.4 x 5.8 x 5.0 cm = volume: 188 mL. There is mild diffuse parenchyma atrophy. Normal echogenicity. No hydronephrosis or shadowing stone. There is a 6.8 x 4.2 x 6.4 cm upper pole cyst. Additional cyst seen on the prior CT is poorly visualized on today's exam. Left Kidney: Renal measurements: 11.9 x 6.5 x 5.0 cm = volume: 200 mL. Mild parenchyma atrophy. Normal echogenicity. No hydronephrosis or shadowing stone. There is a 2.6 x 2.3 x 2.5 cm lateral lower pole cyst. Bladder: Small layering debris noted along the posterior bladder wall. Correlation with urinalysis recommended to exclude UTI. Other: None. IMPRESSION: 1. No hydronephrosis or shadowing stone. 2. Bilateral renal cysts. 3. Minimal bladder debris. Correlation with urinalysis recommended to exclude UTI. Electronically Signed   By: Anner Crete M.D.   On: 02/04/2022 19:43        Scheduled Meds:  acetaminophen  1,000 mg Per Tube Q8H   amiodarone  200 mg Per Tube Daily   apixaban  5 mg Per Tube BID   Chlorhexidine Gluconate Cloth  6 each Topical Q0600   doxazosin  1 mg Per Tube Daily   famotidine  20 mg Per Tube BID   feeding supplement (JEVITY 1.5 CAL/FIBER)  1,000 mL Per Tube Q24H    feeding supplement (PROSource TF)  45 mL Per Tube Daily   fluticasone furoate-vilanterol  1 puff Inhalation Daily   free water  120 mL Per Tube Q4H   leptospermum manuka honey  1 application  Topical BID   mirtazapine  7.5 mg Per Tube QHS   nutrition supplement (JUVEN)  1 packet Per Tube BID BM   venlafaxine  18.75 mg Per Tube BID WC   Zinc Oxide   Topical BID   Continuous Infusions:     LOS: 12 days    Time spent: 35 minutes    Irine Seal, MD [facility]   To contact the attending provider between 7A-7P or the covering provider during after hours 7P-7A, please log into the web site [email or web address] and access using universal [place] password for that web site. If you do not have the password, please call the hospital operator.  02/05/2022, 3:31 PM

## 2022-02-05 NOTE — Progress Notes (Signed)
Brief Nutrition Note  Visited pt in room this AM to assess tolerance to new TF regimen. Pt sleeping soundly and no family visiting this AM. Attempted to call daughter at home phone number, no answer at this time.  Reviewed chart and I&Os. Noted that pt had 1 AM charted each day over the last 24 hours. Stools appear to still be liquid, but this is not uncommon for enteral feeds. As only 1 BM charted per day, will continue current regimen.   Jevity 1.5 infusing at 23m/h via cortrak at the time of assessment.   Will continue to monitor. Noted that CIR has been asked to come and evaluate pt for appropriateness for admission.   RRanell Patrick RD, LDN Clinical Dietitian RD pager # available in [place] After hours/weekend pager # available in [facility]

## 2022-02-05 NOTE — Progress Notes (Signed)
Speech Language Pathology Treatment:    Patient Details Name: KAREY STUCKI MRN: [id number] DOB: [birth year] Today's Date: 02/05/2022 Time:  -     Received order for speech/language/cognitive assessment. Since initial fall in 11/2021 he has had 2 prior speech.cogn evaluations and ST picked him up for therapy both times. This admission was due to infected PEG site. Spoke with pt and family who report he was confused last week and is sometimes confused when he has his morphine but state he is oriented to where he is, knows family members. Recognized and named family members as they arrived with therapist present. He is dysarthric and family reports this is much improved when he has his dentures donned. They do not report a decline in cognition but improvements and are in agreement that full cognitive assessment does not appear needed at this time.                         Houston Siren  02/05/2022, 1:54 PM

## 2022-02-05 NOTE — Plan of Care (Signed)
  Problem: Education: Goal: Knowledge of General Education information will improve Description: Including pain rating scale, medication(s)/side effects and non-pharmacologic comfort measures Outcome: Not Progressing   Problem: Health Behavior/Discharge Planning: Goal: Ability to manage health-related needs will improve Outcome: Not Progressing   Problem: Clinical Measurements: Goal: Ability to maintain clinical measurements within normal limits will improve Outcome: Progressing Goal: Will remain free from infection Outcome: Progressing Goal: Diagnostic test results will improve Outcome: Progressing Goal: Respiratory complications will improve Outcome: Progressing Goal: Cardiovascular complication will be avoided Outcome: Progressing   Problem: Activity: Goal: Risk for activity intolerance will decrease Outcome: Not Progressing   Problem: Nutrition: Goal: Adequate nutrition will be maintained Outcome: Progressing   Problem: Coping: Goal: Level of anxiety will decrease Outcome: Progressing   Problem: Elimination: Goal: Will not experience complications related to bowel motility Outcome: Progressing Goal: Will not experience complications related to urinary retention Outcome: Progressing   Problem: Pain Managment: Goal: General experience of comfort will improve Outcome: Progressing   Problem: Safety: Goal: Ability to remain free from injury will improve Outcome: Progressing   Problem: Skin Integrity: Goal: Risk for impaired skin integrity will decrease Outcome: Progressing   

## 2022-02-06 DIAGNOSIS — L03311 Cellulitis of abdominal wall: Secondary | ICD-10-CM | POA: Diagnosis not present

## 2022-02-06 DIAGNOSIS — L03818 Cellulitis of other sites: Secondary | ICD-10-CM | POA: Diagnosis not present

## 2022-02-06 DIAGNOSIS — D649 Anemia, unspecified: Secondary | ICD-10-CM | POA: Diagnosis not present

## 2022-02-06 DIAGNOSIS — R7989 Other specified abnormal findings of blood chemistry: Secondary | ICD-10-CM | POA: Diagnosis not present

## 2022-02-06 LAB — CBC
HCT: 25.8 % — ABNORMAL LOW (ref 39.0–52.0)
Hemoglobin: 7.8 g/dL — ABNORMAL LOW (ref 13.0–17.0)
MCH: 30.4 pg (ref 26.0–34.0)
MCHC: 30.2 g/dL (ref 30.0–36.0)
MCV: 100.4 fL — ABNORMAL HIGH (ref 80.0–100.0)
Platelets: 115 10*3/uL — ABNORMAL LOW (ref 150–400)
RBC: 2.57 MIL/uL — ABNORMAL LOW (ref 4.22–5.81)
RDW: 20.4 % — ABNORMAL HIGH (ref 11.5–15.5)
WBC: 6.9 10*3/uL (ref 4.0–10.5)
nRBC: 0 % (ref 0.0–0.2)

## 2022-02-06 LAB — BASIC METABOLIC PANEL
Anion gap: 7 (ref 5–15)
BUN: 37 mg/dL — ABNORMAL HIGH (ref 8–23)
CO2: 25 mmol/L (ref 22–32)
Calcium: 8.6 mg/dL — ABNORMAL LOW (ref 8.9–10.3)
Chloride: 104 mmol/L (ref 98–111)
Creatinine, Ser: 0.6 mg/dL — ABNORMAL LOW (ref 0.61–1.24)
GFR, Estimated: >60 mL/min (ref >60)
Glucose, Bld: 127 mg/dL — ABNORMAL HIGH (ref 70–99)
Potassium: 4.6 mmol/L (ref 3.5–5.1)
Sodium: 136 mmol/L (ref 135–145)

## 2022-02-06 LAB — GLUCOSE, CAPILLARY
Glucose-Capillary: 117 mg/dL — ABNORMAL HIGH (ref 70–99)
Glucose-Capillary: 151 mg/dL — ABNORMAL HIGH (ref 70–99)

## 2022-02-06 NOTE — Progress Notes (Signed)
Mobility Specialist Progress Note:   02/06/22 1215  Mobility  Activity Transferred from bed to chair  Level of Assistance Maximum assist, patient does 25-49%  Assistive Device MaxiMove  Activity Response Tolerated fair  $Mobility charge 1 Mobility   Pt transferred to chair via maximove. Required maxA+2 to turn to place pad under. Pt tolerated fair, c/o knee pain throughout. Pt left in chair with all needs met, family present.   [name] Acute Rehab Secure Chat or Office Phone: [phone]

## 2022-02-06 NOTE — Progress Notes (Signed)
Occupational Therapy Treatment Patient Details Name: [name] MRN: [id number] DOB: 1932/08/31 Today's Date: 02/06/2022   History of present illness [name] is a 88 y.o. male who presented with purulent discharge from feeding tube. Due to likely pressure wound related to G tube, IR upsized G tube on 6/3 and cortrak was placed for temporary nutrition alternative. PMH: atrial fibrillation, chronic systolic CHF, CAD s/p CABG, sick sinus syndrome s/p pacemaker, COPD, type 2 diabetes, hypertension, hyperlipidemia, chronic thrombocytopenia, recurrent UTI, OSA not on CPAP, dysphagia with PEG tube , hx of C6 fx   OT comments  Patient seen with mobility technician [name] to address OOB transfer with maximove. Patient rolled in bed for placement of pads and transferred to recliner with maximove lift. AAROM and grooming tasks performed while seated in recliner. Patient appeared comfortable with family in room at end of session.    Recommendations for follow up therapy are one component of a multi-disciplinary discharge planning process, led by the attending physician.  Recommendations may be updated based on patient status, additional functional criteria and insurance authorization.    Follow Up Recommendations  Acute inpatient rehab (3hours/day)    Assistance Recommended at Discharge Frequent or constant Supervision/Assistance  Patient can return home with the following  Two people to help with walking and/or transfers;A lot of help with bathing/dressing/bathroom;Direct supervision/assist for medications management;Direct supervision/assist for financial management;Assistance with feeding;Assist for transportation;Help with stairs or ramp for entrance   Equipment Recommendations  Other (comment) (air mattress overlay)    Recommendations for Other Services      Precautions / Restrictions Precautions Precautions: Fall;Other (comment) Precaution Booklet Issued: No Precaution Comments:  G tube draining to gravity (ok to mobilize with therapy per MD); soft collar discontinued Restrictions Weight Bearing Restrictions: No       Mobility Bed Mobility Overal bed mobility: Needs Assistance Bed Mobility: Rolling Rolling: Mod assist         General bed mobility comments: rolling performed to position lift pad    Transfers Overall transfer level: Needs assistance Equipment used: Ambulation equipment used Transfers: Bed to chair/wheelchair/BSC             General transfer comment: transferred from bed to recliner with maximove Transfer via Lift Equipment: Maximove   Balance Overall balance assessment: Needs assistance Sitting-balance support: No upper extremity supported, Feet supported, Single extremity supported Sitting balance-Leahy Scale: Poor Sitting balance - Comments: sitting in recliner                                   ADL either performed or assessed with clinical judgement   ADL Overall ADL's : Needs assistance/impaired     Grooming: Wash/dry face;Minimal assistance;Sitting Grooming Details (indicate cue type and reason): required hand over hand to initate and able to perform after with verbal cues                               General ADL Comments: limited due to pain and level of arousal    Extremity/Trunk Assessment              Vision       Perception     Praxis      Cognition Arousal/Alertness: Lethargic Behavior During Therapy: Flat affect, Restless, Anxious Overall Cognitive Status: Impaired/Different from baseline Area of Impairment: Orientation, Attention, Memory, Following commands, Safety/judgement,  Awareness, Problem solving                 Orientation Level: Disoriented to, Time, Situation Current Attention Level: Sustained Memory: Decreased recall of precautions, Decreased short-term memory Following Commands: Follows one step commands with increased time Safety/Judgement:  Decreased awareness of safety, Decreased awareness of deficits Awareness: Intellectual Problem Solving: Slow processing, Decreased initiation, Requires verbal cues, Requires tactile cues General Comments: lethargic but able to arouse to follow directions, yelling out more on this date        Exercises Exercises: General Upper Extremity General Exercises - Upper Extremity Shoulder Flexion: AAROM, Both, 10 reps, Seated Shoulder ABduction: AAROM, Both, 10 reps, Seated    Shoulder Instructions       General Comments      Pertinent Vitals/ Pain       Pain Assessment Pain Assessment: Faces Faces Pain Scale: Hurts even more Pain Location: back and bottom Pain Descriptors / Indicators: Aching, Discomfort, Grimacing, Guarding, Moaning Pain Intervention(s): Limited activity within patient's tolerance, Monitored during session, Repositioned  Home Living                                          Prior Functioning/Environment              Frequency  Min 2X/week        Progress Toward Goals  OT Goals(current goals can now be found in the care plan section)  Progress towards OT goals: Progressing toward goals  Acute Rehab OT Goals Patient Stated Goal: get better OT Goal Formulation: With patient/family Time For Goal Achievement: 02/12/22 Potential to Achieve Goals: Fair ADL Goals Pt Will Perform Eating: with modified independence;with adaptive utensils;sitting Pt Will Perform Grooming: with set-up;sitting Pt Will Perform Lower Body Bathing: with set-up;sitting/lateral leans;sit to/from stand;with adaptive equipment Pt Will Perform Upper Body Dressing: with min assist;sitting Pt Will Perform Lower Body Dressing: with mod assist;sitting/lateral leans;with adaptive equipment Pt Will Transfer to Toilet: with min assist;stand pivot transfer;bedside commode Pt Will Perform Toileting - Clothing Manipulation and hygiene: with max assist;sit to/from  stand Pt/caregiver will Perform Home Exercise Program: Increased strength;Both right and left upper extremity;With theraband;With minimal assist;With written HEP provided Additional ADL Goal #1: Pt to complete bed mobility at Min A in prep for ADL transfers  Plan Discharge plan remains appropriate;Discharge plan needs to be updated;Frequency needs to be updated    Co-evaluation                 AM-PAC OT "6 Clicks" Daily Activity     Outcome Measure   Help from another person eating meals?: Total Help from another person taking care of personal grooming?: A Lot Help from another person toileting, which includes using toliet, bedpan, or urinal?: Total Help from another person bathing (including washing, rinsing, drying)?: A Lot Help from another person to put on and taking off regular upper body clothing?: A Lot Help from another person to put on and taking off regular lower body clothing?: Total 6 Click Score: 9    End of Session Equipment Utilized During Treatment: Other (comment) (maximove)  OT Visit Diagnosis: Unsteadiness on feet (R26.81);Other abnormalities of gait and mobility (R26.89);Muscle weakness (generalized) (M62.81);History of falling (Z91.81);Feeding difficulties (R63.3);Other symptoms and signs involving cognitive function;Adult, failure to thrive (R62.7);Pain   Activity Tolerance Patient limited by fatigue   Patient Left in chair;with call bell/phone within  reach;with family/visitor present   Nurse Communication Mobility status        Time: 1200-1219 OT Time Calculation (min): 19 min  Charges: OT General Charges $OT Visit: 1 Visit OT Treatments $Therapeutic Activity: 8-22 mins  Lodema Hong, OTA Acute Rehabilitation Services  Pager [phone] Office [place] 02/06/2022, 1:54 PM

## 2022-02-06 NOTE — Progress Notes (Signed)
Mobility Specialist Progress Note:   02/06/22 1405  Mobility  Activity Transferred from chair to bed  Level of Assistance Maximum assist, patient does 25-49%  Assistive Device MaxiMove  Activity Response Tolerated fair  $Mobility charge 1 Mobility   Pt requesting to return to bed after 87min up in chair. C/o R knee pain when flexed. Pt back in bed with all needs met, daughter and granddaughter present.   [name] Acute Rehab Secure Chat or Office Phone: [phone]

## 2022-02-06 NOTE — Progress Notes (Signed)
Occupational Therapy Treatment Patient Details Name: [name] MRN: [id number] DOB: 06-21-1933 Today's Date: 02/06/2022   History of present illness [name] is a 88 y.o. male who presented with purulent discharge from feeding tube. Due to likely pressure wound related to G tube, IR upsized G tube on 6/3 and cortrak was placed for temporary nutrition alternative. PMH: atrial fibrillation, chronic systolic CHF, CAD s/p CABG, sick sinus syndrome s/p pacemaker, COPD, type 2 diabetes, hypertension, hyperlipidemia, chronic thrombocytopenia, recurrent UTI, OSA not on CPAP, dysphagia with PEG tube , hx of C6 fx   OT comments  Patient's daughter approached therapist and asked for assistance to get patient back to bed. Patient tolerated ~30 minutes of sitting in recliner and was moaning and stating he was in too much pain to continue in recliner. Patient was assisted back to bed with maximove left and positioned in bed for comfort. Acute OT to continue to follow.    Recommendations for follow up therapy are one component of a multi-disciplinary discharge planning process, led by the attending physician.  Recommendations may be updated based on patient status, additional functional criteria and insurance authorization.    Follow Up Recommendations  Acute inpatient rehab (3hours/day)    Assistance Recommended at Discharge Frequent or constant Supervision/Assistance  Patient can return home with the following  Two people to help with walking and/or transfers;A lot of help with bathing/dressing/bathroom;Direct supervision/assist for medications management;Direct supervision/assist for financial management;Assistance with feeding;Assist for transportation;Help with stairs or ramp for entrance   Equipment Recommendations  Other (comment) (air mattress overlay)    Recommendations for Other Services      Precautions / Restrictions Precautions Precautions: Fall;Other (comment) Precaution Booklet  Issued: No Precaution Comments: G tube draining to gravity (ok to mobilize with therapy per MD); soft collar discontinued Restrictions Weight Bearing Restrictions: No       Mobility Bed Mobility Overal bed mobility: Needs Assistance Bed Mobility: Rolling Rolling: Max assist         General bed mobility comments: rolling performed in bed to remove bed pads    Transfers Overall transfer level: Needs assistance Equipment used: Ambulation equipment used Transfers: Bed to chair/wheelchair/BSC             General transfer comment: transferred from recliner to bed Transfer via Lift Equipment: Maximove   Balance Overall balance assessment: Needs assistance Sitting-balance support: No upper extremity supported, Feet supported, Single extremity supported Sitting balance-Leahy Scale: Poor Sitting balance - Comments: sitting in recliner                                   ADL either performed or assessed with clinical judgement   ADL Overall ADL's : Needs assistance/impaired     Grooming: Wash/dry face;Minimal assistance;Sitting Grooming Details (indicate cue type and reason): required hand over hand to initate and able to perform after with verbal cues                               General ADL Comments: limited due to pain and level of arousal    Extremity/Trunk Assessment              Vision       Perception     Praxis      Cognition Arousal/Alertness: Awake/alert Behavior During Therapy: Flat affect, Restless, Anxious Overall Cognitive Status: Impaired/Different from  baseline Area of Impairment: Orientation, Attention, Memory, Following commands, Safety/judgement, Awareness, Problem solving                 Orientation Level: Disoriented to, Time, Situation Current Attention Level: Sustained Memory: Decreased recall of precautions, Decreased short-term memory Following Commands: Follows one step commands with increased  time Safety/Judgement: Decreased awareness of safety, Decreased awareness of deficits Awareness: Intellectual Problem Solving: Slow processing, Decreased initiation, Requires verbal cues, Requires tactile cues General Comments: Patient tolerated ~30 minutes of sitting OOB in recliner before complaining of discomfort and asking to return to bed        Exercises Exercises: General Upper Extremity General Exercises - Upper Extremity Shoulder Flexion: AAROM, Both, 10 reps, Seated Shoulder ABduction: AAROM, Both, 10 reps, Seated    Shoulder Instructions       General Comments      Pertinent Vitals/ Pain       Pain Assessment Pain Assessment: Faces Faces Pain Scale: Hurts whole lot Pain Location: back and bottom Pain Descriptors / Indicators: Aching, Discomfort, Grimacing, Guarding, Moaning Pain Intervention(s): Limited activity within patient's tolerance, Repositioned  Home Living                                          Prior Functioning/Environment              Frequency  Min 2X/week        Progress Toward Goals  OT Goals(current goals can now be found in the care plan section)  Progress towards OT goals: Progressing toward goals  Acute Rehab OT Goals Patient Stated Goal: get better OT Goal Formulation: With patient/family Time For Goal Achievement: 02/12/22 Potential to Achieve Goals: Fair ADL Goals Pt Will Perform Eating: with modified independence;with adaptive utensils;sitting Pt Will Perform Grooming: with set-up;sitting Pt Will Perform Lower Body Bathing: with set-up;sitting/lateral leans;sit to/from stand;with adaptive equipment Pt Will Perform Upper Body Dressing: with min assist;sitting Pt Will Perform Lower Body Dressing: with mod assist;sitting/lateral leans;with adaptive equipment Pt Will Transfer to Toilet: with min assist;stand pivot transfer;bedside commode Pt Will Perform Toileting - Clothing Manipulation and hygiene: with  max assist;sit to/from stand Pt/caregiver will Perform Home Exercise Program: Increased strength;Both right and left upper extremity;With theraband;With minimal assist;With written HEP provided Additional ADL Goal #1: Pt to complete bed mobility at Min A in prep for ADL transfers  Plan Discharge plan remains appropriate;Discharge plan needs to be updated;Frequency needs to be updated    Co-evaluation                 AM-PAC OT "6 Clicks" Daily Activity     Outcome Measure   Help from another person eating meals?: Total Help from another person taking care of personal grooming?: A Lot Help from another person toileting, which includes using toliet, bedpan, or urinal?: Total Help from another person bathing (including washing, rinsing, drying)?: A Lot Help from another person to put on and taking off regular upper body clothing?: A Lot Help from another person to put on and taking off regular lower body clothing?: Total 6 Click Score: 9    End of Session Equipment Utilized During Treatment: Other (comment) (maximove)  OT Visit Diagnosis: Unsteadiness on feet (R26.81);Other abnormalities of gait and mobility (R26.89);Muscle weakness (generalized) (M62.81);History of falling (Z91.81);Feeding difficulties (R63.3);Other symptoms and signs involving cognitive function;Adult, failure to thrive (R62.7);Pain   Activity Tolerance Patient  limited by pain   Patient Left in bed;with call bell/phone within reach;with family/visitor present   Nurse Communication Mobility status        Time: 0301-4996 OT Time Calculation (min): 10 min  Charges: OT General Charges $OT Visit: 1 Visit OT Treatments $Therapeutic Activity: 8-22 mins  Lodema Hong, [place]  Pager [phone] Office [place] 02/06/2022, 2:34 PM

## 2022-02-06 NOTE — Progress Notes (Addendum)
PROGRESS NOTE    [name]  OVF:[id number] DOB: Apr 09, 1933 DOA: 01/24/2022 PCP: [name], DO    Chief Complaint  Patient presents with   feeding tube issue     Brief Narrative:  DHILAN BRAUER is a 88 y.o. male with medical history significant of paroxysmal atrial fibrillation, chronic systolic CHF, CAD s/p CABG, sick sinus syndrome s/p pacemaker, COPD, type 2 diabetes, hypertension, hyperlipidemia, chronic thrombocytopenia, recurrent UTI, OSA not on CPAP, dysphagia with PEG tube who presents with concerns of purulent discharge from feeding tube.  S/p upsizing of g tube on 6/3.  Surgery c/s and recommended keeping g tube to gravity to ensure no pressure of phlange on skin and balloon not pulled up against intraabdominal wall.  He currently has a cortrak for post pyloric tube feeds.  Difficult disposition at this point as he's going to need cortrak for nutrition while g tube is to gravity.    See below for additional details    Assessment & Plan:  Principal Problem:   Cellulitis Active Problems:   Hypotension   Paroxysmal atrial fibrillation (HCC)   HFrEF (heart failure with reduced ejection fraction) (HCC)   C6 cervical fracture (HCC)   Controlled type 2 diabetes mellitus without complication, without long-term current use of insulin (HCC)   Delirium   S/P CABG x 3   Hypernatremia   Dysphagia   Anemia   Urinary retention   Diarrhea   Abnormal LFTs   Pressure ulcer   Goals of care, counseling/discussion   Status post insertion of percutaneous endoscopic gastrostomy (PEG) tube ([place])   Hematuria    Assessment and Plan: * Cellulitis Pt has dysphagia and is s/p PEG tube placement on 5/24.  Presents with malodor and purulent discharge from PEG tube site. CT abd/pelvis showed 6.4x2.2 cm area of inflammatory/infectious phlegmon in subcutaneous plaine at site of placement of gastrostomy, pockets of air in the subcutaneous plane at the site of g tube placement (due  to open wound in skin or suggest infection with gas producing organisms).  No definite demonstrable thick walled loculated fluid collection. S/p fluoroscopic guided replacement and up sizing of now 24 fr ballon retention gastrostomy tube 6/3 per Dr. Pascal Lux, suspected pressure ulcer about medial aspect of g tube insertion - likely due to insertion angle of tube and external retention disc being too tight Appreciate surgery recommedations  - g tube to gravity  - local wound care (zinc oxide to intact portion of epidermis, dressing changes around g tube 4x daily)  - post pyloric nutrition  - wound care  - available as needed, IR can follow up prn as well Issue at this time is g tube is to gravity and he has cortrak while we're allowing wound to heal.  It's not clear how long he'll need g tube to gravity and post pyloric feeding at this point.  -narrow abx, will plan short course of ancef given overall improvement (he's tolerated cephalosporins and augmentin in past per pharmacy) -Wound cultures are pending (moderate staph epi, moderate candida albicans - not sure how clinically relevant these are - will follow) -blood cultures negative. -Wound care consulted, appreciate recs - medi honey added -On 01/29/2022 in the evening it was noted per RN and daughter that wound had looked worse and crossed, came and assessed the patient and IV antibiotics broadened back to vancomycin and Merrem. -Patient reassessed by general surgery again 01/30/2022 and stating wound from a pressure necrosis from his G-tube, recommending continued  current wound care, de-escalation of antibiotics, and if wound is not shrinking over the next 1 to 2 weeks other considerations will need to be entertained at that time.  General surgery recommending continuing current wound care, postpyloric feeds. - IV antibiotics have been narrowed back to Ancef to complete a 10-day course of antibiotic treatment. -Appreciate general surgery's input and  recommendations.  Hypotension Presented with hypotension with SBP of 90 over 50s.  This has improved with fluid but remains soft.  Previously on Lasix, Entresto, Aldactone and beta-blockers at home but these were hold during last admission.   -Continue to hold hypertensive medications secondary to low blood pressure. -Saline lock IV fluids.  -repeat echo -> EF 40-45% - BP overall improved, follow  C6 cervical fracture ([place]) This is from a ground-level fall back in April.  Was on a soft cervical collar and continue follow-up with neurosurgery. Discussed with Dr. Ellene Route, he notes ok to d/c c collar Flex ex films  with known subacute fx through anterior inferior aspect of c 6 vertebral body, anterior osteophyte not well seen, C6-7 space normally aligned without abnormal motion on extension, not well assessed on flexion (see report) -Patient seen by neurosurgery who states C6 fracture is healed and patient can be removed from collar and pursue activities as tolerated.  HFrEF (heart failure with reduced ejection fraction) ([place]) Appears euvolemic on exam. Echo as above,  Paroxysmal atrial fibrillation (HCC) -Rate controlled on amiodarone.   -Eliquis for anticoagulation.  Delirium Delirium precautions -Improved.  Controlled type 2 diabetes mellitus without complication, without long-term current use of insulin (HCC) Controlled.  Last A1c 5.2% on 5/15. -CBG 131 this morning.   -Follow.  Hypernatremia - Likely secondary to dehydration.   -Improved with hydration.   -Patient placed on gentle hydration 01/31/2022 due to malposition of cortrack tube. -Cortrack placed back and in position. -IV fluids discontinued.  S/P CABG x 3 Stable and asymptomatic from cardiac standpoint.  Anemia  Anemia labs c/w aocd, iron def -Patient with hematuria over the past 2 days. -Hemoglobin stable at 7.4. -Transfusion threshold hemoglobin < 7.    Dysphagia S/p PEG tube by VIR on 5/24, s/p  replacement as above -Dietitian consulted for tube feeding  NPO for now with g tube to gravity, consider small amounts Po for comfort. -The afternoon of 01/31/2022, concerned that tube feeds coming out of PEG tube site and concern for malposition of cortrack. -Abdominal films obtained and feeding tube tip noted at the position of the pylorus directed towards the duodenum. -Advanced cortrack 4 cm, 02/01/2022 with repeat abdominal films showing appropriate placement of cortrack tube.  -Resumed tube feeds. -IV fluids have been discontinued. -Dietitian following and appreciate their input and recommendations.  Urinary retention Foley catheter placed on 5/27 due to urinary retention at recent admission.  He has planned outpatient follow-up with urology. -Patient underwent successful voiding trial.  -Urine output 1400 cc over the past 24 hours. -Patient with good urine output post Foley catheter placement with hematuria noted. -Continue doxazosin. -Monitor urine output.  Diarrhea Daughter reported persistent diarrhea since the start of tube feed on 5/25.  -Tube feeds changed with some improvement with diarrhea. Negative GI pathogen panel Negative c diff  Abnormal LFTs Mildly elevated -LFTs trending down. -Follow-up.  Pressure ulcer Pressure Injury 01/24/22 Buttocks Deep Tissue Pressure Injury - Purple or maroon localized area of discolored intact skin or blood-filled blister due to damage of underlying soft tissue from pressure and/or shear. (Active)  01/24/22 2036  Location: Buttocks  Location Orientation:   Staging: Deep Tissue Pressure Injury - Purple or maroon localized area of discolored intact skin or blood-filled blister due to damage of underlying soft tissue from pressure and/or shear.  Wound Description (Comments):   Present on Admission: Yes   Wound c/s   Goals of care, counseling/discussion Not currently interested in palliative care discussions, noted we can review  pending course  Hematuria - Patient with hematuria noted for the past 2 days (02/04/2022, 02/05/2022). -Patient denies any abdominal pain, no dysuria, no nausea, no vomiting, no CVA tenderness. -Per daughter patient with some complaints of back pain today -Patient with good urine output. -Per daughter patient with prior history of nephrolithiasis. -Renal ultrasound with no hydronephrosis or shadowing stone, bilateral renal cyst, minimal bladder debris.   -Urine cultures pending. -Continue Foley catheter and continue irrigation with Foley catheter. -We will hold Eliquis. -If patient with no significant improvement or ongoing bleeding/hematuria and if ongoing back pain, may consider CT renal stone protocol, for further evaluation.  -If continued gross hematuria and no significant improvement may need to consult with urology for further evaluation and management. -Supportive care.         DVT prophylaxis: Eliquis Code Status: Full. Family Communication: Updated patient.  Updated daughter at bedside.   Disposition: SNF  Status is: Inpatient Remains inpatient appropriate because: Severity of illness   Consultants:  Neurosurgery: Dr. Ellene Route 01/28/2022 General surgery: Dr. Kieth Brightly 01/26/2022 Wound care RN   Procedures:  CT abdomen and pelvis 01/24/2022 Plain films of the cervical spine 01/27/2022 Fluoroscopic guided replacement of gastrostomy tube with upsizing 01/25/2022 interventional radiology 2D echo 01/27/2022 Renal ultrasound 02/04/2022    Antimicrobials:  Diflucan 01/25/2022>>>> 02/02/2022 IV Ancef 01/27/2022>>>>> 02/02/2022 IV Rocephin 01/27/2022 x 1 dose IV Merrem 01/24/2022>>>> 01/27/2022 IV vancomycin 01/24/2022>>>>> 01/27/2022    Subjective: Sleeping but easily arousable.  Daughter at bedside.  No chest pain.  No shortness of breath.  No dysuria.  Per daughter patient with some complaints of lower back pain.  Still with gross hematuria noted.    Objective: Vitals:   02/06/22 0649  02/06/22 0740 02/06/22 0756 02/06/22 1531  BP: 122/65 118/63  (!) 114/57  Pulse: 72 65  72  Resp: '16 18  16  '$ Temp: 97.7 F (36.5 C) 97.6 F (36.4 C)  97.9 F (36.6 C)  TempSrc: Oral Oral    SpO2: 98% 98% 95% 96%  Weight:      Height:        Intake/Output Summary (Last 24 hours) at 02/06/2022 2003 Last data filed at 02/06/2022 1819 Gross per 24 hour  Intake 0 ml  Output 1200 ml  Net -1200 ml   Filed Weights   02/02/22 0500 02/03/22 0500 02/04/22 0741  Weight: 115.2 kg 117 kg 115.2 kg    Examination:  General exam: Alert.  NAD. Cortrack. Respiratory system: Lungs clear to auscultation anterior lung fields.  No wheezes, no crackles, no rhonchi.  Fair air movement.  Cardiovascular system: Regular rate rhythm no murmurs rubs or gallops.  No JVD.  No lower extremity edema.  Gastrointestinal system: Abdomen is soft, nontender, nondistended, positive bowel sounds.  G-tube noted with dressing intact at insertion site.  Central nervous system: Alert and oriented. No focal neurological deficits. Extremities: Symmetric 5 x 5 power. Skin: No rashes, lesions or ulcers Psychiatry: Judgement and insight appear normal. Mood & affect appropriate.     Data Reviewed:   CBC: Recent Labs  Lab 02/02/22 0110 02/03/22 0725 02/04/22  1138 02/05/22 0022 02/06/22 0032  WBC 7.6 7.6 6.2 6.9 6.9  NEUTROABS  --   --  4.3 4.6  --   HGB 7.5* 8.3* 7.4* 7.4* 7.8*  HCT 24.3* 26.4* 23.6* 23.7* 25.8*  MCV 99.6 100.8* 99.6 98.8 100.4*  PLT 105* 96* 104* 114* 115*    Basic Metabolic Panel: Recent Labs  Lab 01/31/22 0906 02/01/22 0152 02/02/22 0110 02/03/22 0725 02/04/22 1206 02/05/22 0022 02/06/22 0032  NA 139   < > 139 137 136 135 136  K 4.1   < > 4.2 4.5 4.6 4.3 4.6  CL 105   < > 109 107 107 106 104  CO2 27   < > '27 24 25 25 25  '$ GLUCOSE 141*   < > 136* 122* 129* 140* 127*  BUN 27*   < > 36* 36* 38* 31* 37*  CREATININE 0.73   < > 0.72 0.72 0.67 0.67 0.60*  CALCIUM 8.1*   < > 8.2* 8.2*  8.2* 8.3* 8.6*  MG 2.2  --   --  2.1  --   --   --   PHOS 2.7  --   --  2.4*  --   --   --    < > = values in this interval not displayed.    GFR: Estimated Creatinine Clearance: 85.6 mL/min (A) (by C-G formula based on SCr of 0.6 mg/dL (L)).  Liver Function Tests: Recent Labs  Lab 01/31/22 0906 02/03/22 0725  ALBUMIN 1.6* 1.5*    CBG: Recent Labs  Lab 02/05/22 1447 02/05/22 1737 02/05/22 2352 02/06/22 1250 02/06/22 1723  GLUCAP 121* 117* 120* 117* 151*     Recent Results (from the past 240 hour(s))  Urine Culture     Status: Abnormal (Preliminary result)   Collection Time: 02/05/22  8:26 AM   Specimen: Urine, Clean Catch  Result Value Ref Range Status   Specimen Description URINE, CLEAN CATCH  Final   Special Requests   Final    NONE Performed at [facility], [place] [address] [place], [place] [address]    Culture 40,000 COLONIES/mL ENTEROCOCCUS FAECIUM (A)  Final   Report Status PENDING  Incomplete          Radiology Studies: No results found.      Scheduled Meds:  acetaminophen  1,000 mg Per Tube Q8H   amiodarone  200 mg Per Tube Daily   Chlorhexidine Gluconate Cloth  6 each Topical Q0600   doxazosin  1 mg Per Tube Daily   famotidine  20 mg Per Tube BID   feeding supplement (JEVITY 1.5 CAL/FIBER)  1,000 mL Per Tube Q24H   feeding supplement (PROSource TF)  45 mL Per Tube Daily   fluticasone furoate-vilanterol  1 puff Inhalation Daily   free water  120 mL Per Tube Q4H   leptospermum manuka honey  1 application  Topical BID   mirtazapine  7.5 mg Per Tube QHS   nutrition supplement (JUVEN)  1 packet Per Tube BID BM   venlafaxine  18.75 mg Per Tube BID WC   Zinc Oxide   Topical BID   Continuous Infusions:     LOS: 13 days    Time spent: 35 minutes    Irine Seal, MD [facility]   To contact the attending provider between 7A-7P or the covering provider during after hours 7P-7A, please log into the web site  [email or web address] and access using universal [place] password for that web site. If  you do not have the password, please call the hospital operator.  02/06/2022, 8:03 PM

## 2022-02-07 DIAGNOSIS — L03818 Cellulitis of other sites: Secondary | ICD-10-CM | POA: Diagnosis not present

## 2022-02-07 LAB — CBC
HCT: 25.2 % — ABNORMAL LOW (ref 39.0–52.0)
Hemoglobin: 7.8 g/dL — ABNORMAL LOW (ref 13.0–17.0)
MCH: 30.8 pg (ref 26.0–34.0)
MCHC: 31 g/dL (ref 30.0–36.0)
MCV: 99.6 fL (ref 80.0–100.0)
Platelets: 118 10*3/uL — ABNORMAL LOW (ref 150–400)
RBC: 2.53 MIL/uL — ABNORMAL LOW (ref 4.22–5.81)
RDW: 20.5 % — ABNORMAL HIGH (ref 11.5–15.5)
WBC: 6.8 10*3/uL (ref 4.0–10.5)
nRBC: 0 % (ref 0.0–0.2)

## 2022-02-07 LAB — URINE CULTURE: Culture: 40000 — AB

## 2022-02-07 LAB — BASIC METABOLIC PANEL
Anion gap: 8 (ref 5–15)
BUN: 38 mg/dL — ABNORMAL HIGH (ref 8–23)
CO2: 24 mmol/L (ref 22–32)
Calcium: 8.8 mg/dL — ABNORMAL LOW (ref 8.9–10.3)
Chloride: 104 mmol/L (ref 98–111)
Creatinine, Ser: 0.63 mg/dL (ref 0.61–1.24)
GFR, Estimated: >60 mL/min (ref >60)
Glucose, Bld: 128 mg/dL — ABNORMAL HIGH (ref 70–99)
Potassium: 4.6 mmol/L (ref 3.5–5.1)
Sodium: 136 mmol/L (ref 135–145)

## 2022-02-07 LAB — GLUCOSE, CAPILLARY
Glucose-Capillary: 136 mg/dL — ABNORMAL HIGH (ref 70–99)
Glucose-Capillary: 137 mg/dL — ABNORMAL HIGH (ref 70–99)
Glucose-Capillary: 139 mg/dL — ABNORMAL HIGH (ref 70–99)
Glucose-Capillary: 139 mg/dL — ABNORMAL HIGH (ref 70–99)
Glucose-Capillary: 140 mg/dL — ABNORMAL HIGH (ref 70–99)

## 2022-02-07 NOTE — Progress Notes (Signed)
Physical Therapy Treatment Patient Details Name: [name] MRN: [id number] DOB: July 04, 1933 Today's Date: 02/07/2022   History of Present Illness [name] is a 88 y.o. male who presented with purulent discharge from feeding tube. Due to likely pressure wound related to G tube, IR upsized G tube on 6/3 and cortrak was placed for temporary nutrition alternative. PMH: atrial fibrillation, chronic systolic CHF, CAD s/p CABG, sick sinus syndrome s/p pacemaker, COPD, type 2 diabetes, hypertension, hyperlipidemia, chronic thrombocytopenia, recurrent UTI, OSA not on CPAP, dysphagia with PEG tube , hx of C6 fx    PT Comments    Pt admitted with above diagnosis. Pt continues to fluctuate in functional gains each day. Pt definitely participates best with daughter present. Today, pt very weak and could not stand with +2 assist to [place].  Pt with poor endurance as well.  Noted that pt was denied Rehab therefore will continue to provide daughter with education to ease burden of care at home as much as possible.  Pt may be approaching baseline therefore will begin preparing daughter for using lift at home if needed.  Daughter continues to be adamant that she is taking pt home therefore she will need incr equipment and assist at home.  Pt currently with functional limitations due to balance and endurance deficits. Pt will benefit from skilled PT to increase their independence and safety with mobility to allow discharge to the venue listed below.      Recommendations for follow up therapy are one component of a multi-disciplinary discharge planning process, led by the attending physician.  Recommendations may be updated based on patient status, additional functional criteria and insurance authorization.  Follow Up Recommendations  Home health PT, [place], [place] RN     Assistance Recommended at Discharge Frequent or constant Supervision/Assistance  Patient can return home with the following Two people to help  with walking and/or transfers;Two people to help with bathing/dressing/bathroom;Assistance with cooking/housework;Assistance with feeding;Direct supervision/assist for medications management;Assist for transportation;Direct supervision/assist for financial management;Help with stairs or ramp for entrance   Equipment Recommendations  Wheelchair (measurements PT);Other (comment);Wheelchair cushion (measurements PT) (hoyer lift, air mattress overlay for bed)    Recommendations for Other Services       Precautions / Restrictions Precautions Precautions: Fall;Other (comment) Precaution Booklet Issued: No Precaution Comments: G tube draining to gravity (ok to mobilize with therapy per MD); soft collar discontinued Restrictions Weight Bearing Restrictions: No RLE Weight Bearing: Weight bearing as tolerated LLE Weight Bearing: Weight bearing as tolerated     Mobility  Bed Mobility Overal bed mobility: Needs Assistance Bed Mobility: Rolling Rolling: Max assist Sidelying to sit: Max assist       General bed mobility comments: Pt continues to need incr assist to come to EOB with pt initiating with LEs but still needing incr assist for trunk elevation.    Transfers Overall transfer level: Needs assistance Equipment used: Ambulation equipment used Transfers: Sit to/from Stand Sit to Stand: Max assist, +2 physical assistance, From elevated surface, Total assist           General transfer comment: Tried to stand x 3 with pt giving little effort each time.  Used pad but still pt could barely clear bottom off of bed.  Pt giving little effort to sit EOB after 3 attempts therefore laid pt back on bedd. Transfer via Lift Equipment: [place]  Ambulation/Gait               General Gait Details: unable today  Stairs             Wheelchair Mobility    Modified Rankin (Stroke Patients Only)       Balance Overall balance assessment: Needs assistance Sitting-balance  support: No upper extremity supported, Feet supported, Single extremity supported, Bilateral upper extremity supported Sitting balance-Leahy Scale: Poor Sitting balance - Comments: Pt needed mod to min assist to sit EOB until fatigued and then max assist. Pt with poor endurance. Postural control: Right lateral lean, Posterior lean Standing balance support: Bilateral upper extremity supported, Reliant on assistive device for balance Standing balance-Leahy Scale: Zero Standing balance comment: able to barely clear buttocks off bed with use of bed pads and max to total assist of 2                            Cognition Arousal/Alertness: Awake/alert Behavior During Therapy: Flat affect, Restless, Anxious Overall Cognitive Status: Impaired/Different from baseline Area of Impairment: Orientation, Attention, Memory, Following commands, Safety/judgement, Awareness, Problem solving                 Orientation Level: Disoriented to, Time, Situation Current Attention Level: Sustained Memory: Decreased recall of precautions, Decreased short-term memory Following Commands: Follows one step commands with increased time Safety/Judgement: Decreased awareness of safety, Decreased awareness of deficits Awareness: Intellectual Problem Solving: Slow processing, Decreased initiation, Requires verbal cues, Requires tactile cues          Exercises General Exercises - Upper Extremity Shoulder Flexion: AAROM, Both, 10 reps, Seated Elbow Flexion: AROM, Both, 10 reps, Supine Elbow Extension: AROM, Both, 10 reps, Supine General Exercises - Lower Extremity Ankle Circles/Pumps: AROM, Both, 10 reps Gluteal Sets: AROM, Both, 10 reps Short Arc Quad: AROM, Both, 10 reps Heel Slides: AAROM, Both, Supine, 10 reps Hip ABduction/ADduction: AAROM, Both, 10 reps Straight Leg Raises: Both, AAROM, 5 reps, Supine    General Comments General comments (skin integrity, edema, etc.): daughter was not  present during session.      Pertinent Vitals/Pain Pain Assessment Pain Assessment: Faces Faces Pain Scale: Hurts whole lot Breathing: normal Negative Vocalization: none Facial Expression: smiling or inexpressive Body Language: relaxed Consolability: no need to console PAINAD Score: 0 Pain Location: back and bottom Pain Descriptors / Indicators: Aching, Discomfort, Grimacing, Guarding, Moaning Pain Intervention(s): Limited activity within patient's tolerance, Monitored during session, Repositioned    Home Living                          Prior Function            PT Goals (current goals can now be found in the care plan section) Progress towards PT goals: Progressing toward goals    Frequency    Min 3X/week      PT Plan Discharge plan needs to be updated    Co-evaluation              AM-PAC PT "6 Clicks" Mobility   Outcome Measure  Help needed turning from your back to your side while in a flat bed without using bedrails?: Total Help needed moving from lying on your back to sitting on the side of a flat bed without using bedrails?: Total Help needed moving to and from a bed to a chair (including a wheelchair)?: Total Help needed standing up from a chair using your arms (e.g., wheelchair or bedside chair)?: Total Help needed to walk in hospital room?: Total Help needed  climbing 3-5 steps with a railing? : Total 6 Click Score: 6    End of Session   Activity Tolerance: Patient limited by pain;Patient limited by fatigue Patient left: in bed;with call bell/phone within reach Nurse Communication: Mobility status;Need for lift equipment PT Visit Diagnosis: History of falling (Z91.81);Pain;Muscle weakness (generalized) (M62.81);Unsteadiness on feet (R26.81) Pain - Right/Left: Right Pain - part of body: Hip     Time: 4604-7998 PT Time Calculation (min) (ACUTE ONLY): 19 min  Charges:  $Therapeutic Activity: 8-22 mins                     [facility]  M,PT Acute Rehab Services [phone]    Alvira Philips 02/07/2022, 1:29 PM

## 2022-02-07 NOTE — Progress Notes (Addendum)
Nutrition Follow-up  DOCUMENTATION CODES:  Severe malnutrition in context of chronic illness  INTERVENTION:  Obtain new measured weight. Continue TF as follows: Jevity 1.5 at 54m/h Free water flush [id number] Prosource TF 1x/d This provides 2000kcal, 95g of protein, 172334mree water (TF+flush)  NUTRITION DIAGNOSIS:  Severe Malnutrition (in the context of chronic illness) related to dysphagia as evidenced by severe fat depletion, severe muscle depletion. - remains applicable  GOAL:  Patient will meet greater than or equal to 90% of their needs - being addressed with TF  MONITOR:  Diet advancement, Labs, Weight trends, TF tolerance, Skin, I & O's  REASON FOR ASSESSMENT:  Consult Assessment of nutrition requirement/status  ASSESSMENT:  88 29o. male with medical history significant of paroxysmal atrial fibrillation, chronic systolic CHF, CAD s/p CABG x 3, sick sinus syndrome s/p pacemaker, COPD, type 2 diabetes, hypertension, hyperlipidemia, chronic thrombocytopenia, incisional hernia s/p repair 2004, inguinal hernia repair, recurrent UTI, OSA not on CPAP, prostate ca, COVID 19 (6/22), AAA, GERD, CVA and dysphagia s/p IR G tube 5/24 who presents with concerns of purulent discharge from feeding tube and diarrhea.  6/3 - IR replacement of 24 Fr balloon retention gastrostomy tube  6/5 - cortrak tube placement (post-pyloric)  Pt resting in bed at the time of assessment, sleeping. No family present at bedside. TF infusing at goal via cortrak tube. Discussed with RN, states that pt is doing well with new formula, no excessive BM reported. Will continue current regimen at this time. Did note that weights recorded over the last few days are significantly different than what was entered on admission, will request new weight to clarify what is accurate.  Nutritionally Relevant Medications: Scheduled Meds:  doxazosin  1 mg Per Tube Daily   famotidine  20 mg Per Tube BID   JEVITY 1.5  CAL/FIBER  1,000 mL Per Tube Q24H   PROSource TF  45 mL Per Tube Daily   free water  120 mL Per Tube Q4H   mirtazapine  7.5 mg Per Tube QHS   JUVEN  1 packet Per Tube BID BM   PRN Meds: alum & mag hydroxide-simeth  Labs Reviewed: BUN 38 CBG ranges from 117-151 mg/dL over the last 24 hours  NUTRITION - FOCUSED PHYSICAL EXAM: Flowsheet Row Most Recent Value  Orbital Region Severe depletion  Upper Arm Region Severe depletion  Thoracic and Lumbar Region Severe depletion  Buccal Region Severe depletion  Temple Region Moderate depletion  Clavicle Bone Region Severe depletion  Clavicle and Acromion Bone Region Severe depletion  Scapular Bone Region Severe depletion  Dorsal Hand Severe depletion  Patellar Region Severe depletion  Anterior Thigh Region Severe depletion  Posterior Calf Region Severe depletion  Edema (RD Assessment) None  Hair Reviewed  Eyes Reviewed  Mouth Reviewed  Skin Reviewed  Nails Reviewed   Diet Order:   Diet Order             Diet NPO time specified  Diet effective now                  EDUCATION NEEDS:  Education needs have been addressed (with pt's daughter)  Skin:  Skin Assessment: Reviewed RN Assessment (ecchymosis, DTI buttocks)  Last BM:  6/14  Height:  Ht Readings from Last 1 Encounters:  01/25/22 '5\' 11"'$  (1.803 m)   Weight:  Wt Readings from Last 1 Encounters:  02/04/22 115.2 kg   Ideal Body Weight:  78 kg  BMI:  Body mass index is  35.43 kg/m.  Estimated Nutritional Needs:  Kcal:  1900-2200kcal/day Protein:  95-110g/day Fluid:  1.9-2.2L/day   Ranell Patrick, RD, LDN Clinical Dietitian RD pager # available in [facility]  After hours/weekend pager # available in [facility]

## 2022-02-07 NOTE — Progress Notes (Signed)
PROGRESS NOTE  [name]  DOB: [birth year]  PCP: [name], DO AVW:[id number]  DOA: 01/24/2022  LOS: 24 days  Hospital Day: 15  Brief narrative: [name] is a 88 y.o. male with PMH significant for DM2, HTN, HLD, chronic systolic CHF, paroxysmal A-fib, SSS  s/p PPM, CAD/CABG, chronic thrombocytopenia, recurrent UTI, OSA not on CPAP, dysphagia with PEG tube in place. Patient has chronic dysphagia and underwent PEG tube placement on 5/24. Patient was brought to the ED on 01/24/2022 with complaint of malodorous and purulent discharge from feeding tube site.  6/3, G-tube was upsized.  General surgery recommended keeping g tube to gravity to ensure no pressure on skin and balloon not pulled up against intraabdominal wall.  He currently has a cortrak for post pyloric tube feeds.   Difficult disposition at this point as he's going to need cortrak for nutrition while g tube is to gravity.    Subjective: Patient was seen and examined this morning. Not in distress.  Family not at bedside.  Core track feeding ongoing.  Principal Problem:   Cellulitis Active Problems:   Hypotension   Paroxysmal atrial fibrillation (HCC)   HFrEF (heart failure with reduced ejection fraction) (HCC)   C6 cervical fracture (HCC)   Controlled type 2 diabetes mellitus without complication, without long-term current use of insulin (HCC)   Delirium   S/P CABG x 3   Hypernatremia   Dysphagia   Anemia   Urinary retention   Diarrhea   Abnormal LFTs   Pressure ulcer   Goals of care, counseling/discussion   Status post insertion of percutaneous endoscopic gastrostomy (PEG) tube (HCC)   Hematuria    Assessment and plan: Pressure wound related to G-tube Chronic dysphagia -Pt has dysphagia and is s/p PEG tube placement on 5/24.   -Presented with malodor and purulent discharge from PEG tube site. -CT abd/pelvis showed 6.4x2.2 cm area of inflammatory/infectious phlegmon in subcutaneous plaine at site  of placement of gastrostomy, pockets of air in the subcutaneous plane at the site of g tube placement (due to open wound in skin or suggest infection with gas producing organisms).  No definite demonstrable thick walled loculated fluid collection. -6/3, underwent fluoroscopic guided replacement and up sizing of now 24 fr ballon retention gastrostomy tube by IR. Per Dr. Pascal Lux, suspected pressure ulcer about medial aspect of g tube insertion - likely due to insertion angle of tube and external retention disc being too tight. -General surgery consultation was obtained.  Recommended local wound care, postpyloric nutrition, G-tube to gravity. -Patient has prolonged hospitalization because of continued need of core track tube feeding while the PEG tube site wound heals over the course of several days. -Last note by general surgery on 6/8 he states that if the wound is not drinking over the next 1 to 2 weeks, other considerations will need to be entertained.  We will ask general surgery for follow-up tomorrow. -Completed a course of IV antibiotics.  Severe protein calorie malnutrition -Low albumin level.  Augment nutrition. Recent Labs  Lab 02/03/22 0725  ALBUMIN 1.5*   Chronic systolic CHF Hypotension -Presented with hypotension with SBP of 90 over 50s.  This has improved with fluid but remains soft.  -PTA, patient was on Lasix, Entresto, Aldactone and beta-blockers at home.  Currently on hold.  Blood pressure stable. -Echocardiogram 6/5 with EF stable at 40 to 45%  CAD/CABG -No cardiac symptoms at this time.  Paroxysmal atrial fibrillation -Rate controlled on amiodarone.   -  Eliquis for anticoagulation.  Type 2 diabetes mellitus -A1c 5.2 on 01/06/2022 -Currently controlled on sliding scale insulin with Accu-Cheks.   Recent Labs  Lab 02/06/22 1723 02/06/22 2359 02/07/22 0545 02/07/22 1204 02/07/22 1628  GLUCAP 151* 140* 136* 137* 139*     Acute on chronic anemia Hematuria History of  nephrolithiasis -Patient has anemia of chronic disease with hemoglobin mostly more than 9. -6/13, patient was noted to have hematuria. -Renal ultrasound did not show any evidence of hydronephrosis or stone. -Foley catheter was inserted and irrigated. -Continue to monitor for hematuria. -Hemoglobin stable.  Continue to monitor.  Transfuse if less than 7. Recent Labs    01/06/22 1840 01/09/22 0330 01/17/22 0550 01/18/22 0323 01/26/22 0037 01/27/22 0313 02/03/22 0725 02/04/22 1138 02/05/22 0022 02/06/22 0032 02/07/22 0254  HGB  --    < > 9.2*   < > 7.2*   < > 8.3* 7.4* 7.4* 7.8* 7.8*  MCV  --    < > 97.0   < > 100.4*   < > 100.8* 99.6 98.8 100.4* 99.6  VITAMINB12 366  --  507  --  381  --   --   --   --   --   --   FOLATE  --   --  11.3  --  10.0  --   --   --   --   --   --   FERRITIN  --   --  134  --  591*  --   --   --   --   --   --   TIBC  --   --  203*  --  169*  --   --   --   --   --   --   IRON  --   --  32*  --  31*  --   --   --   --   --   --   RETICCTPCT  --   --  1.6  --   --   --   --   --   --   --   --    < > = values in this interval not displayed.   Recent C6 cervical fracture  -After a ground-level fall in April after which she was placed on a soft cervical collar and seen by neurosurgery.  Previous hospitalist discussed with neurosurgeon Dr. Ellene Hunter who recommended to remove cervical collar. -Activity level as tolerated.    Goals of care, counseling/discussion -Family not interested in palliative care discussions.  Goals of care   Code Status: Full Code    Mobility: Last seen by PT today 6/16.  Home with PT OT are recommended.  Skin assessment: Pressure ulcer as below Pressure Injury 01/24/22 Buttocks Deep Tissue Pressure Injury - Purple or maroon localized area of discolored intact skin or blood-filled blister due to damage of underlying soft tissue from pressure and/or shear. (Active)  01/24/22 2036  Location: Buttocks  Location Orientation:    Staging: Deep Tissue Pressure Injury - Purple or maroon localized area of discolored intact skin or blood-filled blister due to damage of underlying soft tissue from pressure and/or shear.  Wound Description (Comments):   Present on Admission: Yes    Nutritional status:  Body mass index is 35.43 kg/m.  Nutrition Problem: Severe Malnutrition (in the context of chronic illness) Etiology: dysphagia Signs/Symptoms: severe fat depletion, severe muscle depletion     Diet:  Diet Order  Diet NPO time specified  Diet effective now                   DVT prophylaxis: SCDs Place and maintain sequential compression device Start: 02/07/22 1725   Antimicrobials: None Fluid: None Consultants: None Family Communication: None at bedside  Status is: Inpatient  Continue in-hospital care because: G-tube to gravity.  Healing G-tube site wound Level of care: Telemetry Medical   Dispo: The patient is from: Home              Anticipated d/c is to: Pending clinical course              Patient currently is not medically stable to d/c.   Difficult to place patient No     Infusions:    Scheduled Meds:  acetaminophen  1,000 mg Per Tube Q8H   amiodarone  200 mg Per Tube Daily   Chlorhexidine Gluconate Cloth  6 each Topical Q0600   doxazosin  1 mg Per Tube Daily   famotidine  20 mg Per Tube BID   feeding supplement (JEVITY 1.5 CAL/FIBER)  1,000 mL Per Tube Q24H   feeding supplement (PROSource TF)  45 mL Per Tube Daily   fluticasone furoate-vilanterol  1 puff Inhalation Daily   free water  120 mL Per Tube Q4H   leptospermum manuka honey  1 application  Topical BID   mirtazapine  7.5 mg Per Tube QHS   nutrition supplement (JUVEN)  1 packet Per Tube BID BM   venlafaxine  18.75 mg Per Tube BID WC   Zinc Oxide   Topical BID    PRN meds: alum & mag hydroxide-simeth, diphenoxylate-atropine, morphine injection, oxyCODONE   Antimicrobials: Anti-infectives (From admission,  onward)    Start     Dose/Rate Hunter Frequency Ordered Stop   01/31/22 1000  fluconazole (DIFLUCAN) tablet 200 mg        200 mg Per Tube Daily 01/30/22 1253 02/04/22 0959   01/30/22 2200  ceFAZolin (ANCEF) IVPB 2g/100 mL premix        2 g 200 mL/hr over 30 Minutes Intravenous Every 8 hours 01/30/22 1253 02/02/22 1535   01/30/22 0200  vancomycin (VANCOREADY) IVPB 1500 mg/300 mL  Status:  Discontinued        1,500 mg 150 mL/hr over 120 Minutes Intravenous  Once 01/30/22 0358 01/30/22 1248   01/30/22 0200  meropenem (MERREM) 1 g in sodium chloride 0.9 % 100 mL IVPB  Status:  Discontinued        1 g 200 mL/hr over 30 Minutes Intravenous  Once 01/30/22 0358 01/30/22 1248   01/27/22 1500  ceFAZolin (ANCEF) IVPB 2g/100 mL premix  Status:  Discontinued        2 g 200 mL/hr over 30 Minutes Intravenous Every 8 hours 01/27/22 1411 01/30/22 0357   01/27/22 1400  cefTRIAXone (ROCEPHIN) 2 g in sodium chloride 0.9 % 100 mL IVPB  Status:  Discontinued        2 g 200 mL/hr over 30 Minutes Intravenous Every 24 hours 01/27/22 1002 01/27/22 1347   01/27/22 1000  fluconazole (DIFLUCAN) tablet 100 mg  Status:  Discontinued        100 mg Per Tube Daily 01/26/22 0353 01/30/22 1253   01/26/22 1000  fluconazole (DIFLUCAN) 40 MG/ML suspension 100 mg  Status:  Discontinued       See Hyperspace for full Linked Orders Report.   100 mg Per Tube Daily 01/25/22 1833 01/26/22 0353  01/26/22 0500  fluconazole (DIFLUCAN) IVPB 200 mg        200 mg 100 mL/hr over 60 Minutes Intravenous  Once 01/26/22 0353 01/26/22 0533   01/25/22 1930  fluconazole (DIFLUCAN) 40 MG/ML suspension 200 mg  Status:  Discontinued       See Hyperspace for full Linked Orders Report.   200 mg Per Tube  Once 01/25/22 1833 01/26/22 0353   01/25/22 1800  vancomycin (VANCOCIN) IVPB 1000 mg/200 mL premix  Status:  Discontinued        1,000 mg 200 mL/hr over 60 Minutes Intravenous Every 24 hours 01/24/22 2041 01/27/22 1347   01/24/22 2230  meropenem  (MERREM) 1 g in sodium chloride 0.9 % 100 mL IVPB  Status:  Discontinued        1 g 200 mL/hr over 30 Minutes Intravenous Every 8 hours 01/24/22 2134 01/27/22 1002   01/24/22 1800  vancomycin (VANCOCIN) IVPB 1000 mg/200 mL premix        1,000 mg 200 mL/hr over 60 Minutes Intravenous  Once 01/24/22 1757 01/24/22 1952       Objective: Vitals:   02/07/22 0749 02/07/22 1632  BP: (!) 130/57 114/67  Pulse: 65 79  Resp: 18 17  Temp: 98 F (36.7 C) 97.7 F (36.5 C)  SpO2: 100% 98%    Intake/Output Summary (Last 24 hours) at 02/07/2022 1726 Last data filed at 02/07/2022 1715 Gross per 24 hour  Intake 0 ml  Output 1800 ml  Net -1800 ml   Filed Weights   02/02/22 0500 02/03/22 0500 02/04/22 0741  Weight: 115.2 kg 117 kg 115.2 kg   Weight change:  Body mass index is 35.43 kg/m.   Physical Exam: General exam: Pleasant elderly Caucasian male.  Not in physical distress Skin: No rashes, lesions or ulcers. HEENT: Atraumatic, normocephalic, no obvious bleeding Lungs: Clear to auscultation bilaterally CVS: Regular rate and rhythm, no murmur GI/Abd soft, nontender, nondistended, bowel sound..  PEG tube site with dressing on CNS: Alert, awake, muffled speech. Psychiatry: Sad affect Extremities: No pedal edema, no calf tenderness  Data Review: I have personally reviewed the laboratory data and studies available.  F/u labs ordered Unresulted Labs (From admission, onward)     Start     Ordered   02/08/22 0500  CBC with Differential/Platelet  Tomorrow morning,   R       Question:  Specimen collection method  Answer:  Lab=Lab collect   02/07/22 1725   02/08/22 1607  Basic metabolic panel  Tomorrow morning,   R       Question:  Specimen collection method  Answer:  Lab=Lab collect   02/07/22 1725            Signed, Terrilee Croak, MD [facility] 02/07/2022

## 2022-02-08 ENCOUNTER — Other Ambulatory Visit: Payer: Self-pay | Admitting: Family Medicine

## 2022-02-08 DIAGNOSIS — L03818 Cellulitis of other sites: Secondary | ICD-10-CM | POA: Diagnosis not present

## 2022-02-08 DIAGNOSIS — E1169 Type 2 diabetes mellitus with other specified complication: Secondary | ICD-10-CM

## 2022-02-08 LAB — CBC WITH DIFFERENTIAL/PLATELET
Abs Immature Granulocytes: 0.08 10*3/uL — ABNORMAL HIGH (ref 0.00–0.07)
Basophils Absolute: 0 10*3/uL (ref 0.0–0.1)
Basophils Relative: 0 %
Eosinophils Absolute: 0 10*3/uL (ref 0.0–0.5)
Eosinophils Relative: 0 %
HCT: 24.9 % — ABNORMAL LOW (ref 39.0–52.0)
Hemoglobin: 7.5 g/dL — ABNORMAL LOW (ref 13.0–17.0)
Immature Granulocytes: 1 %
Lymphocytes Relative: 26 %
Lymphs Abs: 1.5 10*3/uL (ref 0.7–4.0)
MCH: 30.9 pg (ref 26.0–34.0)
MCHC: 30.1 g/dL (ref 30.0–36.0)
MCV: 102.5 fL — ABNORMAL HIGH (ref 80.0–100.0)
Monocytes Absolute: 0.6 10*3/uL (ref 0.1–1.0)
Monocytes Relative: 10 %
Neutro Abs: 3.6 10*3/uL (ref 1.7–7.7)
Neutrophils Relative %: 63 %
Platelets: 121 10*3/uL — ABNORMAL LOW (ref 150–400)
RBC: 2.43 MIL/uL — ABNORMAL LOW (ref 4.22–5.81)
RDW: 20.8 % — ABNORMAL HIGH (ref 11.5–15.5)
WBC: 5.9 10*3/uL (ref 4.0–10.5)
nRBC: 0 % (ref 0.0–0.2)

## 2022-02-08 LAB — BASIC METABOLIC PANEL
Anion gap: 6 (ref 5–15)
BUN: 41 mg/dL — ABNORMAL HIGH (ref 8–23)
CO2: 24 mmol/L (ref 22–32)
Calcium: 8.7 mg/dL — ABNORMAL LOW (ref 8.9–10.3)
Chloride: 102 mmol/L (ref 98–111)
Creatinine, Ser: 0.64 mg/dL (ref 0.61–1.24)
GFR, Estimated: >60 mL/min (ref >60)
Glucose, Bld: 136 mg/dL — ABNORMAL HIGH (ref 70–99)
Potassium: 4.5 mmol/L (ref 3.5–5.1)
Sodium: 132 mmol/L — ABNORMAL LOW (ref 135–145)

## 2022-02-08 LAB — GLUCOSE, CAPILLARY
Glucose-Capillary: 117 mg/dL — ABNORMAL HIGH (ref 70–99)
Glucose-Capillary: 136 mg/dL — ABNORMAL HIGH (ref 70–99)
Glucose-Capillary: 148 mg/dL — ABNORMAL HIGH (ref 70–99)
Glucose-Capillary: 151 mg/dL — ABNORMAL HIGH (ref 70–99)

## 2022-02-08 NOTE — Progress Notes (Signed)
PROGRESS NOTE  FREELAND PRACHT  DOB: 04-22-1933  PCP: [name], DO LNL:[id number]  DOA: 01/24/2022  LOS: 26 days  Hospital Day: 16  Brief narrative: [name] is a 88 y.o. male with PMH significant for DM2, HTN, HLD, chronic systolic CHF, paroxysmal A-fib, SSS  s/p PPM, CAD/CABG, chronic thrombocytopenia, recurrent UTI, OSA not on CPAP, dysphagia with PEG tube in place. Patient has chronic dysphagia and underwent PEG tube placement on 5/24. Patient was brought to the ED on 01/24/2022 with complaint of malodorous and purulent discharge from feeding tube site.  6/3, G-tube was upsized.  General surgery recommended keeping g tube to gravity to ensure no pressure on skin and balloon not pulled up against intraabdominal wall.  He currently has a cortrak for post pyloric tube feeds.   Difficult disposition at this point as he's going to need cortrak for nutrition while g tube is to gravity.    Subjective: Patient was seen and examined this morning. Elderly male.  Looks very weak.  Propped up in bed.  Not in distress.  Muffled voice.  Family not at bedside.  G-tube draining to gravity.  Principal Problem:   Cellulitis Active Problems:   Hypotension   Paroxysmal atrial fibrillation (HCC)   HFrEF (heart failure with reduced ejection fraction) (HCC)   C6 cervical fracture (HCC)   Controlled type 2 diabetes mellitus without complication, without long-term current use of insulin (HCC)   Delirium   S/P CABG x 3   Hypernatremia   Dysphagia   Anemia   Urinary retention   Diarrhea   Abnormal LFTs   Pressure ulcer   Goals of care, counseling/discussion   Status post insertion of percutaneous endoscopic gastrostomy (PEG) tube (HCC)   Hematuria    Assessment and plan: Pressure wound related to G-tube Chronic dysphagia -Pt has dysphagia and is s/p PEG tube placement on 5/24.   -Presented with malodor and purulent discharge from PEG tube site. -CT abd/pelvis showed 6.4x2.2 cm  area of inflammatory/infectious phlegmon in subcutaneous plaine at site of placement of gastrostomy, pockets of air in the subcutaneous plane at the site of g tube placement (due to open wound in skin or suggest infection with gas producing organisms).  No definite demonstrable thick walled loculated fluid collection. -6/3, underwent fluoroscopic guided replacement and up sizing of now 24 fr ballon retention gastrostomy tube by IR. Per Dr. Pascal Lux, suspected pressure ulcer about medial aspect of g tube insertion - likely due to insertion angle of tube and external retention disc being too tight. -General surgery consultation was obtained.  Recommended local wound care, postpyloric nutrition, G-tube to gravity. -Patient has prolonged hospitalization because of continued need of core track tube feeding while the PEG tube site wound heals over the course of several days. -Last note by general surgery on 6/8 he states that if the wound is not improving over the next 1 to 2 weeks, other considerations will need to be entertained.  I asked general surgery for follow-up today. -Completed a course of IV antibiotics.  Severe protein calorie malnutrition -Low albumin level.  Augment nutrition. Recent Labs  Lab 02/03/22 0725  ALBUMIN 1.5*   Chronic systolic CHF Hypotension -Presented with hypotension with SBP of 90 over 50s.  This has improved with fluid but remains soft.  -PTA, patient was on Lasix, Entresto, Aldactone and beta-blockers at home.  Currently on hold.  Blood pressure stable. -Echocardiogram 6/5 with EF stable at 40 to 45%  CAD/CABG -No cardiac  symptoms at this time.  Paroxysmal atrial fibrillation -Rate controlled on amiodarone.   -Eliquis for anticoagulation.  Type 2 diabetes mellitus -A1c 5.2 on 01/06/2022 -Currently controlled on sliding scale insulin with Accu-Cheks.   Recent Labs  Lab 02/07/22 0545 02/07/22 1204 02/07/22 1628 02/07/22 2348 02/08/22 0557  GLUCAP 136* 137*  139* 139* 148*     Acute on chronic anemia Hematuria History of nephrolithiasis -Patient has anemia of chronic disease with hemoglobin mostly more than 9. -6/13, patient was noted to have hematuria. -Renal ultrasound did not show any evidence of hydronephrosis or stone. -Foley catheter was inserted and irrigated. -Continue to monitor for hematuria. -Hemoglobin stable.  Continue to monitor.  Transfuse if less than 7. Recent Labs    01/06/22 1840 01/09/22 0330 01/17/22 0550 01/18/22 0323 01/26/22 0037 01/27/22 0313 02/04/22 1138 02/05/22 0022 02/06/22 0032 02/07/22 0254 02/08/22 0058  HGB  --    < > 9.2*   < > 7.2*   < > 7.4* 7.4* 7.8* 7.8* 7.5*  MCV  --    < > 97.0   < > 100.4*   < > 99.6 98.8 100.4* 99.6 102.5*  VITAMINB12 366  --  507  --  381  --   --   --   --   --   --   FOLATE  --   --  11.3  --  10.0  --   --   --   --   --   --   FERRITIN  --   --  134  --  591*  --   --   --   --   --   --   TIBC  --   --  203*  --  169*  --   --   --   --   --   --   IRON  --   --  32*  --  31*  --   --   --   --   --   --   RETICCTPCT  --   --  1.6  --   --   --   --   --   --   --   --    < > = values in this interval not displayed.   Recent C6 cervical fracture  -After a ground-level fall in April after which she was placed on a soft cervical collar and seen by neurosurgery.   -6/6, neurosurgeon Dr. Ellene Route recommended to remove cervical collar. -Activity level as tolerated.    Goals of care, counseling/discussion -Family not interested in palliative care discussions.  Goals of care   Code Status: Full Code    Mobility: Last seen by PT today 6/16.  Home with PT OT are recommended.  Skin assessment: Pressure ulcer as below Pressure Injury 01/24/22 Buttocks Deep Tissue Pressure Injury - Purple or maroon localized area of discolored intact skin or blood-filled blister due to damage of underlying soft tissue from pressure and/or shear. (Active)  01/24/22 2036  Location:  Buttocks  Location Orientation:   Staging: Deep Tissue Pressure Injury - Purple or maroon localized area of discolored intact skin or blood-filled blister due to damage of underlying soft tissue from pressure and/or shear.  Wound Description (Comments):   Present on Admission: Yes    Nutritional status:  Body mass index is 25.66 kg/m.  Nutrition Problem: Severe Malnutrition (in the context of chronic illness) Etiology: dysphagia Signs/Symptoms: severe fat depletion, severe muscle  depletion     Diet:  Diet Order             Diet NPO time specified  Diet effective now                   DVT prophylaxis: SCDs Place and maintain sequential compression device Start: 02/07/22 1725   Antimicrobials: None Fluid: None Consultants: None Family Communication: None at bedside  Status is: Inpatient  Continue in-hospital care because: G-tube to gravity.  Healing G-tube site wound.  To be followed by general surgery Level of care: Telemetry Medical   Dispo: The patient is from: Home              Anticipated d/c is to: Pending clinical course              Patient currently is not medically stable to d/c.   Difficult to place patient No     Infusions:    Scheduled Meds:  acetaminophen  1,000 mg Per Tube Q8H   amiodarone  200 mg Per Tube Daily   Chlorhexidine Gluconate Cloth  6 each Topical Q0600   doxazosin  1 mg Per Tube Daily   famotidine  20 mg Per Tube BID   feeding supplement (JEVITY 1.5 CAL/FIBER)  1,000 mL Per Tube Q24H   feeding supplement (PROSource TF)  45 mL Per Tube Daily   fluticasone furoate-vilanterol  1 puff Inhalation Daily   free water  120 mL Per Tube Q4H   leptospermum manuka honey  1 application  Topical BID   mirtazapine  7.5 mg Per Tube QHS   nutrition supplement (JUVEN)  1 packet Per Tube BID BM   venlafaxine  18.75 mg Per Tube BID WC   Zinc Oxide   Topical BID    PRN meds: alum & mag hydroxide-simeth, diphenoxylate-atropine, morphine  injection, oxyCODONE   Antimicrobials: Anti-infectives (From admission, onward)    Start     Dose/Rate Route Frequency Ordered Stop   01/31/22 1000  fluconazole (DIFLUCAN) tablet 200 mg        200 mg Per Tube Daily 01/30/22 1253 02/04/22 0959   01/30/22 2200  ceFAZolin (ANCEF) IVPB 2g/100 mL premix        2 g 200 mL/hr over 30 Minutes Intravenous Every 8 hours 01/30/22 1253 02/02/22 1535   01/30/22 0200  vancomycin (VANCOREADY) IVPB 1500 mg/300 mL  Status:  Discontinued        1,500 mg 150 mL/hr over 120 Minutes Intravenous  Once 01/30/22 0358 01/30/22 1248   01/30/22 0200  meropenem (MERREM) 1 g in sodium chloride 0.9 % 100 mL IVPB  Status:  Discontinued        1 g 200 mL/hr over 30 Minutes Intravenous  Once 01/30/22 0358 01/30/22 1248   01/27/22 1500  ceFAZolin (ANCEF) IVPB 2g/100 mL premix  Status:  Discontinued        2 g 200 mL/hr over 30 Minutes Intravenous Every 8 hours 01/27/22 1411 01/30/22 0357   01/27/22 1400  cefTRIAXone (ROCEPHIN) 2 g in sodium chloride 0.9 % 100 mL IVPB  Status:  Discontinued        2 g 200 mL/hr over 30 Minutes Intravenous Every 24 hours 01/27/22 1002 01/27/22 1347   01/27/22 1000  fluconazole (DIFLUCAN) tablet 100 mg  Status:  Discontinued        100 mg Per Tube Daily 01/26/22 0353 01/30/22 1253   01/26/22 1000  fluconazole (DIFLUCAN) 40 MG/ML suspension 100 mg  Status:  Discontinued       See Hyperspace for full Linked Orders Report.   100 mg Per Tube Daily 01/25/22 1833 01/26/22 0353   01/26/22 0500  fluconazole (DIFLUCAN) IVPB 200 mg        200 mg 100 mL/hr over 60 Minutes Intravenous  Once 01/26/22 0353 01/26/22 0533   01/25/22 1930  fluconazole (DIFLUCAN) 40 MG/ML suspension 200 mg  Status:  Discontinued       See Hyperspace for full Linked Orders Report.   200 mg Per Tube  Once 01/25/22 1833 01/26/22 0353   01/25/22 1800  vancomycin (VANCOCIN) IVPB 1000 mg/200 mL premix  Status:  Discontinued        1,000 mg 200 mL/hr over 60 Minutes  Intravenous Every 24 hours 01/24/22 2041 01/27/22 1347   01/24/22 2230  meropenem (MERREM) 1 g in sodium chloride 0.9 % 100 mL IVPB  Status:  Discontinued        1 g 200 mL/hr over 30 Minutes Intravenous Every 8 hours 01/24/22 2134 01/27/22 1002   01/24/22 1800  vancomycin (VANCOCIN) IVPB 1000 mg/200 mL premix        1,000 mg 200 mL/hr over 60 Minutes Intravenous  Once 01/24/22 1757 01/24/22 1952       Objective: Vitals:   02/08/22 0834 02/08/22 0901  BP:  (!) 125/46  Pulse:  68  Resp:  18  Temp:  97.6 F (36.4 C)  SpO2: 97% 100%    Intake/Output Summary (Last 24 hours) at 02/08/2022 1036 Last data filed at 02/07/2022 2015 Gross per 24 hour  Intake 0 ml  Output 400 ml  Net -400 ml   Filed Weights   02/03/22 0500 02/04/22 0741 02/08/22 0330  Weight: 117 kg 115.2 kg 83.5 kg   Weight change:  Body mass index is 25.66 kg/m.   Physical Exam: General exam: Pleasant elderly Caucasian male.  Looks significantly weak.  Not in pain Skin: No rashes, lesions or ulcers. HEENT: Atraumatic, normocephalic, no obvious bleeding Lungs: Clear to auscultation bilaterally CVS: Regular rate and rhythm, no murmur GI/Abd soft, nontender, nondistended, bowel sound..  PEG tube site with dressing on CNS: Alert, awake, muffled speech. Psychiatry: Sad affect Extremities: No pedal edema, no calf tenderness  Data Review: I have personally reviewed the laboratory data and studies available.  F/u labs ordered Unresulted Labs (From admission, onward)     Start     Ordered   02/09/22 0500  CBC with Differential/Platelet  Tomorrow morning,   R       Question:  Specimen collection method  Answer:  Lab=Lab collect   02/08/22 1036   02/09/22 7673  Basic metabolic panel  Tomorrow morning,   R       Question:  Specimen collection method  Answer:  Lab=Lab collect   02/08/22 1036            Signed, Terrilee Croak, MD [facility] 02/08/2022

## 2022-02-08 NOTE — Progress Notes (Signed)
Patient ID: [name], male   DOB: August 09, 1933, 88 y.o.   MRN: [id number] Asked to re-evaluate G tube site pressure wound. Wound has Aquacel Ag over the open part covered with non-adherant dressings. I removed these and the wound is very clean. No cellulitis. I replaced the specialized dressings. Continue wound care for now. This will take some time to heal.  Georganna Skeans, MD, MPH, FACS Please use [email or web address] to contact on call provider

## 2022-02-08 NOTE — Progress Notes (Signed)
Mobility Specialist Progress Note:   02/08/22 1125  Mobility  Activity Transferred from bed to chair  Level of Assistance +2 (takes two people)  Assistive Device MaxiMove  Activity Response Tolerated fair  $Mobility charge 1 Mobility   Pt agreeable to mobility session today. Focused on transferring to chair. Required maxA+2 to turn to place lift pad under pt. Pt tolerated transfer fair, c/o R knee pain. Pt left with all needs met, chair alarm on.  [name] Acute Rehab Secure Chat or Office Phone: [phone]

## 2022-02-09 DIAGNOSIS — L03818 Cellulitis of other sites: Secondary | ICD-10-CM | POA: Diagnosis not present

## 2022-02-09 LAB — CBC WITH DIFFERENTIAL/PLATELET
Abs Immature Granulocytes: 0.08 10*3/uL — ABNORMAL HIGH (ref 0.00–0.07)
Basophils Absolute: 0 10*3/uL (ref 0.0–0.1)
Basophils Relative: 0 %
Eosinophils Absolute: 0 10*3/uL (ref 0.0–0.5)
Eosinophils Relative: 0 %
HCT: 22.9 % — ABNORMAL LOW (ref 39.0–52.0)
Hemoglobin: 7.1 g/dL — ABNORMAL LOW (ref 13.0–17.0)
Immature Granulocytes: 2 %
Lymphocytes Relative: 27 %
Lymphs Abs: 1.2 10*3/uL (ref 0.7–4.0)
MCH: 31.3 pg (ref 26.0–34.0)
MCHC: 31 g/dL (ref 30.0–36.0)
MCV: 100.9 fL — ABNORMAL HIGH (ref 80.0–100.0)
Monocytes Absolute: 0.7 10*3/uL (ref 0.1–1.0)
Monocytes Relative: 16 %
Neutro Abs: 2.5 10*3/uL (ref 1.7–7.7)
Neutrophils Relative %: 55 %
Platelets: 118 10*3/uL — ABNORMAL LOW (ref 150–400)
RBC: 2.27 MIL/uL — ABNORMAL LOW (ref 4.22–5.81)
RDW: 20.6 % — ABNORMAL HIGH (ref 11.5–15.5)
WBC: 4.5 10*3/uL (ref 4.0–10.5)
nRBC: 0 % (ref 0.0–0.2)

## 2022-02-09 LAB — PREPARE RBC (CROSSMATCH)

## 2022-02-09 LAB — HEMOGLOBIN AND HEMATOCRIT, BLOOD
HCT: 22.3 % — ABNORMAL LOW (ref 39.0–52.0)
HCT: 24.7 % — ABNORMAL LOW (ref 39.0–52.0)
Hemoglobin: 7 g/dL — ABNORMAL LOW (ref 13.0–17.0)
Hemoglobin: 7.6 g/dL — ABNORMAL LOW (ref 13.0–17.0)

## 2022-02-09 LAB — GLUCOSE, CAPILLARY: Glucose-Capillary: 113 mg/dL — ABNORMAL HIGH (ref 70–99)

## 2022-02-09 LAB — BASIC METABOLIC PANEL
Anion gap: 8 (ref 5–15)
BUN: 46 mg/dL — ABNORMAL HIGH (ref 8–23)
CO2: 27 mmol/L (ref 22–32)
Calcium: 9.1 mg/dL (ref 8.9–10.3)
Chloride: 103 mmol/L (ref 98–111)
Creatinine, Ser: 0.63 mg/dL (ref 0.61–1.24)
GFR, Estimated: >60 mL/min (ref >60)
Glucose, Bld: 131 mg/dL — ABNORMAL HIGH (ref 70–99)
Potassium: 4.7 mmol/L (ref 3.5–5.1)
Sodium: 138 mmol/L (ref 135–145)

## 2022-02-09 MED ORDER — SODIUM CHLORIDE 0.9% IV SOLUTION
Freq: Once | INTRAVENOUS | Status: AC
Start: 2022-02-09 — End: 2022-02-09

## 2022-02-09 NOTE — Progress Notes (Signed)
Patient is alert but disoriented x 4. Admitted for cellulitis. Patient has a PEG tube for nutritional purposes, a total assist, currently on tele and room air. I informed Rufina Falco that the patient is urinating dark red blood from his foley catheter. Hemoglobin has gone from 7.8-7.1. Patient is in no distress. Bed is at its lowest position and locked. Call bell within reach. Will continue to monitor.

## 2022-02-09 NOTE — Progress Notes (Addendum)
  BRIEF OVERNIGHT CROSS COVERING REPORT  NAME:  [name], MRN:  [id number], DOB:  06-Jun-1933, LOS: 60 ADMISSION DATE:  01/24/2022  Notified by patient's RN of continued gross hematuria with drop in hemoglobin from 7.8 to 7.1.   On review of vs, the temperature was 36.7C, the heart rate 95 beats/minute, the blood pressure 117/58 mm Hg, the respiratory rate 19 breaths/minute, and the oxygen saturation 98% on RA Pertinent Labs Diagnostics Findings: WBC/ TMAX: 4.5/ afebrile Hgb/Hct: 7.1/2.9 Plts: 118  Acute/Chronic Anemia without evidence of hemodynamic instability Gross Hematuria - At least 2x IV access, 18 gauge or larger - IVF resuscitation to maintain MAP>65 - Irrigate Foley as needed - H&H monitoring q6h - Blood Consent.  Transfuse PRN Hgb<7 - Hold anticoagulation/antiplatelets    Rufina Falco, DNP, CCRN, [name]-C, AGACNP-BC Acute Care & Family Nurse Practitioner  [facility]  See Amion for personal pager PCCM on call pager [phone] until 7 am

## 2022-02-09 NOTE — Progress Notes (Signed)
PROGRESS NOTE  [name]  DOB: 1933-04-19  PCP: Calvin Norlander, DO BZJ:[id number]  DOA: 01/24/2022  LOS: 45 days  Hospital Day: 72  Brief narrative: [name] is a 88 y.o. male with PMH significant for DM2, HTN, HLD, chronic systolic CHF, paroxysmal A-fib, SSS s/p PPM, CAD/CABG, chronic thrombocytopenia, recurrent UTI, OSA not on CPAP, dysphagia with PEG tube in place. Patient has chronic dysphagia and underwent PEG tube placement by IR on 5/24. Patient was brought to the ED on 01/24/2022 with complaint of malodorous and purulent discharge from the PEG tube site.  CT abd/pelvis showed 6.4x2.2 cm area of inflammatory/infectious phlegmon in subcutaneous plaine at site of placement of gastrostomy, pockets of air in the subcutaneous plane at the site of g tube placement (due to open wound in skin or suggest infection with gas producing organisms).  No definite demonstrable thick walled loculated fluid collection. 6/3, underwent fluoroscopic guided replacement and up sizing of now 24 fr ballon retention gastrostomy tube by IR. Per Dr. Pascal Lux, suspected pressure ulcer about medial aspect of g tube insertion - likely due to insertion angle of tube and external retention disc being too tight. General surgery is following periodically. They recommended keeping G tube to gravity to ensure no pressure on skin and balloon not pulled up against intraabdominal wall.   He currently has a cortrak for post pyloric tube feeds.   Difficult disposition at this point as he's going to need cortrak for nutrition while G tube is to gravity.    Subjective: Patient was seen and examined this morning. Elderly male.  Propped up in bed.  Sleepy throughout my conversation.  Open eyes and answer few questions. Multiple family members at bedside including patient's daughter [name].  I had a long conversation with her at bedside.  Principal Problem:   Cellulitis Active Problems:   Hypotension   Paroxysmal  atrial fibrillation (HCC)   HFrEF (heart failure with reduced ejection fraction) (HCC)   C6 cervical fracture (HCC)   Controlled type 2 diabetes mellitus without complication, without long-term current use of insulin (HCC)   Delirium   S/P CABG x 3   Hypernatremia   Dysphagia   Anemia   Urinary retention   Diarrhea   Abnormal LFTs   Pressure ulcer   Goals of care, counseling/discussion   Status post insertion of percutaneous endoscopic gastrostomy (PEG) tube (HCC)   Hematuria    Assessment and plan: Pressure wound related to G-tube Chronic dysphagia -G-tube to gravity.  Continue local wound care.  Completed course of antibiotics -6/17, General surgery follow-up appreciated.  Wound gradually improving but PEG tube is not yet ready for use. -6/18, I met with his multiple family members this morning in the room including daughter [name] who is an Therapist, sports. While waiting for the G-tube wound to heal, this patient is getting weaker, sicker and losing his chance to improve. I wonder if the patient is a candidate for feeding jejunostomy.  If so, we could feed him through jejunostomy, remove his Cortrak and leave the G tube to drain for as long as it takes. This will be the only way he can get out of the hospital for good. I sent a message to IR and general surgery with that question.   Acute on chronic anemia Hematuria -Patient has anemia of chronic disease with hemoglobin mostly more than 9. -6/13, patient was noted to have hematuria.  Eliquis was stopped. -Renal ultrasound did not show any evidence of hydronephrosis  or stone. -Foley catheter was inserted and irrigated.  Patient continues to have mild hematuria mostly in last 24 hours. -He is getting frequent flushes.  Bladder scan did not show any retention.  No need of CVA at this time. -Hemoglobin gradually trended down as well as 7 today.  1 unit of PRBC transfusion was ordered.  Continue to monitor. Recent Labs    01/06/22 1840  01/09/22 0330 01/17/22 0550 01/18/22 0323 01/26/22 0037 01/27/22 0313 02/06/22 0032 02/07/22 0254 02/08/22 0058 02/09/22 0032 02/09/22 0610  HGB  --    < > 9.2*   < > 7.2*   < > 7.8* 7.8* 7.5* 7.1* 7.0*  MCV  --    < > 97.0   < > 100.4*   < > 100.4* 99.6 102.5* 100.9*  --   VITAMINB12 366  --  507  --  381  --   --   --   --   --   --   FOLATE  --   --  11.3  --  10.0  --   --   --   --   --   --   FERRITIN  --   --  134  --  591*  --   --   --   --   --   --   TIBC  --   --  203*  --  169*  --   --   --   --   --   --   IRON  --   --  32*  --  31*  --   --   --   --   --   --   RETICCTPCT  --   --  1.6  --   --   --   --   --   --   --   --    < > = values in this interval not displayed.   Acute metabolic encephalopathy -Patient is mostly sleepy throughout the day and also at night.  He opens eyes and answer few questions but is not much interactive. -Currently he is on Remeron 7.5 mg at bedtime, Effexor twice daily as well as as needed oxycodone, as needed morphine. -I would stop all of these mood altering agents and monitor mental status  Severe protein calorie malnutrition -Low albumin level.  Continue tube feeding Recent Labs  Lab 02/03/22 0725  ALBUMIN 1.5*   Chronic systolic CHF Hypotension -Presented with hypotension with SBP of 90 over 50s.  This has improved with fluid but remains soft.  -PTA, patient was on Lasix, Entresto, Aldactone and beta-blockers at home.  Currently on hold.  Blood pressure stable. -Echocardiogram 6/5 with EF stable at 40 to 45%  CAD/CABG -No cardiac symptoms at this time.  Paroxysmal atrial fibrillation -Rate controlled on amiodarone.   -Eliquis for anticoagulation.  Type 2 diabetes mellitus -A1c 5.2 on 01/06/2022 -Currently controlled on sliding scale insulin with Accu-Cheks.   Recent Labs  Lab 02/08/22 0557 02/08/22 1155 02/08/22 1721 02/08/22 2356 02/09/22 0550  GLUCAP 148* 117* 151* 136* 113*     Recent C6 cervical  fracture  -After a ground-level fall in April after which she was placed on a soft cervical collar and seen by neurosurgery.   -6/6, neurosurgeon Dr. Ellene Route recommended to remove cervical collar. -Activity level as tolerated.   Goals of care, counseling/discussion -Family not interested in palliative care discussions.  Goals of care   Code  Status: Full Code    Mobility: Last seen by PT today 6/16.  Home with PT OT are recommended.  Skin assessment: Pressure ulcer as below Pressure Injury 01/24/22 Buttocks Deep Tissue Pressure Injury - Purple or maroon localized area of discolored intact skin or blood-filled blister due to damage of underlying soft tissue from pressure and/or shear. (Active)  01/24/22 2036  Location: Buttocks  Location Orientation:   Staging: Deep Tissue Pressure Injury - Purple or maroon localized area of discolored intact skin or blood-filled blister due to damage of underlying soft tissue from pressure and/or shear.  Wound Description (Comments):   Present on Admission: Yes    Nutritional status:  Body mass index is 26.07 kg/m.  Nutrition Problem: Severe Malnutrition (in the context of chronic illness) Etiology: dysphagia Signs/Symptoms: severe fat depletion, severe muscle depletion     Diet:  Diet Order             Diet NPO time specified  Diet effective now                   DVT prophylaxis: SCDs Place and maintain sequential compression device Start: 02/07/22 1725   Antimicrobials: None Fluid: None Consultants: None Family Communication: Multiple family members at bedside  Status is: Inpatient  Continue in-hospital care because: G-tube to gravity.  Healing G-tube site wound.  To be followed by general surgery and IR. Level of care: Telemetry Medical   Dispo: The patient is from: Home              Anticipated d/c is to: Pending clinical course              Patient currently is not medically stable to d/c.   Difficult to place patient  No     Infusions:    Scheduled Meds:  acetaminophen  1,000 mg Per Tube Q8H   amiodarone  200 mg Per Tube Daily   Chlorhexidine Gluconate Cloth  6 each Topical Q0600   doxazosin  1 mg Per Tube Daily   famotidine  20 mg Per Tube BID   feeding supplement (JEVITY 1.5 CAL/FIBER)  1,000 mL Per Tube Q24H   feeding supplement (PROSource TF)  45 mL Per Tube Daily   fluticasone furoate-vilanterol  1 puff Inhalation Daily   free water  120 mL Per Tube Q4H   leptospermum manuka honey  1 application  Topical BID   nutrition supplement (JUVEN)  1 packet Per Tube BID BM   Zinc Oxide   Topical BID    PRN meds: alum & mag hydroxide-simeth, diphenoxylate-atropine   Antimicrobials: Anti-infectives (From admission, onward)    Start     Dose/Rate Route Frequency Ordered Stop   01/31/22 1000  fluconazole (DIFLUCAN) tablet 200 mg        200 mg Per Tube Daily 01/30/22 1253 02/04/22 0959   01/30/22 2200  ceFAZolin (ANCEF) IVPB 2g/100 mL premix        2 g 200 mL/hr over 30 Minutes Intravenous Every 8 hours 01/30/22 1253 02/02/22 1535   01/30/22 0200  vancomycin (VANCOREADY) IVPB 1500 mg/300 mL  Status:  Discontinued        1,500 mg 150 mL/hr over 120 Minutes Intravenous  Once 01/30/22 0358 01/30/22 1248   01/30/22 0200  meropenem (MERREM) 1 g in sodium chloride 0.9 % 100 mL IVPB  Status:  Discontinued        1 g 200 mL/hr over 30 Minutes Intravenous  Once 01/30/22 0358 01/30/22 1248  01/27/22 1500  ceFAZolin (ANCEF) IVPB 2g/100 mL premix  Status:  Discontinued        2 g 200 mL/hr over 30 Minutes Intravenous Every 8 hours 01/27/22 1411 01/30/22 0357   01/27/22 1400  cefTRIAXone (ROCEPHIN) 2 g in sodium chloride 0.9 % 100 mL IVPB  Status:  Discontinued        2 g 200 mL/hr over 30 Minutes Intravenous Every 24 hours 01/27/22 1002 01/27/22 1347   01/27/22 1000  fluconazole (DIFLUCAN) tablet 100 mg  Status:  Discontinued        100 mg Per Tube Daily 01/26/22 0353 01/30/22 1253   01/26/22 1000   fluconazole (DIFLUCAN) 40 MG/ML suspension 100 mg  Status:  Discontinued       See Hyperspace for full Linked Orders Report.   100 mg Per Tube Daily 01/25/22 1833 01/26/22 0353   01/26/22 0500  fluconazole (DIFLUCAN) IVPB 200 mg        200 mg 100 mL/hr over 60 Minutes Intravenous  Once 01/26/22 0353 01/26/22 0533   01/25/22 1930  fluconazole (DIFLUCAN) 40 MG/ML suspension 200 mg  Status:  Discontinued       See Hyperspace for full Linked Orders Report.   200 mg Per Tube  Once 01/25/22 1833 01/26/22 0353   01/25/22 1800  vancomycin (VANCOCIN) IVPB 1000 mg/200 mL premix  Status:  Discontinued        1,000 mg 200 mL/hr over 60 Minutes Intravenous Every 24 hours 01/24/22 2041 01/27/22 1347   01/24/22 2230  meropenem (MERREM) 1 g in sodium chloride 0.9 % 100 mL IVPB  Status:  Discontinued        1 g 200 mL/hr over 30 Minutes Intravenous Every 8 hours 01/24/22 2134 01/27/22 1002   01/24/22 1800  vancomycin (VANCOCIN) IVPB 1000 mg/200 mL premix        1,000 mg 200 mL/hr over 60 Minutes Intravenous  Once 01/24/22 1757 01/24/22 1952       Objective: Vitals:   02/09/22 1006 02/09/22 1007  BP: (!) 114/52 (!) 114/52  Pulse: 80 80  Resp: 18 18  Temp: 97.6 F (36.4 C) 97.6 F (36.4 C)  SpO2: 99% 99%    Intake/Output Summary (Last 24 hours) at 02/09/2022 1053 Last data filed at 02/09/2022 0509 Gross per 24 hour  Intake 0 ml  Output 3700 ml  Net -3700 ml   Filed Weights   02/04/22 0741 02/08/22 0330 02/09/22 0500  Weight: 115.2 kg 83.5 kg 84.8 kg   Weight change: 1.338 kg Body mass index is 26.07 kg/m.   Physical Exam: General exam: Pleasant elderly Caucasian male.  Looks significantly weak and sleepy. Skin: No rashes, lesions or ulcers. HEENT: Atraumatic, normocephalic, no obvious bleeding Lungs: Clear to auscultation bilaterally CVS: Regular rate and rhythm, no murmur GI/Abd soft, nontender, nondistended, bowel sound. PEG tube site with dressing on. CNS: Mostly sleepy.  Opens  eyes on verbal command and answers few questions. Psychiatry: Sad affect Extremities: No pedal edema, no calf tenderness  Data Review: I have personally reviewed the laboratory data and studies available.  F/u labs ordered Unresulted Labs (From admission, onward)     Start     Ordered   02/10/22 1829  Basic metabolic panel  Daily,   R     Question:  Specimen collection method  Answer:  Lab=Lab collect   02/09/22 1053   02/10/22 0500  CBC with Differential/Platelet  Daily,   R     Question:  Specimen collection method  Answer:  Lab=Lab collect   02/09/22 1053            Signed, Terrilee Croak, MD [facility] 02/09/2022

## 2022-02-10 ENCOUNTER — Inpatient Hospital Stay (HOSPITAL_COMMUNITY): Payer: Medicare Other

## 2022-02-10 DIAGNOSIS — L03818 Cellulitis of other sites: Secondary | ICD-10-CM | POA: Diagnosis not present

## 2022-02-10 HISTORY — PX: IR GASTR TUBE CONVERT GASTR-JEJ PER W/FL MOD SED: IMG2332

## 2022-02-10 LAB — CBC WITH DIFFERENTIAL/PLATELET
Abs Immature Granulocytes: 0 10*3/uL (ref 0.00–0.07)
Basophils Absolute: 0 10*3/uL (ref 0.0–0.1)
Basophils Relative: 0 %
Eosinophils Absolute: 0.2 10*3/uL (ref 0.0–0.5)
Eosinophils Relative: 3 %
HCT: 26.1 % — ABNORMAL LOW (ref 39.0–52.0)
Hemoglobin: 8.3 g/dL — ABNORMAL LOW (ref 13.0–17.0)
Lymphocytes Relative: 18 %
Lymphs Abs: 1 10*3/uL (ref 0.7–4.0)
MCH: 31.4 pg (ref 26.0–34.0)
MCHC: 31.8 g/dL (ref 30.0–36.0)
MCV: 98.9 fL (ref 80.0–100.0)
Monocytes Absolute: 0.3 10*3/uL (ref 0.1–1.0)
Monocytes Relative: 5 %
Neutro Abs: 3.9 10*3/uL (ref 1.7–7.7)
Neutrophils Relative %: 74 %
Platelets: 124 10*3/uL — ABNORMAL LOW (ref 150–400)
RBC: 2.64 MIL/uL — ABNORMAL LOW (ref 4.22–5.81)
RDW: 20.3 % — ABNORMAL HIGH (ref 11.5–15.5)
WBC: 5.3 10*3/uL (ref 4.0–10.5)
nRBC: 0 % (ref 0.0–0.2)
nRBC: 0 /100 WBC

## 2022-02-10 LAB — BASIC METABOLIC PANEL
Anion gap: 8 (ref 5–15)
BUN: 41 mg/dL — ABNORMAL HIGH (ref 8–23)
CO2: 26 mmol/L (ref 22–32)
Calcium: 9.2 mg/dL (ref 8.9–10.3)
Chloride: 103 mmol/L (ref 98–111)
Creatinine, Ser: 0.68 mg/dL (ref 0.61–1.24)
GFR, Estimated: >60 mL/min (ref >60)
Glucose, Bld: 107 mg/dL — ABNORMAL HIGH (ref 70–99)
Potassium: 4.7 mmol/L (ref 3.5–5.1)
Sodium: 137 mmol/L (ref 135–145)

## 2022-02-10 LAB — BPAM RBC
Blood Product Expiration Date: 202307062359
ISSUE DATE / TIME: 202306180944
Unit Type and Rh: 6200

## 2022-02-10 LAB — TYPE AND SCREEN
ABO/RH(D): A POS
Antibody Screen: NEGATIVE
Unit division: 0

## 2022-02-10 LAB — GLUCOSE, CAPILLARY
Glucose-Capillary: 152 mg/dL — ABNORMAL HIGH (ref 70–99)
Glucose-Capillary: 99 mg/dL (ref 70–99)

## 2022-02-10 MED ORDER — KETOROLAC TROMETHAMINE 15 MG/ML IJ SOLN: 15.0000 mg | Freq: Once | INTRAMUSCULAR | Status: AC

## 2022-02-10 MED ORDER — LIDOCAINE VISCOUS HCL 2 % MT SOLN
OROMUCOSAL | Status: AC
  Administered 2022-02-10: 10 mL
  Filled 2022-02-10: qty 15

## 2022-02-10 MED ORDER — IOHEXOL 300 MG/ML  SOLN
100.0000 mL | Freq: Once | INTRAMUSCULAR | Status: AC | PRN
  Administered 2022-02-10: 20 mL

## 2022-02-10 MED ORDER — KETOROLAC TROMETHAMINE 15 MG/ML IJ SOLN
INTRAMUSCULAR | Status: AC
  Administered 2022-02-10: 15 mg via INTRAVENOUS
  Filled 2022-02-10: qty 1

## 2022-02-10 MED ORDER — DIPHENHYDRAMINE HCL 50 MG/ML IJ SOLN
12.5000 mg | Freq: Once | INTRAMUSCULAR | Status: AC
  Administered 2022-02-10: 12.5 mg via INTRAVENOUS
  Filled 2022-02-10: qty 1

## 2022-02-10 NOTE — Progress Notes (Signed)
PT Cancellation Note  Patient Details Name: [name] MRN: [id number] DOB: 03-31-1933   Cancelled Treatment:    Reason Eval/Treat Not Completed: Patient at procedure or test/unavailable  Will follow up later today as time allows;  Otherwise, will follow up for PT tomorrow;   Thank you,  Roney Marion, [place] [facility] [place] 02/10/2022, 9:49 AM
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
## 2022-02-10 NOTE — Consult Note (Signed)
WOC re-consulted for clarification of the wound care orders.  See my note from 01/30/22; I have made sure the orders are in the computer.  I have DCed the [place] honey order that my partner had ordered.  I have reviewed the images and the Gtube wound is much improved. Continue current wound care  Zinc oxide to the periwound Cut to fit silver hydrofiber and place in open wound Use two foam, cut like split gauze to stabilize tube and lessen erosion of current site.   Discussed POC with patient and bedside nurse.  Re consult if needed, will not follow at this time. Thanks  Shineka Auble [employer], RN,CWOCN, CNS, [place] [phone])

## 2022-02-10 NOTE — Procedures (Signed)
Interventional Radiology Procedure Note  Procedure:   Image guided conversion of gtube to g-j feeding tube. 63F g-j placed.   Findings: The t-tack from the prior gtube was exposed and removed, which was also stuck on exposed mesh.  The t-tack/mesh conglomerate was sharply excised.    Hx of prior hernia repair.     There is now exposed mesh at the gtube site wound.    Complications: None  Recommendations:  - Ok to use - Do not submerge - Continue wound care   Signed,  Dulcy Fanny. Earleen Newport, DO

## 2022-02-10 NOTE — Progress Notes (Signed)
PROGRESS NOTE  ZIDANE RENNER  DOB: 05-May-1933  PCP: [name], DO GBT:[id number]  DOA: 01/24/2022  LOS: 53 days  Hospital Day: 18  Brief narrative: ZYHIR CAPPELLA is a 88 y.o. male with PMH significant for DM2, HTN, HLD, chronic systolic CHF, paroxysmal A-fib, SSS s/p PPM, CAD/CABG, chronic thrombocytopenia, recurrent UTI, OSA not on CPAP, dysphagia with PEG tube in place. Patient has chronic dysphagia and underwent PEG tube placement by IR on 5/24. Patient was brought to the ED on 01/24/2022 with complaint of malodorous and purulent discharge from the PEG tube site.  CT abd/pelvis showed 6.4x2.2 cm area of inflammatory/infectious phlegmon in subcutaneous plaine at site of placement of gastrostomy, pockets of air in the subcutaneous plane at the site of g tube placement (due to open wound in skin or suggest infection with gas producing organisms).  No definite demonstrable thick walled loculated fluid collection. 6/3, underwent fluoroscopic guided replacement and up sizing of now 24 fr ballon retention gastrostomy tube by IR. Per Dr. Pascal Lux, suspected pressure ulcer about medial aspect of g tube insertion - likely due to insertion angle of tube and external retention disc being too tight. General surgery is following periodically. They recommended keeping G tube to gravity to ensure no pressure on skin and balloon not pulled up against intraabdominal wall.   He underwent cortrak placement for tube feeding.   For the next several days, patient was continued on core track feeding while G tube was site was monitored for healing. 6/19, patient underwent image guided conversion of G-tube to [place] feeding tube.  Per IR note, the t-tack/mesh conglomerate was sharply excised.  Subjective: Patient was seen and examined this morning. He underwent gastrotomy tube conversion to gastrojejunostomy tube by IR today. Continues to have hematuria Daughter at bedside.  Principal Problem:    Cellulitis Active Problems:   Hypotension   Paroxysmal atrial fibrillation (HCC)   HFrEF (heart failure with reduced ejection fraction) (HCC)   C6 cervical fracture (HCC)   Controlled type 2 diabetes mellitus without complication, without long-term current use of insulin (HCC)   Delirium   S/P CABG x 3   Hypernatremia   Dysphagia   Anemia   Urinary retention   Diarrhea   Abnormal LFTs   Pressure ulcer   Goals of care, counseling/discussion   Status post insertion of percutaneous endoscopic gastrostomy (PEG) tube ([place])   Hematuria    Assessment and plan: Pressure wound related to G-tube Chronic dysphagia -6/19, patient underwent image guided conversion of G-tube to [place] feeding tube.  Per IR note, the t-tack/mesh conglomerate was sharply excised. -General surgery following.  Continue local wound care.  Hematuria History of prostate cancer status post radiation -Patient has history of prostate cancer and had radiation in the past under the care of Dr. Jeffie Pollock.  -6/13, patient was noted to have hematuria.  Eliquis was stopped.  Foley catheter was inserted and irrigated. -Renal ultrasound did not show any evidence of hydronephrosis or stone. -For the last 5 days, patient continues to have mild hematuria. -I paged urologist Dr. Jeffie Pollock for consult.  Acute on chronic anemia -Baseline hemoglobin mostly more than 9.  Hemoglobin gradually trended down due to hematuria.  Lowest hemoglobin of 7 on 6/18.   -1 unit of PRBC was transfused.  Hemoglobin up to 8.3 today Recent Labs    01/06/22 1840 01/09/22 0330 01/17/22 0550 01/18/22 0323 01/26/22 0037 01/27/22 7106 02/08/22 0058 02/09/22 0032 02/09/22 0610 02/09/22 1504 02/10/22 0412  HGB  --    < >  9.2*   < > 7.2*   < > 7.5* 7.1* 7.0* 7.6* 8.3*  MCV  --    < > 97.0   < > 100.4*   < > 102.5* 100.9*  --   --  98.9  VITAMINB12 366  --  507  --  381  --   --   --   --   --   --   FOLATE  --   --  11.3  --  10.0  --   --   --   --   --    --   FERRITIN  --   --  134  --  591*  --   --   --   --   --   --   TIBC  --   --  203*  --  169*  --   --   --   --   --   --   IRON  --   --  32*  --  31*  --   --   --   --   --   --   RETICCTPCT  --   --  1.6  --   --   --   --   --   --   --   --    < > = values in this interval not displayed.   Acute metabolic encephalopathy -Increased somnolence probably because of Remeron, Effexor, as needed morphine and as needed oxycodone.   -All mood altering medications have been stopped.   -Continue to monitor mental status change.  Severe protein calorie malnutrition -Low albumin level.  Continue tube feeding  Chronic systolic CHF Hypotension -Presented with hypotension with SBP of 90 over 50s.  This has improved with fluid but remains soft.  -PTA, patient was on Lasix, Entresto, Aldactone and beta-blockers at home.  Currently on hold.  Blood pressure stable. -Echocardiogram 6/5 with EF stable at 40 to 45%  CAD/CABG -No cardiac symptoms at this time.  Paroxysmal atrial fibrillation -Rate controlled on amiodarone.   -Eliquis for anticoagulation.  Type 2 diabetes mellitus -A1c 5.2 on 01/06/2022 -Currently controlled on sliding scale insulin with Accu-Cheks.   Recent Labs  Lab 02/08/22 1721 02/08/22 2356 02/09/22 0550 02/10/22 0010 02/10/22 0607  GLUCAP 151* 136* 113* 152* 99     Recent C6 cervical fracture  -After a ground-level fall in April after which she was placed on a soft cervical collar and seen by neurosurgery.   -6/6, neurosurgeon Dr. Ellene Route recommended to remove cervical collar. -Activity level as tolerated.   Goals of care, counseling/discussion -Family not interested in palliative care discussions.  Goals of care   Code Status: Full Code    Mobility: Last seen by PT today 6/16.  Home with PT OT are recommended.  Skin assessment: Pressure ulcer as below Pressure Injury 01/24/22 Buttocks Deep Tissue Pressure Injury - Purple or maroon localized area of  discolored intact skin or blood-filled blister due to damage of underlying soft tissue from pressure and/or shear. (Active)  01/24/22 2036  Location: Buttocks  Location Orientation:   Staging: Deep Tissue Pressure Injury - Purple or maroon localized area of discolored intact skin or blood-filled blister due to damage of underlying soft tissue from pressure and/or shear.  Wound Description (Comments):   Present on Admission: Yes    Nutritional status:  Body mass index is 25.52 kg/m.  Nutrition Problem: Severe Malnutrition (in the context of chronic illness)  Etiology: dysphagia Signs/Symptoms: severe fat depletion, severe muscle depletion     Diet:  Diet Order             Diet NPO time specified  Diet effective now                   DVT prophylaxis: SCDs Place and maintain sequential compression device Start: 02/10/22 1115 Place and maintain sequential compression device Start: 02/07/22 1725   Antimicrobials: None Fluid: None Consultants: None Family Communication: Daughter at bedside  Status is: Inpatient  Continue in-hospital care because: POD 0.  Continue to monitor.  Hematuria continues. Level of care: Telemetry Medical   Dispo: The patient is from: Home              Anticipated d/c is to: Pending clinical course              Patient currently is not medically stable to d/c.   Difficult to place patient No    Infusions:    Scheduled Meds:  acetaminophen  1,000 mg Per Tube Q8H   amiodarone  200 mg Per Tube Daily   Chlorhexidine Gluconate Cloth  6 each Topical Q0600   doxazosin  1 mg Per Tube Daily   famotidine  20 mg Per Tube BID   feeding supplement (JEVITY 1.5 CAL/FIBER)  1,000 mL Per Tube Q24H   feeding supplement (PROSource TF)  45 mL Per Tube Daily   fluticasone furoate-vilanterol  1 puff Inhalation Daily   free water  120 mL Per Tube Q4H   leptospermum manuka honey  1 application  Topical BID   nutrition supplement (JUVEN)  1 packet Per Tube  BID BM   Zinc Oxide   Topical BID    PRN meds: alum & mag hydroxide-simeth, diphenoxylate-atropine   Antimicrobials: Anti-infectives (From admission, onward)    Start     Dose/Rate Route Frequency Ordered Stop   01/31/22 1000  fluconazole (DIFLUCAN) tablet 200 mg        200 mg Per Tube Daily 01/30/22 1253 02/04/22 0959   01/30/22 2200  ceFAZolin (ANCEF) IVPB 2g/100 mL premix        2 g 200 mL/hr over 30 Minutes Intravenous Every 8 hours 01/30/22 1253 02/02/22 1535   01/30/22 0200  vancomycin (VANCOREADY) IVPB 1500 mg/300 mL  Status:  Discontinued        1,500 mg 150 mL/hr over 120 Minutes Intravenous  Once 01/30/22 0358 01/30/22 1248   01/30/22 0200  meropenem (MERREM) 1 g in sodium chloride 0.9 % 100 mL IVPB  Status:  Discontinued        1 g 200 mL/hr over 30 Minutes Intravenous  Once 01/30/22 0358 01/30/22 1248   01/27/22 1500  ceFAZolin (ANCEF) IVPB 2g/100 mL premix  Status:  Discontinued        2 g 200 mL/hr over 30 Minutes Intravenous Every 8 hours 01/27/22 1411 01/30/22 0357   01/27/22 1400  cefTRIAXone (ROCEPHIN) 2 g in sodium chloride 0.9 % 100 mL IVPB  Status:  Discontinued        2 g 200 mL/hr over 30 Minutes Intravenous Every 24 hours 01/27/22 1002 01/27/22 1347   01/27/22 1000  fluconazole (DIFLUCAN) tablet 100 mg  Status:  Discontinued        100 mg Per Tube Daily 01/26/22 0353 01/30/22 1253   01/26/22 1000  fluconazole (DIFLUCAN) 40 MG/ML suspension 100 mg  Status:  Discontinued       See Hyperspace for  full Linked Orders Report.   100 mg Per Tube Daily 01/25/22 1833 01/26/22 0353   01/26/22 0500  fluconazole (DIFLUCAN) IVPB 200 mg        200 mg 100 mL/hr over 60 Minutes Intravenous  Once 01/26/22 0353 01/26/22 0533   01/25/22 1930  fluconazole (DIFLUCAN) 40 MG/ML suspension 200 mg  Status:  Discontinued       See Hyperspace for full Linked Orders Report.   200 mg Per Tube  Once 01/25/22 1833 01/26/22 0353   01/25/22 1800  vancomycin (VANCOCIN) IVPB 1000 mg/200 mL  premix  Status:  Discontinued        1,000 mg 200 mL/hr over 60 Minutes Intravenous Every 24 hours 01/24/22 2041 01/27/22 1347   01/24/22 2230  meropenem (MERREM) 1 g in sodium chloride 0.9 % 100 mL IVPB  Status:  Discontinued        1 g 200 mL/hr over 30 Minutes Intravenous Every 8 hours 01/24/22 2134 01/27/22 1002   01/24/22 1800  vancomycin (VANCOCIN) IVPB 1000 mg/200 mL premix        1,000 mg 200 mL/hr over 60 Minutes Intravenous  Once 01/24/22 1757 01/24/22 1952       Objective: Vitals:   02/10/22 0749 02/10/22 0755  BP: (!) 126/54   Pulse: 71   Resp: 16   Temp: 98.3 F (36.8 C)   SpO2: 96% 100%    Intake/Output Summary (Last 24 hours) at 02/10/2022 1129 Last data filed at 02/09/2022 1405 Gross per 24 hour  Intake 304 ml  Output 600 ml  Net -296 ml   Filed Weights   02/08/22 0330 02/09/22 0500 02/10/22 0500  Weight: 83.5 kg 84.8 kg 83 kg   Weight change: -1.792 kg Body mass index is 25.52 kg/m.   Physical Exam: General exam: Pleasant elderly Caucasian male.  Looks significantly weak and sleepy. Skin: No rashes, lesions or ulcers. HEENT: Atraumatic, normocephalic, no obvious bleeding Lungs: Clear to auscultation bilaterally CVS: Regular rate and rhythm, no murmur GI/Abd soft, nontender, nondistended, bowel sound. PEG tube site with dressing on. CNS: Mostly sleepy.  Opens eyes on verbal command and answers few questions. Psychiatry: Sad affect Extremities: No pedal edema, no calf tenderness  Data Review: I have personally reviewed the laboratory data and studies available.  F/u labs ordered Unresulted Labs (From admission, onward)     Start     Ordered   02/10/22 3893  Basic metabolic panel  Daily,   R     Question:  Specimen collection method  Answer:  Lab=Lab collect   02/09/22 1053   02/10/22 0500  CBC with Differential/Platelet  Daily,   R     Question:  Specimen collection method  Answer:  Lab=Lab collect   02/09/22 1053             Signed, Terrilee Croak, MD [facility] 02/10/2022

## 2022-02-11 DIAGNOSIS — L03818 Cellulitis of other sites: Secondary | ICD-10-CM | POA: Diagnosis not present

## 2022-02-11 LAB — CBC WITH DIFFERENTIAL/PLATELET
Abs Immature Granulocytes: 0.05 K/uL (ref 0.00–0.07)
Basophils Absolute: 0 K/uL (ref 0.0–0.1)
Basophils Relative: 1 %
Eosinophils Absolute: 0 K/uL (ref 0.0–0.5)
Eosinophils Relative: 1 %
HCT: 25.4 % — ABNORMAL LOW (ref 39.0–52.0)
Hemoglobin: 7.9 g/dL — ABNORMAL LOW (ref 13.0–17.0)
Immature Granulocytes: 1 %
Lymphocytes Relative: 26 %
Lymphs Abs: 1.1 K/uL (ref 0.7–4.0)
MCH: 31.3 pg (ref 26.0–34.0)
MCHC: 31.1 g/dL (ref 30.0–36.0)
MCV: 100.8 fL — ABNORMAL HIGH (ref 80.0–100.0)
Monocytes Absolute: 0.6 K/uL (ref 0.1–1.0)
Monocytes Relative: 14 %
Neutro Abs: 2.5 K/uL (ref 1.7–7.7)
Neutrophils Relative %: 57 %
Platelets: 123 K/uL — ABNORMAL LOW (ref 150–400)
RBC: 2.52 MIL/uL — ABNORMAL LOW (ref 4.22–5.81)
RDW: 20.1 % — ABNORMAL HIGH (ref 11.5–15.5)
WBC: 4.3 K/uL (ref 4.0–10.5)
nRBC: 0 % (ref 0.0–0.2)

## 2022-02-11 LAB — GLUCOSE, CAPILLARY
Glucose-Capillary: 128 mg/dL — ABNORMAL HIGH (ref 70–99)
Glucose-Capillary: 134 mg/dL — ABNORMAL HIGH (ref 70–99)
Glucose-Capillary: 145 mg/dL — ABNORMAL HIGH (ref 70–99)
Glucose-Capillary: 158 mg/dL — ABNORMAL HIGH (ref 70–99)
Glucose-Capillary: 172 mg/dL — ABNORMAL HIGH (ref 70–99)

## 2022-02-11 LAB — BASIC METABOLIC PANEL WITH GFR
Anion gap: 7 (ref 5–15)
BUN: 38 mg/dL — ABNORMAL HIGH (ref 8–23)
CO2: 27 mmol/L (ref 22–32)
Calcium: 9 mg/dL (ref 8.9–10.3)
Chloride: 102 mmol/L (ref 98–111)
Creatinine, Ser: 0.73 mg/dL (ref 0.61–1.24)
GFR, Estimated: >60 mL/min (ref >60)
Glucose, Bld: 146 mg/dL — ABNORMAL HIGH (ref 70–99)
Potassium: 4.8 mmol/L (ref 3.5–5.1)
Sodium: 136 mmol/L (ref 135–145)

## 2022-02-11 MED ORDER — LINEZOLID 600 MG/300ML IV SOLN
600.0000 mg | Freq: Two times a day (BID) | INTRAVENOUS | Status: DC
  Administered 2022-02-11 – 2022-02-12 (×3): 600 mg via INTRAVENOUS
  Filled 2022-02-11 (×4): qty 300

## 2022-02-11 NOTE — Progress Notes (Signed)
   Subjective:    Patient ID: [name], male    DOB: 26-Jun-1933, 88 y.o.   MRN: [id number]  HPI  His foley is out and he is voiding into the Pure[place].  He has received 1 dose of linezolid but the urine is still rusty.   Review of Systems     Objective:   Physical Exam        Assessment & Plan:   Gross hematuria possibly secondary to UTI vs radiation cystitis.  Continue to monitor but if he doesn't improve in the next couple of days, he will need cystoscopy and possible fulguration.

## 2022-02-11 NOTE — Progress Notes (Signed)
Physical Therapy Treatment Patient Details Name: [name] MRN: [id number] DOB: Jul 11, 1933 Today's Date: 02/11/2022   History of Present Illness [name] is a 88 y.o. male who presented with purulent discharge from feeding tube. Due to likely pressure wound related to G tube, IR upsized G tube on 6/3 and cortrak was placed for temporary nutrition alternative. PMH: atrial fibrillation, chronic systolic CHF, CAD s/p CABG, sick sinus syndrome s/p pacemaker, COPD, type 2 diabetes, hypertension, hyperlipidemia, chronic thrombocytopenia, recurrent UTI, OSA not on CPAP, dysphagia with PEG tube , hx of C6 fx    PT Comments    Pt progressing towards his physical therapy goals this session; interactive and agreeable to participate in physical therapy session. Pt requiring two person maximal assist for bed mobility and utilized Denna Haggard to transfer from bed to chair. Encouraged pt/pt daughter to sit up for one hour. Will continue to progress as tolerated.   Recommendations for follow up therapy are one component of a multi-disciplinary discharge planning process, led by the attending physician.  Recommendations may be updated based on patient status, additional functional criteria and insurance authorization.  Follow Up Recommendations  Home health PT ([facility], RN)     Assistance Recommended at Discharge Frequent or constant Supervision/Assistance  Patient can return home with the following Two people to help with walking and/or transfers;Two people to help with bathing/dressing/bathroom;Assistance with cooking/housework;Assistance with feeding;Direct supervision/assist for medications management;Assist for transportation;Direct supervision/assist for financial management;Help with stairs or ramp for entrance   Equipment Recommendations  Wheelchair (measurements PT);Other (comment);Wheelchair cushion (measurements PT) (hoyer lift, air mattress overlay for bed)    Recommendations for Other  Services       Precautions / Restrictions Precautions Precautions: Fall;Other (comment) Precaution Comments: G tube draining to gravity (ok to mobilize with therapy per MD); soft collar discontinued Restrictions Weight Bearing Restrictions: No     Mobility  Bed Mobility Overal bed mobility: Needs Assistance Bed Mobility: Supine to Sit     Supine to sit: Max assist, +2 for physical assistance     General bed mobility comments: Pt able to initiate; cues for reaching LUE for bed rail and holding on, turning head to facilitate momentum, assist with bed pad to scoot hips forward to edge of bed and trunk to upright sitting position    Transfers Overall transfer level: Needs assistance Equipment used: Ambulation equipment used Transfers: Sit to/from Stand Sit to Stand: Max assist, +2 physical assistance, From elevated surface, Total assist           General transfer comment: MaxA + 2 to stand to [facility] with boost at hips; able to achieve upright this session    Ambulation/Gait               General Gait Details: unable today   Stairs             Wheelchair Mobility    Modified Rankin (Stroke Patients Only)       Balance Overall balance assessment: Needs assistance Sitting-balance support: No upper extremity supported, Feet supported, Single extremity supported, Bilateral upper extremity supported Sitting balance-Leahy Scale: Fair Sitting balance - Comments: Able to progress to no UE support with min guard assist; posterior bias with challenge. Tendency for left lateral lean when sitting up in chair                                    Cognition  Arousal/Alertness: Awake/alert Behavior During Therapy: Flat affect, Restless, Anxious Overall Cognitive Status: Impaired/Different from baseline Area of Impairment: Attention, Memory, Following commands, Safety/judgement, Awareness, Problem solving                   Current Attention  Level: Sustained Memory: Decreased recall of precautions, Decreased short-term memory Following Commands: Follows one step commands with increased time Safety/Judgement: Decreased awareness of safety, Decreased awareness of deficits Awareness: Intellectual Problem Solving: Slow processing, Decreased initiation, Requires verbal cues, Requires tactile cues General Comments: Pt continuously asking to lie back down once sitting up in chair; pt daughter present to provide distraction.        Exercises Other Exercises Other Exercises: Sitting: functional reaching with BUE's    General Comments        Pertinent Vitals/Pain Pain Assessment Pain Assessment: Faces Faces Pain Scale: Hurts even more Pain Location: generalized Pain Descriptors / Indicators: Aching, Discomfort, Grimacing, Guarding, Moaning Pain Intervention(s): Limited activity within patient's tolerance, Monitored during session    Home Living                          Prior Function            PT Goals (current goals can now be found in the care plan section) Acute Rehab PT Goals Patient Stated Goal: to eventually return home Time For Goal Achievement: 02/25/22 Potential to Achieve Goals: Fair Progress towards PT goals: Progressing toward goals    Frequency    Min 3X/week      PT Plan Current plan remains appropriate    Co-evaluation              AM-PAC PT "6 Clicks" Mobility   Outcome Measure  Help needed turning from your back to your side while in a flat bed without using bedrails?: Total Help needed moving from lying on your back to sitting on the side of a flat bed without using bedrails?: Total Help needed moving to and from a bed to a chair (including a wheelchair)?: Total Help needed standing up from a chair using your arms (e.g., wheelchair or bedside chair)?: Total Help needed to walk in hospital room?: Total Help needed climbing 3-5 steps with a railing? : Total 6 Click  Score: 6    End of Session Equipment Utilized During Treatment: Gait belt Activity Tolerance: Patient limited by pain Patient left: in chair;with call bell/phone within reach;with family/visitor present Nurse Communication: Mobility status;Need for lift equipment PT Visit Diagnosis: History of falling (Z91.81);Pain;Muscle weakness (generalized) (M62.81);Unsteadiness on feet (R26.81) Pain - Right/Left: Right Pain - part of body: Hip     Time: 0539-7673 PT Time Calculation (min) (ACUTE ONLY): 29 min  Charges:  $Therapeutic Activity: 23-37 mins                     Wyona Almas, PT, DPT Acute Rehabilitation Services Office [phone]    Deno Etienne 02/11/2022, 12:02 PM

## 2022-02-11 NOTE — Progress Notes (Signed)
PROGRESS NOTE  [name]  DOB: 25-Sep-1932  PCP: [name], DO ONG:[id number]  DOA: 01/24/2022  LOS: 80 days  Hospital Day: 19  Brief narrative: [name] is a 88 y.o. male with PMH significant for DM2, HTN, HLD, chronic systolic CHF, paroxysmal A-fib, SSS s/p PPM, CAD/CABG, chronic thrombocytopenia, recurrent UTI, OSA not on CPAP, dysphagia with PEG tube in place. Patient has chronic dysphagia and underwent PEG tube placement by IR on 5/24. Patient was brought to the ED on 01/24/2022 with complaint of malodorous and purulent discharge from the PEG tube site.  CT abd/pelvis showed 6.4x2.2 cm area of inflammatory/infectious phlegmon in subcutaneous plaine at site of placement of gastrostomy, pockets of air in the subcutaneous plane at the site of g tube placement (due to open wound in skin or suggest infection with gas producing organisms).  No definite demonstrable thick walled loculated fluid collection. 6/3, underwent fluoroscopic guided replacement and up sizing of now 24 fr ballon retention gastrostomy tube by IR. Per Dr. Pascal Lux, suspected pressure ulcer about medial aspect of g tube insertion - likely due to insertion angle of tube and external retention disc being too tight. General surgery is following periodically. They recommended keeping G tube to gravity to ensure no pressure on skin and balloon not pulled up against intraabdominal wall.   He underwent cortrak placement for tube feeding. For the next several days, patient was continued on core track feeding while G tube was site was monitored for healing. 6/19, patient underwent image guided conversion of G-tube to [place] feeding tube.  Per IR note, the t-tack/mesh conglomerate was sharply excised. See below for details  Subjective: Patient was seen and examined this morning. Elderly Caucasian male.  More awake and able to have some conversation.  Knows he is in the hospital.  No family at bedside today. Underwent G-tube  to [place] feeding tube conversion yesterday by IR. Urology consult appreciated for hematuria.  Principal Problem:   Cellulitis Active Problems:   Hypotension   Paroxysmal atrial fibrillation (HCC)   HFrEF (heart failure with reduced ejection fraction) (HCC)   C6 cervical fracture (HCC)   Controlled type 2 diabetes mellitus without complication, without long-term current use of insulin (HCC)   Delirium   S/P CABG x 3   Hypernatremia   Dysphagia   Anemia   Urinary retention   Diarrhea   Abnormal LFTs   Pressure ulcer   Goals of care, counseling/discussion   Status post insertion of percutaneous endoscopic gastrostomy (PEG) tube ([place])   Hematuria    Assessment and plan: Pressure wound related to G-tube Chronic dysphagia -6/19, patient underwent image guided conversion of G-tube to [place] feeding tube.  Per IR note, the t-tack/mesh conglomerate was sharply excised. -General surgery following.  Continue local wound care.  No need of systemic antibiotics per general surgery. -Dietitian to follow.  The previous core track tube feeding has been currently continued.  I believe the core track could come out today.  Hematuria History of prostate cancer status post radiation -Patient has history of prostate cancer and had radiation in the past under the care of Dr. Jeffie Pollock.  -6/13, patient was noted to have hematuria.  Eliquis was stopped.  Foley catheter was inserted and irrigated. Renal ultrasound did not show any evidence of hydronephrosis or stone.  Patient however continued to have hematuria. -6/19, urologist Dr. Jeffie Pollock was consulted.  He recommended removal of Foley catheter, treatment of UTI and monitoring of hematuria off Eliquis for  1 to 2 days.  UTI -VRE isolated in urine culture from 6/2 and 6/14.  Urology suspects if it is precipitating hematuria. -As recommended, I started the patient on Zyvox this morning.  Acute on chronic anemia -Baseline hemoglobin mostly more than 9.  Hemoglobin  gradually trended down due to hematuria.  Lowest hemoglobin of 7 on 6/18.   -1 unit of PRBC was transfused.  Continue to monitor hemoglobin. Recent Labs    02/04/22 1138 02/05/22 0022 02/06/22 0032 02/07/22 0254 02/08/22 0058 02/09/22 0032 02/09/22 0610 02/09/22 1504 02/10/22 0412 02/11/22 0444  HGB 7.4* 7.4* 7.8* 7.8* 7.5* 7.1* 7.0* 7.6* 8.3* 7.9*   Recent Labs    01/06/22 1840 01/09/22 0330 01/17/22 0550 01/18/22 0323 01/26/22 0037 01/27/22 0313 02/09/22 0032 02/09/22 0610 02/09/22 1504 02/10/22 0412 02/11/22 0444  HGB  --    < > 9.2*   < > 7.2*   < > 7.1* 7.0* 7.6* 8.3* 7.9*  MCV  --    < > 97.0   < > 100.4*   < > 100.9*  --   --  98.9 100.8*  VITAMINB12 366  --  507  --  381  --   --   --   --   --   --   FOLATE  --   --  11.3  --  10.0  --   --   --   --   --   --   FERRITIN  --   --  134  --  591*  --   --   --   --   --   --   TIBC  --   --  203*  --  169*  --   --   --   --   --   --   IRON  --   --  32*  --  31*  --   --   --   --   --   --   RETICCTPCT  --   --  1.6  --   --   --   --   --   --   --   --    < > = values in this interval not displayed.   Acute metabolic encephalopathy -Increased somnolence probably because of Remeron, Effexor, as needed morphine and as needed oxycodone.   -All mood altering medications have been stopped.   -Continue to monitor mental status change.  Severe protein calorie malnutrition -Low albumin level. Continue tube feeding  Chronic systolic CHF Hypotension -Presented with hypotension with SBP of 90 over 50s.  This has improved with fluid but remains soft.  -PTA, patient was on Lasix, Entresto, Aldactone and beta-blockers at home.  Currently on hold.  Blood pressure stable. -Echocardiogram 6/5 with EF stable at 40 to 45%  CAD/CABG -No cardiac symptoms at this time.  Paroxysmal atrial fibrillation -Rate controlled on amiodarone.   -Eliquis for anticoagulation.  Type 2 diabetes mellitus -A1c 5.2 on  01/06/2022 -Currently controlled on sliding scale insulin with Accu-Cheks.   Recent Labs  Lab 02/09/22 0550 02/10/22 0010 02/10/22 0607 02/11/22 0032 02/11/22 0646  GLUCAP 113* 152* 99 145* 158*     Recent C6 cervical fracture  -After a ground-level fall in April after which she was placed on a soft cervical collar and seen by neurosurgery.   -6/6, neurosurgeon Dr. Ellene Route recommended to remove cervical collar. -Activity level as tolerated.   Goals of care, counseling/discussion -Family  not interested in palliative care discussions.  Goals of care   Code Status: Full Code    Mobility: Last seen by PT today 6/16.  Home with PT OT are recommended.  Skin assessment: Pressure ulcer as below Pressure Injury 01/24/22 Buttocks Deep Tissue Pressure Injury - Purple or maroon localized area of discolored intact skin or blood-filled blister due to damage of underlying soft tissue from pressure and/or shear. (Active)  01/24/22 2036  Location: Buttocks  Location Orientation:   Staging: Deep Tissue Pressure Injury - Purple or maroon localized area of discolored intact skin or blood-filled blister due to damage of underlying soft tissue from pressure and/or shear.  Wound Description (Comments):   Present on Admission: Yes    Nutritional status:  Body mass index is 24.83 kg/m.  Nutrition Problem: Severe Malnutrition (in the context of chronic illness) Etiology: dysphagia Signs/Symptoms: severe fat depletion, severe muscle depletion     Diet:  Diet Order             Diet NPO time specified  Diet effective now                   DVT prophylaxis: SCDs Place and maintain sequential compression device Start: 02/10/22 1115 Place and maintain sequential compression device Start: 02/07/22 1725   Antimicrobials: None Fluid: None Consultants: None Family Communication: Daughter at bedside  Status is: Inpatient  Continue in-hospital care because: Continues to have hematuria.   Continue monitor Level of care: Telemetry Medical   Dispo: The patient is from: Home              Anticipated d/c is to: Pending clinical course.  Overall improving              Patient currently is not medically stable to d/c.   Difficult to place patient No    Infusions:   linezolid (ZYVOX) IV      Scheduled Meds:  acetaminophen  1,000 mg Per Tube Q8H   amiodarone  200 mg Per Tube Daily   Chlorhexidine Gluconate Cloth  6 each Topical Q0600   doxazosin  1 mg Per Tube Daily   famotidine  20 mg Per Tube BID   feeding supplement (JEVITY 1.5 CAL/FIBER)  1,000 mL Per Tube Q24H   feeding supplement (PROSource TF)  45 mL Per Tube Daily   fluticasone furoate-vilanterol  1 puff Inhalation Daily   free water  120 mL Per Tube Q4H   nutrition supplement (JUVEN)  1 packet Per Tube BID BM   Zinc Oxide   Topical BID    PRN meds: alum & mag hydroxide-simeth, diphenoxylate-atropine   Antimicrobials: Anti-infectives (From admission, onward)    Start     Dose/Rate Route Frequency Ordered Stop   02/11/22 1100  linezolid (ZYVOX) IVPB 600 mg        600 mg 300 mL/hr over 60 Minutes Intravenous Every 12 hours 02/11/22 1013     01/31/22 1000  fluconazole (DIFLUCAN) tablet 200 mg        200 mg Per Tube Daily 01/30/22 1253 02/04/22 0959   01/30/22 2200  ceFAZolin (ANCEF) IVPB 2g/100 mL premix        2 g 200 mL/hr over 30 Minutes Intravenous Every 8 hours 01/30/22 1253 02/02/22 1535   01/30/22 0200  vancomycin (VANCOREADY) IVPB 1500 mg/300 mL  Status:  Discontinued        1,500 mg 150 mL/hr over 120 Minutes Intravenous  Once 01/30/22 0358 01/30/22 1248  01/30/22 0200  meropenem (MERREM) 1 g in sodium chloride 0.9 % 100 mL IVPB  Status:  Discontinued        1 g 200 mL/hr over 30 Minutes Intravenous  Once 01/30/22 0358 01/30/22 1248   01/27/22 1500  ceFAZolin (ANCEF) IVPB 2g/100 mL premix  Status:  Discontinued        2 g 200 mL/hr over 30 Minutes Intravenous Every 8 hours 01/27/22 1411  01/30/22 0357   01/27/22 1400  cefTRIAXone (ROCEPHIN) 2 g in sodium chloride 0.9 % 100 mL IVPB  Status:  Discontinued        2 g 200 mL/hr over 30 Minutes Intravenous Every 24 hours 01/27/22 1002 01/27/22 1347   01/27/22 1000  fluconazole (DIFLUCAN) tablet 100 mg  Status:  Discontinued        100 mg Per Tube Daily 01/26/22 0353 01/30/22 1253   01/26/22 1000  fluconazole (DIFLUCAN) 40 MG/ML suspension 100 mg  Status:  Discontinued       See Hyperspace for full Linked Orders Report.   100 mg Per Tube Daily 01/25/22 1833 01/26/22 0353   01/26/22 0500  fluconazole (DIFLUCAN) IVPB 200 mg        200 mg 100 mL/hr over 60 Minutes Intravenous  Once 01/26/22 0353 01/26/22 0533   01/25/22 1930  fluconazole (DIFLUCAN) 40 MG/ML suspension 200 mg  Status:  Discontinued       See Hyperspace for full Linked Orders Report.   200 mg Per Tube  Once 01/25/22 1833 01/26/22 0353   01/25/22 1800  vancomycin (VANCOCIN) IVPB 1000 mg/200 mL premix  Status:  Discontinued        1,000 mg 200 mL/hr over 60 Minutes Intravenous Every 24 hours 01/24/22 2041 01/27/22 1347   01/24/22 2230  meropenem (MERREM) 1 g in sodium chloride 0.9 % 100 mL IVPB  Status:  Discontinued        1 g 200 mL/hr over 30 Minutes Intravenous Every 8 hours 01/24/22 2134 01/27/22 1002   01/24/22 1800  vancomycin (VANCOCIN) IVPB 1000 mg/200 mL premix        1,000 mg 200 mL/hr over 60 Minutes Intravenous  Once 01/24/22 1757 01/24/22 1952       Objective: Vitals:   02/11/22 0813 02/11/22 0903  BP: 124/66   Pulse: 78   Resp: 17   Temp: (!) 97.5 F (36.4 C)   SpO2: 98% 99%    Intake/Output Summary (Last 24 hours) at 02/11/2022 1017 Last data filed at 02/11/2022 0836 Gross per 24 hour  Intake 4780 ml  Output 1400 ml  Net 3380 ml   Filed Weights   02/09/22 0500 02/10/22 0500 02/11/22 0431  Weight: 84.8 kg 83 kg 80.7 kg   Weight change: -2.268 kg Body mass index is 24.83 kg/m.   Physical Exam: General exam: Pleasant elderly  Caucasian male.  Strength and alertness improving Skin: No rashes, lesions or ulcers. HEENT: Atraumatic, normocephalic, no obvious bleeding Lungs: Clear to auscultation bilaterally CVS: Regular rate and rhythm, no murmur GI/Abd soft, nontender, nondistended, bowel sound. gj tube site with dressing on. CNS: Opens eyes to verbal command.  Able to have more conversation today Psychiatry: Mood appropriate Extremities: No pedal edema, no calf tenderness  Data Review: I have personally reviewed the laboratory data and studies available.  F/u labs ordered Unresulted Labs (From admission, onward)     Start     Ordered   02/10/22 1884  Basic metabolic panel  Daily,   R  Question:  Specimen collection method  Answer:  Lab=Lab collect   02/09/22 1053   02/10/22 0500  CBC with Differential/Platelet  Daily,   R     Question:  Specimen collection method  Answer:  Lab=Lab collect   02/09/22 1053            Signed, Terrilee Croak, MD [facility] 02/11/2022

## 2022-02-11 NOTE — Progress Notes (Signed)
Mobility Specialist Progress Note:   02/11/22 1200  Mobility  Activity Transferred from chair to bed  Level of Assistance +2 (takes two people)  Assistive Device MaxiMove  Activity Response Tolerated well  $Mobility charge 1 Mobility   Pts daughter requesting to transfer pt back to bed via maximove. Pt tolerated well, left with all needs met.   [name] Acute Rehab Secure Chat or Office Phone: [phone]

## 2022-02-12 ENCOUNTER — Ambulatory Visit: Payer: Medicare Other | Admitting: Family Medicine

## 2022-02-12 DIAGNOSIS — D649 Anemia, unspecified: Secondary | ICD-10-CM | POA: Diagnosis not present

## 2022-02-12 DIAGNOSIS — R7989 Other specified abnormal findings of blood chemistry: Secondary | ICD-10-CM | POA: Diagnosis not present

## 2022-02-12 DIAGNOSIS — L03818 Cellulitis of other sites: Secondary | ICD-10-CM | POA: Diagnosis not present

## 2022-02-12 DIAGNOSIS — L03311 Cellulitis of abdominal wall: Secondary | ICD-10-CM | POA: Diagnosis not present

## 2022-02-12 LAB — BASIC METABOLIC PANEL
Anion gap: 5 (ref 5–15)
BUN: 34 mg/dL — ABNORMAL HIGH (ref 8–23)
CO2: 24 mmol/L (ref 22–32)
Calcium: 8.9 mg/dL (ref 8.9–10.3)
Chloride: 107 mmol/L (ref 98–111)
Creatinine, Ser: 0.7 mg/dL (ref 0.61–1.24)
GFR, Estimated: >60 mL/min (ref >60)
Glucose, Bld: 133 mg/dL — ABNORMAL HIGH (ref 70–99)
Potassium: 4.3 mmol/L (ref 3.5–5.1)
Sodium: 136 mmol/L (ref 135–145)

## 2022-02-12 LAB — CBC WITH DIFFERENTIAL/PLATELET
Abs Immature Granulocytes: 0.03 10*3/uL (ref 0.00–0.07)
Basophils Absolute: 0 10*3/uL (ref 0.0–0.1)
Basophils Relative: 0 %
Eosinophils Absolute: 0.1 10*3/uL (ref 0.0–0.5)
Eosinophils Relative: 2 %
HCT: 25 % — ABNORMAL LOW (ref 39.0–52.0)
Hemoglobin: 8 g/dL — ABNORMAL LOW (ref 13.0–17.0)
Immature Granulocytes: 1 %
Lymphocytes Relative: 26 %
Lymphs Abs: 1 10*3/uL (ref 0.7–4.0)
MCH: 31.7 pg (ref 26.0–34.0)
MCHC: 32 g/dL (ref 30.0–36.0)
MCV: 99.2 fL (ref 80.0–100.0)
Monocytes Absolute: 0.6 10*3/uL (ref 0.1–1.0)
Monocytes Relative: 16 %
Neutro Abs: 2.1 10*3/uL (ref 1.7–7.7)
Neutrophils Relative %: 55 %
Platelets: 114 10*3/uL — ABNORMAL LOW (ref 150–400)
RBC: 2.52 MIL/uL — ABNORMAL LOW (ref 4.22–5.81)
RDW: 19.4 % — ABNORMAL HIGH (ref 11.5–15.5)
WBC: 3.9 10*3/uL — ABNORMAL LOW (ref 4.0–10.5)
nRBC: 0 % (ref 0.0–0.2)

## 2022-02-12 LAB — GLUCOSE, CAPILLARY
Glucose-Capillary: 129 mg/dL — ABNORMAL HIGH (ref 70–99)
Glucose-Capillary: 199 mg/dL — ABNORMAL HIGH (ref 70–99)
Glucose-Capillary: 129 mg/dL — ABNORMAL HIGH (ref 70–99)

## 2022-02-12 MED ORDER — LINEZOLID 600 MG/300ML IV SOLN
600.0000 mg | Freq: Two times a day (BID) | INTRAVENOUS | Status: DC
  Filled 2022-02-12: qty 300

## 2022-02-12 MED ORDER — ONDANSETRON HCL 4 MG/2ML IJ SOLN
4.0000 mg | Freq: Four times a day (QID) | INTRAMUSCULAR | Status: DC | PRN
  Administered 2022-02-17: 4 mg via INTRAVENOUS
  Filled 2022-02-12 (×3): qty 2

## 2022-02-12 MED ORDER — LINEZOLID 600 MG PO TABS
600.0000 mg | ORAL_TABLET | Freq: Two times a day (BID) | ORAL | Status: DC
  Filled 2022-02-12: qty 1

## 2022-02-12 MED ORDER — LINEZOLID 600 MG/300ML IV SOLN
600.0000 mg | Freq: Two times a day (BID) | INTRAVENOUS | Status: DC
Start: 2022-02-12 — End: 2022-02-16
  Administered 2022-02-12 – 2022-02-16 (×8): 600 mg via INTRAVENOUS
  Filled 2022-02-12 (×9): qty 300

## 2022-02-12 NOTE — Progress Notes (Signed)
Nutrition Follow-up  DOCUMENTATION CODES:  Severe malnutrition in context of chronic illness  INTERVENTION:  Continue TF as follows: Jevity 1.5 at 94m/h Free water flush [id number] Prosource TF 1x/d This provides 2000kcal, 95g of protein, 1723101mree water (TF+flush) Continue Juven BID, each packet provides 95 calories, 2.5 grams of protein (collagen), and 9.8 grams of carbohydrate (3 grams sugar) + micronutrients for wound healing At discharge, if TF were to be adjusted to nocturnal to provide a break from being connected to pump, recommend the following Jevity 1.5 at 75m58mx 15 hours (1350mL1mFree water flush 120mL 54mProsource TF 1x/d This provides 2065kcal, 97g of protein, 1746mL f26mwater (TF+flush)  NUTRITION DIAGNOSIS:  Severe Malnutrition (in the context of chronic illness) related to dysphagia as evidenced by severe fat depletion, severe muscle depletion. - remains applicable  GOAL:  Patient will meet greater than or equal to 90% of their needs - being addressed with TF  MONITOR:  Diet advancement, Labs, Weight trends, TF tolerance, Skin, I & O's  REASON FOR ASSESSMENT:  Consult Assessment of nutrition requirement/status  ASSESSMENT:  88 y.o.78ale with medical history significant of paroxysmal atrial fibrillation, chronic systolic CHF, CAD s/p CABG x 3, sick sinus syndrome s/p pacemaker, COPD, type 2 diabetes, hypertension, hyperlipidemia, chronic thrombocytopenia, incisional hernia s/p repair 2004, inguinal hernia repair, recurrent UTI, OSA not on CPAP, prostate ca, COVID 19 (6/22), AAA, GERD, CVA and dysphagia s/p IR G tube 5/24 who presents with concerns of purulent discharge from feeding tube and diarrhea.  6/3 - IR replacement of 24 Fr balloon retention gastrostomy tube  6/5 - cortrak tube placement (post-pyloric) 6/19 - G-tube conversion to G-J tube 6/20 - cortrak removed  Pt resting in bed at the time of assessment, daughter and niece at  bedside. Pt  tolerating feeds via newly place J extension well. Daughter reports that she feels that the formula is being tolerated well, stools are more formed than on the Osmolite. Daughter reports that wound is healing well.  Will continue current nutrition plan.  Nutritionally Relevant Medications: Scheduled Meds:  doxazosin  1 mg Per Tube Daily   famotidine  20 mg Per Tube BID   JEVITY 1.5 CAL/FIBER  1,000 mL Per Tube Q24H   PROSource TF  45 mL Per Tube Daily   free water  120 mL Per Tube Q4H   JUVEN  1 packet Per Tube BID BM   Continuous Infusions:  linezolid (ZYVOX) IV 600 mg (02/11/22 2151)   PRN Meds: alum & mag hydroxide-simeth  Labs Reviewed: BUN 34 CBG ranges from 128-172 mg/dL over the last 24 hours  NUTRITION - FOCUSED PHYSICAL EXAM: Flowsheet Row Most Recent Value  Orbital Region Severe depletion  Upper Arm Region Severe depletion  Thoracic and Lumbar Region Severe depletion  Buccal Region Severe depletion  Temple Region Moderate depletion  Clavicle Bone Region Severe depletion  Clavicle and Acromion Bone Region Severe depletion  Scapular Bone Region Severe depletion  Dorsal Hand Severe depletion  Patellar Region Severe depletion  Anterior Thigh Region Severe depletion  Posterior Calf Region Severe depletion  Edema (RD Assessment) None  Hair Reviewed  Eyes Reviewed  Mouth Reviewed  Skin Reviewed  Nails Reviewed   Diet Order:   Diet Order             Diet NPO time specified  Diet effective now                  EDUCATION NEEDS:  Education needs have been addressed (with pt's daughter)  Skin:  Skin Assessment: Reviewed RN Assessment (ecchymosis, DTI buttocks)  Last BM:  6/20 - type 6  Height:  Ht Readings from Last 1 Encounters:  01/25/22 '5\' 11"'$  (1.803 m)   Weight:  Wt Readings from Last 1 Encounters:  02/12/22 82.6 kg   Ideal Body Weight:  78 kg  BMI:  Body mass index is 25.38 kg/m.  Estimated Nutritional Needs:  Kcal:   1900-2200kcal/day Protein:  95-110g/day Fluid:  1.9-2.2L/day   Ranell Patrick, RD, LDN Clinical Dietitian RD pager # available in [facility]  After hours/weekend pager # available in [facility]

## 2022-02-12 NOTE — Progress Notes (Signed)
Mobility Specialist Progress Note   02/12/22 1400  Mobility  Activity Transferred from chair to bed  Level of Assistance +2 (takes two people) (+3 (for line management and safety))  Assistive Device MaxiMove  RLE Weight Bearing WBAT  LLE Weight Bearing WBAT  Activity Response Tolerated fair  $Mobility charge 1 Mobility   Received pt in chair shouting in discomfort requesting to get back to bed. +3A(for line management and safety). Transferred to bed w/o fault and NT present in room.   Holland Falling Mobility Specialist Phone Number [phone]

## 2022-02-12 NOTE — Progress Notes (Signed)
This patient is receiving the antibiotic linezolid by the intravenous route.   Based on criteria approved by the Pharmacy and Therapeutics Committee, and the  Infectious Disease Division, the antibiotic(s) is / are being converted to equivalent oral dose form(s). These criteria include:  Patient being treated for a respiratory tract infection, urinary tract infection, cellulitis, or Clostridium Difficile associated diarrhea  The patient is not neutropenic and does not exhibit a GI malabsorption state  The patient is eating (either orally or per tube) and/or has been taking other orally administered medications for at least 24 hours.  The patient is improving clinically (physician assessment and a 24-hour Tmax of 100.5 F).   If you have questions about this conversion, please contact the pharmacy department.   Thank you for allowing pharmacy to be a part of this patient's care.  Ardyth Harps, PharmD Clinical Pharmacist

## 2022-02-12 NOTE — Progress Notes (Signed)
Occupational Therapy Treatment Patient Details Name: [name] MRN: [id number] DOB: Jul 24, 1933 Today's Date: 02/12/2022   History of present illness [name] is a 88 y.o. male who presented with purulent discharge from feeding tube. Due to likely pressure wound related to G tube, IR upsized G tube on 6/3 and cortrak was placed for temporary nutrition alternative. PMH: atrial fibrillation, chronic systolic CHF, CAD s/p CABG, sick sinus syndrome s/p pacemaker, COPD, type 2 diabetes, hypertension, hyperlipidemia, chronic thrombocytopenia, recurrent UTI, OSA not on CPAP, dysphagia with PEG tube , hx of C6 fx   OT comments  Pt with minimal progress towards set OT goals, updated and downgraded accordingly. Pt continues to require extensive +2 physical assist for all standing attempts in [place] for transfer OOB. Pt cognition remains a barrier due to deficits in orientation, awareness and attention. Pt's daughter present, hands on to assist throughout including with bed level toileting assist. Collaborated with daughter for revision of OT goals with plans to focus on hoyer lift training and bed level ADL education in next sessions. Noted AIR declined and family planning to take pt home instead, recommend max HH services.    Recommendations for follow up therapy are one component of a multi-disciplinary discharge planning process, led by the attending physician.  Recommendations may be updated based on patient status, additional functional criteria and insurance authorization.    Follow Up Recommendations  Home health OT (AIR declined and family declining SNF)    Assistance Recommended at Discharge Frequent or constant Supervision/Assistance  Patient can return home with the following  Two people to help with walking and/or transfers;A lot of help with bathing/dressing/bathroom;Direct supervision/assist for medications management;Direct supervision/assist for financial management;Assistance with  feeding;Assist for transportation;Help with stairs or ramp for entrance   Equipment Recommendations  Other (comment) (air mattress overlay; hoyer lift)    Recommendations for Other Services Other (comment) (palliative consult)    Precautions / Restrictions Precautions Precautions: Fall;Other (comment) Precaution Comments: G tube draining to gravity (ok to mobilize with therapy per MD); soft collar discontinued Restrictions Weight Bearing Restrictions: No RLE Weight Bearing: Weight bearing as tolerated LLE Weight Bearing: Weight bearing as tolerated       Mobility Bed Mobility Overal bed mobility: Needs Assistance Bed Mobility: Supine to Sit     Supine to sit: Mod assist, +2 for physical assistance, HOB elevated     General bed mobility comments: Pt able to roll to side and swing legs off with minimal assist. +2 needed to lift trunk effectively    Transfers Overall transfer level: Needs assistance Equipment used: Ambulation equipment used Transfers: Sit to/from Stand, Bed to chair/wheelchair/BSC Sit to Stand: Max assist, +2 physical assistance, From elevated surface           General transfer comment: 3 trials of standing in [place] with pt difficulty with trunk extension requiring heavy Max A  x2 to complete. Pt also with difficulty keeping feet apart during standing attempts (not noted in bed or in chair). Transferred to recliner via Charlaine Dalton per daughter request Transfer via Lift Equipment: St[place]   Balance Overall balance assessment: Needs assistance Sitting-balance support: No upper extremity supported, Feet supported, Single extremity supported, Bilateral upper extremity supported Sitting balance-Leahy Scale: Fair     Standing balance support: Bilateral upper extremity supported, Reliant on assistive device for balance Standing balance-Leahy Scale: Zero  ADL either performed or assessed with clinical judgement   ADL Overall  ADL's : Needs assistance/impaired                           Toilet Transfer Details (indicate cue type and reason): use of bedpan x 2 during session Toileting- Clothing Manipulation and Hygiene: Total assistance;Bed level;+2 for safety/equipment Toileting - Clothing Manipulation Details (indicate cue type and reason): for cleanup bed level after bedpan use       General ADL Comments: Focus on standing trials with continued significant effort required. Daughter present, hands on to assist (turned off feeding tube and IV during session - RN aware). Plan to focus on bed level ADLs and hoyer lift training in next session    Extremity/Trunk Assessment Upper Extremity Assessment Upper Extremity Assessment: Generalized weakness   Lower Extremity Assessment Lower Extremity Assessment: Defer to PT evaluation        Vision   Vision Assessment?: No apparent visual deficits   Perception     Praxis      Cognition Arousal/Alertness: Awake/alert Behavior During Therapy: Flat affect, Restless, Anxious Overall Cognitive Status: Impaired/Different from baseline Area of Impairment: Attention, Memory, Following commands, Safety/judgement, Awareness, Problem solving, Orientation                 Orientation Level: Disoriented to, Place, Time, Situation Current Attention Level: Sustained Memory: Decreased recall of precautions, Decreased short-term memory Following Commands: Follows one step commands with increased time Safety/Judgement: Decreased awareness of safety, Decreased awareness of deficits Awareness: Intellectual Problem Solving: Slow processing, Decreased initiation, Requires verbal cues, Requires tactile cues General Comments: Pt follows commands relatively consistently though yells out at times inconsistently related to pain. Unaware of location - daughter oriented pt during session        Exercises Exercises: Other exercises Other Exercises Other Exercises: B  LE kicks seated in recliner    Shoulder Instructions       General Comments Daughter present, hands on to assist    Pertinent Vitals/ Pain       Pain Assessment Pain Assessment: Faces Faces Pain Scale: Hurts even more Pain Location: abdomen, legs after standing Pain Descriptors / Indicators: Guarding, Grimacing Pain Intervention(s): Monitored during session, Limited activity within patient's tolerance  Home Living                                          Prior Functioning/Environment              Frequency  Min 2X/week        Progress Toward Goals  OT Goals(current goals can now be found in the care plan section)  Progress towards OT goals: OT to reassess next treatment  Acute Rehab OT Goals Patient Stated Goal: daughter hopeful for pt to go home OT Goal Formulation: With patient/family Time For Goal Achievement: 02/26/22 Potential to Achieve Goals: [place] Discharge plan needs to be updated    Co-evaluation                 AM-PAC OT "6 Clicks" Daily Activity     Outcome Measure   Help from another person eating meals?: Total (NPO) Help from another person taking care of personal grooming?: A Lot Help from another person toileting, which includes using toliet, bedpan, or urinal?: Total Help from another person bathing (including  washing, rinsing, drying)?: A Lot Help from another person to put on and taking off regular upper body clothing?: A Lot Help from another person to put on and taking off regular lower body clothing?: Total 6 Click Score: 9    End of Session Equipment Utilized During Treatment: Gait belt  OT Visit Diagnosis: Unsteadiness on feet (R26.81);Other abnormalities of gait and mobility (R26.89);Muscle weakness (generalized) (M62.81);History of falling (Z91.81);Feeding difficulties (R63.3);Other symptoms and signs involving cognitive function;Adult, failure to thrive (R62.7);Pain Pain - part of body:   (abdomen)   Activity Tolerance Patient limited by fatigue   Patient Left in chair;with call bell/phone within reach;with family/visitor present   Nurse Communication Mobility status;Need for lift equipment        Time: 2025-4270 OT Time Calculation (min): 30 min  Charges: OT General Charges $OT Visit: 1 Visit OT Treatments $Self Care/Home Management : 8-22 mins $Therapeutic Activity: 8-22 mins  Malachy Chamber, OTR/L Acute Rehab Services Office: [phone]   Layla Maw 02/12/2022, 2:34 PM

## 2022-02-12 NOTE — Progress Notes (Signed)
PROGRESS NOTE    [name]  LGX:[id number] DOB: Mar 21, 1933 DOA: 01/24/2022 PCP: [name], DO    Chief Complaint  Patient presents with   feeding tube issue     Brief Narrative:  [name] is a 88 y.o. male with medical history significant of paroxysmal atrial fibrillation, chronic systolic CHF, CAD s/p CABG, sick sinus syndrome s/p pacemaker, COPD, type 2 diabetes, hypertension, hyperlipidemia, chronic thrombocytopenia, recurrent UTI, OSA not on CPAP, dysphagia with PEG tube who presents with concerns of purulent discharge from feeding tube.  S/p upsizing of g tube on 6/3.  Surgery c/s and recommended keeping g tube to gravity to ensure no pressure of phlange on skin and balloon not pulled up against intraabdominal wall.  Per Dr. Pascal Lux, suspected pressure ulcer about medial aspect of g tube insertion - likely due to insertion angle of tube and external retention disc being too tight. General surgery is following periodically. They recommended keeping G tube to gravity to ensure no pressure on skin and balloon not pulled up against intraabdominal wall.   He underwent cortrak placement for tube feeding. For the next several days, patient was continued on core track feeding while G tube was site was monitored for healing. 6/19, patient underwent image guided conversion of G-tube to [place] feeding tube.  Per IR note, the t-tack/mesh conglomerate was sharply excised. See below for details     Assessment & Plan:  Principal Problem:   Cellulitis Active Problems:   Hypotension   Paroxysmal atrial fibrillation (HCC)   HFrEF (heart failure with reduced ejection fraction) (HCC)   C6 cervical fracture (HCC)   Controlled type 2 diabetes mellitus without complication, without long-term current use of insulin (HCC)   Delirium   S/P CABG x 3   Hypernatremia   Dysphagia   Anemia   Urinary retention   Diarrhea   Abnormal LFTs   Pressure ulcer   Goals of care,  counseling/discussion   Status post insertion of percutaneous endoscopic gastrostomy (PEG) tube ([place])   Hematuria    Assessment and Plan: * Cellulitis Pt has dysphagia and is s/p PEG tube placement on 5/24.  Presents with malodor and purulent discharge from PEG tube site. CT abd/pelvis showed 6.4x2.2 cm area of inflammatory/infectious phlegmon in subcutaneous plaine at site of placement of gastrostomy, pockets of air in the subcutaneous plane at the site of g tube placement (due to open wound in skin or suggest infection with gas producing organisms).  No definite demonstrable thick walled loculated fluid collection. S/p fluoroscopic guided replacement and up sizing of now 24 fr ballon retention gastrostomy tube 6/3 per Dr. Pascal Lux, suspected pressure ulcer about medial aspect of g tube insertion - likely due to insertion angle of tube and external retention disc being too tight  - g tube to gravity  - local wound care (zinc oxide to intact portion of epidermis, dressing changes around g tube 4x daily)  - post pyloric nutrition  - wound care  - available as needed, IR can follow up prn as well Issue at this time is g tube is to gravity and he has cortrak while we're allowing wound to heal.  It's not clear how long he'll need g tube to gravity and post pyloric feeding at this point per general surgery early on during the hospitalization.  -Status post full course Ancef. (he's tolerated cephalosporins and augmentin in past per pharmacy) -Wound cultures are pending (moderate staph epi, moderate candida albicans - not sure  how clinically relevant these are - will follow) -blood cultures negative. -Wound care consulted, appreciate recs - medi honey added -On 01/29/2022 in the evening it was noted per RN and daughter that wound had looked worse and crossed, came and assessed the patient and IV antibiotics broadened back to vancomycin and Merrem. -Patient reassessed by general surgery again 01/30/2022 and  stating wound from a pressure necrosis from his G-tube, recommending continued current wound care, de-escalation of antibiotics, and if wound is not shrinking over the next 1 to 2 weeks other considerations will need to be entertained at that time.  - IV antibiotics narrowed back to Ancef and patient completed a 10-day course of antibiotic treatment. -6/19 patient underwent image guided conversion of G-tube to [place] feeding tube.  Per IR note that T tack mesh conglomerate was sharply excised. -Continue current local wound care. -Per general surgery no need for any systemic antibiotics at this time and general surgery following. -Tube feeds started dietitian following. -Core track discontinued. -Appreciate general surgery's input and recommendations.  Hypotension Presented with hypotension with SBP of 90 over 50s.  This has improved with fluid but remains soft.  Previously on Lasix, Entresto, Aldactone and beta-blockers at home but these were hold during last admission.   -Continue to hold hypertensive medications secondary to low blood pressure. -Saline lock IV fluids.  -repeat echo -> EF 40-45% - BP overall improved, follow  C6 cervical fracture ([place]) This is from a ground-level fall back in April.  Was on a soft cervical collar and continue follow-up with neurosurgery. Discussed with Dr. Ellene Route, he notes ok to d/c c collar Flex ex films  with known subacute fx through anterior inferior aspect of c 6 vertebral body, anterior osteophyte not well seen, C6-7 space normally aligned without abnormal motion on extension, not well assessed on flexion (see report) -Patient seen by neurosurgery who states C6 fracture is healed and patient can be removed from collar and pursue activities as tolerated.  HFrEF (heart failure with reduced ejection fraction) ([place]) Appears euvolemic on exam. Echo as above, -Prior to admission patient noted to be on Lasix, Entresto, Aldactone and beta-blockers which are  currently on hold due to soft blood pressure.  Paroxysmal atrial fibrillation (HCC) -Rate controlled on amiodarone.   -Eliquis held due to ongoing hematuria. -Urology to advise when Eliquis may be resumed.  Delirium Delirium precautions -Improved.  Controlled type 2 diabetes mellitus without complication, without long-term current use of insulin (HCC) Controlled.  Last A1c 5.2% on 5/15. -CBG 129 this morning.   -Follow.  Hypernatremia - Likely secondary to dehydration.   -Improved with hydration.   -Patient placed on gentle hydration 01/31/2022 due to malposition of cortrack tube. -Cortrack placed back and was in position as such IV fluids discontinued.   -Patient now with a GJ tube and core track discontinued and patient on tube feeds.   -Follow.  S/P CABG x 3 Stable and asymptomatic from cardiac standpoint.  Anemia  Anemia labs c/w aocd, iron def -Patient with hematuria which has been ongoing. -Status post transfusion 1 unit packed red blood cells 02/09/2022. -Hemoglobin stable at 8. -Transfusion threshold hemoglobin < 7.    Dysphagia S/p PEG tube by VIR on 5/24, s/p replacement as above -Dietitian consulted for tube feeding  -The afternoon of 01/31/2022, concerned that tube feeds coming out of PEG tube site and concern for malposition of cortrack. -Abdominal films obtained and feeding tube tip noted at the position of the pylorus directed towards the  duodenum. -Advanced cortrack 4 cm, 02/01/2022 with repeat abdominal films showing appropriate placement of cortrack tube.  -Resumed tube feeds. -IV fluids have been discontinued. -6/19 patient underwent image guided conversion of G-tube to [place] feeding tube. -Tube feeds resumed with dietitian following. -Cortrack discontinued -Dietitian following and appreciate their input and recommendations.  Urinary retention Foley catheter placed on 5/27 due to urinary retention at recent admission.  He has planned outpatient follow-up  with urology. -Patient underwent successful voiding trial.  -Urine output 2300 cc over the past 24 hours. -Patient with good urine output post Foley catheter removal with hematuria noted.  -Continue doxazosin. -Monitor urine output. -Urology following.  Diarrhea Daughter reported persistent diarrhea since the start of tube feed on 5/25.  -Tube feeds changed with some improvement with diarrhea. Negative GI pathogen panel Negative c diff  Abnormal LFTs Mildly elevated -LFTs trending down. -Follow-up.  Pressure ulcer Pressure Injury 01/24/22 Buttocks Deep Tissue Pressure Injury - Purple or maroon localized area of discolored intact skin or blood-filled blister due to damage of underlying soft tissue from pressure and/or shear. (Active)  01/24/22 2036  Location: Buttocks  Location Orientation:   Staging: Deep Tissue Pressure Injury - Purple or maroon localized area of discolored intact skin or blood-filled blister due to damage of underlying soft tissue from pressure and/or shear.  Wound Description (Comments):   Present on Admission: Yes   Wound c/s   Goals of care, counseling/discussion Not currently interested in palliative care discussions, noted we can review pending course  Hematuria - Patient with hematuria noted since 02/04/2022. -Patient with history of prostate cancer status postradiation under the care of Dr. Jeffie Pollock. -Patient denies any abdominal pain, no dysuria, no nausea, no vomiting, no CVA tenderness. -Per daughter patient with prior history of nephrolithiasis. -Renal ultrasound with no hydronephrosis or shadowing stone, bilateral renal cyst, minimal bladder debris.   -Urine cultures positive for VRE -Foley catheter initially placed with irrigation however with no significant improvement with hematuria and as such Foley catheter discontinued per urology recommendations. -Patient seen in consultation by urology, Dr. Thurmond Butts on 02/10/2022 who recommended removal of Foley  catheter, treatment of UTI and monitoring hematuria off Eliquis for 1 to 2 days and if no significant improvement will likely need cystoscopy and possible fulguration.   -Patient started on Zyvox which we will continue.  -Supportive care.         DVT prophylaxis: SCDs Code Status: Full. Family Communication: Updated patient.  Updated daughter at bedside.   Disposition: SNF  Status is: Inpatient Remains inpatient appropriate because: Severity of illness   Consultants:  Neurosurgery: Dr. Ellene Route 01/28/2022 General surgery: Dr. Kieth Brightly 01/26/2022 Wound care RN Urology: Dr. Jeffie Pollock 02/10/2022   Procedures:  CT abdomen and pelvis 01/24/2022 Plain films of the cervical spine 01/27/2022 Fluoroscopic guided replacement of gastrostomy tube with upsizing 01/25/2022 interventional radiology 2D echo 01/27/2022 Renal ultrasound 02/04/2022 MH guided conversion of G-tube to [place] feeding tube, 24 F GJ placed by IR, Dr. Earleen Newport 02/10/2022    Antimicrobials:  Diflucan 01/25/2022>>>> 02/02/2022 IV Ancef 01/27/2022>>>>> 02/02/2022 IV Rocephin 01/27/2022 x 1 dose IV Merrem 01/24/2022>>>> 01/27/2022 IV vancomycin 01/24/2022>>>>> 01/27/2022 IV Zyvox 02/11/2022>>> 02/12/2022>>>> oral Zyvox 02/12/2022>>>>    Subjective: Laying in bed.  No chest pain.  No shortness of breath.  No abdominal pain.  Daughter at bedside.  Still with hematuria.  Per daughter patient very emotional after Effexor and Remeron discontinued however more alert.  Hematuria noted  Sleeping but easily arousable.  Daughter at bedside.  No chest pain.  No shortness of breath.  No dysuria.  Per daughter patient with some complaints of lower back pain.  Still with gross hematuria noted.    Objective: Vitals:   02/12/22 0458 02/12/22 0723 02/12/22 0815 02/12/22 1602  BP: 126/70 136/84  134/60  Pulse: 77 80  80  Resp: '18 19  19  '$ Temp: 97.6 F (36.4 C)     TempSrc: Oral     SpO2: 96% 99% 100% 96%  Weight:      Height:        Intake/Output Summary (Last  24 hours) at 02/12/2022 1610 Last data filed at 02/12/2022 0800 Gross per 24 hour  Intake 1560 ml  Output 1300 ml  Net 260 ml   Filed Weights   02/10/22 0500 02/11/22 0431 02/12/22 0427  Weight: 83 kg 80.7 kg 82.6 kg    Examination:  General exam: Alert.  NAD. Respiratory system: Lungs CTA B anterior lung fields.  No wheezes, no crackles, no rhonchi.  Fair air movement.  Speaking in full sentences.  Cardiovascular system: RRR no murmurs rubs or gallops.  No JVD.  No lower extremity edema. Gastrointestinal system: Abdomen is soft, nontender, nondistended, positive bowel sounds.  GJ tube with dressing intact at insertion site.  Central nervous system: Alert and oriented. No focal neurological deficits. Extremities: Symmetric 5 x 5 power. Skin: No rashes, lesions or ulcers Psychiatry: Judgement and insight appear normal. Mood & affect appropriate.     Data Reviewed:   CBC: Recent Labs  Lab 02/08/22 0058 02/09/22 0032 02/09/22 0610 02/09/22 1504 02/10/22 0412 02/11/22 0444 02/12/22 0417  WBC 5.9 4.5  --   --  5.3 4.3 3.9*  NEUTROABS 3.6 2.5  --   --  3.9 2.5 2.1  HGB 7.5* 7.1* 7.0* 7.6* 8.3* 7.9* 8.0*  HCT 24.9* 22.9* 22.3* 24.7* 26.1* 25.4* 25.0*  MCV 102.5* 100.9*  --   --  98.9 100.8* 99.2  PLT 121* 118*  --   --  124* 123* 114*    Basic Metabolic Panel: Recent Labs  Lab 02/08/22 0058 02/09/22 0032 02/10/22 0412 02/11/22 0444 02/12/22 0417  NA 132* 138 137 136 136  K 4.5 4.7 4.7 4.8 4.3  CL 102 103 103 102 107  CO2 '24 27 26 27 24  '$ GLUCOSE 136* 131* 107* 146* 133*  BUN 41* 46* 41* 38* 34*  CREATININE 0.64 0.63 0.68 0.73 0.70  CALCIUM 8.7* 9.1 9.2 9.0 8.9    GFR: Estimated Creatinine Clearance: 70.6 mL/min (by C-G formula based on SCr of 0.7 mg/dL).  Liver Function Tests: No results for input(s): "AST", "ALT", "ALKPHOS", "BILITOT", "PROT", "ALBUMIN" in the last 168 hours.   CBG: Recent Labs  Lab 02/11/22 1226 02/11/22 1646 02/11/22 2318  02/12/22 0503 02/12/22 1143  GLUCAP 128* 134* 172* 129* 199*     Recent Results (from the past 240 hour(s))  Urine Culture     Status: Abnormal   Collection Time: 02/05/22  8:26 AM   Specimen: Urine, Clean Catch  Result Value Ref Range Status   Specimen Description URINE, CLEAN CATCH  Final   Special Requests   Final    NONE Performed at [facility], [place] [address], [place], [place] [address]    Culture (A)  Final    40,000 COLONIES/mL ENTEROCOCCUS FAECIUM VANCOMYCIN RESISTANT ENTEROCOCCUS ISOLATED    Report Status 02/07/2022 FINAL  Final   Organism ID, Bacteria ENTEROCOCCUS FAECIUM (A)  Final  Susceptibility   Enterococcus faecium - MIC*    AMPICILLIN >=32 RESISTANT Resistant     NITROFURANTOIN 64 INTERMEDIATE Intermediate     VANCOMYCIN >=32 RESISTANT Resistant     LINEZOLID 2 SENSITIVE Sensitive     * 40,000 COLONIES/mL ENTEROCOCCUS FAECIUM          Radiology Studies: No results found.      Scheduled Meds:  acetaminophen  1,000 mg Per Tube Q8H   amiodarone  200 mg Per Tube Daily   Chlorhexidine Gluconate Cloth  6 each Topical Q0600   doxazosin  1 mg Per Tube Daily   famotidine  20 mg Per Tube BID   feeding supplement (JEVITY 1.5 CAL/FIBER)  1,000 mL Per Tube Q24H   feeding supplement (PROSource TF)  45 mL Per Tube Daily   fluticasone furoate-vilanterol  1 puff Inhalation Daily   free water  120 mL Per Tube Q4H   linezolid  600 mg Per Tube Q12H   nutrition supplement (JUVEN)  1 packet Per Tube BID BM   Zinc Oxide   Topical BID   Continuous Infusions:      LOS: 19 days    Time spent: 40 minutes    Irine Seal, MD [facility]   To contact the attending provider between 7A-7P or the covering provider during after hours 7P-7A, please log into the web site [email or web address] and access using universal [place] password for that web site. If you do not have the password, please call the hospital operator.  02/12/2022, 4:10 PM

## 2022-02-13 DIAGNOSIS — D649 Anemia, unspecified: Secondary | ICD-10-CM | POA: Diagnosis not present

## 2022-02-13 DIAGNOSIS — L03818 Cellulitis of other sites: Secondary | ICD-10-CM | POA: Diagnosis not present

## 2022-02-13 DIAGNOSIS — L03311 Cellulitis of abdominal wall: Secondary | ICD-10-CM | POA: Diagnosis not present

## 2022-02-13 DIAGNOSIS — R7989 Other specified abnormal findings of blood chemistry: Secondary | ICD-10-CM | POA: Diagnosis not present

## 2022-02-13 LAB — CBC
HCT: 25.2 % — ABNORMAL LOW (ref 39.0–52.0)
Hemoglobin: 8.2 g/dL — ABNORMAL LOW (ref 13.0–17.0)
MCH: 32.2 pg (ref 26.0–34.0)
MCHC: 32.5 g/dL (ref 30.0–36.0)
MCV: 98.8 fL (ref 80.0–100.0)
Platelets: 109 10*3/uL — ABNORMAL LOW (ref 150–400)
RBC: 2.55 MIL/uL — ABNORMAL LOW (ref 4.22–5.81)
RDW: 19.4 % — ABNORMAL HIGH (ref 11.5–15.5)
WBC: 3.9 10*3/uL — ABNORMAL LOW (ref 4.0–10.5)
nRBC: 0 % (ref 0.0–0.2)

## 2022-02-13 LAB — BASIC METABOLIC PANEL
Anion gap: 7 (ref 5–15)
BUN: 35 mg/dL — ABNORMAL HIGH (ref 8–23)
CO2: 23 mmol/L (ref 22–32)
Calcium: 8.6 mg/dL — ABNORMAL LOW (ref 8.9–10.3)
Chloride: 98 mmol/L (ref 98–111)
Creatinine, Ser: 0.77 mg/dL (ref 0.61–1.24)
GFR, Estimated: >60 mL/min (ref >60)
Glucose, Bld: 187 mg/dL — ABNORMAL HIGH (ref 70–99)
Potassium: 4.1 mmol/L (ref 3.5–5.1)
Sodium: 128 mmol/L — ABNORMAL LOW (ref 135–145)

## 2022-02-13 LAB — GLUCOSE, CAPILLARY
Glucose-Capillary: 123 mg/dL — ABNORMAL HIGH (ref 70–99)
Glucose-Capillary: 145 mg/dL — ABNORMAL HIGH (ref 70–99)
Glucose-Capillary: 164 mg/dL — ABNORMAL HIGH (ref 70–99)
Glucose-Capillary: 170 mg/dL — ABNORMAL HIGH (ref 70–99)
Glucose-Capillary: 188 mg/dL — ABNORMAL HIGH (ref 70–99)

## 2022-02-13 LAB — OSMOLALITY: Osmolality: 301 mOsm/kg — ABNORMAL HIGH (ref 275–295)

## 2022-02-13 LAB — SODIUM, URINE, RANDOM: Sodium, Ur: 36 mmol/L

## 2022-02-13 LAB — OSMOLALITY, URINE: Osmolality, Ur: 321 mOsm/kg (ref 300–900)

## 2022-02-13 LAB — CREATININE, URINE, RANDOM: Creatinine, Urine: 18.16 mg/dL

## 2022-02-13 MED ORDER — VENLAFAXINE HCL 25 MG PO TABS
12.5000 mg | ORAL_TABLET | Freq: Two times a day (BID) | ORAL | Status: DC
Start: 2022-02-13 — End: 2022-02-13
  Filled 2022-02-13 (×2): qty 0.5

## 2022-02-13 MED ORDER — SODIUM CHLORIDE 0.9 % IV SOLN
INTRAVENOUS | Status: AC
Start: 2022-02-13 — End: 2022-02-15

## 2022-02-13 NOTE — Progress Notes (Signed)
PROGRESS NOTE    [name]  QIW:[id number] DOB: [birth year] DOA: 01/24/2022 PCP: [name], DO    Chief Complaint  Patient presents with   feeding tube issue     Brief Narrative:  [name] is a 88 y.o. male with medical history significant of paroxysmal atrial fibrillation, chronic systolic CHF, CAD s/p CABG, sick sinus syndrome s/p pacemaker, COPD, type 2 diabetes, hypertension, hyperlipidemia, chronic thrombocytopenia, recurrent UTI, OSA not on CPAP, dysphagia with PEG tube who presents with concerns of purulent discharge from feeding tube.  S/p upsizing of g tube on 6/3.  Surgery c/s and recommended keeping g tube to gravity to ensure no pressure of phlange on skin and balloon not pulled up against intraabdominal wall.  Per Dr. Pascal Lux, suspected pressure ulcer about medial aspect of g tube insertion - likely due to insertion angle of tube and external retention disc being too tight. General surgery is following periodically. They recommended keeping G tube to gravity to ensure no pressure on skin and balloon not pulled up against intraabdominal wall.   He underwent cortrak placement for tube feeding. For the next several days, patient was continued on core track feeding while G tube was site was monitored for healing. 6/19, patient underwent image guided conversion of G-tube to [place] feeding tube.  Per IR note, the t-tack/mesh conglomerate was sharply excised. See below for details     Assessment & Plan:  Principal Problem:   Cellulitis Active Problems:   Hypotension   Paroxysmal atrial fibrillation (HCC)   HFrEF (heart failure with reduced ejection fraction) (HCC)   C6 cervical fracture (HCC)   Controlled type 2 diabetes mellitus without complication, without long-term current use of insulin (HCC)   Delirium   S/P CABG x 3   Hypernatremia   Dysphagia   Anemia   Urinary retention   Diarrhea   Abnormal LFTs   Pressure ulcer   Goals of care,  counseling/discussion   Status post insertion of percutaneous endoscopic gastrostomy (PEG) tube ([place])   Hematuria    Assessment and Plan: * Cellulitis Pt has dysphagia and is s/p PEG tube placement on 5/24.  Presents with malodor and purulent discharge from PEG tube site. CT abd/pelvis showed 6.4x2.2 cm area of inflammatory/infectious phlegmon in subcutaneous plaine at site of placement of gastrostomy, pockets of air in the subcutaneous plane at the site of g tube placement (due to open wound in skin or suggest infection with gas producing organisms).  No definite demonstrable thick walled loculated fluid collection. S/p fluoroscopic guided replacement and up sizing of now 24 fr ballon retention gastrostomy tube 6/3 per Dr. Pascal Lux, suspected pressure ulcer about medial aspect of g tube insertion - likely due to insertion angle of tube and external retention disc being too tight  - g tube to gravity  - local wound care (zinc oxide to intact portion of epidermis, dressing changes around g tube 4x daily)  - post pyloric nutrition  - wound care  - available as needed, IR can follow up prn as well Issue at this time is g tube is to gravity and he has cortrak while we're allowing wound to heal.  It's not clear how long he'll need g tube to gravity and post pyloric feeding at this point per general surgery early on during the hospitalization.  -Status post full course Ancef. (he's tolerated cephalosporins and augmentin in past per pharmacy) -Wound cultures  (moderate staph epi, moderate candida albicans - not sure how  clinically relevant these are - will follow) -blood cultures negative. -Wound care consulted, appreciate recs - medi honey added -On 01/29/2022 in the evening it was noted per RN and daughter that wound had looked worse and crossed, came and assessed the patient and IV antibiotics broadened back to vancomycin and Merrem. -Patient reassessed by general surgery again 01/30/2022 and stating  wound from a pressure necrosis from his G-tube, recommending continued current wound care, de-escalation of antibiotics, and if wound is not shrinking over the next 1 to 2 weeks other considerations will need to be entertained at that time.  - IV antibiotics narrowed back to Ancef and patient completed a 10-day course of antibiotic treatment. -6/19 patient underwent image guided conversion of G-tube to [place] feeding tube.  Per IR note that T tack mesh conglomerate was sharply excised. -Continue current local wound care. -Per general surgery no need for any systemic antibiotics at this time and general surgery following. -Tube feeds started dietitian following. -Cortrack discontinued. -Appreciate general surgery's input and recommendations.  Hypotension Presented with hypotension with SBP of 90 over 50s.  This has improved with fluid but remains soft.  Previously on Lasix, Entresto, Aldactone and beta-blockers at home but these were hold during last admission.   -Continue to hold hypertensive medications secondary to low blood pressure. -Saline lock IV fluids.  -repeat echo -> EF 40-45% - BP overall improved, follow  C6 cervical fracture ([place]) This is from a ground-level fall back in April.  Was on a soft cervical collar and continue follow-up with neurosurgery. Discussed with Dr. Ellene Route, he notes ok to d/c c collar Flex ex films  with known subacute fx through anterior inferior aspect of c 6 vertebral body, anterior osteophyte not well seen, C6-7 space normally aligned without abnormal motion on extension, not well assessed on flexion (see report) -Patient seen by neurosurgery who states C6 fracture is healed and patient can be removed from collar and pursue activities as tolerated.  HFrEF (heart failure with reduced ejection fraction) ([place]) Appears euvolemic on exam. Echo as above, -Prior to admission patient noted to be on Lasix, Entresto, Aldactone and beta-blockers which are currently on  hold due to soft blood pressure.  Paroxysmal atrial fibrillation (HCC) -Rate controlled on amiodarone.   -Eliquis held due to ongoing hematuria. -Urology to advise when Eliquis may be resumed.  Delirium Delirium precautions -Improved.  Controlled type 2 diabetes mellitus without complication, without long-term current use of insulin (HCC) Controlled.  Last A1c 5.2% on 5/15. -CBG 170 this morning.   -Follow.  Hypernatremia - Likely secondary to dehydration.   -Improved with hydration.   -Patient placed on gentle hydration 01/31/2022 due to malposition of cortrack tube. -Cortrack placed back and was in position as such IV fluids discontinued.   -Patient now with a GJ tube and core track discontinued and patient on tube feeds.   -Sodium at 128 this morning. -Follow.  S/P CABG x 3 Stable and asymptomatic from cardiac standpoint.  Anemia  Anemia labs c/w aocd, iron def -Patient with hematuria which has been ongoing. -Status post transfusion 1 unit packed red blood cells 02/09/2022. -Hemoglobin stable at 8.2. -Transfusion threshold hemoglobin < 7.    Dysphagia S/p PEG tube by VIR on 5/24, s/p replacement as above -Dietitian consulted for tube feeding  -The afternoon of 01/31/2022, concerned that tube feeds coming out of PEG tube site and concern for malposition of cortrack. -Abdominal films obtained and feeding tube tip noted at the position of the pylorus  directed towards the duodenum. -Advanced cortrack 4 cm, 02/01/2022 with repeat abdominal films showing appropriate placement of cortrack tube.  -Resumed tube feeds. -IV fluids have been discontinued. -6/19 patient underwent image guided conversion of G-tube to [place] feeding tube. -Tube feeds resumed with dietitian following. -Cortrack discontinued -Dietitian following and appreciate their input and recommendations.  Urinary retention Foley catheter placed on 5/27 due to urinary retention at recent admission.  He has planned  outpatient follow-up with urology. -Patient underwent successful voiding trial.  -Urine output 1100 cc over the past 24 hours. -Patient with good urine output post Foley catheter removal with hematuria noted.  -Continue doxazosin. -Monitor urine output. -Urology following.  Diarrhea Daughter reported persistent diarrhea since the start of tube feed on 5/25.  -Tube feeds changed with some improvement with diarrhea. Negative GI pathogen panel Negative c diff  Abnormal LFTs Mildly elevated -LFTs trending down. -Follow-up.  Pressure ulcer Pressure Injury 01/24/22 Buttocks Deep Tissue Pressure Injury - Purple or maroon localized area of discolored intact skin or blood-filled blister due to damage of underlying soft tissue from pressure and/or shear. (Active)  01/24/22 2036  Location: Buttocks  Location Orientation:   Staging: Deep Tissue Pressure Injury - Purple or maroon localized area of discolored intact skin or blood-filled blister due to damage of underlying soft tissue from pressure and/or shear.  Wound Description (Comments):   Present on Admission: Yes   Wound c/s   Goals of care, counseling/discussion Not currently interested in palliative care discussions, noted we can review pending course  Hematuria - Patient with hematuria noted since 02/04/2022. -Patient with history of prostate cancer status postradiation under the care of Dr. Jeffie Pollock. -Patient denies any abdominal pain, no dysuria, no nausea, no vomiting, no CVA tenderness. -Per daughter patient with prior history of nephrolithiasis. -Renal ultrasound with no hydronephrosis or shadowing stone, bilateral renal cyst, minimal bladder debris.   -Urine cultures positive for VRE -Foley catheter initially placed with irrigation however with no significant improvement with hematuria and as such Foley catheter discontinued per urology recommendations. -Patient seen in consultation by urology, Dr. Jeffie Pollock on 02/10/2022 who  recommended removal of Foley catheter, treatment of UTI and monitoring hematuria off Eliquis for 1 to 2 days and if no significant improvement will likely need cystoscopy and possible fulguration.   -Hematuria was clearing out last night however per daughter had some hematuria this morning, canister emptied. -Patient started on Zyvox which we will continue.  -Supportive care.         DVT prophylaxis: SCDs Code Status: Full. Family Communication: Updated patient.  Updated daughter and wife at bedside.   Disposition: SNF  Status is: Inpatient Remains inpatient appropriate because: Severity of illness   Consultants:  Neurosurgery: Dr. Ellene Route 01/28/2022 General surgery: Dr. Kieth Brightly 01/26/2022 Wound care RN Urology: Dr. Jeffie Pollock 02/10/2022   Procedures:  CT abdomen and pelvis 01/24/2022 Plain films of the cervical spine 01/27/2022 Fluoroscopic guided replacement of gastrostomy tube with upsizing 01/25/2022 interventional radiology 2D echo 01/27/2022 Renal ultrasound 02/04/2022 MH guided conversion of G-tube to [place] feeding tube, 24 F GJ placed by IR, Dr. Earleen Newport 02/10/2022    Antimicrobials:  Diflucan 01/25/2022>>>> 02/02/2022 IV Ancef 01/27/2022>>>>> 02/02/2022 IV Rocephin 01/27/2022 x 1 dose IV Merrem 01/24/2022>>>> 01/27/2022 IV vancomycin 01/24/2022>>>>> 01/27/2022 IV Zyvox 02/11/2022>>    Subjective: Sitting up in chair complaining that he is too low and needs to be pulled up.  No chest pain.  No shortness of breath.  Daughter and wife at bedside.  Per daughter.  Took some pictures yesterday hematuria improved yesterday evening however this morning noted to have some hematuria per daughter.  Canister emptied.  Daughter feels we need to continue to hold Effexor at this time.  Objective: Vitals:   02/13/22 0454 02/13/22 0838 02/13/22 0952 02/13/22 1100  BP: (!) 108/55 (!) 114/56    Pulse: 77 76    Resp: 17 18    Temp: 97.7 F (36.5 C) 97.9 F (36.6 C)    TempSrc: Oral Oral    SpO2: 97% 99% 97%    Weight: 83.9 kg   79.4 kg  Height:        Intake/Output Summary (Last 24 hours) at 02/13/2022 1727 Last data filed at 02/13/2022 1523 Gross per 24 hour  Intake 919.61 ml  Output 1100 ml  Net -180.39 ml   Filed Weights   02/12/22 0427 02/13/22 0454 02/13/22 1100  Weight: 82.6 kg 83.9 kg 79.4 kg    Examination:  General exam: Alert.  NAD. Respiratory system: Lungs clear to auscultation bilaterally anterior lung fields.  No wheezes, no crackles, no rhonchi.  Fair air movement.  Speaking in full sentences.  Cardiovascular system: Regular rate rhythm no murmurs rubs or gallops.  No JVD.  No lower extremity edema. Gastrointestinal system: Abdomen is soft, nontender, nondistended, positive bowel sounds.  GJ tube with dressing intact at insertion site.  Central nervous system: Alert and oriented. No focal neurological deficits. Extremities: Symmetric 5 x 5 power. Skin: No rashes, lesions or ulcers Psychiatry: Judgement and insight appear normal. Mood & affect appropriate.     Data Reviewed:   CBC: Recent Labs  Lab 02/08/22 0058 02/09/22 0032 02/09/22 0610 02/09/22 1504 02/10/22 0412 02/11/22 0444 02/12/22 0417 02/13/22 0101  WBC 5.9 4.5  --   --  5.3 4.3 3.9* 3.9*  NEUTROABS 3.6 2.5  --   --  3.9 2.5 2.1  --   HGB 7.5* 7.1*   < > 7.6* 8.3* 7.9* 8.0* 8.2*  HCT 24.9* 22.9*   < > 24.7* 26.1* 25.4* 25.0* 25.2*  MCV 102.5* 100.9*  --   --  98.9 100.8* 99.2 98.8  PLT 121* 118*  --   --  124* 123* 114* 109*   < > = values in this interval not displayed.    Basic Metabolic Panel: Recent Labs  Lab 02/09/22 0032 02/10/22 0412 02/11/22 0444 02/12/22 0417 02/13/22 0101  NA 138 137 136 136 128*  K 4.7 4.7 4.8 4.3 4.1  CL 103 103 102 107 98  CO2 '27 26 27 24 23  '$ GLUCOSE 131* 107* 146* 133* 187*  BUN 46* 41* 38* 34* 35*  CREATININE 0.63 0.68 0.73 0.70 0.77  CALCIUM 9.1 9.2 9.0 8.9 8.6*    GFR: Estimated Creatinine Clearance: 70.6 mL/min (by C-G formula based on SCr of  0.77 mg/dL).  Liver Function Tests: No results for input(s): "AST", "ALT", "ALKPHOS", "BILITOT", "PROT", "ALBUMIN" in the last 168 hours.   CBG: Recent Labs  Lab 02/12/22 1143 02/12/22 1745 02/13/22 0008 02/13/22 0608 02/13/22 1213  GLUCAP 199* 129* 145* 170* 164*     Recent Results (from the past 240 hour(s))  Urine Culture     Status: Abnormal   Collection Time: 02/05/22  8:26 AM   Specimen: Urine, Clean Catch  Result Value Ref Range Status   Specimen Description URINE, CLEAN CATCH  Final   Special Requests   Final    NONE Performed at [facility], [place] [address], [place], [place]  [address]    Culture (A)  Final    40,000 COLONIES/mL ENTEROCOCCUS FAECIUM VANCOMYCIN RESISTANT ENTEROCOCCUS ISOLATED    Report Status 02/07/2022 FINAL  Final   Organism ID, Bacteria ENTEROCOCCUS FAECIUM (A)  Final      Susceptibility   Enterococcus faecium - MIC*    AMPICILLIN >=32 RESISTANT Resistant     NITROFURANTOIN 64 INTERMEDIATE Intermediate     VANCOMYCIN >=32 RESISTANT Resistant     LINEZOLID 2 SENSITIVE Sensitive     * 40,000 COLONIES/mL ENTEROCOCCUS FAECIUM          Radiology Studies: No results found.      Scheduled Meds:  acetaminophen  1,000 mg Per Tube Q8H   amiodarone  200 mg Per Tube Daily   Chlorhexidine Gluconate Cloth  6 each Topical Q0600   doxazosin  1 mg Per Tube Daily   famotidine  20 mg Per Tube BID   feeding supplement (JEVITY 1.5 CAL/FIBER)  1,000 mL Per Tube Q24H   feeding supplement (PROSource TF)  45 mL Per Tube Daily   fluticasone furoate-vilanterol  1 puff Inhalation Daily   free water  120 mL Per Tube Q4H   nutrition supplement (JUVEN)  1 packet Per Tube BID BM   Zinc Oxide   Topical BID   Continuous Infusions:  sodium chloride 100 mL/hr at 02/13/22 1513   linezolid (ZYVOX) IV Stopped (02/13/22 1203)       LOS: 20 days    Time spent: 35 minutes    Irine Seal, MD [facility]   To contact the attending  provider between 7A-7P or the covering provider during after hours 7P-7A, please log into the web site [email or web address] and access using universal [facility] password for that web site. If you do not have the password, please call the hospital operator.  02/13/2022, 5:27 PM

## 2022-02-13 NOTE — TOC CM/SW Note (Signed)
Patient cannot change body position in bed without assistance .

## 2022-02-13 NOTE — Progress Notes (Signed)
Physical Therapy Treatment Patient Details Name: [name] MRN: [id number] DOB: 09-Jul-1933 Today's Date: 02/13/2022   History of Present Illness [name] is a 88 y.o. male who presented with purulent discharge from feeding tube. Due to likely pressure wound related to G tube, IR upsized G tube on 6/3 and cortrak was placed for temporary nutrition alternative. PMH: atrial fibrillation, chronic systolic CHF, CAD s/p CABG, sick sinus syndrome s/p pacemaker, COPD, type 2 diabetes, hypertension, hyperlipidemia, chronic thrombocytopenia, recurrent UTI, OSA not on CPAP, dysphagia with PEG tube , hx of C6 fx    PT Comments    Pt admitted with above diagnosis. Pt continues to make slow progress toward goals.  Pt varies from treatment session to treatment session as well.  Will decr frequency to 2 x week given slow progress and continue to work with pt so that pt can go home with daughter to assist. Daughter will need to use hoyer lift to get pt OOB.   Pt currently with functional limitations due to balance and endurance deficits. Pt will benefit from skilled PT to increase their independence and safety with mobility to allow discharge to the venue listed below.      Recommendations for follow up therapy are one component of a multi-disciplinary discharge planning process, led by the attending physician.  Recommendations may be updated based on patient status, additional functional criteria and insurance authorization.  Follow Up Recommendations  Home health PT     Assistance Recommended at Discharge Frequent or constant Supervision/Assistance  Patient can return home with the following Two people to help with walking and/or transfers;Two people to help with bathing/dressing/bathroom;Assistance with cooking/housework;Assistance with feeding;Direct supervision/assist for medications management;Assist for transportation;Direct supervision/assist for financial management;Help with stairs or ramp for  entrance   Equipment Recommendations  Wheelchair (measurements PT);Other (comment);Wheelchair cushion (measurements PT) (hoyer lift, air mattress overlay for bed)    Recommendations for Other Services  (family requesting)     Precautions / Restrictions Precautions Precautions: Fall;Other (comment) Precaution Booklet Issued: No Precaution Comments: G tube draining to gravity (ok to mobilize with therapy per MD); soft collar discontinued Restrictions Weight Bearing Restrictions: No RLE Weight Bearing: Weight bearing as tolerated LLE Weight Bearing: Weight bearing as tolerated     Mobility  Bed Mobility Overal bed mobility: Needs Assistance Bed Mobility: Supine to Sit Rolling: Max assist Sidelying to sit: Max assist Supine to sit: +2 for physical assistance, HOB elevated, Max assist Sit to supine: Max assist Sit to sidelying: Max assist General bed mobility comments: Pt able to roll to side and swing legs off with mod assist. +2 needed to lift trunk effectively    Transfers Overall transfer level: Needs assistance Equipment used: Ambulation equipment used Transfers: Sit to/from Stand, Bed to chair/wheelchair/BSC Sit to Stand: Max assist, +2 physical assistance, From elevated surface           General transfer comment: Pt only able to complete 2 trials of standing in [place] with pt difficulty with trunk extension requiring heavy Max A  x2 with use of pad  to complete. Pt also with difficulty keeping feet apart during standing attempts (not noted in bed or in chair). Transferred to recliner via Charlaine Dalton per daughter request Transfer via Lift Equipment: [place]  Ambulation/Gait               General Gait Details: unable today   Chief Strategy Officer  Modified Rankin (Stroke Patients Only)       Balance Overall balance assessment: Needs assistance Sitting-balance support: No upper extremity supported, Feet supported, Single extremity  supported, Bilateral upper extremity supported Sitting balance-Leahy Scale: Fair Sitting balance - Comments: Pt requiring mod to max assist to sit EOB with right lean and unable to progress to no UE support ; posterior and right bias. Postural control: Right lateral lean, Posterior lean Standing balance support: Bilateral upper extremity supported, Reliant on assistive device for balance Standing balance-Leahy Scale: Zero Standing balance comment: able to barely clear buttocks off bed with use of bed pads and max assist of 2                            Cognition Arousal/Alertness: Awake/alert Behavior During Therapy: Flat affect, Restless, Anxious Overall Cognitive Status: Impaired/Different from baseline Area of Impairment: Attention, Memory, Following commands, Safety/judgement, Awareness, Problem solving, Orientation                 Orientation Level: Disoriented to, Place, Time, Situation Current Attention Level: Sustained Memory: Decreased recall of precautions, Decreased short-term memory Following Commands: Follows one step commands with increased time Safety/Judgement: Decreased awareness of safety, Decreased awareness of deficits Awareness: Intellectual Problem Solving: Slow processing, Decreased initiation, Requires verbal cues, Requires tactile cues General Comments: Pt follows commands relatively consistently though yells out at times inconsistently related to pain.        Exercises General Exercises - Lower Extremity Ankle Circles/Pumps: AROM, Both, 10 reps Long Arc Quad: AROM, Both, 10 reps, Seated Heel Slides: AAROM, Both, Supine, 10 reps    General Comments General comments (skin integrity, edema, etc.): Daughter present      Pertinent Vitals/Pain Pain Assessment Pain Assessment: Faces Faces Pain Scale: Hurts little more Breathing: normal Negative Vocalization: none Facial Expression: smiling or inexpressive Body Language:  relaxed Consolability: no need to console PAINAD Score: 0 Pain Location: abdomen, legs after standing Pain Descriptors / Indicators: Guarding, Grimacing Pain Intervention(s): Limited activity within patient's tolerance, Monitored during session, Repositioned    Home Living                          Prior Function            PT Goals (current goals can now be found in the care plan section) Acute Rehab PT Goals Patient Stated Goal: to eventually return home Progress towards PT goals: Not progressing toward goals - comment (Has madeslow progress over last several weeeks of therapy)    Frequency    Min 2X/week      PT Plan Frequency needs to be updated    Co-evaluation              AM-PAC PT "6 Clicks" Mobility   Outcome Measure  Help needed turning from your back to your side while in a flat bed without using bedrails?: Total Help needed moving from lying on your back to sitting on the side of a flat bed without using bedrails?: Total Help needed moving to and from a bed to a chair (including a wheelchair)?: Total Help needed standing up from a chair using your arms (e.g., wheelchair or bedside chair)?: Total Help needed to walk in hospital room?: Total Help needed climbing 3-5 steps with a railing? : Total 6 Click Score: 6    End of Session Equipment Utilized During Treatment: Gait belt Activity Tolerance: Patient limited  by pain;Patient limited by fatigue Patient left: in chair;with call bell/phone within reach;with family/visitor present Nurse Communication: Mobility status;Need for lift equipment PT Visit Diagnosis: History of falling (Z91.81);Pain;Muscle weakness (generalized) (M62.81);Unsteadiness on feet (R26.81) Pain - Right/Left: Right Pain - part of body: Hip     Time: 3220-2542 PT Time Calculation (min) (ACUTE ONLY): 23 min  Charges:  $Therapeutic Exercise: 8-22 mins $Therapeutic Activity: 8-22 mins                     [facility]  M,PT Acute Rehab Services [phone]    Alvira Philips 02/13/2022, 2:38 PM

## 2022-02-13 NOTE — TOC Progression Note (Signed)
Transition of Care [facility]) - Progression Note    Patient Details  Name: [name] MRN: [id number] Date of Birth: April 18, 1933  Transition of Care [facility]) CM/SW Contact  Jacalyn Lefevre Edson Snowball, RN Phone Number: 02/13/2022, 11:57 AM  Clinical Narrative:    Damaris Schooner to dietitian and daughter [name] . Plan at discharge is cyclic tube feeds if patient can tolerate the higher rate . WIll wait and order home tube feeds once determination made.   Recommendation for air mattress overlay and hoyer lift . [name] is contact person for delivery her cell [phone] does not always work if she is at her parents second number is [phone]   Patient has hospital bed already. Called [employer] .  Letta Kocher will call back with list of required documentation needed.   Expected Discharge Plan: [place] Barriers to Discharge: Continued Medical Work up  Expected Discharge Plan and Services Expected Discharge Plan: [place]   Discharge Planning Services: CM Consult Post Acute Care Choice: [place] arrangements for the past 2 months: Single Family Home                 DME Arranged: Tube feeding DME Agency: [facility] Date DME Agency Contacted: 01/27/22 Time DME Agency Contacted: [id number] Representative spoke with at DME Agency: Letta Kocher await new orders HH Arranged: RN, PT [place] Agency: [place] Date [place]: 01/27/22 Time [place]: [place] Representative spoke with at [place]: [place] ([place]) Interventions    Readmission Risk Interventions    01/16/2022    2:12 PM  Readmission Risk Prevention Plan  Transportation Screening Complete  Medication Review Press photographer) Referral to Pharmacy  PCP or Specialist appointment within 3-5 days of discharge Complete  HRI or [place] Complete  SW Recovery Care/Counseling Consult Complete  [place] Not Applicable

## 2022-02-14 DIAGNOSIS — L03818 Cellulitis of other sites: Secondary | ICD-10-CM | POA: Diagnosis not present

## 2022-02-14 DIAGNOSIS — R7989 Other specified abnormal findings of blood chemistry: Secondary | ICD-10-CM | POA: Diagnosis not present

## 2022-02-14 DIAGNOSIS — D649 Anemia, unspecified: Secondary | ICD-10-CM | POA: Diagnosis not present

## 2022-02-14 DIAGNOSIS — L03311 Cellulitis of abdominal wall: Secondary | ICD-10-CM | POA: Diagnosis not present

## 2022-02-14 LAB — CBC
HCT: 27 % — ABNORMAL LOW (ref 39.0–52.0)
Hemoglobin: 8.5 g/dL — ABNORMAL LOW (ref 13.0–17.0)
MCH: 32 pg (ref 26.0–34.0)
MCHC: 31.5 g/dL (ref 30.0–36.0)
MCV: 101.5 fL — ABNORMAL HIGH (ref 80.0–100.0)
Platelets: 101 10*3/uL — ABNORMAL LOW (ref 150–400)
RBC: 2.66 MIL/uL — ABNORMAL LOW (ref 4.22–5.81)
RDW: 19.3 % — ABNORMAL HIGH (ref 11.5–15.5)
WBC: 3.6 10*3/uL — ABNORMAL LOW (ref 4.0–10.5)
nRBC: 0 % (ref 0.0–0.2)

## 2022-02-14 LAB — BASIC METABOLIC PANEL
Anion gap: 4 — ABNORMAL LOW (ref 5–15)
BUN: 27 mg/dL — ABNORMAL HIGH (ref 8–23)
CO2: 24 mmol/L (ref 22–32)
Calcium: 8.7 mg/dL — ABNORMAL LOW (ref 8.9–10.3)
Chloride: 108 mmol/L (ref 98–111)
Creatinine, Ser: 0.66 mg/dL (ref 0.61–1.24)
GFR, Estimated: >60 mL/min (ref >60)
Glucose, Bld: 150 mg/dL — ABNORMAL HIGH (ref 70–99)
Potassium: 4.3 mmol/L (ref 3.5–5.1)
Sodium: 136 mmol/L (ref 135–145)

## 2022-02-14 LAB — GLUCOSE, CAPILLARY
Glucose-Capillary: 150 mg/dL — ABNORMAL HIGH (ref 70–99)
Glucose-Capillary: 145 mg/dL — ABNORMAL HIGH (ref 70–99)

## 2022-02-14 MED ORDER — JEVITY 1.5 CAL/FIBER PO LIQD
1000.0000 mL | ORAL | Status: DC
Start: 2022-02-14 — End: 2022-02-19
  Administered 2022-02-14 – 2022-02-18 (×6): 1000 mL
  Filled 2022-02-14 (×8): qty 1000

## 2022-02-14 MED ORDER — VENLAFAXINE HCL 37.5 MG PO TABS
18.2500 mg | ORAL_TABLET | Freq: Two times a day (BID) | ORAL | Status: DC
  Administered 2022-02-14 – 2022-02-18 (×8): 18.75 mg
  Filled 2022-02-14 (×9): qty 0.5

## 2022-02-14 NOTE — Progress Notes (Signed)
Mobility Specialist Progress Note   02/14/22 1802  Mobility  Activity Transferred from chair to bed  Level of Assistance +2 (takes two people) (+3(Physical Assist))  Assistive Device  (HHA)  Activity Response Tolerated fair  $Mobility charge 1 Mobility   RN requesting assistance to get pt back to bed after pt voicing discomfort and multiple repositioning attempts. +3 modA to laterally scoot pt from chair to bed, pt presenting w/ decreased pain tolerance during tx. Left in bed w/ RN and NT attending to pt's pain.   Frederico Hamman Mobility Specialist Phone Number [phone]

## 2022-02-15 DIAGNOSIS — R7989 Other specified abnormal findings of blood chemistry: Secondary | ICD-10-CM | POA: Diagnosis not present

## 2022-02-15 DIAGNOSIS — D649 Anemia, unspecified: Secondary | ICD-10-CM | POA: Diagnosis not present

## 2022-02-15 DIAGNOSIS — L03311 Cellulitis of abdominal wall: Secondary | ICD-10-CM | POA: Diagnosis not present

## 2022-02-15 DIAGNOSIS — L03818 Cellulitis of other sites: Secondary | ICD-10-CM | POA: Diagnosis not present

## 2022-02-15 LAB — CBC
HCT: 24.3 % — ABNORMAL LOW (ref 39.0–52.0)
Hemoglobin: 7.3 g/dL — ABNORMAL LOW (ref 13.0–17.0)
MCH: 30.4 pg (ref 26.0–34.0)
MCHC: 30 g/dL (ref 30.0–36.0)
MCV: 101.3 fL — ABNORMAL HIGH (ref 80.0–100.0)
Platelets: 97 10*3/uL — ABNORMAL LOW (ref 150–400)
RBC: 2.4 MIL/uL — ABNORMAL LOW (ref 4.22–5.81)
RDW: 19.4 % — ABNORMAL HIGH (ref 11.5–15.5)
WBC: 3.4 10*3/uL — ABNORMAL LOW (ref 4.0–10.5)
nRBC: 0 % (ref 0.0–0.2)

## 2022-02-15 LAB — BASIC METABOLIC PANEL
Anion gap: 6 (ref 5–15)
BUN: 31 mg/dL — ABNORMAL HIGH (ref 8–23)
CO2: 22 mmol/L (ref 22–32)
Calcium: 8.5 mg/dL — ABNORMAL LOW (ref 8.9–10.3)
Chloride: 109 mmol/L (ref 98–111)
Creatinine, Ser: 0.56 mg/dL — ABNORMAL LOW (ref 0.61–1.24)
GFR, Estimated: >60 mL/min (ref >60)
Glucose, Bld: 160 mg/dL — ABNORMAL HIGH (ref 70–99)
Potassium: 4.1 mmol/L (ref 3.5–5.1)
Sodium: 137 mmol/L (ref 135–145)

## 2022-02-15 LAB — GLUCOSE, CAPILLARY
Glucose-Capillary: 102 mg/dL — ABNORMAL HIGH (ref 70–99)
Glucose-Capillary: 123 mg/dL — ABNORMAL HIGH (ref 70–99)
Glucose-Capillary: 132 mg/dL — ABNORMAL HIGH (ref 70–99)
Glucose-Capillary: 154 mg/dL — ABNORMAL HIGH (ref 70–99)
Glucose-Capillary: 181 mg/dL — ABNORMAL HIGH (ref 70–99)

## 2022-02-16 DIAGNOSIS — L03818 Cellulitis of other sites: Secondary | ICD-10-CM | POA: Diagnosis not present

## 2022-02-16 DIAGNOSIS — R7989 Other specified abnormal findings of blood chemistry: Secondary | ICD-10-CM | POA: Diagnosis not present

## 2022-02-16 DIAGNOSIS — D649 Anemia, unspecified: Secondary | ICD-10-CM | POA: Diagnosis not present

## 2022-02-16 DIAGNOSIS — L03311 Cellulitis of abdominal wall: Secondary | ICD-10-CM | POA: Diagnosis not present

## 2022-02-16 LAB — CBC
HCT: 24.1 % — ABNORMAL LOW (ref 39.0–52.0)
Hemoglobin: 7.5 g/dL — ABNORMAL LOW (ref 13.0–17.0)
MCH: 31.8 pg (ref 26.0–34.0)
MCHC: 31.1 g/dL (ref 30.0–36.0)
MCV: 102.1 fL — ABNORMAL HIGH (ref 80.0–100.0)
Platelets: 91 10*3/uL — ABNORMAL LOW (ref 150–400)
RBC: 2.36 MIL/uL — ABNORMAL LOW (ref 4.22–5.81)
RDW: 19.3 % — ABNORMAL HIGH (ref 11.5–15.5)
WBC: 4.1 10*3/uL (ref 4.0–10.5)
nRBC: 0 % (ref 0.0–0.2)

## 2022-02-16 LAB — BASIC METABOLIC PANEL
Anion gap: 6 (ref 5–15)
BUN: 34 mg/dL — ABNORMAL HIGH (ref 8–23)
CO2: 23 mmol/L (ref 22–32)
Calcium: 8.6 mg/dL — ABNORMAL LOW (ref 8.9–10.3)
Chloride: 107 mmol/L (ref 98–111)
Creatinine, Ser: 0.63 mg/dL (ref 0.61–1.24)
GFR, Estimated: >60 mL/min (ref >60)
Glucose, Bld: 136 mg/dL — ABNORMAL HIGH (ref 70–99)
Potassium: 4.3 mmol/L (ref 3.5–5.1)
Sodium: 136 mmol/L (ref 135–145)

## 2022-02-16 LAB — GLUCOSE, CAPILLARY: Glucose-Capillary: 98 mg/dL (ref 70–99)

## 2022-02-16 MED ORDER — LINEZOLID 600 MG PO TABS
600.0000 mg | ORAL_TABLET | Freq: Two times a day (BID) | ORAL | Status: AC
  Administered 2022-02-16 – 2022-02-17 (×2): 600 mg
  Filled 2022-02-16 (×3): qty 1

## 2022-02-16 MED ORDER — LINEZOLID 100 MG/5ML PO SUSR
600.0000 mg | Freq: Two times a day (BID) | ORAL | Status: DC
  Filled 2022-02-16: qty 30

## 2022-02-16 MED ORDER — LINEZOLID 100 MG/5ML PO SUSR
600.0000 mg | Freq: Two times a day (BID) | ORAL | Status: DC
Start: 2022-02-17 — End: 2022-02-17

## 2022-02-17 ENCOUNTER — Ambulatory Visit: Payer: Medicare Other | Admitting: Family Medicine

## 2022-02-17 DIAGNOSIS — L03311 Cellulitis of abdominal wall: Secondary | ICD-10-CM | POA: Diagnosis not present

## 2022-02-17 DIAGNOSIS — R7989 Other specified abnormal findings of blood chemistry: Secondary | ICD-10-CM | POA: Diagnosis not present

## 2022-02-17 DIAGNOSIS — L03818 Cellulitis of other sites: Secondary | ICD-10-CM | POA: Diagnosis not present

## 2022-02-17 DIAGNOSIS — D649 Anemia, unspecified: Secondary | ICD-10-CM | POA: Diagnosis not present

## 2022-02-17 LAB — CBC
HCT: 24.3 % — ABNORMAL LOW (ref 39.0–52.0)
Hemoglobin: 7.6 g/dL — ABNORMAL LOW (ref 13.0–17.0)
MCH: 31.5 pg (ref 26.0–34.0)
MCHC: 31.3 g/dL (ref 30.0–36.0)
MCV: 100.8 fL — ABNORMAL HIGH (ref 80.0–100.0)
Platelets: 86 10*3/uL — ABNORMAL LOW (ref 150–400)
RBC: 2.41 MIL/uL — ABNORMAL LOW (ref 4.22–5.81)
RDW: 19.4 % — ABNORMAL HIGH (ref 11.5–15.5)
WBC: 3.5 10*3/uL — ABNORMAL LOW (ref 4.0–10.5)
nRBC: 0 % (ref 0.0–0.2)

## 2022-02-17 LAB — BASIC METABOLIC PANEL
Anion gap: 5 (ref 5–15)
BUN: 36 mg/dL — ABNORMAL HIGH (ref 8–23)
CO2: 23 mmol/L (ref 22–32)
Calcium: 8.6 mg/dL — ABNORMAL LOW (ref 8.9–10.3)
Chloride: 103 mmol/L (ref 98–111)
Creatinine, Ser: 0.61 mg/dL (ref 0.61–1.24)
GFR, Estimated: >60 mL/min (ref >60)
Glucose, Bld: 152 mg/dL — ABNORMAL HIGH (ref 70–99)
Potassium: 4.4 mmol/L (ref 3.5–5.1)
Sodium: 131 mmol/L — ABNORMAL LOW (ref 135–145)

## 2022-02-17 LAB — GLUCOSE, CAPILLARY
Glucose-Capillary: 129 mg/dL — ABNORMAL HIGH (ref 70–99)
Glucose-Capillary: 144 mg/dL — ABNORMAL HIGH (ref 70–99)
Glucose-Capillary: 158 mg/dL — ABNORMAL HIGH (ref 70–99)
Glucose-Capillary: 93 mg/dL (ref 70–99)

## 2022-02-17 MED ORDER — ONDANSETRON 4 MG PO TBDP: 4.0000 mg | ORAL_TABLET | Freq: Three times a day (TID) | ORAL | Status: DC | PRN

## 2022-02-17 MED ORDER — LINEZOLID 100 MG/5ML PO SUSR
600.0000 mg | Freq: Two times a day (BID) | ORAL | Status: DC
  Administered 2022-02-17 – 2022-02-18 (×3): 600 mg
  Filled 2022-02-17 (×4): qty 30

## 2022-02-17 MED ORDER — ONDANSETRON HCL 4 MG PO TABS: 4.0000 mg | ORAL_TABLET | Freq: Three times a day (TID) | ORAL | Status: DC | PRN

## 2022-02-17 NOTE — Progress Notes (Addendum)
Physical Therapy Treatment Patient Details Name: [name] MRN: [id number] DOB: Dec 13, 1932 Today's Date: 02/17/2022   History of Present Illness [name] is a 88 y.o. male who presented with purulent discharge from feeding tube. Due to likely pressure wound related to G tube, IR upsized G tube on 6/3 and cortrak was placed for temporary nutrition alternative. PMH: atrial fibrillation, chronic systolic CHF, CAD s/p CABG, sick sinus syndrome s/p pacemaker, COPD, type 2 diabetes, hypertension, hyperlipidemia, chronic thrombocytopenia, recurrent UTI, OSA not on CPAP, dysphagia with PEG tube , hx of C6 fx    PT Comments    Pt asleep upon arrival, but easily aroused to participate in physical therapy session. Pt daughter politely declining practicing bed mobility and placement of hoyer lift pad, but PT verbally/visually demonstrated. Pt requiring two person maximal assist for bed mobility and transfer to chair via lift equipment Corene Cornea). Continues with generalized weakness, impaired sitting balance, and decreased activity tolerance. Will benefit from follow up PT to address.   Recommendations for follow up therapy are one component of a multi-disciplinary discharge planning process, led by the attending physician.  Recommendations may be updated based on patient status, additional functional criteria and insurance authorization.  Follow Up Recommendations  Home health PT     Assistance Recommended at Discharge Frequent or constant Supervision/Assistance  Patient can return home with the following Two people to help with walking and/or transfers;Two people to help with bathing/dressing/bathroom;Assistance with cooking/housework;Assistance with feeding;Direct supervision/assist for medications management;Assist for transportation;Direct supervision/assist for financial management;Help with stairs or ramp for entrance   Equipment Recommendations  Wheelchair (measurements PT);Other  (comment);Wheelchair cushion (measurements PT) (hoyer lift, air mattress overlay for bed)    Recommendations for Other Services       Precautions / Restrictions Precautions Precautions: Fall;Other (comment) Precaution Comments: G tube draining to gravity (ok to mobilize with therapy per MD); soft collar discontinued Restrictions Weight Bearing Restrictions: No     Mobility  Bed Mobility Overal bed mobility: Needs Assistance Bed Mobility: Supine to Sit     Supine to sit: Max assist, +2 for physical assistance     General bed mobility comments: Pt initiating bringing BLE's to edge of bed with verbal cueing, use of bed pad to scoot hips, truncal assist to elevate upright    Transfers Overall transfer level: Needs assistance Equipment used: Ambulation equipment used Transfers: Sit to/from Stand, Bed to chair/wheelchair/BSC Sit to Stand: Max assist, +2 physical assistance, From elevated surface           General transfer comment: Pt transferred bed to chair via [place] and + 2 assist. Max cues for upright posture, glute activation, and anterior weight shift.    Ambulation/Gait               General Gait Details: unable   Stairs             Wheelchair Mobility    Modified Rankin (Stroke Patients Only)       Balance Overall balance assessment: Needs assistance Sitting-balance support: Feet supported, Bilateral upper extremity supported Sitting balance-Leahy Scale: Poor Sitting balance - Comments: requiring up to mod-max assist due to posterior bias                                    Cognition Arousal/Alertness: Awake/alert Behavior During Therapy: Flat affect, Restless, Anxious Overall Cognitive Status: Impaired/Different from baseline Area of Impairment: Attention,  Memory, Following commands, Safety/judgement, Awareness, Problem solving                   Current Attention Level: Sustained Memory: Decreased recall of  precautions, Decreased short-term memory Following Commands: Follows one step commands with increased time Safety/Judgement: Decreased awareness of safety, Decreased awareness of deficits Awareness: Intellectual Problem Solving: Slow processing, Decreased initiation, Requires verbal cues, Requires tactile cues General Comments: Pt follows commands relatively consistently though yells out at times inconsistently related to pain.        Exercises General Exercises - Lower Extremity Long Arc Quad: Both, 10 reps, Seated Other Exercises Other Exercises: Seated: BUE table slides x 3 Other Exercises: Seated EOB: functional reaching with BUE's    General Comments        Pertinent Vitals/Pain Pain Assessment Pain Assessment: Faces Faces Pain Scale: Hurts little more Pain Location: generalized Pain Descriptors / Indicators: Grimacing Pain Intervention(s): Monitored during session    Home Living                          Prior Function            PT Goals (current goals can now be found in the care plan section) Acute Rehab PT Goals Patient Stated Goal: to return home Potential to Achieve Goals: Fair    Frequency    Min 2X/week      PT Plan Current plan remains appropriate    Co-evaluation              AM-PAC PT "6 Clicks" Mobility   Outcome Measure  Help needed turning from your back to your side while in a flat bed without using bedrails?: Total Help needed moving from lying on your back to sitting on the side of a flat bed without using bedrails?: Total Help needed moving to and from a bed to a chair (including a wheelchair)?: Total Help needed standing up from a chair using your arms (e.g., wheelchair or bedside chair)?: Total Help needed to walk in hospital room?: Total Help needed climbing 3-5 steps with a railing? : Total 6 Click Score: 6    End of Session Equipment Utilized During Treatment: Gait belt Activity Tolerance: Patient tolerated  treatment well Patient left: in chair;with call bell/phone within reach;with family/visitor present Nurse Communication: Mobility status;Need for lift equipment PT Visit Diagnosis: History of falling (Z91.81);Pain;Muscle weakness (generalized) (M62.81);Unsteadiness on feet (R26.81)     Time: 9604-5409 PT Time Calculation (min) (ACUTE ONLY): 22 min  Charges:  $Therapeutic Activity: 8-22 mins                     Lillia Pauls, PT, DPT Acute Rehabilitation Services Office [phone]    Norval Morton 02/17/2022, 2:39 PM

## 2022-02-17 NOTE — TOC Progression Note (Addendum)
Transition of Care [facility]) - Progression Note    Patient Details  Name: [name] MRN: [id number] Date of Birth: [birth year]  Transition of Care [facility]) CM/SW Contact  Nadene Rubins Adria Devon, RN Phone Number: 02/17/2022, 10:02 AM  Clinical Narrative:    In progression reported patient ready for discharge. Will need to get DME delivered  patient's daughter has some concerns: wants to talk directly to urology regarding when to follow up, how long he will need antibiotic , antibiotic was changed to tube yesterday how long will monitor . She wants to be sure UTI cleared before discharge.   She wants to speak to MD regarding inhaler  and would like a spacer . She thinks he may have thrush and may need diflucan. Also needs to know how often will he need labs, he had diarrhea three times yesterday when she was here and once this am.   He is on [employer] and [employer] and would like to speak to dietician   She would also like  to talk to WOC    She is interested in purwick at home. NCM spoke to [facility] [phone] , the male PureWick is not available in the home as of yet . Daughter aware   NCM will have Frances Furbish spoke to Cindie call her directly to discuss start of care date.   NCM ordered wheelchair ( asked [employer] to discuss size with daughter prior to delivery), TF, TF pump, air overlay mattress and hoyer lift. Daughter is contact person for delivery . Provided [employer] with her cell and home number.   Confirmed address. Patient will need ambulance transport at discharge.   Spoke to [place] with [facility] , they do not have Jevity 1.6 in stock. He is requesting hospital pharmacy to send patient home with 3 days worth of Jevity and supplies . Spoke to [place] with pharmacy they [employer] for tube feeding and supplies for home. Family would need to pay out of pocket and it "would be expensive". NCM spoke with Pam with [employer] they have tube feed and supplies in stock and can deliver for  discharge for tomorrow. Spoke with Rosey Bath she would like to use [employer]. PAm and Zach aware.   Daughter asking for stretcher transportation to doctor appointments post discharge from home address. Called [facility] they could provide wheel chair transport but not stretcher. [facility] 336 [phone] could provide stretcher transport but patient would need to pay $473.24 up front and then $16.00 per mile.   PT to see patient today , asked in secure chat to assist for transportation needs. However daughter wants to wait for wheelchair     Called [facility] they do not provide stretcher transport  . Called [facility] base rate is $473.24 and $16.00 per mile.  NCM called patient's insurance company to see if he has transportation benefit.   He does not have stretcher transport benefit . Discussed with daughter . She has spoken to [employer] and will accept 18 inch wheel chair . Patient cannot use [facility] to Dr Belva Crome office, because it is not a part of [place]   Dr Annabell Howells answered secure chat and stated his office could see him this Wednesday or possibly Friday . Daughter would like appointment for next week. NCM called Dr Annabell Howells office . Triage nurse will call daughter directly . Called [employer] back patient does have wheel chair transport services available through [name]. Daughter will need to call  membership services [phone] with three day notice to schedule transportation ( ref [phone]). Daughter aware and voiced understanding . Daughter now wants 18 inch wheel chair. NCM called [facility]   Expected Discharge Plan: Home w Home Health Services Barriers to Discharge: Continued Medical Work up  Expected Discharge Plan and Services Expected Discharge Plan: Home w Home Health Services   Discharge Planning Services: CM Consult Post Acute Care Choice: Home Health Living arrangements for the past 2 months: Single Family Home                 DME Arranged: Tube feeding DME  Agency: [facility] Date DME Agency Contacted: 01/27/22 Time DME Agency Contacted: [phone] Representative spoke with at DME Agency: Beola Cord await new orders HH Arranged: RN, PT HH Agency: [facility] Date [facility] Agency Contacted: 01/27/22 Time HH Agency Contacted: 1439 Representative spoke with at [facility] Agency: Kandee Keen   Social Determinants of Health (SDOH) Interventions    Readmission Risk Interventions    01/16/2022    2:12 PM  Readmission Risk Prevention Plan  Transportation Screening Complete  Medication Review Oceanographer) Referral to Pharmacy  PCP or Specialist appointment within 3-5 days of discharge Complete  HRI or Home Care Consult Complete  SW Recovery Care/Counseling Consult Complete  Palliative Care Screening Complete  Skilled Nursing Facility Not Applicable

## 2022-02-17 NOTE — Progress Notes (Signed)
Cyclic tube feeds stopped at this time. free water flush per [facility]

## 2022-02-17 NOTE — Progress Notes (Signed)
Consult received for PIV placement. Pt has poor vasculature. With multiple infiltration. At this time patient has IV PRN nausea medications ordered with possible discharge home tomorrow. If patient remains in hospital with new infusion orders. IV team recommends a PICC (also previous IV nurse recommends per 6/25 note) line to be placed d/t poor vasculature, small veins, and multiple infiltrations to bilateral forearms. Tomasita Morrow, RN VAST

## 2022-02-17 NOTE — Progress Notes (Signed)
PROGRESS NOTE    [name]  ZOX:[id number] DOB: 1933-07-14 DOA: 01/24/2022 PCP: Raliegh Ip, DO    Chief Complaint  Patient presents with   feeding tube issue     Brief Narrative:  [name] is a 88 y.o. male with medical history significant of paroxysmal atrial fibrillation, chronic systolic CHF, CAD s/p CABG, sick sinus syndrome s/p pacemaker, COPD, type 2 diabetes, hypertension, hyperlipidemia, chronic thrombocytopenia, recurrent UTI, OSA not on CPAP, dysphagia with PEG tube who presents with concerns of purulent discharge from feeding tube.  S/p upsizing of g tube on 6/3.  Surgery c/s and recommended keeping g tube to gravity to ensure no pressure of phlange on skin and balloon not pulled up against intraabdominal wall.  Per Dr. Grace Isaac, suspected pressure ulcer about medial aspect of g tube insertion - likely due to insertion angle of tube and external retention disc being too tight. General surgery is following periodically. They recommended keeping G tube to gravity to ensure no pressure on skin and balloon not pulled up against intraabdominal wall.   He underwent cortrak placement for tube feeding. For the next several days, patient was continued on core track feeding while G tube was site was monitored for healing. 6/19, patient underwent image guided conversion of G-tube to GJ feeding tube.  Per IR note, the t-tack/mesh conglomerate was sharply excised. See below for details     Assessment & Plan:  Principal Problem:   Cellulitis Active Problems:   Hypotension   Paroxysmal atrial fibrillation (HCC)   HFrEF (heart failure with reduced ejection fraction) (HCC)   C6 cervical fracture (HCC)   Controlled type 2 diabetes mellitus without complication, without long-term current use of insulin (HCC)   Delirium   S/P CABG x 3   Hypernatremia   Dysphagia   Anemia   Urinary retention   Diarrhea   Abnormal LFTs   Pressure ulcer   Goals of care,  counseling/discussion   Status post insertion of percutaneous endoscopic gastrostomy (PEG) tube (HCC)   Hematuria    Assessment and Plan: * Cellulitis Pt has dysphagia and is s/p PEG tube placement on 5/24.  Presents with malodor and purulent discharge from PEG tube site. CT abd/pelvis showed 6.4x2.2 cm area of inflammatory/infectious phlegmon in subcutaneous plaine at site of placement of gastrostomy, pockets of air in the subcutaneous plane at the site of g tube placement (due to open wound in skin or suggest infection with gas producing organisms).  No definite demonstrable thick walled loculated fluid collection. S/p fluoroscopic guided replacement and up sizing of now 24 fr ballon retention gastrostomy tube 6/3 per Dr. Grace Isaac, suspected pressure ulcer about medial aspect of g tube insertion - likely due to insertion angle of tube and external retention disc being too tight  - g tube to gravity initially.  - local wound care (zinc oxide to intact portion of epidermis, dressing changes around g tube 4x daily)  - post pyloric nutrition  - wound care  - available as needed, IR can follow up prn as well Issue at this time is g tube is to gravity and he had cortrak while we're allowing wound to heal.  It's not clear how long he'll need g tube to gravity and post pyloric feeding at this point per general surgery early on during the hospitalization.  -Status post full course Ancef. (he's tolerated cephalosporins and augmentin in past per pharmacy) -Wound cultures  (moderate staph epi, moderate candida albicans - not sure  how clinically relevant these are - will follow) -blood cultures negative. -Wound care consulted, appreciate recs - medi honey added -On 01/29/2022 in the evening it was noted per RN and daughter that wound had looked worse and crossed, came and assessed the patient and IV antibiotics broadened back to vancomycin and Merrem. -Patient reassessed by general surgery again 01/30/2022 and  stating wound from a pressure necrosis from his G-tube, recommending continued current wound care, de-escalation of antibiotics, and if wound is not shrinking over the next 1 to 2 weeks other considerations will need to be entertained at that time.  - IV antibiotics narrowed back to Ancef and patient completed a 10-day course of antibiotic treatment. -6/19 patient underwent image guided conversion of G-tube to GJ feeding tube.  Per IR note that T tack mesh conglomerate was sharply excised. -Continue current local wound care. -Per general surgery no need for any systemic antibiotics at this time, general surgery was following. -Tube feeds started dietitian following. -Cortrack discontinued. -Appreciate general surgery's input and recommendations. -Will need outpatient follow-up with general surgery  Hypotension Presented with hypotension with SBP of 90 over 50s.  This has improved with fluid but remains soft.  Previously on Lasix, Entresto, Aldactone and beta-blockers at home but these were hold during last admission.   -Continue to hold hypertensive medications secondary to low blood pressure. -Saline lock IV fluids.  -repeat echo -> EF 40-45% - BP overall improved, follow  C6 cervical fracture (HCC) This is from a ground-level fall back in April.  Was on a soft cervical collar and continue follow-up with neurosurgery. Discussed with Dr. Danielle Dess, he notes ok to d/c c collar Flex ex films  with known subacute fx through anterior inferior aspect of c 6 vertebral body, anterior osteophyte not well seen, C6-7 space normally aligned without abnormal motion on extension, not well assessed on flexion (see report) -Patient seen by neurosurgery who states C6 fracture is healed and patient can be removed from collar and pursue activities as tolerated.  HFrEF (heart failure with reduced ejection fraction) (HCC) Appears euvolemic on exam. Echo as above, -Prior to admission patient noted to be on Lasix,  Entresto, Aldactone and beta-blockers which are currently on hold due to soft blood pressure.  Paroxysmal atrial fibrillation (HCC) -Rate controlled on amiodarone.   -Eliquis held due to ongoing hematuria. -Urology recommending Eliquis may be resumed once urine has remained clear for greater than 2 days.   Delirium Delirium precautions -Improved.  Controlled type 2 diabetes mellitus without complication, without long-term current use of insulin (HCC) Controlled.  Last A1c 5.2% on 5/15. -CBG 158 this morning.   -Follow.  Hypernatremia - Likely secondary to dehydration.   -Improved with hydration.   -Patient placed on gentle hydration 01/31/2022 due to malposition of cortrack tube. -Cortrack placed back and was in position as such IV fluids discontinued.   -Patient now with a GJ tube and cortrack discontinued and patient on tube feeds.   -Tube feeds changed to cyclical feeds which patient is tolerating.   -Sodium at 131 this morning. -Follow.  S/P CABG x 3 Stable and asymptomatic from cardiac standpoint.  Anemia  Anemia labs c/w aocd, iron def -Patient with hematuria which has been ongoing. -Status post transfusion 1 unit packed red blood cells 02/09/2022. -Hemoglobin stable at 7.6. -Transfusion threshold hemoglobin < 7.    Dysphagia S/p PEG tube by VIR on 5/24, s/p replacement as above -Dietitian consulted for tube feeding  -The afternoon of 01/31/2022, concerned that  tube feeds coming out of PEG tube site and concern for malposition of cortrack. -Abdominal films obtained and feeding tube tip noted at the position of the pylorus directed towards the duodenum. -Advanced cortrack 4 cm, 02/01/2022 with repeat abdominal films showing appropriate placement of cortrack tube.  -Resumed tube feeds. -IV fluids have been discontinued. -6/19 patient underwent image guided conversion of G-tube to GJ feeding tube. -Tube feeds resumed with dietitian following. -Cortrack  discontinued -Dietitian following and appreciate their input and recommendations.  Urinary retention Foley catheter placed on 5/27 due to urinary retention at recent admission.  He has planned outpatient follow-up with urology. -Patient underwent successful voiding trial.  -Urine output 3500 cc over the past 24 hours. -Patient with good urine output post Foley catheter removal with intermittent hematuria noted.  -Continue doxazosin. -Monitor urine output. -Urology following.  Diarrhea Daughter reported persistent diarrhea since the start of tube feed on 5/25.  -Tube feeds changed with some improvement with diarrhea. -Patient with some diarrhea yesterday. Negative GI pathogen panel Negative c diff  Abnormal LFTs Mildly elevated -LFTs trending down. -Follow.  Pressure ulcer Pressure Injury 01/24/22 Buttocks Deep Tissue Pressure Injury - Purple or maroon localized area of discolored intact skin or blood-filled blister due to damage of underlying soft tissue from pressure and/or shear. (Active)  01/24/22 2036  Location: Buttocks  Location Orientation:   Staging: Deep Tissue Pressure Injury - Purple or maroon localized area of discolored intact skin or blood-filled blister due to damage of underlying soft tissue from pressure and/or shear.  Wound Description (Comments):   Present on Admission: Yes   Wound c/s   Goals of care, counseling/discussion Not currently interested in palliative care discussions, noted we can review pending course.  Hematuria - Patient with hematuria noted since 02/04/2022. -Patient with history of prostate cancer status postradiation under the care of Dr. Annabell Howells. -Patient denies any abdominal pain, no dysuria, no nausea, no vomiting, no CVA tenderness. -Per daughter patient with prior history of nephrolithiasis. -Renal ultrasound with no hydronephrosis or shadowing stone, bilateral renal cyst, minimal bladder debris.   -Urine cultures positive for  VRE -Foley catheter initially placed with irrigation however with no significant improvement with hematuria and as such Foley catheter discontinued per urology recommendations. -Patient seen in consultation by urology, Dr. Annabell Howells on 02/10/2022 who recommended removal of Foley catheter, treatment of UTI and monitoring hematuria off Eliquis for 1 to 2 days and if no significant improvement will likely need cystoscopy and possible fulguration.   -Patient now with intermittent hematuria.   -Continue treatment of VRE with Zyvox which has been transitioned to Zyvox per tube 02/16/2022. -Supportive care.         DVT prophylaxis: SCDs Code Status: Full. Family Communication: Updated patient.  Updated daughter and wife at bedside.   Disposition: SNF  Status is: Inpatient Remains inpatient appropriate because: Severity of illness   Consultants:  Neurosurgery: Dr. Danielle Dess 01/28/2022 General surgery: Dr. Sheliah Hatch 01/26/2022 Wound care RN Urology: Dr. Annabell Howells 02/10/2022   Procedures:  CT abdomen and pelvis 01/24/2022 Plain films of the cervical spine 01/27/2022 Fluoroscopic guided replacement of gastrostomy tube with upsizing 01/25/2022 interventional radiology 2D echo 01/27/2022 Renal ultrasound 02/04/2022 MH guided conversion of G-tube to GJ feeding tube, 24 F GJ placed by IR, Dr. Loreta Ave 02/10/2022    Antimicrobials:  Diflucan 01/25/2022>>>> 02/02/2022 IV Ancef 01/27/2022>>>>> 02/02/2022 IV Rocephin 01/27/2022 x 1 dose IV Merrem 01/24/2022>>>> 01/27/2022 IV vancomycin 01/24/2022>>>>> 01/27/2022 IV Zyvox 02/11/2022>> Zyvox per tube 02/16/2022>>>>>  Subjective: Laying in bed.  Daughter at bedside.  No chest pain.  No shortness of breath.  No abdominal pain.  Less emotional after resuming home regimen Effexor.  Urine clear, per daughter had some hematuria yesterday.  Objective: Vitals:   02/16/22 2035 02/17/22 0504 02/17/22 0800 02/17/22 1305  BP: (!) 113/55 139/83 (!) 94/48 (!) 118/58  Pulse: 78 81 74 72   Resp: 18 20 18    Temp: 98 F (36.7 C) 98.2 F (36.8 C) 97.7 F (36.5 C)   TempSrc: Oral Oral Oral   SpO2: 98% 99% 99% 95%  Weight:      Height:        Intake/Output Summary (Last 24 hours) at 02/17/2022 1533 Last data filed at 02/17/2022 0402 Gross per 24 hour  Intake 300 ml  Output 1200 ml  Net -900 ml   Filed Weights   02/13/22 1100 02/14/22 0500 02/15/22 0407  Weight: 79.4 kg 83 kg 84.8 kg    Examination:  General exam: Alert. Respiratory system: CTA B anterior lung fields.  No wheezes, no crackles, no rhonchi.  Fair air movement.  Speaking in full sentences.  Cardiovascular system: Regular rate rhythm no murmurs rubs or gallops.  No JVD.  No lower extremity edema.  Gastrointestinal system: Abdomen is soft, nontender, nondistended, positive bowel sounds.  GJ tube intact.  Dressing intact in the insertion site. Central nervous system: Alert and oriented. No focal neurological deficits. Extremities: Symmetric 5 x 5 power. Skin: No rashes, lesions or ulcers Psychiatry: Judgement and insight appear normal. Mood & affect appropriate.     Data Reviewed:   CBC: Recent Labs  Lab 02/11/22 0444 02/12/22 0417 02/13/22 0101 02/14/22 0858 02/15/22 0102 02/16/22 0151 02/17/22 0056  WBC 4.3 3.9* 3.9* 3.6* 3.4* 4.1 3.5*  NEUTROABS 2.5 2.1  --   --   --   --   --   HGB 7.9* 8.0* 8.2* 8.5* 7.3* 7.5* 7.6*  HCT 25.4* 25.0* 25.2* 27.0* 24.3* 24.1* 24.3*  MCV 100.8* 99.2 98.8 101.5* 101.3* 102.1* 100.8*  PLT 123* 114* 109* 101* 97* 91* 86*    Basic Metabolic Panel: Recent Labs  Lab 02/13/22 0101 02/14/22 0858 02/15/22 0102 02/16/22 0151 02/17/22 0056  NA 128* 136 137 136 131*  K 4.1 4.3 4.1 4.3 4.4  CL 98 108 109 107 103  CO2 23 24 22 23 23   GLUCOSE 187* 150* 160* 136* 152*  BUN 35* 27* 31* 34* 36*  CREATININE 0.77 0.66 0.56* 0.63 0.61  CALCIUM 8.6* 8.7* 8.5* 8.6* 8.6*    GFR: Estimated Creatinine Clearance: 70.6 mL/min (by C-G formula based on SCr of 0.61  mg/dL).  Liver Function Tests: No results for input(s): "AST", "ALT", "ALKPHOS", "BILITOT", "PROT", "ALBUMIN" in the last 168 hours.   CBG: Recent Labs  Lab 02/15/22 2333 02/16/22 1740 02/17/22 0008 02/17/22 0552 02/17/22 1204  GLUCAP 154* 98 144* 158* 93     No results found for this or any previous visit (from the past 240 hour(s)).         Radiology Studies: No results found.      Scheduled Meds:  acetaminophen  1,000 mg Per Tube Q8H   amiodarone  200 mg Per Tube Daily   Chlorhexidine Gluconate Cloth  6 each Topical Q0600   doxazosin  1 mg Per Tube Daily   famotidine  20 mg Per Tube BID   feeding supplement (JEVITY 1.5 CAL/FIBER)  1,000 mL Per Tube Q24H   feeding supplement (PROSource TF)  45 mL Per Tube Daily   fluticasone furoate-vilanterol  1 puff Inhalation Daily   free water  120 mL Per Tube Q4H   linezolid  600 mg Per Tube Q12H   nutrition supplement (JUVEN)  1 packet Per Tube BID BM   venlafaxine  18.75 mg Per Tube BID WC   Zinc Oxide   Topical BID   Continuous Infusions:       LOS: 24 days    Time spent: 35 minutes    Ramiro Harvest, MD [facility]   To contact the attending provider between 7A-7P or the covering provider during after hours 7P-7A, please log into the web site [email or web address] and access using universal [place] password for that web site. If you do not have the password, please call the hospital operator.  02/17/2022, 3:33 PM

## 2022-02-18 ENCOUNTER — Other Ambulatory Visit (HOSPITAL_COMMUNITY): Payer: Self-pay

## 2022-02-18 DIAGNOSIS — R7989 Other specified abnormal findings of blood chemistry: Secondary | ICD-10-CM | POA: Diagnosis not present

## 2022-02-18 DIAGNOSIS — D649 Anemia, unspecified: Secondary | ICD-10-CM | POA: Diagnosis not present

## 2022-02-18 DIAGNOSIS — L03311 Cellulitis of abdominal wall: Secondary | ICD-10-CM | POA: Diagnosis not present

## 2022-02-18 DIAGNOSIS — L03818 Cellulitis of other sites: Secondary | ICD-10-CM | POA: Diagnosis not present

## 2022-02-18 LAB — GLUCOSE, CAPILLARY
Glucose-Capillary: 134 mg/dL — ABNORMAL HIGH (ref 70–99)
Glucose-Capillary: 100 mg/dL — ABNORMAL HIGH (ref 70–99)
Glucose-Capillary: 102 mg/dL — ABNORMAL HIGH (ref 70–99)
Glucose-Capillary: 109 mg/dL — ABNORMAL HIGH (ref 70–99)

## 2022-02-18 MED ORDER — APIXABAN 5 MG PO TABS
5.0000 mg | ORAL_TABLET | Freq: Two times a day (BID) | ORAL | Status: DC
  Administered 2022-02-18 – 2022-02-19 (×3): 5 mg
  Filled 2022-02-18 (×3): qty 1

## 2022-02-18 MED ORDER — VENLAFAXINE HCL 37.5 MG PO TABS
18.2500 mg | ORAL_TABLET | Freq: Every day | ORAL | Status: DC
Start: 2022-02-19 — End: 2022-02-19
  Filled 2022-02-18: qty 0.5

## 2022-02-18 NOTE — Progress Notes (Signed)
Mobility Specialist Progress Note:   02/18/22 1740  Mobility  Activity Transferred from chair to bed  Level of Assistance +2 (takes two people)  Assistive Device MaxiMove  Activity Response Tolerated well  $Mobility charge 1 Mobility   NT requesting assistance to transfer pt back to bed. Pt has slid down in chair, requiring adjustment of the lift pad. Pt tolerated transfer well. Left with all needs met, NT in room.   Addison Lank Acute Rehab Secure Chat or Office Phone: [phone]

## 2022-02-18 NOTE — Progress Notes (Signed)
Mobility Specialist Progress Note:   02/18/22 1600  Mobility  Activity Transferred from bed to chair  Level of Assistance +2 (takes two people)  Assistive Device MaxiMove  Activity Response Tolerated well  $Mobility charge 1 Mobility   Daughter requesting to transfer pt to chair. Required +2 assist with maximove. Pt left with all needs met, will f/u to get back to bed.   Addison Lank Acute Rehab Secure Chat or Office Phone: [phone]

## 2022-02-18 NOTE — TOC Benefit Eligibility Note (Signed)
Patient Product/process development scientist completed.    The patient is currently admitted and upon discharge could be taking linezolid (Zyvox) 100 mg/5 ml.  The current 7 day co-pay is, $619.11.   The patient is currently admitted and upon discharge could be taking linezolid (Zyvox) 600 mg tablets.  The current 7 day co-pay is, $69.02.   The patient is insured through [employer]     Roland Earl, CPhT Pharmacy Patient Advocate Specialist [facility] Patient Advocate Team Direct Number: [phone]  Fax: [phone]
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
## 2022-02-18 NOTE — TOC Progression Note (Addendum)
Transition of Care [facility]) - Progression Note    Patient Details  Name: [name] MRN: [id number] Date of Birth: 1932/10/26  [facility] CM/SW Contact  Nadene Rubins Adria Devon, RN Phone Number: 02/18/2022, 11:25 AM  Clinical Narrative:     Sherron Monday to Elita Quick with [employer]. Tube feeding is ordered and will arrive at patient's home later this afternoon. Pam will be in patient's room in a hour to provide teaching.   Daughter does not want patient discharged until she confirms with her mother that tube feeding and supplies have arrived at home.   She does want ambulance transport home.   NCM spoke to daughter . Daughter aware of above. Daughter wants to speak to [facility]. NCM sent secure chat to [facility]CS. Also daughter concerned that Vasili was nauseated yesterday . Daughter wants to speak to dietician and attending. Secure messages sent to both.   Air mattress overlay, hoyer lift and wheel chair were delivered to home  Daughter has ordered Juven and prosource but will not receive until Thursday . [facility] does not supply Juven and prosource . NCM secure chatted [facility] and dietician to see if hospital can supply Juven and prosource   Ethan in [facility] replied : do not fill feeding supplements like that in TOC.  [name] dietician has  prosource samples and will check to see if she has any Juven . [facility] does not have Juven samples . Nurse asking pharmacy   Expected Discharge Plan: Home w Home Health Services Barriers to Discharge: Continued Medical Work up  Expected Discharge Plan and Services Expected Discharge Plan: Home w Home Health Services   Discharge Planning Services: CM Consult Post Acute Care Choice: Home Health Living arrangements for the past 2 months: Single Family Home                 DME Arranged: Tube feeding DME Agency: [employer] Date DME Agency Contacted: 01/27/22 Time DME Agency Contacted: 1438 Representative spoke with at DME Agency: Beola Cord await new  orders HH Arranged: RN, PT HH Agency: [facility] Date [facility] Agency Contacted: 01/27/22 Time HH Agency Contacted: 1439 Representative spoke with at [facility] Agency: Kandee Keen   Social Determinants of Health (SDOH) Interventions    Readmission Risk Interventions    01/16/2022    2:12 PM  Readmission Risk Prevention Plan  Transportation Screening Complete  Medication Review Oceanographer) Referral to Pharmacy  PCP or Specialist appointment within 3-5 days of discharge Complete  HRI or Home Care Consult Complete  SW Recovery Care/Counseling Consult Complete  Palliative Care Screening Complete  Skilled Nursing Facility Not Applicable

## 2022-02-19 DIAGNOSIS — L03818 Cellulitis of other sites: Secondary | ICD-10-CM | POA: Diagnosis not present

## 2022-02-19 LAB — CBC
HCT: 25.7 % — ABNORMAL LOW (ref 39.0–52.0)
Hemoglobin: 7.8 g/dL — ABNORMAL LOW (ref 13.0–17.0)
MCH: 31.3 pg (ref 26.0–34.0)
MCHC: 30.4 g/dL (ref 30.0–36.0)
MCV: 103.2 fL — ABNORMAL HIGH (ref 80.0–100.0)
Platelets: 76 10*3/uL — ABNORMAL LOW (ref 150–400)
RBC: 2.49 MIL/uL — ABNORMAL LOW (ref 4.22–5.81)
RDW: 19.7 % — ABNORMAL HIGH (ref 11.5–15.5)
WBC: 3.6 10*3/uL — ABNORMAL LOW (ref 4.0–10.5)
nRBC: 0 % (ref 0.0–0.2)

## 2022-02-19 LAB — BASIC METABOLIC PANEL
Anion gap: 8 (ref 5–15)
BUN: 44 mg/dL — ABNORMAL HIGH (ref 8–23)
CO2: 25 mmol/L (ref 22–32)
Calcium: 9 mg/dL (ref 8.9–10.3)
Chloride: 105 mmol/L (ref 98–111)
Creatinine, Ser: 0.68 mg/dL (ref 0.61–1.24)
GFR, Estimated: >60 mL/min (ref >60)
Glucose, Bld: 127 mg/dL — ABNORMAL HIGH (ref 70–99)
Potassium: 4.4 mmol/L (ref 3.5–5.1)
Sodium: 138 mmol/L (ref 135–145)

## 2022-02-19 LAB — GLUCOSE, CAPILLARY
Glucose-Capillary: 114 mg/dL — ABNORMAL HIGH (ref 70–99)
Glucose-Capillary: 133 mg/dL — ABNORMAL HIGH (ref 70–99)

## 2022-02-19 MED ORDER — ZINC OXIDE 12.8 % EX OINT: TOPICAL_OINTMENT | Freq: Two times a day (BID) | CUTANEOUS | 0 refills | Status: DC

## 2022-02-19 MED ORDER — DOXAZOSIN MESYLATE 1 MG PO TABS: 1.0000 mg | ORAL_TABLET | Freq: Every day | ORAL | 0 refills | Status: DC

## 2022-02-19 MED ORDER — DIPHENOXYLATE-ATROPINE 2.5-0.025 MG/5ML PO LIQD: 5.0000 mL | Freq: Four times a day (QID) | ORAL | 0 refills | Status: DC | PRN

## 2022-02-19 MED ORDER — FAMOTIDINE 20 MG PO TABS: 20.0000 mg | ORAL_TABLET | Freq: Two times a day (BID) | ORAL | 1 refills | Status: DC

## 2022-02-19 MED ORDER — JUVEN PO PACK: 1.0000 | PACK | Freq: Two times a day (BID) | ORAL | 0 refills | Status: DC

## 2022-02-19 MED ORDER — FREE WATER: 100.0000 mL | 0 refills | Status: DC

## 2022-02-19 MED ORDER — PROSOURCE TF PO LIQD: 45.0000 mL | Freq: Every day | ORAL | 0 refills | Status: DC

## 2022-02-19 MED ORDER — FLUCONAZOLE 100 MG PO TABS: ORAL_TABLET | ORAL | 0 refills | Status: AC

## 2022-02-19 MED ORDER — JEVITY 1.5 CAL/FIBER PO LIQD: 1000.0000 mL | ORAL | 0 refills | Status: DC

## 2022-02-19 MED ORDER — SPACER/AERO-HOLD CHAMBER BAGS MISC: 1.0000 | 0 refills | Status: DC | PRN

## 2022-02-19 MED ORDER — JEVITY 1.5 CAL/FIBER PO LIQD
1000.0000 mL | ORAL | Status: DC
  Filled 2022-02-19: qty 1000

## 2022-02-19 NOTE — Plan of Care (Signed)

## 2022-02-19 NOTE — Progress Notes (Signed)
Nutrition Follow-up  DOCUMENTATION CODES:  Severe malnutrition in context of chronic illness  INTERVENTION:  Adjust TF back to continuous as follows: Jevity 1.5 at 8m/h Free water flush 127mq4h Prosource TF 1x/d This provides 2000kcal, 95g of protein, 172353mree water (TF+flush) Continue Juven BID, each packet provides 95 calories, 2.5 grams of protein (collagen), and 9.8 grams of carbohydrate (3 grams sugar) + micronutrients for wound healing At discharge, if TF were to be adjusted to nocturnal to provide a break from being connected to pump, recommend the following Jevity 1.5 at 64m6mx 15 hours (1350mL1mFree water flush 120mL 33mProsource TF 1x/d This provides 2065kcal, 97g of protein, 1746mL f35mwater (TF+flush)  NUTRITION DIAGNOSIS:  Severe Malnutrition (in the context of chronic illness) related to dysphagia as evidenced by severe fat depletion, severe muscle depletion. - remains applicable  GOAL:  Patient will meet greater than or equal to 90% of their needs - being addressed with TF  MONITOR:  Diet advancement, Labs, Weight trends, TF tolerance, Skin, I & O's  REASON FOR ASSESSMENT:  Consult Assessment of nutrition requirement/status  ASSESSMENT:  88 y.o.66ale with medical history significant of paroxysmal atrial fibrillation, chronic systolic CHF, CAD s/p CABG x 3, sick sinus syndrome s/p pacemaker, COPD, type 2 diabetes, hypertension, hyperlipidemia, chronic thrombocytopenia, incisional hernia s/p repair 2004, inguinal hernia repair, recurrent UTI, OSA not on CPAP, prostate ca, COVID 19 (6/22), AAA, GERD, CVA and dysphagia s/p IR G tube 5/24 who presents with concerns of purulent discharge from feeding tube and diarrhea.  6/3 - IR replacement of 24 Fr balloon retention gastrostomy tube  6/5 - cortrak tube placement (post-pyloric) 6/19 - G-tube conversion to G-J tube 6/20 - cortrak removed  Pt resting in bed at the time of assessment. Daughter at bedside.  Reports that diarrhea has increased since switching to nocturnal feeds. Inquired about adding fiber, assured her that formula had fiber (28g/d) in his current rate. MD requested this afternoon that feeds be adjusted back to continuous. Discussed with RN.   Home health set up and all supplies were delivered yesterday. Will likely be dc tomorrow.  Nutritionally Relevant Medications: Scheduled Meds:  doxazosin  1 mg Per Tube Daily   famotidine  20 mg Per Tube BID   PROSource TF  45 mL Per Tube Daily   free water  120 mL Per Tube Q4H   JUVEN  1 packet Per Tube BID BM   Continuous Infusions:  feeding supplement (JEVITY 1.5 CAL/FIBER)     PRN Meds: alum & mag hydroxide-simeth, ondansetron  Labs Reviewed: BUN 44 CBG ranges from 100-133 mg/dL over the last 24 hours  NUTRITION - FOCUSED PHYSICAL EXAM: Flowsheet Row Most Recent Value  Orbital Region Severe depletion  Upper Arm Region Severe depletion  Thoracic and Lumbar Region Severe depletion  Buccal Region Severe depletion  Temple Region Moderate depletion  Clavicle Bone Region Severe depletion  Clavicle and Acromion Bone Region Severe depletion  Scapular Bone Region Severe depletion  Dorsal Hand Severe depletion  Patellar Region Severe depletion  Anterior Thigh Region Severe depletion  Posterior Calf Region Severe depletion  Edema (RD Assessment) None  Hair Reviewed  Eyes Reviewed  Mouth Reviewed  Skin Reviewed  Nails Reviewed   Diet Order:   Diet Order             Diet NPO time specified           DIET DYS 2  Diet NPO time specified  Diet effective now                  EDUCATION NEEDS:  Education needs have been addressed (with pt's daughter)  Skin:  Skin Assessment: Reviewed RN Assessment (ecchymosis, DTI buttocks)  Last BM:  6/27 - type 7  Height:  Ht Readings from Last 1 Encounters:  01/25/22 '5\' 11"'$  (1.803 m)   Weight:  Wt Readings from Last 1 Encounters:  02/19/22 80.3 kg   Ideal Body  Weight:  78 kg  BMI:  Body mass index is 24.69 kg/m.  Estimated Nutritional Needs:  Kcal:  1900-2200kcal/day Protein:  95-110g/day Fluid:  1.9-2.2L/day   Ranell Patrick, RD, LDN Clinical Dietitian RD pager # available in [facility]  After hours/weekend pager # available in [facility]

## 2022-02-19 NOTE — TOC Transition Note (Signed)
Transition of Care [facility]) - CM/SW Discharge Note   Patient Details  Name: [name] MRN: [id number] Date of Birth: 05/17/1933  Transition of Care [facility]) CM/SW Contact:  Carles Collet, RN Phone Number: 02/19/2022, 2:06 PM   Clinical Narrative:    Patient to DC to home today. Alvis Lemmings and [employer] aware. Daughter has confirmed all DME and feeds are at home. Provided with Juven samples. MD and RD have instructed her of rate for feeds. Spoke w daughter at bedside and she is in agreement with DC and ready for [facility] to be called. PTAR forms on chart, and [facility] called, nurse updated to get patient ready for pickup. No other TOC needs for DC     Final next level of care: [place] Barriers to Discharge: Continued Medical Work up   Patient Goals and CMS Choice Patient states their goals for this hospitalization and ongoing recovery are:: to return to home CMS Medicare.gov Compare Post Acute Care list provided to:: Patient Represenative (must comment) (daughter [name]) Choice offered to / list presented to : Adult Children  Discharge Placement                       Discharge Plan and Services   Discharge Planning Services: CM Consult Post Acute Care Choice: Home Health          DME Arranged: Tube feeding DME Agency: [employer] Date DME Agency Contacted: 01/27/22 Time DME Agency Contacted: [id number] Representative spoke with at DME Agency: Letta Kocher await new orders HH Arranged: RN, PT [place] Agency: [place] Date [place]: 01/27/22 Time [place]: [place] Representative spoke with at [place]: [place] ([place]) Interventions     Readmission Risk Interventions    01/16/2022    2:12 PM  Readmission Risk Prevention Plan  Transportation Screening Complete  Medication Review Press photographer) Referral to Pharmacy  PCP or Specialist appointment within 3-5 days of discharge Complete  HRI or [place] Complete  SW Recovery Care/Counseling Consult Complete  Palliative Care Screening Complete  [place] Not Applicable

## 2022-02-20 ENCOUNTER — Telehealth: Payer: Self-pay | Admitting: Family Medicine

## 2022-02-20 NOTE — Discharge Summary (Signed)
Physician Discharge Summary   Patient: [name] MRN: [id number] DOB: 08/06/1933  Admit date:     01/24/2022  Discharge date: 02/19/2022  Discharge Physician: Berle Mull  PCP: [name], DO  Recommendations at discharge: Follow-up with PCP in 1 week.  Recheck CBC and BMP Follow-up with dietitian and once the patient is able to tolerate p.o. adequately mucous nutritional requirement consider PCP follow-up for referral to radiology for [place] tube removal. Currently no indication to follow-up with general surgery or IR.  Numbers provided for emergency.  Discharge Diagnoses: Principal Problem:   Cellulitis Active Problems:   Hypotension   Paroxysmal atrial fibrillation (HCC)   HFrEF (heart failure with reduced ejection fraction) (HCC)   C6 cervical fracture (HCC)   Controlled type 2 diabetes mellitus without complication, without long-term current use of insulin (HCC)   Delirium   S/P CABG x 3   Hypernatremia   Dysphagia   Anemia   Urinary retention   Diarrhea   Abnormal LFTs   Pressure ulcer   Goals of care, counseling/discussion   Protein-calorie malnutrition, severe   Status post insertion of percutaneous endoscopic gastrostomy (PEG) tube ([place])   Hematuria  Hospital Course: [name] is a 88 y.o. male with medical history significant of paroxysmal atrial fibrillation, chronic systolic CHF, CAD s/p CABG, sick sinus syndrome s/p pacemaker, COPD, type 2 diabetes, hypertension, hyperlipidemia, chronic thrombocytopenia, recurrent UTI, OSA not on CPAP, dysphagia with PEG tube who presents with concerns of purulent discharge from feeding tube.  S/p upsizing of g tube on 6/3.  Surgery c/s and recommended keeping g tube to gravity to ensure no pressure of phlange on skin and balloon not pulled up against intraabdominal wall.  Per Dr. Pascal Lux, suspected pressure ulcer about medial aspect of g tube insertion - likely due to insertion angle of tube and external retention disc  being too tight. General surgery is following periodically. They recommended keeping G tube to gravity to ensure no pressure on skin and balloon not pulled up against intraabdominal wall.   He underwent cortrak placement for tube feeding. For the next several days, patient was continued on core track feeding while G tube was site was monitored for healing. 6/19, patient underwent image guided conversion of G-tube to [place] feeding tube.  Per IR note, the t-tack/mesh conglomerate was sharply excised. See below for details   Assessment and Plan: Cellulitis of the abdominal wall at PEG tube insertion site Pt has dysphagia and is s/p PEG tube placement on 5/24. Presents with malodor and purulent discharge from PEG tube site. CT abd/pelvis showed 6.4x2.2 cm area of inflammatory/infectious phlegmon in subcutaneous plaine at site of placement of gastrostomy, pockets of air in the subcutaneous plane at the site of g tube placement. No definite demonstrable thick walled loculated fluid collection. S/p fluoroscopic guided replacement and up sizing of now 24 fr ballon retention gastrostomy tube 6/3 per Dr. Pascal Lux, suspected pressure ulcer about medial aspect of g tube insertion - likely due to insertion angle of tube and external retention disc being too tight - g tube to gravity initially. - local wound care (zinc oxide to intact portion of epidermis, dressing changes around g tube 4x daily) - post pyloric nutrition - wound care -Status post full course Ancef. (he's tolerated cephalosporins and augmentin in past per pharmacy) -Wound cultures grew multiple species. -blood cultures negative. -Wound care consulted, appreciate recs -6/19 patient underwent image guided conversion of G-tube to [place] feeding tube.  Per IR  note that T tack mesh conglomerate was sharply excised. -Continue current local wound care. -Per general surgery no need for any systemic antibiotics at this time. -Tube feeds started dietitian  following. -Cortrack discontinued.  On the day of discharge discussed with general surgery as well as IR.  Patient currently does not require any follow-up with both services. Per general surgery patient can advance diet by mouth as tolerated and as recommended by speech therapy as the wound starts appearing to heal. Would recommend to wait for another 2 weeks before initiating p.o. feedings. If the patient does not require a GJ tube then patient can follow-up with IR for removal.  Hematuria. Concern for hemorrhagic cystitis. - Patient with hematuria noted since 02/04/2022. -Patient with history of prostate cancer status postradiation under the care of Dr. Jeffie Pollock. -Patient denies any abdominal pain, no dysuria, no nausea, no vomiting, no CVA tenderness. -Per daughter patient with prior history of nephrolithiasis. -Renal ultrasound with no hydronephrosis or shadowing stone, bilateral renal cyst, minimal bladder debris.   -Urine cultures positive for VRE -Foley catheter initially placed with irrigation however with no significant improvement with hematuria and as such Foley catheter discontinued per urology recommendations. -Patient seen in consultation by urology, Dr. Jeffie Pollock on 02/10/2022 who recommended removal of Foley catheter, treatment of UTI and monitoring hematuria off Eliquis for 1 to 2 days and if no significant improvement will likely need cystoscopy and possible fulguration.   -Hematuria resolved after holding Eliquis and remain under control despite resumption of the medication. -Continue treatment of VRE with Zyvox which has been transitioned to Zyvox per tube 02/16/2022.  Completed 7-day treatment course in the hospital.  Discussed with urology they are okay with holding Zyvox Urine is clear of blood And patient is clinically improving.  Concern with ongoing levels therapy was thrombocytopenia.   Hypotension Most likely medication induced. repeat echo -> EF 40-45% BP overall improved  just by holding antihypertensive medication.  Monitor. HFrEF (heart failure with reduced ejection fraction) ([place]) Appears euvolemic on exam. Echo as above, -Prior to admission patient noted to be on Lasix, Entresto, Aldactone and beta-blockers which are currently on hold due to soft blood pressure. Patient will follow-up with cardiology.  Paroxysmal atrial fibrillation (HCC) -Rate controlled on amiodarone.   -Eliquis held due to ongoing hematuria. -No hematuria on resumption of Eliquis. Follow-up with PCP with CBC. CAD S/P CABG x 3 Stable and asymptomatic from cardiac standpoint.  C6 cervical fracture ([place]) This is from a ground-level fall back in April.  Was on a soft cervical collar and continue follow-up with neurosurgery. Discussed with Dr. Ellene Route, he notes ok to d/c c collar Flex ex films  with known subacute fx through anterior inferior aspect of c 6 vertebral body, anterior osteophyte not well seen, C6-7 space normally aligned without abnormal motion on extension, not well assessed on flexion (see report) -Patient seen by neurosurgery who states C6 fracture is healed and patient can be removed from collar and pursue activities as tolerated.   Anemia and chronic, thrombocytopenia  Anemia labs c/w aocd, iron def -Patient with hematuria which has improved and seems to be resolving.  -Status post transfusion 1 unit packed red blood cells 02/09/2022. -Hemoglobin stable.  Recommend repeating CBC.  Has seen oncology in the past with negative work-up including bone marrow biopsy. Zyvox course was limited to 7 days due to thrombocytopenia concerns.   Delirium Delirium precautions -Improved.   Controlled type 2 diabetes mellitus without complication, without long-term current use of  insulin ([place]) Controlled.  Last A1c 5.2% on 5/15.  Continue metformin on discharge   Hypernatremia - Likely secondary to dehydration.   -Improved with hydration.    Dysphagia S/p PEG tube by VIR on  5/24, s/p replacement as above -Dietitian consulted for tube feeding  -The afternoon of 01/31/2022, concerned that tube feeds coming out of PEG tube site and concern for malposition of cortrack. -Abdominal films obtained and feeding tube tip noted at the position of the pylorus directed towards the duodenum. -Advanced cortrack 4 cm, 02/01/2022 with repeat abdominal films showing appropriate placement of cortrack tube.  -Resumed tube feeds. -IV fluids have been discontinued. -6/19 patient underwent image guided conversion of G-tube to [place] feeding tube. -Tube feeds resumed with dietitian following. -Cortrack discontinued -Dietitian following and appreciate their input and recommendations. Per speech therapy patient can tolerate dysphagia 2 diet.  Urinary retention Foley catheter placed on 5/27 due to urinary retention at recent admission.  He has planned outpatient follow-up with urology. -Patient underwent successful voiding trial.  -Urine output 3500 cc over the past 24 hours. -Patient with good urine output post Foley catheter removal with intermittent hematuria noted.  -Continue doxazosin. -Monitor urine output. -Urology following.   Diarrhea Daughter reported persistent diarrhea since the start of tube feed on 5/25.  -Tube feeds changed with some improvement with diarrhea. Negative GI pathogen panel Continue Lomotil.  Switch from tablet to liquids. Negative c diff   Abnormal LFTs Mildly elevated -LFTs trending down. -Follow.    Goals of care, counseling/discussion Not currently interested in palliative care discussions at this time.   Protein-calorie malnutrition, severe - Patient with history of dysphagia and poor oral intake and as evidenced by severe fat depletion, severe muscle depletion, underwent PEG tube placement 5/24 with initiation of tube feeds 5/25. -Continue current tube feeds. -Dietitian following.  Pressure ulcer bilateral buttocks present on admission. Wound  care was consulted.  Dressing changes on discharge. Pressure Injury 01/24/22 Buttocks Deep Tissue Pressure Injury - Purple or maroon localized area of discolored intact skin or blood-filled blister due to damage of underlying soft tissue from pressure and/or shear. (Active)  01/24/22 2036  Location: Buttocks  Location Orientation:   Staging: Deep Tissue Pressure Injury - Purple or maroon localized area of discolored intact skin or blood-filled blister due to damage of underlying soft tissue from pressure and/or shear.  Wound Description (Comments):   Present on Admission: Yes  Dressing Type Foam - Lift dressing to assess site every shift 02/19/22 0800      Consultants: Neurology, neurosurgery, general surgery, IR, Procedures performed:  IR guided G-tube exchange, IR guided GJ tube placement  DISCHARGE MEDICATION: Allergies as of 02/19/2022       Reactions   Penicillins Other (See Comments)   Unknown reaction -- Tolerated Augmentin courses 2019, 2023; Tolerates cephalosporins    Tramadol Other (See Comments)   Dizzy   Ketorolac Tromethamine Rash        Medication List     STOP taking these medications    diphenoxylate-atropine 2.5-0.025 MG tablet Commonly known as: Lomotil Replaced by: diphenoxylate-atropine 2.5-0.025 MG/5ML liquid   docusate sodium 100 MG capsule Commonly known as: COLACE   feeding supplement Liqd Replaced by: nutrition supplement (JUVEN) Pack You also have another medication with the same name that you need to continue taking as instructed.   lidocaine 5 % Commonly known as: LIDODERM   nystatin 100000 UNIT/ML suspension Commonly known as: MYCOSTATIN   oxyCODONE 5 MG immediate release tablet  Commonly known as: Oxy IR/ROXICODONE   Senexon-S 8.6-50 MG tablet Generic drug: senna-docusate       TAKE these medications    acetaminophen 160 MG/5ML solution Commonly known as: TYLENOL Place 31.3 mLs (1,000 mg total) into feeding tube every 6 (six)  hours. What changed:  how much to take additional instructions   albuterol 108 (90 Base) MCG/ACT inhaler Commonly known as: VENTOLIN HFA Inhale 2 puffs into the lungs every 6 (six) hours as needed for wheezing or shortness of breath.   alum & mag hydroxide-simeth 200-200-20 MG/5ML suspension Commonly known as: MAALOX/MYLANTA Place 30 mLs into feeding tube every 4 (four) hours as needed for indigestion, heartburn or flatulence.   amiodarone 200 MG tablet Commonly known as: PACERONE Place 1 tablet (200 mg total) into feeding tube daily.   apixaban 5 MG Tabs tablet Commonly known as: Eliquis Place 1 tablet (5 mg total) into feeding tube 2 (two) times daily.   azelastine 0.1 % nasal spray Commonly known as: ASTELIN Place 1 spray into both nostrils 2 (two) times daily.   bisacodyl 10 MG suppository Commonly known as: DULCOLAX Place 1 suppository (10 mg total) rectally daily as needed for moderate constipation.   budesonide-formoterol 160-4.5 MCG/ACT inhaler Commonly known as: SYMBICORT Inhale 2 puffs into the lungs 2 (two) times daily.   diphenoxylate-atropine 2.5-0.025 MG/5ML liquid Commonly known as: LOMOTIL Place 5 mLs into feeding tube 4 (four) times daily as needed for diarrhea or loose stools. Replaces: diphenoxylate-atropine 2.5-0.025 MG tablet   doxazosin 1 MG tablet Commonly known as: CARDURA Place 1 tablet (1 mg total) into feeding tube daily.   famotidine 20 MG tablet Commonly known as: PEPCID Place 1 tablet (20 mg total) into feeding tube 2 (two) times daily.   fluconazole 100 MG tablet Commonly known as: Diflucan Place 2 tablets (200 mg total) into feeding tube daily for 1 day, THEN 1 tablet (100 mg total) daily for 14 days. Start taking on: February 19, 2022   fluticasone 50 MCG/ACT nasal spray Commonly known as: FLONASE Place 2 sprays into both nostrils daily as needed for allergies or rhinitis.   free water Soln Place 100 mLs into feeding tube every 4  (four) hours.   meclizine 25 MG tablet Commonly known as: ANTIVERT Take 1 tablet (25 mg total) by mouth 3 (three) times daily as needed for dizziness.   metFORMIN 500 MG tablet Commonly known as: GLUCOPHAGE Take 1 tablet (500 mg total) by mouth daily with breakfast. What changed: how to take this   mirtazapine 7.5 MG tablet Commonly known as: REMERON Place 1 tablet (7.5 mg total) into feeding tube at bedtime.   nutrition supplement (JUVEN) Pack Place 1 packet into feeding tube 2 (two) times daily between meals. What changed: You were already taking a medication with the same name, and this prescription was added. Make sure you understand how and when to take each. Replaces: feeding supplement Liqd   feeding supplement (JEVITY 1.5 CAL/FIBER) Liqd Place 1,000 mLs into feeding tube daily. At 55 ml/hour What changed: You were already taking a medication with the same name, and this prescription was added. Make sure you understand how and when to take each.   feeding supplement (PROSource TF) liquid Place 45 mLs into feeding tube daily. What changed:  how much to take when to take this Another medication with the same name was removed. Continue taking this medication, and follow the directions you see here.   ondansetron 8 MG disintegrating tablet Commonly  known as: ZOFRAN-ODT Take 1 tablet (8 mg total) by mouth every 8 (eight) hours as needed for nausea or vomiting.   onetouch ultrasoft lancets Use to check blood sugars daily   OneTouch Verio test strip Generic drug: glucose blood TEST BLOOD SUGARS DAILY DX E11.9   pantoprazole 20 MG tablet Commonly known as: PROTONIX 40 mg every morning. Per tube   rosuvastatin 40 MG tablet Commonly known as: CRESTOR TAKE 1 TABLET BY MOUTH  DAILY What changed:  how to take this when to take this   Spacer/Aero-Hold Chamber Bags Misc 1 each by Does not apply route as needed.   venlafaxine 37.5 MG tablet Commonly known as:  EFFEXOR Place 0.5 tablets (18.75 mg total) into feeding tube 2 (two) times daily with a meal.   Zinc Oxide 12.8 % ointment Commonly known as: TRIPLE PASTE Apply topically 2 (two) times daily.               Discharge Care Instructions  (From admission, onward)           Start     Ordered   02/19/22 0000  Discharge wound care:       Comments: For G tube wound 1. Apply zinc oxide to the periwound skin of Gtube wound 2. Cut to fit silver hydrofiber (Aquacel Ag+), please have unit secretary order 4-5 pieces for use. Place in open wound at [place] site (entire wound) 3. Use (2) silicone foam cut like split gauze, and placed 1 in each direction to stabilize tube and to absorb drainage  4. Change every other day unless strike through on foam. If strike through occurs change daily.  For buttock wounds: Gently cleanse bilateral buttocks. Pat dry. Place Xeroform gauzes over the bilateral buttock discolored areas, then cover with a foam dressing. Change every 2 days and prn.   02/19/22 1325            Follow-up Information     [facility] Follow up.   Specialty: Home Health Services Contact information: [place] [address] [phone]         [facility] Follow up.   Contact information: wheel chair transportation call [phone] will need 3 business days notice        [facility] Follow up.   Why: [facility]   providing air overlay mattress, hoyer lift and wheel chair Contact information: [place] [address] [phone]         [facility] Follow up.   Why: providing tube feeding   [phone]        Irine Seal, MD Follow up.   Specialty: Urology Why: office will call you with appointment Contact information: [place] [address] [phone]         [name], DO. Schedule an appointment as soon as possible for a visit  in 2 week(s).   Specialty: Family Medicine Why: with CBC and BMP Contact information: [place] [address] [place]. Call.   Specialty: Radiology Why: As needed Contact information: [address] [id number] [place] [phone]        Surgery, [place]. Call.   Specialty: General Surgery Why: As needed Contact information: [place] [address] [phone]  Disposition: Home Diet recommendation: N.p.o. for now.  Advance to dysphagia 2 diet after 2 weeks.  Discharge Exam: Vitals:   02/18/22 1431 02/18/22 2014 02/19/22 0520 02/19/22 0853  BP: 119/64 93/75 (!) 112/54 (!) 111/52  Pulse: 73 83 75 73  Resp: 17 17 17 18   Temp: 97.7 F (36.5 C) 97.6 F (36.4 C) 98.5 F (36.9 C) 98.9 F (37.2 C)  TempSrc: Axillary Oral Oral Oral  SpO2: 98% 94% 96% 100%  Weight:    80.3 kg  Height:       General: Appear in no distress; no visible Abnormal Neck Mass Or lumps, Conjunctiva normal Cardiovascular: S1 and S2 Present, no Murmur, Respiratory: good respiratory effort, Bilateral Air entry present and CTA, no Crackles, no wheezes Abdomen: Bowel Sound present, Non tender Extremities: no Pedal edema Neurology: alert and oriented to time, place, and person  Gait not checked due to patient safety concerns Filed Weights   02/14/22 0500 02/15/22 0407 02/19/22 0853  Weight: 83 kg 84.8 kg 80.3 kg   Condition at discharge: stable  The results of significant diagnostics from this hospitalization (including imaging, microbiology, ancillary and laboratory) are listed below for reference.   Imaging Studies: IR GASTR TUBE CONVERT GASTR-JEJ PER W/FL MOD SED  Result Date: 02/10/2022 INDICATION: 88 year old male with wound at prior percutaneous gastrostomy site EXAM: CONVERT G-TUBE TO G-JTUBE MEDICATIONS: None  ANESTHESIA/SEDATION: None CONTRAST:  64mL OMNIPAQUE IOHEXOL 300 MG/ML SOLN - administered into the gastric lumen. FLUOROSCOPY: Radiation Exposure Index (as provided by the fluoroscopic device): 110 mGy Kerma COMPLICATIONS: None PROCEDURE: Informed written consent was obtained from the patient and the patient's family after a thorough discussion of the procedural risks, benefits and alternatives. All questions were addressed. Maximal Sterile Barrier Technique was utilized including caps, mask, sterile gowns, sterile gloves, sterile drape, hand hygiene and skin antiseptic. A timeout was performed prior to the initiation of the procedure. The epigastrium and indwelling G-tube were prepped with chlorhexidine in a sterile fashion, and a sterile drape was applied covering the operative field. A sterile gown and sterile gloves were used for the procedure. Initial images demonstrated the inflated balloon within the stomach lumen. Contrast was injected to confirm location. The rubber bumper was then loosened and the G-tube was pushed further into the stomach lumen to create space for navigation through the pylorus. Combination of a stiff Glidewire and a Kumpe the catheter were then used to navigate in a parallel manner through the ostomy site and the pylorus, into the duodenum. Once the wire and catheter were within the duodenum at the DJ junction, the balloon was deflated on the prior gastrostomy tube and removed from the tract. A 24 French formal gastro jejunostomy tube was modified with amputation of the final 15-20 cm. The feeding tube was then advanced over the stiff Glidewire into position. Balloon was inflated. We inspected the wound, and used a hemostat to identify the location of the residual T tack, which was superficial to the stomach lumen on multiple obliquities. Ultimately we were able to identify that the T tack was exposed at the ostomy site. The T tack was involved with some mesh material and suture material  from the prior hernia repair, and in order to remove the conglomerate, sharp excision using scissors was performed to remove the foreign body conglomerate. Contrast was then injected through the lumen of the J tube and gastric tube. Patient tolerated the procedure well and remained hemodynamically stable throughout. No complications were encountered and no significant blood  loss encountered. IMPRESSION: Image guided conversion of a gastric tube for a new 24 French gastrojejunostomy. Signed, Dulcy Fanny. Earleen Newport, DO [facility] Radiology Electronically Signed   By: Corrie Mckusick D.O.   On: 02/10/2022 10:49   US RENAL  Result Date: 02/04/2022 CLINICAL DATA:  Hematuria. EXAM: RENAL / URINARY TRACT ULTRASOUND COMPLETE COMPARISON:  Renal ultrasound dated 08/28/2021. CT abdomen pelvis dated 01/24/2022. FINDINGS: Right Kidney: Renal measurements: 12.4 x 5.8 x 5.0 cm = volume: 188 mL. There is mild diffuse parenchyma atrophy. Normal echogenicity. No hydronephrosis or shadowing stone. There is a 6.8 x 4.2 x 6.4 cm upper pole cyst. Additional cyst seen on the prior CT is poorly visualized on today's exam. Left Kidney: Renal measurements: 11.9 x 6.5 x 5.0 cm = volume: 200 mL. Mild parenchyma atrophy. Normal echogenicity. No hydronephrosis or shadowing stone. There is a 2.6 x 2.3 x 2.5 cm lateral lower pole cyst. Bladder: Small layering debris noted along the posterior bladder wall. Correlation with urinalysis recommended to exclude UTI. Other: None. IMPRESSION: 1. No hydronephrosis or shadowing stone. 2. Bilateral renal cysts. 3. Minimal bladder debris. Correlation with urinalysis recommended to exclude UTI. Electronically Signed   By: Anner Crete M.D.   On: 02/04/2022 19:43   DG Abd Portable 1V  Result Date: 02/02/2022 CLINICAL DATA:  NG tube placement EXAM: PORTABLE ABDOMEN - 1 VIEW COMPARISON:  02/01/2022 FINDINGS: Nonobstructive pattern of bowel gas. Percutaneous  gastrostomy tube projects with tip and balloon over the expected gastric body. Enteric feeding tube is positioned with tip in the transverse portion of the duodenum. No obvious free air. IMPRESSION: Enteric feeding tube tip in the transverse portion of the duodenum. Percutaneous gastrostomy tube projects with tip and balloon over the expected gastric body. Electronically Signed   By: Delanna Ahmadi M.D.   On: 02/02/2022 13:10   DG Abd 1 View  Result Date: 02/01/2022 CLINICAL DATA:  NG tube placement. EXAM: ABDOMEN - 1 VIEW COMPARISON:  01/31/2022. FINDINGS: Enteric feeding tube tip projects in the right lower central abdomen, consistent with positioning in the third portion of the duodenum, advanced when compared to the prior study. Normal bowel gas pattern. Percutaneous gastrostomy tube is unchanged. IMPRESSION: 1. Enteric feeding tube tip projects in the expected location of the third portion of the duodenum. Electronically Signed   By: Lajean Manes M.D.   On: 02/01/2022 14:06   DG Abd Portable 1V  Result Date: 01/31/2022 CLINICAL DATA:  NG tube placement. EXAM: PORTABLE ABDOMEN - 1 VIEW COMPARISON:  None Available. FINDINGS: No NG tube visible on the current film. The tip of a feeding catheter is positioned at the pylorus, directed towards the duodenum. Gastrostomy tube evident. Bowel gas pattern is nonspecific. IMPRESSION: Feeding tube tip is positioned at the pylorus, directed towards the duodenum. Electronically Signed   By: Misty Stanley M.D.   On: 01/31/2022 17:43   DG Cervical Spine With Flex & Extend  Result Date: 01/27/2022 CLINICAL DATA:  Known C6 fracture.  Neck pain and stiffness. EXAM: CERVICAL SPINE COMPLETE WITH FLEXION AND EXTENSION VIEWS COMPARISON:  Radiograph 01/07/2022, CT 12/10/2021 FINDINGS: AP, lateral neutral, flexion and extension views obtained. The fracture through the anterior inferior aspect of C6 vertebral body and anterior osteophyte is not well seen on the current exam,  better delineated on prior CT. Anterolisthesis of C2 on C3 is not seen on neutral, 6 mm on flexion and 3 mm on extension. Anterolisthesis of C3 on C4 is not  seen on neutral, 3 mm on flexion and 3 mm on extension. Anterolisthesis of C4 on C5 is 5 mm on neutral, 5 mm on flexion, and 0 mm on extension. Previous anterolisthesis of C5 on C6 is not seen on neutral, flexion, or extension. C6-C7 is normally aligned, but not well assessed on flexion. Multilevel degenerative disc disease and facet hypertrophy, better characterized on prior CT. No new radiographic abnormalities. IMPRESSION: 1. Known subacute fracture through the anterior inferior aspect of C6 vertebral body and anterior osteophyte is not well seen on the current exam, better delineated on prior CT. The C6-C7 disc space normally aligned without abnormal motion on extension, but not well assessed on flexion. 2. Anterolisthesis of C2 on C3, C3 on C4, and C4 on C5 as described. 3. Multilevel degenerative disc disease and facet hypertrophy, better characterized on prior CT. Electronically Signed   By: Keith Rake M.D.   On: 01/27/2022 17:36   DG Abd Portable 1V  Result Date: 01/27/2022 CLINICAL DATA:  NG tube placement. EXAM: PORTABLE ABDOMEN - 1 VIEW COMPARISON:  Abdominal radiograph January 26, 2022. FINDINGS: Enteric tube courses below the diaphragm with the tip probably in the distal stomach or proximal duodenum. Contrast is seen within partially imaged colon. Lower abdomen is not imaged. Lung bases are clear. Multilevel degenerative change of the spine. IMPRESSION: Enteric tube courses below the diaphragm with the tip probably in the distal stomach or proximal duodenum. Electronically Signed   By: [name] M.D.   On: 01/27/2022 15:34   ECHOCARDIOGRAM COMPLETE  Result Date: 01/27/2022    ECHOCARDIOGRAM REPORT   Patient Name:   HYATT CAPOBIANCO Butikofer Date of Exam: 01/27/2022 Medical Rec #:  [id number]      Height:       71.0 in Accession #:    [id number]      Weight:       190.3 lb Date of Birth:  September 05, 1932       BSA:          2.064 m Patient Age:    88 years       BP:           132/63 mmHg Patient Gender: M              HR:           74 bpm. Exam Location:  Inpatient Procedure: 2D Echo, Cardiac Doppler and Color Doppler Indications:    Other abnormalities of the heart R00.8  History:        Patient has prior history of Echocardiogram examinations, most                 recent 04/25/2020. CAD, Prior CABG, COPD and Carotid Disease,                 Arrythmias:Atrial Fibrillation; Risk Factors:Hypertension and                 Diabetes. GERD. AAA.  Sonographer:    Darlina Sicilian RDCS Referring Phys: [phone] A CALDWELL POWELL Gattman  1. Left ventricular ejection fraction, by estimation, is 40 to 45%. The left ventricle has mildly decreased function. The left ventricle demonstrates global hypokinesis. Left ventricular diastolic parameters are consistent with Grade I diastolic dysfunction (impaired relaxation).  2. Right ventricular systolic function is normal. The right ventricular size is normal.  3. The mitral valve is normal in structure. No evidence of mitral valve regurgitation. No evidence of mitral stenosis.  4. The right  coronary cusp appears to be restrictive and does not open, the left and noncoronary cusp does open well. The aortic valve is tricuspid. Aortic valve regurgitation is mild to moderate. Suspect there is mild aortic stenosis.  5. There is moderate dilatation of the aortic root, measuring 41 mm. There is moderate dilatation of the ascending aorta, measuring 41 mm.  6. The inferior vena cava is normal in size with greater than 50% respiratory variability, suggesting right atrial pressure of 3 mmHg. FINDINGS  Left Ventricle: Left ventricular ejection fraction, by estimation, is 40 to 45%. The left ventricle has mildly decreased function. The left ventricle demonstrates global hypokinesis. The left ventricular internal cavity size was normal in size.  There is  no left ventricular hypertrophy. Abnormal (paradoxical) septal motion, consistent with RV pacemaker. Left ventricular diastolic parameters are consistent with Grade I diastolic dysfunction (impaired relaxation). Right Ventricle: The right ventricular size is normal. No increase in right ventricular wall thickness. Right ventricular systolic function is normal. Left Atrium: Left atrial size was normal in size. Right Atrium: Right atrial size was normal in size. Pericardium: There is no evidence of pericardial effusion. Mitral Valve: The mitral valve is normal in structure. No evidence of mitral valve regurgitation. No evidence of mitral valve stenosis. Tricuspid Valve: The tricuspid valve is normal in structure. Tricuspid valve regurgitation is not demonstrated. No evidence of tricuspid stenosis. Aortic Valve: The right coronary cusp appears to be restrictive and does not open, the left and noncoronary cusp does open well. The aortic valve is tricuspid. Aortic valve regurgitation is mild to moderate. Aortic regurgitation PHT measures 561 msec. Suspect there is mild aortic stenosis. Pulmonic Valve: The pulmonic valve was not well visualized. Pulmonic valve regurgitation is not visualized. No evidence of pulmonic stenosis. Aorta: There is moderate dilatation of the aortic root, measuring 41 mm. There is moderate dilatation of the ascending aorta, measuring 41 mm. Venous: The inferior vena cava is normal in size with greater than 50% respiratory variability, suggesting right atrial pressure of 3 mmHg. IAS/Shunts: No atrial level shunt detected by color flow Doppler. Additional Comments: A device lead is visualized.  LEFT VENTRICLE PLAX 2D LVIDd:         6.40 cm   Diastology LVIDs:         4.60 cm   LV e' medial:    5.35 cm/s LV PW:         1.20 cm   LV E/e' medial:  12.4 LV IVS:        1.20 cm   LV e' lateral:   5.27 cm/s LVOT diam:     2.80 cm   LV E/e' lateral: 12.6 LV SV:         77 LV SV Index:   37 LVOT  Area:     6.16 cm  LEFT ATRIUM             Index LA diam:        3.10 cm 1.50 cm/m LA Vol (A2C):   36.4 ml 17.63 ml/m LA Vol (A4C):   50.8 ml 24.63 ml/m LA Biplane Vol: 40.2 ml 19.47 ml/m  AORTIC VALVE LVOT Vmax:   74.40 cm/s LVOT Vmean:  50.700 cm/s LVOT VTI:    0.125 m AI PHT:      561 msec  AORTA Ao Root diam: 4.10 cm Ao Asc diam:  4.05 cm MITRAL VALVE MV Area (PHT): 3.70 cm    SHUNTS MV Decel Time: 205 msec  Systemic VTI:  0.12 m MV E velocity: 66.40 cm/s  Systemic Diam: 2.80 cm MV A velocity: 96.00 cm/s MV E/A ratio:  0.69 Kardie Tobb DO Electronically signed by Berniece Salines DO Signature Date/Time: 01/27/2022/11:17:13 AM    Final    DG ABDOMEN PEG TUBE LOCATION  Result Date: 01/26/2022 CLINICAL DATA:  88 year old male with feeding tube dysfunction. Peg tube contrast injection. EXAM: ABDOMEN - 1 VIEW COMPARISON:  CT Abdomen and Pelvis 01/24/2022. FINDINGS: Portable AP supine view at 0420 hours. Injected contrast via percutaneous gastrostomy tube outlines the gastric fundus, other gastric mucosa, and proximal duodenum with no abnormal pooling or extravasation identified. Retained contrast mixed with stool again noted in the right colon. Non obstructed bowel gas pattern. Stable lung bases. Cardiac pacemaker leads. Stable cholecystectomy clips. Bulky spinal endplate osteophytosis. No acute osseous abnormality identified. IMPRESSION: No extravasation or adverse features identified on contrast injection of gastrostomy tube. Electronically Signed   By: Genevie Ann M.D.   On: 01/26/2022 04:47   IR Replc Gastro/Colonic Tube Percut W/Fluoro  Result Date: 01/25/2022 INDICATION: Leaking gastrostomy tube. Concern for abscess surrounding subcutaneous track of gastrostomy tube Request made for fluoroscopic guided gastrostomy tube exchange with potential up sizing. EXAM: FLUOROSCOPIC GUIDED REPLACEMENT OF GASTROSTOMY TUBE COMPARISON:  Image guided percutaneous gastrostomy tube placement-01/15/2022; CT abdomen  pelvis-01/24/2022 MEDICATIONS: None. CONTRAST:  82mL OMNIPAQUE IOHEXOL 300 MG/ML SOLN - administered into the gastric lumen FLUOROSCOPY TIME:  1 minute, 6 seconds (10 mGy) COMPLICATIONS: None immediate. PROCEDURE: A timeout was performed prior to the initiation of the procedure. The upper abdomen and external portion of the existing gastrostomy tube was prepped and draped in the usual sterile fashion, and a sterile drape was applied covering the operative field. Maximum barrier sterile technique with sterile gowns and gloves were used for the procedure. A timeout was performed prior to the initiation of the procedure. The existing gastrostomy tube was injected with a small amount of contrast confirming appropriate positioning within the gastric lumen. The external portion of the gastrostomy tube was trimmed and cannulated with a short Amplatz wire was coiled within the gastric lumen to the level of the gastric fundus. Next, utilizing gentle traction, the disc retention gastrostomy tube was removed intact. Next, the track was serially dilated ultimately allowing placement of a new 24 French balloon inflatable gastrostomy tube. The balloon was inflated with approximately 20 cc of saline and dilute contrast and pulled against the anterior interval of the stomach. The external disc was gently cinched at the skin entrance surface site. Contrast injection demonstrated appropriate positioning and functionality of the new, slightly larger gastrostomy tube. Next, time was spent debriding the apparent pressure ulcer about the medial aspect of the gastrostomy tube entrance site with expression of a moderate amount of purulent fluid undermining the adjacent subcutaneous tissues. Dressings were applied. The patient tolerated the procedure well without immediate postprocedural complication. IMPRESSION: Successful fluoroscopic guided exchange and up sizing of now 24 French gastrostomy tube. The gastrostomy tube is ready for  immediate use. Given apparent pressure ulcer adjacent to the medial aspect of the gastrostomy tube insertion site, wound care consultation may be performed as indicated. Electronically Signed   By: Sandi Mariscal M.D.   On: 01/25/2022 14:19   CT ABDOMEN PELVIS W CONTRAST  Result Date: 01/24/2022 CLINICAL DATA:  Abdominal pain and distention purulent discharge at the site of gastrostomy placement EXAM: CT ABDOMEN AND PELVIS WITH CONTRAST TECHNIQUE: Multidetector CT imaging of the abdomen and pelvis was performed using the  standard protocol following bolus administration of intravenous contrast. RADIATION DOSE REDUCTION: This exam was performed according to the departmental dose-optimization program which includes automated exposure control, adjustment of the mA and/or kV according to patient size and/or use of iterative reconstruction technique. CONTRAST:  156mL OMNIPAQUE IOHEXOL 300 MG/ML  SOLN COMPARISON:  12/11/2021 FINDINGS: Lower chest: Heart is enlarged in size. Coronary artery calcifications are seen. There are metallic sutures in the sternum. Pacemaker leads are noted in the heart. Hepatobiliary: No focal abnormality is seen in the liver. There is slight prominence of intrahepatic bile ducts. Surgical clips are seen in gallbladder fossa. There is no significant dilation of extrahepatic bile ducts. Pancreas: No focal abnormality is seen. Spleen: Spleen measures 15.9 cm in maximum diameter. Adrenals/Urinary Tract: Adrenals are unremarkable. There is no hydronephrosis. Bilateral renal cysts are seen. Largest of the cysts is seen in the anterior upper pole of right kidney measuring 7.1 cm. Some of the low-density lesions are subcentimeter in size limiting characterization. There are no renal or ureteral stones. Foley catheter is seen in the bladder. There is subtle increased density in the dependent portion of urinary bladder. Stomach/Bowel: Small hiatal hernia is seen. Stomach is not distended. There is interval  placement of gastrostomy. Balloon in the gastrostomy tube is noted projecting in the anterior margin of the gastric wall. There is mild stranding in the perigastric region at the site of gastrostomy. There is 6.4 x 2.2 cm area of inflammatory phlegmon in the subcutaneous plane at the site of gastrostomy placement. There are small pockets of air in the subcutaneous plane at the site of gastrostomy. This may be due to open wound in the skin or suggest infectious process in the subcutaneous plane. There is no definite demonstrable thick-walled fluid collection. Small bowel loops are not dilated. Appendix is not dilated. There is no significant wall thickening in colon. Vascular/Lymphatic: There are scattered arterial calcifications. There is seen low-density in the infrarenal abdominal aorta, residual change from aortobifemoral bypass graft. The stent appears to be patent. There is ectasia of left common iliac artery measuring 2.1 cm in the AP diameter. Reproductiveprostate is not enlarged. Please correlate for possible previous intervention in the prostate. Other: There is no ascites or pneumoperitoneum. There is ventral hernia containing fat to the left of midline above the level of umbilicus. Bilateral inguinal hernias containing fat are seen, larger on the right side. Musculoskeletal: Degenerative changes are noted in the lumbar spine. Healing fractures are seen in the right ala of sacrum posterior right iliac bone close to the SI joint right superior pubic ramus and right inferior pubic ramus. Mixed attenuation seen in the body of T12 vertebra as not changed. IMPRESSION: There is 6.4 x 2.2 cm area of inflammatory/infectious phlegmon in the subcutaneous plane at the site of placement of gastrostomy. There are pockets of air in the subcutaneous plane at the site gastrostomy tube placement. This may be due to open wound in the skin or suggest infection with gas producing organisms. There is no definite demonstrable  thick-walled loculated fluid collection at the site of gastrostomy tube placement. Balloon in the tip of gastrostomy tube is projecting immediately anterior to the anterior wall of the stomach. Possibility of inflated balloon in the gastrostomy tube to be lying immediately anterior to the gastric wall is not excluded. There is no evidence of pneumoperitoneum or significant ascites. There is no evidence of intestinal obstruction or pneumoperitoneum. Appendix is not dilated. There is no hydronephrosis. Appendix is not dilated. Small  hiatal hernia. Coronary artery disease. Bilateral renal cysts. Healing fractures are seen in the right ala of sacrum, right iliac bone, right superior pubic ramus and right inferior pubic ramus. Other findings as described in the body of the report. Electronically Signed   By: Elmer Picker M.D.   On: 01/24/2022 17:26   DG Chest Portable 1 View  Result Date: 01/24/2022 CLINICAL DATA:  chest pain, drainage around peg site EXAM: PORTABLE CHEST 1 VIEW COMPARISON:  Chest radiograph Jan 06, 2022. FINDINGS: Low lung volumes. No consolidation. No visible pleural effusions or pneumothorax. Left subclavian approach cardiac rhythm maintenance device in similar position. CABG and median sternotomy. Similar mild enlargement the cardiac silhouette. No displaced fracture identified. IMPRESSION: No evidence of acute cardiopulmonary disease. Electronically Signed   By: [name] M.D.   On: 01/24/2022 14:49    Microbiology: Results for orders placed or performed during the hospital encounter of 01/24/22  Urine Culture     Status: Abnormal   Collection Time: 01/24/22  3:12 PM   Specimen: Urine, Clean Catch  Result Value Ref Range Status   Specimen Description URINE, CLEAN CATCH  Final   Special Requests   Final    NONE Performed at [facility], [place] [address], [place], [place] [address]    Culture (A)  Final    40,000 COLONIES/mL ENTEROCOCCUS FAECIUM VANCOMYCIN  RESISTANT ENTEROCOCCUS ISOLATED    Report Status 01/27/2022 FINAL  Final   Organism ID, Bacteria ENTEROCOCCUS FAECIUM (A)  Final      Susceptibility   Enterococcus faecium - MIC*    AMPICILLIN >=32 RESISTANT Resistant     NITROFURANTOIN 64 INTERMEDIATE Intermediate     VANCOMYCIN >=32 RESISTANT Resistant     LINEZOLID 2 SENSITIVE Sensitive     * 40,000 COLONIES/mL ENTEROCOCCUS FAECIUM  Blood culture (routine x 2)     Status: None   Collection Time: 01/24/22  3:24 PM   Specimen: BLOOD LEFT HAND  Result Value Ref Range Status   Specimen Description BLOOD LEFT HAND  Final   Special Requests   Final    BOTTLES DRAWN AEROBIC AND ANAEROBIC Blood Culture adequate volume   Culture   Final    NO GROWTH 5 DAYS Performed at [facility], [place] [address] [name], [place] [address]    Report Status 01/29/2022 FINAL  Final  Blood culture (routine x 2)     Status: None   Collection Time: 01/24/22  3:24 PM   Specimen: BLOOD RIGHT HAND  Result Value Ref Range Status   Specimen Description BLOOD RIGHT HAND  Final   Special Requests   Final    BOTTLES DRAWN AEROBIC AND ANAEROBIC Blood Culture adequate volume   Culture   Final    NO GROWTH 5 DAYS Performed at [facility], [place] [address], [place], [place] [address]    Report Status 01/29/2022 FINAL  Final  Gastrointestinal Panel by PCR , Stool     Status: None   Collection Time: 01/24/22  9:08 PM   Specimen: Peg Site; Stool  Result Value Ref Range Status   Campylobacter species NOT DETECTED NOT DETECTED Final   Plesimonas shigelloides NOT DETECTED NOT DETECTED Final   Salmonella species NOT DETECTED NOT DETECTED Final   Yersinia enterocolitica NOT DETECTED NOT DETECTED Final   Vibrio species NOT DETECTED NOT DETECTED Final   Vibrio cholerae NOT DETECTED NOT DETECTED Final   Enteroaggregative E coli (EAEC) NOT DETECTED NOT DETECTED Final  Enteropathogenic E coli (EPEC) NOT DETECTED NOT DETECTED Final   Enterotoxigenic E coli  (ETEC) NOT DETECTED NOT DETECTED Final   Shiga like toxin producing E coli (STEC) NOT DETECTED NOT DETECTED Final   Shigella/Enteroinvasive E coli (EIEC) NOT DETECTED NOT DETECTED Final   Cryptosporidium NOT DETECTED NOT DETECTED Final   Cyclospora cayetanensis NOT DETECTED NOT DETECTED Final   Entamoeba histolytica NOT DETECTED NOT DETECTED Final   Giardia lamblia NOT DETECTED NOT DETECTED Final   Adenovirus F40/41 NOT DETECTED NOT DETECTED Final   Astrovirus NOT DETECTED NOT DETECTED Final   Norovirus GI/GII NOT DETECTED NOT DETECTED Final   Rotavirus A NOT DETECTED NOT DETECTED Final   Sapovirus (I, II, IV, and V) NOT DETECTED NOT DETECTED Final    Comment: Performed at [facility], [address] [place], [place] [address]  C Difficile Quick Screen w PCR reflex     Status: None   Collection Time: 01/24/22  9:08 PM   Specimen: Peg Site; Stool  Result Value Ref Range Status   C Diff antigen NEGATIVE NEGATIVE Final   C Diff toxin NEGATIVE NEGATIVE Final   C Diff interpretation No C. difficile detected.  Final    Comment: Performed at [facility], [address] [place], [place] [address]  Aerobic Culture w Gram Stain (superficial specimen)     Status: None   Collection Time: 01/24/22 10:07 PM   Specimen: Wound  Result Value Ref Range Status   Specimen Description WOUND  Final   Special Requests PEG SITE  Final   Gram Stain   Final    NO SQUAMOUS EPITHELIAL CELLS SEEN FEW WBC SEEN FEW GRAM POSITIVE COCCI ABUNDANT GRAM NEGATIVE RODS Performed at [facility], [address] [place], [place] [address]    Culture   Final    MODERATE STAPHYLOCOCCUS EPIDERMIDIS MODERATE CANDIDA ALBICANS    Report Status 01/27/2022 FINAL  Final   Organism ID, Bacteria STAPHYLOCOCCUS EPIDERMIDIS  Final      Susceptibility   Staphylococcus epidermidis - MIC*    CIPROFLOXACIN >=8 RESISTANT Resistant     ERYTHROMYCIN <=0.25 SENSITIVE Sensitive     GENTAMICIN <=0.5  SENSITIVE Sensitive     OXACILLIN RESISTANT Resistant     TETRACYCLINE <=1 SENSITIVE Sensitive     VANCOMYCIN 2 SENSITIVE Sensitive     TRIMETH/SULFA 20 SENSITIVE Sensitive     CLINDAMYCIN >=8 RESISTANT Resistant     RIFAMPIN <=0.5 SENSITIVE Sensitive     Inducible Clindamycin NEGATIVE Sensitive     * MODERATE STAPHYLOCOCCUS EPIDERMIDIS  Urine Culture     Status: Abnormal   Collection Time: 02/05/22  8:26 AM   Specimen: Urine, Clean Catch  Result Value Ref Range Status   Specimen Description URINE, CLEAN CATCH  Final   Special Requests   Final    NONE Performed at [facility], [address] [place], [place] [address]    Culture (A)  Final    40,000 COLONIES/mL ENTEROCOCCUS FAECIUM VANCOMYCIN RESISTANT ENTEROCOCCUS ISOLATED    Report Status 02/07/2022 FINAL  Final   Organism ID, Bacteria ENTEROCOCCUS FAECIUM (A)  Final      Susceptibility   Enterococcus faecium - MIC*    AMPICILLIN >=32 RESISTANT Resistant     NITROFURANTOIN 64 INTERMEDIATE Intermediate     VANCOMYCIN >=32 RESISTANT Resistant     LINEZOLID 2 SENSITIVE Sensitive     * 40,000 COLONIES/mL ENTEROCOCCUS FAECIUM    Labs: CBC: Recent Labs  Lab 02/14/22 0858 02/15/22 0102 02/16/22 0151 02/17/22 0056 02/19/22 0145  WBC 3.6* 3.4* 4.1 3.5* 3.6*  HGB 8.5* 7.3* 7.5* 7.6* 7.8*  HCT 27.0* 24.3* 24.1* 24.3* 25.7*  MCV 101.5* 101.3* 102.1* 100.8* 103.2*  PLT 101* 97* 91* 86* 76*   Basic Metabolic Panel: Recent Labs  Lab 02/14/22 0858 02/15/22 0102 02/16/22 0151 02/17/22 0056 02/19/22 0145  NA 136 137 136 131* 138  K 4.3 4.1 4.3 4.4 4.4  CL 108 109 107 103 105  CO2 $Re'24 22 23 23 25  'LTc$ GLUCOSE 150* 160* 136* 152* 127*  BUN 27* 31* 34* 36* 44*  CREATININE 0.66 0.56* 0.63 0.61 0.68  CALCIUM 8.7* 8.5* 8.6* 8.6* 9.0   Liver Function Tests: No results for input(s): "AST", "ALT", "ALKPHOS", "BILITOT", "PROT", "ALBUMIN" in the last 168 hours. CBG: Recent Labs  Lab 02/18/22 0556 02/18/22 1150  02/18/22 1714 02/19/22 0519 02/19/22 1139  GLUCAP 100* 102* 109* 114* 133*    Discharge time spent: greater than 30 minutes.  Signed: Berle Mull, MD [facility] 02/19/2022

## 2022-02-20 NOTE — Discharge Summary (Incomplete)
Physician Discharge Summary   Patient: [name] MRN: [id number] DOB: 1933-05-06  Admit date:     01/24/2022  Discharge date: 02/19/2022  Discharge Physician: Berle Mull  PCP: [name], DO  Recommendations at discharge: Follow-up with PCP in 1 week.  Recheck CBC and BMP Follow-up with dietitian and once the patient is able to tolerate p.o. adequately mucous nutritional requirement consider PCP follow-up for referral to radiology for [place] tube removal. Currently no indication to follow-up with general surgery or IR.  Numbers provided for emergency.  Discharge Diagnoses: Principal Problem:   Cellulitis Active Problems:   Hypotension   Paroxysmal atrial fibrillation (HCC)   HFrEF (heart failure with reduced ejection fraction) (HCC)   C6 cervical fracture (HCC)   Controlled type 2 diabetes mellitus without complication, without long-term current use of insulin (HCC)   Delirium   S/P CABG x 3   Hypernatremia   Dysphagia   Anemia   Urinary retention   Diarrhea   Abnormal LFTs   Pressure ulcer   Goals of care, counseling/discussion   Protein-calorie malnutrition, severe   Status post insertion of percutaneous endoscopic gastrostomy (PEG) tube ([place])   Hematuria  Hospital Course: [name] is a 88 y.o. male with medical history significant of paroxysmal atrial fibrillation, chronic systolic CHF, CAD s/p CABG, sick sinus syndrome s/p pacemaker, COPD, type 2 diabetes, hypertension, hyperlipidemia, chronic thrombocytopenia, recurrent UTI, OSA not on CPAP, dysphagia with PEG tube who presents with concerns of purulent discharge from feeding tube.  S/p upsizing of g tube on 6/3.  Surgery c/s and recommended keeping g tube to gravity to ensure no pressure of phlange on skin and balloon not pulled up against intraabdominal wall.  Per Dr. Pascal Lux, suspected pressure ulcer about medial aspect of g tube insertion - likely due to insertion angle of tube and external retention disc  being too tight. General surgery is following periodically. They recommended keeping G tube to gravity to ensure no pressure on skin and balloon not pulled up against intraabdominal wall.   [name] underwent cortrak placement for tube feeding. For the next several days, patient was continued on core track feeding while G tube was site was monitored for healing. 6/19, patient underwent image guided conversion of G-tube to [place] feeding tube.  Per IR note, the t-tack/mesh conglomerate was sharply excised. See below for details   Assessment and Plan: Cellulitis of the abdominal wall at PEG tube insertion site Pt has dysphagia and is s/p PEG tube placement on 5/24. Presents with malodor and purulent discharge from PEG tube site. CT abd/pelvis showed 6.4x2.2 cm area of inflammatory/infectious phlegmon in subcutaneous plaine at site of placement of gastrostomy, pockets of air in the subcutaneous plane at the site of g tube placement (due to open wound in skin or suggest infection with gas producing organisms).  No definite demonstrable thick walled loculated fluid collection. S/p fluoroscopic guided replacement and up sizing of now 24 fr ballon retention gastrostomy tube 6/3 per Dr. Pascal Lux, suspected pressure ulcer about medial aspect of g tube insertion - likely due to insertion angle of tube and external retention disc being too tight             - g tube to gravity initially.             - local wound care (zinc oxide to intact portion of epidermis, dressing changes around g tube 4x daily)             -  post pyloric nutrition             - wound care             - available as needed, IR can follow up prn as well Issue at this time is g tube is to gravity and [name] had cortrak while we're allowing wound to heal.  It's not clear how long [name]'ll need g tube to gravity and post pyloric feeding at this point per general surgery early on during the hospitalization.  -Status post full course Ancef. ([name]'s tolerated  cephalosporins and augmentin in past per pharmacy) -Wound cultures  (moderate staph epi, moderate candida albicans - not sure how clinically relevant these are - will follow) -blood cultures negative. -Wound care consulted, appreciate recs - medi honey added -On 01/29/2022 in the evening it was noted per RN and daughter that wound had looked worse and crossed, came and assessed the patient and IV antibiotics broadened back to vancomycin and Merrem. -Patient reassessed by general surgery again 01/30/2022 and stating wound from a pressure necrosis from his G-tube, recommending continued current wound care, de-escalation of antibiotics, and if wound is not shrinking over the next 1 to 2 weeks other considerations will need to be entertained at that time.  - IV antibiotics narrowed back to Ancef and patient completed a 10-day course of antibiotic treatment. -6/19 patient underwent image guided conversion of G-tube to [place] feeding tube.  Per IR note that T tack mesh conglomerate was sharply excised. -Continue current local wound care. -Per general surgery no need for any systemic antibiotics at this time, general surgery was following. -Tube feeds started dietitian following. -Cortrack discontinued. -Appreciate general surgery's input and recommendations. -Will need outpatient follow-up with general surgery   Hypotension Presented with hypotension with SBP of 90 over 50s.  This has improved with fluid but remains soft.  Previously on Lasix, Entresto, Aldactone and beta-blockers at home but these were hold during last admission.   -Continue to hold hypertensive medications secondary to low blood pressure. -Saline lock IV fluids.  -repeat echo -> EF 40-45% - BP overall improved, follow   C6 cervical fracture ([place]) This is from a ground-level fall back in April.  Was on a soft cervical collar and continue follow-up with neurosurgery. Discussed with Dr. Ellene Route, [name] notes ok to d/c c collar Flex ex films   with known subacute fx through anterior inferior aspect of c 6 vertebral body, anterior osteophyte not well seen, C6-7 space normally aligned without abnormal motion on extension, not well assessed on flexion (see report) -Patient seen by neurosurgery who states C6 fracture is healed and patient can be removed from collar and pursue activities as tolerated.   HFrEF (heart failure with reduced ejection fraction) ([place]) Appears euvolemic on exam. Echo as above, -Prior to admission patient noted to be on Lasix, Entresto, Aldactone and beta-blockers which are currently on hold due to soft blood pressure.   Paroxysmal atrial fibrillation (HCC) -Rate controlled on amiodarone.   -Eliquis held due to ongoing hematuria. -Urology recommending Eliquis may be resumed once urine has remained clear for greater than 2 days.  -Urine now clear for 48 hours and as such we will resume Eliquis.   Delirium Delirium precautions -Improved.   Controlled type 2 diabetes mellitus without complication, without long-term current use of insulin (HCC) Controlled.  Last A1c 5.2% on 5/15. -CBG 109 this morning.   -Follow.   Hypernatremia - Likely secondary to dehydration.   -Improved with hydration.   -  Patient placed on gentle hydration 01/31/2022 due to malposition of cortrack tube. -Cortrack placed back and was in position as such IV fluids discontinued.   -Patient now with a GJ tube and cortrack discontinued and patient on tube feeds.   -Tube feeds changed to cyclical feeds which patient is tolerating.   -Sodium at 131 (02/17/2022). -Follow.   S/P CABG x 3 Stable and asymptomatic from cardiac standpoint.   Anemia   Anemia labs c/w aocd, iron def -Patient with hematuria which has improved and seems to be resolving.  -Status post transfusion 1 unit packed red blood cells 02/09/2022. -Hemoglobin stable at 7.6. -Transfusion threshold hemoglobin < 7.      Dysphagia S/p PEG tube by VIR on 5/24, s/p replacement  as above -Dietitian consulted for tube feeding  -The afternoon of 01/31/2022, concerned that tube feeds coming out of PEG tube site and concern for malposition of cortrack. -Abdominal films obtained and feeding tube tip noted at the position of the pylorus directed towards the duodenum. -Advanced cortrack 4 cm, 02/01/2022 with repeat abdominal films showing appropriate placement of cortrack tube.  -Resumed tube feeds. -IV fluids have been discontinued. -6/19 patient underwent image guided conversion of G-tube to [place] feeding tube. -Tube feeds resumed with dietitian following. -Cortrack discontinued -Dietitian following and appreciate their input and recommendations.   Urinary retention Foley catheter placed on 5/27 due to urinary retention at recent admission.  [name] has planned outpatient follow-up with urology. -Patient underwent successful voiding trial.  -Urine output 3500 cc over the past 24 hours. -Patient with good urine output post Foley catheter removal with intermittent hematuria noted.  -Continue doxazosin. -Monitor urine output. -Urology following.   Diarrhea Daughter reported persistent diarrhea since the start of tube feed on 5/25.  -Tube feeds changed with some improvement with diarrhea. -Patient with some diarrhea yesterday. Negative GI pathogen panel Negative c diff   Abnormal LFTs Mildly elevated -LFTs trending down. -Follow.   Pressure ulcer Pressure Injury 01/24/22 Buttocks Deep Tissue Pressure Injury - Purple or maroon localized area of discolored intact skin or blood-filled blister due to damage of underlying soft tissue from pressure and/or shear. (Active)  01/24/22 2036  Location: Buttocks  Location Orientation:   Staging: Deep Tissue Pressure Injury - Purple or maroon localized area of discolored intact skin or blood-filled blister due to damage of underlying soft tissue from pressure and/or shear.  Wound Description (Comments):   Present on Admission: Yes     Wound c/s     Goals of care, counseling/discussion Not currently interested in palliative care discussions at this time.   Hematuria - Patient with hematuria noted since 02/04/2022. -Patient with history of prostate cancer status postradiation under the care of Dr. Jeffie Pollock. -Patient denies any abdominal pain, no dysuria, no nausea, no vomiting, no CVA tenderness. -Per daughter patient with prior history of nephrolithiasis. -Renal ultrasound with no hydronephrosis or shadowing stone, bilateral renal cyst, minimal bladder debris.   -Urine cultures positive for VRE -Foley catheter initially placed with irrigation however with no significant improvement with hematuria and as such Foley catheter discontinued per urology recommendations. -Patient seen in consultation by urology, Dr. Jeffie Pollock on 02/10/2022 who recommended removal of Foley catheter, treatment of UTI and monitoring hematuria off Eliquis for 1 to 2 days and if no significant improvement will likely need cystoscopy and possible fulguration.   -Patient now with intermittent hematuria which is improving daily. -Continue treatment of VRE with Zyvox which has been transitioned to Zyvox per tube 02/16/2022  and will treat for total of 10 days.. -Supportive care. -Monitor closely with resumption of Eliquis.   Protein-calorie malnutrition, severe - Patient with history of dysphagia and poor oral intake and as evidenced by severe fat depletion, severe muscle depletion, underwent PEG tube placement 5/24 with initiation of tube feeds 5/25. -Continue current tube feeds. -Dietitian following.  Consultants: *** Procedures performed:  *** DISCHARGE MEDICATION: Allergies as of 02/19/2022       Reactions   Penicillins Other (See Comments)   Unknown reaction -- Tolerated Augmentin courses 2019, 2023; Tolerates cephalosporins    Tramadol Other (See Comments)   Dizzy   Ketorolac Tromethamine Rash        Medication List     STOP taking these  medications    diphenoxylate-atropine 2.5-0.025 MG tablet Commonly known as: Lomotil Replaced by: diphenoxylate-atropine 2.5-0.025 MG/5ML liquid   docusate sodium 100 MG capsule Commonly known as: COLACE   feeding supplement Liqd Replaced by: nutrition supplement (JUVEN) Pack You also have another medication with the same name that you need to continue taking as instructed.   lidocaine 5 % Commonly known as: LIDODERM   nystatin 100000 UNIT/ML suspension Commonly known as: MYCOSTATIN   oxyCODONE 5 MG immediate release tablet Commonly known as: Oxy IR/ROXICODONE   Senexon-S 8.6-50 MG tablet Generic drug: senna-docusate       TAKE these medications    acetaminophen 160 MG/5ML solution Commonly known as: TYLENOL Place 31.3 mLs (1,000 mg total) into feeding tube every 6 (six) hours. What changed:  how much to take additional instructions   albuterol 108 (90 Base) MCG/ACT inhaler Commonly known as: VENTOLIN HFA Inhale 2 puffs into the lungs every 6 (six) hours as needed for wheezing or shortness of breath.   alum & mag hydroxide-simeth 200-200-20 MG/5ML suspension Commonly known as: MAALOX/MYLANTA Place 30 mLs into feeding tube every 4 (four) hours as needed for indigestion, heartburn or flatulence.   amiodarone 200 MG tablet Commonly known as: PACERONE Place 1 tablet (200 mg total) into feeding tube daily.   apixaban 5 MG Tabs tablet Commonly known as: Eliquis Place 1 tablet (5 mg total) into feeding tube 2 (two) times daily.   azelastine 0.1 % nasal spray Commonly known as: ASTELIN Place 1 spray into both nostrils 2 (two) times daily.   bisacodyl 10 MG suppository Commonly known as: DULCOLAX Place 1 suppository (10 mg total) rectally daily as needed for moderate constipation.   budesonide-formoterol 160-4.5 MCG/ACT inhaler Commonly known as: SYMBICORT Inhale 2 puffs into the lungs 2 (two) times daily.   diphenoxylate-atropine 2.5-0.025 MG/5ML  liquid Commonly known as: LOMOTIL Place 5 mLs into feeding tube 4 (four) times daily as needed for diarrhea or loose stools. Replaces: diphenoxylate-atropine 2.5-0.025 MG tablet   doxazosin 1 MG tablet Commonly known as: CARDURA Place 1 tablet (1 mg total) into feeding tube daily.   famotidine 20 MG tablet Commonly known as: PEPCID Place 1 tablet (20 mg total) into feeding tube 2 (two) times daily.   fluconazole 100 MG tablet Commonly known as: Diflucan Place 2 tablets (200 mg total) into feeding tube daily for 1 day, THEN 1 tablet (100 mg total) daily for 14 days. Start taking on: February 19, 2022   fluticasone 50 MCG/ACT nasal spray Commonly known as: FLONASE Place 2 sprays into both nostrils daily as needed for allergies or rhinitis.   free water Soln Place 100 mLs into feeding tube every 4 (four) hours.   meclizine 25 MG tablet Commonly known as:  ANTIVERT Take 1 tablet (25 mg total) by mouth 3 (three) times daily as needed for dizziness.   metFORMIN 500 MG tablet Commonly known as: GLUCOPHAGE Take 1 tablet (500 mg total) by mouth daily with breakfast. What changed: how to take this   mirtazapine 7.5 MG tablet Commonly known as: REMERON Place 1 tablet (7.5 mg total) into feeding tube at bedtime.   nutrition supplement (JUVEN) Pack Place 1 packet into feeding tube 2 (two) times daily between meals. What changed: You were already taking a medication with the same name, and this prescription was added. Make sure you understand how and when to take each. Replaces: feeding supplement Liqd   feeding supplement (JEVITY 1.5 CAL/FIBER) Liqd Place 1,000 mLs into feeding tube daily. At 55 ml/hour What changed: You were already taking a medication with the same name, and this prescription was added. Make sure you understand how and when to take each.   feeding supplement (PROSource TF) liquid Place 45 mLs into feeding tube daily. What changed:  how much to take when to take  this Another medication with the same name was removed. Continue taking this medication, and follow the directions you see here.   ondansetron 8 MG disintegrating tablet Commonly known as: ZOFRAN-ODT Take 1 tablet (8 mg total) by mouth every 8 (eight) hours as needed for nausea or vomiting.   onetouch ultrasoft lancets Use to check blood sugars daily   OneTouch Verio test strip Generic drug: glucose blood TEST BLOOD SUGARS DAILY DX E11.9   pantoprazole 20 MG tablet Commonly known as: PROTONIX 40 mg every morning. Per tube   rosuvastatin 40 MG tablet Commonly known as: CRESTOR TAKE 1 TABLET BY MOUTH  DAILY What changed:  how to take this when to take this   Spacer/Aero-Hold Chamber Bags Misc 1 each by Does not apply route as needed.   venlafaxine 37.5 MG tablet Commonly known as: EFFEXOR Place 0.5 tablets (18.75 mg total) into feeding tube 2 (two) times daily with a meal.   Zinc Oxide 12.8 % ointment Commonly known as: TRIPLE PASTE Apply topically 2 (two) times daily.               Discharge Care Instructions  (From admission, onward)           Start     Ordered   02/19/22 0000  Discharge wound care:       Comments: For G tube wound 1. Apply zinc oxide to the periwound skin of Gtube wound 2. Cut to fit silver hydrofiber (Aquacel Ag+), please have unit secretary order 4-5 pieces for use. Place in open wound at [place] site (entire wound) 3. Use (2) silicone foam cut like split gauze, and placed 1 in each direction to stabilize tube and to absorb drainage  4. Change every other day unless strike through on foam. If strike through occurs change daily.  For buttock wounds: Gently cleanse bilateral buttocks. Pat dry. Place Xeroform gauzes over the bilateral buttock discolored areas, then cover with a foam dressing. Change every 2 days and prn.   02/19/22 1325            Follow-up Information     [facility] Follow up.   Specialty: Home  Health Services Contact information: [place] [address] [phone]         [place] [facility] Follow up.   Contact information: wheel chair transportation call [phone] will need 3  business days notice        [facility] Follow up.   Why: [employer]   providing air overlay mattress, hoyer lift and wheel chair Contact information: [place] [address] [phone]         [facility] Follow up.   Why: providing tube feeding   [phone]        Irine Seal, MD Follow up.   Specialty: Urology Why: office will call you with appointment Contact information: [place] [address] [phone]         [name], DO. Schedule an appointment as soon as possible for a visit in 2 week(s).   Specialty: Family Medicine Why: with CBC and BMP Contact information: [place] [address] [place]. Call.   Specialty: Radiology Why: As needed Contact information: [address] [id number] [place] [phone]        Surgery, [place]. Call.   Specialty: General Surgery Why: As needed Contact information: [address] [place] [address] [phone]                Disposition: {comingfrom:22515} Diet recommendation: {dietplan:22518}  Discharge Exam: Vitals:   02/18/22 1431 02/18/22 2014 02/19/22 0520 02/19/22 0853  BP: 119/64 93/75 (!) 112/54 (!) 111/52  Pulse: 73 83 75 73  Resp: '17 17 17 18  '$ Temp: 97.7 F (36.5 C) 97.6 F (36.4 C) 98.5 F (36.9 C) 98.9 F (37.2 C)  TempSrc: Axillary Oral Oral Oral  SpO2: 98% 94% 96% 100%  Weight:    80.3 kg  Height:       General: Appear in {DEGREE - MILD, MOD, SEV:22033} distress; no visible Abnormal Neck Mass Or lumps, Conjunctiva normal Cardiovascular: S1  and S2 Present, *** Murmur, Respiratory: {Desc; increased/descreased:10091} respiratory effort, Bilateral Air entry present and ***CTA, *** Crackles, *** wheezes Abdomen: Bowel Sound present, *** Extremities: *** Pedal edema Neurology: {EXAM; NEURO ALERTNESS:23097} and {orientationexam:27336} *** Gait not checked due to patient safety concerns Filed Weights   02/14/22 0500 02/15/22 0407 02/19/22 0853  Weight: 83 kg 84.8 kg 80.3 kg   Condition at discharge: stable  The results of significant diagnostics from this hospitalization (including imaging, microbiology, ancillary and laboratory) are listed below for reference.   Imaging Studies: IR GASTR TUBE CONVERT GASTR-JEJ PER W/FL MOD SED  Result Date: 02/10/2022 INDICATION: 88 year old male with wound at prior percutaneous gastrostomy site EXAM: CONVERT G-TUBE TO G-JTUBE MEDICATIONS: None ANESTHESIA/SEDATION: None CONTRAST:  29m OMNIPAQUE IOHEXOL 300 MG/ML SOLN - administered into the gastric lumen. FLUOROSCOPY: Radiation Exposure Index (as provided by the fluoroscopic device): 1572mGy Kerma COMPLICATIONS: None PROCEDURE: Informed written consent was obtained from the patient and the patient's family after a thorough discussion of the procedural risks, benefits and alternatives. All questions were addressed. Maximal Sterile Barrier Technique was utilized including caps, mask, sterile gowns, sterile gloves, sterile drape, hand hygiene and skin antiseptic. A timeout was performed prior to the initiation of the procedure. The epigastrium and indwelling G-tube were prepped with chlorhexidine in a sterile fashion, and a sterile drape was applied covering the operative field. A sterile gown and sterile gloves were used for the procedure. Initial images demonstrated the inflated balloon within the stomach lumen. Contrast was injected to confirm location. The rubber bumper was then loosened and the G-tube was pushed further  into the stomach lumen to create  space for navigation through the pylorus. Combination of a stiff Glidewire and a Kumpe the catheter were then used to navigate in a parallel manner through the ostomy site and the pylorus, into the duodenum. Once the wire and catheter were within the duodenum at the DJ junction, the balloon was deflated on the prior gastrostomy tube and removed from the tract. A 24 French formal gastro jejunostomy tube was modified with amputation of the final 15-20 cm. The feeding tube was then advanced over the stiff Glidewire into position. Balloon was inflated. We inspected the wound, and used a hemostat to identify the location of the residual T tack, which was superficial to the stomach lumen on multiple obliquities. Ultimately we were able to identify that the T tack was exposed at the ostomy site. The T tack was involved with some mesh material and suture material from the prior hernia repair, and in order to remove the conglomerate, sharp excision using scissors was performed to remove the foreign body conglomerate. Contrast was then injected through the lumen of the J tube and gastric tube. Patient tolerated the procedure well and remained hemodynamically stable throughout. No complications were encountered and no significant blood loss encountered. IMPRESSION: Image guided conversion of a gastric tube for a new 24 French gastrojejunostomy. Signed, Dulcy Fanny. Earleen Newport, DO [facility] Radiology Electronically Signed   By: Corrie Mckusick D.O.   On: 02/10/2022 10:49   US RENAL  Result Date: 02/04/2022 CLINICAL DATA:  Hematuria. EXAM: RENAL / URINARY TRACT ULTRASOUND COMPLETE COMPARISON:  Renal ultrasound dated 08/28/2021. CT abdomen pelvis dated 01/24/2022. FINDINGS: Right Kidney: Renal measurements: 12.4 x 5.8 x 5.0 cm = volume: 188 mL. There is mild diffuse parenchyma atrophy. Normal echogenicity. No hydronephrosis or shadowing stone. There is a 6.8 x 4.2 x 6.4 cm upper pole  cyst. Additional cyst seen on the prior CT is poorly visualized on today's exam. Left Kidney: Renal measurements: 11.9 x 6.5 x 5.0 cm = volume: 200 mL. Mild parenchyma atrophy. Normal echogenicity. No hydronephrosis or shadowing stone. There is a 2.6 x 2.3 x 2.5 cm lateral lower pole cyst. Bladder: Small layering debris noted along the posterior bladder wall. Correlation with urinalysis recommended to exclude UTI. Other: None. IMPRESSION: 1. No hydronephrosis or shadowing stone. 2. Bilateral renal cysts. 3. Minimal bladder debris. Correlation with urinalysis recommended to exclude UTI. Electronically Signed   By: Anner Crete M.D.   On: 02/04/2022 19:43   DG Abd Portable 1V  Result Date: 02/02/2022 CLINICAL DATA:  NG tube placement EXAM: PORTABLE ABDOMEN - 1 VIEW COMPARISON:  02/01/2022 FINDINGS: Nonobstructive pattern of bowel gas. Percutaneous gastrostomy tube projects with tip and balloon over the expected gastric body. Enteric feeding tube is positioned with tip in the transverse portion of the duodenum. No obvious free air. IMPRESSION: Enteric feeding tube tip in the transverse portion of the duodenum. Percutaneous gastrostomy tube projects with tip and balloon over the expected gastric body. Electronically Signed   By: Delanna Ahmadi M.D.   On: 02/02/2022 13:10   DG Abd 1 View  Result Date: 02/01/2022 CLINICAL DATA:  NG tube placement. EXAM: ABDOMEN - 1 VIEW COMPARISON:  01/31/2022. FINDINGS: Enteric feeding tube tip projects in the right lower central abdomen, consistent with positioning in the third portion of the duodenum, advanced when compared to the prior study. Normal bowel gas pattern. Percutaneous gastrostomy tube is unchanged. IMPRESSION: 1. Enteric feeding tube tip projects in  the expected location of the third portion of the duodenum. Electronically Signed   By: Lajean Manes M.D.   On: 02/01/2022 14:06   DG Abd Portable 1V  Result Date: 01/31/2022 CLINICAL DATA:  NG tube placement.  EXAM: PORTABLE ABDOMEN - 1 VIEW COMPARISON:  None Available. FINDINGS: No NG tube visible on the current film. The tip of a feeding catheter is positioned at the pylorus, directed towards the duodenum. Gastrostomy tube evident. Bowel gas pattern is nonspecific. IMPRESSION: Feeding tube tip is positioned at the pylorus, directed towards the duodenum. Electronically Signed   By: Misty Stanley M.D.   On: 01/31/2022 17:43   DG Cervical Spine With Flex & Extend  Result Date: 01/27/2022 CLINICAL DATA:  Known C6 fracture.  Neck pain and stiffness. EXAM: CERVICAL SPINE COMPLETE WITH FLEXION AND EXTENSION VIEWS COMPARISON:  Radiograph 01/07/2022, CT 12/10/2021 FINDINGS: AP, lateral neutral, flexion and extension views obtained. The fracture through the anterior inferior aspect of C6 vertebral body and anterior osteophyte is not well seen on the current exam, better delineated on prior CT. Anterolisthesis of C2 on C3 is not seen on neutral, 6 mm on flexion and 3 mm on extension. Anterolisthesis of C3 on C4 is not seen on neutral, 3 mm on flexion and 3 mm on extension. Anterolisthesis of C4 on C5 is 5 mm on neutral, 5 mm on flexion, and 0 mm on extension. Previous anterolisthesis of C5 on C6 is not seen on neutral, flexion, or extension. C6-C7 is normally aligned, but not well assessed on flexion. Multilevel degenerative disc disease and facet hypertrophy, better characterized on prior CT. No new radiographic abnormalities. IMPRESSION: 1. Known subacute fracture through the anterior inferior aspect of C6 vertebral body and anterior osteophyte is not well seen on the current exam, better delineated on prior CT. The C6-C7 disc space normally aligned without abnormal motion on extension, but not well assessed on flexion. 2. Anterolisthesis of C2 on C3, C3 on C4, and C4 on C5 as described. 3. Multilevel degenerative disc disease and facet hypertrophy, better characterized on prior CT. Electronically Signed   By: Keith Rake M.D.   On: 01/27/2022 17:36   DG Abd Portable 1V  Result Date: 01/27/2022 CLINICAL DATA:  NG tube placement. EXAM: PORTABLE ABDOMEN - 1 VIEW COMPARISON:  Abdominal radiograph January 26, 2022. FINDINGS: Enteric tube courses below the diaphragm with the tip probably in the distal stomach or proximal duodenum. Contrast is seen within partially imaged colon. Lower abdomen is not imaged. Lung bases are clear. Multilevel degenerative change of the spine. IMPRESSION: Enteric tube courses below the diaphragm with the tip probably in the distal stomach or proximal duodenum. Electronically Signed   By: [name] M.D.   On: 01/27/2022 15:34   ECHOCARDIOGRAM COMPLETE  Result Date: 01/27/2022    ECHOCARDIOGRAM REPORT   Patient Name:   LITTLETON HAUB Pavich Date of Exam: 01/27/2022 Medical Rec #:  [id number]      Height:       71.0 in Accession #:    [id number]     Weight:       190.3 lb Date of Birth:  [birth year]       BSA:          2.064 m Patient Age:    76 years       BP:           132/63 mmHg Patient Gender: M  HR:           74 bpm. Exam Location:  Inpatient Procedure: 2D Echo, Cardiac Doppler and Color Doppler Indications:    Other abnormalities of the heart R00.8  History:        Patient has prior history of Echocardiogram examinations, most                 recent 04/25/2020. CAD, Prior CABG, COPD and Carotid Disease,                 Arrythmias:Atrial Fibrillation; Risk Factors:Hypertension and                 Diabetes. GERD. AAA.  Sonographer:    Darlina Sicilian RDCS Referring Phys: [phone] A CALDWELL POWELL [place]  1. Left ventricular ejection fraction, by estimation, is 40 to 45%. The left ventricle has mildly decreased function. The left ventricle demonstrates global hypokinesis. Left ventricular diastolic parameters are consistent with Grade I diastolic dysfunction (impaired relaxation).  2. Right ventricular systolic function is normal. The right ventricular size is normal.  3. The mitral  valve is normal in structure. No evidence of mitral valve regurgitation. No evidence of mitral stenosis.  4. The right coronary cusp appears to be restrictive and does not open, the left and noncoronary cusp does open well. The aortic valve is tricuspid. Aortic valve regurgitation is mild to moderate. Suspect there is mild aortic stenosis.  5. There is moderate dilatation of the aortic root, measuring 41 mm. There is moderate dilatation of the ascending aorta, measuring 41 mm.  6. The inferior vena cava is normal in size with greater than 50% respiratory variability, suggesting right atrial pressure of 3 mmHg. FINDINGS  Left Ventricle: Left ventricular ejection fraction, by estimation, is 40 to 45%. The left ventricle has mildly decreased function. The left ventricle demonstrates global hypokinesis. The left ventricular internal cavity size was normal in size. There is  no left ventricular hypertrophy. Abnormal (paradoxical) septal motion, consistent with RV pacemaker. Left ventricular diastolic parameters are consistent with Grade I diastolic dysfunction (impaired relaxation). Right Ventricle: The right ventricular size is normal. No increase in right ventricular wall thickness. Right ventricular systolic function is normal. Left Atrium: Left atrial size was normal in size. Right Atrium: Right atrial size was normal in size. Pericardium: There is no evidence of pericardial effusion. Mitral Valve: The mitral valve is normal in structure. No evidence of mitral valve regurgitation. No evidence of mitral valve stenosis. Tricuspid Valve: The tricuspid valve is normal in structure. Tricuspid valve regurgitation is not demonstrated. No evidence of tricuspid stenosis. Aortic Valve: The right coronary cusp appears to be restrictive and does not open, the left and noncoronary cusp does open well. The aortic valve is tricuspid. Aortic valve regurgitation is mild to moderate. Aortic regurgitation PHT measures 561 msec. Suspect  there is mild aortic stenosis. Pulmonic Valve: The pulmonic valve was not well visualized. Pulmonic valve regurgitation is not visualized. No evidence of pulmonic stenosis. Aorta: There is moderate dilatation of the aortic root, measuring 41 mm. There is moderate dilatation of the ascending aorta, measuring 41 mm. Venous: The inferior vena cava is normal in size with greater than 50% respiratory variability, suggesting right atrial pressure of 3 mmHg. IAS/Shunts: No atrial level shunt detected by color flow Doppler. Additional Comments: A device lead is visualized.  LEFT VENTRICLE PLAX 2D LVIDd:         6.40 cm   Diastology LVIDs:  4.60 cm   LV e' medial:    5.35 cm/s LV PW:         1.20 cm   LV E/e' medial:  12.4 LV IVS:        1.20 cm   LV e' lateral:   5.27 cm/s LVOT diam:     2.80 cm   LV E/e' lateral: 12.6 LV SV:         77 LV SV Index:   37 LVOT Area:     6.16 cm  LEFT ATRIUM             Index LA diam:        3.10 cm 1.50 cm/m LA Vol (A2C):   36.4 ml 17.63 ml/m LA Vol (A4C):   50.8 ml 24.63 ml/m LA Biplane Vol: 40.2 ml 19.47 ml/m  AORTIC VALVE LVOT Vmax:   74.40 cm/s LVOT Vmean:  50.700 cm/s LVOT VTI:    0.125 m AI PHT:      561 msec  AORTA Ao Root diam: 4.10 cm Ao Asc diam:  4.05 cm MITRAL VALVE MV Area (PHT): 3.70 cm    SHUNTS MV Decel Time: 205 msec    Systemic VTI:  0.12 m MV E velocity: 66.40 cm/s  Systemic Diam: 2.80 cm MV A velocity: 96.00 cm/s MV E/A ratio:  0.69 Kardie Tobb DO Electronically signed by Berniece Salines DO Signature Date/Time: 01/27/2022/11:17:13 AM    Final    DG ABDOMEN PEG TUBE LOCATION  Result Date: 01/26/2022 CLINICAL DATA:  88 year old male with feeding tube dysfunction. Peg tube contrast injection. EXAM: ABDOMEN - 1 VIEW COMPARISON:  CT Abdomen and Pelvis 01/24/2022. FINDINGS: Portable AP supine view at 0420 hours. Injected contrast via percutaneous gastrostomy tube outlines the gastric fundus, other gastric mucosa, and proximal duodenum with no abnormal pooling or  extravasation identified. Retained contrast mixed with stool again noted in the right colon. Non obstructed bowel gas pattern. Stable lung bases. Cardiac pacemaker leads. Stable cholecystectomy clips. Bulky spinal endplate osteophytosis. No acute osseous abnormality identified. IMPRESSION: No extravasation or adverse features identified on contrast injection of gastrostomy tube. Electronically Signed   By: Genevie Ann M.D.   On: 01/26/2022 04:47   IR Replc Gastro/Colonic Tube Percut W/Fluoro  Result Date: 01/25/2022 INDICATION: Leaking gastrostomy tube. Concern for abscess surrounding subcutaneous track of gastrostomy tube Request made for fluoroscopic guided gastrostomy tube exchange with potential up sizing. EXAM: FLUOROSCOPIC GUIDED REPLACEMENT OF GASTROSTOMY TUBE COMPARISON:  Image guided percutaneous gastrostomy tube placement-01/15/2022; CT abdomen pelvis-01/24/2022 MEDICATIONS: None. CONTRAST:  42m OMNIPAQUE IOHEXOL 300 MG/ML SOLN - administered into the gastric lumen FLUOROSCOPY TIME:  1 minute, 6 seconds (10 mGy) COMPLICATIONS: None immediate. PROCEDURE: A timeout was performed prior to the initiation of the procedure. The upper abdomen and external portion of the existing gastrostomy tube was prepped and draped in the usual sterile fashion, and a sterile drape was applied covering the operative field. Maximum barrier sterile technique with sterile gowns and gloves were used for the procedure. A timeout was performed prior to the initiation of the procedure. The existing gastrostomy tube was injected with a small amount of contrast confirming appropriate positioning within the gastric lumen. The external portion of the gastrostomy tube was trimmed and cannulated with a short Amplatz wire was coiled within the gastric lumen to the level of the gastric fundus. Next, utilizing gentle traction, the disc retention gastrostomy tube was removed intact. Next, the track was serially dilated ultimately allowing  placement of a  new 24 French balloon inflatable gastrostomy tube. The balloon was inflated with approximately 20 cc of saline and dilute contrast and pulled against the anterior interval of the stomach. The external disc was gently cinched at the skin entrance surface site. Contrast injection demonstrated appropriate positioning and functionality of the new, slightly larger gastrostomy tube. Next, time was spent debriding the apparent pressure ulcer about the medial aspect of the gastrostomy tube entrance site with expression of a moderate amount of purulent fluid undermining the adjacent subcutaneous tissues. Dressings were applied. The patient tolerated the procedure well without immediate postprocedural complication. IMPRESSION: Successful fluoroscopic guided exchange and up sizing of now 24 French gastrostomy tube. The gastrostomy tube is ready for immediate use. Given apparent pressure ulcer adjacent to the medial aspect of the gastrostomy tube insertion site, wound care consultation may be performed as indicated. Electronically Signed   By: Sandi Mariscal M.D.   On: 01/25/2022 14:19   CT ABDOMEN PELVIS W CONTRAST  Result Date: 01/24/2022 CLINICAL DATA:  Abdominal pain and distention purulent discharge at the site of gastrostomy placement EXAM: CT ABDOMEN AND PELVIS WITH CONTRAST TECHNIQUE: Multidetector CT imaging of the abdomen and pelvis was performed using the standard protocol following bolus administration of intravenous contrast. RADIATION DOSE REDUCTION: This exam was performed according to the departmental dose-optimization program which includes automated exposure control, adjustment of the mA and/or kV according to patient size and/or use of iterative reconstruction technique. CONTRAST:  126m OMNIPAQUE IOHEXOL 300 MG/ML  SOLN COMPARISON:  12/11/2021 FINDINGS: Lower chest: Heart is enlarged in size. Coronary artery calcifications are seen. There are metallic sutures in the sternum. Pacemaker leads  are noted in the heart. Hepatobiliary: No focal abnormality is seen in the liver. There is slight prominence of intrahepatic bile ducts. Surgical clips are seen in gallbladder fossa. There is no significant dilation of extrahepatic bile ducts. Pancreas: No focal abnormality is seen. Spleen: Spleen measures 15.9 cm in maximum diameter. Adrenals/Urinary Tract: Adrenals are unremarkable. There is no hydronephrosis. Bilateral renal cysts are seen. Largest of the cysts is seen in the anterior upper pole of right kidney measuring 7.1 cm. Some of the low-density lesions are subcentimeter in size limiting characterization. There are no renal or ureteral stones. Foley catheter is seen in the bladder. There is subtle increased density in the dependent portion of urinary bladder. Stomach/Bowel: Small hiatal hernia is seen. Stomach is not distended. There is interval placement of gastrostomy. Balloon in the gastrostomy tube is noted projecting in the anterior margin of the gastric wall. There is mild stranding in the perigastric region at the site of gastrostomy. There is 6.4 x 2.2 cm area of inflammatory phlegmon in the subcutaneous plane at the site of gastrostomy placement. There are small pockets of air in the subcutaneous plane at the site of gastrostomy. This may be due to open wound in the skin or suggest infectious process in the subcutaneous plane. There is no definite demonstrable thick-walled fluid collection. Small bowel loops are not dilated. Appendix is not dilated. There is no significant wall thickening in colon. Vascular/Lymphatic: There are scattered arterial calcifications. There is seen low-density in the infrarenal abdominal aorta, residual change from aortobifemoral bypass graft. The stent appears to be patent. There is ectasia of left common iliac artery measuring 2.1 cm in the AP diameter. Reproductiveprostate is not enlarged. Please correlate for possible previous intervention in the prostate. Other:  There is no ascites or pneumoperitoneum. There is ventral hernia containing fat to the left of  midline above the level of umbilicus. Bilateral inguinal hernias containing fat are seen, larger on the right side. Musculoskeletal: Degenerative changes are noted in the lumbar spine. Healing fractures are seen in the right ala of sacrum posterior right iliac bone close to the SI joint right superior pubic ramus and right inferior pubic ramus. Mixed attenuation seen in the body of T12 vertebra as not changed. IMPRESSION: There is 6.4 x 2.2 cm area of inflammatory/infectious phlegmon in the subcutaneous plane at the site of placement of gastrostomy. There are pockets of air in the subcutaneous plane at the site gastrostomy tube placement. This may be due to open wound in the skin or suggest infection with gas producing organisms. There is no definite demonstrable thick-walled loculated fluid collection at the site of gastrostomy tube placement. Balloon in the tip of gastrostomy tube is projecting immediately anterior to the anterior wall of the stomach. Possibility of inflated balloon in the gastrostomy tube to be lying immediately anterior to the gastric wall is not excluded. There is no evidence of pneumoperitoneum or significant ascites. There is no evidence of intestinal obstruction or pneumoperitoneum. Appendix is not dilated. There is no hydronephrosis. Appendix is not dilated. Small hiatal hernia. Coronary artery disease. Bilateral renal cysts. Healing fractures are seen in the right ala of sacrum, right iliac bone, right superior pubic ramus and right inferior pubic ramus. Other findings as described in the body of the report. Electronically Signed   By: Elmer Picker M.D.   On: 01/24/2022 17:26   DG Chest Portable 1 View  Result Date: 01/24/2022 CLINICAL DATA:  chest pain, drainage around peg site EXAM: PORTABLE CHEST 1 VIEW COMPARISON:  Chest radiograph Jan 06, 2022. FINDINGS: Low lung volumes. No  consolidation. No visible pleural effusions or pneumothorax. Left subclavian approach cardiac rhythm maintenance device in similar position. CABG and median sternotomy. Similar mild enlargement the cardiac silhouette. No displaced fracture identified. IMPRESSION: No evidence of acute cardiopulmonary disease. Electronically Signed   By: [name] M.D.   On: 01/24/2022 14:49    Microbiology: Results for orders placed or performed during the hospital encounter of 01/24/22  Urine Culture     Status: Abnormal   Collection Time: 01/24/22  3:12 PM   Specimen: Urine, Clean Catch  Result Value Ref Range Status   Specimen Description URINE, CLEAN CATCH  Final   Special Requests   Final    NONE Performed at [facility], [place] [address] [place], [place] [address]    Culture (A)  Final    40,000 COLONIES/mL ENTEROCOCCUS FAECIUM VANCOMYCIN RESISTANT ENTEROCOCCUS ISOLATED    Report Status 01/27/2022 FINAL  Final   Organism ID, Bacteria ENTEROCOCCUS FAECIUM (A)  Final      Susceptibility   Enterococcus faecium - MIC*    AMPICILLIN >=32 RESISTANT Resistant     NITROFURANTOIN 64 INTERMEDIATE Intermediate     VANCOMYCIN >=32 RESISTANT Resistant     LINEZOLID 2 SENSITIVE Sensitive     * 40,000 COLONIES/mL ENTEROCOCCUS FAECIUM  Blood culture (routine x 2)     Status: None   Collection Time: 01/24/22  3:24 PM   Specimen: BLOOD LEFT HAND  Result Value Ref Range Status   Specimen Description BLOOD LEFT HAND  Final   Special Requests   Final    BOTTLES DRAWN AEROBIC AND ANAEROBIC Blood Culture adequate volume   Culture   Final    NO GROWTH 5 DAYS Performed at [facility], [place] [address],  [place], [place] [address]    Report Status 01/29/2022 FINAL  Final  Blood culture (routine x 2)     Status: None   Collection Time: 01/24/22  3:24 PM   Specimen: BLOOD RIGHT HAND  Result Value Ref Range Status   Specimen Description BLOOD RIGHT HAND  Final   Special Requests   Final     BOTTLES DRAWN AEROBIC AND ANAEROBIC Blood Culture adequate volume   Culture   Final    NO GROWTH 5 DAYS Performed at [facility], [place] [address], [place], [place] [address]    Report Status 01/29/2022 FINAL  Final  Gastrointestinal Panel by PCR , Stool     Status: None   Collection Time: 01/24/22  9:08 PM   Specimen: Peg Site; Stool  Result Value Ref Range Status   Campylobacter species NOT DETECTED NOT DETECTED Final   Plesimonas shigelloides NOT DETECTED NOT DETECTED Final   Salmonella species NOT DETECTED NOT DETECTED Final   Yersinia enterocolitica NOT DETECTED NOT DETECTED Final   Vibrio species NOT DETECTED NOT DETECTED Final   Vibrio cholerae NOT DETECTED NOT DETECTED Final   Enteroaggregative E coli (EAEC) NOT DETECTED NOT DETECTED Final   Enteropathogenic E coli (EPEC) NOT DETECTED NOT DETECTED Final   Enterotoxigenic E coli (ETEC) NOT DETECTED NOT DETECTED Final   Shiga like toxin producing E coli (STEC) NOT DETECTED NOT DETECTED Final   Shigella/Enteroinvasive E coli (EIEC) NOT DETECTED NOT DETECTED Final   Cryptosporidium NOT DETECTED NOT DETECTED Final   Cyclospora cayetanensis NOT DETECTED NOT DETECTED Final   Entamoeba histolytica NOT DETECTED NOT DETECTED Final   Giardia lamblia NOT DETECTED NOT DETECTED Final   Adenovirus F40/41 NOT DETECTED NOT DETECTED Final   Astrovirus NOT DETECTED NOT DETECTED Final   Norovirus GI/GII NOT DETECTED NOT DETECTED Final   Rotavirus A NOT DETECTED NOT DETECTED Final   Sapovirus (I, II, IV, and V) NOT DETECTED NOT DETECTED Final    Comment: Performed at [facility], [place] [place], [place] [address]  C Difficile Quick Screen w PCR reflex     Status: None   Collection Time: 01/24/22  9:08 PM   Specimen: Peg Site; Stool  Result Value Ref Range Status   C Diff antigen NEGATIVE NEGATIVE Final   C Diff toxin NEGATIVE NEGATIVE Final   C Diff interpretation No C. difficile detected.  Final    Comment:  Performed at [facility], [place] [address], [place], [place] [address]  Aerobic Culture w Gram Stain (superficial specimen)     Status: None   Collection Time: 01/24/22 10:07 PM   Specimen: Wound  Result Value Ref Range Status   Specimen Description WOUND  Final   Special Requests PEG SITE  Final   Gram Stain   Final    NO SQUAMOUS EPITHELIAL CELLS SEEN FEW WBC SEEN FEW GRAM POSITIVE COCCI ABUNDANT GRAM NEGATIVE RODS Performed at [facility], [address] [place], [place] [address]    Culture   Final    MODERATE STAPHYLOCOCCUS EPIDERMIDIS MODERATE CANDIDA ALBICANS    Report Status 01/27/2022 FINAL  Final   Organism ID, Bacteria STAPHYLOCOCCUS EPIDERMIDIS  Final      Susceptibility   Staphylococcus epidermidis - MIC*    CIPROFLOXACIN >=8 RESISTANT Resistant     ERYTHROMYCIN <=0.25 SENSITIVE Sensitive     GENTAMICIN <=0.5 SENSITIVE Sensitive     OXACILLIN RESISTANT Resistant     TETRACYCLINE <=1 SENSITIVE Sensitive  VANCOMYCIN 2 SENSITIVE Sensitive     TRIMETH/SULFA 20 SENSITIVE Sensitive     CLINDAMYCIN >=8 RESISTANT Resistant     RIFAMPIN <=0.5 SENSITIVE Sensitive     Inducible Clindamycin NEGATIVE Sensitive     * MODERATE STAPHYLOCOCCUS EPIDERMIDIS  Urine Culture     Status: Abnormal   Collection Time: 02/05/22  8:26 AM   Specimen: Urine, Clean Catch  Result Value Ref Range Status   Specimen Description URINE, CLEAN CATCH  Final   Special Requests   Final    NONE Performed at [facility], [place] [address], [place], [place] [address]    Culture (A)  Final    40,000 COLONIES/mL ENTEROCOCCUS FAECIUM VANCOMYCIN RESISTANT ENTEROCOCCUS ISOLATED    Report Status 02/07/2022 FINAL  Final   Organism ID, Bacteria ENTEROCOCCUS FAECIUM (A)  Final      Susceptibility   Enterococcus faecium - MIC*    AMPICILLIN >=32 RESISTANT Resistant     NITROFURANTOIN 64 INTERMEDIATE Intermediate     VANCOMYCIN >=32 RESISTANT Resistant     LINEZOLID 2 SENSITIVE  Sensitive     * 40,000 COLONIES/mL ENTEROCOCCUS FAECIUM    Labs: CBC: Recent Labs  Lab 02/14/22 0858 02/15/22 0102 02/16/22 0151 02/17/22 0056 02/19/22 0145  WBC 3.6* 3.4* 4.1 3.5* 3.6*  HGB 8.5* 7.3* 7.5* 7.6* 7.8*  HCT 27.0* 24.3* 24.1* 24.3* 25.7*  MCV 101.5* 101.3* 102.1* 100.8* 103.2*  PLT 101* 97* 91* 86* 76*   Basic Metabolic Panel: Recent Labs  Lab 02/14/22 0858 02/15/22 0102 02/16/22 0151 02/17/22 0056 02/19/22 0145  NA 136 137 136 131* 138  K 4.3 4.1 4.3 4.4 4.4  CL 108 109 107 103 105  CO2 '24 22 23 23 25  '$ GLUCOSE 150* 160* 136* 152* 127*  BUN 27* 31* 34* 36* 44*  CREATININE 0.66 0.56* 0.63 0.61 0.68  CALCIUM 8.7* 8.5* 8.6* 8.6* 9.0   Liver Function Tests: No results for input(s): "AST", "ALT", "ALKPHOS", "BILITOT", "PROT", "ALBUMIN" in the last 168 hours. CBG: Recent Labs  Lab 02/18/22 0556 02/18/22 1150 02/18/22 1714 02/19/22 0519 02/19/22 1139  GLUCAP 100* 102* 109* 114* 133*    Discharge time spent: {LESS THAN/GREATER THAN:26388} 30 minutes.  Signed: Berle Mull, MD [facility] 02/19/2022

## 2022-02-20 NOTE — Telephone Encounter (Signed)
Transition Care Management Follow-up Telephone Call Date of discharge and from where: 02/19/22 - cellulitis of abdominal wall - feeding tube problem How have you been since you were released from the hospital? Doing better now Any questions or concerns? Yes - both Famotidine and Protonix were prescribed - should he use both? Before Hospital, he was taking Effexor 1/2 BID, but now only giving 1/2 QHS - should they start giving the whole tab at night? Also, Can [facility] nurse draw his labs in a week to 10 days as ordered or does he need to come in office?  Items Reviewed: Did the pt receive and understand the discharge instructions provided? Yes  Medications obtained and verified? Yes  Other? No  Any new allergies since your discharge? No  Dietary orders reviewed? Yes Do you have support at home? Yes   Home Care and Equipment/Supplies: Were home health services ordered? yes If so, what is the name of the agency? [facility] [phone] Has the agency set up a time to come to the patient's home? yes Were any new equipment or medical supplies ordered?  Yes: Hoyer lift, air overlay mattress, wheelchair from [employer], Oxygen from [employer] and tube feeding supplies from [employer] What is the name of the medical supply agency? Noted above Were you able to get the supplies/equipment? yes Do you have any questions related to the use of the equipment or supplies? No  Functional Questionnaire: (I = Independent and D = Dependent) ADLs: D  Bathing/Dressing- D  Meal Prep- D  Eating- D  Maintaining continence- D  Transferring/Ambulation- D  Managing Meds- D  Follow up appointments reviewed:  PCP Hospital f/u appt confirmed? Yes  Scheduled to see [facility] (as [facility] unavailable on 03/05/22 @ 9:20 - needs to be televisit as he is completely dependent - if this is not okay, call the week before so they can arrange wheelchair transportation assistance [facility] f/u appt confirmed?  No,  waiting for calls back from surgeon and urology   Are transportation arrangements needed?  Yes, if in person - they have the information to arrange this If their condition worsens, is the pt aware to call PCP or go to the Emergency Dept.? Yes Was the patient provided with contact information for the PCP's office or ED? Yes Was to pt encouraged to call back with questions or concerns? Yes

## 2022-02-21 ENCOUNTER — Emergency Department (HOSPITAL_COMMUNITY)
Admission: EM | Admit: 2022-02-21 | Discharge: 2022-02-22 | Disposition: A | Discharge status: Home / Self Care | Payer: Medicare Other | Attending: Interventional Radiology | Admitting: Interventional Radiology

## 2022-02-21 ENCOUNTER — Ambulatory Visit (HOSPITAL_COMMUNITY)
Admission: RE | Admit: 2022-02-21 | Discharge: 2022-02-21 | Disposition: A | Discharge status: Home / Self Care | Payer: Medicare Other | Source: Ambulatory Visit | Attending: Interventional Radiology | Admitting: Interventional Radiology

## 2022-02-21 ENCOUNTER — Emergency Department (HOSPITAL_COMMUNITY): Payer: Medicare Other

## 2022-02-21 ENCOUNTER — Other Ambulatory Visit (HOSPITAL_COMMUNITY): Payer: Self-pay | Admitting: Interventional Radiology

## 2022-02-21 DIAGNOSIS — Z431 Encounter for attention to gastrostomy: Secondary | ICD-10-CM | POA: Diagnosis not present

## 2022-02-21 DIAGNOSIS — Z743 Need for continuous supervision: Secondary | ICD-10-CM | POA: Diagnosis not present

## 2022-02-21 DIAGNOSIS — L03311 Cellulitis of abdominal wall: Secondary | ICD-10-CM | POA: Diagnosis not present

## 2022-02-21 DIAGNOSIS — R531 Weakness: Secondary | ICD-10-CM | POA: Diagnosis not present

## 2022-02-21 DIAGNOSIS — S31104A Unspecified open wound of abdominal wall, left lower quadrant without penetration into peritoneal cavity, initial encounter: Secondary | ICD-10-CM | POA: Diagnosis not present

## 2022-02-21 DIAGNOSIS — Z434 Encounter for attention to other artificial openings of digestive tract: Secondary | ICD-10-CM | POA: Diagnosis not present

## 2022-02-21 DIAGNOSIS — K9423 Gastrostomy malfunction: Secondary | ICD-10-CM | POA: Diagnosis not present

## 2022-02-21 DIAGNOSIS — S12500D Unspecified displaced fracture of sixth cervical vertebra, subsequent encounter for fracture with routine healing: Secondary | ICD-10-CM | POA: Diagnosis not present

## 2022-02-21 DIAGNOSIS — I959 Hypotension, unspecified: Secondary | ICD-10-CM | POA: Diagnosis not present

## 2022-02-21 DIAGNOSIS — Z931 Gastrostomy status: Secondary | ICD-10-CM

## 2022-02-21 DIAGNOSIS — K9429 Other complications of gastrostomy: Secondary | ICD-10-CM | POA: Diagnosis not present

## 2022-02-21 DIAGNOSIS — K9422 Gastrostomy infection: Secondary | ICD-10-CM | POA: Diagnosis not present

## 2022-02-21 DIAGNOSIS — R1310 Dysphagia, unspecified: Secondary | ICD-10-CM | POA: Diagnosis not present

## 2022-02-21 HISTORY — PX: IR REPLC GASTRO/COLONIC TUBE PERCUT W/FLUORO: IMG2333

## 2022-02-21 MED ORDER — IOHEXOL 300 MG/ML  SOLN
100.0000 mL | Freq: Once | INTRAMUSCULAR | Status: AC | PRN
  Administered 2022-02-21: 10 mL

## 2022-02-21 MED ORDER — ONDANSETRON HCL 4 MG/2ML IJ SOLN: 4.0000 mg | Freq: Once | INTRAMUSCULAR | Status: DC

## 2022-02-21 MED ORDER — IOHEXOL 300 MG/ML  SOLN
100.0000 mL | Freq: Once | INTRAMUSCULAR | Status: AC | PRN
  Administered 2022-02-21: 15 mL

## 2022-02-21 NOTE — Procedures (Signed)
Interventional Radiology Procedure Note  Procedure: Gastrostomy tube replacement  Findings: Please refer to procedural dictation for full description. 47 Fr Entuit gastrostomy tube re-placed via established track without complication.  Due to report of no longer using tube for feeds and tolerating diet, J arm was not placed.  Complications: None immediate  Estimated Blood Loss: < 5 Ml  Recommendations: Routine gastrostomy care. Follow up with IR as needed.   Ruthann Cancer, MD

## 2022-02-24 ENCOUNTER — Other Ambulatory Visit (HOSPITAL_COMMUNITY): Payer: Self-pay

## 2022-02-24 DIAGNOSIS — L03311 Cellulitis of abdominal wall: Secondary | ICD-10-CM | POA: Diagnosis not present

## 2022-02-24 DIAGNOSIS — R1310 Dysphagia, unspecified: Secondary | ICD-10-CM | POA: Diagnosis not present

## 2022-02-24 DIAGNOSIS — S31104A Unspecified open wound of abdominal wall, left lower quadrant without penetration into peritoneal cavity, initial encounter: Secondary | ICD-10-CM | POA: Diagnosis not present

## 2022-02-24 DIAGNOSIS — S12500D Unspecified displaced fracture of sixth cervical vertebra, subsequent encounter for fracture with routine healing: Secondary | ICD-10-CM | POA: Diagnosis not present

## 2022-02-24 DIAGNOSIS — K9422 Gastrostomy infection: Secondary | ICD-10-CM | POA: Diagnosis not present

## 2022-02-24 MED ORDER — LINEZOLID 100 MG/5ML PO SUSR
400.0000 mg | Freq: Two times a day (BID) | ORAL | 0 refills | Status: AC
  Filled 2022-02-24: qty 450, 10d supply, fill #0

## 2022-02-26 ENCOUNTER — Other Ambulatory Visit (HOSPITAL_COMMUNITY): Payer: Self-pay

## 2022-02-27 DIAGNOSIS — S12500D Unspecified displaced fracture of sixth cervical vertebra, subsequent encounter for fracture with routine healing: Secondary | ICD-10-CM | POA: Diagnosis not present

## 2022-02-27 DIAGNOSIS — K9422 Gastrostomy infection: Secondary | ICD-10-CM | POA: Diagnosis not present

## 2022-02-27 DIAGNOSIS — S31104A Unspecified open wound of abdominal wall, left lower quadrant without penetration into peritoneal cavity, initial encounter: Secondary | ICD-10-CM | POA: Diagnosis not present

## 2022-02-27 DIAGNOSIS — L03311 Cellulitis of abdominal wall: Secondary | ICD-10-CM | POA: Diagnosis not present

## 2022-02-27 DIAGNOSIS — R1310 Dysphagia, unspecified: Secondary | ICD-10-CM | POA: Diagnosis not present

## 2022-02-28 DIAGNOSIS — L03311 Cellulitis of abdominal wall: Secondary | ICD-10-CM | POA: Diagnosis not present

## 2022-02-28 DIAGNOSIS — R1310 Dysphagia, unspecified: Secondary | ICD-10-CM | POA: Diagnosis not present

## 2022-02-28 DIAGNOSIS — S12500D Unspecified displaced fracture of sixth cervical vertebra, subsequent encounter for fracture with routine healing: Secondary | ICD-10-CM | POA: Diagnosis not present

## 2022-02-28 DIAGNOSIS — S31104A Unspecified open wound of abdominal wall, left lower quadrant without penetration into peritoneal cavity, initial encounter: Secondary | ICD-10-CM | POA: Diagnosis not present

## 2022-02-28 DIAGNOSIS — K9422 Gastrostomy infection: Secondary | ICD-10-CM | POA: Diagnosis not present

## 2022-02-28 DIAGNOSIS — N3001 Acute cystitis with hematuria: Secondary | ICD-10-CM | POA: Diagnosis not present

## 2022-03-03 ENCOUNTER — Telehealth: Payer: Self-pay | Admitting: Family Medicine

## 2022-03-03 DIAGNOSIS — S12500D Unspecified displaced fracture of sixth cervical vertebra, subsequent encounter for fracture with routine healing: Secondary | ICD-10-CM | POA: Diagnosis not present

## 2022-03-03 DIAGNOSIS — L03311 Cellulitis of abdominal wall: Secondary | ICD-10-CM | POA: Diagnosis not present

## 2022-03-03 DIAGNOSIS — K9422 Gastrostomy infection: Secondary | ICD-10-CM | POA: Diagnosis not present

## 2022-03-03 DIAGNOSIS — R1310 Dysphagia, unspecified: Secondary | ICD-10-CM | POA: Diagnosis not present

## 2022-03-03 DIAGNOSIS — S31104A Unspecified open wound of abdominal wall, left lower quadrant without penetration into peritoneal cavity, initial encounter: Secondary | ICD-10-CM | POA: Diagnosis not present

## 2022-03-03 MED ORDER — ONDANSETRON 8 MG PO TBDP: 8.0000 mg | ORAL_TABLET | Freq: Three times a day (TID) | ORAL | 0 refills | Status: DC | PRN

## 2022-03-03 NOTE — Telephone Encounter (Signed)
  Prescription Request  03/03/2022   What is the name of the medication or equipment? ZOFRAN  Have you contacted your pharmacy to request a refill? YES  Which pharmacy would you like this sent to? [facility]  Pts daughter called stating that pt has been in and out of the hospital and is currently out of the hospital but is on antibiotics that the hospital gave him. Says they prescribed him Zofran to help with the nausea that pt gets from taking the antibiotics and will run out of the Zofran tomorrow. Needs refill sent in ASAP.

## 2022-03-03 NOTE — Telephone Encounter (Signed)
Daughter aware meds sent

## 2022-03-04 ENCOUNTER — Ambulatory Visit (INDEPENDENT_AMBULATORY_CARE_PROVIDER_SITE_OTHER): Payer: Medicare Other

## 2022-03-04 DIAGNOSIS — S12500D Unspecified displaced fracture of sixth cervical vertebra, subsequent encounter for fracture with routine healing: Secondary | ICD-10-CM | POA: Diagnosis not present

## 2022-03-04 DIAGNOSIS — H9190 Unspecified hearing loss, unspecified ear: Secondary | ICD-10-CM

## 2022-03-04 DIAGNOSIS — J449 Chronic obstructive pulmonary disease, unspecified: Secondary | ICD-10-CM | POA: Diagnosis not present

## 2022-03-04 DIAGNOSIS — G4733 Obstructive sleep apnea (adult) (pediatric): Secondary | ICD-10-CM

## 2022-03-04 DIAGNOSIS — I480 Paroxysmal atrial fibrillation: Secondary | ICD-10-CM

## 2022-03-04 DIAGNOSIS — S31104A Unspecified open wound of abdominal wall, left lower quadrant without penetration into peritoneal cavity, initial encounter: Secondary | ICD-10-CM

## 2022-03-04 DIAGNOSIS — L03311 Cellulitis of abdominal wall: Secondary | ICD-10-CM

## 2022-03-04 DIAGNOSIS — S32810D Multiple fractures of pelvis with stable disruption of pelvic ring, subsequent encounter for fracture with routine healing: Secondary | ICD-10-CM | POA: Diagnosis not present

## 2022-03-04 DIAGNOSIS — K219 Gastro-esophageal reflux disease without esophagitis: Secondary | ICD-10-CM

## 2022-03-04 DIAGNOSIS — D696 Thrombocytopenia, unspecified: Secondary | ICD-10-CM

## 2022-03-04 DIAGNOSIS — I495 Sick sinus syndrome: Secondary | ICD-10-CM

## 2022-03-04 DIAGNOSIS — E119 Type 2 diabetes mellitus without complications: Secondary | ICD-10-CM | POA: Diagnosis not present

## 2022-03-04 DIAGNOSIS — I5022 Chronic systolic (congestive) heart failure: Secondary | ICD-10-CM | POA: Diagnosis not present

## 2022-03-04 DIAGNOSIS — K9422 Gastrostomy infection: Secondary | ICD-10-CM | POA: Diagnosis not present

## 2022-03-04 DIAGNOSIS — D509 Iron deficiency anemia, unspecified: Secondary | ICD-10-CM

## 2022-03-04 DIAGNOSIS — R1310 Dysphagia, unspecified: Secondary | ICD-10-CM

## 2022-03-04 DIAGNOSIS — I2510 Atherosclerotic heart disease of native coronary artery without angina pectoris: Secondary | ICD-10-CM | POA: Diagnosis not present

## 2022-03-04 DIAGNOSIS — I110 Hypertensive heart disease with heart failure: Secondary | ICD-10-CM

## 2022-03-04 DIAGNOSIS — M19011 Primary osteoarthritis, right shoulder: Secondary | ICD-10-CM

## 2022-03-04 DIAGNOSIS — M19012 Primary osteoarthritis, left shoulder: Secondary | ICD-10-CM

## 2022-03-05 ENCOUNTER — Ambulatory Visit (INDEPENDENT_AMBULATORY_CARE_PROVIDER_SITE_OTHER): Payer: Medicare Other | Admitting: Family Medicine

## 2022-03-05 ENCOUNTER — Encounter: Payer: Self-pay | Admitting: Family Medicine

## 2022-03-05 VITALS — BP 91/58 | HR 86 | Temp 97.6°F | Ht 71.0 in | Wt 177.0 lb

## 2022-03-05 DIAGNOSIS — L03818 Cellulitis of other sites: Secondary | ICD-10-CM

## 2022-03-05 DIAGNOSIS — I69359 Hemiplegia and hemiparesis following cerebral infarction affecting unspecified side: Secondary | ICD-10-CM

## 2022-03-05 DIAGNOSIS — E538 Deficiency of other specified B group vitamins: Secondary | ICD-10-CM

## 2022-03-05 DIAGNOSIS — E785 Hyperlipidemia, unspecified: Secondary | ICD-10-CM

## 2022-03-05 DIAGNOSIS — Z9189 Other specified personal risk factors, not elsewhere classified: Secondary | ICD-10-CM

## 2022-03-05 DIAGNOSIS — E559 Vitamin D deficiency, unspecified: Secondary | ICD-10-CM | POA: Diagnosis not present

## 2022-03-05 DIAGNOSIS — L98492 Non-pressure chronic ulcer of skin of other sites with fat layer exposed: Secondary | ICD-10-CM

## 2022-03-05 DIAGNOSIS — E1169 Type 2 diabetes mellitus with other specified complication: Secondary | ICD-10-CM | POA: Diagnosis not present

## 2022-03-05 DIAGNOSIS — L89156 Pressure-induced deep tissue damage of sacral region: Secondary | ICD-10-CM

## 2022-03-05 DIAGNOSIS — L89153 Pressure ulcer of sacral region, stage 3: Secondary | ICD-10-CM

## 2022-03-05 DIAGNOSIS — R32 Unspecified urinary incontinence: Secondary | ICD-10-CM

## 2022-03-05 MED ORDER — VENLAFAXINE HCL 37.5 MG PO TABS: 18.2500 mg | ORAL_TABLET | Freq: Every day | ORAL | 2 refills | Status: DC

## 2022-03-05 NOTE — Progress Notes (Unsigned)
Subjective:  Patient ID: [name], male    DOB: 15-Jan-1933  Age: 88 y.o. MRN: [id number]  CC: Hospitalization Follow-up   HPI DEV DHONDT presents for fell on 4/18 had three fractures & hip fracture. Hospitalized 6/2 through 6/28.  Went to rehab, lost 50 lb. Readmitted as a result. Left side weaker. Possibly a stroke. Had a  G-tube that led to pressure wound and ended up with a jejunal feeding tube. Now on 15 hours a day feeding. 90 ml Jevity / hr X 15 hours a day and 120 ml water flush q 4 hours. Taking Juven and prosource for protein.   Oriented to person only. States his daughter [name] who is with him and gives the history.   Has a sacral pressure wound now. Needs 15 mipilex dressings for sacrum ordered. Also 60 mepilex border flex for abdominal wound. diapers - extra large, chuck wipes, and medium gloves. Wants suction due to thick secretions  Has nausea, using zofran.      10/15/2021   10:11 AM 08/23/2021    1:09 PM 07/01/2021    3:00 PM  Depression screen PHQ 2/9  Decreased Interest 3 2 2   Down, Depressed, Hopeless 3 2 3   PHQ - 2 Score 6 4 5   Altered sleeping 0 1 0  Tired, decreased energy 3 2 3   Change in appetite 0 0 0  Feeling bad or failure about yourself  2 1 3   Trouble concentrating 0 1 0  Moving slowly or fidgety/restless 0 2 0  Suicidal thoughts 3 0 3  PHQ-9 Score 14 11 14   Difficult doing work/chores Somewhat difficult Somewhat difficult     History [place] has a past medical history of AAA (abdominal aortic aneurysm) ([place]), Arthritis, CAD (coronary artery disease), Carotid artery disease ([place]), COPD with asthma ([place]) (02/21/2014), CVA (cerebral vascular accident) ([place]), Dizziness, Dyslipidemia, Ejection fraction, Fatigue, GERD (gastroesophageal reflux disease), History of blood transfusion (1956), History of kidney stones, HOH (hard of hearing), HTN (hypertension), CABG, Hyperbilirubinemia, Itching, Kidney stones, OSA (obstructive sleep apnea) (12/07/2013),  Paroxysmal atrial fibrillation ([place]), Pneumonia (1940's), Prostate cancer ([place]), SCCA (squamous cell carcinoma) of skin (01/04/2018), Superficial infiltrative basal cell carcinoma (03/12/2015), Thrombocytopenia ([place]), Type II diabetes mellitus ([place]), and Vertigo.   He has a past surgical history that includes Abdominal aortic aneurysm repair (~ 2000); Femoral artery aneurysm repair (~ 2000); Medial partial knee replacement (Bilateral, 2009); Umbilical hernia repair; Incisional hernia repair (09/2002); Hernia repair; Inguinal hernia repair (Left, 08/2004); ERCP w/ metal stent placement (12/2001); Cholecystectomy (12/2001); Cardiac catheterization (07/2011); Coronary artery bypass graft (08/21/2011); Prostate biopsy (~ 2001); left heart catheterization with coronary angiogram (N/A, 08/15/2011); Cardiac catheterization (03/30/2014); cancer removed off right side of face; Insert / replace / remove pacemaker (02/22/2019); PACEMAKER IMPLANT (N/A, 02/22/2019); Cystoscopy/retrograde/ureteroscopy (Bilateral, 07/10/2020); Holmium laser application (Right, 99/83/3825); Cystoscopy with stent placement (Right, 07/10/2020); Lithotripsy (07/10/2020); Cystoscopy with retrograde pyelogram, ureteroscopy and stent placement (Bilateral, 07/12/2021); IR GASTROSTOMY TUBE MOD SED (01/15/2022); IR Replc Gastro/Colonic Tube Percut W/Fluoro (01/25/2022); IR GASTR TUBE CONVERT GASTR-JEJ PER W/FL MOD SED (02/10/2022); IR Replc Gastro/Colonic Tube Percut W/Fluoro (02/21/2022); and IR Replc Gastro/Colonic Tube Percut W/Fluoro (02/21/2022).   His family history includes COPD in his sister; Colon cancer in his brother; Emphysema in his sister; Heart attack in his brother, brother, father, and mother; Heart disease in his sister; Prostate cancer in his brother and brother.He reports that he quit smoking about 23 years ago. His smoking use included cigarettes. He has a 150.00 pack-year  smoking history. He quit smokeless tobacco use about 23 years ago.  His  smokeless tobacco use included chew. He reports that he does not drink alcohol and does not use drugs.    ROS Review of Systems  Constitutional:  Negative for fever.  Respiratory:  Negative for shortness of breath.   Cardiovascular:  Negative for chest pain.  Musculoskeletal:  Negative for arthralgias.  Skin:  Negative for rash.    Objective:  BP (!) 91/58   Pulse 86   Temp 97.6 F (36.4 C)   Ht $R'5\' 11"'NV$  (1.803 m)   Wt 177 lb (80.3 kg)   SpO2 100%   BMI 24.69 kg/m   BP Readings from Last 3 Encounters:  03/05/22 (!) 91/58  02/19/22 (!) 111/52  01/21/22 (!) 130/99    Wt Readings from Last 3 Encounters:  03/05/22 177 lb (80.3 kg)  02/19/22 177 lb (80.3 kg)  01/21/22 182 lb 1.6 oz (82.6 kg)     Physical Exam Vitals reviewed.  Constitutional:      Appearance: He is well-developed.  HENT:     Head: Normocephalic and atraumatic.     Right Ear: External ear normal.     Left Ear: External ear normal.     Mouth/Throat:     Pharynx: No oropharyngeal exudate or posterior oropharyngeal erythema.  Eyes:     Pupils: Pupils are equal, round, and reactive to light.  Cardiovascular:     Rate and Rhythm: Normal rate and regular rhythm.     Heart sounds: No murmur heard. Pulmonary:     Effort: No respiratory distress.     Breath sounds: Normal breath sounds.  Musculoskeletal:     Cervical back: Normal range of motion and neck supple.     Comments: Wheelchair bound left side seak  Skin:    General: Skin is warm and dry.  Neurological:     Mental Status: He is alert. He is disoriented.     Coordination: Coordination abnormal.       Assessment & Plan:   Gregory was seen today for hospitalization follow-up.  Diagnoses and all orders for this visit:  Cellulitis of other specified site  B12 deficiency -     CBC with Differential/Platelet -     CMP14+EGFR -     Magnesium -     Phosphorus -     Vitamin B12  Hyperlipidemia associated with type 2 diabetes mellitus  (HCC) -     CBC with Differential/Platelet -     CMP14+EGFR -     Magnesium -     Phosphorus  Pressure injury of sacral region, stage 3 (HCC) -     CBC with Differential/Platelet -     CMP14+EGFR -     Magnesium -     Phosphorus -     For home use only DME Other see comment  CVA, old, hemiparesis ([place]) -     CBC with Differential/Platelet -     CMP14+EGFR -     Magnesium -     Phosphorus  Vitamin D deficiency -     VITAMIN D 25 Hydroxy (Vit-D Deficiency, Fractures)  Pressure injury of deep tissue of sacral region  Abdominal wall skin ulcer, with fat layer exposed ([place]) -     For home use only DME Other see comment  Urinary incontinence, unspecified type -     For home use only DME Other see comment  At high risk for aspiration -  For home use only DME Other see comment  Other orders -     venlafaxine (EFFEXOR) 37.5 MG tablet; Place 0.5 tablets (18.75 mg total) into feeding tube at bedtime.       I have discontinued Cordarious D. Lindseth's mirtazapine. I have also changed his venlafaxine. Additionally, I am having him maintain his onetouch ultrasoft, albuterol, budesonide-formoterol, meclizine, OneTouch Verio, metFORMIN, azelastine, fluticasone, bisacodyl, acetaminophen, amiodarone, apixaban, alum & mag hydroxide-simeth, pantoprazole, rosuvastatin, diphenoxylate-atropine, doxazosin, famotidine, nutrition supplement (JUVEN), feeding supplement (PROSource TF), free water, Zinc Oxide, Spacer/Aero-Hold Chamber Bags, feeding supplement (JEVITY 1.5 CAL/FIBER), fluconazole, linezolid, and ondansetron.  Allergies as of 03/05/2022       Reactions   Penicillins Other (See Comments)   Unknown reaction -- Tolerated Augmentin courses 2019, 2023; Tolerates cephalosporins    Tramadol Other (See Comments)   Dizzy   Ketorolac Tromethamine Rash        Medication List        Accurate as of March 05, 2022  8:37 PM. If you have any questions, ask your nurse or doctor.           STOP taking these medications    mirtazapine 7.5 MG tablet Commonly known as: REMERON Stopped by: BOOTHE, JAMIE B, LPN       TAKE these medications    acetaminophen 160 MG/5ML solution Commonly known as: TYLENOL Place 31.3 mLs (1,000 mg total) into feeding tube every 6 (six) hours. What changed:  how much to take additional instructions   albuterol 108 (90 Base) MCG/ACT inhaler Commonly known as: VENTOLIN HFA Inhale 2 puffs into the lungs every 6 (six) hours as needed for wheezing or shortness of breath.   alum & mag hydroxide-simeth 200-200-20 MG/5ML suspension Commonly known as: MAALOX/MYLANTA Place 30 mLs into feeding tube every 4 (four) hours as needed for indigestion, heartburn or flatulence.   amiodarone 200 MG tablet Commonly known as: PACERONE Place 1 tablet (200 mg total) into feeding tube daily.   apixaban 5 MG Tabs tablet Commonly known as: Eliquis Place 1 tablet (5 mg total) into feeding tube 2 (two) times daily.   azelastine 0.1 % nasal spray Commonly known as: ASTELIN Place 1 spray into both nostrils 2 (two) times daily.   bisacodyl 10 MG suppository Commonly known as: DULCOLAX Place 1 suppository (10 mg total) rectally daily as needed for moderate constipation.   budesonide-formoterol 160-4.5 MCG/ACT inhaler Commonly known as: SYMBICORT Inhale 2 puffs into the lungs 2 (two) times daily.   diphenoxylate-atropine 2.5-0.025 MG/5ML liquid Commonly known as: LOMOTIL Place 5 mLs into feeding tube 4 (four) times daily as needed for diarrhea or loose stools.   doxazosin 1 MG tablet Commonly known as: CARDURA Place 1 tablet (1 mg total) into feeding tube daily.   famotidine 20 MG tablet Commonly known as: PEPCID Place 1 tablet (20 mg total) into feeding tube 2 (two) times daily.   fluconazole 100 MG tablet Commonly known as: Diflucan Place 2 tablets (200 mg total) into feeding tube daily for 1 day, THEN 1 tablet (100 mg total) daily for 14  days. Start taking on: February 19, 2022   fluticasone 50 MCG/ACT nasal spray Commonly known as: FLONASE Place 2 sprays into both nostrils daily as needed for allergies or rhinitis.   free water Soln Place 100 mLs into feeding tube every 4 (four) hours.   linezolid 100 MG/5ML suspension Commonly known as: ZYVOX 20 mLs (400 mg total) by Per J Tube route 2 (two) times daily for  10 days. Discard remainder.   meclizine 25 MG tablet Commonly known as: ANTIVERT Take 1 tablet (25 mg total) by mouth 3 (three) times daily as needed for dizziness.   metFORMIN 500 MG tablet Commonly known as: GLUCOPHAGE Take 1 tablet (500 mg total) by mouth daily with breakfast. What changed: how to take this   nutrition supplement (JUVEN) Pack Place 1 packet into feeding tube 2 (two) times daily between meals.   feeding supplement (JEVITY 1.5 CAL/FIBER) Liqd Place 1,000 mLs into feeding tube daily. At 55 ml/hour   feeding supplement (PROSource TF) liquid Place 45 mLs into feeding tube daily.   ondansetron 8 MG disintegrating tablet Commonly known as: ZOFRAN-ODT Take 1 tablet (8 mg total) by mouth every 8 (eight) hours as needed for nausea or vomiting.   onetouch ultrasoft lancets Use to check blood sugars daily   OneTouch Verio test strip Generic drug: glucose blood TEST BLOOD SUGARS DAILY DX E11.9   pantoprazole 20 MG tablet Commonly known as: PROTONIX 40 mg every morning. Per tube   rosuvastatin 40 MG tablet Commonly known as: CRESTOR TAKE 1 TABLET BY MOUTH  DAILY   Spacer/Aero-Hold Chamber Bags Misc 1 each by Does not apply route as needed.   venlafaxine 37.5 MG tablet Commonly known as: EFFEXOR Place 0.5 tablets (18.75 mg total) into feeding tube at bedtime. What changed: when to take this Changed by: BOOTHE, JAMIE B, LPN   Zinc Oxide 98.3 % ointment Commonly known as: TRIPLE PASTE Apply topically 2 (two) times daily.               Durable Medical Equipment  (From  admission, onward)           Start     Ordered   03/05/22 0000  For home use only DME Other see comment       Comments: Mipilex dressing border flex  6X6 inch #60, 5 refill Mipilex sacral dressing , # 15, 5 refill Diaper, male adult, extra large, #150, 5 refill Chucks pads, # 100, 5 refill Nitrile gloves, 1 box, 5 refill  Question:  Length of Need  Answer:  Lifetime   03/05/22 2034   03/05/22 0000  For home use only DME Other see comment       Comments: Suction device Dx: Z91.89  Question:  Length of Need  Answer:  Lifetime   03/05/22 2037             Follow-up: Return in about 2 weeks (around 03/19/2022), or if symptoms worsen or fail to improve.  Claretta Fraise, M.D.

## 2022-03-05 NOTE — Progress Notes (Unsigned)
Subjective:  Patient ID: [name], male    DOB: 12/25/1932  Age: 88 y.o. MRN: [id number]  CC: Hospitalization Follow-up   HPI [name] presents for fell on 4/18 had three fractures & hip fracture. Hospitalized 6/2 through 6/28.  Went to rehab, lost 50 lb. Readmitted as a result. Left side weaker. Possibly a stroke. Had a  G-tube that led to pressure wound and ended up with a jejunal feeding tube. Now on 15 hours a day feeding. 90 ml Jevity / hr X 15 hours a day and 120 ml water flush q 4 hours. Taking Juven and prosource for protein.   Oriented to person only. States his daughter [name] who is with him and gives the history.   Has a sacral pressure wound now. Needs 15 mipilex dressings for sacrum ordered. Also 60 mepilex border flex for abdominal wound. diapers - extra large, chuck wipes, and medium gloves. Wants suction due to thick secretions  Has nausea, using zofran.      10/15/2021   10:11 AM 08/23/2021    1:09 PM 07/01/2021    3:00 PM  Depression screen PHQ 2/9  Decreased Interest 3 2 2   Down, Depressed, Hopeless 3 2 3   PHQ - 2 Score 6 4 5   Altered sleeping 0 1 0  Tired, decreased energy 3 2 3   Change in appetite 0 0 0  Feeling bad or failure about yourself  2 1 3   Trouble concentrating 0 1 0  Moving slowly or fidgety/restless 0 2 0  Suicidal thoughts 3 0 3  PHQ-9 Score 14 11 14   Difficult doing work/chores Somewhat difficult Somewhat difficult     History [name] has a past medical history of AAA (abdominal aortic aneurysm) ([place]), Arthritis, CAD (coronary artery disease), Carotid artery disease ([place]), COPD with asthma ([place]) (02/21/2014), CVA (cerebral vascular accident) ([place]), Dizziness, Dyslipidemia, Ejection fraction, Fatigue, GERD (gastroesophageal reflux disease), History of blood transfusion (1956), History of kidney stones, HOH (hard of hearing), HTN (hypertension), CABG, Hyperbilirubinemia, Itching, Kidney stones, OSA (obstructive sleep apnea) (12/07/2013),  Paroxysmal atrial fibrillation ([place]), Pneumonia (1940's), Prostate cancer ([employer]), SCCA (squamous cell carcinoma) of skin (01/04/2018), Superficial infiltrative basal cell carcinoma (03/12/2015), Thrombocytopenia ([place]), Type II diabetes mellitus ([place]), and Vertigo.   He has a past surgical history that includes Abdominal aortic aneurysm repair (~ 2000); Femoral artery aneurysm repair (~ 2000); Medial partial knee replacement (Bilateral, 2009); Umbilical hernia repair; Incisional hernia repair (09/2002); Hernia repair; Inguinal hernia repair (Left, 08/2004); ERCP w/ metal stent placement (12/2001); Cholecystectomy (12/2001); Cardiac catheterization (07/2011); Coronary artery bypass graft (08/21/2011); Prostate biopsy (~ 2001); left heart catheterization with coronary angiogram (N/A, 08/15/2011); Cardiac catheterization (03/30/2014); cancer removed off right side of face; Insert / replace / remove pacemaker (02/22/2019); PACEMAKER IMPLANT (N/A, 02/22/2019); Cystoscopy/retrograde/ureteroscopy (Bilateral, 07/10/2020); Holmium laser application (Right, 23/55/7322); Cystoscopy with stent placement (Right, 07/10/2020); Lithotripsy (07/10/2020); Cystoscopy with retrograde pyelogram, ureteroscopy and stent placement (Bilateral, 07/12/2021); IR GASTROSTOMY TUBE MOD SED (01/15/2022); IR Replc Gastro/Colonic Tube Percut W/Fluoro (01/25/2022); IR GASTR TUBE CONVERT GASTR-JEJ PER W/FL MOD SED (02/10/2022); IR Replc Gastro/Colonic Tube Percut W/Fluoro (02/21/2022); and IR Replc Gastro/Colonic Tube Percut W/Fluoro (02/21/2022).   His family history includes COPD in his sister; Colon cancer in his brother; Emphysema in his sister; Heart attack in his brother, brother, father, and mother; Heart disease in his sister; Prostate cancer in his brother and brother.He reports that he quit smoking about 23 years ago. His smoking use included cigarettes. He has a 150.00 pack-year  smoking history. He quit smokeless tobacco use about 23 years ago.  His  smokeless tobacco use included chew. He reports that he does not drink alcohol and does not use drugs.    ROS Review of Systems  Constitutional:  Negative for fever.  Respiratory:  Negative for shortness of breath.   Cardiovascular:  Negative for chest pain.  Musculoskeletal:  Negative for arthralgias.  Skin:  Negative for rash.    Objective:  BP (!) 91/58   Pulse 86   Temp 97.6 F (36.4 C)   Ht $R'5\' 11"'XD$  (1.803 m)   Wt 177 lb (80.3 kg)   SpO2 100%   BMI 24.69 kg/m   BP Readings from Last 3 Encounters:  03/05/22 (!) 91/58  02/19/22 (!) 111/52  01/21/22 (!) 130/99    Wt Readings from Last 3 Encounters:  03/05/22 177 lb (80.3 kg)  02/19/22 177 lb (80.3 kg)  01/21/22 182 lb 1.6 oz (82.6 kg)     Physical Exam Vitals reviewed.  Constitutional:      Appearance: He is well-developed.  HENT:     Head: Normocephalic and atraumatic.     Right Ear: External ear normal.     Left Ear: External ear normal.     Mouth/Throat:     Pharynx: No oropharyngeal exudate or posterior oropharyngeal erythema.  Eyes:     Pupils: Pupils are equal, round, and reactive to light.  Cardiovascular:     Rate and Rhythm: Normal rate and regular rhythm.     Heart sounds: No murmur heard. Pulmonary:     Effort: No respiratory distress.     Breath sounds: Normal breath sounds.  Musculoskeletal:     Cervical back: Normal range of motion and neck supple.     Comments: Wheelchair bound left side seak  Skin:    General: Skin is warm and dry.  Neurological:     Mental Status: He is alert. He is disoriented.     Coordination: Coordination abnormal.       Assessment & Plan:   [name] was seen today for hospitalization follow-up.  Diagnoses and all orders for this visit:  Cellulitis of other specified site  B12 deficiency -     CBC with Differential/Platelet -     CMP14+EGFR -     Magnesium -     Phosphorus -     Vitamin B12  Hyperlipidemia associated with type 2 diabetes mellitus  (HCC) -     CBC with Differential/Platelet -     CMP14+EGFR -     Magnesium -     Phosphorus  Pressure injury of sacral region, stage 3 (HCC) -     CBC with Differential/Platelet -     CMP14+EGFR -     Magnesium -     Phosphorus -     For home use only DME Other see comment  CVA, old, hemiparesis ([place]) -     CBC with Differential/Platelet -     CMP14+EGFR -     Magnesium -     Phosphorus  Vitamin D deficiency -     VITAMIN D 25 Hydroxy (Vit-D Deficiency, Fractures)  Pressure injury of deep tissue of sacral region  Abdominal wall skin ulcer, with fat layer exposed ([place]) -     For home use only DME Other see comment  Urinary incontinence, unspecified type -     For home use only DME Other see comment  At high risk for aspiration -  For home use only DME Other see comment  Other orders -     venlafaxine (EFFEXOR) 37.5 MG tablet; Place 0.5 tablets (18.75 mg total) into feeding tube at bedtime.       I have discontinued [name]'s mirtazapine. I have also changed his venlafaxine. Additionally, I am having him maintain his onetouch ultrasoft, albuterol, budesonide-formoterol, meclizine, OneTouch Verio, metFORMIN, azelastine, fluticasone, bisacodyl, acetaminophen, amiodarone, apixaban, alum & mag hydroxide-simeth, pantoprazole, rosuvastatin, diphenoxylate-atropine, doxazosin, famotidine, nutrition supplement (JUVEN), feeding supplement (PROSource TF), free water, Zinc Oxide, Spacer/Aero-Hold Chamber Bags, feeding supplement (JEVITY 1.5 CAL/FIBER), fluconazole, linezolid, and ondansetron.  Allergies as of 03/05/2022       Reactions   Penicillins Other (See Comments)   Unknown reaction -- Tolerated Augmentin courses 2019, 2023; Tolerates cephalosporins    Tramadol Other (See Comments)   Dizzy   Ketorolac Tromethamine Rash        Medication List        Accurate as of March 05, 2022 11:59 PM. If you have any questions, ask your nurse or doctor.           STOP taking these medications    mirtazapine 7.5 MG tablet Commonly known as: REMERON Stopped by: Claretta Fraise, MD       TAKE these medications    acetaminophen 160 MG/5ML solution Commonly known as: TYLENOL Place 31.3 mLs (1,000 mg total) into feeding tube every 6 (six) hours. What changed:  how much to take additional instructions   albuterol 108 (90 Base) MCG/ACT inhaler Commonly known as: VENTOLIN HFA Inhale 2 puffs into the lungs every 6 (six) hours as needed for wheezing or shortness of breath.   alum & mag hydroxide-simeth 200-200-20 MG/5ML suspension Commonly known as: MAALOX/MYLANTA Place 30 mLs into feeding tube every 4 (four) hours as needed for indigestion, heartburn or flatulence.   amiodarone 200 MG tablet Commonly known as: PACERONE Place 1 tablet (200 mg total) into feeding tube daily.   apixaban 5 MG Tabs tablet Commonly known as: Eliquis Place 1 tablet (5 mg total) into feeding tube 2 (two) times daily.   azelastine 0.1 % nasal spray Commonly known as: ASTELIN Place 1 spray into both nostrils 2 (two) times daily.   bisacodyl 10 MG suppository Commonly known as: DULCOLAX Place 1 suppository (10 mg total) rectally daily as needed for moderate constipation.   budesonide-formoterol 160-4.5 MCG/ACT inhaler Commonly known as: SYMBICORT Inhale 2 puffs into the lungs 2 (two) times daily.   diphenoxylate-atropine 2.5-0.025 MG/5ML liquid Commonly known as: LOMOTIL Place 5 mLs into feeding tube 4 (four) times daily as needed for diarrhea or loose stools.   doxazosin 1 MG tablet Commonly known as: CARDURA Place 1 tablet (1 mg total) into feeding tube daily.   famotidine 20 MG tablet Commonly known as: PEPCID Place 1 tablet (20 mg total) into feeding tube 2 (two) times daily.   fluconazole 100 MG tablet Commonly known as: Diflucan Place 2 tablets (200 mg total) into feeding tube daily for 1 day, THEN 1 tablet (100 mg total) daily for 14  days. Start taking on: February 19, 2022   fluticasone 50 MCG/ACT nasal spray Commonly known as: FLONASE Place 2 sprays into both nostrils daily as needed for allergies or rhinitis.   free water Soln Place 100 mLs into feeding tube every 4 (four) hours.   linezolid 100 MG/5ML suspension Commonly known as: ZYVOX 20 mLs (400 mg total) by Per J Tube route 2 (two) times daily for 10 days.  Discard remainder.   meclizine 25 MG tablet Commonly known as: ANTIVERT Take 1 tablet (25 mg total) by mouth 3 (three) times daily as needed for dizziness.   metFORMIN 500 MG tablet Commonly known as: GLUCOPHAGE Take 1 tablet (500 mg total) by mouth daily with breakfast. What changed: how to take this   nutrition supplement (JUVEN) Pack Place 1 packet into feeding tube 2 (two) times daily between meals.   feeding supplement (JEVITY 1.5 CAL/FIBER) Liqd Place 1,000 mLs into feeding tube daily. At 55 ml/hour   feeding supplement (PROSource TF) liquid Place 45 mLs into feeding tube daily.   ondansetron 8 MG disintegrating tablet Commonly known as: ZOFRAN-ODT Take 1 tablet (8 mg total) by mouth every 8 (eight) hours as needed for nausea or vomiting.   onetouch ultrasoft lancets Use to check blood sugars daily   OneTouch Verio test strip Generic drug: glucose blood TEST BLOOD SUGARS DAILY DX E11.9   pantoprazole 20 MG tablet Commonly known as: PROTONIX 40 mg every morning. Per tube   rosuvastatin 40 MG tablet Commonly known as: CRESTOR TAKE 1 TABLET BY MOUTH  DAILY   Spacer/Aero-Hold Chamber Bags Misc 1 each by Does not apply route as needed.   venlafaxine 37.5 MG tablet Commonly known as: EFFEXOR Place 0.5 tablets (18.75 mg total) into feeding tube at bedtime. What changed: when to take this Changed by: Claretta Fraise, MD   Zinc Oxide 12.8 % ointment Commonly known as: TRIPLE PASTE Apply topically 2 (two) times daily.               Durable Medical Equipment  (From  admission, onward)           Start     Ordered   03/05/22 0000  For home use only DME Other see comment       Comments: Mipilex dressing border flex  6X6 inch #60, 5 refill Mipilex sacral dressing , # 15, 5 refill Diaper, male adult, extra large, #150, 5 refill Chucks pads, # 100, 5 refill Nitrile gloves, 1 box, 5 refill  Question:  Length of Need  Answer:  Lifetime   03/05/22 2034   03/05/22 0000  For home use only DME Other see comment       Comments: Suction device Dx: Z91.89  Question:  Length of Need  Answer:  Lifetime   03/05/22 2037             Follow-up: Return in about 2 weeks (around 03/19/2022), or if symptoms worsen or fail to improve.  Claretta Fraise, M.D.

## 2022-03-06 DIAGNOSIS — K9422 Gastrostomy infection: Secondary | ICD-10-CM | POA: Diagnosis not present

## 2022-03-06 DIAGNOSIS — S12500D Unspecified displaced fracture of sixth cervical vertebra, subsequent encounter for fracture with routine healing: Secondary | ICD-10-CM | POA: Diagnosis not present

## 2022-03-06 DIAGNOSIS — S31104A Unspecified open wound of abdominal wall, left lower quadrant without penetration into peritoneal cavity, initial encounter: Secondary | ICD-10-CM | POA: Diagnosis not present

## 2022-03-06 DIAGNOSIS — L03311 Cellulitis of abdominal wall: Secondary | ICD-10-CM | POA: Diagnosis not present

## 2022-03-06 DIAGNOSIS — R1310 Dysphagia, unspecified: Secondary | ICD-10-CM | POA: Diagnosis not present

## 2022-03-06 LAB — CMP14+EGFR
ALT: 50 IU/L — ABNORMAL HIGH (ref 0–44)
AST: 44 IU/L — ABNORMAL HIGH (ref 0–40)
Albumin/Globulin Ratio: 1.5 (ref 1.2–2.2)
Albumin: 3.4 g/dL — ABNORMAL LOW (ref 3.7–4.7)
Alkaline Phosphatase: 94 IU/L (ref 44–121)
BUN/Creatinine Ratio: 87 — ABNORMAL HIGH (ref 10–24)
BUN: 83 mg/dL (ref 8–27)
Bilirubin Total: 0.5 mg/dL (ref 0.0–1.2)
CO2: 26 mmol/L (ref 20–29)
Calcium: 10.2 mg/dL (ref 8.6–10.2)
Chloride: 115 mmol/L — ABNORMAL HIGH (ref 96–106)
Creatinine, Ser: 0.95 mg/dL (ref 0.76–1.27)
Globulin, Total: 2.3 g/dL (ref 1.5–4.5)
Glucose: 169 mg/dL — ABNORMAL HIGH (ref 70–99)
Potassium: 4.9 mmol/L (ref 3.5–5.2)
Sodium: 152 mmol/L — ABNORMAL HIGH (ref 134–144)
Total Protein: 5.7 g/dL — ABNORMAL LOW (ref 6.0–8.5)
eGFR: 77 mL/min/{1.73_m2} (ref >59)

## 2022-03-06 LAB — CBC WITH DIFFERENTIAL/PLATELET
Basophils Absolute: 0 10*3/uL (ref 0.0–0.2)
Basos: 0 %
EOS (ABSOLUTE): 0 10*3/uL (ref 0.0–0.4)
Eos: 0 %
Hematocrit: 26.3 % — ABNORMAL LOW (ref 37.5–51.0)
Hemoglobin: 8 g/dL — CL (ref 13.0–17.7)
Immature Grans (Abs): 0 10*3/uL (ref 0.0–0.1)
Immature Granulocytes: 1 %
Lymphocytes Absolute: 2 10*3/uL (ref 0.7–3.1)
Lymphs: 24 %
MCH: 30.3 pg (ref 26.6–33.0)
MCHC: 30.4 g/dL — ABNORMAL LOW (ref 31.5–35.7)
MCV: 100 fL — ABNORMAL HIGH (ref 79–97)
Monocytes Absolute: 0.5 10*3/uL (ref 0.1–0.9)
Monocytes: 6 %
Neutrophils Absolute: 5.7 10*3/uL (ref 1.4–7.0)
Neutrophils: 69 %
Platelets: 133 10*3/uL — ABNORMAL LOW (ref 150–450)
RBC: 2.64 x10E6/uL — CL (ref 4.14–5.80)
RDW: 18.9 % — ABNORMAL HIGH (ref 11.6–15.4)
WBC: 8.3 10*3/uL (ref 3.4–10.8)

## 2022-03-06 LAB — VITAMIN B12: Vitamin B-12: 492 pg/mL (ref 232–1245)

## 2022-03-06 LAB — MAGNESIUM: Magnesium: 2.5 mg/dL — ABNORMAL HIGH (ref 1.6–2.3)

## 2022-03-06 LAB — VITAMIN D 25 HYDROXY (VIT D DEFICIENCY, FRACTURES): Vit D, 25-Hydroxy: 28.1 ng/mL — ABNORMAL LOW (ref 30.0–100.0)

## 2022-03-06 LAB — PHOSPHORUS: Phosphorus: 3.1 mg/dL (ref 2.8–4.1)

## 2022-03-10 DIAGNOSIS — R338 Other retention of urine: Secondary | ICD-10-CM | POA: Diagnosis not present

## 2022-03-10 DIAGNOSIS — R310 Gross hematuria: Secondary | ICD-10-CM | POA: Diagnosis not present

## 2022-03-11 ENCOUNTER — Telehealth: Payer: Self-pay | Admitting: Family Medicine

## 2022-03-11 DIAGNOSIS — K9422 Gastrostomy infection: Secondary | ICD-10-CM | POA: Diagnosis not present

## 2022-03-11 DIAGNOSIS — S12500D Unspecified displaced fracture of sixth cervical vertebra, subsequent encounter for fracture with routine healing: Secondary | ICD-10-CM | POA: Diagnosis not present

## 2022-03-11 DIAGNOSIS — I69359 Hemiplegia and hemiparesis following cerebral infarction affecting unspecified side: Secondary | ICD-10-CM

## 2022-03-11 DIAGNOSIS — L89153 Pressure ulcer of sacral region, stage 3: Secondary | ICD-10-CM

## 2022-03-11 DIAGNOSIS — S31104A Unspecified open wound of abdominal wall, left lower quadrant without penetration into peritoneal cavity, initial encounter: Secondary | ICD-10-CM | POA: Diagnosis not present

## 2022-03-11 DIAGNOSIS — L03311 Cellulitis of abdominal wall: Secondary | ICD-10-CM | POA: Diagnosis not present

## 2022-03-11 DIAGNOSIS — R1310 Dysphagia, unspecified: Secondary | ICD-10-CM | POA: Diagnosis not present

## 2022-03-11 NOTE — Telephone Encounter (Signed)
Spoke with patient's daughter, [name].  Patient is currently being given 120 cc of water every 4 hours during the times he is doing feedings for 15 hours; and, 100 cc of water every four hours for the 9 hours he is not doing feedings.  They have been advised that his kidneys are showing dehydration and they need to double his intake of water.  She is concerned about doing this because of his history of heart failure and wants to double check to make sure this is correct.  Please advise.

## 2022-03-11 NOTE — Telephone Encounter (Signed)
I recommend proceeding with increased water intake as dehydration can aggravate heart disease as well.  If he shows sudden swelling/ shortness of breath, please seek evaluation.  Will cc his cardiologist as FYI as well.  When is the nutritionist reevaluating him to modify feeds/ etc?

## 2022-03-11 NOTE — Telephone Encounter (Signed)
Pt daughter will return call tomorrow

## 2022-03-11 NOTE — Telephone Encounter (Signed)
Done. Given to alyssa to fax

## 2022-03-12 ENCOUNTER — Telehealth: Payer: Self-pay

## 2022-03-12 DIAGNOSIS — S31104A Unspecified open wound of abdominal wall, left lower quadrant without penetration into peritoneal cavity, initial encounter: Secondary | ICD-10-CM | POA: Diagnosis not present

## 2022-03-12 DIAGNOSIS — K9422 Gastrostomy infection: Secondary | ICD-10-CM | POA: Diagnosis not present

## 2022-03-12 DIAGNOSIS — R1310 Dysphagia, unspecified: Secondary | ICD-10-CM | POA: Diagnosis not present

## 2022-03-12 DIAGNOSIS — I69359 Hemiplegia and hemiparesis following cerebral infarction affecting unspecified side: Secondary | ICD-10-CM | POA: Diagnosis not present

## 2022-03-12 DIAGNOSIS — S12500D Unspecified displaced fracture of sixth cervical vertebra, subsequent encounter for fracture with routine healing: Secondary | ICD-10-CM | POA: Diagnosis not present

## 2022-03-12 DIAGNOSIS — L89153 Pressure ulcer of sacral region, stage 3: Secondary | ICD-10-CM | POA: Diagnosis not present

## 2022-03-12 DIAGNOSIS — L03311 Cellulitis of abdominal wall: Secondary | ICD-10-CM | POA: Diagnosis not present

## 2022-03-12 NOTE — Telephone Encounter (Signed)
Daughter teresa calling to talk to nurse about this message. Please call back

## 2022-03-12 NOTE — Telephone Encounter (Signed)
Spoke to daughter and advised to to call back if they have any questions

## 2022-03-12 NOTE — Telephone Encounter (Signed)
Patient's daughter calling for clarification on water. Would also like to let Dr Lajuana Ripple know that patient has a spot on the roof of his mouth. Wants to send photo but does not have MyChart set up, would he be able to come in to see triage or Dr Lajuana Ripple about this?

## 2022-03-12 NOTE — Telephone Encounter (Signed)
Pt is needed home health nutrionist for his feeding tubes

## 2022-03-12 NOTE — Telephone Encounter (Signed)
Alyssa, please give this family a call again and relay the info from yesterday.  Also, see if we can help the family set up a mychart so this patient can send photos/ do a Mychart visit.

## 2022-03-13 ENCOUNTER — Ambulatory Visit (INDEPENDENT_AMBULATORY_CARE_PROVIDER_SITE_OTHER): Payer: Medicare Other

## 2022-03-13 VITALS — BP 121/49 | HR 95 | Wt 177.0 lb

## 2022-03-13 DIAGNOSIS — Z0000 Encounter for general adult medical examination without abnormal findings: Secondary | ICD-10-CM

## 2022-03-13 DIAGNOSIS — S31104A Unspecified open wound of abdominal wall, left lower quadrant without penetration into peritoneal cavity, initial encounter: Secondary | ICD-10-CM | POA: Diagnosis not present

## 2022-03-13 DIAGNOSIS — K9422 Gastrostomy infection: Secondary | ICD-10-CM | POA: Diagnosis not present

## 2022-03-13 DIAGNOSIS — R1310 Dysphagia, unspecified: Secondary | ICD-10-CM | POA: Diagnosis not present

## 2022-03-13 DIAGNOSIS — S12500D Unspecified displaced fracture of sixth cervical vertebra, subsequent encounter for fracture with routine healing: Secondary | ICD-10-CM | POA: Diagnosis not present

## 2022-03-13 DIAGNOSIS — L03311 Cellulitis of abdominal wall: Secondary | ICD-10-CM | POA: Diagnosis not present

## 2022-03-13 NOTE — Telephone Encounter (Signed)
Home health nurse calling to let us know she saw patient this afternoon and she does not feel is he doing well.  His blood pressure was 100/46, pulse was 96, respirations seem to have increased.  She feels he has worsened since she saw him last week.  She spoke with patient's daughter about palliative care at [facility] but daughter states they are not ready for Hospice.  I asked nurse about a nutritionist from [place] to help with the feeding tube but she said they do not have a nutritionist that she knows of.  She is going to email the speech therapist and see if they can help/guide [place].

## 2022-03-13 NOTE — Patient Instructions (Signed)
[name] , Thank you for taking time to come for your Medicare Wellness Visit. I appreciate your ongoing commitment to your health goals. Please review the following plan we discussed and let me know if I can assist you in the future.   Screening recommendations/referrals: Colonoscopy: Done 10/01/2010 - no repeat required Recommended yearly ophthalmology/optometry visit for glaucoma screening and checkup Recommended yearly dental visit for hygiene and checkup  Vaccinations: Influenza vaccine: Done 04/30/2021 - Repeat annually  Pneumococcal vaccine: Done 10/24/2006 & 12/27/2014  Tdap vaccine: Done 07/19/2019 - Repeat in 10 years  Shingles vaccine: Zostavax done 2014 - Due for Shingrix which is 2 doses 2-6 months apart and over 90% effective     Covid-19: Done  09/17/2019, 10/08/2019, 06/27/2020, 05/08/2020  Advanced directives: in chart  Conditions/risks identified: Aim for 30 minutes of exercise or brisk walking, 6-8 glasses of water, and 5 servings of fruits and vegetables each day.   Next appointment: Follow up in one year for your annual wellness visit.   Preventive Care 86 Years and Older, Male  Preventive care refers to lifestyle choices and visits with your health care provider that can promote health and wellness. What does preventive care include? A yearly physical exam. This is also called an annual well check. Dental exams once or twice a year. Routine eye exams. Ask your health care provider how often you should have your eyes checked. Personal lifestyle choices, including: Daily care of your teeth and gums. Regular physical activity. Eating a healthy diet. Avoiding tobacco and drug use. Limiting alcohol use. Practicing safe sex. Taking low doses of aspirin every day. Taking vitamin and mineral supplements as recommended by your health care provider. What happens during an annual well check? The services and screenings done by your health care provider during your annual well  check will depend on your age, overall health, lifestyle risk factors, and family history of disease. Counseling  Your health care provider may ask you questions about your: Alcohol use. Tobacco use. Drug use. Emotional well-being. Home and relationship well-being. Sexual activity. Eating habits. History of falls. Memory and ability to understand (cognition). Work and work Statistician. Screening  You may have the following tests or measurements: Height, weight, and BMI. Blood pressure. Lipid and cholesterol levels. These may be checked every 5 years, or more frequently if you are over 17 years old. Skin check. Lung cancer screening. You may have this screening every year starting at age 17 if you have a 30-pack-year history of smoking and currently smoke or have quit within the past 15 years. Fecal occult blood test (FOBT) of the stool. You may have this test every year starting at age 29. Flexible sigmoidoscopy or colonoscopy. You may have a sigmoidoscopy every 5 years or a colonoscopy every 10 years starting at age 19. Prostate cancer screening. Recommendations will vary depending on your family history and other risks. Hepatitis C blood test. Hepatitis B blood test. Sexually transmitted disease (STD) testing. Diabetes screening. This is done by checking your blood sugar (glucose) after you have not eaten for a while (fasting). You may have this done every 1-3 years. Abdominal aortic aneurysm (AAA) screening. You may need this if you are a current or former smoker. Osteoporosis. You may be screened starting at age 79 if you are at high risk. Talk with your health care provider about your test results, treatment options, and if necessary, the need for more tests. Vaccines  Your health care provider may recommend certain vaccines, such  as: Influenza vaccine. This is recommended every year. Tetanus, diphtheria, and acellular pertussis (Tdap, Td) vaccine. You may need a Td booster  every 10 years. Zoster vaccine. You may need this after age 55. Pneumococcal 13-valent conjugate (PCV13) vaccine. One dose is recommended after age 31. Pneumococcal polysaccharide (PPSV23) vaccine. One dose is recommended after age 28. Talk to your health care provider about which screenings and vaccines you need and how often you need them. This information is not intended to replace advice given to you by your health care provider. Make sure you discuss any questions you have with your health care provider. Document Released: 09/07/2015 Document Revised: 04/30/2016 Document Reviewed: 06/12/2015 Elsevier Interactive Patient Education  2017 [place] Prevention in the Home Falls can cause injuries. They can happen to people of all ages. There are many things you can do to make your home safe and to help prevent falls. What can I do on the outside of my home? Regularly fix the edges of walkways and driveways and fix any cracks. Remove anything that might make you trip as you walk through a door, such as a raised step or threshold. Trim any bushes or trees on the path to your home. Use bright outdoor lighting. Clear any walking paths of anything that might make someone trip, such as rocks or tools. Regularly check to see if handrails are loose or broken. Make sure that both sides of any steps have handrails. Any raised decks and porches should have guardrails on the edges. Have any leaves, snow, or ice cleared regularly. Use sand or salt on walking paths during winter. Clean up any spills in your garage right away. This includes oil or grease spills. What can I do in the bathroom? Use night lights. Install grab bars by the toilet and in the tub and shower. Do not use towel bars as grab bars. Use non-skid mats or decals in the tub or shower. If you need to sit down in the shower, use a plastic, non-slip stool. Keep the floor dry. Clean up any water that spills on the floor as soon  as it happens. Remove soap buildup in the tub or shower regularly. Attach bath mats securely with double-sided non-slip rug tape. Do not have throw rugs and other things on the floor that can make you trip. What can I do in the bedroom? Use night lights. Make sure that you have a light by your bed that is easy to reach. Do not use any sheets or blankets that are too big for your bed. They should not hang down onto the floor. Have a firm chair that has side arms. You can use this for support while you get dressed. Do not have throw rugs and other things on the floor that can make you trip. What can I do in the kitchen? Clean up any spills right away. Avoid walking on wet floors. Keep items that you use a lot in easy-to-reach places. If you need to reach something above you, use a strong step stool that has a grab bar. Keep electrical cords out of the way. Do not use floor polish or wax that makes floors slippery. If you must use wax, use non-skid floor wax. Do not have throw rugs and other things on the floor that can make you trip. What can I do with my stairs? Do not leave any items on the stairs. Make sure that there are handrails on both sides of the stairs and use  them. Fix handrails that are broken or loose. Make sure that handrails are as long as the stairways. Check any carpeting to make sure that it is firmly attached to the stairs. Fix any carpet that is loose or worn. Avoid having throw rugs at the top or bottom of the stairs. If you do have throw rugs, attach them to the floor with carpet tape. Make sure that you have a light switch at the top of the stairs and the bottom of the stairs. If you do not have them, ask someone to add them for you. What else can I do to help prevent falls? Wear shoes that: Do not have high heels. Have rubber [place]. Are comfortable and fit you well. Are closed at the toe. Do not wear sandals. If you use a stepladder: Make sure that it is fully  opened. Do not climb a closed stepladder. Make sure that both sides of the stepladder are locked into place. Ask someone to hold it for you, if possible. Clearly mark and make sure that you can see: Any grab bars or handrails. First and last steps. Where the edge of each step is. Use tools that help you move around (mobility aids) if they are needed. These include: Canes. Walkers. Scooters. Crutches. Turn on the lights when you go into a dark area. Replace any light bulbs as soon as they burn out. Set up your furniture so you have a clear path. Avoid moving your furniture around. If any of your floors are uneven, fix them. If there are any pets around you, be aware of where they are. Review your medicines with your doctor. Some medicines can make you feel dizzy. This can increase your chance of falling. Ask your doctor what other things that you can do to help prevent falls. This information is not intended to replace advice given to you by your health care provider. Make sure you discuss any questions you have with your health care provider. Document Released: 06/07/2009 Document Revised: 01/17/2016 Document Reviewed: 09/15/2014 Elsevier Interactive Patient Education  2017 [employer].
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
## 2022-03-14 ENCOUNTER — Other Ambulatory Visit: Payer: Self-pay | Admitting: Family Medicine

## 2022-03-14 ENCOUNTER — Ambulatory Visit (INDEPENDENT_AMBULATORY_CARE_PROVIDER_SITE_OTHER): Payer: Medicare Other

## 2022-03-14 DIAGNOSIS — I441 Atrioventricular block, second degree: Secondary | ICD-10-CM | POA: Diagnosis not present

## 2022-03-14 LAB — CUP PACEART REMOTE DEVICE CHECK
Battery Remaining Longevity: 84 mo
Battery Remaining Percentage: 71 %
Battery Voltage: 2.99 V
Brady Statistic AP VP Percent: 35 %
Brady Statistic AP VS Percent: 1 %
Brady Statistic AS VP Percent: 65 %
Brady Statistic AS VS Percent: 1 %
Brady Statistic RA Percent Paced: 35 %
Brady Statistic RV Percent Paced: 99 %
Date Time Interrogation Session: 20230721113529
Implantable Lead Implant Date: 20200630
Implantable Lead Implant Date: 20200630
Implantable Lead Location: 753859
Implantable Lead Location: 753860
Implantable Pulse Generator Implant Date: 20200630
Lead Channel Impedance Value: 450 Ohm
Lead Channel Impedance Value: 490 Ohm
Lead Channel Pacing Threshold Amplitude: 0.5 V
Lead Channel Pacing Threshold Amplitude: 0.75 V
Lead Channel Pacing Threshold Pulse Width: 0.5 ms
Lead Channel Pacing Threshold Pulse Width: 0.5 ms
Lead Channel Sensing Intrinsic Amplitude: 1.5 mV
Lead Channel Sensing Intrinsic Amplitude: 7.6 mV
Lead Channel Setting Pacing Amplitude: 0.75 V
Lead Channel Setting Pacing Amplitude: 2 V
Lead Channel Setting Pacing Pulse Width: 0.5 ms
Lead Channel Setting Sensing Sensitivity: 4 mV
Pulse Gen Model: 2272
Pulse Gen Serial Number: 3311958

## 2022-03-14 NOTE — Telephone Encounter (Signed)
HIGHLY recommend repeat eval in ER if he is declining and they are not ready for hospice care!

## 2022-03-14 NOTE — Telephone Encounter (Signed)
Can we please follow up on those DMEs?

## 2022-03-16 NOTE — Telephone Encounter (Signed)
I wrote and signed these prior to leaving. Please locate or reprint. See where the duaghter wants them sent.

## 2022-03-17 DIAGNOSIS — R1310 Dysphagia, unspecified: Secondary | ICD-10-CM | POA: Diagnosis not present

## 2022-03-17 DIAGNOSIS — K9422 Gastrostomy infection: Secondary | ICD-10-CM | POA: Diagnosis not present

## 2022-03-17 DIAGNOSIS — S12500D Unspecified displaced fracture of sixth cervical vertebra, subsequent encounter for fracture with routine healing: Secondary | ICD-10-CM | POA: Diagnosis not present

## 2022-03-17 DIAGNOSIS — L03311 Cellulitis of abdominal wall: Secondary | ICD-10-CM | POA: Diagnosis not present

## 2022-03-17 DIAGNOSIS — S31104A Unspecified open wound of abdominal wall, left lower quadrant without penetration into peritoneal cavity, initial encounter: Secondary | ICD-10-CM | POA: Diagnosis not present

## 2022-03-17 NOTE — Telephone Encounter (Signed)
Jan faxed 03/14/22

## 2022-03-17 NOTE — Progress Notes (Signed)
I have reviewed the AWV documentation and agree with the written assessment and plan of care.  Aryanne Gilleland, [name]-C [facility]  

## 2022-03-18 ENCOUNTER — Encounter: Payer: Self-pay | Admitting: Family Medicine

## 2022-03-18 ENCOUNTER — Telehealth: Payer: Self-pay | Admitting: Family Medicine

## 2022-03-18 ENCOUNTER — Ambulatory Visit (INDEPENDENT_AMBULATORY_CARE_PROVIDER_SITE_OTHER): Payer: Medicare Other | Admitting: Family Medicine

## 2022-03-18 VITALS — BP 99/63 | HR 91 | Temp 97.4°F | Ht 71.0 in | Wt 177.0 lb

## 2022-03-18 DIAGNOSIS — N3001 Acute cystitis with hematuria: Secondary | ICD-10-CM | POA: Diagnosis not present

## 2022-03-18 DIAGNOSIS — Z9189 Other specified personal risk factors, not elsewhere classified: Secondary | ICD-10-CM | POA: Diagnosis not present

## 2022-03-18 DIAGNOSIS — D649 Anemia, unspecified: Secondary | ICD-10-CM | POA: Diagnosis not present

## 2022-03-18 DIAGNOSIS — N1831 Chronic kidney disease, stage 3a: Secondary | ICD-10-CM | POA: Diagnosis not present

## 2022-03-18 DIAGNOSIS — K117 Disturbances of salivary secretion: Secondary | ICD-10-CM

## 2022-03-18 DIAGNOSIS — R1310 Dysphagia, unspecified: Secondary | ICD-10-CM | POA: Diagnosis not present

## 2022-03-18 DIAGNOSIS — Z931 Gastrostomy status: Secondary | ICD-10-CM | POA: Diagnosis not present

## 2022-03-18 DIAGNOSIS — L03311 Cellulitis of abdominal wall: Secondary | ICD-10-CM | POA: Diagnosis not present

## 2022-03-18 DIAGNOSIS — K9422 Gastrostomy infection: Secondary | ICD-10-CM | POA: Diagnosis not present

## 2022-03-18 DIAGNOSIS — S12500D Unspecified displaced fracture of sixth cervical vertebra, subsequent encounter for fracture with routine healing: Secondary | ICD-10-CM | POA: Diagnosis not present

## 2022-03-18 DIAGNOSIS — S31104A Unspecified open wound of abdominal wall, left lower quadrant without penetration into peritoneal cavity, initial encounter: Secondary | ICD-10-CM | POA: Diagnosis not present

## 2022-03-18 DIAGNOSIS — G479 Sleep disorder, unspecified: Secondary | ICD-10-CM | POA: Diagnosis not present

## 2022-03-18 DIAGNOSIS — R4589 Other symptoms and signs involving emotional state: Secondary | ICD-10-CM

## 2022-03-18 DIAGNOSIS — R531 Weakness: Secondary | ICD-10-CM | POA: Diagnosis not present

## 2022-03-18 LAB — MICROSCOPIC EXAMINATION
Epithelial Cells (non renal): NONE SEEN /hpf (ref 0–10)
RBC, Urine: >30 /hpf — AB (ref 0–2)
Renal Epithel, UA: NONE SEEN /hpf
WBC, UA: >30 /hpf — AB (ref 0–5)

## 2022-03-18 LAB — URINALYSIS, ROUTINE W REFLEX MICROSCOPIC
Bilirubin, UA: NEGATIVE
Glucose, UA: NEGATIVE
Nitrite, UA: POSITIVE — AB
Specific Gravity, UA: 1.015 (ref 1.005–1.030)
Urobilinogen, Ur: 1 mg/dL (ref 0.2–1.0)
pH, UA: 5 (ref 5.0–7.5)

## 2022-03-18 MED ORDER — FLUCONAZOLE 150 MG PO TABS: 150.0000 mg | ORAL_TABLET | Freq: Once | ORAL | 0 refills | Status: AC

## 2022-03-18 MED ORDER — VENLAFAXINE HCL 37.5 MG PO TABS: 37.5000 mg | ORAL_TABLET | Freq: Every day | ORAL | 2 refills | Status: DC
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
## 2022-03-18 NOTE — Patient Instructions (Signed)
I'm thinking of possible Pilocarpine but this can have cardiac impact so I'll reach out to cardiology for advice. IN the meantime, popsicles/ ice Fluconazole sent to have on hand Home Health Nutrition ordered Melatonin (max dose '10mg'$ / nt) NO benadryl Ok to increase Effexor

## 2022-03-19 ENCOUNTER — Other Ambulatory Visit: Payer: Self-pay | Admitting: Family Medicine

## 2022-03-19 ENCOUNTER — Telehealth: Payer: Self-pay | Admitting: Emergency Medicine

## 2022-03-19 ENCOUNTER — Telehealth: Payer: Self-pay | Admitting: *Deleted

## 2022-03-19 DIAGNOSIS — E43 Unspecified severe protein-calorie malnutrition: Secondary | ICD-10-CM | POA: Diagnosis not present

## 2022-03-19 DIAGNOSIS — G9341 Metabolic encephalopathy: Secondary | ICD-10-CM | POA: Diagnosis not present

## 2022-03-19 DIAGNOSIS — R1310 Dysphagia, unspecified: Secondary | ICD-10-CM | POA: Diagnosis not present

## 2022-03-19 DIAGNOSIS — Z931 Gastrostomy status: Secondary | ICD-10-CM | POA: Diagnosis not present

## 2022-03-19 DIAGNOSIS — E119 Type 2 diabetes mellitus without complications: Secondary | ICD-10-CM | POA: Diagnosis not present

## 2022-03-19 LAB — RENAL FUNCTION PANEL
Albumin: 3.1 g/dL — ABNORMAL LOW (ref 3.7–4.7)
BUN/Creatinine Ratio: 65 — ABNORMAL HIGH (ref 10–24)
BUN: 50 mg/dL — ABNORMAL HIGH (ref 8–27)
CO2: 23 mmol/L (ref 20–29)
Calcium: 9.3 mg/dL (ref 8.6–10.2)
Chloride: 110 mmol/L — ABNORMAL HIGH (ref 96–106)
Creatinine, Ser: 0.77 mg/dL (ref 0.76–1.27)
Glucose: 133 mg/dL — ABNORMAL HIGH (ref 70–99)
Phosphorus: 3 mg/dL (ref 2.8–4.1)
Potassium: 4.4 mmol/L (ref 3.5–5.2)
Sodium: 145 mmol/L — ABNORMAL HIGH (ref 134–144)
eGFR: 87 mL/min/{1.73_m2} (ref >59)

## 2022-03-19 LAB — CBC
Hematocrit: 25 % — ABNORMAL LOW (ref 37.5–51.0)
Hemoglobin: 7.5 g/dL — CL (ref 13.0–17.7)
MCH: 29.9 pg (ref 26.6–33.0)
MCHC: 30 g/dL — ABNORMAL LOW (ref 31.5–35.7)
MCV: 100 fL — ABNORMAL HIGH (ref 79–97)
Platelets: 96 10*3/uL — CL (ref 150–450)
RBC: 2.51 x10E6/uL — CL (ref 4.14–5.80)
RDW: 20.5 % — ABNORMAL HIGH (ref 11.6–15.4)
WBC: 7.5 10*3/uL (ref 3.4–10.8)

## 2022-03-19 LAB — HEPATIC FUNCTION PANEL
ALT: 33 IU/L (ref 0–44)
AST: 27 IU/L (ref 0–40)
Alkaline Phosphatase: 99 IU/L (ref 44–121)
Bilirubin Total: 0.5 mg/dL (ref 0.0–1.2)
Bilirubin, Direct: 0.23 mg/dL (ref 0.00–0.40)
Total Protein: 5.4 g/dL — ABNORMAL LOW (ref 6.0–8.5)

## 2022-03-19 MED ORDER — SULFAMETHOXAZOLE-TRIMETHOPRIM 800-160 MG PO TABS: 1.0000 | ORAL_TABLET | Freq: Two times a day (BID) | ORAL | 0 refills | Status: DC

## 2022-03-19 NOTE — Telephone Encounter (Signed)
Critical Lab from [facility]:  Hemoglobin @ 7.5

## 2022-03-19 NOTE — Telephone Encounter (Signed)
TC to [place] since he lives in [place] he is serviced by the [facility]. Request to add nutritionist to help w/ Gtube feedings and [place] nurse to assist w/ oral care needs given marked xerostomia Per nurse intake manager they do not have a nutritionist, she is not sure which if any agencies that do.He would really benefit from the help from an outpatient nutritionist but unsure that the daughter is able to get the patient out of the house to attend appointments. Pt has all other disciplines, speech has been working with him with his oral care and instructions but this is limited d/t his gag reflex. They have recommended to pt have at least a Hospice evaluation/consult.

## 2022-03-19 NOTE — Telephone Encounter (Signed)
If daughter, who is POA, is willing to accept I am glad to place Hospice referral as I agree this would help tremendously with needs.  Please inform of my recommendation and I will place referral accordingly

## 2022-03-19 NOTE — Telephone Encounter (Signed)
Faxed to [facility] by Francene Boyers per notes left on my desk

## 2022-03-20 DIAGNOSIS — L03311 Cellulitis of abdominal wall: Secondary | ICD-10-CM | POA: Diagnosis not present

## 2022-03-20 DIAGNOSIS — R1310 Dysphagia, unspecified: Secondary | ICD-10-CM | POA: Diagnosis not present

## 2022-03-20 DIAGNOSIS — S12500D Unspecified displaced fracture of sixth cervical vertebra, subsequent encounter for fracture with routine healing: Secondary | ICD-10-CM | POA: Diagnosis not present

## 2022-03-20 DIAGNOSIS — S31104A Unspecified open wound of abdominal wall, left lower quadrant without penetration into peritoneal cavity, initial encounter: Secondary | ICD-10-CM | POA: Diagnosis not present

## 2022-03-20 DIAGNOSIS — K9422 Gastrostomy infection: Secondary | ICD-10-CM | POA: Diagnosis not present

## 2022-03-20 NOTE — Telephone Encounter (Addendum)
[name] has contacted [facility], waiting to hear back from them. Re: nutritionist she was thinking that through [facility] where they get their supplies that there maybe someone that can help them. I think there maybe someone w/ them as well, but I am not sure who that is and do not have any notes. Do you have any information on this or know how to place an order or referral?

## 2022-03-21 ENCOUNTER — Encounter: Payer: Self-pay | Admitting: Cardiology

## 2022-03-21 ENCOUNTER — Ambulatory Visit: Payer: Medicare Other | Admitting: Cardiology

## 2022-03-21 VITALS — BP 110/74 | HR 84

## 2022-03-21 DIAGNOSIS — I255 Ischemic cardiomyopathy: Secondary | ICD-10-CM

## 2022-03-21 DIAGNOSIS — I4891 Unspecified atrial fibrillation: Secondary | ICD-10-CM | POA: Diagnosis not present

## 2022-03-21 DIAGNOSIS — I2510 Atherosclerotic heart disease of native coronary artery without angina pectoris: Secondary | ICD-10-CM

## 2022-03-21 MED ORDER — LOSARTAN POTASSIUM 25 MG PO TABS: 12.5000 mg | ORAL_TABLET | Freq: Every day | ORAL | 6 refills | Status: DC
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
## 2022-03-21 NOTE — Telephone Encounter (Signed)
I don't have any info on that but I can certainly place a referral to palliative.

## 2022-03-21 NOTE — Patient Instructions (Signed)
Medication Instructions:  Begin Losartan 12.'5mg'$  daily  Continue all other medications.     Labwork: none  Testing/Procedures: none  Follow-Up: 3 months   Any Other Special Instructions Will Be Listed Below (If Applicable). Please call / mychart the office in 2 weeks with update on home BP readings.   If you need a refill on your cardiac medications before your next appointment, please call your pharmacy.

## 2022-03-22 LAB — URINE CULTURE

## 2022-03-24 ENCOUNTER — Inpatient Hospital Stay (HOSPITAL_COMMUNITY)
Admission: EM | Admit: 2022-03-24 | Discharge: 2022-04-06 | DRG: 662 | Disposition: A | Discharge status: Home Health | Payer: Medicare Other | Attending: Internal Medicine | Admitting: Internal Medicine

## 2022-03-24 ENCOUNTER — Telehealth: Payer: Self-pay | Admitting: Family Medicine

## 2022-03-24 ENCOUNTER — Emergency Department (HOSPITAL_COMMUNITY): Payer: Medicare Other

## 2022-03-24 ENCOUNTER — Other Ambulatory Visit: Payer: Self-pay

## 2022-03-24 DIAGNOSIS — Z87891 Personal history of nicotine dependence: Secondary | ICD-10-CM

## 2022-03-24 DIAGNOSIS — Z1621 Resistance to vancomycin: Secondary | ICD-10-CM | POA: Diagnosis not present

## 2022-03-24 DIAGNOSIS — R5383 Other fatigue: Secondary | ICD-10-CM | POA: Diagnosis not present

## 2022-03-24 DIAGNOSIS — N281 Cyst of kidney, acquired: Secondary | ICD-10-CM | POA: Diagnosis present

## 2022-03-24 DIAGNOSIS — Z7951 Long term (current) use of inhaled steroids: Secondary | ICD-10-CM

## 2022-03-24 DIAGNOSIS — E872 Acidosis, unspecified: Secondary | ICD-10-CM | POA: Diagnosis present

## 2022-03-24 DIAGNOSIS — Z931 Gastrostomy status: Secondary | ICD-10-CM | POA: Diagnosis not present

## 2022-03-24 DIAGNOSIS — L97429 Non-pressure chronic ulcer of left heel and midfoot with unspecified severity: Secondary | ICD-10-CM | POA: Diagnosis present

## 2022-03-24 DIAGNOSIS — Z8042 Family history of malignant neoplasm of prostate: Secondary | ICD-10-CM

## 2022-03-24 DIAGNOSIS — R1110 Vomiting, unspecified: Secondary | ICD-10-CM | POA: Diagnosis not present

## 2022-03-24 DIAGNOSIS — Z9189 Other specified personal risk factors, not elsewhere classified: Secondary | ICD-10-CM | POA: Diagnosis not present

## 2022-03-24 DIAGNOSIS — G9341 Metabolic encephalopathy: Secondary | ICD-10-CM | POA: Diagnosis not present

## 2022-03-24 DIAGNOSIS — G4733 Obstructive sleep apnea (adult) (pediatric): Secondary | ICD-10-CM | POA: Diagnosis present

## 2022-03-24 DIAGNOSIS — R1310 Dysphagia, unspecified: Secondary | ICD-10-CM | POA: Diagnosis present

## 2022-03-24 DIAGNOSIS — I5022 Chronic systolic (congestive) heart failure: Secondary | ICD-10-CM | POA: Diagnosis present

## 2022-03-24 DIAGNOSIS — L97419 Non-pressure chronic ulcer of right heel and midfoot with unspecified severity: Secondary | ICD-10-CM | POA: Diagnosis not present

## 2022-03-24 DIAGNOSIS — Z79899 Other long term (current) drug therapy: Secondary | ICD-10-CM

## 2022-03-24 DIAGNOSIS — Z87440 Personal history of urinary (tract) infections: Secondary | ICD-10-CM

## 2022-03-24 DIAGNOSIS — F32A Depression, unspecified: Secondary | ICD-10-CM | POA: Diagnosis not present

## 2022-03-24 DIAGNOSIS — Y738 Miscellaneous gastroenterology and urology devices associated with adverse incidents, not elsewhere classified: Secondary | ICD-10-CM | POA: Diagnosis not present

## 2022-03-24 DIAGNOSIS — L98429 Non-pressure chronic ulcer of back with unspecified severity: Secondary | ICD-10-CM | POA: Diagnosis present

## 2022-03-24 DIAGNOSIS — J9601 Acute respiratory failure with hypoxia: Secondary | ICD-10-CM

## 2022-03-24 DIAGNOSIS — A419 Sepsis, unspecified organism: Secondary | ICD-10-CM | POA: Diagnosis not present

## 2022-03-24 DIAGNOSIS — I110 Hypertensive heart disease with heart failure: Secondary | ICD-10-CM | POA: Diagnosis not present

## 2022-03-24 DIAGNOSIS — R319 Hematuria, unspecified: Secondary | ICD-10-CM | POA: Diagnosis present

## 2022-03-24 DIAGNOSIS — T83031A Leakage of indwelling urethral catheter, initial encounter: Secondary | ICD-10-CM | POA: Diagnosis not present

## 2022-03-24 DIAGNOSIS — N3289 Other specified disorders of bladder: Secondary | ICD-10-CM | POA: Diagnosis not present

## 2022-03-24 DIAGNOSIS — N390 Urinary tract infection, site not specified: Secondary | ICD-10-CM | POA: Diagnosis not present

## 2022-03-24 DIAGNOSIS — M19012 Primary osteoarthritis, left shoulder: Secondary | ICD-10-CM | POA: Diagnosis present

## 2022-03-24 DIAGNOSIS — D62 Acute posthemorrhagic anemia: Secondary | ICD-10-CM | POA: Diagnosis not present

## 2022-03-24 DIAGNOSIS — Z950 Presence of cardiac pacemaker: Secondary | ICD-10-CM

## 2022-03-24 DIAGNOSIS — Z9049 Acquired absence of other specified parts of digestive tract: Secondary | ICD-10-CM | POA: Diagnosis not present

## 2022-03-24 DIAGNOSIS — N179 Acute kidney failure, unspecified: Secondary | ICD-10-CM

## 2022-03-24 DIAGNOSIS — B952 Enterococcus as the cause of diseases classified elsewhere: Secondary | ICD-10-CM | POA: Diagnosis present

## 2022-03-24 DIAGNOSIS — D649 Anemia, unspecified: Secondary | ICD-10-CM | POA: Diagnosis not present

## 2022-03-24 DIAGNOSIS — J690 Pneumonitis due to inhalation of food and vomit: Secondary | ICD-10-CM

## 2022-03-24 DIAGNOSIS — N3000 Acute cystitis without hematuria: Secondary | ICD-10-CM | POA: Diagnosis present

## 2022-03-24 DIAGNOSIS — S32591D Other specified fracture of right pubis, subsequent encounter for fracture with routine healing: Secondary | ICD-10-CM

## 2022-03-24 DIAGNOSIS — E876 Hypokalemia: Secondary | ICD-10-CM | POA: Diagnosis not present

## 2022-03-24 DIAGNOSIS — Z743 Need for continuous supervision: Secondary | ICD-10-CM | POA: Diagnosis not present

## 2022-03-24 DIAGNOSIS — Z888 Allergy status to other drugs, medicaments and biological substances status: Secondary | ICD-10-CM

## 2022-03-24 DIAGNOSIS — J449 Chronic obstructive pulmonary disease, unspecified: Secondary | ICD-10-CM | POA: Diagnosis present

## 2022-03-24 DIAGNOSIS — N3021 Other chronic cystitis with hematuria: Secondary | ICD-10-CM | POA: Diagnosis present

## 2022-03-24 DIAGNOSIS — Z6824 Body mass index (BMI) 24.0-24.9, adult: Secondary | ICD-10-CM

## 2022-03-24 DIAGNOSIS — K9422 Gastrostomy infection: Secondary | ICD-10-CM | POA: Diagnosis not present

## 2022-03-24 DIAGNOSIS — Z993 Dependence on wheelchair: Secondary | ICD-10-CM

## 2022-03-24 DIAGNOSIS — R1319 Other dysphagia: Secondary | ICD-10-CM | POA: Diagnosis not present

## 2022-03-24 DIAGNOSIS — Z515 Encounter for palliative care: Secondary | ICD-10-CM

## 2022-03-24 DIAGNOSIS — R310 Gross hematuria: Secondary | ICD-10-CM | POA: Diagnosis not present

## 2022-03-24 DIAGNOSIS — I2510 Atherosclerotic heart disease of native coronary artery without angina pectoris: Secondary | ICD-10-CM | POA: Diagnosis present

## 2022-03-24 DIAGNOSIS — Z7189 Other specified counseling: Secondary | ICD-10-CM | POA: Diagnosis not present

## 2022-03-24 DIAGNOSIS — R339 Retention of urine, unspecified: Secondary | ICD-10-CM

## 2022-03-24 DIAGNOSIS — E119 Type 2 diabetes mellitus without complications: Secondary | ICD-10-CM | POA: Diagnosis present

## 2022-03-24 DIAGNOSIS — D696 Thrombocytopenia, unspecified: Secondary | ICD-10-CM | POA: Diagnosis not present

## 2022-03-24 DIAGNOSIS — Z8249 Family history of ischemic heart disease and other diseases of the circulatory system: Secondary | ICD-10-CM

## 2022-03-24 DIAGNOSIS — R338 Other retention of urine: Secondary | ICD-10-CM | POA: Diagnosis not present

## 2022-03-24 DIAGNOSIS — I10 Essential (primary) hypertension: Secondary | ICD-10-CM | POA: Diagnosis not present

## 2022-03-24 DIAGNOSIS — I480 Paroxysmal atrial fibrillation: Secondary | ICD-10-CM | POA: Diagnosis present

## 2022-03-24 DIAGNOSIS — Z85828 Personal history of other malignant neoplasm of skin: Secondary | ICD-10-CM

## 2022-03-24 DIAGNOSIS — M47814 Spondylosis without myelopathy or radiculopathy, thoracic region: Secondary | ICD-10-CM | POA: Diagnosis present

## 2022-03-24 DIAGNOSIS — S31104A Unspecified open wound of abdominal wall, left lower quadrant without penetration into peritoneal cavity, initial encounter: Secondary | ICD-10-CM | POA: Diagnosis not present

## 2022-03-24 DIAGNOSIS — Z9989 Dependence on other enabling machines and devices: Secondary | ICD-10-CM | POA: Diagnosis not present

## 2022-03-24 DIAGNOSIS — N3081 Other cystitis with hematuria: Secondary | ICD-10-CM | POA: Diagnosis not present

## 2022-03-24 DIAGNOSIS — E871 Hypo-osmolality and hyponatremia: Secondary | ICD-10-CM | POA: Diagnosis not present

## 2022-03-24 DIAGNOSIS — Z7401 Bed confinement status: Secondary | ICD-10-CM

## 2022-03-24 DIAGNOSIS — M19011 Primary osteoarthritis, right shoulder: Secondary | ICD-10-CM | POA: Diagnosis present

## 2022-03-24 DIAGNOSIS — E785 Hyperlipidemia, unspecified: Secondary | ICD-10-CM | POA: Diagnosis present

## 2022-03-24 DIAGNOSIS — Z951 Presence of aortocoronary bypass graft: Secondary | ICD-10-CM

## 2022-03-24 DIAGNOSIS — R627 Adult failure to thrive: Secondary | ICD-10-CM | POA: Diagnosis present

## 2022-03-24 DIAGNOSIS — Z96653 Presence of artificial knee joint, bilateral: Secondary | ICD-10-CM | POA: Diagnosis present

## 2022-03-24 DIAGNOSIS — K9429 Other complications of gastrostomy: Secondary | ICD-10-CM | POA: Diagnosis not present

## 2022-03-24 DIAGNOSIS — Z7901 Long term (current) use of anticoagulants: Secondary | ICD-10-CM

## 2022-03-24 DIAGNOSIS — Z8546 Personal history of malignant neoplasm of prostate: Secondary | ICD-10-CM | POA: Diagnosis not present

## 2022-03-24 DIAGNOSIS — N3040 Irradiation cystitis without hematuria: Secondary | ICD-10-CM | POA: Diagnosis not present

## 2022-03-24 DIAGNOSIS — R410 Disorientation, unspecified: Secondary | ICD-10-CM | POA: Diagnosis not present

## 2022-03-24 DIAGNOSIS — Z930 Tracheostomy status: Secondary | ICD-10-CM | POA: Diagnosis not present

## 2022-03-24 DIAGNOSIS — K219 Gastro-esophageal reflux disease without esophagitis: Secondary | ICD-10-CM | POA: Diagnosis present

## 2022-03-24 DIAGNOSIS — I441 Atrioventricular block, second degree: Secondary | ICD-10-CM | POA: Diagnosis present

## 2022-03-24 DIAGNOSIS — N3001 Acute cystitis with hematuria: Principal | ICD-10-CM | POA: Diagnosis present

## 2022-03-24 DIAGNOSIS — R0902 Hypoxemia: Secondary | ICD-10-CM | POA: Diagnosis not present

## 2022-03-24 DIAGNOSIS — Z880 Allergy status to penicillin: Secondary | ICD-10-CM

## 2022-03-24 DIAGNOSIS — E43 Unspecified severe protein-calorie malnutrition: Secondary | ICD-10-CM | POA: Diagnosis not present

## 2022-03-24 DIAGNOSIS — R112 Nausea with vomiting, unspecified: Secondary | ICD-10-CM | POA: Diagnosis not present

## 2022-03-24 DIAGNOSIS — Z8673 Personal history of transient ischemic attack (TIA), and cerebral infarction without residual deficits: Secondary | ICD-10-CM

## 2022-03-24 DIAGNOSIS — R0602 Shortness of breath: Secondary | ICD-10-CM | POA: Diagnosis not present

## 2022-03-24 DIAGNOSIS — S12500D Unspecified displaced fracture of sixth cervical vertebra, subsequent encounter for fracture with routine healing: Secondary | ICD-10-CM | POA: Diagnosis not present

## 2022-03-24 DIAGNOSIS — L03311 Cellulitis of abdominal wall: Secondary | ICD-10-CM | POA: Diagnosis not present

## 2022-03-24 DIAGNOSIS — Z7984 Long term (current) use of oral hypoglycemic drugs: Secondary | ICD-10-CM

## 2022-03-24 DIAGNOSIS — Z825 Family history of asthma and other chronic lower respiratory diseases: Secondary | ICD-10-CM

## 2022-03-24 DIAGNOSIS — N3041 Irradiation cystitis with hematuria: Secondary | ICD-10-CM | POA: Diagnosis not present

## 2022-03-24 LAB — LACTIC ACID, PLASMA
Lactic Acid, Venous: 3.5 mmol/L (ref 0.5–1.9)
Lactic Acid, Venous: 2.4 mmol/L (ref 0.5–1.9)

## 2022-03-24 LAB — URINALYSIS, ROUTINE W REFLEX MICROSCOPIC

## 2022-03-24 LAB — CBG MONITORING, ED: Glucose-Capillary: 133 mg/dL — ABNORMAL HIGH (ref 70–99)

## 2022-03-24 LAB — CBC WITH DIFFERENTIAL/PLATELET
Abs Immature Granulocytes: 0 10*3/uL (ref 0.00–0.07)
Band Neutrophils: 9 %
Basophils Absolute: 0 10*3/uL (ref 0.0–0.1)
Basophils Relative: 0 %
Eosinophils Absolute: 0 10*3/uL (ref 0.0–0.5)
Eosinophils Relative: 0 %
HCT: 21.1 % — ABNORMAL LOW (ref 39.0–52.0)
Hemoglobin: 6 g/dL — CL (ref 13.0–17.0)
Lymphocytes Relative: 17 %
Lymphs Abs: 1.4 10*3/uL (ref 0.7–4.0)
MCH: 30 pg (ref 26.0–34.0)
MCHC: 28.4 g/dL — ABNORMAL LOW (ref 30.0–36.0)
MCV: 105.5 fL — ABNORMAL HIGH (ref 80.0–100.0)
Monocytes Absolute: 0.4 10*3/uL (ref 0.1–1.0)
Monocytes Relative: 5 %
Neutro Abs: 6.6 10*3/uL (ref 1.7–7.7)
Neutrophils Relative %: 69 %
Platelets: 124 10*3/uL — ABNORMAL LOW (ref 150–400)
RBC: 2 MIL/uL — ABNORMAL LOW (ref 4.22–5.81)
RDW: 23.1 % — ABNORMAL HIGH (ref 11.5–15.5)
WBC: 8.5 10*3/uL (ref 4.0–10.5)
nRBC: 0 % (ref 0.0–0.2)

## 2022-03-24 LAB — COMPREHENSIVE METABOLIC PANEL
ALT: 26 U/L (ref 0–44)
AST: 36 U/L (ref 15–41)
Albumin: 2.4 g/dL — ABNORMAL LOW (ref 3.5–5.0)
Alkaline Phosphatase: 85 U/L (ref 38–126)
Anion gap: 6 (ref 5–15)
BUN: 66 mg/dL — ABNORMAL HIGH (ref 8–23)
CO2: 27 mmol/L (ref 22–32)
Calcium: 10.4 mg/dL — ABNORMAL HIGH (ref 8.9–10.3)
Chloride: 107 mmol/L (ref 98–111)
Creatinine, Ser: 1.34 mg/dL — ABNORMAL HIGH (ref 0.61–1.24)
GFR, Estimated: 51 mL/min — ABNORMAL LOW (ref >60)
Glucose, Bld: 152 mg/dL — ABNORMAL HIGH (ref 70–99)
Potassium: 4.8 mmol/L (ref 3.5–5.1)
Sodium: 140 mmol/L (ref 135–145)
Total Bilirubin: 0.5 mg/dL (ref 0.3–1.2)
Total Protein: 5.3 g/dL — ABNORMAL LOW (ref 6.5–8.1)

## 2022-03-24 LAB — URINALYSIS, MICROSCOPIC (REFLEX): RBC / HPF: >50 RBC/hpf (ref 0–5)

## 2022-03-24 LAB — PROTIME-INR
INR: 1.6 — ABNORMAL HIGH (ref 0.8–1.2)
Prothrombin Time: 19.1 seconds — ABNORMAL HIGH (ref 11.4–15.2)

## 2022-03-24 LAB — APTT: aPTT: 34 seconds (ref 24–36)

## 2022-03-24 LAB — PREPARE RBC (CROSSMATCH)

## 2022-03-24 MED ORDER — IOHEXOL 350 MG/ML SOLN
100.0000 mL | Freq: Once | INTRAVENOUS | Status: AC | PRN
  Administered 2022-03-24: 100 mL via INTRAVENOUS

## 2022-03-24 MED ORDER — SODIUM CHLORIDE 0.9 % IV SOLN
10.0000 mL/h | Freq: Once | INTRAVENOUS | Status: AC
  Administered 2022-03-24: 10 mL/h via INTRAVENOUS

## 2022-03-24 MED ORDER — METRONIDAZOLE 500 MG/100ML IV SOLN
500.0000 mg | Freq: Once | INTRAVENOUS | Status: AC
  Administered 2022-03-24: 500 mg via INTRAVENOUS
  Filled 2022-03-24: qty 100

## 2022-03-24 MED ORDER — SODIUM CHLORIDE 0.9 % IV SOLN
2.0000 g | Freq: Two times a day (BID) | INTRAVENOUS | Status: DC
  Administered 2022-03-25 – 2022-03-26 (×4): 2 g via INTRAVENOUS
  Filled 2022-03-24 (×4): qty 12.5

## 2022-03-24 MED ORDER — LACTATED RINGERS IV BOLUS
500.0000 mL | Freq: Once | INTRAVENOUS | Status: AC
  Administered 2022-03-24: 500 mL via INTRAVENOUS

## 2022-03-24 MED ORDER — SODIUM CHLORIDE 0.9 % IV SOLN
2.0000 g | Freq: Once | INTRAVENOUS | Status: AC
  Administered 2022-03-24: 2 g via INTRAVENOUS
  Filled 2022-03-24: qty 12.5

## 2022-03-24 NOTE — ED Notes (Signed)
IV team at bedside to obtain 2nd PIV.

## 2022-03-24 NOTE — ED Triage Notes (Signed)
Pt here via [facility] from home for vomiting. Per daughter anytime she pushes anything through his G tube, pt has projectile vomiting. Pt also has gross hematuria in foley. Per daughter pt is at his baseline mentation. Per EMS pt was 87% on RA, they placed him on 4L O2 via nasal cannula and SpO2 increased to 96%. 92/45, [id number]

## 2022-03-24 NOTE — ED Provider Notes (Signed)
[facility] EMERGENCY DEPARTMENT Provider Note   CSN: [id number] Arrival date & time: 03/24/22  1806     History  Chief Complaint  Patient presents with   Emesis    [name] is a 88 y.o. male.  HPI 88 year old male presents from home via EMS with vomiting.  History is provided pretty much entirely by the daughter.  Patient has a GJ tube and since this morning around 2 AM he had a couple episodes of vomiting.  He is on continuous feeds and she is nose vomiting twice and abdominal distention and is concerned about bowel obstruction.  She also notes he has chronic hematuria which is pretty much unchanged.  EMS noted he had 87% sats on room air and put on oxygen.  He has on and off cough but nothing and particularly worse today.  He does have a dry mouth according to daughter.  No fevers.  Chronic mental status changes which seem to be at baseline per the daughter.  Home Medications Prior to Admission medications   Medication Sig Start Date End Date Taking? Authorizing Provider  acetaminophen (TYLENOL) 160 MG/5ML solution Place 31.3 mLs (1,000 mg total) into feeding tube every 6 (six) hours. Patient taking differently: Place 960 mg into feeding tube every 6 (six) hours. 30 mls - 960 mg 01/21/22   Nicole Kindred A, DO  albuterol (PROVENTIL HFA;VENTOLIN HFA) 108 (90 Base) MCG/ACT inhaler Inhale 2 puffs into the lungs every 6 (six) hours as needed for wheezing or shortness of breath. 02/12/18   [name], [name]  alum & mag hydroxide-simeth (MAALOX/MYLANTA) 200-200-20 MG/5ML suspension Place 30 mLs into feeding tube every 4 (four) hours as needed for indigestion, heartburn or flatulence. 01/21/22   Ezekiel Slocumb, DO  amiodarone (PACERONE) 200 MG tablet Place 1 tablet (200 mg total) into feeding tube daily. 01/22/22   Ezekiel Slocumb, DO  apixaban (ELIQUIS) 5 MG TABS tablet Place 1 tablet (5 mg total) into feeding tube 2 (two) times daily. 01/21/22   Ezekiel Slocumb, DO  azelastine (ASTELIN) 0.1 % nasal spray Place 1 spray into both nostrils 2 (two) times daily. 10/15/21   [name], DO  bisacodyl (DULCOLAX) 10 MG suppository Place 1 suppository (10 mg total) rectally daily as needed for moderate constipation. 12/17/21   Winferd Humphrey, PA-C  budesonide-formoterol (SYMBICORT) 160-4.5 MCG/ACT inhaler Inhale 2 puffs into the lungs 2 (two) times daily. 03/22/19   [name], [name], [name]  Cholecalciferol (D3 2000 PO) Take 1 tablet by mouth daily.    [provider]  diphenoxylate-atropine (LOMOTIL) 2.5-0.025 MG/5ML liquid Place 5 mLs into feeding tube 4 (four) times daily as needed for diarrhea or loose stools. 02/19/22   Lavina Hamman, MD  doxazosin (CARDURA) 1 MG tablet PLACE 1 TABLET (1 MG TOTAL) INTO FEEDING TUBE DAILY. 03/14/22   [name], DO  famotidine (PEPCID) 20 MG tablet PLACE 1 TABLET (20 MG TOTAL) INTO FEEDING TUBE 2 (TWO) TIMES DAILY. 03/14/22 05/13/22  [name], DO  ferrous gluconate (FERGON) 240 (27 FE) MG tablet Take 240 mg by mouth every evening.    [provider]  fluticasone (FLONASE) 50 MCG/ACT nasal spray Place 2 sprays into both nostrils daily as needed for allergies or rhinitis.    [provider]  Lancets Glory Rosebush ULTRASOFT) lancets Use to check blood sugars daily 10/16/17   [name], [name], [name]  losartan (COZAAR) 25 MG tablet Take 0.5 tablets (12.5 mg total) by  mouth daily. 03/21/22   Arnoldo Lenis, MD  meclizine (ANTIVERT) 25 MG tablet Take 1 tablet (25 mg total) by mouth 3 (three) times daily as needed for dizziness. 05/13/21   Sherwood Gambler, MD  MELATONIN PO Take 5-10 mg by mouth at bedtime.    [provider]  metFORMIN (GLUCOPHAGE) 500 MG tablet Take 1 tablet (500 mg total) by mouth daily with breakfast. Patient taking differently: Place 500 mg into feeding tube daily with breakfast. 10/15/21   [name], DO  nutrition supplement, JUVEN,  (JUVEN) PACK Place 1 packet into feeding tube 2 (two) times daily between meals. 02/19/22   Lavina Hamman, MD  Nutritional Supplements (FEEDING SUPPLEMENT, JEVITY 1.5 CAL/FIBER,) LIQD Place 1,000 mLs into feeding tube daily. At 55 ml/hour 02/19/22   Lavina Hamman, MD  Nutritional Supplements (FEEDING SUPPLEMENT, PROSOURCE TF,) liquid Place 45 mLs into feeding tube daily. Patient not taking: Reported on 03/21/2022 02/20/22   Lavina Hamman, MD  ondansetron (ZOFRAN-ODT) 8 MG disintegrating tablet Take 1 tablet (8 mg total) by mouth every 8 (eight) hours as needed for nausea or vomiting. 03/03/22   [name], DO  ONETOUCH VERIO test strip TEST BLOOD SUGARS DAILY DX E11.9 07/22/21   Ronnie Doss M, DO  pantoprazole (PROTONIX) 20 MG tablet 40 mg every morning. Per tube    [provider]  rosuvastatin (CRESTOR) 40 MG tablet TAKE 1 TABLET BY MOUTH  DAILY 02/11/22   [name], DO  Spacer/Aero-Hold Chamber Bags MISC 1 each by Does not apply route as needed. 02/19/22   Lavina Hamman, MD  sulfamethoxazole-trimethoprim (BACTRIM DS) 800-160 MG tablet Place 1 tablet into feeding tube 2 (two) times daily for 7 days. For UTI 03/19/22 03/26/22  [name], DO  venlafaxine (EFFEXOR) 37.5 MG tablet Place 1 tablet (37.5 mg total) into feeding tube at bedtime. 03/18/22   [name], DO  Water For Irrigation, Sterile (FREE WATER) SOLN Place 100 mLs into feeding tube every 4 (four) hours. 02/19/22   Lavina Hamman, MD  Zinc Oxide (TRIPLE PASTE) 12.8 % ointment Apply topically 2 (two) times daily. 02/19/22   Lavina Hamman, MD      Allergies    Penicillins, Tramadol, and Ketorolac tromethamine    Review of Systems   Review of Systems  Constitutional:  Negative for fever.  Respiratory:  Positive for cough.   Gastrointestinal:  Positive for abdominal distention and vomiting.  Genitourinary:  Positive for hematuria.    Physical Exam Updated Vital Signs BP (!) 103/54    Pulse 80   Temp 98.1 F (36.7 C) (Temporal)   Resp (!) 22   Ht '5\' 11"'$  (1.803 m)   Wt 80 kg   SpO2 96%   BMI 24.60 kg/m  Physical Exam Vitals and nursing note reviewed.  Constitutional:      Appearance: He is well-developed. He is not diaphoretic.  HENT:     Head: Normocephalic and atraumatic.  Cardiovascular:     Rate and Rhythm: Normal rate and regular rhythm.     Heart sounds: Normal heart sounds.  Pulmonary:     Effort: Pulmonary effort is normal. Tachypnea present. No accessory muscle usage or respiratory distress.     Breath sounds: Rhonchi present.  Abdominal:     Palpations: Abdomen is soft.     Tenderness: There is abdominal tenderness in the right lower quadrant and left lower quadrant.     Comments: G-tube in place  Genitourinary:    Comments: Foley catheter in place with gross hematuria Skin:    General: Skin is warm and dry.  Neurological:     Mental Status: He is lethargic.     Comments: Patient awakens to some stimulation but is generally altered     ED Results / Procedures / Treatments   Labs (all labs ordered are listed, but only abnormal results are displayed) Labs Reviewed  LACTIC ACID, PLASMA - Abnormal; Notable for the following components:      Result Value   Lactic Acid, Venous 3.5 (*)    All other components within normal limits  LACTIC ACID, PLASMA - Abnormal; Notable for the following components:   Lactic Acid, Venous 2.4 (*)    All other components within normal limits  COMPREHENSIVE METABOLIC PANEL - Abnormal; Notable for the following components:   Glucose, Bld 152 (*)    BUN 66 (*)    Creatinine, Ser 1.34 (*)    Calcium 10.4 (*)    Total Protein 5.3 (*)    Albumin 2.4 (*)    GFR, Estimated 51 (*)    All other components within normal limits  CBC WITH DIFFERENTIAL/PLATELET - Abnormal; Notable for the following components:   RBC 2.00 (*)    Hemoglobin 6.0 (*)    HCT 21.1 (*)    MCV 105.5 (*)    MCHC 28.4 (*)    RDW 23.1 (*)     Platelets 124 (*)    All other components within normal limits  PROTIME-INR - Abnormal; Notable for the following components:   Prothrombin Time 19.1 (*)    INR 1.6 (*)    All other components within normal limits  URINALYSIS, ROUTINE W REFLEX MICROSCOPIC - Abnormal; Notable for the following components:   Color, Urine RED (*)    APPearance TURBID (*)    Glucose, UA   (*)    Value: TEST NOT REPORTED DUE TO COLOR INTERFERENCE OF URINE PIGMENT   Hgb urine dipstick   (*)    Value: TEST NOT REPORTED DUE TO COLOR INTERFERENCE OF URINE PIGMENT   Bilirubin Urine   (*)    Value: TEST NOT REPORTED DUE TO COLOR INTERFERENCE OF URINE PIGMENT   Ketones, ur   (*)    Value: TEST NOT REPORTED DUE TO COLOR INTERFERENCE OF URINE PIGMENT   Protein, ur   (*)    Value: TEST NOT REPORTED DUE TO COLOR INTERFERENCE OF URINE PIGMENT   Nitrite   (*)    Value: TEST NOT REPORTED DUE TO COLOR INTERFERENCE OF URINE PIGMENT   Leukocytes,Ua   (*)    Value: TEST NOT REPORTED DUE TO COLOR INTERFERENCE OF URINE PIGMENT   All other components within normal limits  URINALYSIS, MICROSCOPIC (REFLEX) - Abnormal; Notable for the following components:   Bacteria, UA FIELD OBSCURED BY RBC'S (*)    All other components within normal limits  CBG MONITORING, ED - Abnormal; Notable for the following components:   Glucose-Capillary 133 (*)    All other components within normal limits  CULTURE, BLOOD (ROUTINE X 2)  CULTURE, BLOOD (ROUTINE X 2)  URINE CULTURE  APTT  PATHOLOGIST SMEAR REVIEW  TYPE AND SCREEN  PREPARE RBC (CROSSMATCH)    EKG EKG Interpretation  Date/Time:  Monday March 24 2022 19:01:01 EDT Ventricular Rate:  83 PR Interval:  190 QRS Duration: 201 QT Interval:  471 QTC Calculation: 554 R Axis:   -68 Text Interpretation: Sinus or ectopic atrial rhythm Nonspecific  IVCD with LAD LVH with secondary repolarization abnormality similar to Jun 2023 Confirmed by Sherwood Gambler [phone]) on 03/24/2022 7:24:57  PM  Radiology CT ABDOMEN PELVIS W CONTRAST  Result Date: 03/24/2022 CLINICAL DATA:  Concern for PE; nausea and vomiting; gross hematuria EXAM: CT ANGIOGRAPHY CHEST; CT ABDOMEN AND PELVIS WITH CONTRAST TECHNIQUE: Multidetector CT imaging of the chest was performed using the standard protocol during bolus administration of intravenous contrast. Multiplanar CT image reconstructions and MIPs were obtained to evaluate the vascular anatomy. Multidetector CT imaging of the abdomen and pelvis was performed using the standard protocol during bolus administration of intravenous contrast. RADIATION DOSE REDUCTION: This exam was performed according to the departmental dose-optimization program which includes automated exposure control, adjustment of the mA and/or kV according to patient size and/or use of iterative reconstruction technique. CONTRAST:  132m OMNIPAQUE IOHEXOL 350 MG/ML SOLN COMPARISON:  CT abdomen and pelvis 01/24/2022 FINDINGS: CTA CHEST FINDINGS Cardiovascular: Satisfactory opacification of the pulmonary arteries to the segmental level. No evidence of pulmonary embolism. Mild cardiomegaly. No pericardial effusion. Left chest wall pacemaker. CABG. Aortic and coronary artery atherosclerosis. Mediastinum/Nodes: No enlarged mediastinal, hilar, or axillary lymph nodes. Thyroid gland, trachea, and esophagus demonstrate no significant findings. Lungs/Pleura: Diffuse bronchial wall thickening. Scattered mucous plugging greatest in the lower lobes. Clustered nodular opacities greatest in the dependent portions of the upper lobes and in the bilateral lower lobes. Musculoskeletal: Subacute or chronic right rib fractures. Degenerative arthritis both shoulders. Thoracic spondylosis. Review of the MIP images confirms the above findings. CT ABDOMEN and PELVIS FINDINGS Hepatobiliary: No focal liver abnormality is seen. Status post cholecystectomy. No biliary dilatation. Pancreas: Unremarkable. No pancreatic ductal  dilatation or surrounding inflammatory changes. Spleen: Spleen at the upper limits of normal in size. Unchanged cyst or hemangioma in the superior portion of the spleen. Adrenals/Urinary Tract: Adrenals are unremarkable. No urinary calculi or hydronephrosis. Multiple large bilateral renal cysts measuring up to 7.1 cm in the right upper pole. A few cysts are too small to adequately characterize. Cortical scarring left kidney. Foley catheter in the decompressed bladder. Gas within the bladder lumen is presumed related to Foley catheter. Irregular bladder wall thickening. Stomach/Bowel: Gastrostomy tube. Decreased stranding and subcutaneous gas at the site of the gastrostomy since 01/24/2022. Normal caliber large and small bowel. Normal appendix. No bowel wall thickening or adjacent inflammatory change. Vascular/Lymphatic: Aortic atherosclerotic calcification. Redemonstrated low-density in the infrarenal abdominal aorta residual change from aortobifemoral bypass graft. The graft is patent. Unchanged 2.1 cm left common iliac artery aneurysm. Reproductive: Unremarkable. Other: No free intraperitoneal fluid or air. Bilateral fat containing inguinal hernias. Musculoskeletal: Healing fractures in the right sacral ala and posterior right iliac bone. Additional healing fractures of the right superior and inferior pubic rami. No acute osseous abnormality. Review of the MIP images confirms the above findings. IMPRESSION: 1. Negative for acute pulmonary embolism. 2. Bronchial wall thickening, mucous plugging and scattered nodular opacities greatest in the dependent lungs. Findings are compatible with bronchitis/bronchiolitis with a distribution suspicious for aspiration. 3. Irregular bladder wall thickening. This may be due to bladder decompression however cystitis is not excluded. Recommend correlation with urinalysis. 4. Aortic and coronary artery atherosclerotic calcification. 5. 2.1 cm left common iliac artery aneurysm. 6.  Healing fractures of the right superior and inferior pubic rami right sacral ala posterior right iliac bone. Electronically Signed   By: TPlacido SouM.D.   On: 03/24/2022 23:07   CT Angio Chest PE W and/or Wo Contrast  Result Date: 03/24/2022 CLINICAL DATA:  Concern for PE; nausea and vomiting; gross hematuria EXAM: CT ANGIOGRAPHY CHEST; CT ABDOMEN AND PELVIS WITH CONTRAST TECHNIQUE: Multidetector CT imaging of the chest was performed using the standard protocol during bolus administration of intravenous contrast. Multiplanar CT image reconstructions and MIPs were obtained to evaluate the vascular anatomy. Multidetector CT imaging of the abdomen and pelvis was performed using the standard protocol during bolus administration of intravenous contrast. RADIATION DOSE REDUCTION: This exam was performed according to the departmental dose-optimization program which includes automated exposure control, adjustment of the mA and/or kV according to patient size and/or use of iterative reconstruction technique. CONTRAST:  174m OMNIPAQUE IOHEXOL 350 MG/ML SOLN COMPARISON:  CT abdomen and pelvis 01/24/2022 FINDINGS: CTA CHEST FINDINGS Cardiovascular: Satisfactory opacification of the pulmonary arteries to the segmental level. No evidence of pulmonary embolism. Mild cardiomegaly. No pericardial effusion. Left chest wall pacemaker. CABG. Aortic and coronary artery atherosclerosis. Mediastinum/Nodes: No enlarged mediastinal, hilar, or axillary lymph nodes. Thyroid gland, trachea, and esophagus demonstrate no significant findings. Lungs/Pleura: Diffuse bronchial wall thickening. Scattered mucous plugging greatest in the lower lobes. Clustered nodular opacities greatest in the dependent portions of the upper lobes and in the bilateral lower lobes. Musculoskeletal: Subacute or chronic right rib fractures. Degenerative arthritis both shoulders. Thoracic spondylosis. Review of the MIP images confirms the above findings. CT  ABDOMEN and PELVIS FINDINGS Hepatobiliary: No focal liver abnormality is seen. Status post cholecystectomy. No biliary dilatation. Pancreas: Unremarkable. No pancreatic ductal dilatation or surrounding inflammatory changes. Spleen: Spleen at the upper limits of normal in size. Unchanged cyst or hemangioma in the superior portion of the spleen. Adrenals/Urinary Tract: Adrenals are unremarkable. No urinary calculi or hydronephrosis. Multiple large bilateral renal cysts measuring up to 7.1 cm in the right upper pole. A few cysts are too small to adequately characterize. Cortical scarring left kidney. Foley catheter in the decompressed bladder. Gas within the bladder lumen is presumed related to Foley catheter. Irregular bladder wall thickening. Stomach/Bowel: Gastrostomy tube. Decreased stranding and subcutaneous gas at the site of the gastrostomy since 01/24/2022. Normal caliber large and small bowel. Normal appendix. No bowel wall thickening or adjacent inflammatory change. Vascular/Lymphatic: Aortic atherosclerotic calcification. Redemonstrated low-density in the infrarenal abdominal aorta residual change from aortobifemoral bypass graft. The graft is patent. Unchanged 2.1 cm left common iliac artery aneurysm. Reproductive: Unremarkable. Other: No free intraperitoneal fluid or air. Bilateral fat containing inguinal hernias. Musculoskeletal: Healing fractures in the right sacral ala and posterior right iliac bone. Additional healing fractures of the right superior and inferior pubic rami. No acute osseous abnormality. Review of the MIP images confirms the above findings. IMPRESSION: 1. Negative for acute pulmonary embolism. 2. Bronchial wall thickening, mucous plugging and scattered nodular opacities greatest in the dependent lungs. Findings are compatible with bronchitis/bronchiolitis with a distribution suspicious for aspiration. 3. Irregular bladder wall thickening. This may be due to bladder decompression however  cystitis is not excluded. Recommend correlation with urinalysis. 4. Aortic and coronary artery atherosclerotic calcification. 5. 2.1 cm left common iliac artery aneurysm. 6. Healing fractures of the right superior and inferior pubic rami right sacral ala posterior right iliac bone. Electronically Signed   By: TPlacido SouM.D.   On: 03/24/2022 23:07   DG Chest Port 1 View  Result Date: 03/24/2022 CLINICAL DATA:  Sepsis. EXAM: PORTABLE CHEST 1 VIEW COMPARISON:  January 24, 2022. FINDINGS: Stable cardiomediastinal silhouette. Status post coronary bypass graft. Left-sided pacemaker is unchanged in position. No acute pulmonary disease is noted. Bony thorax is unremarkable. IMPRESSION: No  active disease. Electronically Signed   By: Marijo Conception M.D.   On: 03/24/2022 18:38    Procedures Ultrasound ED Peripheral IV (Provider)  Date/Time: 03/24/2022 9:12 PM  Performed by: Sherwood Gambler, MD Authorized by: Sherwood Gambler, MD   Procedure details:    Indications: hypotension and poor IV access     Skin Prep: chlorhexidine gluconate     Location:  Right AC   Angiocath:  20 G   Bedside Ultrasound Guided: Yes     Patient tolerated procedure without complications: Yes     Dressing applied: Yes   .Critical Care  Performed by: Sherwood Gambler, MD Authorized by: Sherwood Gambler, MD   Critical care provider statement:    Critical care time (minutes):  40   Critical care time was exclusive of:  Separately billable procedures and treating other patients   Critical care was necessary to treat or prevent imminent or life-threatening deterioration of the following conditions:  Renal failure and shock   Critical care was time spent personally by me on the following activities:  Development of treatment plan with patient or surrogate, discussions with consultants, evaluation of patient's response to treatment, examination of patient, ordering and review of laboratory studies, ordering and review of  radiographic studies, ordering and performing treatments and interventions, pulse oximetry, re-evaluation of patient's condition and review of old charts     Medications Ordered in ED Medications  ceFEPIme (MAXIPIME) 2 g in sodium chloride 0.9 % 100 mL IVPB (has no administration in time range)  lactated ringers bolus 500 mL (0 mLs Intravenous Stopped 03/24/22 2124)  0.9 %  sodium chloride infusion (10 mL/hr Intravenous New Bag/Given 03/24/22 2309)  ceFEPIme (MAXIPIME) 2 g in sodium chloride 0.9 % 100 mL IVPB (0 g Intravenous Stopped 03/24/22 2200)  metroNIDAZOLE (FLAGYL) IVPB 500 mg (500 mg Intravenous New Bag/Given 03/24/22 2205)  iohexol (OMNIPAQUE) 350 MG/ML injection 100 mL (100 mLs Intravenous Contrast Given 03/24/22 2224)    ED Course/ Medical Decision Making/ A&P Clinical Course as of 03/24/22 2318  Mon Mar 24, 2022  2037 Hgb 6 on CBC and lactate 3.  However I do not know if this is really sepsis.  However family notes that he has a klebsiella UTI and is on bactrim. Will do cefepime/flagyl as we are further investigating. [SG]    Clinical Course User Index [SG] Sherwood Gambler, MD                           Medical Decision Making Amount and/or Complexity of Data Reviewed Labs: ordered.    Details: Lactate 3.5, then down to 2.4. Mild AKI. Hgb 6.0, lower than baseline of ~8 Radiology: ordered and independent interpretation performed.    Details: No pneumonia on CXR. No PE on CTA. No SBO on CT abd ECG/medicine tests: ordered and independent interpretation performed.    Details: chronic bundle branch block.  Risk Prescription drug management. Decision regarding hospitalization.   Patient has had soft but not hypotensive BPs. Give small bolus of IVF given history of CHF. Patient is mildly hypoxic and put on nasal cannula. CTA added on to CT of abd/pelvis to r/o PE, seems to show mild pneumonia. CT without SBO. Seems like he probably has cystitis, and with recent positive urine  culture this could be source of his vomiting.  Has continued and chronic hematuria.  Daughter notes no hematochezia or melena so I think the hematuria is the cause of the  worse anemia.  However it is low enough he needs a blood transfusion which has been started.  Overall he is ill and will need admission.  He is on antibiotics as above.  Discussed with hospitalist, Dr. Claria Dice.        Final Clinical Impression(s) / ED Diagnoses Final diagnoses:  Symptomatic anemia  Acute kidney injury ([place])  Acute respiratory failure with hypoxia [facility])    Rx / DC Orders ED Discharge Orders     None         Sherwood Gambler, MD 03/24/22 2354

## 2022-03-24 NOTE — ED Notes (Signed)
Pt's daughter signed blood consent form as pt is altered at baseline and unable to sign for himself.

## 2022-03-24 NOTE — Telephone Encounter (Signed)
Pt aware she needsto go to hospital

## 2022-03-24 NOTE — Progress Notes (Signed)
Pharmacy Antibiotic Note  [name] is a 88 y.o. male admitted on 03/24/2022 presenting with vomiting, hematuria, concern for IAI.  Pharmacy has been consulted for cefepime dosing.  Flagyl per MD  Plan: Cefepime 2g IV q 12h Monitor renal function, Cx and clinical progression to narrow     Temp (24hrs), Avg:97.7 F (36.5 C), Min:97.7 F (36.5 C), Max:97.7 F (36.5 C)  Recent Labs  Lab 03/18/22 1243 03/24/22 1905 03/24/22 2129  WBC 7.5 8.5  --   CREATININE 0.77 1.34*  --   LATICACIDVEN  --  3.5* 2.4*    Estimated Creatinine Clearance: 41.4 mL/min (A) (by C-G formula based on SCr of 1.34 mg/dL (H)).    Allergies  Allergen Reactions   Penicillins Other (See Comments)    Unknown reaction -- Tolerated Augmentin courses 2019, 2023; Tolerates cephalosporins    Tramadol Other (See Comments)    Dizzy   Ketorolac Tromethamine Rash    Bertis Ruddy, PharmD Clinical Pharmacist ED Pharmacist Phone # [phone] 03/24/2022 10:26 PM

## 2022-03-24 NOTE — Sepsis Progress Note (Signed)
Following per sepsis protocol   

## 2022-03-25 ENCOUNTER — Telehealth: Payer: Self-pay | Admitting: *Deleted

## 2022-03-25 DIAGNOSIS — J690 Pneumonitis due to inhalation of food and vomit: Secondary | ICD-10-CM | POA: Diagnosis present

## 2022-03-25 DIAGNOSIS — E785 Hyperlipidemia, unspecified: Secondary | ICD-10-CM | POA: Diagnosis present

## 2022-03-25 DIAGNOSIS — N3021 Other chronic cystitis with hematuria: Secondary | ICD-10-CM | POA: Diagnosis present

## 2022-03-25 DIAGNOSIS — N179 Acute kidney failure, unspecified: Secondary | ICD-10-CM

## 2022-03-25 DIAGNOSIS — D649 Anemia, unspecified: Secondary | ICD-10-CM | POA: Diagnosis not present

## 2022-03-25 DIAGNOSIS — N3001 Acute cystitis with hematuria: Secondary | ICD-10-CM

## 2022-03-25 DIAGNOSIS — L98429 Non-pressure chronic ulcer of back with unspecified severity: Secondary | ICD-10-CM | POA: Diagnosis present

## 2022-03-25 DIAGNOSIS — E119 Type 2 diabetes mellitus without complications: Secondary | ICD-10-CM | POA: Diagnosis present

## 2022-03-25 DIAGNOSIS — I5022 Chronic systolic (congestive) heart failure: Secondary | ICD-10-CM | POA: Diagnosis present

## 2022-03-25 DIAGNOSIS — R1319 Other dysphagia: Secondary | ICD-10-CM | POA: Diagnosis not present

## 2022-03-25 DIAGNOSIS — J9601 Acute respiratory failure with hypoxia: Secondary | ICD-10-CM

## 2022-03-25 DIAGNOSIS — Z993 Dependence on wheelchair: Secondary | ICD-10-CM | POA: Diagnosis not present

## 2022-03-25 DIAGNOSIS — I2510 Atherosclerotic heart disease of native coronary artery without angina pectoris: Secondary | ICD-10-CM | POA: Diagnosis not present

## 2022-03-25 DIAGNOSIS — L97429 Non-pressure chronic ulcer of left heel and midfoot with unspecified severity: Secondary | ICD-10-CM | POA: Diagnosis present

## 2022-03-25 DIAGNOSIS — L97419 Non-pressure chronic ulcer of right heel and midfoot with unspecified severity: Secondary | ICD-10-CM | POA: Diagnosis present

## 2022-03-25 DIAGNOSIS — Z9189 Other specified personal risk factors, not elsewhere classified: Secondary | ICD-10-CM | POA: Diagnosis not present

## 2022-03-25 DIAGNOSIS — Z9989 Dependence on other enabling machines and devices: Secondary | ICD-10-CM | POA: Diagnosis not present

## 2022-03-25 DIAGNOSIS — I10 Essential (primary) hypertension: Secondary | ICD-10-CM | POA: Diagnosis not present

## 2022-03-25 DIAGNOSIS — R1310 Dysphagia, unspecified: Secondary | ICD-10-CM | POA: Diagnosis not present

## 2022-03-25 DIAGNOSIS — F32A Depression, unspecified: Secondary | ICD-10-CM | POA: Diagnosis present

## 2022-03-25 DIAGNOSIS — I110 Hypertensive heart disease with heart failure: Secondary | ICD-10-CM | POA: Diagnosis present

## 2022-03-25 DIAGNOSIS — G4733 Obstructive sleep apnea (adult) (pediatric): Secondary | ICD-10-CM | POA: Diagnosis not present

## 2022-03-25 DIAGNOSIS — T83031A Leakage of indwelling urethral catheter, initial encounter: Secondary | ICD-10-CM | POA: Diagnosis not present

## 2022-03-25 DIAGNOSIS — D696 Thrombocytopenia, unspecified: Secondary | ICD-10-CM | POA: Diagnosis present

## 2022-03-25 DIAGNOSIS — G9341 Metabolic encephalopathy: Secondary | ICD-10-CM | POA: Diagnosis not present

## 2022-03-25 DIAGNOSIS — Y738 Miscellaneous gastroenterology and urology devices associated with adverse incidents, not elsewhere classified: Secondary | ICD-10-CM | POA: Diagnosis not present

## 2022-03-25 DIAGNOSIS — I480 Paroxysmal atrial fibrillation: Secondary | ICD-10-CM | POA: Diagnosis present

## 2022-03-25 DIAGNOSIS — Z87891 Personal history of nicotine dependence: Secondary | ICD-10-CM | POA: Diagnosis not present

## 2022-03-25 DIAGNOSIS — N3040 Irradiation cystitis without hematuria: Secondary | ICD-10-CM | POA: Diagnosis not present

## 2022-03-25 DIAGNOSIS — Z515 Encounter for palliative care: Secondary | ICD-10-CM | POA: Diagnosis not present

## 2022-03-25 DIAGNOSIS — N3000 Acute cystitis without hematuria: Secondary | ICD-10-CM | POA: Diagnosis present

## 2022-03-25 DIAGNOSIS — B952 Enterococcus as the cause of diseases classified elsewhere: Secondary | ICD-10-CM | POA: Diagnosis present

## 2022-03-25 DIAGNOSIS — J449 Chronic obstructive pulmonary disease, unspecified: Secondary | ICD-10-CM | POA: Diagnosis present

## 2022-03-25 DIAGNOSIS — E43 Unspecified severe protein-calorie malnutrition: Secondary | ICD-10-CM | POA: Diagnosis not present

## 2022-03-25 DIAGNOSIS — Z7189 Other specified counseling: Secondary | ICD-10-CM | POA: Diagnosis not present

## 2022-03-25 DIAGNOSIS — Z1621 Resistance to vancomycin: Secondary | ICD-10-CM | POA: Diagnosis present

## 2022-03-25 DIAGNOSIS — Z8546 Personal history of malignant neoplasm of prostate: Secondary | ICD-10-CM | POA: Diagnosis not present

## 2022-03-25 DIAGNOSIS — E871 Hypo-osmolality and hyponatremia: Secondary | ICD-10-CM | POA: Diagnosis not present

## 2022-03-25 DIAGNOSIS — R319 Hematuria, unspecified: Secondary | ICD-10-CM | POA: Diagnosis not present

## 2022-03-25 DIAGNOSIS — D62 Acute posthemorrhagic anemia: Secondary | ICD-10-CM | POA: Diagnosis present

## 2022-03-25 DIAGNOSIS — Z931 Gastrostomy status: Secondary | ICD-10-CM | POA: Diagnosis not present

## 2022-03-25 DIAGNOSIS — R0602 Shortness of breath: Secondary | ICD-10-CM | POA: Diagnosis not present

## 2022-03-25 DIAGNOSIS — R112 Nausea with vomiting, unspecified: Secondary | ICD-10-CM | POA: Diagnosis not present

## 2022-03-25 DIAGNOSIS — E872 Acidosis, unspecified: Secondary | ICD-10-CM | POA: Diagnosis present

## 2022-03-25 DIAGNOSIS — R627 Adult failure to thrive: Secondary | ICD-10-CM | POA: Diagnosis not present

## 2022-03-25 LAB — HIV ANTIBODY (ROUTINE TESTING W REFLEX): HIV Screen 4th Generation wRfx: NONREACTIVE

## 2022-03-25 LAB — COMPREHENSIVE METABOLIC PANEL
ALT: 24 U/L (ref 0–44)
AST: 27 U/L (ref 15–41)
Albumin: 2.3 g/dL — ABNORMAL LOW (ref 3.5–5.0)
Alkaline Phosphatase: 79 U/L (ref 38–126)
Anion gap: 4 — ABNORMAL LOW (ref 5–15)
BUN: 54 mg/dL — ABNORMAL HIGH (ref 8–23)
CO2: 27 mmol/L (ref 22–32)
Calcium: 9.8 mg/dL (ref 8.9–10.3)
Chloride: 112 mmol/L — ABNORMAL HIGH (ref 98–111)
Creatinine, Ser: 1.24 mg/dL (ref 0.61–1.24)
GFR, Estimated: 56 mL/min — ABNORMAL LOW (ref >60)
Glucose, Bld: 135 mg/dL — ABNORMAL HIGH (ref 70–99)
Potassium: 4.6 mmol/L (ref 3.5–5.1)
Sodium: 143 mmol/L (ref 135–145)
Total Bilirubin: 0.7 mg/dL (ref 0.3–1.2)
Total Protein: 5.1 g/dL — ABNORMAL LOW (ref 6.5–8.1)

## 2022-03-25 LAB — CBC
HCT: 23.9 % — ABNORMAL LOW (ref 39.0–52.0)
Hemoglobin: 7.1 g/dL — ABNORMAL LOW (ref 13.0–17.0)
MCH: 30.6 pg (ref 26.0–34.0)
MCHC: 29.7 g/dL — ABNORMAL LOW (ref 30.0–36.0)
MCV: 103 fL — ABNORMAL HIGH (ref 80.0–100.0)
Platelets: 108 10*3/uL — ABNORMAL LOW (ref 150–400)
RBC: 2.32 MIL/uL — ABNORMAL LOW (ref 4.22–5.81)
RDW: 21.9 % — ABNORMAL HIGH (ref 11.5–15.5)
WBC: 6.2 10*3/uL (ref 4.0–10.5)
nRBC: 0 % (ref 0.0–0.2)

## 2022-03-25 LAB — BLOOD GAS, ARTERIAL
Acid-Base Excess: 3.8 mmol/L — ABNORMAL HIGH (ref 0.0–2.0)
Bicarbonate: 29.2 mmol/L — ABNORMAL HIGH (ref 20.0–28.0)
Drawn by: 59094
O2 Saturation: 97.6 %
Patient temperature: 36.8
pCO2 arterial: 46 mmHg (ref 32–48)
pH, Arterial: 7.41 (ref 7.35–7.45)
pO2, Arterial: 86 mmHg (ref 83–108)

## 2022-03-25 LAB — GLUCOSE, CAPILLARY
Glucose-Capillary: 127 mg/dL — ABNORMAL HIGH (ref 70–99)
Glucose-Capillary: 147 mg/dL — ABNORMAL HIGH (ref 70–99)
Glucose-Capillary: 102 mg/dL — ABNORMAL HIGH (ref 70–99)
Glucose-Capillary: 70 mg/dL (ref 70–99)

## 2022-03-25 LAB — PATHOLOGIST SMEAR REVIEW

## 2022-03-25 MED ORDER — ONDANSETRON HCL 4 MG/2ML IJ SOLN
4.0000 mg | Freq: Four times a day (QID) | INTRAMUSCULAR | Status: DC | PRN
  Administered 2022-04-03: 4 mg via INTRAVENOUS
  Filled 2022-03-25: qty 2

## 2022-03-25 MED ORDER — FERROUS GLUCONATE 324 (38 FE) MG PO TABS
324.0000 mg | ORAL_TABLET | Freq: Every evening | ORAL | Status: DC
  Administered 2022-03-25 – 2022-04-05 (×11): 324 mg via ORAL
  Filled 2022-03-25 (×13): qty 1

## 2022-03-25 MED ORDER — SODIUM CHLORIDE 0.9 % IV SOLN: INTRAVENOUS | Status: DC

## 2022-03-25 MED ORDER — FAMOTIDINE 20 MG PO TABS
20.0000 mg | ORAL_TABLET | Freq: Two times a day (BID) | ORAL | Status: DC
Start: 2022-03-25 — End: 2022-04-07
  Administered 2022-03-25 – 2022-04-06 (×25): 20 mg
  Filled 2022-03-25 (×25): qty 1

## 2022-03-25 MED ORDER — ACETAMINOPHEN 650 MG RE SUPP: 650.0000 mg | Freq: Four times a day (QID) | RECTAL | Status: DC | PRN

## 2022-03-25 MED ORDER — SODIUM CHLORIDE 0.9 % IV BOLUS
500.0000 mL | Freq: Once | INTRAVENOUS | Status: AC
  Administered 2022-03-25: 500 mL via INTRAVENOUS

## 2022-03-25 MED ORDER — LINEZOLID 600 MG/300ML IV SOLN
600.0000 mg | Freq: Two times a day (BID) | INTRAVENOUS | Status: DC
  Administered 2022-03-25 – 2022-03-30 (×12): 600 mg via INTRAVENOUS
  Filled 2022-03-25 (×14): qty 300

## 2022-03-25 MED ORDER — AMIODARONE HCL 200 MG PO TABS
200.0000 mg | ORAL_TABLET | Freq: Every day | ORAL | Status: DC
  Administered 2022-03-25 – 2022-04-06 (×13): 200 mg
  Filled 2022-03-25 (×14): qty 1

## 2022-03-25 MED ORDER — MOMETASONE FURO-FORMOTEROL FUM 200-5 MCG/ACT IN AERO
2.0000 | INHALATION_SPRAY | Freq: Two times a day (BID) | RESPIRATORY_TRACT | Status: DC
  Administered 2022-03-26 – 2022-04-06 (×19): 2 via RESPIRATORY_TRACT
  Filled 2022-03-25 (×2): qty 8.8

## 2022-03-25 MED ORDER — ACETAMINOPHEN 160 MG/5ML PO SOLN
650.0000 mg | Freq: Once | ORAL | Status: AC
  Administered 2022-03-25: 650 mg
  Filled 2022-03-25: qty 20.3

## 2022-03-25 MED ORDER — SENNOSIDES-DOCUSATE SODIUM 8.6-50 MG PO TABS: 1.0000 | ORAL_TABLET | Freq: Every evening | ORAL | Status: DC | PRN

## 2022-03-25 MED ORDER — INSULIN ASPART 100 UNIT/ML IJ SOLN
0.0000 [IU] | INTRAMUSCULAR | Status: DC
  Administered 2022-03-25 – 2022-03-26 (×3): 1 [IU] via SUBCUTANEOUS
  Administered 2022-03-27: 2 [IU] via SUBCUTANEOUS
  Administered 2022-03-27: 1 [IU] via SUBCUTANEOUS
  Administered 2022-03-27: 3 [IU] via SUBCUTANEOUS
  Administered 2022-03-27 (×3): 2 [IU] via SUBCUTANEOUS
  Administered 2022-03-28 (×3): 3 [IU] via SUBCUTANEOUS
  Administered 2022-03-28: 1 [IU] via SUBCUTANEOUS
  Administered 2022-03-28 – 2022-03-29 (×4): 2 [IU] via SUBCUTANEOUS
  Administered 2022-03-29: 1 [IU] via SUBCUTANEOUS
  Administered 2022-03-29: 2 [IU] via SUBCUTANEOUS
  Administered 2022-03-29: 1 [IU] via SUBCUTANEOUS
  Administered 2022-03-30 (×3): 2 [IU] via SUBCUTANEOUS
  Administered 2022-03-30: 3 [IU] via SUBCUTANEOUS
  Administered 2022-03-30 (×2): 2 [IU] via SUBCUTANEOUS
  Administered 2022-03-31: 1 [IU] via SUBCUTANEOUS
  Administered 2022-04-01: 2 [IU] via SUBCUTANEOUS
  Administered 2022-04-01 (×2): 1 [IU] via SUBCUTANEOUS
  Administered 2022-04-01 – 2022-04-02 (×3): 2 [IU] via SUBCUTANEOUS
  Administered 2022-04-02: 1 [IU] via SUBCUTANEOUS
  Administered 2022-04-02: 2 [IU] via SUBCUTANEOUS
  Administered 2022-04-03: 1 [IU] via SUBCUTANEOUS
  Administered 2022-04-03: 2 [IU] via SUBCUTANEOUS
  Administered 2022-04-03: 1 [IU] via SUBCUTANEOUS
  Administered 2022-04-03 – 2022-04-04 (×4): 2 [IU] via SUBCUTANEOUS
  Administered 2022-04-04: 3 [IU] via SUBCUTANEOUS
  Administered 2022-04-04: 1 [IU] via SUBCUTANEOUS
  Administered 2022-04-05: 2 [IU] via SUBCUTANEOUS
  Administered 2022-04-05: 3 [IU] via SUBCUTANEOUS
  Administered 2022-04-05: 2 [IU] via SUBCUTANEOUS
  Administered 2022-04-05: 5 [IU] via SUBCUTANEOUS
  Administered 2022-04-06: 3 [IU] via SUBCUTANEOUS
  Administered 2022-04-06: 2 [IU] via SUBCUTANEOUS

## 2022-03-25 MED ORDER — ONDANSETRON HCL 4 MG PO TABS: 4.0000 mg | ORAL_TABLET | Freq: Four times a day (QID) | ORAL | Status: DC | PRN

## 2022-03-25 MED ORDER — DOXAZOSIN MESYLATE 2 MG PO TABS
1.0000 mg | ORAL_TABLET | Freq: Every day | ORAL | Status: DC
  Administered 2022-03-25 – 2022-04-06 (×13): 1 mg
  Filled 2022-03-25 (×14): qty 1

## 2022-03-25 MED ORDER — INSULIN ASPART 100 UNIT/ML IJ SOLN: 0.0000 [IU] | Freq: Every day | INTRAMUSCULAR | Status: DC

## 2022-03-25 MED ORDER — FREE WATER
100.0000 mL | Freq: Three times a day (TID) | Status: DC
  Administered 2022-03-25 – 2022-03-26 (×4): 100 mL

## 2022-03-25 MED ORDER — APIXABAN 5 MG PO TABS
5.0000 mg | ORAL_TABLET | Freq: Two times a day (BID) | ORAL | Status: DC
Start: 2022-03-25 — End: 2022-03-25

## 2022-03-25 MED ORDER — ACETAMINOPHEN 325 MG PO TABS
650.0000 mg | ORAL_TABLET | Freq: Four times a day (QID) | ORAL | Status: DC | PRN
  Administered 2022-03-25 – 2022-04-03 (×8): 650 mg via ORAL
  Filled 2022-03-25 (×8): qty 2

## 2022-03-25 MED ORDER — ZINC OXIDE 12.8 % EX OINT
TOPICAL_OINTMENT | Freq: Two times a day (BID) | CUTANEOUS | Status: DC
  Administered 2022-03-25: 1 via TOPICAL
  Filled 2022-03-25 (×3): qty 56.7

## 2022-03-25 MED ORDER — FREE WATER: 100.0000 mL | Status: DC

## 2022-03-25 NOTE — Telephone Encounter (Signed)
I have messaged the inpatient admitting doctor regarding concerns about [name].  I agree that at minimum, Palliative care consult is worth pursuing.  Hopefully, his family will be open to this in the hospital and his admitting team will be able to coordinate
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
## 2022-03-25 NOTE — Progress Notes (Signed)
Progress Note   Patient: [name] WLN:[id number] DOB: 03-03-1933 DOA: 03/24/2022     0 DOS: the patient was seen and examined on 03/25/2022   Brief hospital course: 88 y.o. male with medical history significant of intolerance of CPAP, PAF, CAD, remote history of prostate cancer, dyslipidemia, hypertension.  He was in good health until February 2023. Unfortunately in February 2023 he sustained a pelvic fracture and has been chair bound since.  12/2021 he had a PEG placed secondary to poor p.o. intake.  01/24/2022 he had cellulitis and infection around the PEG site. 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  This was initially treated with 7 days of p.o. linezolid, hematuria resolved only to return shortly after completion of linezolid.  Urology ordered with easily to be completed for 21 days.  Hematuria resolved, patient unfortunate developed urinary retention, Foley replaced.  Two days later hematuria returned and has persisted.    States the patient has a G-tube, nutrition is Jevity at 90 cc per an hour.  He also was given 140 cc of free water every 4 hours.  Yesterday he had an episode of projectile nausea and vomiting.  His tube feeds was held for 15 hours.  The next day to be resumed around lunch, within 1 hour he again had a projectile emesis.  His family is concerned for bowel obstruction.  He was seen in the ER.  There is no reports of fever or chills  In the ER patient current hemoglobin was 6.  He has been transfused 1 unit packed red blood cell.  Patient still with hematuria.  Assessment and Plan: Aspiration pneumonia/lactic acidosis -Chest ct reviewed, findings consistent with aspiration PNA -f/u blood cx -Aspiration precaution, head of bed 30 degrees -Continue IV cefepime and linezolid -wean O2 as tolerated -Will order SLP   Hematuria likely secondary acute on chronic cystitis -Recently treated as outpt for VRE on linezolid, conitnued for now -Urine  cx on 7/25 with klebsiella resistant to ampicillin and tetracycline, sensitive to all others -continue indwelling foley -Remains with gross blood per foley. Consulted Urology who will see   Anemia secondary to acute hemorrhage and chronic disease -Patient transfused 1 unit packed red blood cells in ER. -Repeat CBC   Hypokalemia, ruled out -K was normal at time of presentation   Thrombocytopenia -Chronic -Noted to be 108 today. Will repeat CBC in AM   Failure to thrive -consulted dietitian -Already has PEG. Awaiting SLP to clear PO given concerns of aspiration   Sacral/heel decubitus -Air mattress ordered at time of presentation   CAD -history of CABG 09/2010. Cath 03/2014.  Recent echo 01/2022 EF 40 to 45% -Stable, home meds resumed   General: Failure to thrive.  Patient with progressive decline.  Per admitting physican, family aware but wishes patient to remain full code at this point. -Message from pt's PCP, Dr. Lajuana Ripple noted and appreciated. Poor overall prognosis noted -Agree with Palliative Consultation while here. Have consulted Palliative Care to establish goals of care       Subjective: Confused, difficult to assess  Physical Exam: Vitals:   03/25/22 0239 03/25/22 0900 03/25/22 1100 03/25/22 1654  BP: (!) 104/52 (!) 111/54 (!) 114/48 (!) 112/56  Pulse: 79 71 63 68  Resp: '20 20 20 19  '$ Temp: 98.2 F (36.8 C) 97.7 F (36.5 C)  97.8 F (36.6 C)  TempSrc: Oral Axillary  Axillary  SpO2: 95% 99%  98%  Weight:      Height:  General exam: Awake, laying in bed, in nad Respiratory system: Normal respiratory effort, no wheezing Cardiovascular system: regular rate, s1, s2 Gastrointestinal system: Soft, nondistended, positive BS Central nervous system: CN2-12 grossly intact, strength intact Extremities: Perfused, no clubbing Skin: Normal skin turgor, no notable skin lesions seen Psychiatry: Unable to assess given mentation  Data Reviewed:  Labs reviewed: Na  143, K 4.6, Cr 1.24, Hgb 7.1, Plt 108  Family Communication: Pt in room, family not at bedside  Disposition: Status is: Observation The patient will require care spanning > 2 midnights and should be moved to inpatient because: Severity of illness  Planned Discharge Destination:  Unclear at this time    Author: Marylu Lund, MD 03/25/2022 6:29 PM  For on call review [email or web address].

## 2022-03-25 NOTE — Telephone Encounter (Signed)
[facility] clincal mgr- Yvone Neu called:   All [facility] caregivers are concerned with this pt. They all state that he is not responding to the care they are giving and that they may have to pull their care coverage due to pt not improving (due to [facility] guidelines). He wanted to give Dr Darnell Level an update and maybe have someone reach out to try and help the family understand the degree of care needed at this time.   Occupation therapist went - pt had significant amt of blood in cath and daughter was insistent that this is normal, and happens for him.  She did end up taking him to [facility] where they are currently waiting on a bed.   Family is resistant to hospice and wouldn't let them come in Maybe palliative care would be better. Comfort measures seems to be what he needs right now (per [facility])   All [facility] caregivers (3 nurses, PT, OT)  agree that they are not making progress. He needs a higher level of care.   [phone]- [facility] clinical mgr - he states we can call him anytime to discuss further if needed.

## 2022-03-25 NOTE — TOC Initial Note (Signed)
Transition of Care [facility]) - Initial/Assessment Note    Patient Details  Name: [name] MRN: [id number] Date of Birth: [birth year]  Transition of Care [facility]) CM/SW Contact:    Marilu Favre, RN Phone Number: 03/25/2022, 3:10 PM  Clinical Narrative:                  Spoke to patient's daughter Neil Crouch.   Patient from home with family.   Patient has wheel chair , air over lay mattress , tube feeds and tube feeding pump at home. They returned hoyer lift. They have been using gait bait for transfers.   Pam with [facility] aware of admission. [facility] just refilled his Jevity on 03/07/22.   Patient has been using wheel chair transport through [place] 800 [phone] , family has to give three day notice for transportation to doctor appointments.   Patient was active with [facility] for home health services through [facility] office. [facility] unable to continue services. See prior notes.   NCM asked [facility] to have Yvone Neu with [facility] ,call [name] to explain. [name] spoke to [place] today and stated Yvone Neu did not tell her [facility] could not accept her dad back. NCM confirmed cell number with [name]. Cory with [facility] will ask Yvone Neu to call [name] on her cell to discuss.   On last discharge home patient required ambulance transport.    Transition of Care Department [facility])  will continue to monitor patient advancement through interdisciplinary progression rounds. If new patient transition needs arise, please place a TOC consult.         Patient Goals and CMS Choice     Choice offered to / list presented to : Adult Children  Expected Discharge Plan and Services     Discharge Planning Services: CM Consult   Living arrangements for the past 2 months: [place]                   DME Agency: NA                  Prior Living Arrangements/Services Living arrangements for the past 2 months: Single Family Home Lives with:: Adult Children Patient language and need for  interpreter reviewed:: Yes Do you feel safe going back to the place where you live?: Yes      Need for Family Participation in Patient Care: Yes (Comment) Care giver support system in place?: Yes (comment) Current home services: DME Criminal Activity/Legal Involvement Pertinent to Current Situation/Hospitalization: No - Comment as needed  Activities of Daily Living      Permission Sought/Granted                  Emotional Assessment Appearance:: Appears stated age       Alcohol / Substance Use: Not Applicable Psych Involvement: No (comment)  Admission diagnosis:  Acute cystitis [N30.00] Acute respiratory failure with hypoxia ([place]) [J96.01] Acute kidney injury ([place]) [N17.9] Symptomatic anemia [D64.9] Patient Active Problem List   Diagnosis Date Noted   Acute cystitis 03/25/2022   Acute respiratory failure with hypoxia ([place])    Aspiration pneumonia of both lungs due to gastric secretions ([place])    Hematuria 02/04/2022   Hypernatremia 01/28/2022   Goals of care, counseling/discussion 01/27/2022   Delirium 01/27/2022   Pressure ulcer 01/25/2022   Symptomatic anemia 01/25/2022   Cellulitis 01/24/2022   Dysphagia 01/24/2022   Status post insertion of percutaneous endoscopic gastrostomy (PEG) tube ([place]) 01/24/2022   Urinary retention 01/24/2022  Hypotension 01/24/2022   Diarrhea 01/24/2022   Abnormal LFTs 01/24/2022   Protein-calorie malnutrition, severe 01/07/2022   Acute encephalopathy 01/06/2022   Acute kidney injury superimposed on chronic kidney disease ([place]) 01/06/2022   C6 cervical fracture ([place]) 01/06/2022   History of pelvic hematoma 01/06/2022   Dysarthria 01/06/2022   Closed fracture of multiple pubic rami, right, sequela 54/04/8118   Complicated UTI (urinary tract infection) 06/23/2021   HFrEF (heart failure with reduced ejection fraction) ([place]) 06/23/2021   Acute kidney injury ([place]) 06/23/2021   COVID-19 virus RNA test result positive at limit of  detection 02/01/2021   Kidney stones 07/04/2020   Hyperlipidemia associated with type 2 diabetes mellitus ([place]) 07/05/2019   Paroxysmal atrial fibrillation ([place]) 06/29/2019   Acquired thrombophilia ([place]) 06/29/2019   Second degree Mobitz II AV block 02/22/2019   B12 deficiency 12/18/2017   Status post right knee replacement 11/10/2016   S/P repair of abdominal aortic aneurysm using bifurcation graft 10/03/2016   Mixed incontinence 04/03/2015   PVC's (premature ventricular contractions) 06/05/2014   BMI 29.0-29.9,adult 14/78/2956   Diastolic dysfunction- grade 1 by echo June 2015, EF 50% 03/28/2014   COPD (chronic obstructive pulmonary disease) ([place]) 02/21/2014   OSA (obstructive sleep apnea) 12/07/2013   Sinus bradycardia- ? symptomatic 05/17/2013   Coronary artery disease involving nonautologous biological coronary bypass graft without angina pectoris    Thrombocytopenia (HCC)    S/P CABG x 3 08/21/2011   Hypertensive cardiovascular disease    Prostate cancer ([place])    Hyperlipidemia with target LDL less than 100    Controlled type 2 diabetes mellitus without complication, without long-term current use of insulin ([place]) 11/28/2010   PCP:  [name], DO Pharmacy:   [id number] [place], [id number]: [phone]: [phone] [facility] [place] [address]Phone: [phone]: [phone] [facility] [place]- [address]Phone: [phone]: [phone]    Social Determinants of Health (SDOH) Interventions    Readmission Risk Interventions    01/16/2022    2:12 PM  Readmission Risk Prevention Plan  Transportation Screening Complete  Medication Review ([place] Referral to Pharmacy  PCP or  Specialist appointment within 3-5 days of discharge Complete  HRI or HDikeComplete  SW Recovery Care/Counseling Consult Complete  Palliative Care Screening Complete  [place] Applicable

## 2022-03-25 NOTE — Telephone Encounter (Signed)
TC from [facility] w/ [name] Pt currently admitted at [facility], recommendation is at least a Palliative consult. They will be looking at discharging him from their services since they have done all they can do. If family is not on board w/ Palliative services, may need to look at another [facility] agency that services [place]

## 2022-03-25 NOTE — Progress Notes (Signed)
Patient arrived to unit with gross hematuria in foley. Yellow mews due to BP and LOC. MD notified.

## 2022-03-25 NOTE — Hospital Course (Addendum)
88 y.o. male with medical history significant of intolerance of CPAP, PAF, CAD, remote history of prostate cancer, dyslipidemia, hypertension.  He was in good health until February 2023. Unfortunately in February 2023 he sustained a pelvic fracture and has been chair bound since.  12/2021 he had a PEG placed secondary to poor p.o. intake.  01/24/2022 he had cellulitis and infection around the PEG site. 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  This was initially treated with 7 days of p.o. linezolid, hematuria resolved only to return shortly after completion of linezolid.  Urology ordered with easily to be completed for 21 days.  Hematuria resolved, patient unfortunate developed urinary retention, Foley replaced.  Two days later hematuria returned and has persisted.    States the patient has a G-tube, nutrition is Jevity at 90 cc per an hour.  He also was given 140 cc of free water every 4 hours.  Yesterday he had an episode of projectile nausea and vomiting.  His tube feeds was held for 15 hours.  The next day to be resumed around lunch, within 1 hour he again had a projectile emesis.  His family is concerned for bowel obstruction.  He was seen in the ER.  There is no reports of fever or chills  In the ER patient current hemoglobin was 6.  He has been transfused 1 unit packed red blood cell.  Patient still with hematuria.  **Interim History Patient's hematuria was evaluated by urology who took the patient for cystoscopy with fulguration given that he had recurrent hemorrhagic radiation cystitis associated VRE.  His bleeding was able to be stopped and he was continued on linezolid.  Patient sound junky today so we have initiated Xopenex and Atrovent.  Palliative care was consulted for goals of care discussion.  Currently family is not interested in hospice care at this time and patient remains at high risk for further decompensation given his bedbound status, dysphagia and  cognitive changes.  SLP evaluated and planning for MBS however was not able to be done given that the patient was nauseous will defer till tomorrow.  PT reevaluated recommending home health.  ID was consulted and recommending discontinuing antibiotics and observing overnight to monitor any fevers.  Repeat chest x-ray done and showed subtle increased patchy haziness in the left lower lung concerning for pneumonia but clinically he is improving and is breath sounds are improving.  Patient is getting close to being discharged but family continues to request the MBS to see if he can be fed however I doubt he will be able to safely swallow.  Currently he is not at goal for his tube feedings and will be increasing to goal rate hopefully by tomorrow.  After discussion with ID and urology will place him on prophylactic antibiotics with Macrobid 50 mg nightly.
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
## 2022-03-26 DIAGNOSIS — Z7189 Other specified counseling: Secondary | ICD-10-CM | POA: Diagnosis not present

## 2022-03-26 DIAGNOSIS — R627 Adult failure to thrive: Secondary | ICD-10-CM

## 2022-03-26 DIAGNOSIS — D649 Anemia, unspecified: Secondary | ICD-10-CM | POA: Diagnosis not present

## 2022-03-26 DIAGNOSIS — Z515 Encounter for palliative care: Secondary | ICD-10-CM | POA: Diagnosis not present

## 2022-03-26 DIAGNOSIS — R319 Hematuria, unspecified: Secondary | ICD-10-CM

## 2022-03-26 DIAGNOSIS — N179 Acute kidney failure, unspecified: Secondary | ICD-10-CM | POA: Diagnosis not present

## 2022-03-26 DIAGNOSIS — J9601 Acute respiratory failure with hypoxia: Secondary | ICD-10-CM | POA: Diagnosis not present

## 2022-03-26 DIAGNOSIS — N3001 Acute cystitis with hematuria: Secondary | ICD-10-CM | POA: Diagnosis not present

## 2022-03-26 LAB — CBC WITH DIFFERENTIAL/PLATELET
Abs Immature Granulocytes: 0.03 10*3/uL (ref 0.00–0.07)
Basophils Absolute: 0 10*3/uL (ref 0.0–0.1)
Basophils Relative: 0 %
Eosinophils Absolute: 0 10*3/uL (ref 0.0–0.5)
Eosinophils Relative: 0 %
HCT: 21.1 % — ABNORMAL LOW (ref 39.0–52.0)
Hemoglobin: 6.1 g/dL — CL (ref 13.0–17.0)
Immature Granulocytes: 1 %
Lymphocytes Relative: 34 %
Lymphs Abs: 1.6 10*3/uL (ref 0.7–4.0)
MCH: 30 pg (ref 26.0–34.0)
MCHC: 28.9 g/dL — ABNORMAL LOW (ref 30.0–36.0)
MCV: 103.9 fL — ABNORMAL HIGH (ref 80.0–100.0)
Monocytes Absolute: 0.4 10*3/uL (ref 0.1–1.0)
Monocytes Relative: 9 %
Neutro Abs: 2.6 10*3/uL (ref 1.7–7.7)
Neutrophils Relative %: 56 %
Platelets: 88 10*3/uL — ABNORMAL LOW (ref 150–400)
RBC: 2.03 MIL/uL — ABNORMAL LOW (ref 4.22–5.81)
RDW: 21.1 % — ABNORMAL HIGH (ref 11.5–15.5)
WBC: 4.6 10*3/uL (ref 4.0–10.5)
nRBC: 0 % (ref 0.0–0.2)

## 2022-03-26 LAB — URINE CULTURE: Culture: 1000 — AB

## 2022-03-26 LAB — BASIC METABOLIC PANEL
Anion gap: 4 — ABNORMAL LOW (ref 5–15)
BUN: 40 mg/dL — ABNORMAL HIGH (ref 8–23)
CO2: 27 mmol/L (ref 22–32)
Calcium: 9.1 mg/dL (ref 8.9–10.3)
Chloride: 111 mmol/L (ref 98–111)
Creatinine, Ser: 1.02 mg/dL (ref 0.61–1.24)
GFR, Estimated: >60 mL/min (ref >60)
Glucose, Bld: 140 mg/dL — ABNORMAL HIGH (ref 70–99)
Potassium: 4.3 mmol/L (ref 3.5–5.1)
Sodium: 142 mmol/L (ref 135–145)

## 2022-03-26 LAB — GLUCOSE, CAPILLARY
Glucose-Capillary: 167 mg/dL — ABNORMAL HIGH (ref 70–99)
Glucose-Capillary: 104 mg/dL — ABNORMAL HIGH (ref 70–99)
Glucose-Capillary: 109 mg/dL — ABNORMAL HIGH (ref 70–99)
Glucose-Capillary: 98 mg/dL (ref 70–99)
Glucose-Capillary: 113 mg/dL — ABNORMAL HIGH (ref 70–99)
Glucose-Capillary: 130 mg/dL — ABNORMAL HIGH (ref 70–99)

## 2022-03-26 LAB — HEMOGLOBIN AND HEMATOCRIT, BLOOD
HCT: 24.4 % — ABNORMAL LOW (ref 39.0–52.0)
HCT: 25.5 % — ABNORMAL LOW (ref 39.0–52.0)
Hemoglobin: 7.1 g/dL — ABNORMAL LOW (ref 13.0–17.0)
Hemoglobin: 7.6 g/dL — ABNORMAL LOW (ref 13.0–17.0)

## 2022-03-26 LAB — PREPARE RBC (CROSSMATCH)

## 2022-03-26 MED ORDER — VENLAFAXINE HCL 37.5 MG PO TABS
37.5000 mg | ORAL_TABLET | Freq: Every day | ORAL | Status: DC
Start: 2022-03-26 — End: 2022-04-07
  Administered 2022-03-26 – 2022-04-05 (×11): 37.5 mg
  Filled 2022-03-26 (×12): qty 1

## 2022-03-26 MED ORDER — SODIUM CHLORIDE 0.9% IV SOLUTION: Freq: Once | INTRAVENOUS | Status: AC

## 2022-03-26 MED ORDER — JEVITY 1.5 CAL/FIBER PO LIQD
1000.0000 mL | ORAL | Status: DC
Start: 2022-03-26 — End: 2022-04-01
  Administered 2022-03-26 – 2022-03-31 (×7): 1000 mL
  Filled 2022-03-26 (×10): qty 1000

## 2022-03-26 MED ORDER — FREE WATER
150.0000 mL | Status: DC
Start: 2022-03-26 — End: 2022-04-07
  Administered 2022-03-26 – 2022-04-06 (×64): 150 mL

## 2022-03-26 MED ORDER — JUVEN PO PACK
1.0000 | PACK | Freq: Two times a day (BID) | ORAL | Status: DC
  Administered 2022-03-27 – 2022-04-01 (×9): 1
  Filled 2022-03-26 (×12): qty 1

## 2022-03-26 MED ORDER — DEXTROSE 50 % IV SOLN
12.5000 g | INTRAVENOUS | Status: AC
  Administered 2022-03-26: 12.5 g via INTRAVENOUS
  Filled 2022-03-26: qty 50
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
## 2022-03-26 NOTE — Progress Notes (Signed)
Initial Nutrition Assessment  DOCUMENTATION CODES:   Severe malnutrition in context of chronic illness  INTERVENTION:  TF via g-tube: Jevity 1.5 @ 35m/g (1.44L/d) Start at 40 and increase by 10 q4h to goal of 612mh 15063mree water q4h This regimen provides 2160kcal, 92g of protein, and 1994m20m free water 1 packet Juven BID, each packet provides 95 calories, 2.5 grams of protein (collagen), and 9.8 grams of carbohydrate (3 grams sugar); also contains 7 grams of L-arginine and L-glutamine, 300 mg vitamin C, 15 mg vitamin E, 1.2 mcg vitamin B-12, 9.5 mg zinc, 200 mg calcium, and 1.5 g  Calcium Beta-hydroxy-Beta-methylbutyrate to support wound healing   NUTRITION DIAGNOSIS:   Severe Malnutrition related to chronic illness (dysphagia) as evidenced by severe fat depletion, severe muscle depletion.  GOAL:   Patient will meet greater than or equal to 90% of their needs  MONITOR:   Diet advancement, TF tolerance  REASON FOR ASSESSMENT:   Consult Assessment of nutrition requirement/status  ASSESSMENT:   Pt with hx of PAF, CAD, DM type 2, CVA, GERD, COPD, remote history of prostate cancer, dyslipidemia, hypertension, and recurrent UTIs presented to ED with hematuria and vomiting. G-tube in place for hx of dysphagia.  Well known to RD from last admission  Met with pt and daughter in room. TF had been well tolerated at home until the days leading up to admission. Has continued on cyclic feeds at night 90mL74BS5h of Jevity 1.5. Previously doing 120mL20me water q4h while feeds infusing and 100mL 60mduring the day for hydration. Recently was advised to increase free water by 500mL/d64mcardiologist due to dehydration. Will aim to meet ~ 2L of fluid here and adjust as needed.   States that Juven BID has been continued and wound on abdomen is almost completely healed.   Nutritionally Relevant Medications: Scheduled Meds:  doxazosin  1 mg Per Tube Daily   famotidine  20 mg Per Tube  BID   ferrous gluconate  324 mg Oral QPM   free water  100 mL Per Tube Q8H   insulin aspart  0-5 Units Subcutaneous QHS   insulin aspart  0-9 Units Subcutaneous Q4H   Continuous Infusions:  sodium chloride 75 mL/hr at 03/25/22 2221   ceFEPime (MAXIPIME) IV 2 g (03/25/22 2252)   linezolid (ZYVOX) IV 600 mg (03/25/22 2346)   Labs Reviewed  NUTRITION - FOCUSED PHYSICAL EXAM:  Flowsheet Row Most Recent Value  Orbital Region Severe depletion  Upper Arm Region Moderate depletion  Thoracic and Lumbar Region Moderate depletion  Buccal Region Severe depletion  Temple Region Severe depletion  Clavicle Bone Region Moderate depletion  Clavicle and Acromion Bone Region Severe depletion  Scapular Bone Region Moderate depletion  Dorsal Hand Unable to assess  [mittens]  Patellar Region Severe depletion  Anterior Thigh Region Severe depletion  Posterior Calf Region Severe depletion  Edema (RD Assessment) Mild  Hair Reviewed  Eyes Reviewed  Mouth Reviewed  Skin Reviewed  Nails Reviewed   Diet Order:   Diet Order             Diet NPO time specified  Diet effective now                   EDUCATION NEEDS:   Not appropriate for education at this time  Skin:  Skin Assessment: Reviewed RN Assessment  Last BM:  unsure  Height:   Ht Readings from Last 1 Encounters:  03/24/22 5' 11" (1.803 m)  Weight:   Wt Readings from Last 1 Encounters:  03/24/22 80 kg    Ideal Body Weight:  78.2 kg  BMI:  Body mass index is 24.6 kg/m.  Estimated Nutritional Needs:   Kcal:  2000-2200 kcal/d  Protein:  95-110g/d  Fluid:  2-2.2L.d    Ranell Patrick, RD, LDN Clinical Dietitian RD pager # available in [place]  After hours/weekend pager # available in [facility]

## 2022-03-26 NOTE — Progress Notes (Signed)
Pharmacy Antibiotic Note  [name] is a  88 y.o. 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 Pharmacy has been consulted for cefepime dosing.  Plan: Cefepime 2g Q12h  Linezolid '600mg'$  Q12h per MD  Trend WBC, fever, renal function F/u cultures, clinical progress, levels as indicated De-escalate when able   Height: '5\' 11"'$  (180.3 cm) Weight: 80 kg (176 lb 5.9 oz) IBW/kg (Calculated) : 75.3  Temp (24hrs), Avg:97.7 F (36.5 C), Min:97.6 F (36.4 C), Max:97.8 F (36.6 C)  Recent Labs  Lab 03/24/22 1905 03/24/22 2129 03/25/22 1057 03/26/22 0340  WBC 8.5  --  6.2 4.6  CREATININE 1.34*  --  1.24 1.02  LATICACIDVEN 3.5* 2.4*  --   --     Estimated Creatinine Clearance: 54.3 mL/min (by C-G formula based on SCr of 1.02 mg/dL).    Allergies  Allergen Reactions   Penicillins Other (See Comments)    Unknown reaction -- Tolerated Augmentin courses 2019, 2023; Tolerates cephalosporins    Tramadol Other (See Comments)    Dizzy   Ketorolac Tromethamine Rash    Antimicrobials this admission: Flagyl 7/31 x1 Cefepime 7/31 >>  Linezolid 8/01 >>    Microbiology results: 7/31 BCx: ngtd 7/25 Ucx: klebsiella pneumoniae (100k, panS) 7/31 UCx: pending    Thank you for allowing pharmacy to be a part of this patient's care.  Ardyth Harps, PharmD Clinical Pharmacist

## 2022-03-26 NOTE — Plan of Care (Signed)
Problem: Education: Goal: Knowledge of General Education information will improve Description: Including pain rating scale, medication(s)/side effects and non-pharmacologic comfort measures Outcome: Progressing   Problem: Health Behavior/Discharge Planning: Goal: Ability to manage health-related needs will improve Outcome: Progressing   Problem: Clinical Measurements: Goal: Ability to maintain clinical measurements within normal limits will improve Outcome: Progressing Goal: Will remain free from infection Outcome: Progressing Goal: Diagnostic test results will improve Outcome: Progressing Goal: Respiratory complications will improve Outcome: Progressing Goal: Cardiovascular complication will be avoided Outcome: Progressing   Problem: Activity: Goal: Risk for activity intolerance will decrease Outcome: Progressing   Problem: Nutrition: Goal: Adequate nutrition will be maintained Outcome: Progressing   Problem: Coping: Goal: Level of anxiety will decrease Outcome: Progressing   Problem: Elimination: Goal: Will not experience complications related to bowel motility Outcome: Progressing Goal: Will not experience complications related to urinary retention Outcome: Progressing   Problem: Pain Managment: Goal: General experience of comfort will improve Outcome: Progressing   Problem: Safety: Goal: Ability to remain free from injury will improve Outcome: Progressing   Problem: Skin Integrity: Goal: Risk for impaired skin integrity will decrease Outcome: Progressing   Problem: Education: Goal: Ability to describe self-care measures that may prevent or decrease complications (Diabetes Survival Skills Education) will improve Outcome: Progressing Goal: Individualized Educational Video(s) Outcome: Progressing   Problem: Coping: Goal: Ability to adjust to condition or change in health will improve Outcome: Progressing   Problem: Fluid Volume: Goal: Ability to  maintain a balanced intake and output will improve Outcome: Progressing   Problem: Health Behavior/Discharge Planning: Goal: Ability to identify and utilize available resources and services will improve Outcome: Progressing Goal: Ability to manage health-related needs will improve Outcome: Progressing   Problem: Metabolic: Goal: Ability to maintain appropriate glucose levels will improve Outcome: Progressing   Problem: Nutritional: Goal: Maintenance of adequate nutrition will improve Outcome: Progressing Goal: Progress toward achieving an optimal weight will improve Outcome: Progressing   Problem: Skin Integrity: Goal: Risk for impaired skin integrity will decrease Outcome: Progressing   Problem: Tissue Perfusion: Goal: Adequacy of tissue perfusion will improve Outcome: Progressing   Problem: Education: Goal: Knowledge of General Education information will improve Description: Including pain rating scale, medication(s)/side effects and non-pharmacologic comfort measures Outcome: Progressing   Problem: Health Behavior/Discharge Planning: Goal: Ability to manage health-related needs will improve Outcome: Progressing   Problem: Clinical Measurements: Goal: Ability to maintain clinical measurements within normal limits will improve Outcome: Progressing Goal: Will remain free from infection Outcome: Progressing Goal: Diagnostic test results will improve Outcome: Progressing Goal: Respiratory complications will improve Outcome: Progressing Goal: Cardiovascular complication will be avoided Outcome: Progressing   Problem: Activity: Goal: Risk for activity intolerance will decrease Outcome: Progressing   Problem: Nutrition: Goal: Adequate nutrition will be maintained Outcome: Progressing   Problem: Coping: Goal: Level of anxiety will decrease Outcome: Progressing   Problem: Elimination: Goal: Will not experience complications related to bowel motility Outcome:  Progressing Goal: Will not experience complications related to urinary retention Outcome: Progressing   Problem: Pain Managment: Goal: General experience of comfort will improve Outcome: Progressing   Problem: Safety: Goal: Ability to remain free from injury will improve Outcome: Progressing   Problem: Skin Integrity: Goal: Risk for impaired skin integrity will decrease Outcome: Progressing   Problem: Education: Goal: Ability to describe self-care measures that may prevent or decrease complications (Diabetes Survival Skills Education) will improve Outcome: Progressing Goal: Individualized Educational Video(s) Outcome: Progressing   Problem: Coping: Goal: Ability to adjust to condition or change   in health will improve Outcome: Progressing   Problem: Fluid Volume: Goal: Ability to maintain a balanced intake and output will improve Outcome: Progressing   Problem: Health Behavior/Discharge Planning: Goal: Ability to identify and utilize available resources and services will improve Outcome: Progressing Goal: Ability to manage health-related needs will improve Outcome: Progressing   Problem: Metabolic: Goal: Ability to maintain appropriate glucose levels will improve Outcome: Progressing   Problem: Nutritional: Goal: Maintenance of adequate nutrition will improve Outcome: Progressing Goal: Progress toward achieving an optimal weight will improve Outcome: Progressing   Problem: Skin Integrity: Goal: Risk for impaired skin integrity will decrease Outcome: Progressing   Problem: Tissue Perfusion: Goal: Adequacy of tissue perfusion will improve Outcome: Progressing   
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
## 2022-03-26 NOTE — Progress Notes (Signed)
PROGRESS NOTE    [name]  GYJ:[id number] DOB: 28-Oct-1932 DOA: 03/24/2022 PCP: [name], DO    Brief Narrative:   [name] is a 88 y.o. male with past medical history significant for paroxysmal atrial fibrillation, CAD, remote history of prostate cancer, dyslipidemia, essential hypertension, OSA intolerant of CPAP who presented to [facility] ED on 7/31 with vomiting, abdominal distention and recurrent hematuria.  Patient was apparently in good health until February 2023 in which she sustained a pelvic fracture and has been chair bound since.  In May 2023 he had a PEG placed secondary to poor oral intake and on 01/24/2022 he developed cellulitis and infection around his PEG tube site.  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  Patient has been seen by urology initially discontinued Foley catheter but need to be replaced due to urinary retention.  Additionally patient with a G-tube in place on Jevity 90 cc/h and 148 cc of free water every 4 hours.  Daughter reported episode of projectile nausea and vomiting and his tube feeds were held for 15 hours.  The following day his tube feeds were resumed around lunch and within 1 hour he became to have projectile emesis.  In the ED, patient was noted to have a hemoglobin of 6, transfuse 1 unit PRBC and with noted hematuria.  Hospital service consulted for further evaluation and management.  Assessment & Plan:   Acute hypoxic respiratory failure Aspiration pneumonia On arrival to ED patient noted to be hypoxic with SPO2 87%.  CT chest with findings consistent with aspiration pneumonia. --Continue linezolid and IV cefepime --Continue supplemental oxygen, maintain SPO2 greater than 92%, currently on 4 L nasal cannula with SPO2 100% at rest --Aspiration precautions  Hematuria likely secondary to acute on  chronic cystitis Anemia secondary to hematuria and chronic disease Recently treated outpatient for VRE on linezolid.  On admission, hemoglobin 6, transfuse 1 unit PRBC.  Seen by urology, no need for further intervention or CBI at this time and to treat conservatively with blood transfusion; and recommended Foley catheter exchanges every 3 weeks.  Recent urine culture 7/25 with Klebsiella resistant to ampicillin and tetracycline, sensitive to all others. --Hgb 6.0>7.1>6.1 --Transfuse additional unit PRBC today --Repeat hemoglobin 9 PM and CBC in a.m. --Transfuse for hemoglobin less than 7.0 --Ferrous gluconate 324 mg daily --Continue Foley catheter  Thrombocytopenia, chronic Stable --CBC daily  Sacral/heel decubitus ulcer --Air mattress, frequent offloading, local wound care  CAD Hx CABG 09/2010. LHC 03/2014.  TTE 01/2022 with LVEF 40-45%. --Hold losartan  Paroxysmal atrial fibrillation --Amiodarone 200 mg per tube daily  Type 2 diabetes mellitus On metformin 500 mg daily outpatient.  Hemoglobin A1c 5.2 on 01/06/2022, well controlled. --Hold home metformin --CBG every 8 hours  Depression: Venlafaxine 37.5 mg nightly  Adult failure to thrive Patient with progressive decline since February 2023.  Unfortunately poor overall prognosis.  Received message from PCP, Dr. Lajuana Ripple who recommended and agreed with palliative care consultation. --Palliative care consulted for assistance with goals of care and medical decision making  DVT prophylaxis: SCDs Start: 03/25/22 0518 SCDs Start: 03/25/22 0516    Code Status: Full Code Family Communication: No family present at bedside this morning  Disposition Plan:  Level of care: Med-Surg Status is: Inpatient Remains inpatient appropriate because: IV antibiotics, awaiting palliative care assessment    Consultants:  Urology Palliative care  Procedures:  None  Antimicrobials:  Linezolid Cefepime   Subjective: Patient seen  examined bedside, resting comfortably.  No family present.  Confused.  Unable to assess fully due to current mental status.  No acute concerns overnight per nurse staff.  Objective: Vitals:   03/26/22 0614 03/26/22 0726 03/26/22 0811 03/26/22 1050  BP: (!) 113/59 (!) 113/51 (!) 118/51 (!) 128/59  Pulse: 64 (!) 59 60 66  Resp: '19 19 19 18  '$ Temp: 97.8 F (36.6 C) 97.8 F (36.6 C) 98.1 F (36.7 C) 97.8 F (36.6 C)  TempSrc: Oral Axillary Axillary Axillary  SpO2: 100% 100% 100% 100%  Weight:      Height:        Intake/Output Summary (Last 24 hours) at 03/26/2022 1632 Last data filed at 03/26/2022 1100 Gross per 24 hour  Intake 315 ml  Output 900 ml  Net -585 ml   Filed Weights   03/24/22 2312  Weight: 80 kg    Examination:  Physical Exam: GEN: NAD, alert, lying in bed, confused HEENT: NCAT, PERRL, EOMI, sclera clear, MMM PULM: Breath sounds slightly decreased bilateral bases, no wheezing/crackles, normal respiratory effort, on 4 L nasal cannula with SPO2 100 percent at rest CV: RRR w/o M/G/R GU: Foley catheter noted with blood in collection bag GI: abd soft, NTND, NABS, no R/G/M, G-tube noted MSK: no peripheral edema, moving all extremities independently    Data Reviewed: I have personally reviewed following labs and imaging studies  CBC: Recent Labs  Lab 03/24/22 1905 03/25/22 1057 03/26/22 0340 03/26/22 1509  WBC 8.5 6.2 4.6  --   NEUTROABS 6.6  --  2.6  --   HGB 6.0* 7.1* 6.1* 7.1*  HCT 21.1* 23.9* 21.1* 24.4*  MCV 105.5* 103.0* 103.9*  --   PLT 124* 108* 88*  --    Basic Metabolic Panel: Recent Labs  Lab 03/24/22 1905 03/25/22 1057 03/26/22 0340  NA 140 143 142  K 4.8 4.6 4.3  CL 107 112* 111  CO2 '27 27 27  '$ GLUCOSE 152* 135* 140*  BUN 66* 54* 40*  CREATININE 1.34* 1.24 1.02  CALCIUM 10.4* 9.8 9.1   GFR: Estimated Creatinine Clearance: 54.3 mL/min (by C-G formula based on SCr of 1.02 mg/dL). Liver Function Tests: Recent Labs  Lab  03/24/22 1905 03/25/22 1057  AST 36 27  ALT 26 24  ALKPHOS 85 79  BILITOT 0.5 0.7  PROT 5.3* 5.1*  ALBUMIN 2.4* 2.3*   No results for input(s): "LIPASE", "AMYLASE" in the last 168 hours. No results for input(s): "AMMONIA" in the last 168 hours. Coagulation Profile: Recent Labs  Lab 03/24/22 1905  INR 1.6*   Cardiac Enzymes: No results for input(s): "CKTOTAL", "CKMB", "CKMBINDEX", "TROPONINI" in the last 168 hours. BNP (last 3 results) No results for input(s): "PROBNP" in the last 8760 hours. HbA1C: No results for input(s): "HGBA1C" in the last 72 hours. CBG: Recent Labs  Lab 03/25/22 2255 03/26/22 0121 03/26/22 0643 03/26/22 0822 03/26/22 1218  GLUCAP 70 167* 104* 109* 98   Lipid Profile: No results for input(s): "CHOL", "HDL", "LDLCALC", "TRIG", "CHOLHDL", "LDLDIRECT" in the last 72 hours. Thyroid Function Tests: No results for input(s): "TSH", "T4TOTAL", "FREET4", "T3FREE", "THYROIDAB" in the last 72 hours. Anemia Panel: No results for input(s): "VITAMINB12", "FOLATE", "FERRITIN", "TIBC", "IRON", "RETICCTPCT" in the last 72 hours. Sepsis Labs: Recent Labs  Lab 03/24/22 1905 03/24/22 2129  LATICACIDVEN 3.5* 2.4*    Recent Results (from the past 240 hour(s))  Urine Culture  Status: Abnormal   Collection Time: 03/18/22  1:59 PM   Specimen: Urine   UR  Result Value Ref Range Status   Urine Culture, Routine Final report (A)  Final   Organism ID, Bacteria Klebsiella pneumoniae (A)  Final    Comment: Greater than 100,000 colony forming units per mL   Antimicrobial Susceptibility Comment  Final    Comment:       ** S = Susceptible; I = Intermediate; R = Resistant **                    P = Positive; N = Negative             MICS are expressed in micrograms per mL    Antibiotic                 RSLT#1    RSLT#2    RSLT#3    RSLT#4 Amoxicillin/Clavulanic Acid    S Ampicillin                     R Cefepime                       S Ceftriaxone                     S Cefuroxime                     S Ciprofloxacin                  S Ertapenem                      S Gentamicin                     S Imipenem                       S Levofloxacin                   S Meropenem                      S Nitrofurantoin                 S Piperacillin/Tazobactam        S Tetracycline                   R Tobramycin                     S Trimethoprim/Sulfa             S   Microscopic Examination     Status: Abnormal   Collection Time: 03/18/22  1:59 PM   Urine  Result Value Ref Range Status   WBC, UA >30 (A) 0 - 5 /hpf Final   RBC, Urine >30 (A) 0 - 2 /hpf Final   Epithelial Cells (non renal) None seen 0 - 10 /hpf Final   Renal Epithel, UA None seen None seen /hpf Final   Bacteria, UA Many (A) None seen/Few Final  Urine Culture     Status: Abnormal   Collection Time: 03/24/22  6:19 PM   Specimen: In/Out Cath Urine  Result Value Ref Range Status   Specimen Description IN/OUT CATH URINE  Final   Special Requests   Final  NONE Performed at [facility], [place] [address], [place], [place] [address]    Culture (A)  Final    1,000 COLONIES/mL ENTEROCOCCUS FAECIUM VANCOMYCIN RESISTANT ENTEROCOCCUS    Report Status 03/26/2022 FINAL  Final   Organism ID, Bacteria ENTEROCOCCUS FAECIUM (A)  Final      Susceptibility   Enterococcus faecium - MIC*    AMPICILLIN >=32 RESISTANT Resistant     NITROFURANTOIN 32 SENSITIVE Sensitive     VANCOMYCIN >=32 RESISTANT Resistant     LINEZOLID 2 SENSITIVE Sensitive     * 1,000 COLONIES/mL ENTEROCOCCUS FAECIUM  Blood Culture (routine x 2)     Status: None (Preliminary result)   Collection Time: 03/24/22  7:05 PM   Specimen: BLOOD  Result Value Ref Range Status   Specimen Description BLOOD LEFT ANTECUBITAL  Final   Special Requests   Final    BOTTLES DRAWN AEROBIC AND ANAEROBIC Blood Culture results may not be optimal due to an inadequate volume of blood received in culture bottles   Culture   Final    NO GROWTH  2 DAYS Performed at [facility], [place] [address], [place], [place] [address]    Report Status PENDING  Incomplete  Blood Culture (routine x 2)     Status: None (Preliminary result)   Collection Time: 03/24/22  7:14 PM   Specimen: BLOOD  Result Value Ref Range Status   Specimen Description BLOOD RIGHT ANTECUBITAL  Final   Special Requests   Final    BOTTLES DRAWN AEROBIC AND ANAEROBIC Blood Culture adequate volume   Culture   Final    NO GROWTH 2 DAYS Performed at [facility], [place] [address], [place], [place] [address]    Report Status PENDING  Incomplete         Radiology Studies: CT ABDOMEN PELVIS W CONTRAST  Result Date: 03/24/2022 CLINICAL DATA:  Concern for PE; nausea and vomiting; gross hematuria EXAM: CT ANGIOGRAPHY CHEST; CT ABDOMEN AND PELVIS WITH CONTRAST TECHNIQUE: Multidetector CT imaging of the chest was performed using the standard protocol during bolus administration of intravenous contrast. Multiplanar CT image reconstructions and MIPs were obtained to evaluate the vascular anatomy. Multidetector CT imaging of the abdomen and pelvis was performed using the standard protocol during bolus administration of intravenous contrast. RADIATION DOSE REDUCTION: This exam was performed according to the departmental dose-optimization program which includes automated exposure control, adjustment of the mA and/or kV according to patient size and/or use of iterative reconstruction technique. CONTRAST:  117m OMNIPAQUE IOHEXOL 350 MG/ML SOLN COMPARISON:  CT abdomen and pelvis 01/24/2022 FINDINGS: CTA CHEST FINDINGS Cardiovascular: Satisfactory opacification of the pulmonary arteries to the segmental level. No evidence of pulmonary embolism. Mild cardiomegaly. No pericardial effusion. Left chest wall pacemaker. CABG. Aortic and coronary artery atherosclerosis. Mediastinum/Nodes: No enlarged mediastinal, hilar, or axillary lymph nodes. Thyroid gland, trachea, and esophagus  demonstrate no significant findings. Lungs/Pleura: Diffuse bronchial wall thickening. Scattered mucous plugging greatest in the lower lobes. Clustered nodular opacities greatest in the dependent portions of the upper lobes and in the bilateral lower lobes. Musculoskeletal: Subacute or chronic right rib fractures. Degenerative arthritis both shoulders. Thoracic spondylosis. Review of the MIP images confirms the above findings. CT ABDOMEN and PELVIS FINDINGS Hepatobiliary: No focal liver abnormality is seen. Status post cholecystectomy. No biliary dilatation. Pancreas: Unremarkable. No pancreatic ductal dilatation or surrounding inflammatory changes. Spleen: Spleen at the upper limits of normal in size. Unchanged cyst or hemangioma in the superior portion of  the spleen. Adrenals/Urinary Tract: Adrenals are unremarkable. No urinary calculi or hydronephrosis. Multiple large bilateral renal cysts measuring up to 7.1 cm in the right upper pole. A few cysts are too small to adequately characterize. Cortical scarring left kidney. Foley catheter in the decompressed bladder. Gas within the bladder lumen is presumed related to Foley catheter. Irregular bladder wall thickening. Stomach/Bowel: Gastrostomy tube. Decreased stranding and subcutaneous gas at the site of the gastrostomy since 01/24/2022. Normal caliber large and small bowel. Normal appendix. No bowel wall thickening or adjacent inflammatory change. Vascular/Lymphatic: Aortic atherosclerotic calcification. Redemonstrated low-density in the infrarenal abdominal aorta residual change from aortobifemoral bypass graft. The graft is patent. Unchanged 2.1 cm left common iliac artery aneurysm. Reproductive: Unremarkable. Other: No free intraperitoneal fluid or air. Bilateral fat containing inguinal hernias. Musculoskeletal: Healing fractures in the right sacral ala and posterior right iliac bone. Additional healing fractures of the right superior and inferior pubic rami. No  acute osseous abnormality. Review of the MIP images confirms the above findings. IMPRESSION: 1. Negative for acute pulmonary embolism. 2. Bronchial wall thickening, mucous plugging and scattered nodular opacities greatest in the dependent lungs. Findings are compatible with bronchitis/bronchiolitis with a distribution suspicious for aspiration. 3. Irregular bladder wall thickening. This may be due to bladder decompression however cystitis is not excluded. Recommend correlation with urinalysis. 4. Aortic and coronary artery atherosclerotic calcification. 5. 2.1 cm left common iliac artery aneurysm. 6. Healing fractures of the right superior and inferior pubic rami right sacral ala posterior right iliac bone. Electronically Signed   By: Placido Sou M.D.   On: 03/24/2022 23:07   CT Angio Chest PE W and/or Wo Contrast  Result Date: 03/24/2022 CLINICAL DATA:  Concern for PE; nausea and vomiting; gross hematuria EXAM: CT ANGIOGRAPHY CHEST; CT ABDOMEN AND PELVIS WITH CONTRAST TECHNIQUE: Multidetector CT imaging of the chest was performed using the standard protocol during bolus administration of intravenous contrast. Multiplanar CT image reconstructions and MIPs were obtained to evaluate the vascular anatomy. Multidetector CT imaging of the abdomen and pelvis was performed using the standard protocol during bolus administration of intravenous contrast. RADIATION DOSE REDUCTION: This exam was performed according to the departmental dose-optimization program which includes automated exposure control, adjustment of the mA and/or kV according to patient size and/or use of iterative reconstruction technique. CONTRAST:  134m OMNIPAQUE IOHEXOL 350 MG/ML SOLN COMPARISON:  CT abdomen and pelvis 01/24/2022 FINDINGS: CTA CHEST FINDINGS Cardiovascular: Satisfactory opacification of the pulmonary arteries to the segmental level. No evidence of pulmonary embolism. Mild cardiomegaly. No pericardial effusion. Left chest wall  pacemaker. CABG. Aortic and coronary artery atherosclerosis. Mediastinum/Nodes: No enlarged mediastinal, hilar, or axillary lymph nodes. Thyroid gland, trachea, and esophagus demonstrate no significant findings. Lungs/Pleura: Diffuse bronchial wall thickening. Scattered mucous plugging greatest in the lower lobes. Clustered nodular opacities greatest in the dependent portions of the upper lobes and in the bilateral lower lobes. Musculoskeletal: Subacute or chronic right rib fractures. Degenerative arthritis both shoulders. Thoracic spondylosis. Review of the MIP images confirms the above findings. CT ABDOMEN and PELVIS FINDINGS Hepatobiliary: No focal liver abnormality is seen. Status post cholecystectomy. No biliary dilatation. Pancreas: Unremarkable. No pancreatic ductal dilatation or surrounding inflammatory changes. Spleen: Spleen at the upper limits of normal in size. Unchanged cyst or hemangioma in the superior portion of the spleen. Adrenals/Urinary Tract: Adrenals are unremarkable. No urinary calculi or hydronephrosis. Multiple large bilateral renal cysts measuring up to 7.1 cm in the right upper pole. A few cysts are too small to adequately  characterize. Cortical scarring left kidney. Foley catheter in the decompressed bladder. Gas within the bladder lumen is presumed related to Foley catheter. Irregular bladder wall thickening. Stomach/Bowel: Gastrostomy tube. Decreased stranding and subcutaneous gas at the site of the gastrostomy since 01/24/2022. Normal caliber large and small bowel. Normal appendix. No bowel wall thickening or adjacent inflammatory change. Vascular/Lymphatic: Aortic atherosclerotic calcification. Redemonstrated low-density in the infrarenal abdominal aorta residual change from aortobifemoral bypass graft. The graft is patent. Unchanged 2.1 cm left common iliac artery aneurysm. Reproductive: Unremarkable. Other: No free intraperitoneal fluid or air. Bilateral fat containing inguinal  hernias. Musculoskeletal: Healing fractures in the right sacral ala and posterior right iliac bone. Additional healing fractures of the right superior and inferior pubic rami. No acute osseous abnormality. Review of the MIP images confirms the above findings. IMPRESSION: 1. Negative for acute pulmonary embolism. 2. Bronchial wall thickening, mucous plugging and scattered nodular opacities greatest in the dependent lungs. Findings are compatible with bronchitis/bronchiolitis with a distribution suspicious for aspiration. 3. Irregular bladder wall thickening. This may be due to bladder decompression however cystitis is not excluded. Recommend correlation with urinalysis. 4. Aortic and coronary artery atherosclerotic calcification. 5. 2.1 cm left common iliac artery aneurysm. 6. Healing fractures of the right superior and inferior pubic rami right sacral ala posterior right iliac bone. Electronically Signed   By: Placido Sou M.D.   On: 03/24/2022 23:07   DG Chest Port 1 View  Result Date: 03/24/2022 CLINICAL DATA:  Sepsis. EXAM: PORTABLE CHEST 1 VIEW COMPARISON:  January 24, 2022. FINDINGS: Stable cardiomediastinal silhouette. Status post coronary bypass graft. Left-sided pacemaker is unchanged in position. No acute pulmonary disease is noted. Bony thorax is unremarkable. IMPRESSION: No active disease. Electronically Signed   By: Marijo Conception M.D.   On: 03/24/2022 18:38        Scheduled Meds:  amiodarone  200 mg Per Tube Daily   doxazosin  1 mg Per Tube Daily   famotidine  20 mg Per Tube BID   ferrous gluconate  324 mg Oral QPM   free water  150 mL Per Tube Q4H   insulin aspart  0-5 Units Subcutaneous QHS   insulin aspart  0-9 Units Subcutaneous Q4H   mometasone-formoterol  2 puff Inhalation BID   [START ON 03/27/2022] nutrition supplement (JUVEN)  1 packet Per Tube BID BM   Zinc Oxide   Topical BID   Continuous Infusions:  sodium chloride 75 mL/hr at 03/25/22 2221   ceFEPime (MAXIPIME) IV 2 g  (03/26/22 1053)   feeding supplement (JEVITY 1.5 CAL/FIBER) 1,000 mL (03/26/22 1620)   linezolid (ZYVOX) IV 600 mg (03/26/22 1348)     LOS: 1 day    Time spent: 51 minutes spent on chart review, discussion with nursing staff, consultants, updating family and interview/physical exam; more than 50% of that time was spent in counseling and/or coordination of care.    [place] Andy J [place] DO [facility] Available via Epic secure chat 7am-7pm After these hours, please refer to coverage provider listed on [email or web address] 03/26/2022, 4:32 PM

## 2022-03-27 DIAGNOSIS — J9601 Acute respiratory failure with hypoxia: Secondary | ICD-10-CM | POA: Diagnosis not present

## 2022-03-27 DIAGNOSIS — N179 Acute kidney failure, unspecified: Secondary | ICD-10-CM | POA: Diagnosis not present

## 2022-03-27 DIAGNOSIS — R627 Adult failure to thrive: Secondary | ICD-10-CM | POA: Diagnosis not present

## 2022-03-27 DIAGNOSIS — D649 Anemia, unspecified: Secondary | ICD-10-CM | POA: Diagnosis not present

## 2022-03-27 DIAGNOSIS — N3001 Acute cystitis with hematuria: Secondary | ICD-10-CM | POA: Diagnosis not present

## 2022-03-27 LAB — CBC
HCT: 24.3 % — ABNORMAL LOW (ref 39.0–52.0)
Hemoglobin: 7.3 g/dL — ABNORMAL LOW (ref 13.0–17.0)
MCH: 29.2 pg (ref 26.0–34.0)
MCHC: 30 g/dL (ref 30.0–36.0)
MCV: 97.2 fL (ref 80.0–100.0)
Platelets: 82 10*3/uL — ABNORMAL LOW (ref 150–400)
RBC: 2.5 MIL/uL — ABNORMAL LOW (ref 4.22–5.81)
RDW: 22.1 % — ABNORMAL HIGH (ref 11.5–15.5)
WBC: 4.2 10*3/uL (ref 4.0–10.5)
nRBC: 0 % (ref 0.0–0.2)

## 2022-03-27 LAB — BPAM RBC
Blood Product Expiration Date: 202308192359
Blood Product Expiration Date: 202308242359
ISSUE DATE / TIME: 202307312253
ISSUE DATE / TIME: 202308020734
Unit Type and Rh: 6200
Unit Type and Rh: 6200

## 2022-03-27 LAB — GLUCOSE, CAPILLARY
Glucose-Capillary: 137 mg/dL — ABNORMAL HIGH (ref 70–99)
Glucose-Capillary: 154 mg/dL — ABNORMAL HIGH (ref 70–99)
Glucose-Capillary: 160 mg/dL — ABNORMAL HIGH (ref 70–99)
Glucose-Capillary: 161 mg/dL — ABNORMAL HIGH (ref 70–99)
Glucose-Capillary: 173 mg/dL — ABNORMAL HIGH (ref 70–99)
Glucose-Capillary: 220 mg/dL — ABNORMAL HIGH (ref 70–99)

## 2022-03-27 LAB — BASIC METABOLIC PANEL
Anion gap: 4 — ABNORMAL LOW (ref 5–15)
BUN: 29 mg/dL — ABNORMAL HIGH (ref 8–23)
CO2: 24 mmol/L (ref 22–32)
Calcium: 8.7 mg/dL — ABNORMAL LOW (ref 8.9–10.3)
Chloride: 111 mmol/L (ref 98–111)
Creatinine, Ser: 0.77 mg/dL (ref 0.61–1.24)
GFR, Estimated: >60 mL/min (ref >60)
Glucose, Bld: 180 mg/dL — ABNORMAL HIGH (ref 70–99)
Potassium: 4 mmol/L (ref 3.5–5.1)
Sodium: 139 mmol/L (ref 135–145)

## 2022-03-27 LAB — TYPE AND SCREEN
ABO/RH(D): A POS
Antibody Screen: NEGATIVE
Unit division: 0
Unit division: 0

## 2022-03-27 LAB — PHOSPHORUS: Phosphorus: 2.2 mg/dL — ABNORMAL LOW (ref 2.5–4.6)

## 2022-03-27 LAB — HEMOGLOBIN AND HEMATOCRIT, BLOOD
HCT: 25 % — ABNORMAL LOW (ref 39.0–52.0)
Hemoglobin: 7.5 g/dL — ABNORMAL LOW (ref 13.0–17.0)

## 2022-03-27 LAB — MAGNESIUM: Magnesium: 1.9 mg/dL (ref 1.7–2.4)

## 2022-03-27 MED ORDER — SODIUM PHOSPHATES 45 MMOLE/15ML IV SOLN
15.0000 mmol | Freq: Once | INTRAVENOUS | Status: AC
  Administered 2022-03-27: 15 mmol via INTRAVENOUS
  Filled 2022-03-27: qty 5

## 2022-03-27 NOTE — Progress Notes (Signed)
Patient ID: TRAMPAS STETTNER, male   DOB: 09-06-1932, 88 y.o.   MRN: [id number]    Progress Note from the Palliative Medicine Team at [facility]   Patient Name: [name]        Date: 03/27/2022 DOB: 09-17-1932  Age: 88 y.o. MRN#: [id number] Attending Physician: [place] Eric J, DO Primary Care Physician: [name], DO Admit Date: 03/24/2022   Medical records reviewed   88 y.o. male  admitted on 03/24/2022 with past medical history significant  PAF on Eliquis, HFrEF (EF 35% by TTE 04/25/2020), CAD s/p CABG, Mobitz 2 second-degree AV block s/p PPM, COPD, T2DM, HTN, HLD, thrombocytopenia, recurrent UTIs, OSA not tolerating CPAP,  AMS.   Patient was seen in the ER secondary to vomiting at home.     Admitted for treatment stabilization.   Was seen for the first time by PMT for consultation on the past hospitalization on Jan 07, 2022.     At that time he anticipated discharge home from rehab but was found to be altered with  slurred speech.  After a long complicated hospitalization he was discharged home with a PEG tube and an indwelling Foley.   Patient had another hospitalization in June 2023 secondary to cellulitis and infection around the PEG site.  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  He was treated and follow by urology/Dr. Jeffie Pollock.    Daughter reports that hematuria is chronic over a one year period.   Patient has been cared for in his home since that time.  His daughter [name] is the main caregiver along with wife and other family members.  At some point they did have home health with [facility].    Today patient is lethargic, with garbled speech and unable to follow commands.    Patient does not have medical decision-making capacity.     Patient continues to fail to thrive.  Family face treatment option decisions, advanced directive decisions and anticipatory care needs.  I spoke to [place] by telephone.   Strong encouragement  for family to  come together to make decisions for [name].  Detailed the difference between a full medical intervention path vs a comfort path.  Education offered on hospice benefit; philosophy and eligibility.  Daughter [name] tells me the first time family can come together for a family meeting is Monday at 3:00 pm, until that time family wish to continue with all offered and available medical interventions to prolong life.   Questions and concerns addressed   Discussed with Dr [place] and bedside RN   Wadie Lessen NP  Palliative Medicine Team Team Phone # [phone] Pager [phone]

## 2022-03-27 NOTE — Progress Notes (Signed)
Speech Language Pathology Treatment: Dysphagia  Patient Details Name: [name] MRN: [id number] DOB: [birth year] Today's Date: 03/27/2022 Time: 1445-1500 SLP Time Calculation (min) (ACUTE ONLY): 15 min  Assessment / Plan / Recommendation Clinical Impression  Pt alert and willing to participate though he is completely unintelligible and very sensitive and orally defensive. This is my first visit with the pt; assume this is his baseline function though would need to confirm that. Pt needed to be repositioned, but immediately slid to hunched posture. Pt took a single sip of water with hand over hand assist which immediately resulted in hard prolonged coughing with intermittent crying out. SLP waited for pt to recover to offer a bite of easier to manage puree texture, but pt refused any more. Will continue efforts but recommend relying on G tube at this time given difficulty feeding pt.   HPI HPI: Pt is an 88 y.o. male who presented with projectile emesis. In the ED, pt noted to be sleepy/somnolent with weak wet cough, and poorly interactive. CT chest 7/31: Bronchial wall thickening, mucous plugging and scattered nodular  opacities greatest in the dependent lungs. Findings thought to be  compatible with bronchitis/bronchiolitis with a distribution suspicious for  aspiration. Pt was in good health until February 2023 when he sustained a pelvic fracture and has been chair bound since. PMH: intolerance of CPAP, PAF, CAD, remote history of prostate cancer, dyslipidemia, hypertension.  G-tube placed May, 2023, d/t poor p.o. intake. Pt had cellulitis and infection around the G-tube site June, 2023. BSE 01/28/22: mild oral dysphagia and significant encouragement needed for p.o. intake; dysphagia 2/thin recommended without need for follow up.      SLP Plan  Continue with current plan of care      Recommendations for follow up therapy are one component of a multi-disciplinary discharge planning process, led by  the attending physician.  Recommendations may be updated based on patient status, additional functional criteria and insurance authorization.    Recommendations  Diet recommendations: NPO Medication Administration: Via alternative means                Oral Care Recommendations: Oral care BID Follow Up Recommendations: Other (comment) SLP Visit Diagnosis: Dysphagia, unspecified (R13.10) Plan: Continue with current plan of care           Collan Schoenfeld, Katherene Ponto  03/27/2022, 3:24 PM

## 2022-03-27 NOTE — Progress Notes (Addendum)
PROGRESS NOTE    [name]  CNO:[id number] DOB: Jun 07, 1933 DOA: 03/24/2022 PCP: [name], DO    Brief Narrative:   [name] is a 88 y.o. male with past medical history significant for paroxysmal atrial fibrillation, CAD, remote history of prostate cancer, dyslipidemia, essential hypertension, OSA intolerant of CPAP who presented to [facility] ED on 7/31 with vomiting, abdominal distention and recurrent hematuria.  Patient was apparently in good health until February 2023 in which she sustained a pelvic fracture and has been chair bound since.  In May 2023 he had a PEG placed secondary to poor oral intake and on 01/24/2022 he developed cellulitis and infection around his PEG tube site.  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  Patient has been seen by urology initially discontinued Foley catheter but need to be replaced due to urinary retention.  Additionally patient with a G-tube in place on Jevity 90 cc/h and 148 cc of free water every 4 hours.  Daughter reported episode of projectile nausea and vomiting and his tube feeds were held for 15 hours.  The following day his tube feeds were resumed around lunch and within 1 hour he became to have projectile emesis.  In the ED, patient was noted to have a hemoglobin of 6, transfuse 1 unit PRBC and with noted hematuria.  Hospital service consulted for further evaluation and management.  Assessment & Plan:   Acute hypoxic respiratory failure Aspiration pneumonia On arrival to ED patient noted to be hypoxic with SPO2 87%.  CT chest with findings consistent with aspiration pneumonia. --Continue linezolid  --Continue supplemental oxygen, maintain SPO2 greater than 92%, currently on 2 L nasal cannula with SPO2 100% at rest --Aspiration precautions  Hematuria likely secondary to acute on chronic  cystitis Anemia secondary to hematuria and chronic disease Recently treated outpatient for VRE on linezolid.  On admission, hemoglobin 6, transfuse 1 unit PRBC.  Seen by urology, no need for further intervention or CBI at this time and to treat conservatively with blood transfusion; and recommended Foley catheter exchanges every 3 weeks.  Recent urine culture 7/25 with Klebsiella resistant to ampicillin and tetracycline, sensitive to all others. --Hgb 6.0>7.1>6.1>7.6>7.3 --Transfuse for hemoglobin less than 7.0 --Ferrous gluconate 324 mg daily --Continue Foley catheter  Thrombocytopenia, chronic Stable --CBC daily  Sacral/heel decubitus ulcer --Air mattress, frequent offloading, local wound care  Hypophosphatemia Phosphorus 2.2, will replete. --Repeat electrolytes in a.m.  CAD Hx CABG 09/2010. LHC 03/2014.  TTE 01/2022 with LVEF 40-45%. --Hold losartan  Paroxysmal atrial fibrillation --Amiodarone 200 mg per tube daily  Type 2 diabetes mellitus On metformin 500 mg daily outpatient.  Hemoglobin A1c 5.2 on 01/06/2022, well controlled. --Hold home metformin --CBG every 8 hours  Depression: Venlafaxine 37.5 mg nightly  Adult failure to thrive Patient with progressive decline since February 2023.  Unfortunately poor overall prognosis.  Received message from PCP, Dr. Lajuana Ripple who recommended and agreed with palliative care consultation. --Palliative care consulted for assistance with goals of care and medical decision making  DVT prophylaxis: SCDs Start: 03/25/22 0518 SCDs Start: 03/25/22 0516    Code Status: Full Code Family Communication: Updated daughter [name] present at bedside this morning  Disposition Plan:  Level of care: Med-Surg Status is: Inpatient Remains inpatient appropriate because: IV antibiotics, awaiting palliative care assessment    Consultants:  Urology Palliative care  Procedures:  None  Antimicrobials:  Linezolid 8/1>> Cefepime 8/1 -  8/3 Metronidazole 7/31 - 7/31   Subjective: Patient seen examined bedside, resting comfortably.  Daughter present.  More alert this morning.  Remains confused.  Continues to tolerate tube feeds at advanced rate without nausea/vomiting.  No acute concerns overnight per nursing staff.  Objective: Vitals:   03/26/22 1959 03/26/22 2100 03/27/22 0500 03/27/22 0907  BP: (!) 103/47  (!) 102/50 (!) 132/47  Pulse: 72  70 68  Resp: '20  18 17  '$ Temp: 97.9 F (36.6 C)  98 F (36.7 C) 97.8 F (36.6 C)  TempSrc: Axillary  Axillary Oral  SpO2: 91% 92% 93% 100%  Weight:      Height:        Intake/Output Summary (Last 24 hours) at 03/27/2022 1352 Last data filed at 03/27/2022 1239 Gross per 24 hour  Intake 0 ml  Output 1500 ml  Net -1500 ml   Filed Weights   03/24/22 2312  Weight: 80 kg    Examination:  Physical Exam: GEN: NAD, alert, lying in bed, confused HEENT: NCAT, PERRL, EOMI, sclera clear, MMM PULM: Breath sounds slightly decreased bilateral bases, no wheezing/crackles, normal respiratory effort, on 2 L nasal cannula with SPO2 93% at rest CV: RRR w/o M/G/R GU: Foley catheter noted with blood in collection bag GI: abd soft, NTND, NABS, no R/G/M, G-tube noted MSK: no peripheral edema, moving all extremities independently    Data Reviewed: I have personally reviewed following labs and imaging studies  CBC: Recent Labs  Lab 03/24/22 1905 03/25/22 1057 03/26/22 0340 03/26/22 1509 03/26/22 2216 03/27/22 0126  WBC 8.5 6.2 4.6  --   --  4.2  NEUTROABS 6.6  --  2.6  --   --   --   HGB 6.0* 7.1* 6.1* 7.1* 7.6* 7.3*  HCT 21.1* 23.9* 21.1* 24.4* 25.5* 24.3*  MCV 105.5* 103.0* 103.9*  --   --  97.2  PLT 124* 108* 88*  --   --  82*   Basic Metabolic Panel: Recent Labs  Lab 03/24/22 1905 03/25/22 1057 03/26/22 0340 03/27/22 0126  NA 140 143 142 139  K 4.8 4.6 4.3 4.0  CL 107 112* 111 111  CO2 '27 27 27 24  '$ GLUCOSE 152* 135* 140* 180*  BUN 66* 54* 40* 29*  CREATININE  1.34* 1.24 1.02 0.77  CALCIUM 10.4* 9.8 9.1 8.7*  MG  --   --   --  1.9  PHOS  --   --   --  2.2*   GFR: Estimated Creatinine Clearance: 69.3 mL/min (by C-G formula based on SCr of 0.77 mg/dL). Liver Function Tests: Recent Labs  Lab 03/24/22 1905 03/25/22 1057  AST 36 27  ALT 26 24  ALKPHOS 85 79  BILITOT 0.5 0.7  PROT 5.3* 5.1*  ALBUMIN 2.4* 2.3*   No results for input(s): "LIPASE", "AMYLASE" in the last 168 hours. No results for input(s): "AMMONIA" in the last 168 hours. Coagulation Profile: Recent Labs  Lab 03/24/22 1905  INR 1.6*   Cardiac Enzymes: No results for input(s): "CKTOTAL", "CKMB", "CKMBINDEX", "TROPONINI" in the last 168 hours. BNP (last 3 results) No results for input(s): "PROBNP" in the last 8760 hours. HbA1C: No results for input(s): "HGBA1C" in the last 72 hours. CBG: Recent Labs  Lab 03/26/22 1957 03/27/22 0016 03/27/22 0457 03/27/22 0841 03/27/22 1237  GLUCAP 130* 173* 161* 154* 220*   Lipid Profile: No results for input(s): "CHOL", "HDL", "LDLCALC", "TRIG", "CHOLHDL", "LDLDIRECT" in the last 72 hours.  Thyroid Function Tests: No results for input(s): "TSH", "T4TOTAL", "FREET4", "T3FREE", "THYROIDAB" in the last 72 hours. Anemia Panel: No results for input(s): "VITAMINB12", "FOLATE", "FERRITIN", "TIBC", "IRON", "RETICCTPCT" in the last 72 hours. Sepsis Labs: Recent Labs  Lab 03/24/22 1905 03/24/22 2129  LATICACIDVEN 3.5* 2.4*    Recent Results (from the past 240 hour(s))  Urine Culture     Status: Abnormal   Collection Time: 03/18/22  1:59 PM   Specimen: Urine   UR  Result Value Ref Range Status   Urine Culture, Routine Final report (A)  Final   Organism ID, Bacteria Klebsiella pneumoniae (A)  Final    Comment: Greater than 100,000 colony forming units per mL   Antimicrobial Susceptibility Comment  Final    Comment:       ** S = Susceptible; I = Intermediate; R = Resistant **                    P = Positive; N = Negative              MICS are expressed in micrograms per mL    Antibiotic                 RSLT#1    RSLT#2    RSLT#3    RSLT#4 Amoxicillin/Clavulanic Acid    S Ampicillin                     R Cefepime                       S Ceftriaxone                    S Cefuroxime                     S Ciprofloxacin                  S Ertapenem                      S Gentamicin                     S Imipenem                       S Levofloxacin                   S Meropenem                      S Nitrofurantoin                 S Piperacillin/Tazobactam        S Tetracycline                   R Tobramycin                     S Trimethoprim/Sulfa             S   Microscopic Examination     Status: Abnormal   Collection Time: 03/18/22  1:59 PM   Urine  Result Value Ref Range Status   WBC, UA >30 (A) 0 - 5 /hpf Final   RBC, Urine >30 (A) 0 - 2 /hpf Final   Epithelial Cells (non renal) None seen 0 - 10 /hpf Final   Renal  Epithel, UA None seen None seen /hpf Final   Bacteria, UA Many (A) None seen/Few Final  Urine Culture     Status: Abnormal   Collection Time: 03/24/22  6:19 PM   Specimen: In/Out Cath Urine  Result Value Ref Range Status   Specimen Description IN/OUT CATH URINE  Final   Special Requests   Final    NONE Performed at [facility], [place] [address] [place], [place] [address]    Culture (A)  Final    1,000 COLONIES/mL ENTEROCOCCUS FAECIUM VANCOMYCIN RESISTANT ENTEROCOCCUS    Report Status 03/26/2022 FINAL  Final   Organism ID, Bacteria ENTEROCOCCUS FAECIUM (A)  Final      Susceptibility   Enterococcus faecium - MIC*    AMPICILLIN >=32 RESISTANT Resistant     NITROFURANTOIN 32 SENSITIVE Sensitive     VANCOMYCIN >=32 RESISTANT Resistant     LINEZOLID 2 SENSITIVE Sensitive     * 1,000 COLONIES/mL ENTEROCOCCUS FAECIUM  Blood Culture (routine x 2)     Status: None (Preliminary result)   Collection Time: 03/24/22  7:05 PM   Specimen: BLOOD  Result Value Ref Range Status   Specimen  Description BLOOD LEFT ANTECUBITAL  Final   Special Requests   Final    BOTTLES DRAWN AEROBIC AND ANAEROBIC Blood Culture results may not be optimal due to an inadequate volume of blood received in culture bottles   Culture   Final    NO GROWTH 2 DAYS Performed at [facility], [place] [address], [place], [place] [address]    Report Status PENDING  Incomplete  Blood Culture (routine x 2)     Status: None (Preliminary result)   Collection Time: 03/24/22  7:14 PM   Specimen: BLOOD  Result Value Ref Range Status   Specimen Description BLOOD RIGHT ANTECUBITAL  Final   Special Requests   Final    BOTTLES DRAWN AEROBIC AND ANAEROBIC Blood Culture adequate volume   Culture   Final    NO GROWTH 2 DAYS Performed at [facility], [place] [address], [place], [place] [address]    Report Status PENDING  Incomplete         Radiology Studies: No results found.      Scheduled Meds:  amiodarone  200 mg Per Tube Daily   doxazosin  1 mg Per Tube Daily   famotidine  20 mg Per Tube BID   ferrous gluconate  324 mg Oral QPM   free water  150 mL Per Tube Q4H   insulin aspart  0-5 Units Subcutaneous QHS   insulin aspart  0-9 Units Subcutaneous Q4H   mometasone-formoterol  2 puff Inhalation BID   nutrition supplement (JUVEN)  1 packet Per Tube BID BM   venlafaxine  37.5 mg Per Tube QHS   Zinc Oxide   Topical BID   Continuous Infusions:  sodium chloride Stopped (03/27/22 0939)   feeding supplement (JEVITY 1.5 CAL/FIBER) 1,000 mL (03/26/22 1620)   linezolid (ZYVOX) IV 600 mg (03/27/22 0922)   sodium phosphate 15 mmol in dextrose 5 % 250 mL infusion 15 mmol (03/27/22 0951)     LOS: 2 days    Time spent: 51 minutes spent on chart review, discussion with nursing staff, consultants, updating family and interview/physical exam; more than 50% of that time was spent in counseling and/or coordination of care.    Bess Saltzman J [place] DO [facility] Available via Epic secure chat  7am-7pm After these hours, please refer  to coverage provider listed on [email or web address] 03/27/2022, 1:52 PM

## 2022-03-28 DIAGNOSIS — N3001 Acute cystitis with hematuria: Secondary | ICD-10-CM | POA: Diagnosis not present

## 2022-03-28 DIAGNOSIS — J9601 Acute respiratory failure with hypoxia: Secondary | ICD-10-CM | POA: Diagnosis not present

## 2022-03-28 DIAGNOSIS — D649 Anemia, unspecified: Secondary | ICD-10-CM | POA: Diagnosis not present

## 2022-03-28 DIAGNOSIS — N179 Acute kidney failure, unspecified: Secondary | ICD-10-CM | POA: Diagnosis not present

## 2022-03-28 LAB — MAGNESIUM: Magnesium: 1.9 mg/dL (ref 1.7–2.4)

## 2022-03-28 LAB — GLUCOSE, CAPILLARY
Glucose-Capillary: 112 mg/dL — ABNORMAL HIGH (ref 70–99)
Glucose-Capillary: 141 mg/dL — ABNORMAL HIGH (ref 70–99)
Glucose-Capillary: 174 mg/dL — ABNORMAL HIGH (ref 70–99)
Glucose-Capillary: 184 mg/dL — ABNORMAL HIGH (ref 70–99)
Glucose-Capillary: 202 mg/dL — ABNORMAL HIGH (ref 70–99)
Glucose-Capillary: 215 mg/dL — ABNORMAL HIGH (ref 70–99)
Glucose-Capillary: 250 mg/dL — ABNORMAL HIGH (ref 70–99)

## 2022-03-28 LAB — CBC
HCT: 24.5 % — ABNORMAL LOW (ref 39.0–52.0)
Hemoglobin: 7.3 g/dL — ABNORMAL LOW (ref 13.0–17.0)
MCH: 28.7 pg (ref 26.0–34.0)
MCHC: 29.8 g/dL — ABNORMAL LOW (ref 30.0–36.0)
MCV: 96.5 fL (ref 80.0–100.0)
Platelets: 84 10*3/uL — ABNORMAL LOW (ref 150–400)
RBC: 2.54 MIL/uL — ABNORMAL LOW (ref 4.22–5.81)
RDW: 21.2 % — ABNORMAL HIGH (ref 11.5–15.5)
WBC: 4.1 10*3/uL (ref 4.0–10.5)
nRBC: 0 % (ref 0.0–0.2)

## 2022-03-28 LAB — PHOSPHORUS: Phosphorus: 2.2 mg/dL — ABNORMAL LOW (ref 2.5–4.6)

## 2022-03-28 LAB — BASIC METABOLIC PANEL
Anion gap: 6 (ref 5–15)
BUN: 32 mg/dL — ABNORMAL HIGH (ref 8–23)
CO2: 24 mmol/L (ref 22–32)
Calcium: 8.8 mg/dL — ABNORMAL LOW (ref 8.9–10.3)
Chloride: 109 mmol/L (ref 98–111)
Creatinine, Ser: 0.68 mg/dL (ref 0.61–1.24)
GFR, Estimated: >60 mL/min (ref >60)
Glucose, Bld: 245 mg/dL — ABNORMAL HIGH (ref 70–99)
Potassium: 3.9 mmol/L (ref 3.5–5.1)
Sodium: 139 mmol/L (ref 135–145)

## 2022-03-28 MED ORDER — OXYCODONE HCL 5 MG/5ML PO SOLN: 5.0000 mg | Freq: Four times a day (QID) | ORAL | Status: DC | PRN

## 2022-03-28 MED ORDER — OXYCODONE HCL 5 MG/5ML PO SOLN
5.0000 mg | Freq: Once | ORAL | Status: AC
  Administered 2022-03-28: 5 mg
  Filled 2022-03-28: qty 5

## 2022-03-28 MED ORDER — SODIUM PHOSPHATES 45 MMOLE/15ML IV SOLN
30.0000 mmol | Freq: Once | INTRAVENOUS | Status: AC
  Administered 2022-03-28: 30 mmol via INTRAVENOUS
  Filled 2022-03-28: qty 10

## 2022-03-28 NOTE — Progress Notes (Signed)
SLP Cancellation Note  Patient Details Name: [name] MRN: [id number] DOB: May 15, 1933   Cancelled treatment:       Reason Eval/Treat Not Completed: Patient's level of consciousness (Pt received meds this morning and has been lethargic since. SLP will follow up on a subsequent date.)  Saylor Murry I. Hardin Negus, [place], [place] Office number [phone]  [name] 03/28/2022, 2:55 PM

## 2022-03-28 NOTE — Progress Notes (Signed)
Remote pacemaker transmission.   

## 2022-03-28 NOTE — Progress Notes (Signed)
Pt has blood clots passing through foley this morning.

## 2022-03-28 NOTE — Progress Notes (Signed)
       REMOTE CROSS COVER NOTE  NAME: RANDON SOMERA MRN: [id number] DOB : 11-10-1932    Date of Service   03/28/2022  HPI/Events of Note   Medication requested received from nursing for pain refractory to Tylenol. Nursing reports Mr Kidd is moaning in bed and PAIN-AD score is 8.   Interventions   Plan: Oxycodone 5 mg x1     This document was prepared using Dragon voice recognition software and may include unintentional dictation errors.  Neomia Glass DNP, MHA, [name]-BC Nurse Practitioner [facility] Pager [phone]

## 2022-03-28 NOTE — Consult Note (Signed)
   [facility] Inpatient Consult   03/28/2022  EPHRAM KORNEGAY [birth year] [id number]  [facility] Patient: [employer]   Primary Care Provider:  [name], DO, [place], is an embedded provider with a Chronic Care Management team and program, and is listed for the transition of care follow up and appointments.  Patient was screened for extreme high risk score.  Reviewed for readmission prevention for high risk with the Embedded practice service needs for chronic care management/care coordination.  Notes that patient is active with the Embedded [name] and Pharmacist noted on the Care Teams and encounter notes.  Patient is currently on contact precautions.  1:38 PM Reviewed for post hospital disposition needs, family member had just left for lunch as told to staff.  Plan: A referral can be  sent for post hospital needs. Continue to follow.  Please contact for further questions,  Natividad Brood, RN BSN [facility] Liaison  [phone] business mobile phone Toll free office [phone]  Fax number: [phone] [email or web address]

## 2022-03-28 NOTE — Progress Notes (Signed)
PROGRESS NOTE    [name]  WUJ:[id number] DOB: 1932-09-29 DOA: 03/24/2022 PCP: [name], DO    Brief Narrative:   [name] is a 88 y.o. male with past medical history significant for paroxysmal atrial fibrillation, CAD, remote history of prostate cancer, dyslipidemia, essential hypertension, OSA intolerant of CPAP who presented to [facility] ED on 7/31 with vomiting, abdominal distention and recurrent hematuria.  Patient was apparently in good health until February 2023 in which she sustained a pelvic fracture and has been chair bound since.  In May 2023 he had a PEG placed secondary to poor oral intake and on 01/24/2022 he developed cellulitis and infection around his PEG tube site.  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  Patient has been seen by urology initially discontinued Foley catheter but need to be replaced due to urinary retention.  Additionally patient with a G-tube in place on Jevity 90 cc/h and 148 cc of free water every 4 hours.  Daughter reported episode of projectile nausea and vomiting and his tube feeds were held for 15 hours.  The following day his tube feeds were resumed around lunch and within 1 hour he became to have projectile emesis.  In the ED, patient was noted to have a hemoglobin of 6, transfuse 1 unit PRBC and with noted hematuria.  Hospital service consulted for further evaluation and management.  Assessment & Plan:   Acute hypoxic respiratory failure Aspiration pneumonia On arrival to ED patient noted to be hypoxic with SPO2 87%.  CT chest with findings consistent with aspiration pneumonia. --Continue linezolid  --Continue oxygen, maintain SPO2 >92%, currently on 2 L [place]  --Aspiration precautions  Hematuria likely secondary to acute on chronic cystitis Anemia secondary to hematuria and chronic  disease Recently treated outpatient for VRE on linezolid.  On admission, hemoglobin 6, transfuse 1 unit PRBC.  Seen by urology, no need for further intervention or CBI at this time and to treat conservatively with blood transfusion; and recommended Foley catheter exchanges every 3 weeks.  Recent urine culture 7/25 with Klebsiella resistant to ampicillin and tetracycline, sensitive to all others. --Hgb 6.0>7.1>6.1>7.6>7.3>7.5>7.3 --Transfuse for hemoglobin less than 7.0 --Ferrous gluconate 324 mg daily --Continue Foley catheter  Thrombocytopenia, chronic Stable --CBC daily  Sacral/heel decubitus ulcer --Air mattress, frequent offloading, local wound care  Hypophosphatemia Phosphorus 2.2, will replete. --Repeat electrolytes in a.m.  CAD Hx CABG 09/2010. LHC 03/2014.  TTE 01/2022 with LVEF 40-45%. --Hold losartan  Paroxysmal atrial fibrillation --Amiodarone 200 mg per tube daily  Type 2 diabetes mellitus On metformin 500 mg daily outpatient.  Hemoglobin A1c 5.2 on 01/06/2022, well controlled. --Hold home metformin --CBG every 8 hours  Depression: Venlafaxine 37.5 mg nightly  Adult failure to thrive Patient with progressive decline since February 2023.  Unfortunately poor overall prognosis.  Received message from PCP, Dr. Lajuana Ripple who recommended and agreed with palliative care consultation. --Palliative care consulted for assistance with goals of care and medical decision making  DVT prophylaxis: SCDs Start: 03/25/22 0518 SCDs Start: 03/25/22 0516    Code Status: Full Code Family Communication: Updated daughter [name] present at bedside yesterday afternoon, no family present at bedside this morning  Disposition Plan:  Level of care: Med-Surg Status is: Inpatient Remains inpatient appropriate because: IV antibiotics, palliative care planning family meeting Monday    Consultants:  Urology, Dr. Garen Lah Palliative care  Procedures:  None  Antimicrobials:  Linezolid  8/1>> Cefepime 8/1 - 8/3 Metronidazole 7/31 - 7/31   Subjective: Patient seen examined bedside, resting comfortably.  No family present.  Alert but remains pleasantly confused. Continues to tolerate tube feeds at advanced rate without nausea/vomiting.  Nursing reports clogged J-tube but tube feeds are infusing, does not know what protocols have been done to declog tube and also nursing concerned about some mild blood clots in Foley catheter but currently draining.  No other acute concerns overnight per nursing staff.  Objective: Vitals:   03/27/22 2301 03/27/22 2329 03/28/22 0514 03/28/22 0758  BP: (!) 121/53 130/61 (!) 132/57 114/79  Pulse: (!) 112 78 (!) 57 72  Resp:  '18 18 16  '$ Temp:  98.4 F (36.9 C) 97.9 F (36.6 C) 97.9 F (36.6 C)  TempSrc:  Oral Oral   SpO2: (!) 87% 97% 90% 98%  Weight:      Height:        Intake/Output Summary (Last 24 hours) at 03/28/2022 1326 Last data filed at 03/28/2022 1109 Gross per 24 hour  Intake 4012 ml  Output 1100 ml  Net 2912 ml   Filed Weights   03/24/22 2312  Weight: 80 kg    Examination:  Physical Exam: GEN: NAD, alert, lying in bed, confused HEENT: NCAT, PERRL, EOMI, sclera clear, MMM PULM: Breath sounds slightly decreased bilateral bases, no wheezing/crackles, normal respiratory effort, on 2 L nasal cannula with SPO2 93% at rest CV: RRR w/o M/G/R GU: Foley catheter noted with blood in collection bag GI: abd soft, NTND, NABS, no R/G/M, G-tube noted MSK: no peripheral edema, moving all extremities independently    Data Reviewed: I have personally reviewed following labs and imaging studies  CBC: Recent Labs  Lab 03/24/22 1905 03/25/22 1057 03/26/22 0340 03/26/22 1509 03/26/22 2216 03/27/22 0126 03/27/22 1845 03/28/22 0116  WBC 8.5 6.2 4.6  --   --  4.2  --  4.1  NEUTROABS 6.6  --  2.6  --   --   --   --   --   HGB 6.0* 7.1* 6.1* 7.1* 7.6* 7.3* 7.5* 7.3*  HCT 21.1* 23.9* 21.1* 24.4* 25.5* 24.3* 25.0* 24.5*  MCV  105.5* 103.0* 103.9*  --   --  97.2  --  96.5  PLT 124* 108* 88*  --   --  82*  --  84*   Basic Metabolic Panel: Recent Labs  Lab 03/24/22 1905 03/25/22 1057 03/26/22 0340 03/27/22 0126 03/28/22 0116  NA 140 143 142 139 139  K 4.8 4.6 4.3 4.0 3.9  CL 107 112* 111 111 109  CO2 '27 27 27 24 24  '$ GLUCOSE 152* 135* 140* 180* 245*  BUN 66* 54* 40* 29* 32*  CREATININE 1.34* 1.24 1.02 0.77 0.68  CALCIUM 10.4* 9.8 9.1 8.7* 8.8*  MG  --   --   --  1.9 1.9  PHOS  --   --   --  2.2* 2.2*   GFR: Estimated Creatinine Clearance: 69.3 mL/min (by C-G formula based on SCr of 0.68 mg/dL). Liver Function Tests: Recent Labs  Lab 03/24/22 1905 03/25/22 1057  AST 36 27  ALT 26 24  ALKPHOS 85 79  BILITOT 0.5 0.7  PROT 5.3* 5.1*  ALBUMIN 2.4* 2.3*   No results for input(s): "LIPASE", "AMYLASE" in the last 168 hours. No results for input(s): "AMMONIA" in the last 168 hours. Coagulation Profile: Recent Labs  Lab 03/24/22 1905  INR 1.6*   Cardiac Enzymes: No results  for input(s): "CKTOTAL", "CKMB", "CKMBINDEX", "TROPONINI" in the last 168 hours. BNP (last 3 results) No results for input(s): "PROBNP" in the last 8760 hours. HbA1C: No results for input(s): "HGBA1C" in the last 72 hours. CBG: Recent Labs  Lab 03/27/22 2258 03/28/22 0051 03/28/22 0514 03/28/22 0827 03/28/22 1156  GLUCAP 160* 250* 184* 174* 202*   Lipid Profile: No results for input(s): "CHOL", "HDL", "LDLCALC", "TRIG", "CHOLHDL", "LDLDIRECT" in the last 72 hours. Thyroid Function Tests: No results for input(s): "TSH", "T4TOTAL", "FREET4", "T3FREE", "THYROIDAB" in the last 72 hours. Anemia Panel: No results for input(s): "VITAMINB12", "FOLATE", "FERRITIN", "TIBC", "IRON", "RETICCTPCT" in the last 72 hours. Sepsis Labs: Recent Labs  Lab 03/24/22 1905 03/24/22 2129  LATICACIDVEN 3.5* 2.4*    Recent Results (from the past 240 hour(s))  Urine Culture     Status: Abnormal   Collection Time: 03/18/22  1:59 PM    Specimen: Urine   UR  Result Value Ref Range Status   Urine Culture, Routine Final report (A)  Final   Organism ID, Bacteria Klebsiella pneumoniae (A)  Final    Comment: Greater than 100,000 colony forming units per mL   Antimicrobial Susceptibility Comment  Final    Comment:       ** S = Susceptible; I = Intermediate; R = Resistant **                    P = Positive; N = Negative             MICS are expressed in micrograms per mL    Antibiotic                 RSLT#1    RSLT#2    RSLT#3    RSLT#4 Amoxicillin/Clavulanic Acid    S Ampicillin                     R Cefepime                       S Ceftriaxone                    S Cefuroxime                     S Ciprofloxacin                  S Ertapenem                      S Gentamicin                     S Imipenem                       S Levofloxacin                   S Meropenem                      S Nitrofurantoin                 S Piperacillin/Tazobactam        S Tetracycline                   R Tobramycin                     S Trimethoprim/Sulfa  S   Microscopic Examination     Status: Abnormal   Collection Time: 03/18/22  1:59 PM   Urine  Result Value Ref Range Status   WBC, UA >30 (A) 0 - 5 /hpf Final   RBC, Urine >30 (A) 0 - 2 /hpf Final   Epithelial Cells (non renal) None seen 0 - 10 /hpf Final   Renal Epithel, UA None seen None seen /hpf Final   Bacteria, UA Many (A) None seen/Few Final  Urine Culture     Status: Abnormal   Collection Time: 03/24/22  6:19 PM   Specimen: In/Out Cath Urine  Result Value Ref Range Status   Specimen Description IN/OUT CATH URINE  Final   Special Requests   Final    NONE Performed at [facility], [address] [place], [place] [address]    Culture (A)  Final    1,000 COLONIES/mL ENTEROCOCCUS FAECIUM VANCOMYCIN RESISTANT ENTEROCOCCUS    Report Status 03/26/2022 FINAL  Final   Organism ID, Bacteria ENTEROCOCCUS FAECIUM (A)  Final      Susceptibility    Enterococcus faecium - MIC*    AMPICILLIN >=32 RESISTANT Resistant     NITROFURANTOIN 32 SENSITIVE Sensitive     VANCOMYCIN >=32 RESISTANT Resistant     LINEZOLID 2 SENSITIVE Sensitive     * 1,000 COLONIES/mL ENTEROCOCCUS FAECIUM  Blood Culture (routine x 2)     Status: None (Preliminary result)   Collection Time: 03/24/22  7:05 PM   Specimen: BLOOD  Result Value Ref Range Status   Specimen Description BLOOD LEFT ANTECUBITAL  Final   Special Requests   Final    BOTTLES DRAWN AEROBIC AND ANAEROBIC Blood Culture results may not be optimal due to an inadequate volume of blood received in culture bottles   Culture   Final    NO GROWTH 4 DAYS Performed at [facility], [place] [address], [place], [place] [address]    Report Status PENDING  Incomplete  Blood Culture (routine x 2)     Status: None (Preliminary result)   Collection Time: 03/24/22  7:14 PM   Specimen: BLOOD  Result Value Ref Range Status   Specimen Description BLOOD RIGHT ANTECUBITAL  Final   Special Requests   Final    BOTTLES DRAWN AEROBIC AND ANAEROBIC Blood Culture adequate volume   Culture   Final    NO GROWTH 4 DAYS Performed at [facility], [place] [address], [place],  [address]    Report Status PENDING  Incomplete         Radiology Studies: No results found.      Scheduled Meds:  amiodarone  200 mg Per Tube Daily   doxazosin  1 mg Per Tube Daily   famotidine  20 mg Per Tube BID   ferrous gluconate  324 mg Oral QPM   free water  150 mL Per Tube Q4H   insulin aspart  0-5 Units Subcutaneous QHS   insulin aspart  0-9 Units Subcutaneous Q4H   mometasone-formoterol  2 puff Inhalation BID   nutrition supplement (JUVEN)  1 packet Per Tube BID BM   venlafaxine  37.5 mg Per Tube QHS   Zinc Oxide   Topical BID   Continuous Infusions:  feeding supplement (JEVITY 1.5 CAL/FIBER) 1,000 mL (03/28/22 1107)   linezolid (ZYVOX) IV 600 mg (03/28/22 1050)   sodium phosphate 30 mmol in dextrose 5 %  250 mL infusion 30 mmol (03/28/22 0839)  LOS: 3 days    Time spent: 51 minutes spent on chart review, discussion with nursing staff, consultants, updating family and interview/physical exam; more than 50% of that time was spent in counseling and/or coordination of care.    [place] Warriner J [place] DO [facility] Available via Epic secure chat 7am-7pm After these hours, please refer to coverage provider listed on [email or web address] 03/28/2022, 1:26 PM

## 2022-03-29 DIAGNOSIS — J9601 Acute respiratory failure with hypoxia: Secondary | ICD-10-CM | POA: Diagnosis not present

## 2022-03-29 DIAGNOSIS — N3001 Acute cystitis with hematuria: Secondary | ICD-10-CM | POA: Diagnosis not present

## 2022-03-29 DIAGNOSIS — D649 Anemia, unspecified: Secondary | ICD-10-CM | POA: Diagnosis not present

## 2022-03-29 DIAGNOSIS — N179 Acute kidney failure, unspecified: Secondary | ICD-10-CM | POA: Diagnosis not present

## 2022-03-29 LAB — GLUCOSE, CAPILLARY
Glucose-Capillary: 192 mg/dL — ABNORMAL HIGH (ref 70–99)
Glucose-Capillary: 155 mg/dL — ABNORMAL HIGH (ref 70–99)
Glucose-Capillary: 174 mg/dL — ABNORMAL HIGH (ref 70–99)
Glucose-Capillary: 139 mg/dL — ABNORMAL HIGH (ref 70–99)
Glucose-Capillary: 134 mg/dL — ABNORMAL HIGH (ref 70–99)

## 2022-03-29 LAB — CULTURE, BLOOD (ROUTINE X 2)
Culture: NO GROWTH
Culture: NO GROWTH
Special Requests: ADEQUATE

## 2022-03-29 LAB — BASIC METABOLIC PANEL
Anion gap: 6 (ref 5–15)
BUN: 31 mg/dL — ABNORMAL HIGH (ref 8–23)
CO2: 22 mmol/L (ref 22–32)
Calcium: 8.6 mg/dL — ABNORMAL LOW (ref 8.9–10.3)
Chloride: 106 mmol/L (ref 98–111)
Creatinine, Ser: 0.67 mg/dL (ref 0.61–1.24)
GFR, Estimated: >60 mL/min (ref >60)
Glucose, Bld: 181 mg/dL — ABNORMAL HIGH (ref 70–99)
Potassium: 4.8 mmol/L (ref 3.5–5.1)
Sodium: 134 mmol/L — ABNORMAL LOW (ref 135–145)

## 2022-03-29 LAB — CBC
HCT: 27.4 % — ABNORMAL LOW (ref 39.0–52.0)
Hemoglobin: 8.3 g/dL — ABNORMAL LOW (ref 13.0–17.0)
MCH: 29.4 pg (ref 26.0–34.0)
MCHC: 30.3 g/dL (ref 30.0–36.0)
MCV: 97.2 fL (ref 80.0–100.0)
Platelets: 84 10*3/uL — ABNORMAL LOW (ref 150–400)
RBC: 2.82 MIL/uL — ABNORMAL LOW (ref 4.22–5.81)
RDW: 20.6 % — ABNORMAL HIGH (ref 11.5–15.5)
WBC: 5.1 10*3/uL (ref 4.0–10.5)
nRBC: 0 % (ref 0.0–0.2)

## 2022-03-29 LAB — MAGNESIUM: Magnesium: 1.9 mg/dL (ref 1.7–2.4)

## 2022-03-29 LAB — PHOSPHORUS: Phosphorus: 2.8 mg/dL (ref 2.5–4.6)

## 2022-03-29 MED ORDER — FLUCONAZOLE 100 MG PO TABS
200.0000 mg | ORAL_TABLET | Freq: Once | ORAL | Status: AC
  Administered 2022-03-29: 200 mg
  Filled 2022-03-29: qty 2

## 2022-03-29 MED ORDER — CHLORHEXIDINE GLUCONATE CLOTH 2 % EX PADS
6.0000 | MEDICATED_PAD | Freq: Every day | CUTANEOUS | Status: DC
  Administered 2022-03-29 – 2022-04-03 (×5): 6 via TOPICAL

## 2022-03-29 MED ORDER — FLUCONAZOLE 100 MG PO TABS
100.0000 mg | ORAL_TABLET | Freq: Every day | ORAL | Status: DC
  Administered 2022-03-30 – 2022-04-06 (×8): 100 mg
  Filled 2022-03-29 (×9): qty 1

## 2022-03-29 NOTE — Progress Notes (Signed)
PROGRESS NOTE    [name]  EQA:[id number] DOB: 1932/12/22 DOA: 03/24/2022 PCP: [name], DO    Brief Narrative:   [name] is a 88 y.o. male with past medical history significant for paroxysmal atrial fibrillation, CAD, remote history of prostate cancer, dyslipidemia, essential hypertension, OSA intolerant of CPAP who presented to [facility] ED on 7/31 with vomiting, abdominal distention and recurrent hematuria.  Patient was apparently in good health until February 2023 in which she sustained a pelvic fracture and has been chair bound since.  In May 2023 he had a PEG placed secondary to poor oral intake and on 01/24/2022 he developed cellulitis and infection around his PEG tube site.  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  Patient has been seen by urology initially discontinued Foley catheter but need to be replaced due to urinary retention.  Additionally patient with a G-tube in place on Jevity 90 cc/h and 148 cc of free water every 4 hours.  Daughter reported episode of projectile nausea and vomiting and his tube feeds were held for 15 hours.  The following day his tube feeds were resumed around lunch and within 1 hour he became to have projectile emesis.  In the ED, patient was noted to have a hemoglobin of 6, transfuse 1 unit PRBC and with noted hematuria.  Hospital service consulted for further evaluation and management.  Assessment & Plan:   Acute hypoxic respiratory failure Aspiration pneumonia On arrival to ED patient noted to be hypoxic with SPO2 87%.  CT chest with findings consistent with aspiration pneumonia. --Continue linezolid, plan 10 day course --Continue oxygen, maintain SPO2 >92%, currently on 2 L [place]  --Aspiration precautions  Hematuria likely secondary to acute on chronic cystitis Anemia secondary to hematuria and  chronic disease VRE Enterococcus facium UTI Recently treated outpatient for VRE on linezolid.  On admission, hemoglobin 6, transfuse 1 unit PRBC.  Seen by urology, no need for further intervention or CBI at this time and to treat conservatively with blood transfusion; and recommended Foley catheter exchanges every 3 weeks.  Recent urine culture 7/25 with Klebsiella resistant to ampicillin and tetracycline, sensitive to all others. --Hgb 6.0>7.1>6.1>7.6>7.3>7.5>7.3>8.3 --Transfuse for hemoglobin less than 7.0 --Linezolid 600 mg IV every 12 hours, plan 10-day course --Ferrous gluconate 324 mg daily --Continue Foley catheter  Thrombocytopenia, chronic Stable --CBC daily  Sacral/heel decubitus ulcer --Air mattress, frequent offloading, local wound care  Hypophosphatemia Phosphorus 2.2, will replete. --Repeat electrolytes in a.m.  CAD Hx CABG 09/2010. LHC 03/2014.  TTE 01/2022 with LVEF 40-45%. --Hold losartan  Paroxysmal atrial fibrillation --Amiodarone 200 mg per tube daily  Type 2 diabetes mellitus On metformin 500 mg daily outpatient.  Hemoglobin A1c 5.2 on 01/06/2022, well controlled. --Hold home metformin --CBG every 8 hours  Depression: Venlafaxine 37.5 mg nightly  Adult failure to thrive Patient with progressive decline since February 2023.  Unfortunately poor overall prognosis.  Received message from PCP, Dr. Lajuana Ripple who recommended and agreed with palliative care consultation. --Palliative care consulted for assistance with goals of care and medical decision making  DVT prophylaxis: SCDs Start: 03/25/22 0518 SCDs Start: 03/25/22 0516    Code Status: Full Code Family Communication: No family present at bedside, anticipate family meeting planned for Monday 8/7.  Overall remains very poor prognosis.  Disposition Plan:  Level of care: Med-Surg Status is: Inpatient Remains inpatient appropriate because: IV antibiotics,  palliative care planning family meeting Monday     Consultants:  Urology, Dr. Garen Lah Palliative care  Procedures:  None  Antimicrobials:  Linezolid 8/1>> Cefepime 8/1 - 8/3 Metronidazole 7/31 - 7/31   Subjective: Patient seen examined bedside, resting comfortably.  No family present.  Alert but remains pleasantly confused. Continues to tolerate tube feeds at advanced rate without nausea/vomiting.  No other acute concerns overnight per nursing staff.  Overall mains very poor prognosis, family meeting planned with palliative care on Monday, 03/31/2022.  Objective: Vitals:   03/28/22 2016 03/29/22 0426 03/29/22 0738 03/29/22 0744  BP: (!) 125/51 (!) 111/56  (!) 119/56  Pulse: (!) 110 66  62  Resp: '20 18  16  '$ Temp: 97.6 F (36.4 C) 97.6 F (36.4 C)  97.6 F (36.4 C)  TempSrc: Axillary Axillary  Oral  SpO2: 100% 99% 98% (!) 88%  Weight:      Height:        Intake/Output Summary (Last 24 hours) at 03/29/2022 1343 Last data filed at 03/29/2022 1342 Gross per 24 hour  Intake 3123 ml  Output 1900 ml  Net 1223 ml   Filed Weights   03/24/22 2312  Weight: 80 kg    Examination:  Physical Exam: GEN: NAD, alert, lying in bed, confused HEENT: NCAT, PERRL, EOMI, sclera clear, MMM PULM: Breath sounds slightly decreased bilateral bases, no wheezing/crackles, normal respiratory effort, on 2 L nasal cannula with SPO2 98% at rest CV: RRR w/o M/G/R GU: Foley catheter noted with blood in collection bag GI: abd soft, NTND, NABS, no R/G/M, G-tube noted MSK: no peripheral edema, moving all extremities independently    Data Reviewed: I have personally reviewed following labs and imaging studies  CBC: Recent Labs  Lab 03/24/22 1905 03/25/22 1057 03/26/22 0340 03/26/22 1509 03/26/22 2216 03/27/22 0126 03/27/22 1845 03/28/22 0116 03/29/22 0102  WBC 8.5 6.2 4.6  --   --  4.2  --  4.1 5.1  NEUTROABS 6.6  --  2.6  --   --   --   --   --   --   HGB 6.0* 7.1* 6.1*   < > 7.6* 7.3* 7.5* 7.3* 8.3*  HCT 21.1* 23.9* 21.1*   < > 25.5*  24.3* 25.0* 24.5* 27.4*  MCV 105.5* 103.0* 103.9*  --   --  97.2  --  96.5 97.2  PLT 124* 108* 88*  --   --  82*  --  84* 84*   < > = values in this interval not displayed.   Basic Metabolic Panel: Recent Labs  Lab 03/25/22 1057 03/26/22 0340 03/27/22 0126 03/28/22 0116 03/29/22 0102  NA 143 142 139 139 134*  K 4.6 4.3 4.0 3.9 4.8  CL 112* 111 111 109 106  CO2 '27 27 24 24 22  '$ GLUCOSE 135* 140* 180* 245* 181*  BUN 54* 40* 29* 32* 31*  CREATININE 1.24 1.02 0.77 0.68 0.67  CALCIUM 9.8 9.1 8.7* 8.8* 8.6*  MG  --   --  1.9 1.9 1.9  PHOS  --   --  2.2* 2.2* 2.8   GFR: Estimated Creatinine Clearance: 69.3 mL/min (by C-G formula based on SCr of 0.67 mg/dL). Liver Function Tests: Recent Labs  Lab 03/24/22 1905 03/25/22 1057  AST 36 27  ALT 26 24  ALKPHOS 85 79  BILITOT 0.5 0.7  PROT 5.3* 5.1*  ALBUMIN 2.4* 2.3*   No results for input(s): "LIPASE", "AMYLASE" in the last 168 hours. No results for input(s): "  AMMONIA" in the last 168 hours. Coagulation Profile: Recent Labs  Lab 03/24/22 1905  INR 1.6*   Cardiac Enzymes: No results for input(s): "CKTOTAL", "CKMB", "CKMBINDEX", "TROPONINI" in the last 168 hours. BNP (last 3 results) No results for input(s): "PROBNP" in the last 8760 hours. HbA1C: No results for input(s): "HGBA1C" in the last 72 hours. CBG: Recent Labs  Lab 03/28/22 2013 03/28/22 2339 03/29/22 0416 03/29/22 0802 03/29/22 1143  GLUCAP 112* 215* 192* 155* 174*   Lipid Profile: No results for input(s): "CHOL", "HDL", "LDLCALC", "TRIG", "CHOLHDL", "LDLDIRECT" in the last 72 hours. Thyroid Function Tests: No results for input(s): "TSH", "T4TOTAL", "FREET4", "T3FREE", "THYROIDAB" in the last 72 hours. Anemia Panel: No results for input(s): "VITAMINB12", "FOLATE", "FERRITIN", "TIBC", "IRON", "RETICCTPCT" in the last 72 hours. Sepsis Labs: Recent Labs  Lab 03/24/22 1905 03/24/22 2129  LATICACIDVEN 3.5* 2.4*    Recent Results (from the past 240  hour(s))  Urine Culture     Status: Abnormal   Collection Time: 03/24/22  6:19 PM   Specimen: In/Out Cath Urine  Result Value Ref Range Status   Specimen Description IN/OUT CATH URINE  Final   Special Requests   Final    NONE Performed at [facility], [address], [place], [place] [address]    Culture (A)  Final    1,000 COLONIES/mL ENTEROCOCCUS FAECIUM VANCOMYCIN RESISTANT ENTEROCOCCUS    Report Status 03/26/2022 FINAL  Final   Organism ID, Bacteria ENTEROCOCCUS FAECIUM (A)  Final      Susceptibility   Enterococcus faecium - MIC*    AMPICILLIN >=32 RESISTANT Resistant     NITROFURANTOIN 32 SENSITIVE Sensitive     VANCOMYCIN >=32 RESISTANT Resistant     LINEZOLID 2 SENSITIVE Sensitive     * 1,000 COLONIES/mL ENTEROCOCCUS FAECIUM  Blood Culture (routine x 2)     Status: None   Collection Time: 03/24/22  7:05 PM   Specimen: BLOOD  Result Value Ref Range Status   Specimen Description BLOOD LEFT ANTECUBITAL  Final   Special Requests   Final    BOTTLES DRAWN AEROBIC AND ANAEROBIC Blood Culture results may not be optimal due to an inadequate volume of blood received in culture bottles   Culture   Final    NO GROWTH 5 DAYS Performed at [facility], [place] [address] [place], [place] [address]    Report Status 03/29/2022 FINAL  Final  Blood Culture (routine x 2)     Status: None   Collection Time: 03/24/22  7:14 PM   Specimen: BLOOD  Result Value Ref Range Status   Specimen Description BLOOD RIGHT ANTECUBITAL  Final   Special Requests   Final    BOTTLES DRAWN AEROBIC AND ANAEROBIC Blood Culture adequate volume   Culture   Final    NO GROWTH 5 DAYS Performed at [facility], [place] [address] [place], [place] [address]    Report Status 03/29/2022 FINAL  Final         Radiology Studies: No results found.      Scheduled Meds:  amiodarone  200 mg Per Tube Daily   Chlorhexidine Gluconate Cloth  6 each Topical Daily   doxazosin  1 mg Per Tube Daily    famotidine  20 mg Per Tube BID   ferrous gluconate  324 mg Oral QPM   free water  150 mL Per Tube Q4H   insulin aspart  0-5 Units Subcutaneous QHS   insulin aspart  0-9  Units Subcutaneous Q4H   mometasone-formoterol  2 puff Inhalation BID   nutrition supplement (JUVEN)  1 packet Per Tube BID BM   venlafaxine  37.5 mg Per Tube QHS   Zinc Oxide   Topical BID   Continuous Infusions:  feeding supplement (JEVITY 1.5 CAL/FIBER) 1,000 mL (03/29/22 0609)   linezolid (ZYVOX) IV 600 mg (03/29/22 1022)     LOS: 4 days    Time spent: 48 minutes spent on chart review, discussion with nursing staff, consultants, updating family and interview/physical exam; more than 50% of that time was spent in counseling and/or coordination of care.    Yug Loria J [place] DO [facility] Available via Epic secure chat 7am-7pm After these hours, please refer to coverage provider listed on [email or web address] 03/29/2022, 1:43 PM

## 2022-03-29 NOTE — Plan of Care (Signed)
  Problem: Safety: Goal: Ability to remain free from injury will improve Outcome: Progressing   

## 2022-03-30 DIAGNOSIS — D649 Anemia, unspecified: Secondary | ICD-10-CM | POA: Diagnosis not present

## 2022-03-30 DIAGNOSIS — J9601 Acute respiratory failure with hypoxia: Secondary | ICD-10-CM | POA: Diagnosis not present

## 2022-03-30 DIAGNOSIS — N179 Acute kidney failure, unspecified: Secondary | ICD-10-CM | POA: Diagnosis not present

## 2022-03-30 DIAGNOSIS — N3001 Acute cystitis with hematuria: Secondary | ICD-10-CM | POA: Diagnosis not present

## 2022-03-30 LAB — BASIC METABOLIC PANEL
Anion gap: 6 (ref 5–15)
BUN: 28 mg/dL — ABNORMAL HIGH (ref 8–23)
CO2: 24 mmol/L (ref 22–32)
Calcium: 8.7 mg/dL — ABNORMAL LOW (ref 8.9–10.3)
Chloride: 102 mmol/L (ref 98–111)
Creatinine, Ser: 0.64 mg/dL (ref 0.61–1.24)
GFR, Estimated: >60 mL/min (ref >60)
Glucose, Bld: 200 mg/dL — ABNORMAL HIGH (ref 70–99)
Potassium: 4.2 mmol/L (ref 3.5–5.1)
Sodium: 132 mmol/L — ABNORMAL LOW (ref 135–145)

## 2022-03-30 LAB — GLUCOSE, CAPILLARY
Glucose-Capillary: 159 mg/dL — ABNORMAL HIGH (ref 70–99)
Glucose-Capillary: 171 mg/dL — ABNORMAL HIGH (ref 70–99)
Glucose-Capillary: 171 mg/dL — ABNORMAL HIGH (ref 70–99)
Glucose-Capillary: 180 mg/dL — ABNORMAL HIGH (ref 70–99)
Glucose-Capillary: 182 mg/dL — ABNORMAL HIGH (ref 70–99)
Glucose-Capillary: 196 mg/dL — ABNORMAL HIGH (ref 70–99)
Glucose-Capillary: 207 mg/dL — ABNORMAL HIGH (ref 70–99)

## 2022-03-30 LAB — CBC
HCT: 24.4 % — ABNORMAL LOW (ref 39.0–52.0)
Hemoglobin: 7.2 g/dL — ABNORMAL LOW (ref 13.0–17.0)
MCH: 29 pg (ref 26.0–34.0)
MCHC: 29.5 g/dL — ABNORMAL LOW (ref 30.0–36.0)
MCV: 98.4 fL (ref 80.0–100.0)
Platelets: 85 10*3/uL — ABNORMAL LOW (ref 150–400)
RBC: 2.48 MIL/uL — ABNORMAL LOW (ref 4.22–5.81)
RDW: 20.2 % — ABNORMAL HIGH (ref 11.5–15.5)
WBC: 3.8 10*3/uL — ABNORMAL LOW (ref 4.0–10.5)
nRBC: 0 % (ref 0.0–0.2)

## 2022-03-30 LAB — PREPARE RBC (CROSSMATCH)

## 2022-03-30 MED ORDER — SODIUM CHLORIDE 0.9% IV SOLUTION: Freq: Once | INTRAVENOUS | Status: AC

## 2022-03-30 NOTE — Progress Notes (Signed)
Informed Dr. [place] of foley catheter leaking. Verbal order to insert new foley catheter.

## 2022-03-30 NOTE — Progress Notes (Signed)
Reports given to [place] in [facility].

## 2022-03-30 NOTE — Progress Notes (Signed)
Rt attempted CPT with flutter valve but pt began yelling and would not cooperate.  RT then tried CPT with manual percussors and pt yelled again.  Rt tried to soothe pt by telling him it was a 'massage' but pt only tolerated it for about 1 minute before he started to yell again and stated it 'didn't feel good.'  RT tried manual CPT as lightly as possible but pt did not tolerate.  Pt may need to try a chest vest but this RT does not believe he will cooperate.  Some other form of CPT may be better for this pt like through the bed but pt's bed does not have that option at this time.  RT will continue to monitor.

## 2022-03-30 NOTE — Progress Notes (Signed)
PROGRESS NOTE    [name]  FGH:[id number] DOB: 04/06/1933 DOA: 03/24/2022 PCP: [name], DO    Brief Narrative:   [name] is a 88 y.o. male with past medical history significant for paroxysmal atrial fibrillation, CAD, remote history of prostate cancer, dyslipidemia, essential hypertension, OSA intolerant of CPAP who presented to [facility] ED on 7/31 with vomiting, abdominal distention and recurrent hematuria.  Patient was apparently in good health until February 2023 in which she sustained a pelvic fracture and has been chair bound since.  In May 2023 he had a PEG placed secondary to poor oral intake and on 01/24/2022 he developed cellulitis and infection around his PEG tube site.  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  Patient has been seen by urology initially discontinued Foley catheter but need to be replaced due to urinary retention.  Additionally patient with a G-tube in place on Jevity 90 cc/h and 148 cc of free water every 4 hours.  Daughter reported episode of projectile nausea and vomiting and his tube feeds were held for 15 hours.  The following day his tube feeds were resumed around lunch and within 1 hour he became to have projectile emesis.  In the ED, patient was noted to have a hemoglobin of 6, transfuse 1 unit PRBC and with noted hematuria.  Hospital service consulted for further evaluation and management.  Assessment & Plan:   Acute hypoxic respiratory failure Aspiration pneumonia On arrival to ED patient noted to be hypoxic with SPO2 87%.  CT chest with findings consistent with aspiration pneumonia. --Continue linezolid, plan 10 day course --Continue oxygen, maintain SPO2 >92%, currently on 2 L [place]  --Aspiration precautions  Hematuria likely secondary to acute on chronic cystitis Anemia secondary to hematuria and  chronic disease VRE Enterococcus facium UTI Recently treated outpatient for VRE on linezolid.  On admission, hemoglobin 6, transfuse 1 unit PRBC.  Seen by urology, no need for further intervention or CBI at this time and to treat conservatively with blood transfusion; and recommended Foley catheter exchanges every 3 weeks.  Recent urine culture 7/25 with Klebsiella resistant to ampicillin and tetracycline, sensitive to all others. --Hgb 6.0>7.1>6.1>7.6>7.3>7.5>7.3>8.3>7.2 --Transfuse for hemoglobin less than 7.0 --Linezolid 600 mg IV every 12 hours, plan 10-day course --Ferrous gluconate 324 mg daily --Continue Foley catheter; exchanged today due to leaking around Foley  Thrombocytopenia, chronic Stable --CBC daily  Sacral/heel decubitus ulcer --Air mattress, frequent offloading, local wound care  Hypophosphatemia Phosphorus 2.2, will replete. --Repeat electrolytes in a.m.  CAD Hx CABG 09/2010. LHC 03/2014.  TTE 01/2022 with LVEF 40-45%. --Hold losartan  Paroxysmal atrial fibrillation --Amiodarone 200 mg per tube daily  Type 2 diabetes mellitus On metformin 500 mg daily outpatient.  Hemoglobin A1c 5.2 on 01/06/2022, well controlled. --Hold home metformin --CBG every 8 hours  Depression: Venlafaxine 37.5 mg nightly  Adult failure to thrive Patient with progressive decline since February 2023.  Unfortunately poor overall prognosis.  Received message from PCP, Dr. Lajuana Ripple who recommended and agreed with palliative care consultation. --Palliative care consulted for assistance with goals of care and medical decision making  DVT prophylaxis: SCDs Start: 03/25/22 0518 SCDs Start: 03/25/22 0516    Code Status: Full Code Family Communication: No family present at bedside, anticipate family meeting planned for Monday 8/7.  Overall remains very poor prognosis.  Disposition Plan:  Level of care: Med-Surg Status is:  Inpatient Remains inpatient appropriate because: IV antibiotics,  palliative care planning family meeting Monday    Consultants:  Urology, Dr. Garen Lah Palliative care  Procedures:  None  Antimicrobials:  Linezolid 8/1>> Cefepime 8/1 - 8/3 Metronidazole 7/31 - 7/31   Subjective: Patient seen examined bedside, resting comfortably.  No family present.  Alert but remains pleasantly confused. Continues to tolerate tube feeds at advanced rate without nausea/vomiting.  RN reports Foley catheter leaking overnight, will replace today.  No other acute concerns overnight per nursing staff.  Overall mains very poor prognosis, family meeting planned with palliative care on Monday, 03/31/2022.  Objective: Vitals:   03/30/22 0414 03/30/22 0802 03/30/22 0812 03/30/22 1214  BP: (!) 110/57 130/67  (!) 111/51  Pulse: 63 63  (!) 58  Resp: '19 18  16  '$ Temp: 97.7 F (36.5 C) 97.7 F (36.5 C)  97.8 F (36.6 C)  TempSrc: Oral Oral  Oral  SpO2: 100% 100% 100% 94%  Weight:      Height:        Intake/Output Summary (Last 24 hours) at 03/30/2022 1314 Last data filed at 03/30/2022 1100 Gross per 24 hour  Intake 0 ml  Output 2075 ml  Net -2075 ml   Filed Weights   03/24/22 2312  Weight: 80 kg    Examination:  Physical Exam: GEN: NAD, alert, lying in bed, confused HEENT: NCAT, PERRL, EOMI, sclera clear, MMM PULM: Breath sounds slightly decreased bilateral bases, no wheezing/crackles, normal respiratory effort, on 2 L nasal cannula with SPO2 98% at rest CV: RRR w/o M/G/R GU: Foley catheter noted with leaking around catheter noted, less blood in collection bag today GI: abd soft, NTND, NABS, no R/G/M, G-tube noted MSK: no peripheral edema, moving all extremities independently    Data Reviewed: I have personally reviewed following labs and imaging studies  CBC: Recent Labs  Lab 03/24/22 1905 03/25/22 1057 03/26/22 0340 03/26/22 1509 03/27/22 0126 03/27/22 1845 03/28/22 0116 03/29/22 0102 03/30/22 0113  WBC 8.5   < > 4.6  --  4.2  --  4.1 5.1 3.8*   NEUTROABS 6.6  --  2.6  --   --   --   --   --   --   HGB 6.0*   < > 6.1*   < > 7.3* 7.5* 7.3* 8.3* 7.2*  HCT 21.1*   < > 21.1*   < > 24.3* 25.0* 24.5* 27.4* 24.4*  MCV 105.5*   < > 103.9*  --  97.2  --  96.5 97.2 98.4  PLT 124*   < > 88*  --  82*  --  84* 84* 85*   < > = values in this interval not displayed.   Basic Metabolic Panel: Recent Labs  Lab 03/26/22 0340 03/27/22 0126 03/28/22 0116 03/29/22 0102 03/30/22 0113  NA 142 139 139 134* 132*  K 4.3 4.0 3.9 4.8 4.2  CL 111 111 109 106 102  CO2 '27 24 24 22 24  '$ GLUCOSE 140* 180* 245* 181* 200*  BUN 40* 29* 32* 31* 28*  CREATININE 1.02 0.77 0.68 0.67 0.64  CALCIUM 9.1 8.7* 8.8* 8.6* 8.7*  MG  --  1.9 1.9 1.9  --   PHOS  --  2.2* 2.2* 2.8  --    GFR: Estimated Creatinine Clearance: 69.3 mL/min (by C-G formula based on SCr of 0.64 mg/dL). Liver Function Tests: Recent Labs  Lab 03/24/22 1905 03/25/22 1057  AST 36 27  ALT 26 24  ALKPHOS 85  79  BILITOT 0.5 0.7  PROT 5.3* 5.1*  ALBUMIN 2.4* 2.3*   No results for input(s): "LIPASE", "AMYLASE" in the last 168 hours. No results for input(s): "AMMONIA" in the last 168 hours. Coagulation Profile: Recent Labs  Lab 03/24/22 1905  INR 1.6*   Cardiac Enzymes: No results for input(s): "CKTOTAL", "CKMB", "CKMBINDEX", "TROPONINI" in the last 168 hours. BNP (last 3 results) No results for input(s): "PROBNP" in the last 8760 hours. HbA1C: No results for input(s): "HGBA1C" in the last 72 hours. CBG: Recent Labs  Lab 03/29/22 2033 03/30/22 0004 03/30/22 0411 03/30/22 0909 03/30/22 1208  GLUCAP 134* 180* 182* 159* 207*   Lipid Profile: No results for input(s): "CHOL", "HDL", "LDLCALC", "TRIG", "CHOLHDL", "LDLDIRECT" in the last 72 hours. Thyroid Function Tests: No results for input(s): "TSH", "T4TOTAL", "FREET4", "T3FREE", "THYROIDAB" in the last 72 hours. Anemia Panel: No results for input(s): "VITAMINB12", "FOLATE", "FERRITIN", "TIBC", "IRON", "RETICCTPCT" in the  last 72 hours. Sepsis Labs: Recent Labs  Lab 03/24/22 1905 03/24/22 2129  LATICACIDVEN 3.5* 2.4*    Recent Results (from the past 240 hour(s))  Urine Culture     Status: Abnormal   Collection Time: 03/24/22  6:19 PM   Specimen: In/Out Cath Urine  Result Value Ref Range Status   Specimen Description IN/OUT CATH URINE  Final   Special Requests   Final    NONE Performed at [facility], [address], [place], [place] [address]    Culture (A)  Final    1,000 COLONIES/mL ENTEROCOCCUS FAECIUM VANCOMYCIN RESISTANT ENTEROCOCCUS    Report Status 03/26/2022 FINAL  Final   Organism ID, Bacteria ENTEROCOCCUS FAECIUM (A)  Final      Susceptibility   Enterococcus faecium - MIC*    AMPICILLIN >=32 RESISTANT Resistant     NITROFURANTOIN 32 SENSITIVE Sensitive     VANCOMYCIN >=32 RESISTANT Resistant     LINEZOLID 2 SENSITIVE Sensitive     * 1,000 COLONIES/mL ENTEROCOCCUS FAECIUM  Blood Culture (routine x 2)     Status: None   Collection Time: 03/24/22  7:05 PM   Specimen: BLOOD  Result Value Ref Range Status   Specimen Description BLOOD LEFT ANTECUBITAL  Final   Special Requests   Final    BOTTLES DRAWN AEROBIC AND ANAEROBIC Blood Culture results may not be optimal due to an inadequate volume of blood received in culture bottles   Culture   Final    NO GROWTH 5 DAYS Performed at [facility], [place] [address], [place], [place] [address]    Report Status 03/29/2022 FINAL  Final  Blood Culture (routine x 2)     Status: None   Collection Time: 03/24/22  7:14 PM   Specimen: BLOOD  Result Value Ref Range Status   Specimen Description BLOOD RIGHT ANTECUBITAL  Final   Special Requests   Final    BOTTLES DRAWN AEROBIC AND ANAEROBIC Blood Culture adequate volume   Culture   Final    NO GROWTH 5 DAYS Performed at [facility], [place] [address] [place],  [address]    Report Status 03/29/2022 FINAL  Final         Radiology Studies: No results  found.      Scheduled Meds:  amiodarone  200 mg Per Tube Daily   Chlorhexidine Gluconate Cloth  6 each Topical Daily   doxazosin  1 mg Per Tube Daily   famotidine  20 mg Per Tube BID   ferrous gluconate  324 mg Oral QPM   fluconazole  100 mg Per Tube Daily   free water  150 mL Per Tube Q4H   insulin aspart  0-5 Units Subcutaneous QHS   insulin aspart  0-9 Units Subcutaneous Q4H   mometasone-formoterol  2 puff Inhalation BID   nutrition supplement (JUVEN)  1 packet Per Tube BID BM   venlafaxine  37.5 mg Per Tube QHS   Zinc Oxide   Topical BID   Continuous Infusions:  feeding supplement (JEVITY 1.5 CAL/FIBER) 1,000 mL (03/30/22 0048)   linezolid (ZYVOX) IV 600 mg (03/30/22 0810)     LOS: 5 days    Time spent: 48 minutes spent on chart review, discussion with nursing staff, consultants, updating family and interview/physical exam; more than 50% of that time was spent in counseling and/or coordination of care.    Letrice Pollok J [place] DO [facility] Available via Epic secure chat 7am-7pm After these hours, please refer to coverage provider listed on [email or web address] 03/30/2022, 1:14 PM

## 2022-03-30 NOTE — Progress Notes (Signed)
Notified Dr. Jeffie Pollock with urology in regards to hematuria and foley exchange due to leaking. Telephone order to change current foley to a 20 french foley catheter with as needed hand irrigation of normal saline.

## 2022-03-30 NOTE — Progress Notes (Signed)
Informed Dr. Jeffie Pollock, secretary unable order 20 french foley. Per provider ok to leave newly placed foley and irrigate as needed.

## 2022-03-30 NOTE — Plan of Care (Signed)
Foley catheter was leaking, MD on call informed.  Problem: Skin Integrity: Goal: Risk for impaired skin integrity will decrease Outcome: Progressing

## 2022-03-30 NOTE — Progress Notes (Signed)
Subjective: I was asked to see [name] for gross hematuria by his daughter.   He has had an ongoing history of urinary retention with recurrent VRE and gross hematuria.   The foley was placed atraumatically in my office on 03/08/22.  His urine was clear then but he had recurrent hematuria after about 3 days.  He remains off of Eliquis.  He saw his PCP and got Bactrim from his PCP but the VRE is not sensitive to that.  He has a 9f foley and I will have it replaced with a 20 fr to aid clot passage.  He is on the LHazelcurrently.  His Hgb is down to 7.2.    He has a history prostate cancer treated with radiation therapy, chronic cystitis and stones and had ureteroscopy and bladder biospy and fulguration in 11/22.  I last saw him in the office on 03/08/22 for hematuria.   He had a CT AP on 6/2 and there was a small amount of calcific debris in the bladder below the foley balloon but no ureteral or renal stones.    ROS:  Review of Systems  Unable to perform ROS: Mental acuity    Anti-infectives: Anti-infectives (From admission, onward)    Start     Dose/Rate Route Frequency Ordered Stop   03/30/22 1000  fluconazole (DIFLUCAN) tablet 100 mg       See Hyperspace for full Linked Orders Report.   100 mg Per Tube Daily 03/29/22 1517 04/12/22 0959   03/29/22 1615  fluconazole (DIFLUCAN) tablet 200 mg       See Hyperspace for full Linked Orders Report.   200 mg Per Tube  Once 03/29/22 1517 03/29/22 1705   03/25/22 1000  ceFEPIme (MAXIPIME) 2 g in sodium chloride 0.9 % 100 mL IVPB  Status:  Discontinued        2 g 200 mL/hr over 30 Minutes Intravenous Every 12 hours 03/24/22 2226 03/27/22 0908   03/25/22 1000  linezolid (ZYVOX) IVPB 600 mg        600 mg 300 mL/hr over 60 Minutes Intravenous Every 12 hours 03/25/22 0559 04/04/22 0959   03/24/22 2045  ceFEPIme (MAXIPIME) 2 g in sodium chloride 0.9 % 100 mL IVPB        2 g 200 mL/hr over 30 Minutes Intravenous  Once 03/24/22 2036 03/24/22  2200   03/24/22 2045  metroNIDAZOLE (FLAGYL) IVPB 500 mg        500 mg 100 mL/hr over 60 Minutes Intravenous  Once 03/24/22 2036 03/24/22 2329       Current Facility-Administered Medications  Medication Dose Route Frequency Provider Last Rate Last Admin   acetaminophen (TYLENOL) tablet 650 mg  650 mg Oral Q6H PRN Crosley, Debby, MD   650 mg at 03/30/22 0006   Or   acetaminophen (TYLENOL) suppository 650 mg  650 mg Rectal Q6H PRN Crosley, Debby, MD       amiodarone (PACERONE) tablet 200 mg  200 mg Per Tube Daily Crosley, Debby, MD   200 mg at 03/30/22 0809   Chlorhexidine Gluconate Cloth 2 % PADS 6 each  6 each Topical Daily [place] Eric J, DO   6 each at 03/30/22 0809   doxazosin (CARDURA) tablet 1 mg  1 mg Per Tube Daily Crosley, Debby, MD   1 mg at 03/30/22 0809   famotidine (PEPCID) tablet 20 mg  20 mg Per Tube BID CQuintella Baton MD   20 mg at 03/30/22 0808   feeding  supplement (JEVITY 1.5 CAL/FIBER) liquid 1,000 mL  1,000 mL Per Tube Continuous [place] Eric J, DO 60 mL/hr at 03/30/22 0048 1,000 mL at 03/30/22 0048   ferrous gluconate (FERGON) tablet 324 mg  324 mg Oral QPM Crosley, Debby, MD   324 mg at 03/29/22 1705   fluconazole (DIFLUCAN) tablet 100 mg  100 mg Per Tube Daily [place] Donnamarie Poag, DO   100 mg at 03/30/22 9798   free water 150 mL  150 mL Per Tube Q4H [place] Donnamarie Poag, DO   150 mL at 03/30/22 1236   insulin aspart (novoLOG) injection 0-5 Units  0-5 Units Subcutaneous QHS Crosley, Debby, MD       insulin aspart (novoLOG) injection 0-9 Units  0-9 Units Subcutaneous Q4H Quintella Baton, MD   3 Units at 03/30/22 1236   linezolid (ZYVOX) IVPB 600 mg  600 mg Intravenous Q12H [place] Donnamarie Poag, DO 300 mL/hr at 03/30/22 0810 600 mg at 03/30/22 0810   mometasone-formoterol (DULERA) 200-5 MCG/ACT inhaler 2 puff  2 puff Inhalation BID Quintella Baton, MD   2 puff at 03/30/22 0808   nutrition supplement (JUVEN) (JUVEN) powder packet 1 packet  1 packet Per Tube BID BM [place] Donnamarie Poag, DO    1 packet at 03/30/22 0809   ondansetron (ZOFRAN) tablet 4 mg  4 mg Oral Q6H PRN Quintella Baton, MD       Or   ondansetron (ZOFRAN) injection 4 mg  4 mg Intravenous Q6H PRN Crosley, Debby, MD       oxyCODONE (ROXICODONE) 5 MG/5ML solution 5 mg  5 mg Per Tube Q6H PRN [place] Eric J, DO       senna-docusate (Senokot-S) tablet 1 tablet  1 tablet Oral QHS PRN Quintella Baton, MD       venlafaxine [facility]) tablet 37.5 mg  37.5 mg Per Tube QHS [place] Donnamarie Poag, DO   37.5 mg at 03/29/22 2153   Zinc Oxide (TRIPLE PASTE) 12.8 % ointment   Topical BID Quintella Baton, MD   Given at 03/30/22 0816     Objective: Vital signs in last 24 hours: Temp:  [97.6 F (36.4 C)-97.8 F (36.6 C)] 97.8 F (36.6 C) (08/06 1214) Pulse Rate:  [58-74] 58 (08/06 1214) Resp:  [14-19] 16 (08/06 1214) BP: (110-144)/(51-67) 111/51 (08/06 1214) SpO2:  [92 %-100 %] 94 % (08/06 1214)  Intake/Output from previous day: 08/05 0701 - 08/06 0700 In: 0  Out: 1700 [Urine:1700] Intake/Output this shift: Total I/O In: -  Out: 375 [Urine:375]   Physical Exam Vitals reviewed.  Constitutional:      Appearance: Normal appearance. He is ill-appearing.  Cardiovascular:     Rate and Rhythm: Normal rate and regular rhythm.  Pulmonary:     Effort: Pulmonary effort is normal. No respiratory distress.  Genitourinary:    Comments: Urine in foley tube is bloody.     Lab Results:  Recent Labs    03/29/22 0102 03/30/22 0113  WBC 5.1 3.8*  HGB 8.3* 7.2*  HCT 27.4* 24.4*  PLT 84* 85*   BMET Recent Labs    03/29/22 0102 03/30/22 0113  NA 134* 132*  K 4.8 4.2  CL 106 102  CO2 22 24  GLUCOSE 181* 200*  BUN 31* 28*  CREATININE 0.67 0.64  CALCIUM 8.6* 8.7*   PT/INR No results for input(s): "LABPROT", "INR" in the last 72 hours. ABG No results for input(s): "PHART", "HCO3" in the last 72 hours.  Invalid input(s): "PCO2", "PO2"  Studies/Results:  No results found. CT AP 03/24/22. IMPRESSION: 1. Negative for  acute pulmonary embolism. 2. Bronchial wall thickening, mucous plugging and scattered nodular opacities greatest in the dependent lungs. Findings are compatible with bronchitis/bronchiolitis with a distribution suspicious for aspiration. 3. Irregular bladder wall thickening. This may be due to bladder decompression however cystitis is not excluded. Recommend correlation with urinalysis. 4. Aortic and coronary artery atherosclerotic calcification. 5. 2.1 cm left common iliac artery aneurysm. 6. Healing fractures of the right superior and inferior pubic rami right sacral ala posterior right iliac bone.     Electronically Signed   By: Placido Sou M.D.   On: 03/24/2022 23:07    Assessment and Plan: Recurrent VRE with gross hematuria and a history of radiation cystitis.   He has had multiple recurrences of the UTI and hematuria and is progressively anemic.  If he is at all a candidate for anesthetic, I need to take him to the OR for cystoscopy, clot evac and fulguration of bleeders.   I have reviewed the risks with him daughter including bleeding, infection, injury to urinary structures, failure of the procedure to stop the bleeding, thrombotic events and anesthetic complications.  She would like for [place] to proceed.    Urinary retention.  His last foley placement was not traumatic.        LOS: 5 days    Irine Seal 03/30/2022 [phone] Patient ID: [name], male   DOB: Apr 29, 1933, 88 y.o.   MRN: [id number]

## 2022-03-30 NOTE — H&P (View-Only) (Signed)
Subjective: I was asked to see [name] for gross hematuria by his daughter.   He has had an ongoing history of urinary retention with recurrent VRE and gross hematuria.   The foley was placed atraumatically in my office on 03/08/22.  His urine was clear then but he had recurrent hematuria after about 3 days.  He remains off of Eliquis.  He saw his PCP and got Bactrim from his PCP but the VRE is not sensitive to that.  He has a 64f foley and I will have it replaced with a 20 fr to aid clot passage.  [place]  His Hgb is down to 7.2.    He has a history prostate cancer treated with radiation therapy, chronic cystitis and stones and had ureteroscopy and bladder biospy and fulguration in 11/22.  I last saw him in the office on 03/08/22 for hematuria.   He had a CT AP on 6/2 and there was a small amount of calcific debris in the bladder below the foley balloon but no ureteral or renal stones.    ROS:  Review of Systems  Unable to perform ROS: Mental acuity    Anti-infectives: Anti-infectives (From admission, onward)    Start     Dose/Rate Route Frequency Ordered Stop   03/30/22 1000  fluconazole (DIFLUCAN) tablet 100 mg       See Hyperspace for full Linked Orders Report.   100 mg Per Tube Daily 03/29/22 1517 04/12/22 0959   03/29/22 1615  fluconazole (DIFLUCAN) tablet 200 mg       See Hyperspace for full Linked Orders Report.   200 mg Per Tube  Once 03/29/22 1517 03/29/22 1705   03/25/22 1000  ceFEPIme (MAXIPIME) 2 g in sodium chloride 0.9 % 100 mL IVPB  Status:  Discontinued        2 g 200 mL/hr over 30 Minutes Intravenous Every 12 hours 03/24/22 2226 03/27/22 0908   03/25/22 1000  linezolid (ZYVOX) IVPB 600 mg        600 mg 300 mL/hr over 60 Minutes Intravenous Every 12 hours 03/25/22 0559 04/04/22 0959   03/24/22 2045  ceFEPIme (MAXIPIME) 2 g in sodium chloride 0.9 % 100 mL IVPB        2 g 200 mL/hr over 30 Minutes Intravenous  Once 03/24/22 2036 03/24/22  2200   03/24/22 2045  metroNIDAZOLE (FLAGYL) IVPB 500 mg        500 mg 100 mL/hr over 60 Minutes Intravenous  Once 03/24/22 2036 03/24/22 2329       Current Facility-Administered Medications  Medication Dose Route Frequency Provider Last Rate Last Admin   acetaminophen (TYLENOL) tablet 650 mg  650 mg Oral Q6H PRN Crosley, Debby, MD   650 mg at 03/30/22 0006   Or   acetaminophen (TYLENOL) suppository 650 mg  650 mg Rectal Q6H PRN Crosley, Debby, MD       amiodarone (PACERONE) tablet 200 mg  200 mg Per Tube Daily Crosley, Debby, MD   200 mg at 03/30/22 0809   Chlorhexidine Gluconate Cloth 2 % PADS 6 each  6 each Topical Daily [place] J, DO   6 each at 03/30/22 0809   doxazosin (CARDURA) tablet 1 mg  1 mg Per Tube Daily Crosley, Debby, MD   1 mg at 03/30/22 0809   famotidine (PEPCID) tablet 20 mg  20 mg Per Tube BID CQuintella Baton MD   20 mg at 03/30/22 0808   feeding  supplement (JEVITY 1.5 CAL/FIBER) liquid 1,000 mL  1,000 mL Per Tube Continuous [place] Eric J, DO 60 mL/hr at 03/30/22 0048 1,000 mL at 03/30/22 0048   ferrous gluconate (FERGON) tablet 324 mg  324 mg Oral QPM Crosley, Debby, MD   324 mg at 03/29/22 1705   fluconazole (DIFLUCAN) tablet 100 mg  100 mg Per Tube Daily [place] Donnamarie Poag, DO   100 mg at 03/30/22 7893   free water 150 mL  150 mL Per Tube Q4H [place] Donnamarie Poag, DO   150 mL at 03/30/22 1236   insulin aspart (novoLOG) injection 0-5 Units  0-5 Units Subcutaneous QHS Crosley, Debby, MD       insulin aspart (novoLOG) injection 0-9 Units  0-9 Units Subcutaneous Q4H Quintella Baton, MD   3 Units at 03/30/22 1236   linezolid (ZYVOX) IVPB 600 mg  600 mg Intravenous Q12H [place] Donnamarie Poag, DO 300 mL/hr at 03/30/22 0810 600 mg at 03/30/22 0810   mometasone-formoterol (DULERA) 200-5 MCG/ACT inhaler 2 puff  2 puff Inhalation BID Quintella Baton, MD   2 puff at 03/30/22 0808   nutrition supplement (JUVEN) (JUVEN) powder packet 1 packet  1 packet Per Tube BID BM [place] Donnamarie Poag, DO    1 packet at 03/30/22 0809   ondansetron (ZOFRAN) tablet 4 mg  4 mg Oral Q6H PRN Quintella Baton, MD       Or   ondansetron (ZOFRAN) injection 4 mg  4 mg Intravenous Q6H PRN Crosley, Debby, MD       oxyCODONE (ROXICODONE) 5 MG/5ML solution 5 mg  5 mg Per Tube Q6H PRN [place] Eric J, DO       senna-docusate (Senokot-S) tablet 1 tablet  1 tablet Oral QHS PRN Quintella Baton, MD       venlafaxine [facility]) tablet 37.5 mg  37.5 mg Per Tube QHS [place] Donnamarie Poag, DO   37.5 mg at 03/29/22 2153   Zinc Oxide (TRIPLE PASTE) 12.8 % ointment   Topical BID Quintella Baton, MD   Given at 03/30/22 0816     Objective: Vital signs in last 24 hours: Temp:  [97.6 F (36.4 C)-97.8 F (36.6 C)] 97.8 F (36.6 C) (08/06 1214) Pulse Rate:  [58-74] 58 (08/06 1214) Resp:  [14-19] 16 (08/06 1214) BP: (110-144)/(51-67) 111/51 (08/06 1214) SpO2:  [92 %-100 %] 94 % (08/06 1214)  Intake/Output from previous day: 08/05 0701 - 08/06 0700 In: 0  Out: 1700 [Urine:1700] Intake/Output this shift: Total I/O In: -  Out: 375 [Urine:375]   Physical Exam Vitals reviewed.  Constitutional:      Appearance: Normal appearance. He is ill-appearing.  Cardiovascular:     Rate and Rhythm: Normal rate and regular rhythm.  Pulmonary:     Effort: Pulmonary effort is normal. No respiratory distress.  Genitourinary:    Comments: Urine in foley tube is bloody.     Lab Results:  Recent Labs    03/29/22 0102 03/30/22 0113  WBC 5.1 3.8*  HGB 8.3* 7.2*  HCT 27.4* 24.4*  PLT 84* 85*   BMET Recent Labs    03/29/22 0102 03/30/22 0113  NA 134* 132*  K 4.8 4.2  CL 106 102  CO2 22 24  GLUCOSE 181* 200*  BUN 31* 28*  CREATININE 0.67 0.64  CALCIUM 8.6* 8.7*   PT/INR No results for input(s): "LABPROT", "INR" in the last 72 hours. ABG No results for input(s): "PHART", "HCO3" in the last 72 hours.  Invalid input(s): "PCO2", "PO2"  Studies/Results:  No results found. CT AP 03/24/22. IMPRESSION: 1. Negative for  acute pulmonary embolism. 2. Bronchial wall thickening, mucous plugging and scattered nodular opacities greatest in the dependent lungs. Findings are compatible with bronchitis/bronchiolitis with a distribution suspicious for aspiration. 3. Irregular bladder wall thickening. This may be due to bladder decompression however cystitis is not excluded. Recommend correlation with urinalysis. 4. Aortic and coronary artery atherosclerotic calcification. 5. 2.1 cm left common iliac artery aneurysm. 6. Healing fractures of the right superior and inferior pubic rami right sacral ala posterior right iliac bone.     Electronically Signed   By: Placido Sou M.D.   On: 03/24/2022 23:07    Assessment and Plan: Recurrent VRE with gross hematuria and a history of radiation cystitis.   He has had multiple recurrences of the UTI and hematuria and is progressively anemic.  If he is at all a candidate for anesthetic, I need to take him to the OR for cystoscopy, clot evac and fulguration of bleeders.   I have reviewed the risks with him daughter including bleeding, infection, injury to urinary structures, failure of the procedure to stop the bleeding, thrombotic events and anesthetic complications.  She would like for [place] to proceed.    Urinary retention.  His last foley placement was not traumatic.        LOS: 5 days    Irine Seal 03/30/2022 [phone] Patient ID: [name], male   DOB: [birth year], 88 y.o.   MRN: [id number]

## 2022-03-31 ENCOUNTER — Encounter (HOSPITAL_COMMUNITY): Payer: Self-pay | Admitting: Internal Medicine

## 2022-03-31 ENCOUNTER — Inpatient Hospital Stay (HOSPITAL_COMMUNITY): Payer: Medicare Other | Admitting: Anesthesiology

## 2022-03-31 ENCOUNTER — Other Ambulatory Visit: Payer: Self-pay | Admitting: Urology

## 2022-03-31 ENCOUNTER — Encounter (HOSPITAL_COMMUNITY)
Admission: EM | Disposition: A | Discharge status: Home Health | Payer: Self-pay | Source: Home / Self Care | Attending: Internal Medicine

## 2022-03-31 DIAGNOSIS — Z1621 Resistance to vancomycin: Secondary | ICD-10-CM | POA: Diagnosis not present

## 2022-03-31 DIAGNOSIS — N3040 Irradiation cystitis without hematuria: Secondary | ICD-10-CM

## 2022-03-31 DIAGNOSIS — J9601 Acute respiratory failure with hypoxia: Secondary | ICD-10-CM | POA: Diagnosis not present

## 2022-03-31 DIAGNOSIS — R319 Hematuria, unspecified: Secondary | ICD-10-CM | POA: Diagnosis not present

## 2022-03-31 DIAGNOSIS — D649 Anemia, unspecified: Secondary | ICD-10-CM | POA: Diagnosis not present

## 2022-03-31 DIAGNOSIS — Z9989 Dependence on other enabling machines and devices: Secondary | ICD-10-CM | POA: Diagnosis not present

## 2022-03-31 DIAGNOSIS — N179 Acute kidney failure, unspecified: Secondary | ICD-10-CM | POA: Diagnosis not present

## 2022-03-31 DIAGNOSIS — N3001 Acute cystitis with hematuria: Secondary | ICD-10-CM | POA: Diagnosis not present

## 2022-03-31 DIAGNOSIS — G4733 Obstructive sleep apnea (adult) (pediatric): Secondary | ICD-10-CM | POA: Diagnosis not present

## 2022-03-31 HISTORY — PX: CYSTOSCOPY WITH FULGERATION: SHX6638

## 2022-03-31 LAB — BASIC METABOLIC PANEL
Anion gap: 4 — ABNORMAL LOW (ref 5–15)
BUN: 23 mg/dL (ref 8–23)
CO2: 26 mmol/L (ref 22–32)
Calcium: 9 mg/dL (ref 8.9–10.3)
Chloride: 107 mmol/L (ref 98–111)
Creatinine, Ser: 0.52 mg/dL — ABNORMAL LOW (ref 0.61–1.24)
GFR, Estimated: >60 mL/min (ref >60)
Glucose, Bld: 122 mg/dL — ABNORMAL HIGH (ref 70–99)
Potassium: 4.6 mmol/L (ref 3.5–5.1)
Sodium: 137 mmol/L (ref 135–145)

## 2022-03-31 LAB — CBC
HCT: 28 % — ABNORMAL LOW (ref 39.0–52.0)
Hemoglobin: 8.4 g/dL — ABNORMAL LOW (ref 13.0–17.0)
MCH: 29.8 pg (ref 26.0–34.0)
MCHC: 30 g/dL (ref 30.0–36.0)
MCV: 99.3 fL (ref 80.0–100.0)
Platelets: 88 10*3/uL — ABNORMAL LOW (ref 150–400)
RBC: 2.82 MIL/uL — ABNORMAL LOW (ref 4.22–5.81)
RDW: 19.9 % — ABNORMAL HIGH (ref 11.5–15.5)
WBC: 4.8 10*3/uL (ref 4.0–10.5)
nRBC: 0 % (ref 0.0–0.2)

## 2022-03-31 LAB — TYPE AND SCREEN
ABO/RH(D): A POS
Antibody Screen: NEGATIVE
Unit division: 0

## 2022-03-31 LAB — GLUCOSE, CAPILLARY
Glucose-Capillary: 102 mg/dL — ABNORMAL HIGH (ref 70–99)
Glucose-Capillary: 110 mg/dL — ABNORMAL HIGH (ref 70–99)
Glucose-Capillary: 111 mg/dL — ABNORMAL HIGH (ref 70–99)
Glucose-Capillary: 115 mg/dL — ABNORMAL HIGH (ref 70–99)
Glucose-Capillary: 149 mg/dL — ABNORMAL HIGH (ref 70–99)
Glucose-Capillary: 98 mg/dL (ref 70–99)

## 2022-03-31 LAB — BPAM RBC
Blood Product Expiration Date: 202308262359
ISSUE DATE / TIME: 202308062350
Unit Type and Rh: 6200

## 2022-03-31 SURGERY — CYSTOSCOPY, WITH BLADDER FULGURATION
Anesthesia: General

## 2022-03-31 MED ORDER — LINEZOLID 600 MG PO TABS
600.0000 mg | ORAL_TABLET | Freq: Two times a day (BID) | ORAL | Status: DC
  Administered 2022-03-31 – 2022-04-02 (×6): 600 mg
  Filled 2022-03-31 (×7): qty 1

## 2022-03-31 MED ORDER — LIDOCAINE HCL (PF) 2 % IJ SOLN
INTRAMUSCULAR | Status: AC
  Filled 2022-03-31: qty 5

## 2022-03-31 MED ORDER — ONDANSETRON HCL 4 MG/2ML IJ SOLN
INTRAMUSCULAR | Status: DC | PRN
  Administered 2022-03-31: 4 mg via INTRAVENOUS

## 2022-03-31 MED ORDER — STERILE WATER FOR IRRIGATION IR SOLN
Status: DC | PRN
  Administered 2022-03-31: 3000 mL

## 2022-03-31 MED ORDER — PROPOFOL 10 MG/ML IV BOLUS
INTRAVENOUS | Status: AC
  Filled 2022-03-31: qty 20

## 2022-03-31 MED ORDER — FENTANYL CITRATE PF 50 MCG/ML IJ SOSY: 25.0000 ug | PREFILLED_SYRINGE | INTRAMUSCULAR | Status: DC | PRN

## 2022-03-31 MED ORDER — ONDANSETRON HCL 4 MG/2ML IJ SOLN: 4.0000 mg | Freq: Once | INTRAMUSCULAR | Status: DC | PRN

## 2022-03-31 MED ORDER — OXYCODONE HCL 5 MG/5ML PO SOLN: 5.0000 mg | Freq: Once | ORAL | Status: DC | PRN

## 2022-03-31 MED ORDER — PROPOFOL 10 MG/ML IV BOLUS
INTRAVENOUS | Status: DC | PRN
  Administered 2022-03-31: 100 mg via INTRAVENOUS

## 2022-03-31 MED ORDER — ACETAMINOPHEN 160 MG/5ML PO SOLN
1000.0000 mg | Freq: Once | ORAL | Status: AC
Start: 2022-03-31 — End: 2022-03-31
  Administered 2022-03-31: 500 mg via ORAL
  Filled 2022-03-31: qty 40.6

## 2022-03-31 MED ORDER — PHENYLEPHRINE 80 MCG/ML (10ML) SYRINGE FOR IV PUSH (FOR BLOOD PRESSURE SUPPORT)
PREFILLED_SYRINGE | INTRAVENOUS | Status: DC | PRN
  Administered 2022-03-31 (×3): 80 ug via INTRAVENOUS

## 2022-03-31 MED ORDER — AMISULPRIDE (ANTIEMETIC) 5 MG/2ML IV SOLN: 10.0000 mg | Freq: Once | INTRAVENOUS | Status: DC | PRN

## 2022-03-31 MED ORDER — LACTATED RINGERS IV SOLN: INTRAVENOUS | Status: DC

## 2022-03-31 MED ORDER — ONDANSETRON HCL 4 MG/2ML IJ SOLN
INTRAMUSCULAR | Status: AC
  Filled 2022-03-31: qty 2

## 2022-03-31 MED ORDER — LIDOCAINE 2% (20 MG/ML) 5 ML SYRINGE
INTRAMUSCULAR | Status: DC | PRN
  Administered 2022-03-31: 60 mg via INTRAVENOUS

## 2022-03-31 MED ORDER — SODIUM CHLORIDE 0.9 % IR SOLN
Status: DC | PRN
  Administered 2022-03-31: 3000 mL

## 2022-03-31 MED ORDER — OXYCODONE HCL 5 MG PO TABS: 5.0000 mg | ORAL_TABLET | Freq: Once | ORAL | Status: DC | PRN

## 2022-03-31 MED ORDER — FENTANYL CITRATE (PF) 100 MCG/2ML IJ SOLN
INTRAMUSCULAR | Status: DC | PRN
  Administered 2022-03-31 (×2): 25 ug via INTRAVENOUS

## 2022-03-31 MED ORDER — DEXAMETHASONE SODIUM PHOSPHATE 10 MG/ML IJ SOLN
INTRAMUSCULAR | Status: AC
  Filled 2022-03-31: qty 1

## 2022-03-31 MED ORDER — FENTANYL CITRATE (PF) 100 MCG/2ML IJ SOLN
INTRAMUSCULAR | Status: AC
  Filled 2022-03-31: qty 2

## 2022-03-31 MED ORDER — LACTATED RINGERS IV SOLN: INTRAVENOUS | Status: DC | PRN

## 2022-03-31 MED ORDER — ACETAMINOPHEN 500 MG PO TABS: 1000.0000 mg | ORAL_TABLET | Freq: Once | ORAL | Status: DC

## 2022-03-31 SURGICAL SUPPLY — 26 items
BAG COUNTER SPONGE SURGICOUNT (BAG) ×2 IMPLANT
BAG DRN RND TRDRP ANRFLXCHMBR (UROLOGICAL SUPPLIES) ×1
BAG SPNG CNTER NS LX DISP (BAG)
BAG URINE DRAIN 2000ML AR STRL (UROLOGICAL SUPPLIES) ×1 IMPLANT
BAG URO CATCHER STRL LF (MISCELLANEOUS) ×3 IMPLANT
CATH FOLEY 2WAY SLVR  5CC 16FR (CATHETERS) ×2
CATH FOLEY 2WAY SLVR 5CC 16FR (CATHETERS) IMPLANT
CATH FOLEY 3WAY 30CC 22FR (CATHETERS) IMPLANT
CATH URETL OPEN 5X70 (CATHETERS) IMPLANT
DRAPE FOOT SWITCH (DRAPES) ×3 IMPLANT
ELECT REM PT RETURN 15FT ADLT (MISCELLANEOUS) ×3 IMPLANT
GLOVE SURG SS PI 8.0 STRL IVOR (GLOVE) ×1 IMPLANT
GOWN STRL REUS W/ TWL XL LVL3 (GOWN DISPOSABLE) ×2 IMPLANT
GOWN STRL REUS W/TWL XL LVL3 (GOWN DISPOSABLE) ×2
HOLDER FOLEY CATH W/STRAP (MISCELLANEOUS) IMPLANT
KIT TURNOVER KIT A (KITS) ×3 IMPLANT
LOOP CUT BIPOLAR 24F LRG (ELECTROSURGICAL) IMPLANT
MANIFOLD NEPTUNE II (INSTRUMENTS) ×3 IMPLANT
PACK CYSTO (CUSTOM PROCEDURE TRAY) ×3 IMPLANT
SET IRRIG Y TYPE TUR BLADDER L (SET/KITS/TRAYS/PACK) ×2 IMPLANT
SUT ETHILON 3 0 PS 1 (SUTURE) IMPLANT
SYR 30ML LL (SYRINGE) ×1 IMPLANT
SYR TOOMEY IRRIG 70ML (MISCELLANEOUS)
SYRINGE TOOMEY IRRIG 70ML (MISCELLANEOUS) IMPLANT
TUBING CONNECTING 10 (TUBING) ×3 IMPLANT
TUBING UROLOGY SET (TUBING) ×2 IMPLANT

## 2022-03-31 NOTE — Progress Notes (Signed)
Blood transfusion started before transferring to [facility] long.

## 2022-03-31 NOTE — Progress Notes (Signed)
SLP Cancellation Note  Patient Details Name: [name] MRN: [id number] DOB: 04/03/1933   Cancelled treatment:       Reason Eval/Treat Not Completed: SLP in to provide PO trials, however, RN reports pt is currently NPO for procedure. Will continue efforts.  Florella Mcneese B. Quentin Ore, [facility] [place] Speech Language Pathologist Office: [phone]  Shonna Chock 03/31/2022, 1:59 PM

## 2022-03-31 NOTE — Progress Notes (Signed)
Patient ID: KAISER BELLUOMINI, male   DOB: 10-Dec-1932, 88 y.o.   MRN: [id number]    Progress Note from the Palliative Medicine Team at [facility]   Patient Name: [name]        Date: 03/31/2022 DOB: 01/18/1933  Age: 88 y.o. MRN#: [id number] Attending Physician: Oswald Hillock, MD Primary Care Physician: [name], DO Admit Date: 03/24/2022   Medical records reviewed   88 y.o. male  admitted on 03/24/2022 with past medical history significant  PAF on Eliquis, HFrEF (EF 35% by TTE 04/25/2020), CAD s/p CABG, Mobitz 2 second-degree AV block s/p PPM, COPD, T2DM, HTN, HLD, thrombocytopenia, recurrent UTIs, OSA not tolerating CPAP,  AMS.   Was seen for the first time by PMT for consultation on the past hospitalization on Jan 07, 2022.  During that hospitalization a PEG tube was placed for nutritional support.   Patient had another hospitalization in June 2023 secondary to cellulitis and infection around the PEG site.  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  He was treated and follow by urology/Dr. Jeffie Pollock.    Daughter reports that hematuria is chronic over a one year period.   Patient has been cared for in his home since that time.  His daughter [name] is the main caregiver along with wife and other family members.     Today patient is a little bit more alert, his speech remains garbled, and he does follow some simple commands.  Today he had difficulty recognizing and naming his daughters at the bedside.  Patient does not have medical decision-making capacity.   Patient's wife is documented his main decision maker and his advanced directives.   Family face treatment option decisions, advanced directive decisions and anticipatory care needs.  Cystoscopy is planned for today under the direction of Dr. Jeffie Pollock  I met today with family to include wife, three daughters, a grand-daughter and cousin.    Strong encouragement  for family to come together to make  decisions for [name].  Detailed the difference between a full medical intervention path vs a comfort path.  Education offered on hospice benefit; philosophy and eligibility.  All family members are able to verbalize that their main focus for Mr. Oo is comfort and dignity however there is clearly differences of opinion and how to achieve that comfort at this time.  Daughter [name] is strong in her opinion at continued medical interventions to prolong life continue.  She was not in agreement with hospice at home yesterday.  Other families are leaning toward a full comfort path minimizing life prolonging measures.  PMT will continue to support holistically.  Questions and concerns addressed   Discussed with Dr Darrick Meigs and bedside RN   Wadie Lessen NP  Palliative Medicine Team Team Phone # [phone] Pager [phone]

## 2022-03-31 NOTE — Progress Notes (Signed)
Pt arrived via [facility] from [facility]. Pt has no belongings. Blood transfusion in process, started by [facility] prior to transport. Pt foley in place with bloody urine. Pt given a CHG bath, placed on telemetry. Able to answer questions about name/DOB. Skin assessed. Free water feeds resumed.

## 2022-03-31 NOTE — Progress Notes (Signed)
[facility]  PROGRESS NOTE  [name] JSE:[id number] DOB: 11-26-1932 DOA: 03/24/2022 PCP: [name], DO   Brief HPI:   88 year old male with a history of paroxysmal atrial fibrillation, CAD, remote history of prostate cancer, dyslipidemia, essential hypertension, OSA intolerant of CPAP came to [facility] ED on 7/31 with vomiting, abdominal distention recurrent hematuria.  Patient was apparently in good health until February 2023 in which she sustained a pelvic fracture and has been chair bound since.  In May 2023 he had a PEG placed secondary to poor oral intake and on 01/24/2022 he developed cellulitis and infection around his PEG tube site.  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  Patient has been seen by urology initially discontinued Foley catheter but need to be replaced due to urinary retention.  Additionally patient with a G-tube in place on Jevity 90 cc/h and 148 cc of free water every 4 hours.    In the ED was found to have hemoglobin of 6.  Transfuse 1 unit PRBC, noted to have hematuria.  Urology was consulted.  Subjective   Patient seen and examined, no new complaints.  Urology following, plan to take him for cystoscopy today for recurrent hematuria.   Assessment/Plan:   Acute hypoxemic respiratory failure -Aspiration pneumonia -Patient was hypoxemic in the ED with SpO2 87% -CT chest findings were consistent with aspiration pneumonia -Patient started on linezolid, plan for 10-day course -Continue aspiration precautions  Hematuria -Secondary to acute on chronic cystitis -VRE UTI -Recently treated outpatient for VRE with linezolid -On admission hemoglobin was 6.0; was transfused 1 unit PRBC -Patient was seen by urology; initially urology felt no need for further intervention or CBI at this time, treat conservatively  blood transfusion and recommend Foley catheter exchanges every 3 weeks -Now plan to take him to cystoscopy for recurrent hematuria -Continue linezolid for 10-day course  Chronic thrombocytopenia -Platelets 88,000  Sacral/heel decubitus ulcer -Local wound care  Hypophosphatemia -Replete  Diabetes mellitus type 2 -Patient on metformin 500 mg daily as outpatient -A1c was 5.2 on 01/06/2022 -Metformin on hold -Continue sliding scale insulin NovoLog -Well-controlled  Depression -Continue venlafaxine 37.5 mg nightly  Adult failure to thrive -Patient has PEG tube in place -Progressive decline since February 2023 -Overall poor prognosis -Dr. [place] discussed with patient's PCP Dr. Lajuana Ripple who recommended and agreed with palliative care consultation. -Perative care consulted for goals of care and medical decision making   Medications     acetaminophen  1,000 mg Oral Once   amiodarone  200 mg Per Tube Daily   Chlorhexidine Gluconate Cloth  6 each Topical Daily   doxazosin  1 mg Per Tube Daily   famotidine  20 mg Per Tube BID   ferrous gluconate  324 mg Oral QPM   fluconazole  100 mg Per Tube Daily   free water  150 mL Per Tube Q4H   insulin aspart  0-5 Units Subcutaneous QHS   insulin aspart  0-9 Units Subcutaneous Q4H   linezolid  600 mg Per Tube Q12H   mometasone-formoterol  2 puff Inhalation BID   nutrition supplement (JUVEN)  1 packet Per Tube BID BM   venlafaxine  37.5 mg Per Tube QHS   Zinc Oxide   Topical BID     Data Reviewed:   CBG:  Recent Labs  Lab 03/30/22 2355 03/31/22 0051 03/31/22 0501 03/31/22 0742 03/31/22 1120  GLUCAP 196* 149* 110* 115* 111*    SpO2: 96 % O2 Flow Rate (L/min): 2 L/min    Vitals:   03/31/22 0213 03/31/22 0504 03/31/22 0749 03/31/22 1327  BP: (!) 134/58 102/64  (!) 131/53  Pulse: 70 65  68  Resp: '16 17  18  '$ Temp: 97.8 F (36.6 C) 97.8 F (36.6 C)  97.6 F (36.4 C)  TempSrc: Axillary Oral  Oral  SpO2: 97% 99% 99%  96%  Weight:      Height:          Data Reviewed:  Basic Metabolic Panel: Recent Labs  Lab 03/27/22 0126 03/28/22 0116 03/29/22 0102 03/30/22 0113 03/31/22 0403  NA 139 139 134* 132* 137  K 4.0 3.9 4.8 4.2 4.6  CL 111 109 106 102 107  CO2 '24 24 22 24 26  '$ GLUCOSE 180* 245* 181* 200* 122*  BUN 29* 32* 31* 28* 23  CREATININE 0.77 0.68 0.67 0.64 0.52*  CALCIUM 8.7* 8.8* 8.6* 8.7* 9.0  MG 1.9 1.9 1.9  --   --   PHOS 2.2* 2.2* 2.8  --   --     CBC: Recent Labs  Lab 03/24/22 1905 03/25/22 1057 03/26/22 0340 03/26/22 1509 03/27/22 0126 03/27/22 1845 03/28/22 0116 03/29/22 0102 03/30/22 0113 03/31/22 0403  WBC 8.5   < > 4.6  --  4.2  --  4.1 5.1 3.8* 4.8  NEUTROABS 6.6  --  2.6  --   --   --   --   --   --   --   HGB 6.0*   < > 6.1*   < > 7.3* 7.5* 7.3* 8.3* 7.2* 8.4*  HCT 21.1*   < > 21.1*   < > 24.3* 25.0* 24.5* 27.4* 24.4* 28.0*  MCV 105.5*   < > 103.9*  --  97.2  --  96.5 97.2 98.4 99.3  PLT 124*   < > 88*  --  82*  --  84* 84* 85* 88*   < > = values in this interval not displayed.    LFT Recent Labs  Lab 03/24/22 1905 03/25/22 1057  AST 36 27  ALT 26 24  ALKPHOS 85 79  BILITOT 0.5 0.7  PROT 5.3* 5.1*  ALBUMIN 2.4* 2.3*     Antibiotics: Anti-infectives (From admission, onward)    Start     Dose/Rate Route Frequency Ordered Stop   03/31/22 1000  linezolid (ZYVOX) tablet 600 mg        600 mg Per Tube Every 12 hours 03/31/22 0741 04/04/22 0959   03/30/22 1000  fluconazole (DIFLUCAN) tablet 100 mg       See Hyperspace for full Linked Orders Report.   100 mg Per Tube Daily 03/29/22 1517 04/12/22 0959   03/29/22 1615  fluconazole (DIFLUCAN) tablet 200 mg       See Hyperspace for full Linked Orders Report.   200 mg Per Tube  Once 03/29/22 1517 03/29/22 1705   03/25/22 1000  ceFEPIme (MAXIPIME) 2 g in sodium chloride 0.9 % 100 mL IVPB  Status:  Discontinued        2 g 200 mL/hr over 30 Minutes Intravenous Every 12 hours 03/24/22 2226 03/27/22 0908    03/25/22 1000  linezolid (ZYVOX) IVPB 600 mg  Status:  Discontinued        600 mg 300 mL/hr over 60 Minutes Intravenous Every 12 hours 03/25/22 0559 03/31/22 0741   03/24/22 2045  ceFEPIme (MAXIPIME) 2 g in  sodium chloride 0.9 % 100 mL IVPB        2 g 200 mL/hr over 30 Minutes Intravenous  Once 03/24/22 2036 03/24/22 2200   03/24/22 2045  metroNIDAZOLE (FLAGYL) IVPB 500 mg        500 mg 100 mL/hr over 60 Minutes Intravenous  Once 03/24/22 2036 03/24/22 2329        DVT prophylaxis: SCDs  Code Status: Full code  Family Communication: No family at bedside   CONSULTS urology, palliative care   Objective    Physical Examination:   General-appears in no acute distress Heart-S1-S2, regular, no murmur auscultated Lungs-clear to auscultation bilaterally, no wheezing or crackles auscultated Abdomen-soft, nontender, no organomegaly Extremities-no edema in the lower extremities Neuro-alert, oriented x3, no focal deficit noted   Status is: Inpatient:             Oswald Hillock   [facility] If 7PM-7AM, please contact night-coverage at [email or web address], Office  [phone]   03/31/2022, 2:38 PM  LOS: 6 days

## 2022-03-31 NOTE — TOC Progression Note (Addendum)
Transition of Care [facility]) - Progression Note    Patient Details  Name: [name] MRN: [id number] Date of Birth: Jun 24, 1933  [facility]) CM/SW Contact  Purcell Mouton, RN Phone Number: 03/31/2022, 1:52 PM  Clinical Narrative:     TOC will continue to follow for discharge needs. Pt's daughter is an ICU RN.        Expected Discharge Plan and Services     Discharge Planning Services: CM Consult   Living arrangements for the past 2 months: [place]                   DME Agency: NA                   Social Determinants of Health (SDOH) Interventions    Readmission Risk Interventions    01/16/2022    2:12 PM  Readmission Risk Prevention Plan  Transportation Screening Complete  Medication Review (RN Care Manager) Referral to Pharmacy  PCP or Specialist appointment within 3-5 days of discharge Complete  HRI or [place] Complete  SW Recovery Care/Counseling Consult Complete  [place] Not Applicable

## 2022-03-31 NOTE — Anesthesia Postprocedure Evaluation (Signed)
Anesthesia Post Note  Patient: [name]  Procedure(s) Performed: CYSTOSCOPY/  FULGERATION     Patient location during evaluation: PACU Anesthesia Type: General Level of consciousness: awake and alert Pain management: pain level controlled Vital Signs Assessment: post-procedure vital signs reviewed and stable Respiratory status: spontaneous breathing, nonlabored ventilation, respiratory function stable and patient connected to nasal cannula oxygen Cardiovascular status: blood pressure returned to baseline and stable Postop Assessment: no apparent nausea or vomiting Anesthetic complications: no   No notable events documented.  Last Vitals:  Vitals:   03/31/22 1730 03/31/22 1817  BP: (!) 108/57 (!) 106/48  Pulse: 68 63  Resp: 12 18  Temp:  36.5 C  SpO2: 94% 97%    Last Pain:  Vitals:   03/31/22 1817  TempSrc: Oral  PainSc:                  Tiajuana Amass

## 2022-03-31 NOTE — Progress Notes (Signed)
PHARMACIST - PHYSICIAN COMMUNICATION CONCERNING: Antibiotic IV to Oral Route Change Policy  RECOMMENDATION: This patient is receiving zyvox by the intravenous route.  Based on criteria approved by the Pharmacy and Therapeutics Committee, the antibiotic(s) is/are being converted to the equivalent oral dose form(s).   DESCRIPTION: These criteria include: Patient being treated for a respiratory tract infection, urinary tract infection, cellulitis or clostridium difficile associated diarrhea if on metronidazole The patient is not neutropenic and does not exhibit a GI malabsorption state The patient is eating (either orally or via tube) and/or has been taking other orally administered medications for a least 24 hours The patient is improving clinically and has a Tmax < 100.5  If you have questions about this conversion, please contact the Pharmacy Department  '[]'$   [phone] )  [facility] '[]'$   [phone] )  [facility] '[]'$   [phone] )  [facility] '[]'$   [phone] )  [facility] '[x]'$   [phone] )  [facility]

## 2022-03-31 NOTE — Interval H&P Note (Signed)
History and Physical Interval Note: Urine is clear but he needs cystoscopy to assess the cause of the hematuria.   03/31/2022 3:53 PM  [name]  has presented today for surgery, with the diagnosis of HEMATURIA.  The various methods of treatment have been discussed with the patient and family. After consideration of risks, benefits and other options for treatment, the patient has consented to  Procedure(s): CYSTOSCOPY/ CLOT EVACUATION WITH FULGERATION (N/A) as a surgical intervention.  The patient's history has been reviewed, patient examined, no change in status, stable for surgery.  I have reviewed the patient's chart and labs.  Questions were answered to the patient's satisfaction.     Irine Seal

## 2022-03-31 NOTE — Anesthesia Preprocedure Evaluation (Addendum)
Anesthesia Evaluation  Patient identified by MRN, date of birth, ID band Patient awake and Patient confused    Reviewed: Allergy & Precautions, NPO status , Patient's Chart, lab work & pertinent test results  Airway Mallampati: II  TM Distance: >3 FB Neck ROM: Full    Dental   Pulmonary sleep apnea and Continuous Positive Airway Pressure Ventilation , COPD, former smoker,    breath sounds clear to auscultation       Cardiovascular hypertension, + CAD and + CABG (2012)  + dysrhythmias Atrial Fibrillation  Rhythm:Regular Rate:Normal     Neuro/Psych CVA    GI/Hepatic   Endo/Other  diabetes, Type 2  Renal/GU Lab Results      Component                Value               Date                      CREATININE               0.52 (L)            03/31/2022                BUN                      23                  03/31/2022                NA                       137                 03/31/2022                K                        4.6                 03/31/2022                 Prostate CA hx    Musculoskeletal  (+) Arthritis ,   Abdominal   Peds  Hematology  (+) Blood dyscrasia, anemia , Lab Results      Component                Value               Date                      WBC                      4.8                 03/31/2022                HGB                      8.4 (L)             03/31/2022                HCT  28.0 (L)            03/31/2022                MCV                      99.3                03/31/2022                PLT                      88 (L)              03/31/2022              Anesthesia Other Findings   Reproductive/Obstetrics                            Anesthesia Physical Anesthesia Plan  ASA: 4  Anesthesia Plan: General   Post-op Pain Management: Minimal or no pain anticipated   Induction: Intravenous  PONV Risk Score and Plan: 3 and  Treatment may vary due to age or medical condition and Ondansetron  Airway Management Planned: LMA  Additional Equipment:   Intra-op Plan:   Post-operative Plan: Extubation in OR  Informed Consent: I have reviewed the patients History and Physical, chart, labs and discussed the procedure including the risks, benefits and alternatives for the proposed anesthesia with the patient or authorized representative who has indicated his/her understanding and acceptance.     Dental advisory given and Consent reviewed with POA  Plan Discussed with:   Anesthesia Plan Comments:        Anesthesia Quick Evaluation

## 2022-03-31 NOTE — Care Management Important Message (Signed)
Important Message  Patient Details IM Letter given to the Patient. Name: [name] MRN: [id number] Date of Birth: Oct 27, 1932   Medicare Important Message Given:  Yes     [name] 03/31/2022, 12:55 PM

## 2022-03-31 NOTE — Op Note (Signed)
Procedure: Cystoscopy with fulguration.  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  Postop diagnosis: Same.  Surgeon: Dr. Irine Seal.  Anesthesia: General.  Specimen: None.  Drains: 23 French Foley catheter.  EBL: None.  Complications: None.  Indications: The patient is an 88 year old male with a prior history of radiation therapy for prostate cancer who has had several episodes of recurrent gross hematuria associated with VRE infections.  He was started on Zyvox and his urine is cleared but it was felt that cystoscopy was indicated to rule out correctable cause of the hematuria.  He has had issues with retention and a foley has been in for about 2 weeks.  Procedure: He was taken to the operating room and had been covered with Zyvox earlier in the day.  A general anesthetic was induced.  He was placed in lithotomy position and fitted with PAS hose.  His perineum and genitalia were prepped with Betadine solution.  He was draped in usual sterile fashion.  Cystoscopy was performed using the 21 [place] scope and the 30 degree lens.  Examination revealed a normal urethra.  The external sphincter was intact.  The prostatic urethra had bilobar hyperplasia with a slightly high bladder neck with some radiation changes and neovascularity at the bladder neck but no active bleeding.  Examination of bladder revealed mild trabeculation.  There was little catheter irritation on the posterior wall but no papillary tumors were seen.  There were some radiation changes with neovascularity primarily at the bladder neck and base worse on the left than the right.  There was a small hemorrhagic lesion approximately 8 mm in size just medial to the left ureteral orifice that appeared consistent with a submucosal hemorrhage and was felt to likely be the source of his recurrent hematuria.  The right ureteral orifice was unremarkable.  A Bugbee electrode was then used to generously  fulgurate the hemorrhagic lesion adjacent to the left ureteral orifice with great care being taken to avoid the orifice.  Once lesion had been fulgurated, the bladder was decompressed and no bleeding was identified.  The cystoscope was removed and a fresh 16 French Foley catheter was inserted.  The balloon was filled with 10 mL of sterile fluid and the catheter was placed to drainage bag.  He was taken down from lithotomy position, his anesthetic was reversed and he was moved recovery in stable condition.  There were no complications.

## 2022-03-31 NOTE — Transfer of Care (Signed)
Immediate Anesthesia Transfer of Care Note  Patient: [name]  Procedure(s) Performed: CYSTOSCOPY/  FULGERATION  Patient Location: PACU  Anesthesia Type:General  Level of Consciousness: drowsy and patient cooperative  Airway & Oxygen Therapy: Patient Spontanous Breathing and Patient connected to face mask oxygen  Post-op Assessment: Report given to RN and Post -op Vital signs reviewed and stable  Post vital signs: Reviewed and stable  Last Vitals:  Vitals Value Taken Time  BP 121/54 03/31/22 1652  Temp    Pulse 68 03/31/22 1655  Resp 16 03/31/22 1655  SpO2 100 % 03/31/22 1655  Vitals shown include unvalidated device data.  Last Pain:  Vitals:   03/31/22 1527  TempSrc:   PainSc: 9       Patients Stated Pain Goal: 0 (41/93/79 0240)  Complications: No notable events documented.

## 2022-03-31 NOTE — Anesthesia Procedure Notes (Signed)
Procedure Name: LMA Insertion Date/Time: 03/31/2022 4:22 PM  Performed by: Sharlette Dense, CRNAPatient Re-evaluated:Patient Re-evaluated prior to induction Oxygen Delivery Method: Circle system utilized Preoxygenation: Pre-oxygenation with 100% oxygen Induction Type: IV induction LMA: LMA with gastric port inserted LMA Size: 4.0 Number of attempts: 1 Placement Confirmation: positive ETCO2 and breath sounds checked- equal and bilateral Tube secured with: Tape Dental Injury: Teeth and Oropharynx as per pre-operative assessment

## 2022-04-01 ENCOUNTER — Encounter (HOSPITAL_COMMUNITY): Payer: Self-pay | Admitting: Urology

## 2022-04-01 DIAGNOSIS — N179 Acute kidney failure, unspecified: Secondary | ICD-10-CM | POA: Diagnosis not present

## 2022-04-01 DIAGNOSIS — N3001 Acute cystitis with hematuria: Secondary | ICD-10-CM | POA: Diagnosis not present

## 2022-04-01 DIAGNOSIS — J9601 Acute respiratory failure with hypoxia: Secondary | ICD-10-CM | POA: Diagnosis not present

## 2022-04-01 DIAGNOSIS — R627 Adult failure to thrive: Secondary | ICD-10-CM | POA: Diagnosis not present

## 2022-04-01 DIAGNOSIS — D649 Anemia, unspecified: Secondary | ICD-10-CM | POA: Diagnosis not present

## 2022-04-01 LAB — CBC
HCT: 25.6 % — ABNORMAL LOW (ref 39.0–52.0)
Hemoglobin: 7.7 g/dL — ABNORMAL LOW (ref 13.0–17.0)
MCH: 29.8 pg (ref 26.0–34.0)
MCHC: 30.1 g/dL (ref 30.0–36.0)
MCV: 99.2 fL (ref 80.0–100.0)
Platelets: 81 10*3/uL — ABNORMAL LOW (ref 150–400)
RBC: 2.58 MIL/uL — ABNORMAL LOW (ref 4.22–5.81)
RDW: 19.9 % — ABNORMAL HIGH (ref 11.5–15.5)
WBC: 3.8 10*3/uL — ABNORMAL LOW (ref 4.0–10.5)
nRBC: 0 % (ref 0.0–0.2)

## 2022-04-01 LAB — COMPREHENSIVE METABOLIC PANEL
ALT: 26 U/L (ref 0–44)
AST: 44 U/L — ABNORMAL HIGH (ref 15–41)
Albumin: 2.2 g/dL — ABNORMAL LOW (ref 3.5–5.0)
Alkaline Phosphatase: 75 U/L (ref 38–126)
Anion gap: 6 (ref 5–15)
BUN: 23 mg/dL (ref 8–23)
CO2: 27 mmol/L (ref 22–32)
Calcium: 8.9 mg/dL (ref 8.9–10.3)
Chloride: 105 mmol/L (ref 98–111)
Creatinine, Ser: 0.68 mg/dL (ref 0.61–1.24)
GFR, Estimated: >60 mL/min (ref >60)
Glucose, Bld: 140 mg/dL — ABNORMAL HIGH (ref 70–99)
Potassium: 5 mmol/L (ref 3.5–5.1)
Sodium: 138 mmol/L (ref 135–145)
Total Bilirubin: 0.6 mg/dL (ref 0.3–1.2)
Total Protein: 4.9 g/dL — ABNORMAL LOW (ref 6.5–8.1)

## 2022-04-01 LAB — GLUCOSE, CAPILLARY
Glucose-Capillary: 141 mg/dL — ABNORMAL HIGH (ref 70–99)
Glucose-Capillary: 149 mg/dL — ABNORMAL HIGH (ref 70–99)
Glucose-Capillary: 150 mg/dL — ABNORMAL HIGH (ref 70–99)
Glucose-Capillary: 157 mg/dL — ABNORMAL HIGH (ref 70–99)
Glucose-Capillary: 163 mg/dL — ABNORMAL HIGH (ref 70–99)
Glucose-Capillary: 167 mg/dL — ABNORMAL HIGH (ref 70–99)

## 2022-04-01 MED ORDER — JEVITY 1.5 CAL/FIBER PO LIQD
1000.0000 mL | ORAL | Status: DC
  Administered 2022-04-01 – 2022-04-05 (×4): 1000 mL
  Filled 2022-04-01 (×10): qty 1000

## 2022-04-01 MED ORDER — PROSOURCE TF20 ENFIT COMPATIBL EN LIQD
60.0000 mL | Freq: Every day | ENTERAL | Status: DC
Start: 2022-04-01 — End: 2022-04-07
  Administered 2022-04-01 – 2022-04-06 (×6): 60 mL
  Filled 2022-04-01 (×6): qty 60

## 2022-04-01 MED ORDER — JUVEN PO PACK
1.0000 | PACK | Freq: Two times a day (BID) | ORAL | Status: DC
  Administered 2022-04-01 – 2022-04-06 (×10): 1
  Filled 2022-04-01 (×12): qty 1

## 2022-04-01 NOTE — Progress Notes (Signed)
SLP planned follow up to determination for po readiness.  Palliative was meeting with family at that time - From chart review, pt imaging is concerning for pna, congestion with poor secretion management noted.  Appears care plan remains full code/treatment at this time.  Given above issues, recommend MBS to allow instrumental evaluation of swallow function to help in determining least restrictive diet and potential helpful mitigation strategies.  Note pt has cognitive deficits that may impair ability to follow directions.  Will plan MBS tomorrow 04/02/2022 with family present.  RN informed and she advised family.  Order placed. Recommend continue NPO pending MBS.   Kathleen Lime, MS [facility] SLP Acute Rehab Services Office [phone] Pager [phone]

## 2022-04-01 NOTE — Progress Notes (Signed)
1 Day Post-Op  Subjective: Ana's urine is clear today.  He has been leaking a little around the foley, possibly from spasms.   ROS:  Review of Systems  Unable to perform ROS: Mental acuity    Anti-infectives: Anti-infectives (From admission, onward)    Start     Dose/Rate Route Frequency Ordered Stop   03/31/22 1000  linezolid (ZYVOX) tablet 600 mg        600 mg Per Tube Every 12 hours 03/31/22 0741 04/04/22 0959   03/30/22 1000  fluconazole (DIFLUCAN) tablet 100 mg       See Hyperspace for full Linked Orders Report.   100 mg Per Tube Daily 03/29/22 1517 04/12/22 0959   03/29/22 1615  fluconazole (DIFLUCAN) tablet 200 mg       See Hyperspace for full Linked Orders Report.   200 mg Per Tube  Once 03/29/22 1517 03/29/22 1705   03/25/22 1000  ceFEPIme (MAXIPIME) 2 g in sodium chloride 0.9 % 100 mL IVPB  Status:  Discontinued        2 g 200 mL/hr over 30 Minutes Intravenous Every 12 hours 03/24/22 2226 03/27/22 0908   03/25/22 1000  linezolid (ZYVOX) IVPB 600 mg  Status:  Discontinued        600 mg 300 mL/hr over 60 Minutes Intravenous Every 12 hours 03/25/22 0559 03/31/22 0741   03/24/22 2045  ceFEPIme (MAXIPIME) 2 g in sodium chloride 0.9 % 100 mL IVPB        2 g 200 mL/hr over 30 Minutes Intravenous  Once 03/24/22 2036 03/24/22 2200   03/24/22 2045  metroNIDAZOLE (FLAGYL) IVPB 500 mg        500 mg 100 mL/hr over 60 Minutes Intravenous  Once 03/24/22 2036 03/24/22 2329       Current Facility-Administered Medications  Medication Dose Route Frequency Provider Last Rate Last Admin   acetaminophen (TYLENOL) tablet 650 mg  650 mg Oral Q6H PRN Crosley, Debby, MD   650 mg at 03/30/22 0006   Or   acetaminophen (TYLENOL) suppository 650 mg  650 mg Rectal Q6H PRN Crosley, Debby, MD       amiodarone (PACERONE) tablet 200 mg  200 mg Per Tube Daily Crosley, Debby, MD   200 mg at 03/31/22 1133   Chlorhexidine Gluconate Cloth 2 % PADS 6 each  6 each Topical Daily [place] Eric J, DO   6  each at 03/31/22 1406   doxazosin (CARDURA) tablet 1 mg  1 mg Per Tube Daily Crosley, Debby, MD   1 mg at 03/31/22 1133   famotidine (PEPCID) tablet 20 mg  20 mg Per Tube BID Crosley, Debby, MD   20 mg at 03/31/22 2203   feeding supplement (JEVITY 1.5 CAL/FIBER) liquid 1,000 mL  1,000 mL Per Tube Continuous [place] Eric J, DO 60 mL/hr at 03/31/22 2000 1,000 mL at 03/31/22 2000   ferrous gluconate (FERGON) tablet 324 mg  324 mg Oral QPM Crosley, Debby, MD   324 mg at 03/30/22 1731   fluconazole (DIFLUCAN) tablet 100 mg  100 mg Per Tube Daily [place] Donnamarie Poag, DO   100 mg at 03/31/22 1133   free water 150 mL  150 mL Per Tube Q4H [place] Eric J, DO   150 mL at 04/01/22 0358   insulin aspart (novoLOG) injection 0-5 Units  0-5 Units Subcutaneous QHS Crosley, Debby, MD       insulin aspart (novoLOG) injection 0-9 Units  0-9 Units Subcutaneous Q4H Quintella Baton, MD  1 Units at 04/01/22 0354   linezolid (ZYVOX) tablet 600 mg  600 mg Per Tube Q12H Eudelia Bunch, RPH   600 mg at 03/31/22 2203   mometasone-formoterol (DULERA) 200-5 MCG/ACT inhaler 2 puff  2 puff Inhalation BID Quintella Baton, MD   2 puff at 04/01/22 0732   nutrition supplement (JUVEN) (JUVEN) powder packet 1 packet  1 packet Per Tube BID BM [place] Donnamarie Poag, DO   1 packet at 03/30/22 1415   ondansetron (ZOFRAN) tablet 4 mg  4 mg Oral Q6H PRN Quintella Baton, MD       Or   ondansetron (ZOFRAN) injection 4 mg  4 mg Intravenous Q6H PRN Crosley, Debby, MD       oxyCODONE (ROXICODONE) 5 MG/5ML solution 5 mg  5 mg Per Tube Q6H PRN [place] Eric J, DO       senna-docusate (Senokot-S) tablet 1 tablet  1 tablet Oral QHS PRN Quintella Baton, MD       venlafaxine (EFFEXOR) tablet 37.5 mg  37.5 mg Per Tube QHS [place] Eric J, DO   37.5 mg at 03/31/22 2203   Zinc Oxide (TRIPLE PASTE) 12.8 % ointment   Topical BID Quintella Baton, MD   Given at 03/30/22 2150     Objective: Vital signs in last 24 hours: Temp:  [97.6 F (36.4 C)-98.2 F (36.8 C)]  97.9 F (36.6 C) (08/08 0439) Pulse Rate:  [63-72] 70 (08/08 0439) Resp:  [12-18] 18 (08/08 0439) BP: (106-131)/(48-57) 116/49 (08/08 0439) SpO2:  [94 %-100 %] 99 % (08/08 0732)  Intake/Output from previous day: 08/07 0701 - 08/08 0700 In: 720.8 [I.V.:720.8] Out: 2210 [Urine:2200; Blood:10] Intake/Output this shift: No intake/output data recorded.   Physical Exam Vitals reviewed.  Constitutional:      Appearance: Normal appearance.  Pulmonary:     Effort: No respiratory distress.  Genitourinary:    Comments: Urine in foley tube is clear.    Lab Results:  Recent Labs    03/31/22 0403 04/01/22 0411  WBC 4.8 3.8*  HGB 8.4* 7.7*  HCT 28.0* 25.6*  PLT 88* 81*    BMET Recent Labs    03/31/22 0403 04/01/22 0411  NA 137 138  K 4.6 5.0  CL 107 105  CO2 26 27  GLUCOSE 122* 140*  BUN 23 23  CREATININE 0.52* 0.68  CALCIUM 9.0 8.9    PT/INR No results for input(s): "LABPROT", "INR" in the last 72 hours. ABG No results for input(s): "PHART", "HCO3" in the last 72 hours.  Invalid input(s): "PCO2", "PO2"  Studies/Results: No results found. CT AP 03/24/22. IMPRESSION: 1. Negative for acute pulmonary embolism. 2. Bronchial wall thickening, mucous plugging and scattered nodular opacities greatest in the dependent lungs. Findings are compatible with bronchitis/bronchiolitis with a distribution suspicious for aspiration. 3. Irregular bladder wall thickening. This may be due to bladder decompression however cystitis is not excluded. Recommend correlation with urinalysis. 4. Aortic and coronary artery atherosclerotic calcification. 5. 2.1 cm left common iliac artery aneurysm. 6. Healing fractures of the right superior and inferior pubic rami right sacral ala posterior right iliac bone.     Electronically Signed   By: Placido Sou M.D.   On: 03/24/2022 23:07    Assessment and Plan: Gross hematuria appears to have been associated with a single point of  hemorrhage and the recurrent VRE.  His UA is clear following fulguration.    Urinary retention.  He didn't have significant obstruction on cystoscopy, so I will give him  a voiding trial today.         LOS: 7 days    Irine Seal 04/01/2022 [phone] Patient ID: Clifton Custard, male   DOB: October 20, 1932, 88 y.o.   MRN: [id number] Patient ID: KEEVIN PANEBIANCO, male   DOB: 01-05-1933, 88 y.o.   MRN: [id number]

## 2022-04-01 NOTE — Progress Notes (Signed)
[facility]  PROGRESS NOTE  [name] HYQ:[id number] DOB: Nov 04, 1932 DOA: 03/24/2022 PCP: [name], DO   Brief HPI:   88 year old male with a history of paroxysmal atrial fibrillation, CAD, remote history of prostate cancer, dyslipidemia, essential hypertension, OSA intolerant of CPAP came to [facility] ED on 7/31 with vomiting, abdominal distention recurrent hematuria.  Patient was apparently in good health until February 2023 in which she sustained a pelvic fracture and has been chair bound since.  In May 2023 he had a PEG placed secondary to poor oral intake and on 01/24/2022 he developed cellulitis and infection around his PEG tube site.  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  Patient has been seen by urology initially discontinued Foley catheter but need to be replaced due to urinary retention.  Additionally patient with a G-tube in place on Jevity 90 cc/h and 148 cc of free water every 4 hours.    In the ED was found to have hemoglobin of 6.  Transfuse 1 unit PRBC, noted to have hematuria.    Urology was consulted.underwent cystoscopy yesterday, found to have small hemorrhagic lesion which was fulgurated.   Subjective   Patient seen and examined, underwent cystoscopy yesterday, found to have small hemorrhagic lesion which was fulgurated.  Urine is clear today.   Assessment/Plan:   Acute hypoxemic respiratory failure -Aspiration pneumonia -Patient was hypoxemic in the ED with SpO2 87% -CT chest findings were consistent with aspiration pneumonia -Patient started on linezolid, plan for 5-day course -Continue aspiration precautions  Hematuria -Secondary to acute on chronic cystitis -VRE UTI -Recently treated outpatient for VRE with linezolid -On admission hemoglobin was 6.0; was transfused 1 unit PRBC -Patient was seen by  urology; initially urology felt no need for further intervention or CBI at this time, treat conservatively blood transfusion and recommend Foley catheter exchanges every 3 weeks -Underwent cystoscopy yesterday found to have small hemorrhagic lesion which was fulgurated.   -Hematuria resolved. -Urology following -Continue linezolid as above  Chronic thrombocytopenia -Platelets 88,000  Sacral/heel decubitus ulcer -Local wound care  Hypophosphatemia -Replete  Diabetes mellitus type 2 -Patient on metformin 500 mg daily as outpatient -A1c was 5.2 on 01/06/2022 -Metformin on hold -Continue sliding scale insulin NovoLog -Well-controlled  Depression -Continue venlafaxine 37.5 mg nightly  Adult failure to thrive -Patient has PEG tube in place -Progressive decline since February 2023 -Overall poor prognosis -Dr. [place] discussed with patient's PCP Dr. Lajuana Ripple who recommended and agreed with palliative care consultation. -Perative care consulted for goals of care and medical decision making   Medications     amiodarone  200 mg Per Tube Daily   Chlorhexidine Gluconate Cloth  6 each Topical Daily   doxazosin  1 mg Per Tube Daily   famotidine  20 mg Per Tube BID   feeding supplement (PROSource TF20)  60 mL Per Tube Daily   ferrous gluconate  324 mg Oral QPM   fluconazole  100 mg Per Tube Daily   free water  150 mL Per Tube Q4H   insulin aspart  0-5 Units Subcutaneous QHS   insulin aspart  0-9 Units Subcutaneous Q4H   linezolid  600 mg Per Tube Q12H   mometasone-formoterol  2 puff Inhalation BID   nutrition supplement (JUVEN)  1 packet Per Tube BID BM   venlafaxine  37.5 mg Per Tube QHS   Zinc Oxide  Topical BID     Data Reviewed:   CBG:  Recent Labs  Lab 03/31/22 2151 03/31/22 2352 04/01/22 0345 04/01/22 0734 04/01/22 1129  GLUCAP 102* 98 141* 157* 167*    SpO2: 100 % O2 Flow Rate (L/min): 2 L/min    Vitals:   03/31/22 2154 04/01/22 0439 04/01/22 0732  04/01/22 1351  BP: (!) 109/55 (!) 116/49  (!) 115/47  Pulse: 72 70  77  Resp: '18 18  18  '$ Temp: 98.2 F (36.8 C) 97.9 F (36.6 C)  98.5 F (36.9 C)  TempSrc: Oral   Oral  SpO2: 97% 98% 99% 100%  Weight:      Height:          Data Reviewed:  Basic Metabolic Panel: Recent Labs  Lab 03/27/22 0126 03/28/22 0116 03/29/22 0102 03/30/22 0113 03/31/22 0403 04/01/22 0411  NA 139 139 134* 132* 137 138  K 4.0 3.9 4.8 4.2 4.6 5.0  CL 111 109 106 102 107 105  CO2 '24 24 22 24 26 27  '$ GLUCOSE 180* 245* 181* 200* 122* 140*  BUN 29* 32* 31* 28* 23 23  CREATININE 0.77 0.68 0.67 0.64 0.52* 0.68  CALCIUM 8.7* 8.8* 8.6* 8.7* 9.0 8.9  MG 1.9 1.9 1.9  --   --   --   PHOS 2.2* 2.2* 2.8  --   --   --     CBC: Recent Labs  Lab 03/26/22 0340 03/26/22 1509 03/28/22 0116 03/29/22 0102 03/30/22 0113 03/31/22 0403 04/01/22 0411  WBC 4.6   < > 4.1 5.1 3.8* 4.8 3.8*  NEUTROABS 2.6  --   --   --   --   --   --   HGB 6.1*   < > 7.3* 8.3* 7.2* 8.4* 7.7*  HCT 21.1*   < > 24.5* 27.4* 24.4* 28.0* 25.6*  MCV 103.9*   < > 96.5 97.2 98.4 99.3 99.2  PLT 88*   < > 84* 84* 85* 88* 81*   < > = values in this interval not displayed.    LFT Recent Labs  Lab 04/01/22 0411  AST 44*  ALT 26  ALKPHOS 75  BILITOT 0.6  PROT 4.9*  ALBUMIN 2.2*     Antibiotics: Anti-infectives (From admission, onward)    Start     Dose/Rate Route Frequency Ordered Stop   03/31/22 1000  linezolid (ZYVOX) tablet 600 mg        600 mg Per Tube Every 12 hours 03/31/22 0741 04/04/22 0959   03/30/22 1000  fluconazole (DIFLUCAN) tablet 100 mg       See Hyperspace for full Linked Orders Report.   100 mg Per Tube Daily 03/29/22 1517 04/12/22 0959   03/29/22 1615  fluconazole (DIFLUCAN) tablet 200 mg       See Hyperspace for full Linked Orders Report.   200 mg Per Tube  Once 03/29/22 1517 03/29/22 1705   03/25/22 1000  ceFEPIme (MAXIPIME) 2 g in sodium chloride 0.9 % 100 mL IVPB  Status:  Discontinued        2 g 200  mL/hr over 30 Minutes Intravenous Every 12 hours 03/24/22 2226 03/27/22 0908   03/25/22 1000  linezolid (ZYVOX) IVPB 600 mg  Status:  Discontinued        600 mg 300 mL/hr over 60 Minutes Intravenous Every 12 hours 03/25/22 0559 03/31/22 0741   03/24/22 2045  ceFEPIme (MAXIPIME) 2 g in sodium chloride 0.9 % 100 mL IVPB  2 g 200 mL/hr over 30 Minutes Intravenous  Once 03/24/22 2036 03/24/22 2200   03/24/22 2045  metroNIDAZOLE (FLAGYL) IVPB 500 mg        500 mg 100 mL/hr over 60 Minutes Intravenous  Once 03/24/22 2036 03/24/22 2329        DVT prophylaxis: SCDs  Code Status: Full code  Family Communication: Discussed with daughter at bedside    CONSULTS urology, palliative care   Objective    Physical Examination:  General-appears in no acute distress Heart-S1-S2, regular, no murmur auscultated Lungs-clear to auscultation bilaterally, no wheezing or crackles auscultated Abdomen-soft, nontender, no organomegaly Extremities-no edema in the lower extremities Neuro-alert, oriented x3, no focal deficit noted   Status is: Inpatient:           [name]   [facility] If 7PM-7AM, please contact night-coverage at [email or web address], Office  [phone]   04/01/2022, 4:00 PM  LOS: 7 days

## 2022-04-01 NOTE — Progress Notes (Signed)
Nutrition Follow-up  DOCUMENTATION CODES:   Severe malnutrition in context of chronic illness  INTERVENTION:  - will adjust TF regimen: Jevity 1.5 @ 60 ml/hr x15 hours/day (1600-0700) to advance by 10 ml every 15 hours of run time to reach goal rate of 90 ml/hr with 1 packet Juven BID and 60 ml Prosource TF20 once/day.  - at goal rate, Jevity 1.5 @ 90 ml/hr x15 hours/day (1600-0700) with 1 packet Juven BID and 60 ml Prosource TF20 once/day will provide 2295 kcal, 111 grams protein, and 1026 ml free water.  - continue order for 150 ml free water every 4 hours (900 ml/day) that was started on 8/2.   NUTRITION DIAGNOSIS:   Severe Malnutrition related to chronic illness (dysphagia) as evidenced by severe fat depletion, severe muscle depletion. -ongoing  GOAL:   Patient will meet greater than or equal to 90% of their needs -met with TF regimen  MONITOR:   Diet advancement, TF tolerance, Labs, Weight trends  ASSESSMENT:   Pt with hx of PAF, CAD, DM type 2, CVA, GERD, COPD, remote history of prostate cancer, dyslipidemia, hypertension, and recurrent UTIs presented to ED with hematuria and vomiting. G-tube in place for hx of dysphagia.  Patient laying in bed resting with daughters and wife at bedside. One of patient's daughters shares that patient was discharged home around 7/2 and returned to the hospital almost exactly a month later.   While at home, he was receiving Jevity 1.5 @ 90 ml/hr x15 hours/day via G-tube. Free water flush was being adjusted at home d/t concern about hyponatremia. He had been on Osmolite previously but this led to diarrhea so he was switched to Jevity 1.5.  He had been doing well on regimen at home until the day PTA when he had projective vomiting episodes.   Daughter shares that patient's wife (who arrived during the course of RD visit) was going to be bringing mashed peaches with sugar for patient. Alerted daughter that patient was NPO. She shares that it was  her understanding that he was only NPO for surgery yesterday.  It appears that patient has been NPO since 7/31.  Daughter shares that it has been difficult for patient to consuming things PO d/t oral secretions.   Weight yesterday was +2 lb compared to weight on 7/31. Non-pitting edema to BLE documented in the edema section of flow sheet x3 yesterday.  Able to talk with RN after visit and let RN know of plan to change TF regimen to be x15 hrs/day.   Yesterday patient underwent cystoscopy with fulguration.    Labs reviewed; CBGs: 141, 157, 167 mg/dl. Medications reviewed; 20 mg pepcid BID, sliding scale novolog.   Diet Order:   Diet Order             Diet NPO time specified  Diet effective midnight                   EDUCATION NEEDS:   Not appropriate for education at this time  Skin:  Skin Assessment: Reviewed RN Assessment  Last BM:  8/7 (type 6 x1, medium amount; type 7 x1, small amount)  Height:   Ht Readings from Last 1 Encounters:  03/31/22 5\' 11"  (1.803 m)    Weight:   Wt Readings from Last 1 Encounters:  03/31/22 80.8 kg     BMI:  Body mass index is 24.84 kg/m.  Estimated Nutritional Needs:  Kcal:  2000-2200 kcal/d Protein:  95-110g/d Fluid:  2-2.2L.d  Jarome Matin, MS, RD, LDN, [place] Registered Dietitian II Inpatient Clinical Nutrition RD pager # and on-call/weekend pager # available in [facility]

## 2022-04-01 NOTE — TOC Progression Note (Signed)
Transition of Care [facility]) - Progression Note    Patient Details  Name: PERICLES CARMICHEAL MRN: [id number] Date of Birth: 01/13/1933  Transition [facility]) CM/SW Contact  [place] Deleo, Juliann Pulse, RN Phone Number: 04/01/2022, 3:10 PM  Clinical Narrative:  Has used [facility] in past-unable to accept back. Left vm w/dtr Teresa-awati call back. Jevity @ goal continuous. [facility] [employer] following.     Expected Discharge Plan:  (TBD)    Expected Discharge Plan and Services Expected Discharge Plan:  (TBD)   Discharge Planning Services: CM Consult   Living arrangements for the past 2 months: [place]                   DME Agency: NA                   Social Determinants of Health (SDOH) Interventions    Readmission Risk Interventions    01/16/2022    2:12 PM  Readmission Risk Prevention Plan  Transportation Screening Complete  Medication Review (RN Care Manager) Referral to Pharmacy  PCP or Specialist appointment within 3-5 days of discharge Complete  [place] or [place] Complete  SW Recovery Care/Counseling Consult Complete  [place] Not Applicable

## 2022-04-02 DIAGNOSIS — D649 Anemia, unspecified: Secondary | ICD-10-CM | POA: Diagnosis not present

## 2022-04-02 DIAGNOSIS — N3001 Acute cystitis with hematuria: Secondary | ICD-10-CM | POA: Diagnosis not present

## 2022-04-02 DIAGNOSIS — N179 Acute kidney failure, unspecified: Secondary | ICD-10-CM | POA: Diagnosis not present

## 2022-04-02 DIAGNOSIS — J9601 Acute respiratory failure with hypoxia: Secondary | ICD-10-CM | POA: Diagnosis not present

## 2022-04-02 LAB — COMPREHENSIVE METABOLIC PANEL
ALT: 28 U/L (ref 0–44)
AST: 29 U/L (ref 15–41)
Albumin: 2.3 g/dL — ABNORMAL LOW (ref 3.5–5.0)
Alkaline Phosphatase: 79 U/L (ref 38–126)
Anion gap: 5 (ref 5–15)
BUN: 35 mg/dL — ABNORMAL HIGH (ref 8–23)
CO2: 27 mmol/L (ref 22–32)
Calcium: 8.8 mg/dL — ABNORMAL LOW (ref 8.9–10.3)
Chloride: 103 mmol/L (ref 98–111)
Creatinine, Ser: 0.71 mg/dL (ref 0.61–1.24)
GFR, Estimated: >60 mL/min (ref >60)
Glucose, Bld: 146 mg/dL — ABNORMAL HIGH (ref 70–99)
Potassium: 4.6 mmol/L (ref 3.5–5.1)
Sodium: 135 mmol/L (ref 135–145)
Total Bilirubin: 0.4 mg/dL (ref 0.3–1.2)
Total Protein: 4.7 g/dL — ABNORMAL LOW (ref 6.5–8.1)

## 2022-04-02 LAB — CBC
HCT: 25.4 % — ABNORMAL LOW (ref 39.0–52.0)
Hemoglobin: 7.7 g/dL — ABNORMAL LOW (ref 13.0–17.0)
MCH: 29.7 pg (ref 26.0–34.0)
MCHC: 30.3 g/dL (ref 30.0–36.0)
MCV: 98.1 fL (ref 80.0–100.0)
Platelets: 82 10*3/uL — ABNORMAL LOW (ref 150–400)
RBC: 2.59 MIL/uL — ABNORMAL LOW (ref 4.22–5.81)
RDW: 19.5 % — ABNORMAL HIGH (ref 11.5–15.5)
WBC: 3.9 10*3/uL — ABNORMAL LOW (ref 4.0–10.5)
nRBC: 0 % (ref 0.0–0.2)

## 2022-04-02 LAB — GLUCOSE, CAPILLARY
Glucose-Capillary: 110 mg/dL — ABNORMAL HIGH (ref 70–99)
Glucose-Capillary: 117 mg/dL — ABNORMAL HIGH (ref 70–99)
Glucose-Capillary: 146 mg/dL — ABNORMAL HIGH (ref 70–99)
Glucose-Capillary: 147 mg/dL — ABNORMAL HIGH (ref 70–99)
Glucose-Capillary: 165 mg/dL — ABNORMAL HIGH (ref 70–99)
Glucose-Capillary: 174 mg/dL — ABNORMAL HIGH (ref 70–99)

## 2022-04-02 LAB — MAGNESIUM: Magnesium: 2 mg/dL (ref 1.7–2.4)

## 2022-04-02 LAB — PHOSPHORUS: Phosphorus: 2.9 mg/dL (ref 2.5–4.6)

## 2022-04-02 MED ORDER — IPRATROPIUM BROMIDE 0.02 % IN SOLN
0.5000 mg | Freq: Three times a day (TID) | RESPIRATORY_TRACT | Status: DC
  Administered 2022-04-03 (×3): 0.5 mg via RESPIRATORY_TRACT
  Filled 2022-04-02 (×3): qty 2.5

## 2022-04-02 MED ORDER — LEVALBUTEROL HCL 0.63 MG/3ML IN NEBU
0.6300 mg | INHALATION_SOLUTION | Freq: Three times a day (TID) | RESPIRATORY_TRACT | Status: DC
  Administered 2022-04-03 (×3): 0.63 mg via RESPIRATORY_TRACT
  Filled 2022-04-02 (×3): qty 3

## 2022-04-02 MED ORDER — IPRATROPIUM BROMIDE 0.02 % IN SOLN
0.5000 mg | Freq: Four times a day (QID) | RESPIRATORY_TRACT | Status: DC
  Administered 2022-04-02 (×2): 0.5 mg via RESPIRATORY_TRACT
  Filled 2022-04-02 (×2): qty 2.5

## 2022-04-02 MED ORDER — GUAIFENESIN 100 MG/5ML PO LIQD
15.0000 mL | ORAL | Status: DC | PRN
  Administered 2022-04-02 – 2022-04-03 (×3): 15 mL
  Filled 2022-04-02 (×3): qty 20

## 2022-04-02 MED ORDER — GUAIFENESIN ER 600 MG PO TB12: 1200.0000 mg | ORAL_TABLET | Freq: Two times a day (BID) | ORAL | Status: DC

## 2022-04-02 MED ORDER — LEVALBUTEROL HCL 0.63 MG/3ML IN NEBU
0.6300 mg | INHALATION_SOLUTION | Freq: Four times a day (QID) | RESPIRATORY_TRACT | Status: DC
Start: 2022-04-02 — End: 2022-04-02
  Administered 2022-04-02 (×2): 0.63 mg via RESPIRATORY_TRACT
  Filled 2022-04-02 (×2): qty 3

## 2022-04-02 NOTE — Progress Notes (Signed)
Patient ID: [name], male   DOB: Jul 19, 1933, 88 y.o.   MRN: [id number]    Progress Note from the Palliative Medicine Team at [facility]   Patient Name: [name]        Date: 04/02/2022 DOB: 11-23-1932  Age: 88 y.o. MRN#: [id number] Attending Physician: Kerney Elbe, DO Primary Care Physician: [name], DO Admit Date: 03/24/2022   Medical records reviewed   88 y.o. male  admitted on 03/24/2022 with past medical history significant  PAF on Eliquis, HFrEF (EF 35% by TTE 04/25/2020), CAD s/p CABG, Mobitz 2 second-degree AV block s/p PPM, COPD, T2DM, HTN, HLD, thrombocytopenia, recurrent UTIs, OSA not tolerating CPAP,  AMS.   Was seen for the first time by PMT for consultation on the past hospitalization on Jan 07, 2022.  During that hospitalization a PEG tube was placed for nutritional support.   Patient had another hospitalization in June 2023 secondary to cellulitis and infection around the PEG site.  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  He was treated and follow by urology/Dr. Jeffie Pollock.     Patient has been cared for in his home since that time.  His daughter [name] is the main caregiver along with wife and other family members.    S/p Cystoscopy yesterday, patient tolerated it well and urine is clear.      This nurse practitioner went to visit patient at bedside for follow-up for palliative medicine needs and emotional support.  Patient's daughter and bedside RN were repositioning patient, he hollered out the whole time.  Once settled, [name] calm down, relaxed and appeared comfortable.  He is lethargic and speech remains garbled.  I stated bedside and met with his daughter [name] and her nurse friend for continued conversation regarding current medical situation.  I expressed my concern for overall comfort and quality of life for [name] at this time.  Daughter is very happy with results of cystoscopy, she remains hopeful  for improvement now that hematuria seems to have been.   Ongoing education regarding the likely trajectory and patient's high risk for decompensation secondary to bedbound status, dysphagia, cognitive changes and high risk for infection.  Patient does not have medical decision-making capacity.   Patient's wife is documented his main decision maker and his advanced directives.  Family plan for patient to return home, daughter at bedside is not interested in hospice care at this time.  Family is requesting home health services and physical therapy in the home.  PMT will continue to support holistically.  Questions and concerns addressed   Discussed with Dr Darrick Meigs and bedside RN   Wadie Lessen NP  Palliative Medicine Team Team Phone # [phone] Pager [phone]

## 2022-04-02 NOTE — Progress Notes (Signed)
2 Days Post-Op  Subjective: [name] is voiding well with a PVR of 37m and continued clear urine.   .   ROS:  Review of Systems  Unable to perform ROS: Mental acuity    Anti-infectives: Anti-infectives (From admission, onward)    Start     Dose/Rate Route Frequency Ordered Stop   03/31/22 1000  linezolid (ZYVOX) tablet 600 mg        600 mg Per Tube Every 12 hours 03/31/22 0741 04/04/22 0959   03/30/22 1000  fluconazole (DIFLUCAN) tablet 100 mg       See Hyperspace for full Linked Orders Report.   100 mg Per Tube Daily 03/29/22 1517 04/12/22 0959   03/29/22 1615  fluconazole (DIFLUCAN) tablet 200 mg       See Hyperspace for full Linked Orders Report.   200 mg Per Tube  Once 03/29/22 1517 03/29/22 1705   03/25/22 1000  ceFEPIme (MAXIPIME) 2 g in sodium chloride 0.9 % 100 mL IVPB  Status:  Discontinued        2 g 200 mL/hr over 30 Minutes Intravenous Every 12 hours 03/24/22 2226 03/27/22 0908   03/25/22 1000  linezolid (ZYVOX) IVPB 600 mg  Status:  Discontinued        600 mg 300 mL/hr over 60 Minutes Intravenous Every 12 hours 03/25/22 0559 03/31/22 0741   03/24/22 2045  ceFEPIme (MAXIPIME) 2 g in sodium chloride 0.9 % 100 mL IVPB        2 g 200 mL/hr over 30 Minutes Intravenous  Once 03/24/22 2036 03/24/22 2200   03/24/22 2045  metroNIDAZOLE (FLAGYL) IVPB 500 mg        500 mg 100 mL/hr over 60 Minutes Intravenous  Once 03/24/22 2036 03/24/22 2329       Current Facility-Administered Medications  Medication Dose Route Frequency Provider Last Rate Last Admin   acetaminophen (TYLENOL) tablet 650 mg  650 mg Oral Q6H PRN Crosley, Debby, MD   650 mg at 03/30/22 0006   Or   acetaminophen (TYLENOL) suppository 650 mg  650 mg Rectal Q6H PRN Crosley, Debby, MD       amiodarone (PACERONE) tablet 200 mg  200 mg Per Tube Daily Crosley, Debby, MD   200 mg at 04/01/22 1140   Chlorhexidine Gluconate Cloth 2 % PADS 6 each  6 each Topical Daily [place] J, DO   6 each at 03/31/22 1406    doxazosin (CARDURA) tablet 1 mg  1 mg Per Tube Daily Crosley, Debby, MD   1 mg at 04/01/22 1139   famotidine (PEPCID) tablet 20 mg  20 mg Per Tube BID Crosley, Debby, MD   20 mg at 04/01/22 2228   feeding supplement (JEVITY 1.5 CAL/FIBER) liquid 1,000 mL  1,000 mL Per Tube Continuous LOswald Hillock MD 90 mL/hr at 04/01/22 1845 1,000 mL at 04/01/22 1845   feeding supplement (PROSource TF20) liquid 60 mL  60 mL Per Tube Daily LOswald Hillock MD   60 mL at 04/01/22 1843   ferrous gluconate (FERGON) tablet 324 mg  324 mg Oral QPM Crosley, Debby, MD   324 mg at 04/01/22 1843   fluconazole (DIFLUCAN) tablet 100 mg  100 mg Per Tube Daily [place] EDonnamarie Poag DO   100 mg at 04/01/22 1139   free water 150 mL  150 mL Per Tube Q4H [place] Eric J, DO   150 mL at 04/02/22 0351   insulin aspart (novoLOG) injection 0-5 Units  0-5 Units Subcutaneous  QHS Quintella Baton, MD       insulin aspart (novoLOG) injection 0-9 Units  0-9 Units Subcutaneous Q4H Quintella Baton, MD   2 Units at 04/02/22 0350   linezolid (ZYVOX) tablet 600 mg  600 mg Per Tube Q12H Leodis Sias T, RPH   600 mg at 04/01/22 2227   mometasone-formoterol (DULERA) 200-5 MCG/ACT inhaler 2 puff  2 puff Inhalation BID Quintella Baton, MD   2 puff at 04/01/22 2012   nutrition supplement (JUVEN) (JUVEN) powder packet 1 packet  1 packet Per Tube BID BM Oswald Hillock, MD   1 packet at 04/01/22 2241   ondansetron (ZOFRAN) tablet 4 mg  4 mg Oral Q6H PRN Quintella Baton, MD       Or   ondansetron (ZOFRAN) injection 4 mg  4 mg Intravenous Q6H PRN Crosley, Debby, MD       oxyCODONE (ROXICODONE) 5 MG/5ML solution 5 mg  5 mg Per Tube Q6H PRN [place] Eric J, DO       senna-docusate (Senokot-S) tablet 1 tablet  1 tablet Oral QHS PRN Quintella Baton, MD       venlafaxine [facility]) tablet 37.5 mg  37.5 mg Per Tube QHS [place] Donnamarie Poag, DO   37.5 mg at 04/01/22 2227   Zinc Oxide (TRIPLE PASTE) 12.8 % ointment   Topical BID Quintella Baton, MD   Given at 04/01/22 2230      Objective: Vital signs in last 24 hours: Temp:  [98.2 F (36.8 C)-98.5 F (36.9 C)] 98.2 F (36.8 C) (08/09 0431) Pulse Rate:  [73-77] 76 (08/09 0431) Resp:  [16-18] 16 (08/09 0431) BP: (113-118)/(47-53) 118/53 (08/09 0431) SpO2:  [98 %-100 %] 98 % (08/09 0431)  Intake/Output from previous day: 08/08 0701 - 08/09 0700 In: 120  Out: 2400 [Urine:2400] Intake/Output this shift: Total I/O In: 0  Out: 1600 [Urine:1600]   Physical Exam Vitals reviewed.  Constitutional:      Appearance: Normal appearance.     Lab Results:  Recent Labs    04/01/22 0411 04/02/22 0453  WBC 3.8* 3.9*  HGB 7.7* 7.7*  HCT 25.6* 25.4*  PLT 81* 82*    BMET Recent Labs    03/31/22 0403 04/01/22 0411  NA 137 138  K 4.6 5.0  CL 107 105  CO2 26 27  GLUCOSE 122* 140*  BUN 23 23  CREATININE 0.52* 0.68  CALCIUM 9.0 8.9    PT/INR No results for input(s): "LABPROT", "INR" in the last 72 hours. ABG No results for input(s): "PHART", "HCO3" in the last 72 hours.  Invalid input(s): "PCO2", "PO2"  Studies/Results: No results found. CT AP 03/24/22. IMPRESSION: 1. Negative for acute pulmonary embolism. 2. Bronchial wall thickening, mucous plugging and scattered nodular opacities greatest in the dependent lungs. Findings are compatible with bronchitis/bronchiolitis with a distribution suspicious for aspiration. 3. Irregular bladder wall thickening. This may be due to bladder decompression however cystitis is not excluded. Recommend correlation with urinalysis. 4. Aortic and coronary artery atherosclerotic calcification. 5. 2.1 cm left common iliac artery aneurysm. 6. Healing fractures of the right superior and inferior pubic rami right sacral ala posterior right iliac bone.     Electronically Signed   By: Placido Sou M.D.   On: 03/24/2022 23:07    Assessment and Plan: Gross hematuria with VRE:   The hematuria has resolved.  He has had recurrent VRE even when he didn't  have a foley.  It would be worthwhile to give him an extended course  of therapy and consider a daily or every  other day dose of Linezolid for a month to prevent recurrence.   Urinary retention.  He is voiding well after foley removal.         LOS: 8 days    Irine Seal 04/02/2022 [phone] Patient ID: Clifton Custard, male   DOB: 20-Oct-1932, 88 y.o.   MRN: [id number] Patient ID: BENCE TRAPP, male   DOB: [birth year], 88 y.o.   MRN: [id number] Patient ID: CREED KAIL, male   DOB: 04/21/1933, 88 y.o.   MRN: [id number]

## 2022-04-02 NOTE — Progress Notes (Signed)
Speech Language Pathology Treatment: Dysphagia  Patient Details Name: [name] MRN: [id number] DOB: [birth year] Today's Date: 04/02/2022 Time: 1212-1235 SLP Time Calculation (min) (ACUTE ONLY): 23 min  Assessment / Plan / Recommendation Clinical Impression  Pt seen today to determine readiness for MBS - Pt greeted with mouth agape and rather sleepy.  Daughter, [name], is a Marine scientist and reports pt was eating well at times PTA.   She states he has "good and bad days".  Today pt with congested breathing and viscous secretion on posterior soft palate without awareness. Daughter provided oral care using suction but pt moaned during oral suction and pulled away from daughter.   Cues to cough and expectorate were not effective to clear wet breathing quality.  SLP provided administration of tip of straw bolus of thin water and daughter provided him with peach juice - pt allowed anterior oral spillage with boluses.  When daughter advised pt to swallow - he state he could not and would not.  Pt breathing and voice did clear with pt reflexively DELAYED swallowing of secretions.  At this time, he is NOT ready for MBS- as he is not eliciting swallow nor following directions and unable to clear secretions consistently.     Advised daughter, [name], that even if MBS is completed, doubtful that it will change pt's outcomes/goals.  When inquired re: goals for po - nutrition vs comfort - her ultimate goal is for pt to maintain nutrition via po alone - which in this SLPs opinion if very unlikely.    Pt remains full code, bed bound and grossly weak - at this time, doubtful pt's swallow will become to a functional level consistently - Advised daughter to concerns.  She wishes to feed pt peaches and states "You guys say no".  Reviewed aspiration risk and current level of dysphagia.  Will follow up tomorrow for MBS readiness per daughter's wishes.  Advised RN to findings/recommendations.     HPI HPI: Pt is an 88 y.o.  male who presented with projectile emesis. In the ED, pt noted to be sleepy/somnolent with weak wet cough, and poorly interactive. CT chest 7/31: Bronchial wall thickening, mucous plugging and scattered nodular  opacities greatest in the dependent lungs. Findings thought to be  compatible with bronchitis/bronchiolitis with a distribution suspicious for  aspiration. Pt was in good health until February 2023 when he sustained a pelvic fracture and has been chair bound since. PMH: intolerance of CPAP, PAF, CAD, remote history of prostate cancer, dyslipidemia, hypertension.  G-tube placed May, 2023, d/t poor p.o. intake. Pt had cellulitis and infection around the G-tube site June, 2023. BSE 01/28/22: mild oral dysphagia and significant encouragement needed for p.o. intake; dysphagia 2/thin recommended without need for follow up.      SLP Plan  Continue with current plan of care      Recommendations for follow up therapy are one component of a multi-disciplinary discharge planning process, led by the attending physician.  Recommendations may be updated based on patient status, additional functional criteria and insurance authorization.    Recommendations  Diet recommendations: NPO (oral moisture) Medication Administration: Via alternative means                Oral Care Recommendations: Oral care QID Follow Up Recommendations: Follow physician's recommendations for discharge plan and follow up therapies SLP Visit Diagnosis: Dysphagia, unspecified (R13.10);Dysphagia, oropharyngeal phase (R13.12) Plan: Continue with current plan of care          Tammy K, MS  [facility] SLP Acute Rehab Services Office [phone] Pager [phone]  Macario Golds  04/02/2022, 12:36 PM

## 2022-04-02 NOTE — Evaluation (Signed)
Occupational Therapy Evaluation Patient Details Name: [name] MRN: [id number] DOB: August 10, 1933 Today's Date: 04/02/2022   History of Present Illness 88 y.o. male with medical history significant of intolerance of CPAP, PAF, CAD, remote history of prostate cancer, dyslipidemia, hypertension.  He was in good health until February 2023.  Unfortunately in February 2023 he sustained a pelvic fracture and has been chair bound since.  12/2021 he had a PEG placed secondary to poor p.o. intake.  01/24/2022 he had cellulitis and infection around the PEG site.  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 Patient admitted for Acute hypoxemic respiratory failure in the setting of likely aspiration pneumonia   Clinical Impression   [name] is an 88 year old man who presents with above medical history. On evaluation she required max-total assist for bed transfers, physical assistance to sit upright at edge of bed and near total assistance for all ADLs. Prior to admission patient living at home with 24/7 assistance from family and home health services. Family had been able to transfer him to wheelchair daily and therapy working on standing, transfers and improving functional abilities. Patient will benefit from skilled OT services while in hospital to improve deficits and learn compensatory strategies as needed in order to return to PLOF.  Daughter would like as much therapy as possible to help patient get back to being able to transfer. She reports they had a hoyer but returned it because it was not easily usable in home environment.      Recommendations for follow up therapy are one component of a multi-disciplinary discharge planning process, led by the attending physician.  Recommendations may be updated based on patient status, additional functional criteria and insurance authorization.   Follow Up Recommendations  Home health OT    Assistance Recommended at Discharge  Frequent or constant Supervision/Assistance  Patient can return home with the following Two people to help with walking and/or transfers;Two people to help with bathing/dressing/bathroom;Assistance with cooking/housework;Help with stairs or ramp for entrance    Functional Status Assessment  Patient has had a recent decline in their functional status and demonstrates the ability to make significant improvements in function in a reasonable and predictable amount of time.  Equipment Recommendations  None recommended by OT    Recommendations for Other Services       Precautions / Restrictions Precautions Precautions: Fall Precaution Comments: Peg tube, Restrictions Weight Bearing Restrictions: No      Mobility Bed Mobility Overal bed mobility: Needs Assistance Bed Mobility: Supine to Sit, Sit to Supine, Rolling Rolling: Max assist, +2 for physical assistance   Supine to sit: Max assist, +2 for physical assistance, HOB elevated Sit to supine: Total assist, +2 for physical assistance   General bed mobility comments: Tramsferred to edge of bed. Needed physical assistance to maintain balance. Approx 6 minutes at edge of bed with physical assistance. Could briefly take hands off of hom. Assistance to lift head.    Transfers                   General transfer comment: deferred      Balance Overall balance assessment: Needs assistance Sitting-balance support: No upper extremity supported, Feet supported Sitting balance-Leahy Scale: Poor Sitting balance - Comments: physical assistance to maitnain balance                                   ADL either  performed or assessed with clinical judgement   ADL Overall ADL's : Needs assistance/impaired Eating/Feeding: NPO   Grooming: Maximal assistance   Upper Body Bathing: Maximal assistance;Bed level   Lower Body Bathing: Total assistance;Bed level   Upper Body Dressing : Total assistance   Lower Body  Dressing: Total assistance;Bed level   Toilet Transfer: Total assistance;+2 for physical assistance   Toileting- Clothing Manipulation and Hygiene: Total assistance;Sit to/from stand               Vision Patient Visual Report: No change from baseline       Perception     Praxis      Pertinent Vitals/Pain Pain Assessment Pain Assessment: PAINAD Breathing: normal Negative Vocalization: repeated troubled calling out, loud moaning/groaning, crying Facial Expression: smiling or inexpressive Body Language: tense, distressed pacing, fidgeting Consolability: distracted or reassured by voice/touch PAINAD Score: 4 Pain Location: unsure - pain with rolling movements Pain Descriptors / Indicators: Grimacing Pain Intervention(s): Repositioned     Hand Dominance Right   Extremity/Trunk Assessment Upper Extremity Assessment Upper Extremity Assessment: RUE deficits/detail;LUE deficits/detail RUE Deficits / Details: active assist to raise shoulder otherwise grossly functional ROM of elbow, wrist and fingers LUE Deficits / Details: active assist to raise shoulder otherwise grossly functional ROM of elbow, wrist and fingers   Lower Extremity Assessment Lower Extremity Assessment: Defer to PT evaluation   Cervical / Trunk Assessment Cervical / Trunk Assessment: Kyphotic   Communication Communication Communication: HOH   Cognition Arousal/Alertness: Awake/alert Behavior During Therapy: WFL for tasks assessed/performed Overall Cognitive Status: History of cognitive impairments - at baseline                                 General Comments: Has had cognitive deficits recently.     General Comments       Exercises     Shoulder Instructions      Home Living Family/patient expects to be discharged to:: Private residence Living Arrangements: Children;Other relatives (grandchildren) Available Help at Discharge: Family;Available 24 hours/day Type of Home:  House Home Access: Stairs to enter [employer] of Steps: 1 through garage and 1 at [employer]: Two level;Laundry or work area in Building surveyor of Steps: 15 stairs Alternate Level Stairs-Rails: Can reach both Bathroom Shower/Tub: Occupational psychologist: Standard     Home Equipment: Conservation officer, nature (2 wheels);Shower seat;BSC/3in1;Cane - single point;Cane - quad;Rollator (4 wheels);Grab bars - tub/shower;Hand held shower head;Hospital bed;Wheelchair - manual   Additional Comments: Has about 12 walking sticks.      Prior Functioning/Environment Prior Level of Function : Needs assist             Mobility Comments: In april patient was walking with cane. Currently mostly bed bound - family transfers him to wheelchair daily ADLs Comments: 2 months ago he was independent - now he is near total assist        OT Problem List: Decreased strength;Decreased range of motion;Decreased activity tolerance;Impaired balance (sitting and/or standing);Decreased safety awareness;Decreased cognition;Decreased knowledge of use of DME or AE;Obesity      OT Treatment/Interventions: Self-care/ADL training;Therapeutic exercise;Neuromuscular education;DME and/or AE instruction;Therapeutic activities;Balance training;Cognitive remediation/compensation;Patient/family education    OT Goals(Current goals can be found in the care plan section) Acute Rehab OT Goals Patient Stated Goal: improve functional abilities OT Goal Formulation: With family Time For Goal Achievement: 04/16/22 Potential to Achieve Goals: [place]  OT  Frequency: Min 2X/week    Co-evaluation              AM-PAC OT "6 Clicks" Daily Activity     Outcome Measure Help from another person eating meals?: Total Help from another person taking care of personal grooming?: A Lot Help from another person toileting, which includes using toliet, bedpan, or urinal?: Total Help from another  person bathing (including washing, rinsing, drying)?: A Lot Help from another person to put on and taking off regular upper body clothing?: Total Help from another person to put on and taking off regular lower body clothing?: Total 6 Click Score: 8   End of Session Nurse Communication: Mobility status  Activity Tolerance: Patient tolerated treatment well Patient left: with call bell/phone within reach;in bed;with family/visitor present  OT Visit Diagnosis: Muscle weakness (generalized) (M62.81)                Time: 7014-1030 OT Time Calculation (min): 28 min Charges:  OT General Charges $OT Visit: 1 Visit OT Evaluation $OT Eval Low Complexity: 1 Low OT Treatments $Therapeutic Activity: 8-22 mins  Gaile Allmon, OTR/L [place] [phone] Pager: [place] 04/02/2022, 4:57 PM

## 2022-04-02 NOTE — Progress Notes (Signed)
PROGRESS NOTE    [name]  ZOX:[id number] DOB: Oct 26, 1932 DOA: 03/24/2022 PCP: [name], DO   Brief Narrative:  88 y.o. male with medical history significant of intolerance of CPAP, PAF, CAD, remote history of prostate cancer, dyslipidemia, hypertension.  He was in good health until February 2023. Unfortunately in February 2023 he sustained a pelvic fracture and has been chair bound since.  12/2021 he had a PEG placed secondary to poor p.o. intake.  01/24/2022 he had cellulitis and infection around the PEG site. 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  This was initially treated with 7 days of p.o. linezolid, hematuria resolved only to return shortly after completion of linezolid.  Urology ordered with easily to be completed for 21 days.  Hematuria resolved, patient unfortunate developed urinary retention, Foley replaced.  Two days later hematuria returned and has persisted.    States the patient has a G-tube, nutrition is Jevity at 90 cc per an hour.  He also was given 140 cc of free water every 4 hours.  Yesterday he had an episode of projectile nausea and vomiting.  His tube feeds was held for 15 hours.  The next day to be resumed around lunch, within 1 hour he again had a projectile emesis.  His family is concerned for bowel obstruction.  He was seen in the ER.  There is no reports of fever or chills  In the ER patient current hemoglobin was 6.  He has been transfused 1 unit packed red blood cell.  Patient still with hematuria.  **Interim History Patient's hematuria was evaluated by urology who took the patient for cystoscopy with fulguration given that he had recurrent hemorrhagic radiation cystitis associated VRE.  His bleeding was able to be stopped and he was continued on linezolid.  Patient sound junky today so we have initiated Xopenex and Atrovent.  Palliative care was consulted for goals of care discussion.  Currently family is not interested in  hospice care at this time and patient remains at high risk for further decompensation given his bedbound status, dysphagia and cognitive changes.  SLP evaluated and planning for MBS likely tomorrow and recommending n.p.o. still. Will repeat CXR in the AM.    Assessment and Plan:  Acute hypoxemic respiratory failure in the setting of likely aspiration pneumonia -Aspiration pneumonia is likely the culprit given his projectile vomiting -Patient was hypoxemic in the ED with SpO2 87% -SpO2: 94 % O2 Flow Rate (L/min): 2 L/min -Xopenex and Atrovent and will continue flutter valve and incentive spirometry -Will also add guaifenesin via his PEG -CT chest findings were consistent with aspiration pneumonia; CT Chest PE protocol showed "Negative for acute pulmonary embolism.  Bronchial wall thickening, mucous plugging and scattered nodular opacities greatest in the dependent lungs. Findings are compatible with bronchitis/bronchiolitis with a distribution suspicious for aspiration. Irregular bladder wall thickening. This may be due to bladder decompression however cystitis is not excluded. Recommend correlation with urinalysis. Aortic and coronary artery atherosclerotic calcification.  2.1 cm left common iliac artery aneurysm. Healing fractures of the right superior and inferior pubic rami right sacral ala posterior right iliac bone." -Patient started on linezolid, the initial plan was for 5-day course however he has been treated with linezolid since 03/25/2022 and will likely need an extended course given his VRE UTI -Continue with guaifenesin 15 mL per tube every 4 as needed for cough -Continue with Dulera 2 puffs IH twice daily -WBC is relatively stable from yesterday is now gone  from 3.8 is now 3.9 -Continue aspiration precautions and pulm toileting -Patient Is not safe to swallow and already has a PEG tube -Repeat chest x-ray in the morning -C/w Palliative GOC Discussions    Hematuria Acute blood loss  anemia -Secondary to acute on chronic cystitis as well as  VRE UTI -Recently treated outpatient for VRE with linezolid -On admission hemoglobin was 6.0; was transfused 3 unit PRBCs since admission -Patient's hemoglobin/marker is now 7.7/25.4 -Patient was seen by urology; initially urology felt no need for further intervention or CBI at this time, treat conservatively blood transfusion and recommend Foley catheter exchanges every 3 weeks -Underwent cystoscopy yesterday found to have small hemorrhagic lesion which was fulgurated.   -Hematuria resolved. -Urology following -Continue linezolid as above and will likely need a prolonged course and will discuss with ID about treatment duration and length -Continue with doxazosin 1 mg per tube daily  Nausea and Vomiting  -CT Abd Pelvis with Contrast done and showed ": Adrenals are unremarkable. No urinary calculi or hydronephrosis. Multiple large bilateral renal cysts measuring up to 7.1 cm in the right upper pole. A few cysts are too small to adequately characterize. Cortical scarring left kidney. Foley catheter in the decompressed bladder. Gas within the bladder lumen is presumed related to Foley catheter. Irregular bladder wall thickening.   Stomach/Bowel: Gastrostomy tube. Decreased stranding and subcutaneous gas at the site of the gastrostomy since 01/24/2022. Normal caliber large and small bowel. Normal appendix. No bowel wall thickening or adjacent inflammatory change." -Continue with supportive care and antiemetics    Chronic Thrombocytopenia -Platelet Count has gone from 88 -> 81 -> 82 -Continue to monitor for further signs and symptoms of bleeding; it seems that his hematuria is resolved -Repeat CBC in a.m.   Sacral/heel decubitus ulcer -Local wound care with zinc oxide topically as well -Will need wound care to further evaluate   Hypophosphatemia -Replete as necessary and has resolved as phosphorus level is now 2.9 -To monitor  and trend and repeat phosphorus level in the morning   Diabetes Mellitus Type 2 -Patient on metformin 500 mg daily as outpatient -A1c was 5.2 on 01/06/2022 -Metformin on hold -Continue sliding scale insulin NovoLog -Well-controlled   Depression -Continue venlafaxine 37.5 mg nightly   Adult failure to thrive -Patient has PEG tube in place -Progressive decline since February 2023 -Overall poor prognosis -Dr. [place] discussed with patient's PCP Dr. Lajuana Ripple who recommended and agreed with palliative care consultation. -Continue with Prosource tube feeding 60 mL per tube daily along with Jevity 1.5 and 90 mL/h over 15 hours as well as 150 mL of free water flushes every 4 hours -Palliative care has been consulted for goals of care and medical decision making and at this time the patient's family is not interested in hospice; his prognosis is extremely poor and he is at high risk for further decompensation  GERD/GI prophylaxis -Continue with Famotidine 20 mg per tube twice daily    DVT prophylaxis: SCDs Start: 03/25/22 0518 SCDs Start: 03/25/22 0516    Code Status: Full Code Family Communication: Discussed with daughter who is a nurse at bedside  Disposition Plan:  Level of care: Telemetry Status is: Inpatient Remains inpatient appropriate because: Hematuria is improving but continues to have significant congestion on examination   Consultants:  Urology Palliative care medicine  Procedures:  Cystoscopy with fulguration by Dr. Irine Seal  Antimicrobials:  Anti-infectives (From admission, onward)    Start     Dose/Rate Route Frequency Ordered Stop  03/31/22 1000  linezolid (ZYVOX) tablet 600 mg        600 mg Per Tube Every 12 hours 03/31/22 0741 04/04/22 0959   03/30/22 1000  fluconazole (DIFLUCAN) tablet 100 mg       See Hyperspace for full Linked Orders Report.   100 mg Per Tube Daily 03/29/22 1517 04/12/22 0959   03/29/22 1615  fluconazole (DIFLUCAN) tablet 200 mg        See Hyperspace for full Linked Orders Report.   200 mg Per Tube  Once 03/29/22 1517 03/29/22 1705   03/25/22 1000  ceFEPIme (MAXIPIME) 2 g in sodium chloride 0.9 % 100 mL IVPB  Status:  Discontinued        2 g 200 mL/hr over 30 Minutes Intravenous Every 12 hours 03/24/22 2226 03/27/22 0908   03/25/22 1000  linezolid (ZYVOX) IVPB 600 mg  Status:  Discontinued        600 mg 300 mL/hr over 60 Minutes Intravenous Every 12 hours 03/25/22 0559 03/31/22 0741   03/24/22 2045  ceFEPIme (MAXIPIME) 2 g in sodium chloride 0.9 % 100 mL IVPB        2 g 200 mL/hr over 30 Minutes Intravenous  Once 03/24/22 2036 03/24/22 2200   03/24/22 2045  metroNIDAZOLE (FLAGYL) IVPB 500 mg        500 mg 100 mL/hr over 60 Minutes Intravenous  Once 03/24/22 2036 03/24/22 2329       Subjective: Seen and examined at bedside and he is hard of hearing and he was not alert enough to participate in examination given his current condition.  He sounded like he he had some congestion.  Daughter at bedside answered most of the questions and is happy that his hematuria has resolved.  Patient has not been out of bed yet and awaiting for physical therapy to see him.  Palliative care continue to be involved in goals of care discussion.  Objective: Vitals:   04/01/22 2012 04/02/22 0431 04/02/22 0500 04/02/22 1227  BP:  (!) 118/53  (!) 152/71  Pulse:  76  77  Resp:  16  18  Temp:  98.2 F (36.8 C)  98.1 F (36.7 C)  TempSrc:    Oral  SpO2: 98% 98%  94%  Weight:   80.7 kg   Height:        Intake/Output Summary (Last 24 hours) at 04/02/2022 1556 Last data filed at 04/02/2022 1300 Gross per 24 hour  Intake 4170 ml  Output 2400 ml  Net 1770 ml   Filed Weights   03/24/22 2312 03/31/22 0107 04/02/22 0500  Weight: 80 kg 80.8 kg 80.7 kg   Examination: Physical Exam:  Constitutional: Elderly chronically ill-appearing Caucasian male currently was not alert enough to participate in examination Respiratory: Diminished to  auscultation bilaterally with coarse breath sounds and some rhonchi and some slight wheezing., no rales or crackles appreciated.  Slightly increased respiratory rate and is wearing supplemental oxygen via nasal cannula.  Cardiovascular: RRR, no murmurs / rubs / gallops. S1 and S2 auscultated.  Trace extremity edema Abdomen: Soft, non-tender, distended secondary body habitus and has a PEG tube in place.. Bowel sounds positive.  GU: Deferred. Musculoskeletal: No clubbing / cyanosis of digits/nails. No joint deformity upper and lower extremities.  Neurologic: Not awake enough or alert enough to participate in neurological examination and is extremely hard of hearing Psychiatric: Patient has impaired judgment and insight.  He appears somnolent and drowsy  Data Reviewed: I have personally reviewed  following labs and imaging studies  CBC: Recent Labs  Lab 03/29/22 0102 03/30/22 0113 03/31/22 0403 04/01/22 0411 04/02/22 0453  WBC 5.1 3.8* 4.8 3.8* 3.9*  HGB 8.3* 7.2* 8.4* 7.7* 7.7*  HCT 27.4* 24.4* 28.0* 25.6* 25.4*  MCV 97.2 98.4 99.3 99.2 98.1  PLT 84* 85* 88* 81* 82*   Basic Metabolic Panel: Recent Labs  Lab 03/27/22 0126 03/28/22 0116 03/29/22 0102 03/30/22 0113 03/31/22 0403 04/01/22 0411 04/02/22 0453  NA 139 139 134* 132* 137 138 135  K 4.0 3.9 4.8 4.2 4.6 5.0 4.6  CL 111 109 106 102 107 105 103  CO2 '24 24 22 24 26 27 27  '$ GLUCOSE 180* 245* 181* 200* 122* 140* 146*  BUN 29* 32* 31* 28* 23 23 35*  CREATININE 0.77 0.68 0.67 0.64 0.52* 0.68 0.71  CALCIUM 8.7* 8.8* 8.6* 8.7* 9.0 8.9 8.8*  MG 1.9 1.9 1.9  --   --   --  2.0  PHOS 2.2* 2.2* 2.8  --   --   --  2.9   GFR: Estimated Creatinine Clearance: 69.3 mL/min (by C-G formula based on SCr of 0.71 mg/dL). Liver Function Tests: Recent Labs  Lab 04/01/22 0411 04/02/22 0453  AST 44* 29  ALT 26 28  ALKPHOS 75 79  BILITOT 0.6 0.4  PROT 4.9* 4.7*  ALBUMIN 2.2* 2.3*   No results for input(s): "LIPASE", "AMYLASE" in the  last 168 hours. No results for input(s): "AMMONIA" in the last 168 hours. Coagulation Profile: No results for input(s): "INR", "PROTIME" in the last 168 hours. Cardiac Enzymes: No results for input(s): "CKTOTAL", "CKMB", "CKMBINDEX", "TROPONINI" in the last 168 hours. BNP (last 3 results) No results for input(s): "PROBNP" in the last 8760 hours. HbA1C: No results for input(s): "HGBA1C" in the last 72 hours. CBG: Recent Labs  Lab 04/01/22 1947 04/01/22 2340 04/02/22 0348 04/02/22 0736 04/02/22 1224  GLUCAP 150* 163* 165* 117* 146*   Lipid Profile: No results for input(s): "CHOL", "HDL", "LDLCALC", "TRIG", "CHOLHDL", "LDLDIRECT" in the last 72 hours. Thyroid Function Tests: No results for input(s): "TSH", "T4TOTAL", "FREET4", "T3FREE", "THYROIDAB" in the last 72 hours. Anemia Panel: No results for input(s): "VITAMINB12", "FOLATE", "FERRITIN", "TIBC", "IRON", "RETICCTPCT" in the last 72 hours. Sepsis Labs: No results for input(s): "PROCALCITON", "LATICACIDVEN" in the last 168 hours.  Recent Results (from the past 240 hour(s))  Urine Culture     Status: Abnormal   Collection Time: 03/24/22  6:19 PM   Specimen: In/Out Cath Urine  Result Value Ref Range Status   Specimen Description IN/OUT CATH URINE  Final   Special Requests   Final    NONE Performed at [facility], [address], [place], [place] [address]    Culture (A)  Final    1,000 COLONIES/mL ENTEROCOCCUS FAECIUM VANCOMYCIN RESISTANT ENTEROCOCCUS    Report Status 03/26/2022 FINAL  Final   Organism ID, Bacteria ENTEROCOCCUS FAECIUM (A)  Final      Susceptibility   Enterococcus faecium - MIC*    AMPICILLIN >=32 RESISTANT Resistant     NITROFURANTOIN 32 SENSITIVE Sensitive     VANCOMYCIN >=32 RESISTANT Resistant     LINEZOLID 2 SENSITIVE Sensitive     * 1,000 COLONIES/mL ENTEROCOCCUS FAECIUM  Blood Culture (routine x 2)     Status: None   Collection Time: 03/24/22  7:05 PM   Specimen: BLOOD  Result Value  Ref Range Status   Specimen Description BLOOD LEFT ANTECUBITAL  Final   Special Requests  Final    BOTTLES DRAWN AEROBIC AND ANAEROBIC Blood Culture results may not be optimal due to an inadequate volume of blood received in culture bottles   Culture   Final    NO GROWTH 5 DAYS Performed at [facility], [place] [address], [place], [place] [address]    Report Status 03/29/2022 FINAL  Final  Blood Culture (routine x 2)     Status: None   Collection Time: 03/24/22  7:14 PM   Specimen: BLOOD  Result Value Ref Range Status   Specimen Description BLOOD RIGHT ANTECUBITAL  Final   Special Requests   Final    BOTTLES DRAWN AEROBIC AND ANAEROBIC Blood Culture adequate volume   Culture   Final    NO GROWTH 5 DAYS Performed at [facility], [place] [address], [place], [place] [address]    Report Status 03/29/2022 FINAL  Final     Radiology Studies: No results found.   Scheduled Meds:  amiodarone  200 mg Per Tube Daily   Chlorhexidine Gluconate Cloth  6 each Topical Daily   doxazosin  1 mg Per Tube Daily   famotidine  20 mg Per Tube BID   feeding supplement (PROSource TF20)  60 mL Per Tube Daily   ferrous gluconate  324 mg Oral QPM   fluconazole  100 mg Per Tube Daily   free water  150 mL Per Tube Q4H   insulin aspart  0-5 Units Subcutaneous QHS   insulin aspart  0-9 Units Subcutaneous Q4H   ipratropium  0.5 mg Nebulization Q6H   levalbuterol  0.63 mg Nebulization Q6H   linezolid  600 mg Per Tube Q12H   mometasone-formoterol  2 puff Inhalation BID   nutrition supplement (JUVEN)  1 packet Per Tube BID BM   venlafaxine  37.5 mg Per Tube QHS   Zinc Oxide   Topical BID   Continuous Infusions:  feeding supplement (JEVITY 1.5 CAL/FIBER) 1,000 mL (04/01/22 1845)    LOS: 8 days   Raiford Noble, DO [facility] Available via Epic secure chat 7am-7pm After these hours, please refer to coverage provider listed on [email or web address] 04/02/2022, 3:56 PM

## 2022-04-03 ENCOUNTER — Inpatient Hospital Stay (HOSPITAL_COMMUNITY): Payer: Medicare Other

## 2022-04-03 DIAGNOSIS — R1319 Other dysphagia: Secondary | ICD-10-CM

## 2022-04-03 DIAGNOSIS — J9601 Acute respiratory failure with hypoxia: Secondary | ICD-10-CM | POA: Diagnosis not present

## 2022-04-03 DIAGNOSIS — N3001 Acute cystitis with hematuria: Secondary | ICD-10-CM | POA: Diagnosis not present

## 2022-04-03 DIAGNOSIS — N179 Acute kidney failure, unspecified: Secondary | ICD-10-CM | POA: Diagnosis not present

## 2022-04-03 DIAGNOSIS — J690 Pneumonitis due to inhalation of food and vomit: Secondary | ICD-10-CM | POA: Diagnosis not present

## 2022-04-03 DIAGNOSIS — D649 Anemia, unspecified: Secondary | ICD-10-CM | POA: Diagnosis not present

## 2022-04-03 DIAGNOSIS — R319 Hematuria, unspecified: Secondary | ICD-10-CM | POA: Diagnosis not present

## 2022-04-03 LAB — CBC WITH DIFFERENTIAL/PLATELET
Abs Immature Granulocytes: 0.01 10*3/uL (ref 0.00–0.07)
Basophils Absolute: 0 10*3/uL (ref 0.0–0.1)
Basophils Relative: 0 %
Eosinophils Absolute: 0 10*3/uL (ref 0.0–0.5)
Eosinophils Relative: 0 %
HCT: 25.3 % — ABNORMAL LOW (ref 39.0–52.0)
Hemoglobin: 7.6 g/dL — ABNORMAL LOW (ref 13.0–17.0)
Immature Granulocytes: 0 %
Lymphocytes Relative: 37 %
Lymphs Abs: 1.5 10*3/uL (ref 0.7–4.0)
MCH: 29.5 pg (ref 26.0–34.0)
MCHC: 30 g/dL (ref 30.0–36.0)
MCV: 98.1 fL (ref 80.0–100.0)
Monocytes Absolute: 0.4 10*3/uL (ref 0.1–1.0)
Monocytes Relative: 9 %
Neutro Abs: 2.1 10*3/uL (ref 1.7–7.7)
Neutrophils Relative %: 54 %
Platelets: 82 10*3/uL — ABNORMAL LOW (ref 150–400)
RBC: 2.58 MIL/uL — ABNORMAL LOW (ref 4.22–5.81)
RDW: 19.6 % — ABNORMAL HIGH (ref 11.5–15.5)
WBC: 3.9 10*3/uL — ABNORMAL LOW (ref 4.0–10.5)
nRBC: 0 % (ref 0.0–0.2)

## 2022-04-03 LAB — COMPREHENSIVE METABOLIC PANEL
ALT: 24 U/L (ref 0–44)
AST: 26 U/L (ref 15–41)
Albumin: 2.3 g/dL — ABNORMAL LOW (ref 3.5–5.0)
Alkaline Phosphatase: 76 U/L (ref 38–126)
Anion gap: 5 (ref 5–15)
BUN: 33 mg/dL — ABNORMAL HIGH (ref 8–23)
CO2: 25 mmol/L (ref 22–32)
Calcium: 9 mg/dL (ref 8.9–10.3)
Chloride: 103 mmol/L (ref 98–111)
Creatinine, Ser: 0.69 mg/dL (ref 0.61–1.24)
GFR, Estimated: >60 mL/min (ref >60)
Glucose, Bld: 197 mg/dL — ABNORMAL HIGH (ref 70–99)
Potassium: 4.4 mmol/L (ref 3.5–5.1)
Sodium: 133 mmol/L — ABNORMAL LOW (ref 135–145)
Total Bilirubin: 0.4 mg/dL (ref 0.3–1.2)
Total Protein: 4.8 g/dL — ABNORMAL LOW (ref 6.5–8.1)

## 2022-04-03 LAB — GLUCOSE, CAPILLARY
Glucose-Capillary: 188 mg/dL — ABNORMAL HIGH (ref 70–99)
Glucose-Capillary: 181 mg/dL — ABNORMAL HIGH (ref 70–99)
Glucose-Capillary: 123 mg/dL — ABNORMAL HIGH (ref 70–99)
Glucose-Capillary: 108 mg/dL — ABNORMAL HIGH (ref 70–99)
Glucose-Capillary: 163 mg/dL — ABNORMAL HIGH (ref 70–99)

## 2022-04-03 LAB — MAGNESIUM: Magnesium: 1.8 mg/dL (ref 1.7–2.4)

## 2022-04-03 LAB — PHOSPHORUS: Phosphorus: 2.8 mg/dL (ref 2.5–4.6)

## 2022-04-03 MED ORDER — MAGNESIUM SULFATE 2 GM/50ML IV SOLN
2.0000 g | Freq: Once | INTRAVENOUS | Status: AC
Start: 2022-04-03 — End: 2022-04-03
  Administered 2022-04-03: 2 g via INTRAVENOUS
  Filled 2022-04-03: qty 50

## 2022-04-03 MED ORDER — LIDOCAINE 5 % EX PTCH
1.0000 | MEDICATED_PATCH | CUTANEOUS | Status: DC
  Administered 2022-04-03 – 2022-04-06 (×4): 1 via TRANSDERMAL
  Filled 2022-04-03 (×4): qty 1

## 2022-04-03 MED ORDER — IPRATROPIUM-ALBUTEROL 0.5-2.5 (3) MG/3ML IN SOLN
3.0000 mL | Freq: Two times a day (BID) | RESPIRATORY_TRACT | Status: DC
  Administered 2022-04-04 – 2022-04-06 (×5): 3 mL via RESPIRATORY_TRACT
  Filled 2022-04-03 (×5): qty 3

## 2022-04-03 NOTE — Care Management Important Message (Signed)
Important Message  Patient Details IM Letter given to the Patient. Name: [name] MRN: [id number] Date of Birth: May 03, 1933   Medicare Important Message Given:  Yes     [name] 04/03/2022, 4:16 PM

## 2022-04-03 NOTE — Progress Notes (Signed)
PROGRESS NOTE    [name]  CBS:[id number] DOB: [birth year] DOA: 03/24/2022 PCP: [name], DO   Brief Narrative:  88 y.o. male with medical history significant of intolerance of CPAP, PAF, CAD, remote history of prostate cancer, dyslipidemia, hypertension.  He was in good health until February 2023. Unfortunately in February 2023 he sustained a pelvic fracture and has been chair bound since.  12/2021 he had a PEG placed secondary to poor p.o. intake.  01/24/2022 he had cellulitis and infection around the PEG site. 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  This was initially treated with 7 days of p.o. linezolid, hematuria resolved only to return shortly after completion of linezolid.  Urology ordered with easily to be completed for 21 days.  Hematuria resolved, patient unfortunate developed urinary retention, Foley replaced.  Two days later hematuria returned and has persisted.    States the patient has a G-tube, nutrition is Jevity at 90 cc per an hour.  He also was given 140 cc of free water every 4 hours.  Yesterday he had an episode of projectile nausea and vomiting.  His tube feeds was held for 15 hours.  The next day to be resumed around lunch, within 1 hour he again had a projectile emesis.  His family is concerned for bowel obstruction.  He was seen in the ER.  There is no reports of fever or chills  In the ER patient current hemoglobin was 6.  He has been transfused 1 unit packed red blood cell.  Patient still with hematuria.  **Interim History Patient's hematuria was evaluated by urology who took the patient for cystoscopy with fulguration given that he had recurrent hemorrhagic radiation cystitis associated VRE.  His bleeding was able to be stopped and he was continued on linezolid.  Patient sound junky today so we have initiated Xopenex and Atrovent.  Palliative care was consulted for goals of care discussion.  Currently family is not interested in  hospice care at this time and patient remains at high risk for further decompensation given his bedbound status, dysphagia and cognitive changes.  SLP evaluated and planning for MBS however was not able to be done given that the patient was nauseous will defer till tomorrow.  PT reevaluated recommending home health.  ID was consulted and recommending discontinuing antibiotics and observing overnight to monitor any fevers.  Repeat chest x-ray done and showed subtle increased patchy haziness in the left lower lung concerning for pneumonia but clinically he is improving and is breath sounds are improving.   Assessment and Plan:  Acute hypoxemic respiratory failure in the setting of likely aspiration pneumonia -Aspiration pneumonia is likely the culprit given his projectile vomiting -Patient was hypoxemic in the ED with SpO2 87% -SpO2: 97 % O2 Flow Rate (L/min): (S) 1 L/min (Decreased to 1L per MD verbal order to wean O2. SpO2 is currently at 99%.) -Xopenex and Atrovent and will continue flutter valve and incentive spirometry -Will also add guaifenesin via his PEG -CT chest findings were consistent with aspiration pneumonia; CT Chest PE protocol showed "Negative for acute pulmonary embolism.  Bronchial wall thickening, mucous plugging and scattered nodular opacities greatest in the dependent lungs. Findings are compatible with bronchitis/bronchiolitis with a distribution suspicious for aspiration. Irregular bladder wall thickening. This may be due to bladder decompression however cystitis is not excluded. Recommend correlation with urinalysis. Aortic and coronary artery atherosclerotic calcification.  2.1 cm left common iliac artery aneurysm. Healing fractures of the right superior and  inferior pubic rami right sacral ala posterior right iliac bone." -Patient started on linezolid, the initial plan was for 5-day course however he has been treated with linezolid since 03/25/2022 and will likely need an  extended course given his VRE UTI however after discussion with ID they feel that he has been adequately treated and have discontinued antibiotics and recommending observing -Repeat chest x-ray this morning done and showed "Central bronchitis which was seen previously, today with subtle increased patchy haziness in the left  lower lung field concerning for pneumonia.  Cardiomegaly. Clinical correlation and radiographic  follow-up recommended. In all other respects no further changes."  -Continue with guaifenesin 15 mL per tube every 4 as needed for cough -Continue with Dulera 2 puffs IH twice daily -WBC is relatively stable from yesterday is now gone from 3.8 is now 3.9 again -Continue aspiration precautions and pulm toileting -Patient Is not safe to swallow and already has a PEG tube -Repeat chest x-ray done as above and patient improved significantly with nebs so we will need a nebulizer for home and flutter valve and incentive spirometry -Continue to monitor respiratory status carefully -C/w Palliative GOC Discussions    Hematuria Acute blood loss anemia -Secondary to acute on chronic cystitis as well as  VRE UTI -Recently treated outpatient for VRE with linezolid -On admission hemoglobin was 6.0; was transfused 3 unit PRBCs since admission -Patient's hemoglobin/marker is now 7.7/25.4 yesterday and now 7.6/25.3 -Patient was seen by urology; initially urology felt no need for further intervention or CBI at this time, treat conservatively blood transfusion and recommend Foley catheter exchanges every 3 weeks -Underwent cystoscopy yesterday found to have small hemorrhagic lesion which was fulgurated.   -Hematuria resolved. -Urology following -Continue linezolid as above and will likely need a prolonged course and will discuss with ID about treatment duration and length -Continue with doxazosin 1 mg per tube daily   Nausea and Vomiting  -CT Abd Pelvis with Contrast done and showed "Adrenals  are unremarkable. No urinary calculi or hydronephrosis. Multiple large bilateral renal cysts measuring up to 7.1 cm in the right upper pole. A few cysts are too small to adequately characterize. Cortical scarring left kidney. Foley catheter in the decompressed bladder. Gas within the bladder lumen is presumed related to Foley catheter.  irregular bladder wall thickening. Stomach/Bowel: Gastrostomy tube. Decreased stranding and subcutaneous gas at the site of the gastrostomy  since 01/24/2022. Normal caliber large and small bowel. Normal appendix. No bowel wall thickening or adjacent inflammatory change." -Continue with supportive care and antiemetics -Patient was nauseous and vomiting today    Chronic Thrombocytopenia -Platelet Count has gone from 88 -> 81 -> 82 and is now 82 again -Continue to monitor for further signs and symptoms of bleeding; it seems that his hematuria is resolved -Repeat CBC in a.m.   Sacral/heel decubitus ulcer -Local wound care with zinc oxide topically as well -Will need wound care to further evaluate   Hypophosphatemia -Replete as necessary and has resolved as phosphorus level is now 2.8 -Continue to monitor and trend and repeat phosphorus level in the morning   Diabetes Mellitus Type 2 -Patient on metformin 500 mg daily as outpatient -A1c was 5.2 on 01/06/2022 -Metformin on hold -Continue sliding scale insulin NovoLog -Well-controlled CBGs ranging from 108-188  Hyponatremia -Patient's sodium dropped from 135 is now 133 -Continue to monitor and trend and repeat CMP in the a.m.   Depression -Continue Venlafaxine 37.5 mg nightly   Adult failure to thrive -Patient has  PEG tube in place -Progressive decline since February 2023 -Overall poor prognosis -Dr. [place] discussed with patient's PCP Dr. Lajuana Ripple who recommended and agreed with palliative care consultation. -Continue with Prosource tube feeding 60 mL per tube daily along with Jevity 1.5 and 90 mL/h  over 15 hours as well as 150 mL of free water flushes every 4 hours -Palliative care has been consulted for goals of care and medical decision making and at this time the patient's family is not interested in hospice; his prognosis is extremely poor and he is at high risk for further decompensation   GERD/GI prophylaxis -Continue with Famotidine 20 mg per tube twice daily     DVT prophylaxis: SCDs Start: 03/25/22 0518 SCDs Start: 03/25/22 0516    Code Status: Full Code Family Communication: Discussed with the daughter at bedside  Disposition Plan:  Level of care: Telemetry Status is: Inpatient Remains inpatient appropriate because: Will observe him off of antibiotics and ensure that he does not spike temperatures.  Respiratory status is slowly improving and he was weaned off oxygen and will need to ensure that he is okay to go home without oxygen   Consultants:  Urology Palliative care medicine   Procedures:  Cystoscopy done with fulguration by Dr. Irine Seal  Antimicrobials:  Anti-infectives (From admission, onward)    Start     Dose/Rate Route Frequency Ordered Stop   03/31/22 1000  linezolid (ZYVOX) tablet 600 mg  Status:  Discontinued        600 mg Per Tube Every 12 hours 03/31/22 0741 04/03/22 0917   03/30/22 1000  fluconazole (DIFLUCAN) tablet 100 mg       See Hyperspace for full Linked Orders Report.   100 mg Per Tube Daily 03/29/22 1517 04/12/22 0959   03/29/22 1615  fluconazole (DIFLUCAN) tablet 200 mg       See Hyperspace for full Linked Orders Report.   200 mg Per Tube  Once 03/29/22 1517 03/29/22 1705   03/25/22 1000  ceFEPIme (MAXIPIME) 2 g in sodium chloride 0.9 % 100 mL IVPB  Status:  Discontinued        2 g 200 mL/hr over 30 Minutes Intravenous Every 12 hours 03/24/22 2226 03/27/22 0908   03/25/22 1000  linezolid (ZYVOX) IVPB 600 mg  Status:  Discontinued        600 mg 300 mL/hr over 60 Minutes Intravenous Every 12 hours 03/25/22 0559 03/31/22 0741    03/24/22 2045  ceFEPIme (MAXIPIME) 2 g in sodium chloride 0.9 % 100 mL IVPB        2 g 200 mL/hr over 30 Minutes Intravenous  Once 03/24/22 2036 03/24/22 2200   03/24/22 2045  metroNIDAZOLE (FLAGYL) IVPB 500 mg        500 mg 100 mL/hr over 60 Minutes Intravenous  Once 03/24/22 2036 03/24/22 2329       Subjective: Seen and examined at bedside and he is a little bit more awake and alert compared to yesterday.  His congestion is improved.  Patient is hard of hearing.  He worked with occupational therapy yesterday.  He is going to be working with physical therapy.  ID recommends discontinuing antibiotics and observing.  Still awaiting his MBS.  No other concerns or complaints at this time.  Objective: Vitals:   04/03/22 0918 04/03/22 1015 04/03/22 1146 04/03/22 1429  BP:   (!) 160/67   Pulse:   78   Resp:   (!) 21   Temp:   98 F (  36.7 C)   TempSrc:      SpO2: 99% 96% 94% 97%  Weight:      Height:        Intake/Output Summary (Last 24 hours) at 04/03/2022 1748 Last data filed at 04/03/2022 1617 Gross per 24 hour  Intake 200 ml  Output 1250 ml  Net -1050 ml   Filed Weights   03/31/22 0107 04/02/22 0500 04/03/22 0512  Weight: 80.8 kg 80.7 kg 78.7 kg   Examination: Physical Exam:  Constitutional: Elderly chronically ill-appearing Caucasian male who is a little more alert today Respiratory: Diminished to auscultation bilaterally with some improvement in his lung sounds, no wheezing, rales, rhonchi or crackles. Normal respiratory effort and Rhonchi is improved.  Cardiovascular: RRR, no murmurs / rubs / gallops. S1 and S2 auscultated. Trace extremity edema  Abdomen: Soft, non-tender, distended 2/2 body habitus. PEG In place Bowel sounds positive.  GU: Deferred. Musculoskeletal: No clubbing / cyanosis of digits/nails. No joint deformity upper and lower extremities.  Neurologic: CN 2-12 grossly intact with no focal deficits but the patient is hard of hearing Psychiatric: Impaired  judgment and insight. Alert and oriented x 2.   Data Reviewed: I have personally reviewed following labs and imaging studies  CBC: Recent Labs  Lab 03/30/22 0113 03/31/22 0403 04/01/22 0411 04/02/22 0453 04/03/22 0456  WBC 3.8* 4.8 3.8* 3.9* 3.9*  NEUTROABS  --   --   --   --  2.1  HGB 7.2* 8.4* 7.7* 7.7* 7.6*  HCT 24.4* 28.0* 25.6* 25.4* 25.3*  MCV 98.4 99.3 99.2 98.1 98.1  PLT 85* 88* 81* 82* 82*   Basic Metabolic Panel: Recent Labs  Lab 03/28/22 0116 03/29/22 0102 03/30/22 0113 03/31/22 0403 04/01/22 0411 04/02/22 0453 04/03/22 0456  NA 139 134* 132* 137 138 135 133*  K 3.9 4.8 4.2 4.6 5.0 4.6 4.4  CL 109 106 102 107 105 103 103  CO2 '24 22 24 26 27 27 25  '$ GLUCOSE 245* 181* 200* 122* 140* 146* 197*  BUN 32* 31* 28* 23 23 35* 33*  CREATININE 0.68 0.67 0.64 0.52* 0.68 0.71 0.69  CALCIUM 8.8* 8.6* 8.7* 9.0 8.9 8.8* 9.0  MG 1.9 1.9  --   --   --  2.0 1.8  PHOS 2.2* 2.8  --   --   --  2.9 2.8   GFR: Estimated Creatinine Clearance: 69.3 mL/min (by C-G formula based on SCr of 0.69 mg/dL). Liver Function Tests: Recent Labs  Lab 04/01/22 0411 04/02/22 0453 04/03/22 0456  AST 44* 29 26  ALT '26 28 24  '$ ALKPHOS 75 79 76  BILITOT 0.6 0.4 0.4  PROT 4.9* 4.7* 4.8*  ALBUMIN 2.2* 2.3* 2.3*   No results for input(s): "LIPASE", "AMYLASE" in the last 168 hours. No results for input(s): "AMMONIA" in the last 168 hours. Coagulation Profile: No results for input(s): "INR", "PROTIME" in the last 168 hours. Cardiac Enzymes: No results for input(s): "CKTOTAL", "CKMB", "CKMBINDEX", "TROPONINI" in the last 168 hours. BNP (last 3 results) No results for input(s): "PROBNP" in the last 8760 hours. HbA1C: No results for input(s): "HGBA1C" in the last 72 hours. CBG: Recent Labs  Lab 04/02/22 2350 04/03/22 0455 04/03/22 0733 04/03/22 1144 04/03/22 1607  GLUCAP 147* 188* 181* 123* 108*   Lipid Profile: No results for input(s): "CHOL", "HDL", "LDLCALC", "TRIG", "CHOLHDL",  "LDLDIRECT" in the last 72 hours. Thyroid Function Tests: No results for input(s): "TSH", "T4TOTAL", "FREET4", "T3FREE", "THYROIDAB" in the last 72 hours. Anemia Panel:  No results for input(s): "VITAMINB12", "FOLATE", "FERRITIN", "TIBC", "IRON", "RETICCTPCT" in the last 72 hours. Sepsis Labs: No results for input(s): "PROCALCITON", "LATICACIDVEN" in the last 168 hours.  Recent Results (from the past 240 hour(s))  Urine Culture     Status: Abnormal   Collection Time: 03/24/22  6:19 PM   Specimen: In/Out Cath Urine  Result Value Ref Range Status   Specimen Description IN/OUT CATH URINE  Final   Special Requests   Final    NONE Performed at [facility], [address] [place], [place] [address]    Culture (A)  Final    1,000 COLONIES/mL ENTEROCOCCUS FAECIUM VANCOMYCIN RESISTANT ENTEROCOCCUS    Report Status 03/26/2022 FINAL  Final   Organism ID, Bacteria ENTEROCOCCUS FAECIUM (A)  Final      Susceptibility   Enterococcus faecium - MIC*    AMPICILLIN >=32 RESISTANT Resistant     NITROFURANTOIN 32 SENSITIVE Sensitive     VANCOMYCIN >=32 RESISTANT Resistant     LINEZOLID 2 SENSITIVE Sensitive     * 1,000 COLONIES/mL ENTEROCOCCUS FAECIUM  Blood Culture (routine x 2)     Status: None   Collection Time: 03/24/22  7:05 PM   Specimen: BLOOD  Result Value Ref Range Status   Specimen Description BLOOD LEFT ANTECUBITAL  Final   Special Requests   Final    BOTTLES DRAWN AEROBIC AND ANAEROBIC Blood Culture results may not be optimal due to an inadequate volume of blood received in culture bottles   Culture   Final    NO GROWTH 5 DAYS Performed at [facility], [place] [address] [place], [place] [address]    Report Status 03/29/2022 FINAL  Final  Blood Culture (routine x 2)     Status: None   Collection Time: 03/24/22  7:14 PM   Specimen: BLOOD  Result Value Ref Range Status   Specimen Description BLOOD RIGHT ANTECUBITAL  Final   Special Requests   Final    BOTTLES DRAWN  AEROBIC AND ANAEROBIC Blood Culture adequate volume   Culture   Final    NO GROWTH 5 DAYS Performed at [facility], [place] [address], [place], [place] [address]    Report Status 03/29/2022 FINAL  Final    Radiology Studies: DG CHEST PORT 1 VIEW  Result Date: 04/03/2022 CLINICAL DATA:  [id number].  Shortness of breath. EXAM: PORTABLE CHEST 1 VIEW COMPARISON:  Portable chest and CTA chest both 03/24/2022. FINDINGS: 4:54 a.m. Left chest dual lead pacing system and wire insertions are stable as well as CABG change. The heart is enlarged, also unchanged. The aorta is tortuous with moderate calcification, stable mediastinum. No vascular congestion is seen. There is bronchial thickening centrally consistent with bronchitis. Subtle increased patchy haziness left lower lung field concerning for pneumonia/pneumonitis. Remaining lungs are generally clear.  The sulci are sharp. Thoracic spondylosis and mild dextroscoliosis with osteopenia and chronic rotator cuff arthropathy. IMPRESSION: Central bronchitis which was seen previously, today with subtle increased patchy haziness in the left lower lung field concerning for pneumonia. Cardiomegaly. Clinical correlation and radiographic follow-up recommended. In all other respects no further changes. Electronically Signed   By: Telford Nab M.D.   On: 04/03/2022 07:13     Scheduled Meds:  amiodarone  200 mg Per Tube Daily   Chlorhexidine Gluconate Cloth  6 each Topical Daily   doxazosin  1 mg Per Tube Daily   famotidine  20 mg Per Tube BID   feeding supplement (PROSource  TF20)  60 mL Per Tube Daily   ferrous gluconate  324 mg Oral QPM   fluconazole  100 mg Per Tube Daily   free water  150 mL Per Tube Q4H   insulin aspart  0-5 Units Subcutaneous QHS   insulin aspart  0-9 Units Subcutaneous Q4H   ipratropium  0.5 mg Nebulization TID   levalbuterol  0.63 mg Nebulization TID   lidocaine  1 patch Transdermal Q24H   mometasone-formoterol  2 puff Inhalation BID    nutrition supplement (JUVEN)  1 packet Per Tube BID BM   venlafaxine  37.5 mg Per Tube QHS   Zinc Oxide   Topical BID   Continuous Infusions:  feeding supplement (JEVITY 1.5 CAL/FIBER) 1,000 mL (04/02/22 1630)    LOS: 9 days   Raiford Noble, DO [facility] Available via Epic secure chat 7am-7pm After these hours, please refer to coverage provider listed on [email or web address] 04/03/2022, 5:48 PM

## 2022-04-03 NOTE — Evaluation (Signed)
Physical Therapy Evaluation Patient Details Name: [name] MRN: [id number] DOB: 07/28/1933 Today's Date: 04/03/2022  History of Present Illness  88 y.o. male with medical history significant of intolerance of CPAP, PAF, CAD, remote history of prostate cancer, dyslipidemia, hypertension.  He was in good health until February 2023.  Unfortunately in February 2023 he sustained a pelvic fracture and has been chair bound since.  12/2021 he had a PEG placed secondary to poor p.o. intake.  01/24/2022 he had cellulitis and infection around the PEG site.  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 Patient admitted for Acute hypoxemic respiratory failure in the setting of likely aspiration pneumonia    Clinical Impression  [name] is 88 y.o. male admitted with above HPI and diagnosis. Patient is currently limited by functional impairments below (see PT problem list). Patient lives with his family and has been primarily bed-bound with Total Assist required for bed<>wheelchair transfers in last 2 months at baseline. Patient will benefit from continued skilled PT interventions to address impairments and progress independence with mobility, recommending pt return home with family and 24/7 care, family will need hoyer lift. Acute PT will follow and progress as able.        Recommendations for follow up therapy are one component of a multi-disciplinary discharge planning process, led by the attending physician.  Recommendations may be updated based on patient status, additional functional criteria and insurance authorization.  Follow Up Recommendations Home health PT Can patient physically be transported by private vehicle: No    Assistance Recommended at Discharge Frequent or constant Supervision/Assistance  Patient can return home with the following  Two people to help with walking and/or transfers;Two people to help with bathing/dressing/bathroom;Assistance with  cooking/housework;Assistance with feeding;Direct supervision/assist for medications management;Direct supervision/assist for financial management;Help with stairs or ramp for entrance;Assist for transportation    Equipment Recommendations Other (comment) Optician, dispensing at home)  Recommendations for Other Services       Functional Status Assessment Patient has had a recent decline in their functional status and/or demonstrates limited ability to make significant improvements in function in a reasonable and predictable amount of time     Precautions / Restrictions Precautions Precautions: Fall Precaution Comments: Peg tube, Restrictions Weight Bearing Restrictions: No      Mobility  Bed Mobility Overal bed mobility: Needs Assistance Bed Mobility: Supine to Sit, Sit to Supine     Supine to sit: Max assist, Total assist, +2 for physical assistance, +2 for safety/equipment Sit to supine: Max assist, Total assist, +2 for physical assistance, +2 for safety/equipment   General bed mobility comments: MAX+2/Total Assist for bringing LE's off EOB and raising trunk. Pt reuqired constant posterior support to maintain balance wtih max assist at start and improvign to min guard/assist.    Transfers Overall transfer level: Needs assistance   Transfers: Sit to/from Stand             General transfer comment: Attempeted Sit<>Stand with MAX+2 assist from elevated EOB, pt with no effort to initiate LE activation to rise. attempted x2 with no success. returned to bed and placed in chair position. Transfer via Lift Equipment: Stedy  Ambulation/Gait                  Stairs            Wheelchair Mobility    Modified Rankin (Stroke Patients Only)       Balance  Pertinent Vitals/Pain Pain Assessment Pain Assessment: PAINAD Breathing: occasional labored breathing, short period of hyperventilation Negative  Vocalization: repeated troubled calling out, loud moaning/groaning, crying Facial Expression: sad, frightened, frown Body Language: tense, distressed pacing, fidgeting Consolability: distracted or reassured by voice/touch PAINAD Score: 6 Pain Location: guarded and grimacing to almost all movement and assessment Pain Descriptors / Indicators: Grimacing, Moaning Pain Intervention(s): Limited activity within patient's tolerance, Monitored during session, Repositioned    Home Living Family/patient expects to be discharged to:: Private residence Living Arrangements: Children;Other relatives Available Help at Discharge: Family;Available 24 hours/day Type of Home: House Home Access: Stairs to enter   [employer] of Steps: 1 through garage and 1 at sunroom Alternate Level Stairs-Number of Steps: 15 stairs Home Layout: Two level;Laundry or work area in [place]: Conservation officer, nature (2 wheels);Shower seat;BSC/3in1;Cane - single point;Cane - quad;Rollator (4 wheels);Grab bars - tub/shower;Hand held shower head;Hospital bed;Wheelchair - manual (had hoyer lift but got rid of it) Additional Comments: Has about 12 walking sticks.    Prior Function Prior Level of Function : Needs assist             Mobility Comments: In april patient was walking with cane. Currently mostly bed bound - family transfers him to wheelchair daily. has been extremely limited since Admission in April with pelvic fractures ADLs Comments: 2 months ago he was independent - now he is near total assist     Hand Dominance   Dominant Hand: Right    Extremity/Trunk Assessment   Upper Extremity Assessment Upper Extremity Assessment: Defer to OT evaluation    Lower Extremity Assessment Lower Extremity Assessment: Generalized weakness    Cervical / Trunk Assessment Cervical / Trunk Assessment: Kyphotic  Communication   Communication: HOH  Cognition Arousal/Alertness: Awake/alert, Lethargic  (slightly lethargic at times, fatigues quickly) Behavior During Therapy: Flat affect Overall Cognitive Status: History of cognitive impairments - at baseline                                          General Comments      Exercises     Assessment/Plan    PT Assessment Patient needs continued PT services  PT Problem List Decreased strength;Decreased activity tolerance;Decreased balance;Decreased mobility;Decreased coordination;Decreased cognition;Decreased knowledge of use of DME;Decreased safety awareness;Decreased knowledge of precautions;Obesity       PT Treatment Interventions DME instruction;Functional mobility training;Therapeutic activities;Therapeutic exercise;Balance training;Neuromuscular re-education;Patient/family education;Wheelchair mobility training;Cognitive remediation    PT Goals (Current goals can be found in the Care Plan section)  Acute Rehab PT Goals Patient Stated Goal: daughter wants pt to walk again, pt not able to state goals PT Goal Formulation: With family Time For Goal Achievement: 04/17/22 Potential to Achieve Goals: Poor    Frequency Min 2X/week     Co-evaluation               AM-PAC PT "6 Clicks" Mobility  Outcome Measure Help needed turning from your back to your side while in a flat bed without using bedrails?: Total Help needed moving from lying on your back to sitting on the side of a flat bed without using bedrails?: Total Help needed moving to and from a bed to a chair (including a wheelchair)?: Total Help needed standing up from a chair using your arms (e.g., wheelchair or bedside chair)?: Total Help needed to walk in hospital room?: Total Help needed climbing 3-5 steps  with a railing? : Total 6 Click Score: 6    End of Session   Activity Tolerance: Patient limited by fatigue;Patient limited by lethargy;Patient limited by pain Patient left: in bed;with call bell/phone within reach;with bed alarm set;with  family/visitor present Nurse Communication: Mobility status;Need for lift equipment PT Visit Diagnosis: Other abnormalities of gait and mobility (R26.89);History of falling (Z91.81);Muscle weakness (generalized) (M62.81);Difficulty in walking, not elsewhere classified (R26.2);Other symptoms and signs involving the nervous system (R29.898)    Time: 6754-4920 PT Time Calculation (min) (ACUTE ONLY): 26 min   Charges:   PT Evaluation $PT Eval Moderate Complexity: 1 Mod PT Treatments $Therapeutic Activity: 8-22 mins        Verner Mould, DPT Acute Rehabilitation Services Office [phone] Pager [phone]  04/03/22 4:18 PM

## 2022-04-03 NOTE — Progress Notes (Signed)
SLP Cancellation Note  Patient Details Name: [name] MRN: [id number] DOB: 01-12-1933   Cancelled treatment:       Reason Eval/Treat Not Completed: Other (comment) (pt currently nauseous per PT who had just worked with him, he has a PEG tube for nutrition and daughter, [name] wants him to be able to eat; please see SLP note from yesterday)  Kathleen Lime, [place] [phone] Pager [phone]  Macario Golds 04/03/2022, 12:02 PM

## 2022-04-03 NOTE — Progress Notes (Signed)
PT Cancellation Note  Patient Details Name: ALDAHIR LITAKER MRN: [id number] DOB: [birth year]   Cancelled Treatment:    Reason Eval/Treat Not Completed: Medical issues which prohibited therapy;Other (comment) (Pt nauseous and gagging in bed. RN notified for nausea meds, will hold and attempt as schedule allows and pt able.)  Gwynneth Albright PT, DPT Acute Rehabilitation Services Office [phone] Pager [phone]  04/03/22 11:22 AM

## 2022-04-03 NOTE — Progress Notes (Signed)
Patient ID: [name], male   DOB: 1932/12/04, 88 y.o.   MRN: [id number]    Progress Note from the Palliative Medicine Team at [facility]   Patient Name: [name]        Date: 04/03/2022 DOB: [birth year]  Age: 88 y.o. MRN#: [id number] Attending Physician: Kerney Elbe, DO Primary Care Physician: [name], DO Admit Date: 03/24/2022   Medical records reviewed   88 y.o. male  admitted on 03/24/2022 with past medical history significant  PAF on Eliquis, HFrEF (EF 35% by TTE 04/25/2020), CAD s/p CABG, Mobitz 2 second-degree AV block s/p PPM, COPD, T2DM, HTN, HLD, thrombocytopenia, recurrent UTIs, OSA not tolerating CPAP,  AMS.   Was seen for the first time by PMT for consultation on the past hospitalization on Jan 07, 2022.  During that hospitalization a PEG tube was placed for nutritional support.   Patient had another hospitalization in June 2023 secondary to cellulitis and infection around the PEG site.  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  He was treated and follow by urology/Dr. Jeffie Pollock.     Patient has been cared for in his home since that time.  His daughter [name] is the main caregiver along with wife and other family members.    S/p Cystoscopy, patient tolerated it well and urine is clear.      This nurse practitioner went to visit patient at bedside for follow-up for palliative medicine needs and emotional support.  Patient is resting comfortably.  I spoke to daughter/[name] by telephone expressed my concern for overall comfort and quality of life for [name] at this time.  She verbalizes understanding and remains hopeful for improvement now that hematuria seems to have been.   Ongoing education regarding the likely trajectory and patient's high risk for decompensation secondary to bedbound status, dysphagia, cognitive changes and high risk for infection.  Family plan for patient to return home, daughter  is not interested  in hospice care at this time.  Family is requesting home health services and physical therapy in the home.  PMT will continue to support holistically.  Questions and concerns addressed     Wadie Lessen NP  Palliative Medicine Team Team Phone # [phone] Pager [phone]
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
## 2022-04-04 DIAGNOSIS — D649 Anemia, unspecified: Secondary | ICD-10-CM | POA: Diagnosis not present

## 2022-04-04 DIAGNOSIS — J9601 Acute respiratory failure with hypoxia: Secondary | ICD-10-CM | POA: Diagnosis not present

## 2022-04-04 DIAGNOSIS — N3001 Acute cystitis with hematuria: Secondary | ICD-10-CM | POA: Diagnosis not present

## 2022-04-04 DIAGNOSIS — N179 Acute kidney failure, unspecified: Secondary | ICD-10-CM | POA: Diagnosis not present

## 2022-04-04 LAB — GLUCOSE, CAPILLARY
Glucose-Capillary: 119 mg/dL — ABNORMAL HIGH (ref 70–99)
Glucose-Capillary: 126 mg/dL — ABNORMAL HIGH (ref 70–99)
Glucose-Capillary: 158 mg/dL — ABNORMAL HIGH (ref 70–99)
Glucose-Capillary: 177 mg/dL — ABNORMAL HIGH (ref 70–99)
Glucose-Capillary: 178 mg/dL — ABNORMAL HIGH (ref 70–99)
Glucose-Capillary: 198 mg/dL — ABNORMAL HIGH (ref 70–99)
Glucose-Capillary: 226 mg/dL — ABNORMAL HIGH (ref 70–99)

## 2022-04-04 LAB — CBC WITH DIFFERENTIAL/PLATELET
Abs Immature Granulocytes: 0.01 10*3/uL (ref 0.00–0.07)
Basophils Absolute: 0 10*3/uL (ref 0.0–0.1)
Basophils Relative: 0 %
Eosinophils Absolute: 0 10*3/uL (ref 0.0–0.5)
Eosinophils Relative: 0 %
HCT: 25.8 % — ABNORMAL LOW (ref 39.0–52.0)
Hemoglobin: 7.8 g/dL — ABNORMAL LOW (ref 13.0–17.0)
Immature Granulocytes: 0 %
Lymphocytes Relative: 31 %
Lymphs Abs: 1.3 10*3/uL (ref 0.7–4.0)
MCH: 29.5 pg (ref 26.0–34.0)
MCHC: 30.2 g/dL (ref 30.0–36.0)
MCV: 97.7 fL (ref 80.0–100.0)
Monocytes Absolute: 0.4 10*3/uL (ref 0.1–1.0)
Monocytes Relative: 11 %
Neutro Abs: 2.4 10*3/uL (ref 1.7–7.7)
Neutrophils Relative %: 58 %
Platelets: 80 10*3/uL — ABNORMAL LOW (ref 150–400)
RBC: 2.64 MIL/uL — ABNORMAL LOW (ref 4.22–5.81)
RDW: 19.4 % — ABNORMAL HIGH (ref 11.5–15.5)
WBC: 4.1 10*3/uL (ref 4.0–10.5)
nRBC: 0 % (ref 0.0–0.2)

## 2022-04-04 LAB — COMPREHENSIVE METABOLIC PANEL
ALT: 23 U/L (ref 0–44)
AST: 21 U/L (ref 15–41)
Albumin: 2.4 g/dL — ABNORMAL LOW (ref 3.5–5.0)
Alkaline Phosphatase: 81 U/L (ref 38–126)
Anion gap: 5 (ref 5–15)
BUN: 32 mg/dL — ABNORMAL HIGH (ref 8–23)
CO2: 29 mmol/L (ref 22–32)
Calcium: 9.1 mg/dL (ref 8.9–10.3)
Chloride: 102 mmol/L (ref 98–111)
Creatinine, Ser: 0.65 mg/dL (ref 0.61–1.24)
GFR, Estimated: >60 mL/min (ref >60)
Glucose, Bld: 203 mg/dL — ABNORMAL HIGH (ref 70–99)
Potassium: 4.5 mmol/L (ref 3.5–5.1)
Sodium: 136 mmol/L (ref 135–145)
Total Bilirubin: 0.5 mg/dL (ref 0.3–1.2)
Total Protein: 5.1 g/dL — ABNORMAL LOW (ref 6.5–8.1)

## 2022-04-04 LAB — MAGNESIUM: Magnesium: 2.2 mg/dL (ref 1.7–2.4)

## 2022-04-04 LAB — PHOSPHORUS: Phosphorus: 2.7 mg/dL (ref 2.5–4.6)

## 2022-04-04 MED ORDER — NITROFURANTOIN MACROCRYSTAL 50 MG PO CAPS
50.0000 mg | ORAL_CAPSULE | Freq: Every day | ORAL | Status: DC
  Administered 2022-04-04 – 2022-04-05 (×2): 50 mg
  Filled 2022-04-04 (×3): qty 1

## 2022-04-04 MED ORDER — NITROFURANTOIN 25 MG/5ML PO SUSP
50.0000 mg | Freq: Every day | ORAL | Status: DC
Start: 2022-04-04 — End: 2022-04-04

## 2022-04-04 NOTE — Progress Notes (Signed)
PROGRESS NOTE    [name]  ZDG:[id number] DOB: 1933/03/02 DOA: 03/24/2022 PCP: [name], DO   Brief Narrative:  88 y.o. male with medical history significant of intolerance of CPAP, PAF, CAD, remote history of prostate cancer, dyslipidemia, hypertension.  He was in good health until February 2023. Unfortunately in February 2023 he sustained a pelvic fracture and has been chair bound since.  12/2021 he had a PEG placed secondary to poor p.o. intake.  01/24/2022 he had cellulitis and infection around the PEG site. 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  This was initially treated with 7 days of p.o. linezolid, hematuria resolved only to return shortly after completion of linezolid.  Urology ordered with easily to be completed for 21 days.  Hematuria resolved, patient unfortunate developed urinary retention, Foley replaced.  Two days later hematuria returned and has persisted.    States the patient has a G-tube, nutrition is Jevity at 90 cc per an hour.  He also was given 140 cc of free water every 4 hours.  Yesterday he had an episode of projectile nausea and vomiting.  His tube feeds was held for 15 hours.  The next day to be resumed around lunch, within 1 hour he again had a projectile emesis.  His family is concerned for bowel obstruction.  He was seen in the ER.  There is no reports of fever or chills  In the ER patient current hemoglobin was 6.  He has been transfused 1 unit packed red blood cell.  Patient still with hematuria.  **Interim History Patient's hematuria was evaluated by urology who took the patient for cystoscopy with fulguration given that he had recurrent hemorrhagic radiation cystitis associated VRE.  His bleeding was able to be stopped and he was continued on linezolid.  Patient sound junky today so we have initiated Xopenex and Atrovent.  Palliative care was consulted for goals of care discussion.  Currently family is not interested in  hospice care at this time and patient remains at high risk for further decompensation given his bedbound status, dysphagia and cognitive changes.  SLP evaluated and planning for MBS however was not able to be done given that the patient was nauseous will defer till tomorrow.  PT reevaluated recommending home health.  ID was consulted and recommending discontinuing antibiotics and observing overnight to monitor any fevers.  Repeat chest x-ray done and showed subtle increased patchy haziness in the left lower lung concerning for pneumonia but clinically he is improving and is breath sounds are improving.  Patient is getting close to being discharged but family continues to request the MBS to see if he can be fed however I doubt he will be able to safely swallow.  Currently he is not at goal for his tube feedings and will be increasing to goal rate hopefully by tomorrow.  After discussion with ID and urology will place him on prophylactic antibiotics with Macrobid 50 mg nightly.   Assessment and Plan:  Acute hypoxemic respiratory failure in the setting of likely aspiration pneumonia -Aspiration pneumonia is likely the culprit given his projectile vomiting -Patient was hypoxemic in the ED with SpO2 87% -SpO2: 95 % O2 Flow Rate (L/min): (S) 1 L/min (Decreased to 1L per MD verbal order to wean O2. SpO2 is currently at 99%.) -Xopenex and Atrovent and will continue flutter valve and incentive spirometry -Will also add guaifenesin via his PEG -CT chest findings were consistent with aspiration pneumonia; CT Chest PE protocol showed "Negative  for acute pulmonary embolism.  Bronchial wall thickening, mucous plugging and scattered nodular opacities greatest in the dependent lungs. Findings are compatible with bronchitis/bronchiolitis with a distribution suspicious for aspiration. Irregular bladder wall thickening. This may be due to bladder decompression however cystitis is not excluded. Recommend correlation with  urinalysis. Aortic and coronary artery atherosclerotic calcification.  2.1 cm left common iliac artery aneurysm. Healing fractures of the right superior and inferior pubic rami right sacral ala posterior right iliac bone." -Patient started on linezolid, the initial plan was for 5-day course however he has been treated with linezolid since 03/25/2022 and will likely need an extended course given his VRE UTI however after discussion with ID they feel that he has been adequately treated and have discontinued antibiotics and recommending observing; after further discussion with ID and urology will place on prophylactic antibiotics with nitrofurantoin 50 mg p.o. nightly via the PEG -Repeat chest x-ray this morning done and showed "Central bronchitis which was seen previously, today with subtle increased patchy haziness in the left  lower lung field concerning for pneumonia.  Cardiomegaly. Clinical correlation and radiographic  follow-up recommended. In all other respects no further changes."  -Continue with guaifenesin 15 mL per tube every 4 as needed for cough -Continue with Dulera 2 puffs IH twice daily -WBC is relatively stable and is now 4.1 -Continue aspiration precautions and pulm toileting -Patient Is not safe to swallow and already has a PEG tube -Repeat chest x-ray done as above  -Patient improved significantly with nebs so we will need a nebulizer for home and flutter valve and incentive spirometry -Continue to monitor respiratory status carefully -C/w Palliative GOC Discussions; family continues to request a formal MBS to see if they can feed him however I doubt he will have the ability to pass a swallow screen given his current condition   Hematuria Acute blood loss anemia -Secondary to acute on chronic cystitis as well as  VRE UTI -Recently treated outpatient for VRE with linezolid -On admission hemoglobin was 6.0; was transfused 3 unit PRBCs since admission -Patient's hemoglobin/hematocrit  has gone from 7.7/25.4 -> 7.6/25.3 and is now 7.8/25.8 -Patient was seen by urology; initially urology felt no need for further intervention or CBI at this time, treat conservatively blood transfusion and recommend Foley catheter exchanges every 3 weeks -Underwent cystoscopy yesterday found to have small hemorrhagic lesion which was fulgurated.   -Hematuria resolved. -Urology following -Continue linezolid as above and will likely need a prolonged course and will discuss with ID about treatment duration and length -Continue with Doxazosin 1 mg per tube daily   Nausea and Vomiting  -CT Abd Pelvis with Contrast done and showed "Adrenals are unremarkable. No urinary calculi or hydronephrosis. Multiple large bilateral renal cysts measuring up to 7.1 cm in the right upper pole. A few cysts are too small to adequately characterize. Cortical scarring left kidney. Foley catheter in the decompressed bladder. Gas within the bladder lumen is presumed related to Foley catheter.  irregular bladder wall thickening. Stomach/Bowel: Gastrostomy tube. Decreased stranding and subcutaneous gas at the site of the gastrostomy  since 01/24/2022. Normal caliber large and small bowel. Normal appendix. No bowel wall thickening or adjacent inflammatory change." -Continue with supportive care and antiemetics -Patient was nauseous and vomiting yesterday but today he is improved.  Continues to not be appropriate for MBS given his inability to follow commands    Chronic Thrombocytopenia -Platelet Count has gone from 88 -> 81 -> 82 x2 and is now 80 -Continue  to monitor for further signs and symptoms of bleeding; it seems that his hematuria is resolved -Repeat CBC in a.m.   Sacral/heel decubitus ulcer -Local wound care with zinc oxide topically as well -Will need wound care to further evaluate   Hypophosphatemia -Replete as necessary and has resolved as phosphorus level is now 2.7 -Continue to monitor and trend and repeat  phosphorus level in the morning   Diabetes Mellitus Type 2 -Patient on metformin 500 mg daily as outpatient -A1c was 5.2 on 01/06/2022 -Metformin on hold -Continue sliding scale insulin NovoLog -Well-controlled CBGs ranging from 119-198   Hyponatremia -Patient's sodium dropped from 135 is now 133 yesterday and today it is 136 -Continue to monitor and trend and repeat CMP in the a.m.   Depression -Continue Venlafaxine 37.5 mg nightly   Adult failure to thrive -Patient has PEG tube in place; family continuing to request MBS to see if he can swallow however I doubt he will be able to -Progressive decline since February 2023 -Overall poor prognosis -Dr. [place] discussed with patient's PCP Dr. Lajuana Ripple who recommended and agreed with palliative care consultation. -Continue with Prosource tube feeding 60 mL per tube daily along with Jevity 1.5 and 90 mL/h over 15 hours as well as 150 mL of free water flushes every 4 hours; currently his tube feedings were not at goal yet and he was on 78 MLS per hour and will need to go up to his 90 -Palliative care has been consulted for goals of care and medical decision making and at this time the patient's family is not interested in hospice; his prognosis is extremely poor and he is at high risk for further decompensation   GERD/GI prophylaxis -Continue with Famotidine 20 mg per tube twice daily   DVT prophylaxis: SCDs Start: 03/25/22 0518 SCDs Start: 03/25/22 0516    Code Status: Full Code Family Communication: Discussed with his whole family at bedside including his wife, daughter  Disposition Plan:  Level of care: Telemetry Status is: Inpatient Remains inpatient appropriate because: We will need to ensure that he can adequately tolerate his goal rate of tube feedings and family is requesting MBS prior to discharge as well as arranging a Hoyer lift and a nebulizer.   Consultants:  Urology Infectious diseases Palliative care  medicine  Procedures:  Cystoscopy done with fulguration by Dr. Irine Seal  Antimicrobials:  Anti-infectives (From admission, onward)    Start     Dose/Rate Route Frequency Ordered Stop   04/04/22 2200  nitrofurantoin (FURADANTIN) 25 MG/5ML suspension 50 mg  Status:  Discontinued        50 mg Per Tube Daily at bedtime 04/04/22 1003 04/04/22 1011   04/04/22 2200  nitrofurantoin (MACRODANTIN) capsule 50 mg        50 mg Per Tube Daily at bedtime 04/04/22 1011     03/31/22 1000  linezolid (ZYVOX) tablet 600 mg  Status:  Discontinued        600 mg Per Tube Every 12 hours 03/31/22 0741 04/03/22 0917   03/30/22 1000  fluconazole (DIFLUCAN) tablet 100 mg       See Hyperspace for full Linked Orders Report.   100 mg Per Tube Daily 03/29/22 1517 04/12/22 0959   03/29/22 1615  fluconazole (DIFLUCAN) tablet 200 mg       See Hyperspace for full Linked Orders Report.   200 mg Per Tube  Once 03/29/22 1517 03/29/22 1705   03/25/22 1000  ceFEPIme (MAXIPIME) 2 g in sodium chloride  0.9 % 100 mL IVPB  Status:  Discontinued        2 g 200 mL/hr over 30 Minutes Intravenous Every 12 hours 03/24/22 2226 03/27/22 0908   03/25/22 1000  linezolid (ZYVOX) IVPB 600 mg  Status:  Discontinued        600 mg 300 mL/hr over 60 Minutes Intravenous Every 12 hours 03/25/22 0559 03/31/22 0741   03/24/22 2045  ceFEPIme (MAXIPIME) 2 g in sodium chloride 0.9 % 100 mL IVPB        2 g 200 mL/hr over 30 Minutes Intravenous  Once 03/24/22 2036 03/24/22 2200   03/24/22 2045  metroNIDAZOLE (FLAGYL) IVPB 500 mg        500 mg 100 mL/hr over 60 Minutes Intravenous  Once 03/24/22 2036 03/24/22 2329       Subjective: Seen and examined at bedside and he is much more awake and alert but still does not follow commands properly.  He will respond to questioning but then zoned out.  His congestion is significant improved.  Continues to be hard of hearing.  He has been off of antibiotics for 24 hours and he is improved.  Continues to still  wait for MBS and family continues to ask if they can feed him.  No other concerns or complaints at this time.  Objective: Vitals:   04/04/22 0414 04/04/22 0435 04/04/22 0821 04/04/22 1330  BP: (!) 129/54   (!) 128/59  Pulse: 78   78  Resp: 19   18  Temp: 98.1 F (36.7 C)   (!) 97.4 F (36.3 C)  TempSrc:    Oral  SpO2: 98%  97% 95%  Weight:  78.6 kg    Height:        Intake/Output Summary (Last 24 hours) at 04/04/2022 1648 Last data filed at 04/04/2022 1230 Gross per 24 hour  Intake 240 ml  Output 1375 ml  Net -1135 ml   Filed Weights   04/02/22 0500 04/03/22 0512 04/04/22 0435  Weight: 80.7 kg 78.7 kg 78.6 kg   Examination: Physical Exam:  Constitutional: Elderly chronically ill-appearing Caucasian male who is more awake and alert today but zones out easily Respiratory: Diminished to auscultation bilaterally with coarse breath sounds, no wheezing, rales, rhonchi or crackles. Normal respiratory effort and patient is not tachypenic. No accessory muscle use.  Rhonchi is improved Cardiovascular: RRR, no murmurs / rubs / gallops. S1 and S2 auscultated.  Trace extremity edema Abdomen: Soft, non-tender, distended secondary to body habitus with a PEG tube in place.  Bowel sounds positive.  GU: Deferred. Musculoskeletal: No clubbing / cyanosis of digits/nails. No joint deformity upper and lower extremities.  Skin: No rashes, lesions, ulcers on limited skin evaluation. No induration; Warm and dry.  Neurologic: CN 2-12 grossly intact with no focal deficits. Romberg sign and cerebellar reflexes not assessed.  Psychiatric: Normal judgment and insight. Alert and oriented x 3. Normal mood and appropriate affect.   Data Reviewed: I have personally reviewed following labs and imaging studies  CBC: Recent Labs  Lab 03/31/22 0403 04/01/22 0411 04/02/22 0453 04/03/22 0456 04/04/22 0537  WBC 4.8 3.8* 3.9* 3.9* 4.1  NEUTROABS  --   --   --  2.1 2.4  HGB 8.4* 7.7* 7.7* 7.6* 7.8*  HCT  28.0* 25.6* 25.4* 25.3* 25.8*  MCV 99.3 99.2 98.1 98.1 97.7  PLT 88* 81* 82* 82* 80*   Basic Metabolic Panel: Recent Labs  Lab 03/29/22 0102 03/30/22 0113 03/31/22 0403 04/01/22 0411 04/02/22  0109 04/03/22 0456 04/04/22 0537  NA 134*   < > 137 138 135 133* 136  K 4.8   < > 4.6 5.0 4.6 4.4 4.5  CL 106   < > 107 105 103 103 102  CO2 22   < > '26 27 27 25 29  '$ GLUCOSE 181*   < > 122* 140* 146* 197* 203*  BUN 31*   < > 23 23 35* 33* 32*  CREATININE 0.67   < > 0.52* 0.68 0.71 0.69 0.65  CALCIUM 8.6*   < > 9.0 8.9 8.8* 9.0 9.1  MG 1.9  --   --   --  2.0 1.8 2.2  PHOS 2.8  --   --   --  2.9 2.8 2.7   < > = values in this interval not displayed.   GFR: Estimated Creatinine Clearance: 69.3 mL/min (by C-G formula based on SCr of 0.65 mg/dL). Liver Function Tests: Recent Labs  Lab 04/01/22 0411 04/02/22 0453 04/03/22 0456 04/04/22 0537  AST 44* '29 26 21  '$ ALT '26 28 24 23  '$ ALKPHOS 75 79 76 81  BILITOT 0.6 0.4 0.4 0.5  PROT 4.9* 4.7* 4.8* 5.1*  ALBUMIN 2.2* 2.3* 2.3* 2.4*   No results for input(s): "LIPASE", "AMYLASE" in the last 168 hours. No results for input(s): "AMMONIA" in the last 168 hours. Coagulation Profile: No results for input(s): "INR", "PROTIME" in the last 168 hours. Cardiac Enzymes: No results for input(s): "CKTOTAL", "CKMB", "CKMBINDEX", "TROPONINI" in the last 168 hours. BNP (last 3 results) No results for input(s): "PROBNP" in the last 8760 hours. HbA1C: No results for input(s): "HGBA1C" in the last 72 hours. CBG: Recent Labs  Lab 04/04/22 0035 04/04/22 0409 04/04/22 0741 04/04/22 1152 04/04/22 1617  GLUCAP 178* 198* 177* 126* 119*   Lipid Profile: No results for input(s): "CHOL", "HDL", "LDLCALC", "TRIG", "CHOLHDL", "LDLDIRECT" in the last 72 hours. Thyroid Function Tests: No results for input(s): "TSH", "T4TOTAL", "FREET4", "T3FREE", "THYROIDAB" in the last 72 hours. Anemia Panel: No results for input(s): "VITAMINB12", "FOLATE", "FERRITIN",  "TIBC", "IRON", "RETICCTPCT" in the last 72 hours. Sepsis Labs: No results for input(s): "PROCALCITON", "LATICACIDVEN" in the last 168 hours.  No results found for this or any previous visit (from the past 240 hour(s)).   Radiology Studies: DG CHEST PORT 1 VIEW  Result Date: 04/03/2022 CLINICAL DATA:  [id number].  Shortness of breath. EXAM: PORTABLE CHEST 1 VIEW COMPARISON:  Portable chest and CTA chest both 03/24/2022. FINDINGS: 4:54 a.m. Left chest dual lead pacing system and wire insertions are stable as well as CABG change. The heart is enlarged, also unchanged. The aorta is tortuous with moderate calcification, stable mediastinum. No vascular congestion is seen. There is bronchial thickening centrally consistent with bronchitis. Subtle increased patchy haziness left lower lung field concerning for pneumonia/pneumonitis. Remaining lungs are generally clear.  The sulci are sharp. Thoracic spondylosis and mild dextroscoliosis with osteopenia and chronic rotator cuff arthropathy. IMPRESSION: Central bronchitis which was seen previously, today with subtle increased patchy haziness in the left lower lung field concerning for pneumonia. Cardiomegaly. Clinical correlation and radiographic follow-up recommended. In all other respects no further changes. Electronically Signed   By: Telford Nab M.D.   On: 04/03/2022 07:13     Scheduled Meds:  amiodarone  200 mg Per Tube Daily   doxazosin  1 mg Per Tube Daily   famotidine  20 mg Per Tube BID   feeding supplement (PROSource TF20)  60 mL Per Tube Daily  ferrous gluconate  324 mg Oral QPM   fluconazole  100 mg Per Tube Daily   free water  150 mL Per Tube Q4H   insulin aspart  0-5 Units Subcutaneous QHS   insulin aspart  0-9 Units Subcutaneous Q4H   ipratropium-albuterol  3 mL Nebulization BID   lidocaine  1 patch Transdermal Q24H   mometasone-formoterol  2 puff Inhalation BID   nitrofurantoin  50 mg Per Tube QHS   nutrition supplement (JUVEN)  1  packet Per Tube BID BM   venlafaxine  37.5 mg Per Tube QHS   Zinc Oxide   Topical BID   Continuous Infusions:  feeding supplement (JEVITY 1.5 CAL/FIBER) 1,000 mL (04/04/22 1643)    LOS: 10 days   Raiford Noble, DO [facility] Available via Epic secure chat 7am-7pm After these hours, please refer to coverage provider listed on [email or web address] 04/04/2022, 4:48 PM

## 2022-04-04 NOTE — Progress Notes (Signed)
SLP Cancellation Note  Patient Details Name: [name] MRN: [id number] DOB: Sep 20, 1932   Cancelled treatment:       Reason Eval/Treat Not Completed: Other (comment)  Consulted with RN re: pt status. Per RN, pt continues inappropriate for MBS. Will continue to follow.  [name] B. Quentin Ore, [facility], [place] Speech Language Pathologist Office: [phone]  Shonna Chock 04/04/2022, 11:15 AM

## 2022-04-04 NOTE — Progress Notes (Signed)
4 Days Post-Op  Subjective: [name] denies voiding difficulty or recurrent hematuria but I will check with nursing..   ROS:  Review of Systems  Unable to perform ROS: Mental acuity    Anti-infectives: Anti-infectives (From admission, onward)    Start     Dose/Rate Route Frequency Ordered Stop   03/31/22 1000  linezolid (ZYVOX) tablet 600 mg  Status:  Discontinued        600 mg Per Tube Every 12 hours 03/31/22 0741 04/03/22 0917   03/30/22 1000  fluconazole (DIFLUCAN) tablet 100 mg       See Hyperspace for full Linked Orders Report.   100 mg Per Tube Daily 03/29/22 1517 04/12/22 0959   03/29/22 1615  fluconazole (DIFLUCAN) tablet 200 mg       See Hyperspace for full Linked Orders Report.   200 mg Per Tube  Once 03/29/22 1517 03/29/22 1705   03/25/22 1000  ceFEPIme (MAXIPIME) 2 g in sodium chloride 0.9 % 100 mL IVPB  Status:  Discontinued        2 g 200 mL/hr over 30 Minutes Intravenous Every 12 hours 03/24/22 2226 03/27/22 0908   03/25/22 1000  linezolid (ZYVOX) IVPB 600 mg  Status:  Discontinued        600 mg 300 mL/hr over 60 Minutes Intravenous Every 12 hours 03/25/22 0559 03/31/22 0741   03/24/22 2045  ceFEPIme (MAXIPIME) 2 g in sodium chloride 0.9 % 100 mL IVPB        2 g 200 mL/hr over 30 Minutes Intravenous  Once 03/24/22 2036 03/24/22 2200   03/24/22 2045  metroNIDAZOLE (FLAGYL) IVPB 500 mg        500 mg 100 mL/hr over 60 Minutes Intravenous  Once 03/24/22 2036 03/24/22 2329       Current Facility-Administered Medications  Medication Dose Route Frequency Provider Last Rate Last Admin   acetaminophen (TYLENOL) tablet 650 mg  650 mg Oral Q6H PRN Crosley, Debby, MD   650 mg at 04/03/22 1515   Or   acetaminophen (TYLENOL) suppository 650 mg  650 mg Rectal Q6H PRN Crosley, Debby, MD       amiodarone (PACERONE) tablet 200 mg  200 mg Per Tube Daily Crosley, Debby, MD   200 mg at 04/04/22 0804   Chlorhexidine Gluconate Cloth 2 % PADS 6 each  6 each Topical Daily [place] Eric  J, DO   6 each at 04/03/22 0958   doxazosin (CARDURA) tablet 1 mg  1 mg Per Tube Daily Crosley, Debby, MD   1 mg at 04/04/22 0804   famotidine (PEPCID) tablet 20 mg  20 mg Per Tube BID Crosley, Debby, MD   20 mg at 04/04/22 0804   feeding supplement (JEVITY 1.5 CAL/FIBER) liquid 1,000 mL  1,000 mL Per Tube Continuous Oswald Hillock, MD 90 mL/hr at 04/02/22 1630 1,000 mL at 04/02/22 1630   feeding supplement (PROSource TF20) liquid 60 mL  60 mL Per Tube Daily Oswald Hillock, MD   60 mL at 04/03/22 1232   ferrous gluconate (FERGON) tablet 324 mg  324 mg Oral QPM Crosley, Debby, MD   324 mg at 04/03/22 2027   fluconazole (DIFLUCAN) tablet 100 mg  100 mg Per Tube Daily [place] Eric J, DO   100 mg at 04/04/22 2993   free water 150 mL  150 mL Per Tube Q4H [place] Eric J, DO   150 mL at 04/04/22 0758   guaiFENesin (ROBITUSSIN) 100 MG/5ML liquid 15 mL  15  mL Per Tube Q4H PRN Lenis Noon, RPH   15 mL at 04/03/22 1630   insulin aspart (novoLOG) injection 0-5 Units  0-5 Units Subcutaneous QHS Crosley, Debby, MD       insulin aspart (novoLOG) injection 0-9 Units  0-9 Units Subcutaneous Q4H Crosley, Debby, MD   2 Units at 04/04/22 0752   ipratropium-albuterol (DUONEB) 0.5-2.5 (3) MG/3ML nebulizer solution 3 mL  3 mL Nebulization BID Sheikh, Omair Latif, DO       lidocaine (LIDODERM) 5 % 1 patch  1 patch Transdermal Q24H Sheikh, Omair Sheldon, DO   1 patch at 04/03/22 1233   mometasone-formoterol (DULERA) 200-5 MCG/ACT inhaler 2 puff  2 puff Inhalation BID Quintella Baton, MD   2 puff at 04/03/22 2006   nutrition supplement (JUVEN) (JUVEN) powder packet 1 packet  1 packet Per Tube BID BM Oswald Hillock, MD   1 packet at 04/04/22 0804   ondansetron (ZOFRAN) tablet 4 mg  4 mg Oral Q6H PRN Quintella Baton, MD       Or   ondansetron (ZOFRAN) injection 4 mg  4 mg Intravenous Q6H PRN Claria Dice, Debby, MD   4 mg at 04/03/22 1044   oxyCODONE (ROXICODONE) 5 MG/5ML solution 5 mg  5 mg Per Tube Q6H PRN [place] Eric J, DO        senna-docusate (Senokot-S) tablet 1 tablet  1 tablet Oral QHS PRN Quintella Baton, MD       venlafaxine (EFFEXOR) tablet 37.5 mg  37.5 mg Per Tube QHS [place] Eric J, DO   37.5 mg at 04/03/22 2027   Zinc Oxide (TRIPLE PASTE) 12.8 % ointment   Topical BID Quintella Baton, MD   Given at 04/04/22 0804     Objective: Vital signs in last 24 hours: Temp:  [97.6 F (36.4 C)-98.1 F (36.7 C)] 98.1 F (36.7 C) (08/11 0414) Pulse Rate:  [78-87] 78 (08/11 0414) Resp:  [18-21] 19 (08/11 0414) BP: (92-160)/(49-67) 129/54 (08/11 0414) SpO2:  [94 %-99 %] 98 % (08/11 0414) Weight:  [78.6 kg] 78.6 kg (08/11 0435)  Intake/Output from previous day: 08/10 0701 - 08/11 0700 In: 200  Out: 1175 [Urine:1175] Intake/Output this shift: No intake/output data recorded.   Physical Exam Vitals reviewed.  Constitutional:      Appearance: Normal appearance.     Lab Results:  Recent Labs    04/03/22 0456 04/04/22 0537  WBC 3.9* 4.1  HGB 7.6* 7.8*  HCT 25.3* 25.8*  PLT 82* 80*    BMET Recent Labs    04/03/22 0456 04/04/22 0537  NA 133* 136  K 4.4 4.5  CL 103 102  CO2 25 29  GLUCOSE 197* 203*  BUN 33* 32*  CREATININE 0.69 0.65  CALCIUM 9.0 9.1    PT/INR No results for input(s): "LABPROT", "INR" in the last 72 hours. ABG No results for input(s): "PHART", "HCO3" in the last 72 hours.  Invalid input(s): "PCO2", "PO2"  Studies/Results: DG CHEST PORT 1 VIEW  Result Date: 04/03/2022 CLINICAL DATA:  [id number].  Shortness of breath. EXAM: PORTABLE CHEST 1 VIEW COMPARISON:  Portable chest and CTA chest both 03/24/2022. FINDINGS: 4:54 a.m. Left chest dual lead pacing system and wire insertions are stable as well as CABG change. The heart is enlarged, also unchanged. The aorta is tortuous with moderate calcification, stable mediastinum. No vascular congestion is seen. There is bronchial thickening centrally consistent with bronchitis. Subtle increased patchy haziness left lower lung field  concerning for pneumonia/pneumonitis. Remaining lungs are  generally clear.  The sulci are sharp. Thoracic spondylosis and mild dextroscoliosis with osteopenia and chronic rotator cuff arthropathy. IMPRESSION: Central bronchitis which was seen previously, today with subtle increased patchy haziness in the left lower lung field concerning for pneumonia. Cardiomegaly. Clinical correlation and radiographic follow-up recommended. In all other respects no further changes. Electronically Signed   By: Telford Nab M.D.   On: 04/03/2022 07:13   CT AP 03/24/22. IMPRESSION: 1. Negative for acute pulmonary embolism. 2. Bronchial wall thickening, mucous plugging and scattered nodular opacities greatest in the dependent lungs. Findings are compatible with bronchitis/bronchiolitis with a distribution suspicious for aspiration. 3. Irregular bladder wall thickening. This may be due to bladder decompression however cystitis is not excluded. Recommend correlation with urinalysis. 4. Aortic and coronary artery atherosclerotic calcification. 5. 2.1 cm left common iliac artery aneurysm. 6. Healing fractures of the right superior and inferior pubic rami right sacral ala posterior right iliac bone.     Electronically Signed   By: Placido Sou M.D.   On: 03/24/2022 23:07    Assessment and Plan: Gross hematuria with VRE:   The hematuria has resolved.  ID recommends cessation of Linezolid.  Hopefully he will not have recurrent bleeding now that he had fulguration of the hemorrhagic area.   Urinary retention.  He is voiding well after foley removal.         LOS: 10 days    Irine Seal 04/04/2022 [phone] Patient ID: [name], male   DOB: 21-Dec-1932, 88 y.o.   MRN: [id number] Patient ID: [name], male   DOB: [birth year], 88 y.o.   MRN: [id number] Patient ID: [name], male   DOB: 03-25-1933, 88 y.o.   MRN: [id number] Patient ID: [name], male   DOB: 1932/11/27, 88 y.o.   MRN: [id number]

## 2022-04-05 ENCOUNTER — Inpatient Hospital Stay (HOSPITAL_COMMUNITY): Payer: Medicare Other

## 2022-04-05 DIAGNOSIS — J9601 Acute respiratory failure with hypoxia: Secondary | ICD-10-CM | POA: Diagnosis not present

## 2022-04-05 DIAGNOSIS — N3001 Acute cystitis with hematuria: Secondary | ICD-10-CM | POA: Diagnosis not present

## 2022-04-05 DIAGNOSIS — N179 Acute kidney failure, unspecified: Secondary | ICD-10-CM | POA: Diagnosis not present

## 2022-04-05 DIAGNOSIS — D649 Anemia, unspecified: Secondary | ICD-10-CM | POA: Diagnosis not present

## 2022-04-05 LAB — CBC WITH DIFFERENTIAL/PLATELET
Abs Immature Granulocytes: 0.03 10*3/uL (ref 0.00–0.07)
Basophils Absolute: 0 10*3/uL (ref 0.0–0.1)
Basophils Relative: 0 %
Eosinophils Absolute: 0 10*3/uL (ref 0.0–0.5)
Eosinophils Relative: 0 %
HCT: 26.6 % — ABNORMAL LOW (ref 39.0–52.0)
Hemoglobin: 7.8 g/dL — ABNORMAL LOW (ref 13.0–17.0)
Immature Granulocytes: 1 %
Lymphocytes Relative: 39 %
Lymphs Abs: 1.9 10*3/uL (ref 0.7–4.0)
MCH: 29.7 pg (ref 26.0–34.0)
MCHC: 29.3 g/dL — ABNORMAL LOW (ref 30.0–36.0)
MCV: 101.1 fL — ABNORMAL HIGH (ref 80.0–100.0)
Monocytes Absolute: 0.5 10*3/uL (ref 0.1–1.0)
Monocytes Relative: 9 %
Neutro Abs: 2.5 10*3/uL (ref 1.7–7.7)
Neutrophils Relative %: 51 %
Platelets: 83 10*3/uL — ABNORMAL LOW (ref 150–400)
RBC: 2.63 MIL/uL — ABNORMAL LOW (ref 4.22–5.81)
RDW: 19.8 % — ABNORMAL HIGH (ref 11.5–15.5)
WBC: 4.8 10*3/uL (ref 4.0–10.5)
nRBC: 0 % (ref 0.0–0.2)

## 2022-04-05 LAB — MAGNESIUM: Magnesium: 1.9 mg/dL (ref 1.7–2.4)

## 2022-04-05 LAB — GLUCOSE, CAPILLARY
Glucose-Capillary: 103 mg/dL — ABNORMAL HIGH (ref 70–99)
Glucose-Capillary: 109 mg/dL — ABNORMAL HIGH (ref 70–99)
Glucose-Capillary: 121 mg/dL — ABNORMAL HIGH (ref 70–99)
Glucose-Capillary: 159 mg/dL — ABNORMAL HIGH (ref 70–99)
Glucose-Capillary: 177 mg/dL — ABNORMAL HIGH (ref 70–99)
Glucose-Capillary: 241 mg/dL — ABNORMAL HIGH (ref 70–99)
Glucose-Capillary: 265 mg/dL — ABNORMAL HIGH (ref 70–99)

## 2022-04-05 LAB — COMPREHENSIVE METABOLIC PANEL
ALT: 23 U/L (ref 0–44)
AST: 23 U/L (ref 15–41)
Albumin: 2.3 g/dL — ABNORMAL LOW (ref 3.5–5.0)
Alkaline Phosphatase: 79 U/L (ref 38–126)
Anion gap: 7 (ref 5–15)
BUN: 37 mg/dL — ABNORMAL HIGH (ref 8–23)
CO2: 27 mmol/L (ref 22–32)
Calcium: 9.4 mg/dL (ref 8.9–10.3)
Chloride: 103 mmol/L (ref 98–111)
Creatinine, Ser: 0.48 mg/dL — ABNORMAL LOW (ref 0.61–1.24)
GFR, Estimated: >60 mL/min (ref >60)
Glucose, Bld: 171 mg/dL — ABNORMAL HIGH (ref 70–99)
Potassium: 4.5 mmol/L (ref 3.5–5.1)
Sodium: 137 mmol/L (ref 135–145)
Total Bilirubin: 0.4 mg/dL (ref 0.3–1.2)
Total Protein: 5.1 g/dL — ABNORMAL LOW (ref 6.5–8.1)

## 2022-04-05 LAB — PHOSPHORUS: Phosphorus: 2 mg/dL — ABNORMAL LOW (ref 2.5–4.6)

## 2022-04-05 MED ORDER — APIXABAN 5 MG PO TABS
5.0000 mg | ORAL_TABLET | Freq: Two times a day (BID) | ORAL | Status: DC
Start: 2022-04-05 — End: 2022-04-07
  Administered 2022-04-05 – 2022-04-06 (×2): 5 mg
  Filled 2022-04-05 (×2): qty 1

## 2022-04-05 MED ORDER — JEVITY 1.5 CAL/FIBER PO LIQD
1350.0000 mL | ORAL | Status: DC
  Filled 2022-04-05: qty 2000

## 2022-04-05 MED ORDER — POTASSIUM & SODIUM PHOSPHATES 280-160-250 MG PO PACK
1.0000 | PACK | Freq: Three times a day (TID) | ORAL | Status: DC
  Administered 2022-04-05 – 2022-04-06 (×5): 1
  Filled 2022-04-05 (×8): qty 1

## 2022-04-05 NOTE — Progress Notes (Addendum)
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 Cognitive deficits;Time post onset;Severity of deficits   04/05/2022   3:01 PM Diet Recommendations SLP Diet Recommendations Thin liquid;Nectar thick liquid Liquid Administration via Cup;Spoon;Straw Medication Administration Via alternative means Compensations Slow rate;Small sips/bites Postural Changes Seated upright at 90 degrees;Remain semi-upright after after feeds/meals (Comment)     04/05/2022   3:01 PM Other Recommendations Oral Care Recommendations Oral care BID;Other (Comment) Follow Up Recommendations Follow physician's recommendations for discharge plan and follow up therapies Functional Status Assessment Patient has had a recent decline in their functional status and demonstrates the ability to make significant improvements in function in a reasonable and predictable amount of time.   04/05/2022   3:01 PM Frequency and Duration  Speech Therapy Frequency (ACUTE ONLY) min 2x/week Treatment Duration 2 weeks  04/05/2022   2:49 PM Oral Phase Oral Phase Impaired Oral - Nectar Teaspoon Delayed oral transit;Weak lingual manipulation;Lingual/palatal residue;Premature spillage;Decreased bolus cohesion Oral - Nectar Cup Right anterior bolus loss;Left anterior bolus loss;Weak lingual manipulation;Decreased bolus cohesion;Premature spillage;Delayed oral transit Oral - Nectar  Straw Weak lingual manipulation;Reduced posterior propulsion;Delayed oral transit;Decreased bolus cohesion;Premature spillage;Lingual pumping Oral - Thin Teaspoon Weak lingual manipulation;Reduced posterior propulsion;Delayed oral transit;Decreased bolus cohesion;Premature spillage Oral - Thin Cup Decreased bolus cohesion;Delayed oral transit;Premature spillage;Lingual pumping;Weak lingual manipulation;Reduced posterior propulsion Oral - Thin Straw Weak lingual manipulation;Reduced posterior propulsion;Lingual pumping Oral - Puree Weak lingual manipulation;Holding of bolus Oral - Mech Soft Holding of bolus;Impaired mastication;Other (Comment);Weak lingual manipulation Oral Phase - Comment Pt with impaired attention to task and weak lingual propulsion without oral transit of solids/purees; Anterior- oral cavity mastication vertical pattern noted - again without oral transiting; Daughter states pt does not like applesauce- however results of testing is not only due to gusatory displeasure.  Pt also given plain pudding bolus without swallow attempt despite cueing from SLP and his daughter.  Pt demonstrated mild dysarthria and impaired oral coordination/transiting across all boluses including liquids.    04/05/2022   2:57 PM Pharyngeal Phase Pharyngeal Phase Impaired Pharyngeal- Nectar Teaspoon Delayed swallow initiation-vallecula Pharyngeal Material does not enter airway Pharyngeal- Nectar Cup NT Pharyngeal Material does not enter airway Pharyngeal- Nectar Straw Delayed swallow initiation-vallecula Pharyngeal Material does not enter airway Pharyngeal- Thin Teaspoon Delayed swallow initiation-vallecula Pharyngeal Material does not enter airway Pharyngeal- Thin Cup NT Pharyngeal Material does not enter airway Pharyngeal- Thin Straw Delayed swallow initiation-pyriform sinuses Pharyngeal Material does not enter airway Pharyngeal- Puree NT Pharyngeal- Mechanical Soft NT    04/05/2022   3:01 PM Cervical Esophageal Phase   Cervical Esophageal Phase WFL Kathleen Lime, MS [facility] SLP Acute Rehab Services Office [phone] Pager [phone] Macario Golds 04/05/2022, 3:15 PM                     DG CHEST PORT 1 VIEW  Result Date: 04/05/2022 CLINICAL DATA:  Nausea and vomiting.  Abdominal swelling. EXAM: PORTABLE CHEST 1 VIEW COMPARISON:  April 03, 2022 FINDINGS: Stable pacemaker. Stable mild cardiomegaly. The hila and mediastinum are unchanged. No pneumothorax. The left lung is clear. Mild haziness in the right base is likely atelectasis. No overt edema. IMPRESSION: Probable mild atelectasis in the right base. No other acute abnormalities. Electronically Signed   By: Dorise Bullion III M.D.   On: 04/05/2022 11:15    Scheduled Meds:  amiodarone  200 mg Per Tube Daily   apixaban  5 mg Per Tube BID   doxazosin  1 mg Per Tube Daily   famotidine  20 mg Per Tube BID   [START ON 04/06/2022] feeding supplement (JEVITY 1.5 CAL/FIBER)  1,350 mL Per Tube Q24H   feeding supplement (PROSource TF20)  60 mL Per Tube Daily   ferrous gluconate  324 mg Oral QPM   fluconazole  100 mg Per Tube Daily   free water  150 mL Per Tube Q4H   insulin aspart  0-5 Units Subcutaneous QHS   insulin aspart  0-9 Units Subcutaneous Q4H   ipratropium-albuterol  3 mL Nebulization BID   lidocaine  1 patch Transdermal Q24H   mometasone-formoterol  2 puff Inhalation BID   nitrofurantoin  50 mg Per Tube QHS   nutrition supplement (JUVEN)  1 packet Per Tube BID BM   potassium & sodium phosphates  1 packet Per Tube TID WC & HS  venlafaxine  37.5 mg Per Tube QHS   Zinc Oxide   Topical BID   Continuous Infusions:    LOS: 11 days   Raiford Noble, DO [facility] Available via Epic secure chat 7am-7pm After these hours, please refer to coverage provider listed on [email or web address] 04/05/2022, 3:50 PM

## 2022-04-05 NOTE — TOC Progression Note (Signed)
Transition of Care [facility]) - Progression Note    Patient Details  Name: [name] MRN: [id number] Date of Birth: Feb 09, 1933  [facility] CM/SW Contact  [name], [name] Phone Number: 04/05/2022, 5:16 PM  Clinical Narrative:     CSW was informed that patient will need a hoyer lift and nebulizer.  CSW spoke to [name] at [name], and they can deliver equipment for him.  CSW spoke to patient's daughter [name], [phone] to discuss home health and equipment needs.  CSW spoke to [name] at [name], she agreed to accept patient back since they just had him in May.  CSW updated patient's daughter who is aware that [name] has been set up.  Per patient's daughter he will need EMS transport back home to [place] [id number].  CSW to continue to follow patient's progress throughout discharge planning.   Expected Discharge Plan:  (TBD)    Expected Discharge Plan and Services Expected Discharge Plan:  (TBD)   Discharge Planning Services: CM Consult   Living arrangements for the past 2 months: [place]                   DME Agency: NA                   Social Determinants of Health (SDOH) Interventions    Readmission Risk Interventions    01/16/2022    2:12 PM  Readmission Risk Prevention Plan  Transportation Screening Complete  Medication Review (RN Care Manager) Referral to Pharmacy  PCP or Specialist appointment within 3-5 days of discharge Complete  HRI or [place] Complete  SW Recovery Care/Counseling Consult Complete  [place] Not Applicable

## 2022-04-06 DIAGNOSIS — J9601 Acute respiratory failure with hypoxia: Secondary | ICD-10-CM | POA: Diagnosis not present

## 2022-04-06 DIAGNOSIS — N3001 Acute cystitis with hematuria: Secondary | ICD-10-CM | POA: Diagnosis not present

## 2022-04-06 DIAGNOSIS — D649 Anemia, unspecified: Secondary | ICD-10-CM | POA: Diagnosis not present

## 2022-04-06 DIAGNOSIS — N179 Acute kidney failure, unspecified: Secondary | ICD-10-CM | POA: Diagnosis not present

## 2022-04-06 LAB — CBC WITH DIFFERENTIAL/PLATELET
Abs Immature Granulocytes: 0.03 10*3/uL (ref 0.00–0.07)
Basophils Absolute: 0 10*3/uL (ref 0.0–0.1)
Basophils Relative: 0 %
Eosinophils Absolute: 0 10*3/uL (ref 0.0–0.5)
Eosinophils Relative: 0 %
HCT: 27.1 % — ABNORMAL LOW (ref 39.0–52.0)
Hemoglobin: 8.1 g/dL — ABNORMAL LOW (ref 13.0–17.0)
Immature Granulocytes: 1 %
Lymphocytes Relative: 26 %
Lymphs Abs: 1.5 10*3/uL (ref 0.7–4.0)
MCH: 29.8 pg (ref 26.0–34.0)
MCHC: 29.9 g/dL — ABNORMAL LOW (ref 30.0–36.0)
MCV: 99.6 fL (ref 80.0–100.0)
Monocytes Absolute: 1 10*3/uL (ref 0.1–1.0)
Monocytes Relative: 18 %
Neutro Abs: 3.1 10*3/uL (ref 1.7–7.7)
Neutrophils Relative %: 55 %
Platelets: 84 10*3/uL — ABNORMAL LOW (ref 150–400)
RBC: 2.72 MIL/uL — ABNORMAL LOW (ref 4.22–5.81)
RDW: 20.1 % — ABNORMAL HIGH (ref 11.5–15.5)
WBC: 5.6 10*3/uL (ref 4.0–10.5)
nRBC: 0 % (ref 0.0–0.2)

## 2022-04-06 LAB — COMPREHENSIVE METABOLIC PANEL
ALT: 25 U/L (ref 0–44)
AST: 25 U/L (ref 15–41)
Albumin: 2.5 g/dL — ABNORMAL LOW (ref 3.5–5.0)
Alkaline Phosphatase: 81 U/L (ref 38–126)
Anion gap: 6 (ref 5–15)
BUN: 38 mg/dL — ABNORMAL HIGH (ref 8–23)
CO2: 26 mmol/L (ref 22–32)
Calcium: 9.5 mg/dL (ref 8.9–10.3)
Chloride: 104 mmol/L (ref 98–111)
Creatinine, Ser: 0.62 mg/dL (ref 0.61–1.24)
GFR, Estimated: >60 mL/min (ref >60)
Glucose, Bld: 227 mg/dL — ABNORMAL HIGH (ref 70–99)
Potassium: 4.2 mmol/L (ref 3.5–5.1)
Sodium: 136 mmol/L (ref 135–145)
Total Bilirubin: 0.5 mg/dL (ref 0.3–1.2)
Total Protein: 5.1 g/dL — ABNORMAL LOW (ref 6.5–8.1)

## 2022-04-06 LAB — PHOSPHORUS: Phosphorus: 2.4 mg/dL — ABNORMAL LOW (ref 2.5–4.6)

## 2022-04-06 LAB — GLUCOSE, CAPILLARY
Glucose-Capillary: 107 mg/dL — ABNORMAL HIGH (ref 70–99)
Glucose-Capillary: 120 mg/dL — ABNORMAL HIGH (ref 70–99)
Glucose-Capillary: 128 mg/dL — ABNORMAL HIGH (ref 70–99)
Glucose-Capillary: 197 mg/dL — ABNORMAL HIGH (ref 70–99)
Glucose-Capillary: 212 mg/dL — ABNORMAL HIGH (ref 70–99)

## 2022-04-06 LAB — MAGNESIUM: Magnesium: 1.9 mg/dL (ref 1.7–2.4)

## 2022-04-06 MED ORDER — IPRATROPIUM-ALBUTEROL 0.5-2.5 (3) MG/3ML IN SOLN: 3.0000 mL | Freq: Two times a day (BID) | RESPIRATORY_TRACT | 0 refills | Status: DC

## 2022-04-06 MED ORDER — NITROFURANTOIN MACROCRYSTAL 50 MG PO CAPS
50.0000 mg | ORAL_CAPSULE | Freq: Every day | ORAL | 0 refills | Status: DC
Start: 2022-04-06 — End: 2022-06-30

## 2022-04-06 MED ORDER — ZINC OXIDE 12.8 % EX OINT: TOPICAL_OINTMENT | Freq: Two times a day (BID) | CUTANEOUS | 0 refills | Status: DC

## 2022-04-06 MED ORDER — FLUCONAZOLE 100 MG PO TABS
100.0000 mg | ORAL_TABLET | Freq: Every day | ORAL | 0 refills | Status: AC
Start: 2022-04-07 — End: 2022-04-12

## 2022-04-06 MED ORDER — JEVITY 1.5 CAL/FIBER PO LIQD: 1350.0000 mL | ORAL | Status: DC

## 2022-04-06 MED ORDER — POTASSIUM & SODIUM PHOSPHATES 280-160-250 MG PO PACK: 1.0000 | PACK | Freq: Three times a day (TID) | ORAL | 0 refills | Status: DC

## 2022-04-06 NOTE — Discharge Summary (Signed)
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 atelectasis. No overt edema. IMPRESSION: Probable mild atelectasis in the right base. No other acute abnormalities. Electronically Signed   By: Dorise Bullion III M.D.   On: 04/05/2022 11:15   DG CHEST PORT 1 VIEW  Result Date: 04/03/2022 CLINICAL DATA:  [id number].  Shortness of breath. EXAM: PORTABLE CHEST 1 VIEW COMPARISON:  Portable chest and CTA chest both 03/24/2022. FINDINGS: 4:54 a.m. Left chest dual lead pacing system and wire insertions are stable as well as CABG change. The heart is enlarged, also unchanged. The aorta is tortuous with moderate calcification, stable mediastinum. No vascular congestion is seen. There is bronchial thickening centrally consistent with bronchitis. Subtle increased patchy haziness left lower lung field concerning for pneumonia/pneumonitis. Remaining lungs are generally clear.  The sulci are sharp. Thoracic spondylosis  and mild dextroscoliosis with osteopenia and chronic rotator cuff arthropathy. IMPRESSION: Central bronchitis which was seen previously, today with subtle increased patchy  haziness in the left lower lung field concerning for pneumonia. Cardiomegaly. Clinical correlation and radiographic follow-up recommended. In all other respects no further changes. Electronically Signed   By: Telford Nab M.D.   On: 04/03/2022 07:13   CT ABDOMEN PELVIS W CONTRAST  Result Date: 03/24/2022 CLINICAL DATA:  Concern for PE; nausea and vomiting; gross hematuria EXAM: CT ANGIOGRAPHY CHEST; CT ABDOMEN AND PELVIS WITH CONTRAST TECHNIQUE: Multidetector CT imaging of the chest was performed using the standard protocol during bolus administration of intravenous contrast. Multiplanar CT image reconstructions and MIPs were obtained to evaluate the vascular anatomy. Multidetector CT imaging of the abdomen and pelvis was performed using the standard protocol during bolus administration of intravenous contrast. RADIATION DOSE REDUCTION: This exam was performed according to the departmental dose-optimization program which includes automated exposure control, adjustment of the mA and/or kV according to patient size and/or use of iterative reconstruction technique. CONTRAST:  117m OMNIPAQUE IOHEXOL 350 MG/ML SOLN COMPARISON:  CT abdomen and pelvis 01/24/2022 FINDINGS: CTA CHEST FINDINGS Cardiovascular: Satisfactory opacification of the pulmonary arteries to the segmental level. No evidence of pulmonary embolism. Mild cardiomegaly. No pericardial effusion. Left chest wall pacemaker. CABG. Aortic and coronary artery atherosclerosis. Mediastinum/Nodes: No enlarged mediastinal, hilar, or axillary lymph nodes. Thyroid gland, trachea, and esophagus demonstrate no significant findings. Lungs/Pleura: Diffuse bronchial wall thickening. Scattered mucous plugging greatest in the lower lobes. Clustered nodular opacities greatest in the dependent portions of the upper lobes and in the bilateral lower lobes. Musculoskeletal: Subacute or chronic right rib fractures. Degenerative arthritis both shoulders. Thoracic spondylosis.  Review of the MIP images confirms the above findings. CT ABDOMEN and PELVIS FINDINGS Hepatobiliary: No focal liver abnormality is seen. Status post cholecystectomy. No biliary dilatation. Pancreas: Unremarkable. No pancreatic ductal dilatation or surrounding inflammatory changes. Spleen: Spleen at the upper limits of normal in size. Unchanged cyst or hemangioma in the superior portion of the spleen. Adrenals/Urinary Tract: Adrenals are unremarkable. No urinary calculi or hydronephrosis. Multiple large bilateral renal cysts measuring up to 7.1 cm in the right upper pole. A few cysts are too small to adequately characterize. Cortical scarring left kidney. Foley catheter in the decompressed bladder. Gas within the bladder lumen is presumed related to Foley catheter. Irregular bladder wall thickening. Stomach/Bowel: Gastrostomy tube. Decreased stranding and subcutaneous gas at the site of the gastrostomy since 01/24/2022. Normal caliber large and small bowel. Normal appendix. No bowel wall thickening or adjacent inflammatory change. Vascular/Lymphatic: Aortic atherosclerotic calcification. Redemonstrated low-density in the infrarenal abdominal aorta residual change from aortobifemoral bypass graft. The graft is patent. Unchanged 2.1 cm left common iliac artery aneurysm. Reproductive: Unremarkable. Other: No free intraperitoneal fluid or air. Bilateral fat containing inguinal hernias. Musculoskeletal: Healing fractures in the right sacral ala and posterior right iliac bone. Additional healing fractures of the right superior and inferior pubic rami. No acute osseous abnormality. Review of the MIP images confirms the above findings. IMPRESSION: 1. Negative for acute pulmonary embolism. 2. Bronchial wall thickening, mucous plugging and scattered nodular opacities greatest in the dependent lungs. Findings are compatible with bronchitis/bronchiolitis with a distribution suspicious for aspiration. 3. Irregular bladder wall  thickening. This may be due to bladder decompression however cystitis is not excluded. Recommend correlation with urinalysis. 4. Aortic and coronary artery atherosclerotic calcification. 5. 2.1 cm left common iliac artery aneurysm. 6. Healing fractures of the right superior and inferior  pubic rami right sacral ala posterior right iliac bone. Electronically Signed   By: Placido Sou M.D.   On: 03/24/2022 23:07   CT Angio Chest PE W and/or Wo Contrast  Result Date: 03/24/2022 CLINICAL DATA:  Concern for PE; nausea and vomiting; gross hematuria EXAM: CT ANGIOGRAPHY CHEST; CT ABDOMEN AND PELVIS WITH CONTRAST TECHNIQUE: Multidetector CT imaging of the chest was performed using the standard protocol during bolus administration of intravenous contrast. Multiplanar CT image reconstructions and MIPs were obtained to evaluate the vascular anatomy. Multidetector CT imaging of the abdomen and pelvis was performed using the standard protocol during bolus administration of intravenous contrast. RADIATION DOSE REDUCTION: This exam was performed according to the departmental dose-optimization program which includes automated exposure control, adjustment of the mA and/or kV according to patient size and/or use of iterative reconstruction technique. CONTRAST:  142mL OMNIPAQUE IOHEXOL 350 MG/ML SOLN COMPARISON:  CT abdomen and pelvis 01/24/2022 FINDINGS: CTA CHEST FINDINGS Cardiovascular: Satisfactory opacification of the pulmonary arteries to the segmental level. No evidence of pulmonary embolism. Mild cardiomegaly. No pericardial effusion. Left chest wall pacemaker. CABG. Aortic and coronary artery atherosclerosis. Mediastinum/Nodes: No enlarged mediastinal, hilar, or axillary lymph nodes. Thyroid gland, trachea, and esophagus demonstrate no significant findings. Lungs/Pleura: Diffuse bronchial wall thickening. Scattered mucous plugging greatest in the lower lobes. Clustered nodular opacities greatest in the dependent  portions of the upper lobes and in the bilateral lower lobes. Musculoskeletal: Subacute or chronic right rib fractures. Degenerative arthritis both shoulders. Thoracic spondylosis. Review of the MIP images confirms the above findings. CT ABDOMEN and PELVIS FINDINGS Hepatobiliary: No focal liver abnormality is seen. Status post cholecystectomy. No biliary dilatation. Pancreas: Unremarkable. No pancreatic ductal dilatation or surrounding inflammatory changes. Spleen: Spleen at the upper limits of normal in size. Unchanged cyst or hemangioma in the superior portion of the spleen. Adrenals/Urinary Tract: Adrenals are unremarkable. No urinary calculi or hydronephrosis. Multiple large bilateral renal cysts measuring up to 7.1 cm in the right upper pole. A few cysts are too small to adequately characterize. Cortical scarring left kidney. Foley catheter in the decompressed bladder. Gas within the bladder lumen is presumed related to Foley catheter. Irregular bladder wall thickening. Stomach/Bowel: Gastrostomy tube. Decreased stranding and subcutaneous gas at the site of the gastrostomy since 01/24/2022. Normal caliber large and small bowel. Normal appendix. No bowel wall thickening or adjacent inflammatory change. Vascular/Lymphatic: Aortic atherosclerotic calcification. Redemonstrated low-density in the infrarenal abdominal aorta residual change from aortobifemoral bypass graft. The graft is patent. Unchanged 2.1 cm left common iliac artery aneurysm. Reproductive: Unremarkable. Other: No free intraperitoneal fluid or air. Bilateral fat containing inguinal hernias. Musculoskeletal: Healing fractures in the right sacral ala and posterior right iliac bone. Additional healing fractures of the right superior and inferior pubic rami. No acute osseous abnormality. Review of the MIP images confirms the above findings. IMPRESSION: 1. Negative for acute pulmonary embolism. 2. Bronchial wall thickening, mucous plugging and scattered  nodular opacities greatest in the dependent lungs. Findings are compatible with bronchitis/bronchiolitis with a distribution suspicious for aspiration. 3. Irregular bladder wall thickening. This may be due to bladder decompression however cystitis is not excluded. Recommend correlation with urinalysis. 4. Aortic and coronary artery atherosclerotic calcification. 5. 2.1 cm left common iliac artery aneurysm. 6. Healing fractures of the right superior and inferior pubic rami right sacral ala posterior right iliac bone. Electronically Signed   By: Placido Sou M.D.   On: 03/24/2022 23:07   DG Chest Port 1 View  Result Date: 03/24/2022 CLINICAL  DATA:  Sepsis. EXAM: PORTABLE CHEST 1 VIEW COMPARISON:  January 24, 2022. FINDINGS: Stable cardiomediastinal silhouette. Status post coronary bypass graft. Left-sided pacemaker is unchanged in position. No acute pulmonary disease is noted. Bony thorax is unremarkable. IMPRESSION: No active disease. Electronically Signed   By: Marijo Conception M.D.   On: 03/24/2022 18:38   CUP PACEART REMOTE DEVICE CHECK  Result Date: 03/14/2022 Scheduled remote reviewed. Normal device function.  Next remote 91 days. [place]    The results of significant diagnostics from this hospitalization (including imaging, microbiology, ancillary and laboratory) are listed below for reference.     Microbiology: No results found for this or any previous visit (from the past 240 hour(s)).   Labs: BNP (last 3 results) No results for input(s): "BNP" in the last 8760 hours. Basic Metabolic Panel: Recent Labs  Lab 04/02/22 0453 04/03/22 0456 04/04/22 0537 04/05/22 0446 04/06/22 0531  NA 135 133* 136 137 136  K 4.6 4.4 4.5 4.5 4.2  CL 103 103 102 103 104  CO2 _0 GLUCOSE 146* 197* 203* 171* 227*  BUN 35* 33* 32* 37* 38*  CREATININE 0.71 0.69 0.65 0.48* 0.62  CALCIUM 8.8* 9.0 9.1 9.4 9.5  MG 2.0 1.8 2.2 1.9 1.9  PHOS 2.9 2.8 2.7 2.0* 2.4*   Liver Function Tests: Recent Labs   Lab 04/02/22 0453 04/03/22 0456 04/04/22 0537 04/05/22 0446 04/06/22 0531  AST _1 ALT _2 ALKPHOS 79 76 81 79 81  BILITOT 0.4 0.4 0.5 0.4 0.5  PROT 4.7* 4.8* 5.1* 5.1* 5.1*  ALBUMIN 2.3* 2.3* 2.4* 2.3* 2.5*   No results for input(s): "LIPASE", "AMYLASE" in the last 168 hours. No results for input(s): "AMMONIA" in the last 168 hours. CBC: Recent Labs  Lab 04/02/22 0453 04/03/22 0456 04/04/22 0537 04/05/22 0446 04/06/22 0531  WBC 3.9* 3.9* 4.1 4.8 5.6  NEUTROABS  --  2.1 2.4 2.5 3.1  HGB 7.7* 7.6* 7.8* 7.8* 8.1*  HCT 25.4* 25.3* 25.8* 26.6* 27.1*  MCV 98.1 98.1 97.7 101.1* 99.6  PLT 82* 82* 80* 83* 84*   Cardiac Enzymes: No results for input(s): "CKTOTAL", "CKMB", "CKMBINDEX", "TROPONINI" in the last 168 hours. BNP: Invalid input(s): "POCBNP" CBG: Recent Labs  Lab 04/05/22 2311 04/06/22 0010 04/06/22 0403 04/06/22 0732 04/06/22 1214  GLUCAP 121* 120* 212* 197* 107*   D-Dimer No results for input(s): "DDIMER" in the last 72 hours. Hgb A1c No results for input(s): "HGBA1C" in the last 72 hours. Lipid Profile No results for input(s): "CHOL", "HDL", "LDLCALC", "TRIG", "CHOLHDL", "LDLDIRECT" in the last 72 hours. Thyroid function studies No results for input(s): "TSH", "T4TOTAL", "T3FREE", "THYROIDAB" in the last 72 hours.  Invalid input(s): "FREET3" Anemia work up No results for input(s): "VITAMINB12", "FOLATE", "FERRITIN", "TIBC", "IRON", "RETICCTPCT" in the last 72 hours. Urinalysis    Component Value Date/Time   COLORURINE RED (A) 03/24/2022 1927   APPEARANCEUR TURBID (A) 03/24/2022 1927   APPEARANCEUR Cloudy (A) 03/18/2022 1359   LABSPEC  03/24/2022 1927    TEST NOT REPORTED DUE TO COLOR INTERFERENCE OF URINE PIGMENT   PHURINE  03/24/2022 1927    TEST NOT REPORTED DUE TO COLOR INTERFERENCE OF URINE PIGMENT   GLUCOSEU (A) 03/24/2022 1927    TEST NOT REPORTED DUE TO COLOR INTERFERENCE OF URINE PIGMENT   HGBUR (A) 03/24/2022  1927    TEST NOT REPORTED DUE TO COLOR INTERFERENCE OF URINE PIGMENT   BILIRUBINUR (  A) 03/24/2022 1927    TEST NOT REPORTED DUE TO COLOR INTERFERENCE OF URINE PIGMENT   BILIRUBINUR Negative 03/18/2022 1359   KETONESUR (A) 03/24/2022 1927    TEST NOT REPORTED DUE TO COLOR INTERFERENCE OF URINE PIGMENT   PROTEINUR (A) 03/24/2022 1927    TEST NOT REPORTED DUE TO COLOR INTERFERENCE OF URINE PIGMENT   UROBILINOGEN 1.0 03/28/2014 1208   NITRITE (A) 03/24/2022 1927    TEST NOT REPORTED DUE TO COLOR INTERFERENCE OF URINE PIGMENT   LEUKOCYTESUR (A) 03/24/2022 1927    TEST NOT REPORTED DUE TO COLOR INTERFERENCE OF URINE PIGMENT   Sepsis Labs Recent Labs  Lab 04/03/22 0456 04/04/22 0537 04/05/22 0446 04/06/22 0531  WBC 3.9* 4.1 4.8 5.6   Microbiology No results found for this or any previous visit (from the past 240 hour(s)).   Time coordinating discharge:  I have spent 35 minutes face to face with the patient and on the ward discussing the patients care, assessment, plan and disposition with other care givers. >50% of the time was devoted counseling the patient about the risks and benefits of treatment/Discharge disposition and coordinating care.   SIGNED:   Damita Lack, MD  [facility] 04/06/2022, 1:04 PM   If 7PM-7AM, please contact night-coverage

## 2022-04-06 NOTE — TOC Transition Note (Addendum)
Transition of Care [facility]) - CM/SW Discharge Note   Patient Details  Name: EYTHAN JAYNE MRN: [id number] Date of Birth: May 23, 1933  Transition of Care [facility]) CM/SW Contact:  Ross Ludwig, [name] Phone Number: 04/06/2022, 12:54 PM   Clinical Narrative:     CSW spoke to [place] at [place] who said the hoyer lift and nebulizer machine has been ordered and was delivered at 11:54 am today.  CSW updated attending physician, bedside nurse, and patient's daughter [name].  Patient will need EMS transport back home, CSW will arrange for EMS transport.  Patient will be going home with home health PT, OT, and RN, through [place].  CSW signing off please reconsult with any other social work needs, home health agency has been notified of planned discharge.  2:30pm CSW contacted EMS to set up transport for around 3:30pm.  3:00pm  CSW was informed that patient's daughter [name] was concerned about not having access to a nutritionist regarding the tube feedings.  CSW spoke to [employer] at [place], and she said they do have a nutritionist on their team.  Pam will contact patient's daughter today to speak to her about her concerns.  CSW signing off.        Final next level of care: [place] Barriers to Discharge: Barriers Resolved   Patient Goals and CMS Choice Patient states their goals for this hospitalization and ongoing recovery are:: To return back home with home health. CMS Medicare.gov Compare Post Acute Care list provided to:: Patient Represenative (must comment) Choice offered to / list presented to : Adult Children  Discharge Placement                Patient to be transferred to facility by: [facility] Name of family member notified: Daughter [name] Patient and family notified of of transfer: 04/06/22  Discharge Plan and Services   Discharge Planning Services: CM Consult            DME Arranged: Other see comment, Nebulizer machine Optician, dispensing) DME Agency:  [facility] Date DME Agency Contacted: 04/06/22 Time DME Agency Contacted: [phone] Representative spoke with at DME Agency: [place]: PT, OT, RN [place] Agency: [place] Date [place]: 04/05/22 Time [place]: 1253 Representative spoke with at [place]: [place] ([place]) Interventions     Readmission Risk Interventions    01/16/2022    2:12 PM  Readmission Risk Prevention Plan  Transportation Screening Complete  Medication Review Press photographer) Referral to Pharmacy  PCP or Specialist appointment within 3-5 days of discharge Complete  HRI or [place] Complete  SW Recovery Care/Counseling Consult Complete  [place] Not Applicable

## 2022-04-07 ENCOUNTER — Encounter: Payer: Self-pay | Admitting: *Deleted

## 2022-04-07 ENCOUNTER — Telehealth: Payer: Self-pay | Admitting: *Deleted

## 2022-04-07 ENCOUNTER — Telehealth: Payer: Self-pay

## 2022-04-07 ENCOUNTER — Telehealth: Payer: Self-pay | Admitting: Family Medicine

## 2022-04-07 NOTE — Telephone Encounter (Signed)
Aware and verbalizes understanding.  

## 2022-04-07 NOTE — Telephone Encounter (Signed)
This was ordered by the hospitalist yesterday [id number].  I suspect his pharmacy is likely ordering. Would check with pharmacy re: med.

## 2022-04-07 NOTE — Telephone Encounter (Signed)
Ok to give verbal for this dressing

## 2022-04-07 NOTE — Telephone Encounter (Signed)
04/07/2022  Spoke with patient's daughter, [name], for a TCM call today. She has outreached to [facility] and is awaiting a call back. He has dressing change orders for every 2 days that will be due tomorrow. His daughter is a Marine scientist and can change the dressing. If possible she would like to get an order for Mepilex 8.7" x 9.1" sent to [place]. This was ordered by Dr Livia Snellen after an earlier hospitalization and it is what he was using during current admission.   Forwarding to PCP for review and action.   Chong Sicilian, BSN, RN-BC Proofreader Dial: [phone]

## 2022-04-07 NOTE — Telephone Encounter (Signed)
Patient was recently hospitalized and came home on 04/06/22.  One of the medications on his list, Potassium & Sodium Phosphate (Phos-Nak), they do not have a prescription for.  Daughter is wondering if this is something he should continue at home and if so they will need a prescription sent to [place].  Patient has a hospital follow up scheduled on 04/16/22.

## 2022-04-07 NOTE — Patient Outreach (Signed)
  Care Coordination [facility] Note Transition Care Management Follow-up Telephone Call Date of discharge and from where: 04/06/22 from [place] How have you been since you were released from the hospital? [place] but ok Any questions or concerns? Yes. Would like an order for dressing supplies, Mepilex, to have on hand. PCP office has ordered in the past. Home health will supply once they are scheduled to come out.  Daughter can change dressing in the interim. May also need home health to draw labs one week post discharge since PCP appt is a little further out. Will need orders from PCP. RN Care Coordinator to communicate these needs to PCP.  Items Reviewed: Did the pt receive and understand the discharge instructions provided? Yes  Medications obtained and verified? Yes  Other? No  Any new allergies since your discharge? No  Dietary orders reviewed? Yes Do you have support at home? Yes   Home Care and Equipment/Supplies: Were home health services ordered? yes If so, what is the name of the agency? [facility]  Has the agency set up a time to come to the patient's home? No. Daughter has reached out and is expecting a call back today. Were any new equipment or medical supplies ordered?  Yes: Hoyer lift and nebulizer What is the name of the medical supply agency? [facility] Were you able to get the supplies/equipment? yes Do you have any questions related to the use of the equipment or supplies? No  Functional Questionnaire: (I = Independent and D = Dependent) ADLs: D     Bathing/Dressing- D  Meal Prep- D  Eating- D  Maintaining continence- D  Transferring/Ambulation- D  Managing Meds- D  Follow up appointments reviewed:  PCP Hospital f/u appt confirmed? Yes  Scheduled to see Dr Lajuana Ripple on 04/16/22 @ 9:20. [place] [facility] f/u appt confirmed? No . Urology recommended. Daughter to call Dr Ralene [place]rat office [phone] to schedule. Are transportation arrangements needed? No  If  their condition worsens, is the pt aware to call PCP or go to the Emergency Dept.? Yes Was the patient provided with contact information for the PCP's office or ED? Yes Was to pt encouraged to call back with questions or concerns? Yes  SDOH assessments and interventions completed:   Yes  Care Coordination Interventions Activated:  Yes   Care Coordination Interventions:  Referred for Care Coordination Services:  RN Care Coordinator    Encounter Outcome:  Pt. Visit Completed    Chong Sicilian, BSN, RN-BC RN Care Coordinator Direct Dial: [phone]

## 2022-04-07 NOTE — Telephone Encounter (Signed)
Spoke with patient's daughter, appointment scheduled with Dr. Lajuana Ripple on 04/16/22 for a hospital follow up.

## 2022-04-08 NOTE — Telephone Encounter (Signed)
Gave order for Mepilex 8.7" x 9.1" dressing which they can get this does not have to go to [facility] but if there is no open wounds when nurse goes out tomorrow and it's more for heel cushion/bunny boots will need order to go to DME company.

## 2022-04-09 ENCOUNTER — Ambulatory Visit: Payer: Medicare Other | Admitting: Family Medicine

## 2022-04-09 DIAGNOSIS — D649 Anemia, unspecified: Secondary | ICD-10-CM | POA: Diagnosis not present

## 2022-04-09 DIAGNOSIS — I2510 Atherosclerotic heart disease of native coronary artery without angina pectoris: Secondary | ICD-10-CM | POA: Diagnosis not present

## 2022-04-09 DIAGNOSIS — J690 Pneumonitis due to inhalation of food and vomit: Secondary | ICD-10-CM | POA: Diagnosis not present

## 2022-04-09 DIAGNOSIS — N3001 Acute cystitis with hematuria: Secondary | ICD-10-CM | POA: Diagnosis not present

## 2022-04-09 DIAGNOSIS — I480 Paroxysmal atrial fibrillation: Secondary | ICD-10-CM | POA: Diagnosis not present

## 2022-04-09 DIAGNOSIS — Z931 Gastrostomy status: Secondary | ICD-10-CM | POA: Diagnosis not present

## 2022-04-09 DIAGNOSIS — Z792 Long term (current) use of antibiotics: Secondary | ICD-10-CM | POA: Diagnosis not present

## 2022-04-09 DIAGNOSIS — L8993 Pressure ulcer of unspecified site, stage 3: Secondary | ICD-10-CM | POA: Diagnosis not present

## 2022-04-09 DIAGNOSIS — Z7901 Long term (current) use of anticoagulants: Secondary | ICD-10-CM | POA: Diagnosis not present

## 2022-04-09 DIAGNOSIS — R627 Adult failure to thrive: Secondary | ICD-10-CM | POA: Diagnosis not present

## 2022-04-10 DIAGNOSIS — N3001 Acute cystitis with hematuria: Secondary | ICD-10-CM | POA: Diagnosis not present

## 2022-04-10 DIAGNOSIS — I480 Paroxysmal atrial fibrillation: Secondary | ICD-10-CM | POA: Diagnosis not present

## 2022-04-10 DIAGNOSIS — D649 Anemia, unspecified: Secondary | ICD-10-CM | POA: Diagnosis not present

## 2022-04-10 DIAGNOSIS — Z792 Long term (current) use of antibiotics: Secondary | ICD-10-CM | POA: Diagnosis not present

## 2022-04-10 DIAGNOSIS — L8993 Pressure ulcer of unspecified site, stage 3: Secondary | ICD-10-CM | POA: Diagnosis not present

## 2022-04-10 DIAGNOSIS — I2510 Atherosclerotic heart disease of native coronary artery without angina pectoris: Secondary | ICD-10-CM | POA: Diagnosis not present

## 2022-04-10 DIAGNOSIS — Z931 Gastrostomy status: Secondary | ICD-10-CM | POA: Diagnosis not present

## 2022-04-10 DIAGNOSIS — R627 Adult failure to thrive: Secondary | ICD-10-CM | POA: Diagnosis not present

## 2022-04-10 DIAGNOSIS — J690 Pneumonitis due to inhalation of food and vomit: Secondary | ICD-10-CM | POA: Diagnosis not present

## 2022-04-10 DIAGNOSIS — Z7901 Long term (current) use of anticoagulants: Secondary | ICD-10-CM | POA: Diagnosis not present

## 2022-04-11 ENCOUNTER — Telehealth: Payer: Self-pay | Admitting: *Deleted

## 2022-04-11 DIAGNOSIS — S31104A Unspecified open wound of abdominal wall, left lower quadrant without penetration into peritoneal cavity, initial encounter: Secondary | ICD-10-CM | POA: Diagnosis not present

## 2022-04-11 NOTE — Telephone Encounter (Signed)
TC from [place] w/ [facility] Saw pt yesterday, wanting to know about lowering BP call parameters on pt. His BP was 90/40, daughter told [facility] nurse this was about his normal range. He has spot around his J tube and the healed wound on his bottom she feels does need some protection. Not sure if insurance will pay for the Mepilex dressing for this or if duoderm would be covered, she will check on this with her office then let the daughter know which dressing they will go with. Heels are also healed.

## 2022-04-12 DIAGNOSIS — I69359 Hemiplegia and hemiparesis following cerebral infarction affecting unspecified side: Secondary | ICD-10-CM | POA: Diagnosis not present

## 2022-04-12 DIAGNOSIS — L89153 Pressure ulcer of sacral region, stage 3: Secondary | ICD-10-CM | POA: Diagnosis not present

## 2022-04-14 DIAGNOSIS — Z7901 Long term (current) use of anticoagulants: Secondary | ICD-10-CM | POA: Diagnosis not present

## 2022-04-14 DIAGNOSIS — L8993 Pressure ulcer of unspecified site, stage 3: Secondary | ICD-10-CM | POA: Diagnosis not present

## 2022-04-14 DIAGNOSIS — I2510 Atherosclerotic heart disease of native coronary artery without angina pectoris: Secondary | ICD-10-CM | POA: Diagnosis not present

## 2022-04-14 DIAGNOSIS — Z931 Gastrostomy status: Secondary | ICD-10-CM | POA: Diagnosis not present

## 2022-04-14 DIAGNOSIS — N3001 Acute cystitis with hematuria: Secondary | ICD-10-CM | POA: Diagnosis not present

## 2022-04-14 DIAGNOSIS — I480 Paroxysmal atrial fibrillation: Secondary | ICD-10-CM | POA: Diagnosis not present

## 2022-04-14 DIAGNOSIS — Z792 Long term (current) use of antibiotics: Secondary | ICD-10-CM | POA: Diagnosis not present

## 2022-04-14 DIAGNOSIS — R627 Adult failure to thrive: Secondary | ICD-10-CM | POA: Diagnosis not present

## 2022-04-14 DIAGNOSIS — D649 Anemia, unspecified: Secondary | ICD-10-CM | POA: Diagnosis not present

## 2022-04-14 DIAGNOSIS — J690 Pneumonitis due to inhalation of food and vomit: Secondary | ICD-10-CM | POA: Diagnosis not present

## 2022-04-15 DIAGNOSIS — L8993 Pressure ulcer of unspecified site, stage 3: Secondary | ICD-10-CM | POA: Diagnosis not present

## 2022-04-15 DIAGNOSIS — Z931 Gastrostomy status: Secondary | ICD-10-CM | POA: Diagnosis not present

## 2022-04-15 DIAGNOSIS — Z7901 Long term (current) use of anticoagulants: Secondary | ICD-10-CM | POA: Diagnosis not present

## 2022-04-15 DIAGNOSIS — N3001 Acute cystitis with hematuria: Secondary | ICD-10-CM | POA: Diagnosis not present

## 2022-04-15 DIAGNOSIS — D649 Anemia, unspecified: Secondary | ICD-10-CM | POA: Diagnosis not present

## 2022-04-15 DIAGNOSIS — I480 Paroxysmal atrial fibrillation: Secondary | ICD-10-CM | POA: Diagnosis not present

## 2022-04-15 DIAGNOSIS — R627 Adult failure to thrive: Secondary | ICD-10-CM | POA: Diagnosis not present

## 2022-04-15 DIAGNOSIS — J690 Pneumonitis due to inhalation of food and vomit: Secondary | ICD-10-CM | POA: Diagnosis not present

## 2022-04-15 DIAGNOSIS — Z792 Long term (current) use of antibiotics: Secondary | ICD-10-CM | POA: Diagnosis not present

## 2022-04-15 DIAGNOSIS — I2510 Atherosclerotic heart disease of native coronary artery without angina pectoris: Secondary | ICD-10-CM | POA: Diagnosis not present

## 2022-04-16 ENCOUNTER — Encounter: Payer: Self-pay | Admitting: Family Medicine

## 2022-04-16 ENCOUNTER — Ambulatory Visit (INDEPENDENT_AMBULATORY_CARE_PROVIDER_SITE_OTHER): Payer: Medicare Other | Admitting: Family Medicine

## 2022-04-16 VITALS — BP 132/72 | HR 88 | Temp 97.8°F | Resp 20 | Ht 71.0 in | Wt 179.0 lb

## 2022-04-16 DIAGNOSIS — Z09 Encounter for follow-up examination after completed treatment for conditions other than malignant neoplasm: Secondary | ICD-10-CM | POA: Diagnosis not present

## 2022-04-16 DIAGNOSIS — N3001 Acute cystitis with hematuria: Secondary | ICD-10-CM

## 2022-04-16 DIAGNOSIS — R627 Adult failure to thrive: Secondary | ICD-10-CM | POA: Diagnosis not present

## 2022-04-16 DIAGNOSIS — R110 Nausea: Secondary | ICD-10-CM | POA: Diagnosis not present

## 2022-04-16 DIAGNOSIS — Z931 Gastrostomy status: Secondary | ICD-10-CM

## 2022-04-16 MED ORDER — ONDANSETRON 4 MG PO TBDP
4.0000 mg | ORAL_TABLET | Freq: Once | ORAL | Status: AC
  Administered 2022-04-16: 4 mg via ORAL
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
## 2022-04-17 LAB — CBC
Hematocrit: 30 % — ABNORMAL LOW (ref 37.5–51.0)
Hemoglobin: 9.3 g/dL — ABNORMAL LOW (ref 13.0–17.7)
MCH: 28.3 pg (ref 26.6–33.0)
MCHC: 31 g/dL — ABNORMAL LOW (ref 31.5–35.7)
MCV: 91 fL (ref 79–97)
Platelets: 136 10*3/uL — ABNORMAL LOW (ref 150–450)
RBC: 3.29 x10E6/uL — ABNORMAL LOW (ref 4.14–5.80)
RDW: 18.8 % — ABNORMAL HIGH (ref 11.6–15.4)
WBC: 6.5 10*3/uL (ref 3.4–10.8)

## 2022-04-17 LAB — BASIC METABOLIC PANEL
BUN/Creatinine Ratio: 64 — ABNORMAL HIGH (ref 10–24)
BUN: 41 mg/dL — ABNORMAL HIGH (ref 8–27)
CO2: 25 mmol/L (ref 20–29)
Calcium: 9.7 mg/dL (ref 8.6–10.2)
Chloride: 97 mmol/L (ref 96–106)
Creatinine, Ser: 0.64 mg/dL — ABNORMAL LOW (ref 0.76–1.27)
Glucose: 145 mg/dL — ABNORMAL HIGH (ref 70–99)
Potassium: 4.8 mmol/L (ref 3.5–5.2)
Sodium: 137 mmol/L (ref 134–144)
eGFR: 92 mL/min/{1.73_m2} (ref >59)

## 2022-04-19 DIAGNOSIS — Z931 Gastrostomy status: Secondary | ICD-10-CM | POA: Diagnosis not present

## 2022-04-19 DIAGNOSIS — E43 Unspecified severe protein-calorie malnutrition: Secondary | ICD-10-CM | POA: Diagnosis not present

## 2022-04-19 DIAGNOSIS — E119 Type 2 diabetes mellitus without complications: Secondary | ICD-10-CM | POA: Diagnosis not present

## 2022-04-19 DIAGNOSIS — R1310 Dysphagia, unspecified: Secondary | ICD-10-CM | POA: Diagnosis not present

## 2022-04-19 DIAGNOSIS — G9341 Metabolic encephalopathy: Secondary | ICD-10-CM | POA: Diagnosis not present

## 2022-04-22 DIAGNOSIS — D696 Thrombocytopenia, unspecified: Secondary | ICD-10-CM | POA: Diagnosis not present

## 2022-04-22 DIAGNOSIS — Z87440 Personal history of urinary (tract) infections: Secondary | ICD-10-CM | POA: Diagnosis not present

## 2022-04-22 DIAGNOSIS — E1165 Type 2 diabetes mellitus with hyperglycemia: Secondary | ICD-10-CM | POA: Diagnosis not present

## 2022-04-22 DIAGNOSIS — Z931 Gastrostomy status: Secondary | ICD-10-CM | POA: Diagnosis not present

## 2022-04-22 DIAGNOSIS — I480 Paroxysmal atrial fibrillation: Secondary | ICD-10-CM | POA: Diagnosis not present

## 2022-04-22 DIAGNOSIS — Z7901 Long term (current) use of anticoagulants: Secondary | ICD-10-CM | POA: Diagnosis not present

## 2022-04-22 DIAGNOSIS — J690 Pneumonitis due to inhalation of food and vomit: Secondary | ICD-10-CM | POA: Diagnosis not present

## 2022-04-22 DIAGNOSIS — R1312 Dysphagia, oropharyngeal phase: Secondary | ICD-10-CM | POA: Diagnosis not present

## 2022-04-22 DIAGNOSIS — N281 Cyst of kidney, acquired: Secondary | ICD-10-CM | POA: Diagnosis not present

## 2022-04-22 DIAGNOSIS — Z7984 Long term (current) use of oral hypoglycemic drugs: Secondary | ICD-10-CM | POA: Diagnosis not present

## 2022-04-23 ENCOUNTER — Other Ambulatory Visit: Payer: Self-pay | Admitting: Family Medicine

## 2022-04-23 MED ORDER — ONDANSETRON 8 MG PO TBDP
8.0000 mg | ORAL_TABLET | Freq: Three times a day (TID) | ORAL | 3 refills | Status: DC | PRN
Start: 2022-04-23 — End: 2022-05-26

## 2022-04-23 NOTE — Telephone Encounter (Signed)
  Prescription Request  04/23/2022  Is this a "Controlled Substance" medicine? no  Have you seen your PCP in the last 2 weeks? yes  If YES, route message to pool  -  If NO, patient needs to be scheduled for appointment.  What is the name of the medication or equipment? ondansetron (ZOFRAN-ODT) 8 MG disintegrating tablet  Have you contacted your pharmacy to request a refill? yes   Which [facility] [place]    Patient notified that their request is being sent to the clinical staff for review and that they should receive a response within 2 business days.

## 2022-04-24 ENCOUNTER — Ambulatory Visit: Payer: Self-pay | Admitting: *Deleted

## 2022-04-24 ENCOUNTER — Encounter: Payer: Self-pay | Admitting: *Deleted

## 2022-04-24 DIAGNOSIS — D696 Thrombocytopenia, unspecified: Secondary | ICD-10-CM | POA: Diagnosis not present

## 2022-04-24 DIAGNOSIS — N281 Cyst of kidney, acquired: Secondary | ICD-10-CM | POA: Diagnosis not present

## 2022-04-24 DIAGNOSIS — Z931 Gastrostomy status: Secondary | ICD-10-CM | POA: Diagnosis not present

## 2022-04-24 DIAGNOSIS — I480 Paroxysmal atrial fibrillation: Secondary | ICD-10-CM | POA: Diagnosis not present

## 2022-04-24 DIAGNOSIS — J690 Pneumonitis due to inhalation of food and vomit: Secondary | ICD-10-CM | POA: Diagnosis not present

## 2022-04-24 DIAGNOSIS — Z7984 Long term (current) use of oral hypoglycemic drugs: Secondary | ICD-10-CM | POA: Diagnosis not present

## 2022-04-24 DIAGNOSIS — E1165 Type 2 diabetes mellitus with hyperglycemia: Secondary | ICD-10-CM | POA: Diagnosis not present

## 2022-04-24 DIAGNOSIS — Z87440 Personal history of urinary (tract) infections: Secondary | ICD-10-CM | POA: Diagnosis not present

## 2022-04-24 DIAGNOSIS — Z7901 Long term (current) use of anticoagulants: Secondary | ICD-10-CM | POA: Diagnosis not present

## 2022-04-24 DIAGNOSIS — R1312 Dysphagia, oropharyngeal phase: Secondary | ICD-10-CM | POA: Diagnosis not present

## 2022-04-24 NOTE — Patient Outreach (Signed)
  Care Coordination   Initial Visit Note   04/24/2022 Name: [name] MRN: [id number] DOB: [birth year]  [name] is a 88 y.o. year old male who sees Calvin Norlander, DO for primary care. I spoke with [name] by phone today.  What matters to the patients health and wellness today?  Doing well has home health OT visit today Potassium was 4.8 and [name] reports potassium medicine held Need to find available Mepilex  6 x 6  Voiced interest in a possible non oral pain management plan ( muscle relaxant, cream) to reduce the pain that occurs during home therapy exercises but not make him drowsy .  Other home treatment has included warm compresses, Tylenol   Goals Addressed               This Visit's Progress     Patient Stated     pain management ([facility]) (pt-stated)   Not on track     Care Coordination Interventions: Reviewed provider established plan for pain management Screening for signs and symptoms of depression related to chronic disease state  Assessed social determinant of health barriers Discussed use of other non oral pain interventions to include pain relief creams. He does have lidocaine patches available    Outreach made to pcp related to a possible non oral pain management plan ( muscle relaxant, cream) to reduce the pain that occurs during home therapy exercises but not make him drowsy . Home pain treatment has included warm compresses & Tylenol without success      wound dressing supplies ([facility] (pt-stated)   Not on track     Care Coordination Interventions: Evaluation of current treatment plan related to   Mepilex wound dressing supply and patient's adherence to plan as established by provider Discussed plans with patient for ongoing care management follow up and provided patient with direct contact information for care management team Screening for signs and symptoms of depression related to chronic disease state  Assessed social determinant of  health barriers Confirm type of dressing needed Inquired about home health RN visits for further dressing supplies (none needed after home health intake as [name], daughter is a Marine scientist  Noted on 04/11/22 EPIC note in which [facility] RN [name] outreached to pcp about use of Mepilex or DuoDerm as insurance may not cover Mepilex  Mepilex noted to able to be purchased [facility] ([place] supply ([name]) stores          SDOH assessments and interventions completed:  Yes  SDOH Interventions Today    Flowsheet Row Most Recent Value  SDOH Interventions   Food Insecurity Interventions Intervention Not Indicated  Transportation Interventions Intervention Not Indicated        Care Coordination Interventions Activated:  Yes  Care Coordination Interventions:  Yes, provided   Follow up plan: Follow up call scheduled for 05/08/22    Encounter Outcome:  Pt. Visit Completed   [name] L. Calvin Hamman, RN, BSN, [facility] Coordinator Office number [phone]

## 2022-04-24 NOTE — Telephone Encounter (Signed)
Aware refill sent to [place] said that when lab results were given last week and was informed that his potassium was 4.8 and he had been nauseated this was on lab result note but did not look like it was routed anywhere "She asked about sodium and potassium.  I reported that both were in range.  She wants to know if she should stop or cutback on Phos-Nak supplement or is it the reason he is in range.   Please advise" Today she reports that she did stop his potassium since it was within normal range. Please advise if this is is appropriate.

## 2022-04-24 NOTE — Patient Instructions (Addendum)
Visit Information  Thank you for taking time to visit with me today. Please don't hesitate to contact me if I can be of assistance to you.   Following are the goals we discussed today:   Goals Addressed               This Visit's Progress     Patient Stated     pain management [facility]) (pt-stated)   Not on track     Care Coordination Interventions: Reviewed provider established plan for pain management Screening for signs and symptoms of depression related to chronic disease state  Assessed social determinant of health barriers Discussed use of other non oral pain interventions to include pain relief creams. He does have lidocaine patches available    Outreach made to pcp related to a possible non oral pain management plan ( muscle relaxant, cream) to reduce the pain that occurs during home therapy exercises but not make him drowsy . Home pain treatment has included warm compresses & Tylenol without success      wound dressing supplies ([facility] (pt-stated)   Not on track     Care Coordination Interventions: Evaluation of current treatment plan related to   Mepilex wound dressing supply and patient's adherence to plan as established by provider Discussed plans with patient for ongoing care management follow up and provided patient with direct contact information for care management team Screening for signs and symptoms of depression related to chronic disease state  Assessed social determinant of health barriers Confirm type of dressing needed Inquired about home health RN visits for further dressing supplies (none needed after home health intake as [name], daughter is a Marine scientist  Noted on 04/11/22 EPIC note in which [facility] RN [name] outreached to pcp about use of Mepilex or DuoDerm as insurance may not cover Mepilex  Mepilex noted to able to be purchased at [facility] ([place] & [employer] supply ([place]) stores          Our next appointment is by  telephone on 05/08/22 at 1 pm   Please call the care guide team at [phone] if you need to cancel or reschedule your appointment.   If you are experiencing a Mental Health or [place] or need someone to talk to, please call the Suicide and Crisis Lifeline: 988 call the [place] National Suicide Prevention Lifeline: 617 809 1262 or TTY: (606) 290-3281 TTY 925-470-9796) to talk to a trained counselor call 1-800-273-TALK (toll free, 24 hour hotline) go to [facility] [address], [place] [phone]) call 911   Patient verbalizes understanding of instructions and care plan provided today and agrees to view in [place]. Active MyChart status and patient understanding of how to access instructions and care plan via MyChart confirmed with patient.     The patient has been provided with contact information for the care management team and has been advised to call with any health related questions or concerns.   [place] Lavina Hamman, RN, BSN, [place] Coordinator Office number [phone]

## 2022-04-25 ENCOUNTER — Other Ambulatory Visit: Payer: Self-pay | Admitting: Family Medicine

## 2022-04-25 ENCOUNTER — Ambulatory Visit: Payer: Self-pay | Admitting: *Deleted

## 2022-04-25 DIAGNOSIS — G8929 Other chronic pain: Secondary | ICD-10-CM

## 2022-04-25 MED ORDER — LIDOCAINE 5 % EX PTCH
1.0000 | MEDICATED_PATCH | CUTANEOUS | 5 refills | Status: DC
Start: 2022-04-25 — End: 2022-09-26

## 2022-04-25 MED ORDER — DICLOFENAC SODIUM 1 % EX GEL: 2.0000 g | Freq: Four times a day (QID) | CUTANEOUS | 99 refills | Status: DC | PRN

## 2022-04-25 NOTE — Patient Instructions (Addendum)
Visit Information  Thank you for taking time to visit with me today. Please don't hesitate to contact me if I can be of assistance to you.   Following are the goals we discussed today:   Goals Addressed               This Visit's Progress     Patient Stated     pain management [facility]) (pt-stated)   On track     Care Coordination Interventions: Dr Lajuana Ripple prescribed Lidocaine patches and Voltaren gel for [name] pain  Unfortunately, his options are severely limited due to anticoagulation and other comorbidities.  Additionally, all muscle relaxers have the potential for sedation.  Hopefully, the topicals will be helpful  Updated [name] to Dr Lajuana Ripple about the potassium being held for a recent reported potassium value of 4.8. MD recommends since he still has very little PO intake this is what is keeping him in normal range. given normal renal function, he will excrete anything he does not need through urine. once he is taking more by mouth, he should be able to come off of this. If there is a way for the dietician can build some extra potassium into his feeds then we could get rid of the supplement but otherwise would continue it for now      wound dressing supplies ([facility] (pt-stated)   On track     Care Coordination Interventions: Noted on 04/11/22 EPIC note in which [facility] RN [name] outreached to pcp about use of Mepilex or DuoDerm as insurance may not cover Mepilex It is noted to able to be purchased at [facility] ([place] supply ([place]) stores  Outreach to [name] and spoke with staff who confirms generally they do not sale enough in the store but can order some if needed. [name] updated         Our next appointment is by telephone on 05/08/22 at 1 pm  Please call the care guide team at [phone] if you need to cancel or reschedule your appointment.   If you are experiencing a Mental Health or [place] or need someone to talk to, please call the Suicide and Crisis Lifeline: 988 call the [place] National Suicide Prevention Lifeline: (772)702-3421 or TTY: (443)782-2673 TTY 307-206-1304) to talk to a trained counselor call 1-800-273-TALK (toll free, 24 hour hotline) call the [facility]: [phone] call 911   Patient verbalizes understanding of instructions and care plan provided today and agrees to view in [place]. Active MyChart status and patient understanding of how to access instructions and care plan via MyChart confirmed with patient.     The patient has been provided with contact information for the care management team and has been advised to call with any health related questions or concerns.   [place] Lavina Hamman, RN, BSN, [place] Coordinator Office number [phone]

## 2022-04-25 NOTE — Patient Outreach (Addendum)
  Care Coordination   Follow Up Visit Note   04/25/2022 Name: [name] MRN: [id number] DOB: 11/28/1932  [name] is a 88 y.o. year old male who sees [name], DO for primary care. I spoke with  [name] by phone today.  What matters to the patients health and wellness today?  Pain medicine prescribe (lidocaine patch and Voltaren gel, clarification related to the administration of the potassium, wound dressing supplies     Goals Addressed               This Visit's Progress     Patient Stated     pain management (THN) (pt-stated)   On track     Care Coordination Interventions: [name] prescribed Lidocaine patches and Voltaren gel for [name] pain  Unfortunately, his options are severely limited due to anticoagulation and other comorbidities.  Additionally, all muscle relaxers have the potential for sedation.  Hopefully, the topicals will be helpful  Updated [name] to [name] about the potassium being held for a recent reported potassium value of 4.8. MD recommends since he still has very little PO intake this is what is keeping him in normal range. given normal renal function, he will excrete anything he does not need through urine. once he is taking more by mouth, he should be able to come off of this. If there is a way for the dietician can build some extra potassium into his feeds then we could get rid of the supplement but otherwise would continue it for now      wound dressing supplies (THN (pt-stated)   On track     Care Coordination Interventions: Noted on 04/11/22 EPIC note in which [facility] RN [name] outreached to pcp about use of Mepilex or DuoDerm as insurance may not cover Mepilex It is noted to able to be purchased at [facility] supply ([place]) stores  Outreach to [name] and spoke with staff who confirms generally they do not sale enough in the store but can order  some if needed. [name] updated         SDOH assessments and interventions completed:  No     Care Coordination Interventions Activated:  Yes  Care Coordination Interventions:  Yes, provided   Follow up plan: Follow up call scheduled for 05/08/22 1 pm     Encounter Outcome:  Pt. Visit Completed   Jaydn Moscato L. Lavina Hamman, RN, BSN, [place] Coordinator Office number [phone]

## 2022-04-26 DIAGNOSIS — G9341 Metabolic encephalopathy: Secondary | ICD-10-CM | POA: Diagnosis not present

## 2022-04-26 DIAGNOSIS — Z931 Gastrostomy status: Secondary | ICD-10-CM | POA: Diagnosis not present

## 2022-04-26 DIAGNOSIS — E119 Type 2 diabetes mellitus without complications: Secondary | ICD-10-CM | POA: Diagnosis not present

## 2022-04-26 DIAGNOSIS — R1310 Dysphagia, unspecified: Secondary | ICD-10-CM | POA: Diagnosis not present

## 2022-04-26 DIAGNOSIS — Z9189 Other specified personal risk factors, not elsewhere classified: Secondary | ICD-10-CM | POA: Diagnosis not present

## 2022-04-29 ENCOUNTER — Telehealth: Payer: Self-pay | Admitting: Family Medicine

## 2022-04-29 ENCOUNTER — Other Ambulatory Visit: Payer: Self-pay | Admitting: Family Medicine

## 2022-04-29 MED ORDER — FLUCONAZOLE 150 MG PO TABS: 150.0000 mg | ORAL_TABLET | Freq: Once | ORAL | 0 refills | Status: AC

## 2022-04-29 NOTE — Telephone Encounter (Signed)
sent 

## 2022-04-29 NOTE — Telephone Encounter (Signed)
Daughter aware.

## 2022-04-30 DIAGNOSIS — I480 Paroxysmal atrial fibrillation: Secondary | ICD-10-CM | POA: Diagnosis not present

## 2022-04-30 DIAGNOSIS — Z7901 Long term (current) use of anticoagulants: Secondary | ICD-10-CM | POA: Diagnosis not present

## 2022-04-30 DIAGNOSIS — Z931 Gastrostomy status: Secondary | ICD-10-CM | POA: Diagnosis not present

## 2022-04-30 DIAGNOSIS — D696 Thrombocytopenia, unspecified: Secondary | ICD-10-CM | POA: Diagnosis not present

## 2022-04-30 DIAGNOSIS — R1312 Dysphagia, oropharyngeal phase: Secondary | ICD-10-CM | POA: Diagnosis not present

## 2022-04-30 DIAGNOSIS — N3001 Acute cystitis with hematuria: Secondary | ICD-10-CM | POA: Diagnosis not present

## 2022-04-30 DIAGNOSIS — Z7984 Long term (current) use of oral hypoglycemic drugs: Secondary | ICD-10-CM | POA: Diagnosis not present

## 2022-04-30 DIAGNOSIS — N281 Cyst of kidney, acquired: Secondary | ICD-10-CM | POA: Diagnosis not present

## 2022-04-30 DIAGNOSIS — R310 Gross hematuria: Secondary | ICD-10-CM | POA: Diagnosis not present

## 2022-04-30 DIAGNOSIS — E1165 Type 2 diabetes mellitus with hyperglycemia: Secondary | ICD-10-CM | POA: Diagnosis not present

## 2022-04-30 DIAGNOSIS — J690 Pneumonitis due to inhalation of food and vomit: Secondary | ICD-10-CM | POA: Diagnosis not present

## 2022-04-30 DIAGNOSIS — Z87440 Personal history of urinary (tract) infections: Secondary | ICD-10-CM | POA: Diagnosis not present

## 2022-05-01 DIAGNOSIS — Z7984 Long term (current) use of oral hypoglycemic drugs: Secondary | ICD-10-CM | POA: Diagnosis not present

## 2022-05-01 DIAGNOSIS — R1312 Dysphagia, oropharyngeal phase: Secondary | ICD-10-CM | POA: Diagnosis not present

## 2022-05-01 DIAGNOSIS — J690 Pneumonitis due to inhalation of food and vomit: Secondary | ICD-10-CM | POA: Diagnosis not present

## 2022-05-01 DIAGNOSIS — D696 Thrombocytopenia, unspecified: Secondary | ICD-10-CM | POA: Diagnosis not present

## 2022-05-01 DIAGNOSIS — Z87440 Personal history of urinary (tract) infections: Secondary | ICD-10-CM | POA: Diagnosis not present

## 2022-05-01 DIAGNOSIS — Z931 Gastrostomy status: Secondary | ICD-10-CM | POA: Diagnosis not present

## 2022-05-01 DIAGNOSIS — I480 Paroxysmal atrial fibrillation: Secondary | ICD-10-CM | POA: Diagnosis not present

## 2022-05-01 DIAGNOSIS — N281 Cyst of kidney, acquired: Secondary | ICD-10-CM | POA: Diagnosis not present

## 2022-05-01 DIAGNOSIS — Z7901 Long term (current) use of anticoagulants: Secondary | ICD-10-CM | POA: Diagnosis not present

## 2022-05-01 DIAGNOSIS — E1165 Type 2 diabetes mellitus with hyperglycemia: Secondary | ICD-10-CM | POA: Diagnosis not present

## 2022-05-02 DIAGNOSIS — N281 Cyst of kidney, acquired: Secondary | ICD-10-CM | POA: Diagnosis not present

## 2022-05-02 DIAGNOSIS — Z7984 Long term (current) use of oral hypoglycemic drugs: Secondary | ICD-10-CM | POA: Diagnosis not present

## 2022-05-02 DIAGNOSIS — Z931 Gastrostomy status: Secondary | ICD-10-CM | POA: Diagnosis not present

## 2022-05-02 DIAGNOSIS — Z87440 Personal history of urinary (tract) infections: Secondary | ICD-10-CM | POA: Diagnosis not present

## 2022-05-02 DIAGNOSIS — R1312 Dysphagia, oropharyngeal phase: Secondary | ICD-10-CM | POA: Diagnosis not present

## 2022-05-02 DIAGNOSIS — J690 Pneumonitis due to inhalation of food and vomit: Secondary | ICD-10-CM | POA: Diagnosis not present

## 2022-05-02 DIAGNOSIS — I480 Paroxysmal atrial fibrillation: Secondary | ICD-10-CM | POA: Diagnosis not present

## 2022-05-02 DIAGNOSIS — E1165 Type 2 diabetes mellitus with hyperglycemia: Secondary | ICD-10-CM | POA: Diagnosis not present

## 2022-05-02 DIAGNOSIS — D696 Thrombocytopenia, unspecified: Secondary | ICD-10-CM | POA: Diagnosis not present

## 2022-05-02 DIAGNOSIS — Z7901 Long term (current) use of anticoagulants: Secondary | ICD-10-CM | POA: Diagnosis not present

## 2022-05-05 ENCOUNTER — Other Ambulatory Visit: Payer: Self-pay | Admitting: Family Medicine

## 2022-05-05 DIAGNOSIS — Z7984 Long term (current) use of oral hypoglycemic drugs: Secondary | ICD-10-CM | POA: Diagnosis not present

## 2022-05-05 DIAGNOSIS — N281 Cyst of kidney, acquired: Secondary | ICD-10-CM | POA: Diagnosis not present

## 2022-05-05 DIAGNOSIS — E1165 Type 2 diabetes mellitus with hyperglycemia: Secondary | ICD-10-CM | POA: Diagnosis not present

## 2022-05-05 DIAGNOSIS — J690 Pneumonitis due to inhalation of food and vomit: Secondary | ICD-10-CM | POA: Diagnosis not present

## 2022-05-05 DIAGNOSIS — R1312 Dysphagia, oropharyngeal phase: Secondary | ICD-10-CM | POA: Diagnosis not present

## 2022-05-05 DIAGNOSIS — Z931 Gastrostomy status: Secondary | ICD-10-CM | POA: Diagnosis not present

## 2022-05-05 DIAGNOSIS — I480 Paroxysmal atrial fibrillation: Secondary | ICD-10-CM | POA: Diagnosis not present

## 2022-05-05 DIAGNOSIS — Z87440 Personal history of urinary (tract) infections: Secondary | ICD-10-CM | POA: Diagnosis not present

## 2022-05-05 DIAGNOSIS — D696 Thrombocytopenia, unspecified: Secondary | ICD-10-CM | POA: Diagnosis not present

## 2022-05-05 DIAGNOSIS — Z7901 Long term (current) use of anticoagulants: Secondary | ICD-10-CM | POA: Diagnosis not present

## 2022-05-06 ENCOUNTER — Telehealth: Payer: Self-pay | Admitting: *Deleted

## 2022-05-06 DIAGNOSIS — D696 Thrombocytopenia, unspecified: Secondary | ICD-10-CM | POA: Diagnosis not present

## 2022-05-06 DIAGNOSIS — Z7901 Long term (current) use of anticoagulants: Secondary | ICD-10-CM | POA: Diagnosis not present

## 2022-05-06 DIAGNOSIS — E1165 Type 2 diabetes mellitus with hyperglycemia: Secondary | ICD-10-CM | POA: Diagnosis not present

## 2022-05-06 DIAGNOSIS — R1312 Dysphagia, oropharyngeal phase: Secondary | ICD-10-CM | POA: Diagnosis not present

## 2022-05-06 DIAGNOSIS — J690 Pneumonitis due to inhalation of food and vomit: Secondary | ICD-10-CM | POA: Diagnosis not present

## 2022-05-06 DIAGNOSIS — Z931 Gastrostomy status: Secondary | ICD-10-CM | POA: Diagnosis not present

## 2022-05-06 DIAGNOSIS — N281 Cyst of kidney, acquired: Secondary | ICD-10-CM | POA: Diagnosis not present

## 2022-05-06 DIAGNOSIS — Z87440 Personal history of urinary (tract) infections: Secondary | ICD-10-CM | POA: Diagnosis not present

## 2022-05-06 DIAGNOSIS — Z7984 Long term (current) use of oral hypoglycemic drugs: Secondary | ICD-10-CM | POA: Diagnosis not present

## 2022-05-06 DIAGNOSIS — I480 Paroxysmal atrial fibrillation: Secondary | ICD-10-CM | POA: Diagnosis not present

## 2022-05-06 NOTE — Patient Outreach (Signed)
  Care Coordination   Care coordination  Visit Note   05/06/2022 Name: [name] MRN: [id number] DOB: 1933-03-26  [name] is a 88 y.o. year old male who sees [name], DO for primary care. I  outreached via e-mail to Chapman Fitch, dietitian   What matters to the patients health and wellness today?  Managing potassium/tube feeding     Goals Addressed               This Visit's Progress     Patient Stated     manage potassium (THN) (pt-stated)        Care Coordination Interventions: Collaborated with Dr Lajuana Ripple regarding patient administration of potassium E-mail outreach to [employer], dietitian to collaborate about possible tube feeding adjustment related to potassium as discussed by PCP on 04/25/22         SDOH assessments and interventions completed:  No     Care Coordination Interventions Activated:  Yes  Care Coordination Interventions:  Yes, provided   Follow up plan: Follow up call scheduled for 05/08/22 1 pm    Encounter Outcome:  Pt. Visit Completed   Vayda Dungee L. Lavina Hamman, RN, BSN, [place] Coordinator Office number [phone]

## 2022-05-06 NOTE — Patient Instructions (Signed)
Visit Information  Thank you for taking time to visit with me today. Please don't hesitate to contact me if I can be of assistance to you.   Following are the goals we discussed today:   Goals Addressed               This Visit's Progress     Patient Stated     manage potassium (THN) (pt-stated)        Care Coordination Interventions: Collaborated with Dr Lajuana Ripple regarding patient administration of potassium E-mail outreach to [employer], dietitian to collaborate about possible tube feeding adjustment related to potassium as discussed by PCP on 04/25/22         Our next appointment is by telephone on 05/08/22 at 1 pm  Please call the care guide team at [phone] if you need to cancel or reschedule your appointment.   If you are experiencing a Mental Health or [place] or need someone to talk to, please call the Suicide and Crisis Lifeline: 988 call the [place] National Suicide Prevention Lifeline: 346-175-2832 or TTY: 727-440-0080 TTY 249 672 7289) to talk to a trained counselor call 1-800-273-TALK (toll free, 24 hour hotline) call the [facility]: [phone] call 911   Patient verbalizes understanding of instructions and care plan provided today and agrees to view in [place]. Active MyChart status and patient understanding of how to access instructions and care plan via MyChart confirmed with patient.     The patient has been provided with contact information for the care management team and has been advised to call with any health related questions or concerns.   [place] Lavina Hamman, RN, BSN, [place] Coordinator Office number [phone]

## 2022-05-07 ENCOUNTER — Ambulatory Visit (INDEPENDENT_AMBULATORY_CARE_PROVIDER_SITE_OTHER): Payer: Medicare Other

## 2022-05-07 DIAGNOSIS — R1310 Dysphagia, unspecified: Secondary | ICD-10-CM | POA: Diagnosis not present

## 2022-05-07 DIAGNOSIS — N281 Cyst of kidney, acquired: Secondary | ICD-10-CM

## 2022-05-07 DIAGNOSIS — I480 Paroxysmal atrial fibrillation: Secondary | ICD-10-CM | POA: Diagnosis not present

## 2022-05-07 DIAGNOSIS — E43 Unspecified severe protein-calorie malnutrition: Secondary | ICD-10-CM | POA: Diagnosis not present

## 2022-05-07 DIAGNOSIS — Z87440 Personal history of urinary (tract) infections: Secondary | ICD-10-CM

## 2022-05-07 DIAGNOSIS — J690 Pneumonitis due to inhalation of food and vomit: Secondary | ICD-10-CM

## 2022-05-07 DIAGNOSIS — R1312 Dysphagia, oropharyngeal phase: Secondary | ICD-10-CM | POA: Diagnosis not present

## 2022-05-07 DIAGNOSIS — G9341 Metabolic encephalopathy: Secondary | ICD-10-CM | POA: Diagnosis not present

## 2022-05-07 DIAGNOSIS — Z7901 Long term (current) use of anticoagulants: Secondary | ICD-10-CM

## 2022-05-07 DIAGNOSIS — Z931 Gastrostomy status: Secondary | ICD-10-CM | POA: Diagnosis not present

## 2022-05-07 DIAGNOSIS — D696 Thrombocytopenia, unspecified: Secondary | ICD-10-CM

## 2022-05-07 DIAGNOSIS — E1165 Type 2 diabetes mellitus with hyperglycemia: Secondary | ICD-10-CM | POA: Diagnosis not present

## 2022-05-07 DIAGNOSIS — E119 Type 2 diabetes mellitus without complications: Secondary | ICD-10-CM | POA: Diagnosis not present

## 2022-05-08 ENCOUNTER — Ambulatory Visit: Payer: Self-pay | Admitting: *Deleted

## 2022-05-08 DIAGNOSIS — Z87440 Personal history of urinary (tract) infections: Secondary | ICD-10-CM | POA: Diagnosis not present

## 2022-05-08 DIAGNOSIS — E1165 Type 2 diabetes mellitus with hyperglycemia: Secondary | ICD-10-CM | POA: Diagnosis not present

## 2022-05-08 DIAGNOSIS — N281 Cyst of kidney, acquired: Secondary | ICD-10-CM | POA: Diagnosis not present

## 2022-05-08 DIAGNOSIS — R1312 Dysphagia, oropharyngeal phase: Secondary | ICD-10-CM | POA: Diagnosis not present

## 2022-05-08 DIAGNOSIS — J690 Pneumonitis due to inhalation of food and vomit: Secondary | ICD-10-CM | POA: Diagnosis not present

## 2022-05-08 DIAGNOSIS — D696 Thrombocytopenia, unspecified: Secondary | ICD-10-CM | POA: Diagnosis not present

## 2022-05-08 DIAGNOSIS — Z7901 Long term (current) use of anticoagulants: Secondary | ICD-10-CM | POA: Diagnosis not present

## 2022-05-08 DIAGNOSIS — Z7984 Long term (current) use of oral hypoglycemic drugs: Secondary | ICD-10-CM | POA: Diagnosis not present

## 2022-05-08 DIAGNOSIS — Z931 Gastrostomy status: Secondary | ICD-10-CM | POA: Diagnosis not present

## 2022-05-08 DIAGNOSIS — I480 Paroxysmal atrial fibrillation: Secondary | ICD-10-CM | POA: Diagnosis not present

## 2022-05-08 NOTE — Patient Outreach (Signed)
  Care Coordination   Care coordination  Visit Note   05/08/2022 Name: [name] MRN: [id number] DOB: 1933-05-01  [name] is a 88 y.o. year old male who sees [name], DO for primary care. I  spoke with a 2 [facility] home health staff members and then Maudie Mercury at [place] infusion [phone] to leave a message for Carolynn Sayers   What matters to the patients health and wellness today?  Dietitian assistance to modify PEG tube feeding to manage prevention of abnormal potassium as discussed with pcp    Goals Addressed               This Visit's Progress     Patient Stated     manage potassium (THN) (pt-stated)        Care Coordination Interventions: Collaborated with Jarome Matin regarding patient administration of potassium possibly via tube feedings E-mail outreach from [employer], dietitian to recommend outreach to patient's home health dietitian unless he is hospitalized  Spoke with a 2 [facility] home health staff members and then Maudie Mercury at [place] infusion [phone] to leave a message for Carolynn Sayers- for dietitian assistance to modify PEG tube feeding to manage prevention of abnormal potassium values as discussed with pcp      wound dressing supplies [facility]) (pt-stated)        Care Coordination Interventions: Noted on 04/11/22 EPIC note in which [facility] RN [name] outreached to pcp about use of Mepilex or DuoDerm as insurance may not cover Mepilex It is noted to able to be purchased at [facility] & [employer] supply ([place]) stores  Outreach to the [employer] and spoke with staff who confirms generally they do not sale enough in the store but can order some if needed. [name] updated         SDOH assessments and interventions completed:  No     Care Coordination Interventions Activated:  Yes  Care Coordination Interventions:  Yes, provided   Follow up plan: Follow up call scheduled for 05/15/22     Encounter  Outcome:  Pt. Visit Completed   Timesha Cervantez L. Lavina Hamman, RN, BSN, [place] Coordinator Office number [phone]

## 2022-05-08 NOTE — Patient Instructions (Signed)
Visit Information  Thank you for taking time to visit with me today. Please don't hesitate to contact me if I can be of assistance to you.   Following are the goals we discussed today:   Goals Addressed               This Visit's Progress     Patient Stated     manage potassium (THN) (pt-stated)        Care Coordination Interventions: Collaborated with Jarome Matin regarding patient administration of potassium possibly via tube feedings E-mail outreach from [employer], dietitian to recommend outreach to patient's home health dietitian unless he is hospitalized  Spoke with a 2 [facility] home health staff members and then Maudie Mercury at [place] infusion [phone] to leave a message for Carolynn Sayers- for dietitian assistance to modify PEG tube feeding to manage prevention of abnormal potassium values as discussed with pcp      wound dressing supplies [facility]) (pt-stated)        Care Coordination Interventions: Noted on 04/11/22 EPIC note in which [facility] RN [name] outreached to pcp about use of Mepilex or DuoDerm as insurance may not cover Mepilex It is noted to able to be purchased at [facility] ([place]) & [employer] supply ([place]) stores  Outreach to the [employer] and spoke with staff who confirms generally they do not sale enough in the store but can order some if needed. [name] updated         Our next appointment is by telephone on 05/15/22 at 2:30 pm   Please call the care guide team at [phone] if you need to cancel or reschedule your appointment.   If you are experiencing a Mental Health or [place] or need someone to talk to, please call the Suicide and Crisis Lifeline: 988 call the [place] National Suicide Prevention Lifeline: (812)255-6587 or TTY: 986-331-6666 TTY 587-771-2484) to talk to a trained counselor call 1-800-273-TALK (toll free, 24 hour hotline) call the [facility]: 772-565-1845 call 911    Patient verbalizes understanding of instructions and care plan provided today and agrees to view in [place]. Active MyChart status and patient understanding of how to access instructions and care plan via MyChart confirmed with patient.     The patient has been provided with contact information for the care management team and has been advised to call with any health related questions or concerns.   [place] Lavina Hamman, RN, BSN, [place] Coordinator Office number [phone]

## 2022-05-08 NOTE — Patient Outreach (Signed)
  Care Coordination   05/08/2022 Name: [name] MRN: [id number] DOB: [birth year]   Care Coordination Outreach Attempts:  An unsuccessful telephone outreach was attempted today to offer the patient information about available care coordination services as a benefit of their health plan.   Follow Up Plan:  Additional outreach attempts will be made to offer the patient care coordination information and services.   Encounter Outcome:  No Answer  Care Coordination Interventions Activated:  No   Care Coordination Interventions:  No, not indicated    SIG Javonta Gronau L. Lavina Hamman, RN, BSN, [place] Coordinator Office number [phone]

## 2022-05-09 DIAGNOSIS — R1312 Dysphagia, oropharyngeal phase: Secondary | ICD-10-CM | POA: Diagnosis not present

## 2022-05-09 DIAGNOSIS — N281 Cyst of kidney, acquired: Secondary | ICD-10-CM | POA: Diagnosis not present

## 2022-05-09 DIAGNOSIS — J690 Pneumonitis due to inhalation of food and vomit: Secondary | ICD-10-CM | POA: Diagnosis not present

## 2022-05-09 DIAGNOSIS — I480 Paroxysmal atrial fibrillation: Secondary | ICD-10-CM | POA: Diagnosis not present

## 2022-05-09 DIAGNOSIS — E1165 Type 2 diabetes mellitus with hyperglycemia: Secondary | ICD-10-CM | POA: Diagnosis not present

## 2022-05-09 DIAGNOSIS — D696 Thrombocytopenia, unspecified: Secondary | ICD-10-CM | POA: Diagnosis not present

## 2022-05-09 DIAGNOSIS — Z87440 Personal history of urinary (tract) infections: Secondary | ICD-10-CM | POA: Diagnosis not present

## 2022-05-09 DIAGNOSIS — Z7984 Long term (current) use of oral hypoglycemic drugs: Secondary | ICD-10-CM | POA: Diagnosis not present

## 2022-05-09 DIAGNOSIS — Z931 Gastrostomy status: Secondary | ICD-10-CM | POA: Diagnosis not present

## 2022-05-09 DIAGNOSIS — Z7901 Long term (current) use of anticoagulants: Secondary | ICD-10-CM | POA: Diagnosis not present

## 2022-05-13 ENCOUNTER — Other Ambulatory Visit: Payer: Self-pay | Admitting: Family Medicine

## 2022-05-13 DIAGNOSIS — D696 Thrombocytopenia, unspecified: Secondary | ICD-10-CM | POA: Diagnosis not present

## 2022-05-13 DIAGNOSIS — L89153 Pressure ulcer of sacral region, stage 3: Secondary | ICD-10-CM | POA: Diagnosis not present

## 2022-05-13 DIAGNOSIS — I480 Paroxysmal atrial fibrillation: Secondary | ICD-10-CM | POA: Diagnosis not present

## 2022-05-13 DIAGNOSIS — Z7984 Long term (current) use of oral hypoglycemic drugs: Secondary | ICD-10-CM | POA: Diagnosis not present

## 2022-05-13 DIAGNOSIS — I69359 Hemiplegia and hemiparesis following cerebral infarction affecting unspecified side: Secondary | ICD-10-CM | POA: Diagnosis not present

## 2022-05-13 DIAGNOSIS — Z931 Gastrostomy status: Secondary | ICD-10-CM | POA: Diagnosis not present

## 2022-05-13 DIAGNOSIS — Z87440 Personal history of urinary (tract) infections: Secondary | ICD-10-CM | POA: Diagnosis not present

## 2022-05-13 DIAGNOSIS — J690 Pneumonitis due to inhalation of food and vomit: Secondary | ICD-10-CM | POA: Diagnosis not present

## 2022-05-13 DIAGNOSIS — E1165 Type 2 diabetes mellitus with hyperglycemia: Secondary | ICD-10-CM | POA: Diagnosis not present

## 2022-05-13 DIAGNOSIS — N281 Cyst of kidney, acquired: Secondary | ICD-10-CM | POA: Diagnosis not present

## 2022-05-13 DIAGNOSIS — Z7901 Long term (current) use of anticoagulants: Secondary | ICD-10-CM | POA: Diagnosis not present

## 2022-05-13 DIAGNOSIS — R1312 Dysphagia, oropharyngeal phase: Secondary | ICD-10-CM | POA: Diagnosis not present

## 2022-05-13 DIAGNOSIS — F321 Major depressive disorder, single episode, moderate: Secondary | ICD-10-CM

## 2022-05-15 ENCOUNTER — Ambulatory Visit: Payer: Self-pay | Admitting: *Deleted

## 2022-05-15 DIAGNOSIS — I480 Paroxysmal atrial fibrillation: Secondary | ICD-10-CM | POA: Diagnosis not present

## 2022-05-15 DIAGNOSIS — Z7901 Long term (current) use of anticoagulants: Secondary | ICD-10-CM | POA: Diagnosis not present

## 2022-05-15 DIAGNOSIS — Z931 Gastrostomy status: Secondary | ICD-10-CM | POA: Diagnosis not present

## 2022-05-15 DIAGNOSIS — J690 Pneumonitis due to inhalation of food and vomit: Secondary | ICD-10-CM | POA: Diagnosis not present

## 2022-05-15 DIAGNOSIS — N281 Cyst of kidney, acquired: Secondary | ICD-10-CM | POA: Diagnosis not present

## 2022-05-15 DIAGNOSIS — D696 Thrombocytopenia, unspecified: Secondary | ICD-10-CM | POA: Diagnosis not present

## 2022-05-15 DIAGNOSIS — R1312 Dysphagia, oropharyngeal phase: Secondary | ICD-10-CM | POA: Diagnosis not present

## 2022-05-15 DIAGNOSIS — Z87440 Personal history of urinary (tract) infections: Secondary | ICD-10-CM | POA: Diagnosis not present

## 2022-05-15 DIAGNOSIS — Z7984 Long term (current) use of oral hypoglycemic drugs: Secondary | ICD-10-CM | POA: Diagnosis not present

## 2022-05-15 DIAGNOSIS — E1165 Type 2 diabetes mellitus with hyperglycemia: Secondary | ICD-10-CM | POA: Diagnosis not present

## 2022-05-15 NOTE — Patient Outreach (Signed)
  Care Coordination   Follow Up Visit Note   05/17/2022 Name: [name] MRN: [id number] DOB: 1933-06-05  [name] is a 88 y.o. year old male who sees [name], DO for primary care. I spoke with  daughters ([name] & [name]) of [name] by phone today. He was reported having confusion today  What matters to the patients health and wellness today? Lack of sleep & increased confusion related to poor neck pain management,  Daughter. [name] (nurse) does not believe the pain is related to atrial fibrillation or other cardiac illness. [name] reports use of Voltaren. Lidocaine, rest, heat and Tylenol for the neck pain without relief. [name] voices interest in an ultra sound guided neck injection (not steroids) for the neck pain. [name] shared information about Dr Benjamine Mola Deterding as an example of a provider for a referral to this.  She voices she is aware that this provider completes these intervention.    Goals Addressed               This Visit's Progress     Patient Stated     manage potassium (THN) (pt-stated)        Care Coordination Interventions: Collaborated with [name] regarding patient administration of potassium possibly via tube feedings E-mail outreach from [employer], dietitian to recommend outreach to patient's home health dietitian unless he is hospitalized  Discussed with pt daughters that RN CM spoke with 2 [facility] home health staff members and then [name] at [place] infusion [phone] to leave a message for [name]- for dietitian assistance to modify PEG tube feeding to manage prevention of abnormal potassium values as discussed with pcp. Discussed with the daughters that [name] does not have a dietitian that could offer assist to provide some extra potassium into his feeds that may allow discontinuation of the supplement as discussed by pcp.  Discussed the continuation of the supplement.This still remains pending as [facility] does not  have a dietitian. Updated pcp via in basket message      pain management (THN) (pt-stated)   Not on track     Care Coordination Interventions: Assessed the effectiveness of the prescribed Lidocaine patches and Voltaren gel for [name] neck pain. Confirmed with [name], daughter that these interventions plus others are not providing relief  Active listening as [name], daughter, shared an interest of using an ultrasound guide injection for neck pain  Outreach to Dr Lajuana Ripple about this unresolved pain with discussion of shared information by [name] vis EPIC in basket          SDOH assessments and interventions completed:  No     Care Coordination Interventions Activated:  Yes  Care Coordination Interventions:  Yes, provided   Follow up plan: Follow up call scheduled for 06/14/22    Encounter Outcome:  Pt. Visit Completed

## 2022-05-15 NOTE — Patient Outreach (Signed)
  Care Coordination   05/15/2022 Name: [name] MRN: [id number] DOB: September 15, 1932   Care Coordination Outreach Attempts:  An unsuccessful telephone outreach was attempted today to offer the patient information about available care coordination services as a benefit of their health plan.   Follow Up Plan:  Additional outreach attempts will be made to offer the patient care coordination information and services.   Encounter Outcome:  Pt. Request to Call Back  Care Coordination Interventions Activated:  No   Care Coordination Interventions:  No, not indicated    SIG Aubriee Szeto L. Lavina Hamman, RN, BSN, [place] Coordinator Office number [phone]

## 2022-05-16 DIAGNOSIS — Z931 Gastrostomy status: Secondary | ICD-10-CM | POA: Diagnosis not present

## 2022-05-16 DIAGNOSIS — Z87440 Personal history of urinary (tract) infections: Secondary | ICD-10-CM | POA: Diagnosis not present

## 2022-05-16 DIAGNOSIS — N281 Cyst of kidney, acquired: Secondary | ICD-10-CM | POA: Diagnosis not present

## 2022-05-16 DIAGNOSIS — R1312 Dysphagia, oropharyngeal phase: Secondary | ICD-10-CM | POA: Diagnosis not present

## 2022-05-16 DIAGNOSIS — Z7984 Long term (current) use of oral hypoglycemic drugs: Secondary | ICD-10-CM | POA: Diagnosis not present

## 2022-05-16 DIAGNOSIS — D696 Thrombocytopenia, unspecified: Secondary | ICD-10-CM | POA: Diagnosis not present

## 2022-05-16 DIAGNOSIS — I480 Paroxysmal atrial fibrillation: Secondary | ICD-10-CM | POA: Diagnosis not present

## 2022-05-16 DIAGNOSIS — J690 Pneumonitis due to inhalation of food and vomit: Secondary | ICD-10-CM | POA: Diagnosis not present

## 2022-05-16 DIAGNOSIS — Z7901 Long term (current) use of anticoagulants: Secondary | ICD-10-CM | POA: Diagnosis not present

## 2022-05-16 DIAGNOSIS — E1165 Type 2 diabetes mellitus with hyperglycemia: Secondary | ICD-10-CM | POA: Diagnosis not present

## 2022-05-16 NOTE — Patient Outreach (Incomplete)
  Care Coordination   Follow Up Visit Note   05/15/2022 Name: KARO ROG MRN: [id number] DOB: February 28, 1933  HALL BIRCHARD is a 88 y.o. year old male who sees [name], DO for primary care. I spoke with  Clifton Custard by phone today.  What matters to the patients health and wellness today?  Pain management    Goals Addressed   None     SDOH assessments and interventions completed:  Yes{THN Tip this will not be part of the note when signed-REQUIRED REPORT FIELD DO NOT DELETE (Optional):27901}     Care Coordination Interventions Activated:  Yes {THN Tip this will not be part of the note when signed-REQUIRED REPORT FIELD DO NOT DELETE (Optional):27901} Care Coordination Interventions:  Yes, provided {THN Tip this will not be part of the note when signed-REQUIRED REPORT FIELD DO NOT DELETE (Optional):27901}  Follow up plan: Follow up call scheduled for ***   Encounter Outcome:  Pt. Visit Completed {THN Tip this will not be part of the note when signed-REQUIRED REPORT FIELD DO NOT DELETE (Optional):27901}

## 2022-05-17 NOTE — Patient Instructions (Signed)
Visit Information  Thank you for taking time to visit with me today. Please don't hesitate to contact me if I can be of assistance to you.   Following are the goals we discussed today:   Goals Addressed               This Visit's Progress     Patient Stated     manage potassium (THN) (pt-stated)        Care Coordination Interventions: Collaborated with Jarome Matin regarding patient administration of potassium possibly via tube feedings E-mail outreach from [employer], dietitian to recommend outreach to patient's home health dietitian unless he is hospitalized  Discussed with pt daughters that RN CM spoke with 2 [facility] home health staff members and then Maudie Mercury at [place] infusion [phone] to leave a message for Carolynn Sayers- for dietitian assistance to modify PEG tube feeding to manage prevention of abnormal potassium values as discussed with pcp. Discussed with the daughters that Latricia Heft does not have a dietitian that could offer assist to provide some extra potassium into his feeds that may allow discontinuation of the supplement as discussed by pcp.  Discussed the continuation of the supplement.This still remains pending as [facility] does not have a dietitian. Updated pcp via in basket message      pain management (THN) (pt-stated)   Not on track     Care Coordination Interventions: Assessed the effectiveness of the prescribed Lidocaine patches and Voltaren gel for [name] neck pain. Confirmed with [name], daughter that these interventions plus others are not providing relief  Active listening as [name], daughter, shared an interest of using an ultrasound guide injection for neck pain  Outreach to Dr Lajuana Ripple about this unresolved pain with discussion of shared information by [name] vis EPIC in basket          Our next appointment is by telephone on 06/14/22 at 3 pm  Please call the care guide team at [phone] if you need to cancel or reschedule your appointment.    If you are experiencing a Mental Health or [place] or need someone to talk to, please call the Suicide and Crisis Lifeline: 988 call the [place] National Suicide Prevention Lifeline: 819 857 0326 or TTY: 205 265 7290 TTY 267-631-4155) to talk to a trained counselor call 1-800-273-TALK (toll free, 24 hour hotline) call the [facility]: [phone] call 911   Patient verbalizes understanding of instructions and care plan provided today and agrees to view in [place]. Active MyChart status and patient understanding of how to access instructions and care plan via MyChart confirmed with patient.     The patient has been provided with contact information for the care management team and has been advised to call with any health related questions or concerns.   [place] Lavina Hamman, RN, BSN, [place] Coordinator Office number [phone]

## 2022-05-20 DIAGNOSIS — Z7984 Long term (current) use of oral hypoglycemic drugs: Secondary | ICD-10-CM | POA: Diagnosis not present

## 2022-05-20 DIAGNOSIS — I480 Paroxysmal atrial fibrillation: Secondary | ICD-10-CM | POA: Diagnosis not present

## 2022-05-20 DIAGNOSIS — J690 Pneumonitis due to inhalation of food and vomit: Secondary | ICD-10-CM | POA: Diagnosis not present

## 2022-05-20 DIAGNOSIS — R1310 Dysphagia, unspecified: Secondary | ICD-10-CM | POA: Diagnosis not present

## 2022-05-20 DIAGNOSIS — Z7901 Long term (current) use of anticoagulants: Secondary | ICD-10-CM | POA: Diagnosis not present

## 2022-05-20 DIAGNOSIS — R1312 Dysphagia, oropharyngeal phase: Secondary | ICD-10-CM | POA: Diagnosis not present

## 2022-05-20 DIAGNOSIS — N281 Cyst of kidney, acquired: Secondary | ICD-10-CM | POA: Diagnosis not present

## 2022-05-20 DIAGNOSIS — Z931 Gastrostomy status: Secondary | ICD-10-CM | POA: Diagnosis not present

## 2022-05-20 DIAGNOSIS — D696 Thrombocytopenia, unspecified: Secondary | ICD-10-CM | POA: Diagnosis not present

## 2022-05-20 DIAGNOSIS — E43 Unspecified severe protein-calorie malnutrition: Secondary | ICD-10-CM | POA: Diagnosis not present

## 2022-05-20 DIAGNOSIS — E1165 Type 2 diabetes mellitus with hyperglycemia: Secondary | ICD-10-CM | POA: Diagnosis not present

## 2022-05-20 DIAGNOSIS — G9341 Metabolic encephalopathy: Secondary | ICD-10-CM | POA: Diagnosis not present

## 2022-05-20 DIAGNOSIS — E119 Type 2 diabetes mellitus without complications: Secondary | ICD-10-CM | POA: Diagnosis not present

## 2022-05-20 DIAGNOSIS — Z87440 Personal history of urinary (tract) infections: Secondary | ICD-10-CM | POA: Diagnosis not present

## 2022-05-22 DIAGNOSIS — Z7901 Long term (current) use of anticoagulants: Secondary | ICD-10-CM | POA: Diagnosis not present

## 2022-05-22 DIAGNOSIS — Z87440 Personal history of urinary (tract) infections: Secondary | ICD-10-CM | POA: Diagnosis not present

## 2022-05-22 DIAGNOSIS — I480 Paroxysmal atrial fibrillation: Secondary | ICD-10-CM | POA: Diagnosis not present

## 2022-05-22 DIAGNOSIS — Z7984 Long term (current) use of oral hypoglycemic drugs: Secondary | ICD-10-CM | POA: Diagnosis not present

## 2022-05-22 DIAGNOSIS — E1165 Type 2 diabetes mellitus with hyperglycemia: Secondary | ICD-10-CM | POA: Diagnosis not present

## 2022-05-22 DIAGNOSIS — D696 Thrombocytopenia, unspecified: Secondary | ICD-10-CM | POA: Diagnosis not present

## 2022-05-22 DIAGNOSIS — Z931 Gastrostomy status: Secondary | ICD-10-CM | POA: Diagnosis not present

## 2022-05-22 DIAGNOSIS — N281 Cyst of kidney, acquired: Secondary | ICD-10-CM | POA: Diagnosis not present

## 2022-05-22 DIAGNOSIS — R1312 Dysphagia, oropharyngeal phase: Secondary | ICD-10-CM | POA: Diagnosis not present

## 2022-05-22 DIAGNOSIS — J690 Pneumonitis due to inhalation of food and vomit: Secondary | ICD-10-CM | POA: Diagnosis not present

## 2022-05-23 ENCOUNTER — Other Ambulatory Visit: Payer: Self-pay | Admitting: Family Medicine

## 2022-05-23 DIAGNOSIS — M479 Spondylosis, unspecified: Secondary | ICD-10-CM

## 2022-05-26 ENCOUNTER — Encounter: Payer: Self-pay | Admitting: Family Medicine

## 2022-05-26 ENCOUNTER — Ambulatory Visit (INDEPENDENT_AMBULATORY_CARE_PROVIDER_SITE_OTHER): Payer: Medicare Other | Admitting: Family Medicine

## 2022-05-26 VITALS — BP 105/58 | HR 88 | Temp 97.8°F | Ht 71.0 in

## 2022-05-26 DIAGNOSIS — E119 Type 2 diabetes mellitus without complications: Secondary | ICD-10-CM | POA: Diagnosis not present

## 2022-05-26 DIAGNOSIS — G9341 Metabolic encephalopathy: Secondary | ICD-10-CM | POA: Diagnosis not present

## 2022-05-26 DIAGNOSIS — J4489 Other specified chronic obstructive pulmonary disease: Secondary | ICD-10-CM

## 2022-05-26 DIAGNOSIS — M25512 Pain in left shoulder: Secondary | ICD-10-CM

## 2022-05-26 DIAGNOSIS — Z23 Encounter for immunization: Secondary | ICD-10-CM

## 2022-05-26 DIAGNOSIS — I480 Paroxysmal atrial fibrillation: Secondary | ICD-10-CM

## 2022-05-26 DIAGNOSIS — N1831 Chronic kidney disease, stage 3a: Secondary | ICD-10-CM | POA: Diagnosis not present

## 2022-05-26 DIAGNOSIS — G8929 Other chronic pain: Secondary | ICD-10-CM | POA: Diagnosis not present

## 2022-05-26 DIAGNOSIS — Z931 Gastrostomy status: Secondary | ICD-10-CM

## 2022-05-26 DIAGNOSIS — R1310 Dysphagia, unspecified: Secondary | ICD-10-CM | POA: Diagnosis not present

## 2022-05-26 DIAGNOSIS — Z9189 Other specified personal risk factors, not elsewhere classified: Secondary | ICD-10-CM | POA: Diagnosis not present

## 2022-05-26 MED ORDER — ONDANSETRON 8 MG PO TBDP: 8.0000 mg | ORAL_TABLET | Freq: Three times a day (TID) | ORAL | 3 refills | Status: DC | PRN

## 2022-05-26 MED ORDER — METHYLPREDNISOLONE ACETATE 40 MG/ML IJ SUSP
40.0000 mg | Freq: Once | INTRAMUSCULAR | Status: AC
  Administered 2022-05-26: 40 mg via INTRAMUSCULAR

## 2022-05-26 MED ORDER — BUDESONIDE-FORMOTEROL FUMARATE 160-4.5 MCG/ACT IN AERO: 2.0000 | INHALATION_SPRAY | Freq: Two times a day (BID) | RESPIRATORY_TRACT | 5 refills | Status: DC

## 2022-05-26 MED ORDER — ZINC OXIDE 12.8 % EX OINT: TOPICAL_OINTMENT | Freq: Two times a day (BID) | CUTANEOUS | 0 refills | Status: DC

## 2022-05-26 MED ORDER — PANTOPRAZOLE SODIUM 20 MG PO TBEC: 40.0000 mg | DELAYED_RELEASE_TABLET | Freq: Every morning | ORAL | 3 refills | Status: DC

## 2022-05-26 NOTE — Patient Instructions (Signed)
Ok to back down on feeds/ potassium supplement if increasing oral feeds.  Plan for BMP with nurse in 1 week to ensure stability of potassium
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
## 2022-05-27 DIAGNOSIS — Z931 Gastrostomy status: Secondary | ICD-10-CM | POA: Diagnosis not present

## 2022-05-27 DIAGNOSIS — Z7901 Long term (current) use of anticoagulants: Secondary | ICD-10-CM | POA: Diagnosis not present

## 2022-05-27 DIAGNOSIS — D696 Thrombocytopenia, unspecified: Secondary | ICD-10-CM | POA: Diagnosis not present

## 2022-05-27 DIAGNOSIS — R1312 Dysphagia, oropharyngeal phase: Secondary | ICD-10-CM | POA: Diagnosis not present

## 2022-05-27 DIAGNOSIS — Z87440 Personal history of urinary (tract) infections: Secondary | ICD-10-CM | POA: Diagnosis not present

## 2022-05-27 DIAGNOSIS — E1165 Type 2 diabetes mellitus with hyperglycemia: Secondary | ICD-10-CM | POA: Diagnosis not present

## 2022-05-27 DIAGNOSIS — J690 Pneumonitis due to inhalation of food and vomit: Secondary | ICD-10-CM | POA: Diagnosis not present

## 2022-05-27 DIAGNOSIS — N281 Cyst of kidney, acquired: Secondary | ICD-10-CM | POA: Diagnosis not present

## 2022-05-27 DIAGNOSIS — I480 Paroxysmal atrial fibrillation: Secondary | ICD-10-CM | POA: Diagnosis not present

## 2022-05-27 DIAGNOSIS — Z7984 Long term (current) use of oral hypoglycemic drugs: Secondary | ICD-10-CM | POA: Diagnosis not present

## 2022-05-27 LAB — CBC
Hematocrit: 31.7 % — ABNORMAL LOW (ref 37.5–51.0)
Hemoglobin: 10 g/dL — ABNORMAL LOW (ref 13.0–17.7)
MCH: 28.2 pg (ref 26.6–33.0)
MCHC: 31.5 g/dL (ref 31.5–35.7)
MCV: 90 fL (ref 79–97)
Platelets: 101 10*3/uL — ABNORMAL LOW (ref 150–450)
RBC: 3.54 x10E6/uL — ABNORMAL LOW (ref 4.14–5.80)
RDW: 18.6 % — ABNORMAL HIGH (ref 11.6–15.4)
WBC: 6.1 10*3/uL (ref 3.4–10.8)

## 2022-05-27 LAB — CMP14+EGFR
ALT: 20 IU/L (ref 0–44)
AST: 21 IU/L (ref 0–40)
Albumin/Globulin Ratio: 1.3 (ref 1.2–2.2)
Albumin: 3.1 g/dL — ABNORMAL LOW (ref 3.7–4.7)
Alkaline Phosphatase: 105 IU/L (ref 44–121)
BUN/Creatinine Ratio: 36 — ABNORMAL HIGH (ref 10–24)
BUN: 27 mg/dL (ref 8–27)
Bilirubin Total: 0.5 mg/dL (ref 0.0–1.2)
CO2: 22 mmol/L (ref 20–29)
Calcium: 9.9 mg/dL (ref 8.6–10.2)
Chloride: 98 mmol/L (ref 96–106)
Creatinine, Ser: 0.74 mg/dL — ABNORMAL LOW (ref 0.76–1.27)
Globulin, Total: 2.3 g/dL (ref 1.5–4.5)
Glucose: 148 mg/dL — ABNORMAL HIGH (ref 70–99)
Potassium: 4.8 mmol/L (ref 3.5–5.2)
Sodium: 135 mmol/L (ref 134–144)
Total Protein: 5.4 g/dL — ABNORMAL LOW (ref 6.0–8.5)
eGFR: 88 mL/min/{1.73_m2} (ref >59)

## 2022-05-27 LAB — TSH: TSH: 2.2 u[IU]/mL (ref 0.450–4.500)

## 2022-05-28 ENCOUNTER — Telehealth: Payer: Self-pay | Admitting: Family Medicine

## 2022-05-28 DIAGNOSIS — I480 Paroxysmal atrial fibrillation: Secondary | ICD-10-CM | POA: Diagnosis not present

## 2022-05-28 DIAGNOSIS — Z7901 Long term (current) use of anticoagulants: Secondary | ICD-10-CM | POA: Diagnosis not present

## 2022-05-28 DIAGNOSIS — D696 Thrombocytopenia, unspecified: Secondary | ICD-10-CM | POA: Diagnosis not present

## 2022-05-28 DIAGNOSIS — R1312 Dysphagia, oropharyngeal phase: Secondary | ICD-10-CM | POA: Diagnosis not present

## 2022-05-28 DIAGNOSIS — E1165 Type 2 diabetes mellitus with hyperglycemia: Secondary | ICD-10-CM | POA: Diagnosis not present

## 2022-05-28 DIAGNOSIS — J690 Pneumonitis due to inhalation of food and vomit: Secondary | ICD-10-CM | POA: Diagnosis not present

## 2022-05-28 DIAGNOSIS — Z7984 Long term (current) use of oral hypoglycemic drugs: Secondary | ICD-10-CM | POA: Diagnosis not present

## 2022-05-28 DIAGNOSIS — Z87440 Personal history of urinary (tract) infections: Secondary | ICD-10-CM | POA: Diagnosis not present

## 2022-05-28 DIAGNOSIS — N281 Cyst of kidney, acquired: Secondary | ICD-10-CM | POA: Diagnosis not present

## 2022-05-28 DIAGNOSIS — Z931 Gastrostomy status: Secondary | ICD-10-CM | POA: Diagnosis not present

## 2022-05-28 NOTE — Progress Notes (Signed)
TC to [facility] [phone] requesting BMP to be drawn next week.

## 2022-05-28 NOTE — Telephone Encounter (Signed)
TC back to [facility], VO given to add on nursing for labs & wound care for peg tube, he has been having infections and they have been applying mirapex.

## 2022-05-29 DIAGNOSIS — R1310 Dysphagia, unspecified: Secondary | ICD-10-CM | POA: Diagnosis not present

## 2022-05-29 DIAGNOSIS — E1165 Type 2 diabetes mellitus with hyperglycemia: Secondary | ICD-10-CM | POA: Diagnosis not present

## 2022-05-29 DIAGNOSIS — Z7901 Long term (current) use of anticoagulants: Secondary | ICD-10-CM | POA: Diagnosis not present

## 2022-05-29 DIAGNOSIS — D696 Thrombocytopenia, unspecified: Secondary | ICD-10-CM | POA: Diagnosis not present

## 2022-05-29 DIAGNOSIS — Z931 Gastrostomy status: Secondary | ICD-10-CM | POA: Diagnosis not present

## 2022-05-29 DIAGNOSIS — J690 Pneumonitis due to inhalation of food and vomit: Secondary | ICD-10-CM | POA: Diagnosis not present

## 2022-05-29 DIAGNOSIS — I480 Paroxysmal atrial fibrillation: Secondary | ICD-10-CM | POA: Diagnosis not present

## 2022-05-29 DIAGNOSIS — R1312 Dysphagia, oropharyngeal phase: Secondary | ICD-10-CM | POA: Diagnosis not present

## 2022-05-29 DIAGNOSIS — N281 Cyst of kidney, acquired: Secondary | ICD-10-CM | POA: Diagnosis not present

## 2022-05-29 DIAGNOSIS — Z87440 Personal history of urinary (tract) infections: Secondary | ICD-10-CM | POA: Diagnosis not present

## 2022-05-29 DIAGNOSIS — Z7984 Long term (current) use of oral hypoglycemic drugs: Secondary | ICD-10-CM | POA: Diagnosis not present

## 2022-06-03 DIAGNOSIS — D696 Thrombocytopenia, unspecified: Secondary | ICD-10-CM | POA: Diagnosis not present

## 2022-06-03 DIAGNOSIS — Z7901 Long term (current) use of anticoagulants: Secondary | ICD-10-CM | POA: Diagnosis not present

## 2022-06-03 DIAGNOSIS — Z7984 Long term (current) use of oral hypoglycemic drugs: Secondary | ICD-10-CM | POA: Diagnosis not present

## 2022-06-03 DIAGNOSIS — Z87440 Personal history of urinary (tract) infections: Secondary | ICD-10-CM | POA: Diagnosis not present

## 2022-06-03 DIAGNOSIS — Z931 Gastrostomy status: Secondary | ICD-10-CM | POA: Diagnosis not present

## 2022-06-03 DIAGNOSIS — N281 Cyst of kidney, acquired: Secondary | ICD-10-CM | POA: Diagnosis not present

## 2022-06-03 DIAGNOSIS — E1165 Type 2 diabetes mellitus with hyperglycemia: Secondary | ICD-10-CM | POA: Diagnosis not present

## 2022-06-03 DIAGNOSIS — J690 Pneumonitis due to inhalation of food and vomit: Secondary | ICD-10-CM | POA: Diagnosis not present

## 2022-06-03 DIAGNOSIS — R1312 Dysphagia, oropharyngeal phase: Secondary | ICD-10-CM | POA: Diagnosis not present

## 2022-06-03 DIAGNOSIS — I480 Paroxysmal atrial fibrillation: Secondary | ICD-10-CM | POA: Diagnosis not present

## 2022-06-05 DIAGNOSIS — E1165 Type 2 diabetes mellitus with hyperglycemia: Secondary | ICD-10-CM | POA: Diagnosis not present

## 2022-06-05 DIAGNOSIS — Z87440 Personal history of urinary (tract) infections: Secondary | ICD-10-CM | POA: Diagnosis not present

## 2022-06-05 DIAGNOSIS — Z7901 Long term (current) use of anticoagulants: Secondary | ICD-10-CM | POA: Diagnosis not present

## 2022-06-05 DIAGNOSIS — N281 Cyst of kidney, acquired: Secondary | ICD-10-CM | POA: Diagnosis not present

## 2022-06-05 DIAGNOSIS — R1312 Dysphagia, oropharyngeal phase: Secondary | ICD-10-CM | POA: Diagnosis not present

## 2022-06-05 DIAGNOSIS — J690 Pneumonitis due to inhalation of food and vomit: Secondary | ICD-10-CM | POA: Diagnosis not present

## 2022-06-05 DIAGNOSIS — Z7984 Long term (current) use of oral hypoglycemic drugs: Secondary | ICD-10-CM | POA: Diagnosis not present

## 2022-06-05 DIAGNOSIS — D696 Thrombocytopenia, unspecified: Secondary | ICD-10-CM | POA: Diagnosis not present

## 2022-06-05 DIAGNOSIS — Z931 Gastrostomy status: Secondary | ICD-10-CM | POA: Diagnosis not present

## 2022-06-05 DIAGNOSIS — I480 Paroxysmal atrial fibrillation: Secondary | ICD-10-CM | POA: Diagnosis not present

## 2022-06-06 DIAGNOSIS — Z7901 Long term (current) use of anticoagulants: Secondary | ICD-10-CM | POA: Diagnosis not present

## 2022-06-06 DIAGNOSIS — Z87440 Personal history of urinary (tract) infections: Secondary | ICD-10-CM | POA: Diagnosis not present

## 2022-06-06 DIAGNOSIS — I480 Paroxysmal atrial fibrillation: Secondary | ICD-10-CM | POA: Diagnosis not present

## 2022-06-06 DIAGNOSIS — G9341 Metabolic encephalopathy: Secondary | ICD-10-CM | POA: Diagnosis not present

## 2022-06-06 DIAGNOSIS — R1312 Dysphagia, oropharyngeal phase: Secondary | ICD-10-CM | POA: Diagnosis not present

## 2022-06-06 DIAGNOSIS — E43 Unspecified severe protein-calorie malnutrition: Secondary | ICD-10-CM | POA: Diagnosis not present

## 2022-06-06 DIAGNOSIS — N281 Cyst of kidney, acquired: Secondary | ICD-10-CM | POA: Diagnosis not present

## 2022-06-06 DIAGNOSIS — E119 Type 2 diabetes mellitus without complications: Secondary | ICD-10-CM | POA: Diagnosis not present

## 2022-06-06 DIAGNOSIS — J690 Pneumonitis due to inhalation of food and vomit: Secondary | ICD-10-CM | POA: Diagnosis not present

## 2022-06-06 DIAGNOSIS — R1310 Dysphagia, unspecified: Secondary | ICD-10-CM | POA: Diagnosis not present

## 2022-06-06 DIAGNOSIS — E1165 Type 2 diabetes mellitus with hyperglycemia: Secondary | ICD-10-CM | POA: Diagnosis not present

## 2022-06-06 DIAGNOSIS — Z931 Gastrostomy status: Secondary | ICD-10-CM | POA: Diagnosis not present

## 2022-06-06 DIAGNOSIS — Z7984 Long term (current) use of oral hypoglycemic drugs: Secondary | ICD-10-CM | POA: Diagnosis not present

## 2022-06-06 DIAGNOSIS — D696 Thrombocytopenia, unspecified: Secondary | ICD-10-CM | POA: Diagnosis not present

## 2022-06-09 ENCOUNTER — Other Ambulatory Visit: Payer: Self-pay | Admitting: Family Medicine

## 2022-06-09 DIAGNOSIS — R1310 Dysphagia, unspecified: Secondary | ICD-10-CM | POA: Diagnosis not present

## 2022-06-09 DIAGNOSIS — Z7984 Long term (current) use of oral hypoglycemic drugs: Secondary | ICD-10-CM | POA: Diagnosis not present

## 2022-06-09 DIAGNOSIS — E1165 Type 2 diabetes mellitus with hyperglycemia: Secondary | ICD-10-CM | POA: Diagnosis not present

## 2022-06-09 DIAGNOSIS — D696 Thrombocytopenia, unspecified: Secondary | ICD-10-CM | POA: Diagnosis not present

## 2022-06-09 DIAGNOSIS — Z87440 Personal history of urinary (tract) infections: Secondary | ICD-10-CM | POA: Diagnosis not present

## 2022-06-09 DIAGNOSIS — R1312 Dysphagia, oropharyngeal phase: Secondary | ICD-10-CM | POA: Diagnosis not present

## 2022-06-09 DIAGNOSIS — Z7901 Long term (current) use of anticoagulants: Secondary | ICD-10-CM | POA: Diagnosis not present

## 2022-06-09 DIAGNOSIS — J690 Pneumonitis due to inhalation of food and vomit: Secondary | ICD-10-CM | POA: Diagnosis not present

## 2022-06-09 DIAGNOSIS — I480 Paroxysmal atrial fibrillation: Secondary | ICD-10-CM | POA: Diagnosis not present

## 2022-06-09 DIAGNOSIS — Z931 Gastrostomy status: Secondary | ICD-10-CM | POA: Diagnosis not present

## 2022-06-09 DIAGNOSIS — N281 Cyst of kidney, acquired: Secondary | ICD-10-CM | POA: Diagnosis not present

## 2022-06-09 DIAGNOSIS — E1142 Type 2 diabetes mellitus with diabetic polyneuropathy: Secondary | ICD-10-CM

## 2022-06-10 DIAGNOSIS — J690 Pneumonitis due to inhalation of food and vomit: Secondary | ICD-10-CM | POA: Diagnosis not present

## 2022-06-10 DIAGNOSIS — Z931 Gastrostomy status: Secondary | ICD-10-CM | POA: Diagnosis not present

## 2022-06-10 DIAGNOSIS — Z7984 Long term (current) use of oral hypoglycemic drugs: Secondary | ICD-10-CM | POA: Diagnosis not present

## 2022-06-10 DIAGNOSIS — N281 Cyst of kidney, acquired: Secondary | ICD-10-CM | POA: Diagnosis not present

## 2022-06-10 DIAGNOSIS — E1165 Type 2 diabetes mellitus with hyperglycemia: Secondary | ICD-10-CM | POA: Diagnosis not present

## 2022-06-10 DIAGNOSIS — I480 Paroxysmal atrial fibrillation: Secondary | ICD-10-CM | POA: Diagnosis not present

## 2022-06-10 DIAGNOSIS — Z7901 Long term (current) use of anticoagulants: Secondary | ICD-10-CM | POA: Diagnosis not present

## 2022-06-10 DIAGNOSIS — R1312 Dysphagia, oropharyngeal phase: Secondary | ICD-10-CM | POA: Diagnosis not present

## 2022-06-10 DIAGNOSIS — D696 Thrombocytopenia, unspecified: Secondary | ICD-10-CM | POA: Diagnosis not present

## 2022-06-10 DIAGNOSIS — Z87440 Personal history of urinary (tract) infections: Secondary | ICD-10-CM | POA: Diagnosis not present

## 2022-06-11 ENCOUNTER — Other Ambulatory Visit: Payer: Self-pay | Admitting: Family Medicine

## 2022-06-11 DIAGNOSIS — N281 Cyst of kidney, acquired: Secondary | ICD-10-CM | POA: Diagnosis not present

## 2022-06-11 DIAGNOSIS — E1165 Type 2 diabetes mellitus with hyperglycemia: Secondary | ICD-10-CM | POA: Diagnosis not present

## 2022-06-11 DIAGNOSIS — Z931 Gastrostomy status: Secondary | ICD-10-CM | POA: Diagnosis not present

## 2022-06-11 DIAGNOSIS — Z87440 Personal history of urinary (tract) infections: Secondary | ICD-10-CM | POA: Diagnosis not present

## 2022-06-11 DIAGNOSIS — I480 Paroxysmal atrial fibrillation: Secondary | ICD-10-CM | POA: Diagnosis not present

## 2022-06-11 DIAGNOSIS — J690 Pneumonitis due to inhalation of food and vomit: Secondary | ICD-10-CM | POA: Diagnosis not present

## 2022-06-11 DIAGNOSIS — Z7901 Long term (current) use of anticoagulants: Secondary | ICD-10-CM | POA: Diagnosis not present

## 2022-06-11 DIAGNOSIS — D696 Thrombocytopenia, unspecified: Secondary | ICD-10-CM | POA: Diagnosis not present

## 2022-06-11 DIAGNOSIS — Z7984 Long term (current) use of oral hypoglycemic drugs: Secondary | ICD-10-CM | POA: Diagnosis not present

## 2022-06-11 DIAGNOSIS — R1312 Dysphagia, oropharyngeal phase: Secondary | ICD-10-CM | POA: Diagnosis not present

## 2022-06-12 DIAGNOSIS — J690 Pneumonitis due to inhalation of food and vomit: Secondary | ICD-10-CM | POA: Diagnosis not present

## 2022-06-12 DIAGNOSIS — L89153 Pressure ulcer of sacral region, stage 3: Secondary | ICD-10-CM | POA: Diagnosis not present

## 2022-06-12 DIAGNOSIS — Z7901 Long term (current) use of anticoagulants: Secondary | ICD-10-CM | POA: Diagnosis not present

## 2022-06-12 DIAGNOSIS — Z87440 Personal history of urinary (tract) infections: Secondary | ICD-10-CM | POA: Diagnosis not present

## 2022-06-12 DIAGNOSIS — D696 Thrombocytopenia, unspecified: Secondary | ICD-10-CM | POA: Diagnosis not present

## 2022-06-12 DIAGNOSIS — Z931 Gastrostomy status: Secondary | ICD-10-CM | POA: Diagnosis not present

## 2022-06-12 DIAGNOSIS — I69359 Hemiplegia and hemiparesis following cerebral infarction affecting unspecified side: Secondary | ICD-10-CM | POA: Diagnosis not present

## 2022-06-12 DIAGNOSIS — R1312 Dysphagia, oropharyngeal phase: Secondary | ICD-10-CM | POA: Diagnosis not present

## 2022-06-12 DIAGNOSIS — E1165 Type 2 diabetes mellitus with hyperglycemia: Secondary | ICD-10-CM | POA: Diagnosis not present

## 2022-06-12 DIAGNOSIS — Z7984 Long term (current) use of oral hypoglycemic drugs: Secondary | ICD-10-CM | POA: Diagnosis not present

## 2022-06-12 DIAGNOSIS — N281 Cyst of kidney, acquired: Secondary | ICD-10-CM | POA: Diagnosis not present

## 2022-06-12 DIAGNOSIS — I480 Paroxysmal atrial fibrillation: Secondary | ICD-10-CM | POA: Diagnosis not present

## 2022-06-13 ENCOUNTER — Telehealth: Payer: Self-pay | Admitting: Family Medicine

## 2022-06-13 ENCOUNTER — Ambulatory Visit: Payer: Medicare Other | Attending: Cardiology | Admitting: Cardiology

## 2022-06-13 ENCOUNTER — Ambulatory Visit (INDEPENDENT_AMBULATORY_CARE_PROVIDER_SITE_OTHER): Payer: Medicare Other

## 2022-06-13 ENCOUNTER — Encounter: Payer: Self-pay | Admitting: Cardiology

## 2022-06-13 VITALS — BP 96/70 | HR 100

## 2022-06-13 DIAGNOSIS — R4589 Other symptoms and signs involving emotional state: Secondary | ICD-10-CM

## 2022-06-13 DIAGNOSIS — I2510 Atherosclerotic heart disease of native coronary artery without angina pectoris: Secondary | ICD-10-CM

## 2022-06-13 DIAGNOSIS — I4891 Unspecified atrial fibrillation: Secondary | ICD-10-CM

## 2022-06-13 DIAGNOSIS — Z4889 Encounter for other specified surgical aftercare: Secondary | ICD-10-CM | POA: Diagnosis not present

## 2022-06-13 DIAGNOSIS — I441 Atrioventricular block, second degree: Secondary | ICD-10-CM | POA: Diagnosis not present

## 2022-06-13 DIAGNOSIS — I255 Ischemic cardiomyopathy: Secondary | ICD-10-CM | POA: Diagnosis not present

## 2022-06-13 DIAGNOSIS — Z4800 Encounter for change or removal of nonsurgical wound dressing: Secondary | ICD-10-CM | POA: Diagnosis not present

## 2022-06-13 LAB — CUP PACEART REMOTE DEVICE CHECK
Battery Remaining Longevity: 82 mo
Battery Remaining Percentage: 68 %
Battery Voltage: 2.99 V
Brady Statistic AP VP Percent: 26 %
Brady Statistic AP VS Percent: 1 %
Brady Statistic AS VP Percent: 74 %
Brady Statistic AS VS Percent: 1 %
Brady Statistic RA Percent Paced: 25 %
Brady Statistic RV Percent Paced: 99 %
Date Time Interrogation Session: 20231020020015
Implantable Lead Implant Date: 20200630
Implantable Lead Implant Date: 20200630
Implantable Lead Location: 753859
Implantable Lead Location: 753860
Implantable Pulse Generator Implant Date: 20200630
Lead Channel Impedance Value: 530 Ohm
Lead Channel Impedance Value: 530 Ohm
Lead Channel Pacing Threshold Amplitude: 0.75 V
Lead Channel Pacing Threshold Amplitude: 0.75 V
Lead Channel Pacing Threshold Pulse Width: 0.5 ms
Lead Channel Pacing Threshold Pulse Width: 0.5 ms
Lead Channel Sensing Intrinsic Amplitude: 1.5 mV
Lead Channel Sensing Intrinsic Amplitude: 11.8 mV
Lead Channel Setting Pacing Amplitude: 1 V
Lead Channel Setting Pacing Amplitude: 2 V
Lead Channel Setting Pacing Pulse Width: 0.5 ms
Lead Channel Setting Sensing Sensitivity: 4 mV
Pulse Gen Model: 2272
Pulse Gen Serial Number: 3311958

## 2022-06-13 MED ORDER — VENLAFAXINE HCL 37.5 MG PO TABS: 37.5000 mg | ORAL_TABLET | Freq: Every day | ORAL | 0 refills | Status: DC
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
## 2022-06-13 NOTE — Telephone Encounter (Signed)
  Prescription Request  06/13/2022  Is this a "Controlled Substance" medicine? no  Have you seen your PCP in the last 2 weeks? no  If YES, route message to pool  -  If NO, patient needs to be scheduled for appointment.  What is the name of the medication or equipment? Venlafaxine 37.5 - Patient takes one pill a day and pharmacy has a half a pill. Patient is out.  Have you contacted your pharmacy to request a refill? yes   Which pharmacy would you like this sent to? [facility]    Patient notified that their request is being sent to the clinical staff for review and that they should receive a response within 2 business days.

## 2022-06-13 NOTE — Patient Instructions (Signed)
Medication Instructions:  Continue all current medications.  Labwork: none  Testing/Procedures: none  Follow-Up: 4 months   Any Other Special Instructions Will Be Listed Below (If Applicable).  If you need a refill on your cardiac medications before your next appointment, please call your pharmacy.\ 

## 2022-06-13 NOTE — Telephone Encounter (Signed)
LMOVM refill sent to pharmacy 

## 2022-06-14 ENCOUNTER — Other Ambulatory Visit: Payer: Self-pay | Admitting: Family Medicine

## 2022-06-16 ENCOUNTER — Ambulatory Visit: Payer: Self-pay | Admitting: *Deleted

## 2022-06-16 DIAGNOSIS — Z7984 Long term (current) use of oral hypoglycemic drugs: Secondary | ICD-10-CM | POA: Diagnosis not present

## 2022-06-16 DIAGNOSIS — Z7901 Long term (current) use of anticoagulants: Secondary | ICD-10-CM | POA: Diagnosis not present

## 2022-06-16 DIAGNOSIS — N281 Cyst of kidney, acquired: Secondary | ICD-10-CM | POA: Diagnosis not present

## 2022-06-16 DIAGNOSIS — J690 Pneumonitis due to inhalation of food and vomit: Secondary | ICD-10-CM | POA: Diagnosis not present

## 2022-06-16 DIAGNOSIS — D696 Thrombocytopenia, unspecified: Secondary | ICD-10-CM | POA: Diagnosis not present

## 2022-06-16 DIAGNOSIS — Z931 Gastrostomy status: Secondary | ICD-10-CM | POA: Diagnosis not present

## 2022-06-16 DIAGNOSIS — Z87440 Personal history of urinary (tract) infections: Secondary | ICD-10-CM | POA: Diagnosis not present

## 2022-06-16 DIAGNOSIS — E1165 Type 2 diabetes mellitus with hyperglycemia: Secondary | ICD-10-CM | POA: Diagnosis not present

## 2022-06-16 DIAGNOSIS — R1312 Dysphagia, oropharyngeal phase: Secondary | ICD-10-CM | POA: Diagnosis not present

## 2022-06-16 DIAGNOSIS — I480 Paroxysmal atrial fibrillation: Secondary | ICD-10-CM | POA: Diagnosis not present

## 2022-06-16 NOTE — Patient Outreach (Signed)
  Care Coordination   06/16/2022 Name: [name] MRN: [id number] DOB: 1933-04-14   Care Coordination Outreach Attempts:  An unsuccessful telephone outreach was attempted today to offer the patient information about available care coordination services as a benefit of their health plan.   Male family member, one of the son in laws. Shares that [name] is sleeping at the time of this outreach and [name] the daughter is not present today The son in law reports [name] continues not to progress well with his home therapies: not able to stand, continues with neck pain, poor strength   Follow Up Plan:  Additional outreach attempts will be made to offer the patient care coordination information and services.   Encounter Outcome:  No Answer  Care Coordination Interventions Activated:  No   Care Coordination Interventions:  No, not indicated    Anisten Tomassi L. Lavina Hamman, RN, BSN, [place] Coordinator Office number [phone]

## 2022-06-18 ENCOUNTER — Telehealth: Payer: Self-pay | Admitting: *Deleted

## 2022-06-18 ENCOUNTER — Other Ambulatory Visit: Payer: Self-pay | Admitting: Family Medicine

## 2022-06-18 DIAGNOSIS — F418 Other specified anxiety disorders: Secondary | ICD-10-CM

## 2022-06-18 DIAGNOSIS — R630 Anorexia: Secondary | ICD-10-CM

## 2022-06-18 DIAGNOSIS — Z9189 Other specified personal risk factors, not elsewhere classified: Secondary | ICD-10-CM

## 2022-06-18 NOTE — Progress Notes (Signed)
Appt made

## 2022-06-18 NOTE — Progress Notes (Signed)
Patient should have a virtual visit for the Mirtazapine.  He is on Effexor already.  Do NOT recommend that these be used together.  We can wean the effexor.

## 2022-06-18 NOTE — Patient Outreach (Signed)
  Care Coordination   Collaboration with primary MD/[name]  Visit Note   06/19/2022 Name: [name] MRN: [id number] DOB: 06-15-1933  [name] is a 88 y.o. year old male who sees Calvin Norlander, DO for primary care. I  received a message  from patient's [name] and returned a call  What matters to the patients health and wellness today?  Assisted to get medication refill for phosphorus and new order for mirtazapine [name] believes he is dehydrated and want assist to offer hydration Suggest popcicles, humidifier. Patient reported not to like popcicles His appetite remains poor and [name] requests a prescription for mirtazapine Labs are good  wound is healing    Goals Addressed               This Visit's Progress     Patient Stated     manage potassium (THN) (pt-stated)   Not on track     Care Coordination Interventions: Spoke with  Discussed with pt daughters that RN CM spoke with 2 [facility] home health staff members and then [name] at [facility] [phone] to leave a message for [name]- for dietitian assistance to modify PEG tube feeding to manage prevention of abnormal potassium values as discussed with pcp. Discussed with the daughters that [name] does not have a dietitian that could offer assist to provide some extra potassium into his feeds that may allow discontinuation of the supplement as discussed by pcp.  Discussed the continuation of the supplement.This still remains pending as [facility] does not have a dietitian. Updated pcp via in basket message      pain management (THN) (pt-stated)   Not on track     Care Coordination Interventions: Assessed the effectiveness of the prescribed Lidocaine patches and Voltaren gel for [name] neck pain. Confirmed with [name], [name] that these interventions plus others continue not to provide relief  Confirmed with [name] that an appointment has been scheduled with [name] Active listening  Outreach to  [name] about phosphorus and mirtazapine prescriptions          SDOH assessments and interventions completed:  No     Care Coordination Interventions Activated:  Yes  Care Coordination Interventions:  Yes, provided   Follow up plan: Follow up call scheduled for 06/30/22    Encounter Outcome:  Pt. Visit Completed   [name] L. Lavina Hamman, RN, BSN, [place] Coordinator Office number [phone]

## 2022-06-19 DIAGNOSIS — Z7984 Long term (current) use of oral hypoglycemic drugs: Secondary | ICD-10-CM | POA: Diagnosis not present

## 2022-06-19 DIAGNOSIS — R1312 Dysphagia, oropharyngeal phase: Secondary | ICD-10-CM | POA: Diagnosis not present

## 2022-06-19 DIAGNOSIS — I480 Paroxysmal atrial fibrillation: Secondary | ICD-10-CM | POA: Diagnosis not present

## 2022-06-19 DIAGNOSIS — D696 Thrombocytopenia, unspecified: Secondary | ICD-10-CM | POA: Diagnosis not present

## 2022-06-19 DIAGNOSIS — R1310 Dysphagia, unspecified: Secondary | ICD-10-CM | POA: Diagnosis not present

## 2022-06-19 DIAGNOSIS — E119 Type 2 diabetes mellitus without complications: Secondary | ICD-10-CM | POA: Diagnosis not present

## 2022-06-19 DIAGNOSIS — J690 Pneumonitis due to inhalation of food and vomit: Secondary | ICD-10-CM | POA: Diagnosis not present

## 2022-06-19 DIAGNOSIS — Z87440 Personal history of urinary (tract) infections: Secondary | ICD-10-CM | POA: Diagnosis not present

## 2022-06-19 DIAGNOSIS — E1165 Type 2 diabetes mellitus with hyperglycemia: Secondary | ICD-10-CM | POA: Diagnosis not present

## 2022-06-19 DIAGNOSIS — G9341 Metabolic encephalopathy: Secondary | ICD-10-CM | POA: Diagnosis not present

## 2022-06-19 DIAGNOSIS — Z7901 Long term (current) use of anticoagulants: Secondary | ICD-10-CM | POA: Diagnosis not present

## 2022-06-19 DIAGNOSIS — Z931 Gastrostomy status: Secondary | ICD-10-CM | POA: Diagnosis not present

## 2022-06-19 DIAGNOSIS — E43 Unspecified severe protein-calorie malnutrition: Secondary | ICD-10-CM | POA: Diagnosis not present

## 2022-06-19 DIAGNOSIS — N281 Cyst of kidney, acquired: Secondary | ICD-10-CM | POA: Diagnosis not present

## 2022-06-19 NOTE — Patient Instructions (Addendum)
Visit Information  Thank you for taking time to visit with me today. Please don't hesitate to contact me if I can be of assistance to you.   Following are the goals we discussed today:   Goals Addressed               This Visit's Progress     Patient Stated     manage potassium (THN) (pt-stated)   Not on track     Care Coordination Interventions: Spoke with  Discussed with pt daughters that RN CM spoke with 2 [facility] home health staff members and then Maudie Mercury at [facility] [phone] to leave a message for Carolynn Sayers- for dietitian assistance to modify PEG tube feeding to manage prevention of abnormal potassium values as discussed with pcp. Discussed with the daughters that Latricia Heft does not have a dietitian that could offer assist to provide some extra potassium into his feeds that may allow discontinuation of the supplement as discussed by pcp.  Discussed the continuation of the supplement.This still remains pending as [facility] does not have a dietitian. Updated pcp via in basket message      pain management (THN) (pt-stated)   Not on track     Care Coordination Interventions: Assessed the effectiveness of the prescribed Lidocaine patches and Voltaren gel for Mr Escoto neck pain. Confirmed with [name], daughter that these interventions plus others continue not to provide relief  Confirmed with [name] that an appointment has been scheduled with Dr Nelva Bush Active listening  Outreach to Dr Lajuana Ripple about phosphorus and mirtazapine prescriptions          Our next appointment is by telephone on 06/30/22 at 1130  Please call the care guide team at [phone] if you need to cancel or reschedule your appointment.   If you are experiencing a Mental Health or [place] or need someone to talk to, please call the Suicide and Crisis Lifeline: 988 call the [place] National Suicide Prevention Lifeline: 254-459-0433 or TTY: 267-311-6095 TTY (737)547-0063) to talk to a  trained counselor call 1-800-273-TALK (toll free, 24 hour hotline) call the [facility]: 4180428167 call 911   Patient verbalizes understanding of instructions and care plan provided today and agrees to view in [place]. Active MyChart status and patient understanding of how to access instructions and care plan via MyChart confirmed with patient.     The patient has been provided with contact information for the care management team and has been advised to call with any health related questions or concerns.    Annebelle Bostic L. Lavina Hamman, RN, BSN, [place] Coordinator Office number [phone]

## 2022-06-19 NOTE — Progress Notes (Signed)
Remote pacemaker transmission.   

## 2022-06-20 DIAGNOSIS — E1165 Type 2 diabetes mellitus with hyperglycemia: Secondary | ICD-10-CM | POA: Diagnosis not present

## 2022-06-20 DIAGNOSIS — Z931 Gastrostomy status: Secondary | ICD-10-CM | POA: Diagnosis not present

## 2022-06-20 DIAGNOSIS — N281 Cyst of kidney, acquired: Secondary | ICD-10-CM | POA: Diagnosis not present

## 2022-06-20 DIAGNOSIS — R1312 Dysphagia, oropharyngeal phase: Secondary | ICD-10-CM | POA: Diagnosis not present

## 2022-06-20 DIAGNOSIS — D696 Thrombocytopenia, unspecified: Secondary | ICD-10-CM | POA: Diagnosis not present

## 2022-06-20 DIAGNOSIS — Z7984 Long term (current) use of oral hypoglycemic drugs: Secondary | ICD-10-CM | POA: Diagnosis not present

## 2022-06-20 DIAGNOSIS — Z87440 Personal history of urinary (tract) infections: Secondary | ICD-10-CM | POA: Diagnosis not present

## 2022-06-20 DIAGNOSIS — J690 Pneumonitis due to inhalation of food and vomit: Secondary | ICD-10-CM | POA: Diagnosis not present

## 2022-06-20 DIAGNOSIS — Z7901 Long term (current) use of anticoagulants: Secondary | ICD-10-CM | POA: Diagnosis not present

## 2022-06-20 DIAGNOSIS — I480 Paroxysmal atrial fibrillation: Secondary | ICD-10-CM | POA: Diagnosis not present

## 2022-06-24 DIAGNOSIS — E1165 Type 2 diabetes mellitus with hyperglycemia: Secondary | ICD-10-CM | POA: Diagnosis not present

## 2022-06-24 DIAGNOSIS — I480 Paroxysmal atrial fibrillation: Secondary | ICD-10-CM | POA: Diagnosis not present

## 2022-06-24 DIAGNOSIS — Z87440 Personal history of urinary (tract) infections: Secondary | ICD-10-CM | POA: Diagnosis not present

## 2022-06-24 DIAGNOSIS — N281 Cyst of kidney, acquired: Secondary | ICD-10-CM | POA: Diagnosis not present

## 2022-06-24 DIAGNOSIS — Z7951 Long term (current) use of inhaled steroids: Secondary | ICD-10-CM | POA: Diagnosis not present

## 2022-06-24 DIAGNOSIS — Z7984 Long term (current) use of oral hypoglycemic drugs: Secondary | ICD-10-CM | POA: Diagnosis not present

## 2022-06-24 DIAGNOSIS — D696 Thrombocytopenia, unspecified: Secondary | ICD-10-CM | POA: Diagnosis not present

## 2022-06-24 DIAGNOSIS — Z431 Encounter for attention to gastrostomy: Secondary | ICD-10-CM | POA: Diagnosis not present

## 2022-06-24 DIAGNOSIS — R1312 Dysphagia, oropharyngeal phase: Secondary | ICD-10-CM | POA: Diagnosis not present

## 2022-06-25 ENCOUNTER — Encounter: Payer: Self-pay | Admitting: Family Medicine

## 2022-06-25 ENCOUNTER — Ambulatory Visit (INDEPENDENT_AMBULATORY_CARE_PROVIDER_SITE_OTHER): Payer: Medicare Other | Admitting: Family Medicine

## 2022-06-25 DIAGNOSIS — Z931 Gastrostomy status: Secondary | ICD-10-CM | POA: Diagnosis not present

## 2022-06-25 DIAGNOSIS — R630 Anorexia: Secondary | ICD-10-CM | POA: Diagnosis not present

## 2022-06-25 DIAGNOSIS — I480 Paroxysmal atrial fibrillation: Secondary | ICD-10-CM

## 2022-06-25 MED ORDER — MIRTAZAPINE 7.5 MG PO TABS: 7.5000 mg | ORAL_TABLET | Freq: Every day | ORAL | 3 refills | Status: DC
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
## 2022-06-26 DIAGNOSIS — E1165 Type 2 diabetes mellitus with hyperglycemia: Secondary | ICD-10-CM | POA: Diagnosis not present

## 2022-06-26 DIAGNOSIS — N281 Cyst of kidney, acquired: Secondary | ICD-10-CM | POA: Diagnosis not present

## 2022-06-26 DIAGNOSIS — Z9189 Other specified personal risk factors, not elsewhere classified: Secondary | ICD-10-CM | POA: Diagnosis not present

## 2022-06-26 DIAGNOSIS — Z931 Gastrostomy status: Secondary | ICD-10-CM | POA: Diagnosis not present

## 2022-06-26 DIAGNOSIS — E119 Type 2 diabetes mellitus without complications: Secondary | ICD-10-CM | POA: Diagnosis not present

## 2022-06-26 DIAGNOSIS — D696 Thrombocytopenia, unspecified: Secondary | ICD-10-CM | POA: Diagnosis not present

## 2022-06-26 DIAGNOSIS — R1310 Dysphagia, unspecified: Secondary | ICD-10-CM | POA: Diagnosis not present

## 2022-06-26 DIAGNOSIS — Z7984 Long term (current) use of oral hypoglycemic drugs: Secondary | ICD-10-CM | POA: Diagnosis not present

## 2022-06-26 DIAGNOSIS — G9341 Metabolic encephalopathy: Secondary | ICD-10-CM | POA: Diagnosis not present

## 2022-06-26 DIAGNOSIS — Z431 Encounter for attention to gastrostomy: Secondary | ICD-10-CM | POA: Diagnosis not present

## 2022-06-26 DIAGNOSIS — R1312 Dysphagia, oropharyngeal phase: Secondary | ICD-10-CM | POA: Diagnosis not present

## 2022-06-26 DIAGNOSIS — Z87440 Personal history of urinary (tract) infections: Secondary | ICD-10-CM | POA: Diagnosis not present

## 2022-06-26 DIAGNOSIS — Z7951 Long term (current) use of inhaled steroids: Secondary | ICD-10-CM | POA: Diagnosis not present

## 2022-06-26 DIAGNOSIS — I480 Paroxysmal atrial fibrillation: Secondary | ICD-10-CM | POA: Diagnosis not present

## 2022-06-27 ENCOUNTER — Encounter: Payer: Medicare Other | Admitting: Internal Medicine

## 2022-06-30 ENCOUNTER — Ambulatory Visit: Payer: Self-pay | Admitting: *Deleted

## 2022-06-30 ENCOUNTER — Other Ambulatory Visit: Payer: Self-pay | Admitting: Family Medicine

## 2022-06-30 DIAGNOSIS — D696 Thrombocytopenia, unspecified: Secondary | ICD-10-CM | POA: Diagnosis not present

## 2022-06-30 DIAGNOSIS — Z87440 Personal history of urinary (tract) infections: Secondary | ICD-10-CM | POA: Diagnosis not present

## 2022-06-30 DIAGNOSIS — Z7984 Long term (current) use of oral hypoglycemic drugs: Secondary | ICD-10-CM | POA: Diagnosis not present

## 2022-06-30 DIAGNOSIS — Z7951 Long term (current) use of inhaled steroids: Secondary | ICD-10-CM | POA: Diagnosis not present

## 2022-06-30 DIAGNOSIS — I480 Paroxysmal atrial fibrillation: Secondary | ICD-10-CM | POA: Diagnosis not present

## 2022-06-30 DIAGNOSIS — R1312 Dysphagia, oropharyngeal phase: Secondary | ICD-10-CM | POA: Diagnosis not present

## 2022-06-30 DIAGNOSIS — N281 Cyst of kidney, acquired: Secondary | ICD-10-CM | POA: Diagnosis not present

## 2022-06-30 DIAGNOSIS — E1165 Type 2 diabetes mellitus with hyperglycemia: Secondary | ICD-10-CM | POA: Diagnosis not present

## 2022-06-30 DIAGNOSIS — Z431 Encounter for attention to gastrostomy: Secondary | ICD-10-CM | POA: Diagnosis not present

## 2022-06-30 NOTE — Patient Outreach (Signed)
  Care Coordination   Follow Up Visit Note   06/30/2022 Name: ADRIN JULIAN MRN: [id number] DOB: 03/04/1933  GERRY BLANCHFIELD is a 88 y.o. year old male who sees [name], DO for primary care. I spoke with Mikki Santee, son in law (Teresa's husband) for [place] T[place] by phone today.  What matters to the patients health and wellness today?  Improvements are reported in his physical therapy today ("best he has ever done") plus with his fluid intake. Denies swelling, dry skin, chapped lips. Appetite about the same. Recent discontinued Effexor with mirtazapine started  He has an appointment with Dr Nelva Bush on 07/01/22   Goals Addressed               This Visit's Progress     Patient Stated     manage potassium (THN) (pt-stated)   On track     Care Coordination Interventions: Spoke with Mikki Santee son in law No concerns with potassium reported      pain management [facility]) (pt-stated)   On track     Care Coordination Interventions: Discussed importance of adherence to all scheduled medical appointments Confirmed with Teresa;s husband Mikki Santee that an appointment has been scheduled with Dr Nelva Bush for 06/30/22 Confirmed improvement with home therapy today.         wound dressing supplies (THN) (pt-stated)   On track     Care Coordination Interventions: Dressing for wound received. Site healing          SDOH assessments and interventions completed:  No     Care Coordination Interventions Activated:  Yes  Care Coordination Interventions:  Yes, provided   Follow up plan: Follow up call scheduled for 08/12/22    Encounter Outcome:  Pt. Visit Completed   Beecher Furio L. Lavina Hamman, RN, BSN, [place] Coordinator Office number [phone]

## 2022-06-30 NOTE — Telephone Encounter (Signed)
  Prescription Request  06/30/2022  Is this a "Controlled Substance" medicine? no  Have you seen your PCP in the last 2 weeks? no  If YES, route message to pool  -  If NO, patient needs to be scheduled for appointment.  What is the name of the medication or equipment? Nitrofurantoin 50 mg  Have you contacted your pharmacy to request a refill? no   Which pharmacy would you like this sent to? [facility]   Patient notified that their request is being sent to the clinical staff for review and that they should receive a response within 2 business days.

## 2022-06-30 NOTE — Telephone Encounter (Signed)
TC back to [name] said after he had the cystoscope was done in August that this was going to be a maintenance med for UTI's Please advise on RF

## 2022-06-30 NOTE — Telephone Encounter (Signed)
Will message Dr Jeffie Pollock to get this renewed for patient if his plan is to use this for prevention

## 2022-06-30 NOTE — Patient Instructions (Signed)
Visit Information  Thank you for taking time to visit with me today. Please don't hesitate to contact me if I can be of assistance to you.   Following are the goals we discussed today:   Goals Addressed               This Visit's Progress     Patient Stated     manage potassium (THN) (pt-stated)   On track     Care Coordination Interventions: Spoke with Mikki Santee son in law No concerns with potassium reported      pain management (THN) (pt-stated)   On track     Care Coordination Interventions: Discussed importance of adherence to all scheduled medical appointments Confirmed with Teresa;s husband Mikki Santee that an appointment has been scheduled with Dr Nelva Bush for 06/30/22 Confirmed improvement with home therapy today.         wound dressing supplies (THN) (pt-stated)   On track     Care Coordination Interventions: Dressing for wound received. Site healing          Our next appointment is by telephone on 07/28/22 at 1130  Please call the care guide team at [phone] if you need to cancel or reschedule your appointment.   If you are experiencing a Mental Health or [place] or need someone to talk to, please call the Suicide and Crisis Lifeline: 988 call the [place] National Suicide Prevention Lifeline: 587-614-2360 or TTY: (343)649-0725 TTY 860-098-8051) to talk to a trained counselor call 1-800-273-TALK (toll free, 24 hour hotline) call the [facility]: ([phone] call 911   Patient verbalizes understanding of instructions and care plan provided today and agrees to view in [place]. Active MyChart status and patient understanding of how to access instructions and care plan via MyChart confirmed with patient.     The patient has been provided with contact information for the care management team and has been advised to call with any health related questions or concerns.   Lulla Linville L. Lavina Hamman, RN, BSN, [place] Coordinator Office number [phone]

## 2022-07-01 ENCOUNTER — Ambulatory Visit: Payer: Medicare Other | Admitting: Family Medicine

## 2022-07-01 DIAGNOSIS — M25512 Pain in left shoulder: Secondary | ICD-10-CM | POA: Insufficient documentation

## 2022-07-01 MED ORDER — NITROFURANTOIN MACROCRYSTAL 50 MG PO CAPS: 50.0000 mg | ORAL_CAPSULE | Freq: Every day | ORAL | 0 refills | Status: DC

## 2022-07-02 DIAGNOSIS — I480 Paroxysmal atrial fibrillation: Secondary | ICD-10-CM | POA: Diagnosis not present

## 2022-07-02 DIAGNOSIS — N281 Cyst of kidney, acquired: Secondary | ICD-10-CM | POA: Diagnosis not present

## 2022-07-02 DIAGNOSIS — R1312 Dysphagia, oropharyngeal phase: Secondary | ICD-10-CM | POA: Diagnosis not present

## 2022-07-02 DIAGNOSIS — Z7951 Long term (current) use of inhaled steroids: Secondary | ICD-10-CM | POA: Diagnosis not present

## 2022-07-02 DIAGNOSIS — Z7984 Long term (current) use of oral hypoglycemic drugs: Secondary | ICD-10-CM | POA: Diagnosis not present

## 2022-07-02 DIAGNOSIS — Z431 Encounter for attention to gastrostomy: Secondary | ICD-10-CM | POA: Diagnosis not present

## 2022-07-02 DIAGNOSIS — D696 Thrombocytopenia, unspecified: Secondary | ICD-10-CM | POA: Diagnosis not present

## 2022-07-02 DIAGNOSIS — Z87440 Personal history of urinary (tract) infections: Secondary | ICD-10-CM | POA: Diagnosis not present

## 2022-07-02 DIAGNOSIS — E1165 Type 2 diabetes mellitus with hyperglycemia: Secondary | ICD-10-CM | POA: Diagnosis not present

## 2022-07-04 ENCOUNTER — Telehealth: Payer: Self-pay | Admitting: Family Medicine

## 2022-07-07 DIAGNOSIS — E1165 Type 2 diabetes mellitus with hyperglycemia: Secondary | ICD-10-CM | POA: Diagnosis not present

## 2022-07-07 DIAGNOSIS — E43 Unspecified severe protein-calorie malnutrition: Secondary | ICD-10-CM | POA: Diagnosis not present

## 2022-07-07 DIAGNOSIS — E119 Type 2 diabetes mellitus without complications: Secondary | ICD-10-CM | POA: Diagnosis not present

## 2022-07-07 DIAGNOSIS — D696 Thrombocytopenia, unspecified: Secondary | ICD-10-CM | POA: Diagnosis not present

## 2022-07-07 DIAGNOSIS — Z7984 Long term (current) use of oral hypoglycemic drugs: Secondary | ICD-10-CM | POA: Diagnosis not present

## 2022-07-07 DIAGNOSIS — Z7951 Long term (current) use of inhaled steroids: Secondary | ICD-10-CM | POA: Diagnosis not present

## 2022-07-07 DIAGNOSIS — G9341 Metabolic encephalopathy: Secondary | ICD-10-CM | POA: Diagnosis not present

## 2022-07-07 DIAGNOSIS — R1312 Dysphagia, oropharyngeal phase: Secondary | ICD-10-CM | POA: Diagnosis not present

## 2022-07-07 DIAGNOSIS — Z931 Gastrostomy status: Secondary | ICD-10-CM | POA: Diagnosis not present

## 2022-07-07 DIAGNOSIS — I480 Paroxysmal atrial fibrillation: Secondary | ICD-10-CM | POA: Diagnosis not present

## 2022-07-07 DIAGNOSIS — Z431 Encounter for attention to gastrostomy: Secondary | ICD-10-CM | POA: Diagnosis not present

## 2022-07-07 DIAGNOSIS — Z87440 Personal history of urinary (tract) infections: Secondary | ICD-10-CM | POA: Diagnosis not present

## 2022-07-07 DIAGNOSIS — R1310 Dysphagia, unspecified: Secondary | ICD-10-CM | POA: Diagnosis not present

## 2022-07-07 DIAGNOSIS — N281 Cyst of kidney, acquired: Secondary | ICD-10-CM | POA: Diagnosis not present

## 2022-07-07 MED ORDER — MIRTAZAPINE 15 MG PO TABS: 15.0000 mg | ORAL_TABLET | Freq: Every day | ORAL | 0 refills | Status: DC

## 2022-07-07 NOTE — Telephone Encounter (Signed)
Pt aware.

## 2022-07-07 NOTE — Telephone Encounter (Signed)
Ok to increase to '15mg'$ .  I will send in new rx.

## 2022-07-10 ENCOUNTER — Other Ambulatory Visit: Payer: Self-pay | Admitting: Family Medicine

## 2022-07-10 DIAGNOSIS — R4589 Other symptoms and signs involving emotional state: Secondary | ICD-10-CM

## 2022-07-11 ENCOUNTER — Telehealth: Payer: Self-pay | Admitting: Family Medicine

## 2022-07-11 DIAGNOSIS — N281 Cyst of kidney, acquired: Secondary | ICD-10-CM | POA: Diagnosis not present

## 2022-07-11 DIAGNOSIS — D696 Thrombocytopenia, unspecified: Secondary | ICD-10-CM | POA: Diagnosis not present

## 2022-07-11 DIAGNOSIS — E1165 Type 2 diabetes mellitus with hyperglycemia: Secondary | ICD-10-CM | POA: Diagnosis not present

## 2022-07-11 DIAGNOSIS — Z87440 Personal history of urinary (tract) infections: Secondary | ICD-10-CM | POA: Diagnosis not present

## 2022-07-11 DIAGNOSIS — I480 Paroxysmal atrial fibrillation: Secondary | ICD-10-CM | POA: Diagnosis not present

## 2022-07-11 DIAGNOSIS — R1312 Dysphagia, oropharyngeal phase: Secondary | ICD-10-CM | POA: Diagnosis not present

## 2022-07-11 DIAGNOSIS — Z431 Encounter for attention to gastrostomy: Secondary | ICD-10-CM | POA: Diagnosis not present

## 2022-07-11 DIAGNOSIS — Z7984 Long term (current) use of oral hypoglycemic drugs: Secondary | ICD-10-CM | POA: Diagnosis not present

## 2022-07-11 DIAGNOSIS — Z7951 Long term (current) use of inhaled steroids: Secondary | ICD-10-CM | POA: Diagnosis not present

## 2022-07-11 NOTE — Telephone Encounter (Signed)
BMP was preordered at last visit.

## 2022-07-11 NOTE — Telephone Encounter (Signed)
Lmtcb.

## 2022-07-11 NOTE — Telephone Encounter (Signed)
Patient's daughter would like to know if patient should come in to get labs before his appointment on 1/3, if so can they be ordered through [employer] so that the home health nurse can come out to draw? Daughter would also like to discuss a medication with a nurse. Please call back and advise.

## 2022-07-13 DIAGNOSIS — I69359 Hemiplegia and hemiparesis following cerebral infarction affecting unspecified side: Secondary | ICD-10-CM | POA: Diagnosis not present

## 2022-07-13 DIAGNOSIS — L89153 Pressure ulcer of sacral region, stage 3: Secondary | ICD-10-CM | POA: Diagnosis not present

## 2022-07-15 DIAGNOSIS — D696 Thrombocytopenia, unspecified: Secondary | ICD-10-CM | POA: Diagnosis not present

## 2022-07-15 DIAGNOSIS — Z7984 Long term (current) use of oral hypoglycemic drugs: Secondary | ICD-10-CM | POA: Diagnosis not present

## 2022-07-15 DIAGNOSIS — Z87440 Personal history of urinary (tract) infections: Secondary | ICD-10-CM | POA: Diagnosis not present

## 2022-07-15 DIAGNOSIS — Z7951 Long term (current) use of inhaled steroids: Secondary | ICD-10-CM | POA: Diagnosis not present

## 2022-07-15 DIAGNOSIS — R1312 Dysphagia, oropharyngeal phase: Secondary | ICD-10-CM | POA: Diagnosis not present

## 2022-07-15 DIAGNOSIS — E1165 Type 2 diabetes mellitus with hyperglycemia: Secondary | ICD-10-CM | POA: Diagnosis not present

## 2022-07-15 DIAGNOSIS — I480 Paroxysmal atrial fibrillation: Secondary | ICD-10-CM | POA: Diagnosis not present

## 2022-07-15 DIAGNOSIS — N281 Cyst of kidney, acquired: Secondary | ICD-10-CM | POA: Diagnosis not present

## 2022-07-15 DIAGNOSIS — Z431 Encounter for attention to gastrostomy: Secondary | ICD-10-CM | POA: Diagnosis not present

## 2022-07-18 ENCOUNTER — Other Ambulatory Visit: Payer: Self-pay | Admitting: Cardiology

## 2022-07-20 DIAGNOSIS — E119 Type 2 diabetes mellitus without complications: Secondary | ICD-10-CM | POA: Diagnosis not present

## 2022-07-20 DIAGNOSIS — R1310 Dysphagia, unspecified: Secondary | ICD-10-CM | POA: Diagnosis not present

## 2022-07-20 DIAGNOSIS — G9341 Metabolic encephalopathy: Secondary | ICD-10-CM | POA: Diagnosis not present

## 2022-07-20 DIAGNOSIS — E43 Unspecified severe protein-calorie malnutrition: Secondary | ICD-10-CM | POA: Diagnosis not present

## 2022-07-20 DIAGNOSIS — Z931 Gastrostomy status: Secondary | ICD-10-CM | POA: Diagnosis not present

## 2022-07-21 DIAGNOSIS — R1310 Dysphagia, unspecified: Secondary | ICD-10-CM | POA: Diagnosis not present

## 2022-07-26 DIAGNOSIS — Z9189 Other specified personal risk factors, not elsewhere classified: Secondary | ICD-10-CM | POA: Diagnosis not present

## 2022-07-26 DIAGNOSIS — Z931 Gastrostomy status: Secondary | ICD-10-CM | POA: Diagnosis not present

## 2022-07-26 DIAGNOSIS — E119 Type 2 diabetes mellitus without complications: Secondary | ICD-10-CM | POA: Diagnosis not present

## 2022-07-26 DIAGNOSIS — G9341 Metabolic encephalopathy: Secondary | ICD-10-CM | POA: Diagnosis not present

## 2022-07-26 DIAGNOSIS — R1310 Dysphagia, unspecified: Secondary | ICD-10-CM | POA: Diagnosis not present

## 2022-07-27 ENCOUNTER — Other Ambulatory Visit: Payer: Self-pay | Admitting: Urology

## 2022-07-28 ENCOUNTER — Encounter: Payer: Self-pay | Admitting: *Deleted

## 2022-07-28 ENCOUNTER — Telehealth: Payer: Self-pay

## 2022-07-28 ENCOUNTER — Ambulatory Visit: Payer: Self-pay | Admitting: *Deleted

## 2022-07-28 DIAGNOSIS — Z87440 Personal history of urinary (tract) infections: Secondary | ICD-10-CM | POA: Diagnosis not present

## 2022-07-28 DIAGNOSIS — Z7984 Long term (current) use of oral hypoglycemic drugs: Secondary | ICD-10-CM | POA: Diagnosis not present

## 2022-07-28 DIAGNOSIS — N281 Cyst of kidney, acquired: Secondary | ICD-10-CM | POA: Diagnosis not present

## 2022-07-28 DIAGNOSIS — D696 Thrombocytopenia, unspecified: Secondary | ICD-10-CM | POA: Diagnosis not present

## 2022-07-28 DIAGNOSIS — Z431 Encounter for attention to gastrostomy: Secondary | ICD-10-CM | POA: Diagnosis not present

## 2022-07-28 DIAGNOSIS — R1312 Dysphagia, oropharyngeal phase: Secondary | ICD-10-CM | POA: Diagnosis not present

## 2022-07-28 DIAGNOSIS — E1165 Type 2 diabetes mellitus with hyperglycemia: Secondary | ICD-10-CM | POA: Diagnosis not present

## 2022-07-28 DIAGNOSIS — Z931 Gastrostomy status: Secondary | ICD-10-CM

## 2022-07-28 DIAGNOSIS — I480 Paroxysmal atrial fibrillation: Secondary | ICD-10-CM | POA: Diagnosis not present

## 2022-07-28 DIAGNOSIS — Z7951 Long term (current) use of inhaled steroids: Secondary | ICD-10-CM | POA: Diagnosis not present

## 2022-07-28 MED ORDER — ZINC OXIDE 12.8 % EX OINT: TOPICAL_OINTMENT | Freq: Two times a day (BID) | CUTANEOUS | 0 refills | Status: DC

## 2022-07-28 NOTE — Telephone Encounter (Signed)
-----   Message from [name], DO sent at 07/28/2022  1:25 PM EST ----- Regarding: FW: recent cough, Rx for triple paste [name], ok to have him come in sooner than 08/2022 visit if they have acute concerns.  Ok to give verbal for triple paste as well   ----- Message ----- From: Barbaraann Faster, RN Sent: 07/28/2022   1:07 PM EST To: [name], DO; Gabryella Murfin C Kyllie Pettijohn, CMA Subject: recent cough, Rx for triple paste              Assist needed to decrease continuous coughing episodes & renew of Triple paste Learta Codding RN, CCM, [facility]

## 2022-07-28 NOTE — Telephone Encounter (Signed)
-----   Message from [name], DO sent at 07/28/2022  1:25 PM EST ----- Regarding: FW: recent cough, Rx for triple paste Kweku Stankey, ok to have him come in sooner than 08/2022 visit if they have acute concerns.  Ok to give verbal for triple paste as well   ----- Message ----- From: Barbaraann Faster, RN Sent: 07/28/2022   1:07 PM EST To: [name], DO; Deana Krock C Fadia Marlar, CMA Subject: recent cough, Rx for triple paste              Assist needed to decrease continuous coughing episodes & renew of Triple paste Learta Codding RN, CCM, [facility]

## 2022-07-28 NOTE — Telephone Encounter (Signed)
I HAVE SENT IN REFILLS FOR TRIPLE PASTE

## 2022-07-28 NOTE — Telephone Encounter (Signed)
I HAVE REACHED OUT REGARDING MR [place] DAUGHTER NEEDING PATIENT TO BE SEEN SOONER. [place] PHONE WITH NO ANSWER

## 2022-07-28 NOTE — Telephone Encounter (Signed)
-----   Message from Barbaraann Faster, RN sent at 07/28/2022 12:56 PM EST ----- Regarding: recent cough, Rx for triple paste Assist needed to decrease continuous coughing episodes & renew of Triple paste Learta Codding RN, CCM, [facility]

## 2022-07-28 NOTE — Telephone Encounter (Signed)
Waiting return call from daughter

## 2022-07-28 NOTE — Patient Outreach (Signed)
  Care Coordination   Follow Up Visit Note   07/29/2022 Name: JUSTINO BOZE MRN: [id number] DOB: 1932/11/27  DARDEN FLEMISTER is a 88 y.o. year old male who sees [name], DO for primary care. I spoke with Rhetta Mura by phone today.  What matters to the patients health and wellness today?  Recent continuous cough - Family provides only tylenol which relaxes him  Oxycodone has also been given for whole body pain but only when needed  Family reports patient has complained of his muscles hurting and feeling tight after sessions of physical therapy    With hard coughing it is noted the he wound has more secretions on the dressings The secretions are described as the color of his tube feeding or medicine.    An inquiry as made about the prescription need or purchase of Triple paste  Triple paste is over the counter  Reviewed stores Triple paste can be purchased and encouraged to check to see if it is available on the [place] healthcare over the counter benefit or with the U card    Goals Addressed               This Visit's Progress     Patient Stated     COMPLETED: manage potassium (THN) (pt-stated)   On track     Care Coordination Interventions: Spoke with Mikki Santee son in law No concerns with potassium reported      pain management (THN) (pt-stated)   On track     Care Coordination Interventions: Discussed importance of adherence to all scheduled medical appointments Confirmed  Mikki Santee that patient attended appointment with Dr Nelva Bush on 07/01/22 Confirmed improvement with home therapy today but some pain of muscles after therapy sessions.   Confirmed excess coughing episodes         wound dressing supplies (THN) (pt-stated)   On track     Care Coordination Interventions: Confirmed Feeding tube site has some drainage with coughing episodes.  Discussed that Triple paste does not need a prescription and is an over the counter items to be purchased at local stores or using  insurance over the counter benefit (catalog, U card, calling customer service)  After concluding call with Mikki Santee Noted Triple Paste was previous prescribed by MD Outreached to pcp about this prescription, verbal order to be provided by pcp/RN        SDOH assessments and interventions completed:  No     Care Coordination Interventions:  Yes, provided   Follow up plan: Follow up call scheduled for 09/04/22 1130    Encounter Outcome:  Pt. Visit Completed   Bijou Easler L. Lavina Hamman, RN, BSN, [place] Coordinator Office number [phone]

## 2022-07-28 NOTE — Patient Instructions (Signed)
Visit Information  Thank you for taking time to visit with me today. Please don't hesitate to contact me if I can be of assistance to you.   Following are the goals we discussed today:   Goals Addressed   None     Our next appointment is by telephone on 1 at 1  Please call the care guide team at [phone] if you need to cancel or reschedule your appointment.   If you are experiencing a Mental Health or [place] or need someone to talk to, please call the Suicide and Crisis Lifeline: 988 call the [place] National Suicide Prevention Lifeline: (681) 486-1441 or TTY: 508-540-8743 TTY (619)019-4209) to talk to a trained counselor call 1-800-273-TALK (toll free, 24 hour hotline) call the [facility]: [phone] call 911   Patient verbalizes understanding of instructions and care plan provided today and agrees to view in [place]. Active MyChart status and patient understanding of how to access instructions and care plan via MyChart confirmed with patient.     The patient has been provided with contact information for the care management team and has been advised to call with any health related questions or concerns.   Shauntee Karp L. Lavina Hamman, RN, BSN, [place] Coordinator Office number [phone]

## 2022-07-28 NOTE — Telephone Encounter (Signed)
Triple paste has been refilled. NA TO HOME PHONE

## 2022-07-29 ENCOUNTER — Other Ambulatory Visit: Payer: Self-pay

## 2022-07-29 ENCOUNTER — Other Ambulatory Visit: Payer: Self-pay | Admitting: Family Medicine

## 2022-07-29 ENCOUNTER — Ambulatory Visit (INDEPENDENT_AMBULATORY_CARE_PROVIDER_SITE_OTHER): Payer: Medicare Other | Admitting: Family Medicine

## 2022-07-29 ENCOUNTER — Telehealth: Payer: Self-pay

## 2022-07-29 DIAGNOSIS — J40 Bronchitis, not specified as acute or chronic: Secondary | ICD-10-CM

## 2022-07-29 DIAGNOSIS — J329 Chronic sinusitis, unspecified: Secondary | ICD-10-CM | POA: Diagnosis not present

## 2022-07-29 MED ORDER — CEFUROXIME AXETIL 250 MG PO TABS: 250.0000 mg | ORAL_TABLET | Freq: Two times a day (BID) | ORAL | 0 refills | Status: AC

## 2022-07-29 NOTE — Telephone Encounter (Signed)
I'm already double booked today and full tomorrow.  Can we see if there are any available spots in the office to do a televisit for him?

## 2022-07-29 NOTE — Telephone Encounter (Signed)
Patient has been scheduled with stacks

## 2022-07-29 NOTE — Progress Notes (Unsigned)
Subjective:    Patient ID: [name], male    DOB: [birth year], 88 y.o.   MRN: [id number]   HPI: [name] is a 88 y.o. male presenting for cold symptoms. Bad cough. Starting to get better.  A little short of breath. No fever. Some sputum production - stringy, snotty. No earache ST or HA. Hydrating well.       04/24/2022    1:18 PM 03/18/2022   11:47 AM 03/13/2022    3:27 PM 10/15/2021   10:11 AM 08/23/2021    1:09 PM  Depression screen PHQ 2/9  Decreased Interest 0 '3 3 3 2  '$ Down, Depressed, Hopeless '1 3 3 3 2  '$ PHQ - 2 Score '1 6 6 6 4  '$ Altered sleeping  3 3 0 1  Tired, decreased energy  '3 3 3 2  '$ Change in appetite  1 3 0 0  Feeling bad or failure about yourself   0 0 2 1  Trouble concentrating  2 3 0 1  Moving slowly or fidgety/restless  2 3 0 2  Suicidal thoughts  0 0 3 0  PHQ-9 Score  '17 21 14 11  '$ Difficult doing work/chores  Somewhat difficult Very difficult Somewhat difficult Somewhat difficult     Relevant past medical, surgical, family and social history reviewed and updated as indicated.  Interim medical history since our last visit reviewed. Allergies and medications reviewed and updated.  ROS:  Review of Systems  Constitutional:  Negative for activity change, appetite change, chills and fever.  HENT:  Positive for congestion, postnasal drip and rhinorrhea. Negative for ear discharge, ear pain, hearing loss, nosebleeds, sneezing and trouble swallowing.   Respiratory:  Negative for chest tightness and shortness of breath.   Cardiovascular:  Negative for chest pain and palpitations.  Musculoskeletal:  Negative for arthralgias.  Skin:  Negative for rash.     Social History   Tobacco Use  Smoking Status Former   Packs/day: 3.00   Years: 50.00   Total pack years: 150.00   Types: Cigarettes   Quit date: 08/25/1998   Years since quitting: 23.9   Passive exposure: Never  Smokeless Tobacco Former   Types: Chew   Quit date: 09/24/1998  Tobacco Comments    quit smoking cigarettes & chewing in  "2000"       Objective:     Wt Readings from Last 3 Encounters:  04/16/22 179 lb (81.2 kg)  04/06/22 172 lb 6.4 oz (78.2 kg)  03/18/22 177 lb (80.3 kg)     Exam deferred. Pt. Harboring due to COVID 19. Phone visit performed.   Assessment & Plan:   1. Sinobronchitis     Meds ordered this encounter  Medications   cefUROXime (CEFTIN) 250 MG tablet    Sig: Take 1 tablet (250 mg total) by mouth 2 (two) times daily with a meal for 10 days.    Dispense:  20 tablet    Refill:  0    No orders of the defined types were placed in this encounter.     Diagnoses and all orders for this visit:  Sinobronchitis  Other orders -     cefUROXime (CEFTIN) 250 MG tablet; Take 1 tablet (250 mg total) by mouth 2 (two) times daily with a meal for 10 days.    Virtual Visit via telephone Note  I discussed the limitations, risks, security and privacy concerns of performing an evaluation and management service by telephone and the availability  of in person appointments. The patient was identified with two identifiers. Pt.expressed understanding and agreed to proceed. Pt. Is at home. Dr. Livia Snellen is in his office.  Follow Up Instructions:   I discussed the assessment and treatment plan with the patient. The patient was provided an opportunity to ask questions and all were answered. The patient agreed with the plan and demonstrated an understanding of the instructions.   The patient was advised to call back or seek an in-person evaluation if the symptoms worsen or if the condition fails to improve as anticipated.   Total minutes including chart review and phone contact time: 7   Follow up plan: Return if symptoms worsen or fail to improve.  Claretta Fraise, MD [place]

## 2022-07-29 NOTE — Telephone Encounter (Signed)
Denisea called in asking why there was no refills in for prednisone or why nothing was sent in for patients cough. I let patient know I tried to call yesterday with no answer and no return call. I let her know prednisone is not something patients just sent in without a visit and the same with cough medication. Daughter states that he needs something to help him and is requesting a tele phone visit if that is what she has to do. States there is no way she can bring him in today and Is unsure about getting him transportation.

## 2022-07-30 ENCOUNTER — Other Ambulatory Visit: Payer: Self-pay | Admitting: Family Medicine

## 2022-07-30 ENCOUNTER — Encounter: Payer: Self-pay | Admitting: Family Medicine

## 2022-07-30 DIAGNOSIS — E1169 Type 2 diabetes mellitus with other specified complication: Secondary | ICD-10-CM

## 2022-07-30 MED ORDER — ONETOUCH VERIO VI STRP: ORAL_STRIP | 3 refills | Status: DC

## 2022-07-30 NOTE — Telephone Encounter (Signed)
Pt going to pharmacy at 10AM to pick up. Can we go ahead and send refills?

## 2022-07-30 NOTE — Addendum Note (Signed)
Addended by: Everlean Cherry on: 07/30/2022 11:52 AM   Modules accepted: Orders

## 2022-07-31 ENCOUNTER — Telehealth (INDEPENDENT_AMBULATORY_CARE_PROVIDER_SITE_OTHER): Payer: Medicare Other | Admitting: Family Medicine

## 2022-07-31 ENCOUNTER — Other Ambulatory Visit (HOSPITAL_COMMUNITY): Payer: Self-pay

## 2022-07-31 ENCOUNTER — Telehealth: Payer: Self-pay

## 2022-07-31 DIAGNOSIS — U071 COVID-19: Secondary | ICD-10-CM

## 2022-07-31 MED ORDER — MOLNUPIRAVIR EUA 200MG CAPSULE: 4.0000 | ORAL_CAPSULE | Freq: Two times a day (BID) | ORAL | 0 refills | Status: AC

## 2022-07-31 NOTE — Telephone Encounter (Signed)
Patient Advocate Encounter  Prior Authorization for Ipratropium-Albuterol 0.5-2.5 (3)MG/3ML solution has been cancelled. This medication or product is on your plan's list of covered drugs. Prior authorization is not required at this time.   PA# [id number] Key: [id number]

## 2022-07-31 NOTE — Telephone Encounter (Signed)
Pharmacy Patient Advocate Encounter PA renewal initiated by fax  Submitted a Prior Authorization request to [employer] for Ipratropium-Albuterol 0.5-2.5 (3)MG/3ML solution via CoverMyMeds. Will update once we receive a response.   Key: [id number]

## 2022-07-31 NOTE — Telephone Encounter (Signed)
Virtual Visit via telephone Note  I connected with [name] on 07/31/22  at 1358 by telephone and verified that I am speaking with the correct person using two identifiers. [name] is currently located at home and  daughter [name]  are currently with her during visit. The provider, Fransisca Kaufmann Renad Jenniges, MD is located in their office at time of visit.  Call ended at 1407  I discussed the limitations, risks, security and privacy concerns of performing an evaluation and management service by telephone and the availability of in person appointments. I also discussed with the patient that there may be a patient responsible charge related to this service. The patient expressed understanding and agreed to proceed.   History and Present Illness: Patient is having cough and congestion and wheezing.  He tested positive for covid 2 days ago.  His wife is sick as well. He was vaccinated as well. He is not having any worsening breathing.  His breathing is normal.  He has a nebulizer.  He has a nasal spray.   1. COVID-19 virus infection     Outpatient Encounter Medications as of 07/31/2022  Medication Sig   molnupiravir EUA (LAGEVRIO) 200 mg CAPS capsule Take 4 capsules (800 mg total) by mouth 2 (two) times daily for 5 days.   acetaminophen (TYLENOL) 160 MG/5ML solution Place 31.3 mLs (1,000 mg total) into feeding tube every 6 (six) hours. (Patient taking differently: Place 960 mg into feeding tube every 6 (six) hours. 30 mls - 960 mg)   albuterol (PROVENTIL HFA;VENTOLIN HFA) 108 (90 Base) MCG/ACT inhaler Inhale 2 puffs into the lungs every 6 (six) hours as needed for wheezing or shortness of breath.   alum & mag hydroxide-simeth (MAALOX/MYLANTA) 200-200-20 MG/5ML suspension Place 30 mLs into feeding tube every 4 (four) hours as needed for indigestion, heartburn or flatulence.   amiodarone (PACERONE) 200 MG tablet Place 1 tablet (200 mg total) into feeding tube daily.   apixaban (ELIQUIS) 5 MG  TABS tablet Place 1 tablet (5 mg total) into feeding tube 2 (two) times daily.   Apoaequorin (PREVAGEN PO) Take 1 tablet by mouth daily.   azelastine (ASTELIN) 0.1 % nasal spray Place 1 spray into both nostrils 2 (two) times daily.   bisacodyl (DULCOLAX) 10 MG suppository Place 1 suppository (10 mg total) rectally daily as needed for moderate constipation.   budesonide-formoterol (SYMBICORT) 160-4.5 MCG/ACT inhaler Inhale 2 puffs into the lungs 2 (two) times daily.   cefUROXime (CEFTIN) 250 MG tablet Take 1 tablet (250 mg total) by mouth 2 (two) times daily with a meal for 10 days.   Cholecalciferol (D3 2000 PO) 2,000 Units by Per J Tube route daily.   denosumab (PROLIA) 60 MG/ML SOSY injection Inject 60 mg into the skin every 6 (six) months.   diclofenac Sodium (VOLTAREN) 1 % GEL Apply 2 g topically 4 (four) times daily as needed (pain).   diphenoxylate-atropine (LOMOTIL) 2.5-0.025 MG/5ML liquid Place 5 mLs into feeding tube 4 (four) times daily as needed for diarrhea or loose stools.   doxazosin (CARDURA) 1 MG tablet PLACE 1 TABLET (1 MG TOTAL) INTO FEEDING TUBE DAILY.   famotidine (PEPCID) 20 MG tablet PLACE 1 TABLET (20 MG TOTAL) INTO FEEDING TUBE 2 (TWO) TIMES DAILY.   ferrous gluconate (FERGON) 240 (27 FE) MG tablet Take 240 mg by mouth every evening.   fluticasone (FLONASE) 50 MCG/ACT nasal spray Place 2 sprays into both nostrils daily as needed for allergies or rhinitis.  glucose blood (ONETOUCH VERIO) test strip Use as instructed   ipratropium-albuterol (DUONEB) 0.5-2.5 (3) MG/3ML SOLN INHALE 3 ML (CONTENTS OF 1 VIAL) BY NEBULIZATION 2 (TWO) TIMES DAILY.   Lancets (ONETOUCH ULTRASOFT) lancets Use to check blood sugars daily   lidocaine (LIDODERM) 5 % Place 1 patch onto the skin daily. Remove & Discard patch within 12 hours or as directed by MD   losartan (COZAAR) 25 MG tablet Take 0.5 tablets (12.5 mg total) by mouth daily.   meclizine (ANTIVERT) 25 MG tablet Take 1 tablet (25 mg total)  by mouth 3 (three) times daily as needed for dizziness.   MELATONIN PO Take 5-10 mg by mouth at bedtime.   metFORMIN (GLUCOPHAGE) 500 MG tablet TAKE 1 TABLET BY MOUTH DAILY  WITH BREAKFAST   mirtazapine (REMERON) 15 MG tablet Take 1 tablet (15 mg total) by mouth at bedtime.   nitrofurantoin (MACRODANTIN) 50 MG capsule PLACE 1 CAPSULE (50 MG TOTAL) INTO FEEDING TUBE AT BEDTIME.   nutrition supplement, JUVEN, (JUVEN) PACK Place 1 packet into feeding tube 2 (two) times daily between meals.   Nutritional Supplements (FEEDING SUPPLEMENT, JEVITY 1.5 CAL/FIBER,) LIQD Place 1,000 mLs into feeding tube daily. At 55 ml/hour (Patient taking differently: 90 mLs by Per J Tube route See admin instructions. At 90 ml/hour for 15 hours - usually started in the evening)   ondansetron (ZOFRAN-ODT) 8 MG disintegrating tablet Take 1 tablet (8 mg total) by mouth every 8 (eight) hours as needed for nausea or vomiting.   pantoprazole (PROTONIX) 20 MG tablet Take 2 tablets (40 mg total) by mouth every morning. Per tube   Potassium & Sodium Phosphates (PHOSPHORUS W/SOD & POTASSIUM) 280-160-250 MG PACK PLACE 1 PACKET INTO FEEDING TUBE 4 (FOUR) TIMES DAILY - WITH MEALS AND AT BEDTIME.   rosuvastatin (CRESTOR) 40 MG tablet TAKE 1 TABLET BY MOUTH DAILY   Spacer/Aero-Hold Chamber Bags MISC 1 each by Does not apply route as needed.   Water For Irrigation, Sterile (FREE WATER) SOLN Place 100 mLs into feeding tube every 4 (four) hours.   Zinc Oxide (TRIPLE PASTE) 12.8 % ointment Apply topically 2 (two) times daily.   No facility-administered encounter medications on file as of 07/31/2022.    Review of Systems - General ROS: positive for  - chills and fever negative for - night sweats ENT ROS: positive for - nasal congestion, sinus pain, sneezing, and sore throat Respiratory ROS: positive for - cough and wheezing Cardiovascular ROS: negative for - chest pain, palpitations, or shortness of breath    Observations/Objective: Patient sounds comfortable and no acute distress  Assessment and Plan: Problem List Items Addressed This Visit   None Visit Diagnoses     COVID-19 virus infection    -  Primary   Relevant Medications   molnupiravir EUA (LAGEVRIO) 200 mg CAPS capsule       Recommended Mucinex and cough syrup and gave antiviral medicine for COVID Follow up plan: No follow-ups on file.     I discussed the assessment and treatment plan with the patient. The patient was provided an opportunity to ask questions and all were answered. The patient agreed with the plan and demonstrated an understanding of the instructions.   The patient was advised to call back or seek an in-person evaluation if the symptoms worsen or if the condition fails to improve as anticipated.  The above assessment and management plan was discussed with the patient. The patient verbalized understanding of and has agreed to the management  plan. Patient is aware to call the clinic if symptoms persist or worsen. Patient is aware when to return to the clinic for a follow-up visit. Patient educated on when it is appropriate to go to the emergency department.    I provided 9 minutes of non-face-to-face time during this encounter.    Worthy Rancher, MD

## 2022-08-01 ENCOUNTER — Emergency Department (HOSPITAL_COMMUNITY)
Admission: EM | Admit: 2022-08-01 | Discharge: 2022-08-01 | Disposition: A | Discharge status: Home / Self Care | Payer: Medicare Other | Attending: Emergency Medicine | Admitting: Emergency Medicine

## 2022-08-01 ENCOUNTER — Other Ambulatory Visit: Payer: Self-pay | Admitting: Family Medicine

## 2022-08-01 ENCOUNTER — Encounter (HOSPITAL_COMMUNITY): Payer: Self-pay | Admitting: Emergency Medicine

## 2022-08-01 ENCOUNTER — Encounter: Payer: Medicare Other | Admitting: Cardiovascular Disease

## 2022-08-01 ENCOUNTER — Telehealth: Payer: Self-pay | Admitting: Family Medicine

## 2022-08-01 ENCOUNTER — Emergency Department (HOSPITAL_COMMUNITY): Payer: Medicare Other

## 2022-08-01 DIAGNOSIS — J45909 Unspecified asthma, uncomplicated: Secondary | ICD-10-CM | POA: Diagnosis not present

## 2022-08-01 DIAGNOSIS — R0602 Shortness of breath: Secondary | ICD-10-CM | POA: Diagnosis not present

## 2022-08-01 DIAGNOSIS — Z951 Presence of aortocoronary bypass graft: Secondary | ICD-10-CM | POA: Insufficient documentation

## 2022-08-01 DIAGNOSIS — Z8546 Personal history of malignant neoplasm of prostate: Secondary | ICD-10-CM | POA: Insufficient documentation

## 2022-08-01 DIAGNOSIS — R6889 Other general symptoms and signs: Secondary | ICD-10-CM | POA: Diagnosis not present

## 2022-08-01 DIAGNOSIS — N189 Chronic kidney disease, unspecified: Secondary | ICD-10-CM | POA: Insufficient documentation

## 2022-08-01 DIAGNOSIS — I129 Hypertensive chronic kidney disease with stage 1 through stage 4 chronic kidney disease, or unspecified chronic kidney disease: Secondary | ICD-10-CM | POA: Diagnosis not present

## 2022-08-01 DIAGNOSIS — U071 COVID-19: Secondary | ICD-10-CM | POA: Insufficient documentation

## 2022-08-01 DIAGNOSIS — Z79899 Other long term (current) drug therapy: Secondary | ICD-10-CM | POA: Insufficient documentation

## 2022-08-01 DIAGNOSIS — K9413 Enterostomy malfunction: Secondary | ICD-10-CM | POA: Diagnosis not present

## 2022-08-01 DIAGNOSIS — K9423 Gastrostomy malfunction: Secondary | ICD-10-CM | POA: Insufficient documentation

## 2022-08-01 DIAGNOSIS — Z743 Need for continuous supervision: Secondary | ICD-10-CM | POA: Diagnosis not present

## 2022-08-01 DIAGNOSIS — K9429 Other complications of gastrostomy: Secondary | ICD-10-CM | POA: Diagnosis not present

## 2022-08-01 DIAGNOSIS — Z789 Other specified health status: Secondary | ICD-10-CM

## 2022-08-01 DIAGNOSIS — Z7984 Long term (current) use of oral hypoglycemic drugs: Secondary | ICD-10-CM | POA: Diagnosis not present

## 2022-08-01 DIAGNOSIS — R531 Weakness: Secondary | ICD-10-CM | POA: Diagnosis not present

## 2022-08-01 DIAGNOSIS — Z4659 Encounter for fitting and adjustment of other gastrointestinal appliance and device: Secondary | ICD-10-CM | POA: Diagnosis not present

## 2022-08-01 DIAGNOSIS — G4733 Obstructive sleep apnea (adult) (pediatric): Secondary | ICD-10-CM | POA: Insufficient documentation

## 2022-08-01 DIAGNOSIS — J449 Chronic obstructive pulmonary disease, unspecified: Secondary | ICD-10-CM | POA: Diagnosis not present

## 2022-08-01 DIAGNOSIS — R059 Cough, unspecified: Secondary | ICD-10-CM | POA: Diagnosis not present

## 2022-08-01 DIAGNOSIS — E1122 Type 2 diabetes mellitus with diabetic chronic kidney disease: Secondary | ICD-10-CM | POA: Insufficient documentation

## 2022-08-01 DIAGNOSIS — R1084 Generalized abdominal pain: Secondary | ICD-10-CM | POA: Diagnosis not present

## 2022-08-01 DIAGNOSIS — Z7901 Long term (current) use of anticoagulants: Secondary | ICD-10-CM | POA: Diagnosis not present

## 2022-08-01 DIAGNOSIS — Z4682 Encounter for fitting and adjustment of non-vascular catheter: Secondary | ICD-10-CM | POA: Diagnosis not present

## 2022-08-01 DIAGNOSIS — I2510 Atherosclerotic heart disease of native coronary artery without angina pectoris: Secondary | ICD-10-CM | POA: Diagnosis not present

## 2022-08-01 DIAGNOSIS — Z7401 Bed confinement status: Secondary | ICD-10-CM | POA: Diagnosis not present

## 2022-08-01 HISTORY — PX: IR GJ TUBE CHANGE: IMG1440

## 2022-08-01 LAB — COMPREHENSIVE METABOLIC PANEL
ALT: 44 U/L (ref 0–44)
AST: 41 U/L (ref 15–41)
Albumin: 2.4 g/dL — ABNORMAL LOW (ref 3.5–5.0)
Alkaline Phosphatase: 59 U/L (ref 38–126)
Anion gap: 9 (ref 5–15)
BUN: 43 mg/dL — ABNORMAL HIGH (ref 8–23)
CO2: 26 mmol/L (ref 22–32)
Calcium: 9.9 mg/dL (ref 8.9–10.3)
Chloride: 103 mmol/L (ref 98–111)
Creatinine, Ser: 0.75 mg/dL (ref 0.61–1.24)
GFR, Estimated: >60 mL/min (ref >60)
Glucose, Bld: 137 mg/dL — ABNORMAL HIGH (ref 70–99)
Potassium: 5.1 mmol/L (ref 3.5–5.1)
Sodium: 138 mmol/L (ref 135–145)
Total Bilirubin: 0.5 mg/dL (ref 0.3–1.2)
Total Protein: 5 g/dL — ABNORMAL LOW (ref 6.5–8.1)

## 2022-08-01 LAB — CBC WITH DIFFERENTIAL/PLATELET
Abs Immature Granulocytes: 0 10*3/uL (ref 0.00–0.07)
Basophils Absolute: 0 10*3/uL (ref 0.0–0.1)
Basophils Relative: 0 %
Eosinophils Absolute: 0.1 10*3/uL (ref 0.0–0.5)
Eosinophils Relative: 1 %
HCT: 32.9 % — ABNORMAL LOW (ref 39.0–52.0)
Hemoglobin: 10 g/dL — ABNORMAL LOW (ref 13.0–17.0)
Lymphocytes Relative: 52 %
Lymphs Abs: 4.7 10*3/uL — ABNORMAL HIGH (ref 0.7–4.0)
MCH: 29.4 pg (ref 26.0–34.0)
MCHC: 30.4 g/dL (ref 30.0–36.0)
MCV: 96.8 fL (ref 80.0–100.0)
Monocytes Absolute: 0.8 10*3/uL (ref 0.1–1.0)
Monocytes Relative: 9 %
Neutro Abs: 3.4 10*3/uL (ref 1.7–7.7)
Neutrophils Relative %: 38 %
Platelets: 82 10*3/uL — ABNORMAL LOW (ref 150–400)
RBC: 3.4 MIL/uL — ABNORMAL LOW (ref 4.22–5.81)
RDW: 20.8 % — ABNORMAL HIGH (ref 11.5–15.5)
WBC: 9 10*3/uL (ref 4.0–10.5)
nRBC: 0 % (ref 0.0–0.2)

## 2022-08-01 LAB — RESP PANEL BY RT-PCR (FLU A&B, COVID) ARPGX2
Influenza A by PCR: NEGATIVE
Influenza B by PCR: NEGATIVE
SARS Coronavirus 2 by RT PCR: POSITIVE — AB

## 2022-08-01 LAB — LACTIC ACID, PLASMA: Lactic Acid, Venous: 2.4 mmol/L (ref 0.5–1.9)

## 2022-08-01 MED ORDER — IOHEXOL 300 MG/ML  SOLN
50.0000 mL | Freq: Once | INTRAMUSCULAR | Status: AC | PRN
  Administered 2022-08-01: 20 mL

## 2022-08-01 MED ORDER — IPRATROPIUM-ALBUTEROL 0.5-2.5 (3) MG/3ML IN SOLN
3.0000 mL | Freq: Once | RESPIRATORY_TRACT | Status: AC
  Administered 2022-08-01: 3 mL via RESPIRATORY_TRACT
  Filled 2022-08-01: qty 3

## 2022-08-01 MED ORDER — PREDNISONE 10 MG (21) PO TBPK: ORAL_TABLET | ORAL | 0 refills | Status: DC

## 2022-08-01 MED ORDER — PREDNISONE 20 MG PO TABS
40.0000 mg | ORAL_TABLET | Freq: Once | ORAL | Status: AC
  Administered 2022-08-01: 40 mg via ORAL
  Filled 2022-08-01: qty 2

## 2022-08-01 MED ORDER — LIDOCAINE VISCOUS HCL 2 % MT SOLN
OROMUCOSAL | Status: AC
  Administered 2022-08-01: 2 mL
  Filled 2022-08-01: qty 15

## 2022-08-01 NOTE — ED Provider Notes (Signed)
Patient accepted at change of shift, well-appearing, known COVID, has some wheezing associated with reactive airway disease anti-COVID, prednisone and albuterol given, prednisone burst for home, labs otherwise reassuring, gastrointestinal tube was changed successfully, family agreeable with treatment plan   Noemi Chapel, MD 08/01/22 1818

## 2022-08-01 NOTE — ED Notes (Signed)
Patient was incontinent to urine. Patient cleaned and changed. Patient is laying in bed with warm blankets provided.

## 2022-08-01 NOTE — Procedures (Signed)
Vascular and Interventional Radiology Procedure Note  Patient: [name] DOB: 1932/12/21 Medical Record Number: [id number] Note Date/Time: 08/01/22 3:57 PM   Performing Physician: Michaelle Birks, MD Assistant(s): None  Diagnosis: Catheter malfunction. and Leaking catheter.  Procedure: PERCUTANEOUS GASTROJEJUNOSTOMY TUBE EXCHANGE  Anesthesia: Local Anesthetic Complications: None Estimated Blood Loss: Minimal  Findings:  Successful exchange of a 46F gastrojejunostomy tube under fluoroscopy.   See detailed procedure note with images in PACS. The patient tolerated the procedure well without incident or complication and was returned to Floor Bed in stable condition.    Michaelle Birks, MD [facility]   Pager. [place]

## 2022-08-01 NOTE — ED Notes (Signed)
[facility] is here for transport. Iv has ben removed.

## 2022-08-01 NOTE — ED Notes (Signed)
[facility] called 

## 2022-08-01 NOTE — Discharge Instructions (Addendum)
Thank you for coming to [facility]. You were seen for cough and leaking from the [place] tube. We did an exam, labs, and imaging, and these showed a looped GJ tube. This was replaced by IR.    Please continue to take the molnupiravir as prescribed. Please also take the prednisone prescribed by Dr. Livia Snellen for 6 days. Please follow up with your primary care provider within 1 week.  Please quarantine for 7 days from the start of your covid symptoms.  Do not hesitate to return to the ED or call 911 if you experience: -Worsening symptoms -Chest pain, shortness of breath -Dislodged or nonfunctional GJ tube -Lightheadedness, passing out -Anything else that concerns you
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
## 2022-08-01 NOTE — ED Notes (Signed)
Patient is laying in bed, on room air with call bell within reach.

## 2022-08-01 NOTE — ED Triage Notes (Signed)
Pt having discharge around feeding tube with coughing. Recent COVID and continues to cough. Per EMS looks like bile. Pt has had previous stoke with left sided deficits. Speech is unclear.

## 2022-08-02 LAB — BRAIN NATRIURETIC PEPTIDE: B Natriuretic Peptide: 69.3 pg/mL (ref 0.0–100.0)

## 2022-08-04 ENCOUNTER — Encounter: Payer: Self-pay | Admitting: *Deleted

## 2022-08-04 ENCOUNTER — Telehealth: Payer: Self-pay | Admitting: *Deleted

## 2022-08-04 NOTE — Telephone Encounter (Signed)
Lmtcb.

## 2022-08-04 NOTE — Telephone Encounter (Signed)
-----   Message from [name], RN sent at 08/04/2022  4:21 PM EST ----- Regarding: Needs ED F/u ED visit for Covid on 08/01/22 at [facility]. Needs 1 week f/u scheduled.   Thanks! [name]

## 2022-08-04 NOTE — Patient Outreach (Signed)
  Care Coordination TOC Note Red EMMI ED Call Follow-up  Transition Care Management Follow-up Telephone Call Date of discharge and from where: [facility] on 08/01/22 How have you been since you were released from the hospital? Per daughter, he's stable. Continues to have a productive cough, but it isn't worse. He also continues to have some leakage around his GJ tube but that has lessened since being replaced in the ED. She's monitoring for increased leakage or any s/s of infection. Any questions or concerns? Yes. Needs some more dressing supplies from [facility]. Has a call into them, but supplies haven't been provided. Would like for [facility] to f/u.   Items Reviewed: Did the pt receive and understand the discharge instructions provided? Yes  Medications obtained and verified? Yes  Other? Yes . Red EMMI flag for no PCP f/u appt. Reviewed Lactic Acid results from ED visit per daughter's request.  Any new allergies since your discharge? No  Dietary orders reviewed? Yes Do you have support at home? Yes   Home Care and Equipment/Supplies: Were home health services ordered? not applicable If so, what is the name of the agency? Existing services through [facility] [facility]) Has the agency set up a time to come to the patient's home? Waiting on dressing supplies Were any new equipment or medical supplies ordered?  No What is the name of the medical supply agency?  Were you able to get the supplies/equipment? not applicable Do you have any questions related to the use of the equipment or supplies? No  Functional Questionnaire: (I = Independent and D = Dependent) ADLs: D  Bathing/Dressing- D  Meal Prep- D  Eating- D  Maintaining continence- D  Transferring/Ambulation- D  Managing Meds- D  Follow up appointments reviewed:  PCP Hospital f/u appt confirmed? No   Specialist Hospital f/u appt confirmed?  Not indicated.   Are transportation arrangements needed? No  If their  condition worsens, is the pt aware to call PCP or go to the Emergency Dept.? Yes Was the patient provided with contact information for the PCP's office or ED? Yes Was to pt encouraged to call back with questions or concerns? Yes  SDOH assessments and interventions completed:   Yes SDOH Interventions Today    Flowsheet Row Most Recent Value  SDOH Interventions   Transportation Interventions Intervention Not Indicated  Financial Strain Interventions Intervention Not Indicated       Care Coordination Interventions:  PCP follow up appointment requested   Encounter Outcome:  Pt. Visit Completed    Chong Sicilian, BSN, RN-BC RN Care Coordinator [place] Direct Dial: [phone] Main #: [phone]

## 2022-08-05 LAB — PATHOLOGIST SMEAR REVIEW

## 2022-08-06 DIAGNOSIS — E43 Unspecified severe protein-calorie malnutrition: Secondary | ICD-10-CM | POA: Diagnosis not present

## 2022-08-06 DIAGNOSIS — R1310 Dysphagia, unspecified: Secondary | ICD-10-CM | POA: Diagnosis not present

## 2022-08-06 DIAGNOSIS — Z931 Gastrostomy status: Secondary | ICD-10-CM | POA: Diagnosis not present

## 2022-08-06 DIAGNOSIS — G9341 Metabolic encephalopathy: Secondary | ICD-10-CM | POA: Diagnosis not present

## 2022-08-06 DIAGNOSIS — E119 Type 2 diabetes mellitus without complications: Secondary | ICD-10-CM | POA: Diagnosis not present

## 2022-08-12 DIAGNOSIS — I69359 Hemiplegia and hemiparesis following cerebral infarction affecting unspecified side: Secondary | ICD-10-CM | POA: Diagnosis not present

## 2022-08-12 DIAGNOSIS — L89153 Pressure ulcer of sacral region, stage 3: Secondary | ICD-10-CM | POA: Diagnosis not present

## 2022-08-15 ENCOUNTER — Other Ambulatory Visit: Payer: Self-pay | Admitting: Family Medicine

## 2022-08-15 ENCOUNTER — Other Ambulatory Visit: Payer: Self-pay | Admitting: Cardiology

## 2022-08-15 DIAGNOSIS — Z931 Gastrostomy status: Secondary | ICD-10-CM

## 2022-08-18 ENCOUNTER — Other Ambulatory Visit: Payer: Self-pay | Admitting: Family Medicine

## 2022-08-18 DIAGNOSIS — E1142 Type 2 diabetes mellitus with diabetic polyneuropathy: Secondary | ICD-10-CM

## 2022-08-19 DIAGNOSIS — R1310 Dysphagia, unspecified: Secondary | ICD-10-CM | POA: Diagnosis not present

## 2022-08-19 DIAGNOSIS — G9341 Metabolic encephalopathy: Secondary | ICD-10-CM | POA: Diagnosis not present

## 2022-08-19 DIAGNOSIS — Z4800 Encounter for change or removal of nonsurgical wound dressing: Secondary | ICD-10-CM | POA: Diagnosis not present

## 2022-08-19 DIAGNOSIS — E119 Type 2 diabetes mellitus without complications: Secondary | ICD-10-CM | POA: Diagnosis not present

## 2022-08-19 DIAGNOSIS — Z931 Gastrostomy status: Secondary | ICD-10-CM | POA: Diagnosis not present

## 2022-08-19 DIAGNOSIS — E43 Unspecified severe protein-calorie malnutrition: Secondary | ICD-10-CM | POA: Diagnosis not present

## 2022-08-25 DIAGNOSIS — R1310 Dysphagia, unspecified: Secondary | ICD-10-CM | POA: Diagnosis not present

## 2022-08-26 DIAGNOSIS — G9341 Metabolic encephalopathy: Secondary | ICD-10-CM | POA: Diagnosis not present

## 2022-08-26 DIAGNOSIS — Z9189 Other specified personal risk factors, not elsewhere classified: Secondary | ICD-10-CM | POA: Diagnosis not present

## 2022-08-26 DIAGNOSIS — R1310 Dysphagia, unspecified: Secondary | ICD-10-CM | POA: Diagnosis not present

## 2022-08-26 DIAGNOSIS — Z931 Gastrostomy status: Secondary | ICD-10-CM | POA: Diagnosis not present

## 2022-08-26 DIAGNOSIS — E119 Type 2 diabetes mellitus without complications: Secondary | ICD-10-CM | POA: Diagnosis not present

## 2022-08-27 ENCOUNTER — Telehealth (INDEPENDENT_AMBULATORY_CARE_PROVIDER_SITE_OTHER): Payer: Medicare Other | Admitting: Family Medicine

## 2022-08-27 ENCOUNTER — Encounter: Payer: Self-pay | Admitting: Family Medicine

## 2022-08-27 DIAGNOSIS — I480 Paroxysmal atrial fibrillation: Secondary | ICD-10-CM | POA: Diagnosis not present

## 2022-08-27 DIAGNOSIS — R627 Adult failure to thrive: Secondary | ICD-10-CM | POA: Diagnosis not present

## 2022-08-27 DIAGNOSIS — N1831 Chronic kidney disease, stage 3a: Secondary | ICD-10-CM | POA: Diagnosis not present

## 2022-08-27 DIAGNOSIS — R682 Dry mouth, unspecified: Secondary | ICD-10-CM | POA: Diagnosis not present

## 2022-08-27 DIAGNOSIS — Z9189 Other specified personal risk factors, not elsewhere classified: Secondary | ICD-10-CM

## 2022-08-27 DIAGNOSIS — Z931 Gastrostomy status: Secondary | ICD-10-CM | POA: Diagnosis not present

## 2022-08-27 DIAGNOSIS — F418 Other specified anxiety disorders: Secondary | ICD-10-CM

## 2022-08-27 DIAGNOSIS — L89153 Pressure ulcer of sacral region, stage 3: Secondary | ICD-10-CM | POA: Diagnosis not present
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
## 2022-08-28 ENCOUNTER — Emergency Department (HOSPITAL_COMMUNITY)
Admission: EM | Admit: 2022-08-28 | Discharge: 2022-08-28 | Disposition: A | Discharge status: Home / Self Care | Payer: Medicare Other | Attending: Emergency Medicine | Admitting: Emergency Medicine

## 2022-08-28 ENCOUNTER — Other Ambulatory Visit: Payer: Self-pay

## 2022-08-28 ENCOUNTER — Emergency Department (HOSPITAL_COMMUNITY): Payer: Medicare Other

## 2022-08-28 DIAGNOSIS — G4730 Sleep apnea, unspecified: Secondary | ICD-10-CM | POA: Insufficient documentation

## 2022-08-28 DIAGNOSIS — K9423 Gastrostomy malfunction: Secondary | ICD-10-CM | POA: Insufficient documentation

## 2022-08-28 DIAGNOSIS — Z7901 Long term (current) use of anticoagulants: Secondary | ICD-10-CM | POA: Diagnosis not present

## 2022-08-28 DIAGNOSIS — T85528A Displacement of other gastrointestinal prosthetic devices, implants and grafts, initial encounter: Secondary | ICD-10-CM

## 2022-08-28 DIAGNOSIS — R404 Transient alteration of awareness: Secondary | ICD-10-CM | POA: Diagnosis not present

## 2022-08-28 DIAGNOSIS — K9413 Enterostomy malfunction: Secondary | ICD-10-CM | POA: Diagnosis not present

## 2022-08-28 DIAGNOSIS — Z7401 Bed confinement status: Secondary | ICD-10-CM | POA: Diagnosis not present

## 2022-08-28 HISTORY — PX: IR GJ TUBE CHANGE: IMG1440

## 2022-08-28 LAB — CBC WITH DIFFERENTIAL/PLATELET
Abs Immature Granulocytes: 0 10*3/uL (ref 0.00–0.07)
Basophils Absolute: 0 10*3/uL (ref 0.0–0.1)
Basophils Relative: 0 %
Eosinophils Absolute: 0 10*3/uL (ref 0.0–0.5)
Eosinophils Relative: 0 %
HCT: 33.6 % — ABNORMAL LOW (ref 39.0–52.0)
Hemoglobin: 10.5 g/dL — ABNORMAL LOW (ref 13.0–17.0)
Lymphocytes Relative: 55 %
Lymphs Abs: 6.9 10*3/uL — ABNORMAL HIGH (ref 0.7–4.0)
MCH: 30.3 pg (ref 26.0–34.0)
MCHC: 31.3 g/dL (ref 30.0–36.0)
MCV: 96.8 fL (ref 80.0–100.0)
Monocytes Absolute: 0.6 10*3/uL (ref 0.1–1.0)
Monocytes Relative: 5 %
Neutro Abs: 5 10*3/uL (ref 1.7–7.7)
Neutrophils Relative %: 40 %
Platelets: 95 10*3/uL — ABNORMAL LOW (ref 150–400)
RBC: 3.47 MIL/uL — ABNORMAL LOW (ref 4.22–5.81)
RDW: 20.3 % — ABNORMAL HIGH (ref 11.5–15.5)
Smear Review: DECREASED
WBC: 12.6 10*3/uL — ABNORMAL HIGH (ref 4.0–10.5)
nRBC: 0 % (ref 0.0–0.2)

## 2022-08-28 LAB — COMPREHENSIVE METABOLIC PANEL
ALT: 24 U/L (ref 0–44)
AST: 21 U/L (ref 15–41)
Albumin: 2.6 g/dL — ABNORMAL LOW (ref 3.5–5.0)
Alkaline Phosphatase: 54 U/L (ref 38–126)
Anion gap: 10 (ref 5–15)
BUN: 47 mg/dL — ABNORMAL HIGH (ref 8–23)
CO2: 29 mmol/L (ref 22–32)
Calcium: 9.8 mg/dL (ref 8.9–10.3)
Chloride: 99 mmol/L (ref 98–111)
Creatinine, Ser: 0.64 mg/dL (ref 0.61–1.24)
GFR, Estimated: >60 mL/min (ref >60)
Glucose, Bld: 122 mg/dL — ABNORMAL HIGH (ref 70–99)
Potassium: 4.7 mmol/L (ref 3.5–5.1)
Sodium: 138 mmol/L (ref 135–145)
Total Bilirubin: 0.6 mg/dL (ref 0.3–1.2)
Total Protein: 5.2 g/dL — ABNORMAL LOW (ref 6.5–8.1)

## 2022-08-28 MED ORDER — LOSARTAN POTASSIUM 25 MG PO TABS
12.5000 mg | ORAL_TABLET | Freq: Every day | ORAL | Status: DC
  Filled 2022-08-28: qty 0.5

## 2022-08-28 MED ORDER — IPRATROPIUM-ALBUTEROL 0.5-2.5 (3) MG/3ML IN SOLN
3.0000 mL | Freq: Two times a day (BID) | RESPIRATORY_TRACT | Status: DC
  Administered 2022-08-28: 3 mL via RESPIRATORY_TRACT
  Filled 2022-08-28: qty 3

## 2022-08-28 MED ORDER — ACETAMINOPHEN 160 MG/5ML PO SOLN
960.0000 mg | Freq: Four times a day (QID) | ORAL | Status: DC
  Administered 2022-08-28: 960 mg
  Filled 2022-08-28: qty 40.6

## 2022-08-28 MED ORDER — APIXABAN 5 MG PO TABS
5.0000 mg | ORAL_TABLET | Freq: Two times a day (BID) | ORAL | Status: DC
  Administered 2022-08-28: 5 mg
  Filled 2022-08-28: qty 1

## 2022-08-28 MED ORDER — NITROFURANTOIN MACROCRYSTAL 50 MG PO CAPS
50.0000 mg | ORAL_CAPSULE | Freq: Every day | ORAL | Status: DC
  Filled 2022-08-28: qty 1

## 2022-08-28 MED ORDER — METFORMIN HCL 500 MG PO TABS: 500.0000 mg | ORAL_TABLET | Freq: Every morning | ORAL | Status: DC

## 2022-08-28 MED ORDER — PANTOPRAZOLE SODIUM 40 MG IV SOLR
40.0000 mg | INTRAVENOUS | Status: DC
  Administered 2022-08-28: 40 mg via INTRAVENOUS
  Filled 2022-08-28: qty 10

## 2022-08-28 MED ORDER — FAMOTIDINE 20 MG PO TABS
20.0000 mg | ORAL_TABLET | Freq: Two times a day (BID) | ORAL | Status: DC
  Administered 2022-08-28: 20 mg
  Filled 2022-08-28: qty 1

## 2022-08-28 MED ORDER — LIDOCAINE VISCOUS HCL 2 % MT SOLN
OROMUCOSAL | Status: AC
  Administered 2022-08-28: 3 mL
  Filled 2022-08-28: qty 15

## 2022-08-28 MED ORDER — DOXAZOSIN MESYLATE 1 MG PO TABS
1.0000 mg | ORAL_TABLET | Freq: Every day | ORAL | Status: DC
  Filled 2022-08-28: qty 1

## 2022-08-28 MED ORDER — PREDNISONE 5 MG PO TABS: 10.0000 mg | ORAL_TABLET | Freq: Every morning | ORAL | Status: DC

## 2022-08-28 MED ORDER — PHOSPHORUS W/SOD & POTASSIUM 280-160-250 MG PO PACK: 1.0000 | PACK | Freq: Three times a day (TID) | ORAL | Status: DC

## 2022-08-28 MED ORDER — IOHEXOL 300 MG/ML  SOLN
50.0000 mL | Freq: Once | INTRAMUSCULAR | Status: AC | PRN
  Administered 2022-08-28: 20 mL

## 2022-08-28 MED ORDER — JUVEN PO PACK
1.0000 | PACK | Freq: Two times a day (BID) | ORAL | Status: DC
  Filled 2022-08-28: qty 1

## 2022-08-28 MED ORDER — AMIODARONE HCL 200 MG PO TABS
200.0000 mg | ORAL_TABLET | Freq: Every day | ORAL | Status: DC
  Administered 2022-08-28: 200 mg
  Filled 2022-08-28: qty 1

## 2022-08-28 MED ORDER — PANTOPRAZOLE SODIUM 40 MG PO TBEC: 40.0000 mg | DELAYED_RELEASE_TABLET | ORAL | Status: DC

## 2022-08-28 MED ORDER — LACTATED RINGERS IV BOLUS
1000.0000 mL | Freq: Once | INTRAVENOUS | Status: AC
  Administered 2022-08-28: 1000 mL via INTRAVENOUS

## 2022-08-28 MED ORDER — FREE WATER
100.0000 mL | Status: DC
  Administered 2022-08-28: 100 mL

## 2022-08-28 MED ORDER — ONDANSETRON 4 MG PO TBDP
8.0000 mg | ORAL_TABLET | Freq: Three times a day (TID) | ORAL | Status: DC | PRN
  Administered 2022-08-28: 8 mg via ORAL
  Filled 2022-08-28: qty 2

## 2022-08-28 MED ORDER — JEVITY 1.5 CAL/FIBER PO LIQD: 1350.0000 mL | ORAL | Status: DC

## 2022-08-28 MED ORDER — JEVITY 1.2 CAL PO LIQD
1000.0000 mL | ORAL | Status: DC
  Filled 2022-08-28: qty 1000

## 2022-08-28 MED ORDER — FLUCONAZOLE 150 MG PO TABS: 150.0000 mg | ORAL_TABLET | Freq: Every day | ORAL | 0 refills | Status: DC

## 2022-08-28 NOTE — ED Notes (Signed)
PTAR called; 10th in line

## 2022-08-28 NOTE — ED Notes (Signed)
PT to IR

## 2022-08-28 NOTE — ED Notes (Signed)
Pt bp 85/49 map of 58, Notified charge nurse Johnney Ou advises for pt to remain in hallway.

## 2022-08-28 NOTE — ED Notes (Signed)
Transfer of care report given to ED RN, [employer].

## 2022-08-28 NOTE — ED Provider Notes (Signed)
    ED Course / MDM   Clinical Course as of 08/28/22 1534  Thu Aug 28, 2022  0707 Received sign out from Dr. Dayna Barker. Needs G/J tube replacement. Plan to call IR at 8 AM [WS]  4290 Discussed with IR, they will perform tube change.  [WS]  1533 Signed out pending tube change.  [WS]  1534 Signed out to Dr. Johnney Killian.  [WS]    Clinical Course User Index [WS] Cristie Hem, MD   Medical Decision Making Amount and/or Complexity of Data Reviewed Labs: ordered.          Cristie Hem, MD 08/28/22 1534

## 2022-08-28 NOTE — ED Notes (Signed)
Pt resting in gurney, NAD Noted at this time. Support person at bedside.

## 2022-08-28 NOTE — ED Provider Notes (Signed)
[facility] EMERGENCY DEPARTMENT Provider Note   CSN: [id number] Arrival date & time: 08/28/22  0358     History  Chief Complaint  Patient presents with   pulled out g tube     [name] is a 89 y.o. male.  Brought in by family secondary to gastrojejunostomy tube displacement.  Otherwise has been in his normal state of health.  Son-in-law states that he might feel a bit dehydrated as he does not take anything by mouth and they are not sure how long the [place] tube has been out.  No other complaints.        Home Medications Prior to Admission medications   Medication Sig Start Date End Date Taking? Authorizing Provider  acetaminophen (TYLENOL) 160 MG/5ML solution Place 31.3 mLs (1,000 mg total) into feeding tube every 6 (six) hours. Patient taking differently: Place 960 mg into feeding tube every 6 (six) hours. 30 mls - 960 mg 01/21/22   Nicole Kindred A, DO  albuterol (PROVENTIL HFA;VENTOLIN HFA) 108 (90 Base) MCG/ACT inhaler Inhale 2 puffs into the lungs every 6 (six) hours as needed for wheezing or shortness of breath. 02/12/18   [name], [name]  alum & mag hydroxide-simeth (MAALOX/MYLANTA) 200-200-20 MG/5ML suspension Place 30 mLs into feeding tube every 4 (four) hours as needed for indigestion, heartburn or flatulence. 01/21/22   Ezekiel Slocumb, DO  amiodarone (PACERONE) 200 MG tablet Place 1 tablet (200 mg total) into feeding tube daily. 07/21/22   Arnoldo Lenis, MD  apixaban (ELIQUIS) 5 MG TABS tablet Place 1 tablet (5 mg total) into feeding tube 2 (two) times daily. 01/21/22   Ezekiel Slocumb, DO  Apoaequorin (PREVAGEN PO) Take 1 tablet by mouth daily.    [provider]  azelastine (ASTELIN) 0.1 % nasal spray Place 1 spray into both nostrils 2 (two) times daily. 10/15/21   [name], DO  bisacodyl (DULCOLAX) 10 MG suppository Place 1 suppository (10 mg total) rectally daily as needed for moderate constipation. 12/17/21    Winferd Humphrey, PA-C  budesonide-formoterol (SYMBICORT) 160-4.5 MCG/ACT inhaler Inhale 2 puffs into the lungs 2 (two) times daily. 05/26/22   [name], DO  Cholecalciferol (D3 2000 PO) 2,000 Units by Per J Tube route daily.    [provider]  denosumab (PROLIA) 60 MG/ML SOSY injection Inject 60 mg into the skin every 6 (six) months.    [provider]  diclofenac Sodium (VOLTAREN) 1 % GEL Apply 2 g topically 4 (four) times daily as needed (pain). 04/25/22   [name], DO  diphenoxylate-atropine (LOMOTIL) 2.5-0.025 MG/5ML liquid Place 5 mLs into feeding tube 4 (four) times daily as needed for diarrhea or loose stools. 02/19/22   Lavina Hamman, MD  doxazosin (CARDURA) 1 MG tablet PLACE 1 TABLET (1 MG TOTAL) INTO FEEDING TUBE DAILY. 08/15/22   [name], DO  famotidine (PEPCID) 20 MG tablet Place 1 tablet (20 mg total) into feeding tube 2 (two) times daily. 08/15/22 10/14/22  [name], DO  ferrous gluconate (FERGON) 240 (27 FE) MG tablet Take 240 mg by mouth every evening.    [provider]  fluticasone (FLONASE) 50 MCG/ACT nasal spray Place 2 sprays into both nostrils daily as needed for allergies or rhinitis.    [provider]  glucose blood (ONETOUCH VERIO) test strip Use as instructed 07/30/22   Ronnie Doss M, DO  ipratropium-albuterol (DUONEB) 0.5-2.5 (3) MG/3ML SOLN INHALE 3 ML (  CONTENTS OF 1 VIAL) BY NEBULIZATION 2 (TWO) TIMES DAILY. 06/11/22   [name], DO  Lancets (ONETOUCH ULTRASOFT) lancets Use to check blood sugars daily 10/16/17   [name], [name], [name]  lidocaine (LIDODERM) 5 % Place 1 patch onto the skin daily. Remove & Discard patch within 12 hours or as directed by MD 04/25/22   [name], DO  losartan (COZAAR) 25 MG tablet TAKE 1/2 TABLET BY MOUTH EVERY DAY 08/15/22   Arnoldo Lenis, MD  meclizine (ANTIVERT) 25 MG tablet Take 1 tablet (25 mg total) by mouth 3 (three) times  daily as needed for dizziness. 05/13/21   Sherwood Gambler, MD  MELATONIN PO Take 5-10 mg by mouth at bedtime.    [provider]  metFORMIN (GLUCOPHAGE) 500 MG tablet TAKE 1 TABLET BY MOUTH DAILY  WITH BREAKFAST 08/19/22   Ronnie Doss M, DO  mirtazapine (REMERON) 15 MG tablet TAKE 1 TABLET BY MOUTH EVERYDAY AT BEDTIME 08/15/22   Ronnie Doss M, DO  nitrofurantoin (MACRODANTIN) 50 MG capsule PLACE 1 CAPSULE (50 MG TOTAL) INTO FEEDING TUBE AT BEDTIME. 07/28/22   Irine Seal, MD  nutrition supplement, JUVEN, (JUVEN) PACK Place 1 packet into feeding tube 2 (two) times daily between meals. 02/19/22   Lavina Hamman, MD  Nutritional Supplements (FEEDING SUPPLEMENT, JEVITY 1.5 CAL/FIBER,) LIQD Place 1,000 mLs into feeding tube daily. At 55 ml/hour Patient taking differently: 90 mLs by Per J Tube route See admin instructions. At 90 ml/hour for 15 hours - usually started in the evening 02/19/22   Lavina Hamman, MD  ondansetron (ZOFRAN-ODT) 8 MG disintegrating tablet Take 1 tablet (8 mg total) by mouth every 8 (eight) hours as needed for nausea or vomiting. 05/26/22   [name], DO  pantoprazole (PROTONIX) 20 MG tablet Take 2 tablets (40 mg total) by mouth every morning. Per tube 05/26/22   Ronnie Doss M, DO  Potassium & Sodium Phosphates (PHOSPHORUS W/SOD & POTASSIUM) 280-160-250 MG PACK PLACE 1 PACKET INTO FEEDING TUBE 4 (FOUR) TIMES DAILY - WITH MEALS AND AT BEDTIME. 08/01/22   Gottschalk, Ashly M, DO  predniSONE (STERAPRED UNI-PAK 21 TAB) 10 MG (21) TBPK tablet As directed x 6 days 08/01/22   Baruch Gouty, [name]  rosuvastatin (CRESTOR) 40 MG tablet TAKE 1 TABLET BY MOUTH DAILY 07/30/22   [name], DO  Spacer/Aero-Hold Chamber Bags MISC 1 each by Does not apply route as needed. 02/19/22   Lavina Hamman, MD  TRIPLE PASTE 12.8 % ointment APPLY TOPICALLY TWICE A DAY 08/15/22   Ronnie Doss M, DO  Water For Irrigation, Sterile (FREE WATER) SOLN Place 100 mLs into  feeding tube every 4 (four) hours. 02/19/22   Lavina Hamman, MD      Allergies    Penicillin v potassium, Penicillins, Tramadol, and Ketorolac tromethamine    Review of Systems   Review of Systems  Physical Exam Updated Vital Signs BP (!) 112/55 (BP Location: Right Arm)   Pulse 81   Resp 20   SpO2 93%  Physical Exam Vitals and nursing note reviewed.  Constitutional:      Appearance: He is well-developed.  HENT:     Head: Normocephalic and atraumatic.     Mouth/Throat:     Mouth: Mucous membranes are dry.  Eyes:     Pupils: Pupils are equal, round, and reactive to light.  Cardiovascular:     Rate and Rhythm: Normal rate.  Pulmonary:     Effort:  Pulmonary effort is normal. No respiratory distress.  Abdominal:     General: Abdomen is flat. There is no distension.     Comments: GJ tube partially in the ostomy.  No evidence of severe trauma in the area.  Abdomen is soft nontender.  Musculoskeletal:        General: Normal range of motion.     Cervical back: Normal range of motion.  Skin:    General: Skin is warm and dry.  Neurological:     General: No focal deficit present.     Mental Status: He is alert.     ED Results / Procedures / Treatments   Labs (all labs ordered are listed, but only abnormal results are displayed) Labs Reviewed  CBC WITH DIFFERENTIAL/PLATELET - Abnormal; Notable for the following components:      Result Value   WBC 12.6 (*)    RBC 3.47 (*)    Hemoglobin 10.5 (*)    HCT 33.6 (*)    RDW 20.3 (*)    Platelets 95 (*)    All other components within normal limits  COMPREHENSIVE METABOLIC PANEL - Abnormal; Notable for the following components:   Glucose, Bld 122 (*)    BUN 47 (*)    Total Protein 5.2 (*)    Albumin 2.6 (*)    All other components within normal limits    EKG None  Radiology No results found.  Procedures Procedures    Medications Ordered in ED Medications  lactated ringers bolus 1,000 mL (1,000 mLs Intravenous New  Bag/Given 08/28/22 0557)    ED Course/ Medical Decision Making/ A&P Clinical Course as of 08/28/22 0743  Thu Aug 28, 2022  0707 Received sign out from Dr. Dayna Barker. Needs G/J tube replacement. Plan to call IR at 8 AM [WS]    Clinical Course User Index [WS] Cristie Hem, MD                           Medical Decision Making Amount and/or Complexity of Data Reviewed Labs: ordered.   I discussed with radiology and they suggest consulting them at 0 800 when the day team gets here.  They stated I could put something in the stoma if I wanted to but it was not necessary. There is not recorded but his initial blood pressure was slightly soft in the mid 80s over 40s.  Was not tachycardic.  Overall appeared well.  I suspect this is more consistent with dehydration after fluids his blood pressure is consistently above 081 systolic.  CBC and BMP were reassuring no evidence of acute kidney injury or any other sequelae of low blood pressure.  Care transferred pending GJ replacement. Suspect he can be discharged if BP's remain normal and no complications from procedure.   Final Clinical Impression(s) / ED Diagnoses Final diagnoses:  None    Rx / DC Orders ED Discharge Orders     None         August Longest, Corene Cornea, MD 08/28/22 [phone]

## 2022-08-28 NOTE — ED Notes (Signed)
This RN assumed care of patient. Pt arrived to ED for gtube replacement. Pt sleeping at this time with [place] at 2 lpm  d/t sleep apnea, otherwise NAD and breaths are equal and unlabored bilaterally. Pt skin tone is appropriate for ethnicity, dry and warm. Support person at bedside.

## 2022-08-28 NOTE — ED Notes (Signed)
Pharmacy at bedside

## 2022-08-28 NOTE — ED Triage Notes (Signed)
Pt bib gcems from home. Family stated that patient pulled feeding tube out and would like it replaced. Pt alert and oriented to self. Bp- 139/61 72 hr 96% RA 182 cbg

## 2022-08-28 NOTE — ED Provider Notes (Addendum)
IR replacing GJ tube.  Discharge once complete. Physical Exam  BP (!) 112/55 (BP Location: Right Arm)   Pulse 81   Resp 20   SpO2 93%   Physical Exam  Procedures  Procedures  ED Course / MDM   Clinical Course as of 08/28/22 1534  Thu Aug 28, 2022  0707 Received sign out from Dr. Dayna Barker. Needs G/J tube replacement. Plan to call IR at 8 AM [WS]  6811 Discussed with IR, they will perform tube change.  [WS]  1533 Signed out pending tube change.  [WS]  1534 Signed out to Dr. Johnney Killian.  [WS]    Clinical Course User Index [WS] Cristie Hem, MD   Medical Decision Making Amount and/or Complexity of Data Reviewed Labs: ordered.  Risk OTC drugs. Prescription drug management.   IR has completed tube replacement.  Positioning good per their report.  Patient ready for discharge.  Patient may have prolonged wait time for [facility] transport.  Patient's daughter has brought an updated medications list so patient may be given medications and feeds.  Patient's daughter reports patient has thrush in the mouth and requires intermittent Diflucan prescriptions.  A prescription provided per daughter's request.  Patient does have white plaque in the mouth.       Charlesetta Shanks, MD 08/28/22 1644    Charlesetta Shanks, MD 08/28/22 2109

## 2022-08-28 NOTE — ED Notes (Addendum)
Pt family asked about mediations. This nurse spoke with pharmacy to verify home mediation with family.

## 2022-08-28 NOTE — ED Notes (Addendum)
[employer] engineer notified about need for PT[facility] ride home. Family notified.

## 2022-08-28 NOTE — ED Notes (Signed)
PT resting in gurney, family and pt updated. NAD noted at this time. Support person at bedside.

## 2022-08-28 NOTE — ED Notes (Signed)
Pt's family has been updated.

## 2022-08-28 NOTE — Discharge Instructions (Signed)
1.  You may resume use of the gastrojejunostomy tube. 2.  Continue care as per previously. 3.  Have a recheck with the patient's doctor as needed.

## 2022-08-29 LAB — PATHOLOGIST SMEAR REVIEW

## 2022-09-01 ENCOUNTER — Other Ambulatory Visit: Payer: Self-pay | Admitting: Family Medicine

## 2022-09-01 DIAGNOSIS — Z931 Gastrostomy status: Secondary | ICD-10-CM

## 2022-09-04 ENCOUNTER — Ambulatory Visit: Payer: Self-pay | Admitting: *Deleted

## 2022-09-04 ENCOUNTER — Encounter: Payer: Self-pay | Admitting: *Deleted

## 2022-09-04 NOTE — Patient Instructions (Signed)
Visit Information  Thank you for taking time to visit with me today. Please don't hesitate to contact me if I can be of assistance to you.   Following are the goals we discussed today:   Goals Addressed             This Visit's Progress    COMPLETED: HHealth/Transportation       Care Coordination Interventions: UPDATE This RN care manager spoke with [facility] [place], [place]Deborah [phone] who indicated agency was not able to contact the pt or daughter [name] to continue services and this case was closed in Dec 2023. At this time pt would need new orders for [facility] needs from the provider. Orders have been requested from the primary provider's office as indicated by the pt's daughter [name]. Advised patient to f/u with provider's office on Friday and [facility] early next week concerning the requested HHealth referral for PT/OT/Nurse aide and RN for wound care (G-Tube). Reviewed medications with patient and discussed adherence with no needed refills Collaborated with Dr. Lajuana Ripple (in-basket) regarding daughter [name] 's request for [facility] services as noted above. Social Work referral for Transportation in the [place] ([facility]) area. Spoke with [name] concerning the pt's needs (Wheelchair bound) Assessed social determinant of health barriers Educated daughter [name] on care management services involving social worker, Consulting civil engineer and pharmacy . Addressed all needs as noted above related to daughter's request with no additional needs presented at this time.  Will provider contact number for [facility] for any future issues or needs along with this RN care manager.         Please call the care guide team at [phone] if you need to cancel or reschedule your appointment.   If you are experiencing a Mental Health or [place] or need someone to talk to, please call the Suicide and Crisis Lifeline: 988  Patient verbalizes understanding of  instructions and care plan provided today and agrees to view in [place]. Active MyChart status and patient understanding of how to access instructions and care plan via MyChart confirmed with patient.     No further follow up required: No needs  Raina Mina, RN Care Management Coordinator [place] Office [phone]

## 2022-09-04 NOTE — Patient Outreach (Addendum)
  Care Coordination   Initial Visit Note   09/04/2022 Name: [name] MRN: [id number] DOB: 02-Mar-1933  [name] is a 89 y.o. year old male who sees Calvin Norlander, DO for primary care. I  spoke with DPR daughter [name] today.  What matters to the patients health and wellness today?  HHealth/Transportation    Goals Addressed             This Visit's Progress    COMPLETED: HHealth/Transportation       Care Coordination Interventions: UPDATE This RN care manager spoke with [facility] [place], [place][name] [phone] who indicated agency was not able to contact the pt or daughter [name] to continue services and this case was closed in Dec 2023. At this time pt would need new orders for [facility] needs from the provider. Orders have been requested from the primary provider's office as indicated by the pt's daughter [name]. Advised patient to f/u with provider's office on Friday and [facility] early next week concerning the requested HHealth referral for PT/OT/Nurse aide and RN for wound care (G-Tube). Reviewed medications with patient and discussed adherence with no needed refills Collaborated with Dr. Lajuana Hunter (in-basket) regarding daughter [name] 's request for [facility] services as noted above. Social Work referral for Transportation in the [place] ([facility]) area. Spoke with [name] concerning the pt's needs (Wheelchair bound) Assessed social determinant of health barriers Educated daughter [name] on care management services involving social worker, Consulting civil engineer and pharmacy . Addressed all needs as noted above related to daughter's request with no additional needs presented at this time.  Will provider contact number for [facility] for any future issues or needs along with this RN care manager.         SDOH assessments and interventions completed:  Yes  SDOH Interventions Today    Flowsheet Row Most Recent Value  SDOH Interventions   Food  Insecurity Interventions Intervention Not Indicated  Housing Interventions Intervention Not Indicated  Transportation Interventions Other (Comment)  [Referral made to care-guide for resources on transportation]  Utilities Interventions Intervention Not Indicated        Care Coordination Interventions:  Yes, provided   Follow up plan: No further intervention required.   Encounter Outcome:  Pt. Visit Completed   Calvin Mina, RN Care Management Coordinator [place] Office [phone]

## 2022-09-06 DIAGNOSIS — E43 Unspecified severe protein-calorie malnutrition: Secondary | ICD-10-CM | POA: Diagnosis not present

## 2022-09-06 DIAGNOSIS — Z931 Gastrostomy status: Secondary | ICD-10-CM | POA: Diagnosis not present

## 2022-09-06 DIAGNOSIS — G9341 Metabolic encephalopathy: Secondary | ICD-10-CM | POA: Diagnosis not present

## 2022-09-06 DIAGNOSIS — R1310 Dysphagia, unspecified: Secondary | ICD-10-CM | POA: Diagnosis not present

## 2022-09-06 DIAGNOSIS — E119 Type 2 diabetes mellitus without complications: Secondary | ICD-10-CM | POA: Diagnosis not present

## 2022-09-08 ENCOUNTER — Ambulatory Visit: Payer: Self-pay

## 2022-09-08 NOTE — Patient Outreach (Signed)
  Care Coordination   09/08/2022 Name: [name] MRN: [id number] DOB: [birth year]   Care Coordination Outreach Attempts:  An unsuccessful telephone outreach was attempted today to offer the patient information about available care coordination services as a benefit of their health plan.   Follow Up Plan:  Additional outreach attempts will be made to offer the patient care coordination information and services.   Encounter Outcome:  No Answer   Care Coordination Interventions:  No, not indicated    Daneen Schick, BSW, CDP Social Worker, Certified Dementia Practitioner [place] [facility]  Care Coordination [phone]

## 2022-09-09 ENCOUNTER — Other Ambulatory Visit: Payer: Self-pay | Admitting: *Deleted

## 2022-09-09 NOTE — Telephone Encounter (Signed)
Entresto denied due to being discontinued 01/21/2022 at discharge

## 2022-09-10 ENCOUNTER — Ambulatory Visit: Payer: Self-pay

## 2022-09-10 NOTE — Patient Outreach (Signed)
  Care Coordination   Follow Up Visit Note   09/10/2022 Name: REMY VOILES MRN: [id number] DOB: Sep 02, 1932  SHAKA CARDIN is a 89 y.o. year old male who sees [name], DO for primary care. I  spoke with patients daughter [name] by phone.  What matters to the patients health and wellness today?  Transportation and home health orders    Goals Addressed             This Visit's Progress    COMPLETED: Care Coordination Activities       Care Coordination Interventions: Spoke with patients daughter [name] who indicates she was told the patients health plan would no longer provide transportation in the new plan year Encouraged [name] to contact patients health plan to confirm considering he has not switched carriers and they have provided transportation for several years Provided contact number to SW should she need transportation assistance in the future Discussed [name] would like orders for home health PT/OT and skilled nursing for dressing changes sent to [facility] will send a request to the patients provider, [name] to follow up over the next week on status of orders Collaboration with Dr. Lajuana Ripple regarding above requested orders         SDOH assessments and interventions completed:  No     Care Coordination Interventions:  Yes, provided   Follow up plan: No further intervention required.   Encounter Outcome:  Pt. Visit Completed   Daneen Schick, BSW, CDP Social Worker, Certified Dementia Practitioner [place] [facility]  Care Coordination [phone]

## 2022-09-10 NOTE — Patient Instructions (Signed)
Visit Information  Thank you for taking time to visit with me today. Please don't hesitate to contact me if I can be of assistance to you.   Following are the goals we discussed today:   Goals Addressed             This Visit's Progress    COMPLETED: Care Coordination Activities       Care Coordination Interventions: Spoke with patients daughter [name] who indicates she was told the patients health plan would no longer provide transportation in the new plan year Encouraged [name] to contact patients health plan to confirm considering he has not switched carriers and they have provided transportation for several years Provided contact number to SW should she need transportation assistance in the future Discussed [name] would like orders for home health PT/OT and skilled nursing for dressing changes sent to [facility] will send a request to the patients provider, [name] to follow up over the next week on status of orders Collaboration with Dr. Lajuana Ripple regarding above requested orders         If you are experiencing a Mental Health or [place] or need someone to talk to, please call 911  Patient verbalizes understanding of instructions and care plan provided today and agrees to view in [place]. Active MyChart status and patient understanding of how to access instructions and care plan via MyChart confirmed with patient.     No further follow up required: Please contact your primary care provider as needed.  Daneen Schick, BSW, CDP Social Worker, Certified Dementia Practitioner [place] [facility]  Care Coordination [phone]

## 2022-09-12 ENCOUNTER — Other Ambulatory Visit: Payer: Self-pay | Admitting: Family Medicine

## 2022-09-12 ENCOUNTER — Ambulatory Visit: Payer: Medicare Other | Attending: Cardiovascular Disease

## 2022-09-12 DIAGNOSIS — I441 Atrioventricular block, second degree: Secondary | ICD-10-CM | POA: Diagnosis not present

## 2022-09-12 DIAGNOSIS — L89153 Pressure ulcer of sacral region, stage 3: Secondary | ICD-10-CM | POA: Diagnosis not present

## 2022-09-12 DIAGNOSIS — I69359 Hemiplegia and hemiparesis following cerebral infarction affecting unspecified side: Secondary | ICD-10-CM | POA: Diagnosis not present

## 2022-09-12 LAB — CUP PACEART REMOTE DEVICE CHECK
Battery Remaining Longevity: 79 mo
Battery Remaining Percentage: 66 %
Battery Voltage: 2.99 V
Brady Statistic AP VP Percent: 21 %
Brady Statistic AP VS Percent: 1 %
Brady Statistic AS VP Percent: 79 %
Brady Statistic AS VS Percent: 1 %
Brady Statistic RA Percent Paced: 21 %
Brady Statistic RV Percent Paced: 99 %
Date Time Interrogation Session: 20240119020013
Implantable Lead Connection Status: 753985
Implantable Lead Connection Status: 753985
Implantable Lead Implant Date: 20200630
Implantable Lead Implant Date: 20200630
Implantable Lead Location: 753859
Implantable Lead Location: 753860
Implantable Pulse Generator Implant Date: 20200630
Lead Channel Impedance Value: 510 Ohm
Lead Channel Impedance Value: 530 Ohm
Lead Channel Pacing Threshold Amplitude: 0.625 V
Lead Channel Pacing Threshold Amplitude: 0.75 V
Lead Channel Pacing Threshold Pulse Width: 0.5 ms
Lead Channel Pacing Threshold Pulse Width: 0.5 ms
Lead Channel Sensing Intrinsic Amplitude: 1 mV
Lead Channel Sensing Intrinsic Amplitude: 11.8 mV
Lead Channel Setting Pacing Amplitude: 0.875
Lead Channel Setting Pacing Amplitude: 2 V
Lead Channel Setting Pacing Pulse Width: 0.5 ms
Lead Channel Setting Sensing Sensitivity: 4 mV
Pulse Gen Model: 2272
Pulse Gen Serial Number: 3311958

## 2022-09-13 ENCOUNTER — Other Ambulatory Visit: Payer: Self-pay | Admitting: Family Medicine

## 2022-09-17 ENCOUNTER — Telehealth: Payer: Self-pay | Admitting: Family Medicine

## 2022-09-17 DIAGNOSIS — R1310 Dysphagia, unspecified: Secondary | ICD-10-CM | POA: Diagnosis not present

## 2022-09-17 NOTE — Telephone Encounter (Signed)
This rx was prescribed by Sharyn Lull over a month and a half ago.  Needs OV for refills on this med.  It is not a chronic med.

## 2022-09-17 NOTE — Telephone Encounter (Signed)
  Prescription Request  09/17/2022   What is the name of the medication or equipment? PREDNISONE  Have you contacted your pharmacy to request a refill? YES  Which pharmacy would you like this sent to? [facility]

## 2022-09-17 NOTE — Addendum Note (Signed)
Addended by: [name] on: 09/17/2022 05:02 PM   Modules accepted: Orders

## 2022-09-19 ENCOUNTER — Other Ambulatory Visit: Payer: Self-pay

## 2022-09-19 ENCOUNTER — Encounter (HOSPITAL_COMMUNITY): Payer: Self-pay | Admitting: Emergency Medicine

## 2022-09-19 ENCOUNTER — Inpatient Hospital Stay (HOSPITAL_COMMUNITY)
Admission: EM | Admit: 2022-09-19 | Discharge: 2022-09-26 | DRG: 871 | Disposition: A | Discharge status: Hospice | Payer: Medicare Other | Attending: Internal Medicine | Admitting: Internal Medicine

## 2022-09-19 DIAGNOSIS — R6521 Severe sepsis with septic shock: Secondary | ICD-10-CM | POA: Diagnosis present

## 2022-09-19 DIAGNOSIS — Z1152 Encounter for screening for COVID-19: Secondary | ICD-10-CM | POA: Diagnosis not present

## 2022-09-19 DIAGNOSIS — Z951 Presence of aortocoronary bypass graft: Secondary | ICD-10-CM

## 2022-09-19 DIAGNOSIS — F32A Depression, unspecified: Secondary | ICD-10-CM | POA: Diagnosis not present

## 2022-09-19 DIAGNOSIS — I69398 Other sequelae of cerebral infarction: Secondary | ICD-10-CM

## 2022-09-19 DIAGNOSIS — E1122 Type 2 diabetes mellitus with diabetic chronic kidney disease: Secondary | ICD-10-CM | POA: Diagnosis not present

## 2022-09-19 DIAGNOSIS — Z515 Encounter for palliative care: Secondary | ICD-10-CM | POA: Diagnosis not present

## 2022-09-19 DIAGNOSIS — E119 Type 2 diabetes mellitus without complications: Secondary | ICD-10-CM | POA: Diagnosis not present

## 2022-09-19 DIAGNOSIS — E870 Hyperosmolality and hypernatremia: Secondary | ICD-10-CM | POA: Diagnosis present

## 2022-09-19 DIAGNOSIS — Z85828 Personal history of other malignant neoplasm of skin: Secondary | ICD-10-CM

## 2022-09-19 DIAGNOSIS — J690 Pneumonitis due to inhalation of food and vomit: Secondary | ICD-10-CM | POA: Diagnosis not present

## 2022-09-19 DIAGNOSIS — G9341 Metabolic encephalopathy: Secondary | ICD-10-CM | POA: Diagnosis present

## 2022-09-19 DIAGNOSIS — I5042 Chronic combined systolic (congestive) and diastolic (congestive) heart failure: Secondary | ICD-10-CM | POA: Diagnosis not present

## 2022-09-19 DIAGNOSIS — E781 Pure hyperglyceridemia: Secondary | ICD-10-CM | POA: Diagnosis not present

## 2022-09-19 DIAGNOSIS — E860 Dehydration: Secondary | ICD-10-CM | POA: Diagnosis not present

## 2022-09-19 DIAGNOSIS — Z931 Gastrostomy status: Secondary | ICD-10-CM | POA: Diagnosis not present

## 2022-09-19 DIAGNOSIS — Z8546 Personal history of malignant neoplasm of prostate: Secondary | ICD-10-CM

## 2022-09-19 DIAGNOSIS — A419 Sepsis, unspecified organism: Principal | ICD-10-CM | POA: Diagnosis present

## 2022-09-19 DIAGNOSIS — Z8042 Family history of malignant neoplasm of prostate: Secondary | ICD-10-CM

## 2022-09-19 DIAGNOSIS — J189 Pneumonia, unspecified organism: Secondary | ICD-10-CM | POA: Diagnosis not present

## 2022-09-19 DIAGNOSIS — I2510 Atherosclerotic heart disease of native coronary artery without angina pectoris: Secondary | ICD-10-CM | POA: Diagnosis present

## 2022-09-19 DIAGNOSIS — Z950 Presence of cardiac pacemaker: Secondary | ICD-10-CM

## 2022-09-19 DIAGNOSIS — I130 Hypertensive heart and chronic kidney disease with heart failure and stage 1 through stage 4 chronic kidney disease, or unspecified chronic kidney disease: Secondary | ICD-10-CM | POA: Diagnosis not present

## 2022-09-19 DIAGNOSIS — Z9189 Other specified personal risk factors, not elsewhere classified: Secondary | ICD-10-CM | POA: Diagnosis not present

## 2022-09-19 DIAGNOSIS — D649 Anemia, unspecified: Secondary | ICD-10-CM | POA: Diagnosis not present

## 2022-09-19 DIAGNOSIS — Z7189 Other specified counseling: Secondary | ICD-10-CM | POA: Diagnosis not present

## 2022-09-19 DIAGNOSIS — J9811 Atelectasis: Secondary | ICD-10-CM | POA: Diagnosis not present

## 2022-09-19 DIAGNOSIS — L89106 Pressure-induced deep tissue damage of unspecified part of back: Secondary | ICD-10-CM | POA: Diagnosis present

## 2022-09-19 DIAGNOSIS — Z7952 Long term (current) use of systemic steroids: Secondary | ICD-10-CM

## 2022-09-19 DIAGNOSIS — Z800 Family history of malignant neoplasm of digestive organs: Secondary | ICD-10-CM

## 2022-09-19 DIAGNOSIS — R4182 Altered mental status, unspecified: Principal | ICD-10-CM

## 2022-09-19 DIAGNOSIS — D696 Thrombocytopenia, unspecified: Secondary | ICD-10-CM | POA: Diagnosis not present

## 2022-09-19 DIAGNOSIS — F05 Delirium due to known physiological condition: Secondary | ICD-10-CM | POA: Diagnosis not present

## 2022-09-19 DIAGNOSIS — R1310 Dysphagia, unspecified: Secondary | ICD-10-CM | POA: Diagnosis present

## 2022-09-19 DIAGNOSIS — K219 Gastro-esophageal reflux disease without esophagitis: Secondary | ICD-10-CM | POA: Diagnosis present

## 2022-09-19 DIAGNOSIS — N1831 Chronic kidney disease, stage 3a: Secondary | ICD-10-CM | POA: Diagnosis not present

## 2022-09-19 DIAGNOSIS — Z7401 Bed confinement status: Secondary | ICD-10-CM

## 2022-09-19 DIAGNOSIS — Z7951 Long term (current) use of inhaled steroids: Secondary | ICD-10-CM

## 2022-09-19 DIAGNOSIS — Z66 Do not resuscitate: Secondary | ICD-10-CM | POA: Diagnosis not present

## 2022-09-19 DIAGNOSIS — Z825 Family history of asthma and other chronic lower respiratory diseases: Secondary | ICD-10-CM

## 2022-09-19 DIAGNOSIS — I480 Paroxysmal atrial fibrillation: Secondary | ICD-10-CM | POA: Diagnosis not present

## 2022-09-19 DIAGNOSIS — R0603 Acute respiratory distress: Secondary | ICD-10-CM | POA: Diagnosis not present

## 2022-09-19 DIAGNOSIS — Z7984 Long term (current) use of oral hypoglycemic drugs: Secondary | ICD-10-CM

## 2022-09-19 DIAGNOSIS — N289 Disorder of kidney and ureter, unspecified: Secondary | ICD-10-CM | POA: Diagnosis not present

## 2022-09-19 DIAGNOSIS — G4733 Obstructive sleep apnea (adult) (pediatric): Secondary | ICD-10-CM | POA: Diagnosis present

## 2022-09-19 DIAGNOSIS — N179 Acute kidney failure, unspecified: Secondary | ICD-10-CM | POA: Diagnosis not present

## 2022-09-19 DIAGNOSIS — J449 Chronic obstructive pulmonary disease, unspecified: Secondary | ICD-10-CM | POA: Diagnosis present

## 2022-09-19 DIAGNOSIS — R0602 Shortness of breath: Secondary | ICD-10-CM | POA: Diagnosis not present

## 2022-09-19 DIAGNOSIS — R000 Tachycardia, unspecified: Secondary | ICD-10-CM | POA: Diagnosis not present

## 2022-09-19 DIAGNOSIS — R627 Adult failure to thrive: Secondary | ICD-10-CM | POA: Diagnosis present

## 2022-09-19 DIAGNOSIS — Z8249 Family history of ischemic heart disease and other diseases of the circulatory system: Secondary | ICD-10-CM

## 2022-09-19 DIAGNOSIS — Z7901 Long term (current) use of anticoagulants: Secondary | ICD-10-CM

## 2022-09-19 DIAGNOSIS — J9601 Acute respiratory failure with hypoxia: Secondary | ICD-10-CM | POA: Diagnosis not present

## 2022-09-19 DIAGNOSIS — Z923 Personal history of irradiation: Secondary | ICD-10-CM

## 2022-09-19 DIAGNOSIS — E43 Unspecified severe protein-calorie malnutrition: Secondary | ICD-10-CM | POA: Diagnosis not present

## 2022-09-19 NOTE — ED Triage Notes (Signed)
  Patient BIB EMS from home for respiratory distress and unresponsiveness.  EMS states patient was guppie breathing and was given 2 neb treatments in route.  Patient became hypotensive and altered so levophed drip was started by EMS.  Patient on NRB at this time.

## 2022-09-20 ENCOUNTER — Emergency Department (HOSPITAL_COMMUNITY): Payer: Medicare Other

## 2022-09-20 ENCOUNTER — Inpatient Hospital Stay (HOSPITAL_COMMUNITY): Payer: Medicare Other

## 2022-09-20 DIAGNOSIS — E870 Hyperosmolality and hypernatremia: Secondary | ICD-10-CM | POA: Diagnosis present

## 2022-09-20 DIAGNOSIS — J189 Pneumonia, unspecified organism: Secondary | ICD-10-CM

## 2022-09-20 DIAGNOSIS — R4182 Altered mental status, unspecified: Secondary | ICD-10-CM | POA: Diagnosis not present

## 2022-09-20 DIAGNOSIS — Z515 Encounter for palliative care: Secondary | ICD-10-CM | POA: Diagnosis not present

## 2022-09-20 DIAGNOSIS — I480 Paroxysmal atrial fibrillation: Secondary | ICD-10-CM | POA: Diagnosis present

## 2022-09-20 DIAGNOSIS — A419 Sepsis, unspecified organism: Principal | ICD-10-CM

## 2022-09-20 DIAGNOSIS — E119 Type 2 diabetes mellitus without complications: Secondary | ICD-10-CM | POA: Diagnosis not present

## 2022-09-20 DIAGNOSIS — Z7189 Other specified counseling: Secondary | ICD-10-CM | POA: Diagnosis not present

## 2022-09-20 DIAGNOSIS — Z9189 Other specified personal risk factors, not elsewhere classified: Secondary | ICD-10-CM | POA: Diagnosis not present

## 2022-09-20 DIAGNOSIS — R6521 Severe sepsis with septic shock: Secondary | ICD-10-CM | POA: Diagnosis present

## 2022-09-20 DIAGNOSIS — N179 Acute kidney failure, unspecified: Secondary | ICD-10-CM | POA: Diagnosis present

## 2022-09-20 DIAGNOSIS — J690 Pneumonitis due to inhalation of food and vomit: Secondary | ICD-10-CM | POA: Diagnosis present

## 2022-09-20 DIAGNOSIS — G9341 Metabolic encephalopathy: Secondary | ICD-10-CM | POA: Diagnosis present

## 2022-09-20 DIAGNOSIS — F32A Depression, unspecified: Secondary | ICD-10-CM | POA: Diagnosis present

## 2022-09-20 DIAGNOSIS — R627 Adult failure to thrive: Secondary | ICD-10-CM | POA: Diagnosis not present

## 2022-09-20 DIAGNOSIS — J9601 Acute respiratory failure with hypoxia: Secondary | ICD-10-CM | POA: Diagnosis present

## 2022-09-20 DIAGNOSIS — Z931 Gastrostomy status: Secondary | ICD-10-CM | POA: Diagnosis not present

## 2022-09-20 DIAGNOSIS — N1831 Chronic kidney disease, stage 3a: Secondary | ICD-10-CM | POA: Diagnosis present

## 2022-09-20 DIAGNOSIS — D696 Thrombocytopenia, unspecified: Secondary | ICD-10-CM | POA: Diagnosis present

## 2022-09-20 DIAGNOSIS — Z1152 Encounter for screening for COVID-19: Secondary | ICD-10-CM | POA: Diagnosis not present

## 2022-09-20 DIAGNOSIS — E860 Dehydration: Secondary | ICD-10-CM | POA: Diagnosis present

## 2022-09-20 DIAGNOSIS — R1310 Dysphagia, unspecified: Secondary | ICD-10-CM | POA: Diagnosis not present

## 2022-09-20 DIAGNOSIS — Z66 Do not resuscitate: Secondary | ICD-10-CM | POA: Diagnosis present

## 2022-09-20 DIAGNOSIS — E781 Pure hyperglyceridemia: Secondary | ICD-10-CM | POA: Diagnosis present

## 2022-09-20 DIAGNOSIS — I5042 Chronic combined systolic (congestive) and diastolic (congestive) heart failure: Secondary | ICD-10-CM | POA: Diagnosis present

## 2022-09-20 DIAGNOSIS — J449 Chronic obstructive pulmonary disease, unspecified: Secondary | ICD-10-CM | POA: Diagnosis present

## 2022-09-20 DIAGNOSIS — E1122 Type 2 diabetes mellitus with diabetic chronic kidney disease: Secondary | ICD-10-CM | POA: Diagnosis present

## 2022-09-20 DIAGNOSIS — F05 Delirium due to known physiological condition: Secondary | ICD-10-CM | POA: Diagnosis not present

## 2022-09-20 DIAGNOSIS — I130 Hypertensive heart and chronic kidney disease with heart failure and stage 1 through stage 4 chronic kidney disease, or unspecified chronic kidney disease: Secondary | ICD-10-CM | POA: Diagnosis present

## 2022-09-20 DIAGNOSIS — I69398 Other sequelae of cerebral infarction: Secondary | ICD-10-CM | POA: Diagnosis not present

## 2022-09-20 DIAGNOSIS — D649 Anemia, unspecified: Secondary | ICD-10-CM | POA: Diagnosis present

## 2022-09-20 LAB — CORTISOL: Cortisol, Plasma: 16.4 ug/dL

## 2022-09-20 LAB — RESPIRATORY PANEL BY PCR

## 2022-09-20 LAB — MAGNESIUM
Magnesium: 2 mg/dL (ref 1.7–2.4)
Magnesium: 1.9 mg/dL (ref 1.7–2.4)

## 2022-09-20 LAB — LACTIC ACID, PLASMA
Lactic Acid, Venous: 2.4 mmol/L (ref 0.5–1.9)
Lactic Acid, Venous: 3.5 mmol/L (ref 0.5–1.9)
Lactic Acid, Venous: 2.3 mmol/L (ref 0.5–1.9)
Lactic Acid, Venous: 1.5 mmol/L (ref 0.5–1.9)

## 2022-09-20 LAB — COMPREHENSIVE METABOLIC PANEL
ALT: 19 U/L (ref 0–44)
AST: 19 U/L (ref 15–41)
Albumin: 2.4 g/dL — ABNORMAL LOW (ref 3.5–5.0)
Alkaline Phosphatase: 57 U/L (ref 38–126)
Anion gap: 8 (ref 5–15)
BUN: 82 mg/dL — ABNORMAL HIGH (ref 8–23)
CO2: 29 mmol/L (ref 22–32)
Calcium: 10.3 mg/dL (ref 8.9–10.3)
Chloride: 111 mmol/L (ref 98–111)
Creatinine, Ser: 1.07 mg/dL (ref 0.61–1.24)
GFR, Estimated: >60 mL/min (ref >60)
Glucose, Bld: 271 mg/dL — ABNORMAL HIGH (ref 70–99)
Potassium: 3.8 mmol/L (ref 3.5–5.1)
Sodium: 148 mmol/L — ABNORMAL HIGH (ref 135–145)
Total Bilirubin: 0.6 mg/dL (ref 0.3–1.2)
Total Protein: 5.2 g/dL — ABNORMAL LOW (ref 6.5–8.1)

## 2022-09-20 LAB — CBC WITH DIFFERENTIAL/PLATELET
Abs Immature Granulocytes: 0 10*3/uL (ref 0.00–0.07)
Band Neutrophils: 1 %
Basophils Absolute: 0 10*3/uL (ref 0.0–0.1)
Basophils Relative: 0 %
Eosinophils Absolute: 0 10*3/uL (ref 0.0–0.5)
Eosinophils Relative: 0 %
HCT: 35.6 % — ABNORMAL LOW (ref 39.0–52.0)
Hemoglobin: 10.8 g/dL — ABNORMAL LOW (ref 13.0–17.0)
Lymphocytes Relative: 14 %
Lymphs Abs: 2.5 10*3/uL (ref 0.7–4.0)
MCH: 30.5 pg (ref 26.0–34.0)
MCHC: 30.3 g/dL (ref 30.0–36.0)
MCV: 100.6 fL — ABNORMAL HIGH (ref 80.0–100.0)
Monocytes Absolute: 0.7 10*3/uL (ref 0.1–1.0)
Monocytes Relative: 4 %
Neutro Abs: 7.3 10*3/uL (ref 1.7–7.7)
Neutrophils Relative %: 39 %
Other: 42 %
Platelets: 110 10*3/uL — ABNORMAL LOW (ref 150–400)
RBC: 3.54 MIL/uL — ABNORMAL LOW (ref 4.22–5.81)
RDW: 19.8 % — ABNORMAL HIGH (ref 11.5–15.5)
WBC Morphology: ABNORMAL
WBC: 18.2 10*3/uL — ABNORMAL HIGH (ref 4.0–10.5)
nRBC: 0 % (ref 0.0–0.2)

## 2022-09-20 LAB — PROTIME-INR
INR: 1.8 — ABNORMAL HIGH (ref 0.8–1.2)
Prothrombin Time: 21.1 seconds — ABNORMAL HIGH (ref 11.4–15.2)

## 2022-09-20 LAB — APTT: aPTT: 35 seconds (ref 24–36)

## 2022-09-20 LAB — URINALYSIS, W/ REFLEX TO CULTURE (INFECTION SUSPECTED)
Bacteria, UA: NONE SEEN
Bilirubin Urine: NEGATIVE
Glucose, UA: NEGATIVE mg/dL
Ketones, ur: NEGATIVE mg/dL
Nitrite: NEGATIVE
Protein, ur: 30 mg/dL — AB
Specific Gravity, Urine: 1.039 — ABNORMAL HIGH (ref 1.005–1.030)
pH: 6 (ref 5.0–8.0)

## 2022-09-20 LAB — RESP PANEL BY RT-PCR (RSV, FLU A&B, COVID)  RVPGX2
Influenza A by PCR: NEGATIVE
Influenza B by PCR: NEGATIVE
Resp Syncytial Virus by PCR: NEGATIVE
SARS Coronavirus 2 by RT PCR: NEGATIVE

## 2022-09-20 LAB — GLUCOSE, CAPILLARY
Glucose-Capillary: 142 mg/dL — ABNORMAL HIGH (ref 70–99)
Glucose-Capillary: 160 mg/dL — ABNORMAL HIGH (ref 70–99)
Glucose-Capillary: 188 mg/dL — ABNORMAL HIGH (ref 70–99)
Glucose-Capillary: 258 mg/dL — ABNORMAL HIGH (ref 70–99)
Glucose-Capillary: 98 mg/dL (ref 70–99)

## 2022-09-20 LAB — PROCALCITONIN: Procalcitonin: <0.1 ng/mL

## 2022-09-20 LAB — PHOSPHORUS
Phosphorus: 3 mg/dL (ref 2.5–4.6)
Phosphorus: 2.6 mg/dL (ref 2.5–4.6)

## 2022-09-20 LAB — BRAIN NATRIURETIC PEPTIDE: B Natriuretic Peptide: 229.6 pg/mL — ABNORMAL HIGH (ref 0.0–100.0)

## 2022-09-20 LAB — STREP PNEUMONIAE URINARY ANTIGEN: Strep Pneumo Urinary Antigen: NEGATIVE

## 2022-09-20 LAB — HEMOGLOBIN A1C
Hgb A1c MFr Bld: 6.8 % — ABNORMAL HIGH (ref 4.8–5.6)
Mean Plasma Glucose: 148.46 mg/dL

## 2022-09-20 LAB — AMMONIA: Ammonia: <10 umol/L (ref 9–35)

## 2022-09-20 LAB — MRSA NEXT GEN BY PCR, NASAL: MRSA by PCR Next Gen: NOT DETECTED

## 2022-09-20 MED ORDER — CHLORHEXIDINE GLUCONATE CLOTH 2 % EX PADS
6.0000 | MEDICATED_PAD | Freq: Every day | CUTANEOUS | Status: DC
  Administered 2022-09-20 – 2022-09-25 (×5): 6 via TOPICAL

## 2022-09-20 MED ORDER — PREDNISONE 10 MG PO TABS: 10.0000 mg | ORAL_TABLET | Freq: Every day | ORAL | Status: DC

## 2022-09-20 MED ORDER — FAMOTIDINE 20 MG PO TABS
20.0000 mg | ORAL_TABLET | Freq: Every day | ORAL | Status: DC
  Administered 2022-09-20 – 2022-09-25 (×6): 20 mg
  Filled 2022-09-20 (×6): qty 1

## 2022-09-20 MED ORDER — NOREPINEPHRINE 4 MG/250ML-% IV SOLN
2.0000 ug/min | INTRAVENOUS | Status: DC
  Administered 2022-09-20: 7 ug/min via INTRAVENOUS
  Administered 2022-09-20 (×2): 8 ug/min via INTRAVENOUS
  Administered 2022-09-21: 7 ug/min via INTRAVENOUS
  Filled 2022-09-20 (×3): qty 250

## 2022-09-20 MED ORDER — VITAL HIGH PROTEIN PO LIQD
1000.0000 mL | ORAL | Status: DC
  Administered 2022-09-20: 1000 mL

## 2022-09-20 MED ORDER — SODIUM CHLORIDE 0.9 % IV BOLUS
500.0000 mL | Freq: Once | INTRAVENOUS | Status: AC
  Administered 2022-09-20: 500 mL via INTRAVENOUS

## 2022-09-20 MED ORDER — SODIUM CHLORIDE 0.9 % IV SOLN
500.0000 mg | INTRAVENOUS | Status: AC
  Administered 2022-09-20 – 2022-09-24 (×5): 500 mg via INTRAVENOUS
  Filled 2022-09-20 (×6): qty 5

## 2022-09-20 MED ORDER — DOCUSATE SODIUM 50 MG/5ML PO LIQD
100.0000 mg | Freq: Two times a day (BID) | ORAL | Status: DC
  Administered 2022-09-20 – 2022-09-25 (×9): 100 mg
  Filled 2022-09-20 (×9): qty 10

## 2022-09-20 MED ORDER — ACETAMINOPHEN 325 MG PO TABS
650.0000 mg | ORAL_TABLET | ORAL | Status: DC | PRN
  Administered 2022-09-23: 650 mg
  Filled 2022-09-20: qty 2

## 2022-09-20 MED ORDER — NOREPINEPHRINE 4 MG/250ML-% IV SOLN
0.0000 ug/min | INTRAVENOUS | Status: DC
  Administered 2022-09-20: 2 ug/min via INTRAVENOUS
  Filled 2022-09-20: qty 250

## 2022-09-20 MED ORDER — IOHEXOL 350 MG/ML SOLN
75.0000 mL | Freq: Once | INTRAVENOUS | Status: AC | PRN
  Administered 2022-09-20: 75 mL via INTRAVENOUS

## 2022-09-20 MED ORDER — SODIUM CHLORIDE 0.9 % IV SOLN
1.0000 g | Freq: Once | INTRAVENOUS | Status: AC
  Administered 2022-09-20: 1 g via INTRAVENOUS
  Filled 2022-09-20: qty 10

## 2022-09-20 MED ORDER — SODIUM CHLORIDE 0.9 % IV SOLN: 250.0000 mL | INTRAVENOUS | Status: DC

## 2022-09-20 MED ORDER — FREE WATER
200.0000 mL | Status: DC
  Administered 2022-09-20 – 2022-09-22 (×11): 200 mL

## 2022-09-20 MED ORDER — INSULIN ASPART 100 UNIT/ML IJ SOLN
0.0000 [IU] | INTRAMUSCULAR | Status: DC
  Administered 2022-09-20: 2 [IU] via SUBCUTANEOUS
  Administered 2022-09-21 (×3): 3 [IU] via SUBCUTANEOUS
  Administered 2022-09-21: 5 [IU] via SUBCUTANEOUS
  Administered 2022-09-21: 3 [IU] via SUBCUTANEOUS
  Administered 2022-09-21: 2 [IU] via SUBCUTANEOUS
  Administered 2022-09-22: 5 [IU] via SUBCUTANEOUS
  Administered 2022-09-22 (×4): 3 [IU] via SUBCUTANEOUS
  Administered 2022-09-22: 5 [IU] via SUBCUTANEOUS
  Administered 2022-09-23 (×3): 3 [IU] via SUBCUTANEOUS
  Administered 2022-09-23: 8 [IU] via SUBCUTANEOUS
  Administered 2022-09-24: 3 [IU] via SUBCUTANEOUS
  Administered 2022-09-24: 5 [IU] via SUBCUTANEOUS
  Administered 2022-09-24 – 2022-09-25 (×6): 3 [IU] via SUBCUTANEOUS
  Administered 2022-09-25 (×2): 5 [IU] via SUBCUTANEOUS

## 2022-09-20 MED ORDER — LACTATED RINGERS IV BOLUS
1000.0000 mL | Freq: Once | INTRAVENOUS | Status: AC
  Administered 2022-09-20: 1000 mL via INTRAVENOUS

## 2022-09-20 MED ORDER — SODIUM CHLORIDE 0.9 % IV SOLN
500.0000 mg | Freq: Once | INTRAVENOUS | Status: AC
  Administered 2022-09-20: 500 mg via INTRAVENOUS
  Filled 2022-09-20: qty 5

## 2022-09-20 MED ORDER — IPRATROPIUM-ALBUTEROL 0.5-2.5 (3) MG/3ML IN SOLN: 3.0000 mL | RESPIRATORY_TRACT | Status: DC | PRN

## 2022-09-20 MED ORDER — LEVOFLOXACIN IN D5W 750 MG/150ML IV SOLN
750.0000 mg | Freq: Once | INTRAVENOUS | Status: DC
  Filled 2022-09-20: qty 150

## 2022-09-20 MED ORDER — AMIODARONE HCL 200 MG PO TABS
200.0000 mg | ORAL_TABLET | Freq: Every day | ORAL | Status: DC
  Administered 2022-09-20 – 2022-09-25 (×6): 200 mg
  Filled 2022-09-20 (×7): qty 1

## 2022-09-20 MED ORDER — FAMOTIDINE 20 MG PO TABS: 20.0000 mg | ORAL_TABLET | Freq: Two times a day (BID) | ORAL | Status: DC

## 2022-09-20 MED ORDER — SODIUM CHLORIDE 0.9 % IV SOLN
2.0000 g | INTRAVENOUS | Status: AC
  Administered 2022-09-20 – 2022-09-24 (×5): 2 g via INTRAVENOUS
  Filled 2022-09-20 (×5): qty 20

## 2022-09-20 MED ORDER — HYDROCORTISONE SOD SUC (PF) 100 MG IJ SOLR
100.0000 mg | Freq: Three times a day (TID) | INTRAMUSCULAR | Status: DC
  Administered 2022-09-20 – 2022-09-22 (×6): 100 mg via INTRAVENOUS
  Filled 2022-09-20 (×6): qty 2

## 2022-09-20 MED ORDER — ORAL CARE MOUTH RINSE: 15.0000 mL | OROMUCOSAL | Status: DC | PRN

## 2022-09-20 MED ORDER — ARFORMOTEROL TARTRATE 15 MCG/2ML IN NEBU
15.0000 ug | INHALATION_SOLUTION | Freq: Two times a day (BID) | RESPIRATORY_TRACT | Status: DC
  Administered 2022-09-20 – 2022-09-25 (×10): 15 ug via RESPIRATORY_TRACT
  Filled 2022-09-20 (×10): qty 2

## 2022-09-20 MED ORDER — BUDESONIDE 0.5 MG/2ML IN SUSP
0.5000 mg | Freq: Two times a day (BID) | RESPIRATORY_TRACT | Status: DC
  Administered 2022-09-20 – 2022-09-25 (×10): 0.5 mg via RESPIRATORY_TRACT
  Filled 2022-09-20 (×11): qty 2

## 2022-09-20 MED ORDER — LACTATED RINGERS IV SOLN: INTRAVENOUS | Status: DC

## 2022-09-20 MED ORDER — POLYETHYLENE GLYCOL 3350 17 G PO PACK
17.0000 g | PACK | Freq: Every day | ORAL | Status: DC
  Administered 2022-09-21 – 2022-09-25 (×3): 17 g
  Filled 2022-09-20 (×4): qty 1

## 2022-09-20 MED ORDER — INSULIN ASPART 100 UNIT/ML IJ SOLN
0.0000 [IU] | INTRAMUSCULAR | Status: DC
  Administered 2022-09-20: 2 [IU] via SUBCUTANEOUS
  Administered 2022-09-20: 5 [IU] via SUBCUTANEOUS

## 2022-09-20 MED ORDER — AMIODARONE HCL 200 MG PO TABS: 200.0000 mg | ORAL_TABLET | Freq: Every day | ORAL | Status: DC

## 2022-09-20 MED ORDER — ROSUVASTATIN CALCIUM 20 MG PO TABS: 40.0000 mg | ORAL_TABLET | Freq: Every day | ORAL | Status: DC

## 2022-09-20 MED ORDER — MAGNESIUM SULFATE IN D5W 1-5 GM/100ML-% IV SOLN
1.0000 g | Freq: Once | INTRAVENOUS | Status: AC
  Administered 2022-09-20: 1 g via INTRAVENOUS
  Filled 2022-09-20: qty 100

## 2022-09-20 MED ORDER — PROSOURCE TF20 ENFIT COMPATIBL EN LIQD
60.0000 mL | Freq: Every day | ENTERAL | Status: DC
  Administered 2022-09-20 – 2022-09-21 (×2): 60 mL
  Filled 2022-09-20 (×2): qty 60
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
## 2022-09-20 NOTE — Progress Notes (Signed)
eLink Physician-Brief Progress Note Patient Name: [name] DOB: [birth year] MRN: [id number]   Date of Service  09/20/2022  HPI/Events of Note  CODE status clarification.  Discussed with Daughter, [name]. Over the phone. She herself critical care RN.  She and family wish him to be:  DNR- No intubation. No CPR. Ok for fluids, pressors, central line, BiPAP, Tube feeding-jevity or equivqlent via G tube, and fluid bolus. As per her EF improved post pacemaker.    eICU Interventions  Same updated on Code status on epic.      Intervention Category Intermediate Interventions: Other:;Communication with other healthcare providers and/or family  Elmer Sow 09/20/2022, 9:59 PM

## 2022-09-20 NOTE — Progress Notes (Signed)
eLink Physician-Brief Progress Note Patient Name: [name] DOB: 1933-08-05 MRN: [id number]   Date of Service  09/20/2022  HPI/Events of Note  desating with HFNV with new concerns of ECG that was just done,     DNR on status but also says can use mechanical assistance.  Camera: VS HR 82, regular, pacing now. 93% sats , in synchrony with Vent. MAP 68 On HFNC.  Discussed with RN. Chart reviewed, DNR issues discussed.  Mag at 1.9 K was 3.8 from AM.  ECG: Wide complex, atrial paced rhythm, qtc prolonged > 540.    eICU Interventions  - stat BMP, replace if has low K, keep > 4 - 1 gm Magnesium ordered - tried to call daughter for clarification on DNR status, intubation, BiPAP-. Not received calls, voice mail box full, left my call back via SMS. -continue care.      Intervention Category Major Interventions: Arrhythmia - evaluation and management;Other:  Elmer Sow 09/20/2022, 9:35 PM

## 2022-09-20 NOTE — ED Provider Notes (Signed)
[facility] Provider Note   CSN: [id number] Arrival date & time: 09/19/22  2350     History  Chief Complaint  Patient presents with   Respiratory Distress   Altered Mental Status    [name] is a 89 y.o. male.  89 year old male who presents with EMS secondary to being unresponsive.  Not much history able to be obtained from him secondary to his mental status and work of breathing.  Per EMS who I spoke with myself the patient has a history of stroke, bedbound and was unresponsive today so on their arrival sats were 90-91 and was having periods of apnea and also periods of agonal breathing.  Patient is DNR/DNI so they assisted ventilations and brought him here.  He is hypotensive and route.  On arrival here he is groaning and will mumble something to questions but is not able to offer any history.   His daughter who is a nurse somewhere arrived and stated that she got back from a conference today and noticed that the patient was lethargic and having trouble breathing she checked his oxygen was [id number] states that he gets this with pneumonia sometimes.  She confirms DNR/DNI status.  She states they would be ok with pressors, fluids and antibiotics however prefer not to have a central line.    Altered Mental Status      Home Medications Prior to Admission medications   Medication Sig Start Date End Date Taking? Authorizing Provider  acetaminophen (TYLENOL) 160 MG/5ML solution Place 31.3 mLs (1,000 mg total) into feeding tube every 6 (six) hours. Patient taking differently: Place 960 mg into feeding tube every 6 (six) hours. 30 mls - 960 mg 01/21/22   Nicole Kindred A, DO  amiodarone (PACERONE) 200 MG tablet Place 1 tablet (200 mg total) into feeding tube daily. Patient taking differently: Place 200 mg into feeding tube in the morning. 07/21/22   Arnoldo Lenis, MD  apixaban (ELIQUIS) 5 MG TABS tablet Place 1 tablet (5 mg total) into  feeding tube 2 (two) times daily. 01/21/22   Ezekiel Slocumb, DO  Apoaequorin (PREVAGEN PO) Give 1 tablet by tube in the morning.    [provider]  azelastine (ASTELIN) 0.1 % nasal spray Place 1 spray into both nostrils 2 (two) times daily. 10/15/21   [name], DO  budesonide-formoterol (SYMBICORT) 160-4.5 MCG/ACT inhaler Inhale 2 puffs into the lungs 2 (two) times daily. 05/26/22   [name], DO  Cholecalciferol (D3 2000 PO) Give 2,000 Units by tube in the morning.    [provider]  denosumab (PROLIA) 60 MG/ML SOSY injection Inject 60 mg into the skin every 6 (six) months.    [provider]  diclofenac Sodium (VOLTAREN) 1 % GEL Apply 2 g topically 4 (four) times daily as needed (pain). Patient not taking: Reported on 08/28/2022 04/25/22   [name], DO  doxazosin (CARDURA) 1 MG tablet PLACE 1 TABLET (1 MG TOTAL) INTO FEEDING TUBE DAILY. Patient taking differently: Place 1 mg into feeding tube at bedtime. 08/15/22   [name], DO  famotidine (PEPCID) 20 MG tablet Place 1 tablet (20 mg total) into feeding tube 2 (two) times daily. 08/15/22 10/14/22  [name], DO  ferrous gluconate (FERGON) 240 (27 FE) MG tablet Place 240 mg into feeding tube every evening.    [provider]  fluconazole (DIFLUCAN) 150 MG tablet Place 1 tablet (150 mg total) into  feeding tube daily. 08/28/22   Charlesetta Shanks, MD  ipratropium-albuterol (DUONEB) 0.5-2.5 (3) MG/3ML SOLN INHALE 3 ML (CONTENTS OF 1 VIAL) BY NEBULIZATION 2 (TWO) TIMES DAILY. Patient taking differently: Take 3 mLs by nebulization 2 (two) times daily. 06/11/22   [name], DO  lidocaine (LIDODERM) 5 % Place 1 patch onto the skin daily. Remove & Discard patch within 12 hours or as directed by MD Patient not taking: Reported on 08/28/2022 04/25/22   [name], DO  losartan (COZAAR) 25 MG tablet TAKE 1/2 TABLET BY MOUTH EVERY DAY Patient taking differently:  Place 12.5 mg into feeding tube in the morning. 08/15/22   Arnoldo Lenis, MD  meclizine (ANTIVERT) 25 MG tablet Take 1 tablet (25 mg total) by mouth 3 (three) times daily as needed for dizziness. Patient not taking: Reported on 08/28/2022 05/13/21   Sherwood Gambler, MD  melatonin 5 MG TABS Place 5 mg into feeding tube at bedtime.    [provider]  metFORMIN (GLUCOPHAGE) 500 MG tablet TAKE 1 TABLET BY MOUTH DAILY  WITH BREAKFAST Patient taking differently: Place 500 mg into feeding tube in the morning. 08/19/22   [name], DO  mirtazapine (REMERON) 15 MG tablet TAKE 1 TABLET BY MOUTH EVERYDAY AT BEDTIME Patient taking differently: Place 15 mg into feeding tube at bedtime. 08/15/22   [name], DO  nitrofurantoin (MACRODANTIN) 50 MG capsule PLACE 1 CAPSULE (50 MG TOTAL) INTO FEEDING TUBE AT BEDTIME. Patient taking differently: Place 50 mg into feeding tube at bedtime. Continuous course. 07/28/22   Irine Seal, MD  nutrition supplement, JUVEN, Fanny Dance) PACK Place 1 packet into feeding tube 2 (two) times daily between meals. 02/19/22   Lavina Hamman, MD  Nutritional Supplements (FEEDING SUPPLEMENT, JEVITY 1.5 CAL/FIBER,) LIQD Place 1,000 mLs into feeding tube daily. At 55 ml/hour Patient taking differently: Place 1,350 mLs into feeding tube See admin instructions. 1350 mL total at 90 ml/hr for 15 hours, daily. 02/19/22   Lavina Hamman, MD  ondansetron (ZOFRAN-ODT) 8 MG disintegrating tablet Take 1 tablet (8 mg total) by mouth every 8 (eight) hours as needed for nausea or vomiting. Patient not taking: Reported on 08/28/2022 05/26/22   [name], DO  pantoprazole (PROTONIX) 20 MG tablet Take 2 tablets (40 mg total) by mouth every morning. Per tube Patient taking differently: 40 mg See admin instructions. 40 mg per tube every morning 05/26/22   Ronnie Doss M, DO  Potassium & Sodium Phosphates (PHOSPHORUS W/SOD & POTASSIUM) 280-160-250 MG PACK PLACE 1 PACKET  INTO FEEDING TUBE 4 (FOUR) TIMES DAILY - WITH MEALS AND AT BEDTIME. 09/12/22   Gottschalk, Leatrice Jewels M, DO  predniSONE (DELTASONE) 10 MG tablet Take 10 mg by mouth in the morning. Continuous course.    [provider]  predniSONE (STERAPRED UNI-PAK 21 TAB) 10 MG (21) TBPK tablet As directed x 6 days Patient not taking: Reported on 08/28/2022 08/01/22   Baruch Gouty, [name]  rosuvastatin (CRESTOR) 40 MG tablet TAKE 1 TABLET BY MOUTH DAILY Patient taking differently: Take 40 mg by mouth in the morning. 07/30/22   [name], DO  Water For Irrigation, Sterile (FREE WATER) SOLN Place 100 mLs into feeding tube every 4 (four) hours. 02/19/22   Lavina Hamman, MD  Zinc Oxide (TRIPLE PASTE) 12.8 % ointment APPLY TO AFFECTED AREA TWICE A DAY 09/01/22   Ronnie Doss M, DO      Allergies    Penicillin v potassium, Penicillins,  Ultram [tramadol], and Ketorolac tromethamine    Review of Systems   Review of Systems  Physical Exam Updated Vital Signs BP (!) 111/56   Pulse 100   Temp 97.6 F (36.4 C) (Oral)   Resp (!) 26   Ht '5\' 11"'$  (1.803 m)   Wt 81.6 kg   SpO2 96%   BMI 25.10 kg/m  Physical Exam Vitals and nursing note reviewed.  Constitutional:      Appearance: He is well-developed.  HENT:     Head: Normocephalic and atraumatic.  Eyes:     Pupils: Pupils are equal, round, and reactive to light.  Cardiovascular:     Rate and Rhythm: Tachycardia present.     Comments: hypotensive Pulmonary:     Effort: Pulmonary effort is normal. No respiratory distress.  Abdominal:     General: Abdomen is flat. There is no distension.  Musculoskeletal:        General: Normal range of motion.     Cervical back: Normal range of motion.  Skin:    General: Skin is warm and dry.  Neurological:     General: No focal deficit present.     Mental Status: He is alert.     ED Results / Procedures / Treatments   Labs (all labs ordered are listed, but only abnormal results are displayed) Labs  Reviewed  LACTIC ACID, PLASMA - Abnormal; Notable for the following components:      Result Value   Lactic Acid, Venous 2.4 (*)    All other components within normal limits  LACTIC ACID, PLASMA - Abnormal; Notable for the following components:   Lactic Acid, Venous 3.5 (*)    All other components within normal limits  COMPREHENSIVE METABOLIC PANEL - Abnormal; Notable for the following components:   Sodium 148 (*)    Glucose, Bld 271 (*)    BUN 82 (*)    Total Protein 5.2 (*)    Albumin 2.4 (*)    All other components within normal limits  CBC WITH DIFFERENTIAL/PLATELET - Abnormal; Notable for the following components:   WBC 18.2 (*)    RBC 3.54 (*)    Hemoglobin 10.8 (*)    HCT 35.6 (*)    MCV 100.6 (*)    RDW 19.8 (*)    Platelets 110 (*)    All other components within normal limits  PROTIME-INR - Abnormal; Notable for the following components:   Prothrombin Time 21.1 (*)    INR 1.8 (*)    All other components within normal limits  URINALYSIS, W/ REFLEX TO CULTURE (INFECTION SUSPECTED) - Abnormal; Notable for the following components:   APPearance HAZY (*)    Specific Gravity, Urine 1.039 (*)    Hgb urine dipstick SMALL (*)    Protein, ur 30 (*)    Leukocytes,Ua SMALL (*)    All other components within normal limits  BRAIN NATRIURETIC PEPTIDE - Abnormal; Notable for the following components:   B Natriuretic Peptide 229.6 (*)    All other components within normal limits  LACTIC ACID, PLASMA - Abnormal; Notable for the following components:   Lactic Acid, Venous 2.3 (*)    All other components within normal limits  HEMOGLOBIN A1C - Abnormal; Notable for the following components:   Hgb A1c MFr Bld 6.8 (*)    All other components within normal limits  RESP PANEL BY RT-PCR (RSV, FLU A&B, COVID)  RVPGX2  CULTURE, BLOOD (ROUTINE X 2)  CULTURE, BLOOD (ROUTINE X 2)  URINE  CULTURE  RESPIRATORY PANEL BY PCR  MRSA NEXT GEN BY PCR, NASAL  APTT  AMMONIA  MAGNESIUM  CORTISOL   STREP PNEUMONIAE URINARY ANTIGEN  PHOSPHORUS  PATHOLOGIST SMEAR REVIEW  LACTIC ACID, PLASMA  PROCALCITONIN  LEGIONELLA PNEUMOPHILA SEROGP 1 UR AG    EKG EKG Interpretation  Date/Time:  Friday September 19 2022 23:55:07 EST Ventricular Rate:  102 PR Interval:  167 QRS Duration: 197 QT Interval:  431 QTC Calculation: 562 R Axis:   -58 Text Interpretation: Sinus or ectopic atrial tachycardia LVH with IVCD, LAD and secondary repol abnrm Prolonged QT interval Baseline wander in lead(s) V6 Confirmed by Merrily Pew [phone]) on 09/20/2022 12:11:09 AM  Radiology CT Angio Chest PE W and/or Wo Contrast  Result Date: 09/20/2022 CLINICAL DATA:  Respiratory distress and unresponsiveness. Pulmonary embolism suspected. EXAM: CT ANGIOGRAPHY CHEST CT ABDOMEN AND PELVIS WITH CONTRAST TECHNIQUE: Multidetector CT imaging of the chest was performed using the standard protocol during bolus administration of intravenous contrast. Multiplanar CT image reconstructions and MIPs were obtained to evaluate the vascular anatomy. Multidetector CT imaging of the abdomen and pelvis was performed using the standard protocol during bolus administration of intravenous contrast. RADIATION DOSE REDUCTION: This exam was performed according to the departmental dose-optimization program which includes automated exposure control, adjustment of the mA and/or kV according to patient size and/or use of iterative reconstruction technique. CONTRAST:  25m OMNIPAQUE IOHEXOL 350 MG/ML SOLN COMPARISON:  CTA chest 03/24/2022 and CT abdomen and pelvis 03/24/2022 FINDINGS: CTA CHEST FINDINGS Cardiovascular: Satisfactory opacification of the pulmonary arteries to the segmental level. No evidence of pulmonary embolism. Cardiomegaly. No pericardial effusion. Coronary artery and aortic atherosclerotic calcification. Left chest wall pacemaker. Sternotomy and CABG. Mediastinum/Nodes: No thoracic adenopathy by size. Unremarkable thyroid and esophagus.  Lungs/Pleura: Bronchial wall thickening greatest in the lower lobes with associated mucous plugging. Centrilobular micro nodularity and tree-in-bud opacities greatest in the lower lobes. No pleural effusion or pneumothorax. Musculoskeletal: Thoracolumbar spondylosis.  No acute fracture. Review of the MIP images confirms the above findings. CT ABDOMEN and PELVIS FINDINGS Hepatobiliary: No focal liver abnormality is seen. Status post cholecystectomy. No biliary dilatation. Pancreas: Unremarkable. No pancreatic ductal dilatation or surrounding inflammatory changes. Spleen: Unchanged borderline splenomegaly. Unchanged hemangioma in the splenic dome. Adrenals/Urinary Tract: Normal adrenal glands. Bilateral cortical renal scarring. Low-attenuation lesions in the kidneys are statistically likely to represent cysts. No follow-up is required. Nonobstructing nephrolithiasis versus parenchymal calcification bilaterally. No hydronephrosis. Unremarkable bladder. Stomach/Bowel: Gastrostomy tube with balloon in the stomach and tip in the jejunum. Normal caliber large and small bowel. Large colonic stool burden. Question mild rectal wall thickening. Normal appendix. Vascular/Lymphatic: Advanced aortic calcification. Aorto bi-iliac bypass graft is patent. 2.1 cm left common iliac artery aneurysm, unchanged. Reproductive: Unremarkable. Other: Trace free fluid in the pelvis. No free intraperitoneal air. Bilateral fat containing inguinal hernias. Musculoskeletal: Healing fractures of the right sacral ala and right superior and inferior pubic rami. No acute fracture. Review of the MIP images confirms the above findings. IMPRESSION: 1. Negative for acute pulmonary embolism. 2. Interval worsening of lower lobe predominant bronchopneumonia in a distribution suspicious for aspiration. 3. Large colonic stool burden.  Correlate for constipation. 4. Possible mild proctitis. 5.  Aortic Atherosclerosis (ICD10-I70.0). Electronically Signed   By:  TPlacido SouM.D.   On: 09/20/2022 03:29   CT ABDOMEN PELVIS W CONTRAST  Result Date: 09/20/2022 CLINICAL DATA:  Respiratory distress and unresponsiveness. Pulmonary embolism suspected. EXAM: CT ANGIOGRAPHY CHEST CT ABDOMEN AND PELVIS WITH CONTRAST TECHNIQUE: Multidetector  CT imaging of the chest was performed using the standard protocol during bolus administration of intravenous contrast. Multiplanar CT image reconstructions and MIPs were obtained to evaluate the vascular anatomy. Multidetector CT imaging of the abdomen and pelvis was performed using the standard protocol during bolus administration of intravenous contrast. RADIATION DOSE REDUCTION: This exam was performed according to the departmental dose-optimization program which includes automated exposure control, adjustment of the mA and/or kV according to patient size and/or use of iterative reconstruction technique. CONTRAST:  58m OMNIPAQUE IOHEXOL 350 MG/ML SOLN COMPARISON:  CTA chest 03/24/2022 and CT abdomen and pelvis 03/24/2022 FINDINGS: CTA CHEST FINDINGS Cardiovascular: Satisfactory opacification of the pulmonary arteries to the segmental level. No evidence of pulmonary embolism. Cardiomegaly. No pericardial effusion. Coronary artery and aortic atherosclerotic calcification. Left chest wall pacemaker. Sternotomy and CABG. Mediastinum/Nodes: No thoracic adenopathy by size. Unremarkable thyroid and esophagus. Lungs/Pleura: Bronchial wall thickening greatest in the lower lobes with associated mucous plugging. Centrilobular micro nodularity and tree-in-bud opacities greatest in the lower lobes. No pleural effusion or pneumothorax. Musculoskeletal: Thoracolumbar spondylosis.  No acute fracture. Review of the MIP images confirms the above findings. CT ABDOMEN and PELVIS FINDINGS Hepatobiliary: No focal liver abnormality is seen. Status post cholecystectomy. No biliary dilatation. Pancreas: Unremarkable. No pancreatic ductal dilatation or  surrounding inflammatory changes. Spleen: Unchanged borderline splenomegaly. Unchanged hemangioma in the splenic dome. Adrenals/Urinary Tract: Normal adrenal glands. Bilateral cortical renal scarring. Low-attenuation lesions in the kidneys are statistically likely to represent cysts. No follow-up is required. Nonobstructing nephrolithiasis versus parenchymal calcification bilaterally. No hydronephrosis. Unremarkable bladder. Stomach/Bowel: Gastrostomy tube with balloon in the stomach and tip in the jejunum. Normal caliber large and small bowel. Large colonic stool burden. Question mild rectal wall thickening. Normal appendix. Vascular/Lymphatic: Advanced aortic calcification. Aorto bi-iliac bypass graft is patent. 2.1 cm left common iliac artery aneurysm, unchanged. Reproductive: Unremarkable. Other: Trace free fluid in the pelvis. No free intraperitoneal air. Bilateral fat containing inguinal hernias. Musculoskeletal: Healing fractures of the right sacral ala and right superior and inferior pubic rami. No acute fracture. Review of the MIP images confirms the above findings. IMPRESSION: 1. Negative for acute pulmonary embolism. 2. Interval worsening of lower lobe predominant bronchopneumonia in a distribution suspicious for aspiration. 3. Large colonic stool burden.  Correlate for constipation. 4. Possible mild proctitis. 5.  Aortic Atherosclerosis (ICD10-I70.0). Electronically Signed   By: TPlacido SouM.D.   On: 09/20/2022 03:29   DG Chest Port 1 View  Result Date: 09/20/2022 CLINICAL DATA:  Questionable sepsis-evaluate for abnormality. Respiratory distress and unresponsiveness. EXAM: PORTABLE CHEST 1 VIEW COMPARISON:  08/01/2022 FINDINGS: Stable cardiomegaly. Aortic atherosclerotic calcification. Left chest wall pacemaker. Sternotomy and CABG. Telemetry leads overlie the chest. Low lung volumes. Similar coarsening of the interstitium. Bibasilar atelectasis. No definite pleural effusion. No pneumothorax.  Advanced arthritis left shoulder with superior subluxation of the humeral head in relation to the glenoid, unchanged. IMPRESSION: Cardiomegaly.  Possible mild interstitial edema. Electronically Signed   By: TPlacido SouM.D.   On: 09/20/2022 00:34    Procedures .Critical Care  Performed by: MMerrily Pew MD Authorized by: MMerrily Pew MD   Critical care provider statement:    Critical care time (minutes):  30   Critical care was necessary to treat or prevent imminent or life-threatening deterioration of the following conditions:  Shock and sepsis   Critical care was time spent personally by me on the following activities:  Development of treatment plan with patient or surrogate, discussions with consultants, evaluation of patient's  response to treatment, examination of patient, ordering and review of laboratory studies, ordering and review of radiographic studies, ordering and performing treatments and interventions, pulse oximetry, re-evaluation of patient's condition and review of old charts    Medications Ordered in ED Medications  lactated ringers infusion ( Intravenous New Bag/Given 09/20/22 0017)  0.9 %  sodium chloride infusion (250 mLs Intravenous Not Given 09/20/22 0518)  norepinephrine (LEVOPHED) '4mg'$  in 241m (0.016 mg/mL) premix infusion (8 mcg/min Intravenous New Bag/Given 09/20/22 0522)  cefTRIAXone (ROCEPHIN) 2 g in sodium chloride 0.9 % 100 mL IVPB (has no administration in time range)  azithromycin (ZITHROMAX) 500 mg in sodium chloride 0.9 % 250 mL IVPB (has no administration in time range)  docusate (COLACE) 50 MG/5ML liquid 100 mg (has no administration in time range)  polyethylene glycol (MIRALAX / GLYCOLAX) packet 17 g (has no administration in time range)  acetaminophen (TYLENOL) tablet 650 mg (has no administration in time range)  famotidine (PEPCID) tablet 20 mg (has no administration in time range)  ipratropium-albuterol (DUONEB) 0.5-2.5 (3) MG/3ML nebulizer  solution 3 mL (has no administration in time range)  insulin aspart (novoLOG) injection 0-9 Units (has no administration in time range)  budesonide (PULMICORT) nebulizer solution 0.5 mg (has no administration in time range)  arformoterol (BROVANA) nebulizer solution 15 mcg (has no administration in time range)  amiodarone (PACERONE) tablet 200 mg (has no administration in time range)  rosuvastatin (CRESTOR) tablet 40 mg (has no administration in time range)  cefTRIAXone (ROCEPHIN) 1 g in sodium chloride 0.9 % 100 mL IVPB (0 g Intravenous Stopped 09/20/22 0134)  azithromycin (ZITHROMAX) 500 mg in sodium chloride 0.9 % 250 mL IVPB (0 mg Intravenous Stopped 09/20/22 0202)  lactated ringers bolus 1,000 mL (0 mLs Intravenous Stopped 09/20/22 0316)  lactated ringers bolus 1,000 mL (0 mLs Intravenous Stopped 09/20/22 0424)  iohexol (OMNIPAQUE) 350 MG/ML injection 75 mL (75 mLs Intravenous Contrast Given 09/20/22 0310)  lactated ringers bolus 1,000 mL (0 mLs Intravenous Stopped 09/20/22 0517)    ED Course/ Medical Decision Making/ A&P                             Medical Decision Making Amount and/or Complexity of Data Reviewed Labs: ordered. Radiology: ordered. ECG/medicine tests: ordered.  Risk Prescription drug management. Decision regarding hospitalization.   Septic shock.  CT scan ultimately showed likely pneumonia as a cause.  Requiring 10 of Levophed so no central line placed at this time.  Antibiotics started for commune acquired pneumonia.  Multiple fluid boluses given lactic acid still raising.  Discussed with critical care who will admit.  Mental status is improving.  Color is improving.  Daughter up-to-date.  Final Clinical Impression(s) / ED Diagnoses Final diagnoses:  Altered mental status, unspecified altered mental status type  Septic shock ([facility]    Rx / DC Orders ED Discharge Orders     None         Townes Fuhs, JCorene Cornea MD 09/20/22 [phone]
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
## 2022-09-20 NOTE — Sepsis Progress Note (Signed)
Elink monitoring for the code sepsis protocol.  

## 2022-09-21 DIAGNOSIS — A419 Sepsis, unspecified organism: Secondary | ICD-10-CM | POA: Diagnosis not present

## 2022-09-21 DIAGNOSIS — R6521 Severe sepsis with septic shock: Secondary | ICD-10-CM | POA: Diagnosis not present

## 2022-09-21 LAB — MAGNESIUM
Magnesium: 2.2 mg/dL (ref 1.7–2.4)
Magnesium: 2.1 mg/dL (ref 1.7–2.4)

## 2022-09-21 LAB — BASIC METABOLIC PANEL
Anion gap: 8 (ref 5–15)
Anion gap: 9 (ref 5–15)
BUN: 44 mg/dL — ABNORMAL HIGH (ref 8–23)
BUN: 47 mg/dL — ABNORMAL HIGH (ref 8–23)
CO2: 24 mmol/L (ref 22–32)
CO2: 25 mmol/L (ref 22–32)
Calcium: 9.6 mg/dL (ref 8.9–10.3)
Calcium: 9.9 mg/dL (ref 8.9–10.3)
Chloride: 111 mmol/L (ref 98–111)
Chloride: 113 mmol/L — ABNORMAL HIGH (ref 98–111)
Creatinine, Ser: 0.68 mg/dL (ref 0.61–1.24)
Creatinine, Ser: 0.73 mg/dL (ref 0.61–1.24)
GFR, Estimated: >60 mL/min (ref >60)
GFR, Estimated: >60 mL/min (ref >60)
Glucose, Bld: 176 mg/dL — ABNORMAL HIGH (ref 70–99)
Glucose, Bld: 183 mg/dL — ABNORMAL HIGH (ref 70–99)
Potassium: 4.2 mmol/L (ref 3.5–5.1)
Potassium: 4.4 mmol/L (ref 3.5–5.1)
Sodium: 144 mmol/L (ref 135–145)
Sodium: 146 mmol/L — ABNORMAL HIGH (ref 135–145)

## 2022-09-21 LAB — CBC
HCT: 30.7 % — ABNORMAL LOW (ref 39.0–52.0)
Hemoglobin: 9.5 g/dL — ABNORMAL LOW (ref 13.0–17.0)
MCH: 30.9 pg (ref 26.0–34.0)
MCHC: 30.9 g/dL (ref 30.0–36.0)
MCV: 100 fL (ref 80.0–100.0)
Platelets: 103 10*3/uL — ABNORMAL LOW (ref 150–400)
RBC: 3.07 MIL/uL — ABNORMAL LOW (ref 4.22–5.81)
RDW: 19.3 % — ABNORMAL HIGH (ref 11.5–15.5)
WBC: 18.3 10*3/uL — ABNORMAL HIGH (ref 4.0–10.5)
nRBC: 0 % (ref 0.0–0.2)

## 2022-09-21 LAB — URINE CULTURE

## 2022-09-21 LAB — GLUCOSE, CAPILLARY
Glucose-Capillary: 149 mg/dL — ABNORMAL HIGH (ref 70–99)
Glucose-Capillary: 160 mg/dL — ABNORMAL HIGH (ref 70–99)
Glucose-Capillary: 176 mg/dL — ABNORMAL HIGH (ref 70–99)
Glucose-Capillary: 194 mg/dL — ABNORMAL HIGH (ref 70–99)
Glucose-Capillary: 195 mg/dL — ABNORMAL HIGH (ref 70–99)
Glucose-Capillary: 204 mg/dL — ABNORMAL HIGH (ref 70–99)

## 2022-09-21 LAB — PHOSPHORUS
Phosphorus: 2.8 mg/dL (ref 2.5–4.6)
Phosphorus: 1.9 mg/dL — ABNORMAL LOW (ref 2.5–4.6)

## 2022-09-21 MED ORDER — ALBUMIN HUMAN 25 % IV SOLN
25.0000 g | Freq: Once | INTRAVENOUS | Status: AC
  Administered 2022-09-21: 25 g via INTRAVENOUS
  Filled 2022-09-21: qty 100

## 2022-09-21 MED ORDER — VITAL AF 1.2 CAL PO LIQD
1000.0000 mL | ORAL | Status: DC
  Administered 2022-09-21 – 2022-09-24 (×4): 1000 mL via JEJUNOSTOMY
  Filled 2022-09-21 (×4): qty 1000

## 2022-09-21 MED ORDER — LACTATED RINGERS IV BOLUS
1000.0000 mL | Freq: Once | INTRAVENOUS | Status: AC
  Administered 2022-09-21: 1000 mL via INTRAVENOUS

## 2022-09-21 MED ORDER — MIRTAZAPINE 15 MG PO TABS: 15.0000 mg | ORAL_TABLET | Freq: Every day | ORAL | Status: DC

## 2022-09-21 MED ORDER — MELATONIN 5 MG PO TABS
5.0000 mg | ORAL_TABLET | Freq: Every day | ORAL | Status: DC
  Administered 2022-09-21 – 2022-09-24 (×4): 5 mg
  Filled 2022-09-21 (×4): qty 1

## 2022-09-21 MED ORDER — SODIUM PHOSPHATES 45 MMOLE/15ML IV SOLN
30.0000 mmol | Freq: Once | INTRAVENOUS | Status: AC
  Administered 2022-09-21: 30 mmol via INTRAVENOUS
  Filled 2022-09-21: qty 10

## 2022-09-21 NOTE — Progress Notes (Signed)
Initial Nutrition Assessment RD working remotely.   DOCUMENTATION CODES:   Not applicable  INTERVENTION:  - changed TF to Vital 1.2 @ 40 ml/hr to advance by 10 ml every 12 hours to reach goal rate of 70 ml/hr.  - at goal rate, this regimen will provide 2016 kcal, 126 grams protein, and 1362 ml water.  - d/c'ed Prosource TF20.  - free water flush to continue to be per CCM.  - complete NFPE when feasible.   NUTRITION DIAGNOSIS:   Increased nutrient needs related to acute illness, chronic illness as evidenced by estimated needs.  GOAL:   Patient will meet greater than or equal to 90% of their needs  MONITOR:   TF tolerance, Labs, Weight trends, Other (Comment)  REASON FOR ASSESSMENT:   Malnutrition Screening Tool, Consult Enteral/tube feeding initiation and management (trickle feeds only; RD to provide TF recommendations)  ASSESSMENT:   89 year old male with medical history of COPD, OSA not on CPAP, PAF, systolic CHF, conduction disease/bradycardia s/p pacemaker placement, HTN, vertigo, CAD s.p CABG, GERD, asthma, OSA, type 2 DM, arthritis, prostate cancer, afib, squamous cell carcinoma of the skin, CVA s/p G-tube placement in 12/2021 and G-J conversion in 07/2022. Patient is noted to be [facility]. He presented to the ED on 1/27 due to AMS and hypotension. He lives with his sister. CT chest, abdomen/pelvis showed worsening lower lobe bronchopneumonia suspicious for aspiration, large stool burden likely constipation.  Cultures obtained and started on Rocephin/azithromycin.  Was given IV fluids and remained hypotensive requiring levo. PCCM consulted for ICU admission.  Patient is noted to be on HFNC. As of 2000 on 09/20/22 he was noted to be incomprehensible. Patient with G-J tube in place and order placed yesterday evening for Vital High Protein @ 40 ml/hr with 60 ml Prosource TF20 once/day and 200 ml water every 4 hours. This regimen provides 1040 kcal, 104 grams protein, and 2002 ml  water. Confirmed with RN via secure chat that tube feeding is running via J-tube.  Weight today is 172 lb. PTA the most recently documented weights were 179 lb on 04/16/22, 172 lb on 04/06/22, and 177 lb on 02/19/22. No information documented in the edema section of flow sheet.   Patient has not been assessed by a [place] RD since 04/01/22 at which time he met criteria for severe malnutrition related to chronic illness as evidenced by severe muscle and fat depletions. At that time, goal TF regimen was Jevity 1.5 @ 90 ml/hr x15 hours/day with 1 packet Juven BID and 60 ml Prosource TF20 once/day.  Per notes: - septic shock - multifocal PNA suspicious for aspiration - acute encephalopathy thought to be 2/2 sepsis; head CT clear - acute hypoxemic respiratory failure - hypernatremia on admission thought to be 2/2 dehydration and hx of stage 3 CKD   Labs reviewed; CBGs: 149 and 194 mg/dl, BUN: 44 mg/dl.  Medications reviewed; 100 mg colace BID, 20 mg pepcid/day, 100 mg solu-cortef TID, sliding scale novolog, 1 g IV Mg sulfate/day, 17 g miralax/day.     NUTRITION - FOCUSED PHYSICAL EXAM:  RD working remotely.  Diet Order:   Diet Order             Diet NPO time specified  Diet effective now                   EDUCATION NEEDS:   No education needs have been identified at this time  Skin:  Skin Assessment: Skin Integrity Issues: Skin Integrity  Issues:: DTI DTI: lower vertebral column  Last BM:  1/27 (type 5 x1, small amount)  Height:   Ht Readings from Last 1 Encounters:  09/19/22 '5\' 11"'$  (1.803 m)    Weight:   Wt Readings from Last 1 Encounters:  09/21/22 78 kg     BMI:  Body mass index is 23.98 kg/m.  Estimated Nutritional Needs:  Kcal:  2000-2200 kcal Protein:  100-110 grams Fluid:  >/= 2.2 L/day     Jarome Matin, MS, RD, LDN, CNSC Clinical Dietitian PRN/Relief staff On-call/weekend pager # available in [facility]

## 2022-09-21 NOTE — Progress Notes (Signed)
[place] Progress Note Patient Name: [name] DOB: Jun 05, 1933 MRN: [id number]   Date of Service  09/21/2022  HPI/Events of Note  Phos 1.9 K>4  eICU Interventions  Replete with Na phos     Intervention Category Minor Interventions: Electrolytes abnormality - evaluation and management  Kayshaun Polanco Rodman Pickle 09/21/2022, 8:44 PM
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
## 2022-09-21 NOTE — Progress Notes (Signed)
eLink Physician-Brief Progress Note Patient Name: [name] DOB: [birth year] MRN: [id number]   Date of Service  09/21/2022  HPI/Events of Note  Patient with reported sundowning  eICU Interventions  Restart home melatonin 5 mg nightly Defer to day team to restart home remeron     Intervention Category Minor Interventions: Agitation / anxiety - evaluation and management  Cady Hafen Rodman Pickle 09/21/2022, 9:25 PM

## 2022-09-21 NOTE — Evaluation (Signed)
Physical Therapy Evaluation Patient Details Name: [name] MRN: [id number] DOB: 1933/02/20 Today's Date: 09/21/2022  History of Present Illness  Patient is a 89 year old male who presented to [facility] ED on 1/27 with AMS/hypotension. Pt found to be in septic shock from multifocal pneumonia with acute encephalopathy PMH: COPD, OSA not on CPAP, PAF, systolic HF, conduction disease/bradycardia s/p pacemaker placement, h/o CVA with residual L sided deficits.   Clinical Impression  Pt admitted with above. Pt at this time is totalA for ADLs and mobility. Pt with garbled speech, follows commands about 33% of time, oriented to self, and has noted impaired motor planning and processing. Per chart, as of last August pt predominantly bed bound. Unsure of what the family has available to themselves for equip to aide in caring for patient as he was living with his sister. If family is unable to provided 24/7 assist at [place] pt will need SNF upon d/c. Acute PT to cont to follow.       Recommendations for follow up therapy are one component of a multi-disciplinary discharge planning process, led by the attending physician.  Recommendations may be updated based on patient status, additional functional criteria and insurance authorization.  Follow Up Recommendations Skilled nursing-short term rehab (<3 hours/day) Can patient physically be transported by private vehicle: No    Assistance Recommended at Discharge Frequent or constant Supervision/Assistance  Patient can return home with the following  Two people to help with walking and/or transfers;Two people to help with bathing/dressing/bathroom;Direct supervision/assist for medications management;Direct supervision/assist for financial management;Assist for transportation;Help with stairs or ramp for entrance;Assistance with feeding;Assistance with cooking/housework    Equipment Recommendations None recommended by PT (TBD at next venue)  Recommendations  for Other Services       Functional Status Assessment Patient has had a recent decline in their functional status and demonstrates the ability to make significant improvements in function in a reasonable and predictable amount of time.     Precautions / Restrictions Precautions Precautions: Fall Precaution Comments: watch SpO2, fragile skin Restrictions Weight Bearing Restrictions: No      Mobility  Bed Mobility Overal bed mobility: Needs Assistance Bed Mobility: Supine to Sit     Supine to sit: Total assist, HOB elevated     General bed mobility comments: HOB all the way elevated, PT utilized  helicopter method to transfer pt to EOB, pt with not assist, totalA for LE management off EOB and for Trunk elevation and to scoot hips to EOB, totalAX2 to scoot up to [place] transfer comment: unable to attempt as PT was alone, will need 2 person assist to attempt standing    Ambulation/Gait               General Gait Details: pt bed bound PTA  Stairs            Wheelchair Mobility    Modified Rankin (Stroke Patients Only)       Balance Overall balance assessment: Needs assistance Sitting-balance support: Feet unsupported, Bilateral upper extremity supported Sitting balance-Leahy Scale: Zero Sitting balance - Comments: dependent on assist of PT to maintain EOB balance                                     Pertinent Vitals/Pain Pain Assessment Pain  Assessment: Faces Faces Pain Scale: Hurts whole lot Pain Location: R UE with movement and L LE with movement Pain Descriptors / Indicators: Discomfort, Grimacing, Moaning Pain Intervention(s): Limited activity within patient's tolerance    Home Living Family/patient expects to be discharged to:: Unsure                   Additional Comments: pt lives with sister, unable to understand patient and no family available to provide home set up. Per  chart pt lives with sister and is pleasantly confused at baseline    Prior Function Prior Level of Function : Needs assist             Mobility Comments: per notes from august admission pt  was predominately bed bound and family transferring him in/out of w/c daily ADLs Comments: near total A per last admission notes     Hand Dominance   Dominant Hand: Right    Extremity/Trunk Assessment   Upper Extremity Assessment Upper Extremity Assessment: Generalized weakness (bilat LEs very bruised with minimal active movement, grimacing/painw ith R UE ROM)    Lower Extremity Assessment Lower Extremity Assessment: Generalized weakness (bilat LEs grossly 2/5, minimal active movement)    Cervical / Trunk Assessment Cervical / Trunk Assessment: Kyphotic  Communication   Communication: HOH;Expressive difficulties (dysarthria, garbled speech, unable to understand)  Cognition Arousal/Alertness: Awake/alert Behavior During Therapy: Flat affect Overall Cognitive Status: History of cognitive impairments - at baseline                                 General Comments: no family present, pt attempting to engage with therapist however unable to understand pt, pt with simple command follow about 33% of time, only oriented to self        General Comments General comments (skin integrity, edema, etc.): pt with bruising t/o bilat UEs, seeping from R forearm, VSS    Exercises     Assessment/Plan    PT Assessment Patient needs continued PT services  PT Problem List Decreased strength;Decreased range of motion;Decreased activity tolerance;Decreased balance;Decreased mobility;Decreased coordination;Decreased cognition;Decreased knowledge of use of DME;Decreased safety awareness       PT Treatment Interventions DME instruction;Gait training;Stair training;Functional mobility training;Therapeutic activities;Balance training;Therapeutic exercise;Neuromuscular re-education    PT  Goals (Current goals can be found in the Care Plan section)  Acute Rehab PT Goals Patient Stated Goal: didn't state PT Goal Formulation: Patient unable to participate in goal setting Time For Goal Achievement: 10/05/22 Potential to Achieve Goals: Fair    Frequency Min 2X/week     Co-evaluation               AM-PAC PT "6 Clicks" Mobility  Outcome Measure Help needed turning from your back to your side while in a flat bed without using bedrails?: Total Help needed moving from lying on your back to sitting on the side of a flat bed without using bedrails?: Total Help needed moving to and from a bed to a chair (including a wheelchair)?: Total Help needed standing up from a chair using your arms (e.g., wheelchair or bedside chair)?: Total Help needed to walk in hospital room?: Total Help needed climbing 3-5 steps with a railing? : Total 6 Click Score: 6    End of Session Equipment Utilized During Treatment: Oxygen Activity Tolerance: Patient limited by fatigue Patient left: in bed;with call bell/phone within reach;with bed alarm set;with nursing/sitter in room Nurse  Communication: Mobility status (RN game to assist PT with scooting pt up in bed, alerted RN of pt bleeding from R forearm) PT Visit Diagnosis: Muscle weakness (generalized) (M62.81);Unsteadiness on feet (R26.81)    Time: 1252-1310 PT Time Calculation (min) (ACUTE ONLY): 18 min   Charges:   PT Evaluation $PT Eval Moderate Complexity: 1 Mod          Kittie Plater, PT, DPT Acute Rehabilitation Services Secure chat preferred Office #: [phone]   Berline Lopes 09/21/2022, 1:30 PM

## 2022-09-22 DIAGNOSIS — J189 Pneumonia, unspecified organism: Secondary | ICD-10-CM | POA: Diagnosis not present

## 2022-09-22 DIAGNOSIS — R6521 Severe sepsis with septic shock: Secondary | ICD-10-CM | POA: Diagnosis not present

## 2022-09-22 DIAGNOSIS — A419 Sepsis, unspecified organism: Secondary | ICD-10-CM | POA: Diagnosis not present

## 2022-09-22 DIAGNOSIS — R4182 Altered mental status, unspecified: Secondary | ICD-10-CM | POA: Diagnosis not present

## 2022-09-22 LAB — BASIC METABOLIC PANEL
Anion gap: 9 (ref 5–15)
BUN: 45 mg/dL — ABNORMAL HIGH (ref 8–23)
CO2: 25 mmol/L (ref 22–32)
Calcium: 9.8 mg/dL (ref 8.9–10.3)
Chloride: 108 mmol/L (ref 98–111)
Creatinine, Ser: 0.61 mg/dL (ref 0.61–1.24)
GFR, Estimated: >60 mL/min (ref >60)
Glucose, Bld: 240 mg/dL — ABNORMAL HIGH (ref 70–99)
Potassium: 3 mmol/L — ABNORMAL LOW (ref 3.5–5.1)
Sodium: 142 mmol/L (ref 135–145)

## 2022-09-22 LAB — GLUCOSE, CAPILLARY
Glucose-Capillary: 164 mg/dL — ABNORMAL HIGH (ref 70–99)
Glucose-Capillary: 169 mg/dL — ABNORMAL HIGH (ref 70–99)
Glucose-Capillary: 180 mg/dL — ABNORMAL HIGH (ref 70–99)
Glucose-Capillary: 185 mg/dL — ABNORMAL HIGH (ref 70–99)
Glucose-Capillary: 190 mg/dL — ABNORMAL HIGH (ref 70–99)
Glucose-Capillary: 211 mg/dL — ABNORMAL HIGH (ref 70–99)
Glucose-Capillary: 217 mg/dL — ABNORMAL HIGH (ref 70–99)

## 2022-09-22 LAB — PHOSPHORUS: Phosphorus: 3.3 mg/dL (ref 2.5–4.6)

## 2022-09-22 LAB — MAGNESIUM: Magnesium: 2.1 mg/dL (ref 1.7–2.4)

## 2022-09-22 MED ORDER — HYDROCORTISONE SOD SUC (PF) 100 MG IJ SOLR
100.0000 mg | Freq: Every day | INTRAMUSCULAR | Status: DC
  Administered 2022-09-23: 100 mg via INTRAVENOUS
  Filled 2022-09-22: qty 2

## 2022-09-22 MED ORDER — MIRTAZAPINE 15 MG PO TABS
15.0000 mg | ORAL_TABLET | Freq: Every day | ORAL | Status: DC
  Administered 2022-09-22 – 2022-09-24 (×3): 15 mg
  Filled 2022-09-22 (×4): qty 1

## 2022-09-22 MED ORDER — POTASSIUM CHLORIDE 10 MEQ/100ML IV SOLN
10.0000 meq | INTRAVENOUS | Status: AC
  Administered 2022-09-22 (×4): 10 meq via INTRAVENOUS
  Filled 2022-09-22 (×4): qty 100

## 2022-09-22 MED ORDER — APIXABAN 5 MG PO TABS
5.0000 mg | ORAL_TABLET | Freq: Two times a day (BID) | ORAL | Status: DC
  Filled 2022-09-22: qty 1

## 2022-09-22 MED ORDER — FREE WATER
100.0000 mL | Status: DC
  Administered 2022-09-22 – 2022-09-25 (×18): 100 mL

## 2022-09-22 MED ORDER — APIXABAN 5 MG PO TABS
5.0000 mg | ORAL_TABLET | Freq: Two times a day (BID) | ORAL | Status: DC
  Administered 2022-09-22 – 2022-09-25 (×7): 5 mg
  Filled 2022-09-22 (×6): qty 1

## 2022-09-22 MED ORDER — POTASSIUM CHLORIDE 20 MEQ PO PACK
20.0000 meq | PACK | ORAL | Status: AC
  Administered 2022-09-22 (×2): 20 meq
  Filled 2022-09-22 (×2): qty 1

## 2022-09-22 NOTE — TOC Progression Note (Signed)
Transition of Care [facility]) - Initial/Assessment Note    Patient Details  Name: [name] MRN: [id number] Date of Birth: [birth year]  Transition of [facility]) CM/SW Contact:    Milinda Antis, LCSWA Phone Number: 09/22/2022, 4:24 PM  Clinical Narrative:                 [name] called the patient's daughter, [name], due to the patient only being oriented to person and there was no answer.  [name] then called the patient's daughter Karl Ito.  [name] informed Ms. Benjamin Stain of PT's recommendation for SNF.  [name] was informed that the family would discuss the recommendation tonight and inform [name] of their decision.  TOC following.          Patient Goals and CMS Choice            Expected Discharge Plan and Services                                              Prior Living Arrangements/Services                       Activities of Daily Living      Permission Sought/Granted                  Emotional Assessment              Admission diagnosis:  Septic shock ([place]) [A41.9, R65.21] Altered mental status, unspecified altered mental status type [R41.82] Patient Active Problem List   Diagnosis Date Noted   Septic shock ([place]) 09/20/2022   Altered mental status 09/20/2022   Multifocal pneumonia 09/20/2022   Pain in joint of left shoulder 07/01/2022   Adult failure to thrive 04/16/2022   Acute cystitis 03/25/2022   Acute respiratory failure with hypoxia (HCC)    Aspiration pneumonia of both lungs due to gastric secretions (HCC)    Hematuria 02/04/2022   Hypernatremia 01/28/2022   Goals of care, counseling/discussion 01/27/2022   Delirium 01/27/2022   Pressure ulcer 01/25/2022   Symptomatic anemia 01/25/2022   Cellulitis 01/24/2022   Dysphagia 01/24/2022   Status post insertion of percutaneous endoscopic gastrostomy (PEG) tube ([place]) 01/24/2022   Urinary retention 01/24/2022   Hypotension 01/24/2022   Diarrhea 01/24/2022   Abnormal LFTs  01/24/2022   Protein-calorie malnutrition, severe 01/07/2022   Acute encephalopathy 01/06/2022   Acute kidney injury superimposed on chronic kidney disease ([place]) 01/06/2022   C6 cervical fracture ([place]) 01/06/2022   History of pelvic hematoma 01/06/2022   Dysarthria 01/06/2022   Closed fracture of multiple pubic rami, right, sequela 69/62/9528   Complicated UTI (urinary tract infection) 06/23/2021   HFrEF (heart failure with reduced ejection fraction) ([place]) 06/23/2021   Acute kidney injury ([place]) 06/23/2021   COVID-19 virus RNA test result positive at limit of detection 02/01/2021   Kidney stones 07/04/2020   Hyperlipidemia associated with type 2 diabetes mellitus ([place]) 07/05/2019   Paroxysmal atrial fibrillation ([place]) 06/29/2019   Acquired thrombophilia ([place]) 06/29/2019   Second degree Mobitz II AV block 02/22/2019   B12 deficiency 12/18/2017   Status post right knee replacement 11/10/2016   S/P repair of abdominal aortic aneurysm using bifurcation graft 10/03/2016   Mixed incontinence 04/03/2015   PVC's (premature ventricular contractions) 06/05/2014   BMI 29.0-29.9,adult 41/32/4401   Diastolic dysfunction- grade 1 by echo June 2015,  EF 50% 03/28/2014   COPD (chronic obstructive pulmonary disease) ([place]) 02/21/2014   OSA (obstructive sleep apnea) 12/07/2013   Sinus bradycardia- ? symptomatic 05/17/2013   Coronary artery disease involving nonautologous biological coronary bypass graft without angina pectoris    Thrombocytopenia (HCC)    S/P CABG x 3 08/21/2011   Hypertensive cardiovascular disease    Prostate cancer ([place])    Hyperlipidemia with target LDL less than 100    Controlled type 2 diabetes mellitus without complication, without long-term current use of insulin ([place]) 11/28/2010   PCP:  [name], DO Pharmacy:   [id number] [address]Phone: [phone]: [phone] [place] [address] [address]Phone: [phone]: [phone] [facility] [place]- [address] [place]Phone: [phone]: [phone]    Social Determinants of Health ([place] Social History: [place] No Food Insecurity (09/04/2022)  Housing: Low Risk  (09/04/2022)  Transportation Needs: No Transportation Needs (09/04/2022)  Utilities: Not At Risk (09/04/2022)  Alcohol Screen: Low Risk  (03/13/2022)  Depression (PHQ2-9): Low Risk  (04/24/2022)  Recent Concern: Depression (PHQ2-9) - High Risk (03/18/2022)  Financial Resource Strain: Low Risk  (08/04/2022)  Physical Activity: Inactive (03/13/2022)  Social Connections: Moderately Isolated (03/13/2022)  Stress: Stress Concern Present (03/13/2022)  Tobacco Use: Medium Risk (09/19/2022)   SDOH Interventions:     Readmission Risk Interventions    01/16/2022    2:12 PM  Readmission Risk Prevention Plan  Transportation Screening Complete  Medication Review (RN Care Manager) Referral to Pharmacy  PCP or Specialist appointment within 3-5 days of discharge Complete  HRI or HLuptonComplete  SW Recovery Care/Counseling Consult Complete  Palliative Care Screening Complete  SSt. JohnNot Applicable

## 2022-09-22 NOTE — Progress Notes (Signed)
Pharmacy Electrolyte Replacement  Recent Labs:  Recent Labs    09/22/22 0114 09/22/22 0839  K  --  3.0*  MG 2.1  --   PHOS 3.3  --   CREATININE  --  0.61    Low Critical Values (K </= 2.5, Phos </= 1, Mg </= 1) Present: None  Plan: Replace with 33mq per protocol. Re-check in AM.   HEsmeralda Arthur PharmD, BCCCP
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
## 2022-09-22 NOTE — Evaluation (Signed)
Occupational Therapy Evaluation Patient Details Name: [name] MRN: [id number] DOB: 17-Jan-1933 Today's Date: 09/22/2022   History of Present Illness Patient is a 89 year old male who presented to [facility] ED on 1/27 with AMS/hypotension. Pt found to be in septic shock from multifocal pneumonia with acute encephalopathy PMH: COPD, OSA not on CPAP, PAF, systolic HF, conduction disease/bradycardia s/p pacemaker placement, h/o CVA with residual L sided deficits.   Clinical Impression   Patient admitted for above and presents with problem list below. Pt oriented to self only, but following 1 step simple commands with increased time and approximately 75% accuracy. He requires min assist to wash face, overall min-total assist for ADLs and total assist required to reposition in bed for transition into chair position.  He is on RA during session with VSS, reports fatigued.   He is unable to reports PLOF, is calling out for wife during session.  Per chart he was needing assist for most ADLs and transfers into w/c.  No family present during evaluation. Based on performance today, believe he will benefit from continued OT services acutely to decrease burden of care upon return home.  Recommend HHOT services if family can still provide care, otherwise recommend SNF. Believe pt would benefit from palliative consult.      Recommendations for follow up therapy are one component of a multi-disciplinary discharge planning process, led by the attending physician.  Recommendations may be updated based on patient status, additional functional criteria and insurance authorization.   Follow Up Recommendations  Home health OT (SNF if family unable to provide 24/7 support)     Assistance Recommended at Discharge Frequent or constant Supervision/Assistance  Patient can return home with the following Two people to help with walking and/or transfers;Two people to help with bathing/dressing/bathroom;Direct supervision/assist  for medications management;Direct supervision/assist for financial management;Help with stairs or ramp for entrance;Assist for transportation    Functional Status Assessment     Equipment Recommendations  Other (comment) (hoyer lift)    Recommendations for Other Services Other (comment) (palliative care)     Precautions / Restrictions Precautions Precautions: Fall Precaution Comments: fragile skin, bil mitts Restrictions Weight Bearing Restrictions: No      Mobility Bed Mobility Overal bed mobility: Needs Assistance             General bed mobility comments: total assist for reposition into chair position in bed    Transfers                   General transfer comment: deferred      Balance Overall balance assessment: Needs assistance Sitting-balance support: Feet supported Sitting balance-Leahy Scale: Poor Sitting balance - Comments: upright in chair position, R lateral lean preference                                   ADL either performed or assessed with clinical judgement   ADL Overall ADL's : Needs assistance/impaired Eating/Feeding: NPO   Grooming: Wash/dry face;Minimal assistance;Bed level Grooming Details (indicate cue type and reason): proximal support at elbow         Upper Body Dressing : Maximal assistance Upper Body Dressing Details (indicate cue type and reason): upright in bed Lower Body Dressing: Total assistance;+2 for physical assistance;+2 for safety/equipment;Bed level     Toilet Transfer Details (indicate cue type and reason): deferred         Functional mobility during ADLs: Total assistance  General ADL Comments: repositioned into chair     Vision   Additional Comments: able to scan and locate therapist, continue assessment     Perception     Praxis      Pertinent Vitals/Pain Pain Assessment Pain Assessment: Faces Faces Pain Scale: No hurt Pain Intervention(s): Monitored during session      Hand Dominance Right   Extremity/Trunk Assessment Upper Extremity Assessment Upper Extremity Assessment: Generalized weakness   Lower Extremity Assessment Lower Extremity Assessment: Defer to PT evaluation       Communication Communication Communication: HOH;Expressive difficulties (dsyarthria, garbled speech)   Cognition Arousal/Alertness: Awake/alert Behavior During Therapy: Flat affect Overall Cognitive Status: History of cognitive impairments - at baseline                                 General Comments: pt oriented to self only.  Follows simple commands with increased time and approx 75% accuracy. Pt calling out for wife, [name]?, throughout session. No family present, but noted hx of cognitive deficits at baseline.     General Comments  VSS on RA, SPO2 maintained 95%    Exercises     Shoulder Instructions      Home Living Family/patient expects to be discharged to:: Private residence Living Arrangements: Children;Other relatives (sister) Available Help at Discharge: Family;Available 24 hours/day Type of Home: House Home Access: Stairs to enter     Home Layout: Two level;Laundry or work area in Building surveyor of Steps: 15 stairs Alternate Level Stairs-Rails: Can reach both Bathroom Shower/Tub: Occupational psychologist: Standard     Home Equipment: Conservation officer, nature (2 wheels);Shower seat;BSC/3in1;Cane - single point;Cane - quad;Rollator (4 wheels);Grab bars - tub/shower;Hand held shower head;Hospital bed;Wheelchair - manual   Additional Comments: pt lives with sister, unable to understand patient and no family available to provide home set up. Per chart pt lives with sister and is pleasantly confused at baseline. Setup per chart review of last admisison.      Prior Functioning/Environment Prior Level of Function : Needs assist             Mobility Comments: per notes from august admission pt  was  predominately bed bound and family transferring him in/out of w/c daily ADLs Comments: assist with most ADLs per chart review admission 03/2022, but prior to was independnet        OT Problem List: Decreased strength;Decreased activity tolerance;Impaired balance (sitting and/or standing);Decreased cognition;Decreased coordination;Decreased safety awareness;Decreased knowledge of use of DME or AE;Decreased knowledge of precautions      OT Treatment/Interventions: Self-care/ADL training;Therapeutic exercise;DME and/or AE instruction;Therapeutic activities;Balance training;Patient/family education    OT Goals(Current goals can be found in the care plan section) Acute Rehab OT Goals OT Goal Formulation: Patient unable to participate in goal setting Time For Goal Achievement: 10/06/22 Potential to Achieve Goals: Fair  OT Frequency: Min 2X/week    Co-evaluation              AM-PAC OT "6 Clicks" Daily Activity     Outcome Measure Help from another person eating meals?: Total Help from another person taking care of personal grooming?: A Lot Help from another person toileting, which includes using toliet, bedpan, or urinal?: Total Help from another person bathing (including washing, rinsing, drying)?: A Lot Help from another person to put on and taking off regular upper body clothing?: A Lot Help from another person to put on  and taking off regular lower body clothing?: Total 6 Click Score: 9   End of Session Nurse Communication: Mobility status  Activity Tolerance: Patient tolerated treatment well Patient left: with call bell/phone within reach;in bed;with bed alarm set;with restraints reapplied;with SCD's reapplied  OT Visit Diagnosis: Other abnormalities of gait and mobility (R26.89);Muscle weakness (generalized) (M62.81);Other symptoms and signs involving cognitive function                Time: 1040-4591 OT Time Calculation (min): 22 min Charges:  OT General Charges $OT Visit: 1  Visit OT Evaluation $OT Eval Moderate Complexity: 1 Mod  Jolaine Artist, OT Acute Rehabilitation Services Office [phone]   Delight Stare 09/22/2022, 10:43 AM
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
## 2022-09-23 DIAGNOSIS — A419 Sepsis, unspecified organism: Secondary | ICD-10-CM | POA: Diagnosis not present

## 2022-09-23 DIAGNOSIS — J189 Pneumonia, unspecified organism: Secondary | ICD-10-CM | POA: Diagnosis not present

## 2022-09-23 DIAGNOSIS — R6521 Severe sepsis with septic shock: Secondary | ICD-10-CM | POA: Diagnosis not present

## 2022-09-23 LAB — PATHOLOGIST SMEAR REVIEW

## 2022-09-23 LAB — BASIC METABOLIC PANEL
Anion gap: 7 (ref 5–15)
BUN: 39 mg/dL — ABNORMAL HIGH (ref 8–23)
CO2: 23 mmol/L (ref 22–32)
Calcium: 9.7 mg/dL (ref 8.9–10.3)
Chloride: 112 mmol/L — ABNORMAL HIGH (ref 98–111)
Creatinine, Ser: 0.66 mg/dL (ref 0.61–1.24)
GFR, Estimated: >60 mL/min (ref >60)
Glucose, Bld: 177 mg/dL — ABNORMAL HIGH (ref 70–99)
Potassium: 3.6 mmol/L (ref 3.5–5.1)
Sodium: 142 mmol/L (ref 135–145)

## 2022-09-23 LAB — GLUCOSE, CAPILLARY
Glucose-Capillary: 164 mg/dL — ABNORMAL HIGH (ref 70–99)
Glucose-Capillary: 173 mg/dL — ABNORMAL HIGH (ref 70–99)
Glucose-Capillary: 196 mg/dL — ABNORMAL HIGH (ref 70–99)
Glucose-Capillary: 260 mg/dL — ABNORMAL HIGH (ref 70–99)

## 2022-09-23 MED ORDER — POTASSIUM CHLORIDE 20 MEQ PO PACK
40.0000 meq | PACK | Freq: Once | ORAL | Status: AC
  Administered 2022-09-23: 40 meq
  Filled 2022-09-23: qty 2

## 2022-09-23 MED ORDER — PREDNISONE 10 MG PO TABS
10.0000 mg | ORAL_TABLET | Freq: Every morning | ORAL | Status: DC
  Administered 2022-09-24 – 2022-09-25 (×2): 10 mg
  Filled 2022-09-23 (×2): qty 1

## 2022-09-23 NOTE — Progress Notes (Signed)
[facility] ADULT ICU REPLACEMENT PROTOCOL   The patient does apply for the [facility] Adult ICU Electrolyte Replacment Protocol based on the criteria listed below:   1.Exclusion criteria: TCTS, ECMO, Dialysis, and Myasthenia Gravis patients 2. Is GFR >/= 30 ml/min? Yes.    Patient's GFR today is >60 3. Is SCr </= 2? Yes.   Patient's SCr is 0.66 mg/dL 4. Did SCr increase >/= 0.5 in 24 hours? No. 5.Pt's weight >40kg  Yes.   6. Abnormal electrolyte(s):   K 3.6  7. Electrolytes replaced per protocol 8.  Call MD STAT for K+ </= 2.5, Phos </= 1, or Mag </= 1 Physician:  A. Zigmund Gottron R Carlos Heber 09/23/2022 5:15 AM

## 2022-09-23 NOTE — Progress Notes (Signed)
Progress Note    [name]   BTD:[id number]  DOB: 02/02/1933  DOA: 09/19/2022     3 PCP: [name], DO  Initial CC: AMS  Hospital Course: Mr. Lollar is an 89 yo male with PMH COPD, OSA on CPAP, PAF, sCHF, bradycardia s/p PPM who presented with AMS/hypotension.  Upon arrival to [facility] ED on 1/27, patient altered not following commands. Afebrile. BP soft 92/51. Congested cough with rhonchi BS bilaterally. Was placed on NRB with sats 100%. CXR with mild interstitial edema. COVID, flu, RSV negative. LA 2.4 then 3.5. CTA chest/abd/pelvis negative for PE, worsening lower lobe bronchopneumonia suspicious for aspiration, large stool burden likely constipation. Cultures obtained and started on Rocephin/azithromycin. Was given IV fluids and remained hypotensive requiring levo. PCCM consulted for ICU admission.  Interval History:  No events overnight. Resting in bed with dysarthric speech, no teeth in place, and in NAD. Follows commands.   Assessment and Plan:  Septic shock, resolved Multifocal pneumonia suspicious for aspiration Patient is off vasopressor support Continue IV antibiotics with ceftriaxone and azithromycin - d/c steroids today Cultures have been negative   Acute septic encephalopathy CVA with residual left side deficits Mental status has improved CT head was negative for acute findings   Acute Hypoxemic Respiratory Failure due to pneumonia COPD: on symbicort, and chronic prednisone 10 mg daily OSA: Did not tolerate CPAP Patient is off oxygen Continue pulmicort/brovana bid; prn duoneb   PAF on eliquis Conduction disease/bradycardia s/p pacemaker placement Remaining paced rhythm with heart rate in 60s Continue amiodarone continue Eliquis   Chronic combined systolic and diastolic CHF CAD HTN HLD -Continue holding antihypertensives until blood pressure further recovers Monitor intake and output   Acute kidney injury due to dehydration Serum creatinine  is now back to baseline Monitor intake and output   T2DM Hemoglobin A1c 6.8 Continue SSI   Chronic thrombocytopenia Chronic Anemia Platelet and H&H stable Continue to monitor   Dysphagia s/p PEG tube placement Continue tube feeds   Depression Resume mirtazapine 15 mg   Old records reviewed in assessment of this patient  Antimicrobials: Azithro 1/27 >> current Rocephin 1/27 >> current   DVT prophylaxis:  SCDs Start: 09/20/22 0510 apixaban (ELIQUIS) tablet 5 mg   Code Status:   Code Status: DNR  Mobility Assessment (last 72 hours)     Mobility Assessment     Row Name 09/23/22 1500 09/22/22 1000 09/21/22 1300       Does patient have an order for bedrest or is patient medically unstable Yes- Bedfast (Level 1) - Complete -- --     What is the highest level of mobility based on the progressive mobility assessment? Level 1 (Bedfast) - Unable to balance while sitting on edge of bed Level 1 (Bedfast) - Unable to balance while sitting on edge of bed Level 1 (Bedfast) - Unable to balance while sitting on edge of bed     Is the above level different from baseline mobility prior to current illness? No - Consider discontinuing PT/OT -- --              Barriers to discharge:  Disposition Plan:  Home Status is: Inpt  Objective: Blood pressure 123/70, pulse 62, temperature 97.6 F (36.4 C), temperature source Oral, resp. rate (!) 28, height '5\' 11"'$  (1.803 m), weight 83.5 kg, SpO2 99 %.  Examination:  Physical Exam Constitutional:      Comments: Chronically ill-appearing elderly gentleman lying in bed in no distress  HENT:  Head: Normocephalic and atraumatic.     Mouth/Throat:     Mouth: Mucous membranes are moist.  Eyes:     Extraocular Movements: Extraocular movements intact.  Cardiovascular:     Rate and Rhythm: Normal rate and regular rhythm.  Pulmonary:     Effort: Pulmonary effort is normal.     Breath sounds: Rhonchi present.  Abdominal:     General:  Bowel sounds are normal. There is no distension.     Palpations: Abdomen is soft.     Comments: PEG in place  Musculoskeletal:        General: Normal range of motion.     Cervical back: Normal range of motion and neck supple.  Skin:    General: Skin is warm and dry.  Neurological:     Mental Status: Mental status is at baseline.      Consultants:    Procedures:    Data Reviewed: Results for orders placed or performed during the hospital encounter of 09/19/22 (from the past 24 hour(s))  Glucose, capillary     Status: Abnormal   Collection Time: 09/22/22  7:50 PM  Result Value Ref Range   Glucose-Capillary 169 (H) 70 - 99 mg/dL  Glucose, capillary     Status: Abnormal   Collection Time: 09/22/22 11:48 PM  Result Value Ref Range   Glucose-Capillary 164 (H) 70 - 99 mg/dL  Basic metabolic panel     Status: Abnormal   Collection Time: 09/23/22  2:18 AM  Result Value Ref Range   Sodium 142 135 - 145 mmol/L   Potassium 3.6 3.5 - 5.1 mmol/L   Chloride 112 (H) 98 - 111 mmol/L   CO2 23 22 - 32 mmol/L   Glucose, Bld 177 (H) 70 - 99 mg/dL   BUN 39 (H) 8 - 23 mg/dL   Creatinine, Ser 0.66 0.61 - 1.24 mg/dL   Calcium 9.7 8.9 - 10.3 mg/dL   GFR, Estimated >60 >60 mL/min   Anion gap 7 5 - 15  Glucose, capillary     Status: Abnormal   Collection Time: 09/23/22  3:19 AM  Result Value Ref Range   Glucose-Capillary 164 (H) 70 - 99 mg/dL  Glucose, capillary     Status: Abnormal   Collection Time: 09/23/22  8:03 AM  Result Value Ref Range   Glucose-Capillary 173 (H) 70 - 99 mg/dL  Glucose, capillary     Status: Abnormal   Collection Time: 09/23/22 11:54 AM  Result Value Ref Range   Glucose-Capillary 196 (H) 70 - 99 mg/dL    I have reviewed pertinent nursing notes, vitals, labs, and images as necessary. I have ordered labwork to follow up on as indicated.  I have reviewed the last notes from staff over past 24 hours. I have discussed patient's care plan and test results with nursing  staff, CM/SW, and other staff as appropriate.  Time spent: Greater than 50% of the 55 minute visit was spent in counseling/coordination of care for the patient as laid out in the A&P.   LOS: 3 days   Dwyane Dee, MD [facility] 09/23/2022, 4:11 PM

## 2022-09-23 NOTE — Hospital Course (Signed)
[name] is an 89 yo male with PMH COPD, OSA on CPAP, PAF, sCHF, bradycardia s/p PPM who presented with AMS/hypotension.  Upon arrival to [facility] ED on 1/27, patient altered not following commands. Afebrile. BP soft 92/51. Congested cough with rhonchi BS bilaterally. Was placed on NRB with sats 100%. CXR with mild interstitial edema. COVID, flu, RSV negative. LA 2.4 then 3.5. CTA chest/abd/pelvis negative for PE, worsening lower lobe bronchopneumonia suspicious for aspiration, large stool burden likely constipation. Cultures obtained and started on Rocephin/azithromycin. Was given IV fluids and remained hypotensive requiring levo. PCCM consulted for ICU admission.

## 2022-09-23 NOTE — Progress Notes (Signed)
Patient has numerous skin tears to right/left arms. Cleansed and new foam lift dressing applied,. New foam to buttock

## 2022-09-24 DIAGNOSIS — J189 Pneumonia, unspecified organism: Secondary | ICD-10-CM | POA: Diagnosis not present

## 2022-09-24 DIAGNOSIS — A419 Sepsis, unspecified organism: Secondary | ICD-10-CM | POA: Diagnosis not present

## 2022-09-24 DIAGNOSIS — R6521 Severe sepsis with septic shock: Secondary | ICD-10-CM | POA: Diagnosis not present

## 2022-09-24 LAB — GLUCOSE, CAPILLARY
Glucose-Capillary: 174 mg/dL — ABNORMAL HIGH (ref 70–99)
Glucose-Capillary: 179 mg/dL — ABNORMAL HIGH (ref 70–99)
Glucose-Capillary: 197 mg/dL — ABNORMAL HIGH (ref 70–99)
Glucose-Capillary: 198 mg/dL — ABNORMAL HIGH (ref 70–99)
Glucose-Capillary: 203 mg/dL — ABNORMAL HIGH (ref 70–99)
Glucose-Capillary: 243 mg/dL — ABNORMAL HIGH (ref 70–99)

## 2022-09-24 LAB — CBC WITH DIFFERENTIAL/PLATELET
Abs Immature Granulocytes: 0.02 10*3/uL (ref 0.00–0.07)
Basophils Absolute: 0 10*3/uL (ref 0.0–0.1)
Basophils Relative: 0 %
Eosinophils Absolute: 0 10*3/uL (ref 0.0–0.5)
Eosinophils Relative: 0 %
HCT: 27.6 % — ABNORMAL LOW (ref 39.0–52.0)
Hemoglobin: 8.4 g/dL — ABNORMAL LOW (ref 13.0–17.0)
Immature Granulocytes: 0 %
Lymphocytes Relative: 56 %
Lymphs Abs: 4.6 10*3/uL — ABNORMAL HIGH (ref 0.7–4.0)
MCH: 29.8 pg (ref 26.0–34.0)
MCHC: 30.4 g/dL (ref 30.0–36.0)
MCV: 97.9 fL (ref 80.0–100.0)
Monocytes Absolute: 1.5 10*3/uL — ABNORMAL HIGH (ref 0.1–1.0)
Monocytes Relative: 18 %
Neutro Abs: 2.2 10*3/uL (ref 1.7–7.7)
Neutrophils Relative %: 26 %
Platelets: 68 10*3/uL — ABNORMAL LOW (ref 150–400)
RBC: 2.82 MIL/uL — ABNORMAL LOW (ref 4.22–5.81)
RDW: 18.6 % — ABNORMAL HIGH (ref 11.5–15.5)
WBC Morphology: ABNORMAL
WBC: 8.3 10*3/uL (ref 4.0–10.5)
nRBC: 0 % (ref 0.0–0.2)

## 2022-09-24 LAB — BASIC METABOLIC PANEL
Anion gap: 6 (ref 5–15)
BUN: 33 mg/dL — ABNORMAL HIGH (ref 8–23)
CO2: 26 mmol/L (ref 22–32)
Calcium: 9.4 mg/dL (ref 8.9–10.3)
Chloride: 111 mmol/L (ref 98–111)
Creatinine, Ser: 0.69 mg/dL (ref 0.61–1.24)
GFR, Estimated: >60 mL/min (ref >60)
Glucose, Bld: 199 mg/dL — ABNORMAL HIGH (ref 70–99)
Potassium: 3.6 mmol/L (ref 3.5–5.1)
Sodium: 143 mmol/L (ref 135–145)

## 2022-09-24 LAB — LEGIONELLA PNEUMOPHILA SEROGP 1 UR AG: L. pneumophila Serogp 1 Ur Ag: NEGATIVE

## 2022-09-24 LAB — MAGNESIUM: Magnesium: 1.9 mg/dL (ref 1.7–2.4)

## 2022-09-24 MED ORDER — ZINC OXIDE 12.8 % EX OINT
TOPICAL_OINTMENT | Freq: Two times a day (BID) | CUTANEOUS | Status: DC
  Administered 2022-09-24: 1 via TOPICAL
  Filled 2022-09-24: qty 56.7

## 2022-09-24 NOTE — Progress Notes (Signed)
Progress Note    WAI LITT   IRJ:[id number]  DOB: 1933/07/24  DOA: 09/19/2022     4 PCP: [name], DO  Initial CC: AMS  Hospital Course: [name] is an 89 yo male with PMH COPD, OSA on CPAP, PAF, sCHF, bradycardia s/p PPM who presented with AMS/hypotension.  Upon arrival to [facility] ED on 1/27, patient altered not following commands. Afebrile. BP soft 92/51. Congested cough with rhonchi BS bilaterally. Was placed on NRB with sats 100%. CXR with mild interstitial edema. COVID, flu, RSV negative. LA 2.4 then 3.5. CTA chest/abd/pelvis negative for PE, worsening lower lobe bronchopneumonia suspicious for aspiration, large stool burden likely constipation. Cultures obtained and started on Rocephin/azithromycin. Was given IV fluids and remained hypotensive requiring levo. PCCM consulted for ICU admission.  Interval History:  No events overnight.  Has a little more energy when seen this morning.  Patient combative during PT/OT session this morning. Plan remains for SNF at discharge.  Assessment and Plan:  Septic shock, resolved Multifocal pneumonia suspicious for aspiration Patient is off vasopressor support Continue IV antibiotics with ceftriaxone and azithromycin - d/c'd steroids 1/30 Cultures have been negative   Acute septic encephalopathy CVA with residual left side deficits Mental status has improved CT head was negative for acute findings   Acute Hypoxemic Respiratory Failure due to pneumonia COPD: on symbicort, and chronic prednisone 10 mg daily OSA: Did not tolerate CPAP Patient is off oxygen Continue pulmicort/brovana bid; prn duoneb   PAF on eliquis Conduction disease/bradycardia s/p pacemaker placement Remaining paced rhythm with heart rate in 60s Continue amiodarone continue Eliquis   Chronic combined systolic and diastolic CHF CAD HTN HLD -Continue holding antihypertensives until blood pressure further recovers Monitor intake and output   Acute  kidney injury due to dehydration Serum creatinine is now back to baseline Monitor intake and output   T2DM Hemoglobin A1c 6.8 Continue SSI   Chronic thrombocytopenia Chronic Anemia Platelet and H&H stable Continue to monitor   Dysphagia s/p PEG tube placement Continue tube feeds   Depression Resume mirtazapine 15 mg   Old records reviewed in assessment of this patient  Antimicrobials: Azithro 1/27 >> current Rocephin 1/27 >> current   DVT prophylaxis:  SCDs Start: 09/20/22 0510 apixaban (ELIQUIS) tablet 5 mg   Code Status:   Code Status: DNR  Mobility Assessment (last 72 hours)     Mobility Assessment     Row Name 09/24/22 1200 09/24/22 1000 09/24/22 0735 09/23/22 2240 09/23/22 1500   Does patient have an order for bedrest or is patient medically unstable -- -- No - Continue assessment No - Continue assessment Yes- Bedfast (Level 1) - Complete   What is the highest level of mobility based on the progressive mobility assessment? Level 1 (Bedfast) - Unable to balance while sitting on edge of bed Level 1 (Bedfast) - Unable to balance while sitting on edge of bed Level 1 (Bedfast) - Unable to balance while sitting on edge of bed Level 1 (Bedfast) - Unable to balance while sitting on edge of bed Level 1 (Bedfast) - Unable to balance while sitting on edge of bed   Is the above level different from baseline mobility prior to current illness? -- -- -- Yes - Recommend PT order No - Consider discontinuing PT/OT    Row Name 09/22/22 1000           What is the highest level of mobility based on the progressive mobility assessment? Level 1 (Bedfast) - Unable  to balance while sitting on edge of bed                Barriers to discharge:  Disposition Plan:  Home Status is: Inpt  Objective: Blood pressure (!) 96/48, pulse 64, temperature 98.2 F (36.8 C), temperature source Oral, resp. rate 14, height '5\' 11"'$  (1.803 m), weight 86 kg, SpO2 98 %.  Examination:  Physical  Exam Constitutional:      Comments: Chronically ill-appearing elderly gentleman lying in bed in no distress  HENT:     Head: Normocephalic and atraumatic.     Mouth/Throat:     Mouth: Mucous membranes are moist.  Eyes:     Extraocular Movements: Extraocular movements intact.  Cardiovascular:     Rate and Rhythm: Normal rate and regular rhythm.  Pulmonary:     Effort: Pulmonary effort is normal.     Breath sounds: Rhonchi present.  Abdominal:     General: Bowel sounds are normal. There is no distension.     Palpations: Abdomen is soft.     Comments: PEG in place  Musculoskeletal:        General: Normal range of motion.     Cervical back: Normal range of motion and neck supple.  Skin:    General: Skin is warm and dry.  Neurological:     Mental Status: Mental status is at baseline.     Comments: Follows commands and moves all 4 extremities      Consultants:    Procedures:    Data Reviewed: Results for orders placed or performed during the hospital encounter of 09/19/22 (from the past 24 hour(s))  Glucose, capillary     Status: Abnormal   Collection Time: 09/23/22  7:50 PM  Result Value Ref Range   Glucose-Capillary 260 (H) 70 - 99 mg/dL  Glucose, capillary     Status: Abnormal   Collection Time: 09/24/22 12:41 AM  Result Value Ref Range   Glucose-Capillary 174 (H) 70 - 99 mg/dL  Glucose, capillary     Status: Abnormal   Collection Time: 09/24/22  3:09 AM  Result Value Ref Range   Glucose-Capillary 179 (H) 70 - 99 mg/dL  Glucose, capillary     Status: Abnormal   Collection Time: 09/24/22  8:07 AM  Result Value Ref Range   Glucose-Capillary 198 (H) 70 - 99 mg/dL  Basic metabolic panel     Status: Abnormal   Collection Time: 09/24/22  8:16 AM  Result Value Ref Range   Sodium 143 135 - 145 mmol/L   Potassium 3.6 3.5 - 5.1 mmol/L   Chloride 111 98 - 111 mmol/L   CO2 26 22 - 32 mmol/L   Glucose, Bld 199 (H) 70 - 99 mg/dL   BUN 33 (H) 8 - 23 mg/dL   Creatinine, Ser  0.69 0.61 - 1.24 mg/dL   Calcium 9.4 8.9 - 10.3 mg/dL   GFR, Estimated >60 >60 mL/min   Anion gap 6 5 - 15  CBC with Differential/Platelet     Status: Abnormal   Collection Time: 09/24/22  8:16 AM  Result Value Ref Range   WBC 8.3 4.0 - 10.5 K/uL   RBC 2.82 (L) 4.22 - 5.81 MIL/uL   Hemoglobin 8.4 (L) 13.0 - 17.0 g/dL   HCT 27.6 (L) 39.0 - 52.0 %   MCV 97.9 80.0 - 100.0 fL   MCH 29.8 26.0 - 34.0 pg   MCHC 30.4 30.0 - 36.0 g/dL   RDW 18.6 (H) 11.5 -  15.5 %   Platelets 68 (L) 150 - 400 K/uL   nRBC 0.0 0.0 - 0.2 %   Neutrophils Relative % 26 %   Neutro Abs 2.2 1.7 - 7.7 K/uL   Lymphocytes Relative 56 %   Lymphs Abs 4.6 (H) 0.7 - 4.0 K/uL   Monocytes Relative 18 %   Monocytes Absolute 1.5 (H) 0.1 - 1.0 K/uL   Eosinophils Relative 0 %   Eosinophils Absolute 0.0 0.0 - 0.5 K/uL   Basophils Relative 0 %   Basophils Absolute 0.0 0.0 - 0.1 K/uL   WBC Morphology Abnormal lymphocytes present    RBC Morphology MORPHOLOGY UNREMARKABLE    Smear Review MORPHOLOGY UNREMARKABLE    Immature Granulocytes 0 %   Abs Immature Granulocytes 0.02 0.00 - 0.07 K/uL  Magnesium     Status: None   Collection Time: 09/24/22  8:16 AM  Result Value Ref Range   Magnesium 1.9 1.7 - 2.4 mg/dL  Glucose, capillary     Status: Abnormal   Collection Time: 09/24/22 12:07 PM  Result Value Ref Range   Glucose-Capillary 203 (H) 70 - 99 mg/dL    I have reviewed pertinent nursing notes, vitals, labs, and images as necessary. I have ordered labwork to follow up on as indicated.  I have reviewed the last notes from staff over past 24 hours. I have discussed patient's care plan and test results with nursing staff, CM/SW, and other staff as appropriate.  Time spent: Greater than 50% of the 55 minute visit was spent in counseling/coordination of care for the patient as laid out in the A&P.   LOS: 4 days   Dwyane Dee, MD [facility] 09/24/2022, 2:45 PM

## 2022-09-24 NOTE — Progress Notes (Signed)
Physical Therapy Treatment Patient Details Name: [name] MRN: [id number] DOB: October 24, 1932 Today's Date: 09/24/2022   History of Present Illness Patient is a 89 year old male who presented to [facility] ED on 1/27 with AMS/hypotension. Pt found to be in septic shock from multifocal pneumonia with acute encephalopathy PMH: COPD, OSA not on CPAP, PAF, systolic HF, conduction disease/bradycardia s/p pacemaker placement, h/o CVA with residual L sided deficits.    PT Comments    Pt admitted with above diagnosis. Pt agitated at times and hitting at therapists.  Pt did not follow commands and for the most part was not participatory.  OT called pts caregiver and they agree that SNF is desired as they cannot care for pt at home.  Agree pt will need 24 hour care and most likely will be appropriate for lift equipment. Will continue to follow acutely.  Pt currently with functional limitations due to balance and endurance deficits. Pt will benefit from skilled PT to increase their independence and safety with mobility to allow discharge to the venue listed below.      Recommendations for follow up therapy are one component of a multi-disciplinary discharge planning process, led by the attending physician.  Recommendations may be updated based on patient status, additional functional criteria and insurance authorization.  Follow Up Recommendations  Skilled nursing-short term rehab (<3 hours/day) Can patient physically be transported by private vehicle: No   Assistance Recommended at Discharge Frequent or constant Supervision/Assistance  Patient can return home with the following Two people to help with walking and/or transfers;Two people to help with bathing/dressing/bathroom;Direct supervision/assist for medications management;Direct supervision/assist for financial management;Assist for transportation;Help with stairs or ramp for entrance;Assistance with feeding;Assistance with cooking/housework   Equipment  Recommendations  None recommended by PT (TBD at next venue)    Recommendations for Other Services       Precautions / Restrictions Precautions Precautions: Fall Precaution Comments: fragile skin, feeding tube Restrictions Weight Bearing Restrictions: No     Mobility  Bed Mobility Overal bed mobility: Needs Assistance Bed Mobility: Rolling Rolling: Total assist, +2 for physical assistance, +2 for safety/equipment         General bed mobility comments: total assist for all aspects of bed mobility, despite communication with patient he was combative and attempted to hit therapists.  PT and OT rolled pt and positioned better in bed and moved pillows that were for skin relief.    Transfers                   General transfer comment: deferred    Ambulation/Gait               General Gait Details: pt bed bound PTA   Stairs             Wheelchair Mobility    Modified Rankin (Stroke Patients Only)       Balance                                            Cognition Arousal/Alertness: Awake/alert Behavior During Therapy: Flat affect, Agitated Overall Cognitive Status: No family/caregiver present to determine baseline cognitive functioning                                 General Comments: Pt combative during session, attempting to hit therapists  during multiple occasions.        Exercises General Exercises - Lower Extremity Heel Slides: AAROM, Both, 5 reps, Supine    General Comments General comments (skin integrity, edema, etc.): VSS on RA      Pertinent Vitals/Pain Pain Assessment Pain Assessment: Faces Faces Pain Scale: Hurts little more Breathing: normal Negative Vocalization: none Facial Expression: smiling or inexpressive Body Language: relaxed Consolability: no need to console PAINAD Score: 0 Pain Location: generalized with movement Pain Descriptors / Indicators: Discomfort, Grimacing,  Moaning Pain Intervention(s): Limited activity within patient's tolerance, Monitored during session, Repositioned    Home Living Family/patient expects to be discharged to:: Skilled nursing facility Living Arrangements: Children Available Help at Discharge: Family;Available 24 hours/day Type of Home: House Home Access: Stairs to enter   [employer] of Steps: 1 through garage and 1 at sunroom Alternate Level Stairs-Number of Steps: 15 stairs Home Layout: Two level;Laundry or work area in [place]: Conservation officer, nature (2 wheels);Shower seat;BSC/3in1;Cane - single point;Cane - quad;Rollator (4 wheels);Grab bars - tub/shower;Hand held shower head;Hospital bed;Wheelchair - manual Additional Comments: pt lives with sister, unable to understand patient and no family available to provide home set up. Per chart pt lives with sister and is pleasantly confused at baseline. Setup per chart review of last admisison.    Prior Function            PT Goals (current goals can now be found in the care plan section) Acute Rehab PT Goals Patient Stated Goal: didn't state Progress towards PT goals: Not progressing toward goals - comment (Pt combative at times.  Not participatory)    Frequency    Min 2X/week      PT Plan Current plan remains appropriate    Co-evaluation PT/OT/SLP Co-Evaluation/Treatment: Yes Reason for Co-Treatment: Complexity of the patient's impairments (multi-system involvement);For patient/therapist safety PT goals addressed during session: Mobility/safety with mobility        AM-PAC PT "6 Clicks" Mobility   Outcome Measure  Help needed turning from your back to your side while in a flat bed without using bedrails?: Total Help needed moving from lying on your back to sitting on the side of a flat bed without using bedrails?: Total Help needed moving to and from a bed to a chair (including a wheelchair)?: Total Help needed standing up from a chair  using your arms (e.g., wheelchair or bedside chair)?: Total Help needed to walk in hospital room?: Total Help needed climbing 3-5 steps with a railing? : Total 6 Click Score: 6    End of Session   Activity Tolerance: Patient limited by fatigue Patient left: in bed;with call bell/phone within reach;with bed alarm set Nurse Communication: Need for lift equipment;Mobility status PT Visit Diagnosis: Muscle weakness (generalized) (M62.81);Unsteadiness on feet (R26.81)     Time: 0865-7846 PT Time Calculation (min) (ACUTE ONLY): 22 min  Charges:  $Therapeutic Activity: 8-22 mins                     [facility] M,PT Acute Rehab Services [phone]    Alvira Philips 09/24/2022, 12:36 PM

## 2022-09-24 NOTE — Progress Notes (Signed)
Occupational Therapy Treatment Patient Details Name: SENAN UREY MRN: [id number] DOB: July 08, 1933 Today's Date: 09/24/2022   History of present illness Patient is a 89 year old male who presented to [facility] ED on 1/27 with AMS/hypotension. Pt found to be in septic shock from multifocal pneumonia with acute encephalopathy PMH: COPD, OSA not on CPAP, PAF, systolic HF, conduction disease/bradycardia s/p pacemaker placement, h/o CVA with residual L sided deficits.   OT comments  Pt making limited progress towards OT goals this session. Pt opens eyes to voice, but seemed more agitated, resisting therapists despite attempts at multiple forms of communication. Multiple attempts to hit therapists during session. Pt was ultimately total +2 (although would hold hands with therapists) for rolling/repositioning, max A for washing face and oral care - which he really did NOT like (per daughter this is baseline- he does not like oral care). Basic PROM/AAROM performed with all 4 extremities. Saturated bandages on Bil forearms. Elevated with blankets to assist with edema management at end of session. After conversation with daughter [name], they are in agreement for SNF to maximize safety and independence in ADL and functional transfers,    Recommendations for follow up therapy are one component of a multi-disciplinary discharge planning process, led by the attending physician.  Recommendations may be updated based on patient status, additional functional criteria and insurance authorization.    Follow Up Recommendations  Skilled nursing-short term rehab (<3 hours/day)     Assistance Recommended at Discharge Frequent or constant Supervision/Assistance  Patient can return home with the following  Two people to help with walking and/or transfers;Two people to help with bathing/dressing/bathroom;Direct supervision/assist for medications management;Direct supervision/assist for financial management;Help with stairs or  ramp for entrance;Assist for transportation   Equipment Recommendations  Other (comment) (hoyer lift)    Recommendations for Other Services Other (comment) (palliative care)    Precautions / Restrictions Precautions Precautions: Fall Precaution Comments: fragile skin, feeding tube Restrictions Weight Bearing Restrictions: No       Mobility Bed Mobility Overal bed mobility: Needs Assistance Bed Mobility: Rolling Rolling: Total assist, +2 for physical assistance, +2 for safety/equipment         General bed mobility comments: total assist for all aspects of bed mobility, despite communication with patient he was combative and attempted to hit therapists    Transfers                   General transfer comment: deferred     Balance                                           ADL either performed or assessed with clinical judgement   ADL Overall ADL's : Needs assistance/impaired Eating/Feeding: NPO   Grooming: Bed level;Oral care;Maximal assistance;Wash/dry face Grooming Details (indicate cue type and reason): proximal support at elbow Upper Body Bathing: Total assistance;Bed level           Lower Body Dressing: Total assistance;Bed level       Toileting- Clothing Manipulation and Hygiene: Total assistance;+2 for physical assistance;+2 for safety/equipment;Bed level       Functional mobility during ADLs: Total assistance (uses hoyer lift since August)      Extremity/Trunk Assessment Upper Extremity Assessment Upper Extremity Assessment: Generalized weakness (baseline deficits in ROM of shoulders)   Lower Extremity Assessment Lower Extremity Assessment: Defer to PT evaluation   Cervical / Trunk Assessment  Cervical / Trunk Assessment: Kyphotic    Vision       Perception     Praxis      Cognition Arousal/Alertness: Awake/alert Behavior During Therapy: Flat affect, Agitated Overall Cognitive Status: No family/caregiver  present to determine baseline cognitive functioning                                 General Comments: Pt combative during session, attempting to hit therapists during multiple occasions.        Exercises      Shoulder Instructions       General Comments VSS on RA    Pertinent Vitals/ Pain       Pain Assessment Pain Assessment: Faces Faces Pain Scale: Hurts little more Pain Location: generalized with movement Pain Descriptors / Indicators: Discomfort, Grimacing, Moaning Pain Intervention(s): Limited activity within patient's tolerance, Monitored during session, Repositioned  Home Living Family/patient expects to be discharged to:: Skilled nursing facility Living Arrangements: Children Available Help at Discharge: Family;Available 24 hours/day Type of Home: House Home Access: Stairs to enter [employer] of Steps: 1 through garage and 1 at [employer]: Two level;Laundry or work area in Building surveyor of Steps: 15 stairs Alternate Level Stairs-Rails: Can reach both Bathroom Shower/Tub: Occupational psychologist: Standard Bathroom Accessibility: No   Home Equipment: Conservation officer, nature (2 wheels);Shower seat;BSC/3in1;Cane - single point;Cane - quad;Rollator (4 wheels);Grab bars - tub/shower;Hand held shower head;Hospital bed;Wheelchair - manual   Additional Comments: pt lives with sister, unable to understand patient and no family available to provide home set up. Per chart pt lives with sister and is pleasantly confused at baseline. Setup per chart review of last admisison.      Prior Functioning/Environment              Frequency  Min 2X/week        Progress Toward Goals  OT Goals(current goals can now be found in the care plan section)  Progress towards OT goals: Not progressing toward goals - comment (agitated today)  Acute Rehab OT Goals OT Goal Formulation: Patient unable to participate in  goal setting Time For Goal Achievement: 10/06/22 Potential to Achieve Goals: [place] Discharge plan needs to be updated    Co-evaluation    PT/OT/SLP Co-Evaluation/Treatment: Yes Reason for Co-Treatment: Necessary to address cognition/behavior during functional activity;For patient/therapist safety;To address functional/ADL transfers          AM-PAC OT "6 Clicks" Daily Activity     Outcome Measure   Help from another person eating meals?: Total Help from another person taking care of personal grooming?: A Lot Help from another person toileting, which includes using toliet, bedpan, or urinal?: Total Help from another person bathing (including washing, rinsing, drying)?: Total Help from another person to put on and taking off regular upper body clothing?: Total Help from another person to put on and taking off regular lower body clothing?: Total 6 Click Score: 7    End of Session    OT Visit Diagnosis: Other abnormalities of gait and mobility (R26.89);Muscle weakness (generalized) (M62.81);Other symptoms and signs involving cognitive function   Activity Tolerance Treatment limited secondary to agitation   Patient Left with call bell/phone within reach;in bed;with bed alarm set;with restraints reapplied;with SCD's reapplied   Nurse Communication Mobility status (oral care done)        Time: 6314-9702 OT Time Calculation (min):  23 min  Charges: OT General Charges $OT Visit: 1 Visit OT Treatments $Self Care/Home Management : 8-22 mins Jesse Sans OTR/L Acute Rehabilitation Services Office: [place] 09/24/2022, 10:51 AM

## 2022-09-24 NOTE — TOC Progression Note (Addendum)
Transition of Care [facility]) - Progression Note    Patient Details  Name: [name] MRN: [id number] Date of Birth: 05-16-1933  Transition of Care [facility]) CM/SW Contact  Jinger Neighbors, [place] Phone Number: 09/24/2022, 11:46 AM  Clinical Narrative:     CSW called pt's daughter, [name], to discuss family's disposition decision. [name] reports they are agreeing to SNF placement, but not at [facility] in [place]. [name] reports many concerns with facility; CSW provided [facility] complaint intake unit information. CSW will complete work up.  Work up and fax out complete       Expected Discharge Plan and Services                                               Social Determinants of Health (SDOH) Interventions SDOH Screenings   Food Insecurity: No Food Insecurity (09/23/2022)  Housing: Low Risk  (09/23/2022)  Transportation Needs: No Transportation Needs (09/04/2022)  Utilities: Not At Risk (09/04/2022)  Alcohol Screen: Low Risk  (03/13/2022)  Depression (PHQ2-9): Low Risk  (04/24/2022)  Recent Concern: Depression (PHQ2-9) - High Risk (03/18/2022)  Financial Resource Strain: Low Risk  (08/04/2022)  Physical Activity: Inactive (03/13/2022)  Social Connections: Moderately Isolated (03/13/2022)  Stress: Stress Concern Present (03/13/2022)  Tobacco Use: Medium Risk (09/19/2022)    Readmission Risk Interventions    01/16/2022    2:12 PM  Readmission Risk Prevention Plan  Transportation Screening Complete  Medication Review (RN Care Manager) Referral to Pharmacy  PCP or Specialist appointment within 3-5 days of discharge Complete  HRI or [place] Complete  SW Recovery Care/Counseling Consult Complete  [place] Not Applicable

## 2022-09-24 NOTE — [place] FL2 (Signed)
[place] MEDICAID FL2 LEVEL OF CARE FORM     IDENTIFICATION  Patient Name: [name] Birthdate: September 12, 1932 Sex: male Admission Date (Current Location): 09/19/2022  [facility] and [place] Number:  Herbalist and Address:  [facility] [place], [place] [address] [place], [place] [address]      Provider Number: [id number]  Attending Physician Name and Address:  Dwyane Dee, MD  Relative Name and Phone Number:  Neil Crouch    Current Level of Care: Hospital Recommended Level of Care: [place] Prior Approval Number:    Date Approved/Denied:   PASRR Number: [id number]  Discharge Plan: SNF    Current Diagnoses: Patient Active Problem List   Diagnosis Date Noted   Septic shock ([place]) 09/20/2022   Altered mental status 09/20/2022   Multifocal pneumonia 09/20/2022   Pain in joint of left shoulder 07/01/2022   Adult failure to thrive 04/16/2022   Acute cystitis 03/25/2022   Acute respiratory failure with hypoxia ([place])    Aspiration pneumonia of both lungs due to gastric secretions ([place])    Hematuria 02/04/2022   Hypernatremia 01/28/2022   Goals of care, counseling/discussion 01/27/2022   Delirium 01/27/2022   Pressure ulcer 01/25/2022   Symptomatic anemia 01/25/2022   Cellulitis 01/24/2022   Dysphagia 01/24/2022   Status post insertion of percutaneous endoscopic gastrostomy (PEG) tube ([place]) 01/24/2022   Urinary retention 01/24/2022   Hypotension 01/24/2022   Diarrhea 01/24/2022   Abnormal LFTs 01/24/2022   Protein-calorie malnutrition, severe 01/07/2022   Acute encephalopathy 01/06/2022   Acute kidney injury superimposed on chronic kidney disease ([place]) 01/06/2022   C6 cervical fracture ([place]) 01/06/2022   History of pelvic hematoma 01/06/2022   Dysarthria 01/06/2022   Closed fracture of multiple pubic rami, right, sequela 70/62/3762   Complicated UTI (urinary tract infection) 06/23/2021   HFrEF (heart failure with reduced  ejection fraction) ([place]) 06/23/2021   Acute kidney injury ([place]) 06/23/2021   COVID-19 virus RNA test result positive at limit of detection 02/01/2021   Kidney stones 07/04/2020   Hyperlipidemia associated with type 2 diabetes mellitus ([place]) 07/05/2019   Paroxysmal atrial fibrillation ([place]) 06/29/2019   Acquired thrombophilia ([place]) 06/29/2019   Second degree Mobitz II AV block 02/22/2019   B12 deficiency 12/18/2017   Status post right knee replacement 11/10/2016   S/P repair of abdominal aortic aneurysm using bifurcation graft 10/03/2016   Mixed incontinence 04/03/2015   PVC's (premature ventricular contractions) 06/05/2014   BMI 29.0-29.9,adult 83/15/1761   Diastolic dysfunction- grade 1 by echo June 2015, EF 50% 03/28/2014   COPD (chronic obstructive pulmonary disease) ([place]) 02/21/2014   OSA (obstructive sleep apnea) 12/07/2013   Sinus bradycardia- ? symptomatic 05/17/2013   Coronary artery disease involving nonautologous biological coronary bypass graft without angina pectoris    Thrombocytopenia (HCC)    S/P CABG x 3 08/21/2011   Hypertensive cardiovascular disease    Prostate cancer ([place])    Hyperlipidemia with target LDL less than 100    Controlled type 2 diabetes mellitus without complication, without long-term current use of insulin ([place]) 11/28/2010    Orientation RESPIRATION BLADDER Height & Weight     Self  Normal Incontinent, External catheter Weight: 189 lb 9.5 oz (86 kg) Height:  '5\' 11"'$  (180.3 cm)  BEHAVIORAL SYMPTOMS/MOOD NEUROLOGICAL BOWEL NUTRITION STATUS      Incontinent Feeding tube  AMBULATORY STATUS COMMUNICATION OF NEEDS Skin   Extensive Assist Verbally Normal  Personal Care Assistance Level of Assistance  Bathing, Feeding, Dressing Bathing Assistance: Maximum assistance Feeding assistance: Maximum assistance Dressing Assistance: Maximum assistance     Functional Limitations Info  Sight, Hearing, Speech Sight Info:  Adequate Hearing Info: Adequate Speech Info: Adequate    SPECIAL CARE FACTORS FREQUENCY  PT (By licensed PT), OT (By licensed OT)     PT Frequency: 5x/wk OT Frequency: 5x/wk            Contractures Contractures Info: Not present    Additional Factors Info  Code Status Code Status Info: DNR             Current Medications (09/24/2022):  This is the current hospital active medication list Current Facility-Administered Medications  Medication Dose Route Frequency Provider Last Rate Last Admin   acetaminophen (TYLENOL) tablet 650 mg  650 mg Per Tube Q4H PRN Mick Sell, PA-C   650 mg at 09/23/22 1610   amiodarone (PACERONE) tablet 200 mg  200 mg Per Tube Daily Hunsucker, Bonna Gains, MD   200 mg at 09/24/22 9604   apixaban (ELIQUIS) tablet 5 mg  5 mg Per Tube BID Ventura Sellers, RPH   5 mg at 09/24/22 5409   arformoterol (BROVANA) nebulizer solution 15 mcg  15 mcg Nebulization BID Mick Sell, PA-C   15 mcg at 09/24/22 0735   azithromycin (ZITHROMAX) 500 mg in sodium chloride 0.9 % 250 mL IVPB  500 mg Intravenous Q24H Jacky Kindle, MD 250 mL/hr at 09/23/22 2326 500 mg at 09/23/22 2326   budesonide (PULMICORT) nebulizer solution 0.5 mg  0.5 mg Nebulization BID Mick Sell, PA-C   0.5 mg at 09/24/22 8119   Chlorhexidine Gluconate Cloth 2 % PADS 6 each  6 each Topical Daily Hunsucker, Bonna Gains, MD   6 each at 09/24/22 1478   docusate (COLACE) 50 MG/5ML liquid 100 mg  100 mg Per Tube BID Andres Labrum D, PA-C   100 mg at 09/24/22 2956   famotidine (PEPCID) tablet 20 mg  20 mg Per Tube Daily Hunsucker, Bonna Gains, MD   20 mg at 09/24/22 2130   feeding supplement (VITAL AF 1.2 CAL) liquid 1,000 mL  1,000 mL Per J Tube Continuous Hunsucker, Bonna Gains, MD 70 mL/hr at 09/24/22 0642 1,000 mL at 09/24/22 8657   free water 100 mL  100 mL Per Tube Q4H Chand, Currie Paris, MD   100 mL at 09/24/22 0942   insulin aspart (novoLOG) injection 0-15 Units  0-15 Units Subcutaneous Q4H Jennelle Human B,  NP   3 Units at 09/24/22 0941   ipratropium-albuterol (DUONEB) 0.5-2.5 (3) MG/3ML nebulizer solution 3 mL  3 mL Nebulization Q4H PRN Andres Labrum D, PA-C       melatonin tablet 5 mg  5 mg Per Tube QHS Margaretha Seeds, MD   5 mg at 09/23/22 2220   mirtazapine (REMERON) tablet 15 mg  15 mg Per Tube QHS Jacky Kindle, MD   15 mg at 09/23/22 2221   Oral care mouth rinse  15 mL Mouth Rinse PRN Hunsucker, Bonna Gains, MD       polyethylene glycol (MIRALAX / GLYCOLAX) packet 17 g  17 g Per Tube Daily Mick Sell, PA-C   17 g at 09/24/22 8469   predniSONE (DELTASONE) tablet 10 mg  10 mg Per Tube q AM Dwyane Dee, MD   10 mg at 09/24/22 6295     Discharge Medications: Please see discharge summary for a list of discharge  medications.  Relevant Imaging Results:  Relevant Lab Results:   Additional Information SS#: [id number]  Jinger Neighbors, [name]

## 2022-09-25 DIAGNOSIS — Z7189 Other specified counseling: Secondary | ICD-10-CM | POA: Diagnosis not present

## 2022-09-25 DIAGNOSIS — R627 Adult failure to thrive: Secondary | ICD-10-CM | POA: Diagnosis not present

## 2022-09-25 DIAGNOSIS — J189 Pneumonia, unspecified organism: Secondary | ICD-10-CM | POA: Diagnosis not present

## 2022-09-25 DIAGNOSIS — A419 Sepsis, unspecified organism: Secondary | ICD-10-CM | POA: Diagnosis not present

## 2022-09-25 LAB — CBC WITH DIFFERENTIAL/PLATELET
Abs Immature Granulocytes: 0.05 10*3/uL (ref 0.00–0.07)
Basophils Absolute: 0 10*3/uL (ref 0.0–0.1)
Basophils Relative: 0 %
Eosinophils Absolute: 0 10*3/uL (ref 0.0–0.5)
Eosinophils Relative: 0 %
HCT: 29 % — ABNORMAL LOW (ref 39.0–52.0)
Hemoglobin: 9 g/dL — ABNORMAL LOW (ref 13.0–17.0)
Immature Granulocytes: 1 %
Lymphocytes Relative: 50 %
Lymphs Abs: 4.3 10*3/uL — ABNORMAL HIGH (ref 0.7–4.0)
MCH: 30.4 pg (ref 26.0–34.0)
MCHC: 31 g/dL (ref 30.0–36.0)
MCV: 98 fL (ref 80.0–100.0)
Monocytes Absolute: 1.3 10*3/uL — ABNORMAL HIGH (ref 0.1–1.0)
Monocytes Relative: 16 %
Neutro Abs: 2.8 10*3/uL (ref 1.7–7.7)
Neutrophils Relative %: 33 %
Platelets: 78 10*3/uL — ABNORMAL LOW (ref 150–400)
RBC: 2.96 MIL/uL — ABNORMAL LOW (ref 4.22–5.81)
RDW: 18.6 % — ABNORMAL HIGH (ref 11.5–15.5)
WBC: 8.5 10*3/uL (ref 4.0–10.5)
nRBC: 0 % (ref 0.0–0.2)

## 2022-09-25 LAB — BASIC METABOLIC PANEL
Anion gap: 4 — ABNORMAL LOW (ref 5–15)
BUN: 30 mg/dL — ABNORMAL HIGH (ref 8–23)
CO2: 30 mmol/L (ref 22–32)
Calcium: 9.5 mg/dL (ref 8.9–10.3)
Chloride: 110 mmol/L (ref 98–111)
Creatinine, Ser: 0.56 mg/dL — ABNORMAL LOW (ref 0.61–1.24)
GFR, Estimated: >60 mL/min (ref >60)
Glucose, Bld: 214 mg/dL — ABNORMAL HIGH (ref 70–99)
Potassium: 3.7 mmol/L (ref 3.5–5.1)
Sodium: 144 mmol/L (ref 135–145)

## 2022-09-25 LAB — GLUCOSE, CAPILLARY
Glucose-Capillary: 189 mg/dL — ABNORMAL HIGH (ref 70–99)
Glucose-Capillary: 206 mg/dL — ABNORMAL HIGH (ref 70–99)
Glucose-Capillary: 211 mg/dL — ABNORMAL HIGH (ref 70–99)
Glucose-Capillary: 222 mg/dL — ABNORMAL HIGH (ref 70–99)

## 2022-09-25 LAB — CULTURE, BLOOD (ROUTINE X 2)
Culture: NO GROWTH
Culture: NO GROWTH
Special Requests: ADEQUATE
Special Requests: ADEQUATE

## 2022-09-25 LAB — MAGNESIUM: Magnesium: 2 mg/dL (ref 1.7–2.4)

## 2022-09-25 MED ORDER — LORAZEPAM 2 MG/ML PO CONC: 2.0000 mg | ORAL | Status: DC | PRN

## 2022-09-25 MED ORDER — SCOPOLAMINE 1 MG/3DAYS TD PT72
1.0000 | MEDICATED_PATCH | TRANSDERMAL | Status: DC
  Administered 2022-09-25: 1.5 mg via TRANSDERMAL
  Filled 2022-09-25: qty 1

## 2022-09-25 MED ORDER — LIDOCAINE HCL URETHRAL/MUCOSAL 2 % EX GEL
1.0000 | Freq: Once | CUTANEOUS | Status: AC
  Administered 2022-09-25: 1 via URETHRAL
  Filled 2022-09-25: qty 6

## 2022-09-25 MED ORDER — GLYCOPYRROLATE 0.2 MG/ML IJ SOLN: 0.2000 mg | INTRAMUSCULAR | Status: DC | PRN

## 2022-09-25 MED ORDER — POLYVINYL ALCOHOL 1.4 % OP SOLN: 1.0000 [drp] | Freq: Four times a day (QID) | OPHTHALMIC | Status: DC | PRN

## 2022-09-25 MED ORDER — GLYCOPYRROLATE 1 MG PO TABS: 1.0000 mg | ORAL_TABLET | ORAL | Status: DC | PRN

## 2022-09-25 MED ORDER — ACETAMINOPHEN 325 MG PO TABS: 650.0000 mg | ORAL_TABLET | Freq: Four times a day (QID) | ORAL | Status: DC | PRN

## 2022-09-25 MED ORDER — HALOPERIDOL 1 MG PO TABS: 0.5000 mg | ORAL_TABLET | ORAL | Status: DC | PRN

## 2022-09-25 MED ORDER — ACETAMINOPHEN 650 MG RE SUPP: 650.0000 mg | Freq: Four times a day (QID) | RECTAL | Status: DC | PRN

## 2022-09-25 MED ORDER — HALOPERIDOL LACTATE 2 MG/ML PO CONC: 0.5000 mg | ORAL | Status: DC | PRN

## 2022-09-25 MED ORDER — MORPHINE SULFATE (PF) 2 MG/ML IV SOLN
1.0000 mg | INTRAVENOUS | Status: DC | PRN
  Administered 2022-09-25 (×3): 1 mg via INTRAVENOUS
  Filled 2022-09-25 (×3): qty 1

## 2022-09-25 MED ORDER — HALOPERIDOL LACTATE 5 MG/ML IJ SOLN: 0.5000 mg | INTRAMUSCULAR | Status: DC | PRN

## 2022-09-25 NOTE — TOC Progression Note (Addendum)
Transition of Care [facility]) - Progression Note    Patient Details  Name: [name] MRN: [id number] Date of Birth: 25-Mar-1933  Transition of Care [facility]) CM/SW [place], RN Phone Number: 09/25/2022, 2:32 PM  Clinical Narrative:    CM met with the patient's family at the bedside including daughter, Neil Crouch and wife.  Attending physician has requested CM speak with the family regarding Inpatient Hospice Medical facility placement.  I discuss with the family and offered Medicare choice regarding Hospice facility and the family chose [facility] in [place], [place].  I called [facility] and referral was placed.  The facility has an available bed and will reach out to the family and follow up with CM.  09/25/22 - CM spoke with [name], CM with [facility] and the RN will come to visit the patient at 4 pm tonight and determine eligibility.  If patient meets criteria - the patient can transfer to the available bed in the am.  MD and family are aware.  09/25/22 1624 - [place] with [place] facility has accepted him for inpatient admission in the am.  Hospice facility has arranged for transportation to pick the patient up by ambulance transport in the am around 10 am.  Bedside nursing and MD aware aware.  I have asked the attending to sign the DNR located in the chart.   Expected Discharge Plan: [place] Barriers to Discharge: No Barriers Identified  Expected Discharge Plan and Services   Discharge Planning Services: CM Consult Post Acute Care Choice: Residential Hospice Bed Living arrangements for the past 2 months: [place] Date [place]: 09/25/22 Time [place]: 1430 Representative spoke with at [place]: [place] with [facility] ([place] facility - for [facility], [place],  [place])   Social Determinants of Health (SDOH) Interventions SDOH Screenings   Food Insecurity: No Food Insecurity (09/23/2022)  Housing: Low Risk  (09/23/2022)  Transportation Needs: No Transportation Needs (09/04/2022)  Utilities: Not At Risk (09/04/2022)  Alcohol Screen: Low Risk  (03/13/2022)  Depression (PHQ2-9): Low Risk  (04/24/2022)  Recent Concern: Depression (PHQ2-9) - High Risk (03/18/2022)  Financial Resource Strain: Low Risk  (08/04/2022)  Physical Activity: Inactive (03/13/2022)  Social Connections: Moderately Isolated (03/13/2022)  Stress: Stress Concern Present (03/13/2022)  Tobacco Use: Medium Risk (09/19/2022)    Readmission Risk Interventions    09/25/2022    2:29 PM 01/16/2022    2:12 PM  Readmission Risk Prevention Plan  Transportation Screening Complete Complete  Medication Review (RN Care Manager) Complete Referral to Pharmacy  PCP or Specialist appointment within 3-5 days of discharge Complete Complete  HRI or Home Care Consult Complete Complete  SW Recovery Care/Counseling Consult Complete Complete  Palliative Care Screening Complete Complete  [place] Not Applicable Not Applicable

## 2022-09-25 NOTE — Inpatient Diabetes Management (Signed)
Inpatient Diabetes Program Recommendations  AACE/ADA: New Consensus Statement on Inpatient Glycemic Control (2015)  Target Ranges:  Prepandial:   less than 140 mg/dL      Peak postprandial:   less than 180 mg/dL (1-2 hours)      Critically ill patients:  140 - 180 mg/dL   Lab Results  Component Value Date   GLUCAP 211 (H) 09/25/2022   HGBA1C 6.8 (H) 09/20/2022    Review of Glycemic Control  Latest Reference Range & Units 09/25/22 00:16 09/25/22 04:20  Glucose-Capillary 70 - 99 mg/dL 206 (H) 211 (H)  (H): Data is abnormally high Diabetes history: Type 2 DM Outpatient Diabetes medications: Metformin 500 mg QD Current orders for Inpatient glycemic control: Novolog 0-15 units Q4H Vital @ 70 ml/hr  Inpatient Diabetes Program Recommendations:   With rate of tube feeds increased: Consider adding Novolog 3 units Q4H for tube feed coverage (to be stopped or held in the event tube feeds are stopped).  Thanks, Bronson Curb, MSN, RNC-OB Diabetes Coordinator [phone] (8a-5p)

## 2022-09-25 NOTE — Progress Notes (Signed)
Met with family and Pt, approximately 7 nieces, daughters and wife. Family expressed faith in afterlife and Jesus.  Family discussed various accomplishments of Pt and his role in the community of faith.  They shared photo, prayed and praised, sung, laughed and cried. They are appeared to be a strong support system for one another. Pt was alert and present.

## 2022-09-25 NOTE — Progress Notes (Signed)
Physical Therapy Treatment Patient Details Name: [name] MRN: [id number] DOB: [birth year] Today's Date: 09/25/2022   History of Present Illness Patient is a 89 year old male who presented to [facility] ED on 1/27 with AMS/hypotension. Pt found to be in septic shock from multifocal pneumonia with acute encephalopathy PMH: COPD, OSA not on CPAP, PAF, systolic HF, conduction disease/bradycardia s/p pacemaker placement, h/o CVA with residual L sided deficits.    PT Comments    Pt admitted with above diagnosis. Pt was able to sit EOB x 10 min with min to max assist of 2 persons.  Pt varies in ability to maintain balance with left and posterior lean.  Pt with minimal use of left UE due to edema and pain in left UE.  Daughter present and pt does work with PT better when daughter is present.  Will continue to follow as able.  Pt currently with functional limitations due to balance and endurance deficits. Pt will benefit from skilled PT to increase their independence and safety with mobility to allow discharge to the venue listed below.      Recommendations for follow up therapy are one component of a multi-disciplinary discharge planning process, led by the attending physician.  Recommendations may be updated based on patient status, additional functional criteria and insurance authorization.  Follow Up Recommendations  Skilled nursing-short term rehab (<3 hours/day) Can patient physically be transported by private vehicle: No   Assistance Recommended at Discharge Frequent or constant Supervision/Assistance  Patient can return home with the following Two people to help with walking and/or transfers;Two people to help with bathing/dressing/bathroom;Direct supervision/assist for medications management;Direct supervision/assist for financial management;Assist for transportation;Help with stairs or ramp for entrance;Assistance with feeding;Assistance with cooking/housework   Equipment Recommendations  None  recommended by PT (TBD at next venue)    Recommendations for Other Services       Precautions / Restrictions Precautions Precautions: Fall Precaution Comments: fragile skin, feeding tube Restrictions Weight Bearing Restrictions: No     Mobility  Bed Mobility Overal bed mobility: Needs Assistance Bed Mobility: Rolling, Supine to Sit Rolling: Total assist, +2 for physical assistance, +2 for safety/equipment   Supine to sit: Total assist, HOB elevated, +2 for physical assistance     General bed mobility comments: total assist for all aspects of bed mobility.  PT and OT rolled pt and positioned better in bed and moved pillows that were for skin relief.    Transfers                   General transfer comment: deferred    Ambulation/Gait               General Gait Details: pt bed bound PTA   Stairs             Wheelchair Mobility    Modified Rankin (Stroke Patients Only)       Balance Overall balance assessment: Needs assistance Sitting-balance support: Feet supported, Bilateral upper extremity supported Sitting balance-Leahy Scale: Poor Sitting balance - Comments: Pt was able to sit EOB for 10 minutes with left lateral lean at times with pt needing min to max assist.  Pt varies as he has poor balance reactions. Minimal use of left UE due to edema in hand and wrist. Postural control: Left lateral lean  Cognition Arousal/Alertness: Awake/alert Behavior During Therapy: Flat affect Overall Cognitive Status: No family/caregiver present to determine baseline cognitive functioning                                 General Comments: Pt wife and daughter present and pt more cooperative        Exercises General Exercises - Lower Extremity Long Arc Quad: AAROM, Both, 5 reps, Seated Heel Slides: AAROM, Both, 5 reps, Supine    General Comments        Pertinent Vitals/Pain Pain  Assessment Pain Assessment: Faces Faces Pain Scale: Hurts little more Breathing: normal Negative Vocalization: none Facial Expression: smiling or inexpressive Body Language: relaxed Consolability: no need to console PAINAD Score: 0 Pain Location: generalized with movement Pain Descriptors / Indicators: Discomfort, Grimacing, Moaning Pain Intervention(s): Limited activity within patient's tolerance, Monitored during session, Repositioned    Home Living                          Prior Function            PT Goals (current goals can now be found in the care plan section) Acute Rehab PT Goals Patient Stated Goal: didn't state Progress towards PT goals: Progressing toward goals    Frequency    Min 2X/week      PT Plan Current plan remains appropriate    Co-evaluation              AM-PAC PT "6 Clicks" Mobility   Outcome Measure  Help needed turning from your back to your side while in a flat bed without using bedrails?: Total Help needed moving from lying on your back to sitting on the side of a flat bed without using bedrails?: Total Help needed moving to and from a bed to a chair (including a wheelchair)?: Total Help needed standing up from a chair using your arms (e.g., wheelchair or bedside chair)?: Total Help needed to walk in hospital room?: Total Help needed climbing 3-5 steps with a railing? : Total 6 Click Score: 6    End of Session   Activity Tolerance: Patient limited by fatigue;Patient limited by pain Patient left: in bed;with call bell/phone within reach;with bed alarm set;with family/visitor present Nurse Communication: Need for lift equipment;Mobility status PT Visit Diagnosis: Muscle weakness (generalized) (M62.81);Unsteadiness on feet (R26.81)     Time: 6945-0388 PT Time Calculation (min) (ACUTE ONLY): 23 min  Charges:  $Therapeutic Exercise: 8-22 mins $Therapeutic Activity: 8-22 mins                     [facility] M,PT Acute Rehab  Services [phone]    Alvira Philips 09/25/2022, 2:04 PM

## 2022-09-25 NOTE — Progress Notes (Signed)
Progress Note    [name]   JJK:[id number]  DOB: 1932-11-25  DOA: 09/19/2022     5 PCP: Calvin Norlander, DO  Initial CC: AMS  Hospital Course: [name] is an 89 yo male with PMH COPD, OSA on CPAP, PAF, sCHF, bradycardia s/p PPM who presented with AMS/hypotension.  Upon arrival to [facility] ED on 1/27, patient altered not following commands. Afebrile. BP soft 92/51. Congested cough with rhonchi BS bilaterally. Was placed on NRB with sats 100%. CXR with mild interstitial edema. COVID, flu, RSV negative. LA 2.4 then 3.5. CTA chest/abd/pelvis negative for PE, worsening lower lobe bronchopneumonia suspicious for aspiration, large stool burden likely constipation. Cultures obtained and started on Rocephin/azithromycin. Was given IV fluids and remained hypotensive requiring levo. PCCM consulted for ICU admission.  Interval History:  No events overnight. Wife and daughter are present this afternoon.  We had a long discussion bedside together.  His wife endorsed that she has noticed a very slow and progressive decline since his stroke the middle of 2023.  He has not shown any significant improvement and she sees that he continues to have recurrent hospitalizations and infections with a very poor quality of life.  She became tearful and states that she would not want to continue aggressive treatments as has been done.  We then discussed a possible transition to comfort care and pursuing residential hospice.  His daughter also confirmed his lack of improvement over the past several months and ongoing poor quality of life as noted.  They were very accepting and in favor of pursuit of hospice and comfort care.  They wished for a hospice home in [facility] versus patient remaining in hospital on comfort care. They are okay with discontinuing tube feeds and unnecessary medications, and truly focusing on keeping patient comfortable.  He has been having ongoing secretions and difficulty clearing his  throat which is also a chronic issue for the patient for which they have suction at home for.  They were strongly asking for the ability to continue ongoing suction for comfort while he is in his current hospital room.  We will also start medications to help try and dry some of his secretions.  Assessment and Plan:  Goals of care - as noted above, wife expressed a strong desire for transitioning to hospice when met with her bedside on 2/1. His daughter is also bedside whom agrees with transition and that his QOL has been poor and he continues to slowly decline - in context of this conversation, we will d/c TF and start focusing on comfort - they prefer a hospice house near [facility] vs staying in the hospital if unable to be located   Septic shock, resolved Multifocal pneumonia suspicious for aspiration Patient is off vasopressor support - completed abx inpatient - d/c'd steroids 1/30 Cultures have been negative   Acute septic encephalopathy CVA with residual left side deficits Mental status has improved some and family has been able to have some conversations with him intermittently these past several days and they are okay still with hospice  CT head was negative for acute findings   Acute Hypoxemic Respiratory Failure due to pneumonia COPD OSA - d/c meds and focus on comfort - O2 okay for comfort at 2L   PAF on eliquis Conduction disease/bradycardia s/p pacemaker placement -Discontinue Eliquis and amiodarone   Chronic combined systolic and diastolic CHF CAD HTN HLD -Continue holding antihypertensives as BP has not needed   Acute kidney injury due  to dehydration Serum creatinine is now back to baseline - continue comfort care   T2DM Hemoglobin A1c 6.8 - continue comfort care   Chronic thrombocytopenia Chronic Anemia Platelet and H&H stable   Dysphagia s/p PEG tube placement - d/c tube feeds in setting of comfort care after discussion with daughter and  wife   Depression - continue comfort care    Old records reviewed in assessment of this patient  Antimicrobials: Azithro 1/27 >> 2/1 Rocephin 1/27 >> 2/1   DVT prophylaxis:  SCDs Start: 09/20/22 0510   Code Status:   Code Status: DNR  Mobility Assessment (last 72 hours)     Mobility Assessment     Row Name 09/25/22 1300 09/25/22 0737 09/24/22 2200 09/24/22 1200 09/24/22 1000   Does patient have an order for bedrest or is patient medically unstable -- No - Continue assessment No - Continue assessment -- --   What is the highest level of mobility based on the progressive mobility assessment? Level 1 (Bedfast) - Unable to balance while sitting on edge of bed Level 1 (Bedfast) - Unable to balance while sitting on edge of bed Level 1 (Bedfast) - Unable to balance while sitting on edge of bed Level 1 (Bedfast) - Unable to balance while sitting on edge of bed Level 1 (Bedfast) - Unable to balance while sitting on edge of bed   Is the above level different from baseline mobility prior to current illness? -- -- Yes - Recommend PT order -- --    Row Name 09/24/22 0735 09/23/22 2240 09/23/22 1500       Does patient have an order for bedrest or is patient medically unstable No - Continue assessment No - Continue assessment Yes- Bedfast (Level 1) - Complete     What is the highest level of mobility based on the progressive mobility assessment? Level 1 (Bedfast) - Unable to balance while sitting on edge of bed Level 1 (Bedfast) - Unable to balance while sitting on edge of bed Level 1 (Bedfast) - Unable to balance while sitting on edge of bed     Is the above level different from baseline mobility prior to current illness? -- Yes - Recommend PT order No - Consider discontinuing PT/OT              Barriers to discharge:  Disposition Plan:  Residential hospice  Status is: Inpt  Objective: Blood pressure (!) 140/63, pulse 76, temperature 98.4 F (36.9 C), temperature source Oral, resp.  rate 18, height '5\' 11"'$  (1.803 m), weight 86 kg, SpO2 99 %.  Examination:  Physical Exam Constitutional:      Comments: Chronically ill-appearing elderly gentleman lying in bed in no distress;more somnolent today  HENT:     Head: Normocephalic and atraumatic.     Mouth/Throat:     Mouth: Mucous membranes are moist.  Eyes:     Extraocular Movements: Extraocular movements intact.  Cardiovascular:     Rate and Rhythm: Normal rate and regular rhythm.  Pulmonary:     Effort: Pulmonary effort is normal.     Breath sounds: Rhonchi present.  Abdominal:     General: Bowel sounds are normal. There is no distension.     Palpations: Abdomen is soft.     Comments: PEG in place  Musculoskeletal:        General: Normal range of motion.     Cervical back: Normal range of motion and neck supple.  Skin:    General: Skin is warm  and dry.  Neurological:     Mental Status: Mental status is at baseline.     Consultants:    Procedures:    Data Reviewed: Results for orders placed or performed during the hospital encounter of 09/19/22 (from the past 24 hour(s))  Glucose, capillary     Status: Abnormal   Collection Time: 09/24/22  4:12 PM  Result Value Ref Range   Glucose-Capillary 243 (H) 70 - 99 mg/dL  Glucose, capillary     Status: Abnormal   Collection Time: 09/24/22  9:57 PM  Result Value Ref Range   Glucose-Capillary 197 (H) 70 - 99 mg/dL  Glucose, capillary     Status: Abnormal   Collection Time: 09/25/22 12:16 AM  Result Value Ref Range   Glucose-Capillary 206 (H) 70 - 99 mg/dL  Basic metabolic panel     Status: Abnormal   Collection Time: 09/25/22  4:14 AM  Result Value Ref Range   Sodium 144 135 - 145 mmol/L   Potassium 3.7 3.5 - 5.1 mmol/L   Chloride 110 98 - 111 mmol/L   CO2 30 22 - 32 mmol/L   Glucose, Bld 214 (H) 70 - 99 mg/dL   BUN 30 (H) 8 - 23 mg/dL   Creatinine, Ser 0.56 (L) 0.61 - 1.24 mg/dL   Calcium 9.5 8.9 - 10.3 mg/dL   GFR, Estimated >60 >60 mL/min   Anion  gap 4 (L) 5 - 15  CBC with Differential/Platelet     Status: Abnormal   Collection Time: 09/25/22  4:14 AM  Result Value Ref Range   WBC 8.5 4.0 - 10.5 K/uL   RBC 2.96 (L) 4.22 - 5.81 MIL/uL   Hemoglobin 9.0 (L) 13.0 - 17.0 g/dL   HCT 29.0 (L) 39.0 - 52.0 %   MCV 98.0 80.0 - 100.0 fL   MCH 30.4 26.0 - 34.0 pg   MCHC 31.0 30.0 - 36.0 g/dL   RDW 18.6 (H) 11.5 - 15.5 %   Platelets 78 (L) 150 - 400 K/uL   nRBC 0.0 0.0 - 0.2 %   Neutrophils Relative % 33 %   Neutro Abs 2.8 1.7 - 7.7 K/uL   Lymphocytes Relative 50 %   Lymphs Abs 4.3 (H) 0.7 - 4.0 K/uL   Monocytes Relative 16 %   Monocytes Absolute 1.3 (H) 0.1 - 1.0 K/uL   Eosinophils Relative 0 %   Eosinophils Absolute 0.0 0.0 - 0.5 K/uL   Basophils Relative 0 %   Basophils Absolute 0.0 0.0 - 0.1 K/uL   Immature Granulocytes 1 %   Abs Immature Granulocytes 0.05 0.00 - 0.07 K/uL  Magnesium     Status: None   Collection Time: 09/25/22  4:14 AM  Result Value Ref Range   Magnesium 2.0 1.7 - 2.4 mg/dL  Glucose, capillary     Status: Abnormal   Collection Time: 09/25/22  4:20 AM  Result Value Ref Range   Glucose-Capillary 211 (H) 70 - 99 mg/dL  Glucose, capillary     Status: Abnormal   Collection Time: 09/25/22  8:27 AM  Result Value Ref Range   Glucose-Capillary 189 (H) 70 - 99 mg/dL  Glucose, capillary     Status: Abnormal   Collection Time: 09/25/22 11:59 AM  Result Value Ref Range   Glucose-Capillary 222 (H) 70 - 99 mg/dL    I have reviewed pertinent nursing notes, vitals, labs, and images as necessary. I have ordered labwork to follow up on as indicated.  I have reviewed the  last notes from staff over past 24 hours. I have discussed patient's care plan and test results with nursing staff, CM/SW, and other staff as appropriate.  Time spent: Greater than 50% of the 55 minute visit was spent in counseling/coordination of care for the patient as laid out in the A&P.   LOS: 5 days   Dwyane Dee, MD [facility] 09/25/2022, 3:32 PM

## 2022-09-25 NOTE — TOC Progression Note (Signed)
Transition of Care [facility]) - Progression Note    Patient Details  Name: [name] MRN: [id number] Date of Birth: Feb 20, 1933  [facility]) CM/SW Contact  Jinger Neighbors, [place] Phone Number: 09/25/2022, 1:13 PM  Clinical Narrative:     CSW reviewed bed offers and presented to pt's dtr at bedside. Pt's dtr requested [facility], CSW googled [facility] with [name], pt's dtr and informed her its a specialty hospital, which pt does not qualify for. CSW confirmed with Sharyn Lull, [facility] and followed up with [name] to confirm it is in fact an LTAC. [name] reported concerns about not having a portable suction. CSW consulted with the attending RN who has already spoken with [name] about her concerns and met with Furniture conservator/restorer and charge nurse who report there is not a portable suction. RN discussed other ways to meet needs, CSW recommended she speak with the family about alternatives and let them choose, as this appears to be more of an emotional repsonse vs logistics. [name] text pics of the back of pt's mouth. CSW asked her to continue to discuss medical concerns with nurse and SNF with CSW.        Expected Discharge Plan and Services                                               Social Determinants of Health (SDOH) Interventions SDOH Screenings   Food Insecurity: No Food Insecurity (09/23/2022)  Housing: Low Risk  (09/23/2022)  Transportation Needs: No Transportation Needs (09/04/2022)  Utilities: Not At Risk (09/04/2022)  Alcohol Screen: Low Risk  (03/13/2022)  Depression (PHQ2-9): Low Risk  (04/24/2022)  Recent Concern: Depression (PHQ2-9) - High Risk (03/18/2022)  Financial Resource Strain: Low Risk  (08/04/2022)  Physical Activity: Inactive (03/13/2022)  Social Connections: Moderately Isolated (03/13/2022)  Stress: Stress Concern Present (03/13/2022)  Tobacco Use: Medium Risk (09/19/2022)    Readmission Risk Interventions    01/16/2022    2:12 PM  Readmission Risk Prevention Plan   Transportation Screening Complete  Medication Review (RN Care Manager) Referral to Pharmacy  PCP or Specialist appointment within 3-5 days of discharge Complete  HRI or [place] Complete  SW Recovery Care/Counseling Consult Complete  [place] Not Applicable

## 2022-09-25 NOTE — Progress Notes (Signed)
Coude catheter placed with bedside RN. Family at the bedside and aware of procedure. All questions answered. Tashawn Laswell, Rande Brunt, RN

## 2022-09-26 DIAGNOSIS — J9601 Acute respiratory failure with hypoxia: Secondary | ICD-10-CM

## 2022-09-26 DIAGNOSIS — J690 Pneumonitis due to inhalation of food and vomit: Secondary | ICD-10-CM

## 2022-09-26 NOTE — Progress Notes (Signed)
Nutrition Brief Note  Chart reviewed. Pt now transitioning to comfort care.  No further nutrition interventions planned at this time.  Please re-consult as needed.   Ranell Patrick, RD, LDN Clinical Dietitian RD pager # available in [place]  After hours/weekend pager # available in [facility]

## 2022-09-26 NOTE — TOC Transition Note (Signed)
Transition of Care [facility]) - CM/SW Discharge Note   Patient Details  Name: [name] MRN: [id number] Date of Birth: [birth year]  Transition of Care [facility]) CM/SW Contact:  Curlene Labrum, RN Phone Number: 09/26/2022, 8:42 AM   Clinical Narrative:       Final next level of care: Hospice Medical Facility Barriers to Discharge: No Barriers Identified   Patient Goals and CMS Choice CMS Medicare.gov Compare Post Acute Care list provided to:: Patient Represenative (must comment) (wife) Choice offered to / list presented to : Spouse  Discharge Placement  CM called and spoke with [facility] and the patient has been accepted for admission to the facility this morning.  [facility] has been arranged to transport the patient to the facility at 10 am this morning.  The patient's daughter was called and she is aware.  Pending discharge orders and summary by the attending physician at this time.  Bedside nursing - please call report to [facility] at [phone].                       Discharge Plan and Services Additional resources added to the After Visit Summary for     Discharge Planning Services: CM Consult Post Acute Care Choice: Residential Hospice Bed                      [facility] Agency: [place] Date [place]: 09/25/22 Time [place]: 1430 Representative spoke with at [place]: [place] with [facility] [facility] - for [facility], [place], [place])  Social Determinants of Health (SDOH) Interventions SDOH Screenings   Food Insecurity: No Food Insecurity (09/23/2022)  Housing: Low Risk  (09/23/2022)  Transportation Needs: No Transportation Needs (09/04/2022)  Utilities: Not At Risk (09/04/2022)  Alcohol Screen: Low Risk  (03/13/2022)  Depression (PHQ2-9): Low Risk  (04/24/2022)  Recent Concern: Depression (PHQ2-9) - High Risk (03/18/2022)  Financial Resource  Strain: Low Risk  (08/04/2022)  Physical Activity: Inactive (03/13/2022)  Social Connections: Moderately Isolated (03/13/2022)  Stress: Stress Concern Present (03/13/2022)  Tobacco Use: Medium Risk (09/19/2022)     Readmission Risk Interventions    09/25/2022    2:29 PM 01/16/2022    2:12 PM  Readmission Risk Prevention Plan  Transportation Screening Complete Complete  Medication Review (RN Care Manager) Complete Referral to Pharmacy  PCP or Specialist appointment within 3-5 days of discharge Complete Complete  HRI or Home Care Consult Complete Complete  SW Recovery Care/Counseling Consult Complete Complete  Palliative Care Screening Complete Complete  [place] Not Applicable Not Applicable

## 2022-09-26 NOTE — Discharge Summary (Signed)
Physician Discharge Summary   [name] ASN:[id number] DOB: March 04, 1933 DOA: 09/19/2022  PCP: Calvin Norlander, DO  Admit date: 09/19/2022 Discharge date: 09/26/2022 Barriers to discharge: none  Admitted From: Home Disposition:  [name] Discharging physician: Calvin Dee, MD  Recommendations for Outpatient Follow-up:  Continue comfort care   Discharge Condition: poor CODE STATUS: DNR Diet recommendation:  Diet Orders (From admission, onward)     Start     Ordered   09/20/22 0511  Diet NPO time specified  Diet effective now        09/20/22 1937            Hospital Course: [name] is an 89 yo male with PMH COPD, OSA on CPAP, PAF, sCHF, bradycardia s/p PPM who presented with AMS/hypotension.  Upon arrival to [facility] ED on 1/27, patient altered not following commands. Afebrile. BP soft 92/51. Congested cough with rhonchi BS bilaterally. Was placed on NRB with sats 100%. CXR with mild interstitial edema. COVID, flu, RSV negative. LA 2.4 then 3.5. CTA chest/abd/pelvis negative for PE, worsening lower lobe bronchopneumonia suspicious for aspiration, large stool burden likely constipation. Cultures obtained and started on Rocephin/azithromycin. Was given IV fluids and remained hypotensive requiring levo. PCCM consulted for ICU admission.  Assessment and Plan:  Goals of care - as noted above, wife expressed a strong desire for transitioning to hospice when met with her bedside on 2/1. His daughter is also bedside whom agrees with transition and that his QOL has been poor and he continues to slowly decline - in context of this conversation, we will d/c TF and start focusing on comfort - d/c to [facility]   Septic shock, resolved Multifocal pneumonia suspicious for aspiration Patient is off vasopressor support - completed abx inpatient - d/c'd steroids 1/30 Cultures have been negative   Acute septic encephalopathy CVA with residual left side deficits Mental status  has improved some and family has been able to have some conversations with him intermittently these past several days and they are okay still with hospice  CT head was negative for acute findings   Acute Hypoxemic Respiratory Failure due to pneumonia COPD OSA - d/c meds and focus on comfort - O2 okay for comfort at 2L   PAF on eliquis Conduction disease/bradycardia s/p pacemaker placement -Discontinue Eliquis and amiodarone   Chronic combined systolic and diastolic CHF CAD HTN HLD -Continue holding antihypertensives as BP has not needed   Acute kidney injury due to dehydration Serum creatinine is now back to baseline - continue comfort care   T2DM Hemoglobin A1c 6.8 - continue comfort care   Chronic thrombocytopenia Chronic Anemia Platelet and H&H stable   Dysphagia s/p PEG tube placement - d/c tube feeds in setting of comfort care after discussion with daughter and wife   Depression - continue comfort care   Principal Diagnosis: Septic shock [place] [place])  Discharge Diagnoses: Active Hospital Problems   Diagnosis Date Noted   Failure to thrive in adult 04/16/2022    Priority: 2.   Paroxysmal atrial fibrillation ([place]) 06/29/2019    Priority: 3.   COPD (chronic obstructive pulmonary disease) ([place]) 02/21/2014    Priority: 9.   Goals of care, counseling/discussion 01/27/2022    Priority: 11.   Acute respiratory failure with hypoxia (HCC)    Aspiration pneumonia of both lungs due to gastric secretions [facility])     Resolved Hospital Problems   Diagnosis Date Noted Date Resolved   Septic shock ([place]) 09/20/2022 09/25/2022  Priority: 1.   Altered mental status 09/20/2022 09/25/2022   Multifocal pneumonia 09/20/2022 09/25/2022     Discharge Instructions     Discharge wound care:   Complete by: As directed    Continue comfort care      Allergies as of 09/26/2022       Reactions   Penicillin V Potassium Other (See Comments)   Penicillins Other (See Comments)    Unknown reaction -- Tolerated Augmentin courses 2019, 2023; Tolerates cephalosporins    Ultram [tramadol] Other (See Comments)   Dizziness   Ketorolac Tromethamine Rash        Medication List     STOP taking these medications    acetaminophen 160 MG/5ML solution Commonly known as: TYLENOL   amiodarone 200 MG tablet Commonly known as: PACERONE   apixaban 5 MG Tabs tablet Commonly known as: Eliquis   azelastine 0.1 % nasal spray Commonly known as: ASTELIN   budesonide-formoterol 160-4.5 MCG/ACT inhaler Commonly known as: SYMBICORT   D3 2000 PO   diclofenac Sodium 1 % Gel Commonly known as: Voltaren   doxazosin 1 MG tablet Commonly known as: CARDURA   famotidine 20 MG tablet Commonly known as: PEPCID   feeding supplement (JEVITY 1.5 CAL/FIBER) Liqd   ferrous gluconate 240 (27 FE) MG tablet Commonly known as: FERGON   fluconazole 150 MG tablet Commonly known as: Diflucan   free water Soln   ipratropium-albuterol 0.5-2.5 (3) MG/3ML Soln Commonly known as: DUONEB   lidocaine 5 % Commonly known as: Lidoderm   losartan 25 MG tablet Commonly known as: COZAAR   melatonin 5 MG Tabs   metFORMIN 500 MG tablet Commonly known as: GLUCOPHAGE   mirtazapine 15 MG tablet Commonly known as: REMERON   nitrofurantoin 50 MG capsule Commonly known as: MACRODANTIN   nutrition supplement (JUVEN) Pack   ondansetron 8 MG disintegrating tablet Commonly known as: ZOFRAN-ODT   pantoprazole 20 MG tablet Commonly known as: PROTONIX   Phosphorus w/Sod & Potassium 280-160-250 MG Pack   predniSONE 10 MG (21) Tbpk tablet Commonly known as: STERAPRED UNI-PAK 21 TAB   predniSONE 10 MG tablet Commonly known as: DELTASONE   PREVAGEN PO   rosuvastatin 40 MG tablet Commonly known as: CRESTOR   Triple Paste 12.8 % ointment Generic drug: Zinc Oxide               Discharge Care Instructions  (From admission, onward)           Start     Ordered   09/26/22  0000  Discharge wound care:       Comments: Continue comfort care   09/26/22 4481            Allergies  Allergen Reactions   Penicillin V Potassium Other (See Comments)   Penicillins Other (See Comments)    Unknown reaction -- Tolerated Augmentin courses 2019, 2023; Tolerates cephalosporins    Ultram [Tramadol] Other (See Comments)    Dizziness   Ketorolac Tromethamine Rash    Discharge Exam: BP 132/60   Pulse 60   Temp 98.2 F (36.8 C) (Oral)   Resp 16   Ht '5\' 11"'$  (1.803 m)   Wt 86 kg   SpO2 100%   BMI 26.44 kg/m  Physical Exam Constitutional:      Comments: Chronically ill-appearing elderly gentleman lying in bed in no distress;more somnolent today  HENT:     Head: Normocephalic and atraumatic.     Mouth/Throat:     Mouth: Mucous membranes  are moist.  Eyes:     Extraocular Movements: Extraocular movements intact.  Cardiovascular:     Rate and Rhythm: Normal rate and regular rhythm.  Pulmonary:     Effort: Pulmonary effort is normal.     Breath sounds: Rhonchi present.  Abdominal:     General: Bowel sounds are normal. There is no distension.     Palpations: Abdomen is soft.     Comments: PEG in place  Musculoskeletal:        General: Normal range of motion.     Cervical back: Normal range of motion and neck supple.  Skin:    General: Skin is warm and dry.  Neurological:     Mental Status: Mental status is at baseline.      The results of significant diagnostics from this hospitalization (including imaging, microbiology, ancillary and laboratory) are listed below for reference.   Microbiology: Recent Results (from the past 240 hour(s))  Resp panel by RT-PCR (RSV, Flu A&B, Covid) Anterior Nasal Swab     Status: None   Collection Time: 09/20/22 12:05 AM   Specimen: Anterior Nasal Swab  Result Value Ref Range Status   SARS Coronavirus 2 by RT PCR NEGATIVE NEGATIVE Final    Comment: (NOTE) SARS-CoV-2 target nucleic acids are NOT DETECTED.  The  SARS-CoV-2 RNA is generally detectable in upper respiratory specimens during the acute phase of infection. The lowest concentration of SARS-CoV-2 viral copies this assay can detect is 138 copies/mL. A negative result does not preclude SARS-Cov-2 infection and should not be used as the sole basis for treatment or other patient management decisions. A negative result may occur with  improper specimen collection/handling, submission of specimen other than nasopharyngeal swab, presence of viral mutation(s) within the areas targeted by this assay, and inadequate number of viral copies(<138 copies/mL). A negative result must be combined with clinical observations, patient history, and epidemiological information. The expected result is Negative.  Fact Sheet for Patients:  EntrepreneurPulse.com.au  Fact Sheet for Healthcare Providers:  IncredibleEmployment.be  This test is no t yet approved or cleared by the [place] FDA and  has been authorized for detection and/or diagnosis of SARS-CoV-2 by FDA under an Emergency Use Authorization (EUA). This EUA will remain  in effect (meaning this test can be used) for the duration of the COVID-19 declaration under Section 564(b)(1) of the Act, 21 U.S.C.section 360bbb-3(b)(1), unless the authorization is terminated  or revoked sooner.       Influenza A by PCR NEGATIVE NEGATIVE Final   Influenza B by PCR NEGATIVE NEGATIVE Final    Comment: (NOTE) The Xpert Xpress SARS-CoV-2/FLU/RSV plus assay is intended as an aid in the diagnosis of influenza from Nasopharyngeal swab specimens and should not be used as a sole basis for treatment. Nasal washings and aspirates are unacceptable for Xpert Xpress SARS-CoV-2/FLU/RSV testing.  Fact Sheet for Patients: EntrepreneurPulse.com.au  Fact Sheet for Healthcare Providers: IncredibleEmployment.be  This test is not yet approved or  cleared by the [place] FDA and has been authorized for detection and/or diagnosis of SARS-CoV-2 by FDA under an Emergency Use Authorization (EUA). This EUA will remain in effect (meaning this test can be used) for the duration of the COVID-19 declaration under Section 564(b)(1) of the Act, 21 U.S.C. section 360bbb-3(b)(1), unless the authorization is terminated or revoked.     Resp Syncytial Virus by PCR NEGATIVE NEGATIVE Final    Comment: (NOTE) Fact Sheet for Patients: EntrepreneurPulse.com.au  Fact Sheet for Healthcare Providers: IncredibleEmployment.be  This test is not yet approved or cleared by the [place] and has been authorized for detection and/or diagnosis of SARS-CoV-2 by FDA under an Emergency Use Authorization (EUA). This EUA will remain in effect (meaning this test can be used) for the duration of the COVID-19 declaration under Section 564(b)(1) of the Act, 21 U.S.C. section 360bbb-3(b)(1), unless the authorization is terminated or revoked.  Performed at [facility], [place] [address] [place], [place] [address]   Blood Culture (routine x 2)     Status: None   Collection Time: 09/20/22 12:20 AM   Specimen: BLOOD  Result Value Ref Range Status   Specimen Description BLOOD LEFT ANTECUBITAL  Final   Special Requests   Final    BOTTLES DRAWN AEROBIC AND ANAEROBIC Blood Culture adequate volume   Culture   Final    NO GROWTH 5 DAYS Performed at [facility], [place] [address], [place], [place] [address]    Report Status 09/25/2022 FINAL  Final  Blood Culture (routine x 2)     Status: None   Collection Time: 09/20/22 12:28 AM   Specimen: BLOOD LEFT HAND  Result Value Ref Range Status   Specimen Description BLOOD LEFT HAND  Final   Special Requests   Final    BOTTLES DRAWN AEROBIC AND ANAEROBIC Blood Culture adequate volume   Culture   Final    NO GROWTH 5 DAYS Performed at [facility], [place] [address], [place], [place] [address]    Report Status 09/25/2022 FINAL  Final  Urine Culture (for pregnant, neutropenic or urologic patients or patients with an indwelling urinary catheter)     Status: Abnormal   Collection Time: 09/20/22  5:12 AM   Specimen: Urine, Clean Catch  Result Value Ref Range Status   Specimen Description URINE, CLEAN CATCH  Final   Special Requests   Final    NONE Performed at [facility], [place] [address], [place], [place] [address]    Culture MULTIPLE SPECIES PRESENT, SUGGEST RECOLLECTION (A)  Final   Report Status 09/21/2022 FINAL  Final  MRSA Next Gen by PCR, Nasal     Status: None   Collection Time: 09/20/22  5:13 AM   Specimen: Nasal Mucosa; Nasal Swab  Result Value Ref Range Status   MRSA by PCR Next Gen NOT DETECTED NOT DETECTED Final    Comment: (NOTE) The GeneXpert MRSA Assay (FDA approved for NASAL specimens only), is one component of a comprehensive MRSA colonization surveillance program. It is not intended to diagnose MRSA infection nor to guide or monitor treatment for MRSA infections. Test performance is not FDA approved in patients less than 3 years old. Performed at [facility], [place] [address], [place], [place] [address]   Respiratory (~20 pathogens) panel by PCR     Status: None   Collection Time: 09/20/22  5:19 AM   Specimen: Nasopharyngeal Swab; Respiratory  Result Value Ref Range Status   Adenovirus NOT DETECTED NOT DETECTED Final   Coronavirus 229E NOT DETECTED NOT DETECTED Final    Comment: (NOTE) The Coronavirus on the Respiratory Panel, DOES NOT test for the novel  Coronavirus (2019 nCoV)    Coronavirus HKU1 NOT DETECTED NOT DETECTED Final   Coronavirus NL63 NOT DETECTED NOT DETECTED Final   Coronavirus OC43 NOT DETECTED NOT DETECTED Final   Metapneumovirus NOT DETECTED NOT DETECTED Final   Rhinovirus / Enterovirus NOT DETECTED NOT DETECTED Final   Influenza A  NOT DETECTED NOT DETECTED Final   Influenza B NOT  DETECTED NOT DETECTED Final   Parainfluenza Virus 1 NOT DETECTED NOT DETECTED Final   Parainfluenza Virus 2 NOT DETECTED NOT DETECTED Final   Parainfluenza Virus 3 NOT DETECTED NOT DETECTED Final   Parainfluenza Virus 4 NOT DETECTED NOT DETECTED Final   Respiratory Syncytial Virus NOT DETECTED NOT DETECTED Final   Bordetella pertussis NOT DETECTED NOT DETECTED Final   Bordetella Parapertussis NOT DETECTED NOT DETECTED Final   Chlamydophila pneumoniae NOT DETECTED NOT DETECTED Final   Mycoplasma pneumoniae NOT DETECTED NOT DETECTED Final    Comment: Performed at [facility], [place] [address], [place], [place] [address]     Labs: BNP (last 3 results) Recent Labs    08/01/22 1630 09/20/22 0020  BNP 69.3 324.4*   Basic Metabolic Panel: Recent Labs  Lab 09/20/22 0540 09/20/22 1814 09/20/22 2144 09/21/22 0010 09/21/22 1658 09/22/22 0114 09/22/22 0839 09/23/22 0218 09/24/22 0816 09/25/22 0414  NA  --   --    < > 144  --   --  142 142 143 144  K  --   --    < > 4.2  --   --  3.0* 3.6 3.6 3.7  CL  --   --    < > 111  --   --  108 112* 111 110  CO2  --   --    < > 24  --   --  '25 23 26 30  '$ GLUCOSE  --   --    < > 183*  --   --  240* 177* 199* 214*  BUN  --   --    < > 44*  --   --  45* 39* 33* 30*  CREATININE  --   --    < > 0.73  --   --  0.61 0.66 0.69 0.56*  CALCIUM  --   --    < > 9.6  --   --  9.8 9.7 9.4 9.5  MG 2.0 1.9  --  2.2 2.1 2.1  --   --  1.9 2.0  PHOS 3.0 2.6  --  2.8 1.9* 3.3  --   --   --   --    < > = values in this interval not displayed.   Liver Function Tests: Recent Labs  Lab 09/20/22 0020  AST 19  ALT 19  ALKPHOS 57  BILITOT 0.6  PROT 5.2*  ALBUMIN 2.4*   No results for input(s): "LIPASE", "AMYLASE" in the last 168 hours. Recent Labs  Lab 09/20/22 0540  AMMONIA <10   CBC: Recent Labs  Lab 09/20/22 0020 09/21/22 0010 09/24/22 0816 09/25/22 0414  WBC 18.2* 18.3* 8.3 8.5  NEUTROABS 7.3  --  2.2 2.8  HGB 10.8* 9.5* 8.4* 9.0*  HCT  35.6* 30.7* 27.6* 29.0*  MCV 100.6* 100.0 97.9 98.0  PLT 110* 103* 68* 78*   Cardiac Enzymes: No results for input(s): "CKTOTAL", "CKMB", "CKMBINDEX", "TROPONINI" in the last 168 hours. BNP: Invalid input(s): "POCBNP" CBG: Recent Labs  Lab 09/24/22 2157 09/25/22 0016 09/25/22 0420 09/25/22 0827 09/25/22 1159  GLUCAP 197* 206* 211* 189* 222*   D-Dimer No results for input(s): "DDIMER" in the last 72 hours. Hgb A1c No results for input(s): "HGBA1C" in the last 72 hours. Lipid Profile No results for input(s): "CHOL", "HDL", "LDLCALC", "TRIG", "CHOLHDL", "LDLDIRECT" in the last 72 hours. Thyroid function studies No results for  input(s): "TSH", "T4TOTAL", "T3FREE", "THYROIDAB" in the last 72 hours.  Invalid input(s): "FREET3" Anemia work up No results for input(s): "VITAMINB12", "FOLATE", "FERRITIN", "TIBC", "IRON", "RETICCTPCT" in the last 72 hours. Urinalysis    Component Value Date/Time   COLORURINE YELLOW 09/20/2022 0545   APPEARANCEUR HAZY (A) 09/20/2022 0545   APPEARANCEUR Cloudy (A) 03/18/2022 1359   LABSPEC 1.039 (H) 09/20/2022 0545   PHURINE 6.0 09/20/2022 0545   GLUCOSEU NEGATIVE 09/20/2022 0545   HGBUR SMALL (A) 09/20/2022 0545   BILIRUBINUR NEGATIVE 09/20/2022 0545   BILIRUBINUR Negative 03/18/2022 1359   KETONESUR NEGATIVE 09/20/2022 0545   PROTEINUR 30 (A) 09/20/2022 0545   UROBILINOGEN 1.0 03/28/2014 1208   NITRITE NEGATIVE 09/20/2022 0545   LEUKOCYTESUR SMALL (A) 09/20/2022 0545   Sepsis Labs Recent Labs  Lab 09/20/22 0020 09/21/22 0010 09/24/22 0816 09/25/22 0414  WBC 18.2* 18.3* 8.3 8.5   Microbiology Recent Results (from the past 240 hour(s))  Resp panel by RT-PCR (RSV, Flu A&B, Covid) Anterior Nasal Swab     Status: None   Collection Time: 09/20/22 12:05 AM   Specimen: Anterior Nasal Swab  Result Value Ref Range Status   SARS Coronavirus 2 by RT PCR NEGATIVE NEGATIVE Final    Comment: (NOTE) SARS-CoV-2 target nucleic acids are NOT  DETECTED.  The SARS-CoV-2 RNA is generally detectable in upper respiratory specimens during the acute phase of infection. The lowest concentration of SARS-CoV-2 viral copies this assay can detect is 138 copies/mL. A negative result does not preclude SARS-Cov-2 infection and should not be used as the sole basis for treatment or other patient management decisions. A negative result may occur with  improper specimen collection/handling, submission of specimen other than nasopharyngeal swab, presence of viral mutation(s) within the areas targeted by this assay, and inadequate number of viral copies(<138 copies/mL). A negative result must be combined with clinical observations, patient history, and epidemiological information. The expected result is Negative.  Fact Sheet for Patients:  EntrepreneurPulse.com.au  Fact Sheet for Healthcare Providers:  IncredibleEmployment.be  This test is no t yet approved or cleared by the [place] FDA and  has been authorized for detection and/or diagnosis of SARS-CoV-2 by FDA under an Emergency Use Authorization (EUA). This EUA will remain  in effect (meaning this test can be used) for the duration of the COVID-19 declaration under Section 564(b)(1) of the Act, 21 U.S.C.section 360bbb-3(b)(1), unless the authorization is terminated  or revoked sooner.       Influenza A by PCR NEGATIVE NEGATIVE Final   Influenza B by PCR NEGATIVE NEGATIVE Final    Comment: (NOTE) The Xpert Xpress SARS-CoV-2/FLU/RSV plus assay is intended as an aid in the diagnosis of influenza from Nasopharyngeal swab specimens and should not be used as a sole basis for treatment. Nasal washings and aspirates are unacceptable for Xpert Xpress SARS-CoV-2/FLU/RSV testing.  Fact Sheet for Patients: EntrepreneurPulse.com.au  Fact Sheet for Healthcare Providers: IncredibleEmployment.be  This test is not yet  approved or cleared by the [place] FDA and has been authorized for detection and/or diagnosis of SARS-CoV-2 by FDA under an Emergency Use Authorization (EUA). This EUA will remain in effect (meaning this test can be used) for the duration of the COVID-19 declaration under Section 564(b)(1) of the Act, 21 U.S.C. section 360bbb-3(b)(1), unless the authorization is terminated or revoked.     Resp Syncytial Virus by PCR NEGATIVE NEGATIVE Final    Comment: (NOTE) Fact Sheet for Patients: EntrepreneurPulse.com.au  Fact Sheet for Healthcare Providers: IncredibleEmployment.be  This  test is not yet approved or cleared by the [place] and has been authorized for detection and/or diagnosis of SARS-CoV-2 by FDA under an Emergency Use Authorization (EUA). This EUA will remain in effect (meaning this test can be used) for the duration of the COVID-19 declaration under Section 564(b)(1) of the Act, 21 U.S.C. section 360bbb-3(b)(1), unless the authorization is terminated or revoked.  Performed at [facility], [place] [address] [place], [place] [address]   Blood Culture (routine x 2)     Status: None   Collection Time: 09/20/22 12:20 AM   Specimen: BLOOD  Result Value Ref Range Status   Specimen Description BLOOD LEFT ANTECUBITAL  Final   Special Requests   Final    BOTTLES DRAWN AEROBIC AND ANAEROBIC Blood Culture adequate volume   Culture   Final    NO GROWTH 5 DAYS Performed at [facility], [place] [address], [place], [place] [address]    Report Status 09/25/2022 FINAL  Final  Blood Culture (routine x 2)     Status: None   Collection Time: 09/20/22 12:28 AM   Specimen: BLOOD LEFT HAND  Result Value Ref Range Status   Specimen Description BLOOD LEFT HAND  Final   Special Requests   Final    BOTTLES DRAWN AEROBIC AND ANAEROBIC Blood Culture adequate volume   Culture   Final    NO GROWTH 5 DAYS Performed at [place], [place] [address], [place], [place] [address]    Report Status 09/25/2022 FINAL  Final  Urine Culture (for pregnant, neutropenic or urologic patients or patients with an indwelling urinary catheter)     Status: Abnormal   Collection Time: 09/20/22  5:12 AM   Specimen: Urine, Clean Catch  Result Value Ref Range Status   Specimen Description URINE, CLEAN CATCH  Final   Special Requests   Final    NONE Performed at [facility], [place] [address], [place], [place] [address]    Culture MULTIPLE SPECIES PRESENT, SUGGEST RECOLLECTION (A)  Final   Report Status 09/21/2022 FINAL  Final  MRSA Next Gen by PCR, Nasal     Status: None   Collection Time: 09/20/22  5:13 AM   Specimen: Nasal Mucosa; Nasal Swab  Result Value Ref Range Status   MRSA by PCR Next Gen NOT DETECTED NOT DETECTED Final    Comment: (NOTE) The GeneXpert MRSA Assay (FDA approved for NASAL specimens only), is one component of a comprehensive MRSA colonization surveillance program. It is not intended to diagnose MRSA infection nor to guide or monitor treatment for MRSA infections. Test performance is not FDA approved in patients less than 86 years old. Performed at [facility], [place] [address] [place], [place] [address]   Respiratory (~20 pathogens) panel by PCR     Status: None   Collection Time: 09/20/22  5:19 AM   Specimen: Nasopharyngeal Swab; Respiratory  Result Value Ref Range Status   Adenovirus NOT DETECTED NOT DETECTED Final   Coronavirus 229E NOT DETECTED NOT DETECTED Final    Comment: (NOTE) The Coronavirus on the Respiratory Panel, DOES NOT test for the novel  Coronavirus (2019 nCoV)    Coronavirus HKU1 NOT DETECTED NOT DETECTED Final   Coronavirus NL63 NOT DETECTED NOT DETECTED Final   Coronavirus OC43 NOT DETECTED NOT DETECTED Final   Metapneumovirus NOT DETECTED NOT DETECTED Final   Rhinovirus / Enterovirus NOT DETECTED NOT DETECTED Final   Influenza A NOT  DETECTED NOT DETECTED Final   Influenza  B NOT DETECTED NOT DETECTED Final   Parainfluenza Virus 1 NOT DETECTED NOT DETECTED Final   Parainfluenza Virus 2 NOT DETECTED NOT DETECTED Final   Parainfluenza Virus 3 NOT DETECTED NOT DETECTED Final   Parainfluenza Virus 4 NOT DETECTED NOT DETECTED Final   Respiratory Syncytial Virus NOT DETECTED NOT DETECTED Final   Bordetella pertussis NOT DETECTED NOT DETECTED Final   Bordetella Parapertussis NOT DETECTED NOT DETECTED Final   Chlamydophila pneumoniae NOT DETECTED NOT DETECTED Final   Mycoplasma pneumoniae NOT DETECTED NOT DETECTED Final    Comment: Performed at [facility], [place] [address], [place], [place] [address]    Procedures/Studies: CT HEAD WO CONTRAST (5MM)  Result Date: 09/20/2022 CLINICAL DATA:  89 year old male with altered mental status. EXAM: CT HEAD WITHOUT CONTRAST TECHNIQUE: Contiguous axial images were obtained from the base of the skull through the vertex without intravenous contrast. RADIATION DOSE REDUCTION: This exam was performed according to the departmental dose-optimization program which includes automated exposure control, adjustment of the mA and/or kV according to patient size and/or use of iterative reconstruction technique. COMPARISON:  Brain MRI 03/28/2006.  Head CT 01/06/2022. FINDINGS: Brain: Stable cerebral volume from last year. Stable ventricle size and configuration. No midline shift, mass effect, or evidence of intracranial mass lesion. No acute intracranial hemorrhage identified. Small chronic appearing inferior cerebellar infarcts are stable. Stable gray-white matter differentiation throughout the brain. No cortically based acute infarct identified. Vascular: Calcified atherosclerosis at the skull base. No suspicious intracranial vascular hyperdensity. Skull: No acute osseous abnormality identified. Sinuses/Orbits: New opacified sphenoid sinuses, more so on the left. Right sphenoid bubbly opacity. Incidental superimposed hyperplastic Onodi cells.  Other visualized paranasal sinuses and mastoids are stable and well aerated. Other: No acute orbit or scalp soft tissue finding. Small volume retained secretions in the visible oropharynx. IMPRESSION: 1. No acute intracranial abnormality. Stable since last year non contrast CT appearance of brain volume loss and chronic small vessel disease. 2. New bilateral sphenoid sinus inflammation, opacification. 3. Small volume retained secretions in the visible oropharynx. Electronically Signed   By: Genevie Ann M.D.   On: 09/20/2022 11:45   CT Angio Chest PE W and/or Wo Contrast  Result Date: 09/20/2022 CLINICAL DATA:  Respiratory distress and unresponsiveness. Pulmonary embolism suspected. EXAM: CT ANGIOGRAPHY CHEST CT ABDOMEN AND PELVIS WITH CONTRAST TECHNIQUE: Multidetector CT imaging of the chest was performed using the standard protocol during bolus administration of intravenous contrast. Multiplanar CT image reconstructions and MIPs were obtained to evaluate the vascular anatomy. Multidetector CT imaging of the abdomen and pelvis was performed using the standard protocol during bolus administration of intravenous contrast. RADIATION DOSE REDUCTION: This exam was performed according to the departmental dose-optimization program which includes automated exposure control, adjustment of the mA and/or kV according to patient size and/or use of iterative reconstruction technique. CONTRAST:  61m OMNIPAQUE IOHEXOL 350 MG/ML SOLN COMPARISON:  CTA chest 03/24/2022 and CT abdomen and pelvis 03/24/2022 FINDINGS: CTA CHEST FINDINGS Cardiovascular: Satisfactory opacification of the pulmonary arteries to the segmental level. No evidence of pulmonary embolism. Cardiomegaly. No pericardial effusion. Coronary artery and aortic atherosclerotic calcification. Left chest wall pacemaker. Sternotomy and CABG. Mediastinum/Nodes: No thoracic adenopathy by size. Unremarkable thyroid and esophagus. Lungs/Pleura: Bronchial wall thickening  greatest in the lower lobes with associated mucous plugging. Centrilobular micro nodularity and tree-in-bud opacities greatest in the lower lobes. No pleural effusion or pneumothorax. Musculoskeletal: Thoracolumbar spondylosis.  No acute fracture. Review of the  MIP images confirms the above findings. CT ABDOMEN and PELVIS FINDINGS Hepatobiliary: No focal liver abnormality is seen. Status post cholecystectomy. No biliary dilatation. Pancreas: Unremarkable. No pancreatic ductal dilatation or surrounding inflammatory changes. Spleen: Unchanged borderline splenomegaly. Unchanged hemangioma in the splenic dome. Adrenals/Urinary Tract: Normal adrenal glands. Bilateral cortical renal scarring. Low-attenuation lesions in the kidneys are statistically likely to represent cysts. No follow-up is required. Nonobstructing nephrolithiasis versus parenchymal calcification bilaterally. No hydronephrosis. Unremarkable bladder. Stomach/Bowel: Gastrostomy tube with balloon in the stomach and tip in the jejunum. Normal caliber large and small bowel. Large colonic stool burden. Question mild rectal wall thickening. Normal appendix. Vascular/Lymphatic: Advanced aortic calcification. Aorto bi-iliac bypass graft is patent. 2.1 cm left common iliac artery aneurysm, unchanged. Reproductive: Unremarkable. Other: Trace free fluid in the pelvis. No free intraperitoneal air. Bilateral fat containing inguinal hernias. Musculoskeletal: Healing fractures of the right sacral ala and right superior and inferior pubic rami. No acute fracture. Review of the MIP images confirms the above findings. IMPRESSION: 1. Negative for acute pulmonary embolism. 2. Interval worsening of lower lobe predominant bronchopneumonia in a distribution suspicious for aspiration. 3. Large colonic stool burden.  Correlate for constipation. 4. Possible mild proctitis. 5.  Aortic Atherosclerosis (ICD10-I70.0). Electronically Signed   By: Placido Sou M.D.   On: 09/20/2022  03:29   CT ABDOMEN PELVIS W CONTRAST  Result Date: 09/20/2022 CLINICAL DATA:  Respiratory distress and unresponsiveness. Pulmonary embolism suspected. EXAM: CT ANGIOGRAPHY CHEST CT ABDOMEN AND PELVIS WITH CONTRAST TECHNIQUE: Multidetector CT imaging of the chest was performed using the standard protocol during bolus administration of intravenous contrast. Multiplanar CT image reconstructions and MIPs were obtained to evaluate the vascular anatomy. Multidetector CT imaging of the abdomen and pelvis was performed using the standard protocol during bolus administration of intravenous contrast. RADIATION DOSE REDUCTION: This exam was performed according to the departmental dose-optimization program which includes automated exposure control, adjustment of the mA and/or kV according to patient size and/or use of iterative reconstruction technique. CONTRAST:  68m OMNIPAQUE IOHEXOL 350 MG/ML SOLN COMPARISON:  CTA chest 03/24/2022 and CT abdomen and pelvis 03/24/2022 FINDINGS: CTA CHEST FINDINGS Cardiovascular: Satisfactory opacification of the pulmonary arteries to the segmental level. No evidence of pulmonary embolism. Cardiomegaly. No pericardial effusion. Coronary artery and aortic atherosclerotic calcification. Left chest wall pacemaker. Sternotomy and CABG. Mediastinum/Nodes: No thoracic adenopathy by size. Unremarkable thyroid and esophagus. Lungs/Pleura: Bronchial wall thickening greatest in the lower lobes with associated mucous plugging. Centrilobular micro nodularity and tree-in-bud opacities greatest in the lower lobes. No pleural effusion or pneumothorax. Musculoskeletal: Thoracolumbar spondylosis.  No acute fracture. Review of the MIP images confirms the above findings. CT ABDOMEN and PELVIS FINDINGS Hepatobiliary: No focal liver abnormality is seen. Status post cholecystectomy. No biliary dilatation. Pancreas: Unremarkable. No pancreatic ductal dilatation or surrounding inflammatory changes. Spleen:  Unchanged borderline splenomegaly. Unchanged hemangioma in the splenic dome. Adrenals/Urinary Tract: Normal adrenal glands. Bilateral cortical renal scarring. Low-attenuation lesions in the kidneys are statistically likely to represent cysts. No follow-up is required. Nonobstructing nephrolithiasis versus parenchymal calcification bilaterally. No hydronephrosis. Unremarkable bladder. Stomach/Bowel: Gastrostomy tube with balloon in the stomach and tip in the jejunum. Normal caliber large and small bowel. Large colonic stool burden. Question mild rectal wall thickening. Normal appendix. Vascular/Lymphatic: Advanced aortic calcification. Aorto bi-iliac bypass graft is patent. 2.1 cm left common iliac artery aneurysm, unchanged. Reproductive: Unremarkable. Other: Trace free fluid in the pelvis. No free intraperitoneal air. Bilateral fat containing inguinal hernias. Musculoskeletal: Healing fractures of the right sacral ala  and right superior and inferior pubic rami. No acute fracture. Review of the MIP images confirms the above findings. IMPRESSION: 1. Negative for acute pulmonary embolism. 2. Interval worsening of lower lobe predominant bronchopneumonia in a distribution suspicious for aspiration. 3. Large colonic stool burden.  Correlate for constipation. 4. Possible mild proctitis. 5.  Aortic Atherosclerosis (ICD10-I70.0). Electronically Signed   By: Placido Sou M.D.   On: 09/20/2022 03:29   DG Chest Port 1 View  Result Date: 09/20/2022 CLINICAL DATA:  Questionable sepsis-evaluate for abnormality. Respiratory distress and unresponsiveness. EXAM: PORTABLE CHEST 1 VIEW COMPARISON:  08/01/2022 FINDINGS: Stable cardiomegaly. Aortic atherosclerotic calcification. Left chest wall pacemaker. Sternotomy and CABG. Telemetry leads overlie the chest. Low lung volumes. Similar coarsening of the interstitium. Bibasilar atelectasis. No definite pleural effusion. No pneumothorax. Advanced arthritis left shoulder with  superior subluxation of the humeral head in relation to the glenoid, unchanged. IMPRESSION: Cardiomegaly.  Possible mild interstitial edema. Electronically Signed   By: Placido Sou M.D.   On: 09/20/2022 00:34   CUP PACEART REMOTE DEVICE CHECK  Result Date: 09/12/2022 Scheduled remote reviewed. Normal device function.  Next remote 91 days. [place]  IR GJ Tube Change  Result Date: 08/28/2022 INDICATION: 89 year old gentleman with dislodged GJ tube returns to interventional radiology for replacement. EXAM: Fluoroscopy guided GJ tube re-insertion MEDICATIONS: None ANESTHESIA/SEDATION: None CONTRAST:  23 mL of Omnipaque 300-administered into the gastric lumen. FLUOROSCOPY: Radiation Exposure Index (as provided by the fluoroscopic device): 66 mGy Kerma COMPLICATIONS: None immediate. PROCEDURE: Informed written consent was obtained from the patient after a thorough discussion of the procedural risks, benefits and alternatives. All questions were addressed. Maximal Sterile Barrier Technique was utilized including caps, mask, sterile gowns, sterile gloves, sterile drape, hand hygiene and skin antiseptic. A timeout was performed prior to the initiation of the procedure. Skin surrounding the GJ tube insertion site was prepped and draped in usual sterile fashion. Kumpe catheter was advanced through the existing ostomy to the proximal jejunum utilizing fluoroscopic guidance. Contrast administered through the Kumpe catheter confirmed appropriate positioning within the small bowel lumen. Kumpe catheter was removed over 0.035 inch stiff glidewire. 24 French GJ tube was inserted over the stiff glidewire to the level of the proximal jejunum. Contrast administered through the gastric and jejunal limbs confirmed appropriate positioning. The balloon was inflated with 10 mL of dilute contrast and retracted to the anterior gastric wall. All lumens were flushed with normal saline. IMPRESSION: Successful re-insertion of the 8 French  GJ tube. Electronically Signed   By: Miachel Roux M.D.   On: 08/28/2022 15:58     Time coordinating discharge: Over 30 minutes    Calvin Dee, MD  [facility] 09/26/2022, 9:23 AM

## 2022-09-29 NOTE — Progress Notes (Signed)
Remote pacemaker transmission.   

## 2022-10-06 ENCOUNTER — Ambulatory Visit: Payer: Self-pay | Admitting: *Deleted

## 2022-10-06 NOTE — Patient Outreach (Signed)
   Care Coordination    Follow Up Visit Note     07/29/2022 Name: [name]         MRN: [id number]       DOB: March 06, 1933   [name] is a 89 y.o. year old male ws noted to be decease with review of EPIC assigned patients   Case closure deceased 10/07/2022  Encounter Outcome: case closure   Michaelangelo Mittelman L. Lavina Hamman, RN, BSN, [place] Coordinator Office number [phone]

## 2022-10-17 ENCOUNTER — Ambulatory Visit: Payer: Medicare Other | Admitting: Nurse Practitioner

## 2022-10-22 ENCOUNTER — Ambulatory Visit: Payer: Medicare Other | Admitting: Nurse Practitioner

## 2022-10-24 DEATH — deceased

## 2022-10-29 ENCOUNTER — Other Ambulatory Visit (HOSPITAL_COMMUNITY): Payer: Self-pay

## 2022-12-12 ENCOUNTER — Ambulatory Visit: Payer: Medicare Other

## 2023-01-30 ENCOUNTER — Other Ambulatory Visit (HOSPITAL_COMMUNITY): Payer: Medicare Other

## 2023-03-13 ENCOUNTER — Ambulatory Visit: Payer: Medicare Other

## 2023-03-27 ENCOUNTER — Encounter: Payer: Medicare Other | Admitting: Cardiovascular Disease
